# Patient Record
Sex: Female | Born: 1935
Health system: Southern US, Community
[De-identification: ages and names within clinical notes are randomized; demographics above are authoritative.]

## PROBLEM LIST (undated history)

## (undated) ENCOUNTER — Emergency Department (HOSPITAL_BASED_OUTPATIENT_CLINIC_OR_DEPARTMENT_OTHER): Admission: EM | Payer: Medicare Other | Source: Home / Self Care

## (undated) DIAGNOSIS — I272 Pulmonary hypertension, unspecified: Secondary | ICD-10-CM

## (undated) DIAGNOSIS — I10 Essential (primary) hypertension: Secondary | ICD-10-CM

## (undated) DIAGNOSIS — M8580 Other specified disorders of bone density and structure, unspecified site: Secondary | ICD-10-CM

## (undated) DIAGNOSIS — E669 Obesity, unspecified: Secondary | ICD-10-CM

## (undated) DIAGNOSIS — IMO0001 Reserved for inherently not codable concepts without codable children: Secondary | ICD-10-CM

## (undated) DIAGNOSIS — E039 Hypothyroidism, unspecified: Secondary | ICD-10-CM

## (undated) DIAGNOSIS — I2510 Atherosclerotic heart disease of native coronary artery without angina pectoris: Secondary | ICD-10-CM

## (undated) DIAGNOSIS — Z5189 Encounter for other specified aftercare: Secondary | ICD-10-CM

## (undated) DIAGNOSIS — I50810 Right heart failure, unspecified: Secondary | ICD-10-CM

## (undated) DIAGNOSIS — K7581 Nonalcoholic steatohepatitis (NASH): Secondary | ICD-10-CM

## (undated) DIAGNOSIS — K7460 Unspecified cirrhosis of liver: Secondary | ICD-10-CM

## (undated) DIAGNOSIS — E785 Hyperlipidemia, unspecified: Secondary | ICD-10-CM

## (undated) DIAGNOSIS — E049 Nontoxic goiter, unspecified: Secondary | ICD-10-CM

## (undated) DIAGNOSIS — N183 Chronic kidney disease, stage 3 unspecified: Secondary | ICD-10-CM

## (undated) DIAGNOSIS — D696 Thrombocytopenia, unspecified: Secondary | ICD-10-CM

## (undated) DIAGNOSIS — I5032 Chronic diastolic (congestive) heart failure: Secondary | ICD-10-CM

## (undated) HISTORY — DX: Other specified disorders of bone density and structure, unspecified site: M85.80

## (undated) HISTORY — DX: Thrombocytopenia, unspecified: D69.6

## (undated) HISTORY — DX: Essential (primary) hypertension: I10

## (undated) HISTORY — PX: KNEE SURGERY: SHX244

## (undated) HISTORY — PX: ABDOMINAL HYSTERECTOMY: SHX81

## (undated) HISTORY — DX: Nonalcoholic steatohepatitis (NASH): K75.81

## (undated) HISTORY — DX: Unspecified cirrhosis of liver: K74.60

## (undated) HISTORY — DX: Hypothyroidism, unspecified: E03.9

## (undated) HISTORY — DX: Obesity, unspecified: E66.9

## (undated) HISTORY — DX: Atherosclerotic heart disease of native coronary artery without angina pectoris: I25.10

## (undated) HISTORY — DX: Pulmonary hypertension, unspecified: I27.20

## (undated) HISTORY — DX: Right heart failure, unspecified: I50.810

## (undated) HISTORY — DX: Chronic diastolic (congestive) heart failure: I50.32

## (undated) HISTORY — DX: Nontoxic goiter, unspecified: E04.9

## (undated) HISTORY — DX: Chronic kidney disease, stage 3 (moderate): N18.3

## (undated) HISTORY — DX: Chronic kidney disease, stage 3 unspecified: N18.30

## (undated) HISTORY — DX: Hyperlipidemia, unspecified: E78.5

## (undated) HISTORY — PX: HEMORRHOID SURGERY: SHX153

## (undated) HISTORY — PX: APPENDECTOMY: SHX54

---

## 1998-12-27 ENCOUNTER — Emergency Department (HOSPITAL_COMMUNITY): Admission: EM | Admit: 1998-12-27 | Discharge: 1998-12-27 | Payer: Self-pay | Admitting: Emergency Medicine

## 1999-08-04 ENCOUNTER — Encounter: Payer: Self-pay | Admitting: Urology

## 1999-08-05 HISTORY — PX: NEPHRECTOMY: SHX65

## 1999-08-10 ENCOUNTER — Inpatient Hospital Stay (HOSPITAL_COMMUNITY): Admission: RE | Admit: 1999-08-10 | Discharge: 1999-08-17 | Payer: Self-pay | Admitting: Urology

## 1999-08-10 DIAGNOSIS — C649 Malignant neoplasm of unspecified kidney, except renal pelvis: Secondary | ICD-10-CM | POA: Insufficient documentation

## 1999-11-18 ENCOUNTER — Other Ambulatory Visit: Admission: RE | Admit: 1999-11-18 | Discharge: 1999-11-18 | Payer: Self-pay | Admitting: *Deleted

## 1999-12-17 ENCOUNTER — Emergency Department (HOSPITAL_COMMUNITY): Admission: EM | Admit: 1999-12-17 | Discharge: 1999-12-17 | Payer: Self-pay | Admitting: Emergency Medicine

## 1999-12-17 ENCOUNTER — Encounter: Payer: Self-pay | Admitting: Emergency Medicine

## 2000-02-28 ENCOUNTER — Other Ambulatory Visit: Admission: RE | Admit: 2000-02-28 | Discharge: 2000-02-28 | Payer: Self-pay | Admitting: Urology

## 2000-04-13 ENCOUNTER — Encounter: Admission: RE | Admit: 2000-04-13 | Discharge: 2000-04-13 | Payer: Self-pay | Admitting: *Deleted

## 2000-05-14 ENCOUNTER — Ambulatory Visit (HOSPITAL_COMMUNITY): Admission: RE | Admit: 2000-05-14 | Discharge: 2000-05-14 | Payer: Self-pay | Admitting: Urology

## 2000-05-14 ENCOUNTER — Encounter: Payer: Self-pay | Admitting: Urology

## 2000-05-17 ENCOUNTER — Ambulatory Visit (HOSPITAL_COMMUNITY): Admission: RE | Admit: 2000-05-17 | Discharge: 2000-05-17 | Payer: Self-pay | Admitting: Urology

## 2000-05-17 ENCOUNTER — Encounter: Payer: Self-pay | Admitting: Urology

## 2000-05-30 ENCOUNTER — Emergency Department (HOSPITAL_COMMUNITY): Admission: EM | Admit: 2000-05-30 | Discharge: 2000-05-30 | Payer: Self-pay | Admitting: Emergency Medicine

## 2000-08-28 ENCOUNTER — Encounter: Admission: RE | Admit: 2000-08-28 | Discharge: 2000-08-28 | Payer: Self-pay | Admitting: Urology

## 2000-08-28 ENCOUNTER — Encounter: Payer: Self-pay | Admitting: Urology

## 2000-12-17 ENCOUNTER — Ambulatory Visit (HOSPITAL_BASED_OUTPATIENT_CLINIC_OR_DEPARTMENT_OTHER): Admission: RE | Admit: 2000-12-17 | Discharge: 2000-12-17 | Payer: Self-pay | Admitting: Orthopedic Surgery

## 2001-02-07 ENCOUNTER — Ambulatory Visit (HOSPITAL_COMMUNITY): Admission: RE | Admit: 2001-02-07 | Discharge: 2001-02-07 | Payer: Self-pay | Admitting: Orthopedic Surgery

## 2001-02-19 ENCOUNTER — Encounter: Admission: RE | Admit: 2001-02-19 | Discharge: 2001-02-19 | Payer: Self-pay | Admitting: Urology

## 2001-02-19 ENCOUNTER — Encounter: Payer: Self-pay | Admitting: Urology

## 2001-05-27 ENCOUNTER — Other Ambulatory Visit: Admission: RE | Admit: 2001-05-27 | Discharge: 2001-05-27 | Payer: Self-pay | Admitting: Gastroenterology

## 2001-05-27 ENCOUNTER — Encounter (INDEPENDENT_AMBULATORY_CARE_PROVIDER_SITE_OTHER): Payer: Self-pay

## 2001-11-06 ENCOUNTER — Ambulatory Visit (HOSPITAL_COMMUNITY): Admission: RE | Admit: 2001-11-06 | Discharge: 2001-11-06 | Payer: Self-pay | Admitting: Gastroenterology

## 2002-02-25 ENCOUNTER — Other Ambulatory Visit: Admission: RE | Admit: 2002-02-25 | Discharge: 2002-02-25 | Payer: Self-pay | Admitting: Obstetrics and Gynecology

## 2002-03-26 ENCOUNTER — Encounter: Payer: Self-pay | Admitting: Family Medicine

## 2002-03-26 ENCOUNTER — Inpatient Hospital Stay (HOSPITAL_COMMUNITY): Admission: AD | Admit: 2002-03-26 | Discharge: 2002-03-28 | Payer: Self-pay | Admitting: Family Medicine

## 2002-08-28 ENCOUNTER — Encounter: Admission: RE | Admit: 2002-08-28 | Discharge: 2002-08-28 | Payer: Self-pay | Admitting: Family Medicine

## 2002-08-28 ENCOUNTER — Encounter: Payer: Self-pay | Admitting: Family Medicine

## 2003-03-03 ENCOUNTER — Encounter: Payer: Self-pay | Admitting: Gastroenterology

## 2003-03-03 ENCOUNTER — Encounter: Admission: RE | Admit: 2003-03-03 | Discharge: 2003-03-03 | Payer: Self-pay | Admitting: Gastroenterology

## 2003-03-24 ENCOUNTER — Encounter: Admission: RE | Admit: 2003-03-24 | Discharge: 2003-03-24 | Payer: Self-pay | Admitting: Urology

## 2003-03-24 ENCOUNTER — Encounter: Payer: Self-pay | Admitting: Urology

## 2003-06-15 ENCOUNTER — Emergency Department (HOSPITAL_COMMUNITY): Admission: EM | Admit: 2003-06-15 | Discharge: 2003-06-16 | Payer: Self-pay | Admitting: Emergency Medicine

## 2003-08-19 ENCOUNTER — Other Ambulatory Visit: Admission: RE | Admit: 2003-08-19 | Discharge: 2003-08-19 | Payer: Self-pay | Admitting: Obstetrics and Gynecology

## 2003-09-01 ENCOUNTER — Encounter (HOSPITAL_BASED_OUTPATIENT_CLINIC_OR_DEPARTMENT_OTHER): Admission: RE | Admit: 2003-09-01 | Discharge: 2003-09-04 | Payer: Self-pay | Admitting: Internal Medicine

## 2003-10-19 ENCOUNTER — Encounter: Admission: RE | Admit: 2003-10-19 | Discharge: 2003-10-19 | Payer: Self-pay | Admitting: Urology

## 2003-10-27 ENCOUNTER — Encounter: Admission: RE | Admit: 2003-10-27 | Discharge: 2003-10-27 | Payer: Self-pay | Admitting: Family Medicine

## 2003-12-04 ENCOUNTER — Encounter (HOSPITAL_BASED_OUTPATIENT_CLINIC_OR_DEPARTMENT_OTHER): Admission: RE | Admit: 2003-12-04 | Discharge: 2003-12-25 | Payer: Self-pay | Admitting: Internal Medicine

## 2004-02-07 ENCOUNTER — Inpatient Hospital Stay (HOSPITAL_COMMUNITY): Admission: EM | Admit: 2004-02-07 | Discharge: 2004-02-07 | Payer: Self-pay | Admitting: Emergency Medicine

## 2004-02-29 ENCOUNTER — Ambulatory Visit (HOSPITAL_COMMUNITY): Admission: RE | Admit: 2004-02-29 | Discharge: 2004-02-29 | Payer: Self-pay | Admitting: Gastroenterology

## 2004-03-23 ENCOUNTER — Encounter (HOSPITAL_BASED_OUTPATIENT_CLINIC_OR_DEPARTMENT_OTHER): Admission: RE | Admit: 2004-03-23 | Discharge: 2004-04-01 | Payer: Self-pay | Admitting: Internal Medicine

## 2004-06-08 ENCOUNTER — Emergency Department (HOSPITAL_COMMUNITY): Admission: AC | Admit: 2004-06-08 | Discharge: 2004-06-09 | Payer: Self-pay | Admitting: Emergency Medicine

## 2004-06-24 ENCOUNTER — Encounter (HOSPITAL_BASED_OUTPATIENT_CLINIC_OR_DEPARTMENT_OTHER): Admission: RE | Admit: 2004-06-24 | Discharge: 2004-07-15 | Payer: Self-pay | Admitting: Internal Medicine

## 2004-07-27 ENCOUNTER — Encounter (INDEPENDENT_AMBULATORY_CARE_PROVIDER_SITE_OTHER): Payer: Self-pay | Admitting: Specialist

## 2004-07-27 ENCOUNTER — Ambulatory Visit (HOSPITAL_COMMUNITY): Admission: RE | Admit: 2004-07-27 | Discharge: 2004-07-27 | Payer: Self-pay | Admitting: Gastroenterology

## 2004-10-05 ENCOUNTER — Encounter (HOSPITAL_BASED_OUTPATIENT_CLINIC_OR_DEPARTMENT_OTHER): Admission: RE | Admit: 2004-10-05 | Discharge: 2004-10-14 | Payer: Self-pay | Admitting: Internal Medicine

## 2005-01-03 ENCOUNTER — Inpatient Hospital Stay (HOSPITAL_COMMUNITY): Admission: EM | Admit: 2005-01-03 | Discharge: 2005-01-05 | Payer: Self-pay | Admitting: Emergency Medicine

## 2005-01-03 ENCOUNTER — Ambulatory Visit: Payer: Self-pay | Admitting: Cardiology

## 2005-01-10 ENCOUNTER — Encounter (HOSPITAL_BASED_OUTPATIENT_CLINIC_OR_DEPARTMENT_OTHER): Admission: RE | Admit: 2005-01-10 | Discharge: 2005-02-01 | Payer: Self-pay | Admitting: Internal Medicine

## 2005-01-12 ENCOUNTER — Ambulatory Visit: Payer: Self-pay

## 2005-01-25 ENCOUNTER — Ambulatory Visit: Payer: Self-pay | Admitting: Family Medicine

## 2005-03-29 ENCOUNTER — Ambulatory Visit: Payer: Self-pay | Admitting: Cardiology

## 2005-04-04 ENCOUNTER — Ambulatory Visit: Admission: RE | Admit: 2005-04-04 | Discharge: 2005-04-04 | Payer: Self-pay | Admitting: Cardiology

## 2005-04-14 ENCOUNTER — Ambulatory Visit: Payer: Self-pay

## 2005-04-19 ENCOUNTER — Ambulatory Visit: Payer: Self-pay | Admitting: Family Medicine

## 2005-05-24 ENCOUNTER — Encounter: Admission: RE | Admit: 2005-05-24 | Discharge: 2005-05-24 | Payer: Self-pay | Admitting: Gastroenterology

## 2005-06-07 ENCOUNTER — Ambulatory Visit: Payer: Self-pay | Admitting: Critical Care Medicine

## 2005-07-05 ENCOUNTER — Ambulatory Visit: Payer: Self-pay | Admitting: Family Medicine

## 2005-07-20 ENCOUNTER — Ambulatory Visit: Payer: Self-pay | Admitting: Family Medicine

## 2005-08-23 ENCOUNTER — Other Ambulatory Visit: Admission: RE | Admit: 2005-08-23 | Discharge: 2005-08-23 | Payer: Self-pay | Admitting: Obstetrics and Gynecology

## 2005-10-03 ENCOUNTER — Ambulatory Visit: Payer: Self-pay | Admitting: Family Medicine

## 2005-10-12 ENCOUNTER — Ambulatory Visit: Payer: Self-pay | Admitting: Family Medicine

## 2005-10-17 ENCOUNTER — Ambulatory Visit: Payer: Self-pay

## 2006-02-08 ENCOUNTER — Encounter: Admission: RE | Admit: 2006-02-08 | Discharge: 2006-02-08 | Payer: Self-pay | Admitting: Gastroenterology

## 2006-02-10 ENCOUNTER — Emergency Department (HOSPITAL_COMMUNITY): Admission: EM | Admit: 2006-02-10 | Discharge: 2006-02-10 | Payer: Self-pay | Admitting: Emergency Medicine

## 2006-02-13 ENCOUNTER — Encounter: Admission: RE | Admit: 2006-02-13 | Discharge: 2006-02-13 | Payer: Self-pay | Admitting: Gastroenterology

## 2006-02-22 ENCOUNTER — Encounter: Admission: RE | Admit: 2006-02-22 | Discharge: 2006-02-22 | Payer: Self-pay | Admitting: Gastroenterology

## 2006-02-23 ENCOUNTER — Ambulatory Visit: Payer: Self-pay | Admitting: Family Medicine

## 2006-02-27 ENCOUNTER — Ambulatory Visit: Payer: Self-pay | Admitting: Family Medicine

## 2006-03-01 ENCOUNTER — Encounter: Admission: RE | Admit: 2006-03-01 | Discharge: 2006-03-01 | Payer: Self-pay | Admitting: Specialist

## 2006-03-20 ENCOUNTER — Ambulatory Visit: Payer: Self-pay | Admitting: Family Medicine

## 2006-03-29 ENCOUNTER — Encounter: Admission: RE | Admit: 2006-03-29 | Discharge: 2006-03-29 | Payer: Self-pay | Admitting: Urology

## 2006-04-03 ENCOUNTER — Encounter: Payer: Self-pay | Admitting: Family Medicine

## 2006-06-18 ENCOUNTER — Encounter: Payer: Self-pay | Admitting: Family Medicine

## 2006-07-25 ENCOUNTER — Ambulatory Visit: Payer: Self-pay | Admitting: Family Medicine

## 2006-08-01 ENCOUNTER — Ambulatory Visit: Payer: Self-pay | Admitting: Family Medicine

## 2006-11-07 ENCOUNTER — Ambulatory Visit: Payer: Self-pay | Admitting: Family Medicine

## 2007-03-19 ENCOUNTER — Ambulatory Visit: Payer: Self-pay | Admitting: Family Medicine

## 2007-03-28 ENCOUNTER — Ambulatory Visit: Payer: Self-pay | Admitting: Family Medicine

## 2007-03-28 LAB — CONVERTED CEMR LAB
ALT: 53 units/L — ABNORMAL HIGH (ref 0–40)
Albumin: 3.7 g/dL (ref 3.5–5.2)
BUN: 19 mg/dL (ref 6–23)
Bilirubin, Direct: 0.1 mg/dL (ref 0.0–0.3)
Chloride: 105 meq/L (ref 96–112)
GFR calc non Af Amer: 76 mL/min
Hgb A1c MFr Bld: 8.5 % — ABNORMAL HIGH (ref 4.6–6.0)
Microalb Creat Ratio: 79.7 mg/g — ABNORMAL HIGH (ref 0.0–30.0)
Potassium: 3.8 meq/L (ref 3.5–5.1)
TSH: 0.59 microintl units/mL (ref 0.35–5.50)

## 2007-04-23 DIAGNOSIS — M949 Disorder of cartilage, unspecified: Secondary | ICD-10-CM

## 2007-04-23 DIAGNOSIS — E119 Type 2 diabetes mellitus without complications: Secondary | ICD-10-CM | POA: Insufficient documentation

## 2007-04-23 DIAGNOSIS — M899 Disorder of bone, unspecified: Secondary | ICD-10-CM | POA: Insufficient documentation

## 2007-04-23 DIAGNOSIS — N200 Calculus of kidney: Secondary | ICD-10-CM | POA: Insufficient documentation

## 2007-04-23 DIAGNOSIS — J45909 Unspecified asthma, uncomplicated: Secondary | ICD-10-CM | POA: Insufficient documentation

## 2007-04-23 DIAGNOSIS — E049 Nontoxic goiter, unspecified: Secondary | ICD-10-CM | POA: Insufficient documentation

## 2007-05-03 ENCOUNTER — Encounter: Payer: Self-pay | Admitting: Family Medicine

## 2007-05-07 ENCOUNTER — Encounter: Payer: Self-pay | Admitting: Family Medicine

## 2007-05-27 ENCOUNTER — Encounter: Payer: Self-pay | Admitting: Family Medicine

## 2007-07-22 ENCOUNTER — Telehealth (INDEPENDENT_AMBULATORY_CARE_PROVIDER_SITE_OTHER): Payer: Self-pay | Admitting: *Deleted

## 2007-07-22 ENCOUNTER — Ambulatory Visit: Payer: Self-pay | Admitting: Family Medicine

## 2007-07-22 DIAGNOSIS — N644 Mastodynia: Secondary | ICD-10-CM | POA: Insufficient documentation

## 2007-07-22 DIAGNOSIS — R109 Unspecified abdominal pain: Secondary | ICD-10-CM | POA: Insufficient documentation

## 2007-07-23 ENCOUNTER — Encounter: Payer: Self-pay | Admitting: Family Medicine

## 2007-07-29 ENCOUNTER — Telehealth (INDEPENDENT_AMBULATORY_CARE_PROVIDER_SITE_OTHER): Payer: Self-pay | Admitting: *Deleted

## 2007-07-31 ENCOUNTER — Encounter: Payer: Self-pay | Admitting: Family Medicine

## 2007-07-31 ENCOUNTER — Encounter (INDEPENDENT_AMBULATORY_CARE_PROVIDER_SITE_OTHER): Payer: Self-pay | Admitting: *Deleted

## 2007-07-31 LAB — CONVERTED CEMR LAB
AST: 77 units/L — ABNORMAL HIGH (ref 0–37)
Alkaline Phosphatase: 83 units/L (ref 39–117)
Amylase: 49 units/L (ref 27–131)
BUN: 19 mg/dL (ref 6–23)
Basophils Relative: 0.4 % (ref 0.0–1.0)
CO2: 29 meq/L (ref 19–32)
Calcium: 8.7 mg/dL (ref 8.4–10.5)
Eosinophils Absolute: 0.1 10*3/uL (ref 0.0–0.6)
GFR calc non Af Amer: 58 mL/min
Glucose, Bld: 211 mg/dL — ABNORMAL HIGH (ref 70–99)
HCT: 45.5 % (ref 36.0–46.0)
Lipase: 18 units/L (ref 11.0–59.0)
MCHC: 34 g/dL (ref 30.0–36.0)
Monocytes Absolute: 0.3 10*3/uL (ref 0.2–0.7)
Neutro Abs: 5.7 10*3/uL (ref 1.4–7.7)
Neutrophils Relative %: 77.8 % — ABNORMAL HIGH (ref 43.0–77.0)
Platelets: 157 10*3/uL (ref 150–400)
Sodium: 140 meq/L (ref 135–145)
Total Protein: 6.6 g/dL (ref 6.0–8.3)
WBC: 7.3 10*3/uL (ref 4.5–10.5)

## 2007-08-01 ENCOUNTER — Ambulatory Visit: Payer: Self-pay | Admitting: Family Medicine

## 2007-08-06 LAB — CONVERTED CEMR LAB
HCV Ab: NEGATIVE
Hep A IgM: NEGATIVE
Hep B C IgM: NEGATIVE
Hepatitis B Surface Ag: NEGATIVE

## 2007-08-08 ENCOUNTER — Telehealth (INDEPENDENT_AMBULATORY_CARE_PROVIDER_SITE_OTHER): Payer: Self-pay | Admitting: *Deleted

## 2007-08-08 ENCOUNTER — Encounter: Payer: Self-pay | Admitting: Family Medicine

## 2007-08-09 ENCOUNTER — Telehealth: Payer: Self-pay | Admitting: Family Medicine

## 2007-08-09 LAB — CONVERTED CEMR LAB
AST: 50 units/L — ABNORMAL HIGH (ref 0–37)
Albumin: 3.5 g/dL (ref 3.5–5.2)
Bilirubin, Direct: 0.1 mg/dL (ref 0.0–0.3)

## 2007-08-12 ENCOUNTER — Telehealth (INDEPENDENT_AMBULATORY_CARE_PROVIDER_SITE_OTHER): Payer: Self-pay | Admitting: *Deleted

## 2007-08-13 ENCOUNTER — Encounter: Payer: Self-pay | Admitting: Family Medicine

## 2007-08-17 ENCOUNTER — Encounter: Admission: RE | Admit: 2007-08-17 | Discharge: 2007-08-17 | Payer: Self-pay | Admitting: Neurosurgery

## 2007-08-17 ENCOUNTER — Encounter: Payer: Self-pay | Admitting: Family Medicine

## 2007-08-26 ENCOUNTER — Ambulatory Visit: Payer: Self-pay | Admitting: Family Medicine

## 2007-08-27 ENCOUNTER — Encounter: Payer: Self-pay | Admitting: Family Medicine

## 2007-09-02 ENCOUNTER — Telehealth (INDEPENDENT_AMBULATORY_CARE_PROVIDER_SITE_OTHER): Payer: Self-pay | Admitting: *Deleted

## 2007-09-02 DIAGNOSIS — R209 Unspecified disturbances of skin sensation: Secondary | ICD-10-CM | POA: Insufficient documentation

## 2007-09-03 ENCOUNTER — Encounter: Payer: Self-pay | Admitting: Family Medicine

## 2007-09-11 ENCOUNTER — Encounter: Payer: Self-pay | Admitting: Family Medicine

## 2007-09-18 ENCOUNTER — Encounter: Admission: RE | Admit: 2007-09-18 | Discharge: 2007-09-18 | Payer: Self-pay | Admitting: Gastroenterology

## 2007-09-18 ENCOUNTER — Encounter: Payer: Self-pay | Admitting: Family Medicine

## 2007-09-20 ENCOUNTER — Ambulatory Visit: Payer: Self-pay | Admitting: Family Medicine

## 2007-09-25 ENCOUNTER — Encounter: Payer: Self-pay | Admitting: Family Medicine

## 2007-09-25 LAB — CONVERTED CEMR LAB
ALT: 38 units/L — ABNORMAL HIGH (ref 0–35)
AST: 45 units/L — ABNORMAL HIGH (ref 0–37)
Albumin: 3.6 g/dL (ref 3.5–5.2)
Cholesterol: 115 mg/dL (ref 0–200)
GFR calc Af Amer: 106 mL/min
GFR calc non Af Amer: 88 mL/min
Glucose, Bld: 106 mg/dL — ABNORMAL HIGH (ref 70–99)
HDL: 34.2 mg/dL — ABNORMAL LOW (ref >39.0)
LDL Cholesterol: 56 mg/dL (ref 0–99)
Potassium: 3.8 meq/L (ref 3.5–5.1)
Sodium: 144 meq/L (ref 135–145)
Total CHOL/HDL Ratio: 3.4
Total Protein: 6.8 g/dL (ref 6.0–8.3)
VLDL: 25 mg/dL (ref 0–40)

## 2007-10-18 ENCOUNTER — Encounter: Payer: Self-pay | Admitting: Family Medicine

## 2007-10-23 ENCOUNTER — Encounter: Payer: Self-pay | Admitting: Family Medicine

## 2007-10-23 ENCOUNTER — Telehealth (INDEPENDENT_AMBULATORY_CARE_PROVIDER_SITE_OTHER): Payer: Self-pay | Admitting: *Deleted

## 2007-10-24 ENCOUNTER — Encounter: Payer: Self-pay | Admitting: Family Medicine

## 2007-11-04 ENCOUNTER — Telehealth (INDEPENDENT_AMBULATORY_CARE_PROVIDER_SITE_OTHER): Payer: Self-pay | Admitting: *Deleted

## 2007-11-04 ENCOUNTER — Encounter: Payer: Self-pay | Admitting: Family Medicine

## 2007-11-05 ENCOUNTER — Ambulatory Visit: Payer: Self-pay | Admitting: Family Medicine

## 2007-11-05 DIAGNOSIS — S61209A Unspecified open wound of unspecified finger without damage to nail, initial encounter: Secondary | ICD-10-CM | POA: Insufficient documentation

## 2007-11-06 ENCOUNTER — Encounter: Payer: Self-pay | Admitting: Family Medicine

## 2007-11-13 ENCOUNTER — Encounter: Payer: Self-pay | Admitting: Family Medicine

## 2008-01-30 ENCOUNTER — Telehealth (INDEPENDENT_AMBULATORY_CARE_PROVIDER_SITE_OTHER): Payer: Self-pay | Admitting: *Deleted

## 2008-02-19 ENCOUNTER — Telehealth (INDEPENDENT_AMBULATORY_CARE_PROVIDER_SITE_OTHER): Payer: Self-pay | Admitting: *Deleted

## 2008-03-26 ENCOUNTER — Ambulatory Visit: Payer: Self-pay | Admitting: Family Medicine

## 2008-03-26 ENCOUNTER — Encounter (INDEPENDENT_AMBULATORY_CARE_PROVIDER_SITE_OTHER): Payer: Self-pay | Admitting: *Deleted

## 2008-03-31 ENCOUNTER — Encounter: Payer: Self-pay | Admitting: Family Medicine

## 2008-04-07 ENCOUNTER — Encounter (INDEPENDENT_AMBULATORY_CARE_PROVIDER_SITE_OTHER): Payer: Self-pay | Admitting: *Deleted

## 2008-04-13 ENCOUNTER — Encounter: Payer: Self-pay | Admitting: Family Medicine

## 2008-04-23 ENCOUNTER — Encounter: Payer: Self-pay | Admitting: Family Medicine

## 2008-05-01 ENCOUNTER — Encounter: Payer: Self-pay | Admitting: Family Medicine

## 2008-05-01 ENCOUNTER — Encounter: Admission: RE | Admit: 2008-05-01 | Discharge: 2008-05-01 | Payer: Self-pay | Admitting: Gastroenterology

## 2008-05-13 ENCOUNTER — Telehealth (INDEPENDENT_AMBULATORY_CARE_PROVIDER_SITE_OTHER): Payer: Self-pay | Admitting: *Deleted

## 2008-05-18 ENCOUNTER — Encounter: Payer: Self-pay | Admitting: Family Medicine

## 2008-05-25 ENCOUNTER — Telehealth (INDEPENDENT_AMBULATORY_CARE_PROVIDER_SITE_OTHER): Payer: Self-pay | Admitting: *Deleted

## 2008-06-02 ENCOUNTER — Telehealth (INDEPENDENT_AMBULATORY_CARE_PROVIDER_SITE_OTHER): Payer: Self-pay | Admitting: *Deleted

## 2008-07-25 ENCOUNTER — Emergency Department (HOSPITAL_COMMUNITY): Admission: EM | Admit: 2008-07-25 | Discharge: 2008-07-25 | Payer: Self-pay | Admitting: Emergency Medicine

## 2008-07-28 ENCOUNTER — Encounter: Payer: Self-pay | Admitting: Family Medicine

## 2008-08-04 ENCOUNTER — Ambulatory Visit (HOSPITAL_COMMUNITY): Admission: RE | Admit: 2008-08-04 | Discharge: 2008-08-04 | Payer: Self-pay | Admitting: Urology

## 2008-08-31 ENCOUNTER — Telehealth (INDEPENDENT_AMBULATORY_CARE_PROVIDER_SITE_OTHER): Payer: Self-pay | Admitting: *Deleted

## 2008-09-02 ENCOUNTER — Encounter: Payer: Self-pay | Admitting: Family Medicine

## 2008-09-04 ENCOUNTER — Ambulatory Visit: Payer: Self-pay | Admitting: Family Medicine

## 2008-09-04 DIAGNOSIS — M542 Cervicalgia: Secondary | ICD-10-CM | POA: Insufficient documentation

## 2008-09-08 ENCOUNTER — Telehealth (INDEPENDENT_AMBULATORY_CARE_PROVIDER_SITE_OTHER): Payer: Self-pay | Admitting: *Deleted

## 2008-10-08 ENCOUNTER — Ambulatory Visit: Payer: Self-pay | Admitting: Family Medicine

## 2008-10-09 ENCOUNTER — Encounter (INDEPENDENT_AMBULATORY_CARE_PROVIDER_SITE_OTHER): Payer: Self-pay | Admitting: *Deleted

## 2008-10-09 ENCOUNTER — Telehealth (INDEPENDENT_AMBULATORY_CARE_PROVIDER_SITE_OTHER): Payer: Self-pay | Admitting: *Deleted

## 2008-11-13 ENCOUNTER — Encounter: Payer: Self-pay | Admitting: Family Medicine

## 2008-11-18 ENCOUNTER — Telehealth (INDEPENDENT_AMBULATORY_CARE_PROVIDER_SITE_OTHER): Payer: Self-pay | Admitting: *Deleted

## 2008-11-25 ENCOUNTER — Telehealth (INDEPENDENT_AMBULATORY_CARE_PROVIDER_SITE_OTHER): Payer: Self-pay | Admitting: *Deleted

## 2008-12-16 ENCOUNTER — Encounter: Payer: Self-pay | Admitting: Family Medicine

## 2008-12-23 ENCOUNTER — Encounter: Payer: Self-pay | Admitting: Family Medicine

## 2008-12-28 ENCOUNTER — Encounter: Payer: Self-pay | Admitting: Family Medicine

## 2009-01-07 ENCOUNTER — Encounter: Payer: Self-pay | Admitting: Family Medicine

## 2009-04-06 ENCOUNTER — Telehealth (INDEPENDENT_AMBULATORY_CARE_PROVIDER_SITE_OTHER): Payer: Self-pay | Admitting: *Deleted

## 2009-04-12 ENCOUNTER — Telehealth (INDEPENDENT_AMBULATORY_CARE_PROVIDER_SITE_OTHER): Payer: Self-pay | Admitting: *Deleted

## 2009-04-27 ENCOUNTER — Encounter: Payer: Self-pay | Admitting: Family Medicine

## 2009-06-25 ENCOUNTER — Encounter: Payer: Self-pay | Admitting: Cardiovascular Disease

## 2009-07-01 ENCOUNTER — Encounter: Payer: Self-pay | Admitting: Cardiovascular Disease

## 2009-07-01 ENCOUNTER — Ambulatory Visit: Payer: Self-pay

## 2009-07-08 ENCOUNTER — Telehealth (INDEPENDENT_AMBULATORY_CARE_PROVIDER_SITE_OTHER): Payer: Self-pay | Admitting: *Deleted

## 2009-07-23 ENCOUNTER — Encounter: Payer: Self-pay | Admitting: Family Medicine

## 2009-09-15 ENCOUNTER — Telehealth (INDEPENDENT_AMBULATORY_CARE_PROVIDER_SITE_OTHER): Payer: Self-pay | Admitting: *Deleted

## 2009-09-17 ENCOUNTER — Ambulatory Visit: Payer: Self-pay | Admitting: Family Medicine

## 2009-09-17 DIAGNOSIS — M545 Low back pain, unspecified: Secondary | ICD-10-CM | POA: Insufficient documentation

## 2009-09-27 ENCOUNTER — Ambulatory Visit: Payer: Self-pay | Admitting: Family Medicine

## 2009-09-28 ENCOUNTER — Telehealth (INDEPENDENT_AMBULATORY_CARE_PROVIDER_SITE_OTHER): Payer: Self-pay | Admitting: *Deleted

## 2009-10-06 ENCOUNTER — Encounter: Payer: Self-pay | Admitting: Family Medicine

## 2009-11-09 ENCOUNTER — Encounter: Payer: Self-pay | Admitting: Family Medicine

## 2009-12-01 ENCOUNTER — Telehealth: Payer: Self-pay | Admitting: Internal Medicine

## 2009-12-02 ENCOUNTER — Ambulatory Visit: Payer: Self-pay | Admitting: Family Medicine

## 2009-12-02 DIAGNOSIS — R109 Unspecified abdominal pain: Secondary | ICD-10-CM | POA: Insufficient documentation

## 2009-12-02 DIAGNOSIS — Z905 Acquired absence of kidney: Secondary | ICD-10-CM | POA: Insufficient documentation

## 2009-12-06 ENCOUNTER — Telehealth: Payer: Self-pay | Admitting: Family Medicine

## 2009-12-06 LAB — CONVERTED CEMR LAB
AST: 41 units/L — ABNORMAL HIGH (ref 0–37)
Albumin: 3.7 g/dL (ref 3.5–5.2)
Alkaline Phosphatase: 96 units/L (ref 39–117)
BUN: 13 mg/dL (ref 6–23)
CO2: 30 meq/L (ref 19–32)
Calcium: 9.2 mg/dL (ref 8.4–10.5)
Eosinophils Absolute: 0.1 10*3/uL (ref 0.0–0.7)
Eosinophils Relative: 1.3 % (ref 0.0–5.0)
HCT: 46 % (ref 36.0–46.0)
Hemoglobin: 15.3 g/dL — ABNORMAL HIGH (ref 12.0–15.0)
Lymphocytes Relative: 24.6 % (ref 12.0–46.0)
Neutro Abs: 4.6 10*3/uL (ref 1.4–7.7)
Platelets: 120 10*3/uL — ABNORMAL LOW (ref 150.0–400.0)
Potassium: 4 meq/L (ref 3.5–5.1)
RBC: 5.03 M/uL (ref 3.87–5.11)
RDW: 13 % (ref 11.5–14.6)
Sodium: 140 meq/L (ref 135–145)
Total Bilirubin: 0.8 mg/dL (ref 0.3–1.2)
WBC: 7.3 10*3/uL (ref 4.5–10.5)

## 2009-12-07 ENCOUNTER — Encounter: Admission: RE | Admit: 2009-12-07 | Discharge: 2009-12-07 | Payer: Self-pay | Admitting: Family Medicine

## 2009-12-08 ENCOUNTER — Telehealth (INDEPENDENT_AMBULATORY_CARE_PROVIDER_SITE_OTHER): Payer: Self-pay | Admitting: *Deleted

## 2009-12-09 ENCOUNTER — Encounter: Payer: Self-pay | Admitting: Family Medicine

## 2010-01-14 ENCOUNTER — Telehealth: Payer: Self-pay | Admitting: Family Medicine

## 2010-01-17 ENCOUNTER — Encounter: Payer: Self-pay | Admitting: Family Medicine

## 2010-01-18 ENCOUNTER — Ambulatory Visit: Payer: Self-pay | Admitting: Family Medicine

## 2010-01-20 ENCOUNTER — Encounter: Payer: Self-pay | Admitting: Family Medicine

## 2010-01-25 ENCOUNTER — Encounter: Payer: Self-pay | Admitting: Family Medicine

## 2010-01-27 ENCOUNTER — Ambulatory Visit: Payer: Self-pay | Admitting: Family Medicine

## 2010-01-27 DIAGNOSIS — M109 Gout, unspecified: Secondary | ICD-10-CM | POA: Insufficient documentation

## 2010-01-27 DIAGNOSIS — M25579 Pain in unspecified ankle and joints of unspecified foot: Secondary | ICD-10-CM | POA: Insufficient documentation

## 2010-01-31 ENCOUNTER — Telehealth: Payer: Self-pay | Admitting: Family Medicine

## 2010-02-03 ENCOUNTER — Encounter: Payer: Self-pay | Admitting: Family Medicine

## 2010-02-07 ENCOUNTER — Ambulatory Visit: Payer: Self-pay | Admitting: Family Medicine

## 2010-02-08 ENCOUNTER — Encounter: Payer: Self-pay | Admitting: Family Medicine

## 2010-03-15 ENCOUNTER — Encounter: Payer: Self-pay | Admitting: Family Medicine

## 2010-03-31 ENCOUNTER — Encounter: Payer: Self-pay | Admitting: Family Medicine

## 2010-04-06 ENCOUNTER — Telehealth (INDEPENDENT_AMBULATORY_CARE_PROVIDER_SITE_OTHER): Payer: Self-pay | Admitting: *Deleted

## 2010-04-08 ENCOUNTER — Encounter: Payer: Self-pay | Admitting: Family Medicine

## 2010-04-12 ENCOUNTER — Telehealth: Payer: Self-pay | Admitting: Family Medicine

## 2010-05-11 ENCOUNTER — Encounter: Payer: Self-pay | Admitting: Family Medicine

## 2010-05-11 ENCOUNTER — Encounter: Admission: RE | Admit: 2010-05-11 | Discharge: 2010-05-11 | Payer: Self-pay | Admitting: Gastroenterology

## 2010-06-02 ENCOUNTER — Encounter: Payer: Self-pay | Admitting: Family Medicine

## 2010-06-02 ENCOUNTER — Ambulatory Visit (HOSPITAL_COMMUNITY): Admission: RE | Admit: 2010-06-02 | Discharge: 2010-06-02 | Payer: Self-pay | Admitting: Interventional Radiology

## 2010-06-21 ENCOUNTER — Ambulatory Visit: Payer: Self-pay | Admitting: Family Medicine

## 2010-06-21 DIAGNOSIS — I10 Essential (primary) hypertension: Secondary | ICD-10-CM | POA: Insufficient documentation

## 2010-06-22 ENCOUNTER — Encounter: Admission: RE | Admit: 2010-06-22 | Discharge: 2010-06-22 | Payer: Self-pay | Admitting: Interventional Radiology

## 2010-07-04 ENCOUNTER — Ambulatory Visit: Payer: Self-pay | Admitting: Family Medicine

## 2010-07-04 DIAGNOSIS — R42 Dizziness and giddiness: Secondary | ICD-10-CM | POA: Insufficient documentation

## 2010-07-13 ENCOUNTER — Telehealth: Payer: Self-pay | Admitting: Family Medicine

## 2010-07-13 ENCOUNTER — Ambulatory Visit: Payer: Self-pay | Admitting: Family Medicine

## 2010-07-19 LAB — CONVERTED CEMR LAB
Albumin: 3.9 g/dL (ref 3.5–5.2)
Alkaline Phosphatase: 92 units/L (ref 39–117)
Basophils Absolute: 0 10*3/uL (ref 0.0–0.1)
Basophils Relative: 0.6 % (ref 0.0–3.0)
CO2: 30 meq/L (ref 19–32)
Calcium: 8.9 mg/dL (ref 8.4–10.5)
Cholesterol: 115 mg/dL (ref 0–200)
Eosinophils Relative: 2.1 % (ref 0.0–5.0)
GFR calc non Af Amer: 83.02 mL/min (ref >60)
HCT: 45.3 % (ref 36.0–46.0)
HDL: 35 mg/dL — ABNORMAL LOW (ref >39.00)
Hgb A1c MFr Bld: 9.5 % — ABNORMAL HIGH (ref 4.6–6.5)
Lymphocytes Relative: 27.6 % (ref 12.0–46.0)
Lymphs Abs: 1.7 10*3/uL (ref 0.7–4.0)
Monocytes Absolute: 0.5 10*3/uL (ref 0.1–1.0)
Monocytes Relative: 8 % (ref 3.0–12.0)
Neutro Abs: 3.9 10*3/uL (ref 1.4–7.7)
Potassium: 3.8 meq/L (ref 3.5–5.1)
Total CHOL/HDL Ratio: 3
Total Protein: 6.4 g/dL (ref 6.0–8.3)
WBC: 6.3 10*3/uL (ref 4.5–10.5)

## 2010-07-22 ENCOUNTER — Ambulatory Visit: Payer: Self-pay | Admitting: Family Medicine

## 2010-07-22 DIAGNOSIS — E538 Deficiency of other specified B group vitamins: Secondary | ICD-10-CM | POA: Insufficient documentation

## 2010-07-29 ENCOUNTER — Ambulatory Visit: Payer: Self-pay | Admitting: Family Medicine

## 2010-08-05 ENCOUNTER — Ambulatory Visit: Payer: Self-pay | Admitting: Family Medicine

## 2010-08-12 ENCOUNTER — Ambulatory Visit: Payer: Self-pay | Admitting: Family Medicine

## 2010-08-16 ENCOUNTER — Telehealth: Payer: Self-pay | Admitting: Family Medicine

## 2010-08-16 DIAGNOSIS — N63 Unspecified lump in unspecified breast: Secondary | ICD-10-CM | POA: Insufficient documentation

## 2010-08-23 ENCOUNTER — Encounter: Payer: Self-pay | Admitting: Family Medicine

## 2010-08-26 ENCOUNTER — Encounter: Payer: Self-pay | Admitting: Family Medicine

## 2010-08-30 ENCOUNTER — Encounter: Payer: Self-pay | Admitting: Family Medicine

## 2010-09-08 ENCOUNTER — Ambulatory Visit: Payer: Self-pay | Admitting: Family Medicine

## 2010-09-08 DIAGNOSIS — E785 Hyperlipidemia, unspecified: Secondary | ICD-10-CM | POA: Insufficient documentation

## 2010-09-14 LAB — CONVERTED CEMR LAB
Amylase: 75 units/L (ref 27–131)
Basophils Absolute: 0 10*3/uL (ref 0.0–0.1)
Chloride: 102 meq/L (ref 96–112)
GFR calc non Af Amer: 66.02 mL/min (ref >60)
Hemoglobin: 14.8 g/dL (ref 12.0–15.0)
Monocytes Absolute: 0.8 10*3/uL (ref 0.1–1.0)
Neutro Abs: 4.5 10*3/uL (ref 1.4–7.7)
Neutrophils Relative %: 60 % (ref 43.0–77.0)
RBC: 4.74 M/uL (ref 3.87–5.11)
RDW: 14.1 % (ref 11.5–14.6)
Sodium: 138 meq/L (ref 135–145)
Vitamin B-12: 304 pg/mL (ref 211–911)
WBC: 7.6 10*3/uL (ref 4.5–10.5)

## 2010-09-19 ENCOUNTER — Ambulatory Visit: Payer: Self-pay | Admitting: Family Medicine

## 2010-09-19 DIAGNOSIS — L259 Unspecified contact dermatitis, unspecified cause: Secondary | ICD-10-CM | POA: Insufficient documentation

## 2010-09-20 LAB — CONVERTED CEMR LAB
Eosinophils Relative: 2.1 % (ref 0.0–5.0)
HCT: 45.9 % (ref 36.0–46.0)
Lymphocytes Relative: 24.9 % (ref 12.0–46.0)
Lymphs Abs: 1.7 10*3/uL (ref 0.7–4.0)
Neutro Abs: 4.3 10*3/uL (ref 1.4–7.7)
Neutrophils Relative %: 62.1 % (ref 43.0–77.0)
RBC: 5.07 M/uL (ref 3.87–5.11)
RDW: 13.8 % (ref 11.5–14.6)
WBC: 6.9 10*3/uL (ref 4.5–10.5)

## 2010-09-28 ENCOUNTER — Encounter: Payer: Self-pay | Admitting: Family Medicine

## 2010-10-10 ENCOUNTER — Ambulatory Visit: Payer: Self-pay | Admitting: Family Medicine

## 2010-11-02 ENCOUNTER — Encounter: Payer: Self-pay | Admitting: Family Medicine

## 2010-11-07 ENCOUNTER — Encounter: Payer: Self-pay | Admitting: Family Medicine

## 2010-11-07 ENCOUNTER — Ambulatory Visit: Payer: Self-pay | Admitting: Family Medicine

## 2010-11-07 ENCOUNTER — Telehealth: Payer: Self-pay | Admitting: Family Medicine

## 2010-11-07 DIAGNOSIS — J069 Acute upper respiratory infection, unspecified: Secondary | ICD-10-CM | POA: Insufficient documentation

## 2010-11-15 ENCOUNTER — Encounter
Admission: RE | Admit: 2010-11-15 | Discharge: 2010-11-15 | Payer: Self-pay | Source: Home / Self Care | Attending: Neurosurgery | Admitting: Neurosurgery

## 2010-11-17 ENCOUNTER — Encounter: Payer: Self-pay | Admitting: Family Medicine

## 2010-11-18 ENCOUNTER — Encounter: Payer: Self-pay | Admitting: Family Medicine

## 2010-12-02 ENCOUNTER — Encounter
Admission: RE | Admit: 2010-12-02 | Discharge: 2010-12-02 | Payer: Self-pay | Source: Home / Self Care | Attending: Neurology | Admitting: Neurology

## 2010-12-07 ENCOUNTER — Encounter: Payer: Self-pay | Admitting: Family Medicine

## 2010-12-19 ENCOUNTER — Telehealth (INDEPENDENT_AMBULATORY_CARE_PROVIDER_SITE_OTHER): Payer: Self-pay | Admitting: *Deleted

## 2010-12-21 ENCOUNTER — Encounter: Admission: RE | Admit: 2010-12-21 | Payer: Self-pay | Source: Home / Self Care | Admitting: Neurosurgery

## 2010-12-25 ENCOUNTER — Encounter: Payer: Self-pay | Admitting: Family Medicine

## 2011-01-01 LAB — CONVERTED CEMR LAB
AST: 37 units/L (ref 0–37)
Albumin: 3.7 g/dL (ref 3.5–5.2)
Calcium: 8.8 mg/dL (ref 8.4–10.5)
Creatinine, Ser: 0.7 mg/dL (ref 0.4–1.2)
GFR calc non Af Amer: 87.25 mL/min (ref >60)
Potassium: 3.8 meq/L (ref 3.5–5.1)
Sodium: 136 meq/L (ref 135–145)
Total Bilirubin: 0.8 mg/dL (ref 0.3–1.2)
Total Protein: 7.2 g/dL (ref 6.0–8.3)
Uric Acid, Serum: 3.2 mg/dL (ref 2.4–7.0)

## 2011-01-03 NOTE — Letter (Signed)
[facility]   Imported By: Edmonia James 02/01/2010 10:30:09  _____________________________________________________________________  External Attachment:    Type:   Image     Comment:   External Document

## 2011-01-03 NOTE — Assessment & Plan Note (Signed)
Summary: b12/kn   Nurse Visit   Allergies: 1)  ! Cipro 2)  ! Ceftin  Medication Administration  Injection # 1:    Medication: Vit B12 1000 mcg    Diagnosis: B12 DEFICIENCY (ICD-266.2)    Route: IM    Site: L deltoid    Exp Date: 02/02/2012    Lot #: [id number]    Mfr: [employer] Regent    Patient tolerated injection without complications    Given by: Aron Baba CMA ([place]) (August 05, 2010 9:24 AM)  Orders Added: 1)  Vit B12 1000 mcg [J3420] 2)  Admin of Therapeutic Inj  intramuscular or subcutaneous [21975]

## 2011-01-03 NOTE — Assessment & Plan Note (Signed)
Summary: INCREASED BP AND DIZZINESS/KB   Vital Signs:  Patient profile:   76 year old female Weight:      207 pounds BMI:     35.66 Pulse rate:   77 / minute BP sitting:   154 / 80  (left arm)  Vitals Entered By: Malachi Bonds CMA (November 07, 2010 3:03 PM) CC: elevated bp and dizziness along w/ HA Comments last night bp was 200/112  today before appt bp was 196/98    Current Medications (verified): 1)  Metoprolol Tartrate 25 Mg Tabs (Metoprolol Tartrate) .Marland Kitchen.. 1 By Mouth Two Times A Day 2)  Norvasc 10 Mg  Tabs (Amlodipine Besylate) .Marland Kitchen.. 1 Once Daily 3)  Cozaar 100 Mg  Tabs (Losartan Potassium) .Marland Kitchen.. 1 By Mouth Once Daily 4)  Hydrochlorothiazide 25 Mg  Tabs (Hydrochlorothiazide) .Marland Kitchen.. 1 By Mouth Once Daily 5)  Lantus 100 Unit/ml  Soln (Insulin Glargine) .... 70 Units Every Evening 6)  Apidra 100 Unit/ml Inj Soln (Insulin Glulisine) .... Sliding Scale 7)  Synthroid 25 Mcg  Tabs (Levothyroxine Sodium) .Marland Kitchen.. 1 By Mouth Once Daily**labs Due Now** 8)  Protonix 40 Mg  Pack (Pantoprazole Sodium) .Marland Kitchen.. 1 By Mouth Once Daily 9)  Vytorin 10-20 Mg Tabs (Ezetimibe-Simvastatin) .... Take 1 Tab Once Daily 10)  Diabetic Shoes .... As Directed  Dx 250.00 11)  Ativan 0.5 Mg Tabs (Lorazepam) .Marland Kitchen.. 1 By Mouth Three Times A Day As Needed Anxiety 12)  Ultram 50 Mg Tabs (Tramadol Hcl) .Marland Kitchen.. 1-2 By Mouth Every 6 Hours As Needed 13)  Diabetic Shoes .Marland KitchenMarland Kitchen. 250.00 14)  Vicodin 5-500 Mg Tabs (Hydrocodone-Acetaminophen) .Marland Kitchen.. 1-2 By Mouth Every 6 Hours As Needed 15)  Glucosamine-Chondroitin- 1534m .... Once Daily 16)  Zostavax 19400 Unt/0.653mSolr (Zoster Vaccine Live) ...Marland Kitchen 1 Ml Im X1 17)  Aspir-Low 81 Mg Tbec (Aspirin) ...Marland Kitchen 1 By Mouth Once Daily 18)  Flonase 50 Mcg/act Susp (Fluticasone Propionate) .... 2 Sprays Each Nostril Once Daily 19)  Vitamin D3 50000 Unit Caps (Cholecalciferol) ...Marland Kitchen 1 By Mouth Weekly 20)  Carafate 1 Gm Tabs (Sucralfate) 21)  Lotrisone 1-0.05 % Crea (Clotrimazole-Betamethasone) .... Apply  Two Times A Day  Allergies: 1)  ! Cipro 2)  ! Ceftin   Impression & Recommendations:  Problem # 1:  DIZZINESS (ICD-780.4)  prob secondary to uri Demonstrated maneuvers to self-treat vertigo. Patient to call to be seen if no improvement in 10-14 days, sooner if worse.   Orders: EKG w/ Interpretation (93000)  Problem # 2:  HYPERTENSION (ICD-401.9)  Her updated medication list for this problem includes:    Metoprolol Tartrate 25 Mg Tabs (Metoprolol tartrate) ...Marland Kitchen. 1 by mouth two times a day    Norvasc 10 Mg Tabs (Amlodipine besylate) ...Marland Kitchen. 1 once daily    Cozaar 100 Mg Tabs (Losartan potassium) ...Marland Kitchen. 1 by mouth once daily    Hydrochlorothiazide 25 Mg Tabs (Hydrochlorothiazide) ...Marland Kitchen. 1 by mouth once daily  BP today: 154/80 Prior BP: 126/70 (09/19/2010)  Labs Reviewed: K+: 4.2 (09/08/2010) Creat: : 0.9 (09/08/2010)   Chol: 115 (07/13/2010)   HDL: 35.00 (07/13/2010)   LDL: 54 (07/13/2010)   TG: 128.0 (07/13/2010)  Orders: EKG w/ Interpretation (93000)  Problem # 3:  URI (ICD-465.9)  use nasonex and astepro take zyrtec, claritin or allegra dailyprn  Her updated medication list for this problem includes:    Aspir-low 81 Mg Tbec (Aspirin) ...Marland Kitchen. 1 by mouth once daily  Instructed on symptomatic treatment. Call if symptoms persist or worsen.   Orders: EKG w/  Interpretation (93000)  Complete Medication List: 1)  Metoprolol Tartrate 25 Mg Tabs (Metoprolol tartrate) .Marland Kitchen.. 1 by mouth two times a day 2)  Norvasc 10 Mg Tabs (Amlodipine besylate) .Marland Kitchen.. 1 once daily 3)  Cozaar 100 Mg Tabs (Losartan potassium) .Marland Kitchen.. 1 by mouth once daily 4)  Hydrochlorothiazide 25 Mg Tabs (Hydrochlorothiazide) .Marland Kitchen.. 1 by mouth once daily 5)  Lantus 100 Unit/ml Soln (Insulin glargine) .... 70 units every evening 6)  Apidra 100 Unit/ml Inj Soln (Insulin glulisine) .... Sliding scale 7)  Synthroid 25 Mcg Tabs (Levothyroxine sodium) .Marland Kitchen.. 1 by mouth once daily**labs due now** 8)  Protonix 40 Mg Pack  (Pantoprazole sodium) .Marland Kitchen.. 1 by mouth once daily 9)  Vytorin 10-20 Mg Tabs (Ezetimibe-simvastatin) .... Take 1 tab once daily 10)  Diabetic Shoes  .... As directed  dx 250.00 11)  Ativan 0.5 Mg Tabs (Lorazepam) .Marland Kitchen.. 1 by mouth three times a day as needed anxiety 12)  Ultram 50 Mg Tabs (Tramadol hcl) .Marland Kitchen.. 1-2 by mouth every 6 hours as needed 13)  Diabetic Shoes  .Marland KitchenMarland Kitchen. 250.00 14)  Vicodin 5-500 Mg Tabs (Hydrocodone-acetaminophen) .Marland Kitchen.. 1-2 by mouth every 6 hours as needed 15)  Glucosamine-chondroitin- 1562m  .... Once daily 16)  Zostavax 19400 Unt/0.640mSolr (Zoster vaccine live) ...Marland Kitchen 1 ml im x1 17)  Aspir-low 81 Mg Tbec (Aspirin) ...Marland Kitchen 1 by mouth once daily 18)  Flonase 50 Mcg/act Susp (Fluticasone propionate) .... 2 sprays each nostril once daily 19)  Vitamin D3 50000 Unit Caps (Cholecalciferol) ...Marland Kitchen 1 by mouth weekly 20)  Carafate 1 Gm Tabs (Sucralfate) 21)  Lotrisone 1-0.05 % Crea (Clotrimazole-betamethasone) .... Apply two times a day 22)  Nasonex 50 Mcg/act Susp (Mometasone furoate) 23)  Astepro 0.15 % Soln (Azelastine hcl) .... 2 spray each nostril once daily  Other Orders: Vit B12 1000 mcg (J3420) Admin of Therapeutic Inj  intramuscular or subcutaneous (9(39122 Patient Instructions: 1)  Please schedule a follow-up appointment in 2 weeks.    Medication Administration  Injection # 1:    Medication: Vit B12 1000 mcg    Diagnosis: B12 DEFICIENCY (ICD-266.2)    Route: IM    Site: L deltoid    Exp Date: 02/02/2012    Lot #: [id number]    Mfr: [employer] Regent    Patient tolerated injection without complications    Given by: KiAron BabaMA (ADeborra Medina(November 07, 2010 4:18 PM)  Orders Added: 1)  Vit B12 1000 mcg [J3420] 2)  Admin of Therapeutic Inj  intramuscular or subcutaneous [96372] 3)  Est. Patient Level IV [9[58346])  EKG w/ Interpretation [93000]

## 2011-01-03 NOTE — Progress Notes (Signed)
Summary: completion of paperwork  Phone Note From Other Clinic Call back at [phone]   Caller: [facility] Summary of Call: VM left wanting to confirm fax  for heating pad was sign and faxed back. Pls call to confirm paperwork  has been sign and faxed.pls advise...Marland KitchenMarland KitchenFelecia Deloach CMA  Apr 06, 2010 2:43 PM   Follow-up for Phone Call        They are going to refax paperwork, did not recieve. [place]  Apr 07, 2010 8:18 AM

## 2011-01-03 NOTE — Miscellaneous (Signed)
   Clinical Lists Changes  Medications: Rx of KEFLEX 500 MG CAPS (CEPHALEXIN) 1 by mouth qid for 10 days.;  #12 x 0;  Signed;  Entered by: Malachi Bonds;  Authorized by: Garnet Koyanagi DO;  Method used: Electronically to [facility]. # [id number]*, [address] [place], [place], [place]  [address], Ph: [phone] or [phone], Fax: [phone]    Prescriptions: KEFLEX 500 MG CAPS (CEPHALEXIN) 1 by mouth qid for 10 days.  #12 x 0   Entered by:   Malachi Bonds   Authorized by:   Garnet Koyanagi DO   Signed by:   Malachi Bonds on 02/03/2010   Method used:   Electronically to        [facility]. # [id number]* (retail)       [address]       [place], [place]  [address]       Ph: [phone] or [phone]       Fax: [phone]   RxID:   [id number]

## 2011-01-03 NOTE — Progress Notes (Signed)
Summary: REFERRAL REQUEST  Phone Note Call from Patient Call back at Home Phone [phone]   Caller: Patient Call For: Garnet Koyanagi DO Reason for Call: Referral Summary of Call: PATIENT CALLING, SHE MADE HER OWN APPT W/[facility] FOR 01-20-2010 @ 1:15PM FOR A DIAGNOSTIC MMG.  PATIENT STATES SHE IS STILL W/PAIN IN HER LEFT SIDE & LEFT BREAST, BREAST VERY SORE.  [facility] THIS IN [place].  [facility] REQUIRING A REFERRAL FROM DR. LOWNE.  WILL YOU REFER? Initial call taken by: [facility],  January 14, 2010 3:12 PM  Follow-up for Phone Call        ok  Follow-up by: Garnet Koyanagi DO,  January 14, 2010 5:19 PM

## 2011-01-03 NOTE — Letter (Signed)
Summary: [facility]  [facility]   Imported By: Edmonia James 04/08/2010 11:40:38  _____________________________________________________________________  External Attachment:    Type:   Image     Comment:   External Document

## 2011-01-03 NOTE — Assessment & Plan Note (Signed)
Summary: 1st b12 injection///lch   Nurse Visit   Allergies: 1)  ! Cipro 2)  ! Ceftin  Medication Administration  Injection # 1:    Medication: Vit B12 1000 mcg    Diagnosis: B12 DEFICIENCY (ICD-266.2)    Route: IM    Site: L deltoid    Exp Date: 02/02/2012    Lot #: [id number]    Mfr: [employer] Regent    Patient tolerated injection without complications    Given by: Rolla Flatten CMA (July 22, 2010 4:26 PM)  Orders Added: 1)  Vit B12 1000 mcg [J3420] 2)  Admin of Therapeutic Inj  intramuscular or subcutaneous [91638]

## 2011-01-03 NOTE — Progress Notes (Signed)
Summary: FYI FYI change in pt   Phone Note Call from Patient   Caller: Daughter(cheryl sparks) Summary of Call: pt daughter states that pt is experiencing some change in mood,irritability, anxiety and some possible memory loss. Advise pt daughter that it would be best if she and her mother could come in for OV to discuss change in her mother health. Pt daughter states that she will try but in the mean time would like to let dr Etter Sjogren know what is going on so she can address some of these issue with pt...........Marland KitchenFelecia Deloach CMA  Apr 12, 2010 9:20 AM    Follow-up for Phone Call        she needs appointment Follow-up by: Garnet Koyanagi DO,  Apr 12, 2010 9:53 AM  Additional Follow-up for Phone Call Additional follow up Details #1::        pt daughter already informed OV necessary......Marland KitchenFelecia Deloach CMA  Apr 12, 2010 10:02 AM

## 2011-01-03 NOTE — Assessment & Plan Note (Signed)
Summary: 4th b-12/cbs--couldnt make morning appt, needed late afternoo...   Nurse Visit   Allergies: 1)  ! Cipro 2)  ! Ceftin  Medication Administration  Injection # 1:    Medication: Vit B12 1000 mcg    Diagnosis: B12 DEFICIENCY (ICD-266.2)    Route: IM    Site: L deltoid    Exp Date: 02/02/2012    Lot #: [id number]    Mfr: [employer] Regent    Patient tolerated injection without complications    Given by: Aron Baba CMA Deborra Medina) (August 12, 2010 4:10 PM)  Orders Added: 1)  Vit B12 1000 mcg [J3420] 2)  Admin of Therapeutic Inj  intramuscular or subcutaneous [72897]

## 2011-01-03 NOTE — Assessment & Plan Note (Signed)
Summary: FLU SHOT//PH   Nurse Visit   Allergies: 1)  ! Cipro 2)  ! Ceftin  Orders Added: 1)  Flu Vaccine 74yr + [[41954]2)  Administration Flu vaccine - MCR [[O4814]Flu Vaccine Consent Questions     Do you have a history of severe allergic reactions to this vaccine? no    Any prior history of allergic reactions to egg and/or gelatin? no    Do you have a sensitivity to the preservative Thimersol? no    Do you have a past history of Guillan-Barre Syndrome? no    Do you currently have an acute febrile illness? no    Have you ever had a severe reaction to latex? no    Vaccine information given and explained to patient? yes    Are you currently pregnant? no    Lot Number:[id number]   Exp Date:06/02/2010   Site Given  Left Deltoid IM.lbmedflu

## 2011-01-03 NOTE — Assessment & Plan Note (Signed)
Summary: swollen ankle,can't walk//lch   Vital Signs:  Patient profile:   76 year old female Temp:     98.3 degrees F oral Pulse rate:   84 / minute Pulse rhythm:   regular BP sitting:   130 / 82  (left arm) Cuff size:   large  Vitals Entered By: Georgette Dover (January 27, 2010 10:46 AM) CC: Pt c/o left ankle pain- unsure of what happened. Cannot put any weight on ankle. Started bothering her last night.   History of Present Illness: Pt here with her husband c/o swelling in Left ankle since yesterday-- no known injury.  Pt states she has trouble walking.  She is in a wheelchair and at home she uses walker.    Current Medications (verified): 1)  Metoprolol Tartrate 25 Mg Tabs (Metoprolol Tartrate) .Marland Kitchen.. 1 By Mouth Two Times A Day 2)  Norvasc 10 Mg  Tabs (Amlodipine Besylate) .Marland Kitchen.. 1 Once Daily 3)  Cozaar 100 Mg  Tabs (Losartan Potassium) .Marland Kitchen.. 1 By Mouth Once Daily 4)  Hydrochlorothiazide 25 Mg  Tabs (Hydrochlorothiazide) .Marland Kitchen.. 1 By Mouth Once Daily 5)  Lantus 100 Unit/ml  Soln (Insulin Glargine) .... 60 Units Every Evening 6)  Apidra 100 Unit/ml Inj Soln (Insulin Glulisine) .... Sliding Scale 7)  Synthroid 25 Mcg  Tabs (Levothyroxine Sodium) .Marland Kitchen.. 1 By Mouth Once Daily**labs Due Now** 8)  Protonix 40 Mg  Pack (Pantoprazole Sodium) .Marland Kitchen.. 1 By Mouth Once Daily 9)  Vytorin 10-40 Mg  Tabs (Ezetimibe-Simvastatin) .Marland Kitchen.. 1 By Mouth Once Daily 10)  Singulair 10 Mg  Tabs (Montelukast Sodium) .... As Needed 11)  Dicyclomine Hcl 20 Mg  Tabs (Dicyclomine Hcl) .... As Needed 12)  Diabetic Shoes .... As Directed  Dx 250.00 13)  Ativan 0.5 Mg Tabs (Lorazepam) .Marland Kitchen.. 1 By Mouth Three Times A Day As Needed Anxiety 14)  Ultram 50 Mg Tabs (Tramadol Hcl) .Marland Kitchen.. 1-2 By Mouth Every 6 Hours As Needed 15)  Diabetic Shoes .Marland KitchenMarland Kitchen. 250.00 16)  Vicodin 5-500 Mg Tabs (Hydrocodone-Acetaminophen) .Marland Kitchen.. 1-2 By Mouth Every 6 Hours As Needed  Allergies: 1)  ! Cipro 2)  ! Ceftin  Past History:  Past medical, surgical,  family and social histories (including risk factors) reviewed for relevance to current acute and chronic problems.  Past Medical History: Reviewed history from 12/02/2009 and no changes required. Asthma Diabetes mellitus, type II Osteopenia Current Problems:  NEPHRECTOMY, HX OF (ICD-V45.73) CARCINOMA, RENAL CELL (ICD-189.0) LOW BACK PAIN, ACUTE (ICD-724.2) NECK PAIN, LEFT (ICD-723.1) WOUND, FINGER (ICD-883.0) PARESTHESIA (ICD-782.0) LUMP OR MASS IN BREAST, R (ICD-611.72) BREAST PAIN, RIGHT (ICD-611.71) ABDOMINAL PAIN (ICD-789.00) CALCULUS, KIDNEY (ICD-592.0) GOITER NOS (ICD-240.9) OSTEOPENIA (ICD-733.90) DIABETES MELLITUS, TYPE II (ICD-250.00) ASTHMA (ICD-493.90)  Family History: Reviewed history from 12/02/2009 and no changes required. s- bladder ca f- renal ca Family History of CAD Female 1st degree relative 43s Family History Diabetes 1st degree relative Family History High cholesterol Family History Hypertension  Social History: Reviewed history from 12/02/2009 and no changes required. Never Smoked Alcohol use-no Drug use-no  Review of Systems      See HPI  Physical Exam  General:  Well-developed,well-nourished,in no acute distress; alert,appropriate and cooperative throughout examination Msk:  L ankle---  Lateral swelling,  warm to touch tender Skin:  Intact without suspicious lesions or rashes Psych:  Oriented X3 and normally interactive.     Impression & Recommendations:  Problem # 1:  GOUT, UNSPECIFIED (ICD-274.9)  Orders: Venipuncture (56433) T-Ankle Comp Left Min 3 Views (73610TC) TLB-BMP (Basic Metabolic Panel-BMET) (29518-ACZYSAY)  TLB-Uric Acid, Blood (84550-URIC)  Elevate extremity; warm compresses, symptomatic relief and medication as directed. ---purine diet HO given to pt  Problem # 2:  ANKLE PAIN, LEFT (ICD-719.47)  Orders: T-Ankle Comp Left Min 3 Views (73610TC)  Complete Medication List: 1)  Metoprolol Tartrate 25 Mg Tabs  (Metoprolol tartrate) .Marland Kitchen.. 1 by mouth two times a day 2)  Norvasc 10 Mg Tabs (Amlodipine besylate) .Marland Kitchen.. 1 once daily 3)  Cozaar 100 Mg Tabs (Losartan potassium) .Marland Kitchen.. 1 by mouth once daily 4)  Hydrochlorothiazide 25 Mg Tabs (Hydrochlorothiazide) .Marland Kitchen.. 1 by mouth once daily 5)  Lantus 100 Unit/ml Soln (Insulin glargine) .... 60 units every evening 6)  Apidra 100 Unit/ml Inj Soln (Insulin glulisine) .... Sliding scale 7)  Synthroid 25 Mcg Tabs (Levothyroxine sodium) .Marland Kitchen.. 1 by mouth once daily**labs due now** 8)  Protonix 40 Mg Pack (Pantoprazole sodium) .Marland Kitchen.. 1 by mouth once daily 9)  Vytorin 10-40 Mg Tabs (Ezetimibe-simvastatin) .Marland Kitchen.. 1 by mouth once daily 10)  Singulair 10 Mg Tabs (Montelukast sodium) .... As needed 11)  Dicyclomine Hcl 20 Mg Tabs (Dicyclomine hcl) .... As needed 12)  Diabetic Shoes  .... As directed  dx 250.00 13)  Ativan 0.5 Mg Tabs (Lorazepam) .Marland Kitchen.. 1 by mouth three times a day as needed anxiety 14)  Ultram 50 Mg Tabs (Tramadol hcl) .Marland Kitchen.. 1-2 by mouth every 6 hours as needed 15)  Diabetic Shoes  .Marland KitchenMarland Kitchen. 250.00 16)  Vicodin 5-500 Mg Tabs (Hydrocodone-acetaminophen) .Marland Kitchen.. 1-2 by mouth every 6 hours as needed Prescriptions: VICODIN 5-500 MG TABS (HYDROCODONE-ACETAMINOPHEN) 1-2 by mouth every 6 hours as needed  #30 x 0   Entered and Authorized by:   Garnet Koyanagi DO   Signed by:   Garnet Koyanagi DO on 01/27/2010   Method used:   Print then Give to Patient   RxID:   [id number]

## 2011-01-03 NOTE — Procedures (Signed)
Summary: Upper GI Endoscopy/[facility]  [facility] Endoscopy/[facility]   Imported By: Edmonia James 02/21/2010 08:58:11  _____________________________________________________________________  External Attachment:    Type:   Image     Comment:   External Document

## 2011-01-03 NOTE — Progress Notes (Signed)
Summary: Referral for Mammogram --NEW INFO  Phone Note Call from Patient   Caller: Patient Summary of Call: Needs referral for [place] [phone]. If appt is made for her,has to be after 1pm and not on a Monday. Initial call taken by: Osborn Coho,  August 16, 2010 3:50 PM  Follow-up for Phone Call        pt can schedule her own screening mammogram Follow-up by: Garnet Koyanagi DO,  August 16, 2010 5:13 PM  Additional Follow-up for Phone Call Additional follow up Details #1::        Left message to call back  [place] Deborra Medina)  August 17, 2010 8:20 AM   New Problems: BREAST MASS, RIGHT (ICD-611.72)   Additional Follow-up for Phone Call Additional follow up Details #2::    PATIENT SAYS SHE HAD LAST MAMMOGRAM IN JAN 2011 AND WAS TOLD TO RETURN IN 4 MONTHS DUE TO SOME "SUSPICIOUS AREAS"---IS NOW JUST MAKING THIS APPOINTMENT---SAYS SHE NEEDS REFERRAL SINCE IT WILL NOT BE A YEAR SINCE HER LAST MAMMOGRAM FOR MEDICARE  New Problems: BREAST MASS, RIGHT (ICD-611.72)

## 2011-01-03 NOTE — Letter (Signed)
Summary: CMN for Moist Heating Pad/[facility]  CMN for Moist Heating Pad/[facility]   Imported By: Edmonia James 04/13/2010 13:08:38  _____________________________________________________________________  External Attachment:    Type:   Image     Comment:   External Document

## 2011-01-03 NOTE — Medication Information (Signed)
Summary: Approval for Patient Assistance Program/[employer]  Approval for Patient Assistance Program/[employer]   Imported By: Edmonia James 05/27/2010 12:58:02  _____________________________________________________________________  External Attachment:    Type:   Image     Comment:   External Document

## 2011-01-03 NOTE — Assessment & Plan Note (Signed)
Summary: for underarm rash//ph   Vital Signs:  Patient profile:   76 year old female Weight:      205.8 pounds BMI:     35.45 Temp:     98.0 degrees F oral Pulse rate:   60 / minute Pulse rhythm:   regular BP sitting:   126 / 70  (right arm) Cuff size:   large  Vitals Entered By: Aron Baba CMA Deborra Medina) (September 19, 2010 9:23 AM)  CC: c/o bilateral underarm rash x1week worst over the last x4days, Rash   History of Present Illness:  Rash      This is a 76 year old woman who presents with Rash.  The symptoms began 1 week ago.  Pt used new deoderant and broke out under both arms ---L > R .  The patient complains of macules and itching.  The rash is worse with heat and better with cold.  The patient denies the following symptoms: fever, headache, facial swelling, tongue swelling, burning, difficulty breathing, abdominal pain, nausea, vomiting, diarrhea, dizziness, sore throat, dysuria, eye symptoms, arthralgias, and vaginal discharge.  The patient reports a history of new topical exposure.  The patient denies history of recent tick bite, recent tick exposure, other insect bite, recent infection, recent antibiotic use, new medication, new clothing, recent travel, pet/animal contact, thyroid disease, chronic liver disease, autoimmune disease, chronic edema, and prior STD.    Current Medications (verified): 1)  Metoprolol Tartrate 25 Mg Tabs (Metoprolol Tartrate) .Marland Kitchen.. 1 By Mouth Two Times A Day 2)  Norvasc 10 Mg  Tabs (Amlodipine Besylate) .Marland Kitchen.. 1 Once Daily 3)  Cozaar 100 Mg  Tabs (Losartan Potassium) .Marland Kitchen.. 1 By Mouth Once Daily 4)  Hydrochlorothiazide 25 Mg  Tabs (Hydrochlorothiazide) .Marland Kitchen.. 1 By Mouth Once Daily 5)  Lantus 100 Unit/ml  Soln (Insulin Glargine) .... 70 Units Every Evening 6)  Apidra 100 Unit/ml Inj Soln (Insulin Glulisine) .... Sliding Scale 7)  Synthroid 25 Mcg  Tabs (Levothyroxine Sodium) .Marland Kitchen.. 1 By Mouth Once Daily**labs Due Now** 8)  Protonix 40 Mg  Pack (Pantoprazole  Sodium) .Marland Kitchen.. 1 By Mouth Once Daily 9)  Vytorin 10-20 Mg Tabs (Ezetimibe-Simvastatin) .... Take 1 Tab Once Daily 10)  Diabetic Shoes .... As Directed  Dx 250.00 11)  Ativan 0.5 Mg Tabs (Lorazepam) .Marland Kitchen.. 1 By Mouth Three Times A Day As Needed Anxiety 12)  Ultram 50 Mg Tabs (Tramadol Hcl) .Marland Kitchen.. 1-2 By Mouth Every 6 Hours As Needed 13)  Diabetic Shoes .Marland KitchenMarland Kitchen. 250.00 14)  Vicodin 5-500 Mg Tabs (Hydrocodone-Acetaminophen) .Marland Kitchen.. 1-2 By Mouth Every 6 Hours As Needed 15)  Glucosamine-Chondroitin- 1548m .... Once Daily 16)  Zostavax 19400 Unt/0.671mSolr (Zoster Vaccine Live) ...Marland Kitchen 1 Ml Im X1 17)  Aspir-Low 81 Mg Tbec (Aspirin) ...Marland Kitchen 1 By Mouth Once Daily 18)  Flonase 50 Mcg/act Susp (Fluticasone Propionate) .... 2 Sprays Each Nostril Once Daily 19)  Vitamin D3 50000 Unit Caps (Cholecalciferol) ...Marland Kitchen 1 By Mouth Weekly 20)  Carafate 1 Gm Tabs (Sucralfate) 21)  Lotrisone 1-0.05 % Crea (Clotrimazole-Betamethasone) .... Apply Two Times A Day  Allergies (verified): 1)  ! Cipro 2)  ! Ceftin  Past History:  Past medical, surgical, family and social histories (including risk factors) reviewed for relevance to current acute and chronic problems.  Past Medical History: Reviewed history from 06/21/2010 and no changes required. Asthma Diabetes mellitus, type II Osteopenia Current Problems:  NEPHRECTOMY, HX OF (ICD-V45.73) CARCINOMA, RENAL CELL (ICD-189.0) LOW BACK PAIN, ACUTE (ICD-724.2) NECK PAIN, LEFT (ICD-723.1) WOUND, FINGER (ICD-883.0)  PARESTHESIA (ICD-782.0) LUMP OR MASS IN BREAST, R (ICD-611.72) BREAST PAIN, RIGHT (ICD-611.71) ABDOMINAL PAIN (ICD-789.00) CALCULUS, KIDNEY (ICD-592.0) GOITER NOS (ICD-240.9) OSTEOPENIA (ICD-733.90) DIABETES MELLITUS, TYPE II (ICD-250.00) ASTHMA (ICD-493.90) Hypertension  Family History: Reviewed history from 12/02/2009 and no changes required. s- bladder ca f- renal ca Family History of CAD Female 1st degree relative 75s Family History Diabetes 1st degree  relative Family History High cholesterol Family History Hypertension  Social History: Reviewed history from 12/02/2009 and no changes required. Never Smoked Alcohol use-no Drug use-no  Review of Systems      See HPI  Physical Exam  General:  Well-developed,well-nourished,in no acute distress; alert,appropriate and cooperative throughout examination Skin:  + errythematous rash b/L axillas ---L > R no signs infection Psych:  Cognition and judgment appear intact. Alert and cooperative with normal attention span and concentration. No apparent delusions, illusions, hallucinations   Impression & Recommendations:  Problem # 1:  CONTACT DERMATITIS (ICD-692.9)  stop using all deoderant use lotrisone rto as needed   Discussed avoidance of triggers and symptomatic treatment.   Orders: Prescription Created Electronically [id number])  Complete Medication List: 1)  Metoprolol Tartrate 25 Mg Tabs (Metoprolol tartrate) .Marland Kitchen.. 1 by mouth two times a day 2)  Norvasc 10 Mg Tabs (Amlodipine besylate) .Marland Kitchen.. 1 once daily 3)  Cozaar 100 Mg Tabs (Losartan potassium) .Marland Kitchen.. 1 by mouth once daily 4)  Hydrochlorothiazide 25 Mg Tabs (Hydrochlorothiazide) .Marland Kitchen.. 1 by mouth once daily 5)  Lantus 100 Unit/ml Soln (Insulin glargine) .... 70 units every evening 6)  Apidra 100 Unit/ml Inj Soln (Insulin glulisine) .... Sliding scale 7)  Synthroid 25 Mcg Tabs (Levothyroxine sodium) .Marland Kitchen.. 1 by mouth once daily**labs due now** 8)  Protonix 40 Mg Pack (Pantoprazole sodium) .Marland Kitchen.. 1 by mouth once daily 9)  Vytorin 10-20 Mg Tabs (Ezetimibe-simvastatin) .... Take 1 tab once daily 10)  Diabetic Shoes  .... As directed  dx 250.00 11)  Ativan 0.5 Mg Tabs (Lorazepam) .Marland Kitchen.. 1 by mouth three times a day as needed anxiety 12)  Ultram 50 Mg Tabs (Tramadol hcl) .Marland Kitchen.. 1-2 by mouth every 6 hours as needed 13)  Diabetic Shoes  .Marland KitchenMarland Kitchen. 250.00 14)  Vicodin 5-500 Mg Tabs (Hydrocodone-acetaminophen) .Marland Kitchen.. 1-2 by mouth every 6 hours as  needed 15)  Glucosamine-chondroitin- 1566m  .... Once daily 16)  Zostavax 19400 Unt/0.670mSolr (Zoster vaccine live) ...Marland Kitchen 1 ml im x1 17)  Aspir-low 81 Mg Tbec (Aspirin) ...Marland Kitchen 1 by mouth once daily 18)  Flonase 50 Mcg/act Susp (Fluticasone propionate) .... 2 sprays each nostril once daily 19)  Vitamin D3 50000 Unit Caps (Cholecalciferol) ...Marland Kitchen 1 by mouth weekly 20)  Carafate 1 Gm Tabs (Sucralfate) 21)  Lotrisone 1-0.05 % Crea (Clotrimazole-betamethasone) .... Apply two times a day  Other Orders: Flu Vaccine 3y45yr MEDICARE PATIENTS (Q2(H0097dministration Flu vaccine - MCR (G0008) Specimen Handling (99000) Venipuncture (36(94997LB-CBC Platelet - w/Differential (85025-CBCD) Prescriptions: LOTRISONE 1-0.05 % CREA (CLOTRIMAZOLE-BETAMETHASONE) apply two times a day  #30g x 1   Entered and Authorized by:   YvoGarnet Koyanagi   Signed by:   YvoGarnet Koyanagi on 09/19/2010   Method used:   Electronically to        [id number]       [address]    [place]  [id number]    Ph: [phone]    Fax: [phone]  RxID:   [id number]    Orders Added: 1)  Est. Patient Level III [24462] 2)  Prescription Created Electronically [G8553] 3)  Flu Vaccine 19yr + MEDICARE PATIENTS [Q2039] 4)  Administration Flu vaccine - MCR [[M6381]5)  Specimen Handling [[77116]6)  Venipuncture [[57903]7)  TLB-CBC Platelet - w/Differential [85025-CBCD] Flu Vaccine Consent Questions     Do you have a history of severe allergic reactions to this vaccine? no    Any prior history of allergic reactions to egg and/or gelatin? no    Do you have a sensitivity to the preservative Thimersol? no    Do you have a past history of Guillan-Barre Syndrome? no    Do you currently have an acute febrile illness? no    Have you ever had a severe reaction to latex? no    Vaccine information given and explained to patient? yes    Are you currently  pregnant? no    Lot Number:[id number]   Exp Date:06/03/2011   Site Given  Right Deltoid IMine 320yr+ MEDICARE PATIENTS [Q2039] 4)  Administration Flu vaccine - MCR [G[Y3338] .lbmedflu1

## 2011-01-03 NOTE — Procedures (Signed)
Summary: Colonoscopy/[facility]  Colonoscopy/[facility]   Imported By: Edmonia James 02/21/2010 08:59:13  _____________________________________________________________________  External Attachment:    Type:   Image     Comment:   External Document

## 2011-01-03 NOTE — Progress Notes (Signed)
Summary: REFERRAL   Phone Note Call from Patient   Summary of Call: PT CAME FOR LABS AND WANTED TO LEAVE THIS NOTE: I NEED AN APPT WITH DR. Jannifer Franklin FOR THE DIZZINESS AND IMBALANCE BUT THEY SAID I NEED A REFERRAL FROM YOU. I LAST SAW HIM IN SHU.8372. WOULD YOU HAVE YOUR NURSE CALL ME ON THIS .   THANKS Initial call taken by: Velora Heckler,  July 13, 2010 10:51 AM  Follow-up for Phone Call        [place]. PT LAST SEEN 07-04-10 FOR THESE ISSUES.................Marland KitchenFelecia Deloach CMA  July 13, 2010 12:08 PM    Additional Follow-up for Phone Call Additional follow up Details #1::        referral put in Additional Follow-up by: Garnet Koyanagi DO,  July 13, 2010 12:10 PM    Additional Follow-up for Phone Call Additional follow up Details #2::    PT AWARE.............Marland KitchenFelecia Deloach CMA  July 13, 2010 12:53 PM

## 2011-01-03 NOTE — Letter (Signed)
Summary: Appt Scheduled/[facility]   Imported By: Edmonia James 08/31/2010 09:23:17  _____________________________________________________________________  External Attachment:    Type:   Image     Comment:   External Document

## 2011-01-03 NOTE — Assessment & Plan Note (Signed)
Summary: restart ativan/cbs   Vital Signs:  Patient profile:   76 year old female Height:      64 inches Weight:      211 pounds BMI:     36.35 Temp:     98.6 degrees F oral Pulse rate:   72 / minute BP sitting:   130 / 78  (left arm)  Vitals Entered By: Rolla Flatten CMA (June 21, 2010 3:21 PM) CC: discuss restart med, refills   History of Present Illness: Pt here for med refills .  No complaints.    Hypertension follow-up      This is a 76 year old woman who presents for Hypertension follow-up.  The patient denies lightheadedness, urinary frequency, headaches, edema, impotence, rash, and fatigue.  The patient denies the following associated symptoms: chest pain, chest pressure, exercise intolerance, dyspnea, palpitations, syncope, leg edema, and pedal edema.  Compliance with medications (by patient report) has been near 100%.  The patient reports that dietary compliance has been good.  The patient reports exercising occasionally.  Adjunctive measures currently used by the patient include salt restriction.    Preventive Screening-Counseling & Management  Alcohol-Tobacco     Alcohol drinks/day: 0     Smoking Status: never  Current Medications (verified): 1)  Metoprolol Tartrate 25 Mg Tabs (Metoprolol Tartrate) .Marland Kitchen.. 1 By Mouth Two Times A Day 2)  Norvasc 10 Mg  Tabs (Amlodipine Besylate) .Marland Kitchen.. 1 Once Daily 3)  Cozaar 100 Mg  Tabs (Losartan Potassium) .Marland Kitchen.. 1 By Mouth Once Daily 4)  Hydrochlorothiazide 25 Mg  Tabs (Hydrochlorothiazide) .Marland Kitchen.. 1 By Mouth Once Daily 5)  Lantus 100 Unit/ml  Soln (Insulin Glargine) .... 70 Units Every Evening 6)  Apidra 100 Unit/ml Inj Soln (Insulin Glulisine) .... Sliding Scale 7)  Synthroid 25 Mcg  Tabs (Levothyroxine Sodium) .Marland Kitchen.. 1 By Mouth Once Daily**labs Due Now** 8)  Protonix 40 Mg  Pack (Pantoprazole Sodium) .Marland Kitchen.. 1 By Mouth Once Daily 9)  Vytorin 10-40 Mg  Tabs (Ezetimibe-Simvastatin) .Marland Kitchen.. 1 By Mouth Once Daily 10)  Singulair 10 Mg  Tabs  (Montelukast Sodium) .... As Needed 11)  Dicyclomine Hcl 20 Mg  Tabs (Dicyclomine Hcl) .... As Needed 12)  Diabetic Shoes .... As Directed  Dx 250.00 13)  Ativan 0.5 Mg Tabs (Lorazepam) .Marland Kitchen.. 1 By Mouth Three Times A Day As Needed Anxiety 14)  Ultram 50 Mg Tabs (Tramadol Hcl) .Marland Kitchen.. 1-2 By Mouth Every 6 Hours As Needed 15)  Diabetic Shoes .Marland KitchenMarland Kitchen. 250.00 16)  Vicodin 5-500 Mg Tabs (Hydrocodone-Acetaminophen) .Marland Kitchen.. 1-2 By Mouth Every 6 Hours As Needed 17)  Glucosamine-Chondroitin- 1560m .... Once Daily 18)  Zostavax 19400 Unt/0.668mSolr (Zoster Vaccine Live) ...Marland Kitchen 1 Ml Im X1  Allergies (verified): 1)  ! Cipro 2)  ! Ceftin  Past History:  Past medical, surgical, family and social histories (including risk factors) reviewed for relevance to current acute and chronic problems.  Past Medical History: Asthma Diabetes mellitus, type II Osteopenia Current Problems:  NEPHRECTOMY, HX OF (ICD-V45.73) CARCINOMA, RENAL CELL (ICD-189.0) LOW BACK PAIN, ACUTE (ICD-724.2) NECK PAIN, LEFT (ICD-723.1) WOUND, FINGER (ICD-883.0) PARESTHESIA (ICD-782.0) LUMP OR MASS IN BREAST, R (ICD-611.72) BREAST PAIN, RIGHT (ICD-611.71) ABDOMINAL PAIN (ICD-789.00) CALCULUS, KIDNEY (ICD-592.0) GOITER NOS (ICD-240.9) OSTEOPENIA (ICD-733.90) DIABETES MELLITUS, TYPE II (ICD-250.00) ASTHMA (ICD-493.90) Hypertension  Family History: Reviewed history from 12/02/2009 and no changes required. s- bladder ca f- renal ca Family History of CAD Female 1st degree relative 8066samily History Diabetes 1st degree relative Family History High cholesterol Family History  Hypertension  Social History: Reviewed history from 12/02/2009 and no changes required. Never Smoked Alcohol use-no Drug use-no  Review of Systems      See HPI  Physical Exam  General:  Well-developed,well-nourished,in no acute distress; alert,appropriate and cooperative throughout examination Neck:  No deformities, masses, or tenderness noted. Lungs:   Normal respiratory effort, chest expands symmetrically. Lungs are clear to auscultation, no crackles or wheezes. Heart:  normal rate and Grade   2/6 systolic ejection murmur.   Extremities:  No clubbing, cyanosis, edema, or deformity noted with normal full range of motion of all joints.   Psych:  Oriented X3 and normally interactive.     Impression & Recommendations:  Problem # 1:  OSTEOPENIA (ICD-733.90)  Problem # 2:  HYPERTENSION (ICD-401.9)  Her updated medication list for this problem includes:    Metoprolol Tartrate 25 Mg Tabs (Metoprolol tartrate) .Marland Kitchen... 1 by mouth two times a day    Norvasc 10 Mg Tabs (Amlodipine besylate) .Marland Kitchen... 1 once daily    Cozaar 100 Mg Tabs (Losartan potassium) .Marland Kitchen... 1 by mouth once daily    Hydrochlorothiazide 25 Mg Tabs (Hydrochlorothiazide) .Marland Kitchen... 1 by mouth once daily  Problem # 3:  LOW BACK PAIN, ACUTE (ICD-724.2)  Her updated medication list for this problem includes:    Ultram 50 Mg Tabs (Tramadol hcl) .Marland Kitchen... 1-2 by mouth every 6 hours as needed    Vicodin 5-500 Mg Tabs (Hydrocodone-acetaminophen) .Marland Kitchen... 1-2 by mouth every 6 hours as needed  Discussed use of moist heat or ice, modified activities, medications, and stretching/strengthening exercises. Back care instructions given. To be seen in 2 weeks if no improvement; sooner if worsening of symptoms.   Problem # 4:  DIABETES MELLITUS, TYPE II (ICD-250.00) per endo. Her updated medication list for this problem includes:    Cozaar 100 Mg Tabs (Losartan potassium) .Marland Kitchen... 1 by mouth once daily    Lantus 100 Unit/ml Soln (Insulin glargine) .Marland KitchenMarland KitchenMarland KitchenMarland Kitchen 70 units every evening    Apidra 100 Unit/ml Inj Soln (Insulin glulisine) ..... Sliding scale  Problem # 5:  ASTHMA (ICD-493.90)  Her updated medication list for this problem includes:    Singulair 10 Mg Tabs (Montelukast sodium) .Marland Kitchen... As needed  Complete Medication List: 1)  Metoprolol Tartrate 25 Mg Tabs (Metoprolol tartrate) .Marland Kitchen.. 1 by mouth two times a  day 2)  Norvasc 10 Mg Tabs (Amlodipine besylate) .Marland Kitchen.. 1 once daily 3)  Cozaar 100 Mg Tabs (Losartan potassium) .Marland Kitchen.. 1 by mouth once daily 4)  Hydrochlorothiazide 25 Mg Tabs (Hydrochlorothiazide) .Marland Kitchen.. 1 by mouth once daily 5)  Lantus 100 Unit/ml Soln (Insulin glargine) .... 70 units every evening 6)  Apidra 100 Unit/ml Inj Soln (Insulin glulisine) .... Sliding scale 7)  Synthroid 25 Mcg Tabs (Levothyroxine sodium) .Marland Kitchen.. 1 by mouth once daily**labs due now** 8)  Protonix 40 Mg Pack (Pantoprazole sodium) .Marland Kitchen.. 1 by mouth once daily 9)  Vytorin 10-40 Mg Tabs (Ezetimibe-simvastatin) .Marland Kitchen.. 1 by mouth once daily 10)  Singulair 10 Mg Tabs (Montelukast sodium) .... As needed 11)  Dicyclomine Hcl 20 Mg Tabs (Dicyclomine hcl) .... As needed 12)  Diabetic Shoes  .... As directed  dx 250.00 13)  Ativan 0.5 Mg Tabs (Lorazepam) .Marland Kitchen.. 1 by mouth three times a day as needed anxiety 14)  Ultram 50 Mg Tabs (Tramadol hcl) .Marland Kitchen.. 1-2 by mouth every 6 hours as needed 15)  Diabetic Shoes  .Marland KitchenMarland Kitchen. 250.00 16)  Vicodin 5-500 Mg Tabs (Hydrocodone-acetaminophen) .Marland Kitchen.. 1-2 by mouth every 6 hours as needed 17)  Glucosamine-chondroitin- 1512m  .... Once daily 18)  Zostavax 19400 Unt/0.639mSolr (Zoster vaccine live) ...Marland Kitchen 1 ml im x1  Patient Instructions: 1)  272.4  733.1  vita D, lipid, hep, bmp, tsh, free t3,  free t4 Prescriptions: ULTRAM 50 MG TABS (TRAMADOL HCL) 1-2 by mouth every 6 hours as needed  #60 x 2   Entered and Authorized by:   YvGarnet KoyanagiO   Signed by:   YvGarnet KoyanagiO on 06/21/2010   Method used:   Electronically to        [id number]ail)       [address]     [place]  [address]     Ph: [phone]     Fax: [phone] RxID:   [id number] 100 MG  TABS (LOSARTAN POTASSIUM) 1 by mouth once daily  #90 x 3   Entered by:   FeRolla FlattenMA   Authorized by:   YvGarnet KoyanagiO   Signed by:   FeRolla FlattenMA on  06/21/2010   Method used:   Reprint   RxID: :   [id number] 1975916NT/0.65ML SOLR (ZOSTER VACCINE LIVE) 1 ml im x1  #1 x 0   Entered by:   FeRolla FlattenMA   Authorized by:   YvGarnet KoyanagiO   Signed by:   FeRolla FlattenMA on 06/21/2010   Method used:   Reprint   RxID: :   [id number] 1939030NT/0.65ML SOLR (ZOSTER VACCINE LIVE) 1 ml im x1  #1 x 0   Entered and Authorized by:   YvGarnet KoyanagiO   Signed by:   YvGarnet KoyanagiO on 06/21/2010   Method used:   Print then Give to Patient   RxID:   [id number] 1945625NT/0.65ML SOLR (ZOSTER VACCINE LIVE) 1 ml im x1  #1 x 0   Entered and Authorized by:   YvGarnet KoyanagiO   Signed by:   YvGarnet KoyanagiO on 06/21/2010   Method used:   Print then Give to Patient   RxID:   [id number] 5-500 MG TABS (HYDROCODONE-ACETAMINOPHEN) 1-2 by mouth every 6 hours as needed  #30 x 0   Entered and Authorized by:   YvGarnet KoyanagiO   Signed by:   YvGarnet KoyanagiO on 06/21/2010   Method used:   Print then Give to Patient   RxID:   [id number] 0.5 MG TABS (LORAZEPAM) 1 by mouth three times a day as needed anxiety  #60 x 2   Entered and Authorized by:   YvGarnet KoyanagiO   Signed by:   YvGarnet KoyanagiO on 06/21/2010   Method used:   Print then Give to Patient   RxID:   [id number] MG  TABS (AMLODIPINE BESYLATE) 1 once daily  #90 x 3   Entered and Authorized by:   YvGarnet KoyanagiO   Signed by:   YvGarnet KoyanagiO on 06/21/2010   Method used:   Print then Give to Patient   RxID:   [id number] 25 MCG  TABS (LEVOTHYROXINE SODIUM) 1 by mouth once daily**LABS DUE NOW**  #90 x 3   Entered and Authorized by:   YvGarnet KoyanagiO   Signed by:   YvGarnet KoyanagiO on 06/21/2010   Method used:   Print then Give to Patient   RxID:  [id number] METOPROLOL TARTRATE 25 MG TABS (METOPROLOL TARTRATE) 1 by mouth two times a day  #180 Tablet x 3   Entered and Authorized by:   Garnet Koyanagi  DO   Signed by:   Garnet Koyanagi DO on 06/21/2010   Method used:   Print then Give to Patient   RxID:   [id number] COZAAR 100 MG  TABS (LOSARTAN POTASSIUM) 1 by mouth once daily  #30 Tablet x 0   Entered and Authorized by:   Garnet Koyanagi DO   Signed by:   Garnet Koyanagi DO on 06/21/2010   Method used:   Electronically to        [facility] # [id number] (retail)       [address]       [place], [place]  [address]       Ph: [phone] or [phone]       Fax: [phone]   RxID:   [id number]

## 2011-01-03 NOTE — Assessment & Plan Note (Signed)
Summary: b-12/cbs   Nurse Visit   Allergies: 1)  ! Cipro 2)  ! Ceftin  Medication Administration  Injection # 1:    Medication: Vit B12 1000 mcg    Diagnosis:  B12 DEFICIENCY (ICD-266.2)    Route: IM    Site: L deltoid    Exp Date: 02/02/2012    Lot #: [id number]    Mfr: [employer]  Orders Added: 1)  Vit B12 1000 mcg [J3420] 2)  Admin of Therapeutic Inj  intramuscular or subcutaneous [23921]

## 2011-01-03 NOTE — Assessment & Plan Note (Signed)
Summary: BP IS HIGH/FEELS OFF BALANCE//KN   Vital Signs:  Patient profile:   76 year old female Height:      64 inches (162.56 cm) Weight:      210.25 pounds (95.57 kg) BMI:     36.22 Temp:     98.1 degrees F (36.72 degrees C) oral Pulse rate:   60 / minute BP sitting:   154 / 74  (left arm) Cuff size:   large  Vitals Entered By: Ernestene Mention CMA (July 04, 2010 1:44 PM) CC: C/O high BP and balance issues./kb Is Patient Diabetic? Yes Pain Assessment Patient in pain? no      Comments Patient notes that her BP yesterday was 187/108 and has been experiencing HA, dizziness and balance issues. Patient denies fever and blurry vision./kb   History of Present Illness: Pt here c/o elevated BP and dizziness ---her bp was 187/108.  No CP or inc SOB.  Pt admits to being out of cozaar for a week last week.  No other complaints.     Current Medications (verified): 1)  Metoprolol Tartrate 25 Mg Tabs (Metoprolol Tartrate) .Marland Kitchen.. 1 By Mouth Two Times A Day 2)  Norvasc 10 Mg  Tabs (Amlodipine Besylate) .Marland Kitchen.. 1 Once Daily 3)  Cozaar 100 Mg  Tabs (Losartan Potassium) .Marland Kitchen.. 1 By Mouth Once Daily 4)  Hydrochlorothiazide 25 Mg  Tabs (Hydrochlorothiazide) .Marland Kitchen.. 1 By Mouth Once Daily 5)  Lantus 100 Unit/ml  Soln (Insulin Glargine) .... 70 Units Every Evening 6)  Apidra 100 Unit/ml Inj Soln (Insulin Glulisine) .... Sliding Scale 7)  Synthroid 25 Mcg  Tabs (Levothyroxine Sodium) .Marland Kitchen.. 1 By Mouth Once Daily**labs Due Now** 8)  Protonix 40 Mg  Pack (Pantoprazole Sodium) .Marland Kitchen.. 1 By Mouth Once Daily 9)  Vytorin 10-40 Mg  Tabs (Ezetimibe-Simvastatin) .Marland Kitchen.. 1 By Mouth Once Daily 10)  Diabetic Shoes .... As Directed  Dx 250.00 11)  Ativan 0.5 Mg Tabs (Lorazepam) .Marland Kitchen.. 1 By Mouth Three Times A Day As Needed Anxiety 12)  Ultram 50 Mg Tabs (Tramadol Hcl) .Marland Kitchen.. 1-2 By Mouth Every 6 Hours As Needed 13)  Diabetic Shoes .Marland KitchenMarland Kitchen. 250.00 14)  Vicodin 5-500 Mg Tabs (Hydrocodone-Acetaminophen) .Marland Kitchen.. 1-2 By Mouth Every 6 Hours As  Needed 15)  Glucosamine-Chondroitin- 1555m .... Once Daily 16)  Zostavax 19400 Unt/0.660mSolr (Zoster Vaccine Live) ...Marland Kitchen 1 Ml Im X1 17)  Aspir-Low 81 Mg Tbec (Aspirin) ...Marland Kitchen 1 By Mouth Once Daily 18)  Flonase 50 Mcg/act Susp (Fluticasone Propionate) .... 2 Sprays Each Nostril Once Daily  Allergies (verified): 1)  ! Cipro 2)  ! Ceftin  Past History:  Past medical, surgical, family and social histories (including risk factors) reviewed for relevance to current acute and chronic problems.  Past Medical History: Reviewed history from 06/21/2010 and no changes required. Asthma Diabetes mellitus, type II Osteopenia Current Problems:  NEPHRECTOMY, HX OF (ICD-V45.73) CARCINOMA, RENAL CELL (ICD-189.0) LOW BACK PAIN, ACUTE (ICD-724.2) NECK PAIN, LEFT (ICD-723.1) WOUND, FINGER (ICD-883.0) PARESTHESIA (ICD-782.0) LUMP OR MASS IN BREAST, R (ICD-611.72) BREAST PAIN, RIGHT (ICD-611.71) ABDOMINAL PAIN (ICD-789.00) CALCULUS, KIDNEY (ICD-592.0) GOITER NOS (ICD-240.9) OSTEOPENIA (ICD-733.90) DIABETES MELLITUS, TYPE II (ICD-250.00) ASTHMA (ICD-493.90) Hypertension  Family History: Reviewed history from 12/02/2009 and no changes required. s- bladder ca f- renal ca Family History of CAD Female 1st degree relative 8063samily History Diabetes 1st degree relative Family History High cholesterol Family History Hypertension  Social History: Reviewed history from 12/02/2009 and no changes required. Never Smoked Alcohol use-no Drug use-no  Review of Systems  See HPI  Physical Exam  General:  Well-developed,well-nourished,in no acute distress; alert,appropriate and cooperative throughout examination Eyes:  pupils equal, pupils round, and pupils reactive to light.   Ears:  External ear exam shows no significant lesions or deformities.  Otoscopic examination reveals clear canals, tympanic membranes are intact bilaterally without bulging, retraction, inflammation or discharge. Hearing  is grossly normal bilaterally. Nose:  External nasal examination shows no deformity or inflammation. Nasal mucosa are pink and moist without lesions or exudates. Mouth:  Oral mucosa and oropharynx without lesions or exudates.  Teeth in good repair. Neck:  No deformities, masses, or tenderness noted. Lungs:  Normal respiratory effort, chest expands symmetrically. Lungs are clear to auscultation, no crackles or wheezes. Heart:  normal rate and no murmur.   Msk:  normal ROM, no joint tenderness, no joint swelling, no joint warmth, no redness over joints, no joint deformities, no joint instability, and no crepitation.   Extremities:  No clubbing, cyanosis, edema, or deformity noted with normal full range of motion of all joints.   Neurologic:  cranial nerves II-XII intact, strength normal in all extremities, and gait normal.   Psych:  Oriented X3, normally interactive, good eye contact, not anxious appearing, and not depressed appearing.     Impression & Recommendations:  Problem # 1:  HYPERTENSION (ICD-401.9)  Her updated medication list for this problem includes:    Metoprolol Tartrate 25 Mg Tabs (Metoprolol tartrate) .Marland Kitchen... 1 by mouth two times a day    Norvasc 10 Mg Tabs (Amlodipine besylate) .Marland Kitchen... 1 once daily    Cozaar 100 Mg Tabs (Losartan potassium) .Marland Kitchen... 1 by mouth once daily    Hydrochlorothiazide 25 Mg Tabs (Hydrochlorothiazide) .Marland Kitchen... 1 by mouth once daily  BP today: 154/74 Prior BP: 130/78 (06/21/2010)  Labs Reviewed: K+: 3.8 (01/27/2010) Creat: : 0.7 (01/27/2010)   Chol: 115 (09/20/2007)   HDL: 34.2 (09/20/2007)   LDL: 56 (09/20/2007)   TG: 123 (09/20/2007)  Orders: EKG w/ Interpretation (93000)  Problem # 2:  DIZZINESS (ICD-780.4)  Orders: EKG w/ Interpretation (93000) Prescription Created Electronically [phone])  Complete Medication List: 1)  Metoprolol Tartrate 25 Mg Tabs (Metoprolol tartrate) .Marland Kitchen.. 1 by mouth two times a day 2)  Norvasc 10 Mg Tabs (Amlodipine  besylate) .Marland Kitchen.. 1 once daily 3)  Cozaar 100 Mg Tabs (Losartan potassium) .Marland Kitchen.. 1 by mouth once daily 4)  Hydrochlorothiazide 25 Mg Tabs (Hydrochlorothiazide) .Marland Kitchen.. 1 by mouth once daily 5)  Lantus 100 Unit/ml Soln (Insulin glargine) .... 70 units every evening 6)  Apidra 100 Unit/ml Inj Soln (Insulin glulisine) .... Sliding scale 7)  Synthroid 25 Mcg Tabs (Levothyroxine sodium) .Marland Kitchen.. 1 by mouth once daily**labs due now** 8)  Protonix 40 Mg Pack (Pantoprazole sodium) .Marland Kitchen.. 1 by mouth once daily 9)  Vytorin 10-40 Mg Tabs (Ezetimibe-simvastatin) .Marland Kitchen.. 1 by mouth once daily 10)  Diabetic Shoes  .... As directed  dx 250.00 11)  Ativan 0.5 Mg Tabs (Lorazepam) .Marland Kitchen.. 1 by mouth three times a day as needed anxiety 12)  Ultram 50 Mg Tabs (Tramadol hcl) .Marland Kitchen.. 1-2 by mouth every 6 hours as needed 13)  Diabetic Shoes  .Marland KitchenMarland Kitchen. 250.00 14)  Vicodin 5-500 Mg Tabs (Hydrocodone-acetaminophen) .Marland Kitchen.. 1-2 by mouth every 6 hours as needed 15)  Glucosamine-chondroitin- 153m  .... Once daily 16)  Zostavax 19400 Unt/0.670mSolr (Zoster vaccine live) ...Marland Kitchen 1 ml im x1 17)  Aspir-low 81 Mg Tbec (Aspirin) ...Marland Kitchen 1 by mouth once daily 18)  Flonase 50 Mcg/act Susp (Fluticasone propionate) .... 2  sprays each nostril once daily  Patient Instructions: 1)  274.9    401.9,  244.9 790.6  hgba1c  TSH, cbcd, bmp, lipid,  uric acid , hep  2)  Please schedule a follow-up appointment in 2 weeks.  Prescriptions: FLONASE 50 MCG/ACT SUSP (FLUTICASONE PROPIONATE) 2 sprays each nostril once daily  #1 x 0   Entered and Authorized by:   Garnet Koyanagi DO   Signed by:   Garnet Koyanagi DO on 07/04/2010   Method used:   Electronically to        [id number] (retail)       [address]       [place], [place]  [address]       Ph: [phone] or [phone]       Fax: [phone]   RxID:   [id number]    EKG  Procedure date:  07/04/2010  Findings:      Normal sinus rhythm with rate of:  60 bpm

## 2011-01-03 NOTE — Letter (Signed)
Summary: [facility]  [facility]   Imported By: Edmonia James 08/09/2010 12:57:52  _____________________________________________________________________  External Attachment:    Type:   Image     Comment:   External Document

## 2011-01-03 NOTE — Letter (Signed)
Summary: [facility]  [facility]   Imported By: Edmonia James 10/11/2010 11:14:30  _____________________________________________________________________  External Attachment:    Type:   Image     Comment:   External Document

## 2011-01-03 NOTE — Assessment & Plan Note (Signed)
Summary: B-12//PH   Nurse Visit   Allergies: 1)  ! Cipro 2)  ! Ceftin  Medication Administration  Injection # 1:    Medication: Vit B12 1000 mcg    Diagnosis: B12 DEFICIENCY (ICD-266.2)    Route: IM    Site: L deltoid    Exp Date: 02/02/2012    Lot #: [id number]    Mfr: [employer]    Patient tolerated injection without complications    Given by: Rolla Flatten CMA (October 10, 2010 9:09 AM)  Orders Added: 1)  Vit B12 1000 mcg [J3420] 2)  Admin of Therapeutic Inj  intramuscular or subcutaneous [29574]

## 2011-01-03 NOTE — Progress Notes (Signed)
Summary: norvasc ready   Phone Note Outgoing Call   Summary of Call: Called and informed pt that meds are here for her to pick up.  Initial call taken by: Allyn Kenner CMA,  December 08, 2009 2:01 PM

## 2011-01-03 NOTE — Progress Notes (Signed)
Summary: ATB's   Phone Note Call from Patient   Summary of Call: Pt stated that the Levaquin was going to be $300.00, is there something else we can call in for her?  Initial call taken by: Allyn Kenner CMA,  December 06, 2009 9:29 AM  Follow-up for Phone Call        bactrim ds 1 by mouth two times a day for 10 days ---- her ct should be today---how is she feeling ----she may not even need to get abx at this point---can wait until ct done Follow-up by: Garnet Koyanagi DO,  December 06, 2009 9:30 AM  Additional Follow-up for Phone Call Additional follow up Details #1::        pt is going to have the CT done tomorrow, she would like to wait on the ABX, she states she is feeling okay not great.  Additional Follow-up by: Allyn Kenner CMA,  December 06, 2009 10:13 AM

## 2011-01-03 NOTE — Assessment & Plan Note (Signed)
Summary: look at ankle//lch   Vital Signs:  Patient profile:   76 year old female Weight:      211.50 pounds Temp:     98.1 degrees F oral Pulse rate:   88 / minute Pulse rhythm:   regular BP sitting:   122 / 82  (left arm) Cuff size:   large  Vitals Entered By: Allyn Kenner CMA (February 07, 2010 2:03 PM) CC: Pt here to follow up on her ankle, doing better but still sore.   Allergies: 1)  ! Cipro 2)  ! Ceftin  Physical Exam  General:  Well-developed,well-nourished,in no acute distress; alert,appropriate and cooperative throughout examination Msk:  + swelling lat L ankle no pain with palpapation no errythema Extremities:  No clubbing, cyanosis, edema, or deformity noted with normal full range of motion of all joints.   Neurologic:  No cranial nerve deficits noted. Station and gait are normal. Plantar reflexes are down-going bilaterally. DTRs are symmetrical throughout. Sensory, motor and coordinative functions appear intact. Skin:  Intact without suspicious lesions or rashes Cervical Nodes:  No lymphadenopathy noted Psych:  Cognition and judgment appear intact. Alert and cooperative with normal attention span and concentration. No apparent delusions, illusions, hallucinations   Impression & Recommendations:  Problem # 1:  ANKLE PAIN, LEFT (OCO-505.67)  Orders: Orthopedic Surgeon Referral (Ortho Surgeon)  Complete Medication List: 1)  Metoprolol Tartrate 25 Mg Tabs (Metoprolol tartrate) .Marland Kitchen.. 1 by mouth two times a day 2)  Norvasc 10 Mg Tabs (Amlodipine besylate) .Marland Kitchen.. 1 once daily 3)  Cozaar 100 Mg Tabs (Losartan potassium) .Marland Kitchen.. 1 by mouth once daily 4)  Hydrochlorothiazide 25 Mg Tabs (Hydrochlorothiazide) .Marland Kitchen.. 1 by mouth once daily 5)  Lantus 100 Unit/ml Soln (Insulin glargine) .... 60 units every evening 6)  Apidra 100 Unit/ml Inj Soln (Insulin glulisine) .... Sliding scale 7)  Synthroid 25 Mcg Tabs (Levothyroxine sodium) .Marland Kitchen.. 1 by mouth once daily**labs due now** 8)   Protonix 40 Mg Pack (Pantoprazole sodium) .Marland Kitchen.. 1 by mouth once daily 9)  Vytorin 10-40 Mg Tabs (Ezetimibe-simvastatin) .Marland Kitchen.. 1 by mouth once daily 10)  Singulair 10 Mg Tabs (Montelukast sodium) .... As needed 11)  Dicyclomine Hcl 20 Mg Tabs (Dicyclomine hcl) .... As needed 12)  Diabetic Shoes  .... As directed  dx 250.00 13)  Ativan 0.5 Mg Tabs (Lorazepam) .Marland Kitchen.. 1 by mouth three times a day as needed anxiety 14)  Ultram 50 Mg Tabs (Tramadol hcl) .Marland Kitchen.. 1-2 by mouth every 6 hours as needed 15)  Diabetic Shoes  .Marland KitchenMarland Kitchen. 250.00 16)  Vicodin 5-500 Mg Tabs (Hydrocodone-acetaminophen) .Marland Kitchen.. 1-2 by mouth every 6 hours as needed 17)  Keflex 500 Mg Caps (Cephalexin) .Marland Kitchen.. 1 by mouth qid for 10 days.

## 2011-01-03 NOTE — Miscellaneous (Signed)
Summary: Orders Update   Clinical Lists Changes  Orders: Added new Referral order of Radiology Referral (Radiology) - Signed

## 2011-01-03 NOTE — Progress Notes (Signed)
Summary: High BP  Phone Note Call from Patient   Summary of Call: Patient spouse called stating that the patient woke him during the early morning hours c/o dizziness and HA. He notes that at that time her BP was over 200. He notes that she is now laying down resting and her BP has returned to normal. He states that the patient is no longer having the symptoms above. I made the patient an appt for today, and made him aware that if symptoms return/worsen the patient must go for eval at the ER. He expressed understanding. Initial call taken by: Ernestene Mention CMA,  November 07, 2010 9:33 AM

## 2011-01-03 NOTE — Assessment & Plan Note (Signed)
Summary: RTO /CBS   Vital Signs:  Patient profile:   76 year old female Weight:      211.8 pounds Temp:     98.6 degrees F oral Pulse (ortho):   64 / minute Pulse rhythm:   regular BP sitting:   132 / 78  (left arm) Cuff size:   large  Vitals Entered By: Aron Baba CMA Deborra Medina) (September 08, 2010 3:16 PM) CC: 1 MO F/U ON B-12   History of Present Illness: Pt here f/u b12 and to get refills on meds.  Pt still c/o pain under L ribs.  Pain had gone away for a while but is now back.    Current Medications (verified): 1)  Metoprolol Tartrate 25 Mg Tabs (Metoprolol Tartrate) .Marland Kitchen.. 1 By Mouth Two Times A Day 2)  Norvasc 10 Mg  Tabs (Amlodipine Besylate) .Marland Kitchen.. 1 Once Daily 3)  Cozaar 100 Mg  Tabs (Losartan Potassium) .Marland Kitchen.. 1 By Mouth Once Daily 4)  Hydrochlorothiazide 25 Mg  Tabs (Hydrochlorothiazide) .Marland Kitchen.. 1 By Mouth Once Daily 5)  Lantus 100 Unit/ml  Soln (Insulin Glargine) .... 70 Units Every Evening 6)  Apidra 100 Unit/ml Inj Soln (Insulin Glulisine) .... Sliding Scale 7)  Synthroid 25 Mcg  Tabs (Levothyroxine Sodium) .Marland Kitchen.. 1 By Mouth Once Daily**labs Due Now** 8)  Protonix 40 Mg  Pack (Pantoprazole Sodium) .Marland Kitchen.. 1 By Mouth Once Daily 9)  Vytorin 10-20 Mg Tabs (Ezetimibe-Simvastatin) .... Take 1 Tab Once Daily 10)  Diabetic Shoes .... As Directed  Dx 250.00 11)  Ativan 0.5 Mg Tabs (Lorazepam) .Marland Kitchen.. 1 By Mouth Three Times A Day As Needed Anxiety 12)  Ultram 50 Mg Tabs (Tramadol Hcl) .Marland Kitchen.. 1-2 By Mouth Every 6 Hours As Needed 13)  Diabetic Shoes .Marland KitchenMarland Kitchen. 250.00 14)  Vicodin 5-500 Mg Tabs (Hydrocodone-Acetaminophen) .Marland Kitchen.. 1-2 By Mouth Every 6 Hours As Needed 15)  Glucosamine-Chondroitin- 1561m .... Once Daily 16)  Zostavax 19400 Unt/0.68mSolr (Zoster Vaccine Live) ...Marland Kitchen 1 Ml Im X1 17)  Aspir-Low 81 Mg Tbec (Aspirin) ...Marland Kitchen 1 By Mouth Once Daily 18)  Flonase 50 Mcg/act Susp (Fluticasone Propionate) .... 2 Sprays Each Nostril Once Daily 19)  Vitamin D3 50000 Unit Caps (Cholecalciferol) ...Marland Kitchen 1 By  Mouth Weekly 20)  Carafate 1 Gm Tabs (Sucralfate)  Allergies (verified): 1)  ! Cipro 2)  ! Ceftin  Past History:  Past medical, surgical, family and social histories (including risk factors) reviewed for relevance to current acute and chronic problems.  Past Medical History: Reviewed history from 06/21/2010 and no changes required. Asthma Diabetes mellitus, type II Osteopenia Current Problems:  NEPHRECTOMY, HX OF (ICD-V45.73) CARCINOMA, RENAL CELL (ICD-189.0) LOW BACK PAIN, ACUTE (ICD-724.2) NECK PAIN, LEFT (ICD-723.1) WOUND, FINGER (ICD-883.0) PARESTHESIA (ICD-782.0) LUMP OR MASS IN BREAST, R (ICD-611.72) BREAST PAIN, RIGHT (ICD-611.71) ABDOMINAL PAIN (ICD-789.00) CALCULUS, KIDNEY (ICD-592.0) GOITER NOS (ICD-240.9) OSTEOPENIA (ICD-733.90) DIABETES MELLITUS, TYPE II (ICD-250.00) ASTHMA (ICD-493.90) Hypertension  Family History: Reviewed history from 12/02/2009 and no changes required. s- bladder ca f- renal ca Family History of CAD Female 1st degree relative 8080samily History Diabetes 1st degree relative Family History High cholesterol Family History Hypertension  Social History: Reviewed history from 12/02/2009 and no changes required. Never Smoked Alcohol use-no Drug use-no  Review of Systems      See HPI  Physical Exam  General:  Well-developed,well-nourished,in no acute distress; alert,appropriate and cooperative throughout examination Abdomen:  Bowel sounds positive,abdomen soft and non-tender without masses, organomegaly or hernias noted. Psych:  Cognition and judgment appear intact. Alert  and cooperative with normal attention span and concentration. No apparent delusions, illusions, hallucinations   Impression & Recommendations:  Problem # 1:  B12 DEFICIENCY (ICD-266.2)  Orders: Venipuncture (01779) TLB-B12 + Folate Pnl (39030_[id number]-A07/MAU)  Problem # 2:  HYPERTENSION (ICD-401.9)  Her updated medication list for this problem includes:     Metoprolol Tartrate 25 Mg Tabs (Metoprolol tartrate) .Marland Kitchen... 1 by mouth two times a day    Norvasc 10 Mg Tabs (Amlodipine besylate) .Marland Kitchen... 1 once daily    Cozaar 100 Mg Tabs (Losartan potassium) .Marland Kitchen... 1 by mouth once daily    Hydrochlorothiazide 25 Mg Tabs (Hydrochlorothiazide) .Marland Kitchen... 1 by mouth once daily  Problem # 3:  ABDOMINAL PAIN (ICD-789.00)  f/u GI  Orders: Specimen Handling (99000)  Discussed symptom control with the patient.   Problem # 4:  DIABETES MELLITUS, TYPE II (ICD-250.00)  Her updated medication list for this problem includes:    Cozaar 100 Mg Tabs (Losartan potassium) .Marland Kitchen... 1 by mouth once daily    Lantus 100 Unit/ml Soln (Insulin glargine) .Marland KitchenMarland KitchenMarland KitchenMarland Kitchen 70 units every evening    Apidra 100 Unit/ml Inj Soln (Insulin glulisine) ..... Sliding scale    Aspir-low 81 Mg Tbec (Aspirin) .Marland Kitchen... 1 by mouth once daily  Labs Reviewed: Creat: 0.7 (07/13/2010)    Reviewed HgBA1c results: 9.5 (07/13/2010)  8.5 (09/20/2007)  Problem # 5:  HYPERLIPIDEMIA (ICD-272.4)  Her updated medication list for this problem includes:    Vytorin 10-20 Mg Tabs (Ezetimibe-simvastatin) .Marland Kitchen... Take 1 tab once daily  Labs Reviewed: SGOT: 36 (07/13/2010)   SGPT: 39 (07/13/2010)   HDL:35.00 (07/13/2010), 34.2 (09/20/2007)  LDL:54 (07/13/2010), 56 (09/20/2007)  Chol:115 (07/13/2010), 115 (09/20/2007)  Trig:128.0 (07/13/2010), 123 (09/20/2007)  Complete Medication List: 1)  Metoprolol Tartrate 25 Mg Tabs (Metoprolol tartrate) .Marland Kitchen.. 1 by mouth two times a day 2)  Norvasc 10 Mg Tabs (Amlodipine besylate) .Marland Kitchen.. 1 once daily 3)  Cozaar 100 Mg Tabs (Losartan potassium) .Marland Kitchen.. 1 by mouth once daily 4)  Hydrochlorothiazide 25 Mg Tabs (Hydrochlorothiazide) .Marland Kitchen.. 1 by mouth once daily 5)  Lantus 100 Unit/ml Soln (Insulin glargine) .... 70 units every evening 6)  Apidra 100 Unit/ml Inj Soln (Insulin glulisine) .... Sliding scale 7)  Synthroid 25 Mcg Tabs (Levothyroxine sodium) .Marland Kitchen.. 1 by mouth once daily**labs due now** 8)   Protonix 40 Mg Pack (Pantoprazole sodium) .Marland Kitchen.. 1 by mouth once daily 9)  Vytorin 10-20 Mg Tabs (Ezetimibe-simvastatin) .... Take 1 tab once daily 10)  Diabetic Shoes  .... As directed  dx 250.00 11)  Ativan 0.5 Mg Tabs (Lorazepam) .Marland Kitchen.. 1 by mouth three times a day as needed anxiety 12)  Ultram 50 Mg Tabs (Tramadol hcl) .Marland Kitchen.. 1-2 by mouth every 6 hours as needed 13)  Diabetic Shoes  .Marland KitchenMarland Kitchen. 250.00 14)  Vicodin 5-500 Mg Tabs (Hydrocodone-acetaminophen) .Marland Kitchen.. 1-2 by mouth every 6 hours as needed 15)  Glucosamine-chondroitin- 1522m  .... Once daily 16)  Zostavax 19400 Unt/0.625mSolr (Zoster vaccine live) ...Marland Kitchen 1 ml im x1 17)  Aspir-low 81 Mg Tbec (Aspirin) ...Marland Kitchen 1 by mouth once daily 18)  Flonase 50 Mcg/act Susp (Fluticasone propionate) .... 2 sprays each nostril once daily 19)  Vitamin D3 50000 Unit Caps (Cholecalciferol) ...Marland Kitchen 1 by mouth weekly 20)  Carafate 1 Gm Tabs (Sucralfate)  Other Orders: TLB-BMP (Basic Metabolic Panel-BMET) ([id number]-CBC Platelet - w/Differential (85025-CBCD) TLB-Lipase (83690-LIPASE) TLB-Amylase (82150-AMYL)  Patient Instructions: 1)  labs 3 months  250.00 272.4  401.9  [facility]  266.2 B12 Prescriptions: VITAMIN D3 50000 UNIT  CAPS (CHOLECALCIFEROL) 1 by mouth weekly  #4 x 0   Entered and Authorized by:   Garnet Koyanagi DO   Signed by:   Garnet Koyanagi DO on 09/08/2010   Method used:   Historical   RxID:   [id number] NORVASC 10 MG  TABS (AMLODIPINE BESYLATE) 1 once daily  #90 x 3   Entered and Authorized by:   Garnet Koyanagi DO   Signed by:   Garnet Koyanagi DO on 09/08/2010   Method used:   Print then Give to Patient   RxID:   [id number] VYTORIN 10-20 MG TABS (EZETIMIBE-SIMVASTATIN) TAKE 1 TAB once daily  #90 x 3   Entered and Authorized by:   Garnet Koyanagi DO   Signed by:   Garnet Koyanagi DO on 09/08/2010   Method used:   Print then Give to Patient   RxID:   [id number]

## 2011-01-03 NOTE — Letter (Signed)
Summary: [facility]   Imported By: Edmonia James 05/24/2010 10:30:06  _____________________________________________________________________  External Attachment:    Type:   Image     Comment:   External Document

## 2011-01-03 NOTE — Progress Notes (Signed)
Summary: Phone  Phone Note Call from Patient Call back at Home Phone [phone]   Caller: Patient Summary of Call: Patient is concerned about the infection in ankle  patient states she is a diabetic. Patient is concerned about it because she diabetic. Please advise Offered patient appt but refused. Initial call taken by: Silva Bandy,  January 31, 2010 11:26 AM  Follow-up for Phone Call        is reddness and swelling worse?   increase keflex to qid--- and make ov to recheck.   Follow-up by: Garnet Koyanagi DO,  January 31, 2010 4:04 PM  Additional Follow-up for Phone Call Additional follow up Details #1::        Pt states that it is not worse but it is not better, she will call tomorrow to make appt. [place]  January 31, 2010 4:37 PM     New/Updated Medications: KEFLEX 500 MG CAPS (CEPHALEXIN) 1 by mouth qid for 10 days.

## 2011-01-03 NOTE — Letter (Signed)
Summary: [facility]  [facility]   Imported By: Edmonia James 03/22/2010 09:55:23  _____________________________________________________________________  External Attachment:    Type:   Image     Comment:   External Document

## 2011-01-05 NOTE — Consult Note (Signed)
Summary: [facility]  [facility]   Imported By: Edmonia James 11/29/2010 10:05:49  _____________________________________________________________________  External Attachment:    Type:   Image     Comment:   External Document

## 2011-01-05 NOTE — Progress Notes (Signed)
Summary: Lorazepam--says she got "one-half" of a prescription 1/16  Phone Note Refill Request Call back at Home Phone [phone] Message from:  Patient on December 19, 2010 9:47 AM  Refills Requested: Medication #1:  ATIVAN 0.5 MG TABS 1 by mouth three times a day as needed anxiety patient called to question quantity---says she usually gets 60 pills per prescription, and this time she only got 30---so she has paid the same amount as a 60 pill prescription, but only got half as much------uses [facility] on [address], [place], [place]  Next Appointment Scheduled: none Initial call taken by: Berneta Sages,  December 19, 2010 9:49 AM  Follow-up for Phone Call        per pharmacist pt has already picked up the 30 we would have to send in another script when needed..... Aron Baba CMA Deborra Medina)  December 19, 2010 11:20 AM   Left message to call back on vm.... Aron Baba CMA Deborra Medina)  December 19, 2010 11:21 AM   Additional Follow-up for Phone Call Additional follow up Details #1::        spoke with patient and she stated that the pharmacist told her if we called in an addt'l 30 pills there would be no charge. I called pharmacy and he stated that was ok, I  advised 30 pills were called in error and he will add the addt'l 30 with no charge Additional Follow-up by: Aron Baba CMA Deborra Medina),  December 19, 2010 1:23 PM

## 2011-01-05 NOTE — Letter (Signed)
Summary: [facility]  [facility]   Imported By: Edmonia James 12/19/2010 10:02:41  _____________________________________________________________________  External Attachment:    Type:   Image     Comment:   External Document

## 2011-01-05 NOTE — Letter (Signed)
Summary: [facility]  [facility]   Imported By: Edmonia James 11/29/2010 08:32:36  _____________________________________________________________________  External Attachment:    Type:   Image     Comment:   External Document

## 2011-01-20 ENCOUNTER — Telehealth: Payer: Self-pay | Admitting: Family Medicine

## 2011-01-25 NOTE — Progress Notes (Signed)
Summary: Request for Vicodin  Phone Note Refill Request Message from:  Patient on January 20, 2011 2:39 PM  Refills Requested: Medication #1:  VICODIN 5-500 MG TABS 1-2 by mouth every 6 hours as needed   Last Refilled: 06/21/2010 Call from patient and she stated she lost her RX forVicodin when it was given to her a few months ago. Wants to know if we can fax in an RX to [facility] [address]... c/b [id number]... please advise  Initial call taken by: Aron Baba CMA Deborra Medina),  January 20, 2011 2:40 PM  Follow-up for Phone Call        double check with pharmacy that it was not filled.----If not filled---refillx1 Follow-up by: Garnet Koyanagi DO,  January 20, 2011 3:48 PM  Additional Follow-up for Phone Call Additional follow up Details #1::        Never filled at pharmacy..OK'd per Dr.Lowne Additional Follow-up by: Aron Baba CMA Deborra Medina),  January 20, 2011 4:50 PM    Prescriptions: VICODIN 5-500 MG TABS (HYDROCODONE-ACETAMINOPHEN) 1-2 by mouth every 6 hours as needed  #30 x 0   Entered by:   Aron Baba CMA ([place])   Authorized by:   Garnet Koyanagi DO   Signed by:   Aron Baba CMA ([place]) on 01/20/2011   Method used:   Printed then faxed to ...       [facility]  [place] # [id number]* (retail)       [address]       [place], [place]  [address]       Ph: [phone] or [phone]       Fax: [phone]   RxID:   [id number]

## 2011-01-31 ENCOUNTER — Encounter: Payer: Self-pay | Admitting: Family Medicine

## 2011-02-09 ENCOUNTER — Telehealth: Payer: Self-pay | Admitting: Family Medicine

## 2011-02-09 NOTE — Letter (Signed)
Summary: [facility]  [facility]   Imported By: Laural Benes 02/03/2011 13:51:49  _____________________________________________________________________  External Attachment:    Type:   Image     Comment:   External Document

## 2011-02-14 NOTE — Progress Notes (Signed)
Summary: refill  Phone Note Refill Request Message from:  Fax from Pharmacy on February 09, 2011 3:29 PM  Refills Requested: Medication #1:  ATIVAN 0.5 MG TABS 1 by mouth three times a day as needed anxiety   Last Refilled: [facility]/13/2011 [facility] [address] - [id number]  Initial call taken by: Arbie [place],  February 09, 2011 3:30 PM  Follow-up for Phone Call        Last seen 11/07/10 and filled 01/13/11 please advise Follow-up by: Aron Baba CMA Deborra Medina),  February 09, 2011 4:01 PM  Additional Follow-up for Phone Call Additional follow up Details #1::        refill x1 Additional Follow-up by: Garnet Koyanagi DO,  February 09, 2011 5:05 PM    Prescriptions: ATIVAN 0.5 MG TABS (LORAZEPAM) 1 by mouth three times a day as needed anxiety  #60 x 0   Entered by:   Aron Baba CMA ([place])   Authorized by:   Garnet Koyanagi DO   Signed by:   Aron Baba CMA ([place]) on 02/10/2011   Method used:   Printed then faxed to ...       [facility]  [place] # [id number]* (retail)       [address]       [place], [place]  [address]       Ph: [phone] or [phone]       Fax: [phone]   RxID:   [id number]

## 2011-02-17 ENCOUNTER — Ambulatory Visit (INDEPENDENT_AMBULATORY_CARE_PROVIDER_SITE_OTHER): Payer: Medicare Other | Admitting: Family Medicine

## 2011-02-17 ENCOUNTER — Ambulatory Visit: Payer: Medicare Other | Admitting: Family Medicine

## 2011-02-17 ENCOUNTER — Encounter: Payer: Self-pay | Admitting: Family Medicine

## 2011-02-17 DIAGNOSIS — R319 Hematuria, unspecified: Secondary | ICD-10-CM | POA: Insufficient documentation

## 2011-02-17 DIAGNOSIS — N760 Acute vaginitis: Secondary | ICD-10-CM | POA: Insufficient documentation

## 2011-02-17 DIAGNOSIS — N390 Urinary tract infection, site not specified: Secondary | ICD-10-CM | POA: Insufficient documentation

## 2011-02-17 DIAGNOSIS — E538 Deficiency of other specified B group vitamins: Secondary | ICD-10-CM

## 2011-02-17 LAB — CONVERTED CEMR LAB
Bilirubin Urine: NEGATIVE
Glucose, Urine, Semiquant: >=1000
Protein, U semiquant: NEGATIVE
Urobilinogen, UA: 0.2
pH: 7.5

## 2011-02-23 ENCOUNTER — Telehealth: Payer: Self-pay | Admitting: Family Medicine

## 2011-02-23 NOTE — Telephone Encounter (Signed)
Pt would need ov for RX. I would recommend  mucinex or mucinex DM--- but if that is not working she can try claritin or zyrtec etc to dry up drainage,  Or delsym----ov if no relief

## 2011-02-23 NOTE — Telephone Encounter (Signed)
Pt aware and will try OTC meds for now..... KP

## 2011-02-23 NOTE — Telephone Encounter (Signed)
Please advise Pt not seen recently for this OV needed.Felecia Solomiya Pascale CMA

## 2011-03-02 NOTE — Assessment & Plan Note (Signed)
Summary: YEAST INFECTION,/RH.....   Vital Signs:  Patient profile:   76 year old female Height:      64 inches Weight:      204 pounds Temp:     98.4 degrees F oral Pulse rate:   76 / minute BP sitting:   140 / 70  (left arm)  Vitals Entered By: Rolla Flatten CMA (February 17, 2011 2:43 PM) CC: ?yeast infection, burning itching   History of Present Illness: 76 yo Dyer here today for ? yeast infxn.  reports vaginal burning x2-3 days.  sxs were worse today.  used left over vaginal gel to tx her sxs.  has burning when she urinates over the raw skin.  denies frequency, urgency, hesitancy.  Current Medications (verified): 1)  Metoprolol Tartrate 25 Mg Tabs (Metoprolol Tartrate) .Marland Kitchen.. 1 By Mouth Two Times A Day 2)  Norvasc 10 Mg  Tabs (Amlodipine Besylate) .Marland Kitchen.. 1 Once Daily 3)  Cozaar 100 Mg  Tabs (Losartan Potassium) .Marland Kitchen.. 1 By Mouth Once Daily 4)  Hydrochlorothiazide 25 Mg  Tabs (Hydrochlorothiazide) .Marland Kitchen.. 1 By Mouth Once Daily 5)  Lantus 100 Unit/ml  Soln (Insulin Glargine) .... 70 Units Every Evening 6)  Apidra 100 Unit/ml Inj Soln (Insulin Glulisine) .... Sliding Scale 7)  Synthroid 25 Mcg  Tabs (Levothyroxine Sodium) .Marland Kitchen.. 1 By Mouth Once Daily**labs Due Now** 8)  Protonix 40 Mg  Pack (Pantoprazole Sodium) .Marland Kitchen.. 1 By Mouth Once Daily 9)  Vytorin 10-20 Mg Tabs (Ezetimibe-Simvastatin) .... Take 1 Tab Once Daily 10)  Diabetic Shoes .... As Directed  Dx 250.00 11)  Ativan 0.5 Mg Tabs (Lorazepam) .Marland Kitchen.. 1 By Mouth Three Times A Day As Needed Anxiety 12)  Ultram 50 Mg Tabs (Tramadol Hcl) .Marland Kitchen.. 1-2 By Mouth Every 6 Hours As Needed 13)  Diabetic Shoes .Marland KitchenMarland Kitchen. 250.00 14)  Vicodin 5-500 Mg Tabs (Hydrocodone-Acetaminophen) .Marland Kitchen.. 1-2 By Mouth Every 6 Hours As Needed 15)  Glucosamine-Chondroitin- 1562m .... Once Daily 16)  Zostavax 19400 Unt/0.642mSolr (Zoster Vaccine Live) ...Marland Kitchen 1 Ml Im X1 17)  Aspir-Low 81 Mg Tbec (Aspirin) ...Marland Kitchen 1 By Mouth Once Daily 18)  Flonase 50 Mcg/act Susp (Fluticasone  Propionate) .... 2 Sprays Each Nostril Once Daily 19)  Vitamin D3 50000 Unit Caps (Cholecalciferol) ...Marland Kitchen 1 By Mouth Weekly 20)  Carafate 1 Gm Tabs (Sucralfate) 21)  Lotrisone 1-0.05 % Crea (Clotrimazole-Betamethasone) .... Apply Two Times A Day 22)  Nasonex 50 Mcg/act Susp (Mometasone Furoate) 23)  Astepro 0.15 % Soln (Azelastine Hcl) .... 2 Spray Each Nostril Once Daily  Allergies (verified): 1)  ! Cipro 2)  ! Ceftin  Review of Systems      See HPI  Physical Exam  General:  Well-developed,well-nourished,in no acute distress; alert,appropriate and cooperative throughout examination Genitalia:  deferred due to use of excessive vaginal gel   Impression & Recommendations:  Problem # 1:  VAGINITIS (ICD-616.10) Assessment New start diflucan for presumed yeast given pt's reported sxs.  deferred PE due to gel use.  if no sxs improvement will need to return for f/u.  Pt expresses understanding and is in agreement w/ this plan.  Problem # 2:  HEMATURIA UNSPECIFIED (ICD-599.70) Assessment: New UA showed hematuria.  will send urine for cx and hold on tx until results available. Orders: T-Culture, Urine ([id number] Complete Medication List: 1)  Metoprolol Tartrate 25 Mg Tabs (Metoprolol tartrate) ...Marland Kitchen 1 by mouth two times a day 2)  Norvasc 10 Mg Tabs (Amlodipine besylate) ...Marland Kitchen 1 once daily 3)  Cozaar 100 Mg  Tabs (Losartan potassium) .Marland Kitchen.. 1 by mouth once daily 4)  Hydrochlorothiazide 25 Mg Tabs (Hydrochlorothiazide) .Marland Kitchen.. 1 by mouth once daily 5)  Lantus 100 Unit/ml Soln (Insulin glargine) .... 70 units every evening 6)  Apidra 100 Unit/ml Inj Soln (Insulin glulisine) .... Sliding scale 7)  Synthroid 25 Mcg Tabs (Levothyroxine sodium) .Marland Kitchen.. 1 by mouth once daily**labs due now** 8)  Protonix 40 Mg Pack (Pantoprazole sodium) .Marland Kitchen.. 1 by mouth once daily 9)  Vytorin 10-20 Mg Tabs (Ezetimibe-simvastatin) .... Take 1 tab once daily 10)  Diabetic Shoes  .... As directed  dx 250.00 11)  Ativan  0.5 Mg Tabs (Lorazepam) .Marland Kitchen.. 1 by mouth three times a day as needed anxiety 12)  Ultram 50 Mg Tabs (Tramadol hcl) .Marland Kitchen.. 1-2 by mouth every 6 hours as needed 13)  Diabetic Shoes  .Marland KitchenMarland Kitchen. 250.00 14)  Vicodin 5-500 Mg Tabs (Hydrocodone-acetaminophen) .Marland Kitchen.. 1-2 by mouth every 6 hours as needed 15)  Glucosamine-chondroitin- 153m  .... Once daily 16)  Zostavax 19400 Unt/0.628mSolr (Zoster vaccine live) ...Marland Kitchen 1 ml im x1 17)  Aspir-low 81 Mg Tbec (Aspirin) ...Marland Kitchen 1 by mouth once daily 18)  Flonase 50 Mcg/act Susp (Fluticasone propionate) .... 2 sprays each nostril once daily 19)  Vitamin D3 50000 Unit Caps (Cholecalciferol) ...Marland Kitchen 1 by mouth weekly 20)  Carafate 1 Gm Tabs (Sucralfate) 21)  Lotrisone 1-0.05 % Crea (Clotrimazole-betamethasone) .... Apply two times a day 22)  Nasonex 50 Mcg/act Susp (Mometasone furoate) 23)  Astepro 0.15 % Soln (Azelastine hcl) .... 2 spray each nostril once daily  Other Orders: UA Dipstick w/o Micro (manual) (81002) Admin of Therapeutic Inj  intramuscular or subcutaneous (9([id number] B12 1000 mcg (J([id number]  Medication Administration  Injection # 1:    Medication: Vit B12 1000 mcg    Diagnosis: B12 DEFICIENCY (ICD-266.2)    Route: IM    Site: L deltoid    Exp Date: 02/02/2012    Lot #: [id number]    Mfr: [employer] Regent    Patient tolerated injection without complications    Given by: FeRolla FlattenMA (February 17, 2011 4:11 PM)  Orders Added: 1)  UA Dipstick w/o Micro (manual) [81002] 2)  Admin of Therapeutic Inj  intramuscular or subcutaneous [96372] 3)  Vit B12 1000 mcg [J3420] 4)  T-Culture, Urine [[id number]])  Est. Patient Level III [9[12197]  Laboratory Results   Urine Tests    Routine Urinalysis   Color: yellow Appearance: Clear Glucose: >=1000   (Normal Range: Negative) Bilirubin: negative   (Normal Range: Negative) Ketone: negative   (Normal Range: Negative) Spec. Gravity: 1.010   (Normal Range: 1.003-1.035) Blood: large   (Normal Range:  Negative) pH: 7.5   (Normal Range: 5.0-8.0) Protein: negative   (Normal Range: Negative) Urobilinogen: 0.2   (Normal Range: 0-1) Nitrite: negative   (Normal Range: Negative) Leukocyte Esterace: negative   (Normal Range: Negative)

## 2011-03-13 ENCOUNTER — Other Ambulatory Visit: Payer: Self-pay | Admitting: Family Medicine

## 2011-03-14 ENCOUNTER — Telehealth: Payer: Self-pay | Admitting: Family Medicine

## 2011-03-14 NOTE — Telephone Encounter (Signed)
Pt aware Rx was faxed this morning, she advised me that she just spoke with the pharmacy and they have it ready    KP

## 2011-03-14 NOTE — Telephone Encounter (Signed)
Last filled 02/10/11 and seen 02/17/11 please advise     KP

## 2011-03-14 NOTE — Telephone Encounter (Signed)
Needs refill for Ativan--she is out of meds---please call prescription into  [facility], [address], [place],

## 2011-03-14 NOTE — Telephone Encounter (Signed)
Faxed to [phone]    KP

## 2011-03-24 ENCOUNTER — Other Ambulatory Visit: Payer: Self-pay | Admitting: Family Medicine

## 2011-03-24 NOTE — Telephone Encounter (Signed)
Faxed     KP

## 2011-03-24 NOTE — Telephone Encounter (Signed)
Last seen 02/17/11 and filled 02/09/11 please advise--- Margaret Dyer Patient         KP

## 2011-04-05 ENCOUNTER — Encounter: Payer: Self-pay | Admitting: Family Medicine

## 2011-04-13 ENCOUNTER — Other Ambulatory Visit: Payer: Self-pay | Admitting: Family Medicine

## 2011-04-13 NOTE — Telephone Encounter (Signed)
Last seen 02/17/11 and filled 03/14/11 please advise    Kp

## 2011-04-14 MED ORDER — LORAZEPAM 0.5 MG PO TABS: 0.5000 mg | ORAL_TABLET | Freq: Three times a day (TID) | ORAL | Status: DC | PRN

## 2011-04-14 NOTE — Telephone Encounter (Signed)
Faxed.   KP 

## 2011-04-21 NOTE — Op Note (Signed)
[place]. [facility]  Patient:    Margaret Dyer, Margaret Dyer                        MRN: [id number] Proc. Date: 05/14/00 Adm. Date:  [id number] Disc. Date: [id number] Attending:  Kristine Royal                           Operative Report  PREOPERATIVE DIAGNOSIS:  Solitary kidney right with possible right ureteropelvic junction obstruction, right renal calculi.  POSTOPERATIVE DIAGNOSIS:  Solitary kidney right with possible right ureteropelvic junction obstruction, right renal calculi.  OPERATION PERFORMED:  Cystoscopy, retrograde pyelogram right, insertion of right ureteral stent.  SURGEON:  Shanda Bumps., M.D.  ANESTHESIA:  General.  INDICATIONS FOR PROCEDURE:  This 76 year old white female underwent a left nephrectomy September 2000 for stage I renal cell carcinoma without recurrence.  She has metabolically active stones in her right kidney and had a previous pyelithotomy in 1964.  On x-ray it looked like a mild UPJ obstruction.  She enters now for cysto, stent insertion prior to anticipated lithotripsy, scheduled for May 17, 2000.  DESCRIPTION OF PROCEDURE:  The patient was placed on the operating table in dorsal lithotomy position, prepped and draped with Betadine in the usual sterile fashion. After satisfactory induction of general anesthesia she well-appearing prepped and draped.  The cystoscope was inserted and the bladder inspected. The bladder was smooth, nontrabeculated and no bladder mucosal lesions.  The right orifice was noted and an open ended 6 ureteral catheter was passed.  An inclusive retrograde demonstrated no distal obstruction.  Normal ureter up to the level of the UPJ, where there is somewhat of a high insertion but good drainage.  The renal pelvis is quite floppy.  There is no obstruction noted.  The guide wire was then passed up through the open-ended catheter to the level of the kidney without difficulty and then  over this a 6 French x 26 cm length double-J stent was inserted without difficulty.  The guide wire was then removed.  Bladder drained. Scope removed.  Patient taken to recovery room in good condition.  She will be later discharged as an outpatient. DD:  05/14/00 TD:  05/16/00 Job: [id number]

## 2011-04-21 NOTE — Op Note (Signed)
NAME:  Margaret Dyer, Margaret Dyer                           ACCOUNT NO.:  [id number]   MEDICAL RECORD NO.:  [id number]                   PATIENT TYPE:  AMB   LOCATION:  ENDO                                 FACILITY:  [facility]   PHYSICIAN:  Ronald Lobo, M.D.                DATE OF BIRTH:  1935-07-20   DATE OF PROCEDURE:  07/27/2004  DATE OF DISCHARGE:                                 OPERATIVE REPORT   PROCEDURE:  Colonoscopy with biopsy.   INDICATIONS FOR PROCEDURE:  76 year old female with heme positive stool.   FINDINGS:  Two diminutive polyps removed.   PROCEDURE:  The nature, purpose, and risks of the procedure were familiar to  the patient and she provided written consent.  Sedation was Fentanyl 90 mcg  and Versed 9 mg IV without arrhythmias or desaturations.  The Olympus adult  video colonoscope was advanced without too much difficulty, after turning  the patient into the supine position.  It was a little bit difficult  entering the base of the cecum but we were able to do so briefly and get an  adequate look after which pull back was performed.  There was a 2-3 cm  erythematous sessile polyp just above the cecum removed by 1-2 cold biopsies  and the slightly larger polyp in the very distal rectum just above the  dentate line removed by several cold biopsies.  No other polyps were seen  and there was no evidence of cancer, colitis, vascular malformations, or  significant diverticulosis.  No source of heme positivity was seen.  Retroflex viewing and reinspection of the rectum were, otherwise,  unremarkable.  The patient tolerated the procedure well and there were no  apparent complications.   IMPRESSION:  Small colon polyps removed as described above.   PLAN:  Await pathology results.                                               Ronald Lobo, M.D.    RB/MEDQ  D:  07/27/2004  T:  07/27/2004  Job:  [id number]   cc:   Wellington Hampshire, M.D. [facility]

## 2011-04-21 NOTE — Consult Note (Signed)
[place]. [facility]  Patient:    Margaret Dyer, Margaret Dyer                        MRN: [id number] Proc. Date: 05/30/00 Adm. Date:  [id number] Disc. Date: [id number] Attending:  Kristine Royal Dictator:   Lillette Boxer. Dahlstedt, M.D.                          Consultation Report  REASON FOR CONSULTATION:  Right flank pain, solitary kidney.  BRIEF HISTORY:  A 76 year old female who was seen in the office earlier today.  She underwent lithotripsy two to three weeks ago.  She has a solitary right kidney and had a right ureteral stone.  Her stent was extracted, as the KUB showed no evidence of further calculi.  Several minutes after her stent was extracted, while she was out of the office, she began having right flank pain and nausea.  She had no fever or chills.  She also could not void.  She presented to the emergency room for further evaluation and pain management.  PAST MEDICAL HISTORY:  Significant for left nephrectomy in September 2000 for renal cell carcinoma.  She has had prior kidney stones.  EVALUATION IN THE EMERGENCY ROOM:  VITAL SIGNS:  Blood pressure 169/81, pulse 86, respiratory rate 24, temperature 98.8.  ABDOMEN:  She had minimal right CVA tenderness.  No abdominal masses were noted.  By the time of her exam, she had had 30 mg of Toradol intravenously.  IMPRESSION: 1. Right flank pain status post stent extraction, probably ureteral spasm    versus ureteral obstruction from a small fragment.  The patient is pain    free now. 2. Solitary right kidney. 3. Left renal cell carcinoma status post nephrectomy September 2000 with    no evidence for reoccurrence.  PLAN: 1. Reassurance. 2. Vioxx and Vicodin on an as-needed basis. 3. Call back to the office today if she has persistent symptoms.  May to have    the stent replaced if she notices decreased urinary output and recurrent    flank pain. DD:  05/30/00 TD:  05/30/00 Job:  [id number]

## 2011-04-21 NOTE — H&P (Signed)
Margaret Dyer, Margaret Dyer                 ACCOUNT NO.:  [id number]   MEDICAL RECORD NO.:  [id number]          PATIENT TYPE:  INP   LOCATION:  1601                         FACILITY:  [facility]   PHYSICIAN:  Jacqulyn Ducking, M.D.  DATE OF BIRTH:  31-Mar-1935   DATE OF ADMISSION:  01/03/2005  DATE OF DISCHARGE:  LH                                HISTORY & PHYSICAL   REFERRING PHYSICIAN:  Kathalene Frames, M.D.   PRIMARY CARE PHYSICIAN:  Wellington Hampshire, M.D.  Primary cardiologist, Dr.  Olevia Perches.   HISTORY OF PRESENT ILLNESS:  A 76 year old woman with no known coronary  disease, but multiple cardiovascular risks factors presenting with recurrent  chest discomfort.  Ms. Ra has long-standing diabetes requiring insulin,  hypertension and hyperlipidemia.  She has a modest history of tobacco use in  the past, but none recently.  She was admitted to [facility] in  March 2005, with atypical chest pain.  Subsequently adenosine Cardiolite  study was negative prompting no significant intervention other than for  management of risk factors.  Since that time, she has had intermittent chest  heaviness.  She describes a moderately intense, dull, mid substernal  discomfort that is exacerbated by exertion.  There is associated dyspnea, in  fact, dyspnea may be the principle symptom.  All problems are relieved with  rest.  She has occasional left shoulder discomfort unrelated to her chest  pain.  Her husband is currently a patient on our service at [facility].  She described an increase in chest discomfort to her daughter  today prompting assessment in the emergency department.  She was initially  treated with aspirin and intravenous nitroglycerin.  Her chest discomfort is  currently minimal.   All the above risk factors have been under treatment by Dr. Cherlynn Kaiser with  reportedly good control.   PAST MEDICAL HISTORY:  1.  Intermittent asthma which has not been a problem recently.  2.  History of  abnormal liver function studies.  3.  Left nephrectomy 5 years ago due to neoplastic disease.  4.  Lithotripsy of the right kidney for nephrolithiasis.  5.  Hypothyroidism.  6.  Remote hysterectomy and appendectomy.   MEDICATIONS:  1.  Metoprolol 25 mg b.i.d.  2.  Cozaar 100 mg q.d.  3.  HCTZ 25 mg q.d.  4.  Amlodipine 10 mg q.d.  5.  Amaryl 4 mg b.i.d.  6.  Glucovance 500 mg b.i.d.  7.  Lantus 50 units q.d.  8.  Levothyroxine 0.25 mg q.d.  9.  Protonix 40 mg b.i.d.  10. Zetia 10 mg q.d.  11. Singulair.  12. Advair.  13. Dicyclomine p.r.n.   ALLERGIES:  No known drug allergies.   SOCIAL HISTORY:  Denies excessive use of alcohol.   FAMILY HISTORY:  Both parents had coronary disease manifesting in their 50s.   REVIEW OF SYSTEMS:  GASTROINTESTINAL:  Notable for GERD symptoms.  RESPIRATORY:  She has been followed by Dr. Melvyn Novas for a 5 mm, noncalcified,  right upper lobe lung nodule with CT last performed in November 2004.  All  other systems reviewed and are negative.   PHYSICAL EXAMINATION:  GENERAL:  Very pleasant, overweight woman in no acute  distress.  VITAL SIGNS:  Temperature 97, heart rate 75 and regular, respirations 18,  blood pressure 150/70.  O2 saturations 99% on room air.  HEENT:  Anicteric sclerae, normal lids and conjunctivae.  Pupils equal round  and reactive to light.  NECK:  No jugular venous distention.  Normal carotid upstrokes without  bruits.  ENDOCRINE:  No thyromegaly.  HEMATOPOIETIC:  No cervical, axillary or inguinal adenopathy.  SKIN:  Cherry angiomata and a few skin tags.  CARDIAC:  Normal S1, S2.  S4 present.  LUNGS:  Clear.  ABDOMEN:  Soft, nontender.  No organomegaly.  EXTREMITIES:  Normal distal pulses.  No edema.  NEUROMUSCULAR:  Symmetric strength and tone.  MUSCULOSKELETAL:  No joint deformities.   LABORATORY DATA AND X-RAY FINDINGS:  EKG with normal sinus rhythm, low  voltage in the chest leads, somewhat delayed R wave progression,  otherwise  unremarkable.   Other laboratory studies pending.   IMPRESSION:  Ms. Cassara presents with continuing chest discomfort that is  somewhat atypical, but certainly consistent with myocardial ischemia.  At  the present time, she is virtually free of symptoms.  With a negative stress  Cardiolite study less than 1 year and a normal electrocardiogram, her risk  for significant coronary disease is relatively low.   PLAN:  We will continue intravenous nitroglycerin since that has afforded  some relief, but defer anticoagulation for the time being.  Serial cardiac  markers and EKGs will be obtained.  The risks and benefits of coronary  angiography  were discussed with her.  This study is warranted for definite diagnosis and  to optimize therapy.  She agrees to proceed.  Treatment of other  cardiovascular risk factors will be optimized.  A D-dimer level, TSH,  hemoglobin A1C level and lipid profile will be obtained with adjustment of  medical therapy as appropriate.      RR/MEDQ  D:  01/04/2005  T:  01/04/2005  Job:  [id number]

## 2011-04-21 NOTE — H&P (Signed)
NAMEJOSEFA, SYRACUSE                           ACCOUNT NO.:  [id number]   MEDICAL RECORD NO.:  [id number]                   PATIENT TYPE:  INP   LOCATION:  5409                                 FACILITY:  [facility]   PHYSICIAN:  Kirk Ruths, M.D.                DATE OF BIRTH:  10-24-1935   DATE OF ADMISSION:  02/06/2004  DATE OF DISCHARGE:  02/07/2004                                HISTORY & PHYSICAL   PRIMARY CARDIOLOGIST:  Eustace Quail, M.D.   PRIMARY CARE PHYSICIAN:  Wellington Hampshire, M.D. [facility]   CHIEF COMPLAINT:  Chest pain.   HISTORY OF PRESENT ILLNESS:  A 76 year old female, obese, type 2 diabetes,  hypertension, hyperlipidemia (on Zetia refusing statins), status post left  nephrectomy, history of renal cell carcinoma (September, 2000), history of  seasonal asthma and chronic dyspnea on exertion, evaluated by Dr. Olevia Perches  with a nuclear perfusion study which was normal per patient in October  of 2004, no other cardiac history.  The patient reports activity limited by  chronic dyspnea on exertion, obesity, and bad left knee, status post  arthroscopy with possible need for future knee replacement (per patient).  The patient is unable to exercise on the treadmill.  She had an episode of  shortness of breath at 3:30 p.m. on February 06, 2004, with episodes of  breathlessness in the past.  However, this time the breathlessness was  accompanied by left-sided chest heaviness which radiated toward her left  shoulder, but not down her arm or into her neck.  The patient denied  associated symptoms of nausea, vomiting or diaphoresis.  She rated her chest  heaviness as 5-6/10.  She reports coming to the emergency room at 5 p.m.  She refused nitroglycerin because it gives me a headache.  The pain  gradually improved without intervention to a level of 1-2/10.  Serial CK-MB  and troponins were negative x3.  The patient's EKG revealed no evidence of  ischemia.  The patient had reported that her blood  pressure was elevated by  a value checked on her home machine.  However, her blood pressure obtained  in the emergency room revealed normal values with no blood pressure  intervention.  A chest x-ray revealed mild vascular congestion.   On review of the patient's past medical history, a 5 mm right upper lobe  nodule was noted on CT in November of 2004 with recommendations for followup  CT (incidental finding).   PAST MEDICAL HISTORY:  1. Seasonal asthma with a history of environmental allergies.  2. History of migraine headaches.  3. Obesity/ type 2 diabetes x4 years.  4. Creatinine 1.0 status post left nephrectomy for renal cell carcinoma     (September 2000).  5. Hypertension.  6. Hyperlipidemia, refuses statins because I do not want anything going     through my liver.  Currently on Zetia.  7. History of osteopenia.  8. History of gastroesophageal reflux disease symptoms.  9. History of hypothyroidism.  10.      History of nephrolithiasis, status post lithotripsy (June, 2001).  11.      Status post hysterectomy, appendectomy, hemorrhoidectomy, left-knee     arthroscopy.  12.      History of 5 mm right upper lobe nodule discovered incidentally on     CT scan (November 2004) with recommendations for future followup to     insure stability.   SOCIAL HISTORY:  The patient lives in [place] with her husband.  She is  married.  She has two children.  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   FAMILY HISTORY:  The patient's mother died at age 55 of myocardial  infarction.  No previous cardiac history prior to her death.   The patient's father died at age 50 of esophageal cancer.  He did have  myocardial infarction in his 36's  with bypass surgery, and apparently died  subsequent to that.  The patient has two sisters with hypertension.  He has  one brother with Barrett's esophagus.   REVIEW OF SYSTEMS:  Essentially negative other than that which is described  in HPI.   Positive for headache, chest pain, shortness of breath and dyspnea  on exertion.  The patient has had no auditory or visual acuity changes.  He  had no alterations to bowel or bladder habits.  He has had no recent weight  gain or weight loss.  He denies any acute rash.  Neuropsychological status  is stable.   ALLERGIES:  Adverse reaction to CEFTIN, causes stomach upset.   The patient refuses statins (no allergies or adverse reactions).   MEDICATIONS:  1. Cozaar 100 mg p.o. daily.  2. Hydrochlorothiazide 25 mg p.o. daily.  3. Synthroid 25 micrograms p.o. daily.  4. Amaryl 8 mg p.o. daily.  5. Cardura 4 mg p.o. daily.  6. Zetia 10 mg p.o. daily.  7. Coreg 25 mg p.o. b.i.d.  8. Protonix 40 mg p.o. b.i.d.  9. Lantus 35 units, subcu q.p.m.  10.      Singulair 10 mg p.o. daily, p.r.n.  11.      Advair inhaler, p.r.n.   PHYSICAL EXAMINATION:  VITAL SIGNS:  Temperature 97.1, heart rate 68,  respiratory rate 18, blood pressure 139/65, oxygen saturation 98% on two  liters by nasal cannula.  GENERAL:  The patient is a pleasant and cooperative, and answers questions  appropriately.  She is obese and in no apparent distress.  HEENT:  She is normocephalic, atraumatic.  Extraocular eye movements are  intact.  Oropharynx is pink, moist, without lesions.  NECK:  Supple, carotid pulses are 2+ bilaterally.  There is no jugular  venous distension.  There is no carotid bruits.  CARDIAC:  Exam reveals a regular S1 and a regular S2 without murmur.  LUNGS:  Clear to auscultation bilateral.  SKIN:  Examination reveals no acute rash.  ABDOMEN:  Exam is obese, positive bowel sounds, soft, nontender.  EXTREMITIES:  Examination reveals no lower-extremity edema.  Femoral pulses  are 2+ and symmetric bilaterally.  There are no femoral bruits noted.  Distal pulses are 2+ and symmetric bilaterally.  The patient is able to move  all four extremities without difficulty. NEUROLOGIC:  Exam is grossly intact.  The  patient is grossly intact  throughout.   STUDIES:  1. Chest x-ray:  Mild vascular congestion.  2. EKG:  Normal sinus rhythm with a rate  of 64, normal axis, normal PR     intervals, normal QRS intervals. QTC 438.  No Q waves.  No ischemic     changes noted.   LABORATORY DATA:  Hemoglobin 14, hematocrit 41, sodium 138, potassium 3.7,  chloride 102, bicarbonate 31, BUN 21, creatinine 1.0, glucose 174, anion gap  5.   CK-MB 1.3, 1.6, 1.3.  Troponin I less than 0.05 x3.  Myoglobin 75, 70, 80.   ASSESSMENT/PLAN:  1. Atypical chest pain with negative EKG and three negative cardiac markers     with a history of a negative stress test (per patient by Dr. Olevia Perches     October 04).  Cardiac risk factors of age, obesity, type 2 diabetes,     hypertension, hyperlipidemia.  We will continue to cycle cardiac markers     overnight, exclusion of myocardial infarction with serial EKG's and     telemetry.  2. If EKG, symptoms, labs continue to be negative overnight, a pharmacologic     stress test (for example, a dobutamine Cardiolite) would be reasonable     for further risk stratification.  3. Hyperlipidemia.  We will continue Zetia as previously described.  We will     check a lipid profile in the morning.  The patient refuses statins (as     above).  4. Type 2 diabetes/ obesity.  Continue Lantus as previously prescribed.     Hold Amaryl while the patient is in the hospital, N.P.O. after midnight     orders.  Use sliding scale insulin and diabetic diet to optimize glycemic     control.  We will check a hemoglobin A1c in the morning.  We will     encourage weight loss.  5. History of a 5 mm nodule on chest CT in the patient's right upper lobe.     The patient will follow up this finding with her PCP.  May consider     repeat CT scan to confirm stability of this incidental finding.  6. Hypertension.  We will continue the patient on her unusual regimen of     Coreg and Cardura.  The patient reports  that the Cardura was removed for     awhile; however, it had to be added back to maintain adequate blood     pressure control. Consideration of this continuation of Cardura with     replacement of other medications such as Norvasc could also be     considered.  However, may refer this to the patient's primary care     physician in light of what is likely to be a relatively short stay in the     hospital for her atypical chest pain and further risk stratification as     prescribed previously.  We will continue hydrochlorothiazide and Cozaar     as previously described to obtain a goal blood pressure of less than     135/85.  7. History of hypothyroidism.  We will continue Synthroid 25 micrograms p.o.     every day as previously described and check a TSH and free T4 with     morning labs to assure the patient is euthyroid on her current dose of     thyroid hormone supplementation. 8. Status post left nephrectomy.  We will continue Cozaar for renal     protective effect with her diabetes and avoid nephrotoxic agents whenever     possible.  9. History of seasonal asthma.  The patient reports that her asthma  is     not acting up right now and that she has not been using her Singulair     and Advair.  Therefore, we will not write for these agents during thi     hospitalization unless she requests.      March Rummage, MD                      Kirk Ruths, M.D.    DDH/MEDQ  D:  02/07/2004  T:  02/07/2004  Job:  [id number]

## 2011-04-21 NOTE — Op Note (Signed)
NAMENALLELY, YOST                           ACCOUNT NO.:  [id number]   MEDICAL RECORD NO.:  [id number]                   PATIENT TYPE:  AMB   LOCATION:  ENDO                                 FACILITY:  [facility]   PHYSICIAN:  Ronald Lobo, M.D.                DATE OF BIRTH:  [birth year]   DATE OF PROCEDURE:  02/29/2004  DATE OF DISCHARGE:                                 OPERATIVE REPORT   PROCEDURE:  Upper endoscopy.   INDICATIONS FOR PROCEDURE:  This is a 76 year old female with symptoms  suggestive of laryngopharyngeal reflux that have not responded to PPI  therapy but the patient has realistically not been able to remember to take  twice daily dosing most of the time, so she has only been getting one dose  most days.   FINDINGS:  Small hiatal hernia.  Focal distal esophagitis.  Unable to  visualize throat.   DESCRIPTION OF PROCEDURE:  The patient provided written consent for the  procedures.  Sedation was Fentanyl 50 mcg and Versed 5 mg IV without  arrhythmias or desaturation.   The Olympus small caliber adult video endoscope was passed under direct  vision.  The patient has a large tongue and was retching a moderate amount  during the procedure, so visualization of the hypopharynx and in particular  the larynx was not really possible.  For what it is worth, no overt  inflammatory changes or tumors were seen.  The scope was guided into the  region of the cricopharyngeus under direct vision and advanced into the  esophagus without difficulty.  The esophageal mucosa was essentially normal  except in the distal portion, where for a very short distance above a  slightly irregular Z-line, there was an area of exudate suggesting possible  focal esophagitis.  No erosions or ulcerations were seen.  There was no free  reflux.  I did not see any definite tongues of Barrett's mucosa, nor any  evidence of Neoplasia, infection, or varices.   The stomach contained no significant residual  and had normal mucosa without  evidence of gastritis, erosions, ulcers, polyps, or masses and the pyloris,  duodenal bulb, and second duodenum looked normal.  It did not appear that  there was enough abnormality in the distal esophagus to warrant biopsy.  Therefore, the scope was removed from the patient who tolerated the  procedure well and without apparent complications.  Her oxygen saturation  had transiently gone down to about 88% prior to insertion of the scope, but  there was no problem with significant desaturation or ___________ during the  course of this procedure.   It should be noted that retroflexion was performed during the examination  and showed a normal cardia without any significant hiatal hernia or other  abnormalities.   IMPRESSION:  Focal mild distal esophagitis, unable to evaluate hypopharynx  adequately.   PLAN:  1. Continue Protonix.  Try  to be more regular with twice daily dosing.  2. Consider ENT referral if symptoms persist.                                               Ronald Lobo, M.D.   RB/MEDQ  D:  02/29/2004  T:  02/29/2004  Job:  [id number]

## 2011-04-21 NOTE — Discharge Summary (Signed)
[place]. [facility]  Patient:    Margaret Dyer, Margaret Dyer Visit Number: [id number] MRN: [id number]          Service Type: MED Location: [id number] Attending Physician:  Wellington Hampshire. Dictated by:   Helayne Seminole, P.A. Admit Date:  03/26/2002 Discharge Date: 03/28/2002   CC:         Wellington Hampshire, M.D. [facility]   Discharge Summary  DISCHARGE DIAGNOSES:  1. Asthmatic exacerbation.  2. Hyperglycemia.  BRIEF ADMISSION HISTORY:  The patient is a 76 year old white female who was seen by Dr. Corky Downs, Jr. in the past couple of weeks for followup of nephrectomy.  CT scan at that time showed a questionable infiltrate versus atelectasis.  She was started on Levaquin on March 21, 2002.  Over the past two days prior to this admission she complained of shortness of breath, cough, congestion.  PAST MEDICAL HISTORY:  1. Hypertension.  2. Adult onset diabetes mellitus.  3. Asthma.  4. History of renal cell carcinoma status post nephrectomy.  5. Osteopenia.  6. Status post hysterectomy.  7. Status post appendectomy.  8. Status post hemorrhoidectomy.  HOSPITAL COURSE: #1 - Pulmonary.  The patient presented with asthmatic exacerbation.  She was started on Solu-Medrol, nebulizer treatments, and oxygen.  After 24 hours in the hospital her symptoms had improved.  The patient was tapered off of Solu-Medrol onto oral prednisone.  The patients oxygen was discontinued.  She was able to maintain saturations greater than 93% on room air even with exertion.  The patient was changed to oral antibiotics, and we will have her complete a full 10-day course.  The patient has requested for a nebulizer at home, and we will arrange for this.  #2 - Hyperglycemia with a history of adult onset diabetes mellitus.  The patients blood sugars were significantly elevated in the 300s and 400s, likely secondary to the Solu-Medrol she received.  As the patients Solu-Medrol was tapered to  oral prednisone, her blood sugars improved.  We did place her on a short course of insulin.  We will not  continue this at discharge.  LABORATORIES AT DISCHARGE:  CBC was normal.  BMET was normal except for a glucose of 180.  MEDICATIONS AT DISCHARGE:  1. Levaquin 500 mg on Saturday and _0 -day course.  2. Prednisone 10 mg two tablets b.i.d. for 2 days, then one tablet b.i.d.     for 2 days, then one tablet q.d. for 2 days, and then stop.  3. Cozaar 50 mg q.d.  4. Hydrochlorothiazide 12.5 mg q.d.  5. Nexium 40 mg q.d.  6. Synthroid 0.5 mg q.d.  7. Amaryl 5 mg two tablets q.d.  8. Avandia 4 mg q.d.  9. Estraderm patch 0.075 mg change twice a week. 10. Zyrtec as needed. 11. Cardura 6 mg q.d. 12. Zetia 10 mg q.d. 13. Xopenex 0.63 mg nebulizer q.4-6h. p.r.n.  FOLLOWUP:  Follow up with Dr. Cherlynn Kaiser in 1-2 weeks. Dictated by:   Helayne Seminole, P.A. Attending Physician:  Wellington Hampshire. DD:  03/28/02 TD:  03/29/02 Job: [id number]

## 2011-04-21 NOTE — Op Note (Signed)
[place]. [facility]  Patient:    Margaret Dyer, Margaret Dyer                        MRN: [id number] Proc. Date: 12/17/00 Adm. Date:  [id number] Attending:  Debroah Baller                           Operative Report  PREOPERATIVE DIAGNOSIS:  Left knee medial meniscus tear, possible chondromalacia.  POSTOPERATIVE DIAGNOSIS:  Left knee medial meniscus tear, chondromalacia grade 3 global of the medial femoral condyle, grade 3 focal medial tibial condyle, grade 3 global trochlea, grade 3 focal apex of patella.  OPERATION PERFORMED:  Left knee partial arthroscopic medial meniscectomy and debridement of chondromalacia.  SURGEON:  Debroah Baller, M.D.  ASSISTANT:  Nehemiah Massed, PA-C.  ANESTHESIA:  General endotracheal.  ESTIMATED BLOOD LOSS:  Minimal.  FLUID REPLACEMENT:  800 cc crystalloid.  DRAINS:  None.  TOURNIQUET TIME:  None.  INDICATIONS FOR PROCEDURE:  The patient is a 76 year old woman who was seen by one of my partners, Rhina Brackett, M.D. with a left knee effusion for a number of weeks.  This was back in December.  She underwent a cortisone injection, got only temporary relief for about week or so and then the effusion and pain along the medial jointline came back.  I saw her, felt that she had a probable medial meniscus tear that was refractory to conservative measures and she is taken for arthroscopic evaluation and treatment of her left knee.  Her past history is complicated by diabetes, high blood pressure. She only has one kidney and has had chronic urinary tract infections.  She is presently under treatment with Macrodantin and Pyridium for a UTI at this point and became asymptomatic over the weekend.  DESCRIPTION OF PROCEDURE:  The patient was identified by arm band and taken to the operating room at [facility] where the appropriate anesthetic monitors were attached and general endotracheal anesthesia induced with  the patient in the supine position.  She was given a gram of Ancef preoperatively because of the pyuria that she had last week and then a lateral post applied to the table and the left lower extremity prepped and draped in the usual sterile fashion from the ankle to the midthigh.  Using a #11 blade, standard inferomedial inferolateral peripatellar portals were then made allowing introduction of the arthroscope through the inferolateral portal and the outflow through the inferomedial portal.  We immediately identified grade 3 chondromalacia of the apex of the patella as well as globally of the trochlea. This was debrided back to stable margins using the 3.5 gator sucker shaver from [employer].  Moving to the medial compartment, degenerative tears of the medial meniscus were identified.  We could also see where the previous cortisone injection had a deposit on the medial meniscus and this was debrided back to stable margins as was global grade 3 chondromalacia of the medial femoral condyle and focal of the periphery of the medial tibial condyle.  The ACL and the PCL were intact. The lateral compartment had minimal chondromalacia and only a small anterior horn tear of the lateral meniscus, which was incidentally debrided.  The gutters were cleared, multiple cartilaginous loose bodies were taken through the outflow and all these were 1 or 2 mm in size.  The knee was washed out with normal saline solution and the arthroscopic instruments removed.  A dressing of Xeroform, 4 x 8 dressing sponges, Webril and an Ace wrap applied.  The patient was awakened and taken to the recovery room without difficulty. DD:  12/17/00 TD:  12/17/00 Job: 12/17/2000 [id number]

## 2011-04-21 NOTE — Discharge Summary (Signed)
Margaret Dyer, Margaret Dyer                 ACCOUNT NO.:  [id number]   MEDICAL RECORD NO.:  [id number]          PATIENT TYPE:  INP   LOCATION:  6962                         FACILITY:  [facility]   PHYSICIAN:  Jacqulyn Ducking, M.D.  DATE OF BIRTH:  07/16/1935   DATE OF ADMISSION:  01/03/2005  DATE OF DISCHARGE:  01/04/2005                           DISCHARGE SUMMARY - REFERRING   PROCEDURE:  Cardiac catheterization on January 04, 2005.   REASON FOR ADMISSION:  Margaret Dyer is a 76 year old female, patient of Dr.  Eustace Quail as is Margaret Dyer, Margaret Dyer (currently hospitalized here at [facility]) who, herself, has no known history of coronary artery  disease, but multiple cardiac risk factors and previous history of atypical  chest pain with negative pharmacologic stress testing, who was sent to the  emergency room, after visiting Margaret Dyer here in the hospital, for further  evaluation of recurrent chest pain.  Please refer to dictated admission note  for full details.   LABORATORY DATA:  Serial cardiac markers normal.  Lipid profile with total  cholesterol 183, triglycerides 230, HDL 36, LDL 101 (cholesterol/HDL ratio  5.5), TSH 0.53.  Sodium 137, potassium 3.9, on admission - potassium low  3.3, 3.5 at discharge.  BUN 22, creatinine 0.9 on admission.  Normal CBC on  admission.  Admission chest x-ray showed no acute disease.   HOSPITAL COURSE:  The patient was admitted for overnight observation and  further evaluation of recurrent chest pain, in the context of multiple  cardiac risk factors and previous negative stress Cardiolite, and maintained  on Margaret home medication regimen with the exception of hydrochlorothiazide.  Additionally, she was started on IV nitroglycerin in the emergency room and  intravenous heparin was deferred given initial cardiac markers which were  normal.  Subsequent serial cardiac markers were all within normal limits.  Nevertheless, given Margaret history of recurrent  chest discomfort and previous  negative workup absent of any previous cardiac catheterization,  recommendation was to proceed with diagnostic cardiac catheterization.   The following day, the patient underwent coronary angiography by Dr. Eustace Quail (see report for full details) revealing normal coronary arteries with  only minimal irregularity of the proximal left anterior descending; left  ventricular function was normal with no wall motion abnormalities and  ejection fraction 60%.  Additionally, distal aortogram revealed normal right  renal artery (status post left nephrectomy) and normal distal aorta and  common iliac arteries.  The right groin incision site was closed with  Starclose closure device.  There were no noted complications of the right  groin at the time of anticipated discharge later that evening; however,  owing to transportation difficulties, the patient was kept overnight and  will be discharged the following morning.   Of note, the patient did have mild hypokalemia on admission.  At discharge,  she reported having taken supplemental potassium in the past but has stopped  on Margaret own.  She does, however, enjoy eating bananas and was instructed to  eat these on a daily basis.  We will reassess Margaret hypokalemia at the time  of  scheduled visit with the metabolic profile.   DISCHARGE MEDICATIONS:  Lopressor 25 mg b.i.d., Cozaar 100 daily,  hydrochlorothiazide 25 daily, Norvasc 10 daily, Amaryl 4 b.i.d., Glucophage  500 b.i.d., Lantus 50 units daily, Synthroid 0.25 mg daily, Protonix 40 mg  b.i.d., Zetia 10 daily.   DISCHARGE INSTRUCTIONS:  No heavy lifting/driving x 2 days.  Call the office  if there is any swelling/bleeding of the groin.  Eat 1-2 bananas daily.  Resume Glucophage in 48 hours.  Follow up with Dr. [facility] on  February 9 at 3 p.m.  The patient will need a follow up metabolic profile  for monitoring of hypokalemia.   DISCHARGE DIAGNOSIS:   1.  Nonischemic chest pain.      1.  Normal serial cardiac markers.      2.  Normal coronary arteries except minimal irregularity of the proximal          left anterior descending  by cardiac catheterization January 04, 2005, with normal left ventricular function.  2.  Hypokalemia.  3.  Multiple cardiac risk factors      1.  Type 2 diabetes mellitus.      2.  Hypertension.      3.  Dyslipidemia.      4.  Remote tobacco.      5.  Family history.      6.  Age.  4.  Status post left nephrectomy.      1.  Secondary to renal carcinoma.  5.  Gastroesophageal reflux disease/hiatal hernia.  6.  Obesity.  7.  History of asthma.  8.  Hypothyroidism.      GS/MEDQ  D:  01/04/2005  T:  01/04/2005  Job:  [id number]   cc:   Wellington [place], M.D. [facility]   Christena Deem. Melvyn Novas, M.D. [facility]

## 2011-04-21 NOTE — Cardiovascular Report (Signed)
[name], [name]                 ACCOUNT NO.:  [id number]   MEDICAL RECORD NO.:  [id number]          PATIENT TYPE:  INP   LOCATION:  8473                         FACILITY:  [facility]   PHYSICIAN:  Eustace Quail, M.D. [facility] DATE OF BIRTH:  05/26/1935   DATE OF PROCEDURE:  01/04/2005  DATE OF DISCHARGE:                              CARDIAC CATHETERIZATION   CLINICAL HISTORY:  Margaret Dyer is 76 years old and is the wife of Margaret Dyer who is a patient of mine.  She has multiple risk factors, including  diabetes, hypertension, hyperlipidemia, and previous tobacco use and a  positive family history for coronary heart disease.  We evaluated her in  March for atypical chest pain with a Cardiolite which was negative.  She  recently developed recurrent chest pain and was admitted to the hospital,  had the diagnosis of unstable angina and was scheduled for catheterization  today.  Her markers were negative.   PROCEDURE:  Left heart catheterization was performed percutaneously via the  right femoral artery using an arterial sheath and 6-French preformed  coronary catheters.  A femoral arterial puncture was performed and Omnipaque  contrast was used.  Distal aortogram was performed to rule out abdominal  aortic aneurysm.  She has just one kidney.  The second kidney was removed  for cancer.  The right femoral artery was closed with [employer] at the end  of the procedure.  She was well enough __________ laboratory ___________.   RESULTS:  The aortic pressure was 136/72 with a mean of 100, left  ventricular pressure was 133/19.   Left main coronary artery:  The left main coronary artery was free of  significant disease.   Left anterior descending artery:  The left anterior descending artery gave  rise to two diagonal branches and a small and large septal perforator and  then terminated before the apex.  This also was irregular but there was no  significant obstruction.   Left circumflex artery:   The left circumflex artery gave rise to an early  marginal branch and a posterolateral branch.  These vessels were free of  significant disease.   Right coronary artery:  The right coronary artery is a moderate sized vessel  that gave rise to a conus branch, a right ventricular branch, a posterior  descending branch, and two posterolateral branches.  These vessels were free  of significant disease.   The left ventriculogram was performed in the RAO projection and showed good  wall motion with no areas of hypokinesis.  The estimated ejection fraction  was 60%.   Distal aortogram was performed that showed one renal artery on the right  side.  There was no significant aortic obstruction.   CONCLUSION:  Normal coronary angiography, normal left ventricular wall  motion.   RECOMMENDATION:  Reassurance.      BB/MEDQ  D:  01/04/2005  T:  01/04/2005  Job:  [id number]   cc:   Wellington Hampshire, M.D. [facility]   Cardiopulmonary Lab

## 2011-05-02 ENCOUNTER — Other Ambulatory Visit: Payer: Self-pay | Admitting: Family Medicine

## 2011-05-05 ENCOUNTER — Ambulatory Visit: Payer: Medicare Other | Admitting: Family Medicine

## 2011-05-19 ENCOUNTER — Other Ambulatory Visit: Payer: Self-pay | Admitting: Family Medicine

## 2011-05-23 ENCOUNTER — Encounter: Payer: Self-pay | Admitting: Family Medicine

## 2011-05-31 ENCOUNTER — Other Ambulatory Visit: Payer: Self-pay | Admitting: Family Medicine

## 2011-06-15 ENCOUNTER — Other Ambulatory Visit: Payer: Self-pay | Admitting: Family Medicine

## 2011-06-15 NOTE — Telephone Encounter (Signed)
Last seen 02/17/11 , last filled 04-14-11 #60

## 2011-06-16 NOTE — Telephone Encounter (Signed)
Rx faxed

## 2011-07-18 ENCOUNTER — Other Ambulatory Visit: Payer: Self-pay | Admitting: Family Medicine

## 2011-07-18 DIAGNOSIS — I10 Essential (primary) hypertension: Secondary | ICD-10-CM

## 2011-07-18 MED ORDER — AMLODIPINE BESYLATE 10 MG PO TABS: 10.0000 mg | ORAL_TABLET | Freq: Every day | ORAL | Status: DC

## 2011-07-18 NOTE — Telephone Encounter (Signed)
Refill x1 

## 2011-07-18 NOTE — Telephone Encounter (Signed)
Last seen 02/17/11, last filled 06-15-11 #60

## 2011-07-19 ENCOUNTER — Other Ambulatory Visit: Payer: Self-pay | Admitting: Family Medicine

## 2011-07-19 MED ORDER — LORAZEPAM 0.5 MG PO TABS: 0.5000 mg | ORAL_TABLET | Freq: Three times a day (TID) | ORAL | Status: DC | PRN

## 2011-07-19 NOTE — Telephone Encounter (Signed)
Already sent this AM

## 2011-07-19 NOTE — Telephone Encounter (Signed)
Rx faxed

## 2011-07-20 ENCOUNTER — Telehealth: Payer: Self-pay | Admitting: Family Medicine

## 2011-07-20 NOTE — Telephone Encounter (Signed)
Pt aware forms mailed on yesterday.

## 2011-07-21 ENCOUNTER — Other Ambulatory Visit: Payer: Self-pay | Admitting: Family Medicine

## 2011-07-21 NOTE — Telephone Encounter (Signed)
Last filled 05/02/11 and last office visit 3/12 for yeast infection

## 2011-07-24 ENCOUNTER — Other Ambulatory Visit: Payer: Self-pay | Admitting: Podiatry

## 2011-07-24 NOTE — Telephone Encounter (Signed)
Ok to refill

## 2011-07-25 MED ORDER — TRAMADOL HCL 50 MG PO TABS: 50.0000 mg | ORAL_TABLET | Freq: Four times a day (QID) | ORAL | Status: DC | PRN

## 2011-07-27 ENCOUNTER — Telehealth: Payer: Self-pay | Admitting: Family Medicine

## 2011-07-27 NOTE — Telephone Encounter (Signed)
Discussed with patient. I called [employer] and the application was rcv'd 0/30/14 and approved # [id number], patient she be receiving med in days. Patient voiced understanding     KP

## 2011-07-27 NOTE — Telephone Encounter (Signed)
Patient called [employer] for norvasc they havent received forms but patient has form on file for another med  from a different doc -she was rold we could call  [phone] acct [id number] & they will use that form to send her med

## 2011-08-04 ENCOUNTER — Other Ambulatory Visit: Payer: Self-pay | Admitting: Family Medicine

## 2011-08-04 MED ORDER — LOSARTAN POTASSIUM 100 MG PO TABS: 100.0000 mg | ORAL_TABLET | Freq: Every day | ORAL | Status: DC

## 2011-08-04 NOTE — Telephone Encounter (Signed)
Done

## 2011-08-17 ENCOUNTER — Other Ambulatory Visit: Payer: Self-pay | Admitting: Family Medicine

## 2011-08-18 NOTE — Telephone Encounter (Signed)
Faxed.   KP 

## 2011-08-18 NOTE — Telephone Encounter (Signed)
Last seen 02/20/11 and filled 07/19/11 please advise     KP

## 2011-08-25 ENCOUNTER — Other Ambulatory Visit: Payer: Self-pay | Admitting: Family Medicine

## 2011-08-25 NOTE — Telephone Encounter (Signed)
Patient said tramodol isnt helping pain - she wants to know if rx for vycodin could be called in [facility] - [address]   --- her  cell [phone]

## 2011-08-28 NOTE — Telephone Encounter (Signed)
mssg left to call the office    KP

## 2011-08-28 NOTE — Telephone Encounter (Signed)
Patient changed her mind and stated she wanted to just get a refill on the Tramadol instead. Last seen 02/17/11 and filled 07/25/11. Please advise    KP

## 2011-08-28 NOTE — Telephone Encounter (Signed)
Ok to call in tramadol 50 mg  1 po q6h prn --- #60

## 2011-08-29 MED ORDER — TRAMADOL HCL 50 MG PO TABS: 50.0000 mg | ORAL_TABLET | Freq: Four times a day (QID) | ORAL | Status: DC | PRN

## 2011-08-29 NOTE — Telephone Encounter (Signed)
Patient aware Rx faxed to the pharmacy    KP

## 2011-09-21 ENCOUNTER — Other Ambulatory Visit: Payer: Self-pay | Admitting: Family Medicine

## 2011-09-21 NOTE — Telephone Encounter (Signed)
Last filled 08-17-11 #60, Last seen 02/20/11

## 2011-09-22 NOTE — Telephone Encounter (Signed)
Rx faxed

## 2011-09-29 ENCOUNTER — Ambulatory Visit (HOSPITAL_BASED_OUTPATIENT_CLINIC_OR_DEPARTMENT_OTHER)
Admission: RE | Admit: 2011-09-29 | Discharge: 2011-09-29 | Disposition: A | Discharge status: Home / Self Care | Payer: Medicare Other | Source: Ambulatory Visit | Attending: Family Medicine | Admitting: Family Medicine

## 2011-09-29 ENCOUNTER — Ambulatory Visit (INDEPENDENT_AMBULATORY_CARE_PROVIDER_SITE_OTHER): Payer: Medicare Other | Admitting: Family Medicine

## 2011-09-29 ENCOUNTER — Encounter: Payer: Self-pay | Admitting: Family Medicine

## 2011-09-29 VITALS — BP 146/88 | HR 56 | Temp 98.4°F | Wt 217.8 lb

## 2011-09-29 DIAGNOSIS — Z23 Encounter for immunization: Secondary | ICD-10-CM

## 2011-09-29 DIAGNOSIS — I10 Essential (primary) hypertension: Secondary | ICD-10-CM

## 2011-09-29 DIAGNOSIS — S20219A Contusion of unspecified front wall of thorax, initial encounter: Secondary | ICD-10-CM

## 2011-09-29 DIAGNOSIS — E785 Hyperlipidemia, unspecified: Secondary | ICD-10-CM

## 2011-09-29 DIAGNOSIS — R5381 Other malaise: Secondary | ICD-10-CM

## 2011-09-29 DIAGNOSIS — E119 Type 2 diabetes mellitus without complications: Secondary | ICD-10-CM

## 2011-09-29 DIAGNOSIS — E039 Hypothyroidism, unspecified: Secondary | ICD-10-CM

## 2011-09-29 DIAGNOSIS — R5383 Other fatigue: Secondary | ICD-10-CM

## 2011-09-29 DIAGNOSIS — R0789 Other chest pain: Secondary | ICD-10-CM

## 2011-09-29 DIAGNOSIS — R52 Pain, unspecified: Secondary | ICD-10-CM

## 2011-09-29 DIAGNOSIS — R071 Chest pain on breathing: Secondary | ICD-10-CM | POA: Insufficient documentation

## 2011-09-29 DIAGNOSIS — E538 Deficiency of other specified B group vitamins: Secondary | ICD-10-CM

## 2011-09-29 DIAGNOSIS — W19XXXA Unspecified fall, initial encounter: Secondary | ICD-10-CM

## 2011-09-29 MED ORDER — METOPROLOL TARTRATE 50 MG PO TABS: 50.0000 mg | ORAL_TABLET | Freq: Two times a day (BID) | ORAL | Status: DC

## 2011-09-29 MED ORDER — TRAMADOL HCL 50 MG PO TABS: 50.0000 mg | ORAL_TABLET | Freq: Four times a day (QID) | ORAL | Status: DC | PRN

## 2011-09-29 NOTE — Progress Notes (Signed)
  Subjective:    Patient ID: Margaret Dyer, female    DOB: August 28, 1935, 76 y.o.   MRN: [id number]  HPI Pt here c/o bruise L side of chest after falling on piece of wood outside over weekend.     Review of Systems    as above Objective:   Physical Exam  Constitutional: She appears well-developed and well-nourished.  Cardiovascular: Normal rate and regular rhythm.   Pulmonary/Chest: Effort normal and breath sounds normal. No respiratory distress. She has no wheezes. She has no rales.  Musculoskeletal: She exhibits no edema.       No crepitus L side chest  Skin:       + ecchymosisi --lg area Left side chest           Assessment & Plan:  Contusion--L chest--- check xray                             Warm compresses alt with ice

## 2011-09-29 NOTE — Patient Instructions (Signed)
Chest Contusion You have been checked for injuries to your chest. Your caregiver has not found injuries serious enough to require hospitalization. It is common to have bruises and sore muscles after an injury. These tend to feel worse the first 24 hours. You may gradually develop more stiffness and soreness over the next several hours to several days. This usually feels worse the first morning following your injury. After a few days, you will usually begin to improve. The amount of improvement depends on the amount of damage. Following the accident, if the pain in any area continues to increase or you develop new areas of pain, you should see your primary caregiver or return to the Emergency Department for re-evaluation. HOME CARE INSTRUCTIONS   Put ice on sore areas every 2 hours for 20 minutes while awake for the next 2 days.   Drink extra fluids. Do not drink alcohol.   Activity as tolerated. Lifting may make pain worse.   Only take over-the-counter or prescription medicines for pain, discomfort, or fever as directed by your caregiver. Do not use aspirin. This may increase bruising or increase bleeding.  SEEK IMMEDIATE MEDICAL CARE IF:   There is a worsening of any of the problems that brought you in for care.   Shortness of breath, dizziness or fainting develop.   You have chest pain, difficulty breathing, or develop pain going down the left arm or up into jaw.   You feel sick to your stomach (nausea), vomiting or sweats.   You have increasing belly (abdominal) discomfort.   There is blood in your urine, stool, or if you vomit blood.   There is pain in either shoulder in an area where a shoulder strap would be.   You have feelings of lightheadedness, or if you should have a fainting episode.   You have numbness, tingling, weakness, or problems with the use of your arms or legs.   Severe headaches not relieved with medications develop.   You have a change in bowel or bladder  control.   There is increasing pain in any areas of the body.  If you feel your symptoms are worsening, and you are not able to see your primary caregiver, return to the Emergency Department immediately. MAKE SURE YOU:   Understand these instructions.   Will watch your condition.   Will get help right away if you are not doing well or get worse.  Document Released: 08/15/2001 Document Revised: 08/02/2011 Document Reviewed: 07/08/2008 [facility] Patient Information 2012 [place].

## 2011-10-24 ENCOUNTER — Encounter: Payer: Self-pay | Admitting: Family Medicine

## 2011-10-30 ENCOUNTER — Encounter: Payer: Self-pay | Admitting: Family Medicine

## 2011-10-30 ENCOUNTER — Ambulatory Visit (INDEPENDENT_AMBULATORY_CARE_PROVIDER_SITE_OTHER): Payer: Medicare Other | Admitting: Family Medicine

## 2011-10-30 DIAGNOSIS — J329 Chronic sinusitis, unspecified: Secondary | ICD-10-CM

## 2011-10-30 DIAGNOSIS — I10 Essential (primary) hypertension: Secondary | ICD-10-CM

## 2011-10-30 DIAGNOSIS — F419 Anxiety disorder, unspecified: Secondary | ICD-10-CM

## 2011-10-30 DIAGNOSIS — R52 Pain, unspecified: Secondary | ICD-10-CM

## 2011-10-30 DIAGNOSIS — Z1239 Encounter for other screening for malignant neoplasm of breast: Secondary | ICD-10-CM

## 2011-10-30 DIAGNOSIS — J40 Bronchitis, not specified as acute or chronic: Secondary | ICD-10-CM

## 2011-10-30 DIAGNOSIS — R5383 Other fatigue: Secondary | ICD-10-CM

## 2011-10-30 DIAGNOSIS — R5381 Other malaise: Secondary | ICD-10-CM

## 2011-10-30 DIAGNOSIS — F411 Generalized anxiety disorder: Secondary | ICD-10-CM

## 2011-10-30 DIAGNOSIS — Z0000 Encounter for general adult medical examination without abnormal findings: Secondary | ICD-10-CM

## 2011-10-30 DIAGNOSIS — Z780 Asymptomatic menopausal state: Secondary | ICD-10-CM

## 2011-10-30 MED ORDER — AMOXICILLIN-POT CLAVULANATE 875-125 MG PO TABS: 1.0000 | ORAL_TABLET | Freq: Two times a day (BID) | ORAL | Status: DC

## 2011-10-30 MED ORDER — LORAZEPAM 1 MG PO TABS: 1.0000 mg | ORAL_TABLET | Freq: Two times a day (BID) | ORAL | Status: DC

## 2011-10-30 MED ORDER — AMBULATORY NON FORMULARY MEDICATION: Status: DC

## 2011-10-30 MED ORDER — AMOXICILLIN-POT CLAVULANATE 875-125 MG PO TABS: 1.0000 | ORAL_TABLET | Freq: Two times a day (BID) | ORAL | Status: AC

## 2011-10-30 MED ORDER — TRAMADOL HCL 50 MG PO TABS: 50.0000 mg | ORAL_TABLET | Freq: Four times a day (QID) | ORAL | Status: DC | PRN

## 2011-10-30 MED ORDER — ZOSTER VACCINE LIVE 19400 UNT/0.65ML ~~LOC~~ SOLR
0.6500 mL | Freq: Once | SUBCUTANEOUS | Status: AC
Start: 2011-10-30 — End: 2011-10-31

## 2011-10-30 MED ORDER — ALBUTEROL SULFATE (2.5 MG/3ML) 0.083% IN NEBU: 2.5000 mg | INHALATION_SOLUTION | Freq: Four times a day (QID) | RESPIRATORY_TRACT | Status: DC | PRN

## 2011-10-30 MED ORDER — ALBUTEROL SULFATE (5 MG/ML) 0.5% IN NEBU
2.5000 mg | INHALATION_SOLUTION | Freq: Once | RESPIRATORY_TRACT | Status: AC
  Administered 2011-10-30: 2.5 mg via RESPIRATORY_TRACT

## 2011-10-30 NOTE — Patient Instructions (Signed)
Preventative Care for Adults, Female A healthy lifestyle and preventative care can promote health and wellness. Preventative health guidelines for women include the following key practices:  A routine yearly physical is a good way to check with your caregiver about your health and preventative screening. It is a chance to share any concerns and updates on your health, and to receive a thorough exam.   Visit your dentist for a routine exam and preventative care every 6 months. Brush your teeth twice a day and floss once a day. Good oral hygiene prevents tooth decay and gum disease.   The frequency of eye exams is based on your age, health, family medical history, use of contact lenses, and other factors. Follow your caregiver's recommendations for frequency of eye exams.   Eat a healthy diet. Foods like vegetables, fruits, whole grains, low-fat dairy products, and lean protein foods contain the nutrients you need without too many calories. Decrease your intake of foods high in solid fats, added sugars, and salt. Eat the right amount of calories for you.Get information about a proper diet from your caregiver, if necessary.   Regular physical exercise is one of the most important things you can do for your health. Most adults should get at least 150 minutes of moderate-intensity exercise (any activity that increases your heart rate and causes you to sweat) each week. In addition, most adults need muscle-strengthening exercises on 2 or more days a week.   Maintain a healthy weight. The body mass index (BMI) is a screening tool to identify possible weight problems. It provides an estimate of body fat based on height and weight. Your caregiver can help determine your BMI, and can help you achieve or maintain a healthy weight.For adults 20 years and older:   A BMI below 18.5 is considered underweight.   A BMI of 18.5 to 24.9 is normal.   A BMI of 25 to 29.9 is considered overweight.   A BMI of 30 and  above is considered obese.   Maintain normal blood lipids and cholesterol levels by exercising and minimizing your intake of saturated fat. Eat a balanced diet with plenty of fruit and vegetables. Blood tests for lipids and cholesterol should begin at age 90 and be repeated every 5 years. If your lipid or cholesterol levels are high, you are over 50, or you are a high risk for heart disease, you may need your cholesterol levels checked more frequently.Ongoing high lipid and cholesterol levels should be treated with medicines if diet and exercise are not effective.   If you smoke, find out from your caregiver how to quit. If you do not use tobacco, do not start.   If you are pregnant, do not drink alcohol. If you are breastfeeding, be very cautious about drinking alcohol. If you are not pregnant and choose to drink alcohol, do not exceed 1 drink per day. One drink is considered to be 12 ounces (355 mL) of beer, 5 ounces (148 mL) of wine, or 1.5 ounces (44 mL) of liquor.   Avoid use of street drugs. Do not share needles with anyone. Ask for help if you need support or instructions about stopping the use of drugs.   High blood pressure causes heart disease and increases the risk of stroke. Your blood pressure should be checked at least every 1 to 2 years. Ongoing high blood pressure should be treated with medicines if weight loss and exercise are not effective.   If you are 55 to 74  years old, ask your caregiver if you should take aspirin to prevent strokes.   Diabetes screening involves taking a blood sample to check your fasting blood sugar level. This should be done once every 3 years, after age 37, if you are within normal weight and without risk factors for diabetes. Testing should be considered at a younger age or be carried out more frequently if you are overweight and have at least 1 risk factor for diabetes.   Breast cancer screening is essential preventative care for women. You should  practice "breast self-awareness." This means understanding the normal appearance and feel of your breasts and may include breast self-examination. Any changes detected, no matter how small, should be reported to a caregiver. Women in their 32s and 30s should have a clinical breast exam (CBE) by a caregiver as part of a regular health exam every 1 to 3 years. After age 61, women should have a CBE every year. Starting at age 56, women should consider having a mammogram (breast X-ray) every year. Women who have a family history of breast cancer should talk to their caregiver about genetic screening. Women at a high risk of breast cancer should talk to their caregiver about having an MRI and a mammogram every year.   The Pap test is a screening test for cervical cancer. A Pap test can show cell changes on the cervix that might become cervical cancer if left untreated. A Pap test is a procedure in which cells are obtained and examined from the lower end of the uterus (cervix).   Women should have a Pap test starting at age 74.   Between ages 3 and 60, Pap tests should be repeated every 2 years.   Beginning at age 57, you should have a Pap test every 3 years as long as the past 3 Pap tests have been normal.   Some women have medical problems that increase the chance of getting cervical cancer. Talk to your caregiver about these problems. It is especially important to talk to your caregiver if a new problem develops soon after your last Pap test. In these cases, your caregiver may recommend more frequent screening and Pap tests.   The above recommendations are the same for women who have or have not gotten the vaccine for human papillomavirus (HPV).   If you had a hysterectomy for a problem that was not cancer or a condition that could lead to cancer, then you no longer need Pap tests. Even if you no longer need a Pap test, a regular exam is a good idea to make sure no other problems are starting.   If you  are between ages 69 and 63, and you have had normal Pap tests going back 10 years, you no longer need Pap tests. Even if you no longer need a Pap test, a regular exam is a good idea to make sure no other problems are starting.   If you have had past treatment for cervical cancer or a condition that could lead to cancer, you need Pap tests and screening for cancer for at least 20 years after your treatment.   If Pap tests have been discontinued, risk factors (such as a new sexual partner) need to be reassessed to determine if screening should be resumed.   The HPV test is an additional test that may be used for cervical cancer screening. The HPV test looks for the virus that can cause the cell changes on the cervix. The cells collected  during the Pap test can be tested for HPV. The HPV test could be used to screen women aged 38 years and older, and should be used in women of any age who have unclear Pap test results. After the age of 62, women should have HPV testing at the same frequency as a Pap test.   Colorectal cancer can be detected and often prevented. Most routine colorectal cancer screening begins at the age of 75 and continues through age 7. However, your caregiver may recommend screening at an earlier age if you have risk factors for colon cancer. On a yearly basis, your caregiver may provide home test kits to check for hidden blood in the stool. Use of a small camera at the end of a tube, to directly examine the colon (sigmoidoscopy or colonoscopy), can detect the earliest forms of colorectal cancer. Talk to your caregiver about this at age 32, when routine screening begins. Direct examination of the colon should be repeated every 5 to 10 years through age 16, unless early forms of pre-cancerous polyps or small growths are found.   Practice safe sex. Use condoms and avoid high-risk sexual practices to reduce the spread of sexually transmitted infections (STIs). STIs include gonorrhea,  chlamydia, syphilis, trichomonas, herpes, HPV, and human immunodeficiency virus (HIV). Herpes, HIV, and HPV are viral illnesses that have no cure. They can result in disability, cancer, and death. Sexually active women aged 33 and younger should be checked for Chlamydia. Older women with new or multiple partners should also be tested for Chlamydia. Testing for other STIs is recommended if you are sexually active and at increased risk.   Osteoporosis is a disease in which the bones lose minerals and strength with aging. This can result in serious bone fractures. The risk of osteoporosis can be identified using a bone density scan. Women ages 55 and over and women at risk for fractures or osteoporosis should discuss screening with their caregivers. Ask your caregiver whether you should take a calcium supplement or vitamin D to reduce the rate of osteoporosis.   Menopause can be associated with physical symptoms and risks. Hormone replacement therapy is available to decrease symptoms and risks. You should talk to your caregiver about whether hormone replacement therapy is right for you.   Use sunscreen with skin protection factor (SPF) of 30 or more. Apply sunscreen liberally and repeatedly throughout the day. You should seek shade when your shadow is shorter than you. Protect yourself by wearing long sleeves, pants, a wide-brimmed hat, and sunglasses year round, whenever you are outdoors.   Once a month, do a whole body skin exam, using a mirror to look at the skin on your back. Notify your caregiver of new moles, moles that have irregular borders, moles that are larger than a pencil eraser, or moles that have changed in shape or color.   Stay current with required immunizations.   Influenza. You need a dose every fall (or winter). The composition of the flu vaccine changes each year, so being vaccinated once is not enough.   Pneumococcal polysaccharide. You need 1 to 2 doses if you smoke cigarettes or  if you have certain chronic medical conditions. You need 1 dose at age 64 (or older) if you have never been vaccinated.   Tetanus, diphtheria, pertussis (Tdap, Td). Get 1 dose of Tdap vaccine if you are younger than age 44 years, are over 91 and have contact with an infant, are a Dietitian, are pregnant, or simply want  to be protected from whooping cough. After that, you need a Td booster dose every 10 years. Consult your caregiver if you have not had at least 3 tetanus and diphtheria-containing shots sometime in your life or have a deep or dirty wound.   HPV. You need this vaccine if you are a woman age 58 years or younger. The vaccine is given in 3 doses over 6 months.   Measles, mumps, rubella (MMR). You need at least 1 dose of MMR if you were born in 1957 or later. You may also need a 2nd dose.   Meningococcal. If you are age 18 to 41 years and a Market researcher living in a residence hall, or have one of several medical conditions, you need to get vaccinated against meningococcal disease. You may also need additional booster doses.   Zoster (shingles). If you are age 35 years or older, you should get this vaccine.   Varicella (chickenpox). If you have never had chickenpox or you were vaccinated but received only 1 dose, talk to your caregiver to find out if you need this vaccine.   Hepatitis A. You need this vaccine if you have a specific risk factor for hepatitis A virus infection or you simply wish to be protected from this disease. The vaccine is usually given as 2 doses, 6 to 18 months apart.   Hepatitis B. You need this vaccine if you have a specific risk factor for hepatitis B virus infection or you simply wish to be protected from this disease. The vaccine is given in 3 doses, usually over 6 months.  Preventative Services / Frequency Ages 18 to 72  Blood pressure check.** / Every 1 to 2 years.   Lipid and cholesterol check.**/ Every 5 years beginning at age 73.    Clinical breast exam.** / Every 3 years for women in their 50s and 19s.   Pap Test.** / Every 2 years from ages 18 through 30. Every 3 years starting at age 18 years through age 76 or 67 with a history of 3 consecutive normal Pap tests.   HPV Screening.** / Every 3 years from ages 80 through ages 57 to 59 with a history of 3 consecutive normal Pap tests.   Skin self-exam. / Monthly.   Influenza immunization.** / Every year.   Pneumococcal polysaccharide immunization.** / 1 to 2 doses if you smoke cigarettes or if you have certain chronic medical conditions.   Tetanus, diphtheria, pertussis (Tdap,Td) immunization. / A one-time dose of Tdap vaccine. After that, you need a Td booster dose every 10 years.   HPV immunization. / 3 doses over 6 months, if 48 and younger.   Measles, mumps, rubella (MMR) immunization. / You need at least 1 dose of MMR if you were born in 1957 or later. You may also need a 2nd dose.   Meningococcal immunization. / 1 dose if you are age 43 to 76 years and a Market researcher living in a residence hall, or have one of several medical conditions, you need to get vaccinated against meningococcal disease. You may also need additional booster doses.   Varicella immunization. **/ Consult your caregiver.   Hepatitis A immunization. ** / Consult your caregiver. 2 doses, 6 to 18 months apart.   Hepatitis B immunization.** / Consult your caregiver. 3 doses usually over 6 months.  Ages 57 to 59  Blood pressure check.** / Every 1 to 2 years.   Lipid and cholesterol check.**/ Every 5 years beginning  at age 4.   Clinical breast exam.** / Every year after age 30.   Mammogram.** / Every year beginning at age 79 and continuing for as long as you are in good health. Consult with your caregiver.   Pap Test.** / Every 3 years starting at age 13 years through age 45 or 10 with a history of 3 consecutive normal Pap tests.   HPV Screening.** / Every 3 years from  ages 33 through ages 21 to 4 with a history of 3 consecutive normal Pap tests.   Fecal occult blood test (FOBT) of stool. / Every year beginning at age 57 and continuing until age 35. You may not have to do this test if you get colonoscopy every 10 years.   Flexible sigmoidoscopy** or colonoscopy.** / Every 5 years for a flexible sigmoidoscopy or every 10 years for a colonoscopy beginning at age 74 and continuing until age 19.   Skin self-exam. / Monthly.   Influenza immunization.** / Every year.   Pneumococcal polysaccharide immunization.** / 1 to 2 doses if you smoke cigarettes or if you have certain chronic medical conditions.   Tetanus, diphtheria, pertussis (Tdap/Td) immunization.** / A one-time dose of Tdap vaccine. After that, you need a Td booster dose every 10 years.   Measles, mumps, rubella (MMR) immunization. / You need at least 1 dose of MMR if you were born in 1957 or later. You may also need a 2nd dose.   Varicella immunization. **/ Consult your caregiver.   Meningococcal immunization.** / Consult your caregiver.     Hepatitis A immunization. ** / Consult your caregiver. 2 doses, 6 to 18 months apart.   Hepatitis B immunization.** / Consult your caregiver. 3 doses, usually over 6 months.  Ages 22 and over  Blood pressure check.** / Every 1 to 2 years.   Lipid and cholesterol check.**/ Every 5 years beginning at age 71.   Clinical breast exam.** / Every year after age 46.   Mammogram.** / Every year beginning at age 68 and continuing for as long as you are in good health. Consult with your caregiver.   Pap Test,** / Every 3 years starting at age 62 years through age 85 or 66 with a 3 consecutive normal Pap tests. Testing can be stopped between 65 and 70 with 3 consecutive normal Pap tests and no abnormal Pap or HPV tests in the past 10 years.   HPV Screening.** / Every 3 years from ages 69 through ages 55 or 37 with a history of 3 consecutive normal Pap tests.  Testing can be stopped between 65 and 70 with 3 consecutive normal Pap tests and no abnormal Pap or HPV tests in the past 10 years.   Fecal occult blood test (FOBT) of stool. / Every year beginning at age 56 and continuing until age 71. You may not have to do this test if you get colonoscopy every 10 years.   Flexible sigmoidoscopy** or colonoscopy.** / Every 5 years for a flexible sigmoidoscopy or every 10 years for a colonoscopy beginning at age 31 and continuing until age 53.   Osteoporosis screening.** / A one-time screening for women ages 66 and over and women at risk for fractures or osteoporosis.   Skin self-exam. / Monthly.   Influenza immunization.** / Every year.   Pneumococcal polysaccharide immunization.** / 1 dose at age 29 (or older) if you have never been vaccinated.   Tetanus, diphtheria, pertussis (Tdap, Td) immunization. / A one-time dose of Tdap  vaccine if you are over 65 and have contact with an infant, are a Dietitian, or simply want to be protected from whooping cough. After that, you need a Td booster dose every 10 years.   Varicella immunization. **/ Consult your caregiver.   Meningococcal immunization.** / Consult your caregiver.   Hepatitis A immunization. ** / Consult your caregiver. 2 doses, 6 to 18 months apart.   Hepatitis B immunization.** / Check with your caregiver. 3 doses, usually over 6 months.  ** Family history and personal history of risk and conditions may change your caregiver's recommendations. Document Released: 01/16/2002 Document Revised: 08/02/2011 Document Reviewed: 04/17/2011 [facility] Patient Information 2012 [place].

## 2011-10-30 NOTE — Progress Notes (Signed)
Subjective:     Margaret Dyer is a 76 y.o. female and is here for a comprehensive physical exam. The patient reports problems - sinus congestion/ coughing for 3 weeks.  History   Social History  . Marital Status: Married    Spouse Name: N/A    Number of Children: N/A  . Years of Education: N/A   Occupational History  . Not on file.   Social History Main Topics  . Smoking status: Never Smoker   . Smokeless tobacco: Not on file  . Alcohol Use: No  . Drug Use: No  . Sexually Active:    Other Topics Concern  . Not on file   Social History Narrative  . No narrative on file   Health Maintenance  Topic Date Due  . Tetanus/tdap  05/18/1956  . Colonoscopy  05/19/1987  . Zostavax  05/18/1997  . Pneumococcal Polysaccharide Vaccine Age 36 And Over  05/18/2002  . Influenza Vaccine  09/03/2012    The following portions of the patient's history were reviewed and updated as appropriate: allergies, current medications, past family history, past medical history, past social history, past surgical history and problem list.  Review of Systems Review of Systems  Constitutional: Negative for activity change, appetite change and fatigue.  HENT: Negative for hearing loss, congestion, tinnitus and ear discharge.  dentist---due Eyes: Negative for visual disturbance (see optho q1y -- + cataracts).Marland Kitchen  Respiratory: positive for cough, chest tightness and shortness of breath.   Cardiovascular: Negative for chest pain, palpitations and leg swelling.  Gastrointestinal: Negative for abdominal pain, diarrhea, constipation and abdominal distention.  Genitourinary: Negative for urgency, frequency, decreased urine volume and difficulty urinating.  Musculoskeletal: Negative for back pain, arthralgias and gait problem.  Skin: Negative for color change, pallor and rash.  Neurological: Negative for dizziness, light-headedness, numbness and headaches.  Hematological: Negative for adenopathy. Does not  bruise/bleed easily.  Psychiatric/Behavioral: Negative for suicidal ideas, confusion, sleep disturbance, self-injury, dysphoric mood, decreased concentration and agitation.       Objective:    BP 132/80  Pulse 84  Temp(Src) 98.1 F (36.7 C) (Oral)  Ht 5' 3.5" (1.613 m)  Wt 215 lb (97.523 kg)  BMI 37.49 kg/m2  SpO2 99% General appearance: alert, cooperative, appears stated age and mild distress Head: Normocephalic, without obvious abnormality, atraumatic Eyes: conjunctivae/corneas clear. PERRL, EOM's intact. Fundi benign. Ears: normal TM's and external ear canals both ears Nose: green discharge, moderate congestion, sinus tenderness bilateral Throat: lips, mucosa, and tongue normal; teeth and gums normal Neck: mild anterior cervical adenopathy, supple, symmetrical, trachea midline and thyroid not enlarged, symmetric, no tenderness/mass/nodules Back: symmetric, no curvature. ROM normal. No CVA tenderness. Lungs: clear to auscultation bilaterally Breasts: normal appearance, no masses or tenderness Heart: regular rate and rhythm, S1, S2 normal, no murmur, click, rub or gallop Abdomen: soft, non-tender; bowel sounds normal; no masses,  no organomegaly Pelvic: not indicated; post-menopausal, no abnormal Pap smears in past Extremities: extremities normal, atraumatic, no cyanosis or edema Pulses: 2+ and symmetric Skin: Skin color, texture, turgor normal. No rashes or lesions Lymph nodes: Cervical, supraclavicular, and axillary nodes normal. Neurologic: Alert and oriented X 3, normal strength and tone. Normal symmetric reflexes. Normal coordination and gait psych--no depression, anxiety   Sensory exam of the foot is normal, tested with the monofilament. Good pulses, no lesions or ulcers, good peripheral pulses. Assessment:    Healthy female exam.  sinusitis  -- augmentin dmII-- check labs  Htn=--- stable,  Check labs Hyperlipidemia--- con't  meds, check labs   Plan:  Check fasting  labs ghm utd    See After Visit Summary for Counseling Recommendations

## 2011-11-01 ENCOUNTER — Other Ambulatory Visit (INDEPENDENT_AMBULATORY_CARE_PROVIDER_SITE_OTHER): Payer: Medicare Other

## 2011-11-01 ENCOUNTER — Telehealth: Payer: Self-pay | Admitting: Family Medicine

## 2011-11-01 DIAGNOSIS — I10 Essential (primary) hypertension: Secondary | ICD-10-CM

## 2011-11-01 DIAGNOSIS — E039 Hypothyroidism, unspecified: Secondary | ICD-10-CM

## 2011-11-01 DIAGNOSIS — R5383 Other fatigue: Secondary | ICD-10-CM

## 2011-11-01 DIAGNOSIS — R319 Hematuria, unspecified: Secondary | ICD-10-CM

## 2011-11-01 DIAGNOSIS — E119 Type 2 diabetes mellitus without complications: Secondary | ICD-10-CM

## 2011-11-01 DIAGNOSIS — E538 Deficiency of other specified B group vitamins: Secondary | ICD-10-CM

## 2011-11-01 DIAGNOSIS — R5381 Other malaise: Secondary | ICD-10-CM

## 2011-11-01 DIAGNOSIS — E785 Hyperlipidemia, unspecified: Secondary | ICD-10-CM

## 2011-11-01 LAB — BASIC METABOLIC PANEL
BUN: 21 mg/dL (ref 6–23)
Creatinine, Ser: 0.8 mg/dL (ref 0.4–1.2)
GFR: 71.34 mL/min (ref >60.00)

## 2011-11-01 LAB — CBC WITH DIFFERENTIAL/PLATELET
Eosinophils Relative: 1.8 % (ref 0.0–5.0)
HCT: 45.5 % (ref 36.0–46.0)
Lymphs Abs: 1.7 10*3/uL (ref 0.7–4.0)
MCHC: 33.9 g/dL (ref 30.0–36.0)
MCV: 89.6 fl (ref 78.0–100.0)
Monocytes Absolute: 0.6 10*3/uL (ref 0.1–1.0)
Platelets: 164 10*3/uL (ref 150.0–400.0)
RDW: 13.6 % (ref 11.5–14.6)

## 2011-11-01 LAB — POCT URINALYSIS DIPSTICK
Bilirubin, UA: NEGATIVE
Ketones, UA: NEGATIVE
Leukocytes, UA: NEGATIVE
pH, UA: 6

## 2011-11-01 LAB — MICROALBUMIN / CREATININE URINE RATIO: Microalb, Ur: 74.9 mg/dL — ABNORMAL HIGH (ref 0.0–1.9)

## 2011-11-01 LAB — LIPID PANEL: Total CHOL/HDL Ratio: 4

## 2011-11-01 LAB — HEMOGLOBIN A1C: Hgb A1c MFr Bld: 9.5 % — ABNORMAL HIGH (ref 4.6–6.5)

## 2011-11-01 LAB — HEPATIC FUNCTION PANEL
AST: 37 U/L (ref 0–37)
Alkaline Phosphatase: 102 U/L (ref 39–117)
Bilirubin, Direct: 0.1 mg/dL (ref 0.0–0.3)
Total Bilirubin: 0.9 mg/dL (ref 0.3–1.2)

## 2011-11-01 MED ORDER — FLUCONAZOLE 150 MG PO TABS: ORAL_TABLET | ORAL | Status: DC

## 2011-11-01 NOTE — Telephone Encounter (Signed)
Per Dr.Tabori patient can have Diflucan #150 1 po today then repeat in 3 days if symptoms persist. --Rx faxed and patient VM left to make patient aware     KP

## 2011-11-01 NOTE — Progress Notes (Signed)
12  

## 2011-11-03 LAB — URINE CULTURE: Colony Count: NO GROWTH

## 2011-11-09 ENCOUNTER — Telehealth: Payer: Self-pay | Admitting: Family Medicine

## 2011-11-09 NOTE — Telephone Encounter (Signed)
See labs 

## 2011-11-09 NOTE — Telephone Encounter (Signed)
msg left to call the office     KP 

## 2011-11-09 NOTE — Telephone Encounter (Signed)
error

## 2011-11-09 NOTE — Telephone Encounter (Signed)
Chol good Dm not controlled---we need to refer to endo Recheck chol in 6 months---272.4 Lipid, hep   Patient aware---copy of labs left at check in for endo apt tomorrow    KP

## 2011-11-09 NOTE — Telephone Encounter (Signed)
Patient called and requested a her lab results... Please advise    KP

## 2011-11-13 ENCOUNTER — Other Ambulatory Visit: Payer: Self-pay | Admitting: Family Medicine

## 2011-11-13 DIAGNOSIS — E785 Hyperlipidemia, unspecified: Secondary | ICD-10-CM

## 2011-11-13 MED ORDER — EZETIMIBE-SIMVASTATIN 10-20 MG PO TABS: 1.0000 | ORAL_TABLET | Freq: Every day | ORAL | Status: DC

## 2011-11-14 ENCOUNTER — Telehealth: Payer: Self-pay | Admitting: Family Medicine

## 2011-11-14 NOTE — Telephone Encounter (Signed)
Discussed with patient and advised her the Rx to the mail order has been mailed today---I made a copy for our records    KP

## 2011-11-14 NOTE — Telephone Encounter (Signed)
Patient left a form on Friday [id number]  To be filled out for  vytorin 10/20 & form was to be mailed to [facility] for her meds but rx was called in to pharmacy

## 2011-11-23 ENCOUNTER — Other Ambulatory Visit: Payer: Self-pay | Admitting: Family Medicine

## 2011-11-23 NOTE — Telephone Encounter (Signed)
[place] pt please advise

## 2011-12-25 ENCOUNTER — Other Ambulatory Visit: Payer: Self-pay | Admitting: Family Medicine

## 2011-12-25 ENCOUNTER — Telehealth: Payer: Self-pay | Admitting: Family Medicine

## 2011-12-25 NOTE — Telephone Encounter (Signed)
The pt called and stated she [facility] [address]  Thanks!

## 2011-12-25 NOTE — Telephone Encounter (Signed)
Rx sent      KP 

## 2011-12-25 NOTE — Telephone Encounter (Signed)
Last seen and filled 10/30/11 . Please advise    KP

## 2011-12-27 ENCOUNTER — Other Ambulatory Visit: Payer: Self-pay | Admitting: Family Medicine

## 2011-12-28 DIAGNOSIS — Z1231 Encounter for screening mammogram for malignant neoplasm of breast: Secondary | ICD-10-CM | POA: Diagnosis not present

## 2011-12-28 DIAGNOSIS — Z13820 Encounter for screening for osteoporosis: Secondary | ICD-10-CM | POA: Diagnosis not present

## 2012-01-12 ENCOUNTER — Encounter: Payer: Self-pay | Admitting: Family Medicine

## 2012-01-15 ENCOUNTER — Ambulatory Visit (INDEPENDENT_AMBULATORY_CARE_PROVIDER_SITE_OTHER): Payer: Medicare Other | Admitting: Internal Medicine

## 2012-01-15 ENCOUNTER — Encounter: Payer: Self-pay | Admitting: Family Medicine

## 2012-01-15 VITALS — BP 140/82 | HR 76 | Temp 97.8°F | Wt 213.0 lb

## 2012-01-15 DIAGNOSIS — J029 Acute pharyngitis, unspecified: Secondary | ICD-10-CM | POA: Diagnosis not present

## 2012-01-15 DIAGNOSIS — J069 Acute upper respiratory infection, unspecified: Secondary | ICD-10-CM | POA: Diagnosis not present

## 2012-01-15 LAB — POCT RAPID STREP A (OFFICE): Rapid Strep A Screen: NEGATIVE

## 2012-01-15 MED ORDER — TRAMADOL HCL 50 MG PO TABS: 50.0000 mg | ORAL_TABLET | Freq: Three times a day (TID) | ORAL | Status: DC | PRN

## 2012-01-15 NOTE — Progress Notes (Signed)
  Subjective:    Patient ID: Margaret Dyer, female    DOB: [birth year], 77 y.o.   MRN: [id number]  HPI Acute visit 3 days ago developed moderate to severe sore throat, the roof of her mouth was hurting as well. She also developed B ear congestion and pain. She is coughing mildly.   Past Medical History  Diagnosis Date  . Asthma   . Diabetes mellitus   . Osteopenia   . Renal cell carcinoma   . Goiter   . Hypertension   . Hyperlipidemia      Review of Systems Denies fever chills No nausea, vomiting, diarrhea. She has chronic postnasal dripping from allergies, maybe slightly worse lately. No sputum per se      Objective:   Physical Exam  Constitutional: She appears well-developed. No distress.  HENT:  Head: Normocephalic and atraumatic.       Face symmetric, nontender to palpation. Tympanic membranes bilaterally slightly bulging but not read. Nose slightly congested. Throat symmetric, uvula midline, no discharge, she does have two 1 mm red dots in the soft palate. No drooling. Mild  hoarseness.  Cardiovascular: Normal rate, regular rhythm and normal heart sounds.   No murmur heard. Pulmonary/Chest: Effort normal and breath sounds normal. No respiratory distress. She has no wheezes. She has no rales.  Skin: She is not diaphoretic.      Assessment & Plan:  URI Strep a neg Likely viral pharyngitis. See instructions. Request a RF for ultram, got 60 last month but reports she has been taking it regularly; a Rx for 60 provided

## 2012-01-15 NOTE — Patient Instructions (Signed)
Rest, fluids , tylenol For cough, take Mucinex DM twice a day as needed   Call if no better in few days Call anytime if the symptoms are severe, you have high fever, short of breath, persistent chest pain

## 2012-01-16 ENCOUNTER — Encounter: Payer: Self-pay | Admitting: Internal Medicine

## 2012-01-17 ENCOUNTER — Encounter: Payer: Self-pay | Admitting: Family Medicine

## 2012-01-17 ENCOUNTER — Other Ambulatory Visit: Payer: Self-pay | Admitting: Family Medicine

## 2012-01-17 ENCOUNTER — Ambulatory Visit (INDEPENDENT_AMBULATORY_CARE_PROVIDER_SITE_OTHER): Payer: Medicare Other | Admitting: Family Medicine

## 2012-01-17 VITALS — BP 132/78 | HR 77 | Temp 98.6°F | Wt 214.4 lb

## 2012-01-17 DIAGNOSIS — J029 Acute pharyngitis, unspecified: Secondary | ICD-10-CM

## 2012-01-17 LAB — POCT RAPID STREP A (OFFICE): Rapid Strep A Screen: NEGATIVE

## 2012-01-17 MED ORDER — AMOXICILLIN 875 MG PO TABS: 875.0000 mg | ORAL_TABLET | Freq: Two times a day (BID) | ORAL | Status: AC

## 2012-01-17 MED ORDER — FIRST-DUKES MOUTHWASH MT SUSP: OROMUCOSAL | Status: DC

## 2012-01-17 NOTE — Patient Instructions (Signed)
Pharyngitis, Viral and Bacterial Pharyngitis is soreness (inflammation) or infection of the pharynx. It is also called a sore throat. CAUSES  Most sore throats are caused by viruses and are part of a cold. However, some sore throats are caused by strep and other bacteria. Sore throats can also be caused by post nasal drip from draining sinuses, allergies and sometimes from sleeping with an open mouth. Infectious sore throats can be spread from person to person by coughing, sneezing and sharing cups or eating utensils. TREATMENT  Sore throats that are viral usually last 3-4 days. Viral illness will get better without medications (antibiotics). Strep throat and other bacterial infections will usually begin to get better about 24-48 hours after you begin to take antibiotics. HOME CARE INSTRUCTIONS   If the caregiver feels there is a bacterial infection or if there is a positive strep test, they will prescribe an antibiotic. The full course of antibiotics must be taken. If the full course of antibiotic is not taken, you or your child may become ill again. If you or your child has strep throat and do not finish all of the medication, serious heart or kidney diseases may develop.   Drink enough water and fluids to keep your urine clear or pale yellow.   Only take over-the-counter or prescription medicines for pain, discomfort or fever as directed by your caregiver.   Get lots of rest.   Gargle with salt water ( tsp. of salt in a glass of water) as often as every 1-2 hours as you need for comfort.   Hard candies may soothe the throat if individual is not at risk for choking. Throat sprays or lozenges may also be used.  SEEK MEDICAL CARE IF:   Large, tender lumps in the neck develop.   A rash develops.   Green, yellow-brown or bloody sputum is coughed up.   Your baby is older than 3 months with a rectal temperature of 100.5 F (38.1 C) or higher for more than 1 day.  SEEK IMMEDIATE MEDICAL CARE  IF:   A stiff neck develops.   You or your child are drooling or unable to swallow liquids.   You or your child are vomiting, unable to keep medications or liquids down.   You or your child has severe pain, unrelieved with recommended medications.   You or your child are having difficulty breathing (not due to stuffy nose).   You or your child are unable to fully open your mouth.   You or your child develop redness, swelling, or severe pain anywhere on the neck.   You have a fever.   Your baby is older than 3 months with a rectal temperature of 102 F (38.9 C) or higher.   Your baby is 39 months old or younger with a rectal temperature of 100.4 F (38 C) or higher.  MAKE SURE YOU:   Understand these instructions.   Will watch your condition.   Will get help right away if you are not doing well or get worse.  Document Released: 11/20/2005 Document Revised: 08/02/2011 Document Reviewed: 02/17/2008 [facility] Patient Information 2012 [place].

## 2012-01-17 NOTE — Progress Notes (Signed)
  Subjective:     Margaret Dyer is a 77 y.o. female who presents for evaluation of sore throat. Associated symptoms include post nasal drip and sore throat. Onset of symptoms was several days ago, and have been gradually worsening since that time. She is drinking plenty of fluids. She has not had a recent close exposure to someone with proven streptococcal pharyngitis.  The following portions of the patient's history were reviewed and updated as appropriate: allergies, current medications, past family history, past medical history, past social history, past surgical history and problem list.  Review of Systems Pertinent items are noted in HPI.    Objective:    BP 132/78  Pulse 77  Temp(Src) 98.6 F (37 C) (Oral)  Wt 214 lb 6.4 oz (97.251 kg)  SpO2 97% General appearance: alert, cooperative, appears stated age and mild distress Ears: normal TM's and external ear canals both ears Nose: Nares normal. Septum midline. Mucosa normal. No drainage or sinus tenderness. Throat: abnormal findings: mild oropharyngeal erythema and no exudates Neck: no adenopathy, no carotid bruit, no JVD, supple, symmetrical, trachea midline and thyroid not enlarged, symmetric, no tenderness/mass/nodules Lungs: clear to auscultation bilaterally Heart: S1, S2 normal  Laboratory Strep test done. Results:negative.    Assessment:    Acute pharyngitis, likely  pharyngitis.    Plan:    Patient placed on antibiotics. Use of OTC analgesics recommended as well as salt water gargles. Follow up as needed.   Culture done

## 2012-01-19 ENCOUNTER — Emergency Department (HOSPITAL_BASED_OUTPATIENT_CLINIC_OR_DEPARTMENT_OTHER)
Admission: EM | Admit: 2012-01-19 | Discharge: 2012-01-19 | Disposition: A | Discharge status: Home / Self Care | Payer: Medicare Other | Attending: Emergency Medicine | Admitting: Emergency Medicine

## 2012-01-19 ENCOUNTER — Encounter (HOSPITAL_BASED_OUTPATIENT_CLINIC_OR_DEPARTMENT_OTHER): Payer: Self-pay | Admitting: *Deleted

## 2012-01-19 ENCOUNTER — Emergency Department (INDEPENDENT_AMBULATORY_CARE_PROVIDER_SITE_OTHER): Payer: Medicare Other

## 2012-01-19 DIAGNOSIS — J45909 Unspecified asthma, uncomplicated: Secondary | ICD-10-CM | POA: Insufficient documentation

## 2012-01-19 DIAGNOSIS — E785 Hyperlipidemia, unspecified: Secondary | ICD-10-CM | POA: Diagnosis not present

## 2012-01-19 DIAGNOSIS — E119 Type 2 diabetes mellitus without complications: Secondary | ICD-10-CM | POA: Insufficient documentation

## 2012-01-19 DIAGNOSIS — M79609 Pain in unspecified limb: Secondary | ICD-10-CM

## 2012-01-19 DIAGNOSIS — I10 Essential (primary) hypertension: Secondary | ICD-10-CM | POA: Insufficient documentation

## 2012-01-19 DIAGNOSIS — Z79899 Other long term (current) drug therapy: Secondary | ICD-10-CM | POA: Insufficient documentation

## 2012-01-19 DIAGNOSIS — IMO0002 Reserved for concepts with insufficient information to code with codable children: Secondary | ICD-10-CM | POA: Diagnosis not present

## 2012-01-19 DIAGNOSIS — L03019 Cellulitis of unspecified finger: Secondary | ICD-10-CM | POA: Diagnosis not present

## 2012-01-19 MED ORDER — LIDOCAINE HCL 2 % IJ SOLN
15.0000 mL | Freq: Once | INTRAMUSCULAR | Status: AC
  Administered 2012-01-19: 300 mg
  Filled 2012-01-19: qty 1

## 2012-01-19 MED ORDER — SULFAMETHOXAZOLE-TRIMETHOPRIM 800-160 MG PO TABS: 1.0000 | ORAL_TABLET | Freq: Two times a day (BID) | ORAL | Status: AC

## 2012-01-19 MED ORDER — FLUCONAZOLE 200 MG PO TABS: 200.0000 mg | ORAL_TABLET | Freq: Every day | ORAL | Status: AC

## 2012-01-19 MED ORDER — HYDROCODONE-ACETAMINOPHEN 5-325 MG PO TABS: 2.0000 | ORAL_TABLET | ORAL | Status: AC | PRN

## 2012-01-19 NOTE — Discharge Instructions (Signed)
Paronychia Paronychia is an inflammatory reaction involving the folds of the skin surrounding the fingernail. This is commonly caused by an infection in the skin around a nail. The most common cause of paronychia is frequent wetting of the hands (as seen with bartenders, food servers, nurses or others who wet their hands). This makes the skin around the fingernail susceptible to infection by bacteria (germs) or fungus. Other predisposing factors are:  Aggressive manicuring.   Nail biting.   Thumb sucking.  The most common cause is a staphylococcal (a type of germ) infection, or a fungal (Candida) infection. When caused by a germ, it usually comes on suddenly with redness, swelling, pus and is often painful. It may get under the nail and form an abscess (collection of pus), or form an abscess around the nail. If the nail itself is infected with a fungus, the treatment is usually prolonged and may require oral medicine for up to one year. Your caregiver will determine the length of time treatment is required. The paronychia caused by bacteria (germs) may largely be avoided by not pulling on hangnails or picking at cuticles. When the infection occurs at the tips of the finger it is called felon. When the cause of paronychia is from the herpes simplex virus (HSV) it is called herpetic whitlow. TREATMENT  When an abscess is present treatment is often incision and drainage. This means that the abscess must be cut open so the pus can get out. When this is done, the following home care instructions should be followed. HOME CARE INSTRUCTIONS   It is important to keep the affected fingers very dry. Rubber or plastic gloves over cotton gloves should be used whenever the hand must be placed in water.   Keep wound clean, dry and dressed as suggested by your caregiver between warm soaks or warm compresses.   Soak in warm water for fifteen to twenty minutes three to four times per day for bacterial infections.  Fungal infections are very difficult to treat, so often require treatment for long periods of time.   For bacterial (germ) infections take antibiotics (medicine which kill germs) as directed and finish the prescription, even if the problem appears to be solved before the medicine is gone.   Only take over-the-counter or prescription medicines for pain, discomfort, or fever as directed by your caregiver.  SEEK IMMEDIATE MEDICAL CARE IF:  You have redness, swelling, or increasing pain in the wound.   You notice pus coming from the wound.   You have a fever.   You notice a bad smell coming from the wound or dressing.  Document Released: 05/16/2001 Document Revised: 08/02/2011 Document Reviewed: 01/15/2009 [facility] Patient Information 2012 [place].

## 2012-01-19 NOTE — ED Provider Notes (Signed)
History     CSN: [id number]  Arrival date & time 01/19/12  1818   First MD Initiated Contact with Patient 01/19/12 1935      Chief Complaint  Patient presents with  . Wound Infection    (Consider location/radiation/quality/duration/timing/severity/associated sxs/prior treatment) HPI Comments: History of diabetes presenting with pain to her left middle finger for the past 2 days. She was seen by her doctor 2 days ago and treated for strep throat with amoxicillin. She noticed pain and swelling to the radial nail bed of her last night. She tried to open and slightly up at night. She denies any fevers, nausea or vomiting. Denies any chest pain, shortness of breath, abdominal pain.  The history is provided by the patient.    Past Medical History  Diagnosis Date  . Asthma   . Diabetes mellitus   . Osteopenia   . Renal cell carcinoma   . Goiter   . Hypertension   . Hyperlipidemia     Past Surgical History  Procedure Date  . Nephrectomy     Family History  Problem Relation Age of Onset  . Cancer Sister     bladder  . Kidney cancer Father   . Cancer Father     renal  . Coronary artery disease    . Diabetes    . Hyperlipidemia    . Hypertension      History  Substance Use Topics  . Smoking status: Never Smoker   . Smokeless tobacco: Not on file  . Alcohol Use: No    OB History    Grav Para Term Preterm Abortions TAB SAB Ect Mult Living                  Review of Systems  Constitutional: Negative for fever, activity change and appetite change.  Respiratory: Negative for cough, chest tightness and shortness of breath.   Cardiovascular: Negative for chest pain.  Gastrointestinal: Negative for nausea, vomiting and abdominal pain.  Genitourinary: Negative for dysuria and hematuria.  Skin: Positive for wound.  Neurological: Negative for dizziness and headaches.    Allergies  Cefuroxime axetil and Ciprofloxacin  Home Medications   Current Outpatient Rx    Name Route Sig Dispense Refill  . ALBUTEROL SULFATE (2.5 MG/3ML) 0.083% IN NEBU Nebulization Take 3 mLs (2.5 mg total) by nebulization every 6 (six) hours as needed for wheezing. 75 mL 12  . AMBULATORY NON FORMULARY MEDICATION  Diabetic Shoes Wear shoes daily as needed 1 Package 0  . AMLODIPINE BESYLATE 10 MG PO TABS Oral Take 1 tablet (10 mg total) by mouth daily. 90 tablet 3  . AMOXICILLIN 875 MG PO TABS Oral Take 1 tablet (875 mg total) by mouth 2 (two) times daily. 20 tablet 0  . AZELASTINE HCL 0.15 % NA SOLN Nasal 2 sprays by Nasal route daily.      Marland Kitchen FIRST-DUKES MOUTHWASH MT SUSP  1 tsp po swish and spit qid 237 mL 0  . EZETIMIBE-SIMVASTATIN 10-20 MG PO TABS Oral Take 1 tablet by mouth daily. 90 tablet 3  . GLUCOSAMINE CHONDR 1500 COMPLX PO CAPS Oral Take 1 capsule by mouth daily.     Marland Kitchen HYDROCHLOROTHIAZIDE 25 MG PO TABS  take 1 tablet by mouth once daily 30 tablet 2  . INSULIN GLARGINE 100 UNIT/ML [place] SOLN Subcutaneous Inject 75 Units into the skin every evening.     . INSULIN GLULISINE 100 UNIT/ML [place] SOLN Subcutaneous Inject 35-40 Units into the skin daily.     Marland Kitchen  LEVOTHYROXINE SODIUM 25 MCG PO TABS Oral Take 25 mcg by mouth daily.      Marland Kitchen LORAZEPAM 1 MG PO TABS Oral Take 1 tablet (1 mg total) by mouth 2 (two) times daily. 60 tablet 0  . LOSARTAN POTASSIUM 100 MG PO TABS Oral Take 1 tablet (100 mg total) by mouth daily. 30 tablet 5  . METOPROLOL TARTRATE 25 MG PO TABS  take 1 tablet by mouth twice a day 180 tablet 5  . PANTOPRAZOLE SODIUM 40 MG PO TBEC Oral Take 40 mg by mouth daily.      . TRAMADOL HCL 50 MG PO TABS Oral Take 1 tablet (50 mg total) by mouth every 8 (eight) hours as needed for pain. 60 tablet 0    BP 165/75  Pulse 71  Temp(Src) 98.6 F (37 C) (Oral)  Resp 20  SpO2 96%  Physical Exam  Constitutional: She appears well-developed and well-nourished. No distress.  HENT:  Head: Normocephalic and atraumatic.  Mouth/Throat: Oropharynx is clear and moist. No  oropharyngeal exudate.  Eyes: Conjunctivae are normal. Pupils are equal, round, and reactive to light.  Neck: Normal range of motion.  Cardiovascular: Normal rate, regular rhythm and normal heart sounds.   Pulmonary/Chest: Effort normal and breath sounds normal. No respiratory distress.  Abdominal: Soft. There is no tenderness. There is no rebound and no guarding.  Musculoskeletal: Normal range of motion. She exhibits tenderness.       Swelling and erythema to radial nail bed of L third finger.  Purple discoloration under skin.  Neurological: She is alert. No cranial nerve deficit.  Skin: Skin is warm.    ED Course  INCISION AND DRAINAGE Date/Time: 01/19/2012 9:05 PM Performed by: Ezequiel Essex Authorized by: Ezequiel Essex Consent: Verbal consent obtained. Risks and benefits: risks, benefits and alternatives were discussed Consent given by: patient Patient understanding: patient states understanding of the procedure being performed Patient consent: the patient's understanding of the procedure matches consent given Patient identity confirmed: verbally with patient Time out: Immediately prior to procedure a "time out" was called to verify the correct patient, procedure, equipment, support staff and site/side marked as required. Type: abscess Body area: upper extremity Location details: left long finger Anesthesia: digital block Local anesthetic: lidocaine 2% without epinephrine Anesthetic total: 8 ml Scalpel size: 11 Incision type: single straight Complexity: simple Drainage: purulent and bloody Drainage amount: moderate Wound treatment: wound left open Patient tolerance: Patient tolerated the procedure well with no immediate complications.   (including critical care time)  Labs Reviewed - No data to display No results found.   No diagnosis found.    MDM  Swelling and pain to radial nail bed of left third finger. Likely paronychia. We'll x-ray to rule out foreign  body perform I&D.  No FB on Xray. Incision and drainage of paronychia. We'll add Bactrim to the patient's amoxicillin she is already taking for her strep throat.     Ezequiel Essex, MD 01/19/12 2132

## 2012-01-19 NOTE — ED Notes (Signed)
Infection to her left middle finger. Tried to open it with an insulin needle.

## 2012-01-19 NOTE — ED Notes (Signed)
CBG was 326 mg/dcltr

## 2012-01-24 DIAGNOSIS — R229 Localized swelling, mass and lump, unspecified: Secondary | ICD-10-CM | POA: Diagnosis not present

## 2012-01-24 DIAGNOSIS — R1012 Left upper quadrant pain: Secondary | ICD-10-CM | POA: Diagnosis not present

## 2012-01-24 DIAGNOSIS — R634 Abnormal weight loss: Secondary | ICD-10-CM | POA: Diagnosis not present

## 2012-01-26 ENCOUNTER — Other Ambulatory Visit: Payer: Self-pay | Admitting: Family Medicine

## 2012-01-26 NOTE — Telephone Encounter (Signed)
Last seen 01/17/12 and filled 10/30/11 # 23

## 2012-02-01 ENCOUNTER — Telehealth: Payer: Self-pay | Admitting: Family Medicine

## 2012-02-01 MED ORDER — FLUCONAZOLE 150 MG PO TABS: ORAL_TABLET | ORAL | Status: DC

## 2012-02-01 NOTE — Telephone Encounter (Signed)
Please advise   KP

## 2012-02-01 NOTE — Telephone Encounter (Signed)
Pt aware Rx sent.

## 2012-02-01 NOTE — Telephone Encounter (Signed)
Diflucan 150 mg  #2  1 po x1, may repeat in 3 days prn

## 2012-02-01 NOTE — Telephone Encounter (Signed)
Patient called & stated she is finishing up her antibiotic & would like to get a prescription for Diflucan, for possible yeast infection from antibiotics. Can you send to [facility] on [address] Patient ph# [phone]

## 2012-02-02 DIAGNOSIS — M545 Low back pain, unspecified: Secondary | ICD-10-CM | POA: Diagnosis not present

## 2012-02-21 ENCOUNTER — Other Ambulatory Visit: Payer: Self-pay | Admitting: Internal Medicine

## 2012-02-21 NOTE — Telephone Encounter (Signed)
Refill done.

## 2012-02-26 DIAGNOSIS — IMO0002 Reserved for concepts with insufficient information to code with codable children: Secondary | ICD-10-CM | POA: Diagnosis not present

## 2012-02-26 DIAGNOSIS — M546 Pain in thoracic spine: Secondary | ICD-10-CM | POA: Diagnosis not present

## 2012-02-29 ENCOUNTER — Other Ambulatory Visit: Payer: Self-pay | Admitting: Family Medicine

## 2012-03-04 ENCOUNTER — Emergency Department (HOSPITAL_COMMUNITY)
Admission: EM | Admit: 2012-03-04 | Discharge: 2012-03-05 | Disposition: A | Discharge status: Home / Self Care | Payer: Medicare Other | Attending: Emergency Medicine | Admitting: Emergency Medicine

## 2012-03-04 ENCOUNTER — Encounter (HOSPITAL_COMMUNITY): Payer: Self-pay

## 2012-03-04 DIAGNOSIS — S8000XA Contusion of unspecified knee, initial encounter: Secondary | ICD-10-CM | POA: Insufficient documentation

## 2012-03-04 DIAGNOSIS — S022XXA Fracture of nasal bones, initial encounter for closed fracture: Secondary | ICD-10-CM | POA: Insufficient documentation

## 2012-03-04 DIAGNOSIS — T07XXXA Unspecified multiple injuries, initial encounter: Secondary | ICD-10-CM

## 2012-03-04 DIAGNOSIS — E119 Type 2 diabetes mellitus without complications: Secondary | ICD-10-CM | POA: Insufficient documentation

## 2012-03-04 DIAGNOSIS — IMO0002 Reserved for concepts with insufficient information to code with codable children: Secondary | ICD-10-CM | POA: Insufficient documentation

## 2012-03-04 DIAGNOSIS — R071 Chest pain on breathing: Secondary | ICD-10-CM | POA: Insufficient documentation

## 2012-03-04 DIAGNOSIS — S0280XA Fracture of other specified skull and facial bones, unspecified side, initial encounter for closed fracture: Secondary | ICD-10-CM | POA: Diagnosis not present

## 2012-03-04 DIAGNOSIS — R079 Chest pain, unspecified: Secondary | ICD-10-CM | POA: Insufficient documentation

## 2012-03-04 DIAGNOSIS — S0510XA Contusion of eyeball and orbital tissues, unspecified eye, initial encounter: Secondary | ICD-10-CM | POA: Insufficient documentation

## 2012-03-04 DIAGNOSIS — M25562 Pain in left knee: Secondary | ICD-10-CM

## 2012-03-04 DIAGNOSIS — J45909 Unspecified asthma, uncomplicated: Secondary | ICD-10-CM | POA: Insufficient documentation

## 2012-03-04 DIAGNOSIS — M25569 Pain in unspecified knee: Secondary | ICD-10-CM | POA: Insufficient documentation

## 2012-03-04 DIAGNOSIS — W19XXXA Unspecified fall, initial encounter: Secondary | ICD-10-CM

## 2012-03-04 DIAGNOSIS — S0003XA Contusion of scalp, initial encounter: Secondary | ICD-10-CM | POA: Insufficient documentation

## 2012-03-04 DIAGNOSIS — M25869 Other specified joint disorders, unspecified knee: Secondary | ICD-10-CM | POA: Diagnosis not present

## 2012-03-04 DIAGNOSIS — S1093XA Contusion of unspecified part of neck, initial encounter: Secondary | ICD-10-CM | POA: Insufficient documentation

## 2012-03-04 DIAGNOSIS — F0781 Postconcussional syndrome: Secondary | ICD-10-CM | POA: Diagnosis not present

## 2012-03-04 DIAGNOSIS — R220 Localized swelling, mass and lump, head: Secondary | ICD-10-CM | POA: Insufficient documentation

## 2012-03-04 DIAGNOSIS — R51 Headache: Secondary | ICD-10-CM | POA: Insufficient documentation

## 2012-03-04 DIAGNOSIS — R0789 Other chest pain: Secondary | ICD-10-CM

## 2012-03-04 DIAGNOSIS — R040 Epistaxis: Secondary | ICD-10-CM | POA: Insufficient documentation

## 2012-03-04 DIAGNOSIS — S8990XA Unspecified injury of unspecified lower leg, initial encounter: Secondary | ICD-10-CM | POA: Diagnosis not present

## 2012-03-04 DIAGNOSIS — Z9181 History of falling: Secondary | ICD-10-CM | POA: Diagnosis not present

## 2012-03-04 DIAGNOSIS — Z794 Long term (current) use of insulin: Secondary | ICD-10-CM | POA: Insufficient documentation

## 2012-03-04 DIAGNOSIS — S02400A Malar fracture unspecified, initial encounter for closed fracture: Secondary | ICD-10-CM | POA: Insufficient documentation

## 2012-03-04 DIAGNOSIS — E785 Hyperlipidemia, unspecified: Secondary | ICD-10-CM | POA: Insufficient documentation

## 2012-03-04 DIAGNOSIS — Z79899 Other long term (current) drug therapy: Secondary | ICD-10-CM | POA: Insufficient documentation

## 2012-03-04 DIAGNOSIS — R221 Localized swelling, mass and lump, neck: Secondary | ICD-10-CM | POA: Insufficient documentation

## 2012-03-04 DIAGNOSIS — S02401A Maxillary fracture, unspecified, initial encounter for closed fracture: Secondary | ICD-10-CM | POA: Insufficient documentation

## 2012-03-04 DIAGNOSIS — R110 Nausea: Secondary | ICD-10-CM | POA: Diagnosis not present

## 2012-03-04 DIAGNOSIS — R6889 Other general symptoms and signs: Secondary | ICD-10-CM | POA: Diagnosis not present

## 2012-03-04 DIAGNOSIS — W108XXA Fall (on) (from) other stairs and steps, initial encounter: Secondary | ICD-10-CM | POA: Insufficient documentation

## 2012-03-04 DIAGNOSIS — I10 Essential (primary) hypertension: Secondary | ICD-10-CM | POA: Insufficient documentation

## 2012-03-04 NOTE — ED Notes (Signed)
Pt reports falling up the sidewalk approx 2130 this pm, pt reports she tripped on the lip of the sidewalk landing face first on concrete, pt denies LOC or N/V, pt presents w/swelling to the nose, bruising to (L) eye, and abrasions to chin, upper lip, and nose. Bleeding controlled upon arrival, denies taking blood thinners, pt states "my nose feels clogged, I feel like I need to blow my nose." pt instructed not to blow her nose at this time.

## 2012-03-04 NOTE — ED Notes (Signed)
No neuro deficits, facial droop, or arm drift

## 2012-03-04 NOTE — ED Notes (Signed)
Per EMS, pt fell going up a sidewalk, tripped and fell face first into the asphalt, obvious swelling  Noted, small abrasion, bleeding controlled at this time, 20g LFA

## 2012-03-04 NOTE — ED Notes (Signed)
Family at bedside.

## 2012-03-05 ENCOUNTER — Telehealth: Payer: Self-pay | Admitting: Family Medicine

## 2012-03-05 ENCOUNTER — Encounter (HOSPITAL_COMMUNITY): Payer: Self-pay | Admitting: *Deleted

## 2012-03-05 ENCOUNTER — Emergency Department (HOSPITAL_COMMUNITY): Payer: Medicare Other

## 2012-03-05 ENCOUNTER — Other Ambulatory Visit: Payer: Self-pay | Admitting: Neurosurgery

## 2012-03-05 ENCOUNTER — Emergency Department (HOSPITAL_COMMUNITY)
Admission: EM | Admit: 2012-03-05 | Discharge: 2012-03-05 | Disposition: A | Discharge status: Home / Self Care | Payer: Medicare Other | Attending: Emergency Medicine | Admitting: Emergency Medicine

## 2012-03-05 ENCOUNTER — Other Ambulatory Visit: Payer: Self-pay

## 2012-03-05 DIAGNOSIS — R071 Chest pain on breathing: Secondary | ICD-10-CM | POA: Diagnosis not present

## 2012-03-05 DIAGNOSIS — Z7982 Long term (current) use of aspirin: Secondary | ICD-10-CM | POA: Diagnosis not present

## 2012-03-05 DIAGNOSIS — S8000XA Contusion of unspecified knee, initial encounter: Secondary | ICD-10-CM | POA: Diagnosis not present

## 2012-03-05 DIAGNOSIS — S0990XA Unspecified injury of head, initial encounter: Secondary | ICD-10-CM | POA: Insufficient documentation

## 2012-03-05 DIAGNOSIS — F0781 Postconcussional syndrome: Secondary | ICD-10-CM

## 2012-03-05 DIAGNOSIS — E119 Type 2 diabetes mellitus without complications: Secondary | ICD-10-CM | POA: Insufficient documentation

## 2012-03-05 DIAGNOSIS — IMO0002 Reserved for concepts with insufficient information to code with codable children: Secondary | ICD-10-CM | POA: Diagnosis not present

## 2012-03-05 DIAGNOSIS — S0003XA Contusion of scalp, initial encounter: Secondary | ICD-10-CM | POA: Diagnosis not present

## 2012-03-05 DIAGNOSIS — S99919A Unspecified injury of unspecified ankle, initial encounter: Secondary | ICD-10-CM | POA: Diagnosis not present

## 2012-03-05 DIAGNOSIS — Z79899 Other long term (current) drug therapy: Secondary | ICD-10-CM | POA: Insufficient documentation

## 2012-03-05 DIAGNOSIS — W010XXA Fall on same level from slipping, tripping and stumbling without subsequent striking against object, initial encounter: Secondary | ICD-10-CM | POA: Insufficient documentation

## 2012-03-05 DIAGNOSIS — I10 Essential (primary) hypertension: Secondary | ICD-10-CM | POA: Insufficient documentation

## 2012-03-05 DIAGNOSIS — S022XXA Fracture of nasal bones, initial encounter for closed fracture: Secondary | ICD-10-CM | POA: Diagnosis not present

## 2012-03-05 DIAGNOSIS — S8990XA Unspecified injury of unspecified lower leg, initial encounter: Secondary | ICD-10-CM | POA: Diagnosis not present

## 2012-03-05 DIAGNOSIS — E785 Hyperlipidemia, unspecified: Secondary | ICD-10-CM | POA: Insufficient documentation

## 2012-03-05 DIAGNOSIS — S99929A Unspecified injury of unspecified foot, initial encounter: Secondary | ICD-10-CM | POA: Diagnosis not present

## 2012-03-05 DIAGNOSIS — R040 Epistaxis: Secondary | ICD-10-CM | POA: Diagnosis not present

## 2012-03-05 DIAGNOSIS — R110 Nausea: Secondary | ICD-10-CM | POA: Insufficient documentation

## 2012-03-05 DIAGNOSIS — S0510XA Contusion of eyeball and orbital tissues, unspecified eye, initial encounter: Secondary | ICD-10-CM | POA: Insufficient documentation

## 2012-03-05 DIAGNOSIS — J45909 Unspecified asthma, uncomplicated: Secondary | ICD-10-CM | POA: Diagnosis not present

## 2012-03-05 DIAGNOSIS — S02400A Malar fracture unspecified, initial encounter for closed fracture: Secondary | ICD-10-CM | POA: Diagnosis not present

## 2012-03-05 DIAGNOSIS — M25869 Other specified joint disorders, unspecified knee: Secondary | ICD-10-CM | POA: Diagnosis not present

## 2012-03-05 DIAGNOSIS — Z794 Long term (current) use of insulin: Secondary | ICD-10-CM | POA: Diagnosis not present

## 2012-03-05 DIAGNOSIS — R079 Chest pain, unspecified: Secondary | ICD-10-CM | POA: Diagnosis not present

## 2012-03-05 DIAGNOSIS — R220 Localized swelling, mass and lump, head: Secondary | ICD-10-CM | POA: Diagnosis not present

## 2012-03-05 DIAGNOSIS — R51 Headache: Secondary | ICD-10-CM | POA: Diagnosis not present

## 2012-03-05 DIAGNOSIS — Z9181 History of falling: Secondary | ICD-10-CM | POA: Diagnosis not present

## 2012-03-05 DIAGNOSIS — M25569 Pain in unspecified knee: Secondary | ICD-10-CM | POA: Diagnosis not present

## 2012-03-05 MED ORDER — OXYCODONE-ACETAMINOPHEN 5-325 MG PO TABS: 2.0000 | ORAL_TABLET | ORAL | Status: AC | PRN

## 2012-03-05 MED ORDER — HYDROCODONE-ACETAMINOPHEN 5-325 MG PO TABS
1.0000 | ORAL_TABLET | Freq: Once | ORAL | Status: AC
  Administered 2012-03-05: 1 via ORAL
  Filled 2012-03-05: qty 1

## 2012-03-05 MED ORDER — FENTANYL CITRATE 0.05 MG/ML IJ SOLN
100.0000 ug | Freq: Once | INTRAMUSCULAR | Status: AC
  Administered 2012-03-05: 100 ug via INTRAVENOUS
  Filled 2012-03-05: qty 2

## 2012-03-05 MED ORDER — ONDANSETRON 4 MG PO TBDP: 4.0000 mg | ORAL_TABLET | Freq: Three times a day (TID) | ORAL | Status: AC | PRN

## 2012-03-05 MED ORDER — ONDANSETRON HCL 4 MG/2ML IJ SOLN
4.0000 mg | Freq: Once | INTRAMUSCULAR | Status: AC
  Administered 2012-03-05: 4 mg via INTRAVENOUS
  Filled 2012-03-05: qty 2

## 2012-03-05 NOTE — Telephone Encounter (Signed)
Patient's daughter called again for an appointment this afternoon. After reading the patient's symptoms, I connected her with a [facility] emergent triage nurse. BC

## 2012-03-05 NOTE — ED Notes (Signed)
Pt was received to [facility] with c/o dizziness, headache and neck pain that got worse today. Pt was here yesterday after she fell. Bruising noted to periorbital area, swelling and abrasion to the nose, abrasion to the upper lip and chin. Pt is A/A/Ox4, skin is warm and dry, respiration is even and unlabored.

## 2012-03-05 NOTE — Telephone Encounter (Signed)
Spoke with Margaret Dyer and she stated her dad was taking the patient to the hospital and she will up there shortly to check on her.      KP

## 2012-03-05 NOTE — ED Notes (Signed)
Pt's sister reports pt is starting to "get loopy," she thinks the clock on the wall is a piece of tape

## 2012-03-05 NOTE — Discharge Instructions (Signed)
Your CT scans today did not show any brain injury.  You have fractures to your left nasal bone and nasal bridge and to your right maxillary sinus.  Please contact the ENT office for follow up.  Return to the ER for worsening condition or new concerning symptoms.  Keep abrasions clean and dry.  Abrasions Abrasions are skin scrapes. Their treatment depends on how large and deep the abrasion is. Abrasions do not extend through all layers of the skin. A cut or lesion through all skin layers is called a laceration. HOME CARE INSTRUCTIONS   If you were given a dressing, change it at least once a day or as instructed by your caregiver. If the bandage sticks, soak it off with a solution of water or hydrogen peroxide.   Twice a day, wash the area with soap and water to remove all the cream/ointment. You may do this in a sink, under a tub faucet, or in a shower. Rinse off the soap and pat dry with a clean towel. Look for signs of infection (see below).   Reapply cream/ointment according to your caregiver's instruction. This will help prevent infection and keep the bandage from sticking. Telfa or gauze over the wound and under the dressing or wrap will also help keep the bandage from sticking.   If the bandage becomes wet, dirty, or develops a foul smell, change it as soon as possible.   Only take over-the-counter or prescription medicines for pain, discomfort, or fever as directed by your caregiver.  SEEK IMMEDIATE MEDICAL CARE IF:   Increasing pain in the wound.   Signs of infection develop: redness, swelling, surrounding area is tender to touch, or pus coming from the wound.   You have a fever.   Any foul smell coming from the wound or dressing.  Most skin wounds heal within ten days. Facial wounds heal faster. However, an infection may occur despite proper treatment. You should have the wound checked for signs of infection within 24 to 48 hours or sooner if problems arise. If you were not given a  wound-check appointment, look closely at the wound yourself on the second day for early signs of infection listed above. MAKE SURE YOU:   Understand these instructions.   Will watch your condition.   Will get help right away if you are not doing well or get worse.  Document Released: 08/30/2005 Document Revised: 11/09/2011 Document Reviewed: 10/24/2011 [facility] Patient Information 2012 [place], [place].   Chest Wall Pain Chest wall pain is pain in or around the bones and muscles of your chest. It may take up to 6 weeks to get better. It may take longer if you must stay physically active in your work and activities.  CAUSES  Chest wall pain may happen on its own. However, it may be caused by:  A viral illness like the flu.   Injury.   Coughing.   Exercise.   Arthritis.   Fibromyalgia.   Shingles.  HOME CARE INSTRUCTIONS   Avoid overtiring physical activity. Try not to strain or perform activities that cause pain. This includes any activities using your chest or your abdominal and side muscles, especially if heavy weights are used.   Put ice on the sore area.   Put ice in a plastic bag.   Place a towel between your skin and the bag.   Leave the ice on for 15 to 20 minutes per hour while awake for the first 2 days.   Only take over-the-counter  or prescription medicines for pain, discomfort, or fever as directed by your caregiver.  SEEK IMMEDIATE MEDICAL CARE IF:   Your pain increases, or you are very uncomfortable.   You have a fever.   Your chest pain becomes worse.   You have new, unexplained symptoms.   You have nausea or vomiting.   You feel sweaty or lightheaded.   You have a cough with phlegm (sputum), or you cough up blood.  MAKE SURE YOU:   Understand these instructions.   Will watch your condition.   Will get help right away if you are not doing well or get worse.  Document Released: 11/20/2005 Document Revised: 11/09/2011 Document Reviewed:  07/17/2011 [facility] Patient Information 2012 [place], [place].  Facial Fracture A facial fracture is a break in one of the bones of your face. HOME CARE INSTRUCTIONS   Protect the injured part of your face until it is healed.   Do not participate in activities which give chance for re-injury until your doctor approves.   Gently wash and dry your face.   Wear head and facial protection while riding a bicycle, motorcycle, or snowmobile.  SEEK MEDICAL CARE IF:   An oral temperature above 102 F (38.9 C) develops.   You have severe headaches or notice changes in your vision.   You have new numbness or tingling in your face.   You develop nausea (feeling sick to your stomach), vomiting or a stiff neck.  SEEK IMMEDIATE MEDICAL CARE IF:   You develop difficulty seeing or experience double vision.   You become dizzy, lightheaded, or faint.   You develop trouble speaking, breathing, or swallowing.   You have a watery discharge from your nose or ear.  MAKE SURE YOU:   Understand these instructions.   Will watch your condition.   Will get help right away if you are not doing well or get worse.  Document Released: 11/20/2005 Document Revised: 11/09/2011 Document Reviewed: 07/09/2008 [facility] Patient Information 2012 [place], [place].  Nasal Fracture A nasal fracture is a break or crack in the bones of the nose. A minor break usually heals in a month. You often will receive black eyes from a nasal fracture. This is not a cause for concern. The black eyes will go away over 1 to 2 weeks.  DIAGNOSIS  Your caregiver may want to examine you if you are concerned about a fracture of the nose. X-rays of the nose may not show a nasal fracture even when one is present. Sometimes your caregiver must wait 1 to 5 days after the injury to re-check the nose for alignment and to take additional X-rays. Sometimes the caregiver must wait until the swelling has gone down. TREATMENT Minor fractures that  have caused no deformity often do not require treatment. More serious fractures where bones are displaced may require surgery. This will take place after the swelling is gone. Surgery will stabilize and align the fracture. HOME CARE INSTRUCTIONS   Put ice on the injured area.   Put ice in a plastic bag.   Place a towel between your skin and the bag.   Leave the ice on for 15 to 20 minutes, 3 to 4 times a day.   Take medications as directed by your caregiver.   Only take over-the-counter or prescription medicines for pain, discomfort, or fever as directed by your caregiver.   If your nose starts bleeding, squeeze the soft parts of the nose against the center wall while you are sitting  in an upright position for 10 minutes.   Contact sports should be avoided for at least 3 to 4 weeks or as directed by your caregiver.  SEEK MEDICAL CARE IF:  Your pain increases or becomes severe.   You continue to have nosebleeds.   The shape of your nose does not return to normal within 5 days.   You have pus draining from the nose.  SEEK IMMEDIATE MEDICAL CARE IF:   You have bleeding from your nose that does not stop after 20 minutes of pinching the nostrils closed and keeping ice on the nose.   You have clear fluid draining from your nose.   You notice a grape-like swelling on the dividing wall between the nostrils (septum). This is a collection of blood (hematoma) that must be drained to help prevent infection.   You have difficulty moving your eyes.   You have recurrent vomiting.  Document Released: 11/17/2000 Document Revised: 11/09/2011 Document Reviewed: 03/06/2011 [facility] Patient Information 2012 [place], [place].

## 2012-03-05 NOTE — ED Provider Notes (Signed)
History     CSN: [id number]  Arrival date & time 03/04/12  2223   First MD Initiated Contact with Patient 03/04/12 2259      Chief Complaint  Patient presents with  . Fall    (Consider location/radiation/quality/duration/timing/severity/associated sxs/prior treatment) HPI 77 year old female presents emergency department via EMS after tripping up a set of stairs and landing on her face hitting concrete. Patient may have had brief period of LOC, and she cannot remember what she went outside to do. Patient with bruising swelling and pain to her nose, she reports she had a nosebleed though it is now better. She has bruising around her eyes and a headache. Patient also complaining of pain to left knee from scraping her knee. Patient also with left-sided chest pain after striking her chest against the stair. Patient reports she has chronic ongoing left-sided breast pain and referred back pain for which she is currently undergoing evaluation and treatment. Past Medical History  Diagnosis Date  . Asthma   . Diabetes mellitus   . Osteopenia   . Renal cell carcinoma   . Goiter   . Hypertension   . Hyperlipidemia     Past Surgical History  Procedure Date  . Nephrectomy     Family History  Problem Relation Age of Onset  . Cancer Sister     bladder  . Kidney cancer Father   . Cancer Father     renal  . Coronary artery disease    . Diabetes    . Hyperlipidemia    . Hypertension      History  Substance Use Topics  . Smoking status: Never Smoker   . Smokeless tobacco: Not on file  . Alcohol Use: No    OB History    Grav Para Term Preterm Abortions TAB SAB Ect Mult Living                  Review of Systems  All other systems reviewed and are negative.   other than outlined in the history of present illness  Allergies  Cefuroxime axetil and Ciprofloxacin  Home Medications   Current Outpatient Rx  Name Route Sig Dispense Refill  . AMLODIPINE BESYLATE 10 MG PO TABS  Oral Take 10 mg by mouth at bedtime.    Marland Kitchen EZETIMIBE-SIMVASTATIN 10-20 MG PO TABS Oral Take 1 tablet by mouth daily. 90 tablet 3  . GLUCOSAMINE CHONDR 1500 COMPLX PO CAPS Oral Take 1 capsule by mouth daily as needed. For leg cramps    . HYDROCHLOROTHIAZIDE 25 MG PO TABS  take 1 tablet by mouth once daily 30 tablet 2  . HYDROCODONE-ACETAMINOPHEN 5-500 MG PO TABS Oral Take 1-2 tablets by mouth every 6 (six) hours as needed. For pain    . INSULIN GLARGINE 100 UNIT/ML [place] SOLN Subcutaneous Inject 85 Units into the skin every evening.     . INSULIN GLULISINE 100 UNIT/ML [place] SOLN Subcutaneous Inject 35-40 Units into the skin every morning.     Marland Kitchen LEVOTHYROXINE SODIUM 25 MCG PO TABS Oral Take 25 mcg by mouth daily.      Marland Kitchen LORAZEPAM 1 MG PO TABS Oral Take 1 mg by mouth 2 (two) times daily as needed. For anxiety    . LOSARTAN POTASSIUM 100 MG PO TABS  take 1 tablet by mouth once daily 30 tablet 2  . METOPROLOL TARTRATE 25 MG PO TABS  take 1 tablet by mouth twice a day 180 tablet 5  . PANTOPRAZOLE SODIUM 40 MG  PO TBEC Oral Take 40 mg by mouth daily.      . TRAMADOL HCL 50 MG PO TABS Oral Take 50 mg by mouth every 8 (eight) hours as needed. For pain      BP 157/68  Pulse 60  Temp(Src) 98.5 F (36.9 C) (Oral)  Resp 16  SpO2 98%  Physical Exam  Nursing note and vitals reviewed. Constitutional: She is oriented to person, place, and time. She appears well-developed and well-nourished.  HENT:  Right Ear: External ear normal.  Left Ear: External ear normal.  Mouth/Throat: Oropharynx is clear and moist.       Abrasions to 4 head, bridge of nose, chin. Deformity and swelling noted across the bridge of the nose. Patient with ecchymosis under both eyes. 2 small abrasions to the bridge of the nose each about 0.5 cm with bleeding controlled.  Eyes: Conjunctivae and EOM are normal. Pupils are equal, round, and reactive to light. Right eye exhibits no discharge. Left eye exhibits no discharge. No scleral icterus.    Neck: Normal range of motion. Neck supple. No JVD present. No tracheal deviation present. No thyromegaly present.  Cardiovascular: Normal rate, regular rhythm, normal heart sounds and intact distal pulses.  Exam reveals no gallop and no friction rub.   No murmur heard. Pulmonary/Chest: Effort normal and breath sounds normal. No stridor. No respiratory distress. She has no wheezes. She has no rales. She exhibits tenderness.       Patient with significant soft tissue tenderness to left breast and left-sided chest without crepitus or deep tissue tenderness  Abdominal: Soft. Bowel sounds are normal. She exhibits no distension and no mass. There is no tenderness. There is no rebound and no guarding.  Musculoskeletal: She exhibits tenderness. She exhibits no edema.       Ecchymosis and abrasions noted to left lateral knee without ligamentous laxity or injury noted  Lymphadenopathy:    She has no cervical adenopathy.  Neurological: She is alert and oriented to person, place, and time. She has normal reflexes. No cranial nerve deficit. She exhibits normal muscle tone. Coordination normal.  Skin: Skin is warm and dry. No rash noted. No erythema. No pallor.  Psychiatric: She has a normal mood and affect. Her behavior is normal. Judgment and thought content normal.    ED Course  Procedures (including critical care time)  Labs Reviewed - No data to display Dg Chest 2 View  03/05/2012  *RADIOLOGY REPORT*  Clinical Data: [place].  Chest pain.  CHEST - 2 VIEW  Comparison: 09/29/2011.  Findings: The heart is enlarged but stable.  There is mild tortuosity and calcification of the thoracic aorta.  The lungs are clear.  No pleural effusion.  The bony thorax is intact.  No definite vertebral body or rib fractures.  IMPRESSION: No acute cardiopulmonary findings and intact bony thorax.  Original Report Authenticated By: P. Kalman Jewels, M.D.   [place] Knee 2 Views Left  03/05/2012  *RADIOLOGY REPORT*  Clinical Data:  [place].  Injured knee.  LEFT KNEE - 1-2 VIEW  Comparison: None  Findings: There are tricompartmental degenerative changes most notable in the medial compartment with joint space narrowing, osteophytic spurring and chondrocalcinosis.  No acute fracture or osteochondral lesion.  No joint effusion.  IMPRESSION:  1.  Tricompartmental degenerative changes. 2.  No acute fracture or joint effusion.  Original Report Authenticated By: P. Kalman Jewels, M.D.   Ct Head Wo Contrast  03/05/2012  *RADIOLOGY REPORT*  Clinical Data:  Tripped and  fell.  Head and facial injuries.  CT HEAD WITHOUT CONTRAST CT MAXILLOFACIAL WITHOUT CONTRAST CT CERVICAL SPINE WITHOUT CONTRAST  Technique:  Multidetector CT imaging of the head, cervical spine, and maxillofacial structures were performed using the standard protocol without intravenous contrast. Multiplanar CT image reconstructions of the cervical spine and maxillofacial structures were also generated.  Comparison:  None  CT HEAD  Findings: There is age related cerebral atrophy.  The ventricles are normal.  No extra-axial fluid collections are identified. Benign appearing bilateral basal ganglia calcifications are noted. No CT findings for infarction or hemorrhage.  But no mass lesions. The brainstem and cerebellum are unremarkable.  No acute skull fracture.  Nasal bone fractures are noted and there is fluid in the right maxillary sinus.  The mastoid air cells are clear.  IMPRESSION:  1.  No acute intracranial findings. 2.  No acute skull fracture. 3.  Nasal bone fractures.  CT MAXILLOFACIAL  Findings:  There is a minimally depressed left nasal bone fracture and fracture of the nasal bridge.  This also a fracture at the base of the right nasal maxillary spine.  The bony nasal septum is intact.  There is a small nondisplaced fracture involving the lateral wall of the right maxillary sinus inferiorly.  Moderate fluid in the right maxillary sinus.  The orbital floor is intact.  The medial  orbital wall is intact.  No orbital bone fractures.  The mandibular condyles are normally located.  No mandible fracture.  IMPRESSION:  1.  Nasal bone fractures. 2.  Lateral wall right maxillary sinus fracture.  CT CERVICAL SPINE  Findings:   The sagittal reformatted images demonstrate degenerative cervical spondylosis with disc disease and facet disease.  No acute fracture or abnormal prevertebral soft tissue swelling.  A central disc protrusion is noted at C4-5.  The partially calcified disc protrusion also noted at C6-7.  No significant canal compromise or foraminal stenosis.  IMPRESSION:  1.  Degenerative cervical spondylosis with disc disease and facet disease.  Disc protrusion is noted at C4-5 and C6-7. 2.  No acute bony findings.  Original Report Authenticated By: P. Kalman Jewels, M.D.   Ct Cervical Spine Wo Contrast  03/05/2012  *RADIOLOGY REPORT*  Clinical Data:  Tripped and fell.  Head and facial injuries.  CT HEAD WITHOUT CONTRAST CT MAXILLOFACIAL WITHOUT CONTRAST CT CERVICAL SPINE WITHOUT CONTRAST  Technique:  Multidetector CT imaging of the head, cervical spine, and maxillofacial structures were performed using the standard protocol without intravenous contrast. Multiplanar CT image reconstructions of the cervical spine and maxillofacial structures were also generated.  Comparison:  None  CT HEAD  Findings: There is age related cerebral atrophy.  The ventricles are normal.  No extra-axial fluid collections are identified. Benign appearing bilateral basal ganglia calcifications are noted. No CT findings for infarction or hemorrhage.  But no mass lesions. The brainstem and cerebellum are unremarkable.  No acute skull fracture.  Nasal bone fractures are noted and there is fluid in the right maxillary sinus.  The mastoid air cells are clear.  IMPRESSION:  1.  No acute intracranial findings. 2.  No acute skull fracture. 3.  Nasal bone fractures.  CT MAXILLOFACIAL  Findings:  There is a minimally  depressed left nasal bone fracture and fracture of the nasal bridge.  This also a fracture at the base of the right nasal maxillary spine.  The bony nasal septum is intact.  There is a small nondisplaced fracture involving the lateral wall of the right maxillary  sinus inferiorly.  Moderate fluid in the right maxillary sinus.  The orbital floor is intact.  The medial orbital wall is intact.  No orbital bone fractures.  The mandibular condyles are normally located.  No mandible fracture.  IMPRESSION:  1.  Nasal bone fractures. 2.  Lateral wall right maxillary sinus fracture.  CT CERVICAL SPINE  Findings:   The sagittal reformatted images demonstrate degenerative cervical spondylosis with disc disease and facet disease.  No acute fracture or abnormal prevertebral soft tissue swelling.  A central disc protrusion is noted at C4-5.  The partially calcified disc protrusion also noted at C6-7.  No significant canal compromise or foraminal stenosis.  IMPRESSION:  1.  Degenerative cervical spondylosis with disc disease and facet disease.  Disc protrusion is noted at C4-5 and C6-7. 2.  No acute bony findings.  Original Report Authenticated By: P. Kalman Jewels, M.D.   Ct Maxillofacial Wo Cm  03/05/2012  *RADIOLOGY REPORT*  Clinical Data:  Tripped and fell.  Head and facial injuries.  CT HEAD WITHOUT CONTRAST CT MAXILLOFACIAL WITHOUT CONTRAST CT CERVICAL SPINE WITHOUT CONTRAST  Technique:  Multidetector CT imaging of the head, cervical spine, and maxillofacial structures were performed using the standard protocol without intravenous contrast. Multiplanar CT image reconstructions of the cervical spine and maxillofacial structures were also generated.  Comparison:  None  CT HEAD  Findings: There is age related cerebral atrophy.  The ventricles are normal.  No extra-axial fluid collections are identified. Benign appearing bilateral basal ganglia calcifications are noted. No CT findings for infarction or hemorrhage.  But no mass  lesions. The brainstem and cerebellum are unremarkable.  No acute skull fracture.  Nasal bone fractures are noted and there is fluid in the right maxillary sinus.  The mastoid air cells are clear.  IMPRESSION:  1.  No acute intracranial findings. 2.  No acute skull fracture. 3.  Nasal bone fractures.  CT MAXILLOFACIAL  Findings:  There is a minimally depressed left nasal bone fracture and fracture of the nasal bridge.  This also a fracture at the base of the right nasal maxillary spine.  The bony nasal septum is intact.  There is a small nondisplaced fracture involving the lateral wall of the right maxillary sinus inferiorly.  Moderate fluid in the right maxillary sinus.  The orbital floor is intact.  The medial orbital wall is intact.  No orbital bone fractures.  The mandibular condyles are normally located.  No mandible fracture.  IMPRESSION:  1.  Nasal bone fractures. 2.  Lateral wall right maxillary sinus fracture.  CT CERVICAL SPINE  Findings:   The sagittal reformatted images demonstrate degenerative cervical spondylosis with disc disease and facet disease.  No acute fracture or abnormal prevertebral soft tissue swelling.  A central disc protrusion is noted at C4-5.  The partially calcified disc protrusion also noted at C6-7.  No significant canal compromise or foraminal stenosis.  IMPRESSION:  1.  Degenerative cervical spondylosis with disc disease and facet disease.  Disc protrusion is noted at C4-5 and C6-7. 2.  No acute bony findings.  Original Report Authenticated By: P. Kalman Jewels, M.D.     1. Fall   2. Fracture of nasal bone   3. Fracture of maxillary sinus   4. Abrasions of multiple sites   5. Chest wall pain   6. Left knee pain      Date: 03/05/2012  Rate:60  Rhythm: normal sinus rhythm  QRS Axis: normal  Intervals: normal  ST/T Wave abnormalities:  nonspecific T wave changes  Conduction Disutrbances:none  Narrative Interpretation:   Old EKG Reviewed: unchanged   MDM   77 year old female status post fall. Workup revealed nasal bone fractures, and right maxillary sinus fracture. This was discussed with patient and family and followup with ENT was strongly suggested. Patient reports pain is better. Of note patient refused suture repair of her small lacerations to the bridge of her nose. Nursing staff has placed a Steri-Strip in place, and patient is happy with this result. Patient currently on Norco for her ongoing back and breast pain.  it was discussed with the patient and I will hold on prescribing further narcotics given her fall.        Kalman Drape, MD 03/05/12 [phone]

## 2012-03-05 NOTE — ED Provider Notes (Signed)
History     CSN: [id number]  Arrival date & time 03/05/12  1721   First MD Initiated Contact with Patient 03/05/12 1740      Chief Complaint  Patient presents with  . Fall  . Nausea  . Headache     HPI Patient presents emergency room after suffering a fall yesterday and striking her face.  She was seen in the emergency department had a CT of the head which was negative she had a maxillofacial CT which showed a lateral wall maxillary sinus fracture.  She had a CT of the C-spine which showed no fractures.  Her chief complaint today is headache with dizziness and nausea she had one episode of vomiting.  She did take only one tramadol this morning.  Otherwise she has no complaints of numbness weakness decrease in mental status. Past Medical History  Diagnosis Date  . Asthma   . Diabetes mellitus   . Osteopenia   . Renal cell carcinoma   . Goiter   . Hypertension   . Hyperlipidemia     Past Surgical History  Procedure Date  . Nephrectomy     Family History  Problem Relation Age of Onset  . Cancer Sister     bladder  . Kidney cancer Father   . Cancer Father     renal  . Coronary artery disease    . Diabetes    . Hyperlipidemia    . Hypertension      History  Substance Use Topics  . Smoking status: Never Smoker   . Smokeless tobacco: Not on file  . Alcohol Use: No    OB History    Grav Para Term Preterm Abortions TAB SAB Ect Mult Living                  Review of Systems  All other systems reviewed and are negative.    Allergies  Cefuroxime axetil and Ciprofloxacin  Home Medications   Current Outpatient Rx  Name Route Sig Dispense Refill  . AMLODIPINE BESYLATE 10 MG PO TABS Oral Take 10 mg by mouth at bedtime.    . ASPIRIN 325 MG PO TBEC Oral Take 325 mg by mouth every 4 (four) hours as needed. For pain    . EZETIMIBE-SIMVASTATIN 10-20 MG PO TABS Oral Take 1 tablet by mouth at bedtime.    Marland Kitchen HYDROCODONE-ACETAMINOPHEN 5-500 MG PO TABS Oral Take 1-2  tablets by mouth every 6 (six) hours as needed. For pain    . INSULIN GLARGINE 100 UNIT/ML [place] SOLN Subcutaneous Inject 85 Units into the skin every evening.     . INSULIN GLULISINE 100 UNIT/ML [place] SOLN Subcutaneous Inject 35-40 Units into the skin every morning.     Marland Kitchen LEVOTHYROXINE SODIUM 25 MCG PO TABS Oral Take 25 mcg by mouth daily.      Marland Kitchen LOSARTAN POTASSIUM 100 MG PO TABS  take 1 tablet by mouth once daily 30 tablet 2  . METOPROLOL TARTRATE 25 MG PO TABS  take 1 tablet by mouth twice a day 180 tablet 5  . PANTOPRAZOLE SODIUM 40 MG PO TBEC Oral Take 40 mg by mouth daily.      Marland Kitchen ONDANSETRON 4 MG PO TBDP Oral Take 1 tablet (4 mg total) by mouth every 8 (eight) hours as needed for nausea. 20 tablet 0  . OXYCODONE-ACETAMINOPHEN 5-325 MG PO TABS Oral Take 2 tablets by mouth every 4 (four) hours as needed for pain. 20 tablet 0  BP 144/72  Pulse 63  Temp(Src) 98.9 F (37.2 C) (Axillary)  Resp 16  SpO2 99%  Physical Exam  Nursing note and vitals reviewed. Constitutional: She is oriented to person, place, and time. She appears well-developed and well-nourished. No distress.  HENT:  Head: Normocephalic.         Patient has bilateral periorbital ecchymoses.  Eyes: Pupils are equal, round, and reactive to light.  Neck: Normal range of motion.  Cardiovascular: Normal rate and intact distal pulses.   Pulmonary/Chest: No respiratory distress.  Abdominal: Normal appearance. She exhibits no distension.  Musculoskeletal: Normal range of motion.  Neurological: She is alert and oriented to person, place, and time. She has normal strength. No cranial nerve deficit or sensory deficit. GCS eye subscore is 4. GCS verbal subscore is 5. GCS motor subscore is 6.  Skin: Skin is warm and dry. No rash noted.  Psychiatric: She has a normal mood and affect. Her behavior is normal.    ED Course  Procedures (including critical care time)  Scheduled Meds:    . fentaNYL  100 mcg Intravenous Once  .  ondansetron  4 mg Intravenous Once   After treatment in the ED the patient feels back to baseline and wants to go home. :    Labs Reviewed - No data to display Dg Chest 2 View  03/05/2012  *RADIOLOGY REPORT*  Clinical Data: [place].  Chest pain.  CHEST - 2 VIEW  Comparison: 09/29/2011.  Findings: The heart is enlarged but stable.  There is mild tortuosity and calcification of the thoracic aorta.  The lungs are clear.  No pleural effusion.  The bony thorax is intact.  No definite vertebral body or rib fractures.  IMPRESSION: No acute cardiopulmonary findings and intact bony thorax.  Original Report Authenticated By: P. Kalman Jewels, M.D.   [place] Knee 2 Views Left  03/05/2012  *RADIOLOGY REPORT*  Clinical Data: [place].  Injured knee.  LEFT KNEE - 1-2 VIEW  Comparison: None  Findings: There are tricompartmental degenerative changes most notable in the medial compartment with joint space narrowing, osteophytic spurring and chondrocalcinosis.  No acute fracture or osteochondral lesion.  No joint effusion.  IMPRESSION:  1.  Tricompartmental degenerative changes. 2.  No acute fracture or joint effusion.  Original Report Authenticated By: P. Kalman Jewels, M.D.   Ct Head Wo Contrast  03/05/2012  *RADIOLOGY REPORT*  Clinical Data:  Tripped and fell.  Head and facial injuries.  CT HEAD WITHOUT CONTRAST CT MAXILLOFACIAL WITHOUT CONTRAST CT CERVICAL SPINE WITHOUT CONTRAST  Technique:  Multidetector CT imaging of the head, cervical spine, and maxillofacial structures were performed using the standard protocol without intravenous contrast. Multiplanar CT image reconstructions of the cervical spine and maxillofacial structures were also generated.  Comparison:  None  CT HEAD  Findings: There is age related cerebral atrophy.  The ventricles are normal.  No extra-axial fluid collections are identified. Benign appearing bilateral basal ganglia calcifications are noted. No CT findings for infarction or hemorrhage.  But no mass  lesions. The brainstem and cerebellum are unremarkable.  No acute skull fracture.  Nasal bone fractures are noted and there is fluid in the right maxillary sinus.  The mastoid air cells are clear.  IMPRESSION:  1.  No acute intracranial findings. 2.  No acute skull fracture. 3.  Nasal bone fractures.  CT MAXILLOFACIAL  Findings:  There is a minimally depressed left nasal bone fracture and fracture of the nasal bridge.  This also a fracture at  the base of the right nasal maxillary spine.  The bony nasal septum is intact.  There is a small nondisplaced fracture involving the lateral wall of the right maxillary sinus inferiorly.  Moderate fluid in the right maxillary sinus.  The orbital floor is intact.  The medial orbital wall is intact.  No orbital bone fractures.  The mandibular condyles are normally located.  No mandible fracture.  IMPRESSION:  1.  Nasal bone fractures. 2.  Lateral wall right maxillary sinus fracture.  CT CERVICAL SPINE  Findings:   The sagittal reformatted images demonstrate degenerative cervical spondylosis with disc disease and facet disease.  No acute fracture or abnormal prevertebral soft tissue swelling.  A central disc protrusion is noted at C4-5.  The partially calcified disc protrusion also noted at C6-7.  No significant canal compromise or foraminal stenosis.  IMPRESSION:  1.  Degenerative cervical spondylosis with disc disease and facet disease.  Disc protrusion is noted at C4-5 and C6-7. 2.  No acute bony findings.  Original Report Authenticated By: P. Kalman Jewels, M.D.   Ct Cervical Spine Wo Contrast  03/05/2012  *RADIOLOGY REPORT*  Clinical Data:  Tripped and fell.  Head and facial injuries.  CT HEAD WITHOUT CONTRAST CT MAXILLOFACIAL WITHOUT CONTRAST CT CERVICAL SPINE WITHOUT CONTRAST  Technique:  Multidetector CT imaging of the head, cervical spine, and maxillofacial structures were performed using the standard protocol without intravenous contrast. Multiplanar CT image  reconstructions of the cervical spine and maxillofacial structures were also generated.  Comparison:  None  CT HEAD  Findings: There is age related cerebral atrophy.  The ventricles are normal.  No extra-axial fluid collections are identified. Benign appearing bilateral basal ganglia calcifications are noted. No CT findings for infarction or hemorrhage.  But no mass lesions. The brainstem and cerebellum are unremarkable.  No acute skull fracture.  Nasal bone fractures are noted and there is fluid in the right maxillary sinus.  The mastoid air cells are clear.  IMPRESSION:  1.  No acute intracranial findings. 2.  No acute skull fracture. 3.  Nasal bone fractures.  CT MAXILLOFACIAL  Findings:  There is a minimally depressed left nasal bone fracture and fracture of the nasal bridge.  This also a fracture at the base of the right nasal maxillary spine.  The bony nasal septum is intact.  There is a small nondisplaced fracture involving the lateral wall of the right maxillary sinus inferiorly.  Moderate fluid in the right maxillary sinus.  The orbital floor is intact.  The medial orbital wall is intact.  No orbital bone fractures.  The mandibular condyles are normally located.  No mandible fracture.  IMPRESSION:  1.  Nasal bone fractures. 2.  Lateral wall right maxillary sinus fracture.  CT CERVICAL SPINE  Findings:   The sagittal reformatted images demonstrate degenerative cervical spondylosis with disc disease and facet disease.  No acute fracture or abnormal prevertebral soft tissue swelling.  A central disc protrusion is noted at C4-5.  The partially calcified disc protrusion also noted at C6-7.  No significant canal compromise or foraminal stenosis.  IMPRESSION:  1.  Degenerative cervical spondylosis with disc disease and facet disease.  Disc protrusion is noted at C4-5 and C6-7. 2.  No acute bony findings.  Original Report Authenticated By: P. Kalman Jewels, M.D.   Ct Maxillofacial Wo Cm  03/05/2012  *RADIOLOGY  REPORT*  Clinical Data:  Tripped and fell.  Head and facial injuries.  CT HEAD WITHOUT CONTRAST CT MAXILLOFACIAL WITHOUT CONTRAST  CT CERVICAL SPINE WITHOUT CONTRAST  Technique:  Multidetector CT imaging of the head, cervical spine, and maxillofacial structures were performed using the standard protocol without intravenous contrast. Multiplanar CT image reconstructions of the cervical spine and maxillofacial structures were also generated.  Comparison:  None  CT HEAD  Findings: There is age related cerebral atrophy.  The ventricles are normal.  No extra-axial fluid collections are identified. Benign appearing bilateral basal ganglia calcifications are noted. No CT findings for infarction or hemorrhage.  But no mass lesions. The brainstem and cerebellum are unremarkable.  No acute skull fracture.  Nasal bone fractures are noted and there is fluid in the right maxillary sinus.  The mastoid air cells are clear.  IMPRESSION:  1.  No acute intracranial findings. 2.  No acute skull fracture. 3.  Nasal bone fractures.  CT MAXILLOFACIAL  Findings:  There is a minimally depressed left nasal bone fracture and fracture of the nasal bridge.  This also a fracture at the base of the right nasal maxillary spine.  The bony nasal septum is intact.  There is a small nondisplaced fracture involving the lateral wall of the right maxillary sinus inferiorly.  Moderate fluid in the right maxillary sinus.  The orbital floor is intact.  The medial orbital wall is intact.  No orbital bone fractures.  The mandibular condyles are normally located.  No mandible fracture.  IMPRESSION:  1.  Nasal bone fractures. 2.  Lateral wall right maxillary sinus fracture.  CT CERVICAL SPINE  Findings:   The sagittal reformatted images demonstrate degenerative cervical spondylosis with disc disease and facet disease.  No acute fracture or abnormal prevertebral soft tissue swelling.  A central disc protrusion is noted at C4-5.  The partially calcified disc  protrusion also noted at C6-7.  No significant canal compromise or foraminal stenosis.  IMPRESSION:  1.  Degenerative cervical spondylosis with disc disease and facet disease.  Disc protrusion is noted at C4-5 and C6-7. 2.  No acute bony findings.  Original Report Authenticated By: P. Kalman Jewels, M.D.     1. Postconcussive syndrome       MDM  Plan at this time is to admit to CDU for pain control.  If we get her pain under control we'll consult her to by mouth medication along with antinausea medication and she can rest at home.  I did discuss in detail postconcussive syndrome symptoms and treatment with her and her husband.        Dot Lanes, MD 03/05/12 2133

## 2012-03-05 NOTE — Telephone Encounter (Signed)
Caller: Margaret Dyer/Patient; PCP: Rosalita Chessman.; CB#: [phone]; ; ; Calling emergent line regarding Nausea/ Vomiting and Dizziness; tripped and fell onto the concrete 03/04/12.  Injured the center of the face, broke the nose, and has swelling noted to the eyes and L side of the face.   Seen in ED 03/04/12 pm after the fall, and xray of knee, chest, and head were negative.  CT negative, except for fractures of nose and sinus bones.  States onset of dizziness, increasing headache, and nausea and vomiting during the day 03/05/12.  Patient is alert and can speak well.  Per protocol, advised ED; patient agrees to go to [facility] ED for evaluation.  Info to office.

## 2012-03-05 NOTE — ED Notes (Signed)
Was seen and examined by Dr. Audie Pinto

## 2012-03-05 NOTE — Telephone Encounter (Signed)
Patient Daughter [name] called & stated patient had a bad fall & was taken to ED yesterday. Patient is not any better & is now experiencing new symptoms of Nausea, vomiting dizziness & neck pain. Patients daughter wanted an appointment as soon as possible with Dr. Etter Sjogren. I don't see any available 30-minute slots, would it be ok for her to see another provider in our office or do you want to review the schedule & let me know what will work best for Dr. Etter Sjogren? [name] ph# [phone]

## 2012-03-05 NOTE — ED Notes (Signed)
Pt was seen last night after falling face forward. Pt with noted swelling and bruising to nose and eyes. Pt reports severe headache and neck pain associated with dizziness, nausea, and vomiting.

## 2012-03-05 NOTE — ED Notes (Signed)
Pt c/o anterior chest pain d/t fall

## 2012-03-05 NOTE — ED Notes (Signed)
Patient transported to CT 

## 2012-03-05 NOTE — Discharge Instructions (Signed)
Concussion and Brain Injury A blow to the head can stop the brain from working normally (concussion). It is usually not life-threatening. However, the results of the injury can be serious. Problems caused by the injury might show up right away or days or weeks later. Getting better might take some time. HOME CARE  Rest your body. Ways to rest your body include:   Getting plenty of sleep at night.   Going to sleep early.   Taking naps during the day when you feel tired.   Limit activities that require a lot of thought. This includes:   Time spent with homework.   Time spent with work related to a job.   TV watching.   Computer use.   Return to normal activities (driving, work, school) only when your doctor says it is okay.   Avoid high impact activity and sports until your doctor says it is okay.   Take medicines only as told by your doctor.   Do not drink alcohol until your doctor says it is okay.   Do not make important decisions without help until you feel better.   Follow up with your doctor as told.  GET HELP RIGHT AWAY IF:  You, your family, or your friends notice that:  You have bad headaches, or they get worse.   You have weakness, loss of feeling (numbness), or you feel off balance.   You keep throwing up (vomiting).   You feel tired or pass out (faint).   One black center of your eye (pupil) is larger than the other.   You twitch or shake (seize).   Your speech is not clear (slurred).   You are confused, restless, easily angered (agitated), or annoyed (irritable).   You cannot recognize or respond to people or activities.   You have neck pain.   You have trouble being woken up.   Your behavior changes.  MAKE SURE YOU:   Understand these instructions.   Will watch your condition.   Will get help right away if you are not doing well or get worse.  Document Released: 11/08/2009 Document Revised: 11/09/2011 Document Reviewed:  11/08/2009 [facility] Patient Information 2012 [place].Post-Concussion Syndrome Post-concussion syndrome means you have problems after a head injury. The problems can last for weeks or months. The problems usually go away on their own over time. HOME CARE   Only take medicines as told by your doctor. Do not take aspirin.   Sleep with your head raised (elevated) to help with headaches.   Avoid activities that can cause another head injury. Do not play football, hockey, do martial arts, or ride horses until your doctor says it is okay.   Keep all doctor visits as told.  GET HELP RIGHT AWAY IF:  You feel confused or very sleepy.   You cannot wake the injured person.   You feel sick to your stomach (nauseous) or keep throwing up (vomiting).   You feel like you are moving when you are not (vertigo).   You notice the injured person's eyes moving back and forth very fast.   You start shaking (convulsions) or pass out (faint).   You have very bad headaches that do not get better with medicine.   You cannot use your arms or legs normally.   The black center of your eyes (pupils) change size.   You have clear or bloody fluid coming from your nose or ears.   Your problems get worse, not better.  MAKE SURE  YOU:  Understand these instructions.   Will watch your condition.   Will get help right away if you are not doing well or get worse.  Document Released: 12/28/2004 Document Revised: 11/09/2011 Document Reviewed: 06/08/2011 [facility] Patient Information 2012 [place].

## 2012-03-13 DIAGNOSIS — S022XXA Fracture of nasal bones, initial encounter for closed fracture: Secondary | ICD-10-CM | POA: Diagnosis not present

## 2012-03-14 ENCOUNTER — Other Ambulatory Visit: Payer: Self-pay | Admitting: Family Medicine

## 2012-03-14 DIAGNOSIS — H00019 Hordeolum externum unspecified eye, unspecified eyelid: Secondary | ICD-10-CM | POA: Diagnosis not present

## 2012-03-14 NOTE — Telephone Encounter (Signed)
Last seen 01/17/12 and filled 02/21/12. Please advise     KP

## 2012-03-14 NOTE — Telephone Encounter (Signed)
Patient called & stated she fell on concrete on her face 1-1/2wks ago and is taking more then prescribed of this medication. She states the fall broke her nose & she has been & continues to be in pain, that is why she has requested the refill earlier than necessary. You can call patient at [phone]

## 2012-03-14 NOTE — Telephone Encounter (Signed)
Ok to refill x1

## 2012-03-23 DIAGNOSIS — L02419 Cutaneous abscess of limb, unspecified: Secondary | ICD-10-CM | POA: Diagnosis not present

## 2012-03-23 DIAGNOSIS — G5770 Causalgia of unspecified lower limb: Secondary | ICD-10-CM | POA: Diagnosis not present

## 2012-03-23 DIAGNOSIS — L03119 Cellulitis of unspecified part of limb: Secondary | ICD-10-CM | POA: Diagnosis not present

## 2012-03-24 ENCOUNTER — Ambulatory Visit
Admission: RE | Admit: 2012-03-24 | Discharge: 2012-03-24 | Disposition: A | Discharge status: Home / Self Care | Payer: Medicare Other | Source: Ambulatory Visit | Attending: Neurosurgery | Admitting: Neurosurgery

## 2012-03-24 DIAGNOSIS — M5124 Other intervertebral disc displacement, thoracic region: Secondary | ICD-10-CM | POA: Diagnosis not present

## 2012-03-24 DIAGNOSIS — IMO0002 Reserved for concepts with insufficient information to code with codable children: Secondary | ICD-10-CM

## 2012-03-24 DIAGNOSIS — R079 Chest pain, unspecified: Secondary | ICD-10-CM | POA: Diagnosis not present

## 2012-03-24 DIAGNOSIS — N644 Mastodynia: Secondary | ICD-10-CM | POA: Diagnosis not present

## 2012-03-29 ENCOUNTER — Ambulatory Visit (INDEPENDENT_AMBULATORY_CARE_PROVIDER_SITE_OTHER): Payer: Medicare Other | Admitting: Internal Medicine

## 2012-03-29 VITALS — BP 140/70 | HR 69 | Temp 97.5°F | Wt 204.0 lb

## 2012-03-29 DIAGNOSIS — L0390 Cellulitis, unspecified: Secondary | ICD-10-CM | POA: Diagnosis not present

## 2012-03-29 DIAGNOSIS — R634 Abnormal weight loss: Secondary | ICD-10-CM | POA: Diagnosis not present

## 2012-03-29 DIAGNOSIS — L0291 Cutaneous abscess, unspecified: Secondary | ICD-10-CM

## 2012-03-29 DIAGNOSIS — M546 Pain in thoracic spine: Secondary | ICD-10-CM

## 2012-03-29 MED ORDER — DOXYCYCLINE HYCLATE 100 MG PO TABS: 100.0000 mg | ORAL_TABLET | Freq: Two times a day (BID) | ORAL | Status: AC

## 2012-03-29 NOTE — Progress Notes (Signed)
  Subjective:    Patient ID: Margaret Dyer, female    DOB: 30-Jul-1935, 77 y.o.   MRN: [id number]  HPI Acute visit  Her main complain is pain for the past 2 months. The pain starts at the left back, radiates anteriorly to the chest from and from there it  encompasses the anterior L chest and L breast. The pain is steady, ibuprofen does not help, decrease w/ ultram-Vicodin. No change w/ moving her torso She actually has  seen a neurosurgeon, they Rx a MRI a few days ago suspecting she has a problem in the thoracic spine. Also, she went to the emergency room 03/05/2012 because a fall. After the fall, the  pain of the thoracic spine got  worse and the breast L was tender.  Incidentally she was diagnosed with a staff infection at the urgent care a few days ago, status post 6 days of antibiotics and she sees only a mild improvement.  Extensive chart review 03/05/2012: Chest x-ray negative, CT of the cervical spine showed DJD. EKG no acute finding   MRI 03-24-12 1. No compression fracture or acute osseous abnormality.  2. Mild thoracic spondylosis with disc herniations at T6-T7, T7-  T8, T8-T9, and T9-T10. Mild central stenosis with mild cord  deformity associated with protrusions.   Past Medical History  Diagnosis Date  . Asthma   . Diabetes mellitus   . Osteopenia   . Renal cell carcinoma   . Goiter   . Hypertension   . Hyperlipidemia    Past Surgical History  Procedure Date  . Nephrectomy      Review of Systems  denies fever or chills No cough or shortness of breath No rash on the chest. No lower extremity edema. She does admit to weight loss.10 pounds in 2 months      Objective:   Physical Exam General -- alert, well-developed. No apparent distress.  Neck --no LADs, no supraclavicular LADs  Lungs -- normal respiratory effort, no intercostal retractions, no accessory muscle use, and normal breath sounds.   Breast-- very tender to touch B, no mass, skin w/o lesions, no  axilary LADs Heart-- normal rate, regular rhythm, no murmur, and no gallop.   Abdomen--soft, non-tender, no distention, no masses, no HSM, no guarding, and no rigidity.   Extremities-- no pretibial edema bilaterally , 5x2 area of erythema L pretibial area  Neurologic-- alert & oriented X3 and strength normal in all extremities. DTRs and stenght symmetric  Psych-- Cognition and judgment appear intact. Alert and cooperative with normal attention span and concentration.  not anxious appearing and not depressed appearing.        Assessment & Plan:   thoracic and chest pain. Clinically, the pain is likely musculoskeletal, I recommend her to continue taking Ultram or Vicodin as needed and to discuss with neurosurgery, the MRI did show some disc problems from T6-T10 recognizing that the pain is rather in a T3, T4 distribution. On the other hand, the patient has severe pain, she has a history of renal cancer, she is loosing weight. She thinks related to lack of appetite due to pain. Plan:  CBC, TSH, recommend to see PCP in 3 weeks. CT chest?  Cellulitis Improving, will Rx 4 additional days of doxys.  Diabetes, poorly controlled, encourage her to talk with her endocrinologist.

## 2012-03-29 NOTE — Patient Instructions (Signed)
Please schedule a visit to see your primary doctor in 3 weeks.

## 2012-03-30 LAB — CBC WITH DIFFERENTIAL/PLATELET
Basophils Absolute: 0.1 10*3/uL (ref 0.0–0.1)
Eosinophils Absolute: 0.1 10*3/uL (ref 0.0–0.7)
Eosinophils Relative: 2 % (ref 0–5)
HCT: 49 % — ABNORMAL HIGH (ref 36.0–46.0)
Lymphocytes Relative: 29 % (ref 12–46)
Lymphs Abs: 2.5 10*3/uL (ref 0.7–4.0)
MCH: 30.4 pg (ref 26.0–34.0)
MCV: 90.2 fL (ref 78.0–100.0)
Monocytes Absolute: 0.7 10*3/uL (ref 0.1–1.0)
Platelets: 156 10*3/uL (ref 150–400)
RDW: 13.9 % (ref 11.5–15.5)

## 2012-04-01 ENCOUNTER — Other Ambulatory Visit: Payer: Medicare Other

## 2012-04-01 ENCOUNTER — Encounter: Payer: Self-pay | Admitting: Internal Medicine

## 2012-04-01 DIAGNOSIS — M5124 Other intervertebral disc displacement, thoracic region: Secondary | ICD-10-CM | POA: Diagnosis not present

## 2012-04-02 ENCOUNTER — Encounter: Payer: Self-pay | Admitting: *Deleted

## 2012-04-17 ENCOUNTER — Other Ambulatory Visit: Payer: Self-pay | Admitting: Family Medicine

## 2012-04-17 NOTE — Telephone Encounter (Signed)
Last seen 01/17/12 and filled 03/14/12 # 60. Please advise    KP

## 2012-04-26 ENCOUNTER — Encounter: Payer: Self-pay | Admitting: Family Medicine

## 2012-04-26 ENCOUNTER — Ambulatory Visit (INDEPENDENT_AMBULATORY_CARE_PROVIDER_SITE_OTHER): Payer: Medicare Other | Admitting: Family Medicine

## 2012-04-26 VITALS — BP 120/68 | HR 70 | Temp 98.3°F | Wt 203.0 lb

## 2012-04-26 DIAGNOSIS — N644 Mastodynia: Secondary | ICD-10-CM | POA: Diagnosis not present

## 2012-04-26 MED ORDER — LORAZEPAM 0.5 MG PO TABS: 0.5000 mg | ORAL_TABLET | Freq: Two times a day (BID) | ORAL | Status: AC | PRN

## 2012-04-26 MED ORDER — TRAMADOL HCL 50 MG PO TABS
50.0000 mg | ORAL_TABLET | Freq: Four times a day (QID) | ORAL | Status: DC | PRN
Start: 2012-04-26 — End: 2012-07-03

## 2012-04-26 NOTE — Progress Notes (Signed)
Addended by: Rosalita Chessman on: 04/26/2012 12:27 PM   Modules accepted: Orders

## 2012-04-26 NOTE — Assessment & Plan Note (Signed)
Agree with NS but will get diagnostic mammo to be sure

## 2012-04-26 NOTE — Progress Notes (Deleted)
  Subjective:    Patient ID: Margaret Dyer, female    DOB: [birth year], 77 y.o.   MRN: [id number]  HPI Pt here c/o L breast pain.  Dr Vertell Limber feels it is from thoracic spine and would like her to have nerve block.  She has been having trouble getting in touch with pain management. Pt is concerned about breast secondary to new fam hx breast ca.     Review of Systems As above    Objective:   Physical Exam  Constitutional: She appears well-developed and well-nourished.  Cardiovascular: Normal rate and regular rhythm.   Pulmonary/Chest: Effort normal and breath sounds normal.  Genitourinary: There is breast tenderness. No breast swelling, discharge or bleeding. Pelvic exam was performed with patient supine.          Assessment & Plan:

## 2012-04-26 NOTE — Progress Notes (Signed)
  Subjective:    Patient ID: Margaret Dyer, female    DOB: February 21, 1935, 77 y.o.   MRN: [id number]  HPI  Pt here c/o L breast pain.  Dr Vertell Limber feels it is coming from her T spine and wants her to have a nerve block done.  She is concerned about her breast secondary to new fam hx of breast ca. Screening mammo in jan normal. Review of Systems As above    Objective:   Physical Exam  Constitutional: She is oriented to person, place, and time. She appears well-developed and well-nourished.  Genitourinary: There is breast tenderness. No breast swelling, discharge or bleeding.  Neurological: She is alert and oriented to person, place, and time.  Psychiatric: She has a normal mood and affect. Her behavior is normal. Judgment and thought content normal.          Assessment & Plan:

## 2012-05-02 DIAGNOSIS — N644 Mastodynia: Secondary | ICD-10-CM | POA: Diagnosis not present

## 2012-05-02 DIAGNOSIS — N63 Unspecified lump in unspecified breast: Secondary | ICD-10-CM | POA: Diagnosis not present

## 2012-05-12 ENCOUNTER — Emergency Department (HOSPITAL_BASED_OUTPATIENT_CLINIC_OR_DEPARTMENT_OTHER)
Admission: EM | Admit: 2012-05-12 | Discharge: 2012-05-12 | Disposition: A | Discharge status: Home / Self Care | Payer: No Typology Code available for payment source | Attending: Emergency Medicine | Admitting: Emergency Medicine

## 2012-05-12 ENCOUNTER — Encounter (HOSPITAL_BASED_OUTPATIENT_CLINIC_OR_DEPARTMENT_OTHER): Payer: Self-pay | Admitting: *Deleted

## 2012-05-12 ENCOUNTER — Emergency Department (HOSPITAL_BASED_OUTPATIENT_CLINIC_OR_DEPARTMENT_OTHER): Payer: No Typology Code available for payment source

## 2012-05-12 DIAGNOSIS — R51 Headache: Secondary | ICD-10-CM | POA: Insufficient documentation

## 2012-05-12 DIAGNOSIS — E119 Type 2 diabetes mellitus without complications: Secondary | ICD-10-CM | POA: Insufficient documentation

## 2012-05-12 DIAGNOSIS — Z794 Long term (current) use of insulin: Secondary | ICD-10-CM | POA: Insufficient documentation

## 2012-05-12 DIAGNOSIS — I10 Essential (primary) hypertension: Secondary | ICD-10-CM | POA: Insufficient documentation

## 2012-05-12 DIAGNOSIS — M546 Pain in thoracic spine: Secondary | ICD-10-CM | POA: Diagnosis not present

## 2012-05-12 DIAGNOSIS — T1490XA Injury, unspecified, initial encounter: Secondary | ICD-10-CM | POA: Insufficient documentation

## 2012-05-12 DIAGNOSIS — R42 Dizziness and giddiness: Secondary | ICD-10-CM | POA: Insufficient documentation

## 2012-05-12 DIAGNOSIS — M549 Dorsalgia, unspecified: Secondary | ICD-10-CM | POA: Insufficient documentation

## 2012-05-12 HISTORY — DX: Reserved for inherently not codable concepts without codable children: IMO0001

## 2012-05-12 HISTORY — DX: Encounter for other specified aftercare: Z51.89
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
## 2012-05-16 DIAGNOSIS — M546 Pain in thoracic spine: Secondary | ICD-10-CM | POA: Diagnosis not present

## 2012-05-16 DIAGNOSIS — IMO0002 Reserved for concepts with insufficient information to code with codable children: Secondary | ICD-10-CM | POA: Diagnosis not present

## 2012-05-30 ENCOUNTER — Encounter: Payer: Self-pay | Admitting: Family Medicine

## 2012-05-31 ENCOUNTER — Ambulatory Visit (INDEPENDENT_AMBULATORY_CARE_PROVIDER_SITE_OTHER): Payer: Medicare Other | Admitting: Family Medicine

## 2012-05-31 ENCOUNTER — Encounter: Payer: Self-pay | Admitting: Family Medicine

## 2012-05-31 VITALS — BP 134/82 | HR 73 | Temp 98.1°F | Wt 200.8 lb

## 2012-05-31 DIAGNOSIS — R21 Rash and other nonspecific skin eruption: Secondary | ICD-10-CM | POA: Diagnosis not present

## 2012-05-31 DIAGNOSIS — I10 Essential (primary) hypertension: Secondary | ICD-10-CM

## 2012-05-31 DIAGNOSIS — R079 Chest pain, unspecified: Secondary | ICD-10-CM | POA: Diagnosis not present

## 2012-05-31 DIAGNOSIS — K219 Gastro-esophageal reflux disease without esophagitis: Secondary | ICD-10-CM | POA: Diagnosis not present

## 2012-05-31 DIAGNOSIS — E785 Hyperlipidemia, unspecified: Secondary | ICD-10-CM | POA: Diagnosis not present

## 2012-05-31 LAB — CBC WITH DIFFERENTIAL/PLATELET
Basophils Absolute: 0 10*3/uL (ref 0.0–0.1)
Eosinophils Absolute: 0.1 10*3/uL (ref 0.0–0.7)
HCT: 47.3 % — ABNORMAL HIGH (ref 36.0–46.0)
Lymphs Abs: 1.1 10*3/uL (ref 0.7–4.0)
MCHC: 33.6 g/dL (ref 30.0–36.0)
Monocytes Relative: 9.8 % (ref 3.0–12.0)
Neutro Abs: 5.7 10*3/uL (ref 1.4–7.7)
Platelets: 120 10*3/uL — ABNORMAL LOW (ref 150.0–400.0)
RDW: 13.4 % (ref 11.5–14.6)

## 2012-05-31 LAB — HEPATIC FUNCTION PANEL
Albumin: 3.6 g/dL (ref 3.5–5.2)
Alkaline Phosphatase: 83 U/L (ref 39–117)
Total Protein: 6.8 g/dL (ref 6.0–8.3)

## 2012-05-31 LAB — LIPID PANEL
Cholesterol: 118 mg/dL (ref 0–200)
HDL: 41.2 mg/dL (ref >39.00)
Triglycerides: 139 mg/dL (ref 0.0–149.0)
VLDL: 27.8 mg/dL (ref 0.0–40.0)

## 2012-05-31 LAB — BASIC METABOLIC PANEL
BUN: 15 mg/dL (ref 6–23)
CO2: 29 mEq/L (ref 19–32)
Calcium: 9 mg/dL (ref 8.4–10.5)
GFR: 65.71 mL/min (ref >60.00)
Glucose, Bld: 277 mg/dL — ABNORMAL HIGH (ref 70–99)
Potassium: 3.6 mEq/L (ref 3.5–5.1)

## 2012-05-31 LAB — H. PYLORI ANTIBODY, IGG: H Pylori IgG: NEGATIVE

## 2012-05-31 MED ORDER — CLOTRIMAZOLE-BETAMETHASONE 1-0.05 % EX CREA: TOPICAL_CREAM | Freq: Two times a day (BID) | CUTANEOUS | Status: DC

## 2012-05-31 MED ORDER — GI COCKTAIL ~~LOC~~
30.0000 mL | Freq: Once | ORAL | Status: AC
  Administered 2012-05-31: 30 mL via ORAL

## 2012-05-31 NOTE — Patient Instructions (Signed)
Diet for GERD or PUD Nutrition therapy can help ease the discomfort of gastroesophageal reflux disease (GERD) and peptic ulcer disease (PUD).  HOME CARE INSTRUCTIONS   Eat your meals slowly, in a relaxed setting.   Eat 5 to 6 small meals per day.   If a food causes distress, stop eating it for a period of time.  FOODS TO AVOID  Coffee, regular or decaffeinated.   Cola beverages, regular or low calorie.   Tea, regular or decaffeinated.   Pepper.   Cocoa.   High fat foods, including meats.   Butter, margarine, hydrogenated oil (trans fats).   Peppermint or spearmint (if you have GERD).   Fruits and vegetables if not tolerated.   Alcohol.   Nicotine (smoking or chewing). This is one of the most potent stimulants to acid production in the gastrointestinal tract.   Any food that seems to aggravate your condition.  If you have questions regarding your diet, ask your caregiver or a registered dietitian. TIPS  Lying flat may make symptoms worse. Keep the head of your bed raised 6 to 9 inches (15 to 23 cm) by using a foam wedge or blocks under the legs of the bed.   Do not lay down until 3 hours after eating a meal.   Daily physical activity may help reduce symptoms.  MAKE SURE YOU:   Understand these instructions.   Will watch your condition.   Will get help right away if you are not doing well or get worse.  Document Released: 11/20/2005 Document Revised: 11/09/2011 Document Reviewed: 10/06/2011 [facility] Patient Information 2012 [place].

## 2012-05-31 NOTE — Progress Notes (Signed)
  Subjective:     Margaret Dyer is a 77 y.o. female who presents for evaluation of abdominal pain. Onset was several months ago. Symptoms have been gradually worsening. The pain is described as burning and cramping, and is 6/10 in intensity. Pain is located in the LUQ and epigastric region without radiation.  Aggravating factors: eating.  Alleviating factors: antacids, H2 blockers and proton pump inhibitors. Associated symptoms: anorexia. The patient denies arthralagias, belching, chills, constipation, diarrhea, dysuria, fever, flatus, frequency, headache, hematochezia, hematuria, melena, myalgias, nausea, sweats and vomiting.  The patient's history has been marked as reviewed and updated as appropriate.  Review of Systems Pertinent items are noted in HPI.     Objective:    BP 134/82  Pulse 73  Temp 98.1 F (36.7 C) (Oral)  Wt 200 lb 12.8 oz (91.082 kg)  SpO2 97% General appearance: alert, cooperative, appears stated age and no distress Head: Normocephalic, without obvious abnormality, atraumatic Ears: normal TM's and external ear canals both ears Nose: Nares normal. Septum midline. Mucosa normal. No drainage or sinus tenderness. Throat: lips, mucosa, and tongue normal; teeth and gums normal Neck: no adenopathy, no carotid bruit, no JVD, supple, symmetrical, trachea midline and thyroid not enlarged, symmetric, no tenderness/mass/nodules Lungs: clear to auscultation bilaterally Heart: S1, S2 normal Abdomen: abnormal findings:  moderate tenderness in the epigastrium and in the LUQ    Assessment:    Abdominal pain, likely secondary to gerd/ hiatal hernia .    Plan:    The diagnosis was discussed with the patient and evaluation and treatment plans outlined. See orders for lab and imaging studies. Further follow-up plans will be based on outcome of lab/imaging studies; see orders. refer to GI

## 2012-06-03 DIAGNOSIS — R1012 Left upper quadrant pain: Secondary | ICD-10-CM | POA: Diagnosis not present

## 2012-06-11 ENCOUNTER — Other Ambulatory Visit: Payer: Self-pay | Admitting: Family Medicine

## 2012-06-12 DIAGNOSIS — R1012 Left upper quadrant pain: Secondary | ICD-10-CM | POA: Diagnosis not present

## 2012-06-14 ENCOUNTER — Telehealth: Payer: Self-pay | Admitting: Family Medicine

## 2012-06-14 DIAGNOSIS — E1149 Type 2 diabetes mellitus with other diabetic neurological complication: Secondary | ICD-10-CM | POA: Diagnosis not present

## 2012-06-14 DIAGNOSIS — E785 Hyperlipidemia, unspecified: Secondary | ICD-10-CM | POA: Diagnosis not present

## 2012-06-14 DIAGNOSIS — E1165 Type 2 diabetes mellitus with hyperglycemia: Secondary | ICD-10-CM | POA: Diagnosis not present

## 2012-06-14 DIAGNOSIS — I10 Essential (primary) hypertension: Secondary | ICD-10-CM | POA: Diagnosis not present

## 2012-06-14 DIAGNOSIS — E118 Type 2 diabetes mellitus with unspecified complications: Secondary | ICD-10-CM | POA: Diagnosis not present

## 2012-06-14 DIAGNOSIS — E039 Hypothyroidism, unspecified: Secondary | ICD-10-CM | POA: Diagnosis not present

## 2012-06-14 NOTE — Telephone Encounter (Signed)
Please advise     KP

## 2012-06-17 ENCOUNTER — Other Ambulatory Visit: Payer: Self-pay | Admitting: Family Medicine

## 2012-06-17 DIAGNOSIS — R0781 Pleurodynia: Secondary | ICD-10-CM

## 2012-06-17 NOTE — Telephone Encounter (Signed)
Bone scan ---  L rib pain

## 2012-06-18 ENCOUNTER — Other Ambulatory Visit: Payer: Self-pay | Admitting: Family Medicine

## 2012-06-18 DIAGNOSIS — R0781 Pleurodynia: Secondary | ICD-10-CM

## 2012-06-20 ENCOUNTER — Inpatient Hospital Stay (HOSPITAL_COMMUNITY): Admission: RE | Admit: 2012-06-20 | Payer: Medicare Other | Source: Ambulatory Visit

## 2012-06-20 ENCOUNTER — Encounter (HOSPITAL_COMMUNITY): Payer: Medicare Other

## 2012-06-24 ENCOUNTER — Encounter (HOSPITAL_COMMUNITY): Payer: Medicare Other

## 2012-06-24 ENCOUNTER — Inpatient Hospital Stay (HOSPITAL_COMMUNITY): Admission: RE | Admit: 2012-06-24 | Payer: Medicare Other | Source: Ambulatory Visit

## 2012-06-24 ENCOUNTER — Ambulatory Visit (HOSPITAL_COMMUNITY): Admission: RE | Admit: 2012-06-24 | Payer: Medicare Other | Source: Ambulatory Visit

## 2012-06-24 ENCOUNTER — Encounter (HOSPITAL_COMMUNITY)
Admission: RE | Admit: 2012-06-24 | Discharge: 2012-06-24 | Disposition: A | Discharge status: Home / Self Care | Payer: Medicare Other | Source: Ambulatory Visit | Attending: Family Medicine | Admitting: Family Medicine

## 2012-06-24 ENCOUNTER — Other Ambulatory Visit: Payer: Self-pay | Admitting: Family Medicine

## 2012-06-24 DIAGNOSIS — R079 Chest pain, unspecified: Secondary | ICD-10-CM | POA: Insufficient documentation

## 2012-06-24 DIAGNOSIS — M545 Low back pain, unspecified: Secondary | ICD-10-CM | POA: Insufficient documentation

## 2012-06-24 DIAGNOSIS — C649 Malignant neoplasm of unspecified kidney, except renal pelvis: Secondary | ICD-10-CM | POA: Diagnosis not present

## 2012-06-24 DIAGNOSIS — R0781 Pleurodynia: Secondary | ICD-10-CM

## 2012-06-24 DIAGNOSIS — Z9181 History of falling: Secondary | ICD-10-CM | POA: Insufficient documentation

## 2012-06-24 MED ORDER — TECHNETIUM TC 99M MEDRONATE IV KIT
25.0000 | PACK | Freq: Once | INTRAVENOUS | Status: AC | PRN
  Administered 2012-06-24: 25 via INTRAVENOUS

## 2012-06-28 ENCOUNTER — Encounter: Payer: Self-pay | Admitting: Family Medicine

## 2012-07-03 ENCOUNTER — Encounter (HOSPITAL_COMMUNITY): Payer: Self-pay | Admitting: Emergency Medicine

## 2012-07-03 ENCOUNTER — Emergency Department (HOSPITAL_COMMUNITY)
Admission: EM | Admit: 2012-07-03 | Discharge: 2012-07-04 | Disposition: A | Discharge status: Home / Self Care | Payer: Medicare Other | Attending: Emergency Medicine | Admitting: Emergency Medicine

## 2012-07-03 DIAGNOSIS — E119 Type 2 diabetes mellitus without complications: Secondary | ICD-10-CM | POA: Insufficient documentation

## 2012-07-03 DIAGNOSIS — Z85528 Personal history of other malignant neoplasm of kidney: Secondary | ICD-10-CM | POA: Insufficient documentation

## 2012-07-03 DIAGNOSIS — M949 Disorder of cartilage, unspecified: Secondary | ICD-10-CM | POA: Insufficient documentation

## 2012-07-03 DIAGNOSIS — M899 Disorder of bone, unspecified: Secondary | ICD-10-CM | POA: Insufficient documentation

## 2012-07-03 DIAGNOSIS — I10 Essential (primary) hypertension: Secondary | ICD-10-CM

## 2012-07-03 DIAGNOSIS — Z794 Long term (current) use of insulin: Secondary | ICD-10-CM | POA: Insufficient documentation

## 2012-07-03 DIAGNOSIS — R079 Chest pain, unspecified: Secondary | ICD-10-CM

## 2012-07-03 DIAGNOSIS — E785 Hyperlipidemia, unspecified: Secondary | ICD-10-CM | POA: Diagnosis not present

## 2012-07-03 DIAGNOSIS — Z79899 Other long term (current) drug therapy: Secondary | ICD-10-CM | POA: Insufficient documentation

## 2012-07-03 DIAGNOSIS — I517 Cardiomegaly: Secondary | ICD-10-CM | POA: Diagnosis not present

## 2012-07-03 DIAGNOSIS — R110 Nausea: Secondary | ICD-10-CM | POA: Diagnosis not present

## 2012-07-03 MED ORDER — ONDANSETRON HCL 4 MG/2ML IJ SOLN
INTRAMUSCULAR | Status: AC
  Administered 2012-07-03: 4 mg
  Filled 2012-07-03: qty 2

## 2012-07-03 NOTE — ED Notes (Signed)
Pt. Arrived by ems Pt. c/o of being htn. bp at home was 215/113 and was very diaphoretic. ems 140/80. 20G IV left hand. 41m Zofran administered by EMS. Left sided breast pain has been present for several months that radiates to her back. States tonight her pain is different. Denies SOB. Pt. Took 324 Asprin prior to EMS arrival.

## 2012-07-04 ENCOUNTER — Emergency Department (HOSPITAL_COMMUNITY): Payer: Medicare Other

## 2012-07-04 ENCOUNTER — Telehealth: Payer: Self-pay | Admitting: Family Medicine

## 2012-07-04 DIAGNOSIS — I517 Cardiomegaly: Secondary | ICD-10-CM | POA: Diagnosis not present

## 2012-07-04 DIAGNOSIS — R079 Chest pain, unspecified: Secondary | ICD-10-CM | POA: Diagnosis not present

## 2012-07-04 LAB — CBC WITH DIFFERENTIAL/PLATELET
Basophils Relative: 1 % (ref 0–1)
Eosinophils Absolute: 0.1 10*3/uL (ref 0.0–0.7)
HCT: 42.7 % (ref 36.0–46.0)
Hemoglobin: 14.7 g/dL (ref 12.0–15.0)
Lymphs Abs: 1.7 10*3/uL (ref 0.7–4.0)
MCH: 30.1 pg (ref 26.0–34.0)
MCHC: 34.4 g/dL (ref 30.0–36.0)
MCV: 87.3 fL (ref 78.0–100.0)
Monocytes Absolute: 0.6 10*3/uL (ref 0.1–1.0)
Monocytes Relative: 9 % (ref 3–12)
Neutrophils Relative %: 62 % (ref 43–77)
RBC: 4.89 MIL/uL (ref 3.87–5.11)

## 2012-07-04 LAB — BASIC METABOLIC PANEL
BUN: 17 mg/dL (ref 6–23)
Creatinine, Ser: 0.76 mg/dL (ref 0.50–1.10)
GFR calc non Af Amer: 80 mL/min — ABNORMAL LOW (ref >90)
Glucose, Bld: 226 mg/dL — ABNORMAL HIGH (ref 70–99)
Potassium: 4 mEq/L (ref 3.5–5.1)

## 2012-07-04 MED ORDER — TRAMADOL HCL 50 MG PO TABS: 100.0000 mg | ORAL_TABLET | Freq: Two times a day (BID) | ORAL | Status: DC

## 2012-07-04 NOTE — ED Provider Notes (Signed)
History     CSN: [id number]  Arrival date & time 07/03/12  2333   First MD Initiated Contact with Patient 07/03/12 2334      Chief Complaint  Patient presents with  . Hypertension    (Consider location/radiation/quality/duration/timing/severity/associated sxs/prior treatment) HPI Comments: Pt has hx of htn for which she is treated and had a brief episode of sweating, mild nausea and severe hypertension prior to arrival with a blood pressure as high as 215/113. The symptoms resolved fairly quickly and paramedics found the patient to have a blood pressure of 140/80 on arrival. Currently the patient complains only of left-sided chest pain which she states has been present for 6 months persistently, never leaves and is worse with palpation along her rib cage. She has been evaluated with imaging including endoscopy and bone scans without any definitive findings. She denies any shortness of breath, leg swelling, cough, fevers,, abdominal pain, back pain, dysuria or diarrhea. Denies missing Any medications  The history is provided by the patient and a relative.    Past Medical History  Diagnosis Date  . Asthma   . Diabetes mellitus   . Osteopenia   . Renal cell carcinoma   . Goiter   . Hypertension   . Hyperlipidemia   . Blood transfusion     Past Surgical History  Procedure Date  . Nephrectomy   . Appendectomy   . Abdominal hysterectomy   . Knee surgery     Family History  Problem Relation Age of Onset  . Cancer Sister     bladder  . Kidney cancer Father   . Cancer Father     renal  . Coronary artery disease    . Diabetes    . Hyperlipidemia    . Hypertension      History  Substance Use Topics  . Smoking status: Never Smoker   . Smokeless tobacco: Never Used  . Alcohol Use: No    OB History    Grav Para Term Preterm Abortions TAB SAB Ect Mult Living                  Review of Systems  All other systems reviewed and are negative.    Allergies    Cefuroxime axetil and Ciprofloxacin  Home Medications   Current Outpatient Rx  Name Route Sig Dispense Refill  . AMLODIPINE BESYLATE 10 MG PO TABS Oral Take 10 mg by mouth at bedtime.    . ASPIRIN 325 MG PO TBEC Oral Take 325 mg by mouth daily as needed. For pain    . EZETIMIBE-SIMVASTATIN 10-20 MG PO TABS Oral Take 1 tablet by mouth at bedtime.    . INSULIN GLARGINE 100 UNIT/ML [place] SOLN Subcutaneous Inject 80 Units into the skin every evening.     . INSULIN GLULISINE 100 UNIT/ML [place] SOLN Subcutaneous Inject 30 Units into the skin every morning.     Marland Kitchen LEVOTHYROXINE SODIUM 25 MCG PO TABS Oral Take 25 mcg by mouth daily.      Marland Kitchen LOSARTAN POTASSIUM 100 MG PO TABS  take 1 tablet by mouth once daily 30 tablet 2  . METOPROLOL TARTRATE 50 MG PO TABS Oral Take 100 mg by mouth daily.    Marland Kitchen PANTOPRAZOLE SODIUM 40 MG PO TBEC Oral Take 40 mg by mouth daily.      . SUCRALFATE 1 G PO TABS Oral Take 1 g by mouth 4 (four) times daily as needed. Prevents stomach upset    . TRAMADOL HCL  50 MG PO TABS Oral Take 100 mg by mouth every 6 (six) hours as needed. pain      BP 142/66  Pulse 68  Temp 97.8 F (36.6 C) (Oral)  Resp 16  SpO2 98%  Physical Exam  Nursing note and vitals reviewed. Constitutional: She appears well-developed and well-nourished. No distress.  HENT:  Head: Normocephalic and atraumatic.  Mouth/Throat: Oropharynx is clear and moist. No oropharyngeal exudate.  Eyes: Conjunctivae and EOM are normal. Pupils are equal, round, and reactive to light. Right eye exhibits no discharge. Left eye exhibits no discharge. No scleral icterus.  Neck: Normal range of motion. Neck supple. No JVD present. No thyromegaly present.  Cardiovascular: Normal rate, regular rhythm, normal heart sounds and intact distal pulses.  Exam reveals no gallop and no friction rub.   No murmur heard. Pulmonary/Chest: Effort normal and breath sounds normal. No respiratory distress. She has no wheezes. She has no rales. She  exhibits tenderness (TTP over the left anterior and lateral rib cage which the patient states reproduces her pain exactly).  Abdominal: Soft. Bowel sounds are normal. She exhibits no distension and no mass. There is no tenderness.  Musculoskeletal: Normal range of motion. She exhibits no edema and no tenderness.  Lymphadenopathy:    She has no cervical adenopathy.  Neurological: She is alert. Coordination normal.  Skin: Skin is warm and dry. No rash noted. No erythema.  Psychiatric: She has a normal mood and affect. Her behavior is normal.    ED Course  Procedures (including critical care time)  Labs Reviewed  CBC WITH DIFFERENTIAL - Abnormal; Notable for the following:    Platelets 107 (*)  PLATELET COUNT CONFIRMED BY SMEAR   All other components within normal limits  BASIC METABOLIC PANEL - Abnormal; Notable for the following:    Glucose, Bld 226 (*)     GFR calc non Af Amer 80 (*)     All other components within normal limits  TROPONIN I   Dg Chest 2 View  07/04/2012  *RADIOLOGY REPORT*  Clinical Data: Left side chest pain  CHEST - 2 VIEW  Comparison: 05/12/2012 thoracic spine radiograph, 03/05/2012 chest radiograph  Findings: Cardiomegaly.  Aortic arch atherosclerosis. No focal consolidation, pleural effusion, or pneumothorax.  No acute osseous finding.  Surgical clips posterior left upper quadrant.  IMPRESSION:  Cardiomegaly without focal consolidation.  Original Report Authenticated By: Suanne Marker, M.D.     1. Hypertension   2. Chest pain       MDM  VS normal at this time, no tachycardia, EKG with no ischemic findings it is completely unchanged from prior. Proceed with troponin, metabolic panel and CBC.  ED ECG REPORT  I personally interpreted this EKG   Date: 07/04/2012   Rate: 72  Rhythm: normal sinus rhythm  QRS Axis: left  Intervals: normal  ST/T Wave abnormalities: nonspecific T wave changes  Conduction Disutrbances:none  Narrative Interpretation:   Old  EKG Reviewed: unchanged compared with 01/04/2005   Labs negative, Pt has been asx since arrival and has normal BP at 130/60 at this time.  D/w pt re: f/u and she agrees to close f/u - will call in AM.  No meds indicated at this time - CP is chronic and reproducible with non ischemic ECG and neg trop.     Johnna Acosta, MD 07/04/12 [id number]

## 2012-07-04 NOTE — ED Notes (Signed)
Pt to xray

## 2012-07-04 NOTE — Telephone Encounter (Signed)
Last seen 05/21/12 and filed 04/26/12 # 60 with 1 refill. Please advise    KP

## 2012-07-04 NOTE — Telephone Encounter (Signed)
Pt states the pharmacy told her to contact us regarding her rx for tramadrol. Pt states she is now taking 2 pills in the morning and 2 at night. She would like enough for a 30 day supply sent to [facility].

## 2012-07-04 NOTE — Telephone Encounter (Signed)
#  [place] to call in x1

## 2012-07-11 ENCOUNTER — Other Ambulatory Visit: Payer: Self-pay | Admitting: Family Medicine

## 2012-07-11 NOTE — Telephone Encounter (Signed)
Last seen 05/31/12 and filled 04/26/12 # 30 with 1 refill. Please advise     KP

## 2012-07-17 ENCOUNTER — Telehealth: Payer: Self-pay

## 2012-07-17 NOTE — Telephone Encounter (Signed)
Dr.Bechini's nurse called and stated the patient is in a patient assistance program for her medications and they ordered protonix for the patient but they received the Norvasc 10 mg instead and they wanted to confirm the patient was taking this medicaiton. I made her aware the Norvasc 10 mg is on the patient's medication list and she should be taking it. She voiced understanding and said they were not going to release the medication until they checked with [place].     KP

## 2012-07-18 ENCOUNTER — Other Ambulatory Visit: Payer: Self-pay | Admitting: Gastroenterology

## 2012-07-18 DIAGNOSIS — R109 Unspecified abdominal pain: Secondary | ICD-10-CM

## 2012-07-23 ENCOUNTER — Ambulatory Visit
Admission: RE | Admit: 2012-07-23 | Discharge: 2012-07-23 | Disposition: A | Discharge status: Home / Self Care | Payer: Medicare Other | Source: Ambulatory Visit | Attending: Gastroenterology | Admitting: Gastroenterology

## 2012-07-23 DIAGNOSIS — R161 Splenomegaly, not elsewhere classified: Secondary | ICD-10-CM | POA: Diagnosis not present

## 2012-07-23 DIAGNOSIS — R109 Unspecified abdominal pain: Secondary | ICD-10-CM

## 2012-07-23 DIAGNOSIS — K7460 Unspecified cirrhosis of liver: Secondary | ICD-10-CM | POA: Diagnosis not present

## 2012-07-23 DIAGNOSIS — E278 Other specified disorders of adrenal gland: Secondary | ICD-10-CM | POA: Diagnosis not present

## 2012-07-24 DIAGNOSIS — M546 Pain in thoracic spine: Secondary | ICD-10-CM | POA: Diagnosis not present

## 2012-08-09 DIAGNOSIS — L988 Other specified disorders of the skin and subcutaneous tissue: Secondary | ICD-10-CM | POA: Diagnosis not present

## 2012-08-09 DIAGNOSIS — E119 Type 2 diabetes mellitus without complications: Secondary | ICD-10-CM | POA: Diagnosis not present

## 2012-08-09 DIAGNOSIS — L03039 Cellulitis of unspecified toe: Secondary | ICD-10-CM | POA: Diagnosis not present

## 2012-08-16 ENCOUNTER — Other Ambulatory Visit: Payer: Self-pay | Admitting: Family Medicine

## 2012-08-16 NOTE — Telephone Encounter (Signed)
pt called she is completely out would like filled today if possible advise protocol is 24-48hrs  Pt stated her back is hurting really bad

## 2012-08-16 NOTE — Telephone Encounter (Signed)
Last seen 05/31/12 and filled 07/04/12. Please advise     KP

## 2012-09-06 ENCOUNTER — Other Ambulatory Visit: Payer: Self-pay | Admitting: Family Medicine

## 2012-09-06 NOTE — Telephone Encounter (Signed)
last seen 05/31/12 and filled  08/16/12  #60. Please advise     KP

## 2012-09-11 DIAGNOSIS — M545 Low back pain, unspecified: Secondary | ICD-10-CM | POA: Diagnosis not present

## 2012-09-11 DIAGNOSIS — M546 Pain in thoracic spine: Secondary | ICD-10-CM | POA: Diagnosis not present

## 2012-09-12 ENCOUNTER — Other Ambulatory Visit: Payer: Self-pay | Admitting: Family Medicine

## 2012-09-13 DIAGNOSIS — R1032 Left lower quadrant pain: Secondary | ICD-10-CM | POA: Diagnosis not present

## 2012-09-13 DIAGNOSIS — R141 Gas pain: Secondary | ICD-10-CM | POA: Diagnosis not present

## 2012-09-13 DIAGNOSIS — R6881 Early satiety: Secondary | ICD-10-CM | POA: Diagnosis not present

## 2012-09-13 DIAGNOSIS — R143 Flatulence: Secondary | ICD-10-CM | POA: Diagnosis not present

## 2012-09-13 DIAGNOSIS — R1031 Right lower quadrant pain: Secondary | ICD-10-CM | POA: Diagnosis not present

## 2012-09-13 NOTE — Telephone Encounter (Signed)
Rx sent.    MW

## 2012-09-16 DIAGNOSIS — K7689 Other specified diseases of liver: Secondary | ICD-10-CM | POA: Diagnosis not present

## 2012-09-16 DIAGNOSIS — D696 Thrombocytopenia, unspecified: Secondary | ICD-10-CM | POA: Diagnosis not present

## 2012-09-16 DIAGNOSIS — K219 Gastro-esophageal reflux disease without esophagitis: Secondary | ICD-10-CM | POA: Diagnosis not present

## 2012-09-16 DIAGNOSIS — R933 Abnormal findings on diagnostic imaging of other parts of digestive tract: Secondary | ICD-10-CM | POA: Diagnosis not present

## 2012-09-16 DIAGNOSIS — Z79899 Other long term (current) drug therapy: Secondary | ICD-10-CM | POA: Diagnosis not present

## 2012-09-30 DIAGNOSIS — Z79899 Other long term (current) drug therapy: Secondary | ICD-10-CM | POA: Diagnosis not present

## 2012-09-30 DIAGNOSIS — K219 Gastro-esophageal reflux disease without esophagitis: Secondary | ICD-10-CM | POA: Diagnosis not present

## 2012-10-16 ENCOUNTER — Other Ambulatory Visit: Payer: Self-pay | Admitting: Family Medicine

## 2012-10-16 DIAGNOSIS — E119 Type 2 diabetes mellitus without complications: Secondary | ICD-10-CM | POA: Diagnosis not present

## 2012-10-16 DIAGNOSIS — H31009 Unspecified chorioretinal scars, unspecified eye: Secondary | ICD-10-CM | POA: Diagnosis not present

## 2012-10-16 NOTE — Telephone Encounter (Signed)
OV 05/31/12 Tramadol 09/06/12 #120, ATIVAN 08/16/12 #60 X 1    Plz advise      MW

## 2012-11-04 ENCOUNTER — Encounter: Payer: Self-pay | Admitting: Family

## 2012-11-04 ENCOUNTER — Ambulatory Visit (HOSPITAL_BASED_OUTPATIENT_CLINIC_OR_DEPARTMENT_OTHER)
Admission: RE | Admit: 2012-11-04 | Discharge: 2012-11-04 | Disposition: A | Discharge status: Home / Self Care | Payer: Medicare Other | Source: Ambulatory Visit | Attending: Family | Admitting: Family

## 2012-11-04 ENCOUNTER — Other Ambulatory Visit: Payer: Self-pay | Admitting: Family Medicine

## 2012-11-04 ENCOUNTER — Telehealth: Payer: Self-pay | Admitting: Family

## 2012-11-04 ENCOUNTER — Ambulatory Visit (INDEPENDENT_AMBULATORY_CARE_PROVIDER_SITE_OTHER): Payer: Medicare Other | Admitting: Family

## 2012-11-04 VITALS — BP 138/80 | HR 87 | Temp 98.7°F | Resp 16 | Wt 201.0 lb

## 2012-11-04 DIAGNOSIS — R059 Cough, unspecified: Secondary | ICD-10-CM

## 2012-11-04 DIAGNOSIS — I517 Cardiomegaly: Secondary | ICD-10-CM | POA: Diagnosis not present

## 2012-11-04 DIAGNOSIS — R05 Cough: Secondary | ICD-10-CM

## 2012-11-04 DIAGNOSIS — J40 Bronchitis, not specified as acute or chronic: Secondary | ICD-10-CM | POA: Diagnosis not present

## 2012-11-04 MED ORDER — AZITHROMYCIN 250 MG PO TABS: ORAL_TABLET | ORAL | Status: DC

## 2012-11-04 MED ORDER — ONDANSETRON HCL 4 MG PO TABS: 4.0000 mg | ORAL_TABLET | Freq: Three times a day (TID) | ORAL | Status: DC | PRN

## 2012-11-04 MED ORDER — GUAIFENESIN-CODEINE 100-10 MG/5ML PO SYRP: 5.0000 mL | ORAL_SOLUTION | Freq: Three times a day (TID) | ORAL | Status: DC | PRN

## 2012-11-04 NOTE — Telephone Encounter (Signed)
Reviewed CXR results with pt. Instructed her to start zpak. She verbalizes understanding.

## 2012-11-04 NOTE — Telephone Encounter (Signed)
Pt left message stating pharmacy did not have any refills of lorazepam on file for her. Per pharmacist at [facility], they did not receive authorization from 10/16/12 for #60 x 1 refill. Rx given verbally and pt notified.

## 2012-11-04 NOTE — Progress Notes (Signed)
Subjective:    Patient ID: Margaret Dyer, female    DOB: 1935-11-21, 77 y.o.   MRN: [id number]  HPI  Ms. Dibartolo is a 77 yr old female who presents today with chief complaint of cough. Symptoms started 4 days ago and are associated with SOB on exertion.  She reports associated wheeze and nasal drainage. Last night she had nausea without vomitting.   She reports that her husband/grandson/daughter have similar symptoms.  She denies associated fever.  She has tried aspirin only without improvement in her symptoms.   Review of Systems     Past Medical History  Diagnosis Date  . Asthma   . Diabetes mellitus   . Osteopenia   . Renal cell carcinoma   . Goiter   . Hypertension   . Hyperlipidemia   . Blood transfusion     History   Social History  . Marital Status: Married    Spouse Name: N/A    Number of Children: N/A  . Years of Education: N/A   Occupational History  . Not on file.   Social History Main Topics  . Smoking status: Never Smoker   . Smokeless tobacco: Never Used  . Alcohol Use: No  . Drug Use: No  . Sexually Active:    Other Topics Concern  . Not on file   Social History Narrative  . No narrative on file    Past Surgical History  Procedure Date  . Nephrectomy   . Appendectomy   . Abdominal hysterectomy   . Knee surgery     Family History  Problem Relation Age of Onset  . Cancer Sister     bladder  . Kidney cancer Father   . Cancer Father     renal  . Coronary artery disease    . Diabetes    . Hyperlipidemia    . Hypertension      Allergies  Allergen Reactions  . Cefuroxime Axetil     Upset stomach   . Ciprofloxacin     Upset stomach    Current Outpatient Prescriptions on File Prior to Visit  Medication Sig Dispense Refill  . amLODipine (NORVASC) 10 MG tablet Take 10 mg by mouth at bedtime.      Marland Kitchen aspirin 325 MG EC tablet Take 325 mg by mouth daily as needed. For pain      . ezetimibe-simvastatin (VYTORIN) 10-20 MG per tablet Take  1 tablet by mouth at bedtime.      . insulin glargine (LANTUS) 100 UNIT/ML injection Inject 80 Units into the skin every evening.       . insulin glulisine (APIDRA OPTICLIK) 100 UNIT/ML injection Inject 30 Units into the skin every morning.       Marland Kitchen levothyroxine (SYNTHROID, LEVOTHROID) 25 MCG tablet Take 25 mcg by mouth daily.        Marland Kitchen LORazepam (ATIVAN) 0.5 MG tablet take 1 tablet by mouth twice a day if needed  60 tablet  1  . losartan (COZAAR) 100 MG tablet take 1 tablet by mouth once daily  30 tablet  2  . metoprolol (LOPRESSOR) 50 MG tablet Take 100 mg by mouth daily.      . pantoprazole (PROTONIX) 40 MG tablet Take 40 mg by mouth daily.        . sucralfate (CARAFATE) 1 G tablet Take 1 g by mouth 4 (four) times daily as needed. Prevents stomach upset      . traMADol (ULTRAM) 50 MG tablet take 2  tablets by mouth twice a day if needed  90 tablet  0    BP 138/80  Pulse 87  Temp 98.7 F (37.1 C) (Oral)  Resp 16  Wt 201 lb 0.6 oz (91.191 kg)  SpO2 96%    Objective:   Physical Exam  Constitutional: She is oriented to person, place, and time. She appears well-developed and well-nourished. No distress.  HENT:  Head: Normocephalic and atraumatic.  Right Ear: Tympanic membrane and ear canal normal.  Left Ear: Tympanic membrane and ear canal normal.  Mouth/Throat: No oropharyngeal exudate, posterior oropharyngeal edema or posterior oropharyngeal erythema.  Eyes: No scleral icterus.  Cardiovascular: Normal rate and regular rhythm.   No murmur heard. Pulmonary/Chest: Effort normal and breath sounds normal. No respiratory distress. She has no wheezes. She has no rales. She exhibits no tenderness.  Musculoskeletal: She exhibits no edema.  Neurological: She is alert and oriented to person, place, and time.  Skin: Skin is warm and dry.  Psychiatric: She has a normal mood and affect. Her behavior is normal. Thought content normal.          Assessment & Plan:

## 2012-11-04 NOTE — Patient Instructions (Addendum)
Please complete your chest x-ray on the first floor. Call if symptoms worsen, or if no improvement in 2-3 days. Follow up with Dr. Etter Sjogren in 1 week.

## 2012-11-04 NOTE — Telephone Encounter (Signed)
Rx 10/16/12 #60  with 1 refill-- KP

## 2012-11-06 DIAGNOSIS — J40 Bronchitis, not specified as acute or chronic: Secondary | ICD-10-CM | POA: Insufficient documentation

## 2012-11-06 NOTE — Assessment & Plan Note (Signed)
CXR is performed today and is negative for pneumonia.  It does not bronchitic changes. Will rx with zithromax and add cheratussin for cough.

## 2012-11-18 DIAGNOSIS — M546 Pain in thoracic spine: Secondary | ICD-10-CM | POA: Diagnosis not present

## 2012-11-29 ENCOUNTER — Telehealth: Payer: Self-pay | Admitting: Family Medicine

## 2012-11-29 NOTE — Telephone Encounter (Signed)
Called [facility] was told that Dr. Etter Sjogren is not the original prescribing physician and the medication could not be refilled based on that. Attempted to contact pt, lmovm to return call to inform her.

## 2012-11-29 NOTE — Telephone Encounter (Signed)
[facility]- needs someone to call into the program the med Norvasc [phone] and  ID [id number] Deadline is Monday, pls call pt as soon as possible.

## 2012-12-02 NOTE — Telephone Encounter (Signed)
pt called and stated she will be out tomorrow would need samples advised per [facility] we are unable to refill meds as we are not original prescirber--made app for 1.2.13 at 2:45

## 2012-12-03 ENCOUNTER — Encounter: Payer: Self-pay | Admitting: Lab

## 2012-12-03 ENCOUNTER — Telehealth: Payer: Self-pay | Admitting: *Deleted

## 2012-12-03 NOTE — Telephone Encounter (Signed)
Pt left VM that she is getting the stomach virus again and would like to get a Rx called in for Zofran. Called Pt back Pt states that she is not currently vomiting but is feeling very nausea. Pt indicated that she borrow some Zofran from her daughter and it helped with the nausea. Pt advise to stay hydrated and can try OTC med's for nausea but if symptoms increase or worsen she will need to be seen at [facility] or ED.  Pt inform if any of the following symptoms occur immediate evaluation needed due to office being closed: she is unable to keep fluid down, severe abdominal pain, diarrhea, vomiting or fever.Pt notes that she has appt scheduled for Thursday and knows the routine on what she needs to do. Pt advise per our protocol will let her speak with [facility] for further recommendation on treatment. Call transfer to [facility].

## 2012-12-03 NOTE — Telephone Encounter (Signed)
Caller: Margaret Dyer/Patient; Phone: [phone]; Reason for Call: Call was transferred to Triage line. Pt stated she was transferred to speak with a nurse but she does not need a call back from the nurse.  Pt will come in for her appt on thursday or call back if needed.  Pt wanted to send message to office to make sure they are aware she is coming for her appt on thursday. Pt was offered several times to speak with triage nurse and pt denied each time. Pt was instructed to call back if sx's worsened.

## 2012-12-05 ENCOUNTER — Ambulatory Visit (INDEPENDENT_AMBULATORY_CARE_PROVIDER_SITE_OTHER): Payer: Medicare Other | Admitting: Family Medicine

## 2012-12-05 ENCOUNTER — Encounter: Payer: Self-pay | Admitting: Family Medicine

## 2012-12-05 VITALS — BP 136/70 | HR 74 | Temp 98.0°F | Wt 204.8 lb

## 2012-12-05 DIAGNOSIS — E119 Type 2 diabetes mellitus without complications: Secondary | ICD-10-CM

## 2012-12-05 DIAGNOSIS — Z23 Encounter for immunization: Secondary | ICD-10-CM | POA: Diagnosis not present

## 2012-12-05 DIAGNOSIS — F32A Depression, unspecified: Secondary | ICD-10-CM | POA: Insufficient documentation

## 2012-12-05 DIAGNOSIS — F411 Generalized anxiety disorder: Secondary | ICD-10-CM

## 2012-12-05 DIAGNOSIS — F329 Major depressive disorder, single episode, unspecified: Secondary | ICD-10-CM | POA: Diagnosis not present

## 2012-12-05 DIAGNOSIS — I10 Essential (primary) hypertension: Secondary | ICD-10-CM | POA: Diagnosis not present

## 2012-12-05 DIAGNOSIS — R110 Nausea: Secondary | ICD-10-CM

## 2012-12-05 DIAGNOSIS — R809 Proteinuria, unspecified: Secondary | ICD-10-CM

## 2012-12-05 DIAGNOSIS — F419 Anxiety disorder, unspecified: Secondary | ICD-10-CM

## 2012-12-05 DIAGNOSIS — F341 Dysthymic disorder: Secondary | ICD-10-CM

## 2012-12-05 MED ORDER — SERTRALINE HCL 50 MG PO TABS: 50.0000 mg | ORAL_TABLET | Freq: Every day | ORAL | Status: DC

## 2012-12-05 MED ORDER — AMLODIPINE BESYLATE 10 MG PO TABS: 10.0000 mg | ORAL_TABLET | Freq: Every day | ORAL | Status: DC

## 2012-12-05 MED ORDER — INSULIN GLULISINE 100 UNIT/ML ~~LOC~~ SOLN: SUBCUTANEOUS | Status: DC

## 2012-12-05 MED ORDER — ONDANSETRON HCL 4 MG PO TABS: 4.0000 mg | ORAL_TABLET | Freq: Three times a day (TID) | ORAL | Status: DC | PRN

## 2012-12-05 MED ORDER — ONDANSETRON 8 MG PO TBDP: 8.0000 mg | ORAL_TABLET | Freq: Three times a day (TID) | ORAL | Status: DC | PRN

## 2012-12-05 MED ORDER — LORAZEPAM 0.5 MG PO TABS: 0.5000 mg | ORAL_TABLET | Freq: Two times a day (BID) | ORAL | Status: DC | PRN

## 2012-12-05 NOTE — Patient Instructions (Addendum)
Depression, Adult Depression refers to feeling sad, low, down in the dumps, blue, gloomy, or empty. In general, there are two kinds of depression: 1. Depression that we all experience from time to time because of upsetting life experiences, including the loss of a job or the ending of a relationship (normal sadness or normal grief). This kind of depression is considered normal, is short lived, and resolves within a few days to 2 weeks. (Depression experienced after the loss of a loved one is called bereavement. Bereavement often lasts longer than 2 weeks but normally gets better with time.) 2. Clinical depression, which lasts longer than normal sadness or normal grief or interferes with your ability to function at home, at work, and in school. It also interferes with your personal relationships. It affects almost every aspect of your life. Clinical depression is an illness. Symptoms of depression also can be caused by conditions other than normal sadness and grief or clinical depression. Examples of these conditions are listed as follows:  Physical illness Some physical illnesses, including underactive thyroid gland (hypothyroidism), severe anemia, specific types of cancer, diabetes, uncontrolled seizures, heart and lung problems, strokes, and chronic pain are commonly associated with symptoms of depression.  Side effects of some prescription medicine In some people, certain types of prescription medicine can cause symptoms of depression.  Substance abuse Abuse of alcohol and illicit drugs can cause symptoms of depression. SYMPTOMS Symptoms of normal sadness and normal grief include the following:  Feeling sad or crying for short periods of time.  Not caring about anything (apathy).  Difficulty sleeping or sleeping too much.  No longer able to enjoy the things you used to enjoy.  Desire to be by oneself all the time (social isolation).  Lack of energy or motivation.  Difficulty  concentrating or remembering.  Change in appetite or weight.  Restlessness or agitation. Symptoms of clinical depression include the same symptoms of normal sadness or normal grief and also the following symptoms:  Feeling sad or crying all the time.  Feelings of guilt or worthlessness.  Feelings of hopelessness or helplessness.  Thoughts of suicide or the desire to harm yourself (suicidal ideation).  Loss of touch with reality (psychotic symptoms). Seeing or hearing things that are not real (hallucinations) or having false beliefs about your life or the people around you (delusions and paranoia). DIAGNOSIS  The diagnosis of clinical depression usually is based on the severity and duration of the symptoms. Your caregiver also will ask you questions about your medical history and substance use to find out if physical illness, use of prescription medicine, or substance abuse is causing your depression. Your caregiver also may order blood tests. TREATMENT  Typically, normal sadness and normal grief do not require treatment. However, sometimes antidepressant medicine is prescribed for bereavement to ease the depressive symptoms until they resolve. The treatment for clinical depression depends on the severity of your symptoms but typically includes antidepressant medicine, counseling with a mental health professional, or a combination of both. Your caregiver will help to determine what treatment is best for you. Depression caused by physical illness usually goes away with appropriate medical treatment of the illness. If prescription medicine is causing depression, talk with your caregiver about stopping the medicine, decreasing the dose, or substituting another medicine. Depression caused by abuse of alcohol or illicit drugs abuse goes away with abstinence from these substances. Some adults need professional help in order to stop drinking or using drugs. SEEK IMMEDIATE CARE IF:  You have  thoughts  about hurting yourself or others.  You lose touch with reality (have psychotic symptoms).  You are taking medicine for depression and have a serious side effect. FOR MORE INFORMATION National Alliance on Mental Illness: www.nami.[employer] of Mental Health: https://carter.com/ Document Released: 11/17/2000 Document Revised: 05/21/2012 Document Reviewed: 02/19/2012 [facility] Patient Information 2013 [place].   Take zoloft 50 mg  1/2 tab daily for 8 days then increase to 1 tab daily  Increase apidra to 40 u in am and 10 u before dinner

## 2012-12-05 NOTE — Progress Notes (Signed)
  Subjective:    Patient ID: Margaret Dyer, female    DOB: 08/08/1935, 78 y.o.   MRN: [id number]  HPI Pt here or f/u and refills.  Pt states she is also having trouble with depression and her children asked her to talk to me about it.  She gets irritated easily.     Review of Systems As above    Objective:   Physical Exam  BP 136/70  Pulse 74  Temp 98 F (36.7 C) (Oral)  Wt 204 lb 12.8 oz (92.897 kg)  SpO2 97% General appearance: alert, cooperative, appears stated age and no distress Neurologic: Mental status: Alert, oriented, thought content appropriate Psych-- pt is not suicidal,  Under a lot of stress----Grandson living in the house with untreated bipolar..       Assessment & Plan:

## 2012-12-05 NOTE — Assessment & Plan Note (Signed)
zoloft 50 mg 1/2 tab po for 8 days then inc to 1 po qd Xanax rto 4-6 weeks or sooner prn

## 2012-12-05 NOTE — Assessment & Plan Note (Signed)
Refill meds stable

## 2012-12-18 ENCOUNTER — Other Ambulatory Visit: Payer: Self-pay | Admitting: Family Medicine

## 2013-01-06 ENCOUNTER — Telehealth: Payer: Self-pay | Admitting: Family Medicine

## 2013-01-06 NOTE — Telephone Encounter (Signed)
Patient states she needs new rx for hydrocodone-acetaminophen 10-325 mg for her back pain. She uses [facility] on [address].

## 2013-01-06 NOTE — Telephone Encounter (Signed)
agree

## 2013-01-06 NOTE — Telephone Encounter (Signed)
Discussed with patient and Ortho normally fills the medication and she wanted to get Dr.Lowne to fill it because Ortho will not fill it without her coming in. I made the patient aware we have not filled it either and she would need to be seen here before she could get the medication filled. She voiced understanding and agreed. She said she would wait a few days and she would call back for an apt.       KP//CMA

## 2013-01-10 ENCOUNTER — Encounter: Payer: Self-pay | Admitting: Family Medicine

## 2013-01-10 ENCOUNTER — Ambulatory Visit (INDEPENDENT_AMBULATORY_CARE_PROVIDER_SITE_OTHER): Payer: Medicare Other | Admitting: Family Medicine

## 2013-01-10 VITALS — BP 136/62 | HR 83 | Temp 98.0°F | Wt 204.0 lb

## 2013-01-10 DIAGNOSIS — E538 Deficiency of other specified B group vitamins: Secondary | ICD-10-CM | POA: Diagnosis not present

## 2013-01-10 DIAGNOSIS — R5381 Other malaise: Secondary | ICD-10-CM

## 2013-01-10 DIAGNOSIS — M1990 Unspecified osteoarthritis, unspecified site: Secondary | ICD-10-CM | POA: Diagnosis not present

## 2013-01-10 DIAGNOSIS — F411 Generalized anxiety disorder: Secondary | ICD-10-CM

## 2013-01-10 DIAGNOSIS — R5383 Other fatigue: Secondary | ICD-10-CM | POA: Diagnosis not present

## 2013-01-10 DIAGNOSIS — F419 Anxiety disorder, unspecified: Secondary | ICD-10-CM

## 2013-01-10 DIAGNOSIS — IMO0001 Reserved for inherently not codable concepts without codable children: Secondary | ICD-10-CM | POA: Diagnosis not present

## 2013-01-10 DIAGNOSIS — M791 Myalgia, unspecified site: Secondary | ICD-10-CM

## 2013-01-10 DIAGNOSIS — J40 Bronchitis, not specified as acute or chronic: Secondary | ICD-10-CM | POA: Diagnosis not present

## 2013-01-10 DIAGNOSIS — I10 Essential (primary) hypertension: Secondary | ICD-10-CM

## 2013-01-10 MED ORDER — DOXYCYCLINE HYCLATE 100 MG PO CAPS: 100.0000 mg | ORAL_CAPSULE | Freq: Two times a day (BID) | ORAL | Status: DC

## 2013-01-10 MED ORDER — HYDROCODONE-ACETAMINOPHEN 10-325 MG PO TABS: 1.0000 | ORAL_TABLET | Freq: Three times a day (TID) | ORAL | Status: DC | PRN

## 2013-01-10 MED ORDER — GUAIFENESIN-CODEINE 100-10 MG/5ML PO SYRP
ORAL_SOLUTION | ORAL | Status: DC
Start: 2013-01-10 — End: 2013-05-26

## 2013-01-10 MED ORDER — LORAZEPAM 0.5 MG PO TABS: 0.5000 mg | ORAL_TABLET | Freq: Two times a day (BID) | ORAL | Status: DC | PRN

## 2013-01-10 MED ORDER — METOPROLOL TARTRATE 50 MG PO TABS: 100.0000 mg | ORAL_TABLET | Freq: Every day | ORAL | Status: DC

## 2013-01-10 NOTE — Patient Instructions (Addendum)
Osteoarthritis Osteoarthritis is the most common form of arthritis. It is redness, soreness, and swelling (inflammation) affecting the cartilage. Cartilage acts as a cushion, covering the ends of bones where they meet to form a joint. CAUSES  Over time, the cartilage begins to wear away. This causes bone to rub on bone. This produces pain and stiffness in the affected joints. Factors that contribute to this problem are:  Excessive body weight.  Age.  Overuse of joints. SYMPTOMS   People with osteoarthritis usually experience joint pain, swelling, or stiffness.  Over time, the joint may lose its normal shape.  Small deposits of bone (osteophytes) may grow on the edges of the joint.  Bits of bone or cartilage can break off and float inside the joint space. This may cause more pain and damage.  Osteoarthritis can lead to depression, anxiety, feelings of helplessness, and limitations on daily activities. The most commonly affected joints are in the:  Ends of the fingers.  Thumbs.  Neck.  Lower back.  Knees.  Hips. DIAGNOSIS  Diagnosis is mostly based on your symptoms and exam. Tests may be helpful, including:  X-rays of the affected joint.  A computerized magnetic scan (MRI).  Blood tests to rule out other types of arthritis.  Joint fluid tests. This involves using a needle to draw fluid from the joint and examining the fluid under a microscope. TREATMENT  Goals of treatment are to control pain, improve joint function, maintain a normal body weight, and maintain a healthy lifestyle. Treatment approaches may include:  A prescribed exercise program with rest and joint relief.  Weight control with nutritional education.  Pain relief techniques such as:  Properly applied heat and cold.  Electric pulses delivered to nerve endings under the skin (transcutaneous electrical nerve stimulation, TENS).  Massage.  Certain supplements. Ask your caregiver before using any  supplements, especially in combination with prescribed drugs.  Medicines to control pain, such as:  Acetaminophen.  Nonsteroidal anti-inflammatory drugs (NSAIDs), such as naproxen.  Narcotic or central-acting agents, such as tramadol. This drug carries a risk of addiction and is generally prescribed for short-term use.  Corticosteroids. These can be given orally or as injection. This is a short-term treatment, not recommended for routine use.  Surgery to reposition the bones and relieve pain (osteotomy) or to remove loose pieces of bone and cartilage. Joint replacement may be needed in advanced states of osteoarthritis. HOME CARE INSTRUCTIONS  Your caregiver can recommend specific types of exercise. These may include:  Strengthening exercises. These are done to strengthen the muscles that support joints affected by arthritis. They can be performed with weights or with exercise bands to add resistance.  Aerobic activities. These are exercises, such as brisk walking or low-impact aerobics, that get your heart pumping. They can help keep your lungs and circulatory system in shape.  Range-of-motion activities. These keep your joints limber.  Balance and agility exercises. These help you maintain daily living skills. Learning about your condition and being actively involved in your care will help improve the course of your osteoarthritis. SEEK MEDICAL CARE IF:   You feel hot or your skin turns red.  You develop a rash in addition to your joint pain.  You have an oral temperature above 102 F (38.9 C). [place] of Arthritis and Musculoskeletal and Skin Diseases: www.niams.[employer].es [employer] on Aging: http://kim-miller.com/ American College of Rheumatology: www.rheumatology.org Document Released: 11/20/2005 Document Revised: 02/12/2012 Document Reviewed: 03/03/2010 [facility] Patient Information 2013 [place].

## 2013-01-11 NOTE — Progress Notes (Signed)
  Subjective:    Margaret Dyer is a 78 y.o. female who presents for follow up of low back problems and b/l knee pain.  Current symptoms include: pain in b/l knees and low back with radiation down legs (burning and sharp in character; 6/10 in severity) and weakness in legs. Symptoms have not changed from the previous visit. Exacerbating factors identified by the patient are bending backwards, bending forwards, bending sideways, sitting, standing and walking.  The following portions of the patient's history were reviewed and updated as appropriate: allergies, current medications, past family history, past medical history, past social history, past surgical history and problem list.    Objective:    BP 136/62  Pulse 83  Temp(Src) 98 F (36.7 C) (Oral)  Wt 204 lb (92.534 kg)  BMI 36.15 kg/m2  SpO2 97% General appearance: alert, cooperative, appears stated age and no distress Extremities: extremities normal, atraumatic, no cyanosis or edema --+ crepitus in knees Neurologic:pain with walking, sitting.  + weakness in legs,   dtr =and b/l     Assessment:    Nonspecific acute low back pain oa knees    Plan:    Neurosurgeon distributed. Ice to affected area as needed for local pain relief. Heat to affected area as needed for local pain relief. see med orders  Pt has seen ortho/ pain management.  Pt does not want surgury. rto prn

## 2013-01-14 LAB — VITAMIN D 1,25 DIHYDROXY
Vitamin D2 1, 25 (OH)2: 38 pg/mL
Vitamin D3 1, 25 (OH)2: 33 pg/mL

## 2013-01-15 ENCOUNTER — Other Ambulatory Visit: Payer: Self-pay | Admitting: Family Medicine

## 2013-03-05 DIAGNOSIS — Z79899 Other long term (current) drug therapy: Secondary | ICD-10-CM | POA: Diagnosis not present

## 2013-03-05 DIAGNOSIS — R1011 Right upper quadrant pain: Secondary | ICD-10-CM | POA: Diagnosis not present

## 2013-03-05 DIAGNOSIS — R1012 Left upper quadrant pain: Secondary | ICD-10-CM | POA: Diagnosis not present

## 2013-03-05 DIAGNOSIS — K7689 Other specified diseases of liver: Secondary | ICD-10-CM | POA: Diagnosis not present

## 2013-03-17 ENCOUNTER — Ambulatory Visit
Admission: RE | Admit: 2013-03-17 | Discharge: 2013-03-17 | Disposition: A | Discharge status: Home / Self Care | Payer: Medicare Other | Source: Ambulatory Visit | Attending: Gastroenterology | Admitting: Gastroenterology

## 2013-03-17 ENCOUNTER — Other Ambulatory Visit: Payer: Self-pay | Admitting: Gastroenterology

## 2013-03-17 DIAGNOSIS — K7460 Unspecified cirrhosis of liver: Secondary | ICD-10-CM | POA: Diagnosis not present

## 2013-03-17 DIAGNOSIS — R1011 Right upper quadrant pain: Secondary | ICD-10-CM

## 2013-03-25 ENCOUNTER — Other Ambulatory Visit: Payer: Self-pay | Admitting: Family Medicine

## 2013-03-26 DIAGNOSIS — R1011 Right upper quadrant pain: Secondary | ICD-10-CM | POA: Diagnosis not present

## 2013-03-26 DIAGNOSIS — R634 Abnormal weight loss: Secondary | ICD-10-CM | POA: Diagnosis not present

## 2013-03-26 NOTE — Telephone Encounter (Signed)
Last seen and filled 01/10/13 #30. Please advise      KP

## 2013-04-17 ENCOUNTER — Telehealth: Payer: Self-pay | Admitting: Family Medicine

## 2013-04-17 NOTE — Telephone Encounter (Signed)
Last OV 01-10-13, last filled 03-25-13 #30

## 2013-04-17 NOTE — Telephone Encounter (Signed)
This is new pain--- she would need to be evaluated ---last ov was knee pain

## 2013-04-17 NOTE — Telephone Encounter (Signed)
Pt called and wanted to see if dr Etter Sjogren could call her in a refill for HYDROcodone-acetaminophen (NORCO) 10-325 MG per tablet [[id number]]. Pt states that she is hurting under her breast and having back pain.

## 2013-04-18 ENCOUNTER — Encounter: Payer: Self-pay | Admitting: Lab

## 2013-04-18 NOTE — Telephone Encounter (Signed)
Discuss with patient, OV scheduled.

## 2013-04-21 ENCOUNTER — Encounter: Payer: Self-pay | Admitting: Family Medicine

## 2013-04-21 ENCOUNTER — Ambulatory Visit (INDEPENDENT_AMBULATORY_CARE_PROVIDER_SITE_OTHER): Payer: Medicare Other | Admitting: Family Medicine

## 2013-04-21 VITALS — BP 160/82 | HR 74 | Temp 98.5°F | Wt 198.0 lb

## 2013-04-21 DIAGNOSIS — E1159 Type 2 diabetes mellitus with other circulatory complications: Secondary | ICD-10-CM

## 2013-04-21 DIAGNOSIS — N644 Mastodynia: Secondary | ICD-10-CM | POA: Insufficient documentation

## 2013-04-21 MED ORDER — NONFORMULARY OR COMPOUNDED ITEM: Status: DC

## 2013-04-21 MED ORDER — HYDROCODONE-ACETAMINOPHEN 10-325 MG PO TABS: ORAL_TABLET | ORAL | Status: DC

## 2013-04-21 MED ORDER — SULFAMETHOXAZOLE-TMP DS 800-160 MG PO TABS: 1.0000 | ORAL_TABLET | Freq: Two times a day (BID) | ORAL | Status: DC

## 2013-04-21 MED ORDER — GI COCKTAIL ~~LOC~~
30.0000 mL | Freq: Once | ORAL | Status: AC
  Administered 2013-04-21: 30 mL via ORAL

## 2013-04-21 NOTE — Progress Notes (Signed)
  Subjective:    Patient ID: Margaret Dyer, female    DOB: 1935/05/23, 78 y.o.   MRN: [id number]  HPI  Pt here c/o R breast pain --- may be worsened with food.  Pt really not sure.  Just grabbing R breast.  No other symptoms.  Review of Systems As above    Objective:   Physical Exam  BP 160/82  Pulse 74  Temp(Src) 98.5 F (36.9 C) (Oral)  Wt 198 lb (89.812 kg)  BMI 35.08 kg/m2  SpO2 94% General appearance: alert, cooperative, appears stated age and no distress Throat: lips, mucosa, and tongue normal; teeth and gums normal Neck: no adenopathy, no carotid bruit, no JVD, supple, symmetrical, trachea midline and thyroid not enlarged, symmetric, no tenderness/mass/nodules Lungs: clear to auscultation bilaterally Breasts: normal appearance, no masses or tenderness, positive findings: + pain in r breast with deep pressure just under nipple. no masses Heart: S1, S2 normal Extremities: extremities normal, atraumatic, no cyanosis or edema      Assessment & Plan:

## 2013-04-21 NOTE — Patient Instructions (Addendum)
Breast Tenderness Breast tenderness is a common complaint made by women of all ages. It is also called mastalgia or mastodynia, which means breast pain. The condition can range from mild discomfort to severe pain. It has a variety of causes. Your caregiver will find out the likely cause of your breast tenderness by examining your breasts, asking you about symptoms and perhaps ordering some tests. Breast tenderness usually does not mean you have breast cancer. CAUSES  Breast tenderness has many possible causes. They include:  Premenstrual changes. A week to 10 days before your period, your breasts might ache or feel tender.  Other hormonal causes. These include:  When sexual and physical traits mature (puberty).  Pregnancy.  The time right before and the year after menopause (perimenopause).  The day when it has been 12 months since your last period (menopause).  Large breasts.  Infection (also called mastitis).  Birth control pills.  Breastfeeding. Tenderness can occur if the breasts are overfull with milk or if a milk duct is blocked.  Injury.  Fibrocystic breast changes. This is not cancer (benign). It causes painful breasts that feel lumpy.  Fluid-filled sacs (cysts). Often cysts can be drained in your healthcare provider's office.  Fibroadenoma. This is a tumor that is not cancerous.  Medication side effects. Blood pressure drugs and diuretics (which increase urine flow) sometimes cause breast tenderness.  Previous breast surgery, such as a breast reduction.  Breast cancer. Cancer is rarely the reason breasts are tender. In most women, tenderness is caused by something else. DIAGNOSIS  Several methods can be used to find out why your breasts are tender. They include:  Visual inspection of the breasts.  Examination by hand.  Tests, such as:  Mammogram.  Ultrasound.  Biopsy.  Lab test of any fluid coming from the nipple.  Blood tests.  MRI. TREATMENT    Treatment is directed to the cause of the breast tenderness from doing nothing for minor discomfort, wearing a good support bra but also may include:  Taking over-the-counter medicines for pain or discomfort as directed by your caregiver.  Prescription medicine for breast tenderness related to:  Premenstrual.  Fibrocystic.  Puberty.  Pregnancy.  Menopause.  Previous breast surgery.  Large breasts.  Antibiotics for infection.  Birth control pills for fibrocystic and premenstrual changes.  More frequent feedings or pumping of the breasts and warm compresses for breast engorgement when nursing.  Cold and warm compresses and a good support bra for most breast injuries.  Breast cysts are sometimes drained with a needle (aspiration) or removed with minor surgery.  Fibroadenomas are usually removed with minor surgery.  Changing or stopping the medicine when it is responsible for causing the breast tenderness.  When breast cancer is present with or without causing pain, it is usually treated with major surgery (with or without radiation) and chemotherapy. HOME CARE INSTRUCTIONS  Breast tenderness often can be handled at home. You can try:  Getting fitted for a new bra that provides more support, especially during exercise.  Wearing a more supportive or sports bra while sleeping when your breasts are very tender.  If you have a breast injury, using an ice pack for 15 to 20 minutes. Wrap the pack in a towel. Do not put the ice pack directly on your breast.  If your breasts are too full of milk as a result of breastfeeding, try:  Expressing milk either by hand or with a breast pump.  Applying a warm compress for relief.    over-the-counter pain relievers, if this is OK with your caregiver.  Taking medicine that your caregiver prescribes. These might include antibiotics or birth control pills. Over the long term, your breast tenderness might be eased if you:  Cut  down on caffeine.  Reduce the amount of fat in your diet. Also, learn how to do breast examinations at home. This will help you tell when you have an unusual growth or lump that could cause tenderness. And keep a log of the days and times when your breasts are most tender. This will help you and your caregiver find the right solution. SEEK MEDICAL CARE IF:   Any part of your breast is hard, red and hot to the touch. This could be a sign of infection.  Fluid is coming out of your nipples (and you are not breastfeeding). Especially watch for blood or pus.  You have a fever as well as breast tenderness.  You have a new or painful lump in your breast that remains after your period ends.  You have tried to take care of the pain at home, but it has not gone away.  Your breast pain is getting worse. Or, the pain is making it hard to do the things you usually do during your day. Document Released: 11/02/2008 Document Revised: 02/12/2012 Document Reviewed: 11/02/2008 [facility] Patient Information 2013 [place].

## 2013-04-21 NOTE — Assessment & Plan Note (Signed)
Diagnostic mammogram ? Infection--abx started

## 2013-05-08 DIAGNOSIS — N644 Mastodynia: Secondary | ICD-10-CM | POA: Diagnosis not present

## 2013-05-08 DIAGNOSIS — Z853 Personal history of malignant neoplasm of breast: Secondary | ICD-10-CM | POA: Diagnosis not present

## 2013-05-12 ENCOUNTER — Other Ambulatory Visit: Payer: Self-pay | Admitting: Family Medicine

## 2013-05-12 DIAGNOSIS — R7989 Other specified abnormal findings of blood chemistry: Secondary | ICD-10-CM

## 2013-05-13 ENCOUNTER — Telehealth: Payer: Self-pay

## 2013-05-13 DIAGNOSIS — R0789 Other chest pain: Secondary | ICD-10-CM

## 2013-05-13 NOTE — Telephone Encounter (Signed)
Message copied by Ewing Schlein on Tue May 13, 2013 11:18 AM ------      Message from: Rosalita Chessman      Created: Mon May 12, 2013  1:03 PM       Pt needs repeat cbcd---- labs received from [facility] abnormal ------

## 2013-05-13 NOTE — Telephone Encounter (Signed)
Also received report from the Mammogram and it recommends that we would need to complete an X-ray of the ribs. I discussed with the patient and she is still in a lot of pain, she is willing to get the Rib x-ray done, she also agreed to come by and have a repeat CBC-D. Copy of mammogram mailed to the patient     KP

## 2013-05-14 ENCOUNTER — Other Ambulatory Visit (INDEPENDENT_AMBULATORY_CARE_PROVIDER_SITE_OTHER): Payer: Medicare Other

## 2013-05-14 DIAGNOSIS — R7989 Other specified abnormal findings of blood chemistry: Secondary | ICD-10-CM

## 2013-05-14 LAB — CBC WITH DIFFERENTIAL/PLATELET
Basophils Relative: 0.4 % (ref 0.0–3.0)
Eosinophils Relative: 1.2 % (ref 0.0–5.0)
HCT: 46.8 % — ABNORMAL HIGH (ref 36.0–46.0)
Lymphs Abs: 1.5 10*3/uL (ref 0.7–4.0)
MCV: 88.7 fl (ref 78.0–100.0)
Monocytes Absolute: 0.6 10*3/uL (ref 0.1–1.0)
Monocytes Relative: 8.2 % (ref 3.0–12.0)
Neutrophils Relative %: 67.5 % (ref 43.0–77.0)
Platelets: 98 10*3/uL — ABNORMAL LOW (ref 150.0–400.0)
RBC: 5.28 Mil/uL — ABNORMAL HIGH (ref 3.87–5.11)
WBC: 6.7 10*3/uL (ref 4.5–10.5)

## 2013-05-16 ENCOUNTER — Telehealth: Payer: Self-pay | Admitting: Family Medicine

## 2013-05-16 DIAGNOSIS — R7989 Other specified abnormal findings of blood chemistry: Secondary | ICD-10-CM

## 2013-05-16 NOTE — Telephone Encounter (Signed)
Patient is calling for results on her labs that she had done.

## 2013-05-16 NOTE — Telephone Encounter (Signed)
msg left to call the office with husband      KP

## 2013-05-19 NOTE — Telephone Encounter (Signed)
Patient calling back about lab results.

## 2013-05-19 NOTE — Telephone Encounter (Signed)
hgb high with eagle but is better now.  Platelets dropped lower--- repeat it this week if doesn't come up we will refer to hematology

## 2013-05-19 NOTE — Telephone Encounter (Signed)
Discuss with patient, labs ordered Pt will call back to schedule appt.

## 2013-05-19 NOTE — Telephone Encounter (Signed)
msg left to call the office      KP

## 2013-05-19 NOTE — Telephone Encounter (Signed)
Patient came in for a repeat CBC because her previous CBC from [facility] was abnormal. She wanted to know what the difference is in the new CBC. Both results are on the ledge. Please advise    KP

## 2013-05-20 ENCOUNTER — Encounter: Payer: Self-pay | Admitting: Family Medicine

## 2013-05-22 ENCOUNTER — Other Ambulatory Visit: Payer: Self-pay | Admitting: Family Medicine

## 2013-05-23 ENCOUNTER — Ambulatory Visit
Admission: RE | Admit: 2013-05-23 | Discharge: 2013-05-23 | Disposition: A | Discharge status: Home / Self Care | Payer: Medicare Other | Source: Ambulatory Visit | Attending: Family Medicine | Admitting: Family Medicine

## 2013-05-23 DIAGNOSIS — R0789 Other chest pain: Secondary | ICD-10-CM

## 2013-05-23 DIAGNOSIS — R071 Chest pain on breathing: Secondary | ICD-10-CM | POA: Diagnosis not present

## 2013-05-23 NOTE — Telephone Encounter (Signed)
Last seen and filled 04/21/13 #30. Please advise     KP

## 2013-05-26 ENCOUNTER — Ambulatory Visit (INDEPENDENT_AMBULATORY_CARE_PROVIDER_SITE_OTHER): Payer: Medicare Other | Admitting: Family Medicine

## 2013-05-26 ENCOUNTER — Other Ambulatory Visit: Payer: Self-pay

## 2013-05-26 ENCOUNTER — Encounter: Payer: Self-pay | Admitting: Family Medicine

## 2013-05-26 ENCOUNTER — Other Ambulatory Visit: Payer: Medicare Other

## 2013-05-26 VITALS — BP 144/90 | HR 82 | Temp 97.6°F | Wt 192.8 lb

## 2013-05-26 DIAGNOSIS — D239 Other benign neoplasm of skin, unspecified: Secondary | ICD-10-CM | POA: Diagnosis not present

## 2013-05-26 DIAGNOSIS — M549 Dorsalgia, unspecified: Secondary | ICD-10-CM | POA: Insufficient documentation

## 2013-05-26 DIAGNOSIS — R7989 Other specified abnormal findings of blood chemistry: Secondary | ICD-10-CM | POA: Diagnosis not present

## 2013-05-26 DIAGNOSIS — D229 Melanocytic nevi, unspecified: Secondary | ICD-10-CM

## 2013-05-26 DIAGNOSIS — D696 Thrombocytopenia, unspecified: Secondary | ICD-10-CM | POA: Insufficient documentation

## 2013-05-26 MED ORDER — TRAMADOL HCL 50 MG PO TABS: ORAL_TABLET | ORAL | Status: DC

## 2013-05-26 NOTE — Assessment & Plan Note (Signed)
Per NS Referral coordinator spoke with Dr Donald Pore scheduler--- they actually did talk to her and renewed pain med.    Consider UDS or ask NS to do  Pt will f/u NS

## 2013-05-26 NOTE — Patient Instructions (Addendum)
I'm having Renee call Dr Vertell Limber about your appointment Please dont hesitate to call them as well. We will check your platelets today --if still low I will refer you to a hematologist.  Elbert Ewings in there!!!

## 2013-05-26 NOTE — Assessment & Plan Note (Signed)
Recheck labs Consider heme referral prn

## 2013-05-26 NOTE — Progress Notes (Signed)
  Subjective:    Patient ID: Margaret Dyer, female    DOB: 1935-11-01, 78 y.o.   MRN: [id number]  HPI Pt here f/u back / side pain.  Pt state NS feels it is coming from her back.  She states she has called NS for appointment and she doesn't call back.   Pt here also to have platelets repeated.      Review of Systems    as above  Objective:   Physical Exam BP 144/90  Pulse 82  Temp(Src) 97.6 F (36.4 C) (Oral)  Wt 192 lb 12.8 oz (87.454 kg)  BMI 34.16 kg/m2  SpO2 97% General appearance: alert, cooperative, appears stated age and no distress Back: + pain with flexion / ext Abdomen: soft, non-tender; bowel sounds normal; no masses,  no organomegaly Neurologic: Motor: normal  Reflexes: 2+ and symmetric Coordination: normal Gait: Antalgic2       Assessment & Plan:

## 2013-05-27 DIAGNOSIS — R7989 Other specified abnormal findings of blood chemistry: Secondary | ICD-10-CM

## 2013-05-27 LAB — CBC WITH DIFFERENTIAL/PLATELET
Basophils Relative: 0.8 % (ref 0.0–3.0)
Eosinophils Absolute: 0.1 10*3/uL (ref 0.0–0.7)
Eosinophils Relative: 1.4 % (ref 0.0–5.0)
HCT: 48.1 % — ABNORMAL HIGH (ref 36.0–46.0)
Lymphs Abs: 1.7 10*3/uL (ref 0.7–4.0)
MCHC: 33.3 g/dL (ref 30.0–36.0)
MCV: 90.5 fl (ref 78.0–100.0)
Monocytes Absolute: 0.8 10*3/uL (ref 0.1–1.0)
Neutrophils Relative %: 67.6 % (ref 43.0–77.0)
RBC: 5.32 Mil/uL — ABNORMAL HIGH (ref 3.87–5.11)
WBC: 8 10*3/uL (ref 4.5–10.5)

## 2013-05-29 DIAGNOSIS — Z79899 Other long term (current) drug therapy: Secondary | ICD-10-CM | POA: Diagnosis not present

## 2013-05-30 ENCOUNTER — Telehealth: Payer: Self-pay | Admitting: Hematology & Oncology

## 2013-05-30 ENCOUNTER — Other Ambulatory Visit: Payer: Self-pay | Admitting: Family Medicine

## 2013-05-30 NOTE — Telephone Encounter (Signed)
Left message on cell and with husband for pt to call and schedule appointment

## 2013-06-02 ENCOUNTER — Telehealth: Payer: Self-pay | Admitting: Hematology & Oncology

## 2013-06-02 NOTE — Telephone Encounter (Signed)
Called patient to give New Pt apt date/time, did not get an answer, so i left message on voice mail.

## 2013-06-02 NOTE — Telephone Encounter (Signed)
Last seen 05/26/13 and filled 01/10/13 #60 with 1 refill. Please advise     KP

## 2013-06-12 ENCOUNTER — Telehealth: Payer: Self-pay | Admitting: Hematology & Oncology

## 2013-06-12 NOTE — Telephone Encounter (Signed)
Pt moved 8-5 to 8-8 wants afternoon appointments

## 2013-06-26 DIAGNOSIS — E039 Hypothyroidism, unspecified: Secondary | ICD-10-CM | POA: Diagnosis not present

## 2013-06-26 DIAGNOSIS — G609 Hereditary and idiopathic neuropathy, unspecified: Secondary | ICD-10-CM | POA: Diagnosis not present

## 2013-06-26 DIAGNOSIS — E785 Hyperlipidemia, unspecified: Secondary | ICD-10-CM | POA: Diagnosis not present

## 2013-06-26 DIAGNOSIS — E1149 Type 2 diabetes mellitus with other diabetic neurological complication: Secondary | ICD-10-CM | POA: Diagnosis not present

## 2013-06-26 DIAGNOSIS — E1165 Type 2 diabetes mellitus with hyperglycemia: Secondary | ICD-10-CM | POA: Diagnosis not present

## 2013-07-08 ENCOUNTER — Ambulatory Visit: Payer: Medicare Other

## 2013-07-08 ENCOUNTER — Other Ambulatory Visit: Payer: Medicare Other | Admitting: Lab

## 2013-07-08 ENCOUNTER — Ambulatory Visit: Payer: Medicare Other | Admitting: Hematology & Oncology

## 2013-07-11 ENCOUNTER — Encounter: Payer: Self-pay | Admitting: Family Medicine

## 2013-07-11 ENCOUNTER — Ambulatory Visit (HOSPITAL_BASED_OUTPATIENT_CLINIC_OR_DEPARTMENT_OTHER): Payer: Medicare Other | Admitting: Hematology & Oncology

## 2013-07-11 ENCOUNTER — Ambulatory Visit (HOSPITAL_BASED_OUTPATIENT_CLINIC_OR_DEPARTMENT_OTHER): Payer: Medicare Other

## 2013-07-11 ENCOUNTER — Ambulatory Visit: Payer: Medicare Other | Admitting: Lab

## 2013-07-11 VITALS — BP 176/64 | HR 64 | Temp 98.6°F | Resp 16 | Ht 63.0 in | Wt 191.0 lb

## 2013-07-11 DIAGNOSIS — D696 Thrombocytopenia, unspecified: Secondary | ICD-10-CM | POA: Diagnosis not present

## 2013-07-11 DIAGNOSIS — E109 Type 1 diabetes mellitus without complications: Secondary | ICD-10-CM | POA: Diagnosis not present

## 2013-07-11 DIAGNOSIS — D731 Hypersplenism: Secondary | ICD-10-CM

## 2013-07-11 DIAGNOSIS — K7460 Unspecified cirrhosis of liver: Secondary | ICD-10-CM | POA: Diagnosis not present

## 2013-07-11 DIAGNOSIS — Z8553 Personal history of malignant neoplasm of renal pelvis: Secondary | ICD-10-CM

## 2013-07-11 DIAGNOSIS — M899 Disorder of bone, unspecified: Secondary | ICD-10-CM

## 2013-07-11 LAB — CBC WITH DIFFERENTIAL (CANCER CENTER ONLY)
BASO%: 0.4 % (ref 0.0–2.0)
EOS%: 1.1 % (ref 0.0–7.0)
LYMPH%: 23.2 % (ref 14.0–48.0)
MCH: 30.6 pg (ref 26.0–34.0)
MCHC: 34.9 g/dL (ref 32.0–36.0)
MCV: 88 fL (ref 81–101)
MONO%: 10.8 % (ref 0.0–13.0)
NEUT#: 5.5 10*3/uL (ref 1.5–6.5)
Platelets: 108 10*3/uL — ABNORMAL LOW (ref 145–400)
RDW: 13.3 % (ref 11.1–15.7)

## 2013-07-11 LAB — TECHNOLOGIST REVIEW CHCC SATELLITE

## 2013-07-11 LAB — CHCC SATELLITE - SMEAR

## 2013-07-11 NOTE — Progress Notes (Signed)
This office note has been dictated.

## 2013-07-12 NOTE — Progress Notes (Signed)
CC:   Margaret Chessman, DO  DIAGNOSIS:  Mild thrombocytopenia.  HISTORY OF PRESENT ILLNESS:  Margaret Dyer is a very nice 78 year old white female.  She is followed by Dr. Etter Sjogren.  She does have numerous medical problems.  She has insulin-dependent diabetes.  She has a history of a left nephrectomy for renal cell carcinoma.  This was back in 2000.  She also has had kidney stones.  She has hypertension.  She has gout.  She also apparently has cirrhosis. She does not drink.  She sees Dr. Cristina Gong.  Back in April of this year, an ultrasound of the abdomen was done.  This did show a cirrhotic liver. There was some splenomegaly.  Left kidney was absent.  An ultrasound done back in 2007 did show a fatty liver.  I think she was having some routine lab work done which showed some thrombocytopenia.  Back in June of 2013, her platelet count was 120,000.  White cell count was 7.7.  In June of 2014, CBC was done which showed a white cell count of 6.7; hemoglobin of 16; hematocrit of 47; platelet count 98,000.  Dr. Dion Saucier felt that a hematologic evaluation was indicated and kindly referred Margaret Dyer to the [facility] [employer].  Again, Margaret Dyer has not had any problems with bleeding.  She may have an occasional bruise but nothing that is widespread.  She has had no change in her medications.  She is not on any kind of blood thinner.  She is not a vegetarian.  I am not sure where this diagnosis of B12 deficiency came from.  I do not think she is on any type of B12 replacement.  PAST MEDICAL HISTORY:  Remarkable for 1. The left nephrectomy for renal cell carcinoma. 2. Hypertension. 3. Hyperlipidemia. 4. Hypothyroidism. 5. Asthma. 6. Insulin-dependent diabetes. 7. Osteopenia.  ALLERGIES:  Cephalosporins and floxins.  MEDICATIONS: 1. Aspirin 325 mg p.o. q. day. 2. Vytorin (10/20) 1 p.o. q. day. 3. Lantus insulin 55 units subcu q. day. 4. Synthroid 0.025 mg p.o. q. day. 5.  Ativan as needed. 6. Cozaar 100 mg p.o. q. day. 7. Lopressor 25 mg p.o. b.i.d. 8. Zofran ODT 8 mg p.o. q.12 hours p.r.n. 9. Protonix 40 mg p.o. q. day. 10.Carafate 1 g p.o. q.6 hours p.r.n. 11.Ultram 50 to 100 mg p.o. b.i.d. p.r.n. 12.Norvasc 10 mg p.o. q. day.  SOCIAL HISTORY:  Negative for tobacco use.  She, I think, may have smoked a while back.  She has no obvious occupational exposures.  There is no alcohol use.  FAMILY HISTORY:  Remarkable for multiple family members with cancer. Her father had esophageal cancer.  There is a history of, I think, a lung cancer.  There was also breast cancer.  REVIEW OF SYSTEMS:  As stated in the history of present illness.  No additional findings are noted.  PHYSICAL EXAM:  General:  This is a well-developed, well-nourished white female in no obvious distress.  Vital Signs:  Show a temperature of 98.6, pulse 64, respiratory rate 18, blood pressure 176/64.  Weight is 191.  Head and Neck:  Show a normocephalic, atraumatic skull.  There are no ocular or oral lesions.  There are no palpable cervical or supraclavicular lymph nodes.  Lungs:  Clear bilaterally.  Cardiac: Regular rate and rhythm with a normal S1 and S2.  There are no murmurs, rubs, or bruits.  Abdomen:  Soft.  She has good bowel sounds.  There is no fluid wave.  There is no guarding or rebound tenderness.  There is no palpable hepatosplenomegaly.  Extremities:  Show no clubbing, cyanosis, or edema.  She has osteoarthritic changes in her joints.  Skin:  Shows no rashes, ecchymoses, or petechiae.  Neurological:  Shows no focal neurological deficits.  LABORATORY STUDIES:  Show a white cell count of 8.5, hemoglobin 16, hematocrit 45.9.  Platelet count is 108.  MCV is 88.  Her peripheral smear shows a normochromic, normocytic population of red blood cells.  There are no nucleated red blood cells.  I see no rouleaux formation.  There are no schistocytes.  There are no spherocytes.   White cells appear of normal morphology maturation.  There are no hypersegmented polys.  I see no immature myeloid or lymphoid forms. There are no atypical lymphocytes.  There are no blasts.  Platelets are mildly decreased in number.  She has several large platelets.  Platelets appear well granulated.  IMPRESSION:  Margaret Dyer is a very charming 78 year old white female with thrombocytopenia.  I have to believe that this is going to be related to this cirrhosis and functional hypersplenism.  I do not see anything on her blood smear that would look as if she has a bone marrow problem.  As such, I do not see any indication for a bone marrow biopsy.  I am still not sure where this B12 deficiency diagnosis came from.  She is not on any kind of B12.  I do not see anything that would suggest B12 deficiency on her blood smear.  She is on a few medications.  I suppose some of the medications could be causing some thrombocytopenia.  I do not see any reason that we need to do a bone marrow on her.  I think the yield would be very, very low.  I think that we can probably get Margaret Dyer back in about 3 months' time. I really believe that her platelet count will fluctuate.  It is certainly possible that if this is related to this cirrhosis that her platelet count may gradually drift lower.  Again, she is asymptomatic.  I would not stop the aspirin.  Her platelet count is not low enough to make any changes in her medications.  I spent a good hour with Margaret Dyer and her friend.  It was nice to talk with Margaret Dyer.  She has a grandson who has severe ADHD.  This is worrying her quite a bit.  We will pray for him.    ______________________________ Volanda Napoleon, M.D. PRE/MEDQ  D:  07/11/2013  T:  07/12/2013  Job:  [id number]

## 2013-07-14 ENCOUNTER — Telehealth: Payer: Self-pay | Admitting: Hematology & Oncology

## 2013-07-14 NOTE — Telephone Encounter (Signed)
Mailed 11-7 schedule

## 2013-07-17 ENCOUNTER — Encounter (HOSPITAL_COMMUNITY): Payer: Self-pay | Admitting: *Deleted

## 2013-07-17 DIAGNOSIS — E785 Hyperlipidemia, unspecified: Secondary | ICD-10-CM | POA: Insufficient documentation

## 2013-07-17 DIAGNOSIS — E119 Type 2 diabetes mellitus without complications: Secondary | ICD-10-CM | POA: Insufficient documentation

## 2013-07-17 DIAGNOSIS — J45909 Unspecified asthma, uncomplicated: Secondary | ICD-10-CM | POA: Insufficient documentation

## 2013-07-17 DIAGNOSIS — Z8739 Personal history of other diseases of the musculoskeletal system and connective tissue: Secondary | ICD-10-CM | POA: Diagnosis not present

## 2013-07-17 DIAGNOSIS — R42 Dizziness and giddiness: Secondary | ICD-10-CM | POA: Diagnosis not present

## 2013-07-17 DIAGNOSIS — R6889 Other general symptoms and signs: Secondary | ICD-10-CM | POA: Diagnosis not present

## 2013-07-17 DIAGNOSIS — Z79899 Other long term (current) drug therapy: Secondary | ICD-10-CM | POA: Insufficient documentation

## 2013-07-17 DIAGNOSIS — Z794 Long term (current) use of insulin: Secondary | ICD-10-CM | POA: Diagnosis not present

## 2013-07-17 DIAGNOSIS — R110 Nausea: Secondary | ICD-10-CM | POA: Diagnosis not present

## 2013-07-17 DIAGNOSIS — Z87448 Personal history of other diseases of urinary system: Secondary | ICD-10-CM | POA: Insufficient documentation

## 2013-07-17 DIAGNOSIS — I10 Essential (primary) hypertension: Secondary | ICD-10-CM | POA: Diagnosis not present

## 2013-07-17 DIAGNOSIS — E049 Nontoxic goiter, unspecified: Secondary | ICD-10-CM | POA: Insufficient documentation

## 2013-07-17 DIAGNOSIS — R51 Headache: Secondary | ICD-10-CM | POA: Insufficient documentation

## 2013-07-17 NOTE — ED Notes (Signed)
The pt has had high bp and a headache since 1700.  She has been taking her bp meds everyother day..  nausea

## 2013-07-18 ENCOUNTER — Emergency Department (HOSPITAL_COMMUNITY)
Admission: EM | Admit: 2013-07-18 | Discharge: 2013-07-18 | Disposition: A | Discharge status: Home / Self Care | Payer: Medicare Other | Attending: Emergency Medicine | Admitting: Emergency Medicine

## 2013-07-18 ENCOUNTER — Emergency Department (HOSPITAL_COMMUNITY): Payer: Medicare Other

## 2013-07-18 DIAGNOSIS — R51 Headache: Secondary | ICD-10-CM | POA: Diagnosis not present

## 2013-07-18 DIAGNOSIS — R519 Headache, unspecified: Secondary | ICD-10-CM

## 2013-07-18 DIAGNOSIS — I10 Essential (primary) hypertension: Secondary | ICD-10-CM

## 2013-07-18 LAB — BASIC METABOLIC PANEL
BUN: 17 mg/dL (ref 6–23)
CO2: 25 mEq/L (ref 19–32)
Chloride: 102 mEq/L (ref 96–112)
Creatinine, Ser: 0.69 mg/dL (ref 0.50–1.10)
Glucose, Bld: 172 mg/dL — ABNORMAL HIGH (ref 70–99)

## 2013-07-18 LAB — CBC
Hemoglobin: 15.4 g/dL — ABNORMAL HIGH (ref 12.0–15.0)
MCH: 30.6 pg (ref 26.0–34.0)
MCHC: 35.3 g/dL (ref 30.0–36.0)
Platelets: 101 10*3/uL — ABNORMAL LOW (ref 150–400)
RDW: 13.1 % (ref 11.5–15.5)

## 2013-07-18 LAB — POCT I-STAT TROPONIN I: Troponin i, poc: 0.01 ng/mL (ref 0.00–0.08)

## 2013-07-18 MED ORDER — HYDROCODONE-ACETAMINOPHEN 5-325 MG PO TABS
1.0000 | ORAL_TABLET | Freq: Once | ORAL | Status: AC
  Administered 2013-07-18: 1 via ORAL
  Filled 2013-07-18: qty 1

## 2013-07-18 MED ORDER — HYDRALAZINE HCL 20 MG/ML IJ SOLN
10.0000 mg | Freq: Once | INTRAMUSCULAR | Status: AC
  Administered 2013-07-18: 10 mg via INTRAMUSCULAR

## 2013-07-18 MED ORDER — HYDRALAZINE HCL 20 MG/ML IJ SOLN
10.0000 mg | Freq: Once | INTRAMUSCULAR | Status: DC
  Filled 2013-07-18: qty 1

## 2013-07-18 MED ORDER — IBUPROFEN 800 MG PO TABS
800.0000 mg | ORAL_TABLET | Freq: Once | ORAL | Status: AC
  Administered 2013-07-18: 800 mg via ORAL
  Filled 2013-07-18: qty 1

## 2013-07-18 NOTE — ED Provider Notes (Signed)
CSN: [id number]     Arrival date & time 07/17/13  2323 History     First MD Initiated Contact with Patient 07/18/13 [phone]     Chief Complaint  Patient presents with  . Hypertension   (Consider location/radiation/quality/duration/timing/severity/associated sxs/prior Treatment) HPI Comments: Patient arrives with high blood pressure and headache since 5 PM. She endorses a gradual onset headache he denies thunderclap onset. She's been intermittently compliant with her blood pressure medications. She was out of her Cozaar for 1 week. She's been compliant with her Lopressor. She denies any chest pain, shortness of breath, nausea or vomiting. No visual changes. Denies any focal weakness, numbness or tingling. Denies any speech problems. She has some balance issues at baseline and states that these are unchanged. Her headache is somewhat improved since arrival. Her blood pressure was 528 systolic.  The history is provided by the patient and the EMS personnel.    Past Medical History  Diagnosis Date  . Asthma   . Diabetes mellitus   . Osteopenia   . Renal cell carcinoma   . Goiter   . Hypertension   . Hyperlipidemia   . Blood transfusion    Past Surgical History  Procedure Laterality Date  . Nephrectomy    . Appendectomy    . Abdominal hysterectomy    . Knee surgery     Family History  Problem Relation Age of Onset  . Cancer Sister     bladder  . Kidney cancer Father   . Cancer Father     renal  . Coronary artery disease    . Diabetes    . Hyperlipidemia    . Hypertension     History  Substance Use Topics  . Smoking status: Never Smoker   . Smokeless tobacco: Never Used  . Alcohol Use: No   OB History   Grav Para Term Preterm Abortions TAB SAB Ect Mult Living                 Review of Systems  Constitutional: Negative for fever, activity change and appetite change.  HENT: Negative for congestion and rhinorrhea.   Respiratory: Negative for cough, chest tightness and  shortness of breath.   Cardiovascular: Negative for chest pain.  Gastrointestinal: Negative for nausea, vomiting and abdominal pain.  Genitourinary: Negative for dysuria, hematuria, vaginal bleeding and vaginal discharge.  Musculoskeletal: Negative for back pain.  Skin: Negative for rash.  Neurological: Positive for dizziness and headaches. Negative for weakness and light-headedness.  A complete 10 system review of systems was obtained and all systems are negative except as noted in the HPI and PMH.    Allergies  Cefuroxime axetil and Ciprofloxacin  Home Medications   Current Outpatient Rx  Name  Route  Sig  Dispense  Refill  . ezetimibe-simvastatin (VYTORIN) 10-20 MG per tablet   Oral   Take 1 tablet by mouth at bedtime.         . insulin glargine (LANTUS) 100 UNIT/ML injection   Subcutaneous   Inject 55 Units into the skin every evening.          . insulin glulisine (APIDRA) 100 UNIT/ML injection   Subcutaneous   Inject 10-40 Units into the skin 2 (two) times daily. 40  Units sq in am and 10 sq before dinner         . levothyroxine (SYNTHROID, LEVOTHROID) 25 MCG tablet   Oral   Take 25 mcg by mouth daily.          Marland Kitchen  LORazepam (ATIVAN) 0.5 MG tablet   Oral   Take 0.5 mg by mouth 2 (two) times daily as needed for anxiety.          Marland Kitchen losartan (COZAAR) 100 MG tablet      take 1 tablet by mouth once daily   30 tablet   5   . metoprolol tartrate (LOPRESSOR) 25 MG tablet   Oral   Take 25 mg by mouth 2 (two) times daily.          . ondansetron (ZOFRAN-ODT) 8 MG disintegrating tablet   Oral   Take 8 mg by mouth every 12 (twelve) hours as needed for nausea.         . pantoprazole (PROTONIX) 40 MG tablet   Oral   Take 40 mg by mouth daily.          . traMADol (ULTRAM) 50 MG tablet      take 2 tablets by mouth twice a day if needed   90 tablet   0    BP 176/74  Pulse 62  Temp(Src) 97.5 F (36.4 C) (Oral)  Resp 14  SpO2 95% Physical Exam   Constitutional: She is oriented to person, place, and time. She appears well-developed and well-nourished. No distress.  HENT:  Head: Normocephalic and atraumatic.  Mouth/Throat: Oropharynx is clear and moist. No oropharyngeal exudate.  Eyes: Conjunctivae and EOM are normal. Pupils are equal, round, and reactive to light.  Neck: Normal range of motion. Neck supple.  Cardiovascular: Normal rate, regular rhythm and normal heart sounds.   No murmur heard. Pulmonary/Chest: Effort normal and breath sounds normal. No respiratory distress.  Abdominal: Soft. There is no tenderness. There is no rebound and no guarding.  Musculoskeletal: Normal range of motion. She exhibits no edema and no tenderness.  Neurological: She is alert and oriented to person, place, and time. No cranial nerve deficit. She exhibits normal muscle tone. Coordination normal.  CN 2-12 intact, no ataxia on finger to nose, no nystagmus, 5/5 strength throughout, no pronator drift, Romberg negative, normal gait.   Skin: Skin is warm.    ED Course   Procedures (including critical care time)  Labs Reviewed  CBC - Abnormal; Notable for the following:    Hemoglobin 15.4 (*)    Platelets 101 (*)    All other components within normal limits  BASIC METABOLIC PANEL - Abnormal; Notable for the following:    Glucose, Bld 172 (*)    GFR calc non Af Amer 82 (*)    All other components within normal limits  POCT I-STAT TROPONIN I   Ct Head Wo Contrast  07/18/2013   *RADIOLOGY REPORT*  Clinical Data: Hypertension.  Posterior headache.  CT HEAD WITHOUT CONTRAST  Technique:  Contiguous axial images were obtained from the base of the skull through the vertex without contrast.  Comparison: Head CT 05/12/2012.  Findings: Physiologic calcifications in the basal ganglia are again noted.  Mild cerebral and cerebellar atrophy.  Patchy and confluent areas of decreased attenuation throughout the deep and periventricular white matter of the cerebral  hemispheres bilaterally, compatible with chronic microvascular ischemic disease. No acute intracranial abnormalities.  Specifically, no evidence of acute intracranial hemorrhage, no definite findings of acute/subacute cerebral ischemia, no mass, mass effect, hydrocephalus or abnormal intra or extra-axial fluid collections. Visualized paranasal sinuses and mastoids are well pneumatized, with exception of a small polypoid density in the posterior right maxillary sinus.  No acute displaced skull fractures are identified.  IMPRESSION: 1.  No acute intracranial abnormalities. 2.  Mild cerebral and cerebellar atrophy with extensive chronic microvascular ischemic changes in the cerebral white matter, as above. 3.  Small mucosal retention cyst or polyp in the posterior aspect of the right maxillary sinus incidentally noted.   Original Report Authenticated By: Vinnie Langton, M.D.   1. Hypertension   2. Headache     MDM  Hypertension with gradual onset headache. No thunderclap onset. Nonfocal neuro exam. Blood pressure improved to 983 systolic.  Nonfocal neurological exam. CT head is negative for hemorrhage or other acute abnormality. Blood pressure has spontaneously improved from 382N to 053 systolic. Headache improved as well. No evidence of TIA or CVA.  Patient admits to poor compliance with her blood pressure medications. She now has her prescriptions refilled. No dizziness, visual change, focal weakness, numbness or tingling.   Date: 07/18/2013  Rate: 64  Rhythm: normal sinus rhythm  QRS Axis: normal  Intervals: normal  ST/T Wave abnormalities: nonspecific ST/T changes  Conduction Disutrbances:none  Narrative Interpretation: inferior lateral T wave inversions  Old EKG Reviewed: unchanged    Ezequiel Essex, MD 07/18/13 [phone]

## 2013-07-18 NOTE — ED Notes (Signed)
NURSE FIRST ROUNDS: NURSE EXPLAINED DELAY / WAIT TIME AND PROCESS TO PT. DENEIS PAIN AT THIS TIME / RESPIRATIONS UNLABORED.

## 2013-07-20 ENCOUNTER — Encounter (HOSPITAL_BASED_OUTPATIENT_CLINIC_OR_DEPARTMENT_OTHER): Payer: Self-pay

## 2013-07-20 ENCOUNTER — Emergency Department (HOSPITAL_BASED_OUTPATIENT_CLINIC_OR_DEPARTMENT_OTHER)
Admission: EM | Admit: 2013-07-20 | Discharge: 2013-07-20 | Disposition: A | Discharge status: Home / Self Care | Payer: Medicare Other | Attending: Emergency Medicine | Admitting: Emergency Medicine

## 2013-07-20 DIAGNOSIS — I119 Hypertensive heart disease without heart failure: Secondary | ICD-10-CM | POA: Diagnosis not present

## 2013-07-20 DIAGNOSIS — Z794 Long term (current) use of insulin: Secondary | ICD-10-CM | POA: Insufficient documentation

## 2013-07-20 DIAGNOSIS — I10 Essential (primary) hypertension: Secondary | ICD-10-CM | POA: Insufficient documentation

## 2013-07-20 DIAGNOSIS — Z862 Personal history of diseases of the blood and blood-forming organs and certain disorders involving the immune mechanism: Secondary | ICD-10-CM | POA: Insufficient documentation

## 2013-07-20 DIAGNOSIS — Z79899 Other long term (current) drug therapy: Secondary | ICD-10-CM | POA: Diagnosis not present

## 2013-07-20 DIAGNOSIS — J45909 Unspecified asthma, uncomplicated: Secondary | ICD-10-CM | POA: Diagnosis not present

## 2013-07-20 DIAGNOSIS — R51 Headache: Secondary | ICD-10-CM | POA: Diagnosis not present

## 2013-07-20 DIAGNOSIS — Z8739 Personal history of other diseases of the musculoskeletal system and connective tissue: Secondary | ICD-10-CM | POA: Diagnosis not present

## 2013-07-20 DIAGNOSIS — Z8550 Personal history of malignant neoplasm of unspecified urinary tract organ: Secondary | ICD-10-CM | POA: Diagnosis not present

## 2013-07-20 DIAGNOSIS — Z8639 Personal history of other endocrine, nutritional and metabolic disease: Secondary | ICD-10-CM | POA: Insufficient documentation

## 2013-07-20 DIAGNOSIS — E119 Type 2 diabetes mellitus without complications: Secondary | ICD-10-CM | POA: Diagnosis not present

## 2013-07-20 LAB — BASIC METABOLIC PANEL
CO2: 27 mEq/L (ref 19–32)
Calcium: 10.3 mg/dL (ref 8.4–10.5)
Glucose, Bld: 248 mg/dL — ABNORMAL HIGH (ref 70–99)
Sodium: 141 mEq/L (ref 135–145)

## 2013-07-20 LAB — CBC
Hemoglobin: 16 g/dL — ABNORMAL HIGH (ref 12.0–15.0)
MCH: 30.5 pg (ref 26.0–34.0)
RBC: 5.25 MIL/uL — ABNORMAL HIGH (ref 3.87–5.11)

## 2013-07-20 MED ORDER — OXYCODONE-ACETAMINOPHEN 5-325 MG PO TABS
1.0000 | ORAL_TABLET | Freq: Once | ORAL | Status: AC
  Administered 2013-07-20: 1 via ORAL
  Filled 2013-07-20 (×2): qty 1

## 2013-07-20 NOTE — ED Provider Notes (Signed)
CSN: [id number]     Arrival date & time 07/20/13  1342 History     First MD Initiated Contact with Patient 07/20/13 1416     Chief Complaint  Patient presents with  . Hypertension   (Consider location/radiation/quality/duration/timing/severity/associated sxs/prior Treatment) HPI Pt presents with c/o elevated blood pressure.  She was seen 2 days ago in the ED at [facility] for hypertension and headache.  Pt states she has no weakness, no changes in vision or speech, no chest pain, no sob.  She takes losartan and metoprolol- however she states that she was about to run out of losartan and was trying to space out the doses to make the medication last longer. There are no other associated systemic symptoms, there are no other alleviating or modifying factors.   Past Medical History  Diagnosis Date  . Asthma   . Diabetes mellitus   . Osteopenia   . Renal cell carcinoma   . Goiter   . Hypertension   . Hyperlipidemia   . Blood transfusion    Past Surgical History  Procedure Laterality Date  . Nephrectomy    . Appendectomy    . Abdominal hysterectomy    . Knee surgery     Family History  Problem Relation Age of Onset  . Cancer Sister     bladder  . Kidney cancer Father   . Cancer Father     renal  . Coronary artery disease    . Diabetes    . Hyperlipidemia    . Hypertension     History  Substance Use Topics  . Smoking status: Never Smoker   . Smokeless tobacco: Never Used  . Alcohol Use: No   OB History   Grav Para Term Preterm Abortions TAB SAB Ect Mult Living                 Review of Systems ROS reviewed and all otherwise negative except for mentioned in HPI  Allergies  Cefuroxime axetil and Ciprofloxacin  Home Medications   Current Outpatient Rx  Name  Route  Sig  Dispense  Refill  . ezetimibe-simvastatin (VYTORIN) 10-20 MG per tablet   Oral   Take 1 tablet by mouth at bedtime.         . insulin glargine (LANTUS) 100 UNIT/ML injection    Subcutaneous   Inject 55 Units into the skin every evening.          . insulin glulisine (APIDRA) 100 UNIT/ML injection   Subcutaneous   Inject 10-40 Units into the skin 2 (two) times daily. 40  Units sq in am and 10 sq before dinner         . levothyroxine (SYNTHROID, LEVOTHROID) 25 MCG tablet   Oral   Take 25 mcg by mouth daily.          Marland Kitchen LORazepam (ATIVAN) 0.5 MG tablet   Oral   Take 0.5 mg by mouth 2 (two) times daily as needed for anxiety.          Marland Kitchen losartan (COZAAR) 100 MG tablet      take 1 tablet by mouth once daily   30 tablet   5   . metoprolol tartrate (LOPRESSOR) 25 MG tablet   Oral   Take 25 mg by mouth 2 (two) times daily.          . ondansetron (ZOFRAN-ODT) 8 MG disintegrating tablet   Oral   Take 8 mg by mouth every 12 (twelve) hours as  needed for nausea.         . pantoprazole (PROTONIX) 40 MG tablet   Oral   Take 40 mg by mouth daily.          . traMADol (ULTRAM) 50 MG tablet      take 2 tablets by mouth twice a day if needed   90 tablet   0    BP 163/83  Pulse 68  Temp(Src) 98.2 F (36.8 C) (Oral)  Resp 16  Ht 5' 3.5" (1.613 m)  Wt 191 lb (86.637 kg)  BMI 33.3 kg/m2  SpO2 99% Vitals reviewed Physical Exam Physical Examination: General appearance - alert, well appearing, and in no distress Mental status - alert, oriented to person, place, and time Eyes - pupils equal and reactive, extraocular eye movements intact Mouth - mucous membranes moist, pharynx normal without lesions Chest - clear to auscultation, no wheezes, rales or rhonchi, symmetric air entry Heart - normal rate, regular rhythm, normal S1, S2, no murmurs, rubs, clicks or gallops Abdomen - soft, nontender, nondistended, no masses or organomegaly Neurological - alert, oriented x 3, cranial nerves grossly intact, strength 5/5 in extremities x 4, sensation intact Extremities - peripheral pulses normal, no pedal edema, no clubbing or cyanosis Skin - normal coloration  and turgor, no rashes ED Course   Procedures (including critical care time)   Date: 07/21/2013  Rate: 69  Rhythm: normal sinus rhythm  QRS Axis: normal  Intervals: normal  ST/T Wave abnormalities: diffuse t wave flattening, poor r wave progression  Conduction Disutrbances:none  Narrative Interpretation:   Old EKG Reviewed: unchanged compared to prior   Labs Reviewed  CBC - Abnormal; Notable for the following:    RBC 5.25 (*)    Hemoglobin 16.0 (*)    Platelets 116 (*)    All other components within normal limits  BASIC METABOLIC PANEL - Abnormal; Notable for the following:    Glucose, Bld 248 (*)    GFR calc non Af Amer 70 (*)    GFR calc Af Amer 81 (*)    All other components within normal limits   No results found. 1. Hypertension     MDM  Pt with hypertension- no signs or symptoms c/w end organ damage.  I have had a long discussion with her about ED workup for HTN and importance of f/u with her PMD to continue to have bp checked and medications adjusted as need be.  Also discussed return to the ED for stroke symptoms (which we discussed in detail), shortness of breath, chest pain, etc.  Discharged with strict return precautions.  Pt agreeable with plan.  Threasa Beards, MD 07/21/13 [phone]

## 2013-07-20 NOTE — ED Notes (Signed)
Patient from home, ws seem at [facility] for high blood pressure and got discharged home. Here today for HTN and headache again. BP was 153/73 in room. Been taking her cozaar every 3 days (because she was running low on cozaar). Diabetic as well CBG 249 with EMS, self injected with 35 unit of Apidra. Pt denied dizziness, vision problem, denied sob, denied chest pain.

## 2013-07-20 NOTE — ED Notes (Signed)
Patient had BP readings of the followings: 07/19/13-234/95, 07/20/13-200/104

## 2013-07-21 ENCOUNTER — Ambulatory Visit (INDEPENDENT_AMBULATORY_CARE_PROVIDER_SITE_OTHER): Payer: Medicare Other | Admitting: Family Medicine

## 2013-07-21 ENCOUNTER — Encounter: Payer: Self-pay | Admitting: Family Medicine

## 2013-07-21 ENCOUNTER — Telehealth: Payer: Self-pay | Admitting: Family Medicine

## 2013-07-21 VITALS — BP 192/76 | HR 72 | Temp 98.4°F | Wt 193.0 lb

## 2013-07-21 DIAGNOSIS — I10 Essential (primary) hypertension: Secondary | ICD-10-CM

## 2013-07-21 DIAGNOSIS — M549 Dorsalgia, unspecified: Secondary | ICD-10-CM

## 2013-07-21 MED ORDER — HYDROCODONE-ACETAMINOPHEN 5-325 MG PO TABS: 1.0000 | ORAL_TABLET | Freq: Four times a day (QID) | ORAL | Status: DC | PRN

## 2013-07-21 MED ORDER — AMLODIPINE BESYLATE 10 MG PO TABS: 10.0000 mg | ORAL_TABLET | Freq: Every day | ORAL | Status: DC

## 2013-07-21 NOTE — Telephone Encounter (Signed)
Attempted to reach patient regarding elevated BP, headache.  On callback, number busy; unable to leave message.  krs/can

## 2013-07-21 NOTE — Telephone Encounter (Signed)
Patient has a pending apt today at 3:45 with Dr.Lowne.       KP

## 2013-07-21 NOTE — Patient Instructions (Signed)

## 2013-07-22 ENCOUNTER — Encounter: Payer: Self-pay | Admitting: Family Medicine

## 2013-07-22 DIAGNOSIS — E669 Obesity, unspecified: Secondary | ICD-10-CM | POA: Insufficient documentation

## 2013-07-22 NOTE — Progress Notes (Signed)
  Subjective:    Patient here for follow-up of elevated blood pressure.  She is not exercising and is adherent to a low-salt diet.  Blood pressure is not well controlled at home. Cardiac symptoms: none. Patient denies: chest pain, chest pressure/discomfort, claudication, dyspnea, exertional chest pressure/discomfort, fatigue, irregular heart beat, lower extremity edema, near-syncope, orthopnea, palpitations, paroxysmal nocturnal dyspnea, syncope and tachypnea. Cardiovascular risk factors: none. Use of agents associated with hypertension: none. History of target organ damage: none.Pt was seen in ER 2 x last week.  She ran out of meds and now c/o headaches.   The following portions of the patient's history were reviewed and updated as appropriate: allergies, current medications, past family history, past medical history, past social history, past surgical history and problem list.  Review of Systems Pertinent items are noted in HPI.     Objective:    BP 192/76  Pulse 72  Temp(Src) 98.4 F (36.9 C) (Oral)  Wt 193 lb (87.544 kg)  BMI 33.65 kg/m2  SpO2 95% General appearance: alert, cooperative, appears stated age and no distress Lungs: clear to auscultation bilaterally Heart: S1, S2 normal Extremities: extremities normal, atraumatic, no cyanosis or edema    Assessment:    Hypertension, stage 1 . Evidence of target organ damage: none.    Plan:    Medication: discontinue losartan and resume norvasc. Dietary sodium restriction. Regular aerobic exercise. Check blood pressures 2-3 times weekly and record. Follow up: 3 weeks and as needed.

## 2013-07-25 DIAGNOSIS — L909 Atrophic disorder of skin, unspecified: Secondary | ICD-10-CM | POA: Diagnosis not present

## 2013-07-25 DIAGNOSIS — D2370 Other benign neoplasm of skin of unspecified lower limb, including hip: Secondary | ICD-10-CM | POA: Diagnosis not present

## 2013-07-25 DIAGNOSIS — D2330 Other benign neoplasm of skin of unspecified part of face: Secondary | ICD-10-CM | POA: Diagnosis not present

## 2013-07-25 DIAGNOSIS — D1801 Hemangioma of skin and subcutaneous tissue: Secondary | ICD-10-CM | POA: Diagnosis not present

## 2013-07-28 DIAGNOSIS — M546 Pain in thoracic spine: Secondary | ICD-10-CM | POA: Diagnosis not present

## 2013-07-28 DIAGNOSIS — M545 Low back pain, unspecified: Secondary | ICD-10-CM | POA: Diagnosis not present

## 2013-07-28 DIAGNOSIS — IMO0002 Reserved for concepts with insufficient information to code with codable children: Secondary | ICD-10-CM | POA: Diagnosis not present

## 2013-08-17 ENCOUNTER — Other Ambulatory Visit: Payer: Self-pay | Admitting: Family Medicine

## 2013-08-18 ENCOUNTER — Telehealth: Payer: Self-pay | Admitting: Family Medicine

## 2013-08-18 MED ORDER — HYDROCODONE-ACETAMINOPHEN 10-325 MG PO TABS: ORAL_TABLET | ORAL | Status: DC

## 2013-08-18 NOTE — Telephone Encounter (Signed)
Ok to inc 10/325 mg  Refill x1

## 2013-08-18 NOTE — Telephone Encounter (Signed)
Patient is requesting an rx for Hydrocodone 10-360m. States that she has been given this dosage before and asks that we do not give her the 578mbecause she will then have to take two. Patient uses [facility] on [address]. Please advise.

## 2013-08-18 NOTE — Telephone Encounter (Signed)
Rx sent.      KP

## 2013-08-18 NOTE — Telephone Encounter (Signed)
S-325 was sent on 07/21/13. Please advise if you want to increase the medication.      KP

## 2013-08-19 ENCOUNTER — Telehealth: Payer: Self-pay | Admitting: Family Medicine

## 2013-08-19 NOTE — Telephone Encounter (Signed)
Patient states that [facility] pharmacy did not receive updated rx for Hydrocodone. Called to confirm and they do not have it. Please re-send.

## 2013-08-19 NOTE — Telephone Encounter (Signed)
Rx sent this am. Will resend.       KP

## 2013-08-28 ENCOUNTER — Other Ambulatory Visit: Payer: Self-pay | Admitting: Family Medicine

## 2013-08-28 NOTE — Telephone Encounter (Signed)
Patient is having back pain and says she is out of her Tramadol. Would like sent before the close of the day if possible. Please call patient when sent.

## 2013-08-28 NOTE — Telephone Encounter (Signed)
Last seen 07/21/13 and filled 05/26/13 #90. Please advise     KP

## 2013-08-28 NOTE — Telephone Encounter (Signed)
Rx phoned in and left on the pharmacy refill line.      KP

## 2013-09-23 ENCOUNTER — Other Ambulatory Visit: Payer: Self-pay | Admitting: Family Medicine

## 2013-09-23 NOTE — Telephone Encounter (Signed)
Patient called and requested a refill for HYDROcodone-acetaminophen (NORCO) 10-325 MG per tablet   thanks

## 2013-09-23 NOTE — Telephone Encounter (Signed)
Hydrocodone filled 08/18/13. Please advise    KP

## 2013-09-23 NOTE — Telephone Encounter (Signed)
Last seen 07/21/13 and filled 05/30/13 #60 with 1 refill. UDS 05/26/13. Please advise       KP

## 2013-09-25 ENCOUNTER — Telehealth: Payer: Self-pay | Admitting: Family Medicine

## 2013-09-25 MED ORDER — HYDROCODONE-ACETAMINOPHEN 10-325 MG PO TABS: ORAL_TABLET | ORAL | Status: DC

## 2013-09-25 NOTE — Telephone Encounter (Signed)
Refill x1

## 2013-09-25 NOTE — Telephone Encounter (Signed)
Rx faxed      KP

## 2013-09-25 NOTE — Telephone Encounter (Signed)
Patient is requesting a new prescription of hydrocodone

## 2013-09-25 NOTE — Telephone Encounter (Signed)
Last seen 07/21/13 and filled 08/18/13 #30. UDS 05/26/13 Low risk. Pease advise      KP

## 2013-09-25 NOTE — Telephone Encounter (Signed)
Patient aware Rx will be ready for pick up tomorrow.      KP 

## 2013-10-10 ENCOUNTER — Other Ambulatory Visit: Payer: Medicare Other | Admitting: Lab

## 2013-10-10 ENCOUNTER — Ambulatory Visit: Payer: Medicare Other | Admitting: Hematology & Oncology

## 2013-10-10 ENCOUNTER — Telehealth: Payer: Self-pay | Admitting: Hematology & Oncology

## 2013-10-10 NOTE — Telephone Encounter (Signed)
Pt called to cancel due to a family emergency

## 2013-10-17 ENCOUNTER — Other Ambulatory Visit: Payer: Medicare Other | Admitting: Lab

## 2013-10-17 ENCOUNTER — Ambulatory Visit: Payer: Medicare Other | Admitting: Hematology & Oncology

## 2013-10-21 ENCOUNTER — Other Ambulatory Visit: Payer: Self-pay | Admitting: Family Medicine

## 2013-10-21 DIAGNOSIS — K7689 Other specified diseases of liver: Secondary | ICD-10-CM | POA: Diagnosis not present

## 2013-10-21 DIAGNOSIS — D696 Thrombocytopenia, unspecified: Secondary | ICD-10-CM | POA: Diagnosis not present

## 2013-10-21 DIAGNOSIS — K7460 Unspecified cirrhosis of liver: Secondary | ICD-10-CM | POA: Diagnosis not present

## 2013-10-21 NOTE — Telephone Encounter (Signed)
Patient is requesting Tramadol.  Last seen-07/21/2013  Last filled-08/28/2013  UDS-05/26/2013 low risk, contract signed  Please advise. SW

## 2013-10-23 ENCOUNTER — Other Ambulatory Visit: Payer: Self-pay | Admitting: Family Medicine

## 2013-10-31 ENCOUNTER — Telehealth: Payer: Self-pay | Admitting: *Deleted

## 2013-10-31 MED ORDER — HYDROCODONE-ACETAMINOPHEN 10-325 MG PO TABS: ORAL_TABLET | ORAL | Status: DC

## 2013-10-31 NOTE — Telephone Encounter (Signed)
Patient aware Rx ready for pick up.      KP

## 2013-10-31 NOTE — Telephone Encounter (Signed)
Patient would like to know if she could get a prescription for hydrocodone. She states that when she really have bad back pain the tramadol doesn't work.  Last seen-07/21/2013  Last filled-09/25/2013  UDS-05/26/2013 low risk, contract signed  Please advise. SW

## 2013-10-31 NOTE — Telephone Encounter (Signed)
Refill x1 but if pain is worse she should be seen next week

## 2013-11-10 ENCOUNTER — Telehealth: Payer: Self-pay | Admitting: Family Medicine

## 2013-11-10 MED ORDER — HYDROCODONE-ACETAMINOPHEN 10-325 MG PO TABS: ORAL_TABLET | ORAL | Status: DC

## 2013-11-10 NOTE — Telephone Encounter (Signed)
Patient called and requested a refill for HYDROcodone-acetaminophen (NORCO) 10-325 MG per tablet  

## 2013-11-10 NOTE — Telephone Encounter (Signed)
Last seen 07/21/13 and filled 10/31/13 #30. Please advise      KP

## 2013-11-10 NOTE — Telephone Encounter (Signed)
Refill x1 

## 2013-11-10 NOTE — Telephone Encounter (Signed)
Printed and left at check in. Patient is aware       KP

## 2013-11-10 NOTE — Telephone Encounter (Signed)
Rx sent      KP

## 2013-11-12 ENCOUNTER — Telehealth: Payer: Self-pay | Admitting: Family Medicine

## 2013-11-12 NOTE — Telephone Encounter (Signed)
Patient is requesting a refill for HYDROcodone-acetaminophen (NORCO) 10-325 MG per tablet.

## 2013-11-12 NOTE — Telephone Encounter (Signed)
I spoke with patient and she is aware Rx ready for pick up 2 days ago.     KP

## 2013-11-14 ENCOUNTER — Telehealth: Payer: Self-pay | Admitting: *Deleted

## 2013-11-14 NOTE — Telephone Encounter (Signed)
Haley from Dr. [facility] office called and wanted to advise provider that the patient has been dismissed from their practice due to receiving controlled substance prescriptions form both their office and here at this office.

## 2013-11-15 NOTE — Telephone Encounter (Signed)
noted

## 2013-11-24 ENCOUNTER — Telehealth: Payer: Self-pay | Admitting: *Deleted

## 2013-11-24 NOTE — Telephone Encounter (Signed)
Too early for refill and was getting it from another provider.

## 2013-11-24 NOTE — Telephone Encounter (Signed)
Margaret Dyer from dr [facility] called and stated that they did not refill herHYDROcodone-acetaminophen ([place]) 10-325 MG per tablet at there office this month. Also Margaret Dyer stated that they terminated her.

## 2013-11-24 NOTE — Telephone Encounter (Signed)
Patients states that she never received a printed prescription for her hydrocodone. Patient states that she only contacted their office because Dr. Etter Sjogren only gave her a 10-day supply of hydrocodone. Patient states that is in a lot of back pain and really need this medication.

## 2013-11-24 NOTE — Telephone Encounter (Signed)
Last seen-07/21/2013  Last filled-11/10/2013  UDS-05/26/2013 low risk, contract signed   Please advise. SW

## 2013-11-25 NOTE — Telephone Encounter (Signed)
Spoke with patient and explained that it was too early to fill [place]. Appointment scheduled for 12/05/12 at 3:30

## 2013-12-05 ENCOUNTER — Ambulatory Visit (INDEPENDENT_AMBULATORY_CARE_PROVIDER_SITE_OTHER): Payer: Medicare Other | Admitting: Family Medicine

## 2013-12-05 ENCOUNTER — Encounter: Payer: Self-pay | Admitting: Family Medicine

## 2013-12-05 VITALS — BP 124/68 | HR 78 | Temp 99.9°F | Wt 201.8 lb

## 2013-12-05 DIAGNOSIS — I739 Peripheral vascular disease, unspecified: Secondary | ICD-10-CM

## 2013-12-05 DIAGNOSIS — F411 Generalized anxiety disorder: Secondary | ICD-10-CM

## 2013-12-05 DIAGNOSIS — G894 Chronic pain syndrome: Secondary | ICD-10-CM

## 2013-12-05 DIAGNOSIS — J069 Acute upper respiratory infection, unspecified: Secondary | ICD-10-CM | POA: Diagnosis not present

## 2013-12-05 MED ORDER — HYDROCODONE-ACETAMINOPHEN 10-325 MG PO TABS: ORAL_TABLET | ORAL | Status: DC

## 2013-12-05 MED ORDER — LORAZEPAM 0.5 MG PO TABS: ORAL_TABLET | ORAL | Status: DC

## 2013-12-05 MED ORDER — BENZONATATE 100 MG PO CAPS: 100.0000 mg | ORAL_CAPSULE | Freq: Two times a day (BID) | ORAL | Status: DC | PRN

## 2013-12-05 MED ORDER — FLUTICASONE PROPIONATE 50 MCG/ACT NA SUSP: 2.0000 | Freq: Every day | NASAL | Status: DC

## 2013-12-05 MED ORDER — LORATADINE 10 MG PO TABS: 10.0000 mg | ORAL_TABLET | Freq: Every day | ORAL | Status: DC

## 2013-12-05 NOTE — Progress Notes (Signed)
  Subjective:     Margaret Dyer is a 79 y.o. female who presents for evaluation of sinus pain. Symptoms include: congestion, cough, nasal congestion and post nasal drip. Onset of symptoms was 5 days ago. Symptoms have been gradually worsening since that time. Past history is significant for no history of pneumonia or bronchitis. Patient is a non-smoker.  Pt also c/o pain in legs with walking a short distance and then it improves with rest.  She is requesting a doppler.    The following portions of the patient's history were reviewed and updated as appropriate: allergies, current medications, past family history, past medical history, past social history, past surgical history and problem list.  Review of Systems Pertinent items are noted in HPI.   Objective:    BP 124/68  Pulse 78  Temp(Src) 99.9 F (37.7 C) (Oral)  Wt 201 lb 12.8 oz (91.536 kg)  SpO2 97% General appearance: alert, cooperative, appears stated age and no distress Ears: normal TM's and external ear canals both ears Nose: clear discharge, mild congestion, no sinus tenderness Throat: abnormal findings: mild oropharyngeal erythema and pnd Neck: moderate anterior cervical adenopathy, supple, symmetrical, trachea midline and thyroid not enlarged, symmetric, no tenderness/mass/nodules Lungs: clear to auscultation bilaterally Heart: S1, S2 normal Extremities: extremities normal, atraumatic, no cyanosis or edema and no calf pain    Assessment:    URI.    Plan:    Nasal saline sprays. Nasal steroids per medication orders. Antihistamines per medication orders.

## 2013-12-05 NOTE — Progress Notes (Signed)
Pre visit review using our clinic review tool, if applicable. No additional management support is needed unless otherwise documented below in the visit note.

## 2013-12-05 NOTE — Assessment & Plan Note (Signed)
Check art doppler

## 2013-12-05 NOTE — Patient Instructions (Signed)
Upper Respiratory Infection, Adult An upper respiratory infection (URI) is also known as the common cold. It is often caused by a type of germ (virus). Colds are easily spread (contagious). You can pass it to others by kissing, coughing, sneezing, or drinking out of the same glass. Usually, you get better in 1 or 2 weeks.  HOME CARE   Only take medicine as told by your doctor.  Use a warm mist humidifier or breathe in steam from a hot shower.  Drink enough water and fluids to keep your pee (urine) clear or pale yellow.  Get plenty of rest.  Return to work when your temperature is back to normal or as told by your doctor. You may use a face mask and wash your hands to stop your cold from spreading. GET HELP RIGHT AWAY IF:   After the first few days, you feel you are getting worse.  You have questions about your medicine.  You have chills, shortness of breath, or brown or red spit (mucus).  You have yellow or brown snot (nasal discharge) or pain in the face, especially when you bend forward.  You have a fever, puffy (swollen) neck, pain when you swallow, or white spots in the back of your throat.  You have a bad headache, ear pain, sinus pain, or chest pain.  You have a high-pitched whistling sound when you breathe in and out (wheezing).  You have a lasting cough or cough up blood.  You have sore muscles or a stiff neck. MAKE SURE YOU:   Understand these instructions.  Will watch your condition.  Will get help right away if you are not doing well or get worse. Document Released: 05/08/2008 Document Revised: 02/12/2012 Document Reviewed: 03/27/2011 [facility] Patient Information 2014 [place], [place].

## 2013-12-10 DIAGNOSIS — Z79899 Other long term (current) drug therapy: Secondary | ICD-10-CM | POA: Diagnosis not present

## 2013-12-12 ENCOUNTER — Encounter (HOSPITAL_COMMUNITY): Payer: Medicare Other

## 2013-12-26 ENCOUNTER — Encounter (HOSPITAL_COMMUNITY): Payer: Medicare Other

## 2014-01-01 ENCOUNTER — Telehealth: Payer: Self-pay | Admitting: *Deleted

## 2014-01-01 NOTE — Telephone Encounter (Signed)
Patient called and requested a refill for HYDROcodone-acetaminophen (NORCO) 10-325 MG per tablet

## 2014-01-02 NOTE — Telephone Encounter (Signed)
Patient is requesting a refill on Norco.  Last OV 12/05/13 Last filled 12/05/13 #60 Agreement on file UDS 05/16/13 low risk, due for repeat UDS Okay to refill?

## 2014-01-02 NOTE — Telephone Encounter (Signed)
Refill x1 

## 2014-01-05 ENCOUNTER — Ambulatory Visit (INDEPENDENT_AMBULATORY_CARE_PROVIDER_SITE_OTHER): Payer: Medicare Other | Admitting: *Deleted

## 2014-01-05 ENCOUNTER — Other Ambulatory Visit: Payer: Self-pay | Admitting: *Deleted

## 2014-01-05 DIAGNOSIS — Z23 Encounter for immunization: Secondary | ICD-10-CM | POA: Diagnosis not present

## 2014-01-05 DIAGNOSIS — G894 Chronic pain syndrome: Secondary | ICD-10-CM

## 2014-01-05 MED ORDER — HYDROCODONE-ACETAMINOPHEN 10-325 MG PO TABS: ORAL_TABLET | ORAL | Status: DC

## 2014-01-05 NOTE — Telephone Encounter (Signed)
Patient notified script was ready for pick up up front.

## 2014-01-05 NOTE — Telephone Encounter (Signed)
Rx for Norco printed and placed on ledge for signature.

## 2014-01-21 ENCOUNTER — Ambulatory Visit (HOSPITAL_COMMUNITY): Payer: Medicare Other | Attending: Internal Medicine

## 2014-01-21 DIAGNOSIS — E1159 Type 2 diabetes mellitus with other circulatory complications: Secondary | ICD-10-CM

## 2014-01-21 DIAGNOSIS — I70219 Atherosclerosis of native arteries of extremities with intermittent claudication, unspecified extremity: Secondary | ICD-10-CM | POA: Insufficient documentation

## 2014-01-21 DIAGNOSIS — Z87891 Personal history of nicotine dependence: Secondary | ICD-10-CM | POA: Diagnosis not present

## 2014-01-21 DIAGNOSIS — I739 Peripheral vascular disease, unspecified: Secondary | ICD-10-CM | POA: Diagnosis not present

## 2014-01-21 DIAGNOSIS — E119 Type 2 diabetes mellitus without complications: Secondary | ICD-10-CM | POA: Diagnosis not present

## 2014-01-21 DIAGNOSIS — E785 Hyperlipidemia, unspecified: Secondary | ICD-10-CM | POA: Diagnosis not present

## 2014-01-21 DIAGNOSIS — I10 Essential (primary) hypertension: Secondary | ICD-10-CM | POA: Insufficient documentation

## 2014-01-31 ENCOUNTER — Other Ambulatory Visit: Payer: Self-pay | Admitting: Family Medicine

## 2014-02-02 ENCOUNTER — Telehealth: Payer: Self-pay | Admitting: Family Medicine

## 2014-02-02 DIAGNOSIS — G894 Chronic pain syndrome: Secondary | ICD-10-CM

## 2014-02-02 MED ORDER — HYDROCODONE-ACETAMINOPHEN 10-325 MG PO TABS: ORAL_TABLET | ORAL | Status: DC

## 2014-02-02 NOTE — Telephone Encounter (Signed)
Last seen 12/05/13 and filled 01/05/14 #60. Please advise      KP

## 2014-02-02 NOTE — Telephone Encounter (Signed)
Patient called and requested a refill for HYDROcodone-acetaminophen (NORCO) 10-325 MG per tablet also she states that she had x-rays done about a week ago and have not heard anything from [place]. Please advise

## 2014-02-02 NOTE — Telephone Encounter (Signed)
Refill x1 

## 2014-02-06 ENCOUNTER — Encounter: Payer: Self-pay | Admitting: Family Medicine

## 2014-02-10 ENCOUNTER — Other Ambulatory Visit: Payer: Self-pay | Admitting: Family Medicine

## 2014-02-13 ENCOUNTER — Other Ambulatory Visit: Payer: Self-pay | Admitting: Family Medicine

## 2014-02-13 NOTE — Telephone Encounter (Signed)
Last seen and filled 12/05/13 #60 with 1 refill. Please advise      KP

## 2014-02-19 ENCOUNTER — Other Ambulatory Visit: Payer: Self-pay | Admitting: Family Medicine

## 2014-02-20 ENCOUNTER — Telehealth: Payer: Self-pay | Admitting: Family Medicine

## 2014-02-20 MED ORDER — TRAMADOL HCL 50 MG PO TABS: ORAL_TABLET | ORAL | Status: DC

## 2014-02-20 NOTE — Telephone Encounter (Signed)
Rx sent     KP

## 2014-02-20 NOTE — Telephone Encounter (Signed)
Refill x1 

## 2014-02-20 NOTE — Telephone Encounter (Signed)
Patient called and stated that she wanted to know why her traMADol (ULTRAM) 50 MG tablet was refused. Please advise

## 2014-02-20 NOTE — Telephone Encounter (Signed)
Spoke with patient and she stated she wanted to get a refill on the Tramadol, I made her aware that she should not be taking both, she stated she only take the Tramadol sometimes, she said sometimes she does not like how the hydrocodone makes her feel and she takes the Tramadol. The patient said she was not taking both together. She would like to get a refill on the Tramadol. Last filled 10/11/13. Hydrocodone filled 02/02/14. Please advise     KP

## 2014-03-01 ENCOUNTER — Other Ambulatory Visit: Payer: Self-pay | Admitting: Family Medicine

## 2014-03-02 ENCOUNTER — Encounter (HOSPITAL_BASED_OUTPATIENT_CLINIC_OR_DEPARTMENT_OTHER): Payer: Self-pay | Admitting: Emergency Medicine

## 2014-03-02 ENCOUNTER — Emergency Department (HOSPITAL_BASED_OUTPATIENT_CLINIC_OR_DEPARTMENT_OTHER)
Admission: EM | Admit: 2014-03-02 | Discharge: 2014-03-02 | Disposition: A | Discharge status: Home / Self Care | Payer: Medicare Other | Attending: Emergency Medicine | Admitting: Emergency Medicine

## 2014-03-02 ENCOUNTER — Emergency Department (HOSPITAL_BASED_OUTPATIENT_CLINIC_OR_DEPARTMENT_OTHER): Payer: Medicare Other

## 2014-03-02 DIAGNOSIS — E119 Type 2 diabetes mellitus without complications: Secondary | ICD-10-CM | POA: Diagnosis not present

## 2014-03-02 DIAGNOSIS — J45909 Unspecified asthma, uncomplicated: Secondary | ICD-10-CM | POA: Diagnosis not present

## 2014-03-02 DIAGNOSIS — E049 Nontoxic goiter, unspecified: Secondary | ICD-10-CM | POA: Insufficient documentation

## 2014-03-02 DIAGNOSIS — L0291 Cutaneous abscess, unspecified: Secondary | ICD-10-CM

## 2014-03-02 DIAGNOSIS — I10 Essential (primary) hypertension: Secondary | ICD-10-CM | POA: Insufficient documentation

## 2014-03-02 DIAGNOSIS — Z79899 Other long term (current) drug therapy: Secondary | ICD-10-CM | POA: Diagnosis not present

## 2014-03-02 DIAGNOSIS — Z794 Long term (current) use of insulin: Secondary | ICD-10-CM | POA: Insufficient documentation

## 2014-03-02 DIAGNOSIS — W06XXXA Fall from bed, initial encounter: Secondary | ICD-10-CM | POA: Insufficient documentation

## 2014-03-02 DIAGNOSIS — Z85528 Personal history of other malignant neoplasm of kidney: Secondary | ICD-10-CM | POA: Insufficient documentation

## 2014-03-02 DIAGNOSIS — Y939 Activity, unspecified: Secondary | ICD-10-CM | POA: Insufficient documentation

## 2014-03-02 DIAGNOSIS — Z87891 Personal history of nicotine dependence: Secondary | ICD-10-CM | POA: Diagnosis not present

## 2014-03-02 DIAGNOSIS — Z8739 Personal history of other diseases of the musculoskeletal system and connective tissue: Secondary | ICD-10-CM | POA: Diagnosis not present

## 2014-03-02 DIAGNOSIS — Y929 Unspecified place or not applicable: Secondary | ICD-10-CM | POA: Insufficient documentation

## 2014-03-02 DIAGNOSIS — E785 Hyperlipidemia, unspecified: Secondary | ICD-10-CM | POA: Diagnosis not present

## 2014-03-02 DIAGNOSIS — L0390 Cellulitis, unspecified: Secondary | ICD-10-CM

## 2014-03-02 DIAGNOSIS — IMO0002 Reserved for concepts with insufficient information to code with codable children: Secondary | ICD-10-CM | POA: Diagnosis not present

## 2014-03-02 DIAGNOSIS — S298XXA Other specified injuries of thorax, initial encounter: Secondary | ICD-10-CM | POA: Diagnosis not present

## 2014-03-02 DIAGNOSIS — M549 Dorsalgia, unspecified: Secondary | ICD-10-CM | POA: Insufficient documentation

## 2014-03-02 DIAGNOSIS — R739 Hyperglycemia, unspecified: Secondary | ICD-10-CM

## 2014-03-02 LAB — CBC WITH DIFFERENTIAL/PLATELET
BASOS PCT: 1 % (ref 0–1)
Basophils Absolute: 0.1 10*3/uL (ref 0.0–0.1)
EOS ABS: 0.2 10*3/uL (ref 0.0–0.7)
Eosinophils Relative: 2 % (ref 0–5)
HCT: 44.4 % (ref 36.0–46.0)
HEMOGLOBIN: 15.7 g/dL — AB (ref 12.0–15.0)
Lymphocytes Relative: 22 % (ref 12–46)
Lymphs Abs: 1.7 10*3/uL (ref 0.7–4.0)
MCH: 30.6 pg (ref 26.0–34.0)
MCHC: 35.4 g/dL (ref 30.0–36.0)
MCV: 86.5 fL (ref 78.0–100.0)
Monocytes Absolute: 0.8 10*3/uL (ref 0.1–1.0)
Monocytes Relative: 10 % (ref 3–12)
Neutro Abs: 5.1 10*3/uL (ref 1.7–7.7)
Neutrophils Relative %: 65 % (ref 43–77)
Platelets: 110 10*3/uL — ABNORMAL LOW (ref 150–400)
RBC: 5.13 MIL/uL — ABNORMAL HIGH (ref 3.87–5.11)
RDW: 13.7 % (ref 11.5–15.5)
WBC: 7.9 10*3/uL (ref 4.0–10.5)

## 2014-03-02 LAB — COMPREHENSIVE METABOLIC PANEL
ALK PHOS: 100 U/L (ref 39–117)
ALT: 31 U/L (ref 0–35)
AST: 28 U/L (ref 0–37)
Albumin: 3.6 g/dL (ref 3.5–5.2)
BILIRUBIN TOTAL: 0.6 mg/dL (ref 0.3–1.2)
BUN: 23 mg/dL (ref 6–23)
CO2: 24 meq/L (ref 19–32)
Calcium: 9.5 mg/dL (ref 8.4–10.5)
Chloride: 99 mEq/L (ref 96–112)
Creatinine, Ser: 0.7 mg/dL (ref 0.50–1.10)
GFR, EST NON AFRICAN AMERICAN: 82 mL/min — AB (ref >90)
Glucose, Bld: 398 mg/dL — ABNORMAL HIGH (ref 70–99)
POTASSIUM: 4.7 meq/L (ref 3.7–5.3)
Sodium: 137 mEq/L (ref 137–147)
TOTAL PROTEIN: 7.3 g/dL (ref 6.0–8.3)

## 2014-03-02 LAB — CBG MONITORING, ED: Glucose-Capillary: 332 mg/dL — ABNORMAL HIGH (ref 70–99)

## 2014-03-02 LAB — TROPONIN I

## 2014-03-02 MED ORDER — INSULIN ASPART 100 UNIT/ML ~~LOC~~ SOLN
8.0000 [IU] | Freq: Once | SUBCUTANEOUS | Status: AC
  Administered 2014-03-02: 8 [IU] via SUBCUTANEOUS
  Filled 2014-03-02: qty 1

## 2014-03-02 MED ORDER — DOXYCYCLINE HYCLATE 100 MG PO CAPS: 100.0000 mg | ORAL_CAPSULE | Freq: Two times a day (BID) | ORAL | Status: DC

## 2014-03-02 MED ORDER — HYDROCODONE-ACETAMINOPHEN 5-325 MG PO TABS: 2.0000 | ORAL_TABLET | ORAL | Status: DC | PRN

## 2014-03-02 NOTE — ED Provider Notes (Signed)
CSN: [id number]     Arrival date & time 03/02/14  1617 History  This chart was scribed for Ezequiel Essex, MD by Rolanda Lundborg, ED Scribe. This patient was seen in room MH12/MH12 and the patient's care was started at 4:31 PM.    Chief Complaint  Patient presents with  . Arm Pain   The history is provided by the patient. No language interpreter was used.   HPI Comments: Margaret Dyer is a 79 y.o. female with a h/o HTN, DM who presents to the Emergency Department complaining of constant, gradually-worsening left arm pain onset 7 days ago with an abscess to the same area that showed up today. She denies fevers, nausea, vomiting, CP, SOB. She denies pain when she walks around. She denies injuries or falls. She is on insulin. She states her sugars have not been good because she has not been compliant with her insulin. She denies h/o heart problems.   She also states she fell out of bed 3 days ago and has been having mid lower back pain since. She denies new urinary problems.   Past Medical History  Diagnosis Date  . Asthma   . Diabetes mellitus   . Osteopenia   . Renal cell carcinoma   . Goiter   . Hypertension   . Hyperlipidemia   . Blood transfusion    Past Surgical History  Procedure Laterality Date  . Nephrectomy    . Appendectomy    . Abdominal hysterectomy    . Knee surgery    . Hemorrhoid surgery     Family History  Problem Relation Age of Onset  . Cancer Sister     bladder  . Kidney cancer Father   . Cancer Father     renal  . Coronary artery disease    . Diabetes    . Hyperlipidemia    . Hypertension     History  Substance Use Topics  . Smoking status: Former Research scientist (life sciences)  . Smokeless tobacco: Never Used  . Alcohol Use: No   OB History   Grav Para Term Preterm Abortions TAB SAB Ect Mult Living                 Review of Systems  Constitutional: Negative for fever.  Respiratory: Negative for shortness of breath.   Cardiovascular: Negative for chest pain.   Gastrointestinal: Negative for nausea and vomiting.  Musculoskeletal: Positive for back pain.   A complete 10 system review of systems was obtained and all systems are negative except as noted in the HPI and PMH.     Allergies  Cefuroxime axetil and Ciprofloxacin  Home Medications   Current Outpatient Rx  Name  Route  Sig  Dispense  Refill  . amLODipine (NORVASC) 10 MG tablet      take 1 tablet by mouth once daily   30 tablet   5   . benzonatate (TESSALON) 100 MG capsule   Oral   Take 1 capsule (100 mg total) by mouth 2 (two) times daily as needed for cough.   20 capsule   0   . doxycycline (VIBRAMYCIN) 100 MG capsule   Oral   Take 1 capsule (100 mg total) by mouth 2 (two) times daily.   20 capsule   0   . ezetimibe-simvastatin (VYTORIN) 10-20 MG per tablet   Oral   Take 1 tablet by mouth at bedtime.         . fluticasone (FLONASE) 50 MCG/ACT nasal spray  Each Nare   Place 2 sprays into both nostrils daily.   16 g   6   . HYDROcodone-acetaminophen (NORCO) 10-325 MG per tablet      take 1 tablet by mouth every 8 hours if needed for pain   60 tablet   0   . HYDROcodone-acetaminophen (NORCO/VICODIN) 5-325 MG per tablet   Oral   Take 2 tablets by mouth every 4 (four) hours as needed.   10 tablet   0   . insulin glargine (LANTUS) 100 UNIT/ML injection   Subcutaneous   Inject 55 Units into the skin every evening.          . insulin glulisine (APIDRA) 100 UNIT/ML injection   Subcutaneous   Inject 10-40 Units into the skin 2 (two) times daily. 40  Units sq in am and 10 sq before dinner         . levothyroxine (SYNTHROID, LEVOTHROID) 25 MCG tablet   Oral   Take 25 mcg by mouth daily.          Marland Kitchen loratadine (CLARITIN) 10 MG tablet   Oral   Take 1 tablet (10 mg total) by mouth daily.   30 tablet   11   . LORazepam (ATIVAN) 0.5 MG tablet      take 1 tablet by mouth twice a day if needed for anxiety   60 tablet   1   . losartan (COZAAR) 100 MG  tablet      take 1 tablet by mouth once daily   30 tablet   5   . metoprolol tartrate (LOPRESSOR) 25 MG tablet      take 1 tablet by mouth twice a day   180 tablet   1   . ondansetron (ZOFRAN-ODT) 8 MG disintegrating tablet   Oral   Take 8 mg by mouth every 12 (twelve) hours as needed for nausea.         . pantoprazole (PROTONIX) 40 MG tablet   Oral   Take 40 mg by mouth daily.          . traMADol (ULTRAM) 50 MG tablet      take 2 tablets by mouth twice a day if needed   90 tablet   0    BP 158/69  Pulse 88  Temp(Src) 98.1 F (36.7 C) (Oral)  Resp 20  Ht _0  (1.6 m)  Wt 200 lb (90.719 kg)  BMI 35.44 kg/m2  SpO2 96% Physical Exam  Nursing note and vitals reviewed. Constitutional: She is oriented to person, place, and time. She appears well-developed and well-nourished. No distress.  HENT:  Head: Normocephalic and atraumatic.  Mouth/Throat: Oropharynx is clear and moist. No oropharyngeal exudate.  Eyes: Conjunctivae and EOM are normal. Pupils are equal, round, and reactive to light.  Neck: Normal range of motion. Neck supple. No tracheal deviation present.  Cardiovascular: Normal rate.   Pulmonary/Chest: Effort normal. No respiratory distress.  Abdominal: Soft. There is no tenderness. There is no rebound and no guarding.  Musculoskeletal: Normal range of motion. She exhibits no edema and no tenderness.  pustule to the proximal left lower arm with surrounding erythema. +2 radial pulse. Cardinal hand movements intact. 5/5 strength in bilateral lower extremities. Ankle plantar and dorsiflexion intact. Great toe extension intact bilaterally. +2 DP and PT pulses. +2 patellar reflexes bilaterally. Normal gait. Right paraspinal lower thoracic upper lumbar tenderness.  Neurological: She is alert and oriented to person, place, and time. No cranial nerve deficit. She  exhibits normal muscle tone. Coordination normal.  Skin: Skin is warm and dry.  Psychiatric: She has a  normal mood and affect. Her behavior is normal.    ED Course  INCISION AND DRAINAGE Date/Time: 03/02/2014 5:38 PM Performed by: Ezequiel Essex Authorized by: Ezequiel Essex Consent: Verbal consent obtained. Risks and benefits: risks, benefits and alternatives were discussed Consent given by: patient Patient understanding: patient states understanding of the procedure being performed Patient consent: the patient's understanding of the procedure matches consent given Procedure consent: procedure consent matches procedure scheduled Relevant documents: relevant documents present and verified Test results: test results available and properly labeled Required items: required blood products, implants, devices, and special equipment available Patient identity confirmed: verbally with patient and provided demographic data Time out: Immediately prior to procedure a "time out" was called to verify the correct patient, procedure, equipment, support staff and site/side marked as required. Type: abscess Body area: upper extremity Location details: left elbow Anesthesia: local infiltration Local anesthetic: lidocaine 1% without epinephrine Anesthetic total: 4 ml Patient sedated: no Scalpel size: 11 Incision type: single straight Complexity: complex Drainage: bloody Drainage amount: moderate Wound treatment: wound left open Patient tolerance: Patient tolerated the procedure well with no immediate complications.   (including critical care time) Medications  insulin aspart (novoLOG) injection 8 Units (8 Units Subcutaneous Given 03/02/14 1754)    DIAGNOSTIC STUDIES: Oxygen Saturation is 96% on RA, normal by my interpretation.    COORDINATION OF CARE: 4:34 PM- Discussed treatment plan with pt. Pt agrees to plan.    Labs Review Labs Reviewed  CBC WITH DIFFERENTIAL - Abnormal; Notable for the following:    RBC 5.13 (*)    Hemoglobin 15.7 (*)    Platelets 110 (*)    All other  components within normal limits  COMPREHENSIVE METABOLIC PANEL - Abnormal; Notable for the following:    Glucose, Bld 398 (*)    GFR calc non Af Amer 82 (*)    All other components within normal limits  CBG MONITORING, ED - Abnormal; Notable for the following:    Glucose-Capillary 332 (*)    All other components within normal limits  TROPONIN I   Imaging Review Dg Chest 2 View  03/02/2014   CLINICAL DATA:  Fall with back pain.  EXAM: CHEST - 2 VIEW  COMPARISON:  DG RIBS UNILATERAL W/CHEST*R* dated 05/23/2013; DG CHEST 2 VIEW dated 11/04/2012  FINDINGS: The heart size and mediastinal contours are within normal limits. There is no evidence of pulmonary edema, consolidation, pneumothorax, nodule or pleural fluid. The bony thorax shows osteopenia with no evidence of visible fracture of the thoracic spine.  IMPRESSION: No active disease.   Electronically Signed   By: Aletta Edouard M.D.   On: 03/02/2014 17:10     EKG Interpretation   Date/Time:  Monday March 02 2014 16:49:48 EDT Ventricular Rate:  78 PR Interval:  140 QRS Duration: 84 QT Interval:  368 QTC Calculation: 419 R Axis:   -10 Text Interpretation:  Normal sinus rhythm Cannot rule out Anterior infarct  , age undetermined Abnormal ECG No significant change was found Confirmed  by Wyvonnia Dusky  MD, [place] [phone]) on 03/02/2014 4:52:50 PM      MDM   Final diagnoses:  Cellulitis and abscess  Hyperglycemia   Patient reports one week of pain in her left arm and the proximal forearm without injury. Today she noticed a area of erythema and pustule. No fever or vomiting. Complains of worsening of her chronic back pain.  No focal weakness, numbness or tingling. No bowel or bladder incontinence.  EKG is unchanged. Patient has hyperglycemia without DKA. Incision and drainage performed as above. Her pain has been constant for one week it seems to be from cellulitis and abscess. Do not suspect cardiac etiology of chest pain.  Hyperglycemia  without evidence of DKA. Anion gap is 14. Thrombocytopenia is stable.   Patient will be discharged on antibiotics for early cellulitis. Instructed on soaking the site of her wound. Follow with PCP this week.   I personally performed the services described in this documentation, which was scribed in my presence. The recorded information has been reviewed and is accurate.   Ezequiel Essex, MD 03/02/14 1949

## 2014-03-02 NOTE — ED Notes (Signed)
Patient transported to X-ray via Biomedical scientist per tech.

## 2014-03-02 NOTE — ED Notes (Signed)
Pt reports L arm pain and several days ago noted she had some ? Abscesses where pain originated.  Also reports mechanical fall onto left side.

## 2014-03-02 NOTE — ED Notes (Signed)
Provider at bedside for I&D procedure.

## 2014-03-02 NOTE — Discharge Instructions (Signed)
Abscess Take your medications as prescribed. Followup for recheck of her wound with your doctor in 2 days. Return to the ED if you develop new or worsening symptoms. An abscess is an infected area that contains a collection of pus and debris.It can occur in almost any part of the body. An abscess is also known as a furuncle or boil. CAUSES  An abscess occurs when tissue gets infected. This can occur from blockage of oil or sweat glands, infection of hair follicles, or a minor injury to the skin. As the body tries to fight the infection, pus collects in the area and creates pressure under the skin. This pressure causes pain. People with weakened immune systems have difficulty fighting infections and get certain abscesses more often.  SYMPTOMS Usually an abscess develops on the skin and becomes a painful mass that is red, warm, and tender. If the abscess forms under the skin, you may feel a moveable soft area under the skin. Some abscesses break open (rupture) on their own, but most will continue to get worse without care. The infection can spread deeper into the body and eventually into the bloodstream, causing you to feel ill.  DIAGNOSIS  Your caregiver will take your medical history and perform a physical exam. A sample of fluid may also be taken from the abscess to determine what is causing your infection. TREATMENT  Your caregiver may prescribe antibiotic medicines to fight the infection. However, taking antibiotics alone usually does not cure an abscess. Your caregiver may need to make a small cut (incision) in the abscess to drain the pus. In some cases, gauze is packed into the abscess to reduce pain and to continue draining the area. HOME CARE INSTRUCTIONS   Only take over-the-counter or prescription medicines for pain, discomfort, or fever as directed by your caregiver.  If you were prescribed antibiotics, take them as directed. Finish them even if you start to feel better.  If gauze is  used, follow your caregiver's directions for changing the gauze.  To avoid spreading the infection:  Keep your draining abscess covered with a bandage.  Wash your hands well.  Do not share personal care items, towels, or whirlpools with others.  Avoid skin contact with others.  Keep your skin and clothes clean around the abscess.  Keep all follow-up appointments as directed by your caregiver. SEEK MEDICAL CARE IF:   You have increased pain, swelling, redness, fluid drainage, or bleeding.  You have muscle aches, chills, or a general ill feeling.  You have a fever. MAKE SURE YOU:   Understand these instructions.  Will watch your condition.  Will get help right away if you are not doing well or get worse. Document Released: 08/30/2005 Document Revised: 05/21/2012 Document Reviewed: 02/02/2012 [facility] Patient Information 2014 [place].

## 2014-03-05 ENCOUNTER — Telehealth: Payer: Self-pay | Admitting: Family Medicine

## 2014-03-05 DIAGNOSIS — G894 Chronic pain syndrome: Secondary | ICD-10-CM

## 2014-03-05 MED ORDER — HYDROCODONE-ACETAMINOPHEN 10-325 MG PO TABS: ORAL_TABLET | ORAL | Status: DC

## 2014-03-05 NOTE — Telephone Encounter (Signed)
Patient is requesting Hydrocodone Last seen 12/05/13 and filled  filled 02/02/14.  UDS 05/26/13  Please advise KP

## 2014-03-05 NOTE — Telephone Encounter (Signed)
Patient called and asked to speak to [facility]. I stated that she is seeing patient's right now. Patient did not want to give me any details but to have Kim call her back. Please advise.

## 2014-03-05 NOTE — Telephone Encounter (Signed)
Patient aware Rx ready for pick up.     KP

## 2014-03-05 NOTE — Telephone Encounter (Signed)
[place] for [employer]

## 2014-03-16 ENCOUNTER — Telehealth: Payer: Self-pay | Admitting: Hematology & Oncology

## 2014-03-16 NOTE — Telephone Encounter (Signed)
Pt called wanted appointment. I sent to RN for triage. RN transferred her back to schedule in may. Pt made 6-5, I offered her numerous dates she didn't think it was urgent since she had blood drawn 2 weeks ago per RN. RN aware

## 2014-03-31 DIAGNOSIS — E785 Hyperlipidemia, unspecified: Secondary | ICD-10-CM | POA: Diagnosis not present

## 2014-03-31 DIAGNOSIS — E039 Hypothyroidism, unspecified: Secondary | ICD-10-CM | POA: Diagnosis not present

## 2014-03-31 DIAGNOSIS — E1149 Type 2 diabetes mellitus with other diabetic neurological complication: Secondary | ICD-10-CM | POA: Diagnosis not present

## 2014-03-31 DIAGNOSIS — G609 Hereditary and idiopathic neuropathy, unspecified: Secondary | ICD-10-CM | POA: Diagnosis not present

## 2014-03-31 DIAGNOSIS — Z794 Long term (current) use of insulin: Secondary | ICD-10-CM | POA: Diagnosis not present

## 2014-04-01 ENCOUNTER — Telehealth: Payer: Self-pay | Admitting: Family Medicine

## 2014-04-01 DIAGNOSIS — G894 Chronic pain syndrome: Secondary | ICD-10-CM

## 2014-04-01 MED ORDER — HYDROCODONE-ACETAMINOPHEN 10-325 MG PO TABS: ORAL_TABLET | ORAL | Status: DC

## 2014-04-01 NOTE — Addendum Note (Signed)
Addended by: Ewing Schlein on: 04/01/2014 02:04 PM   Modules accepted: Orders

## 2014-04-01 NOTE — Telephone Encounter (Signed)
Refill x1

## 2014-04-01 NOTE — Telephone Encounter (Signed)
Last seen 12/05/13 and filled filled 03/05/14 #60 UDS 05/26/13   Please advise KP

## 2014-04-01 NOTE — Telephone Encounter (Addendum)
Patient is aware Rx will be ready for pick up tomorrow and has been advised again to call 24 hours in advanced prior to running out of medications. She voiced understanding and agreed to pick up med's in the morning.     KP

## 2014-04-01 NOTE — Telephone Encounter (Signed)
Caller name: Aashvi Relation to pt: Call back [place]:  Reason for call: pt is wanting a refill on HYDROcodone-acetaminophen (NORCO) 10-325 MG per tablet.

## 2014-04-01 NOTE — Telephone Encounter (Signed)
Patient called again to see if this could be done today before Dr Etter Sjogren leaves. Please advise.

## 2014-04-01 NOTE — Telephone Encounter (Signed)
Patient called again.  Informed pt that we will contact her when it is ready for pick up.

## 2014-04-15 ENCOUNTER — Other Ambulatory Visit: Payer: Self-pay | Admitting: Family Medicine

## 2014-04-15 ENCOUNTER — Encounter: Payer: Self-pay | Admitting: Physician Assistant

## 2014-04-15 ENCOUNTER — Ambulatory Visit (INDEPENDENT_AMBULATORY_CARE_PROVIDER_SITE_OTHER): Payer: Medicare Other | Admitting: Physician Assistant

## 2014-04-15 VITALS — BP 136/82 | HR 88 | Temp 98.8°F | Resp 16 | Ht 63.0 in | Wt 204.0 lb

## 2014-04-15 DIAGNOSIS — K047 Periapical abscess without sinus: Secondary | ICD-10-CM

## 2014-04-15 MED ORDER — ONDANSETRON 8 MG PO TBDP: 8.0000 mg | ORAL_TABLET | Freq: Two times a day (BID) | ORAL | Status: DC | PRN

## 2014-04-15 MED ORDER — AMOXICILLIN 500 MG PO CAPS: 500.0000 mg | ORAL_CAPSULE | Freq: Three times a day (TID) | ORAL | Status: DC

## 2014-04-15 NOTE — Assessment & Plan Note (Signed)
Concern for dental abscess. Rx amoxicillin 500 mg 3 times a day x10 days. Patient with prescription for Norco. Take as directed for pain. Avoid eating unaffected side. Urgent referral to oral surgeon placed. We'll attempt to find her a Psychologist, sport and exercise who will work with her financially. Patient instructed that if symptoms acutely worsen or she develops fever or chills, she is to proceed to the ER.

## 2014-04-15 NOTE — Progress Notes (Signed)
Patient presents to clinic today c/o several week of right sided tooth pain with gingival swelling. Patient has history of dental abscess. Patient denies fever, chills, gingival bleeding. She endorses some radiation of pain into her ear.  Denies trauma to teeth or gingiva. Patient states she does not have dental insurance. Is requesting help in finding a dentist or oral surgeon.  Past Medical History  Diagnosis Date  . Asthma   . Diabetes mellitus   . Osteopenia   . Renal cell carcinoma   . Goiter   . Hypertension   . Hyperlipidemia   . Blood transfusion     Current Outpatient Prescriptions on File Prior to Visit  Medication Sig Dispense Refill  . amLODipine (NORVASC) 10 MG tablet take 1 tablet by mouth once daily  30 tablet  5  . ezetimibe-simvastatin (VYTORIN) 10-20 MG per tablet Take 1 tablet by mouth at bedtime.      Marland Kitchen HYDROcodone-acetaminophen (NORCO) 10-325 MG per tablet take 1 tablet by mouth every 8 hours if needed for pain  60 tablet  0  . insulin glargine (LANTUS) 100 UNIT/ML injection Inject 55 Units into the skin every evening.       . insulin glulisine (APIDRA) 100 UNIT/ML injection Inject 10-40 Units into the skin 2 (two) times daily. 40  Units sq in am and 10 sq before dinner      . levothyroxine (SYNTHROID, LEVOTHROID) 25 MCG tablet Take 25 mcg by mouth daily.       Marland Kitchen LORazepam (ATIVAN) 0.5 MG tablet take 1 tablet by mouth twice a day if needed for anxiety  60 tablet  1  . losartan (COZAAR) 100 MG tablet take 1 tablet by mouth once daily  30 tablet  5  . metoprolol tartrate (LOPRESSOR) 25 MG tablet take 1 tablet by mouth twice a day  180 tablet  1  . pantoprazole (PROTONIX) 40 MG tablet Take 40 mg by mouth daily.       . traMADol (ULTRAM) 50 MG tablet take 2 tablets by mouth twice a day if needed  90 tablet  0   No current facility-administered medications on file prior to visit.    Allergies  Allergen Reactions  . Cefuroxime Axetil     Upset stomach   .  Ciprofloxacin     Upset stomach    Family History  Problem Relation Age of Onset  . Cancer Sister     bladder  . Kidney cancer Father   . Cancer Father     renal  . Coronary artery disease    . Diabetes    . Hyperlipidemia    . Hypertension      History   Social History  . Marital Status: Married    Spouse Name: N/A    Number of Children: N/A  . Years of Education: N/A   Social History Main Topics  . Smoking status: Former Research scientist (life sciences)  . Smokeless tobacco: Never Used  . Alcohol Use: No  . Drug Use: No  . Sexual Activity: None   Other Topics Concern  . None   Social History Narrative  . None    Review of Systems - See HPI.  All other ROS are negative.  BP 136/82  Pulse 88  Temp(Src) 98.8 F (37.1 C) (Oral)  Resp 16  Ht _0  (1.6 m)  Wt 204 lb (92.534 kg)  BMI 36.15 kg/m2  SpO2 97%  Physical Exam  Vitals reviewed. Constitutional: She is oriented to person,  place, and time and well-developed, well-nourished, and in no distress.  HENT:  Head: Normocephalic and atraumatic.  Right Ear: Tympanic membrane, external ear and ear canal normal.  Left Ear: Tympanic membrane, external ear and ear canal normal.  Nose: Nose normal. Right sinus exhibits no maxillary sinus tenderness and no frontal sinus tenderness. Left sinus exhibits no maxillary sinus tenderness and no frontal sinus tenderness.  Mouth/Throat: Uvula is midline, oropharynx is clear and moist and mucous membranes are normal. Abnormal dentition. Dental caries present. No oropharyngeal exudate, posterior oropharyngeal edema, posterior oropharyngeal erythema or tonsillar abscesses.    Eyes: Conjunctivae are normal. Pupils are equal, round, and reactive to light.  Neck: Neck supple.  Cardiovascular: Normal rate, regular rhythm, normal heart sounds and intact distal pulses.   Pulmonary/Chest: Effort normal and breath sounds normal. No respiratory distress. She has no wheezes. She has no rales. She exhibits no  tenderness.  Lymphadenopathy:    She has no cervical adenopathy.  Neurological: She is alert and oriented to person, place, and time.  Skin: Skin is warm and dry. No rash noted.  Psychiatric: Affect normal.    Recent Results (from the past 2160 hour(s))  CBC WITH DIFFERENTIAL     Status: Abnormal   Collection Time    03/02/14  4:50 PM      Result Value Ref Range   WBC 7.9  4.0 - 10.5 K/uL   RBC 5.13 (*) 3.87 - 5.11 MIL/uL   Hemoglobin 15.7 (*) 12.0 - 15.0 g/dL   HCT 44.4  36.0 - 46.0 %   MCV 86.5  78.0 - 100.0 fL   MCH 30.6  26.0 - 34.0 pg   MCHC 35.4  30.0 - 36.0 g/dL   RDW 13.7  11.5 - 15.5 %   Platelets 110 (*) 150 - 400 K/uL   Comment: PLATELET COUNT CONFIRMED BY SMEAR     PLATELETS APPEAR ADEQUATE   Neutrophils Relative % 65  43 - 77 %   Lymphocytes Relative 22  12 - 46 %   Monocytes Relative 10  3 - 12 %   Eosinophils Relative 2  0 - 5 %   Basophils Relative 1  0 - 1 %   Neutro Abs 5.1  1.7 - 7.7 K/uL   Lymphs Abs 1.7  0.7 - 4.0 K/uL   Monocytes Absolute 0.8  0.1 - 1.0 K/uL   Eosinophils Absolute 0.2  0.0 - 0.7 K/uL   Basophils Absolute 0.1  0.0 - 0.1 K/uL   Smear Review PLATELET COUNT CONFIRMED BY SMEAR     Comment: PLATELETS APPEAR ADEQUATE  COMPREHENSIVE METABOLIC PANEL     Status: Abnormal   Collection Time    03/02/14  4:50 PM      Result Value Ref Range   Sodium 137  137 - 147 mEq/L   Potassium 4.7  3.7 - 5.3 mEq/L   Chloride 99  96 - 112 mEq/L   CO2 24  19 - 32 mEq/L   Glucose, Bld 398 (*) 70 - 99 mg/dL   BUN 23  6 - 23 mg/dL   Creatinine, Ser 0.70  0.50 - 1.10 mg/dL   Calcium 9.5  8.4 - 10.5 mg/dL   Total Protein 7.3  6.0 - 8.3 g/dL   Albumin 3.6  3.5 - 5.2 g/dL   AST 28  0 - 37 U/L   ALT 31  0 - 35 U/L   Alkaline Phosphatase 100  39 - 117 U/L   Total  Bilirubin 0.6  0.3 - 1.2 mg/dL   GFR calc non Af Amer 82 (*) >90 mL/min   GFR calc Af Amer >90  >90 mL/min   Comment: (NOTE)     The eGFR has been calculated using the CKD EPI equation.     This  calculation has not been validated in all clinical situations.     eGFR's persistently <90 mL/min signify possible Chronic Kidney     Disease.  TROPONIN I     Status: None   Collection Time    03/02/14  4:50 PM      Result Value Ref Range   Troponin I <0.30  <0.30 ng/mL   Comment:            Due to the release kinetics of cTnI,     a negative result within the first hours     of the onset of symptoms does not rule out     myocardial infarction with certainty.     If myocardial infarction is still suspected,     repeat the test at appropriate intervals.  CBG MONITORING, ED     Status: Abnormal   Collection Time    03/02/14  6:21 PM      Result Value Ref Range   Glucose-Capillary 332 (*) 70 - 99 mg/dL   Comment 1 Notify RN     Comment 2 Documented in Chart      Assessment/Plan: Dental abscess Concern for dental abscess. Rx amoxicillin 500 mg 3 times a day x10 days. Patient with prescription for Norco. Take as directed for pain. Avoid eating unaffected side. Urgent referral to oral surgeon placed. We'll attempt to find her a Psychologist, sport and exercise who will work with her financially. Patient instructed that if symptoms acutely worsen or she develops fever or chills, she is to proceed to the ER.

## 2014-04-15 NOTE — Progress Notes (Signed)
Pre visit review using our clinic review tool, if applicable. No additional management support is needed unless otherwise documented below in the visit note/SLS  

## 2014-04-15 NOTE — Patient Instructions (Signed)
Please take antibiotic as directed with food.  Increase fluid intake.  Maintain good oral hygiene.  Take Tramadol for pain.  You will be contacted by Oral Surgeon for treatment.  I have made our patient care coordinator aware that you have no insurance, so we will find a surgeon who will take on your case and work with you payment-wise.  If you develop severe pain, fever or chills, please proceed to the ER, as IV antibiotics may be needed.

## 2014-04-16 NOTE — Telephone Encounter (Signed)
Last 12/05/13 and filled 02/13/14 #60 with 1 refill.   Please advise     KP

## 2014-04-29 ENCOUNTER — Telehealth: Payer: Self-pay | Admitting: Family Medicine

## 2014-04-29 DIAGNOSIS — G894 Chronic pain syndrome: Secondary | ICD-10-CM

## 2014-04-29 MED ORDER — HYDROCODONE-ACETAMINOPHEN 10-325 MG PO TABS: ORAL_TABLET | ORAL | Status: DC

## 2014-04-29 NOTE — Telephone Encounter (Signed)
Last seen 12/05/13 and filled 04/01/14 #60 UDS 05/26/13     Please advise KP

## 2014-04-29 NOTE — Telephone Encounter (Signed)
Refill x1 

## 2014-04-29 NOTE — Telephone Encounter (Signed)
Caller name: Margaret Dyer Relation to pt: patient Call back number: [phone] Pharmacy:  Reason for call: to request a refill for hydrocodone. Patient states that she knows its to early but she had to take extra pills due to her tooth abscess. Please advise.

## 2014-04-29 NOTE — Telephone Encounter (Signed)
Patient has been made aware Rx ready for pick up tomorrow.     KP

## 2014-05-08 ENCOUNTER — Ambulatory Visit (HOSPITAL_BASED_OUTPATIENT_CLINIC_OR_DEPARTMENT_OTHER): Payer: Medicare Other | Admitting: Hematology & Oncology

## 2014-05-08 ENCOUNTER — Other Ambulatory Visit (HOSPITAL_BASED_OUTPATIENT_CLINIC_OR_DEPARTMENT_OTHER): Payer: Medicare Other | Admitting: Lab

## 2014-05-08 ENCOUNTER — Telehealth: Payer: Self-pay | Admitting: Hematology & Oncology

## 2014-05-08 ENCOUNTER — Encounter: Payer: Self-pay | Admitting: Hematology & Oncology

## 2014-05-08 VITALS — BP 133/60 | HR 73 | Temp 97.9°F | Resp 14 | Ht 63.0 in | Wt 204.0 lb

## 2014-05-08 DIAGNOSIS — R5383 Other fatigue: Secondary | ICD-10-CM

## 2014-05-08 DIAGNOSIS — K7460 Unspecified cirrhosis of liver: Secondary | ICD-10-CM

## 2014-05-08 DIAGNOSIS — E559 Vitamin D deficiency, unspecified: Secondary | ICD-10-CM | POA: Diagnosis not present

## 2014-05-08 DIAGNOSIS — K7689 Other specified diseases of liver: Secondary | ICD-10-CM | POA: Diagnosis not present

## 2014-05-08 DIAGNOSIS — D696 Thrombocytopenia, unspecified: Secondary | ICD-10-CM | POA: Diagnosis not present

## 2014-05-08 DIAGNOSIS — E538 Deficiency of other specified B group vitamins: Secondary | ICD-10-CM | POA: Diagnosis not present

## 2014-05-08 DIAGNOSIS — R161 Splenomegaly, not elsewhere classified: Secondary | ICD-10-CM

## 2014-05-08 DIAGNOSIS — D500 Iron deficiency anemia secondary to blood loss (chronic): Secondary | ICD-10-CM

## 2014-05-08 LAB — CBC WITH DIFFERENTIAL (CANCER CENTER ONLY)
BASO#: 0 10*3/uL (ref 0.0–0.2)
BASO%: 0.7 % (ref 0.0–2.0)
EOS%: 1.4 % (ref 0.0–7.0)
Eosinophils Absolute: 0.1 10*3/uL (ref 0.0–0.5)
HEMATOCRIT: 43.9 % (ref 34.8–46.6)
HGB: 15.3 g/dL (ref 11.6–15.9)
LYMPH#: 1.4 10*3/uL (ref 0.9–3.3)
LYMPH%: 24.4 % (ref 14.0–48.0)
MCH: 30.7 pg (ref 26.0–34.0)
MCHC: 34.9 g/dL (ref 32.0–36.0)
MCV: 88 fL (ref 81–101)
MONO#: 0.6 10*3/uL (ref 0.1–0.9)
MONO%: 9.6 % (ref 0.0–13.0)
NEUT#: 3.7 10*3/uL (ref 1.5–6.5)
NEUT%: 63.9 % (ref 39.6–80.0)
Platelets: 103 10*3/uL — ABNORMAL LOW (ref 145–400)
RBC: 4.98 10*6/uL (ref 3.70–5.32)
RDW: 13.1 % (ref 11.1–15.7)
WBC: 5.8 10*3/uL (ref 3.9–10.0)

## 2014-05-08 LAB — CHCC SATELLITE - SMEAR

## 2014-05-08 NOTE — Telephone Encounter (Signed)
Mailed august schedule

## 2014-05-09 LAB — VITAMIN D 25 HYDROXY (VIT D DEFICIENCY, FRACTURES): VIT D 25 HYDROXY: 21 ng/mL — AB (ref 30–89)

## 2014-05-09 LAB — VITAMIN B12: Vitamin B-12: 445 pg/mL (ref 211–911)

## 2014-05-10 NOTE — Progress Notes (Signed)
Hematology and Oncology Follow Up Visit  Margaret Dyer [id number] 03-Mar-1935 79 y.o. 05/10/2014   Principle Diagnosis:   Thrombocytopenia  Cirrhosis -NASH with splenomegaly  Current Therapy:    Observation     Interim History:  Margaret Dyer is back for followup. We first saw her back in August of last year. She has some pancytopenia. She has been asymptomatic with this.  She does have cirrhosis. She diabetes. I suspect that she has NASH. She has splenomegaly to the cirrhosis.  Our workup on her so far really has been unremarkable for anything other than the cirrhosis and splenomegaly.  We did check her B12 levels. They were okay today at 445. She does have  a low vitamin D level.  She did have arterial Dopplers of her legs back in February and these were all normal.  There's been no change in her medications. She does have chronic pain. Medications: Current outpatient prescriptions:amLODipine (NORVASC) 10 MG tablet, take 1 tablet by mouth once daily, Disp: 30 tablet, Rfl: 5;  amoxicillin (AMOXIL) 500 MG capsule, Take 500 mg by mouth 3 (three) times daily., Disp: , Rfl: ;  ezetimibe-simvastatin (VYTORIN) 10-20 MG per tablet, Take 1 tablet by mouth at bedtime., Disp: , Rfl:  HYDROcodone-acetaminophen (NORCO) 10-325 MG per tablet, take 1 tablet by mouth every 8 hours if needed for pain, Disp: 60 tablet, Rfl: 0;  insulin glargine (LANTUS) 100 UNIT/ML injection, Inject 55 Units into the skin every evening. , Disp: , Rfl: ;  insulin glulisine (APIDRA) 100 UNIT/ML injection, Inject 10-40 Units into the skin 2 (two) times daily. 35 Units sq in am and 20-30 sq before dinner, Disp: , Rfl:  levothyroxine (SYNTHROID, LEVOTHROID) 25 MCG tablet, Take 25 mcg by mouth daily. , Disp: , Rfl: ;  LORazepam (ATIVAN) 0.5 MG tablet, take 1 tablet by mouth twice a day if needed for anxiety, Disp: 60 tablet, Rfl: 1;  losartan (COZAAR) 100 MG tablet, take 1 tablet by mouth once daily, Disp: 30 tablet, Rfl: 5;   metoprolol tartrate (LOPRESSOR) 25 MG tablet, take 1 tablet by mouth twice a day, Disp: 180 tablet, Rfl: 1 ondansetron (ZOFRAN-ODT) 8 MG disintegrating tablet, Take 1 tablet (8 mg total) by mouth every 12 (twelve) hours as needed for nausea., Disp: 20 tablet, Rfl: 0;  pantoprazole (PROTONIX) 40 MG tablet, Take 40 mg by mouth daily. , Disp: , Rfl: ;  traMADol (ULTRAM) 50 MG tablet, take 2 tablets by mouth twice a day if needed, Disp: 90 tablet, Rfl: 0  Allergies:  Allergies  Allergen Reactions  . Cefuroxime Axetil     Upset stomach   . Ciprofloxacin     Upset stomach    Past Medical History, Surgical history, Social history, and Family History were reviewed and updated.  Review of Systems: As above  Physical Exam:  height is _0  (1.6 m) and weight is 204 lb (92.534 kg). Her oral temperature is 97.9 F (36.6 C). Her blood pressure is 133/60 and her pulse is 73. Her respiration is 14.   Somewhat obese white female. Her head and neck exam shows no ocular or oral lesion. She has no scleral icterus. There is no adenopathy in the neck. Lungs are clear. Cardiac exam regular in rhythm with no murmurs rubs or bruits. Abdomen soft. She has good bowel sounds. There is no fluid wave. I cannot palpate a liver edge. Her spleen to might be palpable with deep inspiration. Extremities shows some trace edema. She is in  chronic diabetic changes. She is decent strength. Skin exam no petechia, ecchymosis or rashes. Neurological exam is non-focal.  Lab Results  Component Value Date   WBC 5.8 05/08/2014   HGB 15.3 05/08/2014   HCT 43.9 05/08/2014   MCV 88 05/08/2014   PLT 103 Platelet count consistent in citrate* 05/08/2014     Chemistry      Component Value Date/Time   NA 137 03/02/2014 1650   K 4.7 03/02/2014 1650   CL 99 03/02/2014 1650   CO2 24 03/02/2014 1650   BUN 23 03/02/2014 1650   CREATININE 0.70 03/02/2014 1650      Component Value Date/Time   CALCIUM 9.5 03/02/2014 1650   ALKPHOS 100 03/02/2014 1650    AST 28 03/02/2014 1650   ALT 31 03/02/2014 1650   BILITOT 0.6 03/02/2014 1650         Impression and Plan: Margaret Dyer is 79 year old white female with thrombocytopenia. Her platelet count is pretty much holding  steady right now.   Her blood smear looks okay. I don't see any immature red cells. There are no nucleated red blood cells. I see no immature myeloid cells. There are no hypersegmented polys. Platelets are mildly decreased in number but well granulated. She has several large platelets.  I do not see any indication for a bone marrow test. Again I suspect that all this is from her having cirrhosis with splenomegaly. The spleen is act as a "sponge" and drop in her platelets. Her platelets are very functional. She should never have any bleeding issues one is she has varices that might bleed.  For now, we'll just plan to get her back to see [place] in another few months.  I still do not see any indication for her to have additional studies. Volanda Napoleon, MD 6/7/20151:28 PM

## 2014-05-11 ENCOUNTER — Other Ambulatory Visit: Payer: Self-pay | Admitting: Family

## 2014-05-11 LAB — IRON AND TIBC CHCC
%SAT: 40 % (ref 21–57)
Iron: 133 ug/dL (ref 41–142)
TIBC: 329 ug/dL (ref 236–444)
UIBC: 196 ug/dL (ref 120–384)

## 2014-05-11 LAB — FERRITIN CHCC: FERRITIN: 72 ng/mL (ref 9–269)

## 2014-05-11 LAB — TSH CHCC: TSH: 0.559 m(IU)/L (ref 0.308–3.960)

## 2014-05-12 ENCOUNTER — Telehealth: Payer: Self-pay | Admitting: *Deleted

## 2014-05-12 NOTE — Telephone Encounter (Addendum)
Message copied by Lenn Sink on Tue May 12, 2014  2:16 PM ------      Message from: Burney Gauze R      Created: Mon May 11, 2014  9:03 PM       Call - all labs look ok!!  Laurey Arrow ------Left voicemail informing pt that labs are okay

## 2014-05-26 ENCOUNTER — Telehealth: Payer: Self-pay | Admitting: Family Medicine

## 2014-05-26 DIAGNOSIS — G894 Chronic pain syndrome: Secondary | ICD-10-CM

## 2014-05-26 MED ORDER — HYDROCODONE-ACETAMINOPHEN 10-325 MG PO TABS: ORAL_TABLET | ORAL | Status: DC

## 2014-05-26 NOTE — Telephone Encounter (Signed)
Refill x1

## 2014-05-26 NOTE — Telephone Encounter (Signed)
Caller name: Lyndsay Talamante Relation to pt: patient Call back [place]:  Reason for call: to request a refill for hydrocodone

## 2014-05-26 NOTE — Telephone Encounter (Signed)
Patient aware Rx ready for pick up after 2 pm today.    KP

## 2014-05-26 NOTE — Telephone Encounter (Signed)
Last seen 12/05/13 and filled 04/29/14 #60. UDS 05/26/13    Please advise     KP

## 2014-05-30 ENCOUNTER — Other Ambulatory Visit: Payer: Self-pay | Admitting: Family Medicine

## 2014-06-11 ENCOUNTER — Other Ambulatory Visit: Payer: Self-pay | Admitting: Family Medicine

## 2014-06-11 NOTE — Telephone Encounter (Signed)
Patient is requesting a refill on Ativan Last office visit 12/05/13 Last filled 12/05/13 #60 with 1 RF UDS 12/2013 low risk  Lowne ot Okay to refill?

## 2014-06-22 ENCOUNTER — Telehealth: Payer: Self-pay | Admitting: Family Medicine

## 2014-06-22 DIAGNOSIS — G894 Chronic pain syndrome: Secondary | ICD-10-CM

## 2014-06-22 NOTE — Telephone Encounter (Signed)
Caller name: Kassity Woodson Relation to pt: patient Call back number: [phone] Pharmacy:  Reason for call: to request a refill for Hydrocodone

## 2014-06-23 MED ORDER — HYDROCODONE-ACETAMINOPHEN 10-325 MG PO TABS: ORAL_TABLET | ORAL | Status: DC

## 2014-06-23 NOTE — Telephone Encounter (Signed)
Requesting Hydrocodone 10-341m-Take 1 tablet by mouth every 8 hours if needed for pain. Last refill:05/26/14;#60,0 Last OV:12/05/13 UDS:12/10/13-Low Please advise.//AB/CMA

## 2014-06-23 NOTE — Telephone Encounter (Signed)
Pt notified.

## 2014-06-23 NOTE — Telephone Encounter (Signed)
done

## 2014-07-07 ENCOUNTER — Telehealth: Payer: Self-pay | Admitting: Hematology & Oncology

## 2014-07-07 NOTE — Telephone Encounter (Signed)
Patient called and cx 07/10/14 apt and stated she will call back to resch

## 2014-07-09 ENCOUNTER — Telehealth: Payer: Self-pay | Admitting: Family Medicine

## 2014-07-09 NOTE — Telephone Encounter (Signed)
Detailed message left on VM advising the patient she will need an appointment.     KP

## 2014-07-09 NOTE — Telephone Encounter (Signed)
Caller name: [name]  Call back [place]:    Reason for call:  Leave message on machine, not with husband, he has alzheimer's.   -- pt wants a script for generic of Rx fluconazole (DIFLUCAN) 200 MG tablet .  Pt states she is having a UTI.

## 2014-07-10 ENCOUNTER — Other Ambulatory Visit: Payer: Medicare Other | Admitting: Lab

## 2014-07-10 ENCOUNTER — Ambulatory Visit: Payer: Medicare Other | Admitting: Hematology & Oncology

## 2014-07-21 ENCOUNTER — Telehealth: Payer: Self-pay | Admitting: Family Medicine

## 2014-07-21 DIAGNOSIS — G894 Chronic pain syndrome: Secondary | ICD-10-CM

## 2014-07-21 NOTE — Telephone Encounter (Signed)
Caller name: Eleora  Relation to pt: self  Call back number: [phone] Pharmacy: [facility] [phone]  Reason for call: pt requesting refill HYDROcodone-acetaminophen (NORCO) 10-325 MG per tablet

## 2014-07-21 NOTE — Telephone Encounter (Signed)
Last seen 12/05/13 and filled 06/23/14 #60 UDS 05/26/13 Low risk   Please advise     KP

## 2014-07-21 NOTE — Telephone Encounter (Signed)
Refill x1 

## 2014-07-22 ENCOUNTER — Telehealth: Payer: Self-pay

## 2014-07-22 MED ORDER — HYDROCODONE-ACETAMINOPHEN 10-325 MG PO TABS: ORAL_TABLET | ORAL | Status: DC

## 2014-07-22 NOTE — Telephone Encounter (Signed)
Renewal form brought into clinic today.  Form completed and placed in Dr. Nonda Lou red folder for review and signature.

## 2014-07-22 NOTE — Telephone Encounter (Signed)
Rx printed and left at check in, patient made aware.     KP

## 2014-07-27 NOTE — Telephone Encounter (Signed)
Received signed Disability Parking Placard from Dr. Etter Sjogren.   Called the pt and informed pt that form signed and ready.  Asked the pt if she would like to pick up the form or would she like for me to mail it to her.  Pt asked if form would be mailed to her.  Form mailed.//AB/CMA

## 2014-07-28 DIAGNOSIS — E039 Hypothyroidism, unspecified: Secondary | ICD-10-CM | POA: Diagnosis not present

## 2014-07-28 DIAGNOSIS — IMO0002 Reserved for concepts with insufficient information to code with codable children: Secondary | ICD-10-CM | POA: Diagnosis not present

## 2014-07-28 DIAGNOSIS — Z794 Long term (current) use of insulin: Secondary | ICD-10-CM | POA: Diagnosis not present

## 2014-07-28 DIAGNOSIS — G609 Hereditary and idiopathic neuropathy, unspecified: Secondary | ICD-10-CM | POA: Diagnosis not present

## 2014-07-28 DIAGNOSIS — E785 Hyperlipidemia, unspecified: Secondary | ICD-10-CM | POA: Diagnosis not present

## 2014-07-28 DIAGNOSIS — E1165 Type 2 diabetes mellitus with hyperglycemia: Secondary | ICD-10-CM | POA: Diagnosis not present

## 2014-07-28 DIAGNOSIS — E1149 Type 2 diabetes mellitus with other diabetic neurological complication: Secondary | ICD-10-CM | POA: Diagnosis not present

## 2014-08-04 ENCOUNTER — Telehealth: Payer: Self-pay | Admitting: Family Medicine

## 2014-08-04 MED ORDER — AMLODIPINE BESYLATE 10 MG PO TABS: ORAL_TABLET | ORAL | Status: DC

## 2014-08-04 NOTE — Telephone Encounter (Signed)
Caller name: Sullivan  Call back number:  [phone] Pharmacy:  [facility]  Reason for call:  Pt is needing the Rx amLODipine (NORVASC) 10 MG tablet .  Pt states pharmacy called 2 times.

## 2014-08-04 NOTE — Telephone Encounter (Signed)
Rx faxed    KP

## 2014-08-17 ENCOUNTER — Telehealth: Payer: Self-pay | Admitting: Family Medicine

## 2014-08-17 DIAGNOSIS — G894 Chronic pain syndrome: Secondary | ICD-10-CM

## 2014-08-17 MED ORDER — HYDROCODONE-ACETAMINOPHEN 10-325 MG PO TABS: ORAL_TABLET | ORAL | Status: DC

## 2014-08-17 NOTE — Telephone Encounter (Signed)
Ok to refill x1

## 2014-08-17 NOTE — Telephone Encounter (Signed)
Caller name: Donnamaria Relation to pt: self Call back number: [phone] Pharmacy:  Reason for call:   Patient is requesting a new hydrocodone rx

## 2014-08-17 NOTE — Telephone Encounter (Signed)
Patient aware Rx ready for pick up.     KP

## 2014-08-17 NOTE — Telephone Encounter (Signed)
Last seen 12/05/13 and filled 07/22/14 #60.   UDS 05/26/13  Please advise      KP

## 2014-08-26 ENCOUNTER — Other Ambulatory Visit: Payer: Self-pay | Admitting: Family Medicine

## 2014-08-26 ENCOUNTER — Telehealth: Payer: Self-pay

## 2014-08-26 NOTE — Telephone Encounter (Signed)
Medication:  LORazepam (ATIVAN) 0.5 MG tablet- take 1 tablet by mouth twice a day if needed for anxiety  Last OV:  12/05/13 with Dr. Etter Sjogren Last refill:  06/11/14 Amt: 60 tablets, 1 refill Contract on file UDS 12/10/13---Low risk   Please advise.

## 2014-08-26 NOTE — Telephone Encounter (Signed)
Margaret Dyer Self [phone]  Vaughan Basta called and she would like to use [facility] for her HYDROcodone-acetaminophen ([place]) 10-325 MG per tablet because they are $8 cheaper, she also wanted to know if [facility] could use [facility]  She also said she needs her LORazepam (ATIVAN) 0.5 MG tablet

## 2014-08-27 MED ORDER — LORAZEPAM 0.5 MG PO TABS: ORAL_TABLET | ORAL | Status: DC

## 2014-08-27 NOTE — Telephone Encounter (Signed)
Refill x1 

## 2014-08-27 NOTE — Telephone Encounter (Signed)
Last seen 04/15/14 and Lorazepam filled 06/11/14 #60 with 1 refill. Please advise      KP

## 2014-08-27 NOTE — Telephone Encounter (Signed)
Caller name: Rosiland  Relation to pt: self Call back number:[phone] Pharmacy:  Reason for call: pt would like to know the status of her Lorazepam

## 2014-08-27 NOTE — Telephone Encounter (Signed)
X faxed and VM left making the patient aware.      KP

## 2014-08-30 ENCOUNTER — Other Ambulatory Visit: Payer: Self-pay | Admitting: Family Medicine

## 2014-09-11 ENCOUNTER — Telehealth: Payer: Self-pay | Admitting: Family Medicine

## 2014-09-11 DIAGNOSIS — G894 Chronic pain syndrome: Secondary | ICD-10-CM

## 2014-09-11 MED ORDER — HYDROCODONE-ACETAMINOPHEN 10-325 MG PO TABS: ORAL_TABLET | ORAL | Status: DC

## 2014-09-11 NOTE — Telephone Encounter (Signed)
Caller name: Shasta  Relation to pt: self  Call back number: [phone] Pharmacy: [facility] [phone]   Reason for call: pt requesting a refill HYDROcodone-acetaminophen (NORCO) 10-325 MG per tablet

## 2014-09-11 NOTE — Telephone Encounter (Signed)
Last seen 12/05/13 and filled 08/17/14 #60 UDS 05/26/13 low risk.   Please advise     KP

## 2014-09-11 NOTE — Telephone Encounter (Signed)
Patient aware Rx will be ready at 3 pm.     KP

## 2014-09-11 NOTE — Telephone Encounter (Signed)
OK to refill Hydrocodone same sig, same #.

## 2014-09-28 ENCOUNTER — Other Ambulatory Visit: Payer: Self-pay | Admitting: Family Medicine

## 2014-09-28 NOTE — Telephone Encounter (Signed)
Msg left on VM advising OV due now

## 2014-10-05 ENCOUNTER — Other Ambulatory Visit: Payer: Self-pay

## 2014-10-05 MED ORDER — AMLODIPINE BESYLATE 10 MG PO TABS: ORAL_TABLET | ORAL | Status: DC

## 2014-10-06 ENCOUNTER — Telehealth: Payer: Self-pay

## 2014-10-06 ENCOUNTER — Telehealth: Payer: Self-pay | Admitting: Family Medicine

## 2014-10-06 DIAGNOSIS — G894 Chronic pain syndrome: Secondary | ICD-10-CM

## 2014-10-06 MED ORDER — HYDROCODONE-ACETAMINOPHEN 10-325 MG PO TABS: ORAL_TABLET | ORAL | Status: DC

## 2014-10-06 NOTE — Telephone Encounter (Signed)
Pt way overdue for ov and labs---- refill x1 and make ov

## 2014-10-06 NOTE — Telephone Encounter (Signed)
noted 

## 2014-10-06 NOTE — Telephone Encounter (Signed)
There has got to be a place we can get her in before that

## 2014-10-06 NOTE — Telephone Encounter (Signed)
Last seen 12/05/13 and filled 09/11/14 #60  UDS 05/26/13. Please advise    KP

## 2014-10-06 NOTE — Telephone Encounter (Signed)
Patient presents to the office to pick up Rx and request from the front desk to have her BP checked due to her having a headache. Check BP for patient who states that systolic has been in the 276+ range.  Today was 164/77. Patient states that she has a headache. Advised to keep a check on BP and if continues to be elevated post pain medication to schedule to be seen. Due to patient availability she could not take and appt with PCP before Nov 17. She request with scheduler that she does not want to be scheduled with anyone else. Gave precautions for elevated BP.

## 2014-10-06 NOTE — Telephone Encounter (Signed)
Caller name: Claudine Relation to pt: self Call back number: [phone] Pharmacy:  Reason for call:   Patient is requesting a new hydrocodone rx. She states that she would like to pick this up today as her back is hurting her.

## 2014-10-06 NOTE — Telephone Encounter (Signed)
She has an apt scheduled already for 11/13.     KP

## 2014-10-06 NOTE — Telephone Encounter (Signed)
All your appointments are blocked and according to [facility] note she did was not available to come in any sooner.         KP

## 2014-10-20 ENCOUNTER — Ambulatory Visit: Payer: Medicare Other | Admitting: Family Medicine

## 2014-10-20 ENCOUNTER — Other Ambulatory Visit: Payer: Self-pay | Admitting: Family Medicine

## 2014-10-20 NOTE — Telephone Encounter (Signed)
last seen 12/05/13 and filled 08/27/14 #60 UDS 05/26/13 Low risk  Please advise.     KP

## 2014-10-26 ENCOUNTER — Telehealth: Payer: Self-pay | Admitting: Family Medicine

## 2014-10-26 NOTE — Telephone Encounter (Signed)
Margaret Dyer advised that her father's dementia is getting worst and mother really needs something. She is scheduled for tomorrow, she said her mother stress level is unnerved, Her oldest grandson is with them and he has depression and she is trying to cope with that, not to mention she takes care of the daughters other children when needed as well. Daughter states her mother has been in bed the last few days with a HA and her BP has been elevated, she just wants to be sure we are aware for tomorrow's visit.      KP

## 2014-10-26 NOTE — Telephone Encounter (Signed)
noted

## 2014-10-26 NOTE — Telephone Encounter (Signed)
Caller name: cheryl Relation to pt: daughter Call back number: [phone] Pharmacy:  Reason for call:   [name] states that patient has horrible anxiety. [name] states that she is worried about patient health. [name] wants to know if Dr. Etter Sjogren could give patient something to keep her nerves calm on a daily basis. [name] states that patient parents used to take buspar. Patient has appointment tomorrow with Dr. Etter Sjogren.

## 2014-10-27 ENCOUNTER — Encounter: Payer: Self-pay | Admitting: Family Medicine

## 2014-10-27 ENCOUNTER — Other Ambulatory Visit: Payer: Self-pay | Admitting: Family Medicine

## 2014-10-27 ENCOUNTER — Ambulatory Visit (INDEPENDENT_AMBULATORY_CARE_PROVIDER_SITE_OTHER): Payer: Medicare Other | Admitting: Family Medicine

## 2014-10-27 VITALS — BP 150/74 | HR 81 | Temp 98.2°F | Wt 211.8 lb

## 2014-10-27 DIAGNOSIS — G894 Chronic pain syndrome: Secondary | ICD-10-CM | POA: Diagnosis not present

## 2014-10-27 DIAGNOSIS — F411 Generalized anxiety disorder: Secondary | ICD-10-CM

## 2014-10-27 DIAGNOSIS — Z23 Encounter for immunization: Secondary | ICD-10-CM

## 2014-10-27 MED ORDER — TRAMADOL HCL 50 MG PO TABS: ORAL_TABLET | ORAL | Status: DC

## 2014-10-27 MED ORDER — HYDROCODONE-ACETAMINOPHEN 10-325 MG PO TABS: ORAL_TABLET | ORAL | Status: DC

## 2014-10-27 MED ORDER — SERTRALINE HCL 50 MG PO TABS: 50.0000 mg | ORAL_TABLET | Freq: Every day | ORAL | Status: DC

## 2014-10-27 MED ORDER — AMLODIPINE BESYLATE 10 MG PO TABS: ORAL_TABLET | ORAL | Status: DC

## 2014-10-27 NOTE — Progress Notes (Signed)
Subjective:    Patient ID: Margaret Dyer, female    DOB: 03/20/1935, 79 y.o.   MRN: [id number]  HPI Pt is here to f/u depression, anxiety and htn.  Dm per endo.   Pt is under a lot of stress secondary to taking care of her grandson  With bipolar and husband with dementia.  Her bp as a result has been high.  She has had ha but when bp is down she feels better. --- when stress is less.  Past Medical History  Diagnosis Date  . Asthma   . Diabetes mellitus   . Osteopenia   . Renal cell carcinoma   . Goiter   . Hypertension   . Hyperlipidemia   . Blood transfusion    History   Social History  . Marital Status: Married    Spouse Name: N/A    Number of Children: N/A  . Years of Education: N/A   Occupational History  . Not on file.   Social History Main Topics  . Smoking status: Former Research scientist (life sciences)  . Smokeless tobacco: Never Used     Comment: never used tobacco  . Alcohol Use: No  . Drug Use: No  . Sexual Activity: Not on file   Other Topics Concern  . Not on file   Social History Narrative   Family History  Problem Relation Age of Onset  . Cancer Sister     bladder  . Kidney cancer Father   . Cancer Father     renal  . Coronary artery disease    . Diabetes    . Hyperlipidemia    . Hypertension     Current Outpatient Prescriptions  Medication Sig Dispense Refill  . amLODipine (NORVASC) 10 MG tablet take 1 tablet by mouth once daily 30 tablet 5  . ezetimibe-simvastatin (VYTORIN) 10-20 MG per tablet Take 1 tablet by mouth at bedtime.    Marland Kitchen HYDROcodone-acetaminophen (NORCO) 10-325 MG per tablet take 1 tablet by mouth every 8 hours if needed for pain 60 tablet 0  . insulin glargine (LANTUS) 100 UNIT/ML injection Inject 55 Units into the skin every evening.     . insulin glulisine (APIDRA) 100 UNIT/ML injection Inject 10-40 Units into the skin 2 (two) times daily. 35 Units sq in am and 20-30 sq before dinner    . levothyroxine (SYNTHROID, LEVOTHROID) 25 MCG tablet Take 25  mcg by mouth daily.     Marland Kitchen LORazepam (ATIVAN) 0.5 MG tablet take 1 tablet by mouth twice a day if needed for anxiety 60 tablet 1  . losartan (COZAAR) 100 MG tablet take 1 tablet by mouth once daily 30 tablet 0  . metoprolol tartrate (LOPRESSOR) 25 MG tablet take 1 tablet by mouth twice a day 180 tablet 1  . ondansetron (ZOFRAN) 4 MG tablet take 1 tablet by mouth every 8 hours if needed for nausea 15 tablet 1  . ondansetron (ZOFRAN-ODT) 8 MG disintegrating tablet Take 1 tablet (8 mg total) by mouth every 12 (twelve) hours as needed for nausea. 20 tablet 0  . pantoprazole (PROTONIX) 40 MG tablet Take 40 mg by mouth daily.     . traMADol (ULTRAM) 50 MG tablet take 2 tablets by mouth twice a day if needed 90 tablet 0  . sertraline (ZOLOFT) 50 MG tablet Take 1 tablet (50 mg total) by mouth daily. 30 tablet 3   No current facility-administered medications for this visit.   .  Review of Systems As above  Objective:   Physical Exam  BP 150/74 mmHg  Pulse 81  Temp(Src) 98.2 F (36.8 C) (Oral)  Wt 211 lb 12.8 oz (96.072 kg)  SpO2 96% General appearance: alert, cooperative, appears stated age and no distress Neck: no adenopathy, supple, symmetrical, trachea midline and thyroid not enlarged, symmetric, no tenderness/mass/nodules Lungs: clear to auscultation bilaterally Heart: S1, S2 normal Extremities: extremities normal, atraumatic, no cyanosis or edema      Assessment & Plan:  1. Need for prophylactic vaccination and inoculation against influenza   - Flu Vaccine QUAD 36+ mos PF IM (Fluarix Quad PF)  2. Chronic pain syndrome Refill meds - HYDROcodone-acetaminophen (NORCO) 10-325 MG per tablet; take 1 tablet by mouth every 8 hours if needed for pain  Dispense: 60 tablet; Refill: 0 - traMADol (ULTRAM) 50 MG tablet; take 2 tablets by mouth twice a day if needed  Dispense: 90 tablet; Refill: 0  3. Generalized anxiety disorder Start zoloft - sertraline (ZOLOFT) 50 MG tablet; Take 1  tablet (50 mg total) by mouth daily.  Dispense: 30 tablet; Refill: 3 rto 4-6 weeks

## 2014-10-27 NOTE — Patient Instructions (Signed)
1.  Start zoloft 50 mg 1/2 tab every day for 8 days then increase to 1 a day

## 2014-10-27 NOTE — Progress Notes (Signed)
Pre visit review using our clinic review tool, if applicable. No additional management support is needed unless otherwise documented below in the visit note.

## 2014-11-09 ENCOUNTER — Telehealth: Payer: Self-pay | Admitting: Family Medicine

## 2014-11-09 MED ORDER — LOSARTAN POTASSIUM 100 MG PO TABS
100.0000 mg | ORAL_TABLET | Freq: Every day | ORAL | Status: DC
Start: 2014-11-09 — End: 2015-05-10

## 2014-11-09 NOTE — Telephone Encounter (Signed)
Caller name: Margaret Dyer Relation to pt: self Call back number: [phone] Pharmacy: [facility] on [address]  Reason for call:   Patient is out of losartan, please send in refill

## 2014-11-09 NOTE — Telephone Encounter (Signed)
Rx faxed     KP

## 2014-11-12 ENCOUNTER — Telehealth: Payer: Self-pay | Admitting: *Deleted

## 2014-11-12 NOTE — Telephone Encounter (Signed)
Received application for patient assistance for Vytorin. Form filled out and forwarded to Dr. Etter Sjogren. JG//CMA

## 2014-11-12 NOTE — Telephone Encounter (Signed)
Completed form mailed to [employer] Patient Assistance Program at [place] [id number]. Copy mailed to patient at her home address. Copy sent for scanning. JG//CMA

## 2014-11-23 ENCOUNTER — Telehealth: Payer: Self-pay | Admitting: Family Medicine

## 2014-11-23 DIAGNOSIS — G894 Chronic pain syndrome: Secondary | ICD-10-CM

## 2014-11-23 MED ORDER — HYDROCODONE-ACETAMINOPHEN 10-325 MG PO TABS: ORAL_TABLET | ORAL | Status: DC

## 2014-11-23 NOTE — Telephone Encounter (Signed)
Last seen and filled 10/27/14 #60. Please advise      KP

## 2014-11-23 NOTE — Telephone Encounter (Signed)
Patient aware Rx ready for pick up.     KP

## 2014-11-23 NOTE — Telephone Encounter (Signed)
Refill x1

## 2014-11-23 NOTE — Telephone Encounter (Signed)
Caller name:Mitnick Deeanna Relation to HH:AMNS Call back [place]:  Reason for call: pt need rx HYDROcodone-acetaminophen ([place]) 10-325 MG per , pt states if at all possible she would like to pick the rx up today, states she calling early because of the holidays.

## 2014-11-24 ENCOUNTER — Ambulatory Visit: Payer: Medicare Other | Admitting: Family Medicine

## 2014-12-01 ENCOUNTER — Telehealth: Payer: Self-pay | Admitting: Family Medicine

## 2014-12-01 DIAGNOSIS — G894 Chronic pain syndrome: Secondary | ICD-10-CM

## 2014-12-01 MED ORDER — TRAMADOL HCL 50 MG PO TABS: ORAL_TABLET | ORAL | Status: DC

## 2014-12-01 NOTE — Telephone Encounter (Signed)
Caller name:Keleher Lil Relation to CL:EXNT Call back [place]:  Reason for call: pt is needing rx for traMADol (ULTRAM) 50 MG tablet please call when available and ready for pick

## 2014-12-01 NOTE — Telephone Encounter (Signed)
Last seen and filled 10/27/14 #90 UDS 05/27/13 Low risk   Please advise     KP

## 2014-12-01 NOTE — Telephone Encounter (Signed)
Rx faxed.    KP

## 2014-12-01 NOTE — Telephone Encounter (Signed)
Refill x1

## 2014-12-08 ENCOUNTER — Ambulatory Visit: Payer: Medicare Other | Admitting: Family Medicine

## 2014-12-21 ENCOUNTER — Telehealth: Payer: Self-pay | Admitting: Family Medicine

## 2014-12-21 DIAGNOSIS — G894 Chronic pain syndrome: Secondary | ICD-10-CM

## 2014-12-21 MED ORDER — HYDROCODONE-ACETAMINOPHEN 10-325 MG PO TABS: ORAL_TABLET | ORAL | Status: DC

## 2014-12-21 NOTE — Telephone Encounter (Signed)
Last seen 10/27/14 and filled 11/23/14 #60 UDS 05/26/13 low risk   Please advise     KP

## 2014-12-21 NOTE — Telephone Encounter (Signed)
Refill x1

## 2014-12-21 NOTE — Telephone Encounter (Signed)
Caller name: Tsion Relation to pt: self Call back number: [phone] Pharmacy:  Reason for call:   Requesting a new hydrocodone rx

## 2014-12-21 NOTE — Addendum Note (Signed)
Addended by: Ewing Schlein on: 12/21/2014 02:05 PM   Modules accepted: Orders

## 2014-12-21 NOTE — Telephone Encounter (Signed)
Patient aware Rx will be ready for pick up tomorrow.      KP

## 2014-12-24 ENCOUNTER — Other Ambulatory Visit: Payer: Self-pay | Admitting: Family Medicine

## 2014-12-24 NOTE — Telephone Encounter (Signed)
Metoprolol refill sent. Pt is past due for follow up with Dr Etter Sjogren.  Please call pt to arrange appt soon.

## 2014-12-28 ENCOUNTER — Telehealth: Payer: Self-pay

## 2014-12-28 MED ORDER — LORAZEPAM 0.5 MG PO TABS: ORAL_TABLET | ORAL | Status: DC

## 2014-12-28 NOTE — Telephone Encounter (Signed)
Refill x1   With 1 refill

## 2014-12-28 NOTE — Telephone Encounter (Signed)
Informed patient of medication refill and she scheduled appointment for mid February

## 2014-12-28 NOTE — Telephone Encounter (Signed)
Last seen 10/27/14 and filled 10/20/14 #60 with 1 refill UDS low risk--Negative for everything   Please advise     KP

## 2014-12-28 NOTE — Telephone Encounter (Signed)
Faxed     KP

## 2015-01-11 ENCOUNTER — Telehealth: Payer: Self-pay | Admitting: Family Medicine

## 2015-01-11 DIAGNOSIS — G894 Chronic pain syndrome: Secondary | ICD-10-CM

## 2015-01-11 MED ORDER — TRAMADOL HCL 50 MG PO TABS: ORAL_TABLET | ORAL | Status: DC

## 2015-01-11 NOTE — Telephone Encounter (Signed)
Yes--- refill x1 and do uds

## 2015-01-11 NOTE — Telephone Encounter (Signed)
Caller name: Margaret Dyer Relation to pt: self Call back number: [phone] or [phone] [facility]acy[facility] on [address]  Reason for call:   Requesting tramadol refill

## 2015-01-11 NOTE — Telephone Encounter (Signed)
The last UDS was last year, would you like to do another?

## 2015-01-11 NOTE — Telephone Encounter (Signed)
patient aware and will pick up tomorrow.     KP

## 2015-01-11 NOTE — Telephone Encounter (Signed)
uds should have been positive---- ask pt about this--- if she is picking up rx monthly on time it should not have been neg.  We may not be able to write it anymore.

## 2015-01-11 NOTE — Telephone Encounter (Signed)
Last seen 10/27/14 and filled 12/01/14 #90 UDS 12/10/13 low risk but Neg for Hydrocodone    Please advise     KP

## 2015-01-12 DIAGNOSIS — Z79899 Other long term (current) drug therapy: Secondary | ICD-10-CM | POA: Diagnosis not present

## 2015-01-12 DIAGNOSIS — Z79891 Long term (current) use of opiate analgesic: Secondary | ICD-10-CM | POA: Diagnosis not present

## 2015-01-19 ENCOUNTER — Encounter: Payer: Self-pay | Admitting: Family Medicine

## 2015-01-19 ENCOUNTER — Other Ambulatory Visit (HOSPITAL_COMMUNITY)
Admission: RE | Admit: 2015-01-19 | Discharge: 2015-01-19 | Disposition: A | Discharge status: Home / Self Care | Payer: Medicare Other | Source: Ambulatory Visit | Attending: Family Medicine | Admitting: Family Medicine

## 2015-01-19 ENCOUNTER — Ambulatory Visit (INDEPENDENT_AMBULATORY_CARE_PROVIDER_SITE_OTHER): Payer: Medicare Other | Admitting: Family Medicine

## 2015-01-19 VITALS — BP 142/66 | HR 76 | Temp 98.0°F | Wt 198.8 lb

## 2015-01-19 DIAGNOSIS — R110 Nausea: Secondary | ICD-10-CM

## 2015-01-19 DIAGNOSIS — F411 Generalized anxiety disorder: Secondary | ICD-10-CM

## 2015-01-19 DIAGNOSIS — N760 Acute vaginitis: Secondary | ICD-10-CM

## 2015-01-19 DIAGNOSIS — I10 Essential (primary) hypertension: Secondary | ICD-10-CM | POA: Diagnosis not present

## 2015-01-19 DIAGNOSIS — E785 Hyperlipidemia, unspecified: Secondary | ICD-10-CM | POA: Diagnosis not present

## 2015-01-19 DIAGNOSIS — G894 Chronic pain syndrome: Secondary | ICD-10-CM | POA: Diagnosis not present

## 2015-01-19 MED ORDER — FLUCONAZOLE 150 MG PO TABS: 150.0000 mg | ORAL_TABLET | Freq: Once | ORAL | Status: DC

## 2015-01-19 MED ORDER — SERTRALINE HCL 50 MG PO TABS: 50.0000 mg | ORAL_TABLET | Freq: Every day | ORAL | Status: DC

## 2015-01-19 MED ORDER — HYDROCODONE-ACETAMINOPHEN 10-325 MG PO TABS: ORAL_TABLET | ORAL | Status: DC

## 2015-01-19 MED ORDER — AMLODIPINE BESYLATE 10 MG PO TABS: ORAL_TABLET | ORAL | Status: DC

## 2015-01-19 MED ORDER — ONDANSETRON HCL 4 MG PO TABS
4.0000 mg | ORAL_TABLET | Freq: Three times a day (TID) | ORAL | Status: DC | PRN
Start: 2015-01-19 — End: 2015-08-03

## 2015-01-19 NOTE — Patient Instructions (Signed)

## 2015-01-19 NOTE — Progress Notes (Signed)
Pre visit review using our clinic review tool, if applicable. No additional management support is needed unless otherwise documented below in the visit note.

## 2015-01-19 NOTE — Assessment & Plan Note (Signed)
Check labs con't vytorin

## 2015-01-19 NOTE — Progress Notes (Signed)
Subjective:    Patient ID: Margaret Dyer, female    DOB: 1935/07/11, 80 y.o.   MRN: [id number]  HPI  Patient here for f/u htn.  She is also c/o vaginal yeast but is refusing an exam.  Past Medical History  Diagnosis Date  . Asthma   . Diabetes mellitus   . Osteopenia   . Renal cell carcinoma   . Goiter   . Hypertension   . Hyperlipidemia   . Blood transfusion    Current Outpatient Prescriptions  Medication Sig Dispense Refill  . amLODipine (NORVASC) 10 MG tablet take 1 tablet by mouth once daily 30 tablet 5  . ezetimibe-simvastatin (VYTORIN) 10-20 MG per tablet Take 1 tablet by mouth at bedtime.    Marland Kitchen HYDROcodone-acetaminophen (NORCO) 10-325 MG per tablet take 1 tablet by mouth every 8 hours if needed for pain 60 tablet 0  . insulin glargine (LANTUS) 100 UNIT/ML injection Inject 55 Units into the skin every evening.     . insulin glulisine (APIDRA) 100 UNIT/ML injection Inject 10-40 Units into the skin 2 (two) times daily. 35 Units sq in am and 20-30 sq before dinner    . levothyroxine (SYNTHROID, LEVOTHROID) 25 MCG tablet Take 25 mcg by mouth daily.     Marland Kitchen LORazepam (ATIVAN) 0.5 MG tablet take 1 tablet by mouth twice a day if needed for anxiety 60 tablet 1  . losartan (COZAAR) 100 MG tablet Take 1 tablet (100 mg total) by mouth daily. 30 tablet 5  . metoprolol tartrate (LOPRESSOR) 25 MG tablet take 1 tablet by mouth twice a day 180 tablet 0  . ondansetron (ZOFRAN) 4 MG tablet Take 1 tablet (4 mg total) by mouth every 8 (eight) hours as needed for nausea or vomiting. 15 tablet 1  . ondansetron (ZOFRAN-ODT) 8 MG disintegrating tablet Take 1 tablet (8 mg total) by mouth every 12 (twelve) hours as needed for nausea. 20 tablet 0  . pantoprazole (PROTONIX) 40 MG tablet Take 40 mg by mouth daily.     . sertraline (ZOLOFT) 50 MG tablet Take 1 tablet (50 mg total) by mouth daily. 30 tablet 3  . traMADol (ULTRAM) 50 MG tablet take 2 tablets by mouth twice a day if needed 90 tablet 0  .  fluconazole (DIFLUCAN) 150 MG tablet Take 1 tablet (150 mg total) by mouth once. May repeat in 3 days prn 2 tablet 2   No current facility-administered medications for this visit.    Review of Systems  Constitutional: Negative for activity change, appetite change, fatigue and unexpected weight change.  Respiratory: Negative for cough and shortness of breath.   Cardiovascular: Negative for chest pain, palpitations and leg swelling.  Genitourinary: Positive for dysuria.       Vaginal itching No d/c  Psychiatric/Behavioral: Negative for behavioral problems and dysphoric mood. The patient is not nervous/anxious.        Objective:    Physical Exam  Constitutional: She is oriented to person, place, and time. She appears well-developed and well-nourished. No distress.  HENT:  Right Ear: External ear normal.  Left Ear: External ear normal.  Nose: Nose normal.  Mouth/Throat: Oropharynx is clear and moist.  Eyes: EOM are normal. Pupils are equal, round, and reactive to light.  Neck: Normal range of motion. Neck supple.  Cardiovascular: Normal rate, regular rhythm and normal heart sounds.   No murmur heard. Pulmonary/Chest: Effort normal and breath sounds normal. No respiratory distress. She has no wheezes. She has no rales.  She exhibits no tenderness.  Neurological: She is alert and oriented to person, place, and time.  Psychiatric: She has a normal mood and affect. Her behavior is normal. Judgment and thought content normal.    BP 142/66 mmHg  Pulse 76  Temp(Src) 98 F (36.7 C) (Oral)  Wt 198 lb 12.8 oz (90.175 kg)  SpO2 96%  LMP  (LMP Unknown) Wt Readings from Last 3 Encounters:  01/19/15 198 lb 12.8 oz (90.175 kg)  10/27/14 211 lb 12.8 oz (96.072 kg)  05/08/14 204 lb (92.534 kg)     Lab Results  Component Value Date   WBC 5.8 05/08/2014   HGB 15.3 05/08/2014   HCT 43.9 05/08/2014   PLT 103 Platelet count consistent in citrate* 05/08/2014   GLUCOSE 398* 03/02/2014    CHOL 118 05/31/2012   TRIG 139.0 05/31/2012   HDL 41.20 05/31/2012   LDLCALC 49 05/31/2012   ALT 31 03/02/2014   AST 28 03/02/2014   NA 137 03/02/2014   K 4.7 03/02/2014   CL 99 03/02/2014   CREATININE 0.70 03/02/2014   BUN 23 03/02/2014   CO2 24 03/02/2014   TSH 0.559 05/08/2014   HGBA1C 9.5* 11/01/2011   MICROALBUR 74.9* 11/01/2011    Dg Chest 2 View  03/02/2014   CLINICAL DATA:  Fall with back pain.  EXAM: CHEST - 2 VIEW  COMPARISON:  DG RIBS UNILATERAL W/CHEST*R* dated 05/23/2013; DG CHEST 2 VIEW dated 11/04/2012  FINDINGS: The heart size and mediastinal contours are within normal limits. There is no evidence of pulmonary edema, consolidation, pneumothorax, nodule or pleural fluid. The bony thorax shows osteopenia with no evidence of visible fracture of the thoracic spine.  IMPRESSION: No active disease.   Electronically Signed   By: Aletta Edouard M.D.   On: 03/02/2014 17:10       Assessment & Plan:   Problem List Items Addressed This Visit    Hyperlipidemia    Check labs con't vytorin      Relevant Medications   amLODIpine (NORVASC) tablet   Other Relevant Orders   Lipid panel   Basic metabolic panel   Essential hypertension    con't norvasc, metoprolol And losartan Stable       Relevant Medications   amLODIpine (NORVASC) tablet   Other Relevant Orders   Hepatic function panel    Other Visit Diagnoses    Chronic pain syndrome    -  Primary    Relevant Medications    HYDROcodone-acetaminophen (NORCO) 10-325 MG per tablet    Generalized anxiety disorder        Relevant Medications    sertraline (ZOLOFT) tablet    Nausea without vomiting        Relevant Medications    ondansetron (ZOFRAN) tablet    Vaginitis and vulvovaginitis        Relevant Orders    POCT urinalysis dipstick    Urine cytology ancillary only        Garnet Koyanagi, DO

## 2015-01-19 NOTE — Assessment & Plan Note (Signed)
con't norvasc, metoprolol And losartan Stable

## 2015-01-20 LAB — POCT URINALYSIS DIPSTICK
BILIRUBIN UA: NEGATIVE
KETONES UA: NEGATIVE
Leukocytes, UA: NEGATIVE
Nitrite, UA: NEGATIVE
RBC UA: NEGATIVE
SPEC GRAV UA: 1.02
Urobilinogen, UA: 0.2
pH, UA: 6

## 2015-01-20 LAB — HEPATIC FUNCTION PANEL
ALT: 35 U/L (ref 0–35)
AST: 26 U/L (ref 0–37)
Albumin: 3.8 g/dL (ref 3.5–5.2)
Alkaline Phosphatase: 88 U/L (ref 39–117)
BILIRUBIN DIRECT: 0.1 mg/dL (ref 0.0–0.3)
BILIRUBIN TOTAL: 0.5 mg/dL (ref 0.2–1.2)
TOTAL PROTEIN: 6.8 g/dL (ref 6.0–8.3)

## 2015-01-20 LAB — BASIC METABOLIC PANEL
BUN: 19 mg/dL (ref 6–23)
CALCIUM: 9.6 mg/dL (ref 8.4–10.5)
CO2: 28 meq/L (ref 19–32)
Chloride: 98 mEq/L (ref 96–112)
Creatinine, Ser: 0.99 mg/dL (ref 0.40–1.20)
GFR: 57.71 mL/min — ABNORMAL LOW (ref >60.00)
GLUCOSE: 444 mg/dL — AB (ref 70–99)
Potassium: 4.8 mEq/L (ref 3.5–5.1)
SODIUM: 134 meq/L — AB (ref 135–145)

## 2015-01-20 LAB — LIPID PANEL
Cholesterol: 155 mg/dL (ref 0–200)
HDL: 41.9 mg/dL (ref >39.00)
NONHDL: 113.1
TRIGLYCERIDES: 202 mg/dL — AB (ref 0.0–149.0)
Total CHOL/HDL Ratio: 4
VLDL: 40.4 mg/dL — ABNORMAL HIGH (ref 0.0–40.0)

## 2015-01-20 LAB — LDL CHOLESTEROL, DIRECT: Direct LDL: 89 mg/dL

## 2015-01-21 LAB — URINE CYTOLOGY ANCILLARY ONLY
BACTERIAL VAGINITIS: NEGATIVE
CANDIDA VAGINITIS: POSITIVE — AB

## 2015-01-27 ENCOUNTER — Encounter: Payer: Self-pay | Admitting: Family Medicine

## 2015-01-29 ENCOUNTER — Telehealth: Payer: Self-pay | Admitting: Family Medicine

## 2015-01-29 DIAGNOSIS — D649 Anemia, unspecified: Secondary | ICD-10-CM

## 2015-01-29 NOTE — Telephone Encounter (Signed)
Caller name: Eller Relation to pt: self  Call back number: [phone] Pharmacy:  Reason for call:   Patient wants to know if she was tested for anemia at last visit. She states that she is always tired and is eating a lot of ice.

## 2015-01-29 NOTE — Telephone Encounter (Signed)
CBC-D was not done, please advise      KP

## 2015-01-29 NOTE — Telephone Encounter (Signed)
She was not--- cbcd, ibc , ferritiin---  anemia

## 2015-02-01 NOTE — Telephone Encounter (Signed)
MSG left to call the office      KP

## 2015-02-02 NOTE — Telephone Encounter (Signed)
Patent has been made aware and she said she would cal back to schedule the lab apt. The orders are in.     [place]

## 2015-02-04 ENCOUNTER — Telehealth: Payer: Self-pay | Admitting: Family Medicine

## 2015-02-04 NOTE — Telephone Encounter (Signed)
Caller name: Heidie Relation to pt: self Call back number: [phone] or [phone] Pharmacy:  [facility] on [address]  Reason for call:   Patient would like another tramadol refill. She states that she is still having shoulder pain.

## 2015-02-04 NOTE — Telephone Encounter (Signed)
Last seen 01/19/25 and filled 01/11/15 #90 UDS 01/12/15 Neg for everything and we fill every 30 days   Please advise     KP

## 2015-02-04 NOTE — Telephone Encounter (Signed)
We will be unable to fill ultram ----uds neg and she has been getting it every month

## 2015-02-04 NOTE — Telephone Encounter (Signed)
Msg left to call the office     KP

## 2015-02-05 NOTE — Telephone Encounter (Signed)
Patient is aware and she verbalized understanding, she said she does not understand why UDS is Neg but she will call back next week.       KP

## 2015-02-05 NOTE — Telephone Encounter (Signed)
Patient returned phone call. Best # [phone]

## 2015-02-16 ENCOUNTER — Telehealth: Payer: Self-pay | Admitting: Family Medicine

## 2015-02-16 NOTE — Telephone Encounter (Signed)
If she is willing to wait that is all we have, otherwise we can offer her an apt with one of the other female providers.      KP

## 2015-02-16 NOTE — Telephone Encounter (Addendum)
Caller name: Letizia, Hook  Relation to pt: self  Call back [place]   Reason for call:  Pt is expercicing frequent urination, vaginal itching and burning pt wanted to only see MD and MD next available appt. is not until Friday 02/19/15 (pt scheduled appointment for 02/19/15) please advise

## 2015-02-17 NOTE — Telephone Encounter (Signed)
Just an FYI- pt was adamant about seeing Dr. Etter Sjogren only.

## 2015-02-19 ENCOUNTER — Encounter: Payer: Self-pay | Admitting: Family Medicine

## 2015-02-19 ENCOUNTER — Ambulatory Visit (INDEPENDENT_AMBULATORY_CARE_PROVIDER_SITE_OTHER): Payer: Medicare Other | Admitting: Family Medicine

## 2015-02-19 VITALS — BP 158/66 | HR 83 | Temp 98.1°F | Wt 199.0 lb

## 2015-02-19 DIAGNOSIS — G894 Chronic pain syndrome: Secondary | ICD-10-CM

## 2015-02-19 DIAGNOSIS — I10 Essential (primary) hypertension: Secondary | ICD-10-CM

## 2015-02-19 DIAGNOSIS — R350 Frequency of micturition: Secondary | ICD-10-CM | POA: Diagnosis not present

## 2015-02-19 DIAGNOSIS — E1165 Type 2 diabetes mellitus with hyperglycemia: Secondary | ICD-10-CM

## 2015-02-19 DIAGNOSIS — N760 Acute vaginitis: Secondary | ICD-10-CM

## 2015-02-19 DIAGNOSIS — R81 Glycosuria: Secondary | ICD-10-CM | POA: Diagnosis not present

## 2015-02-19 DIAGNOSIS — E119 Type 2 diabetes mellitus without complications: Secondary | ICD-10-CM | POA: Diagnosis not present

## 2015-02-19 DIAGNOSIS — IMO0002 Reserved for concepts with insufficient information to code with codable children: Secondary | ICD-10-CM

## 2015-02-19 LAB — GLUCOSE, POCT (MANUAL RESULT ENTRY): POC Glucose: 290 mg/dl — AB (ref 70–99)

## 2015-02-19 LAB — CBC WITH DIFFERENTIAL/PLATELET
BASOS ABS: 0.1 10*3/uL (ref 0.0–0.1)
Basophils Relative: 1 % (ref 0–1)
Eosinophils Absolute: 0.2 10*3/uL (ref 0.0–0.7)
Eosinophils Relative: 3 % (ref 0–5)
HCT: 46.6 % — ABNORMAL HIGH (ref 36.0–46.0)
Hemoglobin: 15.9 g/dL — ABNORMAL HIGH (ref 12.0–15.0)
LYMPHS ABS: 1.6 10*3/uL (ref 0.7–4.0)
LYMPHS PCT: 26 % (ref 12–46)
MCH: 30.6 pg (ref 26.0–34.0)
MCHC: 34.1 g/dL (ref 30.0–36.0)
MCV: 89.6 fL (ref 78.0–100.0)
MPV: 12.8 fL — AB (ref 8.6–12.4)
Monocytes Absolute: 0.6 10*3/uL (ref 0.1–1.0)
Monocytes Relative: 9 % (ref 3–12)
Neutro Abs: 3.8 10*3/uL (ref 1.7–7.7)
Neutrophils Relative %: 61 % (ref 43–77)
PLATELETS: 132 10*3/uL — AB (ref 150–400)
RBC: 5.2 MIL/uL — AB (ref 3.87–5.11)
RDW: 14 % (ref 11.5–15.5)
WBC: 6.3 10*3/uL (ref 4.0–10.5)

## 2015-02-19 LAB — POCT URINALYSIS DIPSTICK
Bilirubin, UA: NEGATIVE
Ketones, UA: NEGATIVE
Leukocytes, UA: NEGATIVE
NITRITE UA: NEGATIVE
PH UA: 6
RBC UA: NEGATIVE
SPEC GRAV UA: 1.025
UROBILINOGEN UA: 2

## 2015-02-19 MED ORDER — INSULIN GLULISINE 100 UNIT/ML IJ SOLN: INTRAMUSCULAR | Status: DC

## 2015-02-19 MED ORDER — LORAZEPAM 0.5 MG PO TABS: ORAL_TABLET | ORAL | Status: DC

## 2015-02-19 MED ORDER — METOPROLOL TARTRATE 25 MG PO TABS: 25.0000 mg | ORAL_TABLET | Freq: Two times a day (BID) | ORAL | Status: DC

## 2015-02-19 MED ORDER — TRAMADOL HCL 50 MG PO TABS: ORAL_TABLET | ORAL | Status: DC

## 2015-02-19 MED ORDER — HYDROCODONE-ACETAMINOPHEN 10-325 MG PO TABS: ORAL_TABLET | ORAL | Status: DC

## 2015-02-19 NOTE — Progress Notes (Signed)
Patient ID: Margaret Dyer, female    DOB: 31-May-1935  Age: 80 y.o. MRN: [id number]    Subjective:  Subjective HPI  ETHELYN CERNIGLIA presents for c/o persistant vulvar yeast infection.  No d/c.  She is also c/o dysuria She needs meds refilled and f/u dm, cholesterol and htn.   Review of Systems  Constitutional: Negative for activity change, appetite change, fatigue and unexpected weight change.  Respiratory: Negative for cough and shortness of breath.   Cardiovascular: Negative for chest pain and palpitations.  Genitourinary: Positive for dysuria and frequency. Negative for urgency, flank pain, difficulty urinating and dyspareunia.  Psychiatric/Behavioral: Negative for behavioral problems and dysphoric mood. The patient is not nervous/anxious.     History Past Medical History  Diagnosis Date  . Asthma   . Diabetes mellitus   . Osteopenia   . Renal cell carcinoma   . Goiter   . Hypertension   . Hyperlipidemia   . Blood transfusion     She has past surgical history that includes Nephrectomy (09.2000); Appendectomy; Abdominal hysterectomy; Knee surgery; and Hemorrhoid surgery.   Her family history includes Cancer in her father and sister; Coronary artery disease in an other family member; Diabetes in an other family member; Hyperlipidemia in an other family member; Hypertension in an other family member; Kidney cancer in her father.She reports that she has quit smoking. She has never used smokeless tobacco. She reports that she does not drink alcohol or use illicit drugs.  Current Outpatient Prescriptions on File Prior to Visit  Medication Sig Dispense Refill  . amLODipine (NORVASC) 10 MG tablet take 1 tablet by mouth once daily 30 tablet 5  . ezetimibe-simvastatin (VYTORIN) 10-20 MG per tablet Take 1 tablet by mouth at bedtime.    . fluconazole (DIFLUCAN) 150 MG tablet Take 1 tablet (150 mg total) by mouth once. May repeat in 3 days prn 2 tablet 2  . insulin glargine (LANTUS) 100  UNIT/ML injection Inject 55 Units into the skin every evening.     Marland Kitchen levothyroxine (SYNTHROID, LEVOTHROID) 25 MCG tablet Take 25 mcg by mouth daily.     Marland Kitchen losartan (COZAAR) 100 MG tablet Take 1 tablet (100 mg total) by mouth daily. 30 tablet 5  . ondansetron (ZOFRAN) 4 MG tablet Take 1 tablet (4 mg total) by mouth every 8 (eight) hours as needed for nausea or vomiting. 15 tablet 1  . ondansetron (ZOFRAN-ODT) 8 MG disintegrating tablet Take 1 tablet (8 mg total) by mouth every 12 (twelve) hours as needed for nausea. 20 tablet 0  . pantoprazole (PROTONIX) 40 MG tablet Take 40 mg by mouth daily.     . sertraline (ZOLOFT) 50 MG tablet Take 1 tablet (50 mg total) by mouth daily. 30 tablet 3   No current facility-administered medications on file prior to visit.     Objective:  Objective Physical Exam  Constitutional: She is oriented to person, place, and time. She appears well-developed and well-nourished. No distress.  HENT:  Right Ear: External ear normal.  Left Ear: External ear normal.  Nose: Nose normal.  Mouth/Throat: Oropharynx is clear and moist.  Eyes: EOM are normal. Pupils are equal, round, and reactive to light.  Neck: Normal range of motion. Neck supple.  Cardiovascular: Normal rate, regular rhythm and normal heart sounds.   No murmur heard. Pulmonary/Chest: Effort normal and breath sounds normal. No respiratory distress. She has no wheezes. She has no rales. She exhibits no tenderness.  Genitourinary: There is rash and tenderness  on the right labia. There is rash and tenderness on the left labia. There is erythema in the vagina. No vaginal discharge found.  Neurological: She is alert and oriented to person, place, and time.  Psychiatric: She has a normal mood and affect. Her behavior is normal. Judgment and thought content normal.  Sensory exam of the foot is normal, tested with the monofilament. Good pulses, no lesions or ulcers, good peripheral pulses.   BP 158/66 mmHg  Pulse  83  Temp(Src) 98.1 F (36.7 C) (Oral)  Wt 199 lb (90.266 kg)  SpO2 98%  LMP  (LMP Unknown) Wt Readings from Last 3 Encounters:  02/19/15 199 lb (90.266 kg)  01/19/15 198 lb 12.8 oz (90.175 kg)  10/27/14 211 lb 12.8 oz (96.072 kg)     Lab Results  Component Value Date   WBC 6.3 02/19/2015   HGB 15.9* 02/19/2015   HCT 46.6* 02/19/2015   PLT 132* 02/19/2015   GLUCOSE 444* 01/19/2015   CHOL 155 01/19/2015   TRIG 202.0* 01/19/2015   HDL 41.90 01/19/2015   LDLDIRECT 89.0 01/19/2015   LDLCALC 49 05/31/2012   ALT 35 01/19/2015   AST 26 01/19/2015   NA 134* 01/19/2015   K 4.8 01/19/2015   CL 98 01/19/2015   CREATININE 0.99 01/19/2015   BUN 19 01/19/2015   CO2 28 01/19/2015   TSH 0.559 05/08/2014   HGBA1C 9.5* 11/01/2011   MICROALBUR 74.9* 11/01/2011    No results found.   Assessment & Plan:  Plan I have discontinued Ms. Fredericks's insulin glulisine. I have also changed her metoprolol tartrate. Additionally, I am having her start on insulin glulisine. Lastly, I am having her maintain her insulin glargine, pantoprazole, levothyroxine, ezetimibe-simvastatin, ondansetron, losartan, ondansetron, amLODipine, fluconazole, sertraline, HYDROcodone-acetaminophen, LORazepam, and traMADol.  Meds ordered this encounter  Medications  . HYDROcodone-acetaminophen (NORCO) 10-325 MG per tablet    Sig: take 1 tablet by mouth every 8 hours if needed for pain    Dispense:  60 tablet    Refill:  0  . metoprolol tartrate (LOPRESSOR) 25 MG tablet    Sig: Take 1 tablet (25 mg total) by mouth 2 (two) times daily.    Dispense:  180 tablet    Refill:  0  . insulin glulisine (APIDRA) 100 UNIT/ML injection    Sig: 40 u sq in am and 80 u sq q pm    Dispense:  10 mL    Refill:  11  . LORazepam (ATIVAN) 0.5 MG tablet    Sig: take 1 tablet by mouth twice a day if needed for anxiety    Dispense:  60 tablet    Refill:  1  . traMADol (ULTRAM) 50 MG tablet    Sig: take 2 tablets by mouth twice a day if  needed    Dispense:  90 tablet    Refill:  0    Problem List Items Addressed This Visit    Essential hypertension   Relevant Medications   metoprolol tartrate (LOPRESSOR) tablet   Other Relevant Orders   CBC w/Diff (Completed)   IBC panel (Completed)   Ferritin (Completed)    Other Visit Diagnoses    Vaginitis and vulvovaginitis    -  Primary    Relevant Orders    CBC w/Diff (Completed)    IBC panel (Completed)    Ferritin (Completed)    Urine frequency        Relevant Orders    POCT Urinalysis Dipstick (Completed)  CBC w/Diff (Completed)    IBC panel (Completed)    Ferritin (Completed)    Glucosuria        Relevant Orders    POCT Glucose (CBG) (Completed)    CBC w/Diff (Completed)    IBC panel (Completed)    Ferritin (Completed)    Chronic pain syndrome        Relevant Medications    HYDROcodone-acetaminophen (NORCO) 10-325 MG per tablet    LORazepam (ATIVAN) tablet    traMADol (ULTRAM) 50 MG tablet    Other Relevant Orders    CBC w/Diff (Completed)    IBC panel (Completed)    Ferritin (Completed)    Diabetes mellitus type 2, uncontrolled        Relevant Medications    insulin glulisine (APIDRA) 100 UNIT/ML injection    Other Relevant Orders    CBC w/Diff (Completed)    IBC panel (Completed)    Ferritin (Completed)       Follow-up: Return in about 3 months (around 05/22/2015), or if symptoms worsen or fail to improve, for f/u diabetes.  Garnet Koyanagi, DO

## 2015-02-19 NOTE — Progress Notes (Signed)
Pre visit review using our clinic review tool, if applicable. No additional management support is needed unless otherwise documented below in the visit note.

## 2015-02-19 NOTE — Patient Instructions (Addendum)
F/u with Endo---- make sure you are taking the correct dose of insulin or the yeast infections will not resolve      Diabetes and Standards of Medical Care Diabetes is complicated. You may find that your diabetes team includes a dietitian, nurse, diabetes educator, eye doctor, and more. To help everyone know what is going on and to help you get the care you deserve, the following schedule of care was developed to help keep you on track. Below are the tests, exams, vaccines, medicines, education, and plans you will need. HbA1c test This test shows how well you have controlled your glucose over the past 2-3 months. It is used to see if your diabetes management plan needs to be adjusted.   It is performed at least 2 times a year if you are meeting treatment goals.  It is performed 4 times a year if therapy has changed or if you are not meeting treatment goals. Blood pressure test  This test is performed at every routine medical visit. The goal is less than 140/90 mm Hg for most people, but 130/80 mm Hg in some cases. Ask your health care provider about your goal. Dental exam  Follow up with the dentist regularly. Eye exam  If you are diagnosed with type 1 diabetes as a child, get an exam upon reaching the age of 20 years or older and have had diabetes for 3-5 years. Yearly eye exams are recommended after that initial eye exam.  If you are diagnosed with type 1 diabetes as an adult, get an exam within 5 years of diagnosis and then yearly.  If you are diagnosed with type 2 diabetes, get an exam as soon as possible after the diagnosis and then yearly. Foot care exam  Visual foot exams are performed at every routine medical visit. The exams check for cuts, injuries, or other problems with the feet.  A comprehensive foot exam should be done yearly. This includes visual inspection as well as assessing foot pulses and testing for loss of sensation.  Check your feet nightly for cuts, injuries,  or other problems with your feet. Tell your health care provider if anything is not healing. Kidney function test (urine microalbumin)  This test is performed once a year.  Type 1 diabetes: The first test is performed 5 years after diagnosis.  Type 2 diabetes: The first test is performed at the time of diagnosis.  A serum creatinine and estimated glomerular filtration rate (eGFR) test is done once a year to assess the level of chronic kidney disease (CKD), if present. Lipid profile (cholesterol, HDL, LDL, triglycerides)  Performed every 5 years for most people.  The goal for LDL is less than 100 mg/dL. If you are at high risk, the goal is less than 70 mg/dL.  The goal for HDL is 40 mg/dL-50 mg/dL for men and 50 mg/dL-60 mg/dL for women. An HDL cholesterol of 60 mg/dL or higher gives some protection against heart disease.  The goal for triglycerides is less than 150 mg/dL. Influenza vaccine, pneumococcal vaccine, and hepatitis B vaccine  The influenza vaccine is recommended yearly.  It is recommended that people with diabetes who are over 24 years old get the pneumonia vaccine. In some cases, two separate shots may be given. Ask your health care provider if your pneumonia vaccination is up to date.  The hepatitis B vaccine is also recommended for adults with diabetes. Diabetes self-management education  Education is recommended at diagnosis and ongoing as needed. Treatment  plan  Your treatment plan is reviewed at every medical visit. Document Released: 09/17/2009 Document Revised: 04/06/2014 Document Reviewed: 04/22/2013 [facility] Patient Information 2015 [place], [place]. This information is not intended to replace advice given to you by your health care provider. Make sure you discuss any questions you have with your health care provider.

## 2015-02-20 LAB — FERRITIN: Ferritin: 166 ng/mL (ref 10–291)

## 2015-02-24 LAB — IRON: Iron: 105 ug/dL (ref 42–145)

## 2015-02-24 LAB — IBC PANEL
%SAT: 31 % (ref 20–55)
TIBC: 342 ug/dL (ref 250–470)
UIBC: 237 ug/dL (ref 125–400)

## 2015-03-03 ENCOUNTER — Telehealth: Payer: Self-pay | Admitting: Hematology & Oncology

## 2015-03-03 NOTE — Telephone Encounter (Signed)
Pt left message wanting appointment, she is aware of 4-1 she may call back to cx said she wasn't sure she could make that.

## 2015-03-05 ENCOUNTER — Other Ambulatory Visit (HOSPITAL_BASED_OUTPATIENT_CLINIC_OR_DEPARTMENT_OTHER): Payer: Medicare Other

## 2015-03-05 ENCOUNTER — Ambulatory Visit (HOSPITAL_BASED_OUTPATIENT_CLINIC_OR_DEPARTMENT_OTHER): Payer: Medicare Other | Admitting: Hematology & Oncology

## 2015-03-05 ENCOUNTER — Encounter: Payer: Self-pay | Admitting: Hematology & Oncology

## 2015-03-05 VITALS — BP 137/61 | HR 62 | Temp 97.9°F | Resp 14 | Ht 63.0 in | Wt 196.0 lb

## 2015-03-05 DIAGNOSIS — K7581 Nonalcoholic steatohepatitis (NASH): Secondary | ICD-10-CM

## 2015-03-05 DIAGNOSIS — D696 Thrombocytopenia, unspecified: Secondary | ICD-10-CM

## 2015-03-05 DIAGNOSIS — R161 Splenomegaly, not elsewhere classified: Secondary | ICD-10-CM | POA: Diagnosis not present

## 2015-03-05 DIAGNOSIS — E119 Type 2 diabetes mellitus without complications: Secondary | ICD-10-CM | POA: Diagnosis not present

## 2015-03-05 DIAGNOSIS — K7460 Unspecified cirrhosis of liver: Secondary | ICD-10-CM

## 2015-03-05 LAB — CBC WITH DIFFERENTIAL (CANCER CENTER ONLY)
BASO#: 0 10*3/uL (ref 0.0–0.2)
BASO%: 0.5 % (ref 0.0–2.0)
EOS ABS: 0.1 10*3/uL (ref 0.0–0.5)
EOS%: 1.9 % (ref 0.0–7.0)
HCT: 45.8 % (ref 34.8–46.6)
HEMOGLOBIN: 16 g/dL — AB (ref 11.6–15.9)
LYMPH#: 1.3 10*3/uL (ref 0.9–3.3)
LYMPH%: 20.1 % (ref 14.0–48.0)
MCH: 30.8 pg (ref 26.0–34.0)
MCHC: 34.9 g/dL (ref 32.0–36.0)
MCV: 88 fL (ref 81–101)
MONO#: 0.5 10*3/uL (ref 0.1–0.9)
MONO%: 8.1 % (ref 0.0–13.0)
NEUT#: 4.5 10*3/uL (ref 1.5–6.5)
NEUT%: 69.4 % (ref 39.6–80.0)
Platelets: 125 10*3/uL — ABNORMAL LOW (ref 145–400)
RBC: 5.2 10*6/uL (ref 3.70–5.32)
RDW: 12.9 % (ref 11.1–15.7)
WBC: 6.4 10*3/uL (ref 3.9–10.0)

## 2015-03-05 LAB — CHCC SATELLITE - SMEAR

## 2015-03-05 NOTE — Progress Notes (Signed)
Hematology and Oncology Follow Up Visit  Margaret Dyer [id number] 1935/08/26 80 y.o. 03/05/2015   Principle Diagnosis:   Thrombocytopenia  Cirrhosis -NASH with splenomegaly  History of renal cell carcinoma of the left kidney-resected in 2000  Current Therapy:    Observation     Interim History:  Margaret Dyer is back for followup. We last saw her back in June of last year. Her platelet count was slightly low. She has had no problems with bleeding. She has had some bruising.  She has poor controlled diabetes. This, in my mind, we'll be her biggest problem. She has some cirrhosis. She has NASH. Because of this, she has splenomegaly. Her last CT scan was done about 3 years ago. I'm sure that she has progressive cirrhosis and probably progressive splenomegaly.  There's been no change in her medications. She does have chronic pain.  Her weight is coming down. She's trying to lose some weight. Medications:  Current outpatient prescriptions:  .  amLODipine (NORVASC) 10 MG tablet, take 1 tablet by mouth once daily, Disp: 30 tablet, Rfl: 5 .  ezetimibe-simvastatin (VYTORIN) 10-20 MG per tablet, Take 1 tablet by mouth at bedtime., Disp: , Rfl:  .  HYDROcodone-acetaminophen (NORCO) 10-325 MG per tablet, take 1 tablet by mouth every 8 hours if needed for pain, Disp: 60 tablet, Rfl: 0 .  insulin glargine (LANTUS) 100 UNIT/ML injection, Inject 55 Units into the skin every evening. , Disp: , Rfl:  .  insulin glulisine (APIDRA) 100 UNIT/ML injection, 40 u sq in am and 80 u sq q pm, Disp: 10 mL, Rfl: 11 .  levothyroxine (SYNTHROID, LEVOTHROID) 25 MCG tablet, Take 25 mcg by mouth daily. , Disp: , Rfl:  .  LORazepam (ATIVAN) 0.5 MG tablet, take 1 tablet by mouth twice a day if needed for anxiety, Disp: 60 tablet, Rfl: 1 .  losartan (COZAAR) 100 MG tablet, Take 1 tablet (100 mg total) by mouth daily., Disp: 30 tablet, Rfl: 5 .  metoprolol tartrate (LOPRESSOR) 25 MG tablet, Take 1 tablet (25 mg total) by  mouth 2 (two) times daily., Disp: 180 tablet, Rfl: 0 .  ondansetron (ZOFRAN) 4 MG tablet, Take 1 tablet (4 mg total) by mouth every 8 (eight) hours as needed for nausea or vomiting., Disp: 15 tablet, Rfl: 1 .  ondansetron (ZOFRAN-ODT) 8 MG disintegrating tablet, Take 1 tablet (8 mg total) by mouth every 12 (twelve) hours as needed for nausea., Disp: 20 tablet, Rfl: 0 .  pantoprazole (PROTONIX) 40 MG tablet, Take 40 mg by mouth daily. , Disp: , Rfl:  .  traMADol (ULTRAM) 50 MG tablet, take 2 tablets by mouth twice a day if needed, Disp: 90 tablet, Rfl: 0 .  sertraline (ZOLOFT) 50 MG tablet, Take 1 tablet (50 mg total) by mouth daily. (Patient not taking: Reported on 03/05/2015), Disp: 30 tablet, Rfl: 3  Allergies:  Allergies  Allergen Reactions  . Cefuroxime Axetil     Upset stomach   . Ciprofloxacin     Upset stomach    Past Medical History, Surgical history, Social history, and Family History were reviewed and updated.  Review of Systems: As above  Physical Exam:  height is _0  (1.6 m) and weight is 196 lb (88.905 kg). Her oral temperature is 97.9 F (36.6 C). Her blood pressure is 137/61 and her pulse is 62. Her respiration is 14.   Somewhat obese white female. Her head and neck exam shows no ocular or oral lesion. She has  no scleral icterus. There is no adenopathy in the neck. Lungs are clear. Cardiac exam regular rate and rhythm with no murmurs rubs or bruits. Abdomen is soft. She is moderately obese. She has good bowel sounds. There is no fluid wave. I cannot palpate a liver edge. Her spleen to might be palpable with deep inspiration. Extremities shows some trace edema. Has chronic diabetic skin changes in her lower legs.. She is decent strength. Skin exam no petechia, ecchymosis or rashes. She has a diabetic dermatitis in the lower legs. Neurological exam is non-focal.  Lab Results  Component Value Date   WBC 6.4 03/05/2015   HGB 16.0* 03/05/2015   HCT 45.8 03/05/2015   MCV 88  03/05/2015   PLT 125 Platelet count consistent in citrate* 03/05/2015     Chemistry      Component Value Date/Time   NA 134* 01/19/2015 1659   K 4.8 01/19/2015 1659   CL 98 01/19/2015 1659   CO2 28 01/19/2015 1659   BUN 19 01/19/2015 1659   CREATININE 0.99 01/19/2015 1659      Component Value Date/Time   CALCIUM 9.6 01/19/2015 1659   ALKPHOS 88 01/19/2015 1659   AST 26 01/19/2015 1659   ALT 35 01/19/2015 1659   BILITOT 0.5 01/19/2015 1659         Impression and Plan: Ms. Peavler is 80 year old white female with thrombocytopenia. Her platelet count is pretty much holding  steady right now.   Her blood smear looks okay. I don't see any immature red cells. There are no nucleated red blood cells. I see no immature myeloid cells. There are no hypersegmented polys. Platelets are mildly decreased in number but well granulated. She has several large platelets.  I do not see any indication for a bone marrow test. Again I suspect that all this is from her having cirrhosis with splenomegaly. The spleen is act as a "sponge" and causing a drop in her platelets. Her platelets are very functional. She should never have any bleeding issues unless she has varices that might bleed.  I feel bad that she is under so much stress at home. Her husband has Alzheimer's. Her grandson has bipolar. She has poor controlled diabetes.  For now, we'll just plan to get her back to see [place] in 3-4 months I still do not see any indication for her to have additional studies. Volanda Napoleon, MD 4/1/201612:38 PM

## 2015-03-22 ENCOUNTER — Telehealth: Payer: Self-pay | Admitting: Family Medicine

## 2015-03-22 DIAGNOSIS — G894 Chronic pain syndrome: Secondary | ICD-10-CM

## 2015-03-22 NOTE — Telephone Encounter (Signed)
She prob needs ortho for her shoulder If she is ok with that-- we an see how fast we can get her in and get pain meds if it will not be soon

## 2015-03-22 NOTE — Telephone Encounter (Signed)
Caller name: [name] Relation to pt: self Call back number: [phone] or [phone] Pharmacy:  Reason for call:   Requesting hydrocodone refill. Patient states that she is having increasing pain in shoulder and back.(bursitis).

## 2015-03-22 NOTE — Telephone Encounter (Signed)
Last seen 01/19/25 and filled 02/19/15 #60 UDS 01/12/15 Neg for everything and we fill every 30 days   Please advise KP

## 2015-03-22 NOTE — Telephone Encounter (Signed)
Husband said she was not home. I will call tomorrow.      KP

## 2015-03-23 DIAGNOSIS — Z79899 Other long term (current) drug therapy: Secondary | ICD-10-CM | POA: Diagnosis not present

## 2015-03-23 DIAGNOSIS — Z79891 Long term (current) use of opiate analgesic: Secondary | ICD-10-CM | POA: Diagnosis not present

## 2015-03-23 MED ORDER — HYDROCODONE-ACETAMINOPHEN 10-325 MG PO TABS: ORAL_TABLET | ORAL | Status: DC

## 2015-03-23 NOTE — Telephone Encounter (Signed)
VM left advising Rx ready for pick up.     KP

## 2015-03-23 NOTE — Telephone Encounter (Signed)
[facility] # [phone]

## 2015-03-23 NOTE — Telephone Encounter (Signed)
Spoke with patient and she said she is in really bad pain, she has an apt with Ortho next week but would like to get her pain med's refilled because she is completely out.    Please advise      KP

## 2015-03-23 NOTE — Telephone Encounter (Signed)
Refill x1 

## 2015-03-30 DIAGNOSIS — M7542 Impingement syndrome of left shoulder: Secondary | ICD-10-CM | POA: Diagnosis not present

## 2015-03-30 DIAGNOSIS — M25512 Pain in left shoulder: Secondary | ICD-10-CM | POA: Diagnosis not present

## 2015-03-30 DIAGNOSIS — M7541 Impingement syndrome of right shoulder: Secondary | ICD-10-CM | POA: Diagnosis not present

## 2015-03-30 DIAGNOSIS — M25511 Pain in right shoulder: Secondary | ICD-10-CM | POA: Diagnosis not present

## 2015-04-09 ENCOUNTER — Telehealth: Payer: Self-pay | Admitting: Family Medicine

## 2015-04-09 NOTE — Telephone Encounter (Signed)
Caller name: Prescious Relation to pt: self Call back number: [phone] [facility] [address]  Reason for call:   Requesting tramadol refill

## 2015-04-09 NOTE — Telephone Encounter (Signed)
Spoke with patient and I made her aware that both of her recent UDS's were negative and Dr.Lowne will no longer prescribe any pain med's she said she seen Ortho and they will not give her the cortisone shot because of her glucose, i made her aware she could call them to get her pain med's because Dr.Lowne has decided not to write them anymore. She verbalized understanding.     KP

## 2015-05-06 ENCOUNTER — Telehealth: Payer: Self-pay

## 2015-05-06 ENCOUNTER — Ambulatory Visit: Payer: PRIVATE HEALTH INSURANCE | Admitting: Family

## 2015-05-06 DIAGNOSIS — G894 Chronic pain syndrome: Secondary | ICD-10-CM

## 2015-05-06 MED ORDER — LORAZEPAM 0.5 MG PO TABS: ORAL_TABLET | ORAL | Status: DC

## 2015-05-06 NOTE — Telephone Encounter (Signed)
error

## 2015-05-06 NOTE — Telephone Encounter (Signed)
Refill x1 

## 2015-05-06 NOTE — Telephone Encounter (Signed)
Last seen and filled 02/19/15 #60 with 1  UDS 01/08/15 Neg for all meds.   Please advise     KP

## 2015-05-06 NOTE — Telephone Encounter (Signed)
Rx faxed.      KP

## 2015-05-10 ENCOUNTER — Other Ambulatory Visit: Payer: Self-pay

## 2015-05-10 MED ORDER — LOSARTAN POTASSIUM 100 MG PO TABS: 100.0000 mg | ORAL_TABLET | Freq: Every day | ORAL | Status: DC

## 2015-05-12 ENCOUNTER — Ambulatory Visit: Payer: Medicare Other | Admitting: Family Medicine

## 2015-05-12 DIAGNOSIS — R921 Mammographic calcification found on diagnostic imaging of breast: Secondary | ICD-10-CM | POA: Diagnosis not present

## 2015-05-12 DIAGNOSIS — Z803 Family history of malignant neoplasm of breast: Secondary | ICD-10-CM | POA: Diagnosis not present

## 2015-05-12 LAB — HM MAMMOGRAPHY

## 2015-05-21 ENCOUNTER — Encounter: Payer: Self-pay | Admitting: General Practice

## 2015-06-02 ENCOUNTER — Ambulatory Visit: Payer: PRIVATE HEALTH INSURANCE | Admitting: Hematology & Oncology

## 2015-06-02 ENCOUNTER — Other Ambulatory Visit: Payer: Medicare Other

## 2015-06-25 ENCOUNTER — Telehealth: Payer: Self-pay

## 2015-06-25 DIAGNOSIS — G894 Chronic pain syndrome: Secondary | ICD-10-CM

## 2015-06-25 MED ORDER — LORAZEPAM 0.5 MG PO TABS: ORAL_TABLET | ORAL | Status: DC

## 2015-06-25 NOTE — Telephone Encounter (Signed)
Refill x1

## 2015-06-25 NOTE — Telephone Encounter (Signed)
Last seen 02/19/15 and filled 05/06/15 #60 UDS 03/23/15 low risk   Please advise      KP

## 2015-06-25 NOTE — Telephone Encounter (Signed)
Rx faxed.    KP 

## 2015-06-29 DIAGNOSIS — F4323 Adjustment disorder with mixed anxiety and depressed mood: Secondary | ICD-10-CM | POA: Diagnosis not present

## 2015-07-22 DIAGNOSIS — F4323 Adjustment disorder with mixed anxiety and depressed mood: Secondary | ICD-10-CM | POA: Diagnosis not present

## 2015-08-02 ENCOUNTER — Telehealth: Payer: Self-pay | Admitting: Family Medicine

## 2015-08-02 DIAGNOSIS — G894 Chronic pain syndrome: Secondary | ICD-10-CM

## 2015-08-02 MED ORDER — LORAZEPAM 0.5 MG PO TABS: ORAL_TABLET | ORAL | Status: DC

## 2015-08-02 NOTE — Telephone Encounter (Signed)
Last seen 02/19/15 and filled 06/25/15 #60   Please advise     KP

## 2015-08-02 NOTE — Telephone Encounter (Signed)
Rx will be faxed as time permits today.     KP

## 2015-08-02 NOTE — Telephone Encounter (Signed)
Caller name:Lewanda Relationship to patient:self Can be reached: Pharmacy: [facility] [address]   Reason for call:ativan refill

## 2015-08-02 NOTE — Telephone Encounter (Signed)
Refill x1

## 2015-08-02 NOTE — Telephone Encounter (Signed)
Pt called in again to check status. She said pharmacy requested last week and she is out of meds. Please call in today.

## 2015-08-03 ENCOUNTER — Encounter: Payer: Self-pay | Admitting: Family Medicine

## 2015-08-03 ENCOUNTER — Ambulatory Visit (INDEPENDENT_AMBULATORY_CARE_PROVIDER_SITE_OTHER): Payer: Medicare Other | Admitting: Family Medicine

## 2015-08-03 VITALS — BP 140/78 | HR 71 | Temp 98.7°F | Wt 197.0 lb

## 2015-08-03 DIAGNOSIS — N644 Mastodynia: Secondary | ICD-10-CM

## 2015-08-03 DIAGNOSIS — G894 Chronic pain syndrome: Secondary | ICD-10-CM | POA: Diagnosis not present

## 2015-08-03 DIAGNOSIS — R110 Nausea: Secondary | ICD-10-CM

## 2015-08-03 MED ORDER — ACYCLOVIR 400 MG PO TABS: 800.0000 mg | ORAL_TABLET | Freq: Every day | ORAL | Status: DC

## 2015-08-03 MED ORDER — TRAMADOL HCL 50 MG PO TABS: ORAL_TABLET | ORAL | Status: DC

## 2015-08-03 MED ORDER — ONDANSETRON HCL 4 MG PO TABS: 4.0000 mg | ORAL_TABLET | Freq: Three times a day (TID) | ORAL | Status: DC | PRN

## 2015-08-03 MED ORDER — GABAPENTIN 100 MG PO CAPS: 100.0000 mg | ORAL_CAPSULE | Freq: Every day | ORAL | Status: DC

## 2015-08-03 NOTE — Patient Instructions (Signed)

## 2015-08-03 NOTE — Progress Notes (Signed)
Pre visit review using our clinic review tool, if applicable. No additional management support is needed unless otherwise documented below in the visit note.

## 2015-08-03 NOTE — Progress Notes (Signed)
Patient ID: Margaret Dyer, female   DOB: 29-Nov-1935, 80 y.o.   MRN: [id number]   Subjective:    Patient ID: Margaret Dyer, female    DOB: 1935/10/06, 80 y.o.   MRN: [id number]  Chief Complaint  Patient presents with  . Breast Pain    x's 1 mo feels like a burning sensation    HPI  Patient is in today for breast pain on L x 1 month.  Diagnostic mammogram neg.  No radiation of pain.  No palpitations. No sob.    Past Medical History  Diagnosis Date  . Asthma   . Diabetes mellitus   . Osteopenia   . Renal cell carcinoma   . Goiter   . Hypertension   . Hyperlipidemia   . Blood transfusion     Past Surgical History  Procedure Laterality Date  . Nephrectomy  09.2000  . Appendectomy    . Abdominal hysterectomy    . Knee surgery    . Hemorrhoid surgery      Family History  Problem Relation Age of Onset  . Cancer Sister     bladder  . Kidney cancer Father   . Cancer Father     renal  . Coronary artery disease    . Diabetes    . Hyperlipidemia    . Hypertension      Social History   Social History  . Marital Status: Married    Spouse Name: N/A  . Number of Children: N/A  . Years of Education: N/A   Occupational History  . Not on file.   Social History Main Topics  . Smoking status: Former Research scientist (life sciences)  . Smokeless tobacco: Never Used     Comment: never used tobacco  . Alcohol Use: No  . Drug Use: No  . Sexual Activity: Not on file   Other Topics Concern  . Not on file   Social History Narrative    Outpatient Prescriptions Prior to Visit  Medication Sig Dispense Refill  . amLODipine (NORVASC) 10 MG tablet take 1 tablet by mouth once daily 30 tablet 5  . ezetimibe-simvastatin (VYTORIN) 10-20 MG per tablet Take 1 tablet by mouth at bedtime.    . insulin glargine (LANTUS) 100 UNIT/ML injection Inject 55 Units into the skin every evening.     . insulin glulisine (APIDRA) 100 UNIT/ML injection 40 u sq in am and 80 u sq q pm 10 mL 11  . levothyroxine (SYNTHROID,  LEVOTHROID) 25 MCG tablet Take 25 mcg by mouth daily.     Marland Kitchen LORazepam (ATIVAN) 0.5 MG tablet take 1 tablet by mouth twice a day if needed for anxiety 60 tablet 0  . losartan (COZAAR) 100 MG tablet Take 1 tablet (100 mg total) by mouth daily. 30 tablet 5  . metoprolol tartrate (LOPRESSOR) 25 MG tablet Take 1 tablet (25 mg total) by mouth 2 (two) times daily. 180 tablet 0  . pantoprazole (PROTONIX) 40 MG tablet Take 40 mg by mouth daily.     . ondansetron (ZOFRAN) 4 MG tablet Take 1 tablet (4 mg total) by mouth every 8 (eight) hours as needed for nausea or vomiting. 15 tablet 1  . traMADol (ULTRAM) 50 MG tablet take 2 tablets by mouth twice a day if needed (Patient taking differently: Take 100 mg by mouth 2 (two) times daily. take 2 tablets by mouth twice a day if needed) 90 tablet 0  . HYDROcodone-acetaminophen (NORCO) 10-325 MG per tablet take 1 tablet by mouth  every 8 hours if needed for pain 60 tablet 0  . ondansetron (ZOFRAN-ODT) 8 MG disintegrating tablet Take 1 tablet (8 mg total) by mouth every 12 (twelve) hours as needed for nausea. 20 tablet 0  . sertraline (ZOLOFT) 50 MG tablet Take 1 tablet (50 mg total) by mouth daily. (Patient not taking: Reported on 03/05/2015) 30 tablet 3   No facility-administered medications prior to visit.    Allergies  Allergen Reactions  . Cefuroxime Axetil     Upset stomach   . Ciprofloxacin     Upset stomach    Review of Systems  Constitutional: Negative for fever and malaise/fatigue.  HENT: Negative for congestion.   Eyes: Negative for discharge.  Respiratory: Negative for shortness of breath.   Cardiovascular: Negative for chest pain, palpitations and leg swelling.  Gastrointestinal: Negative for nausea and abdominal pain.  Genitourinary: Negative for dysuria.       L breast pain x 1 month  Musculoskeletal: Negative for falls.  Skin: Negative for rash.  Neurological: Negative for loss of consciousness and headaches.  Endo/Heme/Allergies:  Negative for environmental allergies.  Psychiatric/Behavioral: Negative for depression. The patient is not nervous/anxious.        Objective:    Physical Exam  Constitutional: She is oriented to person, place, and time. She appears well-developed and well-nourished. No distress.  HENT:  Head: Normocephalic and atraumatic.  Right Ear: Tympanic membrane normal.  Left Ear: Tympanic membrane normal.  Nose: Mucosal edema and rhinorrhea present. Right sinus exhibits maxillary sinus tenderness and frontal sinus tenderness. Left sinus exhibits maxillary sinus tenderness and frontal sinus tenderness.  Mouth/Throat: Uvula is midline and mucous membranes are normal. Posterior oropharyngeal erythema present. No oropharyngeal exudate.  Eyes: Conjunctivae and EOM are normal. Pupils are equal, round, and reactive to light.  Neck: Normal range of motion. Neck supple. No JVD present. Carotid bruit is not present. No thyromegaly present.  Cardiovascular: Normal rate, regular rhythm and normal heart sounds.   No murmur heard. Pulmonary/Chest: Effort normal and breath sounds normal. No respiratory distress. She has no wheezes. She has no rales. She exhibits no tenderness. Right breast exhibits no inverted nipple, no mass, no nipple discharge, no skin change and no tenderness. Left breast exhibits tenderness. Left breast exhibits no inverted nipple, no mass, no nipple discharge and no skin change. Breasts are symmetrical.    Musculoskeletal: She exhibits no edema.  Lymphadenopathy:    She has no cervical adenopathy.  Neurological: She is alert and oriented to person, place, and time.  Psychiatric: She has a normal mood and affect.    BP 140/78 mmHg  Pulse 71  Temp(Src) 98.7 F (37.1 C) (Oral)  Wt 197 lb (89.359 kg)  SpO2 95%  LMP  (LMP Unknown) Wt Readings from Last 3 Encounters:  08/03/15 197 lb (89.359 kg)  03/05/15 196 lb (88.905 kg)  02/19/15 199 lb (90.266 kg)     Lab Results  Component  Value Date   WBC 6.4 03/05/2015   HGB 16.0* 03/05/2015   HCT 45.8 03/05/2015   PLT 125 Platelet count consistent in citrate* 03/05/2015   GLUCOSE 444* 01/19/2015   CHOL 155 01/19/2015   TRIG 202.0* 01/19/2015   HDL 41.90 01/19/2015   LDLDIRECT 89.0 01/19/2015   LDLCALC 49 05/31/2012   ALT 35 01/19/2015   AST 26 01/19/2015   NA 134* 01/19/2015   K 4.8 01/19/2015   CL 98 01/19/2015   CREATININE 0.99 01/19/2015   BUN 19 01/19/2015   CO2  28 01/19/2015   TSH 0.559 05/08/2014   HGBA1C 9.5* 11/01/2011   MICROALBUR 74.9* 11/01/2011    Lab Results  Component Value Date   TSH 0.559 05/08/2014   Lab Results  Component Value Date   WBC 6.4 03/05/2015   HGB 16.0* 03/05/2015   HCT 45.8 03/05/2015   MCV 88 03/05/2015   PLT 125 Platelet count consistent in citrate* 03/05/2015   Lab Results  Component Value Date   NA 134* 01/19/2015   K 4.8 01/19/2015   CO2 28 01/19/2015   GLUCOSE 444* 01/19/2015   BUN 19 01/19/2015   CREATININE 0.99 01/19/2015   BILITOT 0.5 01/19/2015   ALKPHOS 88 01/19/2015   AST 26 01/19/2015   ALT 35 01/19/2015   PROT 6.8 01/19/2015   ALBUMIN 3.8 01/19/2015   CALCIUM 9.6 01/19/2015   GFR 57.71* 01/19/2015   Lab Results  Component Value Date   CHOL 155 01/19/2015   Lab Results  Component Value Date   HDL 41.90 01/19/2015   Lab Results  Component Value Date   LDLCALC 49 05/31/2012   Lab Results  Component Value Date   TRIG 202.0* 01/19/2015   Lab Results  Component Value Date   CHOLHDL 4 01/19/2015   Lab Results  Component Value Date   HGBA1C 9.5* 11/01/2011       Assessment & Plan:   Problem List Items Addressed This Visit    None    Visit Diagnoses    Chronic pain syndrome    -  Primary    Relevant Medications    traMADol (ULTRAM) 50 MG tablet    Nausea without vomiting        Relevant Medications    ondansetron (ZOFRAN) 4 MG tablet    Breast pain        Relevant Orders    EKG 12-Lead (Completed)       I have  discontinued Ms. Hoard's sertraline and HYDROcodone-acetaminophen. I am also having her start on gabapentin and acyclovir. Additionally, I am having her maintain her insulin glargine, pantoprazole, levothyroxine, ezetimibe-simvastatin, amLODipine, metoprolol tartrate, insulin glulisine, losartan, LORazepam, traMADol, and ondansetron.  Meds ordered this encounter  Medications  . traMADol (ULTRAM) 50 MG tablet    Sig: take 2 tablets by mouth twice a day if needed    Dispense:  90 tablet    Refill:  0  . ondansetron (ZOFRAN) 4 MG tablet    Sig: Take 1 tablet (4 mg total) by mouth every 8 (eight) hours as needed for nausea or vomiting.    Dispense:  15 tablet    Refill:  1  . gabapentin (NEURONTIN) 100 MG capsule    Sig: Take 1 capsule (100 mg total) by mouth at bedtime.    Dispense:  30 capsule    Refill:  0  . acyclovir (ZOVIRAX) 400 MG tablet    Sig: Take 2 tablets (800 mg total) by mouth 5 (five) times daily.    Dispense:  50 tablet    Refill:  0     Garnet Koyanagi, DO

## 2015-08-13 ENCOUNTER — Other Ambulatory Visit: Payer: Self-pay | Admitting: Family

## 2015-08-13 NOTE — Telephone Encounter (Signed)
Rx sent to the pharmacy by e-script.//AB/CMA 

## 2015-08-26 DIAGNOSIS — F4323 Adjustment disorder with mixed anxiety and depressed mood: Secondary | ICD-10-CM | POA: Diagnosis not present

## 2015-09-01 ENCOUNTER — Other Ambulatory Visit: Payer: Self-pay | Admitting: Family Medicine

## 2015-09-01 NOTE — Telephone Encounter (Signed)
Last OV 08/03/15 Last Refill 08/03/15 Please advise refill.

## 2015-09-02 ENCOUNTER — Telehealth: Payer: Self-pay

## 2015-09-02 MED ORDER — TRAMADOL HCL 50 MG PO TABS: ORAL_TABLET | ORAL | Status: DC

## 2015-09-02 NOTE — Telephone Encounter (Signed)
Spoke with pt and she is not happy about the Ultram not being filled at this time and she is still having some arm and breast pain but she is not willing to come and see Dr. Etter Sjogren due to financial reasons. FYI//HM

## 2015-09-02 NOTE — Telephone Encounter (Signed)
noted 

## 2015-09-02 NOTE — Addendum Note (Signed)
Addended by: Tasia Catchings on: 09/02/2015 08:19 AM   Modules accepted: Orders

## 2015-09-06 ENCOUNTER — Other Ambulatory Visit: Payer: Self-pay | Admitting: Family Medicine

## 2015-09-06 ENCOUNTER — Telehealth: Payer: Self-pay | Admitting: Family Medicine

## 2015-09-06 NOTE — Telephone Encounter (Signed)
Relation to UC:JARW Call back number:[phone]   Reason for call:  Patient requesting a refill traMADol (ULTRAM) 50 MG table and LORazepam (ATIVAN) 0.5 MG tablet

## 2015-09-06 NOTE — Telephone Encounter (Signed)
Last seen 08/03/15 and filled 08/02/15 #60 Per 04/02/15 UDS unable to write anymore controlled.  Please advise     KP

## 2015-09-06 NOTE — Telephone Encounter (Signed)
Refill x1 for ultram-------  Then in 2 weeks we will call her in and she will need to bring her bottle of pills and we will do a drug screen and pill count.

## 2015-09-06 NOTE — Telephone Encounter (Signed)
Spoke with patient and she stated she feels like you deserted her. She said she needs a refill on her pain med's and she will not go back to pain management and it did not help. She said she does not know why her UDS's were all Negative, she said she has tried OTC med's and they do not help. She said she just needs enough to get her through. Please advise      KP

## 2015-09-07 ENCOUNTER — Other Ambulatory Visit: Payer: Self-pay | Admitting: Family Medicine

## 2015-09-07 MED ORDER — TRAMADOL HCL 50 MG PO TABS: ORAL_TABLET | ORAL | Status: DC

## 2015-09-07 NOTE — Telephone Encounter (Signed)
Pt called to check status of RX. Scheduled appt for 09/23/15 2:30pm. I advised her she has to keep appt.

## 2015-09-07 NOTE — Telephone Encounter (Signed)
Rx will be faxed today once signed by the MD.     KP

## 2015-09-07 NOTE — Telephone Encounter (Signed)
She will have to come in next 2-3 weeks---that is the deal

## 2015-09-07 NOTE — Telephone Encounter (Signed)
Reason for call:  Patient requesting Rx please send to  [id number] [place] [phone](Phone) [phone](Fax)

## 2015-09-07 NOTE — Telephone Encounter (Signed)
Patient stated that she could not schedule an apt right now. She will call back later today schedule. She requested the Rx to go to [facility].    KP

## 2015-09-21 ENCOUNTER — Other Ambulatory Visit: Payer: Self-pay | Admitting: Family Medicine

## 2015-09-23 ENCOUNTER — Encounter: Payer: Self-pay | Admitting: Family Medicine

## 2015-09-23 ENCOUNTER — Ambulatory Visit (INDEPENDENT_AMBULATORY_CARE_PROVIDER_SITE_OTHER): Payer: Medicare Other | Admitting: Family Medicine

## 2015-09-23 VITALS — BP 126/64 | HR 64 | Temp 98.3°F | Ht 63.0 in | Wt 196.0 lb

## 2015-09-23 DIAGNOSIS — Z79899 Other long term (current) drug therapy: Secondary | ICD-10-CM | POA: Diagnosis not present

## 2015-09-23 DIAGNOSIS — Z79891 Long term (current) use of opiate analgesic: Secondary | ICD-10-CM | POA: Diagnosis not present

## 2015-09-23 DIAGNOSIS — M549 Dorsalgia, unspecified: Secondary | ICD-10-CM

## 2015-09-23 DIAGNOSIS — Z23 Encounter for immunization: Secondary | ICD-10-CM

## 2015-09-23 DIAGNOSIS — E039 Hypothyroidism, unspecified: Secondary | ICD-10-CM

## 2015-09-23 MED ORDER — LEVOTHYROXINE SODIUM 25 MCG PO TABS: 25.0000 ug | ORAL_TABLET | Freq: Every day | ORAL | Status: DC

## 2015-09-23 NOTE — Progress Notes (Signed)
Pre visit review using our clinic review tool, if applicable. No additional management support is needed unless otherwise documented below in the visit note.

## 2015-09-23 NOTE — Patient Instructions (Signed)
When you get your supplement --- go ahead and get the xray done downstairs.

## 2015-09-23 NOTE — Progress Notes (Signed)
Patient ID: Margaret Dyer, female    DOB: 03-Jul-1935  Age: 80 y.o. MRN: [id number]    Subjective:  Subjective HPI Margaret Dyer presents for f/u L breast pain.  It still hurts.  Mammogram was neg.    Review of Systems  Constitutional: Negative for diaphoresis, appetite change, fatigue and unexpected weight change.  Eyes: Negative for pain, redness and visual disturbance.  Respiratory: Negative for cough, chest tightness, shortness of breath and wheezing.   Cardiovascular: Negative for chest pain, palpitations and leg swelling.  Endocrine: Negative for cold intolerance, heat intolerance, polydipsia, polyphagia and polyuria.  Genitourinary: Negative for dysuria, frequency and difficulty urinating.  Neurological: Negative for dizziness, light-headedness, numbness and headaches.  All other systems reviewed and are negative.   History Past Medical History  Diagnosis Date  . Asthma   . Diabetes mellitus   . Osteopenia   . Renal cell carcinoma   . Goiter   . Hypertension   . Hyperlipidemia   . Blood transfusion     She has past surgical history that includes Nephrectomy (09.2000); Appendectomy; Abdominal hysterectomy; Knee surgery; and Hemorrhoid surgery.   Her family history includes Cancer in her father and sister; Coronary artery disease in an other family member; Diabetes in an other family member; Hyperlipidemia in an other family member; Hypertension in an other family member; Kidney cancer in her father.She reports that she has quit smoking. She has never used smokeless tobacco. She reports that she does not drink alcohol or use illicit drugs.  Current Outpatient Prescriptions on File Prior to Visit  Medication Sig Dispense Refill  . amLODipine (NORVASC) 10 MG tablet take 1 tablet by mouth once daily 30 tablet 5  . ezetimibe-simvastatin (VYTORIN) 10-20 MG per tablet Take 1 tablet by mouth at bedtime.    . gabapentin (NEURONTIN) 100 MG capsule take 1 capsule by mouth at bedtime  30 capsule 2  . insulin glargine (LANTUS) 100 UNIT/ML injection Inject 55 Units into the skin every evening.     . insulin glulisine (APIDRA) 100 UNIT/ML injection 40 u sq in am and 80 u sq q pm 10 mL 11  . LORazepam (ATIVAN) 0.5 MG tablet take 1 tablet by mouth twice a day if needed for anxiety 60 tablet 0  . losartan (COZAAR) 100 MG tablet Take 1 tablet (100 mg total) by mouth daily. 30 tablet 5  . metoprolol tartrate (LOPRESSOR) 25 MG tablet Take 1 tablet (25 mg total) by mouth 2 (two) times daily. 180 tablet 0  . ondansetron (ZOFRAN) 4 MG tablet Take 1 tablet (4 mg total) by mouth every 8 (eight) hours as needed for nausea or vomiting. 15 tablet 1  . traMADol (ULTRAM) 50 MG tablet TAKE 2 TABLETS BY MOUTH TWICE A DAY AS NEEDED 90 tablet 0   No current facility-administered medications on file prior to visit.     Objective:  Objective Physical Exam  Constitutional: She is oriented to person, place, and time. She appears well-developed and well-nourished.  HENT:  Head: Normocephalic and atraumatic.  Eyes: Conjunctivae and EOM are normal.  Neck: Normal range of motion. Neck supple. No JVD present. Carotid bruit is not present. No thyromegaly present.  Cardiovascular: Normal rate, regular rhythm and normal heart sounds.   No murmur heard. Pulmonary/Chest: Effort normal and breath sounds normal. No respiratory distress. She has no wheezes. She has no rales. She exhibits no tenderness. Right breast exhibits no inverted nipple, no mass, no nipple discharge, no skin change and  no tenderness. Left breast exhibits tenderness. Left breast exhibits no inverted nipple, no mass, no nipple discharge and no skin change. Breasts are symmetrical.  Musculoskeletal: She exhibits no edema.  Neurological: She is alert and oriented to person, place, and time.  Psychiatric: She has a normal mood and affect.  Vitals reviewed.  BP 126/64 mmHg  Pulse 64  Temp(Src) 98.3 F (36.8 C) (Oral)  Ht _0  (1.6 m)   Wt 196 lb (88.905 kg)  BMI 34.73 kg/m2  SpO2 97%  LMP  (LMP Unknown) Wt Readings from Last 3 Encounters:  09/23/15 196 lb (88.905 kg)  08/03/15 197 lb (89.359 kg)  03/05/15 196 lb (88.905 kg)     Lab Results  Component Value Date   WBC 6.4 03/05/2015   HGB 16.0* 03/05/2015   HCT 45.8 03/05/2015   PLT 125 Platelet count consistent in citrate* 03/05/2015   GLUCOSE 444* 01/19/2015   CHOL 155 01/19/2015   TRIG 202.0* 01/19/2015   HDL 41.90 01/19/2015   LDLDIRECT 89.0 01/19/2015   LDLCALC 49 05/31/2012   ALT 35 01/19/2015   AST 26 01/19/2015   NA 134* 01/19/2015   K 4.8 01/19/2015   CL 98 01/19/2015   CREATININE 0.99 01/19/2015   BUN 19 01/19/2015   CO2 28 01/19/2015   TSH 0.559 05/08/2014   HGBA1C 9.5* 11/01/2011   MICROALBUR 74.9* 11/01/2011    No results found.   Assessment & Plan:  Plan I have discontinued Margaret Dyer's pantoprazole and acyclovir. I have also changed her levothyroxine. Additionally, I am having her maintain her insulin glargine, ezetimibe-simvastatin, amLODipine, metoprolol tartrate, insulin glulisine, losartan, ondansetron, LORazepam, traMADol, and gabapentin.  Meds ordered this encounter  Medications  . levothyroxine (SYNTHROID, LEVOTHROID) 25 MCG tablet    Sig: Take 1 tablet (25 mcg total) by mouth daily.    Dispense:  30 tablet    Refill:  0    Problem List Items Addressed This Visit    None    Visit Diagnoses    Mid back pain    -  Primary    Relevant Orders    DG Thoracic Spine 2 View    Hypothyroidism, unspecified hypothyroidism type        Relevant Medications    levothyroxine (SYNTHROID, LEVOTHROID) 25 MCG tablet    Need for prophylactic vaccination and inoculation against influenza        Relevant Orders    Flu vaccine HIGH DOSE PF (Fluzone High dose) (Completed)       Follow-up: Return in about 6 months (around 03/23/2016), or if symptoms worsen or fail to improve.  Garnet Koyanagi, DO

## 2015-09-28 ENCOUNTER — Telehealth: Payer: Self-pay | Admitting: Family Medicine

## 2015-09-28 NOTE — Telephone Encounter (Signed)
Pt would like call once you have results from UDS from last week.

## 2015-09-29 NOTE — Telephone Encounter (Signed)
Would you guys check on these UDS results please.    KP

## 2015-09-29 NOTE — Telephone Encounter (Signed)
I sent Margaret Dyer a message requesting UDS results, waiting for response...KMP

## 2015-09-29 NOTE — Telephone Encounter (Signed)
Pt will be called when results come back.

## 2015-09-30 ENCOUNTER — Other Ambulatory Visit: Payer: Self-pay | Admitting: Family Medicine

## 2015-09-30 MED ORDER — TRAMADOL HCL 50 MG PO TABS: ORAL_TABLET | ORAL | Status: DC

## 2015-09-30 NOTE — Telephone Encounter (Signed)
Pt calling again to check on UDS. Advised we will call when they are in.

## 2015-09-30 NOTE — Telephone Encounter (Signed)
Pt states that she has 8ud left of Tramadol. She is having a hard time sleeping due to pain. Most days she is taking 4. Sometimes less. She is asking when we can call in more medication. Ph# [phone] cell, best # for today because she is laying in the bed.

## 2015-09-30 NOTE — Telephone Encounter (Signed)
Message left advising Rx is being faxed to the pharmacy.     KP

## 2015-09-30 NOTE — Telephone Encounter (Signed)
UDS was normal (+for Tramadol) Dr.Lowne will recheck at her convenience.  Message left to call the office     KP

## 2015-09-30 NOTE — Telephone Encounter (Signed)
To MD to advise.      KP

## 2015-09-30 NOTE — Telephone Encounter (Signed)
Ok to give rx #120

## 2015-10-05 ENCOUNTER — Ambulatory Visit (INDEPENDENT_AMBULATORY_CARE_PROVIDER_SITE_OTHER): Payer: Medicare Other | Admitting: Family Medicine

## 2015-10-05 ENCOUNTER — Ambulatory Visit (HOSPITAL_BASED_OUTPATIENT_CLINIC_OR_DEPARTMENT_OTHER)
Admission: RE | Admit: 2015-10-05 | Discharge: 2015-10-05 | Disposition: A | Discharge status: Home / Self Care | Payer: Medicare Other | Source: Ambulatory Visit | Attending: Family Medicine | Admitting: Family Medicine

## 2015-10-05 ENCOUNTER — Encounter: Payer: Self-pay | Admitting: Family Medicine

## 2015-10-05 VITALS — BP 135/76 | HR 67 | Temp 98.1°F | Wt 193.8 lb

## 2015-10-05 DIAGNOSIS — F418 Other specified anxiety disorders: Secondary | ICD-10-CM | POA: Diagnosis not present

## 2015-10-05 DIAGNOSIS — M8580 Other specified disorders of bone density and structure, unspecified site: Secondary | ICD-10-CM | POA: Insufficient documentation

## 2015-10-05 DIAGNOSIS — S20219A Contusion of unspecified front wall of thorax, initial encounter: Secondary | ICD-10-CM | POA: Diagnosis not present

## 2015-10-05 DIAGNOSIS — R0789 Other chest pain: Secondary | ICD-10-CM

## 2015-10-05 DIAGNOSIS — S20213A Contusion of bilateral front wall of thorax, initial encounter: Secondary | ICD-10-CM

## 2015-10-05 DIAGNOSIS — M549 Dorsalgia, unspecified: Secondary | ICD-10-CM

## 2015-10-05 DIAGNOSIS — M546 Pain in thoracic spine: Secondary | ICD-10-CM

## 2015-10-05 DIAGNOSIS — S298XXA Other specified injuries of thorax, initial encounter: Secondary | ICD-10-CM

## 2015-10-05 DIAGNOSIS — M47894 Other spondylosis, thoracic region: Secondary | ICD-10-CM | POA: Diagnosis not present

## 2015-10-05 DIAGNOSIS — S299XXA Unspecified injury of thorax, initial encounter: Secondary | ICD-10-CM | POA: Diagnosis not present

## 2015-10-05 MED ORDER — LORAZEPAM 0.5 MG PO TABS: ORAL_TABLET | ORAL | Status: DC

## 2015-10-05 NOTE — Progress Notes (Signed)
Pre visit review using our clinic review tool, if applicable. No additional management support is needed unless otherwise documented below in the visit note.

## 2015-10-05 NOTE — Assessment & Plan Note (Signed)
Pt has not had xray yet She will get it today

## 2015-10-05 NOTE — Patient Instructions (Signed)
Fall Prevention in the Home  Falls can cause injuries and can affect people from all age groups. There are many simple things that you can do to make your home safe and to help prevent falls. WHAT CAN I DO ON THE OUTSIDE OF MY HOME?  Regularly repair the edges of walkways and driveways and fix any cracks.  Remove high doorway thresholds.  Trim any shrubbery on the main path into your home.  Use bright outdoor lighting.  Clear walkways of debris and clutter, including tools and rocks.  Regularly check that handrails are securely fastened and in good repair. Both sides of any steps should have handrails.  Install guardrails along the edges of any raised decks or porches.  Have leaves, snow, and ice cleared regularly.  Use sand or salt on walkways during winter months.  In the garage, clean up any spills right away, including grease or oil spills. WHAT CAN I DO IN THE BATHROOM?  Use night lights.  Install grab bars by the toilet and in the tub and shower. Do not use towel bars as grab bars.  Use non-skid mats or decals on the floor of the tub or shower.  If you need to sit down while you are in the shower, use a plastic, non-slip stool.Marland Kitchen  Keep the floor dry. Immediately clean up any water that spills on the floor.  Remove soap buildup in the tub or shower on a regular basis.  Attach bath mats securely with double-sided non-slip rug tape.  Remove throw rugs and other tripping hazards from the floor. WHAT CAN I DO IN THE BEDROOM?  Use night lights.  Make sure that a bedside light is easy to reach.  Do not use oversized bedding that drapes onto the floor.  Have a firm chair that has side arms to use for getting dressed.  Remove throw rugs and other tripping hazards from the floor. WHAT CAN I DO IN THE KITCHEN?   Clean up any spills right away.  Avoid walking on wet floors.  Place frequently used items in easy-to-reach places.  If you need to reach for something  above you, use a sturdy step stool that has a grab bar.  Keep electrical cables out of the way.  Do not use floor polish or wax that makes floors slippery. If you have to use wax, make sure that it is non-skid floor wax.  Remove throw rugs and other tripping hazards from the floor. WHAT CAN I DO IN THE STAIRWAYS?  Do not leave any items on the stairs.  Make sure that there are handrails on both sides of the stairs. Fix handrails that are broken or loose. Make sure that handrails are as long as the stairways.  Check any carpeting to make sure that it is firmly attached to the stairs. Fix any carpet that is loose or worn.  Avoid having throw rugs at the top or bottom of stairways, or secure the rugs with carpet tape to prevent them from moving.  Make sure that you have a light switch at the top of the stairs and the bottom of the stairs. If you do not have them, have them installed. WHAT ARE SOME OTHER FALL PREVENTION TIPS?  Wear closed-toe shoes that fit well and support your feet. Wear shoes that have rubber soles or low heels.  When you use a stepladder, make sure that it is completely opened and that the sides are firmly locked. Have someone hold the ladder while you  are using it. Do not climb a closed stepladder.  Add color or contrast paint or tape to grab bars and handrails in your home. Place contrasting color strips on the first and last steps.  Use mobility aids as needed, such as canes, walkers, scooters, and crutches.  Turn on lights if it is dark. Replace any light bulbs that burn out.  Set up furniture so that there are clear paths. Keep the furniture in the same spot.  Fix any uneven floor surfaces.  Choose a carpet design that does not hide the edge of steps of a stairway.  Be aware of any and all pets.  Review your medicines with your healthcare provider. Some medicines can cause dizziness or changes in blood pressure, which increase your risk of falling. Talk  with your health care provider about other ways that you can decrease your risk of falls. This may include working with a physical therapist or trainer to improve your strength, balance, and endurance.   This information is not intended to replace advice given to you by your health care provider. Make sure you discuss any questions you have with your health care provider.   Document Released: 11/10/2002 Document Revised: 04/06/2015 Document Reviewed: 12/25/2014 Elsevier Interactive Patient Education [employer].

## 2015-10-05 NOTE — Progress Notes (Signed)
Patient ID: Margaret Dyer, female    DOB: 1935-08-23  Age: 80 y.o. MRN: [id number]    Subjective:  Subjective HPI Margaret Dyer presents for f/u fall last Thursday.  She tripped on area rug and fell and hurt ribs on both sides and and hit her head.  No loc or MS changes.    Review of Systems  Constitutional: Negative for diaphoresis, appetite change, fatigue and unexpected weight change.  Eyes: Negative for pain, redness and visual disturbance.  Respiratory: Negative for cough, chest tightness, shortness of breath and wheezing.   Cardiovascular: Negative for chest pain, palpitations and leg swelling.  Endocrine: Negative for cold intolerance, heat intolerance, polydipsia, polyphagia and polyuria.  Genitourinary: Negative for dysuria, frequency and difficulty urinating.  Musculoskeletal: Positive for myalgias and back pain.  Neurological: Negative for dizziness, light-headedness, numbness and headaches.    History Past Medical History  Diagnosis Date  . Asthma   . Diabetes mellitus   . Osteopenia   . Renal cell carcinoma   . Goiter   . Hypertension   . Hyperlipidemia   . Blood transfusion     She has past surgical history that includes Nephrectomy (09.2000); Appendectomy; Abdominal hysterectomy; Knee surgery; and Hemorrhoid surgery.   Her family history includes Cancer in her father and sister; Coronary artery disease in an other family member; Diabetes in an other family member; Hyperlipidemia in an other family member; Hypertension in an other family member; Kidney cancer in her father.She reports that she has quit smoking. She has never used smokeless tobacco. She reports that she does not drink alcohol or use illicit drugs.  Current Outpatient Prescriptions on File Prior to Visit  Medication Sig Dispense Refill  . amLODipine (NORVASC) 10 MG tablet take 1 tablet by mouth once daily 30 tablet 5  . ezetimibe-simvastatin (VYTORIN) 10-20 MG per tablet Take 1 tablet by mouth at  bedtime.    . gabapentin (NEURONTIN) 100 MG capsule take 1 capsule by mouth at bedtime 30 capsule 2  . insulin glargine (LANTUS) 100 UNIT/ML injection Inject 55 Units into the skin every evening.     . insulin glulisine (APIDRA) 100 UNIT/ML injection 40 u sq in am and 80 u sq q pm 10 mL 11  . levothyroxine (SYNTHROID, LEVOTHROID) 25 MCG tablet Take 1 tablet (25 mcg total) by mouth daily. 30 tablet 0  . losartan (COZAAR) 100 MG tablet Take 1 tablet (100 mg total) by mouth daily. 30 tablet 5  . metoprolol tartrate (LOPRESSOR) 25 MG tablet Take 1 tablet (25 mg total) by mouth 2 (two) times daily. 180 tablet 0  . ondansetron (ZOFRAN) 4 MG tablet Take 1 tablet (4 mg total) by mouth every 8 (eight) hours as needed for nausea or vomiting. 15 tablet 1  . traMADol (ULTRAM) 50 MG tablet TAKE 2 TABLETS BY MOUTH TWICE A DAY AS NEEDED 120 tablet 0   No current facility-administered medications on file prior to visit.     Objective:  Objective Physical Exam  Constitutional: She is oriented to person, place, and time. She appears well-developed and well-nourished.  HENT:  Head: Normocephalic and atraumatic.  Eyes: Conjunctivae and EOM are normal.  Neck: Normal range of motion. Neck supple. No JVD present. Carotid bruit is not present. No thyromegaly present.  Cardiovascular: Normal rate, regular rhythm and normal heart sounds.   No murmur heard. Pulmonary/Chest: Effort normal and breath sounds normal. No respiratory distress. She has no wheezes. She has no rales. She exhibits no  tenderness.  Musculoskeletal: She exhibits no edema.       Right shoulder: She exhibits decreased range of motion and tenderness.       Right upper arm: She exhibits tenderness and swelling.       Arms: Neurological: She is alert and oriented to person, place, and time.  Psychiatric: She has a normal mood and affect.   BP 135/76 mmHg  Pulse 67  Temp(Src) 98.1 F (36.7 C) (Oral)  Wt 193 lb 12.8 oz (87.907 kg)  SpO2 95%   LMP  (LMP Unknown) Wt Readings from Last 3 Encounters:  10/05/15 193 lb 12.8 oz (87.907 kg)  09/23/15 196 lb (88.905 kg)  08/03/15 197 lb (89.359 kg)     Lab Results  Component Value Date   WBC 6.4 03/05/2015   HGB 16.0* 03/05/2015   HCT 45.8 03/05/2015   PLT 125 Platelet count consistent in citrate* 03/05/2015   GLUCOSE 444* 01/19/2015   CHOL 155 01/19/2015   TRIG 202.0* 01/19/2015   HDL 41.90 01/19/2015   LDLDIRECT 89.0 01/19/2015   LDLCALC 49 05/31/2012   ALT 35 01/19/2015   AST 26 01/19/2015   NA 134* 01/19/2015   K 4.8 01/19/2015   CL 98 01/19/2015   CREATININE 0.99 01/19/2015   BUN 19 01/19/2015   CO2 28 01/19/2015   TSH 0.559 05/08/2014   HGBA1C 9.5* 11/01/2011   MICROALBUR 74.9* 11/01/2011    No results found.   Assessment & Plan:  Plan I am having Margaret Dyer maintain her insulin glargine, ezetimibe-simvastatin, amLODipine, metoprolol tartrate, insulin glulisine, losartan, ondansetron, gabapentin, levothyroxine, traMADol, and LORazepam.  Meds ordered this encounter  Medications  . LORazepam (ATIVAN) 0.5 MG tablet    Sig: take 1 tablet by mouth twice a day if needed for anxiety    Dispense:  60 tablet    Refill:  0    Problem List Items Addressed This Visit    Bilateral contusion of ribs    Pt is requesting an xray to make sure ribs are not fractured Brace chest with pillow when you need to cough Pt has ultram for pain      Back pain    Pt has not had xray yet She will get it today       Other Visit Diagnoses    Other chest pain    -  Primary    Relevant Orders    DG Ribs Bilateral W/Chest    Other specified anxiety disorders        Relevant Medications    LORazepam (ATIVAN) 0.5 MG tablet       Follow-up: Return if symptoms worsen or fail to improve.  Garnet Koyanagi, DO

## 2015-10-05 NOTE — Assessment & Plan Note (Addendum)
Pt is requesting an xray to make sure ribs are not fractured Brace chest with pillow when you need to cough Pt has ultram for pain Offered home health for evaluation secondary to fall ---- pt refused-- she does not like strangers in her house

## 2015-10-06 ENCOUNTER — Telehealth: Payer: Self-pay

## 2015-10-06 NOTE — Telephone Encounter (Signed)
Patient called back regarding messages left on her answering machine. Results of XRs given. Would like to know if there is anything she can do about the possible rib fracture.

## 2015-10-06 NOTE — Progress Notes (Signed)
lmom 11/2

## 2015-10-07 ENCOUNTER — Telehealth: Payer: Self-pay

## 2015-10-07 NOTE — Telephone Encounter (Signed)
Patient advised.

## 2015-10-07 NOTE — Telephone Encounter (Signed)
Called patient with response from Dr. Etter Sjogren. No response. Answering machine did not come on,

## 2015-10-07 NOTE — Telephone Encounter (Signed)
No -- it just takes time to heal

## 2015-10-11 ENCOUNTER — Telehealth: Payer: Self-pay | Admitting: Family Medicine

## 2015-10-11 NOTE — Telephone Encounter (Signed)
Copy mailed.     KP

## 2015-10-11 NOTE — Telephone Encounter (Signed)
Pt called in requesting her x ray results be sent to her home. She says that they were on her ribs and her back   CB: [phone]

## 2015-11-01 ENCOUNTER — Telehealth: Payer: Self-pay | Admitting: Family Medicine

## 2015-11-01 ENCOUNTER — Other Ambulatory Visit: Payer: Self-pay | Admitting: Family Medicine

## 2015-11-01 MED ORDER — TRAMADOL HCL 50 MG PO TABS: ORAL_TABLET | ORAL | Status: DC

## 2015-11-01 NOTE — Telephone Encounter (Signed)
See rx. 

## 2015-11-01 NOTE — Telephone Encounter (Signed)
Patient calling back to check on the status of request below

## 2015-11-01 NOTE — Telephone Encounter (Signed)
Tramadol request: Pt is asking for Rx with #90 tabs instead of 120.  Spoke with pt. States she has been having to take 2 tabs twice daily due to rib injury / pain.  States she is unable to afford #120 and is requesting #90.  Please advise.  Last Rx:  09/29/15, #120 Next UDS: 12/2015.

## 2015-11-01 NOTE — Telephone Encounter (Signed)
Last OV 10/05/15 Tramadol last filled 09/20/15 #120 with 0  CSC, Moderate risk, UTD on UDS

## 2015-11-01 NOTE — Telephone Encounter (Signed)
Caller name: Self   Can be reached: [phone]  Pharmacy:  [id number] [place] [place] [phone](Phone) [phone](Fax)         Reason for call: Requesting refill on traMADol (ULTRAM) 50 MG tablet [[[id number]]Wants to get only 90 pills instead of 120.

## 2015-11-02 DIAGNOSIS — F4323 Adjustment disorder with mixed anxiety and depressed mood: Secondary | ICD-10-CM | POA: Diagnosis not present

## 2015-11-02 NOTE — Telephone Encounter (Signed)
Rx faxed to pharmacy.

## 2015-11-05 ENCOUNTER — Other Ambulatory Visit: Payer: Self-pay | Admitting: Family Medicine

## 2015-11-05 NOTE — Telephone Encounter (Signed)
Requesting Lorazepam 0.46m-Take 1 tablet by mouth twice a day if needed for anxiety. Last refill:10/05/15;#60,0 Last OV:10/05/15 UDS:09/23/15-Moderate risk-Next screen:12/24/15 Please advise.//AB/CMA

## 2015-11-08 NOTE — Telephone Encounter (Signed)
Faxed hardcopy for Lorazepam to [facility] on [address] [place]

## 2015-11-14 ENCOUNTER — Other Ambulatory Visit: Payer: Self-pay | Admitting: Family Medicine

## 2015-11-16 ENCOUNTER — Other Ambulatory Visit: Payer: Self-pay | Admitting: Family Medicine

## 2015-11-25 DIAGNOSIS — F419 Anxiety disorder, unspecified: Secondary | ICD-10-CM | POA: Diagnosis not present

## 2015-11-25 DIAGNOSIS — F329 Major depressive disorder, single episode, unspecified: Secondary | ICD-10-CM | POA: Diagnosis not present

## 2015-11-30 ENCOUNTER — Telehealth: Payer: Self-pay | Admitting: Family Medicine

## 2015-11-30 MED ORDER — TRAMADOL HCL 50 MG PO TABS: ORAL_TABLET | ORAL | Status: DC

## 2015-11-30 NOTE — Telephone Encounter (Signed)
Last seen 10/05/15 and filled 11/01/15 #90 written by Lenna Sciara. She normally gets #120 UDS 03/23/15 Neg for all med's   Please advise    KP

## 2015-11-30 NOTE — Telephone Encounter (Signed)
Faxed to [address].    KP

## 2015-11-30 NOTE — Telephone Encounter (Signed)
Ok to refill 120

## 2015-11-30 NOTE — Telephone Encounter (Signed)
Relation to GH:WEXH Call back [place]: [id number] [place] [address] Reason for call:  Patient requesting a refill of traMADol (ULTRAM) 50 MG tablet 120 tablets

## 2015-12-02 DIAGNOSIS — E118 Type 2 diabetes mellitus with unspecified complications: Secondary | ICD-10-CM | POA: Diagnosis not present

## 2015-12-02 DIAGNOSIS — E1149 Type 2 diabetes mellitus with other diabetic neurological complication: Secondary | ICD-10-CM | POA: Diagnosis not present

## 2015-12-02 DIAGNOSIS — E1165 Type 2 diabetes mellitus with hyperglycemia: Secondary | ICD-10-CM | POA: Diagnosis not present

## 2015-12-02 DIAGNOSIS — E039 Hypothyroidism, unspecified: Secondary | ICD-10-CM | POA: Diagnosis not present

## 2015-12-02 DIAGNOSIS — E559 Vitamin D deficiency, unspecified: Secondary | ICD-10-CM | POA: Diagnosis not present

## 2015-12-02 DIAGNOSIS — G629 Polyneuropathy, unspecified: Secondary | ICD-10-CM | POA: Diagnosis not present

## 2015-12-02 DIAGNOSIS — Z794 Long term (current) use of insulin: Secondary | ICD-10-CM | POA: Diagnosis not present

## 2015-12-02 DIAGNOSIS — E785 Hyperlipidemia, unspecified: Secondary | ICD-10-CM | POA: Diagnosis not present

## 2015-12-02 LAB — HEMOGLOBIN A1C: HEMOGLOBIN A1C: 11.3

## 2015-12-03 LAB — BASIC METABOLIC PANEL
BUN: 25 mg/dL — AB (ref 4–21)
CREATININE: 0.8 mg/dL (ref 0.5–1.1)
Glucose: 136 mg/dL
POTASSIUM: 4.6 mmol/L (ref 3.4–5.3)
SODIUM: 141 mmol/L (ref 137–147)

## 2015-12-03 LAB — HEPATIC FUNCTION PANEL
ALT: 47 U/L — AB (ref 7–35)
AST: 41 U/L — AB (ref 13–35)
Bilirubin, Total: 0.5 mg/dL

## 2015-12-03 LAB — TSH: TSH: 0.95 u[IU]/mL (ref 0.41–5.90)

## 2015-12-03 LAB — MICROALBUMIN, URINE
Microalb, Ur: 400
Microalb, Ur: >400

## 2015-12-03 LAB — LIPID PANEL: LDL CALC: 170 mg/dL

## 2015-12-07 ENCOUNTER — Other Ambulatory Visit: Payer: Self-pay | Admitting: Family Medicine

## 2015-12-07 NOTE — Telephone Encounter (Signed)
Last seen 10/05/15 and filled 11/05/15 #60  Please advise     KP

## 2015-12-15 ENCOUNTER — Encounter: Payer: Self-pay | Admitting: Family Medicine

## 2015-12-15 ENCOUNTER — Other Ambulatory Visit: Payer: Self-pay | Admitting: Family Medicine

## 2015-12-30 ENCOUNTER — Telehealth: Payer: Self-pay | Admitting: Family Medicine

## 2015-12-30 ENCOUNTER — Other Ambulatory Visit: Payer: Self-pay | Admitting: Family Medicine

## 2015-12-30 MED ORDER — TRAMADOL HCL 50 MG PO TABS: ORAL_TABLET | ORAL | Status: DC

## 2015-12-30 NOTE — Telephone Encounter (Signed)
Last seen 10/05/15 and filled 11/30/15 #120 UDS 03/23/15 Neg for all med's   Please advise KP

## 2015-12-30 NOTE — Telephone Encounter (Signed)
Patient aware Rx will be faxed to tomorrow.    KP

## 2015-12-30 NOTE — Telephone Encounter (Signed)
Ok to refill 

## 2015-12-30 NOTE — Telephone Encounter (Signed)
Pharmacy: [id number] [place] [place] Reason for call: Pt needs refill on tramadol. States she only has a couple left.

## 2015-12-30 NOTE — Telephone Encounter (Signed)
Pt called to check on RX stating now that she does not have any for tonight. I advised her at 5:00pm that it would likely be tomorrow. Pt requesting tonight if at all possible.

## 2016-01-03 ENCOUNTER — Other Ambulatory Visit: Payer: Self-pay | Admitting: Family Medicine

## 2016-01-03 NOTE — Telephone Encounter (Signed)
Last seen 10/05/15 and filled 12/07/15 #60   Please advise     KP

## 2016-01-31 ENCOUNTER — Other Ambulatory Visit: Payer: Self-pay | Admitting: Family Medicine

## 2016-01-31 ENCOUNTER — Telehealth: Payer: Self-pay | Admitting: Family Medicine

## 2016-01-31 NOTE — Telephone Encounter (Signed)
Duplicate request.    KP

## 2016-01-31 NOTE — Telephone Encounter (Signed)
Faxed.   KP

## 2016-01-31 NOTE — Telephone Encounter (Signed)
Relationship to patient: Self   Can be reached:[phone]  Ph[facility]acy: [facility]  Reason for call: Refill request for [facility]   Pt says that she is still having side pain. LVM if no answer.

## 2016-01-31 NOTE — Telephone Encounter (Signed)
Last seen 10/05/15 and filled 12/30/15 #120 UDS 09/23/15 moderate risk  Please advise    KP

## 2016-02-07 ENCOUNTER — Other Ambulatory Visit: Payer: Self-pay | Admitting: Family Medicine

## 2016-02-08 ENCOUNTER — Telehealth: Payer: Self-pay | Admitting: Family Medicine

## 2016-02-08 NOTE — Telephone Encounter (Signed)
Last seen 10/05/15 and filled 01/03/16 #60   Please advise    KP

## 2016-02-08 NOTE — Telephone Encounter (Signed)
error:[id number] ° °

## 2016-02-29 ENCOUNTER — Other Ambulatory Visit: Payer: Self-pay | Admitting: Family

## 2016-02-29 ENCOUNTER — Encounter: Payer: Self-pay | Admitting: Family Medicine

## 2016-02-29 ENCOUNTER — Ambulatory Visit (INDEPENDENT_AMBULATORY_CARE_PROVIDER_SITE_OTHER): Payer: Medicare Other | Admitting: Family Medicine

## 2016-02-29 VITALS — BP 125/77 | HR 79 | Temp 98.5°F | Wt 196.4 lb

## 2016-02-29 DIAGNOSIS — G5603 Carpal tunnel syndrome, bilateral upper limbs: Secondary | ICD-10-CM

## 2016-02-29 DIAGNOSIS — R110 Nausea: Secondary | ICD-10-CM

## 2016-02-29 DIAGNOSIS — G894 Chronic pain syndrome: Secondary | ICD-10-CM

## 2016-02-29 DIAGNOSIS — F411 Generalized anxiety disorder: Secondary | ICD-10-CM

## 2016-02-29 MED ORDER — GABAPENTIN 100 MG PO CAPS: 100.0000 mg | ORAL_CAPSULE | Freq: Two times a day (BID) | ORAL | Status: DC

## 2016-02-29 MED ORDER — TRAMADOL HCL 50 MG PO TABS: ORAL_TABLET | ORAL | Status: DC

## 2016-02-29 MED ORDER — LORAZEPAM 0.5 MG PO TABS: ORAL_TABLET | ORAL | Status: DC

## 2016-02-29 MED ORDER — ONDANSETRON HCL 4 MG PO TABS: 4.0000 mg | ORAL_TABLET | Freq: Three times a day (TID) | ORAL | Status: DC | PRN

## 2016-02-29 NOTE — Progress Notes (Signed)
Patient ID: Margaret Dyer, female    DOB: 1935/04/04  Age: 81 y.o. MRN: [id number]    Subjective:  Subjective HPI Margaret Dyer presents for pain, numbness tingling R hand/ wrist , esp 1-4 fingers.   She is dropping things  Review of Systems  Constitutional: Negative for diaphoresis, appetite change, fatigue and unexpected weight change.  Eyes: Negative for pain, redness and visual disturbance.  Respiratory: Negative for cough, chest tightness, shortness of breath and wheezing.   Cardiovascular: Negative for chest pain, palpitations and leg swelling.  Endocrine: Negative for cold intolerance, heat intolerance, polydipsia, polyphagia and polyuria.  Genitourinary: Negative for dysuria, frequency and difficulty urinating.  Neurological: Positive for weakness and numbness. Negative for dizziness, light-headedness and headaches.    History Past Medical History  Diagnosis Date  . Asthma   . Diabetes mellitus   . Osteopenia   . Renal cell carcinoma   . Goiter   . Hypertension   . Hyperlipidemia   . Blood transfusion     She has past surgical history that includes Nephrectomy (09.2000); Appendectomy; Abdominal hysterectomy; Knee surgery; and Hemorrhoid surgery.   Her family history includes Cancer in her father and sister; Kidney cancer in her father.She reports that she has quit smoking. She has never used smokeless tobacco. She reports that she does not drink alcohol or use illicit drugs.  Current Outpatient Prescriptions on File Prior to Visit  Medication Sig Dispense Refill  . amLODipine (NORVASC) 10 MG tablet take 1 tablet by mouth once daily 30 tablet 5  . ezetimibe-simvastatin (VYTORIN) 10-20 MG per tablet Take 1 tablet by mouth at bedtime.    . insulin glargine (LANTUS) 100 UNIT/ML injection Inject 55 Units into the skin every evening.     . insulin glulisine (APIDRA) 100 UNIT/ML injection 40 u sq in am and 80 u sq q pm 10 mL 11  . levothyroxine (SYNTHROID, LEVOTHROID) 25 MCG  tablet Take 1 tablet (25 mcg total) by mouth daily. (Patient taking differently: Take 12.5 mcg by mouth daily. ) 30 tablet 0  . losartan (COZAAR) 100 MG tablet take 1 tablet by mouth once daily 30 tablet 5   No current facility-administered medications on file prior to visit.     Objective:  Objective Physical Exam  Constitutional: She is oriented to person, place, and time. She appears well-developed and well-nourished.  HENT:  Head: Normocephalic and atraumatic.  Eyes: Conjunctivae and EOM are normal.  Neck: Normal range of motion. Neck supple. No JVD present. Carotid bruit is not present. No thyromegaly present.  Cardiovascular: Normal rate, regular rhythm and normal heart sounds.   No murmur heard. Pulmonary/Chest: Effort normal and breath sounds normal. No respiratory distress. She has no wheezes. She has no rales. She exhibits no tenderness.  Musculoskeletal: She exhibits no edema.  Neurological: She is alert and oriented to person, place, and time.  Numbness / tingling R 1-4 fingers  Psychiatric: She has a normal mood and affect.  Nursing note and vitals reviewed.  BP 125/77 mmHg  Pulse 79  Temp(Src) 98.5 F (36.9 C) (Oral)  Wt 196 lb 6.4 oz (89.086 kg)  SpO2 95%  LMP  (LMP Unknown) Wt Readings from Last 3 Encounters:  02/29/16 196 lb 6.4 oz (89.086 kg)  10/05/15 193 lb 12.8 oz (87.907 kg)  09/23/15 196 lb (88.905 kg)     Lab Results  Component Value Date   WBC 6.4 03/05/2015   HGB 16.0* 03/05/2015   HCT 45.8 03/05/2015  PLT 125 Platelet count consistent in citrate* 03/05/2015   GLUCOSE 444* 01/19/2015   CHOL 155 01/19/2015   TRIG 202.0* 01/19/2015   HDL 41.90 01/19/2015   LDLDIRECT 89.0 01/19/2015   LDLCALC 170 12/03/2015   ALT 47* 12/03/2015   AST 41* 12/03/2015   NA 141 12/03/2015   K 4.6 12/03/2015   CL 98 01/19/2015   CREATININE 0.8 12/03/2015   BUN 25* 12/03/2015   CO2 28 01/19/2015   TSH 0.95 12/03/2015   HGBA1C 11.3 12/02/2015   MICROALBUR  400 12/03/2015   MICROALBUR >400 12/03/2015    Dg Ribs Bilateral W/chest  10/05/2015  CLINICAL DATA:  Fall.  Injury. EXAM: BILATERAL RIBS AND CHEST - 4+ VIEW COMPARISON:  03/02/2014. FINDINGS: Mediastinum and hilar structures normal. Cardiomegaly. No pulmonary venous congestion. No focal infiltrate. No pleural effusion or pneumothorax. Diffuse osteopenia. No pneumothorax. Surgical clips upper abdomen. Nondisplaced left lower posterior lateral rib fractures cannot be excluded. IMPRESSION: Nondisplaced left lower posterior lateral rib fractures cannot be excluded. No pneumothorax. Electronically Signed   By: Margaret Dyer  Register   On: 10/05/2015 17:10   Dg Thoracic Spine 2 View  10/05/2015  CLINICAL DATA:  Fall.  Injury. EXAM: THORACIC SPINE 2 VIEWS COMPARISON:  10/05/2015.  Bone scan 06/24/2012. FINDINGS: Paraspinal soft tissues are normal. Diffuse degenerative change thoracic spine. No acute bony abnormalities identified. Surgical clips noted in the upper abdomen. IMPRESSION: Diffuse osteopenia and degenerative change thoracic spine. No acute bony abnormality identified. Electronically Signed   By: Margaret Dyer  Register   On: 10/05/2015 17:05     Assessment & Plan:  Plan I have changed Margaret Dyer's gabapentin. I am also having her maintain her insulin glargine, ezetimibe-simvastatin, insulin glulisine, levothyroxine, amLODipine, losartan, metoprolol tartrate, Vitamin D (Ergocalciferol), traMADol, LORazepam, and ondansetron.  Meds ordered this encounter  Medications  . Vitamin D, Ergocalciferol, (DRISDOL) 50000 units CAPS capsule    Sig: Take 1 capsule by mouth once a week.    Refill:  0  . traMADol (ULTRAM) 50 MG tablet    Sig: TAKE 2 TABLETS BY MOUTH TWICE A DAY AS NEEDED    Dispense:  120 tablet    Refill:  0    Not to exceed 5 additional fills before 06/28/2016.  Marland Kitchen LORazepam (ATIVAN) 0.5 MG tablet    Sig: take 1 tablet by mouth twice a day if needed    Dispense:  60 tablet    Refill:  0    Do  not fill until 03/09/2016  . ondansetron (ZOFRAN) 4 MG tablet    Sig: Take 1 tablet (4 mg total) by mouth every 8 (eight) hours as needed for nausea or vomiting.    Dispense:  15 tablet    Refill:  1  . gabapentin (NEURONTIN) 100 MG capsule    Sig: Take 1 capsule (100 mg total) by mouth 2 (two) times daily.    Dispense:  60 capsule    Refill:  5    Problem List Items Addressed This Visit    None    Visit Diagnoses    Nausea without vomiting    -  Primary    Relevant Medications    ondansetron (ZOFRAN) 4 MG tablet    Chronic pain syndrome        Relevant Medications    traMADol (ULTRAM) 50 MG tablet    gabapentin (NEURONTIN) 100 MG capsule    Generalized anxiety disorder        Relevant Medications    LORazepam (ATIVAN)  0.5 MG tablet       Follow-up: Return in about 6 months (around 08/31/2016), or if symptoms worsen or fail to improve.  Ann Held, DO

## 2016-02-29 NOTE — Patient Instructions (Signed)

## 2016-02-29 NOTE — Progress Notes (Signed)
Pre visit review using our clinic review tool, if applicable. No additional management support is needed unless otherwise documented below in the visit note.

## 2016-03-04 DIAGNOSIS — G5600 Carpal tunnel syndrome, unspecified upper limb: Secondary | ICD-10-CM | POA: Insufficient documentation

## 2016-03-04 NOTE — Assessment & Plan Note (Signed)
Pt did not want a referral  She said she had not been good with wearing her splint regularly She will wear her splints regularly and let us know if she would like something else done

## 2016-03-06 ENCOUNTER — Other Ambulatory Visit: Payer: Self-pay | Admitting: Family Medicine

## 2016-03-06 NOTE — Telephone Encounter (Signed)
Last filled 02/29/16.    KP

## 2016-03-23 ENCOUNTER — Ambulatory Visit: Payer: Medicare Other | Admitting: Family Medicine

## 2016-03-23 ENCOUNTER — Telehealth: Payer: Self-pay | Admitting: Family Medicine

## 2016-03-24 NOTE — Telephone Encounter (Signed)
Pt was no show 03/23/16 4:15pm for follow up, 1st no show, charge or no charge?

## 2016-03-24 NOTE — Telephone Encounter (Signed)
Please call pt

## 2016-03-27 ENCOUNTER — Encounter: Payer: Self-pay | Admitting: Family Medicine

## 2016-03-27 NOTE — Telephone Encounter (Signed)
Ok to not charge

## 2016-03-27 NOTE — Telephone Encounter (Signed)
charge

## 2016-03-27 NOTE — Telephone Encounter (Signed)
Marked to charge and mailing no show letter

## 2016-03-27 NOTE — Telephone Encounter (Signed)
Patient states she never received a voicemail therefore she was unaware of appointment and would like no show fee waived.

## 2016-03-27 NOTE — Telephone Encounter (Signed)
LM for pt to call in to reschedule appt, charge or no charge?

## 2016-03-28 NOTE — Telephone Encounter (Signed)
Discarded letter

## 2016-04-09 ENCOUNTER — Emergency Department (HOSPITAL_BASED_OUTPATIENT_CLINIC_OR_DEPARTMENT_OTHER): Payer: Medicare Other

## 2016-04-09 ENCOUNTER — Emergency Department (HOSPITAL_BASED_OUTPATIENT_CLINIC_OR_DEPARTMENT_OTHER)
Admission: EM | Admit: 2016-04-09 | Discharge: 2016-04-09 | Disposition: A | Discharge status: Home / Self Care | Payer: Medicare Other | Attending: Emergency Medicine | Admitting: Emergency Medicine

## 2016-04-09 ENCOUNTER — Encounter (HOSPITAL_BASED_OUTPATIENT_CLINIC_OR_DEPARTMENT_OTHER): Payer: Self-pay | Admitting: *Deleted

## 2016-04-09 DIAGNOSIS — Z79899 Other long term (current) drug therapy: Secondary | ICD-10-CM | POA: Diagnosis not present

## 2016-04-09 DIAGNOSIS — Z87891 Personal history of nicotine dependence: Secondary | ICD-10-CM | POA: Diagnosis not present

## 2016-04-09 DIAGNOSIS — E119 Type 2 diabetes mellitus without complications: Secondary | ICD-10-CM | POA: Insufficient documentation

## 2016-04-09 DIAGNOSIS — I10 Essential (primary) hypertension: Secondary | ICD-10-CM | POA: Diagnosis not present

## 2016-04-09 DIAGNOSIS — L03115 Cellulitis of right lower limb: Secondary | ICD-10-CM

## 2016-04-09 DIAGNOSIS — J45909 Unspecified asthma, uncomplicated: Secondary | ICD-10-CM | POA: Diagnosis not present

## 2016-04-09 DIAGNOSIS — Z794 Long term (current) use of insulin: Secondary | ICD-10-CM | POA: Insufficient documentation

## 2016-04-09 DIAGNOSIS — E785 Hyperlipidemia, unspecified: Secondary | ICD-10-CM | POA: Insufficient documentation

## 2016-04-09 DIAGNOSIS — M79604 Pain in right leg: Secondary | ICD-10-CM | POA: Diagnosis present

## 2016-04-09 DIAGNOSIS — M79661 Pain in right lower leg: Secondary | ICD-10-CM | POA: Diagnosis not present

## 2016-04-09 MED ORDER — DOXYCYCLINE HYCLATE 100 MG PO CAPS: 100.0000 mg | ORAL_CAPSULE | Freq: Two times a day (BID) | ORAL | Status: DC

## 2016-04-09 NOTE — ED Provider Notes (Signed)
CSN: [id number]     Arrival date & time 04/09/16  1450 History   First MD Initiated Contact with Patient 04/09/16 1555     Chief Complaint  Patient presents with  . Leg Pain   HPI  Margaret Dyer is a 81 year old female with PMHx including DM presenting with leg wound. Pt believes she was bitten by an insect approximately 1.5 weeks ago. Since then, there has been a patch of redness over her posterior calf that is tender to palpation. She states that this area has slowly be enlarging. She reports a small wound at the center of the erythema that developed a black scab. Denies purulent drainage from the wound. She denies swelling or warmth of the calf. She denies difficulty ambulating. Denies systemic symptoms including fevers, chills, AMS, nausea or vomiting. She has not tried any OTC medications for this. Denies numbness, tingling, weakness or loss of sensation in the right foot. Denies history of diabetic neuropathy. She has no other complaints today.   Past Medical History  Diagnosis Date  . Asthma   . Diabetes mellitus   . Osteopenia   . Renal cell carcinoma   . Goiter   . Hypertension   . Hyperlipidemia   . Blood transfusion    Past Surgical History  Procedure Laterality Date  . Nephrectomy  09.2000  . Appendectomy    . Abdominal hysterectomy    . Knee surgery    . Hemorrhoid surgery     Family History  Problem Relation Age of Onset  . Cancer Sister     bladder  . Kidney cancer Father   . Cancer Father     renal  . Coronary artery disease    . Diabetes    . Hyperlipidemia    . Hypertension     Social History  Substance Use Topics  . Smoking status: Former Research scientist (life sciences)  . Smokeless tobacco: Never Used     Comment: never used tobacco  . Alcohol Use: No   OB History    No data available     Review of Systems  All other systems reviewed and are negative.     Allergies  Cefuroxime axetil and Ciprofloxacin  Home Medications   Prior to Admission medications    Medication Sig Start Date End Date Taking? Authorizing Provider  amLODipine (NORVASC) 10 MG tablet take 1 tablet by mouth once daily 11/15/15   Rosalita Chessman Chase, DO  doxycycline (VIBRAMYCIN) 100 MG capsule Take 1 capsule (100 mg total) by mouth 2 (two) times daily. 04/09/16   Achillies Buehl, PA-C  ezetimibe-simvastatin (VYTORIN) 10-20 MG per tablet Take 1 tablet by mouth at bedtime.    Historical Provider, MD  gabapentin (NEURONTIN) 100 MG capsule Take 1 capsule (100 mg total) by mouth 2 (two) times daily. 02/29/16   Rosalita Chessman Chase, DO  insulin glargine (LANTUS) 100 UNIT/ML injection Inject 55 Units into the skin every evening.     Historical Provider, MD  insulin glulisine (APIDRA) 100 UNIT/ML injection 40 u sq in am and 80 u sq q pm 02/19/15   Yvonne R Lowne Chase, DO  levothyroxine (SYNTHROID, LEVOTHROID) 25 MCG tablet Take 1 tablet (25 mcg total) by mouth daily. Patient taking differently: Take 12.5 mcg by mouth daily.  09/23/15   Rosalita Chessman Chase, DO  LORazepam (ATIVAN) 0.5 MG tablet take 1 tablet by mouth twice a day if needed 02/29/16   Rosalita Chessman Chase, DO  losartan (COZAAR) 100  MG tablet take 1 tablet by mouth once daily 11/16/15   Rosalita Chessman Chase, DO  metoprolol tartrate (LOPRESSOR) 25 MG tablet take 1 tablet by mouth twice a day 02/29/16   Rosalita Chessman Chase, DO  ondansetron (ZOFRAN) 4 MG tablet Take 1 tablet (4 mg total) by mouth every 8 (eight) hours as needed for nausea or vomiting. 02/29/16   Rosalita Chessman Chase, DO  traMADol (ULTRAM) 50 MG tablet TAKE 2 TABLETS BY MOUTH TWICE A DAY AS NEEDED 02/29/16   Rosalita Chessman Chase, DO  Vitamin D, Ergocalciferol, (DRISDOL) 50000 units CAPS capsule Take 1 capsule by mouth once a week. 01/22/16   Historical Provider, MD   BP 152/78 mmHg  Pulse 76  Temp(Src) 98.1 F (36.7 C) (Oral)  Resp 18  Ht _0  (1.6 m)  Wt 89.359 kg  BMI 34.91 kg/m2  SpO2 98%  LMP  (LMP Unknown) Physical Exam  Constitutional: She appears  well-developed and well-nourished. No distress.  Nontoxic appearing  HENT:  Head: Normocephalic and atraumatic.  Right Ear: External ear normal.  Left Ear: External ear normal.  Eyes: Conjunctivae are normal. Right eye exhibits no discharge. Left eye exhibits no discharge. No scleral icterus.  Neck: Normal range of motion.  Cardiovascular: Normal rate and intact distal pulses.   Pedal pulses palpable. Cap refill < 3 seconds  Pulmonary/Chest: Effort normal.  Musculoskeletal: Normal range of motion.  Erythematous patch of approximately 4 cm noted to posterior right calf. No streaking. Small central eschar noted without purulence, fluctuance or induration. No warmth of the area. No unilateral calf swelling. Area is mildly TTP. No open wounds. FROM of knee, ankle and toes intact. Pt ambulatory  Neurological: She is alert. Coordination normal.  5/5 strength of BLE. Sensation to light touch intact throughout  Skin: Skin is warm and dry.  Psychiatric: She has a normal mood and affect. Her behavior is normal.  Nursing note and vitals reviewed.   ED Course  Procedures (including critical care time) Labs Review Labs Reviewed - No data to display  Imaging Review Dg Tibia/fibula Right  04/09/2016  CLINICAL DATA:  Painful black sore at posterior midshaft of tibia and fibular for 1.5 weeks pain EXAM: RIGHT TIBIA AND FIBULA - 2 VIEW COMPARISON:  None FINDINGS: Diffuse osseous demineralization. Joint space narrowing spur formation at medial compartment RIGHT knee. Chondrocalcinosis RIGHT knee and RIGHT ankle question CPPD. No acute fracture, dislocation, or bone destruction. Mild tendinous calcification at distal Achilles tendon. No definite focal soft tissue abnormalities. IMPRESSION: Osseous demineralization with degenerative changes RIGHT knee and question CPPD. No acute abnormalities. Electronically Signed   By: Lavonia Dana M.D.   On: 04/09/2016 17:14   I have personally reviewed and evaluated these  images and lab results as part of my medical decision-making.   EKG Interpretation None      MDM   Final diagnoses:  Cellulitis of right lower extremity   Pt presenting with skin rash consistent with cellulitis. Localized area of erythema without streaking, papules, vesicles or desquamation. Small central eschar. Pt is without gross abscess for which I&D would be possible.  Area marked and pt encouraged to return if redness begins to streak, extends beyond the markings, fever or nausea/vomiting develop.  Pt is alert, oriented, NAD, afebrile, non tachycardic, nonseptic and nontoxic appearing. Pt to be d/c on oral antibiotics with strict f/u instructions with PCP in 2-3 days for wound recheck.       Josephina Gip, PA-C  04/09/16 Cherry Log, MD 04/09/16 [phone]

## 2016-04-09 NOTE — Discharge Instructions (Signed)
Cellulitis Cellulitis is an infection of the skin and the tissue beneath it. The infected area is usually red and tender. Cellulitis occurs most often in the arms and lower legs.  CAUSES  Cellulitis is caused by bacteria that enter the skin through cracks or cuts in the skin. The most common types of bacteria that cause cellulitis are staphylococci and streptococci. SIGNS AND SYMPTOMS   Redness and warmth.  Swelling.  Tenderness or pain.  Fever. DIAGNOSIS  Your health care provider can usually determine what is wrong based on a physical exam. Blood tests may also be done. TREATMENT  Treatment usually involves taking an antibiotic medicine. HOME CARE INSTRUCTIONS   Take your antibiotic medicine as directed by your health care provider. Finish the antibiotic even if you start to feel better.  Keep the infected arm or leg elevated to reduce swelling.  Apply a warm cloth to the affected area up to 4 times per day to relieve pain.  Take medicines only as directed by your health care provider.  Keep all follow-up visits as directed by your health care provider. SEEK MEDICAL CARE IF:   You notice red streaks coming from the infected area.  Your red area gets larger or turns dark in color.  Your bone or joint underneath the infected area becomes painful after the skin has healed.  Your infection returns in the same area or another area.  You notice a swollen bump in the infected area.  You develop new symptoms.  You have a fever. SEEK IMMEDIATE MEDICAL CARE IF:   You feel very sleepy.  You develop vomiting or diarrhea.  You have a general ill feeling (malaise) with muscle aches and pains.   This information is not intended to replace advice given to you by your health care provider. Make sure you discuss any questions you have with your health care provider.   Document Released: 08/30/2005 Document Revised: 08/11/2015 Document Reviewed: 02/05/2012 Elsevier Interactive  Patient Education [employer].

## 2016-04-09 NOTE — ED Notes (Addendum)
Pt c/o wound to right calf x 2 weeks. Pt reports out of tramadol.

## 2016-04-09 NOTE — ED Notes (Signed)
Pt reports reddened area to posterior right calf about 1.5 weeks ago.  Since then, area has become more painful and now has a hardened black scab in center of reddened area sensitive to touch, approx 2in diameter.

## 2016-04-11 ENCOUNTER — Telehealth: Payer: Self-pay | Admitting: Family Medicine

## 2016-04-11 NOTE — Telephone Encounter (Signed)
Last OV: 02/29/16 Last filled: 02/29/16, #60, 0 RF  Sig: take 1 tablet by mouth twice a day if needed UDS: 10/01/15, negative for ativan, moderate risk

## 2016-04-12 NOTE — Telephone Encounter (Signed)
Pt called in stating she is out of ativan. She is hoping to be sent in today. Advised doc of day may sign but may defer to Dr. Carollee Herter tomorrow when she is in office.

## 2016-04-12 NOTE — Telephone Encounter (Signed)
Rx phoned to pharmacy as PCP is out of the office today.

## 2016-04-20 ENCOUNTER — Ambulatory Visit (INDEPENDENT_AMBULATORY_CARE_PROVIDER_SITE_OTHER): Payer: Medicare Other | Admitting: Family Medicine

## 2016-04-20 ENCOUNTER — Telehealth: Payer: Self-pay | Admitting: Family Medicine

## 2016-04-20 ENCOUNTER — Encounter: Payer: Self-pay | Admitting: Family Medicine

## 2016-04-20 VITALS — BP 152/82 | HR 81 | Temp 98.2°F | Ht 63.0 in | Wt 199.6 lb

## 2016-04-20 DIAGNOSIS — R110 Nausea: Secondary | ICD-10-CM

## 2016-04-20 DIAGNOSIS — G5601 Carpal tunnel syndrome, right upper limb: Secondary | ICD-10-CM

## 2016-04-20 DIAGNOSIS — Z85528 Personal history of other malignant neoplasm of kidney: Secondary | ICD-10-CM

## 2016-04-20 DIAGNOSIS — G894 Chronic pain syndrome: Secondary | ICD-10-CM

## 2016-04-20 DIAGNOSIS — L03115 Cellulitis of right lower limb: Secondary | ICD-10-CM | POA: Diagnosis not present

## 2016-04-20 DIAGNOSIS — R32 Unspecified urinary incontinence: Secondary | ICD-10-CM

## 2016-04-20 MED ORDER — DOXYCYCLINE HYCLATE 100 MG PO CAPS: 100.0000 mg | ORAL_CAPSULE | Freq: Two times a day (BID) | ORAL | Status: DC

## 2016-04-20 MED ORDER — GABAPENTIN 100 MG PO CAPS: 100.0000 mg | ORAL_CAPSULE | Freq: Two times a day (BID) | ORAL | Status: DC

## 2016-04-20 MED ORDER — MIRABEGRON ER 25 MG PO TB24: 25.0000 mg | ORAL_TABLET | Freq: Every day | ORAL | Status: DC

## 2016-04-20 MED ORDER — ONDANSETRON HCL 4 MG PO TABS: 4.0000 mg | ORAL_TABLET | Freq: Three times a day (TID) | ORAL | Status: DC | PRN

## 2016-04-20 MED ORDER — TRAMADOL HCL 50 MG PO TABS: ORAL_TABLET | ORAL | Status: DC

## 2016-04-20 NOTE — Telephone Encounter (Signed)
im not as comfortable rx the others.     We are referring her to urology and will let them help decide.

## 2016-04-20 NOTE — Progress Notes (Signed)
Pre visit review using our clinic review tool, if applicable. No additional management support is needed unless otherwise documented below in the visit note. 

## 2016-04-20 NOTE — Telephone Encounter (Signed)
Caller name: Colletta Maryland with  [facility] Can be reached: [phone]   Reason for call: Myrbetriq is over $300. Pt does not have insurance and only uses discount cards.

## 2016-04-20 NOTE — Patient Instructions (Signed)
Carpal Tunnel Syndrome Carpal tunnel syndrome is a condition that causes pain in your hand and arm. The carpal tunnel is a narrow area located on the palm side of your wrist. Repeated wrist motion or certain diseases may cause swelling within the tunnel. This swelling pinches the main nerve in the wrist (median nerve). CAUSES  This condition may be caused by:   Repeated wrist motions.  Wrist injuries.  Arthritis.  A cyst or tumor in the carpal tunnel.  Fluid buildup during pregnancy. Sometimes the cause of this condition is not known.  RISK FACTORS This condition is more likely to develop in:   People who have jobs that cause them to repeatedly move their wrists in the same motion, such as butchers and cashiers.  Women.  People with certain conditions, such as:  Diabetes.  Obesity.  An underactive thyroid (hypothyroidism).  Kidney failure. SYMPTOMS  Symptoms of this condition include:   A tingling feeling in your fingers, especially in your thumb, index, and middle fingers.  Tingling or numbness in your hand.  An aching feeling in your entire arm, especially when your wrist and elbow are bent for long periods of time.  Wrist pain that goes up your arm to your shoulder.  Pain that goes down into your palm or fingers.  A weak feeling in your hands. You may have trouble grabbing and holding items. Your symptoms may feel worse during the night.  DIAGNOSIS  This condition is diagnosed with a medical history and physical exam. You may also have tests, including:   An electromyogram (EMG). This test measures electrical signals sent by your nerves into the muscles.  X-rays. TREATMENT  Treatment for this condition includes:  Lifestyle changes. It is important to stop doing or modify the activity that caused your condition.  Physical or occupational therapy.  Medicines for pain and inflammation. This may include medicine that is injected into your wrist.  A wrist  splint.  Surgery. HOME CARE INSTRUCTIONS  If You Have a Splint:  Wear it as told by your health care provider. Remove it only as told by your health care provider.  Loosen the splint if your fingers become numb and tingle, or if they turn cold and blue.  Keep the splint clean and dry. General Instructions  Take over-the-counter and prescription medicines only as told by your health care provider.  Rest your wrist from any activity that may be causing your pain. If your condition is work related, talk to your employer about changes that can be made, such as getting a wrist pad to use while typing.  If directed, apply ice to the painful area:  Put ice in a plastic bag.  Place a towel between your skin and the bag.  Leave the ice on for 20 minutes, 2-3 times per day.  Keep all follow-up visits as told by your health care provider. This is important.  Do any exercises as told by your health care provider, physical therapist, or occupational therapist. [place] IF:   You have new symptoms.  Your pain is not controlled with medicines.  Your symptoms get worse.   This information is not intended to replace advice given to you by your health care provider. Make sure you discuss any questions you have with your health care provider.   Document Released: 11/17/2000 Document Revised: 08/11/2015 Document Reviewed: 04/07/2015 Elsevier Interactive Patient Education [employer].

## 2016-04-21 NOTE — Telephone Encounter (Signed)
Called pt to notify her of below. She states she has seen Dr. Serita Butcher w/ [facility] in the past, but he retired. Her sister is seeing someone else w/ [facility], and she would like to see that provider if possible. She will call back today or Monday w/ name of preferred provider.

## 2016-04-22 NOTE — Progress Notes (Signed)
Patient ID: Margaret Dyer, female    DOB: March 08, 1935  Age: 81 y.o. MRN: [id number]    Subjective:  Subjective HPI Margaret Dyer presents for worsening carpal tunnel syndrome. And pain meds refill.  She also had a hx of renal cell carcinoma and was supposed to f/u with nephrology but has not-- she is requesting a referral for f/u She is also being treated for cellulitis r leg---- she is on doxycyciline and it is improving but i is not completely resolved.    Review of Systems  Constitutional: Negative for diaphoresis, appetite change, fatigue and unexpected weight change.  Eyes: Negative for pain, redness and visual disturbance.  Respiratory: Negative for cough, chest tightness, shortness of breath and wheezing.   Cardiovascular: Negative for chest pain, palpitations and leg swelling.  Endocrine: Negative for cold intolerance, heat intolerance, polydipsia, polyphagia and polyuria.  Genitourinary: Negative for dysuria, frequency and difficulty urinating.  Neurological: Negative for dizziness, light-headedness, numbness and headaches.    History Past Medical History  Diagnosis Date  . Asthma   . Diabetes mellitus   . Osteopenia   . Renal cell carcinoma   . Goiter   . Hypertension   . Hyperlipidemia   . Blood transfusion     She has past surgical history that includes Nephrectomy (09.2000); Appendectomy; Abdominal hysterectomy; Knee surgery; and Hemorrhoid surgery.   Her family history includes Cancer in her father and sister; Kidney cancer in her father.She reports that she has quit smoking. She has never used smokeless tobacco. She reports that she does not drink alcohol or use illicit drugs.  Current Outpatient Prescriptions on File Prior to Visit  Medication Sig Dispense Refill  . amLODipine (NORVASC) 10 MG tablet take 1 tablet by mouth once daily 30 tablet 5  . ezetimibe-simvastatin (VYTORIN) 10-20 MG per tablet Take 1 tablet by mouth at bedtime.    . insulin glargine (LANTUS)  100 UNIT/ML injection Inject 55 Units into the skin every evening.     . insulin glulisine (APIDRA) 100 UNIT/ML injection 40 u sq in am and 80 u sq q pm 10 mL 11  . levothyroxine (SYNTHROID, LEVOTHROID) 25 MCG tablet Take 1 tablet (25 mcg total) by mouth daily. (Patient taking differently: Take 12.5 mcg by mouth daily. ) 30 tablet 0  . LORazepam (ATIVAN) 0.5 MG tablet take 1 tablet by mouth twice a day if needed 60 tablet 0  . losartan (COZAAR) 100 MG tablet take 1 tablet by mouth once daily 30 tablet 5  . metoprolol tartrate (LOPRESSOR) 25 MG tablet take 1 tablet by mouth twice a day 180 tablet 1   No current facility-administered medications on file prior to visit.     Objective:  Objective Physical Exam  Constitutional: She is oriented to person, place, and time. She appears well-developed and well-nourished.  HENT:  Head: Normocephalic and atraumatic.  Eyes: Conjunctivae and EOM are normal.  Neck: Normal range of motion. Neck supple. No JVD present. Carotid bruit is not present. No thyromegaly present.  Cardiovascular: Normal rate, regular rhythm and normal heart sounds.   No murmur heard. Pulmonary/Chest: Effort normal and breath sounds normal. No respiratory distress. She has no wheezes. She has no rales. She exhibits no tenderness.  Musculoskeletal: She exhibits no edema.  Neurological: She is alert and oriented to person, place, and time.  Skin: There is erythema.     Psychiatric: She has a normal mood and affect. Her behavior is normal. Judgment and thought content normal.  Nursing note and vitals reviewed.  BP 152/82 mmHg  Pulse 81  Temp(Src) 98.2 F (36.8 C) (Oral)  Ht _0  (1.6 m)  Wt 199 lb 9.6 oz (90.538 kg)  BMI 35.37 kg/m2  SpO2 9%  LMP  (LMP Unknown) Wt Readings from Last 3 Encounters:  04/20/16 199 lb 9.6 oz (90.538 kg)  04/09/16 197 lb (89.359 kg)  02/29/16 196 lb 6.4 oz (89.086 kg)     Lab Results  Component Value Date   WBC 6.4 03/05/2015   HGB  16.0* 03/05/2015   HCT 45.8 03/05/2015   PLT 125 Platelet count consistent in citrate* 03/05/2015   GLUCOSE 444* 01/19/2015   CHOL 155 01/19/2015   TRIG 202.0* 01/19/2015   HDL 41.90 01/19/2015   LDLDIRECT 89.0 01/19/2015   LDLCALC 170 12/03/2015   ALT 47* 12/03/2015   AST 41* 12/03/2015   NA 141 12/03/2015   K 4.6 12/03/2015   CL 98 01/19/2015   CREATININE 0.8 12/03/2015   BUN 25* 12/03/2015   CO2 28 01/19/2015   TSH 0.95 12/03/2015   HGBA1C 11.3 12/02/2015   MICROALBUR 400 12/03/2015   MICROALBUR >400 12/03/2015    Dg Tibia/fibula Right  04/09/2016  CLINICAL DATA:  Painful black sore at posterior midshaft of tibia and fibular for 1.5 weeks pain EXAM: RIGHT TIBIA AND FIBULA - 2 VIEW COMPARISON:  None FINDINGS: Diffuse osseous demineralization. Joint space narrowing spur formation at medial compartment RIGHT knee. Chondrocalcinosis RIGHT knee and RIGHT ankle question CPPD. No acute fracture, dislocation, or bone destruction. Mild tendinous calcification at distal Achilles tendon. No definite focal soft tissue abnormalities. IMPRESSION: Osseous demineralization with degenerative changes RIGHT knee and question CPPD. No acute abnormalities. Electronically Signed   By: Lavonia Dana M.D.   On: 04/09/2016 17:14     Assessment & Plan:  Plan I have discontinued [name]'s Vitamin D (Ergocalciferol). I am also having her start on mirabegron ER. Additionally, I am having her maintain her insulin glargine, ezetimibe-simvastatin, insulin glulisine, levothyroxine, amLODipine, losartan, metoprolol tartrate, LORazepam, gabapentin, doxycycline, traMADol, and ondansetron.  Meds ordered this encounter  Medications  . gabapentin (NEURONTIN) 100 MG capsule    Sig: Take 1 capsule (100 mg total) by mouth 2 (two) times daily.    Dispense:  60 capsule    Refill:  5  . doxycycline (VIBRAMYCIN) 100 MG capsule    Sig: Take 1 capsule (100 mg total) by mouth 2 (two) times daily.    Dispense:  14 capsule     Refill:  0  . traMADol (ULTRAM) 50 MG tablet    Sig: TAKE 2 TABLETS BY MOUTH TWICE A DAY AS NEEDED    Dispense:  120 tablet    Refill:  0    Not to exceed 5 additional fills before 06/28/2016.  . ondansetron (ZOFRAN) 4 MG tablet    Sig: Take 1 tablet (4 mg total) by mouth every 8 (eight) hours as needed for nausea or vomiting.    Dispense:  15 tablet    Refill:  1  . mirabegron ER (MYRBETRIQ) 25 MG TB24 tablet    Sig: Take 1 tablet (25 mg total) by mouth daily.    Dispense:  30 tablet    Refill:  5    Problem List Items Addressed This Visit      Unprioritized   CTS (carpal tunnel syndrome)   Relevant Medications   gabapentin (NEURONTIN) 100 MG capsule   Other Relevant Orders   Ambulatory referral to Hand  Surgery    Other Visit Diagnoses    Cellulitis of leg, right    -  Primary    Relevant Medications    doxycycline (VIBRAMYCIN) 100 MG capsule    Chronic pain syndrome        Relevant Medications    gabapentin (NEURONTIN) 100 MG capsule    traMADol (ULTRAM) 50 MG tablet    Nausea without vomiting        Relevant Medications    ondansetron (ZOFRAN) 4 MG tablet    Urinary incontinence, unspecified incontinence type        Relevant Medications    mirabegron ER (MYRBETRIQ) 25 MG TB24 tablet    History of renal cell carcinoma        Relevant Orders    Ambulatory referral to Urology       Follow-up: Return if symptoms worsen or fail to improve.  Ann Held, DO

## 2016-05-09 ENCOUNTER — Other Ambulatory Visit: Payer: Self-pay | Admitting: Family Medicine

## 2016-05-09 NOTE — Telephone Encounter (Signed)
Last seen 04/20/16 and filled 04/12/16 #60   Please advise    KP

## 2016-05-23 ENCOUNTER — Other Ambulatory Visit: Payer: Self-pay | Admitting: Family Medicine

## 2016-05-23 NOTE — Telephone Encounter (Signed)
Pt called back in to follow up on refill request. Informed pt of status. She says that she is having hand pain and need Rx as soon as possible.

## 2016-05-23 NOTE — Telephone Encounter (Signed)
Last seen 04/20/16 and filled 04/20/16 #120 UDS 09/23/15 low risk   Please advise    KP

## 2016-05-23 NOTE — Telephone Encounter (Signed)
Patient checking on the status of traMADol (ULTRAM) 50 MG tablet request

## 2016-05-31 ENCOUNTER — Other Ambulatory Visit: Payer: Self-pay | Admitting: Family Medicine

## 2016-05-31 NOTE — Telephone Encounter (Signed)
Medication filled to pharmacy as requested.   

## 2016-06-05 ENCOUNTER — Other Ambulatory Visit: Payer: Self-pay | Admitting: Family Medicine

## 2016-06-26 ENCOUNTER — Other Ambulatory Visit: Payer: Self-pay | Admitting: Family Medicine

## 2016-06-26 NOTE — Telephone Encounter (Signed)
Last seen 04/20/16 and filled 05/09/16 #60   Please advise   KP

## 2016-06-29 ENCOUNTER — Other Ambulatory Visit: Payer: Self-pay | Admitting: Family Medicine

## 2016-06-29 NOTE — Telephone Encounter (Signed)
Requesting: Tramadol Contract  Signed on 01/12/2015 UDS   Moderate---due Last OV  04/20/2016 Last Refill   #120  With 0 refills on 05/23/2016  Please Advise

## 2016-07-11 ENCOUNTER — Other Ambulatory Visit: Payer: Self-pay | Admitting: Family Medicine

## 2016-07-11 DIAGNOSIS — E039 Hypothyroidism, unspecified: Secondary | ICD-10-CM

## 2016-07-18 ENCOUNTER — Other Ambulatory Visit: Payer: Self-pay | Admitting: Family Medicine

## 2016-07-19 DIAGNOSIS — G5603 Carpal tunnel syndrome, bilateral upper limbs: Secondary | ICD-10-CM | POA: Diagnosis not present

## 2016-07-19 DIAGNOSIS — M79641 Pain in right hand: Secondary | ICD-10-CM | POA: Diagnosis not present

## 2016-07-19 DIAGNOSIS — R52 Pain, unspecified: Secondary | ICD-10-CM | POA: Diagnosis not present

## 2016-07-25 ENCOUNTER — Other Ambulatory Visit: Payer: Self-pay | Admitting: Family Medicine

## 2016-07-28 ENCOUNTER — Other Ambulatory Visit: Payer: Self-pay | Admitting: Family Medicine

## 2016-07-28 NOTE — Telephone Encounter (Signed)
Patient has to pick up Rx and provide UDS.

## 2016-07-30 ENCOUNTER — Other Ambulatory Visit: Payer: Self-pay | Admitting: Family Medicine

## 2016-07-31 ENCOUNTER — Encounter: Payer: Self-pay | Admitting: Family Medicine

## 2016-07-31 DIAGNOSIS — Z79891 Long term (current) use of opiate analgesic: Secondary | ICD-10-CM | POA: Diagnosis not present

## 2016-07-31 DIAGNOSIS — Z79899 Other long term (current) drug therapy: Secondary | ICD-10-CM | POA: Diagnosis not present

## 2016-08-03 ENCOUNTER — Telehealth: Payer: Self-pay | Admitting: Family Medicine

## 2016-08-03 NOTE — Telephone Encounter (Signed)
Left a message for call back.

## 2016-08-03 NOTE — Telephone Encounter (Signed)
Relation to SU:ORVI Call back number:[phone] Pharmacy:  Reason for call:  Patient inquiring about UDS results please advise

## 2016-08-04 DIAGNOSIS — N3946 Mixed incontinence: Secondary | ICD-10-CM | POA: Diagnosis not present

## 2016-08-04 DIAGNOSIS — R3915 Urgency of urination: Secondary | ICD-10-CM | POA: Diagnosis not present

## 2016-08-04 DIAGNOSIS — R351 Nocturia: Secondary | ICD-10-CM | POA: Diagnosis not present

## 2016-08-04 DIAGNOSIS — Z87442 Personal history of urinary calculi: Secondary | ICD-10-CM | POA: Diagnosis not present

## 2016-08-04 NOTE — Telephone Encounter (Signed)
when they come back, we will call her.    KP

## 2016-08-04 NOTE — Telephone Encounter (Signed)
Pt called in to follow up on her lab results. Informed pt of the message below per CMA. Pt says that her medication supply will be running out on Sunday.

## 2016-08-08 ENCOUNTER — Telehealth: Payer: Self-pay | Admitting: Family Medicine

## 2016-08-08 ENCOUNTER — Other Ambulatory Visit: Payer: Self-pay | Admitting: Family Medicine

## 2016-08-08 MED ORDER — TRAMADOL HCL 50 MG PO TABS: ORAL_TABLET | ORAL | 0 refills | Status: DC

## 2016-08-08 NOTE — Telephone Encounter (Signed)
Requesting:  Tramadol Contract   01/12/2015 UDS   Moderate--overdue Last OV   04/20/2016 Last Refill   #30 with 0 refills on 07/28/2016  Please Advise

## 2016-08-08 NOTE — Telephone Encounter (Signed)
Completed.

## 2016-08-08 NOTE — Telephone Encounter (Signed)
Faxed hardcopy for Tr[facility] to [facility] [place]

## 2016-08-08 NOTE — Telephone Encounter (Signed)
Patient only received #30 on the 07/28/16 Rx and she normally gets #120. UDS pending.  Please advise     KP

## 2016-08-09 NOTE — Telephone Encounter (Signed)
I saw uds last night--- med printed and faxed

## 2016-08-09 NOTE — Telephone Encounter (Signed)
Pt called in for UDS results. Spoke with CMA , advised pt that she is awaiting PCP response / to result lab. She will call pt as soon as primary advise on medication.    Pt expressed understanding.

## 2016-08-15 DIAGNOSIS — R1031 Right lower quadrant pain: Secondary | ICD-10-CM | POA: Diagnosis not present

## 2016-08-28 ENCOUNTER — Telehealth: Payer: Self-pay | Admitting: Family Medicine

## 2016-08-28 ENCOUNTER — Other Ambulatory Visit: Payer: Self-pay | Admitting: Family Medicine

## 2016-08-28 NOTE — Telephone Encounter (Signed)
Duplicate request.    KP

## 2016-08-28 NOTE — Telephone Encounter (Signed)
Patient is requesting a refill of LORazepam (ATIVAN) 0.5 MG tablet   Patient Phone: [phone] Patient Relation: Self Pharmacy:  [place], [place]

## 2016-08-28 NOTE — Telephone Encounter (Signed)
Last seen 04/20/16 and filled 07/25/16 #60   Please advise   KP

## 2016-09-06 ENCOUNTER — Other Ambulatory Visit: Payer: Self-pay | Admitting: Family Medicine

## 2016-09-07 ENCOUNTER — Other Ambulatory Visit: Payer: Self-pay | Admitting: Family Medicine

## 2016-09-07 NOTE — Telephone Encounter (Deleted)
Last seen 04/20/16 Last filled #120

## 2016-09-07 NOTE — Telephone Encounter (Signed)
Rx faxed.    KP 

## 2016-09-08 ENCOUNTER — Other Ambulatory Visit: Payer: Self-pay | Admitting: Family Medicine

## 2016-09-25 ENCOUNTER — Other Ambulatory Visit: Payer: Self-pay | Admitting: Family Medicine

## 2016-09-25 NOTE — Telephone Encounter (Signed)
Last seen 04/20/16 and filled 08/28/16 #60   Please advise    KP

## 2016-10-16 ENCOUNTER — Other Ambulatory Visit: Payer: Self-pay | Admitting: Family Medicine

## 2016-10-17 ENCOUNTER — Other Ambulatory Visit: Payer: Self-pay | Admitting: Family Medicine

## 2016-10-17 NOTE — Telephone Encounter (Signed)
Dr Carollee Herter-- please advise when pt should follow up with you?  Last Tramadol Rx: 09/20/16, #120 Last OV: 04/20/16 Next OV:  None scheduled / recommended UDS: low risk, 07/31/16  Rx printed and forwarded to PCP for signature.

## 2016-10-17 NOTE — Telephone Encounter (Signed)
Please advise. TL/CMA

## 2016-10-19 ENCOUNTER — Telehealth: Payer: Self-pay | Admitting: Family Medicine

## 2016-10-19 NOTE — Telephone Encounter (Signed)
Called Margaret Dyer to schedule awv appt with health coach. Left msg for pt to call office to schedule appt.

## 2016-10-21 ENCOUNTER — Telehealth: Payer: Self-pay | Admitting: Family Medicine

## 2016-10-23 NOTE — Telephone Encounter (Signed)
Rx request faxed to pharmacy/[facility]

## 2016-10-23 NOTE — Telephone Encounter (Signed)
Pt now has appointment on 10/24/16 at 4pm.

## 2016-10-23 NOTE — Telephone Encounter (Signed)
As per patient spoke with pharmacy shortly ago,pharmacy never received Rx, please advise

## 2016-10-23 NOTE — Telephone Encounter (Signed)
eScribe request from [facility] for refill on Lorazepam Last filled - 09/25/16, #60x0 Last UDS - 07/31/16 [not detected - low risk] Last AEX -Please Advise on refills/[facility] 11/20

## 2016-10-23 NOTE — Telephone Encounter (Signed)
Spoke with pharmacy and was told that patient has called Multiple times today and she has been informed that she Cannot pick-up her medication today, because Insurance is denying, d/t Too Soon to fill; therefore, if she calls back; please let her know that the pharmacy does have the Rx [since 10:15am today], but Insurance will not allow Rx to be filled today and that this has been confirmed by her pharmacy/[facility] 11/20

## 2016-10-24 ENCOUNTER — Encounter: Payer: Self-pay | Admitting: Family Medicine

## 2016-10-24 ENCOUNTER — Ambulatory Visit (INDEPENDENT_AMBULATORY_CARE_PROVIDER_SITE_OTHER): Payer: Medicare Other | Admitting: Family Medicine

## 2016-10-24 VITALS — BP 142/80 | HR 78 | Temp 98.7°F | Resp 16 | Ht 63.0 in | Wt 204.4 lb

## 2016-10-24 DIAGNOSIS — L03116 Cellulitis of left lower limb: Secondary | ICD-10-CM | POA: Diagnosis not present

## 2016-10-24 DIAGNOSIS — Z23 Encounter for immunization: Secondary | ICD-10-CM | POA: Diagnosis not present

## 2016-10-24 MED ORDER — DOXYCYCLINE HYCLATE 100 MG PO TABS: 100.0000 mg | ORAL_TABLET | Freq: Two times a day (BID) | ORAL | 0 refills | Status: DC

## 2016-10-24 MED ORDER — FLUCONAZOLE 150 MG PO TABS: ORAL_TABLET | ORAL | 0 refills | Status: DC

## 2016-10-24 NOTE — Progress Notes (Signed)
Patient ID: Margaret Dyer, female    DOB: 1935-11-12  Age: 81 y.o. MRN: [id number]    Subjective:  Subjective  HPI Margaret Dyer presents for c/o wound Low L leg x 1 week  Review of Systems  Constitutional: Negative for activity change, appetite change, diaphoresis, fatigue and unexpected weight change.  Eyes: Negative for pain, redness and visual disturbance.  Respiratory: Negative for cough, chest tightness, shortness of breath and wheezing.   Cardiovascular: Negative for chest pain, palpitations and leg swelling.  Endocrine: Negative for cold intolerance, heat intolerance, polydipsia, polyphagia and polyuria.  Genitourinary: Negative for difficulty urinating, dysuria and frequency.  Skin: Positive for wound.  Neurological: Negative for dizziness, light-headedness, numbness and headaches.  Psychiatric/Behavioral: Negative for behavioral problems and dysphoric mood. The patient is not nervous/anxious.     History Past Medical History:  Diagnosis Date  . Asthma   . Blood transfusion   . Diabetes mellitus   . Goiter   . Hyperlipidemia   . Hypertension   . Osteopenia   . Renal cell carcinoma     She has a past surgical history that includes Nephrectomy (09.2000); Appendectomy; Abdominal hysterectomy; Knee surgery; and Hemorrhoid surgery.   Her family history includes Cancer in her father and sister; Kidney cancer in her father.She reports that she has quit smoking. She has never used smokeless tobacco. She reports that she does not drink alcohol or use drugs.  Current Outpatient Prescriptions on File Prior to Visit  Medication Sig Dispense Refill  . amLODipine (NORVASC) 10 MG tablet take 1 tablet by mouth once daily 30 tablet 5  . ezetimibe-simvastatin (VYTORIN) 10-20 MG per tablet Take 1 tablet by mouth at bedtime.    . gabapentin (NEURONTIN) 100 MG capsule Take 1 capsule (100 mg total) by mouth 2 (two) times daily. 60 capsule 5  . insulin glargine (LANTUS) 100 UNIT/ML  injection Inject 55 Units into the skin every evening.     . insulin glulisine (APIDRA) 100 UNIT/ML injection 40 u sq in am and 80 u sq q pm 10 mL 11  . levothyroxine (SYNTHROID, LEVOTHROID) 25 MCG tablet take 1 tablet by mouth once daily 30 tablet 5  . LORazepam (ATIVAN) 0.5 MG tablet take 1 tablet by mouth twice a day if needed 60 tablet 0  . losartan (COZAAR) 100 MG tablet take 1 tablet by mouth once daily 30 tablet 5  . metoprolol tartrate (LOPRESSOR) 25 MG tablet take 1 tablet by mouth twice a day 180 tablet 0  . ondansetron (ZOFRAN) 4 MG tablet Take 1 tablet (4 mg total) by mouth every 8 (eight) hours as needed for nausea or vomiting. 15 tablet 1  . traMADol (ULTRAM) 50 MG tablet TAKE 2 TABLETS BY MOUTH TWICE A DAY AS NEEDED 120 tablet 0   No current facility-administered medications on file prior to visit.      Objective:  Objective  Physical Exam  Constitutional: She is oriented to person, place, and time. She appears well-developed and well-nourished.  HENT:  Head: Normocephalic and atraumatic.  Eyes: Conjunctivae and EOM are normal.  Neck: Normal range of motion. Neck supple. No JVD present. Carotid bruit is not present. No thyromegaly present.  Cardiovascular: Normal rate, regular rhythm and normal heart sounds.   No murmur heard. Pulmonary/Chest: Effort normal and breath sounds normal. No respiratory distress. She has no wheezes. She has no rales. She exhibits no tenderness.  Musculoskeletal: She exhibits no edema.  Neurological: She is alert and oriented to  person, place, and time.  Skin:     Psychiatric: She has a normal mood and affect.  Nursing note and vitals reviewed.  BP (!) 142/80 (BP Location: Right Arm, Patient Position: Sitting, Cuff Size: Large)   Pulse 78   Temp 98.7 F (37.1 C) (Oral)   Resp 16   Ht _0  (1.6 m)   Wt 204 lb 6.4 oz (92.7 kg)   LMP  (LMP Unknown)   SpO2 95%   BMI 36.21 kg/m  Wt Readings from Last 3 Encounters:  10/24/16 204 lb 6.4  oz (92.7 kg)  04/20/16 199 lb 9.6 oz (90.5 kg)  04/09/16 197 lb (89.4 kg)  doxy y   Lab Results  Component Value Date   WBC 6.4 03/05/2015   HGB 16.0 (H) 03/05/2015   HCT 45.8 03/05/2015   PLT 125 Platelet count consistent in citrate (L) 03/05/2015   GLUCOSE 444 (H) 01/19/2015   CHOL 155 01/19/2015   TRIG 202.0 (H) 01/19/2015   HDL 41.90 01/19/2015   LDLDIRECT 89.0 01/19/2015   LDLCALC 170 12/03/2015   ALT 47 (A) 12/03/2015   AST 41 (A) 12/03/2015   NA 141 12/03/2015   K 4.6 12/03/2015   CL 98 01/19/2015   CREATININE 0.8 12/03/2015   BUN 25 (A) 12/03/2015   CO2 28 01/19/2015   TSH 0.95 12/03/2015   HGBA1C 11.3 12/02/2015   MICROALBUR 400 12/03/2015   MICROALBUR >400 12/03/2015    Dg Tibia/fibula Right  Result Date: 04/09/2016 CLINICAL DATA:  Painful black sore at posterior midshaft of tibia and fibular for 1.5 weeks pain EXAM: RIGHT TIBIA [id number] ND FIBULA - 2 VIEW COMPARISON:  None  FINDINGS: Diffuse osseous demineralization. Joint space narrowing spur formation at medial compartment RIGHT knee. Chondrocalcinosis RIGHT knee and RIGHT ankle question CPPD. No acute fracture, dislocation, or bone destruction. Mild tendinous  calcification at distal Achilles tendon. No definite focal soft tissue abnormalities. IMPRESSION: Osseous demineralization with degenerative changes RIGHT knee and question CPPD. No acute abnormalities. Electronically Signed   By: Lavonia Dana M.D.   On: 04/09/2016 17:14     Assessment & Plan:  Plan  I have discontinued Margaret Dyer's mirabegron ER. I am also having her start on fluconazole. Additionally, I am having her maintain her insulin glargine, ezetimibe-simvastatin, insulin glulisine, gabapentin, ondansetron, amLODipine, losartan, levothyroxine, metoprolol tartrate, traMADol, LORazepam, and doxycycline.  Meds ordered this encounter  Medications  . DISCONTD: doxycycline (VIBRA-TABS) 100 MG tablet    Sig: Take 1 tablet (100 mg total) by mouth  2 (two) times daily.    Dispense:  20 tablet    Refill:  0  . fluconazole (DIFLUCAN) 150 MG tablet    Sig: 1 po x1, may repeat in 3 days prn    Dispense:  2 tablet    Refill:  0  . doxycycline (VIBRA-TABS) 100 MG tablet    Sig: Take 1 tablet (100 mg total) by mouth 2 (two) times daily.    Dispense:  20 tablet    Refill:  0    Problem List Items Addressed This Visit    None    Visit Diagnoses    Cellulitis of leg, left    -  Primary   Relevant Medications   doxycycline (VIBRA-TABS) 100 MG tablet   Need for Tdap vaccination       Relevant Orders   Tdap vaccine greater than or equal to 7yo IM (Completed)   Need for prophylactic vaccination and inoculation against influenza  Relevant Orders   Flu vaccine HIGH DOSE PF (Fluzone High dose) (Completed)    abx ointment and bandaid in place rto 2 week to recheck-- may need wound clinic Follow-up: No Follow-up on file.  Ann Held, DO     0

## 2016-10-24 NOTE — Progress Notes (Signed)
Pre visit review using our clinic review tool, if applicable. No additional management support is needed unless otherwise documented below in the visit note. 

## 2016-10-24 NOTE — Patient Instructions (Signed)
Cellulitis, Adult Cellulitis is a skin infection. The infected area is usually red and tender. This condition occurs most often in the arms and lower legs. The infection can travel to the muscles, blood, and underlying tissue and become serious. It is very important to get treated for this condition. What are the causes? Cellulitis is caused by bacteria. The bacteria enter through a break in the skin, such as a cut, burn, insect bite, open sore, or crack. What increases the risk? This condition is more likely to occur in people who:  Have a weak defense system (immune system).  Have open wounds on the skin such as cuts, burns, bites, and scrapes. Bacteria can enter the body through these open wounds.  Are older.  Have diabetes.  Have a type of long-lasting (chronic) liver disease (cirrhosis) or kidney disease.  Use IV drugs. What are the signs or symptoms? Symptoms of this condition include:  Redness, streaking, or spotting on the skin.  Swollen area of the skin.  Tenderness or pain when an area of the skin is touched.  Warm skin.  Fever.  Chills.  Blisters. How is this diagnosed? This condition is diagnosed based on a medical history and physical exam. You may also have tests, including:  Blood tests.  Lab tests.  Imaging tests. How is this treated? Treatment for this condition may include:  Medicines, such as antibiotic medicines or antihistamines.  Supportive care, such as rest and application of cold or warm cloths (cold or warm compresses) to the skin.  Hospital care, if the condition is severe. The infection usually gets better within 1-2 days of treatment. Follow these instructions at home:  Take over-the-counter and prescription medicines only as told by your health care provider.  If you were prescribed an antibiotic medicine, take it as told by your health care provider. Do not stop taking the antibiotic even if you start to feel better.  Drink  enough fluid to keep your urine clear or pale yellow.  Do not touch or rub the infected area.  Raise (elevate) the infected area above the level of your heart while you are sitting or lying down.  Apply warm or cold compresses to the affected area as told by your health care provider.  Keep all follow-up visits as told by your health care provider. This is important. These visits let your health care provider make sure a more serious infection is not developing. Contact a health care provider if:  You have a fever.  Your symptoms do not improve within 1-2 days of starting treatment.  Your bone or joint underneath the infected area becomes painful after the skin has healed.  Your infection returns in the same area or another area.  You notice a swollen bump in the infected area.  You develop new symptoms.  You have a general ill feeling (malaise) with muscle aches and pains. Get help right away if:  Your symptoms get worse.  You feel very sleepy.  You develop vomiting or diarrhea that persists.  You notice red streaks coming from the infected area.  Your red area gets larger or turns dark in color. This information is not intended to replace advice given to you by your health care provider. Make sure you discuss any questions you have with your health care provider. Document Released: 08/30/2005 Document Revised: 03/30/2016 Document Reviewed: 09/29/2015 Elsevier Interactive Patient Education  2017 [employer].

## 2016-11-21 ENCOUNTER — Other Ambulatory Visit: Payer: Self-pay | Admitting: Family Medicine

## 2016-11-22 ENCOUNTER — Encounter: Payer: Self-pay | Admitting: Family Medicine

## 2016-11-22 NOTE — Telephone Encounter (Signed)
Patient requesting refill for Lorazepam she has one remaining. She says she is going through a lot of stress with husbands  Medical needs. Told I would forward to covering provider. Call back number [phone] per patient this is her second request

## 2016-11-22 NOTE — Telephone Encounter (Signed)
Rx faxed to [facility] pharmacy/[facility] 12/20

## 2016-11-22 NOTE — Telephone Encounter (Signed)
LMOVM to inquire as to which of patient's [3] local pharmacies that she would like Lorazepam Rx to be faxed to/[facility] 12/20

## 2016-11-22 NOTE — Telephone Encounter (Signed)
Refilled her ativan. Appears she is not early. Filling rx since her pcp is not available/covering for pcp.

## 2016-11-24 ENCOUNTER — Other Ambulatory Visit: Payer: Self-pay | Admitting: Family Medicine

## 2016-11-24 ENCOUNTER — Telehealth: Payer: Self-pay | Admitting: Family Medicine

## 2016-11-24 NOTE — Telephone Encounter (Signed)
Patient called back regarding getting this medication. I informed the patient that the CMA would need to check with Dr. Carollee Herter before filling rx. She is aware Dr. Carollee Herter will return on Tuesday 11/28/16

## 2016-11-24 NOTE — Telephone Encounter (Signed)
Patient is calling to request a refill of traMADol (ULTRAM) 50 MG tablet  Patient states that her back is hurting and she would like to try and get it today. Please advise   Pharmacy[id number] [place]

## 2016-11-24 NOTE — Telephone Encounter (Signed)
Last ov 10/24/16. Last fill 10/17/16 #120 0. Please advise. LB

## 2016-11-24 NOTE — Telephone Encounter (Signed)
See rx.

## 2016-11-24 NOTE — Telephone Encounter (Signed)
Patient checking on the status of message below, please advise

## 2016-11-24 NOTE — Telephone Encounter (Signed)
See FYI note. Rx is sent to be shredded. LB

## 2016-11-28 NOTE — Telephone Encounter (Signed)
Pt called in to follow up on request due to provider being back in the office. Advised that Im not showing refill at this time. Pt says that she will follow up later today.

## 2016-11-28 NOTE — Telephone Encounter (Signed)
Last ov 10/24/16. Last fill 11/07/16 #120 0. Please advice. LB

## 2016-11-28 NOTE — Telephone Encounter (Signed)
Patient called to follow up on this medication request. She states that she is having a lot of pain in her back. Please advise.

## 2016-11-28 NOTE — Telephone Encounter (Signed)
Patient checking on the status of medication below, please advise

## 2016-11-29 NOTE — Telephone Encounter (Signed)
[facility] called stating that [facility] that [facility] was written on November 14. Last refilled on 12/5 for #30. Plse adv.

## 2016-11-29 NOTE — Telephone Encounter (Signed)
[id number], please follow up with patient.

## 2016-11-30 DIAGNOSIS — Z803 Family history of malignant neoplasm of breast: Secondary | ICD-10-CM | POA: Diagnosis not present

## 2016-11-30 DIAGNOSIS — M810 Age-related osteoporosis without current pathological fracture: Secondary | ICD-10-CM | POA: Diagnosis not present

## 2016-11-30 DIAGNOSIS — Z1231 Encounter for screening mammogram for malignant neoplasm of breast: Secondary | ICD-10-CM | POA: Diagnosis not present

## 2016-11-30 LAB — HM MAMMOGRAPHY

## 2016-11-30 LAB — HM DEXA SCAN

## 2016-11-30 NOTE — Telephone Encounter (Signed)
Called pt to inform her that her Rx was sent to pharmacy. Called pt mobile phone left message on pt vm, and also called pt's home phone, grandson said pt is out taking her husband to an appointment. Rx sent to pharmacy. Fax confirmation received. Will try and call pt again at a later time. LB

## 2016-12-05 ENCOUNTER — Encounter: Payer: Self-pay | Admitting: Family Medicine

## 2016-12-07 ENCOUNTER — Telehealth: Payer: Self-pay | Admitting: Family Medicine

## 2016-12-07 NOTE — Telephone Encounter (Signed)
lvm advising patient to schedule medicare wellness appointment.

## 2016-12-24 ENCOUNTER — Other Ambulatory Visit: Payer: Self-pay | Admitting: Medical

## 2016-12-24 ENCOUNTER — Other Ambulatory Visit: Payer: Self-pay | Admitting: Family Medicine

## 2016-12-24 DIAGNOSIS — G894 Chronic pain syndrome: Secondary | ICD-10-CM

## 2016-12-26 NOTE — Telephone Encounter (Signed)
Last seen 10/24/16  Last filled 11/22/16 By Orvan Falconer  Please advise PC

## 2016-12-29 ENCOUNTER — Other Ambulatory Visit: Payer: Self-pay | Admitting: Family Medicine

## 2017-01-01 ENCOUNTER — Telehealth: Payer: Self-pay | Admitting: Family Medicine

## 2017-01-01 NOTE — Telephone Encounter (Signed)
Patient called back say pharmacy did not get rx  Ativan phone to the pharmacy [phone]

## 2017-01-01 NOTE — Telephone Encounter (Signed)
Patient says she had scan completed at [facility] mammogram   Please see patients refill request

## 2017-01-01 NOTE — Telephone Encounter (Signed)
Patient called again about lorazepam.  It looks like it was approved and not sent or called in. Advised patient that rx would be called in.  Spoke with pharmacy and gave verbal rx.

## 2017-01-01 NOTE — Telephone Encounter (Signed)
Patient called to follow up on this medication refill. She wants to pick up the prescription today because. Please advise

## 2017-01-01 NOTE — Telephone Encounter (Signed)
Patient is requesting a copy of her bone density scan sent to her home address. Please advise

## 2017-01-01 NOTE — Telephone Encounter (Signed)
Patient called stating that the pharmacy did not get this medication refill. Please advise.  LORazepam (ATIVAN) 0.5 MG tablet   Pharmacy: [facility], [place]

## 2017-01-01 NOTE — Telephone Encounter (Signed)
If patient calls back need to know when and where she got the test done. Could not find it in chart.  Patient was left detailed message to call the office back with this information.  PC

## 2017-01-02 NOTE — Telephone Encounter (Signed)
Called the patient back to clarify which result she is requesting.

## 2017-01-03 NOTE — Telephone Encounter (Signed)
Called the patient and informed family member to have her call us back once gets back home

## 2017-01-10 ENCOUNTER — Encounter: Payer: Self-pay | Admitting: *Deleted

## 2017-01-10 MED ORDER — ALENDRONATE SODIUM 70 MG PO TABS: 70.0000 mg | ORAL_TABLET | ORAL | 11 refills | Status: DC

## 2017-01-10 NOTE — Telephone Encounter (Signed)
Left message with gentleman for patient to call back for bone density results.  Per Dr. Etter Sjogren scan showed osteoporosis should take OTC Ca 1237m + Vit D 2000 units daily, weight bearing exercises, also needs to start fosamax 715mweekly and recheck in 2 years.  Rx sent to pharmacy for fosamax.

## 2017-01-10 NOTE — Addendum Note (Signed)
Addended by: Kem Boroughs D on: 01/10/2017 11:43 AM   Modules accepted: Orders

## 2017-01-13 ENCOUNTER — Other Ambulatory Visit: Payer: Self-pay | Admitting: Family Medicine

## 2017-01-16 DIAGNOSIS — M25472 Effusion, left ankle: Secondary | ICD-10-CM | POA: Diagnosis not present

## 2017-01-16 DIAGNOSIS — M25572 Pain in left ankle and joints of left foot: Secondary | ICD-10-CM | POA: Diagnosis not present

## 2017-01-17 ENCOUNTER — Other Ambulatory Visit (HOSPITAL_COMMUNITY): Payer: Self-pay | Admitting: Urgent Care

## 2017-01-17 DIAGNOSIS — M19072 Primary osteoarthritis, left ankle and foot: Secondary | ICD-10-CM | POA: Diagnosis not present

## 2017-01-17 DIAGNOSIS — M25472 Effusion, left ankle: Secondary | ICD-10-CM | POA: Diagnosis not present

## 2017-01-17 DIAGNOSIS — M25572 Pain in left ankle and joints of left foot: Secondary | ICD-10-CM

## 2017-01-17 DIAGNOSIS — Z794 Long term (current) use of insulin: Secondary | ICD-10-CM | POA: Diagnosis not present

## 2017-01-17 DIAGNOSIS — E1165 Type 2 diabetes mellitus with hyperglycemia: Secondary | ICD-10-CM | POA: Diagnosis not present

## 2017-01-18 ENCOUNTER — Other Ambulatory Visit: Payer: Self-pay | Admitting: Family

## 2017-01-18 NOTE — Telephone Encounter (Signed)
Patient states she is out of this medication. She states that she hurt her ankle and was seen at [facility]. She states she is in a lot of pain and would like to get this medication refilled today. Please advise

## 2017-01-19 NOTE — Telephone Encounter (Signed)
Patient is calling to follow up on this medication refill. Please advise

## 2017-01-19 NOTE — Telephone Encounter (Signed)
[facility] [place]

## 2017-01-19 NOTE — Telephone Encounter (Signed)
Patient aware

## 2017-01-19 NOTE — Telephone Encounter (Signed)
Last refill #120 no refills on 11/24/2016 Last office visit on 10/24/2016 no upcoming appt. Scheduled. Contract signed on 01/12/2015 UDS--Mdo[place]-due

## 2017-01-24 ENCOUNTER — Ambulatory Visit (HOSPITAL_COMMUNITY)
Admission: RE | Admit: 2017-01-24 | Discharge: 2017-01-24 | Disposition: A | Discharge status: Home / Self Care | Payer: Medicare Other | Source: Ambulatory Visit | Attending: Urgent Care | Admitting: Urgent Care

## 2017-01-24 DIAGNOSIS — E1165 Type 2 diabetes mellitus with hyperglycemia: Secondary | ICD-10-CM | POA: Insufficient documentation

## 2017-01-24 DIAGNOSIS — S9032XA Contusion of left foot, initial encounter: Secondary | ICD-10-CM | POA: Insufficient documentation

## 2017-01-24 DIAGNOSIS — M25572 Pain in left ankle and joints of left foot: Secondary | ICD-10-CM | POA: Diagnosis not present

## 2017-01-24 DIAGNOSIS — Z794 Long term (current) use of insulin: Secondary | ICD-10-CM | POA: Insufficient documentation

## 2017-01-24 DIAGNOSIS — M25472 Effusion, left ankle: Secondary | ICD-10-CM | POA: Diagnosis not present

## 2017-01-24 DIAGNOSIS — M7989 Other specified soft tissue disorders: Secondary | ICD-10-CM | POA: Diagnosis not present

## 2017-01-31 ENCOUNTER — Other Ambulatory Visit: Payer: Self-pay | Admitting: Family Medicine

## 2017-01-31 NOTE — Telephone Encounter (Signed)
lvm advising patient to schedule AWV

## 2017-01-31 NOTE — Telephone Encounter (Addendum)
Result and below phone note mailed to pt. Notified pt of below results as well and she requests that we not send fosamax Rx at this time. She will call us back when she is ready to proceed. Results / recommendations mailed to pt.

## 2017-01-31 NOTE — Telephone Encounter (Signed)
Patient would like to get her bone density scan results sent to her home address. Please advise

## 2017-02-01 NOTE — Telephone Encounter (Signed)
Faxed hardcopy for Lor[facility]epam [facility]

## 2017-02-01 NOTE — Telephone Encounter (Signed)
Requesting:   Lorazepam Contract    01/12/2015 UDS   Low risk--due 01/31/2017 Last OV   10/24/2016 Last Refill    #60 no refills 12/27/2016  Please Advise

## 2017-02-14 ENCOUNTER — Other Ambulatory Visit: Payer: Self-pay | Admitting: Family Medicine

## 2017-02-21 ENCOUNTER — Ambulatory Visit: Payer: Medicare Other | Admitting: *Deleted

## 2017-02-21 ENCOUNTER — Telehealth: Payer: Self-pay | Admitting: Family Medicine

## 2017-02-21 NOTE — Telephone Encounter (Signed)
Relation to JZ:PHXT Call back [phone] Pharmacy: [facility], [place] [address] [phone] (Phone) [phone] (Fax)     Reason for call:  Patient experiencing coughing and head cold x2 patient states due to the weather she doesn't feel comfortable driving,please advise

## 2017-02-21 NOTE — Telephone Encounter (Signed)
Notified pt that she will need office visit for further evaluation. Pt declines to schedule appt at this time stating she will call back tomorrow if she feels well enough to drive. Pt states she doesn't have anyone else to transport her and would not schedule future appt at this time. Advised pt not to wait too long to be seen for her cough and she voices understanding.

## 2017-02-21 NOTE — Telephone Encounter (Signed)
Left message for pt to return my call.

## 2017-02-22 ENCOUNTER — Ambulatory Visit (INDEPENDENT_AMBULATORY_CARE_PROVIDER_SITE_OTHER): Payer: Medicare Other | Admitting: Family Medicine

## 2017-02-22 ENCOUNTER — Telehealth: Payer: Self-pay | Admitting: Family Medicine

## 2017-02-22 ENCOUNTER — Encounter: Payer: Self-pay | Admitting: Family Medicine

## 2017-02-22 ENCOUNTER — Ambulatory Visit (HOSPITAL_BASED_OUTPATIENT_CLINIC_OR_DEPARTMENT_OTHER)
Admission: RE | Admit: 2017-02-22 | Discharge: 2017-02-22 | Disposition: A | Discharge status: Home / Self Care | Payer: Medicare Other | Source: Ambulatory Visit | Attending: Family Medicine | Admitting: Family Medicine

## 2017-02-22 VITALS — BP 118/52 | HR 91 | Temp 98.7°F | Resp 16 | Ht 63.0 in | Wt 204.8 lb

## 2017-02-22 DIAGNOSIS — J069 Acute upper respiratory infection, unspecified: Secondary | ICD-10-CM | POA: Diagnosis not present

## 2017-02-22 DIAGNOSIS — J111 Influenza due to unidentified influenza virus with other respiratory manifestations: Secondary | ICD-10-CM

## 2017-02-22 DIAGNOSIS — R059 Cough, unspecified: Secondary | ICD-10-CM

## 2017-02-22 DIAGNOSIS — J45909 Unspecified asthma, uncomplicated: Secondary | ICD-10-CM

## 2017-02-22 DIAGNOSIS — S80212A Abrasion, left knee, initial encounter: Secondary | ICD-10-CM | POA: Diagnosis not present

## 2017-02-22 DIAGNOSIS — R05 Cough: Secondary | ICD-10-CM | POA: Diagnosis not present

## 2017-02-22 DIAGNOSIS — I517 Cardiomegaly: Secondary | ICD-10-CM | POA: Insufficient documentation

## 2017-02-22 MED ORDER — DOXYCYCLINE HYCLATE 100 MG PO TABS: 100.0000 mg | ORAL_TABLET | Freq: Two times a day (BID) | ORAL | 0 refills | Status: DC

## 2017-02-22 MED ORDER — LORAZEPAM 0.5 MG PO TABS: ORAL_TABLET | ORAL | 0 refills | Status: DC

## 2017-02-22 MED ORDER — PROMETHAZINE-DM 6.25-15 MG/5ML PO SYRP: 5.0000 mL | ORAL_SOLUTION | Freq: Four times a day (QID) | ORAL | 0 refills | Status: DC | PRN

## 2017-02-22 NOTE — Progress Notes (Signed)
Patient ID: Margaret Dyer, female   DOB: 1935-04-19, 82 y.o.   MRN: [id number]     Subjective:  I acted as a Education administrator for Dr. Carollee Herter.  Guerry Bruin, [place]   Patient ID: Margaret Dyer, female    DOB: 1935-07-07, 82 y.o.   MRN: [id number]  Chief Complaint  Patient presents with  . Cough  . Knee Pain    left knee pain, fell 2 days ago.    Cough  This is a new problem. Episode onset: 3-4 days. The cough is non-productive. Associated symptoms include ear pain, nasal congestion, postnasal drip, rhinorrhea, a sore throat and shortness of breath. Treatments tried: ibuprofen. The treatment provided mild relief.  Knee Pain   The incident occurred 2 days ago. Incident location: on the sidewalk. The injury mechanism was a fall. The pain is present in the left knee. She has tried NSAIDs for the symptoms. The treatment provided no relief.    Patient is in today for cough and knee pain.    Patient Care Team: Ann Held, DO as PCP - General Autumn Johnney Ou, PA-C (Internal Medicine)   Past Medical History:  Diagnosis Date  . Asthma   . Blood transfusion   . Diabetes mellitus   . Goiter   . Hyperlipidemia   . Hypertension   . Osteopenia   . Renal cell carcinoma     Past Surgical History:  Procedure Laterality Date  . ABDOMINAL HYSTERECTOMY    . APPENDECTOMY    . HEMORRHOID SURGERY    . KNEE SURGERY    . NEPHRECTOMY  09.2000    Family History  Problem Relation Age of Onset  . Cancer Sister     bladder  . Kidney cancer Father   . Cancer Father     renal  . Coronary artery disease    . Diabetes    . Hyperlipidemia    . Hypertension      Social History   Social History  . Marital status: Married    Spouse name: N/A  . Number of children: N/A  . Years of education: N/A   Occupational History  . Not on file.   Social History Main Topics  . Smoking status: Former Research scientist (life sciences)  . Smokeless tobacco: Never Used     Comment: never used tobacco  . Alcohol use No  .  Drug use: No  . Sexual activity: Not on file   Other Topics Concern  . Not on file   Social History Narrative  . No narrative on file    Outpatient Medications Prior to Visit  Medication Sig Dispense Refill  . amLODipine (NORVASC) 10 MG tablet take 1 tablet by mouth once daily 30 tablet 4  . ezetimibe-simvastatin (VYTORIN) 10-20 MG per tablet Take 1 tablet by mouth at bedtime.    . gabapentin (NEURONTIN) 100 MG capsule take 1 capsule by mouth twice a day 60 capsule 5  . insulin glargine (LANTUS) 100 UNIT/ML injection Inject 55 Units into the skin every evening.     . insulin glulisine (APIDRA) 100 UNIT/ML injection 40 u sq in am and 80 u sq q pm 10 mL 11  . levothyroxine (SYNTHROID, LEVOTHROID) 25 MCG tablet take 1 tablet by mouth once daily 30 tablet 5  . losartan (COZAAR) 100 MG tablet take 1 tablet by mouth once daily 30 tablet 1  . metoprolol tartrate (LOPRESSOR) 25 MG tablet take 1 tablet by mouth twice a day 180 tablet 0  .  traMADol (ULTRAM) 50 MG tablet TAKE 2 TABLETS BY MOUTH TWICE A DAY AS NEEDED 120 tablet 0  . LORazepam (ATIVAN) 0.5 MG tablet take 1 tablet by mouth twice a day if needed 60 tablet 0  . alendronate (FOSAMAX) 70 MG tablet Take 1 tablet (70 mg total) by mouth every 7 (seven) days. Take with a full glass of water on an empty stomach. (Patient not taking: Reported on 02/22/2017) 4 tablet 11  . doxycycline (VIBRA-TABS) 100 MG tablet Take 1 tablet (100 mg total) by mouth 2 (two) times daily. 20 tablet 0  . fluconazole (DIFLUCAN) 150 MG tablet 1 po x1, may repeat in 3 days prn 2 tablet 0  . ondansetron (ZOFRAN) 4 MG tablet Take 1 tablet (4 mg total) by mouth every 8 (eight) hours as needed for nausea or vomiting. 15 tablet 1   No facility-administered medications prior to visit.     Allergies  Allergen Reactions  . Cefuroxime Axetil     Upset stomach   . Ciprofloxacin     Upset stomach    Review of Systems  HENT: Positive for ear pain, postnasal drip,  rhinorrhea and sore throat.   Respiratory: Positive for cough and shortness of breath.        Objective:    Physical Exam  Constitutional: She is oriented to person, place, and time. She appears well-developed and well-nourished.  HENT:  Head: Normocephalic and atraumatic.  Eyes: Conjunctivae and EOM are normal.  Neck: Normal range of motion. Neck supple. No JVD present. Carotid bruit is not present. No thyromegaly present.  Cardiovascular: Normal rate, regular rhythm and normal heart sounds.   No murmur heard. Pulmonary/Chest: Effort normal and breath sounds normal. No respiratory distress. She has no wheezes. She has no rales. She exhibits no tenderness.  Musculoskeletal: She exhibits no edema.  Neurological: She is alert and oriented to person, place, and time.  Skin: There is erythema.     Psychiatric: She has a normal mood and affect.  Nursing note and vitals reviewed.   BP (!) 118/52   Pulse 91   Temp 98.7 F (37.1 C) (Oral)   Resp 16   Ht _0  (1.6 m)   Wt 204 lb 12.8 oz (92.9 kg)   LMP  (LMP Unknown)   SpO2 95%   BMI 36.28 kg/m  Wt Readings from Last 3 Encounters:  02/22/17 204 lb 12.8 oz (92.9 kg)  10/24/16 204 lb 6.4 oz (92.7 kg)  04/20/16 199 lb 9.6 oz (90.5 kg)   BP Readings from Last 3 Encounters:  02/22/17 (!) 118/52  10/24/16 (!) 142/80  04/20/16 (!) 152/82     Immunization History  Administered Date(s) Administered  . Influenza Split 09/29/2011  . Influenza Whole 09/04/1999, 09/20/2007, 09/04/2008, 01/18/2010, 09/19/2010  . Influenza, High Dose Seasonal PF 01/05/2014, 09/23/2015, 10/24/2016  . Influenza, Seasonal, Injecte, Preservative Fre 12/05/2012  . Influenza,inj,Quad PF,36+ Mos 10/27/2014  . Tdap 10/24/2016    Health Maintenance  Topic Date Due  . FOOT EXAM  05/19/1947  . OPHTHALMOLOGY EXAM  05/19/1947  . HEMOGLOBIN A1C  06/01/2016  . PNA vac Low Risk Adult (1 of 2 - PCV13) 02/28/2017 (Originally 05/18/2002)  . MAMMOGRAM  11/30/2017    . DEXA SCAN  11/30/2018  . TETANUS/TDAP  10/24/2026  . INFLUENZA VACCINE  Completed    Lab Results  Component Value Date   WBC 6.4 03/05/2015   HGB 16.0 (H) 03/05/2015   HCT 45.8 03/05/2015   PLT 125  Platelet count consistent in citrate (L) 03/05/2015   GLUCOSE 444 (H) 01/19/2015   CHOL 155 01/19/2015   TRIG 202.0 (H) 01/19/2015   HDL 41.90 01/19/2015   LDLDIRECT 89.0 01/19/2015   LDLCALC 170 12/03/2015   ALT 47 (A) 12/03/2015   AST 41 (A) 12/03/2015   NA 141 12/03/2015   K 4.6 12/03/2015   CL 98 01/19/2015   CREATININE 0.8 12/03/2015   BUN 25 (A) 12/03/2015   CO2 28 01/19/2015   TSH 0.95 12/03/2015   HGBA1C 11.3 12/02/2015   MICROALBUR 400 12/03/2015   MICROALBUR >400 12/03/2015    Lab Results  Component Value Date   TSH 0.95 12/03/2015   Lab Results  Component Value Date   WBC 6.4 03/05/2015   HGB 16.0 (H) 03/05/2015   HCT 45.8 03/05/2015   MCV 88 03/05/2015   PLT 125 Platelet count consistent in citrate (L) 03/05/2015   Lab Results  Component Value Date   NA 141 12/03/2015   K 4.6 12/03/2015   CO2 28 01/19/2015   GLUCOSE 444 (H) 01/19/2015   BUN 25 (A) 12/03/2015   CREATININE 0.8 12/03/2015   BILITOT 0.5 01/19/2015   ALKPHOS 88 01/19/2015   AST 41 (A) 12/03/2015   ALT 47 (A) 12/03/2015   PROT 6.8 01/19/2015   ALBUMIN 3.8 01/19/2015   CALCIUM 9.6 01/19/2015   GFR 57.71 (L) 01/19/2015   Lab Results  Component Value Date   CHOL 155 01/19/2015   Lab Results  Component Value Date   HDL 41.90 01/19/2015   Lab Results  Component Value Date   LDLCALC 170 12/03/2015   Lab Results  Component Value Date   TRIG 202.0 (H) 01/19/2015   Lab Results  Component Value Date   CHOLHDL 4 01/19/2015   Lab Results  Component Value Date   HGBA1C 11.3 12/02/2015         Assessment & Plan:   Problem List Items Addressed This Visit      Unprioritized   Abrasion, left knee, initial encounter    abx ointment and bandage       URI, acute     Nasal spray and otc antihistamine        Other Visit Diagnoses    Cough    -  Primary   Relevant Orders   DG Chest 2 View   Influenza          I have discontinued Ms. Conaty's ondansetron, fluconazole, and doxycycline. I am also having her start on doxycycline and promethazine-dextromethorphan. Additionally, I am having her maintain her insulin glargine, ezetimibe-simvastatin, insulin glulisine, levothyroxine, gabapentin, alendronate, metoprolol tartrate, losartan, traMADol, amLODipine, oxybutynin, and LORazepam.  Meds ordered this encounter  Medications  . oxybutynin (DITROPAN) 5 MG tablet    Sig: Take 5 mg by mouth 2 (two) times daily.    Refill:  0  . doxycycline (VIBRA-TABS) 100 MG tablet    Sig: Take 1 tablet (100 mg total) by mouth 2 (two) times daily.    Dispense:  20 tablet    Refill:  0  . promethazine-dextromethorphan (PROMETHAZINE-DM) 6.25-15 MG/5ML syrup    Sig: Take 5 mLs by mouth 4 (four) times daily as needed for cough.    Dispense:  118 mL    Refill:  0  . LORazepam (ATIVAN) 0.5 MG tablet    Sig: take 1 tablet by mouth twice a day if needed    Dispense:  60 tablet    Refill:  0  CMA served as Education administrator during this visit. History, Physical and Plan performed by medical provider. Documentation and orders reviewed and attested to.  Ann Held, DO

## 2017-02-22 NOTE — Assessment & Plan Note (Signed)
abx ointment and bandage

## 2017-02-22 NOTE — Progress Notes (Signed)
Pre visit review using our clinic review tool, if applicable. No additional management support is needed unless otherwise documented below in the visit note.

## 2017-02-22 NOTE — Patient Instructions (Signed)

## 2017-02-22 NOTE — Telephone Encounter (Signed)
Caller name: Relationship to patient: Self Can be reached: [phone] Pharmacy:  Reason for call: Request a Rx for nebs because of her recurring SOB. States she forgot to mention this to provider.

## 2017-02-22 NOTE — Assessment & Plan Note (Signed)
Nasal spray and otc antihistamine

## 2017-02-23 ENCOUNTER — Other Ambulatory Visit: Payer: Self-pay | Admitting: Family Medicine

## 2017-02-23 DIAGNOSIS — I509 Heart failure, unspecified: Secondary | ICD-10-CM

## 2017-02-23 MED ORDER — FUROSEMIDE 20 MG PO TABS: 20.0000 mg | ORAL_TABLET | Freq: Every day | ORAL | 2 refills | Status: DC

## 2017-02-23 MED ORDER — ALBUTEROL SULFATE (2.5 MG/3ML) 0.083% IN NEBU: 2.5000 mg | INHALATION_SOLUTION | Freq: Four times a day (QID) | RESPIRATORY_TRACT | 5 refills | Status: DC | PRN

## 2017-02-23 NOTE — Telephone Encounter (Signed)
Ok to get neb with albuterol  Qid prn -- dx asthma

## 2017-02-23 NOTE — Addendum Note (Signed)
Addended by: Kelle Darting A on: 02/23/2017 03:24 PM   Modules accepted: Orders

## 2017-02-23 NOTE — Telephone Encounter (Signed)
Albuterol Rx sent to [facility] and nebulizer Rx sent to [facility] at [phone]. Notified pt.

## 2017-02-27 ENCOUNTER — Telehealth: Payer: Self-pay | Admitting: Family Medicine

## 2017-02-27 NOTE — Telephone Encounter (Signed)
Caller name: Tiffany Relationship to patient: Ace Gins Can be reached: [phone] Pharmacy:  Reason for call: Request chart notes for the patient to have nebulizer

## 2017-02-27 NOTE — Telephone Encounter (Signed)
Melissa - [facility] called in to request Chart notes, Demographics, and insurance info for the pt.    Fax: [phone]  CB: [phone]

## 2017-02-27 NOTE — Telephone Encounter (Signed)
Faxed all information as requested

## 2017-02-28 NOTE — Telephone Encounter (Signed)
Copied addendumed note/faxed to [facility]/called tiffany at [facility] to inform notes have been faxed in.

## 2017-02-28 NOTE — Telephone Encounter (Signed)
Called [facility] and they have office notes dated 02/22/17--but no mention of the need for a nebulizer. They are first going to call the patient to see if she is just willing to pay for a nebulizer, if not they will call us back and see if PCP is willing to amend to office note on 02/22/17 to document the need for one.

## 2017-02-28 NOTE — Telephone Encounter (Signed)
[facility] did speak to the patient and she does not have the money to pay for a nebulizer. Would you be able to amend the note from her last Office visit on 02/22/17 to state her need for a nebulizer in order for her insurance to pay for it.  If not and she needs to come back in the office to be evaluated the patient is aware of that possibility and willing to do so. Just let me know.  Fax number for [facility] once completed/or amended notes to Attn::Tiffany at [phone]

## 2017-02-28 NOTE — Assessment & Plan Note (Signed)
rx for neb given Pt has easier time with nebulizer than inhalers and has needed albuterol more often

## 2017-02-28 NOTE — Telephone Encounter (Signed)
Note addended

## 2017-03-01 ENCOUNTER — Telehealth (HOSPITAL_COMMUNITY): Payer: Self-pay | Admitting: Family Medicine

## 2017-03-01 ENCOUNTER — Ambulatory Visit: Payer: Medicare Other | Admitting: Family Medicine

## 2017-03-03 ENCOUNTER — Telehealth: Payer: Self-pay | Admitting: Family Medicine

## 2017-03-05 ENCOUNTER — Telehealth: Payer: Self-pay | Admitting: Family Medicine

## 2017-03-05 ENCOUNTER — Ambulatory Visit (INDEPENDENT_AMBULATORY_CARE_PROVIDER_SITE_OTHER): Payer: Medicare Other | Admitting: Family Medicine

## 2017-03-05 ENCOUNTER — Ambulatory Visit (HOSPITAL_BASED_OUTPATIENT_CLINIC_OR_DEPARTMENT_OTHER): Admission: RE | Admit: 2017-03-05 | Payer: Medicare Other | Source: Ambulatory Visit

## 2017-03-05 VITALS — BP 144/55 | HR 79 | Temp 98.1°F | Ht 63.0 in | Wt 206.4 lb

## 2017-03-05 DIAGNOSIS — M79605 Pain in left leg: Secondary | ICD-10-CM

## 2017-03-05 DIAGNOSIS — M79604 Pain in right leg: Secondary | ICD-10-CM | POA: Diagnosis not present

## 2017-03-05 DIAGNOSIS — L03119 Cellulitis of unspecified part of limb: Secondary | ICD-10-CM | POA: Diagnosis not present

## 2017-03-05 MED ORDER — AMOXICILLIN-POT CLAVULANATE 875-125 MG PO TABS: 1.0000 | ORAL_TABLET | Freq: Two times a day (BID) | ORAL | 0 refills | Status: DC

## 2017-03-05 MED ORDER — TRAMADOL HCL 50 MG PO TABS: ORAL_TABLET | ORAL | 0 refills | Status: DC

## 2017-03-05 NOTE — Telephone Encounter (Signed)
Faxed hardcopy for Tramadol to [facility] [address] [place]

## 2017-03-05 NOTE — Telephone Encounter (Signed)
Called [facility] and canceled refill Called the patient on both cell/home number to clarify if we need to [facility] [place] or like message stated hold hardcopy for her to take to [place]?? Hardcopy on my desk for now until I get further clarification from this patient.

## 2017-03-05 NOTE — Progress Notes (Signed)
Pre visit review using our clinic tool,if applicable. No additional management support is needed unless otherwise documented below in the visit note.

## 2017-03-05 NOTE — Telephone Encounter (Signed)
Requesting:   Tramadol Contract    Signed on 01/12/2015 UDS   Moderate 12/24/15 Last OV    02/22/17-----no future refills Last Refill     #120 no refills on 01/19/2017 Please Advise

## 2017-03-05 NOTE — Telephone Encounter (Signed)
Patient Name: Margaret Dyer  DOB: [birth year]    Initial Comment caller states she has pain and swelling in her legs, has a red place on her right leg that looks infected   Nurse Assessment  Nurse: Leilani Merl, RN, Heather Date/Time (Eastern Time): 03/05/2017 9:55:12 AM  Confirm and document reason for call. If symptomatic, describe symptoms. ---caller states she has pain and swelling in her legs, has a red place on her right leg that looks infected. caller states that she fell 2 weeks ago and hurt both of them  Does the patient have any new or worsening symptoms? ---Yes  Will a triage be completed? ---Yes  Related visit to physician within the last 2 weeks? ---No  Does the PT have any chronic conditions? (i.e. diabetes, asthma, etc.) ---Yes  List chronic conditions. ---See MR  Is this a behavioral health or substance abuse call? ---No     Guidelines    Guideline Title Affirmed Question Affirmed Notes  Leg Injury [1] Large swelling or bruise AND [2] size > palm of person's hand    Final Disposition User   See Physician within 24 Hours Standifer, RN, Water quality scientist    Referrals  REFERRED TO PCP OFFICE   Disagree/Comply: Comply

## 2017-03-05 NOTE — Progress Notes (Addendum)
[place] at [facility] [address] [place], [place] [address] [phone]  Date:  03/05/2017   Name:  Margaret Dyer   DOB:  Jul 09, 1935   MRN:  [id number]  PCP:  Ann Held, DO    Chief Complaint: Leg Injury (Pain from fall in left and Right legs) and Medication Refill (Tramadol Written Rx)   History of Present Illness:  Margaret Dyer is a 82 y.o. very pleasant female patient who presents with the following:  History of HTN, hyperlipidemia, nephrectomy, DM Here today to discuss a concern about her bilateral legs  She was seen here on 3/22- at that time she had fallen 2 days prior and contused her left leg.  We started her on 10 days of doxycycline.  She is just now finishing this up Then a couple of days after her last visit she scraped her right shin on "a metal box" as well  Notes that her DM is not well controlled- managed by Dr. Ronnald Ramp at [facility]  No fever noted, no other systemic symptoms However she is concerned about increasing tenderness in both legs, as well as some redness and swelling.  She is concerned about a DVT  Tetanus is UTD No other falls  Confirmed that her "allergy" to abx is actually only nausea and not a rash or other allergic reaction  Lab Results  Component Value Date   HGBA1C 11.3 12/02/2015     Patient Active Problem List   Diagnosis Date Noted  . URI, acute 02/22/2017  . Abrasion, left knee, initial encounter 02/22/2017  . CTS (carpal tunnel syndrome) 03/04/2016  . Bilateral contusion of ribs 10/05/2015  . Dental abscess 04/15/2014  . Claudication, intermittent ([place]) 12/05/2013  . Obesity (BMI 30-39.9) 07/22/2013  . Back pain 05/26/2013  . Platelets decreased ([place]) 05/26/2013  . Breast pain, right 04/21/2013  . Anxiety and depression 12/05/2012  . Bronchitis 11/06/2012  . HEMATURIA UNSPECIFIED 02/17/2011  . Hyperlipidemia 09/08/2010  . B12 DEFICIENCY 07/22/2010  .  DIZZINESS 07/04/2010  . Essential hypertension 06/21/2010  . GOUT, UNSPECIFIED 01/27/2010  . NEPHRECTOMY, HX OF 12/02/2009  . NECK PAIN, LEFT 09/04/2008  . PARESTHESIA 09/02/2007  . GOITER NOS 04/23/2007  . DIABETES MELLITUS, TYPE II 04/23/2007  . Asthma 04/23/2007  . CALCULUS, KIDNEY 04/23/2007  . OSTEOPENIA 04/23/2007  . CARCINOMA, RENAL CELL 08/10/1999    Past Medical History:  Diagnosis Date  . Asthma   . Blood transfusion   . Diabetes mellitus   . Goiter   . Hyperlipidemia   . Hypertension   . Osteopenia   . Renal cell carcinoma     Past Surgical History:  Procedure Laterality Date  . ABDOMINAL HYSTERECTOMY    . APPENDECTOMY    . HEMORRHOID SURGERY    . KNEE SURGERY    . NEPHRECTOMY  09.2000    Social History  Substance Use Topics  . Smoking status: Former Research scientist (life sciences)  . Smokeless tobacco: Never Used     Comment: never used tobacco  . Alcohol use No    Family History  Problem Relation Age of Onset  . Cancer Sister     bladder  . Kidney cancer Father   . Cancer Father     renal  . Coronary artery disease    . Diabetes    . Hyperlipidemia    . Hypertension      Allergies  Allergen Reactions  . Cefuroxime Axetil  Upset stomach   . Ciprofloxacin     Upset stomach    Medication list has been reviewed and updated.  Current Outpatient Prescriptions on File Prior to Visit  Medication Sig Dispense Refill  . albuterol (PROVENTIL) (2.5 MG/3ML) 0.083% nebulizer solution Take 3 mLs (2.5 mg total) by nebulization every 6 (six) hours as needed for wheezing or shortness of breath. 75 mL 5  . alendronate (FOSAMAX) 70 MG tablet Take 1 tablet (70 mg total) by mouth every 7 (seven) days. Take with a full glass of water on an empty stomach. (Patient not taking: Reported on 02/22/2017) 4 tablet 11  . amLODipine (NORVASC) 10 MG tablet take 1 tablet by mouth once daily 30 tablet 4  . doxycycline (VIBRA-TABS) 100 MG tablet Take 1 tablet (100 mg total) by mouth 2 (two)  times daily. 20 tablet 0  . ezetimibe-simvastatin (VYTORIN) 10-20 MG per tablet Take 1 tablet by mouth at bedtime.    . furosemide (LASIX) 20 MG tablet Take 1 tablet (20 mg total) by mouth daily. 30 tablet 2  . gabapentin (NEURONTIN) 100 MG capsule take 1 capsule by mouth twice a day 60 capsule 5  . insulin glargine (LANTUS) 100 UNIT/ML injection Inject 55 Units into the skin every evening.     . insulin glulisine (APIDRA) 100 UNIT/ML injection 40 u sq in am and 80 u sq q pm 10 mL 11  . levothyroxine (SYNTHROID, LEVOTHROID) 25 MCG tablet take 1 tablet by mouth once daily 30 tablet 5  . LORazepam (ATIVAN) 0.5 MG tablet take 1 tablet by mouth twice a day if needed 60 tablet 0  . losartan (COZAAR) 100 MG tablet take 1 tablet by mouth once daily 30 tablet 1  . metoprolol tartrate (LOPRESSOR) 25 MG tablet take 1 tablet by mouth twice a day 180 tablet 0  . oxybutynin (DITROPAN) 5 MG tablet Take 5 mg by mouth 2 (two) times daily.  0  . promethazine-dextromethorphan (PROMETHAZINE-DM) 6.25-15 MG/5ML syrup Take 5 mLs by mouth 4 (four) times daily as needed for cough. 118 mL 0  . traMADol (ULTRAM) 50 MG tablet TAKE 2 TABLETS BY MOUTH TWICE A DAY AS NEEDED 120 tablet 0   No current facility-administered medications on file prior to visit.     Review of Systems:  As per HPI- otherwise negative.  BP Readings from Last 3 Encounters:  03/05/17 (!) 144/55  02/22/17 (!) 118/52  10/24/16 (!) 142/80      Physical Examination: Vitals:   03/05/17 1439  BP: (!) 144/55  Pulse: 79  Temp: 98.1 F (36.7 C)   Vitals:   03/05/17 1439  Weight: 206 lb 6.4 oz (93.6 kg)  Height: _0  (1.6 m)   Body mass index is 36.56 kg/m. Ideal Body Weight: Weight in (lb) to have BMI = 25: 140.8  GEN: WDWN, NAD, Non-toxic, A & O x 3, elderly but appears well HEENT: Atraumatic, Normocephalic. Neck supple. No masses, No LAD. Ears and Nose: No external deformity. CV: RRR, No M/G/R. No JVD. No thrill. No extra heart  sounds. PULM: CTA B, no wheezes, crackles, rhonchi. No retractions. No resp. distress. No accessory muscle use. ABD: S, NT, ND, +BS. No rebound. No HSM. EXTR: No c/c Both legs display warmth and mild edema of the amteropr shins with associated abrasions from fall/ injury.  She may have an element of vasculitis.  Feet are normal and well perfused.  No calf swelling or cords NEURO Normal gait.  PSYCH: Normally  interactive. Conversant. Not depressed or anxious appearing.  Calm demeanor.    Assessment and Plan: Cellulitis of lower leg - Plan: CBC, Comprehensive metabolic panel, Sedimentation rate, amoxicillin-clavulanate (AUGMENTIN) 875-125 MG tablet, DISCONTINUED: amoxicillin-clavulanate (AUGMENTIN) 875-125 MG tablet  Leg pain, bilateral - Plan: traMADol (ULTRAM) 50 MG tablet, US Venous Img Lower Bilateral  Here today with concern of persistent cellulitis vs vasculitis of her bilateral legs.  No urgent findings but she will need close follow-up Scheduled her to see me on 4/5 for a recheck visit Start on augmentin Labs and [place] pending- have asked for [place] asap She is to let me know if any worsening in the meantime   Signed Lamar Blinks, MD  Received her [place] report 4/6- negative for DVT.  Called and let her know

## 2017-03-05 NOTE — Telephone Encounter (Signed)
This request was sent to the wrong pharmacy. The request came from [id number] [place] Pt called stating she wants to get the written prescription when she comes for appt. [facility] [employer]

## 2017-03-05 NOTE — Telephone Encounter (Signed)
  03/01/2017 09:51 AM Phone (Outgoing) Margaret Dyer, Margaret Dyer (Self) [phone] (W)   Completed - Called pt and spoke with her..She voiced that she was on the way to an appt and would CB later this afternoon.     By Verdene Rio

## 2017-03-05 NOTE — Telephone Encounter (Signed)
Called the patient and she was seen by Dr. Lorelei Pont today and she got a hardcopy that she will take by [facility] to see if less expensive. I did shred the hardcopy [facility]ed to [facility]/then canceled.

## 2017-03-05 NOTE — Patient Instructions (Addendum)
We will set up ultrasound examinations of your legs to make sure you do not have a blood clot. I have asked them to get this arranged by tomorrow.  We are going to get some labs for you today We are also going to try a course of augmentin for your legs  Please see me on Thursday to check on how you are doing- let me know sooner if you are worse

## 2017-03-06 ENCOUNTER — Ambulatory Visit (HOSPITAL_BASED_OUTPATIENT_CLINIC_OR_DEPARTMENT_OTHER): Payer: Medicare Other

## 2017-03-06 ENCOUNTER — Telehealth: Payer: Self-pay | Admitting: Family Medicine

## 2017-03-06 NOTE — Telephone Encounter (Signed)
-----  Message from Katha Hamming sent at 03/06/2017 12:38 PM EDT ----- Regarding: venous ultrasound Margaret Dyer cancelled her ultrasound for yesterday and then cancelled her  2:30 pm appointment today stating that her husband was in hospital now.  Thanks, Hoyle Sauer

## 2017-03-07 ENCOUNTER — Other Ambulatory Visit (HOSPITAL_BASED_OUTPATIENT_CLINIC_OR_DEPARTMENT_OTHER): Payer: Medicare Other

## 2017-03-08 ENCOUNTER — Ambulatory Visit: Payer: Medicare Other | Admitting: Family Medicine

## 2017-03-08 ENCOUNTER — Ambulatory Visit (HOSPITAL_BASED_OUTPATIENT_CLINIC_OR_DEPARTMENT_OTHER)
Admission: RE | Admit: 2017-03-08 | Discharge: 2017-03-08 | Disposition: A | Discharge status: Home / Self Care | Payer: Medicare Other | Source: Ambulatory Visit | Attending: Family Medicine | Admitting: Family Medicine

## 2017-03-08 DIAGNOSIS — M79604 Pain in right leg: Secondary | ICD-10-CM

## 2017-03-08 DIAGNOSIS — R2243 Localized swelling, mass and lump, lower limb, bilateral: Secondary | ICD-10-CM | POA: Diagnosis not present

## 2017-03-08 DIAGNOSIS — M79605 Pain in left leg: Secondary | ICD-10-CM | POA: Diagnosis not present

## 2017-03-08 DIAGNOSIS — S8992XA Unspecified injury of left lower leg, initial encounter: Secondary | ICD-10-CM | POA: Diagnosis not present

## 2017-03-19 ENCOUNTER — Telehealth: Payer: Self-pay | Admitting: Family Medicine

## 2017-03-21 ENCOUNTER — Other Ambulatory Visit (HOSPITAL_COMMUNITY): Payer: Medicare Other

## 2017-03-29 ENCOUNTER — Telehealth: Payer: Self-pay | Admitting: Family Medicine

## 2017-03-29 ENCOUNTER — Other Ambulatory Visit: Payer: Self-pay | Admitting: Family Medicine

## 2017-03-29 NOTE — Telephone Encounter (Signed)
Faxed hardcopy to [place] Patient notified.

## 2017-03-29 NOTE — Telephone Encounter (Signed)
Pt wants prescription to be sent [facility], pt would like to know if she can get her meds for today since she is not feeling good at all if possible.

## 2017-03-29 NOTE — Telephone Encounter (Signed)
Have sent to PCP already

## 2017-03-29 NOTE — Telephone Encounter (Signed)
Pt called in to follow up on refill. Pt says that her spouse died a few weeks ago and she need medication desperately. She would like to know if Rx could be sent to pharmacy as soon as possible.     [facility]acy: [facility] [place], [place]

## 2017-03-29 NOTE — Telephone Encounter (Signed)
Requesting:   Lorazepam Contract    01/12/2015 UDS    Low risk next is due on 01/31/2017 Last OV     02/22/2017 Last Refill    #60 with 0 refills on 02/22/2017  Please Advise

## 2017-03-29 NOTE — Telephone Encounter (Signed)
No med on here

## 2017-04-02 NOTE — Telephone Encounter (Signed)
Faxed in on 03/29/17 Is documented

## 2017-04-16 ENCOUNTER — Other Ambulatory Visit: Payer: Self-pay | Admitting: Family Medicine

## 2017-04-16 DIAGNOSIS — M79605 Pain in left leg: Principal | ICD-10-CM

## 2017-04-16 DIAGNOSIS — M79604 Pain in right leg: Secondary | ICD-10-CM

## 2017-04-16 NOTE — Telephone Encounter (Signed)
Patient called requesting Tramadol 55m (take 2 tabs po BID prn) #120, 0RF  Last RF: 03/05/2017 Last OV: 02/22/2017 Next OV: 05/25/2017 UDS: 01/12/15 controlled substance contract signed, uds sample given, Low risk, next screen 07/13/15.  03/23/15 uds sample given, high risk per doctor LEtter Sjogrenwill not further prescribe controlled substance drugs anymore. 09/23/15 uds sample given,moderate risk,, next screen 12/24/15.  Forwarded to Provider for review, approval or denial.

## 2017-04-16 NOTE — Telephone Encounter (Signed)
Hard copy faxed to [facility].

## 2017-04-22 ENCOUNTER — Other Ambulatory Visit: Payer: Self-pay | Admitting: Family Medicine

## 2017-05-01 ENCOUNTER — Other Ambulatory Visit: Payer: Self-pay | Admitting: Family Medicine

## 2017-05-01 MED ORDER — LORAZEPAM 0.5 MG PO TABS: ORAL_TABLET | ORAL | 0 refills | Status: DC

## 2017-05-01 NOTE — Telephone Encounter (Signed)
Patient called stating she is having a bad day due to the recent death of her husband and she would like to pick up medication today if possible.

## 2017-05-01 NOTE — Telephone Encounter (Signed)
Received refill request for Ativan 0.53m tab (take 1 tab po BID prn) #60, 0RF  Last RF: 03/29/2017 Last OV: 02/22/2017 Projected upcoming appointment is around 05/25/2017 UDS: 01/12/15 controlled substance contract signed, uds sample given, Low risk, next screen 07/13/15.  03/23/15 uds sample given, high risk per doctor LEtter Sjogrenwill not further prescribe controlled substance drugs anymore. 09/23/15 uds sample given,moderate risk,, next screen 12/24/15.   Forwarded to the Provider for review, approval or denial.

## 2017-05-01 NOTE — Addendum Note (Signed)
Addended by: Sharen Counter D on: 05/01/2017 06:58 PM   Modules accepted: Orders

## 2017-05-02 NOTE — Telephone Encounter (Signed)
rx faxed

## 2017-05-03 ENCOUNTER — Encounter: Payer: Self-pay | Admitting: Family Medicine

## 2017-05-07 ENCOUNTER — Emergency Department (HOSPITAL_BASED_OUTPATIENT_CLINIC_OR_DEPARTMENT_OTHER)
Admission: EM | Admit: 2017-05-07 | Discharge: 2017-05-08 | Disposition: A | Discharge status: Home / Self Care | Payer: Medicare Other | Attending: Emergency Medicine | Admitting: Emergency Medicine

## 2017-05-07 ENCOUNTER — Encounter (HOSPITAL_BASED_OUTPATIENT_CLINIC_OR_DEPARTMENT_OTHER): Payer: Self-pay | Admitting: Respiratory Therapy

## 2017-05-07 DIAGNOSIS — Z79899 Other long term (current) drug therapy: Secondary | ICD-10-CM | POA: Insufficient documentation

## 2017-05-07 DIAGNOSIS — Z87891 Personal history of nicotine dependence: Secondary | ICD-10-CM | POA: Diagnosis not present

## 2017-05-07 DIAGNOSIS — Z794 Long term (current) use of insulin: Secondary | ICD-10-CM | POA: Insufficient documentation

## 2017-05-07 DIAGNOSIS — J45909 Unspecified asthma, uncomplicated: Secondary | ICD-10-CM | POA: Diagnosis not present

## 2017-05-07 DIAGNOSIS — S0083XA Contusion of other part of head, initial encounter: Secondary | ICD-10-CM | POA: Diagnosis not present

## 2017-05-07 DIAGNOSIS — Y929 Unspecified place or not applicable: Secondary | ICD-10-CM | POA: Diagnosis not present

## 2017-05-07 DIAGNOSIS — Y939 Activity, unspecified: Secondary | ICD-10-CM | POA: Insufficient documentation

## 2017-05-07 DIAGNOSIS — Y999 Unspecified external cause status: Secondary | ICD-10-CM | POA: Diagnosis not present

## 2017-05-07 DIAGNOSIS — E119 Type 2 diabetes mellitus without complications: Secondary | ICD-10-CM | POA: Diagnosis not present

## 2017-05-07 DIAGNOSIS — S0990XA Unspecified injury of head, initial encounter: Secondary | ICD-10-CM | POA: Diagnosis present

## 2017-05-07 DIAGNOSIS — I10 Essential (primary) hypertension: Secondary | ICD-10-CM | POA: Diagnosis not present
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
## 2017-05-08 ENCOUNTER — Ambulatory Visit
Admission: RE | Admit: 2017-05-08 | Discharge: 2017-05-08 | Disposition: A | Discharge status: Home / Self Care | Payer: Medicare Other | Source: Ambulatory Visit | Attending: Emergency Medicine | Admitting: Emergency Medicine

## 2017-05-08 ENCOUNTER — Other Ambulatory Visit: Payer: Self-pay | Admitting: Emergency Medicine

## 2017-05-08 DIAGNOSIS — S0990XA Unspecified injury of head, initial encounter: Secondary | ICD-10-CM

## 2017-05-08 DIAGNOSIS — M7989 Other specified soft tissue disorders: Secondary | ICD-10-CM | POA: Diagnosis not present

## 2017-05-08 NOTE — ED Notes (Signed)
ED Provider at bedside. 

## 2017-05-10 ENCOUNTER — Telehealth: Payer: Self-pay | Admitting: Family Medicine

## 2017-05-10 NOTE — Telephone Encounter (Signed)
° °  Relation to pt: self Call back number: [phone] (H) and [phone]   Reason for call:  Patient inquiring about imaging results, please advise

## 2017-05-10 NOTE — Telephone Encounter (Signed)
Patient was seen in the ED on 05/07/17 and CT was conducted, patient states she has not received any calls regarding results and was inquiring who she may call to receive results,please advise

## 2017-05-10 NOTE — Telephone Encounter (Signed)
Pt called in again, per Dr. Etter Sjogren the ordering provider will contact her with results (from the ER).

## 2017-05-10 NOTE — Telephone Encounter (Signed)
?  We did not order any imaging

## 2017-05-11 NOTE — Telephone Encounter (Signed)
Patient informed PCP did not order test and she needs to contact ED to have ordering MD to provider her with results.

## 2017-05-11 NOTE — ED Notes (Signed)
Pt. Called for CT scan , Spoke with Dr. Jeanell Sparrow Results given to pt.
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
## 2017-05-12 ENCOUNTER — Emergency Department (HOSPITAL_COMMUNITY): Payer: Medicare Other

## 2017-05-12 ENCOUNTER — Encounter (HOSPITAL_COMMUNITY): Payer: Self-pay | Admitting: Emergency Medicine

## 2017-05-12 DIAGNOSIS — I5033 Acute on chronic diastolic (congestive) heart failure: Secondary | ICD-10-CM | POA: Diagnosis not present

## 2017-05-12 DIAGNOSIS — Y95 Nosocomial condition: Secondary | ICD-10-CM | POA: Diagnosis present

## 2017-05-12 DIAGNOSIS — J180 Bronchopneumonia, unspecified organism: Secondary | ICD-10-CM | POA: Diagnosis present

## 2017-05-12 DIAGNOSIS — E039 Hypothyroidism, unspecified: Secondary | ICD-10-CM | POA: Diagnosis present

## 2017-05-12 DIAGNOSIS — Z794 Long term (current) use of insulin: Secondary | ICD-10-CM

## 2017-05-12 DIAGNOSIS — F419 Anxiety disorder, unspecified: Secondary | ICD-10-CM | POA: Diagnosis present

## 2017-05-12 DIAGNOSIS — J9621 Acute and chronic respiratory failure with hypoxia: Secondary | ICD-10-CM | POA: Diagnosis not present

## 2017-05-12 DIAGNOSIS — R079 Chest pain, unspecified: Secondary | ICD-10-CM | POA: Diagnosis not present

## 2017-05-12 DIAGNOSIS — K7581 Nonalcoholic steatohepatitis (NASH): Secondary | ICD-10-CM | POA: Diagnosis present

## 2017-05-12 DIAGNOSIS — E785 Hyperlipidemia, unspecified: Secondary | ICD-10-CM | POA: Diagnosis present

## 2017-05-12 DIAGNOSIS — Z905 Acquired absence of kidney: Secondary | ICD-10-CM

## 2017-05-12 DIAGNOSIS — E1165 Type 2 diabetes mellitus with hyperglycemia: Secondary | ICD-10-CM | POA: Diagnosis present

## 2017-05-12 DIAGNOSIS — I130 Hypertensive heart and chronic kidney disease with heart failure and stage 1 through stage 4 chronic kidney disease, or unspecified chronic kidney disease: Secondary | ICD-10-CM | POA: Diagnosis present

## 2017-05-12 DIAGNOSIS — I2729 Other secondary pulmonary hypertension: Secondary | ICD-10-CM | POA: Diagnosis present

## 2017-05-12 DIAGNOSIS — N3281 Overactive bladder: Secondary | ICD-10-CM | POA: Diagnosis present

## 2017-05-12 DIAGNOSIS — N182 Chronic kidney disease, stage 2 (mild): Secondary | ICD-10-CM | POA: Diagnosis present

## 2017-05-12 DIAGNOSIS — Z87891 Personal history of nicotine dependence: Secondary | ICD-10-CM

## 2017-05-12 DIAGNOSIS — N179 Acute kidney failure, unspecified: Secondary | ICD-10-CM | POA: Diagnosis present

## 2017-05-12 DIAGNOSIS — Z79899 Other long term (current) drug therapy: Secondary | ICD-10-CM

## 2017-05-12 DIAGNOSIS — R0602 Shortness of breath: Secondary | ICD-10-CM | POA: Diagnosis not present

## 2017-05-12 DIAGNOSIS — E1122 Type 2 diabetes mellitus with diabetic chronic kidney disease: Secondary | ICD-10-CM | POA: Diagnosis present

## 2017-05-12 DIAGNOSIS — K7460 Unspecified cirrhosis of liver: Secondary | ICD-10-CM | POA: Diagnosis present

## 2017-05-12 DIAGNOSIS — R0609 Other forms of dyspnea: Secondary | ICD-10-CM | POA: Diagnosis not present

## 2017-05-12 DIAGNOSIS — I5082 Biventricular heart failure: Secondary | ICD-10-CM | POA: Diagnosis present

## 2017-05-12 LAB — BASIC METABOLIC PANEL
ANION GAP: 8 (ref 5–15)
BUN: 22 mg/dL — ABNORMAL HIGH (ref 6–20)
CO2: 22 mmol/L (ref 22–32)
Calcium: 8.8 mg/dL — ABNORMAL LOW (ref 8.9–10.3)
Chloride: 106 mmol/L (ref 101–111)
Creatinine, Ser: 1.36 mg/dL — ABNORMAL HIGH (ref 0.44–1.00)
GFR calc Af Amer: 42 mL/min — ABNORMAL LOW (ref >60)
GFR, EST NON AFRICAN AMERICAN: 36 mL/min — AB (ref >60)
GLUCOSE: 170 mg/dL — AB (ref 65–99)
POTASSIUM: 4 mmol/L (ref 3.5–5.1)
Sodium: 136 mmol/L (ref 135–145)

## 2017-05-12 LAB — I-STAT TROPONIN, ED: Troponin i, poc: 0 ng/mL (ref 0.00–0.08)

## 2017-05-12 LAB — CBC
HEMATOCRIT: 45 % (ref 36.0–46.0)
HEMOGLOBIN: 14.6 g/dL (ref 12.0–15.0)
MCH: 29.6 pg (ref 26.0–34.0)
MCHC: 32.4 g/dL (ref 30.0–36.0)
MCV: 91.3 fL (ref 78.0–100.0)
Platelets: 120 10*3/uL — ABNORMAL LOW (ref 150–400)
RBC: 4.93 MIL/uL (ref 3.87–5.11)
RDW: 13.9 % (ref 11.5–15.5)
WBC: 6.7 10*3/uL (ref 4.0–10.5)

## 2017-05-12 NOTE — ED Notes (Signed)
Call received from daughter.  Yelling that her mother should go back right now.  Tried to explain that we had a long wait and blood work and appropriate tests have been ordered in triage and we would be monitoring the patient until we could get her back to a room.  I reviewed lab work previously.  No critical results noted at this time.  Patient sitting quietly in the waiting area with no distress noted or complaints voiced at this time.  Charge nurse Lilia Pro notified of call.

## 2017-05-12 NOTE — ED Triage Notes (Signed)
Pt having 7/10 right shoulder pain going to her right shoulder, very SOB with minimum activity. Denies nausea, vomiting.

## 2017-05-13 ENCOUNTER — Inpatient Hospital Stay (HOSPITAL_COMMUNITY)
Admission: EM | Admit: 2017-05-13 | Discharge: 2017-05-24 | DRG: 286 | Disposition: A | Discharge status: Home / Self Care | Payer: Medicare Other | Attending: Internal Medicine | Admitting: Internal Medicine

## 2017-05-13 ENCOUNTER — Emergency Department (HOSPITAL_COMMUNITY): Payer: Medicare Other

## 2017-05-13 ENCOUNTER — Encounter (HOSPITAL_COMMUNITY): Payer: Self-pay

## 2017-05-13 ENCOUNTER — Observation Stay (HOSPITAL_BASED_OUTPATIENT_CLINIC_OR_DEPARTMENT_OTHER): Payer: Medicare Other

## 2017-05-13 ENCOUNTER — Other Ambulatory Visit: Payer: Self-pay

## 2017-05-13 DIAGNOSIS — R0600 Dyspnea, unspecified: Secondary | ICD-10-CM | POA: Diagnosis not present

## 2017-05-13 DIAGNOSIS — I5032 Chronic diastolic (congestive) heart failure: Secondary | ICD-10-CM | POA: Diagnosis present

## 2017-05-13 DIAGNOSIS — J9610 Chronic respiratory failure, unspecified whether with hypoxia or hypercapnia: Secondary | ICD-10-CM | POA: Diagnosis present

## 2017-05-13 DIAGNOSIS — R0609 Other forms of dyspnea: Secondary | ICD-10-CM | POA: Diagnosis not present

## 2017-05-13 DIAGNOSIS — D696 Thrombocytopenia, unspecified: Secondary | ICD-10-CM | POA: Diagnosis present

## 2017-05-13 DIAGNOSIS — J189 Pneumonia, unspecified organism: Secondary | ICD-10-CM | POA: Diagnosis present

## 2017-05-13 DIAGNOSIS — F419 Anxiety disorder, unspecified: Secondary | ICD-10-CM | POA: Diagnosis present

## 2017-05-13 DIAGNOSIS — J45909 Unspecified asthma, uncomplicated: Secondary | ICD-10-CM | POA: Diagnosis present

## 2017-05-13 DIAGNOSIS — R0602 Shortness of breath: Secondary | ICD-10-CM | POA: Diagnosis not present

## 2017-05-13 DIAGNOSIS — R0902 Hypoxemia: Secondary | ICD-10-CM

## 2017-05-13 DIAGNOSIS — E039 Hypothyroidism, unspecified: Secondary | ICD-10-CM | POA: Diagnosis present

## 2017-05-13 DIAGNOSIS — E785 Hyperlipidemia, unspecified: Secondary | ICD-10-CM | POA: Diagnosis present

## 2017-05-13 DIAGNOSIS — Z794 Long term (current) use of insulin: Secondary | ICD-10-CM

## 2017-05-13 DIAGNOSIS — Z905 Acquired absence of kidney: Secondary | ICD-10-CM

## 2017-05-13 DIAGNOSIS — I50810 Right heart failure, unspecified: Secondary | ICD-10-CM | POA: Diagnosis present

## 2017-05-13 DIAGNOSIS — M1990 Unspecified osteoarthritis, unspecified site: Secondary | ICD-10-CM | POA: Diagnosis present

## 2017-05-13 DIAGNOSIS — N3281 Overactive bladder: Secondary | ICD-10-CM | POA: Diagnosis present

## 2017-05-13 DIAGNOSIS — N179 Acute kidney failure, unspecified: Secondary | ICD-10-CM | POA: Diagnosis present

## 2017-05-13 DIAGNOSIS — C649 Malignant neoplasm of unspecified kidney, except renal pelvis: Secondary | ICD-10-CM | POA: Diagnosis present

## 2017-05-13 DIAGNOSIS — J9611 Chronic respiratory failure with hypoxia: Secondary | ICD-10-CM | POA: Diagnosis present

## 2017-05-13 DIAGNOSIS — K7581 Nonalcoholic steatohepatitis (NASH): Secondary | ICD-10-CM

## 2017-05-13 DIAGNOSIS — R0989 Other specified symptoms and signs involving the circulatory and respiratory systems: Secondary | ICD-10-CM

## 2017-05-13 DIAGNOSIS — J9621 Acute and chronic respiratory failure with hypoxia: Secondary | ICD-10-CM

## 2017-05-13 DIAGNOSIS — I5031 Acute diastolic (congestive) heart failure: Secondary | ICD-10-CM | POA: Diagnosis present

## 2017-05-13 DIAGNOSIS — K7460 Unspecified cirrhosis of liver: Secondary | ICD-10-CM | POA: Diagnosis present

## 2017-05-13 DIAGNOSIS — D731 Hypersplenism: Secondary | ICD-10-CM | POA: Diagnosis present

## 2017-05-13 DIAGNOSIS — I10 Essential (primary) hypertension: Secondary | ICD-10-CM | POA: Diagnosis present

## 2017-05-13 DIAGNOSIS — E119 Type 2 diabetes mellitus without complications: Secondary | ICD-10-CM

## 2017-05-13 LAB — TROPONIN I
Troponin I: <0.03 ng/mL (ref <0.03)
Troponin I: <0.03 ng/mL (ref <0.03)
Troponin I: <0.03 ng/mL (ref <0.03)
Troponin I: <0.03 ng/mL (ref <0.03)

## 2017-05-13 LAB — ECHOCARDIOGRAM COMPLETE
AVLVOTPG: 7 mmHg
CHL CUP MV DEC (S): 229
E decel time: 229 msec
E/e' ratio: 16.16
FS: 44 % (ref 28–44)
Height: 60.5 in
IV/PV OW: 1.03
LA ID, A-P, ES: 40 mm
LA diam end sys: 40 mm
LA vol index: 28.2 mL/m2
LADIAMINDEX: 1.98 cm/m2
LAVOL: 56.8 mL
LAVOLA4C: 76.5 mL
LDCA: 3.14 cm2
LV E/e' medial: 16.16
LV E/e'average: 16.16
LV SIMPSON'S DISK: 66
LV TDI E'MEDIAL: 4.9
LV e' LATERAL: 6.31 cm/s
LV sys vol index: 8 mL/m2
LVDIAVOL: 48 mL (ref 46–106)
LVDIAVOLIN: 24 mL/m2
LVOT SV: 91 mL
LVOT VTI: 29 cm
LVOTD: 20 mm
LVOTPV: 132 cm/s
LVSYSVOL: 16 mL
MV Peak grad: 4 mmHg
MV pk E vel: 102 m/s
MVPKAVEL: 87.3 m/s
PW: 10.7 mm — AB (ref 0.6–1.1)
RV LATERAL S' VELOCITY: 18.8 cm/s
RV TAPSE: 22.6 mm
Stroke v: 32 ml
TDI e' lateral: 6.31

## 2017-05-13 LAB — BRAIN NATRIURETIC PEPTIDE: B NATRIURETIC PEPTIDE 5: 219.3 pg/mL — AB (ref 0.0–100.0)

## 2017-05-13 LAB — GLUCOSE, CAPILLARY
GLUCOSE-CAPILLARY: 306 mg/dL — AB (ref 65–99)
GLUCOSE-CAPILLARY: 305 mg/dL — AB (ref 65–99)
Glucose-Capillary: 388 mg/dL — ABNORMAL HIGH (ref 65–99)
Glucose-Capillary: 363 mg/dL — ABNORMAL HIGH (ref 65–99)

## 2017-05-13 LAB — PHOSPHORUS: Phosphorus: 3.5 mg/dL (ref 2.5–4.6)

## 2017-05-13 LAB — HEPATIC FUNCTION PANEL
ALBUMIN: 3.1 g/dL — AB (ref 3.5–5.0)
ALK PHOS: 72 U/L (ref 38–126)
ALT: 37 U/L (ref 14–54)
AST: 37 U/L (ref 15–41)
Bilirubin, Direct: 0.2 mg/dL (ref 0.1–0.5)
Indirect Bilirubin: 0.6 mg/dL (ref 0.3–0.9)
TOTAL PROTEIN: 5.9 g/dL — AB (ref 6.5–8.1)
Total Bilirubin: 0.8 mg/dL (ref 0.3–1.2)

## 2017-05-13 LAB — D-DIMER, QUANTITATIVE: D-Dimer, Quant: 0.81 ug/mL-FEU — ABNORMAL HIGH (ref 0.00–0.50)

## 2017-05-13 LAB — MAGNESIUM: MAGNESIUM: 1.8 mg/dL (ref 1.7–2.4)

## 2017-05-13 LAB — TSH: TSH: 1.328 u[IU]/mL (ref 0.350–4.500)

## 2017-05-13 MED ORDER — LEVOTHYROXINE SODIUM 25 MCG PO TABS
25.0000 ug | ORAL_TABLET | Freq: Every day | ORAL | Status: DC
  Administered 2017-05-14 – 2017-05-24 (×11): 25 ug via ORAL
  Filled 2017-05-13 (×12): qty 1

## 2017-05-13 MED ORDER — INSULIN ASPART 100 UNIT/ML ~~LOC~~ SOLN
40.0000 [IU] | Freq: Every day | SUBCUTANEOUS | Status: DC
  Administered 2017-05-13 – 2017-05-15 (×3): 40 [IU] via SUBCUTANEOUS

## 2017-05-13 MED ORDER — ASPIRIN 81 MG PO CHEW
324.0000 mg | CHEWABLE_TABLET | Freq: Once | ORAL | Status: AC
  Administered 2017-05-13: 324 mg via ORAL
  Filled 2017-05-13: qty 4

## 2017-05-13 MED ORDER — ACETAMINOPHEN 650 MG RE SUPP: 650.0000 mg | Freq: Four times a day (QID) | RECTAL | Status: DC | PRN

## 2017-05-13 MED ORDER — METHYLPREDNISOLONE SODIUM SUCC 125 MG IJ SOLR
125.0000 mg | Freq: Once | INTRAMUSCULAR | Status: AC
  Administered 2017-05-13: 125 mg via INTRAVENOUS
  Filled 2017-05-13: qty 2

## 2017-05-13 MED ORDER — ALBUTEROL SULFATE (2.5 MG/3ML) 0.083% IN NEBU
2.5000 mg | INHALATION_SOLUTION | Freq: Four times a day (QID) | RESPIRATORY_TRACT | Status: DC | PRN
  Administered 2017-05-14 – 2017-05-17 (×6): 2.5 mg via RESPIRATORY_TRACT
  Filled 2017-05-13 (×7): qty 3

## 2017-05-13 MED ORDER — SODIUM CHLORIDE 0.9 % IV BOLUS (SEPSIS)
500.0000 mL | Freq: Once | INTRAVENOUS | Status: AC
  Administered 2017-05-13: 500 mL via INTRAVENOUS

## 2017-05-13 MED ORDER — METHYLPREDNISOLONE SODIUM SUCC 125 MG IJ SOLR
60.0000 mg | Freq: Once | INTRAMUSCULAR | Status: AC
  Administered 2017-05-13: 60 mg via INTRAVENOUS
  Filled 2017-05-13: qty 2

## 2017-05-13 MED ORDER — SODIUM CHLORIDE 0.9 % IV SOLN
INTRAVENOUS | Status: DC
  Administered 2017-05-13 – 2017-05-14 (×2): via INTRAVENOUS

## 2017-05-13 MED ORDER — LORAZEPAM 0.5 MG PO TABS
0.5000 mg | ORAL_TABLET | Freq: Four times a day (QID) | ORAL | Status: DC | PRN
  Administered 2017-05-21: 0.5 mg via ORAL
  Filled 2017-05-13: qty 1

## 2017-05-13 MED ORDER — SODIUM CHLORIDE 0.9% FLUSH
3.0000 mL | Freq: Two times a day (BID) | INTRAVENOUS | Status: DC
  Administered 2017-05-13 – 2017-05-23 (×18): 3 mL via INTRAVENOUS

## 2017-05-13 MED ORDER — OXYBUTYNIN CHLORIDE 5 MG PO TABS
5.0000 mg | ORAL_TABLET | ORAL | Status: DC
  Administered 2017-05-14 – 2017-05-24 (×11): 5 mg via ORAL
  Filled 2017-05-13 (×12): qty 1

## 2017-05-13 MED ORDER — ONDANSETRON HCL 4 MG PO TABS
4.0000 mg | ORAL_TABLET | Freq: Four times a day (QID) | ORAL | Status: DC | PRN
  Administered 2017-05-17: 4 mg via ORAL
  Filled 2017-05-13: qty 1

## 2017-05-13 MED ORDER — AMLODIPINE BESYLATE 10 MG PO TABS
10.0000 mg | ORAL_TABLET | Freq: Every day | ORAL | Status: DC
  Administered 2017-05-13 – 2017-05-24 (×12): 10 mg via ORAL
  Filled 2017-05-13 (×12): qty 1

## 2017-05-13 MED ORDER — IPRATROPIUM-ALBUTEROL 0.5-2.5 (3) MG/3ML IN SOLN
3.0000 mL | Freq: Four times a day (QID) | RESPIRATORY_TRACT | Status: DC
  Administered 2017-05-13: 3 mL via RESPIRATORY_TRACT
  Filled 2017-05-13: qty 3

## 2017-05-13 MED ORDER — INSULIN GLARGINE 100 UNIT/ML ~~LOC~~ SOLN
60.0000 [IU] | Freq: Every evening | SUBCUTANEOUS | Status: DC
  Administered 2017-05-13: 60 [IU] via SUBCUTANEOUS
  Filled 2017-05-13 (×2): qty 0.6

## 2017-05-13 MED ORDER — TRAMADOL HCL 50 MG PO TABS
50.0000 mg | ORAL_TABLET | Freq: Four times a day (QID) | ORAL | Status: DC | PRN
  Administered 2017-05-15 – 2017-05-23 (×8): 50 mg via ORAL
  Filled 2017-05-13 (×8): qty 1

## 2017-05-13 MED ORDER — INSULIN ASPART 100 UNIT/ML ~~LOC~~ SOLN
0.0000 [IU] | Freq: Three times a day (TID) | SUBCUTANEOUS | Status: DC
  Administered 2017-05-13 (×2): 7 [IU] via SUBCUTANEOUS
  Administered 2017-05-14: 9 [IU] via SUBCUTANEOUS
  Administered 2017-05-14: 3 [IU] via SUBCUTANEOUS
  Administered 2017-05-14: 5 [IU] via SUBCUTANEOUS
  Administered 2017-05-15: 1 [IU] via SUBCUTANEOUS
  Administered 2017-05-15: 5 [IU] via SUBCUTANEOUS
  Administered 2017-05-16: 1 [IU] via SUBCUTANEOUS
  Administered 2017-05-16 – 2017-05-17 (×2): 5 [IU] via SUBCUTANEOUS
  Administered 2017-05-17: 1 [IU] via SUBCUTANEOUS
  Administered 2017-05-18: 2 [IU] via SUBCUTANEOUS
  Administered 2017-05-18: 3 [IU] via SUBCUTANEOUS
  Administered 2017-05-19: 2 [IU] via SUBCUTANEOUS
  Administered 2017-05-19: 1 [IU] via SUBCUTANEOUS
  Administered 2017-05-20: 5 [IU] via SUBCUTANEOUS
  Administered 2017-05-20: 1 [IU] via SUBCUTANEOUS
  Administered 2017-05-21: 2 [IU] via SUBCUTANEOUS
  Administered 2017-05-21: 1 [IU] via SUBCUTANEOUS
  Administered 2017-05-22 (×2): 2 [IU] via SUBCUTANEOUS
  Administered 2017-05-23 (×2): 1 [IU] via SUBCUTANEOUS
  Administered 2017-05-24: 3 [IU] via SUBCUTANEOUS

## 2017-05-13 MED ORDER — DOXYCYCLINE HYCLATE 100 MG PO TABS
100.0000 mg | ORAL_TABLET | Freq: Two times a day (BID) | ORAL | Status: DC
  Administered 2017-05-13 – 2017-05-19 (×14): 100 mg via ORAL
  Filled 2017-05-13 (×14): qty 1

## 2017-05-13 MED ORDER — IOPAMIDOL (ISOVUE-370) INJECTION 76%
INTRAVENOUS | Status: AC
  Administered 2017-05-13: 75 mL
  Filled 2017-05-13: qty 100

## 2017-05-13 MED ORDER — INSULIN ASPART 100 UNIT/ML ~~LOC~~ SOLN
0.0000 [IU] | Freq: Every day | SUBCUTANEOUS | Status: DC
  Administered 2017-05-13: 5 [IU] via SUBCUTANEOUS
  Administered 2017-05-15 – 2017-05-17 (×3): 3 [IU] via SUBCUTANEOUS
  Administered 2017-05-19 – 2017-05-22 (×2): 2 [IU] via SUBCUTANEOUS
  Administered 2017-05-23: 4 [IU] via SUBCUTANEOUS

## 2017-05-13 MED ORDER — GABAPENTIN 100 MG PO CAPS
100.0000 mg | ORAL_CAPSULE | Freq: Two times a day (BID) | ORAL | Status: DC
  Administered 2017-05-13 – 2017-05-24 (×22): 100 mg via ORAL
  Filled 2017-05-13 (×23): qty 1

## 2017-05-13 MED ORDER — ENOXAPARIN SODIUM 40 MG/0.4ML ~~LOC~~ SOLN
40.0000 mg | SUBCUTANEOUS | Status: DC
  Administered 2017-05-13 – 2017-05-24 (×9): 40 mg via SUBCUTANEOUS
  Filled 2017-05-13 (×11): qty 0.4

## 2017-05-13 MED ORDER — PREDNISOLONE 5 MG PO TABS
40.0000 mg | ORAL_TABLET | Freq: Every day | ORAL | Status: DC
  Filled 2017-05-13: qty 8

## 2017-05-13 MED ORDER — ONDANSETRON HCL 4 MG/2ML IJ SOLN: 4.0000 mg | Freq: Four times a day (QID) | INTRAMUSCULAR | Status: DC | PRN

## 2017-05-13 MED ORDER — ACETAMINOPHEN 325 MG PO TABS
650.0000 mg | ORAL_TABLET | Freq: Four times a day (QID) | ORAL | Status: DC | PRN
  Administered 2017-05-15: 650 mg via ORAL
  Filled 2017-05-13: qty 2

## 2017-05-13 MED ORDER — METOPROLOL TARTRATE 25 MG PO TABS
25.0000 mg | ORAL_TABLET | Freq: Two times a day (BID) | ORAL | Status: DC
  Administered 2017-05-13 – 2017-05-24 (×23): 25 mg via ORAL
  Filled 2017-05-13 (×23): qty 1
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
## 2017-05-13 NOTE — Progress Notes (Signed)
Patient walked 274f without  Need for oxygen.  Maintained O2 SAT above 93%.  Complained of shortness of breath while walking but was able to talk in whole sentences while walking.  SMarcille Blanco RN

## 2017-05-13 NOTE — ED Notes (Signed)
Patient transported to CT 

## 2017-05-13 NOTE — ED Notes (Signed)
Lissa Merlin NP at bedside

## 2017-05-13 NOTE — ED Notes (Signed)
Returned from CT

## 2017-05-13 NOTE — Progress Notes (Signed)
*  PRELIMINARY RESULTS* Echocardiogram 2D Echocardiogram has been performed.  Margaret Dyer 05/13/2017, 5:42 PM

## 2017-05-13 NOTE — ED Notes (Signed)
Pt ambulates to br with assist. Pt becomes somewhat short of breath with return but quickly resolves and O2 p6% on room air.

## 2017-05-13 NOTE — Care Management Obs Status (Signed)
[place] NOTIFICATION   Patient Details  Name: Margaret Dyer MRN: [id number] Date of Birth: 04/09/1935   Medicare Observation Status Notification Given:  Yes Patient did not agree with status, did not want to sign. Verbally reviewed with patient and daughter in room. Questions answered.     Carles Collet, RN 05/13/2017, 3:27 PM

## 2017-05-13 NOTE — Progress Notes (Signed)
Discussed care with patient with Dr. Regenia Skeeter Ms. Kriegel is a 82 year old female with pmh HTN, DM type II, anxiety; who presents with complaints of SOB with exertion with B/L leg edema.  VS:T 98.47F, P 55-81, R 16-24, BP 78/76, and O2 sat wnl Labs: Platelets 120, BUN 22, creatinine 1.36, glucose 70 Troponin 0, d-dimer 0.81, BNP 219.CXR shows mild interstitial edema. CT angiogram of the chest negative for PE but shows signs suggestive of elevated right heart pressures and cirrhotic hepatic morphology.   Patient was supposed to get an echocardiogram by PCP, but this was not able to be done. Admit for observation for the echocardiogram question underlying ACS.
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
## 2017-05-14 DIAGNOSIS — N182 Chronic kidney disease, stage 2 (mild): Secondary | ICD-10-CM | POA: Diagnosis present

## 2017-05-14 DIAGNOSIS — I159 Secondary hypertension, unspecified: Secondary | ICD-10-CM | POA: Diagnosis not present

## 2017-05-14 DIAGNOSIS — E1165 Type 2 diabetes mellitus with hyperglycemia: Secondary | ICD-10-CM | POA: Diagnosis present

## 2017-05-14 DIAGNOSIS — I5082 Biventricular heart failure: Secondary | ICD-10-CM | POA: Diagnosis present

## 2017-05-14 DIAGNOSIS — J9601 Acute respiratory failure with hypoxia: Secondary | ICD-10-CM | POA: Diagnosis not present

## 2017-05-14 DIAGNOSIS — Y95 Nosocomial condition: Secondary | ICD-10-CM | POA: Diagnosis present

## 2017-05-14 DIAGNOSIS — Z87891 Personal history of nicotine dependence: Secondary | ICD-10-CM | POA: Diagnosis not present

## 2017-05-14 DIAGNOSIS — N3281 Overactive bladder: Secondary | ICD-10-CM | POA: Diagnosis present

## 2017-05-14 DIAGNOSIS — E785 Hyperlipidemia, unspecified: Secondary | ICD-10-CM | POA: Diagnosis not present

## 2017-05-14 DIAGNOSIS — E876 Hypokalemia: Secondary | ICD-10-CM | POA: Diagnosis not present

## 2017-05-14 DIAGNOSIS — I50811 Acute right heart failure: Secondary | ICD-10-CM | POA: Diagnosis not present

## 2017-05-14 DIAGNOSIS — I5031 Acute diastolic (congestive) heart failure: Secondary | ICD-10-CM | POA: Diagnosis not present

## 2017-05-14 DIAGNOSIS — J189 Pneumonia, unspecified organism: Secondary | ICD-10-CM | POA: Diagnosis not present

## 2017-05-14 DIAGNOSIS — J45909 Unspecified asthma, uncomplicated: Secondary | ICD-10-CM | POA: Diagnosis not present

## 2017-05-14 DIAGNOSIS — R0609 Other forms of dyspnea: Secondary | ICD-10-CM | POA: Diagnosis not present

## 2017-05-14 DIAGNOSIS — I50810 Right heart failure, unspecified: Secondary | ICD-10-CM | POA: Diagnosis not present

## 2017-05-14 DIAGNOSIS — E784 Other hyperlipidemia: Secondary | ICD-10-CM | POA: Diagnosis not present

## 2017-05-14 DIAGNOSIS — F419 Anxiety disorder, unspecified: Secondary | ICD-10-CM | POA: Diagnosis not present

## 2017-05-14 DIAGNOSIS — I130 Hypertensive heart and chronic kidney disease with heart failure and stage 1 through stage 4 chronic kidney disease, or unspecified chronic kidney disease: Secondary | ICD-10-CM | POA: Diagnosis present

## 2017-05-14 DIAGNOSIS — I5033 Acute on chronic diastolic (congestive) heart failure: Secondary | ICD-10-CM | POA: Diagnosis not present

## 2017-05-14 DIAGNOSIS — J9621 Acute and chronic respiratory failure with hypoxia: Secondary | ICD-10-CM | POA: Diagnosis not present

## 2017-05-14 DIAGNOSIS — N179 Acute kidney failure, unspecified: Secondary | ICD-10-CM | POA: Diagnosis not present

## 2017-05-14 DIAGNOSIS — I2729 Other secondary pulmonary hypertension: Secondary | ICD-10-CM | POA: Diagnosis present

## 2017-05-14 DIAGNOSIS — E119 Type 2 diabetes mellitus without complications: Secondary | ICD-10-CM | POA: Diagnosis not present

## 2017-05-14 DIAGNOSIS — Z905 Acquired absence of kidney: Secondary | ICD-10-CM | POA: Diagnosis not present

## 2017-05-14 DIAGNOSIS — E1122 Type 2 diabetes mellitus with diabetic chronic kidney disease: Secondary | ICD-10-CM | POA: Diagnosis present

## 2017-05-14 DIAGNOSIS — K7581 Nonalcoholic steatohepatitis (NASH): Secondary | ICD-10-CM | POA: Diagnosis not present

## 2017-05-14 DIAGNOSIS — Z794 Long term (current) use of insulin: Secondary | ICD-10-CM | POA: Diagnosis not present

## 2017-05-14 DIAGNOSIS — R0600 Dyspnea, unspecified: Secondary | ICD-10-CM | POA: Diagnosis not present

## 2017-05-14 DIAGNOSIS — Z79899 Other long term (current) drug therapy: Secondary | ICD-10-CM | POA: Diagnosis not present

## 2017-05-14 DIAGNOSIS — E039 Hypothyroidism, unspecified: Secondary | ICD-10-CM | POA: Diagnosis present

## 2017-05-14 DIAGNOSIS — J180 Bronchopneumonia, unspecified organism: Secondary | ICD-10-CM | POA: Diagnosis present

## 2017-05-14 DIAGNOSIS — K7460 Unspecified cirrhosis of liver: Secondary | ICD-10-CM | POA: Diagnosis not present

## 2017-05-14 LAB — CBC
HEMATOCRIT: 42.3 % (ref 36.0–46.0)
HEMOGLOBIN: 14.3 g/dL (ref 12.0–15.0)
MCH: 30.1 pg (ref 26.0–34.0)
MCHC: 33.8 g/dL (ref 30.0–36.0)
MCV: 89.1 fL (ref 78.0–100.0)
Platelets: 113 10*3/uL — ABNORMAL LOW (ref 150–400)
RBC: 4.75 MIL/uL (ref 3.87–5.11)
RDW: 13.3 % (ref 11.5–15.5)
WBC: 7.2 10*3/uL (ref 4.0–10.5)

## 2017-05-14 LAB — COMPREHENSIVE METABOLIC PANEL
ALT: 54 U/L (ref 14–54)
AST: 54 U/L — ABNORMAL HIGH (ref 15–41)
Albumin: 3 g/dL — ABNORMAL LOW (ref 3.5–5.0)
Alkaline Phosphatase: 81 U/L (ref 38–126)
Anion gap: 8 (ref 5–15)
BUN: 26 mg/dL — ABNORMAL HIGH (ref 6–20)
CHLORIDE: 104 mmol/L (ref 101–111)
CO2: 24 mmol/L (ref 22–32)
Calcium: 8.6 mg/dL — ABNORMAL LOW (ref 8.9–10.3)
Creatinine, Ser: 1.05 mg/dL — ABNORMAL HIGH (ref 0.44–1.00)
GFR calc Af Amer: 57 mL/min — ABNORMAL LOW (ref >60)
GFR calc non Af Amer: 49 mL/min — ABNORMAL LOW (ref >60)
Glucose, Bld: 347 mg/dL — ABNORMAL HIGH (ref 65–99)
POTASSIUM: 4.9 mmol/L (ref 3.5–5.1)
SODIUM: 136 mmol/L (ref 135–145)
Total Bilirubin: 1.1 mg/dL (ref 0.3–1.2)
Total Protein: 6.1 g/dL — ABNORMAL LOW (ref 6.5–8.1)

## 2017-05-14 LAB — LIPID PANEL
CHOL/HDL RATIO: 5.2 ratio
Cholesterol: 218 mg/dL — ABNORMAL HIGH (ref 0–200)
HDL: 42 mg/dL (ref >40)
LDL CALC: 143 mg/dL — AB (ref 0–99)
Triglycerides: 167 mg/dL — ABNORMAL HIGH (ref <150)
VLDL: 33 mg/dL (ref 0–40)

## 2017-05-14 LAB — GLUCOSE, CAPILLARY
GLUCOSE-CAPILLARY: 284 mg/dL — AB (ref 65–99)
GLUCOSE-CAPILLARY: 361 mg/dL — AB (ref 65–99)
Glucose-Capillary: 223 mg/dL — ABNORMAL HIGH (ref 65–99)
Glucose-Capillary: 403 mg/dL — ABNORMAL HIGH (ref 65–99)

## 2017-05-14 LAB — HEMOGLOBIN A1C
HEMOGLOBIN A1C: 8.8 % — AB (ref 4.8–5.6)
MEAN PLASMA GLUCOSE: 206 mg/dL

## 2017-05-14 MED ORDER — PANTOPRAZOLE SODIUM 40 MG PO TBEC
40.0000 mg | DELAYED_RELEASE_TABLET | Freq: Every day | ORAL | Status: DC
  Administered 2017-05-14 – 2017-05-24 (×11): 40 mg via ORAL
  Filled 2017-05-14 (×11): qty 1

## 2017-05-14 MED ORDER — INSULIN GLARGINE 100 UNIT/ML ~~LOC~~ SOLN
65.0000 [IU] | Freq: Every evening | SUBCUTANEOUS | Status: DC
  Administered 2017-05-14 – 2017-05-15 (×2): 65 [IU] via SUBCUTANEOUS
  Filled 2017-05-14 (×4): qty 0.65

## 2017-05-14 MED ORDER — PREDNISONE 50 MG PO TABS
60.0000 mg | ORAL_TABLET | Freq: Every day | ORAL | Status: DC
  Administered 2017-05-14 – 2017-05-15 (×2): 60 mg via ORAL
  Filled 2017-05-14 (×2): qty 1

## 2017-05-14 MED ORDER — INSULIN ASPART 100 UNIT/ML ~~LOC~~ SOLN
8.0000 [IU] | Freq: Once | SUBCUTANEOUS | Status: AC
  Administered 2017-05-15: 8 [IU] via SUBCUTANEOUS

## 2017-05-14 MED ORDER — LORATADINE 10 MG PO TABS
10.0000 mg | ORAL_TABLET | Freq: Every day | ORAL | Status: DC
  Administered 2017-05-14 – 2017-05-24 (×11): 10 mg via ORAL
  Filled 2017-05-14 (×11): qty 1

## 2017-05-14 MED ORDER — FUROSEMIDE 10 MG/ML IJ SOLN
20.0000 mg | Freq: Every day | INTRAMUSCULAR | Status: DC
  Administered 2017-05-14: 20 mg via INTRAVENOUS
  Filled 2017-05-14: qty 2

## 2017-05-14 NOTE — Progress Notes (Signed)
Unable to obtain accurate output. Pt. States she removes collection hat from toilet before voiding. RN educated pt. About need to measure output. Pt. Verbalized understanding.

## 2017-05-14 NOTE — Progress Notes (Signed)
Inpatient Diabetes Program Recommendations  AACE/ADA: New Consensus Statement on Inpatient Glycemic Control (2015)  Target Ranges:  Prepandial:   less than 140 mg/dL      Peak postprandial:   less than 180 mg/dL (1-2 hours)      Critically ill patients:  140 - 180 mg/dL   Lab Results  Component Value Date   GLUCAP 284 (H) 05/14/2017   HGBA1C 11.3 12/02/2015    Review of Glycemic ControlResults for Margaret Dyer, Margaret Dyer (MRN [id number]) as of 05/14/2017 09:20  Ref. Range 05/13/2017 08:55 05/13/2017 11:31 05/13/2017 16:16 05/13/2017 21:48 05/14/2017 07:42  Glucose-Capillary Latest Ref Range: 65 - 99 mg/dL 388 (H) 306 (H) 305 (H) 363 (H) 284 (H)   Diabetes history: Type 2 Diabetes Outpatient Diabetes medications: Lantus 60 units q PM, Apidra 40 units q AM  Current orders for Inpatient glycemic control:  Lantus 60 units q PM, Novolog 40 units q AM, Novolog sensitive tid with meals and HS Inpatient Diabetes Program Recommendations:    Consider increasing Lantus to 65 units daily.  Also may consider d/c of Novolog with breakfast and add Novolog meal coverage 15 units tid with each meal.   Thanks, Adah Perl, RN, BC-ADM Inpatient Diabetes Coordinator Pager [phone] (8a-5p)
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
## 2017-05-14 NOTE — Progress Notes (Signed)
PROGRESS NOTE    Margaret Dyer  IFO:[id number] DOB: [birth year] DOA: 05/13/2017 PCP: Ann Held, DO   Outpatient Specialists:     Brief Narrative:  Margaret Dyer is a 82 y.o. female with a Past Medical History significant for nephrectomy, diabetes, hypothyroidism, liver cirrhosis who presents with dyspnea on exertion. Due to pt's hx of being a smoker will due trial of steroids and a duoneb. Cardiac w/u in progress. Trop x2 neg.   Assessment & Plan:   Principal Problem:   DOE (dyspnea on exertion) Active Problems:   Hypertension   Asthma   Diabetes mellitus type 2, insulin dependent ([place])   Acute kidney injury ([place])   History of nephrectomy   HLD (hyperlipidemia)   Liver cirrhosis secondary to NASH (HCC)   Thrombocytopenia (HCC)   Renal cell carcinoma   Hypothyroidism   Hypersplenism   Osteoarthritis   Overactive bladder   DOE (dyspnea on exertion)/chronic lower extremity edema -Patient presents with 2 weeks duration significant dyspnea on exertion in setting of chronic lower extremity edema -+ cough: added Claritin and PPI -Differential includes RHF vs bronchitis vs interstitial lung disease (not formally diagnosed) vs deconditioning -Echocardiogram shows diastolic dsfxn and CE negative -Telemetry -RA ambulatory pulse oximetry negative -D dimer elevated-No PE on CTA -doxy started in ER along with prednisone -outpatient pulm follow up- PFTs, ? CT scan of chest -nebs -h/o childhood asthma  Acute on chronic Diastolic CHF -low dose lasix -weight increased by 16lbs     Acute kidney injury/ History of nephrectomy 2/2 Renal cell carcinoma (2000) -Baseline renal function (2016): 25 and 0.8 -d/c IVF -start lasix: grade 2 diastolic dsfnx    Hypertension -Holding ARB as above -Continue Lopressor and Norvasc    Diabetes mellitus type 2, insulin dependent  -Therapeutic substitution NovoLog for Apidra -Continue Lantus -SSI -HgbA1c: 8.8-- improved from 1  year ago but still not controlled    HLD (hyperlipidemia) -Not on statin -Obtain lipid panel    Liver cirrhosis secondary to NASH  -LFTs are within normal limits -CT suggestive of mildly elevated right heart pressures and this could be 2/2 cirrhotic changes    Thrombocytopenia/Hypersplenism -Chronic issue -Platelets are stable and greater than 100,000    Hypothyroidism -Continue Synthroid - TSH ok    Osteoarthritis -Continue preadmission Ultram/LFTs are stable    Overactive bladder -Continue oxybutynin   DVT prophylaxis:  Lovenox   Code Status: Full Code   Family Communication:   Disposition Plan:     Consultants:        Subjective: SOB with exertion   Objective: Vitals:   05/13/17 1203 05/13/17 1458 05/13/17 2100 05/14/17 0954  BP: (!) 156/77  (!) 166/86 (!) 150/58  Pulse: 69  83 67  Resp: 20  18   Temp: 97.5 F (36.4 C)  98.5 F (36.9 C)   TempSrc: Oral  Oral   SpO2: 92% 95% 97%   Weight:    98.4 kg (217 lb)  Height:        Intake/Output Summary (Last 24 hours) at 05/14/17 1334 Last data filed at 05/14/17 0906  Gross per 24 hour  Intake             1895 ml  Output              200 ml  Net             1695 ml   Filed Weights   05/12/17 2103 05/14/17 0954  Weight: 90.7  kg (200 lb) 98.4 kg (217 lb)    Examination:  General exam: Appears calm and comfortable  Respiratory system: clear but few crackles at baseline Cardiovascular system: S1 & S2 heard, RRR. No JVD, murmurs, rubs, gallops or clicks. + pedal edema. Gastrointestinal system: Abdomen is nondistended, soft and nontender. No organomegaly or masses felt. Normal bowel sounds heard. Central nervous system: Alert and oriented. No focal neurological deficits. Extremities: Symmetric 5 x 5 power. Skin: No rashes, lesions or ulcers Psychiatry: Judgement and insight appear normal. Mood & affect appropriate.     Data Reviewed: I have personally reviewed following labs and  imaging studies  CBC:  Recent Labs Lab 05/12/17 2052 05/14/17 0334  WBC 6.7 7.2  HGB 14.6 14.3  HCT 45.0 42.3  MCV 91.3 89.1  PLT 120* 284*   Basic Metabolic Panel:  Recent Labs Lab 05/12/17 2052 05/13/17 0927 05/14/17 0334  NA 136  --  136  K 4.0  --  4.9  CL 106  --  104  CO2 22  --  24  GLUCOSE 170*  --  347*  BUN 22*  --  26*  CREATININE 1.36*  --  1.05*  CALCIUM 8.8*  --  8.6*  MG  --  1.8  --   PHOS  --  3.5  --    GFR: Estimated Creatinine Clearance: 46.2 mL/min (A) (by C-G formula based on SCr of 1.05 mg/dL (H)). Liver Function Tests:  Recent Labs Lab 05/13/17 0648 05/14/17 0334  AST 37 54*  ALT 37 54  ALKPHOS 72 81  BILITOT 0.8 1.1  PROT 5.9* 6.1*  ALBUMIN 3.1* 3.0*   No results for input(s): LIPASE, AMYLASE in the last 168 hours. No results for input(s): AMMONIA in the last 168 hours. Coagulation Profile: No results for input(s): INR, PROTIME in the last 168 hours. Cardiac Enzymes:  Recent Labs Lab 05/13/17 0325 05/13/17 0648 05/13/17 0927 05/13/17 1350  TROPONINI <0.03 <0.03 <0.03 <0.03   BNP (last 3 results) No results for input(s): PROBNP in the last 8760 hours. HbA1C:  Recent Labs  05/13/17 0927  HGBA1C 8.8*   CBG:  Recent Labs Lab 05/13/17 1131 05/13/17 1616 05/13/17 2148 05/14/17 0742 05/14/17 1131  GLUCAP 306* 305* 363* 284* 223*   Lipid Profile:  Recent Labs  05/14/17 0334  CHOL 218*  HDL 42  LDLCALC 143*  TRIG 167*  CHOLHDL 5.2   Thyroid Function Tests:  Recent Labs  05/13/17 0648  TSH 1.328   Anemia Panel: No results for input(s): VITAMINB12, FOLATE, FERRITIN, TIBC, IRON, RETICCTPCT in the last 72 hours. Urine analysis:    Component Value Date/Time   COLORURINE yellow 02/17/2011 1432   APPEARANCEUR Clear 02/17/2011 1432   LABSPEC 1.010 02/17/2011 1432   PHURINE 7.5 02/17/2011 1432   HGBUR large 02/17/2011 1432   BILIRUBINUR Neg 02/19/2015 1512   PROTEINUR Small 02/19/2015 1512    UROBILINOGEN 2.0 02/19/2015 1512   UROBILINOGEN 0.2 02/17/2011 1432   NITRITE Neg 02/19/2015 1512   NITRITE negative 02/17/2011 1432   LEUKOCYTESUR Negative 02/19/2015 1512     )No results found for this or any previous visit (from the past 240 hour(s)).    Anti-infectives    Start     Dose/Rate Route Frequency Ordered Stop   05/13/17 1430  doxycycline (VIBRA-TABS) tablet 100 mg     100 mg Oral Every 12 hours 05/13/17 1426         Radiology Studies: Dg Chest 2 View  Result  Date: 05/12/2017 CLINICAL DATA:  Chest pain and dyspnea on exertion. EXAM: CHEST  2 VIEW COMPARISON:  CXR exams dating back through 10/05/2015 FINDINGS: Heart is top-normal in size. There is aortic atherosclerosis at the arch without aneurysm. Subsegmental atelectasis and/or scarring is seen in the lingula. No pneumonic consolidation is noted. There is no pneumothorax nor effusion. Chronic diffuse interstitial prominence is noted with mild interstitial edema. No acute nor suspicious osseous abnormalities. IMPRESSION: 1. Interstitial prominence noted of the lungs bilaterally which may reflect chronic interstitial lung disease. 2. Mild interstitial edema is noted. 3. Aortic atherosclerosis. 4. Lingular atelectasis and/or scarring. Electronically Signed   By: Ashley Royalty M.D.   On: 05/12/2017 21:39   Ct Angio Chest Pe W/cm &/or Wo Cm  Result Date: 05/13/2017 CLINICAL DATA:  Chest pain, dyspnea, elevated D-dimer. EXAM: CT ANGIOGRAPHY CHEST WITH CONTRAST TECHNIQUE: Multidetector CT imaging of the chest was performed using the standard protocol during bolus administration of intravenous contrast. Multiplanar CT image reconstructions and MIPs were obtained to evaluate the vascular anatomy. CONTRAST:  75 cc Isovue 370 IV COMPARISON:  Radiographs yesterday. FINDINGS: Cardiovascular: There are no filling defects within the pulmonary arteries to suggest pulmonary embolus. Atherosclerosis of the thoracic aorta without aneurysm or  evidence of dissection. Coronary artery calcifications. Mild multi chamber cardiomegaly. Minimal contrast refluxing into the hepatic veins and IVC. Mediastinum/Nodes: No mediastinal or hilar adenopathy. No pericardial fluid. The esophagus is decompressed. Visualized thyroid gland is normal. Lungs/Pleura: Breathing motion artifact partially obscures evaluation. Linear and dependent atelectasis in both lower lobes and lingula. No confluent consolidation. No evidence of pulmonary edema. Trace bilateral pleural effusions, left slightly greater than right. Minimal fluid in the interlobar fissure on the left. No septal thickening. Upper Abdomen: Nodular hepatic contours. No upper abdominal ascites. No acute abnormality. Musculoskeletal: There are no acute or suspicious osseous abnormalities. Degenerative change in the spine. Review of the MIP images confirms the above findings. IMPRESSION: 1. No pulmonary embolus. 2. Thoracic aortic atherosclerosis and coronary artery calcifications. Minimal contrast refluxing into the hepatic veins and IVC suggest elevated right heart pressures. Trace pleural effusions. 3. Cirrhotic hepatic morphology incidentally noted in the upper abdomen. Electronically Signed   By: Jeb Levering M.D.   On: 05/13/2017 05:47        Scheduled Meds: . amLODipine  10 mg Oral Daily  . doxycycline  100 mg Oral Q12H  . enoxaparin (LOVENOX) injection  40 mg Subcutaneous Q24H  . furosemide  20 mg Intravenous Daily  . gabapentin  100 mg Oral BID  . insulin aspart  0-5 Units Subcutaneous QHS  . insulin aspart  0-9 Units Subcutaneous TID WC  . insulin aspart  40 Units Subcutaneous Q breakfast  . insulin glargine  65 Units Subcutaneous QPM  . levothyroxine  25 mcg Oral QAC breakfast  . loratadine  10 mg Oral Daily  . metoprolol tartrate  25 mg Oral BID  . oxybutynin  5 mg Oral BH-q7a  . pantoprazole  40 mg Oral Daily  . predniSONE  60 mg Oral Q breakfast  . sodium chloride flush  3 mL  Intravenous Q12H   Continuous Infusions:   LOS: 0 days    Time spent: 25 min    [place], DO [facility] Pager [phone]  If 7PM-7AM, please contact night-coverage [email or web address] Password [email or web address] 05/14/2017, 1:34 PM

## 2017-05-14 NOTE — Progress Notes (Signed)
CM talked to patient about Medicare Observational Status; all questions answered and now she is OK with status determination at this time; CM will continue to follow status/ DCP; B Pennie Rushing [phone]

## 2017-05-15 LAB — BASIC METABOLIC PANEL
ANION GAP: 7 (ref 5–15)
Anion gap: 8 (ref 5–15)
BUN: 33 mg/dL — ABNORMAL HIGH (ref 6–20)
BUN: 33 mg/dL — ABNORMAL HIGH (ref 6–20)
CALCIUM: 8.8 mg/dL — AB (ref 8.9–10.3)
CHLORIDE: 106 mmol/L (ref 101–111)
CO2: 22 mmol/L (ref 22–32)
CO2: 23 mmol/L (ref 22–32)
CREATININE: 1.03 mg/dL — AB (ref 0.44–1.00)
CREATININE: 1.27 mg/dL — AB (ref 0.44–1.00)
Calcium: 8.8 mg/dL — ABNORMAL LOW (ref 8.9–10.3)
Chloride: 103 mmol/L (ref 101–111)
GFR calc non Af Amer: 50 mL/min — ABNORMAL LOW (ref >60)
GFR, EST AFRICAN AMERICAN: 45 mL/min — AB (ref >60)
GFR, EST AFRICAN AMERICAN: 58 mL/min — AB (ref >60)
GFR, EST NON AFRICAN AMERICAN: 39 mL/min — AB (ref >60)
Glucose, Bld: 202 mg/dL — ABNORMAL HIGH (ref 65–99)
Glucose, Bld: 346 mg/dL — ABNORMAL HIGH (ref 65–99)
Potassium: 4 mmol/L (ref 3.5–5.1)
Potassium: 4.5 mmol/L (ref 3.5–5.1)
SODIUM: 133 mmol/L — AB (ref 135–145)
SODIUM: 136 mmol/L (ref 135–145)

## 2017-05-15 LAB — GLUCOSE, CAPILLARY
GLUCOSE-CAPILLARY: 247 mg/dL — AB (ref 65–99)
GLUCOSE-CAPILLARY: 273 mg/dL — AB (ref 65–99)
GLUCOSE-CAPILLARY: 269 mg/dL — AB (ref 65–99)
Glucose-Capillary: 137 mg/dL — ABNORMAL HIGH (ref 65–99)
Glucose-Capillary: 97 mg/dL (ref 65–99)

## 2017-05-15 LAB — CBC
HCT: 43.8 % (ref 36.0–46.0)
HEMOGLOBIN: 14.1 g/dL (ref 12.0–15.0)
MCH: 29.4 pg (ref 26.0–34.0)
MCHC: 32.2 g/dL (ref 30.0–36.0)
MCV: 91.3 fL (ref 78.0–100.0)
Platelets: 112 10*3/uL — ABNORMAL LOW (ref 150–400)
RBC: 4.8 MIL/uL (ref 3.87–5.11)
RDW: 14 % (ref 11.5–15.5)
WBC: 10.4 10*3/uL (ref 4.0–10.5)

## 2017-05-15 MED ORDER — PREDNISONE 20 MG PO TABS
40.0000 mg | ORAL_TABLET | Freq: Every day | ORAL | Status: DC
  Administered 2017-05-16: 40 mg via ORAL
  Filled 2017-05-15: qty 2

## 2017-05-15 MED ORDER — FUROSEMIDE 10 MG/ML IJ SOLN
40.0000 mg | Freq: Every day | INTRAMUSCULAR | Status: DC
  Administered 2017-05-15: 40 mg via INTRAVENOUS
  Filled 2017-05-15: qty 4

## 2017-05-15 MED ORDER — INSULIN ASPART 100 UNIT/ML ~~LOC~~ SOLN
15.0000 [IU] | Freq: Three times a day (TID) | SUBCUTANEOUS | Status: DC
  Administered 2017-05-15 – 2017-05-16 (×2): 15 [IU] via SUBCUTANEOUS

## 2017-05-15 NOTE — Progress Notes (Signed)
PROGRESS NOTE    Margaret Dyer  EXH:[id number] DOB: 1935-07-08 DOA: 05/13/2017 PCP: Ann Held, DO   Outpatient Specialists:     Brief Narrative:  Margaret Dyer is a 82 y.o. female with a Past Medical History significant for nephrectomy, diabetes, hypothyroidism, liver cirrhosis who presents with dyspnea on exertion. Due to pt's hx of being a smoker will due trial of steroids and a duoneb. Cardiac w/u in progress. Trop x2 neg.   Assessment & Plan:   Principal Problem:   DOE (dyspnea on exertion) Active Problems:   Hypertension   Asthma   Diabetes mellitus type 2, insulin dependent ([place])   Acute kidney injury ([place])   History of nephrectomy   HLD (hyperlipidemia)   Liver cirrhosis secondary to NASH (HCC)   Thrombocytopenia (H[facility])   Renal cell carcinoma   Hypothyroidism   Hypersplenism   Osteoarthritis   Overactive bladder   Dyspnea   DOE (dyspnea on exertion)/chronic lower extremity edema -Patient presents with 2 weeks duration significant dyspnea on exertion in setting of chronic lower extremity edema -+ cough: added Claritin and PPI and cough has improved this AM -Differential includes RHF vs bronchitis vs interstitial lung disease (not formally diagnosed) vs deconditioning -Echocardiogram shows diastolic dsfxn and CE negative -RA ambulatory pulse oximetry negative -D dimer elevated-No PE on CTA -doxy started in ER along with prednisone -outpatient pulm follow up- PFTs, ? High resolution CT scan of chest -nebs -h/o childhood asthma  Acute on chronic Diastolic CHF -IV lasix -weight increased by 16lbs will continue to monitor    Acute kidney injury/ History of nephrectomy 2/2 Renal cell carcinoma (2000) -Baseline renal function (2016): 25 and 0.8 -d/c IVF -start lasix: grade 2 diastolic dsfnx -daily BMP    Hypertension -Holding ARB as above -Continue Lopressor and Norvasc    Diabetes mellitus type 2, insulin dependent  -add novolog with  meals -increase Lantus -SSI -HgbA1c: 8.8-- improved from 1 year ago but still not controlled    HLD (hyperlipidemia) -Not on statin -Obtain lipid panel    Liver cirrhosis secondary to NASH  -LFTs are within normal limits -CT suggestive of mildly elevated right heart pressures and this could be 2/2 cirrhotic changes    Thrombocytopenia/Hypersplenism -Chronic issue -Platelets are stable and greater than 100,000    Hypothyroidism -Continue Synthroid - TSH ok    Osteoarthritis -Continue preadmission Ultram/LFTs are stable    Overactive bladder -Continue oxybutynin   DVT prophylaxis:  Lovenox   Code Status: Full Code   Family Communication:   Disposition Plan:  Home in AM   Consultants:        Subjective: Less cough Breathing has improved but not back to baseline -PCP has ordered nebulizer for patient  Objective: Vitals:   05/14/17 1735 05/14/17 2125 05/15/17 0532 05/15/17 1035  BP:  (!) 151/78 (!) 149/71 125/77  Pulse:  85 61 78  Resp:  (!) 24 20   Temp:  98.1 F (36.7 C) 97.8 F (36.6 C)   TempSrc:  Oral Oral   SpO2: 93% 97% 95%   Weight:   98.7 kg (217 lb 11.2 oz)   Height:        Intake/Output Summary (Last 24 hours) at 05/15/17 1201 Last data filed at 05/15/17 0345  Gross per 24 hour  Intake              480 ml  Output             1450 ml  Net             -970 ml   Filed Weights   05/12/17 2103 05/14/17 0954 05/15/17 0532  Weight: 90.7 kg (200 lb) 98.4 kg (217 lb) 98.7 kg (217 lb 11.2 oz)    Examination:  General exam: A+Ox3, NAD Respiratory system: no wheezing, few crackles at bases Cardiovascular system: rrr-- minimal LE edema Gastrointestinal system: +Bs, soft Central nervous system: Alert and oriented. No focal neurological deficits. Extremities: Symmetric 5 x 5 power. Skin: bruising around right eye    Data Reviewed: I have personally reviewed following labs and imaging studies  CBC:  Recent Labs Lab  05/12/17 2052 05/14/17 0334 05/15/17 0527  WBC 6.7 7.2 10.4  HGB 14.6 14.3 14.1  HCT 45.0 42.3 43.8  MCV 91.3 89.1 91.3  PLT 120* 113* 600*   Basic Metabolic Panel:  Recent Labs Lab 05/12/17 2052 05/13/17 0927 05/14/17 0334 05/14/17 2349 05/15/17 0527  NA 136  --  136 133* 136  K 4.0  --  4.9 4.5 4.0  CL 106  --  104 103 106  CO2 22  --  _0 GLUCOSE 170*  --  347* 346* 202*  BUN 22*  --  26* 33* 33*  CREATININE 1.36*  --  1.05* 1.27* 1.03*  CALCIUM 8.8*  --  8.6* 8.8* 8.8*  MG  --  1.8  --   --   --   PHOS  --  3.5  --   --   --    GFR: Estimated Creatinine Clearance: 47.2 mL/min (A) (by C-G formula based on SCr of 1.03 mg/dL (H)). Liver Function Tests:  Recent Labs Lab 05/13/17 0648 05/14/17 0334  AST 37 54*  ALT 37 54  ALKPHOS 72 81  BILITOT 0.8 1.1  PROT 5.9* 6.1*  ALBUMIN 3.1* 3.0*   No results for input(s): LIPASE, AMYLASE in the last 168 hours. No results for input(s): AMMONIA in the last 168 hours. Coagulation Profile: No results for input(s): INR, PROTIME in the last 168 hours. Cardiac Enzymes:  Recent Labs Lab 05/13/17 0325 05/13/17 0648 05/13/17 0927 05/13/17 1350  TROPONINI <0.03 <0.03 <0.03 <0.03   BNP (last 3 results) No results for input(s): PROBNP in the last 8760 hours. HbA1C:  Recent Labs  05/13/17 0927  HGBA1C 8.8*   CBG:  Recent Labs Lab 05/14/17 1131 05/14/17 1645 05/14/17 2203 05/15/17 0258 05/15/17 0741  GLUCAP 223* 361* 403* 247* 137*   Lipid Profile:  Recent Labs  05/14/17 0334  CHOL 218*  HDL 42  LDLCALC 143*  TRIG 167*  CHOLHDL 5.2   Thyroid Function Tests:  Recent Labs  05/13/17 0648  TSH 1.328   Anemia Panel: No results for input(s): VITAMINB12, FOLATE, FERRITIN, TIBC, IRON, RETICCTPCT in the last 72 hours. Urine analysis:    Component Value Date/Time   COLORURINE yellow 02/17/2011 1432   APPEARANCEUR Clear 02/17/2011 1432   LABSPEC 1.010 02/17/2011 1432   PHURINE 7.5 02/17/2011  1432   HGBUR large 02/17/2011 1432   BILIRUBINUR Neg 02/19/2015 1512   PROTEINUR Small 02/19/2015 1512   UROBILINOGEN 2.0 02/19/2015 1512   UROBILINOGEN 0.2 02/17/2011 1432   NITRITE Neg 02/19/2015 1512   NITRITE negative 02/17/2011 1432   LEUKOCYTESUR Negative 02/19/2015 1512     )No results found for this or any previous visit (from the past 240 hour(s)).    Anti-infectives    Start     Dose/Rate Route Frequency Ordered Stop  05/13/17 1430  doxycycline (VIBRA-TABS) tablet 100 mg     100 mg Oral Every 12 hours 05/13/17 1426         Radiology Studies: No results found.      Scheduled Meds: . amLODipine  10 mg Oral Daily  . doxycycline  100 mg Oral Q12H  . enoxaparin (LOVENOX) injection  40 mg Subcutaneous Q24H  . furosemide  40 mg Intravenous Daily  . gabapentin  100 mg Oral BID  . insulin aspart  0-5 Units Subcutaneous QHS  . insulin aspart  0-9 Units Subcutaneous TID WC  . insulin aspart  15 Units Subcutaneous TID WC  . insulin glargine  65 Units Subcutaneous QPM  . levothyroxine  25 mcg Oral QAC breakfast  . loratadine  10 mg Oral Daily  . metoprolol tartrate  25 mg Oral BID  . oxybutynin  5 mg Oral BH-q7a  . pantoprazole  40 mg Oral Daily  . predniSONE  60 mg Oral Q breakfast  . sodium chloride flush  3 mL Intravenous Q12H   Continuous Infusions:   LOS: 1 day    Time spent: 25 min    [place], DO [facility] Pager [phone]  If 7PM-7AM, please contact night-coverage [email or web address] Password [facility] 05/15/2017, 12:01 PM
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
## 2017-05-15 NOTE — Consult Note (Signed)
   [facility] CM Inpatient Consult   05/15/2017  ELLEAH HEMSLEY [birth year] [id number]  Patient was assess for [facility] Management needs as a beneficiary of the Medicare Next Gen ACO.  Patient was discussed in Progression meeting for declining post hospital services and resources.  Met with the patient at the bedside to offer [facility] for follow up and services available to her in the network.  Patient states that she doesn't mind having follow up telephones calls because she would want to have someone to come to her daughter's house.  Patient endorses Dr. Roma Schanz as her primary care provider.  [facility] does provide their patient's with post hospital transition of care calls.  She states that her daughters are insisting she stay with them when she leave the hospital for a time.  Patient accepted the brochure, 24 hour nurse advise line magnet with contact information.  For questions, please contact:  Natividad Brood, RN BSN [facility] Liaison  [phone] business mobile phone Toll free office [phone]

## 2017-05-16 DIAGNOSIS — I5031 Acute diastolic (congestive) heart failure: Secondary | ICD-10-CM | POA: Diagnosis present

## 2017-05-16 DIAGNOSIS — Z905 Acquired absence of kidney: Secondary | ICD-10-CM

## 2017-05-16 DIAGNOSIS — K7460 Unspecified cirrhosis of liver: Secondary | ICD-10-CM

## 2017-05-16 DIAGNOSIS — E785 Hyperlipidemia, unspecified: Secondary | ICD-10-CM

## 2017-05-16 DIAGNOSIS — I5032 Chronic diastolic (congestive) heart failure: Secondary | ICD-10-CM | POA: Diagnosis present

## 2017-05-16 DIAGNOSIS — N179 Acute kidney failure, unspecified: Secondary | ICD-10-CM

## 2017-05-16 DIAGNOSIS — K7581 Nonalcoholic steatohepatitis (NASH): Secondary | ICD-10-CM

## 2017-05-16 LAB — CBC
HEMATOCRIT: 42.9 % (ref 36.0–46.0)
HEMOGLOBIN: 14.2 g/dL (ref 12.0–15.0)
MCH: 30.1 pg (ref 26.0–34.0)
MCHC: 33.1 g/dL (ref 30.0–36.0)
MCV: 90.9 fL (ref 78.0–100.0)
Platelets: 123 10*3/uL — ABNORMAL LOW (ref 150–400)
RBC: 4.72 MIL/uL (ref 3.87–5.11)
RDW: 14 % (ref 11.5–15.5)
WBC: 12.2 10*3/uL — AB (ref 4.0–10.5)

## 2017-05-16 LAB — BASIC METABOLIC PANEL
Anion gap: 9 (ref 5–15)
BUN: 37 mg/dL — AB (ref 6–20)
CHLORIDE: 104 mmol/L (ref 101–111)
CO2: 25 mmol/L (ref 22–32)
Calcium: 8.4 mg/dL — ABNORMAL LOW (ref 8.9–10.3)
Creatinine, Ser: 1.15 mg/dL — ABNORMAL HIGH (ref 0.44–1.00)
GFR calc Af Amer: 51 mL/min — ABNORMAL LOW (ref >60)
GFR, EST NON AFRICAN AMERICAN: 44 mL/min — AB (ref >60)
GLUCOSE: 104 mg/dL — AB (ref 65–99)
POTASSIUM: 3.8 mmol/L (ref 3.5–5.1)
Sodium: 138 mmol/L (ref 135–145)

## 2017-05-16 LAB — GLUCOSE, CAPILLARY
GLUCOSE-CAPILLARY: 74 mg/dL (ref 65–99)
GLUCOSE-CAPILLARY: 265 mg/dL — AB (ref 65–99)
GLUCOSE-CAPILLARY: 252 mg/dL — AB (ref 65–99)
Glucose-Capillary: 59 mg/dL — ABNORMAL LOW (ref 65–99)
Glucose-Capillary: 149 mg/dL — ABNORMAL HIGH (ref 65–99)

## 2017-05-16 MED ORDER — ATORVASTATIN CALCIUM 20 MG PO TABS
20.0000 mg | ORAL_TABLET | Freq: Every day | ORAL | Status: DC
  Administered 2017-05-16 – 2017-05-23 (×8): 20 mg via ORAL
  Filled 2017-05-16 (×8): qty 1

## 2017-05-16 MED ORDER — PREDNISONE 20 MG PO TABS
40.0000 mg | ORAL_TABLET | Freq: Every day | ORAL | Status: DC
  Administered 2017-05-17: 40 mg via ORAL
  Filled 2017-05-16: qty 2

## 2017-05-16 MED ORDER — INSULIN GLARGINE 100 UNIT/ML ~~LOC~~ SOLN
60.0000 [IU] | Freq: Every evening | SUBCUTANEOUS | Status: DC
  Administered 2017-05-16 – 2017-05-19 (×4): 60 [IU] via SUBCUTANEOUS
  Filled 2017-05-16 (×5): qty 0.6

## 2017-05-16 MED ORDER — FUROSEMIDE 10 MG/ML IJ SOLN
20.0000 mg | Freq: Two times a day (BID) | INTRAMUSCULAR | Status: DC
  Administered 2017-05-16 – 2017-05-17 (×2): 20 mg via INTRAVENOUS
  Filled 2017-05-16 (×2): qty 2

## 2017-05-16 MED ORDER — HYDRALAZINE HCL 25 MG PO TABS
25.0000 mg | ORAL_TABLET | Freq: Three times a day (TID) | ORAL | Status: DC
  Administered 2017-05-16 – 2017-05-20 (×14): 25 mg via ORAL
  Filled 2017-05-16 (×14): qty 1

## 2017-05-16 MED ORDER — INSULIN ASPART 100 UNIT/ML ~~LOC~~ SOLN
15.0000 [IU] | Freq: Three times a day (TID) | SUBCUTANEOUS | Status: DC
  Administered 2017-05-17 – 2017-05-24 (×14): 15 [IU] via SUBCUTANEOUS

## 2017-05-16 MED ORDER — FUROSEMIDE 10 MG/ML IJ SOLN: 20.0000 mg | Freq: Every day | INTRAMUSCULAR | Status: DC

## 2017-05-16 NOTE — Progress Notes (Signed)
PROGRESS NOTE    Margaret Dyer  DKS:[id number] DOB: [birth year] DOA: 05/13/2017 PCP: Ann Held, DO   Outpatient Specialists:     Brief Narrative:  Margaret Dyer is a 82 y.o. female with a Past Medical History significant for nephrectomy, diabetes, hypothyroidism, liver cirrhosis who presents with dyspnea on exertion. Due to pt's hx of being a smoker will due trial of steroids and a duoneb. Appears to be very anxious and responding only partially to steroids/diuresis.  Patient with this subjective SOB but no hypoxia.  Will get cardiology to see here and then pulmonary appointment if no answers.    Assessment & Plan:   Principal Problem:   DOE (dyspnea on exertion) Active Problems:   Hypertension   Asthma   Diabetes mellitus type 2, insulin dependent ([place])   Acute kidney injury ([place])   History of nephrectomy   HLD (hyperlipidemia)   Liver cirrhosis secondary to NASH ([facility])   Thrombocytopenia ([facility])   Renal cell carcinoma   Hypothyroidism   Hypersplenism   Osteoarthritis   Overactive bladder   Dyspnea   DOE (dyspnea on exertion)/chronic lower extremity edema -Patient presents with 2 weeks duration significant dyspnea on exertion in setting of chronic lower extremity edema -+ cough: added Claritin and PPI and cough has resolved -Echocardiogram shows diastolic dsfxn and CE negative -RA ambulatory pulse oximetry negative -D dimer elevated-No PE on CTA -x ray suggestive of interstitial lung disease -doxy started in ER along with prednisone with no improvement in the subjective SOB -outpatient pulm follow up- PFTs, ? High resolution CT scan of chest -will get cardiology eval here as patient very anxious -nebs -h/o childhood asthma  Acute on chronic Diastolic CHF -IV lasix -weight increased by 16lbs from last office visit -I/os    Acute kidney injury/ History of nephrectomy 2/2 Renal cell carcinoma (2000) -Baseline renal function (2016): 25 and 0.8 -started  lasix: grade 2 diastolic dsfnx -daily BMP    Hypertension -Holding ARB as above -Continue Lopressor and Norvasc    Diabetes mellitus type 2, insulin dependent  -add novolog with meals -increase Lantus -SSI -HgbA1c: 8.8-- improved from 1 year ago but still not controlled    HLD (hyperlipidemia) -add statin for LDL > 143    Liver cirrhosis secondary to NASH  -LFTs are within normal limits -CT suggestive of mildly elevated right heart pressures and this could be 2/2 cirrhotic changes    Thrombocytopenia/Hypersplenism -Chronic issue -CBC daily    Hypothyroidism -Continue Synthroid - TSH ok    Osteoarthritis -Continue preadmission Ultram/LFTs are stable    Overactive bladder -Continue oxybutynin   DVT prophylaxis:  Lovenox   Code Status: Full Code   Family Communication: Patient declined  Disposition Plan:  Home once work up complete   Consultants:   cards     Subjective: Cough resolved but still with SOB at rest and worse with exertion Able to carry on conversations with out interruption of speech Said she was wheezing this AM  Objective: Vitals:   05/15/17 1227 05/15/17 2027 05/15/17 2052 05/16/17 0453  BP: (!) 130/53 (!) 130/53  (!) 135/52  Pulse: 70 69  79  Resp: _0 Temp: 98.1 F (36.7 C) 97.8 F (36.6 C)  97.9 F (36.6 C)  TempSrc: Oral Oral  Oral  SpO2: 91% 95% 94% 92%  Weight:    98.2 kg (216 lb 6.4 oz)  Height:        Intake/Output Summary (Last 24 hours)  at 05/16/17 1010 Last data filed at 05/16/17 1499  Gross per 24 hour  Intake              480 ml  Output             1500 ml  Net            -1020 ml   Filed Weights   05/14/17 0954 05/15/17 0532 05/16/17 0453  Weight: 98.4 kg (217 lb) 98.7 kg (217 lb 11.2 oz) 98.2 kg (216 lb 6.4 oz)    Examination:  General exam: no increased work of breathing Respiratory system: no wheezing, few crackles at bases Cardiovascular system: rrr--  LE edema Gastrointestinal  system: +BS, NT,, obese Central nervous system: A+Ox3, NAD Extremities: moves all 4 ext Skin: bruising around right eye lessneded    Data Reviewed: I have personally reviewed following labs and imaging studies  CBC:  Recent Labs Lab 05/12/17 2052 05/14/17 0334 05/15/17 0527 05/16/17 0233  WBC 6.7 7.2 10.4 12.2*  HGB 14.6 14.3 14.1 14.2  HCT 45.0 42.3 43.8 42.9  MCV 91.3 89.1 91.3 90.9  PLT 120* 113* 112* 692*   Basic Metabolic Panel:  Recent Labs Lab 05/12/17 2052 05/13/17 0927 05/14/17 0334 05/14/17 2349 05/15/17 0527 05/16/17 0233  NA 136  --  136 133* 136 138  K 4.0  --  4.9 4.5 4.0 3.8  CL 106  --  104 103 106 104  CO2 22  --  _0 GLUCOSE 170*  --  347* 346* 202* 104*  BUN 22*  --  26* 33* 33* 37*  CREATININE 1.36*  --  1.05* 1.27* 1.03* 1.15*  CALCIUM 8.8*  --  8.6* 8.8* 8.8* 8.4*  MG  --  1.8  --   --   --   --   PHOS  --  3.5  --   --   --   --    GFR: Estimated Creatinine Clearance: 42.1 mL/min (A) (by C-G formula based on SCr of 1.15 mg/dL (H)). Liver Function Tests:  Recent Labs Lab 05/13/17 0648 05/14/17 0334  AST 37 54*  ALT 37 54  ALKPHOS 72 81  BILITOT 0.8 1.1  PROT 5.9* 6.1*  ALBUMIN 3.1* 3.0*   No results for input(s): LIPASE, AMYLASE in the last 168 hours. No results for input(s): AMMONIA in the last 168 hours. Coagulation Profile: No results for input(s): INR, PROTIME in the last 168 hours. Cardiac Enzymes:  Recent Labs Lab 05/13/17 0325 05/13/17 0648 05/13/17 0927 05/13/17 1350  TROPONINI <0.03 <0.03 <0.03 <0.03   BNP (last 3 results) No results for input(s): PROBNP in the last 8760 hours. HbA1C: No results for input(s): HGBA1C in the last 72 hours. CBG:  Recent Labs Lab 05/15/17 1226 05/15/17 1601 05/15/17 2126 05/16/17 0756 05/16/17 0820  GLUCAP 97 273* 269* 59* 74   Lipid Profile:  Recent Labs  05/14/17 0334  CHOL 218*  HDL 42  LDLCALC 143*  TRIG 167*  CHOLHDL 5.2   Thyroid Function  Tests: No results for input(s): TSH, T4TOTAL, FREET4, T3FREE, THYROIDAB in the last 72 hours. Anemia Panel: No results for input(s): VITAMINB12, FOLATE, FERRITIN, TIBC, IRON, RETICCTPCT in the last 72 hours. Urine analysis:    Component Value Date/Time   COLORURINE yellow 02/17/2011 1432   APPEARANCEUR Clear 02/17/2011 1432   LABSPEC 1.010 02/17/2011 1432   PHURINE 7.5 02/17/2011 1432   HGBUR large 02/17/2011 1432   BILIRUBINUR Neg 02/19/2015 1512  PROTEINUR Small 02/19/2015 1512   UROBILINOGEN 2.0 02/19/2015 1512   UROBILINOGEN 0.2 02/17/2011 1432   NITRITE Neg 02/19/2015 1512   NITRITE negative 02/17/2011 1432   LEUKOCYTESUR Negative 02/19/2015 1512     )No results found for this or any previous visit (from the past 240 hour(s)).    Anti-infectives    Start     Dose/Rate Route Frequency Ordered Stop   05/13/17 1430  doxycycline (VIBRA-TABS) tablet 100 mg     100 mg Oral Every 12 hours 05/13/17 1426         Radiology Studies: No results found.      Scheduled Meds: . amLODipine  10 mg Oral Daily  . doxycycline  100 mg Oral Q12H  . enoxaparin (LOVENOX) injection  40 mg Subcutaneous Q24H  . [START ON 05/17/2017] furosemide  20 mg Intravenous Daily  . gabapentin  100 mg Oral BID  . insulin aspart  0-5 Units Subcutaneous QHS  . insulin aspart  0-9 Units Subcutaneous TID WC  . insulin aspart  15 Units Subcutaneous TID WC  . insulin glargine  65 Units Subcutaneous QPM  . levothyroxine  25 mcg Oral QAC breakfast  . loratadine  10 mg Oral Daily  . metoprolol tartrate  25 mg Oral BID  . oxybutynin  5 mg Oral BH-q7a  . pantoprazole  40 mg Oral Daily  . predniSONE  40 mg Oral Q breakfast  . sodium chloride flush  3 mL Intravenous Q12H   Continuous Infusions:   LOS: 2 days    Time spent: 25 min    [place], DO [facility] Pager [phone]  If 7PM-7AM, please contact night-coverage [email or web address] Password [email or web address] 05/16/2017, 10:10 AM

## 2017-05-16 NOTE — Progress Notes (Addendum)
Pt continues to get short of breath after ambulating to the bathroom. O2 sat = 93% on room air upon return to recliner. PRN Albuterol given. Will continue to monitor.  Pt with a 2nd occurrence of dyspnea on exertion with O2 sat of 94% this AM after bathroom visit.

## 2017-05-16 NOTE — Progress Notes (Signed)
Pt daughter called and expressed concerns for pt shortness of breath. Went to pt bedside and pt sleeping. Informed MD, MD stated she will place consult. MD stated she wanted daily ambulation saturations  Amillion Macchia
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
## 2017-05-16 NOTE — Progress Notes (Signed)
Pt refusing bed alarm. Pt aware of risks including fall and injury, pt still refused

## 2017-05-16 NOTE — Progress Notes (Signed)
Pt states she does not want her long acting insulin at this time. States she takes it at 9PM at home. Moved accordingly in the [facility] and called pharmacy to verify

## 2017-05-16 NOTE — Progress Notes (Signed)
Pt ambulation saturation completed with mobility tech. Pt tolerated well and back in chair sitting up right. Pt educated on use of incentive spirometer

## 2017-05-16 NOTE — Progress Notes (Signed)
Pt CBG low, orange juice given, CBG rechecked and within normal limits. Will continue to monitor

## 2017-05-16 NOTE — Progress Notes (Signed)
Event organiser on hydralazine, new medication. Went over at bedside with pt, pt understands

## 2017-05-16 NOTE — Progress Notes (Signed)
Pt states she uses a walker at home but "it is in very bad shape". Informed case manager for walker at discharge

## 2017-05-17 ENCOUNTER — Telehealth: Payer: Self-pay | Admitting: Internal Medicine

## 2017-05-17 ENCOUNTER — Encounter (HOSPITAL_COMMUNITY): Payer: Self-pay | Admitting: Pulmonary Disease

## 2017-05-17 DIAGNOSIS — R0609 Other forms of dyspnea: Secondary | ICD-10-CM

## 2017-05-17 DIAGNOSIS — I50810 Right heart failure, unspecified: Secondary | ICD-10-CM

## 2017-05-17 DIAGNOSIS — J9601 Acute respiratory failure with hypoxia: Secondary | ICD-10-CM

## 2017-05-17 DIAGNOSIS — J45909 Unspecified asthma, uncomplicated: Secondary | ICD-10-CM

## 2017-05-17 DIAGNOSIS — I5031 Acute diastolic (congestive) heart failure: Secondary | ICD-10-CM

## 2017-05-17 LAB — BASIC METABOLIC PANEL
Anion gap: 8 (ref 5–15)
BUN: 37 mg/dL — AB (ref 6–20)
CHLORIDE: 102 mmol/L (ref 101–111)
CO2: 26 mmol/L (ref 22–32)
Calcium: 8.5 mg/dL — ABNORMAL LOW (ref 8.9–10.3)
Creatinine, Ser: 1.08 mg/dL — ABNORMAL HIGH (ref 0.44–1.00)
GFR calc Af Amer: 55 mL/min — ABNORMAL LOW (ref >60)
GFR calc non Af Amer: 48 mL/min — ABNORMAL LOW (ref >60)
GLUCOSE: 229 mg/dL — AB (ref 65–99)
POTASSIUM: 4.2 mmol/L (ref 3.5–5.1)
Sodium: 136 mmol/L (ref 135–145)

## 2017-05-17 LAB — GLUCOSE, CAPILLARY
GLUCOSE-CAPILLARY: 149 mg/dL — AB (ref 65–99)
GLUCOSE-CAPILLARY: 126 mg/dL — AB (ref 65–99)
GLUCOSE-CAPILLARY: 281 mg/dL — AB (ref 65–99)
GLUCOSE-CAPILLARY: 257 mg/dL — AB (ref 65–99)
Glucose-Capillary: 44 mg/dL — CL (ref 65–99)
Glucose-Capillary: 157 mg/dL — ABNORMAL HIGH (ref 65–99)
Glucose-Capillary: 222 mg/dL — ABNORMAL HIGH (ref 65–99)

## 2017-05-17 LAB — CBC
HEMATOCRIT: 40.8 % (ref 36.0–46.0)
HEMOGLOBIN: 13.9 g/dL (ref 12.0–15.0)
MCH: 31 pg (ref 26.0–34.0)
MCHC: 34.1 g/dL (ref 30.0–36.0)
MCV: 90.9 fL (ref 78.0–100.0)
Platelets: 104 10*3/uL — ABNORMAL LOW (ref 150–400)
RBC: 4.49 MIL/uL (ref 3.87–5.11)
RDW: 14.2 % (ref 11.5–15.5)
WBC: 10.8 10*3/uL — ABNORMAL HIGH (ref 4.0–10.5)

## 2017-05-17 LAB — URINALYSIS, ROUTINE W REFLEX MICROSCOPIC
BILIRUBIN URINE: NEGATIVE
GLUCOSE, UA: NEGATIVE mg/dL
Hgb urine dipstick: NEGATIVE
KETONES UR: NEGATIVE mg/dL
LEUKOCYTES UA: NEGATIVE
NITRITE: NEGATIVE
Protein, ur: 100 mg/dL — AB
Specific Gravity, Urine: 1.01 (ref 1.005–1.030)
pH: 5 (ref 5.0–8.0)

## 2017-05-17 MED ORDER — LIP MEDEX EX OINT
TOPICAL_OINTMENT | CUTANEOUS | Status: DC | PRN
  Filled 2017-05-17: qty 7

## 2017-05-17 MED ORDER — FUROSEMIDE 10 MG/ML IJ SOLN
40.0000 mg | Freq: Two times a day (BID) | INTRAMUSCULAR | Status: DC
  Administered 2017-05-17 – 2017-05-18 (×2): 40 mg via INTRAVENOUS
  Filled 2017-05-17 (×2): qty 4

## 2017-05-17 MED ORDER — BLISTEX MEDICATED EX OINT
TOPICAL_OINTMENT | CUTANEOUS | Status: DC | PRN
  Administered 2017-05-17: 15:00:00 via TOPICAL
  Filled 2017-05-17: qty 6.3

## 2017-05-17 MED ORDER — FUROSEMIDE 10 MG/ML IJ SOLN
20.0000 mg | INTRAMUSCULAR | Status: AC
  Administered 2017-05-17: 20 mg via INTRAVENOUS
  Filled 2017-05-17: qty 2

## 2017-05-17 MED ORDER — BIOTENE DRY MOUTH MT LIQD
15.0000 mL | OROMUCOSAL | Status: DC | PRN
  Administered 2017-05-17: 15 mL via OROMUCOSAL
  Filled 2017-05-17: qty 15

## 2017-05-17 NOTE — Progress Notes (Signed)
Pt c/o nausea and SOB with exertion.  RN gave pt PRN zofran for nausea.  Pt received breathing treatment at 0517, not time for another one.  Will notify MD.

## 2017-05-17 NOTE — Progress Notes (Signed)
Progress Note  Patient Name: Margaret Dyer Date of Encounter: 05/17/2017  Primary Cardiologist: Dr. Debara Pickett   Subjective   Dyspnea generally unchanged. Also feeling nauseated this AM. Continues to urinate quite a bit. Reports prior dry weight 204lb.  Inpatient Medications    Scheduled Meds: . amLODipine  10 mg Oral Daily  . atorvastatin  20 mg Oral q1800  . doxycycline  100 mg Oral Q12H  . enoxaparin (LOVENOX) injection  40 mg Subcutaneous Q24H  . furosemide  20 mg Intravenous BID  . gabapentin  100 mg Oral BID  . hydrALAZINE  25 mg Oral Q8H  . insulin aspart  0-5 Units Subcutaneous QHS  . insulin aspart  0-9 Units Subcutaneous TID WC  . insulin aspart  15 Units Subcutaneous TID WC  . insulin glargine  60 Units Subcutaneous QPM  . levothyroxine  25 mcg Oral QAC breakfast  . loratadine  10 mg Oral Daily  . metoprolol tartrate  25 mg Oral BID  . oxybutynin  5 mg Oral BH-q7a  . pantoprazole  40 mg Oral Daily  . predniSONE  40 mg Oral Q breakfast  . sodium chloride flush  3 mL Intravenous Q12H   Continuous Infusions:  PRN Meds: acetaminophen **OR** acetaminophen, albuterol, LORazepam, ondansetron **OR** ondansetron (ZOFRAN) IV, traMADol   Vital Signs    Vitals:   05/16/17 1230 05/16/17 2055 05/16/17 2100 05/17/17 0519  BP: (!) 127/53 (!) 144/57  (!) 165/67  Pulse: 71  83 74  Resp: 18 (!) _0 Temp: 98 F (36.7 C) 97.8 F (36.6 C) 97.9 F (36.6 C) 98.5 F (36.9 C)  TempSrc: Oral Oral Oral Oral  SpO2: 90% 93%  94%  Weight:    216 lb 11.2 oz (98.3 kg)  Height:        Intake/Output Summary (Last 24 hours) at 05/17/17 1033 Last data filed at 05/17/17 1012  Gross per 24 hour  Intake             1400 ml  Output             2900 ml  Net            -1500 ml   Filed Weights   05/15/17 0532 05/16/17 0453 05/17/17 0519  Weight: 217 lb 11.2 oz (98.7 kg) 216 lb 6.4 oz (98.2 kg) 216 lb 11.2 oz (98.3 kg)    Telemetry    NSR occ PACs - Personally  Reviewed  Physical Exam    GEN: No acute distress.  HEENT: Normocephalic, atraumatic, sclera non-icteric. Neck: Mildly elevated JVD Cardiac: RRR no murmurs, rubs, or gallops.  Radials/DP/PT 1+ and equal bilaterally.  Respiratory: Diffuse bilateral lower lobe crackes. No wheezes or rhonchi. Breathing is unlabored. GI: Soft, nontender, non-distended, BS +x 4. MS: no deformity. Extremities: No clubbing or cyanosis. 1+ BLE edema. Distal pedal pulses are 2+ and equal bilaterally. Neuro:  AAOx3. Follows commands. Psych:  Responds to questions appropriately with a normal affect.  Labs    Chemistry Recent Labs Lab 05/13/17 [phone] 05/14/17 0334  05/15/17 0527 05/16/17 0233 05/17/17 0159  NA  --  136  < > 136 138 136  K  --  4.9  < > 4.0 3.8 4.2  CL  --  104  < > 106 104 102  CO2  --  24  < > _1 GLUCOSE  --  347*  < > 202* 104* 229*  BUN  --  26*  < >  33* 37* 37*  CREATININE  --  1.05*  < > 1.03* 1.15* 1.08*  CALCIUM  --  8.6*  < > 8.8* 8.4* 8.5*  PROT 5.9* 6.1*  --   --   --   --   ALBUMIN 3.1* 3.0*  --   --   --   --   AST 37 54*  --   --   --   --   ALT 37 54  --   --   --   --   ALKPHOS 72 81  --   --   --   --   BILITOT 0.8 1.1  --   --   --   --   GFRNONAA  --  49*  < > 50* 44* 48*  GFRAA  --  57*  < > 58* 51* 55*  ANIONGAP  --  8  < > _0 < > = values in this interval not displayed.   Hematology Recent Labs Lab 05/15/17 0527 05/16/17 0233 05/17/17 0159  WBC 10.4 12.2* 10.8*  RBC 4.80 4.72 4.49  HGB 14.1 14.2 13.9  HCT 43.8 42.9 40.8  MCV 91.3 90.9 90.9  MCH 29.4 30.1 31.0  MCHC 32.2 33.1 34.1  RDW 14.0 14.0 14.2  PLT 112* 123* 104*    Cardiac Enzymes Recent Labs Lab 05/13/17 0325 05/13/17 0648 05/13/17 0927 05/13/17 1350  TROPONINI <0.03 <0.03 <0.03 <0.03    Recent Labs Lab 05/12/17 2139  TROPIPOC 0.00     BNP Recent Labs Lab 05/12/17 2052  BNP 219.3*     DDimer  Recent Labs Lab 05/13/17 0325  DDIMER 0.81*     Radiology     No results found.  Cardiac Studies   2D Echo 05/13/17 Study Conclusions - Left ventricle: The cavity size was normal. Wall thickness was   normal. Systolic function was normal. The estimated ejection   fraction was in the range of 55% to 60%. Features are consistent   with a pseudonormal left ventricular filling pattern, with   concomitant abnormal relaxation and increased filling pressure   (grade 2 diastolic dysfunction). - Left atrium: The atrium was mildly dilated.  Patient Profile     6F with h/o tobacco abuse,  hypertension, hyperlipidemia, hypothyroidism, liver cirrhosis secondary to NASH, thrombocytopenia, hypothyroidism, prior nephrectomy for renal cell carcinoma, IDDM, obesity, chronic lower extremity edema presented to [facility] with progressive SOB, chest pressure, and weight gain. CTA neg for PE but did show thoracic aortic atherosclerosis and coronary artery calcifications, minimal contrast refluxing into hepatic veins and IVC suggesting elevated right heart pressures, cirrhotic morphology of liver. 2D echo 05/13/17: EF 55-60%, grade 2 DD, mild LAE, RV normal except apex may be hypokinetic.   Assessment & Plan    1. Acute diastolic CHF/RHF compounded by known cirrhosis of liver. Remains volume overloaded. Low dose diuretic chosen given solitary kidney - I wonder if we need to give her a day of increased dosing to help facilitate diuresis further. Will review with Dr. Debara Pickett.   2. Uncontrolled HTN - will review regimen with Dr. Debara Pickett. BP remains elevated this morning; lowest was 127/53 yesterday. Ultimately would like to see her back on ARB due to her CKD and DM, but would not pursue while she is actively being diuresed given her solitary kidney. Could also consider spironolactone down the road given her liver disease but for same reasons as above will hold off. Will also check UA to evaluate degree of  proteinuria given persistent HTN despite several agents.  3. Acute kidney injury  superimposed on probable CKD II-III (prior Cr 0.9-1.0) - appears improved from admission.   4. Coronary artery calcification on CT - troponins negative this admission. EKG nonacute. Dr. Debara Pickett did not feel this episode was ischemically driven. Could consider OP nuc when improved. Continue risk factor reduction.  5. Hyperlipidemia - started on statin this admission. With known liver disease would recommend close monitoring of LFTs.  Signed, Charlie Pitter, PA-C  05/17/2017, 10:33 AM

## 2017-05-17 NOTE — Telephone Encounter (Signed)
appt was already scheduled
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
## 2017-05-17 NOTE — Progress Notes (Signed)
Pt educated about safety and importance of bed alarm during the night however pt refuses to be on bed alarm. Will continue to round on patient.   Glenetta Kiger, RN    

## 2017-05-17 NOTE — Progress Notes (Signed)
PROGRESS NOTE    Margaret Dyer  THY:[id number] DOB: 07/31/1935 DOA: 05/13/2017 PCP: Ann Held, DO   Outpatient Specialists:     Brief Narrative:  Margaret Dyer is a 82 y.o. female with a Past Medical History significant for nephrectomy, diabetes, hypothyroidism, liver cirrhosis who presents with dyspnea on exertion. Trial of PO steroids, IV lasix, and breathing treatment.   Appears to be very anxious and responding only partially to steroids/diuresis.  Patient still SOB and now hypoxic despite IV diuresis.  Appreciate both cardiology and pulmonology consultations.    Assessment & Plan:   Principal Problem:   DOE (dyspnea on exertion) Active Problems:   Hypertension   Asthma   Diabetes mellitus type 2, insulin dependent (HCC)   Acute kidney injury ([place])   History of nephrectomy   HLD (hyperlipidemia)   Liver cirrhosis secondary to NASH (HCC)   Thrombocytopenia (HCC)   Renal cell carcinoma   Hypothyroidism   Hypersplenism   Osteoarthritis   Overactive bladder   Dyspnea   Acute diastolic CHF (congestive heart failure) (HCC)   Right heart failure (HCC)   DOE (dyspnea on exertion)/chronic lower extremity edema- unclear etiology volume overload +/- interstitial lung disease -Patient presents with 2 weeks duration significant dyspnea on exertion in setting of chronic lower extremity edema -+ cough: added Claritin and PPI and cough has resolved -Echocardiogram shows diastolic dsfxn and CE negative -RA ambulatory pulse oximetry negative on day of admission -D dimer elevated-No PE on CTA -x ray suggestive of interstitial lung disease -doxy started in ER along with prednisone with no improvement in the subjective SOB -nebs -h/o childhood asthma -cardiology consult-- increased IV diuretic and may need RHC -pulmonology consult  Acute on chronic Diastolic CHF -IV lasix -weight increased by 16lbs from last office visit- reports dry weight around 200lbs -I/os:  negative 1.5L    Acute kidney injury/ History of nephrectomy 2/2 Renal cell carcinoma (2000) -Baseline renal function (2016): 25 and 0.8 -started lasix: grade 2 diastolic dsfnx on echo -daily BMP    Hypertension -Holding ARB as above -Continue Lopressor and Norvasc    Diabetes mellitus type 2, insulin dependent - uncontrolled -add novolog with meals - Lantus -SSI -HgbA1c: 8.8-- improved from 1 year ago but still not controlled    HLD (hyperlipidemia) -added statin for LDL > 143    Liver cirrhosis secondary to NASH  -LFTs are within normal limits -CT suggestive of mildly elevated right heart pressures and this could be 2/2 cirrhotic changes    Thrombocytopenia/Hypersplenism -Chronic issue -CBC daily    Hypothyroidism -Continue Synthroid - TSH ok    Osteoarthritis -Continue preadmission Ultram/LFTs are stable    Overactive bladder -Continue oxybutynin   DVT prophylaxis:  Lovenox   Code Status: Full Code   Family Communication: Daughter at bedside  Disposition Plan:  Home once work up complete   Consultants:   Cards  pulm     Subjective: Unable to lay flat last PM-- still feeling short of breath at rest and with exertion  Objective: Vitals:   05/16/17 2055 05/16/17 2100 05/17/17 0519 05/17/17 1227  BP: (!) 144/57  (!) 165/67   Pulse:  83 74 92  Resp: (!) _0 Temp: 97.8 F (36.6 C) 97.9 F (36.6 C) 98.5 F (36.9 C) 98.6 F (37 C)  TempSrc: Oral Oral Oral Oral  SpO2: 93%  94% (!) 88%  Weight:   98.3 kg (216 lb 11.2 oz)   Height:  Intake/Output Summary (Last 24 hours) at 05/17/17 1357 Last data filed at 05/17/17 1012  Gross per 24 hour  Intake             1080 ml  Output             2700 ml  Net            -1620 ml   Filed Weights   05/15/17 0532 05/16/17 0453 05/17/17 0519  Weight: 98.7 kg (217 lb 11.2 oz) 98.2 kg (216 lb 6.4 oz) 98.3 kg (216 lb 11.2 oz)    Examination:  General exam: in bed, NAD- able  to speak in complete sentences Respiratory system: no wheezing, crackles Cardiovascular system: rrr--  + LE edema Gastrointestinal system: +BS, NT Central nervous system: alert, oriented x 3 Extremities: bruising on left arm Skin: continued improvement of bruising    Data Reviewed: I have personally reviewed following labs and imaging studies  CBC:  Recent Labs Lab 05/12/17 2052 05/14/17 0334 05/15/17 0527 05/16/17 0233 05/17/17 0159  WBC 6.7 7.2 10.4 12.2* 10.8*  HGB 14.6 14.3 14.1 14.2 13.9  HCT 45.0 42.3 43.8 42.9 40.8  MCV 91.3 89.1 91.3 90.9 90.9  PLT 120* 113* 112* 123* 076*   Basic Metabolic Panel:  Recent Labs Lab 05/13/17 0927 05/14/17 0334 05/14/17 2349 05/15/17 0527 05/16/17 0233 05/17/17 0159  NA  --  136 133* 136 138 136  K  --  4.9 4.5 4.0 3.8 4.2  CL  --  104 103 106 104 102  CO2  --  _0 GLUCOSE  --  347* 346* 202* 104* 229*  BUN  --  26* 33* 33* 37* 37*  CREATININE  --  1.05* 1.27* 1.03* 1.15* 1.08*  CALCIUM  --  8.6* 8.8* 8.8* 8.4* 8.5*  MG 1.8  --   --   --   --   --   PHOS 3.5  --   --   --   --   --    GFR: Estimated Creatinine Clearance: 44.9 mL/min (A) (by C-G formula based on SCr of 1.08 mg/dL (H)). Liver Function Tests:  Recent Labs Lab 05/13/17 0648 05/14/17 0334  AST 37 54*  ALT 37 54  ALKPHOS 72 81  BILITOT 0.8 1.1  PROT 5.9* 6.1*  ALBUMIN 3.1* 3.0*   No results for input(s): LIPASE, AMYLASE in the last 168 hours. No results for input(s): AMMONIA in the last 168 hours. Coagulation Profile: No results for input(s): INR, PROTIME in the last 168 hours. Cardiac Enzymes:  Recent Labs Lab 05/13/17 0325 05/13/17 0648 05/13/17 0927 05/13/17 1350  TROPONINI <0.03 <0.03 <0.03 <0.03   BNP (last 3 results) No results for input(s): PROBNP in the last 8760 hours. HbA1C: No results for input(s): HGBA1C in the last 72 hours. CBG:  Recent Labs Lab 05/16/17 2206 05/17/17 0743 05/17/17 1125 05/17/17 1202  05/17/17 1216  GLUCAP 252* 149* 44* 126* 157*   Lipid Profile: No results for input(s): CHOL, HDL, LDLCALC, TRIG, CHOLHDL, LDLDIRECT in the last 72 hours. Thyroid Function Tests: No results for input(s): TSH, T4TOTAL, FREET4, T3FREE, THYROIDAB in the last 72 hours. Anemia Panel: No results for input(s): VITAMINB12, FOLATE, FERRITIN, TIBC, IRON, RETICCTPCT in the last 72 hours. Urine analysis:    Component Value Date/Time   COLORURINE YELLOW 05/17/2017 [place] 05/17/2017 1212   LABSPEC 1.010 05/17/2017 1212   PHURINE 5.0 05/17/2017 1212   GLUCOSEU NEGATIVE 05/17/2017  [place] 05/17/2017 1212   HGBUR large 02/17/2011 1432   BILIRUBINUR NEGATIVE 05/17/2017 1212   BILIRUBINUR Neg 02/19/2015 1512   [place] 05/17/2017 1212   PROTEINUR 100 (A) 05/17/2017 1212   UROBILINOGEN 2.0 02/19/2015 1512   UROBILINOGEN 0.2 02/17/2011 1432   NITRITE NEGATIVE 05/17/2017 1212   LEUKOCYTESUR NEGATIVE 05/17/2017 1212     )No results found for this or any previous visit (from the past 240 hour(s)).    Anti-infectives    Start     Dose/Rate Route Frequency Ordered Stop   05/13/17 1430  doxycycline (VIBRA-TABS) tablet 100 mg     100 mg Oral Every 12 hours 05/13/17 1426         Radiology Studies: No results found.      Scheduled Meds: . amLODipine  10 mg Oral Daily  . atorvastatin  20 mg Oral q1800  . doxycycline  100 mg Oral Q12H  . enoxaparin (LOVENOX) injection  40 mg Subcutaneous Q24H  . furosemide  20 mg Intravenous STAT  . furosemide  40 mg Intravenous BID  . gabapentin  100 mg Oral BID  . hydrALAZINE  25 mg Oral Q8H  . insulin aspart  0-5 Units Subcutaneous QHS  . insulin aspart  0-9 Units Subcutaneous TID WC  . insulin aspart  15 Units Subcutaneous TID WC  . insulin glargine  60 Units Subcutaneous QPM  . levothyroxine  25 mcg Oral QAC breakfast  . loratadine  10 mg Oral Daily  . metoprolol tartrate  25 mg Oral BID  . oxybutynin   5 mg Oral BH-q7a  . pantoprazole  40 mg Oral Daily  . predniSONE  40 mg Oral Q breakfast  . sodium chloride flush  3 mL Intravenous Q12H   Continuous Infusions:   LOS: 3 days    Time spent: 35 min    [place], DO [facility] Pager [phone]  If 7PM-7AM, please contact night-coverage [email or web address] Password [email or web address] 05/17/2017, 1:57 PM

## 2017-05-17 NOTE — Progress Notes (Signed)
Pt still SOB despite 2 liters of o2 placed.  MD notified, pulmonology to come see pt.  Will continue to monitor.

## 2017-05-18 ENCOUNTER — Encounter (HOSPITAL_COMMUNITY)
Admission: EM | Disposition: A | Discharge status: Home / Self Care | Payer: Self-pay | Source: Home / Self Care | Attending: Internal Medicine

## 2017-05-18 ENCOUNTER — Telehealth: Payer: Self-pay | Admitting: Family Medicine

## 2017-05-18 DIAGNOSIS — I5033 Acute on chronic diastolic (congestive) heart failure: Principal | ICD-10-CM

## 2017-05-18 LAB — BASIC METABOLIC PANEL
Anion gap: 8 (ref 5–15)
BUN: 45 mg/dL — AB (ref 6–20)
CHLORIDE: 97 mmol/L — AB (ref 101–111)
CO2: 28 mmol/L (ref 22–32)
CREATININE: 1.22 mg/dL — AB (ref 0.44–1.00)
Calcium: 8 mg/dL — ABNORMAL LOW (ref 8.9–10.3)
GFR calc Af Amer: 47 mL/min — ABNORMAL LOW (ref >60)
GFR, EST NON AFRICAN AMERICAN: 41 mL/min — AB (ref >60)
GLUCOSE: 336 mg/dL — AB (ref 65–99)
POTASSIUM: 4.3 mmol/L (ref 3.5–5.1)
Sodium: 133 mmol/L — ABNORMAL LOW (ref 135–145)

## 2017-05-18 LAB — GLUCOSE, CAPILLARY
GLUCOSE-CAPILLARY: 229 mg/dL — AB (ref 65–99)
GLUCOSE-CAPILLARY: 167 mg/dL — AB (ref 65–99)
GLUCOSE-CAPILLARY: 196 mg/dL — AB (ref 65–99)
GLUCOSE-CAPILLARY: 242 mg/dL — AB (ref 65–99)
Glucose-Capillary: 222 mg/dL — ABNORMAL HIGH (ref 65–99)
Glucose-Capillary: 272 mg/dL — ABNORMAL HIGH (ref 65–99)

## 2017-05-18 LAB — PROTIME-INR
INR: 1.17
Prothrombin Time: 15 seconds (ref 11.4–15.2)

## 2017-05-18 SURGERY — RIGHT HEART CATH AND CORONARY ANGIOGRAPHY
Anesthesia: LOCAL

## 2017-05-18 MED ORDER — SODIUM CHLORIDE 0.9% FLUSH
3.0000 mL | Freq: Two times a day (BID) | INTRAVENOUS | Status: DC
  Administered 2017-05-18: 3 mL via INTRAVENOUS

## 2017-05-18 MED ORDER — SODIUM CHLORIDE 0.9 % IV SOLN: 250.0000 mL | INTRAVENOUS | Status: DC | PRN

## 2017-05-18 MED ORDER — SODIUM CHLORIDE 0.9% FLUSH: 3.0000 mL | INTRAVENOUS | Status: DC | PRN

## 2017-05-18 MED ORDER — SODIUM CHLORIDE 0.9 % IV SOLN
INTRAVENOUS | Status: DC
Start: 2017-05-18 — End: 2017-05-18
  Administered 2017-05-18: 14:00:00 via INTRAVENOUS

## 2017-05-18 MED ORDER — SODIUM CHLORIDE 0.9% FLUSH
3.0000 mL | Freq: Two times a day (BID) | INTRAVENOUS | Status: DC
  Administered 2017-05-18 – 2017-05-23 (×4): 3 mL via INTRAVENOUS

## 2017-05-18 MED ORDER — FUROSEMIDE 10 MG/ML IJ SOLN
80.0000 mg | Freq: Two times a day (BID) | INTRAMUSCULAR | Status: DC
  Administered 2017-05-18 – 2017-05-23 (×10): 80 mg via INTRAVENOUS
  Filled 2017-05-18 (×10): qty 8

## 2017-05-18 MED ORDER — LIDOCAINE HCL (PF) 1 % IJ SOLN
INTRAMUSCULAR | Status: AC
  Filled 2017-05-18: qty 30

## 2017-05-18 MED ORDER — PREDNISONE 20 MG PO TABS
20.0000 mg | ORAL_TABLET | Freq: Every day | ORAL | Status: DC
  Administered 2017-05-18: 20 mg via ORAL
  Filled 2017-05-18: qty 1

## 2017-05-18 MED ORDER — HEPARIN (PORCINE) IN NACL 2-0.9 UNIT/ML-% IJ SOLN
INTRAMUSCULAR | Status: AC
  Filled 2017-05-18: qty 500

## 2017-05-18 MED ORDER — HEPARIN (PORCINE) IN NACL 2-0.9 UNIT/ML-% IJ SOLN
INTRAMUSCULAR | Status: AC | PRN
  Administered 2017-05-18: 500 mL

## 2017-05-18 MED ORDER — LIDOCAINE HCL (PF) 1 % IJ SOLN
INTRAMUSCULAR | Status: DC | PRN
  Administered 2017-05-18: 2 mL

## 2017-05-18 SURGICAL SUPPLY — 8 items
CATH BALLN WEDGE 5F 110CM (CATHETERS) ×2 IMPLANT
PACK CARDIAC CATHETERIZATION (CUSTOM PROCEDURE TRAY) ×2 IMPLANT
PROTECTION STATION PRESSURIZED (MISCELLANEOUS) ×2
SHEATH GLIDE SLENDER 4/5FR (SHEATH) ×2 IMPLANT
STATION PROTECTION PRESSURIZED (MISCELLANEOUS) ×1 IMPLANT
TRANSDUCER W/STOPCOCK (MISCELLANEOUS) ×2 IMPLANT
TUBING ART PRESS 72  MALE/FEM (TUBING) ×1
TUBING ART PRESS 72 MALE/FEM (TUBING) ×1 IMPLANT

## 2017-05-18 NOTE — Progress Notes (Signed)
Called cath lab asked if pt was on schedule at this time, cath lab staff denied   Perkins Molina Leory Plowman
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
## 2017-05-18 NOTE — Progress Notes (Signed)
Patient refused bed alarm. Will continue to monitor patient. 
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
## 2017-05-18 NOTE — Progress Notes (Signed)
Inpatient Diabetes Program Recommendations  AACE/ADA: New Consensus Statement on Inpatient Glycemic Control (2015)  Target Ranges:  Prepandial:   less than 140 mg/dL      Peak postprandial:   less than 180 mg/dL (1-2 hours)      Critically ill patients:  140 - 180 mg/dL   Lab Results  Component Value Date   GLUCAP 229 (H) 05/18/2017   HGBA1C 8.8 (H) 05/13/2017    Review of Glycemic Control:  Results for Margaret Dyer, Margaret Dyer (MRN [id number]) as of 05/18/2017 11:31  Ref. Range 05/17/2017 07:43 05/17/2017 11:25 05/17/2017 12:02 05/17/2017 12:16 05/17/2017 15:00 05/17/2017 16:23 05/17/2017 21:36 05/18/2017 01:23 05/18/2017 07:35  Glucose-Capillary Latest Ref Range: 65 - 99 mg/dL 149 (H) 44 (LL) 126 (H) 157 (H) 222 (H) 281 (H) 257 (H) 272 (H) 229 (H)   Current orders for Inpatient glycemic control: Lantus 60 units daily, Novolog sensitive tid with meals and HS, Novolog 15 units tid with meals Prednisone 20 mg daily (decreased today 05/18/17) Inpatient Diabetes Program Recommendations:   Please consider reducing Novolog to 12 units tid with meals (hold if patient eats less than 50%).  Thanks, Adah Perl, RN, BC-ADM Inpatient Diabetes Coordinator Pager [phone] (8a-5p)

## 2017-05-18 NOTE — Progress Notes (Signed)
Cardiology called and stated pt will go for cardiac cath this afternoon and to inform pt, pt aware

## 2017-05-18 NOTE — Progress Notes (Signed)
Paged cardiology regarding pt NPO status for clarification with medication administration, awaiting call back

## 2017-05-18 NOTE — H&P (View-Only) (Signed)
Progress Note  Patient Name: Margaret Dyer Date of Encounter: 05/18/2017  Primary Cardiologist: Dr. Debara Pickett   Subjective   Feels better at rest while using oxygen, able to lie flat and still without dyspnea. However, she states that she feels SOB with even minimal activity. Today is her 82th birthday!  Inpatient Medications    Scheduled Meds: . amLODipine  10 mg Oral Daily  . atorvastatin  20 mg Oral q1800  . doxycycline  100 mg Oral Q12H  . enoxaparin (LOVENOX) injection  40 mg Subcutaneous Q24H  . furosemide  40 mg Intravenous BID  . gabapentin  100 mg Oral BID  . hydrALAZINE  25 mg Oral Q8H  . insulin aspart  0-5 Units Subcutaneous QHS  . insulin aspart  0-9 Units Subcutaneous TID WC  . insulin aspart  15 Units Subcutaneous TID WC  . insulin glargine  60 Units Subcutaneous QPM  . levothyroxine  25 mcg Oral QAC breakfast  . loratadine  10 mg Oral Daily  . metoprolol tartrate  25 mg Oral BID  . oxybutynin  5 mg Oral BH-q7a  . pantoprazole  40 mg Oral Daily  . predniSONE  20 mg Oral Q breakfast  . sodium chloride flush  3 mL Intravenous Q12H   Continuous Infusions:  PRN Meds: acetaminophen **OR** acetaminophen, albuterol, antiseptic oral rinse, lip balm, LORazepam, ondansetron **OR** ondansetron (ZOFRAN) IV, traMADol   Vital Signs    Vitals:   05/17/17 2207 05/18/17 0504 05/18/17 0633 05/18/17 0733  BP: (!) 140/50 (!) 128/50 (!) 134/52 (!) 132/53  Pulse: 77 66 (!) 56 62  Resp:  18  18  Temp:  97.7 F (36.5 C)    TempSrc:  Oral    SpO2: 94% 95%  98%  Weight:  214 lb 12.8 oz (97.4 kg)    Height:        Intake/Output Summary (Last 24 hours) at 05/18/17 1139 Last data filed at 05/18/17 0953  Gross per 24 hour  Intake              840 ml  Output             1550 ml  Net             -710 ml   Filed Weights   05/16/17 0453 05/17/17 0519 05/18/17 0504  Weight: 216 lb 6.4 oz (98.2 kg) 216 lb 11.2 oz (98.3 kg) 214 lb 12.8 oz (97.4 kg)    Telemetry    NSR occ  PACs - Personally Reviewed  Physical Exam       GEN: No acute distress.  HEENT: Normocephalic, atraumatic, sclera non-icteric. Neck:Mildly elevated JVD Cardiac:RRR no murmurs, rubs, or gallops.  Radials/DP/PT 1+ and equal bilaterally.  Respiratory:Decreased BS at bases with coarse BS, but improved from yesterday. No wheezes or rhonchi. Breathing is unlabored. UX:LKGM, nontender, non-distended, BS +x 4. MS:no deformity. Extremities: No clubbing or cyanosis. 1+ BLE edema but slightly improved from yesterday. Distal pedal pulses are 2+ and equal bilaterally. Neuro:AAOx3. Follows commands. Psych:  Responds to questions appropriately with a normal affect.  Labs    Chemistry Recent Labs Lab 05/13/17 [phone] 05/14/17 0334  05/16/17 0233 05/17/17 0159 05/18/17 0323  NA  --  136  < > 138 136 133*  K  --  4.9  < > 3.8 4.2 4.3  CL  --  104  < > 104 102 97*  CO2  --  24  < > 25 26 28  GLUCOSE  --  347*  < > 104* 229* 336*  BUN  --  26*  < > 37* 37* 45*  CREATININE  --  1.05*  < > 1.15* 1.08* 1.22*  CALCIUM  --  8.6*  < > 8.4* 8.5* 8.0*  PROT 5.9* 6.1*  --   --   --   --   ALBUMIN 3.1* 3.0*  --   --   --   --   AST 37 54*  --   --   --   --   ALT 37 54  --   --   --   --   ALKPHOS 72 81  --   --   --   --   BILITOT 0.8 1.1  --   --   --   --   GFRNONAA  --  49*  < > 44* 48* 41*  GFRAA  --  57*  < > 51* 55* 47*  ANIONGAP  --  8  < > _0 < > = values in this interval not displayed.   Hematology Recent Labs Lab 05/15/17 0527 05/16/17 0233 05/17/17 0159  WBC 10.4 12.2* 10.8*  RBC 4.80 4.72 4.49  HGB 14.1 14.2 13.9  HCT 43.8 42.9 40.8  MCV 91.3 90.9 90.9  MCH 29.4 30.1 31.0  MCHC 32.2 33.1 34.1  RDW 14.0 14.0 14.2  PLT 112* 123* 104*    Cardiac Enzymes Recent Labs Lab 05/13/17 0325 05/13/17 0648 05/13/17 0927 05/13/17 1350  TROPONINI <0.03 <0.03 <0.03 <0.03    Recent Labs Lab 05/12/17 2139  TROPIPOC 0.00     BNP Recent Labs Lab 05/12/17 2052    BNP 219.3*     DDimer  Recent Labs Lab 05/13/17 0325  DDIMER 0.81*     Radiology    No results found.  Cardiac Studies   2D Echo 05/13/17 Study Conclusions - Left ventricle: The cavity size was normal. Wall thickness was normal. Systolic function was normal. The estimated ejection fraction was in the range of 55% to 60%. Features are consistent with a pseudonormal left ventricular filling pattern, with concomitant abnormal relaxation and increased filling pressure (grade 2 diastolic dysfunction). - Left atrium: The atrium was mildly dilated.  Patient Profile     82 y.o. female with h/o tobacco abuse, hypertension, hyperlipidemia, hypothyroidism, liver cirrhosis secondary to NASH, thrombocytopenia, hypothyroidism, prior nephrectomy for renal cell carcinoma, IDDM, obesity, chronic lower extremity edema presented to [facility] with progressive SOB, chest pressure, and weight gain. CTA neg for PE but did show thoracic aortic atherosclerosis and coronary artery calcifications, minimal contrast refluxing into hepatic veins and IVC suggesting elevated right heart pressures, cirrhotic morphology of liver. 2D echo 05/13/17: EF 55-60%, grade 2 DD, mild LAE, RV normal except apex may be hypokinetic per Dr. Lysbeth Penner read.   Assessment & Plan    1. Acute diastolic CHF/RHF compounded by known cirrhosis of liver  - also with hypoxia intermittently - Remains volume overloaded. Dry weight around 204lb. BUN/Cr bumped with increase in Lasix, but she did diurese. Per d/w MD, plan RHC today to further evaluate. Risks and benefits of cardiac catheterization have been discussed with the patient. These include bleeding, infection, kidney damage, stroke, heart attack, death. The patient understands these risks and is willing to proceed. She would like to know timinig of case - spoke with lab and she is on add-on board so we do not have an exact time, but cath lab told me  they would call the floor to inform  the nurse when they are 2 cases ahead.  2. Uncontrolled HTN - BP improved.  3. 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 Plan RHC today.  4. Coronary artery calcification on CT - troponins negative this admission. EKG nonacute. Dr. Debara Pickett did not feel this episode was ischemically driven. Could consider OP nuc when improved, or inpatient if dyspnea does not totally improve. Continue risk factor reduction.  5. Hyperlipidemia - started on statin this admission. With known liver disease would recommend close monitoring of LFTs.  Signed, Charlie Pitter, PA-C  05/18/2017, 11:39 AM

## 2017-05-18 NOTE — Interval H&P Note (Signed)
History and Physical Interval Note:  05/18/2017 3:52 PM  Margaret Dyer  has presented today for surgery, with the diagnosis of acute diastolic heart failure.  The various methods of treatment have been discussed with the patient and family. After consideration of risks, benefits and other options for treatment, the patient has consented to  Procedure(s): Right Heart Cath and Coronary Angiography (N/A) as a surgical intervention .  The patient's history has been reviewed, patient examined, no change in status, stable for surgery.  I have reviewed the patient's chart and labs.  Questions were answered to the patient's satisfaction.     Glenetta Hew

## 2017-05-18 NOTE — Progress Notes (Signed)
Noted message for letter for the pt not to testify in [place]; [place] called patient's daughter [name] to determine the date that the patient is to testify in court- July 06/09/2017; CM informed daughter that the hospital cannot write her a letter at this time; if she need a letter close to that date, she will have to contact her PCP and they will determine if she is able to testify in court. Lots of emotional support given; SW updatedAneta Mins [phone]

## 2017-05-18 NOTE — Progress Notes (Signed)
Created pt password with pt and family at bedside Pt signed consent and prepped by nurse tech

## 2017-05-18 NOTE — Progress Notes (Signed)
Progress Note  Patient Name: Margaret Dyer Date of Encounter: 05/18/2017  Primary Cardiologist: Dr. Debara Pickett   Subjective   Feels better at rest while using oxygen, able to lie flat and still without dyspnea. However, she states that she feels SOB with even minimal activity. Today is her 82th birthday!  Inpatient Medications    Scheduled Meds: . amLODipine  10 mg Oral Daily  . atorvastatin  20 mg Oral q1800  . doxycycline  100 mg Oral Q12H  . enoxaparin (LOVENOX) injection  40 mg Subcutaneous Q24H  . furosemide  40 mg Intravenous BID  . gabapentin  100 mg Oral BID  . hydrALAZINE  25 mg Oral Q8H  . insulin aspart  0-5 Units Subcutaneous QHS  . insulin aspart  0-9 Units Subcutaneous TID WC  . insulin aspart  15 Units Subcutaneous TID WC  . insulin glargine  60 Units Subcutaneous QPM  . levothyroxine  25 mcg Oral QAC breakfast  . loratadine  10 mg Oral Daily  . metoprolol tartrate  25 mg Oral BID  . oxybutynin  5 mg Oral BH-q7a  . pantoprazole  40 mg Oral Daily  . predniSONE  20 mg Oral Q breakfast  . sodium chloride flush  3 mL Intravenous Q12H   Continuous Infusions:  PRN Meds: acetaminophen **OR** acetaminophen, albuterol, antiseptic oral rinse, lip balm, LORazepam, ondansetron **OR** ondansetron (ZOFRAN) IV, traMADol   Vital Signs    Vitals:   05/17/17 2207 05/18/17 0504 05/18/17 0633 05/18/17 0733  BP: (!) 140/50 (!) 128/50 (!) 134/52 (!) 132/53  Pulse: 77 66 (!) 56 62  Resp:  18  18  Temp:  97.7 F (36.5 C)    TempSrc:  Oral    SpO2: 94% 95%  98%  Weight:  214 lb 12.8 oz (97.4 kg)    Height:        Intake/Output Summary (Last 24 hours) at 05/18/17 1139 Last data filed at 05/18/17 0953  Gross per 24 hour  Intake              840 ml  Output             1550 ml  Net             -710 ml   Filed Weights   05/16/17 0453 05/17/17 0519 05/18/17 0504  Weight: 216 lb 6.4 oz (98.2 kg) 216 lb 11.2 oz (98.3 kg) 214 lb 12.8 oz (97.4 kg)    Telemetry    NSR occ  PACs - Personally Reviewed  Physical Exam       GEN: No acute distress.  HEENT: Normocephalic, atraumatic, sclera non-icteric. Neck:Mildly elevated JVD Cardiac:RRR no murmurs, rubs, or gallops.  Radials/DP/PT 1+ and equal bilaterally.  Respiratory:Decreased BS at bases with coarse BS, but improved from yesterday. No wheezes or rhonchi. Breathing is unlabored. UX:LKGM, nontender, non-distended, BS +x 4. MS:no deformity. Extremities: No clubbing or cyanosis. 1+ BLE edema but slightly improved from yesterday. Distal pedal pulses are 2+ and equal bilaterally. Neuro:AAOx3. Follows commands. Psych:  Responds to questions appropriately with a normal affect.  Labs    Chemistry Recent Labs Lab 05/13/17 [phone] 05/14/17 0334  05/16/17 0233 05/17/17 0159 05/18/17 0323  NA  --  136  < > 138 136 133*  K  --  4.9  < > 3.8 4.2 4.3  CL  --  104  < > 104 102 97*  CO2  --  24  < > 25 26 28  GLUCOSE  --  347*  < > 104* 229* 336*  BUN  --  26*  < > 37* 37* 45*  CREATININE  --  1.05*  < > 1.15* 1.08* 1.22*  CALCIUM  --  8.6*  < > 8.4* 8.5* 8.0*  PROT 5.9* 6.1*  --   --   --   --   ALBUMIN 3.1* 3.0*  --   --   --   --   AST 37 54*  --   --   --   --   ALT 37 54  --   --   --   --   ALKPHOS 72 81  --   --   --   --   BILITOT 0.8 1.1  --   --   --   --   GFRNONAA  --  49*  < > 44* 48* 41*  GFRAA  --  57*  < > 51* 55* 47*  ANIONGAP  --  8  < > _0 < > = values in this interval not displayed.   Hematology Recent Labs Lab 05/15/17 0527 05/16/17 0233 05/17/17 0159  WBC 10.4 12.2* 10.8*  RBC 4.80 4.72 4.49  HGB 14.1 14.2 13.9  HCT 43.8 42.9 40.8  MCV 91.3 90.9 90.9  MCH 29.4 30.1 31.0  MCHC 32.2 33.1 34.1  RDW 14.0 14.0 14.2  PLT 112* 123* 104*    Cardiac Enzymes Recent Labs Lab 05/13/17 0325 05/13/17 0648 05/13/17 0927 05/13/17 1350  TROPONINI <0.03 <0.03 <0.03 <0.03    Recent Labs Lab 05/12/17 2139  TROPIPOC 0.00     BNP Recent Labs Lab 05/12/17 2052    BNP 219.3*     DDimer  Recent Labs Lab 05/13/17 0325  DDIMER 0.81*     Radiology    No results found.  Cardiac Studies   2D Echo 05/13/17 Study Conclusions - Left ventricle: The cavity size was normal. Wall thickness was normal. Systolic function was normal. The estimated ejection fraction was in the range of 55% to 60%. Features are consistent with a pseudonormal left ventricular filling pattern, with concomitant abnormal relaxation and increased filling pressure (grade 2 diastolic dysfunction). - Left atrium: The atrium was mildly dilated.  Patient Profile     82 y.o. female with h/o tobacco abuse, hypertension, hyperlipidemia, hypothyroidism, liver cirrhosis secondary to NASH, thrombocytopenia, hypothyroidism, prior nephrectomy for renal cell carcinoma, IDDM, obesity, chronic lower extremity edema presented to [facility] with progressive SOB, chest pressure, and weight gain. CTA neg for PE but did show thoracic aortic atherosclerosis and coronary artery calcifications, minimal contrast refluxing into hepatic veins and IVC suggesting elevated right heart pressures, cirrhotic morphology of liver. 2D echo 05/13/17: EF 55-60%, grade 2 DD, mild LAE, RV normal except apex may be hypokinetic per Dr. Lysbeth Penner read.   Assessment & Plan    1. Acute diastolic CHF/RHF compounded by known cirrhosis of liver  - also with hypoxia intermittently - Remains volume overloaded. Dry weight around 204lb. BUN/Cr bumped with increase in Lasix, but she did diurese. Per d/w MD, plan RHC today to further evaluate. Risks and benefits of cardiac catheterization have been discussed with the patient. These include bleeding, infection, kidney damage, stroke, heart attack, death. The patient understands these risks and is willing to proceed. She would like to know timinig of case - spoke with lab and she is on add-on board so we do not have an exact time, but cath lab told me  they would call the floor to inform  the nurse when they are 2 cases ahead.  2. Uncontrolled HTN - BP improved.  3. 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 Plan RHC today.  4. Coronary artery calcification on CT - troponins negative this admission. EKG nonacute. Dr. Debara Pickett did not feel this episode was ischemically driven. Could consider OP nuc when improved, or inpatient if dyspnea does not totally improve. Continue risk factor reduction.  5. Hyperlipidemia - started on statin this admission. With known liver disease would recommend close monitoring of LFTs.  Signed, Charlie Pitter, PA-C  05/18/2017, 11:39 AM
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
## 2017-05-18 NOTE — Progress Notes (Signed)
Paged MD regarding NPO order does not clarify medication administration MD placed new order
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
## 2017-05-19 DIAGNOSIS — E119 Type 2 diabetes mellitus without complications: Secondary | ICD-10-CM

## 2017-05-19 DIAGNOSIS — Z794 Long term (current) use of insulin: Secondary | ICD-10-CM

## 2017-05-19 LAB — GLUCOSE, CAPILLARY
GLUCOSE-CAPILLARY: 174 mg/dL — AB (ref 65–99)
GLUCOSE-CAPILLARY: 156 mg/dL — AB (ref 65–99)
Glucose-Capillary: 54 mg/dL — ABNORMAL LOW (ref 65–99)
Glucose-Capillary: 73 mg/dL (ref 65–99)
Glucose-Capillary: 200 mg/dL — ABNORMAL HIGH (ref 65–99)
Glucose-Capillary: 145 mg/dL — ABNORMAL HIGH (ref 65–99)

## 2017-05-19 LAB — COMPREHENSIVE METABOLIC PANEL
ALBUMIN: 2.6 g/dL — AB (ref 3.5–5.0)
ALT: 36 U/L (ref 14–54)
AST: 29 U/L (ref 15–41)
Alkaline Phosphatase: 66 U/L (ref 38–126)
Anion gap: 10 (ref 5–15)
BUN: 49 mg/dL — AB (ref 6–20)
CHLORIDE: 99 mmol/L — AB (ref 101–111)
CO2: 30 mmol/L (ref 22–32)
CREATININE: 1.12 mg/dL — AB (ref 0.44–1.00)
Calcium: 8.2 mg/dL — ABNORMAL LOW (ref 8.9–10.3)
GFR calc Af Amer: 52 mL/min — ABNORMAL LOW (ref >60)
GFR calc non Af Amer: 45 mL/min — ABNORMAL LOW (ref >60)
Glucose, Bld: 109 mg/dL — ABNORMAL HIGH (ref 65–99)
POTASSIUM: 3.9 mmol/L (ref 3.5–5.1)
SODIUM: 139 mmol/L (ref 135–145)
Total Bilirubin: 0.8 mg/dL (ref 0.3–1.2)
Total Protein: 5.9 g/dL — ABNORMAL LOW (ref 6.5–8.1)

## 2017-05-19 LAB — CBC
HCT: 43.4 % (ref 36.0–46.0)
Hemoglobin: 14.3 g/dL (ref 12.0–15.0)
MCH: 30.2 pg (ref 26.0–34.0)
MCHC: 32.9 g/dL (ref 30.0–36.0)
MCV: 91.6 fL (ref 78.0–100.0)
PLATELETS: 146 10*3/uL — AB (ref 150–400)
RBC: 4.74 MIL/uL (ref 3.87–5.11)
RDW: 13.9 % (ref 11.5–15.5)
WBC: 12.2 10*3/uL — AB (ref 4.0–10.5)

## 2017-05-19 MED ORDER — PREDNISONE 10 MG PO TABS
10.0000 mg | ORAL_TABLET | Freq: Every day | ORAL | Status: DC
  Administered 2017-05-19 – 2017-05-20 (×2): 10 mg via ORAL
  Filled 2017-05-19 (×2): qty 1

## 2017-05-19 MED ORDER — PRO-STAT SUGAR FREE PO LIQD
30.0000 mL | Freq: Two times a day (BID) | ORAL | Status: DC
  Administered 2017-05-19 – 2017-05-23 (×4): 30 mL via ORAL
  Filled 2017-05-19 (×10): qty 30

## 2017-05-19 NOTE — Progress Notes (Signed)
Paged cardiology regarding pt increased BUN and 0800 lasix dose Awaiting call back
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
## 2017-05-19 NOTE — Progress Notes (Signed)
Cardiology aware of morning lab values stated okay to give lasix

## 2017-05-19 NOTE — Progress Notes (Signed)
DAILY PROGRESS NOTE   Patient Name: ALLEIGH Dyer Date of Encounter: 05/19/2017  Hospital Problem List   Principal Problem:   DOE (dyspnea on exertion) Active Problems:   Hypertension   Asthma   Diabetes mellitus type 2, insulin dependent ([place])   Acute kidney injury ([place])   History of nephrectomy   HLD (hyperlipidemia)   Liver cirrhosis secondary to NASH (HCC)   Thrombocytopenia (HCC)   Renal cell carcinoma   Hypothyroidism   Hypersplenism   Osteoarthritis   Overactive bladder   Dyspnea   Acute diastolic CHF (congestive heart failure) ([place])   Right heart failure ([place])    Chief Complaint   Breathing better  Subjective   RHC yesterday showed elevated PCWP of 32 mmHg and large V waves (but no significant MR), elevated TPG - mean PAP of 44 mmHg, fick CO 6.2 L/min, CI of 3.2 L/min. Lasix increased yesterday. Diuresed 1.5L negative overnight - now down almost 4L. Creatinine improved from 1.22 to 1.12.   Objective   Vitals:   05/18/17 1715 05/18/17 1958 05/19/17 0418 05/19/17 0900  BP: (!) 134/53 (!) 150/48 (!) 124/47 (!) 151/63  Pulse: 72 81 72 73  Resp: _0 Temp:  98.1 F (36.7 C) 97.9 F (36.6 C)   TempSrc:  Oral Oral   SpO2: 94% 94% 96% 96%  Weight:   211 lb 6.4 oz (95.9 kg)   Height:        Intake/Output Summary (Last 24 hours) at 05/19/17 1153 Last data filed at 05/19/17 1006  Gross per 24 hour  Intake           731.83 ml  Output             1500 ml  Net          -768.17 ml   Filed Weights   05/17/17 0519 05/18/17 0504 05/19/17 0418  Weight: 216 lb 11.2 oz (98.3 kg) 214 lb 12.8 oz (97.4 kg) 211 lb 6.4 oz (95.9 kg)    Physical Exam   General appearance: alert and no distress Neck: JVD - 2 cm above sternal notch and no carotid bruit Lungs: diminished breath sounds bilaterally Heart: regular rate and rhythm Extremities: extremities normal, atraumatic, no cyanosis or edema Neurologic: Grossly normal  Inpatient Medications    Scheduled  Meds: . amLODipine  10 mg Oral Daily  . atorvastatin  20 mg Oral q1800  . doxycycline  100 mg Oral Q12H  . enoxaparin (LOVENOX) injection  40 mg Subcutaneous Q24H  . feeding supplement (PRO-STAT SUGAR FREE 64)  30 mL Oral BID  . furosemide  80 mg Intravenous BID  . gabapentin  100 mg Oral BID  . hydrALAZINE  25 mg Oral Q8H  . insulin aspart  0-5 Units Subcutaneous QHS  . insulin aspart  0-9 Units Subcutaneous TID WC  . insulin aspart  15 Units Subcutaneous TID WC  . insulin glargine  60 Units Subcutaneous QPM  . levothyroxine  25 mcg Oral QAC breakfast  . loratadine  10 mg Oral Daily  . metoprolol tartrate  25 mg Oral BID  . oxybutynin  5 mg Oral BH-q7a  . pantoprazole  40 mg Oral Daily  . predniSONE  10 mg Oral Q breakfast  . sodium chloride flush  3 mL Intravenous Q12H  . sodium chloride flush  3 mL Intravenous Q12H    Continuous Infusions: . sodium chloride      PRN Meds: sodium chloride, acetaminophen **  OR** acetaminophen, albuterol, antiseptic oral rinse, lip balm, LORazepam, ondansetron **OR** ondansetron (ZOFRAN) IV, sodium chloride flush, traMADol   Labs   Results for orders placed or performed during the hospital encounter of 05/13/17 (from the past 48 hour(s))  Glucose, capillary     Status: Abnormal   Collection Time: 05/17/17 12:02 PM  Result Value Ref Range   Glucose-Capillary 126 (H) 65 - 99 mg/dL  Urinalysis, Routine w reflex microscopic     Status: Abnormal   Collection Time: 05/17/17 12:12 PM  Result Value Ref Range   Color, Urine YELLOW YELLOW   APPearance CLEAR CLEAR   Specific Gravity, Urine 1.010 1.005 - 1.030   pH 5.0 5.0 - 8.0   Glucose, UA NEGATIVE NEGATIVE mg/dL   Hgb urine dipstick NEGATIVE NEGATIVE   Bilirubin Urine NEGATIVE NEGATIVE   Ketones, ur NEGATIVE NEGATIVE mg/dL   Protein, ur 100 (A) NEGATIVE mg/dL   Nitrite NEGATIVE NEGATIVE   Leukocytes, UA NEGATIVE NEGATIVE   RBC / HPF 0-5 0 - 5 RBC/hpf   WBC, UA 0-5 0 - 5 WBC/hpf   Bacteria,  UA RARE (A) NONE SEEN   Squamous Epithelial / LPF 0-5 (A) NONE SEEN  Glucose, capillary     Status: Abnormal   Collection Time: 05/17/17 12:16 PM  Result Value Ref Range   Glucose-Capillary 157 (H) 65 - 99 mg/dL   Comment 1 Notify RN   Glucose, capillary     Status: Abnormal   Collection Time: 05/17/17  3:00 PM  Result Value Ref Range   Glucose-Capillary 222 (H) 65 - 99 mg/dL  Glucose, capillary     Status: Abnormal   Collection Time: 05/17/17  4:23 PM  Result Value Ref Range   Glucose-Capillary 281 (H) 65 - 99 mg/dL   Comment 1 Notify RN   Glucose, capillary     Status: Abnormal   Collection Time: 05/17/17  9:36 PM  Result Value Ref Range   Glucose-Capillary 257 (H) 65 - 99 mg/dL   Comment 1 Notify RN    Comment 2 Document in Chart   Glucose, capillary     Status: Abnormal   Collection Time: 05/18/17  1:23 AM  Result Value Ref Range   Glucose-Capillary 272 (H) 65 - 99 mg/dL  Basic metabolic panel     Status: Abnormal   Collection Time: 05/18/17  3:23 AM  Result Value Ref Range   Sodium 133 (L) 135 - 145 mmol/L   Potassium 4.3 3.5 - 5.1 mmol/L   Chloride 97 (L) 101 - 111 mmol/L   CO2 28 22 - 32 mmol/L   Glucose, Bld 336 (H) 65 - 99 mg/dL   BUN 45 (H) 6 - 20 mg/dL   Creatinine, Ser 1.22 (H) 0.44 - 1.00 mg/dL   Calcium 8.0 (L) 8.9 - 10.3 mg/dL   GFR calc non Af Amer 41 (L) >60 mL/min   GFR calc Af Amer 47 (L) >60 mL/min    Comment: (NOTE) The eGFR has been calculated using the CKD EPI equation. This calculation has not been validated in all clinical situations. eGFR's persistently <60 mL/min signify possible Chronic Kidney Disease.    Anion gap 8 5 - 15  Glucose, capillary     Status: Abnormal   Collection Time: 05/18/17  7:35 AM  Result Value Ref Range   Glucose-Capillary 229 (H) 65 - 99 mg/dL   Comment 1 Notify RN    Comment 2 Document in Chart   Glucose, capillary  Status: Abnormal   Collection Time: 05/18/17 11:40 AM  Result Value Ref Range    Glucose-Capillary 167 (H) 65 - 99 mg/dL   Comment 1 Notify RN    Comment 2 Document in Chart   Protime-INR     Status: None   Collection Time: 05/18/17  2:49 PM  Result Value Ref Range   Prothrombin Time 15.0 11.4 - 15.2 seconds   INR 1.17   Glucose, capillary     Status: Abnormal   Collection Time: 05/18/17  4:42 PM  Result Value Ref Range   Glucose-Capillary 196 (H) 65 - 99 mg/dL   Comment 1 Notify RN    Comment 2 Document in Chart   Glucose, capillary     Status: Abnormal   Collection Time: 05/18/17  8:45 PM  Result Value Ref Range   Glucose-Capillary 242 (H) 65 - 99 mg/dL  Glucose, capillary     Status: Abnormal   Collection Time: 05/18/17 11:58 PM  Result Value Ref Range   Glucose-Capillary 222 (H) 65 - 99 mg/dL  CBC     Status: Abnormal   Collection Time: 05/19/17  3:33 AM  Result Value Ref Range   WBC 12.2 (H) 4.0 - 10.5 K/uL   RBC 4.74 3.87 - 5.11 MIL/uL   Hemoglobin 14.3 12.0 - 15.0 g/dL   HCT 43.4 36.0 - 46.0 %   MCV 91.6 78.0 - 100.0 fL   MCH 30.2 26.0 - 34.0 pg   MCHC 32.9 30.0 - 36.0 g/dL   RDW 13.9 11.5 - 15.5 %   Platelets 146 (L) 150 - 400 K/uL  Comprehensive metabolic panel     Status: Abnormal   Collection Time: 05/19/17  3:33 AM  Result Value Ref Range   Sodium 139 135 - 145 mmol/L   Potassium 3.9 3.5 - 5.1 mmol/L   Chloride 99 (L) 101 - 111 mmol/L   CO2 30 22 - 32 mmol/L   Glucose, Bld 109 (H) 65 - 99 mg/dL   BUN 49 (H) 6 - 20 mg/dL   Creatinine, Ser 1.12 (H) 0.44 - 1.00 mg/dL   Calcium 8.2 (L) 8.9 - 10.3 mg/dL   Total Protein 5.9 (L) 6.5 - 8.1 g/dL   Albumin 2.6 (L) 3.5 - 5.0 g/dL   AST 29 15 - 41 U/L   ALT 36 14 - 54 U/L   Alkaline Phosphatase 66 38 - 126 U/L   Total Bilirubin 0.8 0.3 - 1.2 mg/dL   GFR calc non Af Amer 45 (L) >60 mL/min   GFR calc Af Amer 52 (L) >60 mL/min    Comment: (NOTE) The eGFR has been calculated using the CKD EPI equation. This calculation has not been validated in all clinical situations. eGFR's persistently <60  mL/min signify possible Chronic Kidney Disease.    Anion gap 10 5 - 15  Glucose, capillary     Status: Abnormal   Collection Time: 05/19/17  7:48 AM  Result Value Ref Range   Glucose-Capillary 54 (L) 65 - 99 mg/dL   Comment 1 Notify RN   Glucose, capillary     Status: None   Collection Time: 05/19/17  8:14 AM  Result Value Ref Range   Glucose-Capillary 73 65 - 99 mg/dL   Comment 1 Notify RN   Glucose, capillary     Status: Abnormal   Collection Time: 05/19/17 11:45 AM  Result Value Ref Range   Glucose-Capillary 200 (H) 65 - 99 mg/dL   Comment 1 Notify RN  ECG   N/A  Telemetry   Sinus- personally reviewed  Radiology    No results found.  Cardiac Studies   N/A  Assessment   1. Principal Problem: 2.   DOE (dyspnea on exertion) 3. Active Problems: 4.   Hypertension 5.   Asthma 6.   Diabetes mellitus type 2, insulin dependent ([place]) 7.   Acute kidney injury ([place]) 8.   History of nephrectomy 9.   HLD (hyperlipidemia) 10.   Liver cirrhosis secondary to NASH ([place]) 11.   Thrombocytopenia ([place]) 12.   Renal cell carcinoma 13.   Hypothyroidism 14.   Hypersplenism 15.   Osteoarthritis 16.   Overactive bladder 17.   Dyspnea 18.   Acute diastolic CHF (congestive heart failure) ([place]) 19.   Right heart failure ([place]) 20.   Plan   1. Improving with diuresis - continue today. Creatinine is improving as well. Problems with hypoglycemia, will defer to Dr. Maudie Mercury.   Time Spent Directly with Patient:  I have spent a total of 15 minutes with the patient reviewing hospital notes, telemetry, EKGs, labs and examining the patient as well as establishing an assessment and plan that was discussed personally with the patient. > 50% of time was spent in direct patient care.  Length of Stay:  LOS: 5 days   Pixie Casino, MD, [place]  Attending Cardiologist  Direct Dial: [phone]  Fax: [phone]  Website:  [email or web address]  Amika Tassin 05/19/2017, 11:53 AM

## 2017-05-19 NOTE — Progress Notes (Signed)
Pt CBG low, orange juice and snack given will recheck in 15 minutes

## 2017-05-20 ENCOUNTER — Inpatient Hospital Stay (HOSPITAL_COMMUNITY): Payer: Medicare Other

## 2017-05-20 LAB — CBC
HEMATOCRIT: 42.1 % (ref 36.0–46.0)
HEMOGLOBIN: 13.8 g/dL (ref 12.0–15.0)
MCH: 30.1 pg (ref 26.0–34.0)
MCHC: 32.8 g/dL (ref 30.0–36.0)
MCV: 91.7 fL (ref 78.0–100.0)
Platelets: 136 10*3/uL — ABNORMAL LOW (ref 150–400)
RBC: 4.59 MIL/uL (ref 3.87–5.11)
RDW: 13.8 % (ref 11.5–15.5)
WBC: 9.6 10*3/uL (ref 4.0–10.5)

## 2017-05-20 LAB — COMPREHENSIVE METABOLIC PANEL
ALK PHOS: 64 U/L (ref 38–126)
ALT: 33 U/L (ref 14–54)
ANION GAP: 10 (ref 5–15)
AST: 30 U/L (ref 15–41)
Albumin: 2.4 g/dL — ABNORMAL LOW (ref 3.5–5.0)
BILIRUBIN TOTAL: 0.8 mg/dL (ref 0.3–1.2)
BUN: 46 mg/dL — ABNORMAL HIGH (ref 6–20)
CALCIUM: 8.2 mg/dL — AB (ref 8.9–10.3)
CO2: 32 mmol/L (ref 22–32)
Chloride: 97 mmol/L — ABNORMAL LOW (ref 101–111)
Creatinine, Ser: 1.2 mg/dL — ABNORMAL HIGH (ref 0.44–1.00)
GFR, EST AFRICAN AMERICAN: 48 mL/min — AB (ref >60)
GFR, EST NON AFRICAN AMERICAN: 42 mL/min — AB (ref >60)
Glucose, Bld: 110 mg/dL — ABNORMAL HIGH (ref 65–99)
Potassium: 3.7 mmol/L (ref 3.5–5.1)
SODIUM: 139 mmol/L (ref 135–145)
Total Protein: 5.6 g/dL — ABNORMAL LOW (ref 6.5–8.1)

## 2017-05-20 LAB — GLUCOSE, CAPILLARY
GLUCOSE-CAPILLARY: 61 mg/dL — AB (ref 65–99)
GLUCOSE-CAPILLARY: 133 mg/dL — AB (ref 65–99)
GLUCOSE-CAPILLARY: 143 mg/dL — AB (ref 65–99)
GLUCOSE-CAPILLARY: 165 mg/dL — AB (ref 65–99)
Glucose-Capillary: 77 mg/dL (ref 65–99)
Glucose-Capillary: 281 mg/dL — ABNORMAL HIGH (ref 65–99)

## 2017-05-20 MED ORDER — INSULIN GLARGINE 100 UNIT/ML ~~LOC~~ SOLN
55.0000 [IU] | Freq: Every evening | SUBCUTANEOUS | Status: DC
  Administered 2017-05-20: 55 [IU] via SUBCUTANEOUS
  Filled 2017-05-20: qty 0.55
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
## 2017-05-20 NOTE — Progress Notes (Signed)
Patient continues to refuse bed alarm. Will continue to monitor patient.

## 2017-05-20 NOTE — Progress Notes (Signed)
DAILY PROGRESS NOTE   Patient Name: Margaret Dyer Date of Encounter: 05/20/2017  Hospital Problem List   Principal Problem:   DOE (dyspnea on exertion) Active Problems:   Hypertension   Asthma   Diabetes mellitus type 2, insulin dependent ([place])   Acute kidney injury ([place])   History of nephrectomy   HLD (hyperlipidemia)   Liver cirrhosis secondary to NASH (HCC)   Thrombocytopenia (HCC)   Renal cell carcinoma   Hypothyroidism   Hypersplenism   Osteoarthritis   Overactive bladder   Dyspnea   Acute diastolic CHF (congestive heart failure) ([place])   Right heart failure [facility])    Chief Complaint   Still feels short of breath  Subjective   Margaret Dyer continues to be dyspneic, despite being an additional 3L negative yesterday - now 7L negative. Lung exam demonstrates persistent crackles - more suggestive of pulmonary fibrosis, although pulmonary did not suspect this was the case.  Objective   Vitals:   05/19/17 1220 05/19/17 2036 05/20/17 0426 05/20/17 1245  BP: (!) 140/56 (!) 137/56 (!) 150/55 139/61  Pulse: 73 76 81 71  Resp: _0 Temp: 98.3 F (36.8 C) 98.3 F (36.8 C) 97.2 F (36.2 C) 98.1 F (36.7 C)  TempSrc: Oral Oral Oral Oral  SpO2: 95% 94% 95% 95%  Weight:   210 lb 1.6 oz (95.3 kg)   Height:        Intake/Output Summary (Last 24 hours) at 05/20/17 1429 Last data filed at 05/20/17 1424  Gross per 24 hour  Intake             1200 ml  Output             4100 ml  Net            -2900 ml   Filed Weights   05/18/17 0504 05/19/17 0418 05/20/17 0426  Weight: 214 lb 12.8 oz (97.4 kg) 211 lb 6.4 oz (95.9 kg) 210 lb 1.6 oz (95.3 kg)    Physical Exam   General appearance: alert, mild distress and on oxygen Lungs: diminished breath sounds bilaterally and rales bilaterally Heart: regular rate and rhythm Extremities: extremities normal, atraumatic, no cyanosis or edema Neurologic: Grossly normal  Inpatient Medications    Scheduled Meds: .  amLODipine  10 mg Oral Daily  . atorvastatin  20 mg Oral q1800  . enoxaparin (LOVENOX) injection  40 mg Subcutaneous Q24H  . feeding supplement (PRO-STAT SUGAR FREE 64)  30 mL Oral BID  . furosemide  80 mg Intravenous BID  . gabapentin  100 mg Oral BID  . hydrALAZINE  25 mg Oral Q8H  . insulin aspart  0-5 Units Subcutaneous QHS  . insulin aspart  0-9 Units Subcutaneous TID WC  . insulin aspart  15 Units Subcutaneous TID WC  . insulin glargine  55 Units Subcutaneous QPM  . levothyroxine  25 mcg Oral QAC breakfast  . loratadine  10 mg Oral Daily  . metoprolol tartrate  25 mg Oral BID  . oxybutynin  5 mg Oral BH-q7a  . pantoprazole  40 mg Oral Daily  . predniSONE  10 mg Oral Q breakfast  . sodium chloride flush  3 mL Intravenous Q12H  . sodium chloride flush  3 mL Intravenous Q12H    Continuous Infusions: . sodium chloride      PRN Meds: sodium chloride, acetaminophen **OR** acetaminophen, albuterol, antiseptic oral rinse, lip balm, LORazepam, ondansetron **OR** ondansetron (ZOFRAN) IV, sodium chloride flush,  traMADol   Labs   Results for orders placed or performed during the hospital encounter of 05/13/17 (from the past 48 hour(s))  Protime-INR     Status: None   Collection Time: 05/18/17  2:49 PM  Result Value Ref Range   Prothrombin Time 15.0 11.4 - 15.2 seconds   INR 1.17   Glucose, capillary     Status: Abnormal   Collection Time: 05/18/17  4:42 PM  Result Value Ref Range   Glucose-Capillary 196 (H) 65 - 99 mg/dL   Comment 1 Notify RN    Comment 2 Document in Chart   Glucose, capillary     Status: Abnormal   Collection Time: 05/18/17  8:45 PM  Result Value Ref Range   Glucose-Capillary 242 (H) 65 - 99 mg/dL  Glucose, capillary     Status: Abnormal   Collection Time: 05/18/17 11:58 PM  Result Value Ref Range   Glucose-Capillary 222 (H) 65 - 99 mg/dL  CBC     Status: Abnormal   Collection Time: 05/19/17  3:33 AM  Result Value Ref Range   WBC 12.2 (H) 4.0 - 10.5  K/uL   RBC 4.74 3.87 - 5.11 MIL/uL   Hemoglobin 14.3 12.0 - 15.0 g/dL   HCT 43.4 36.0 - 46.0 %   MCV 91.6 78.0 - 100.0 fL   MCH 30.2 26.0 - 34.0 pg   MCHC 32.9 30.0 - 36.0 g/dL   RDW 13.9 11.5 - 15.5 %   Platelets 146 (L) 150 - 400 K/uL  Comprehensive metabolic panel     Status: Abnormal   Collection Time: 05/19/17  3:33 AM  Result Value Ref Range   Sodium 139 135 - 145 mmol/L   Potassium 3.9 3.5 - 5.1 mmol/L   Chloride 99 (L) 101 - 111 mmol/L   CO2 30 22 - 32 mmol/L   Glucose, Bld 109 (H) 65 - 99 mg/dL   BUN 49 (H) 6 - 20 mg/dL   Creatinine, Ser 1.12 (H) 0.44 - 1.00 mg/dL   Calcium 8.2 (L) 8.9 - 10.3 mg/dL   Total Protein 5.9 (L) 6.5 - 8.1 g/dL   Albumin 2.6 (L) 3.5 - 5.0 g/dL   AST 29 15 - 41 U/L   ALT 36 14 - 54 U/L   Alkaline Phosphatase 66 38 - 126 U/L   Total Bilirubin 0.8 0.3 - 1.2 mg/dL   GFR calc non Af Amer 45 (L) >60 mL/min   GFR calc Af Amer 52 (L) >60 mL/min    Comment: (NOTE) The eGFR has been calculated using the CKD EPI equation. This calculation has not been validated in all clinical situations. eGFR's persistently <60 mL/min signify possible Chronic Kidney Disease.    Anion gap 10 5 - 15  Glucose, capillary     Status: Abnormal   Collection Time: 05/19/17  7:48 AM  Result Value Ref Range   Glucose-Capillary 54 (L) 65 - 99 mg/dL   Comment 1 Notify RN   Glucose, capillary     Status: None   Collection Time: 05/19/17  8:14 AM  Result Value Ref Range   Glucose-Capillary 73 65 - 99 mg/dL   Comment 1 Notify RN   Glucose, capillary     Status: Abnormal   Collection Time: 05/19/17 11:45 AM  Result Value Ref Range   Glucose-Capillary 200 (H) 65 - 99 mg/dL   Comment 1 Notify RN   Glucose, capillary     Status: Abnormal   Collection Time: 05/19/17  4:23  PM  Result Value Ref Range   Glucose-Capillary 145 (H) 65 - 99 mg/dL   Comment 1 Notify RN   Glucose, capillary     Status: Abnormal   Collection Time: 05/19/17  9:01 PM  Result Value Ref Range    Glucose-Capillary 174 (H) 65 - 99 mg/dL  Glucose, capillary     Status: Abnormal   Collection Time: 05/19/17 11:03 PM  Result Value Ref Range   Glucose-Capillary 156 (H) 65 - 99 mg/dL  Glucose, capillary     Status: Abnormal   Collection Time: 05/20/17  4:16 AM  Result Value Ref Range   Glucose-Capillary 61 (L) 65 - 99 mg/dL  CBC     Status: Abnormal   Collection Time: 05/20/17  4:55 AM  Result Value Ref Range   WBC 9.6 4.0 - 10.5 K/uL   RBC 4.59 3.87 - 5.11 MIL/uL   Hemoglobin 13.8 12.0 - 15.0 g/dL   HCT 42.1 36.0 - 46.0 %   MCV 91.7 78.0 - 100.0 fL   MCH 30.1 26.0 - 34.0 pg   MCHC 32.8 30.0 - 36.0 g/dL   RDW 13.8 11.5 - 15.5 %   Platelets 136 (L) 150 - 400 K/uL  Comprehensive metabolic panel     Status: Abnormal   Collection Time: 05/20/17  4:55 AM  Result Value Ref Range   Sodium 139 135 - 145 mmol/L   Potassium 3.7 3.5 - 5.1 mmol/L   Chloride 97 (L) 101 - 111 mmol/L   CO2 32 22 - 32 mmol/L   Glucose, Bld 110 (H) 65 - 99 mg/dL   BUN 46 (H) 6 - 20 mg/dL   Creatinine, Ser 1.20 (H) 0.44 - 1.00 mg/dL   Calcium 8.2 (L) 8.9 - 10.3 mg/dL   Total Protein 5.6 (L) 6.5 - 8.1 g/dL   Albumin 2.4 (L) 3.5 - 5.0 g/dL   AST 30 15 - 41 U/L   ALT 33 14 - 54 U/L   Alkaline Phosphatase 64 38 - 126 U/L   Total Bilirubin 0.8 0.3 - 1.2 mg/dL   GFR calc non Af Amer 42 (L) >60 mL/min   GFR calc Af Amer 48 (L) >60 mL/min    Comment: (NOTE) The eGFR has been calculated using the CKD EPI equation. This calculation has not been validated in all clinical situations. eGFR's persistently <60 mL/min signify possible Chronic Kidney Disease.    Anion gap 10 5 - 15  Glucose, capillary     Status: Abnormal   Collection Time: 05/20/17  6:11 AM  Result Value Ref Range   Glucose-Capillary 133 (H) 65 - 99 mg/dL  Glucose, capillary     Status: Abnormal   Collection Time: 05/20/17  7:43 AM  Result Value Ref Range   Glucose-Capillary 143 (H) 65 - 99 mg/dL   Comment 1 Notify RN   Glucose, capillary      Status: None   Collection Time: 05/20/17 11:30 AM  Result Value Ref Range   Glucose-Capillary 77 65 - 99 mg/dL   Comment 1 Notify RN     ECG   N/A  Telemetry   Sinus rhythm - Personally Reviewed  Radiology    No results found.  Cardiac Studies   N/A  Assessment   1. Principal Problem: 2.   DOE (dyspnea on exertion) 3. Active Problems: 4.   Hypertension 5.   Asthma 6.   Diabetes mellitus type 2, insulin dependent ([place]) 7.   Acute kidney injury ([place]) 8.  History of nephrectomy 9.   HLD (hyperlipidemia) 10.   Liver cirrhosis secondary to NASH ([place]) 11.   Thrombocytopenia ([place]) 12.   Renal cell carcinoma 13.   Hypothyroidism 14.   Hypersplenism 15.   Osteoarthritis 16.   Overactive bladder 17.   Dyspnea 18.   Acute diastolic CHF (congestive heart failure) ([place]) 19.   Right heart failure ([place]) 20.   Plan   1. Persistent dyspnea despite diuresis - pulmonary evaluated and did not feel that there were findings of interstitial fibrosis, however, she has persistent crackles despite adequate diuresis in the upper lobes. She had CT angiogram which was negative for PE - CXR mentioned interstitial prominence, but CT did not suggest that (it was 2 mm slices). Will obtain HRCT (1 mm slices) to assess for underlying cause of persistent dyspnea.  Time Spent Directly with Patient:  I have spent a total of 25 minutes with the patient reviewing hospital notes, telemetry, EKGs, labs and examining the patient as well as establishing an assessment and plan that was discussed personally with the patient. > 50% of time was spent in direct patient care.  Length of Stay:  LOS: 6 days   Pixie Casino, MD, [place]  Attending Cardiologist  Direct Dial: [phone]  Fax: [phone]  Website:  [email or web address] 05/20/2017, 2:29 PM

## 2017-05-21 LAB — CBC
HEMATOCRIT: 41.6 % (ref 36.0–46.0)
HEMOGLOBIN: 13.5 g/dL (ref 12.0–15.0)
MCH: 29.6 pg (ref 26.0–34.0)
MCHC: 32.5 g/dL (ref 30.0–36.0)
MCV: 91.2 fL (ref 78.0–100.0)
Platelets: 129 10*3/uL — ABNORMAL LOW (ref 150–400)
RBC: 4.56 MIL/uL (ref 3.87–5.11)
RDW: 13.6 % (ref 11.5–15.5)
WBC: 9.2 10*3/uL (ref 4.0–10.5)

## 2017-05-21 LAB — GLUCOSE, CAPILLARY
GLUCOSE-CAPILLARY: 79 mg/dL (ref 65–99)
Glucose-Capillary: 49 mg/dL — ABNORMAL LOW (ref 65–99)
Glucose-Capillary: 94 mg/dL (ref 65–99)
Glucose-Capillary: 128 mg/dL — ABNORMAL HIGH (ref 65–99)
Glucose-Capillary: 152 mg/dL — ABNORMAL HIGH (ref 65–99)
Glucose-Capillary: 101 mg/dL — ABNORMAL HIGH (ref 65–99)
Glucose-Capillary: 189 mg/dL — ABNORMAL HIGH (ref 65–99)

## 2017-05-21 LAB — POCT I-STAT 3, VENOUS BLOOD GAS (G3P V)
ACID-BASE EXCESS: 6 mmol/L — AB (ref 0.0–2.0)
Acid-Base Excess: 7 mmol/L — ABNORMAL HIGH (ref 0.0–2.0)
BICARBONATE: 31 mmol/L — AB (ref 20.0–28.0)
BICARBONATE: 31.8 mmol/L — AB (ref 20.0–28.0)
O2 Saturation: 69 %
O2 Saturation: 69 %
PCO2 VEN: 46.8 mmHg (ref 44.0–60.0)
PH VEN: 7.44 — AB (ref 7.250–7.430)
TCO2: 32 mmol/L (ref 0–100)
TCO2: 33 mmol/L (ref 0–100)
pCO2, Ven: 45.7 mmHg (ref 44.0–60.0)
pH, Ven: 7.44 — ABNORMAL HIGH (ref 7.250–7.430)
pO2, Ven: 35 mmHg (ref 32.0–45.0)
pO2, Ven: 35 mmHg (ref 32.0–45.0)

## 2017-05-21 LAB — COMPREHENSIVE METABOLIC PANEL
ALK PHOS: 63 U/L (ref 38–126)
ALT: 34 U/L (ref 14–54)
AST: 25 U/L (ref 15–41)
Albumin: 2.6 g/dL — ABNORMAL LOW (ref 3.5–5.0)
Anion gap: 8 (ref 5–15)
BILIRUBIN TOTAL: 0.7 mg/dL (ref 0.3–1.2)
BUN: 44 mg/dL — ABNORMAL HIGH (ref 6–20)
CALCIUM: 8.4 mg/dL — AB (ref 8.9–10.3)
CO2: 35 mmol/L — ABNORMAL HIGH (ref 22–32)
Chloride: 96 mmol/L — ABNORMAL LOW (ref 101–111)
Creatinine, Ser: 1.07 mg/dL — ABNORMAL HIGH (ref 0.44–1.00)
GFR calc Af Amer: 55 mL/min — ABNORMAL LOW (ref >60)
GFR, EST NON AFRICAN AMERICAN: 48 mL/min — AB (ref >60)
GLUCOSE: 58 mg/dL — AB (ref 65–99)
POTASSIUM: 3.5 mmol/L (ref 3.5–5.1)
Sodium: 139 mmol/L (ref 135–145)
TOTAL PROTEIN: 5.4 g/dL — AB (ref 6.5–8.1)

## 2017-05-21 MED ORDER — CALCIUM CARBONATE ANTACID 500 MG PO CHEW
2.0000 | CHEWABLE_TABLET | Freq: Three times a day (TID) | ORAL | Status: DC | PRN
  Administered 2017-05-21: 400 mg via ORAL
  Filled 2017-05-21: qty 2

## 2017-05-21 MED ORDER — PIPERACILLIN-TAZOBACTAM 3.375 G IVPB 30 MIN
3.3750 g | INTRAVENOUS | Status: AC
  Administered 2017-05-21: 3.375 g via INTRAVENOUS
  Filled 2017-05-21: qty 50

## 2017-05-21 MED ORDER — HYDRALAZINE HCL 50 MG PO TABS
50.0000 mg | ORAL_TABLET | Freq: Three times a day (TID) | ORAL | Status: DC
  Administered 2017-05-21 – 2017-05-24 (×11): 50 mg via ORAL
  Filled 2017-05-21 (×12): qty 1

## 2017-05-21 MED ORDER — VANCOMYCIN HCL 10 G IV SOLR
1250.0000 mg | Freq: Every day | INTRAVENOUS | Status: DC
  Administered 2017-05-22 – 2017-05-23 (×2): 1250 mg via INTRAVENOUS
  Filled 2017-05-21 (×3): qty 1250

## 2017-05-21 MED ORDER — POTASSIUM CHLORIDE CRYS ER 20 MEQ PO TBCR
40.0000 meq | EXTENDED_RELEASE_TABLET | Freq: Once | ORAL | Status: AC
  Administered 2017-05-21: 40 meq via ORAL
  Filled 2017-05-21: qty 2

## 2017-05-21 MED ORDER — INSULIN GLARGINE 100 UNIT/ML ~~LOC~~ SOLN
50.0000 [IU] | Freq: Every day | SUBCUTANEOUS | Status: DC
  Administered 2017-05-21 – 2017-05-23 (×3): 50 [IU] via SUBCUTANEOUS
  Filled 2017-05-21 (×3): qty 0.5

## 2017-05-21 MED ORDER — PIPERACILLIN-TAZOBACTAM 3.375 G IVPB
3.3750 g | Freq: Three times a day (TID) | INTRAVENOUS | Status: DC
  Administered 2017-05-21 – 2017-05-23 (×7): 3.375 g via INTRAVENOUS
  Filled 2017-05-21 (×9): qty 50

## 2017-05-21 NOTE — Progress Notes (Signed)
CM following for DCP; patient wants home oxygen; CM explained in detail that she will have to qualify for home oxygen to get insurance coverage or private pay for oxygen; patient will be checked again the day of discharge to see if she will qualify for home 02; Mindi Slicker [facility] [phone]

## 2017-05-21 NOTE — Progress Notes (Addendum)
Progress Note  Patient Name: LILLITH MCNEFF Date of Encounter: 05/21/2017  Primary Cardiologist: Reola Calkins ([place] long ago)  Subjective   Pt lying almost flat without dyspnea but has DOE with getting OOB to [facility]. No chest pain.  Inpatient Medications    Scheduled Meds: . amLODipine  10 mg Oral Daily  . atorvastatin  20 mg Oral q1800  . enoxaparin (LOVENOX) injection  40 mg Subcutaneous Q24H  . feeding supplement (PRO-STAT SUGAR FREE 64)  30 mL Oral BID  . furosemide  80 mg Intravenous BID  . gabapentin  100 mg Oral BID  . hydrALAZINE  50 mg Oral Q8H  . insulin aspart  0-5 Units Subcutaneous QHS  . insulin aspart  0-9 Units Subcutaneous TID WC  . insulin aspart  15 Units Subcutaneous TID WC  . insulin glargine  50 Units Subcutaneous QHS  . levothyroxine  25 mcg Oral QAC breakfast  . loratadine  10 mg Oral Daily  . metoprolol tartrate  25 mg Oral BID  . oxybutynin  5 mg Oral BH-q7a  . pantoprazole  40 mg Oral Daily  . sodium chloride flush  3 mL Intravenous Q12H  . sodium chloride flush  3 mL Intravenous Q12H   Continuous Infusions: . sodium chloride    . piperacillin-tazobactam (ZOSYN)  IV    . vancomycin     PRN Meds: sodium chloride, acetaminophen **OR** acetaminophen, albuterol, antiseptic oral rinse, lip balm, LORazepam, ondansetron **OR** ondansetron (ZOFRAN) IV, sodium chloride flush, traMADol   Vital Signs    Vitals:   05/20/17 2030 05/20/17 2103 05/21/17 0602 05/21/17 0606  BP: (!) 144/57 (!) 149/115 134/60 134/60  Pulse: 82 87  82  Resp: 20   20  Temp: 98.3 F (36.8 C)   97.5 F (36.4 C)  TempSrc: Oral   Oral  SpO2: 96% 94%  97%  Weight:    204 lb 9.6 oz (92.8 kg)  Height:        Intake/Output Summary (Last 24 hours) at 05/21/17 1138 Last data filed at 05/21/17 0940  Gross per 24 hour  Intake             1680 ml  Output             3100 ml  Net            -1420 ml   Filed Weights   05/19/17 0418 05/20/17 0426 05/21/17 0606  Weight: 211 lb 6.4 oz  (95.9 kg) 210 lb 1.6 oz (95.3 kg) 204 lb 9.6 oz (92.8 kg)    Telemetry    Sinus rhythm with rates in the 70s and brief, few seconds, ectopic atrial tachycardia - Personally Reviewed  ECG    No new tracings  Physical Exam   GEN: Obese female . No acute distress.   Neck: No JVD Cardiac: RRR, no murmurs, rubs, or gallops.  Respiratory:  lungs with fine inspiratory crackles scattered, and very faint end inspiratory wheezes GI: Soft, obese, non-distended, mild tenderness to palpation in the right upper quadrant MS:  1-2+ pitting lower extremity edema; No deformity. Neuro:  Nonfocal  Psych: Normal affect   Labs    Chemistry Recent Labs Lab 05/19/17 0333 05/20/17 0455 05/21/17 0527  NA 139 139 139  K 3.9 3.7 3.5  CL 99* 97* 96*  CO2 30 32 35*  GLUCOSE 109* 110* 58*  BUN 49* 46* 44*  CREATININE 1.12* 1.20* 1.07*  CALCIUM 8.2* 8.2* 8.4*  PROT 5.9* 5.6* 5.4*  ALBUMIN 2.6* 2.4* 2.6*  AST _0 ALT 36 33 34  ALKPHOS 66 64 63  BILITOT 0.8 0.8 0.7  GFRNONAA 45* 42* 48*  GFRAA 52* 48* 55*  ANIONGAP _1 Hematology Recent Labs Lab 05/19/17 0333 05/20/17 0455 05/21/17 0527  WBC 12.2* 9.6 9.2  RBC 4.74 4.59 4.56  HGB 14.3 13.8 13.5  HCT 43.4 42.1 41.6  MCV 91.6 91.7 91.2  MCH 30.2 30.1 29.6  MCHC 32.9 32.8 32.5  RDW 13.9 13.8 13.6  PLT 146* 136* 129*    Cardiac EnzymesNo results for input(s): TROPONINI in the last 168 hours. No results for input(s): TROPIPOC in the last 168 hours.   BNPNo results for input(s): BNP, PROBNP in the last 168 hours.   DDimer No results for input(s): DDIMER in the last 168 hours.   Radiology    Ct Chest High Resolution  Result Date: 05/20/2017 CLINICAL DATA:  Inpatient.  Dyspnea.  Hypoxia.  Lung crackles. EXAM: CT CHEST WITHOUT CONTRAST TECHNIQUE: Multidetector CT imaging of the chest was performed following the standard protocol without intravenous contrast. High resolution imaging of the lungs, as well as inspiratory  and expiratory imaging, was performed. COMPARISON:  05/13/2017 chest CT angiogram. 05/12/2017 chest radiograph. FINDINGS: Cardiovascular: Mild cardiomegaly. No significant pericardial fluid/thickening. Left main, left anterior descending, left circumflex and right coronary atherosclerosis. Atherosclerotic nonaneurysmal thoracic aorta. Dilated main pulmonary artery (3.5 cm diameter). Mediastinum/Nodes: Subcentimeter hypodense right thyroid lobe nodule. Unremarkable esophagus. No pathologically enlarged axillary, mediastinal or gross hilar lymph nodes, noting limited sensitivity for the detection of hilar adenopathy on this noncontrast study. Lungs/Pleura: No pneumothorax. Small dependent bilateral pleural effusions, increased mildly bilaterally since 05/13/2017. Segmental dependent bilateral lower lobe atelectasis. New patchy prominent regions of peribronchovascular ground-glass opacity and consolidation in both lungs, predominantly in the upper lobes, most prominent in the right upper lobe. No discrete significant solid pulmonary nodules in the aerated portions of the lungs. No significant regions of subpleural reticulation, traction bronchiectasis, architectural distortion or frank honeycombing. No evidence of significant air trapping on the limited expiration sequence. Upper abdomen: Diffusely irregular liver surface compatible cirrhosis. Right adrenal 1.5 cm adenoma with density -1 HU. Partially visualized renal cortical scarring in the posterior upper right kidney. Left adrenal 1.4 cm adenoma with density -4 HU. Partially visualized postsurgical changes from left nephrectomy. Musculoskeletal: No aggressive appearing focal osseous lesions. Moderate thoracic spondylosis. IMPRESSION: 1. No evidence of interstitial lung disease. 2. Patchy peribronchovascular ground-glass attenuation and consolidation in the bilateral upper lobes, new since 05/13/2017 chest CT angiogram study, most compatible with bronchopneumonia,  although a component of pulmonary edema may be present given the cardiomegaly. 3. Small dependent bilateral pleural effusions, mildly increased bilaterally. Associated mild bibasilar atelectasis. 4. Hepatic cirrhosis . 5. Bilateral adrenal adenomas. 6. Left main and 3 vessel coronary atherosclerosis . 7. Dilated main pulmonary artery, suggesting pulmonary arterial hypertension. Aortic Atherosclerosis (ICD10-I70.0). Electronically Signed   By: Ilona Sorrel M.D.   On: 05/20/2017 19:04    Cardiac Studies   Right Heart Cath and Coronary Angiography 05/18/17  Conclusion     Hemodynamic findings consistent with moderate-severe pulmonary hypertension.   Severe diastolic heart failure with PCWP of 32 mmHg. Noted large V wave on wedge waveform. Appears to be mostly pulmonary venous congestion, however there is a transpulmonary gradient of 12 mmHg suggesting there is a component of pulmonary disease as well. The presence of a very large aching V wave in PCWP waveforms would  suggest mitral regurgitation.   ______________________________________________________________________________  Echocardiogram 05/13/17 ------------------------------------------------------------------- Study Conclusions  - Left ventricle: The cavity size was normal. Wall thickness was   normal. Systolic function was normal. The estimated ejection   fraction was in the range of 55% to 60%. Features are consistent   with a pseudonormal left ventricular filling pattern, with   concomitant abnormal relaxation and increased filling pressure   (grade 2 diastolic dysfunction). - Left atrium: The atrium was mildly dilated.    Patient Profile     82 y.o. female two-week duration as significant Dyspnea on exertion in the setting of chronic lower extremity edema  Assessment & Plan    Acute on chronic diastolic CHF -Echocardiogram on 05/13/17 showed EF 55-60% and pseudo-normal left ventricular filling pattern with concomitant  abnormal relaxation and increased filling pressure, grade 2 diastolic dysfunction -Chest x-ray on 05/12/17 showed interstitial prominence noted of the lungs bilaterally which may reflect chronic interstitial lung disease, mild interstitial edema, aortic atherosclerosis -Right heart cath on 05/18/17 showed moderate to severe pulmonary hypertension, severe diastolic heart failure with elevated PCWP of 32 mmHg and large V waves (but no significant MR). Transpulmonary gradient of 12 mmHg suggesting a component of pulmonary disease. CT high-resolution on 05/19/17 was negative for interstitial lung disease. Per internal medicine note Dr. Valli Glance . The patient has airspace disease -Patient was not taking Lasix at home due to frequent urination. Will need to take diuretic at home. Discussed with patient and she understands. -Continuing Lasix 80 mg IV twice a day -Patient is hemodynamically stable. Weight is down from a max of 217 pounds to 204 pounds today (down 6 pounds from yesterday).  -Fluid status is down 8.8 L since admission with -1.8 L over the last 24 hours -No orthopnea, but continued dyspnea on minimal exertion. Is using oxygen but had O2 sat of 91% on walking test. Still has 1-2+ pitting lower extremity edema and fine scattered crackles  Acute kidney injury -History of nephrectomy secondary to renal cell carcinoma in 2000 -Serum creatinine 1.07 today (1.20 yesterday)  Hypertension - Moderately well controlled, ARB on hold, continuing Lopressor and Norvasc. Hydralazine increased to 50 mg 3 times a day per internal medicine  Diabetes type 2 -Management per internal medicine  Hyperlipidemia -History of cirrhosis but has normal LFTs now -Lipid panel on 05/14/2017: Total cholesterol 218, LDL 143. Statin initiated  Signed, Daune Perch, NP  05/21/2017, 11:38 AM    The patient was seen, examined and discussed with Daune Perch, NP-C and I agree with the above.   82 year old female admitted with  acute on chronic diastolic CHF sec to diuretics non-compliance (bothered by frequent urination), R sided cath on 6/15 showed high PCWP and moderate to severe pulmonary hypertension, good response to iv diuretics with negative 8.8 liters in 5 days and 1.8 L in the last 24 hours. She is now 204 lbs, her baseline is 206 lbs, however there is still evidence of fluid overload and O2 desaturation while walking. I will continue iv Lasix at least till tomorrow and re- evaluate. Crea is improving, replace potassium.   Ena Dawley, MD 05/21/2017

## 2017-05-21 NOTE — Progress Notes (Signed)
Patient ambulated in hall with mobility tech. Oxygen saturation only dropped to 91% on room air with ambulation.
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
## 2017-05-21 NOTE — Telephone Encounter (Signed)
Letter mailed to daughter

## 2017-05-21 NOTE — Progress Notes (Signed)
Pharmacy Antibiotic Note  Margaret Dyer is a 82 y.o. female admitted on 05/13/2017 with HCAP.  Pharmacy has been consulted for Vancomycin and Zosyn dosing.  Plan: Zosyn 3.375gm IV now over 30 min then 3.375gm IV q8h - subsequent doses over 4 hours Vancomycin 1251m IV q24h Will f/u micro data, renal function, and pt's clinical condition Vanc trough prn   Height: 5' 0.5" (153.7 cm) Weight: 210 lb 1.6 oz (95.3 kg) IBW/kg (Calculated) : 46.65  Temp (24hrs), Avg:98.2 F (36.8 C), Min:98.1 F (36.7 C), Max:98.3 F (36.8 C)   Recent Labs Lab 05/15/17 0527 05/16/17 0233 05/17/17 0159 05/18/17 0323 05/19/17 0333 05/20/17 0455  WBC 10.4 12.2* 10.8*  --  12.2* 9.6  CREATININE 1.03* 1.15* 1.08* 1.22* 1.12* 1.20*    Estimated Creatinine Clearance: 39 mL/min (A) (by C-G formula based on SCr of 1.2 mg/dL (H)).    Allergies  Allergen Reactions  . Cefuroxime Axetil     Upset stomach   . Ciprofloxacin     Upset stomach    Antimicrobials this admission: 6/10 Doxy >> 6/17 6/18 Vanc >>  6/18 Zosyn >>  Dose adjustments this admission: n/a  Microbiology results: Pending  Thank you for allowing pharmacy to be a part of this patient's care.  CSherlon Handing PharmD, BCPS Clinical pharmacist, pager [phone]/18/2018 5:55 AM

## 2017-05-21 NOTE — Progress Notes (Signed)
Pt is alert and oriented with insomnia. And back pain, gave PRN.

## 2017-05-22 DIAGNOSIS — J189 Pneumonia, unspecified organism: Secondary | ICD-10-CM | POA: Diagnosis present

## 2017-05-22 LAB — COMPREHENSIVE METABOLIC PANEL
ALT: 37 U/L (ref 14–54)
ANION GAP: 11 (ref 5–15)
AST: 32 U/L (ref 15–41)
Albumin: 2.8 g/dL — ABNORMAL LOW (ref 3.5–5.0)
Alkaline Phosphatase: 69 U/L (ref 38–126)
BILIRUBIN TOTAL: 0.8 mg/dL (ref 0.3–1.2)
BUN: 43 mg/dL — ABNORMAL HIGH (ref 6–20)
CHLORIDE: 93 mmol/L — AB (ref 101–111)
CO2: 37 mmol/L — ABNORMAL HIGH (ref 22–32)
Calcium: 8.7 mg/dL — ABNORMAL LOW (ref 8.9–10.3)
Creatinine, Ser: 1.24 mg/dL — ABNORMAL HIGH (ref 0.44–1.00)
GFR calc non Af Amer: 40 mL/min — ABNORMAL LOW (ref >60)
GFR, EST AFRICAN AMERICAN: 46 mL/min — AB (ref >60)
Glucose, Bld: 92 mg/dL (ref 65–99)
POTASSIUM: 4.1 mmol/L (ref 3.5–5.1)
Sodium: 141 mmol/L (ref 135–145)
TOTAL PROTEIN: 6.1 g/dL — AB (ref 6.5–8.1)

## 2017-05-22 LAB — CBC
HCT: 44.7 % (ref 36.0–46.0)
Hemoglobin: 14.1 g/dL (ref 12.0–15.0)
MCH: 29.1 pg (ref 26.0–34.0)
MCHC: 31.5 g/dL (ref 30.0–36.0)
MCV: 92.4 fL (ref 78.0–100.0)
Platelets: 136 10*3/uL — ABNORMAL LOW (ref 150–400)
RBC: 4.84 MIL/uL (ref 3.87–5.11)
RDW: 13.8 % (ref 11.5–15.5)
WBC: 8.8 10*3/uL (ref 4.0–10.5)

## 2017-05-22 LAB — GLUCOSE, CAPILLARY
GLUCOSE-CAPILLARY: 101 mg/dL — AB (ref 65–99)
GLUCOSE-CAPILLARY: 93 mg/dL (ref 65–99)
GLUCOSE-CAPILLARY: 205 mg/dL — AB (ref 65–99)
Glucose-Capillary: 198 mg/dL — ABNORMAL HIGH (ref 65–99)
Glucose-Capillary: 189 mg/dL — ABNORMAL HIGH (ref 65–99)

## 2017-05-22 NOTE — Progress Notes (Signed)
CBG = 101 at 02:58. Snack given upon pt's request. Will continue to monitor.

## 2017-05-22 NOTE — Progress Notes (Addendum)
Offered prayer; emotional support.  Will follow, as needed.
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
## 2017-05-22 NOTE — Progress Notes (Signed)
Progress Note  Patient Name: Margaret Dyer Date of Encounter: 05/22/2017  Primary Cardiologist: New ([facility] long ago)  Subjective   The patient feels SOB and out of energy today.   Inpatient Medications    Scheduled Meds: . amLODipine  10 mg Oral Daily  . atorvastatin  20 mg Oral q1800  . enoxaparin (LOVENOX) injection  40 mg Subcutaneous Q24H  . feeding supplement (PRO-STAT SUGAR FREE 64)  30 mL Oral BID  . furosemide  80 mg Intravenous BID  . gabapentin  100 mg Oral BID  . hydrALAZINE  50 mg Oral Q8H  . insulin aspart  0-5 Units Subcutaneous QHS  . insulin aspart  0-9 Units Subcutaneous TID WC  . insulin aspart  15 Units Subcutaneous TID WC  . insulin glargine  50 Units Subcutaneous QHS  . levothyroxine  25 mcg Oral QAC breakfast  . loratadine  10 mg Oral Daily  . metoprolol tartrate  25 mg Oral BID  . oxybutynin  5 mg Oral BH-q7a  . pantoprazole  40 mg Oral Daily  . sodium chloride flush  3 mL Intravenous Q12H  . sodium chloride flush  3 mL Intravenous Q12H   Continuous Infusions: . sodium chloride    . piperacillin-tazobactam (ZOSYN)  IV 3.375 g (05/22/17 0757)  . vancomycin Stopped (05/22/17 2355)   PRN Meds: sodium chloride, acetaminophen **OR** acetaminophen, albuterol, antiseptic oral rinse, calcium carbonate, lip balm, LORazepam, ondansetron **OR** ondansetron (ZOFRAN) IV, sodium chloride flush, traMADol   Vital Signs    Vitals:   05/21/17 1212 05/21/17 2138 05/22/17 0612 05/22/17 1148  BP: (!) 127/51 (!) 135/48 (!) 128/59 (!) 128/50  Pulse: 73 75 74 80  Resp: _0 Temp: 97.9 F (36.6 C) 98.3 F (36.8 C) 97.9 F (36.6 C) 98.3 F (36.8 C)  TempSrc: Oral Oral Oral Oral  SpO2: 98% 97% 95% 98%  Weight:   206 lb 4.8 oz (93.6 kg)   Height:        Intake/Output Summary (Last 24 hours) at 05/22/17 1157 Last data filed at 05/22/17 1149  Gross per 24 hour  Intake              240 ml  Output             4075 ml  Net            -3835 ml    Filed Weights   05/20/17 0426 05/21/17 0606 05/22/17 0612  Weight: 210 lb 1.6 oz (95.3 kg) 204 lb 9.6 oz (92.8 kg) 206 lb 4.8 oz (93.6 kg)    Telemetry    Sinus rhythm with rates in the 70s and brief, few seconds, ectopic atrial tachycardia - Personally Reviewed  ECG    No new tracings  Physical Exam   GEN: Obese female . No acute distress.   Neck: No JVD Cardiac: RRR, no murmurs, rubs, or gallops.  Respiratory:  crackles B/L GI: Soft, obese, non-distended, mild tenderness to palpation in the right upper quadrant MS:  1-2+ pitting lower extremity edema; No deformity. Neuro:  Nonfocal  Psych: Normal affect   Labs    Chemistry  Recent Labs Lab 05/20/17 0455 05/21/17 0527 05/22/17 0303  NA 139 139 141  K 3.7 3.5 4.1  CL 97* 96* 93*  CO2 32 35* 37*  GLUCOSE 110* 58* 92  BUN 46* 44* 43*  CREATININE 1.20* 1.07* 1.24*  CALCIUM 8.2* 8.4* 8.7*  PROT 5.6* 5.4* 6.1*  ALBUMIN  2.4* 2.6* 2.8*  AST 30 25 32  ALT 33 34 37  ALKPHOS 64 63 69  BILITOT 0.8 0.7 0.8  GFRNONAA 42* 48* 40*  GFRAA 48* 55* 46*  ANIONGAP _0 Hematology  Recent Labs Lab 05/20/17 0455 05/21/17 0527 05/22/17 0303  WBC 9.6 9.2 8.8  RBC 4.59 4.56 4.84  HGB 13.8 13.5 14.1  HCT 42.1 41.6 44.7  MCV 91.7 91.2 92.4  MCH 30.1 29.6 29.1  MCHC 32.8 32.5 31.5  RDW 13.8 13.6 13.8  PLT 136* 129* 136*    Cardiac EnzymesNo results for input(s): TROPONINI in the last 168 hours. No results for input(s): TROPIPOC in the last 168 hours.   BNPNo results for input(s): BNP, PROBNP in the last 168 hours.   DDimer No results for input(s): DDIMER in the last 168 hours.   Radiology    Ct Chest High Resolution  Result Date: 05/20/2017 CLINICAL DATA:  Inpatient.  Dyspnea.  Hypoxia.  Lung crackles. EXAM: CT CHEST WITHOUT CONTRAST TECHNIQUE: Multidetector CT imaging of the chest was performed following the standard protocol without intravenous contrast. High resolution imaging of the lungs, as well as  inspiratory and expiratory imaging, was performed. COMPARISON:  05/13/2017 chest CT angiogram. 05/12/2017 chest radiograph. FINDINGS: Cardiovascular: Mild cardiomegaly. No significant pericardial fluid/thickening. Left main, left anterior descending, left circumflex and right coronary atherosclerosis. Atherosclerotic nonaneurysmal thoracic aorta. Dilated main pulmonary artery (3.5 cm diameter). Mediastinum/Nodes: Subcentimeter hypodense right thyroid lobe nodule. Unremarkable esophagus. No pathologically enlarged axillary, mediastinal or gross hilar lymph nodes, noting limited sensitivity for the detection of hilar adenopathy on this noncontrast study. Lungs/Pleura: No pneumothorax. Small dependent bilateral pleural effusions, increased mildly bilaterally since 05/13/2017. Segmental dependent bilateral lower lobe atelectasis. New patchy prominent regions of peribronchovascular ground-glass opacity and consolidation in both lungs, predominantly in the upper lobes, most prominent in the right upper lobe. No discrete significant solid pulmonary nodules in the aerated portions of the lungs. No significant regions of subpleural reticulation, traction bronchiectasis, architectural distortion or frank honeycombing. No evidence of significant air trapping on the limited expiration sequence. Upper abdomen: Diffusely irregular liver surface compatible cirrhosis. Right adrenal 1.5 cm adenoma with density -1 HU. Partially visualized renal cortical scarring in the posterior upper right kidney. Left adrenal 1.4 cm adenoma with density -4 HU. Partially visualized postsurgical changes from left nephrectomy. Musculoskeletal: No aggressive appearing focal osseous lesions. Moderate thoracic spondylosis. IMPRESSION: 1. No evidence of interstitial lung disease. 2. Patchy peribronchovascular ground-glass attenuation and consolidation in the bilateral upper lobes, new since 05/13/2017 chest CT angiogram study, most compatible with  bronchopneumonia, although a component of pulmonary edema may be present given the cardiomegaly. 3. Small dependent bilateral pleural effusions, mildly increased bilaterally. Associated mild bibasilar atelectasis. 4. Hepatic cirrhosis . 5. Bilateral adrenal adenomas. 6. Left main and 3 vessel coronary atherosclerosis . 7. Dilated main pulmonary artery, suggesting pulmonary arterial hypertension. Aortic Atherosclerosis (ICD10-I70.0). Electronically Signed   By: Ilona Sorrel M.D.   On: 05/20/2017 19:04    Cardiac Studies   Right Heart Cath and Coronary Angiography 05/18/17  Conclusion     Hemodynamic findings consistent with moderate-severe pulmonary hypertension.   Severe diastolic heart failure with PCWP of 32 mmHg. Noted large V wave on wedge waveform. Appears to be mostly pulmonary venous congestion, however there is a transpulmonary gradient of 12 mmHg suggesting there is a component of pulmonary disease as well. The presence of a very large aching V wave in PCWP waveforms would  suggest mitral regurgitation.   ______________________________________________________________________________  Echocardiogram 05/13/17 ------------------------------------------------------------------- Study Conclusions  - Left ventricle: The cavity size was normal. Wall thickness was   normal. Systolic function was normal. The estimated ejection   fraction was in the range of 55% to 60%. Features are consistent   with a pseudonormal left ventricular filling pattern, with   concomitant abnormal relaxation and increased filling pressure   (grade 2 diastolic dysfunction). - Left atrium: The atrium was mildly dilated.    Patient Profile     82 y.o. female two-week duration as significant Dyspnea on exertion in the setting of chronic lower extremity edema  Assessment & Plan    Acute on chronic diastolic CHF -Echocardiogram on 05/13/17 showed EF 55-60% and pseudo-normal left ventricular filling pattern  with concomitant abnormal relaxation and increased filling pressure, grade 2 diastolic dysfunction -Chest x-ray on 05/12/17 showed interstitial prominence noted of the lungs bilaterally which may reflect chronic interstitial lung disease, mild interstitial edema, aortic atherosclerosis -Right heart cath on 05/18/17 showed moderate to severe pulmonary hypertension, severe diastolic heart failure with elevated PCWP of 32 mmHg and large V waves (but no significant MR). Transpulmonary gradient of 12 mmHg suggesting a component of pulmonary disease. CT high-resolution on 05/19/17 was negative for interstitial lung disease. Per internal medicine note Dr. Valli Glance . The patient has airspace disease -Patient was not taking Lasix at home due to frequent urination. Will need to take diuretic at home. Discussed with patient and she understands. -Continuing Lasix 80 mg IV twice a day -Patient is hemodynamically stable. Weight is down from a max of 217 pounds to 204 pounds today (down 6 pounds from yesterday).  -Fluid status is down 8.8 L since admission with -3.8 L over the last 24 hours, weight unchanged -No orthopnea, but continued dyspnea on minimal exertion. Is using oxygen but had O2 sat of 91% on walking test. Still has 1-2+ pitting lower extremity edema and fine scattered crackles  Acute kidney injury -History of nephrectomy secondary to renal cell carcinoma in 2000 -Serum creatinine 1.07 today (1.20 yesterday)  Hypertension - Moderately well controlled, ARB on hold, continuing Lopressor and Norvasc. Hydralazine increased to 50 mg 3 times a day per internal medicine  Diabetes type 2 -Management per internal medicine  Hyperlipidemia -History of cirrhosis but has normal LFTs now -Lipid panel on 05/14/2017: Total cholesterol 218, LDL 143. Statin initiated  82 year old female admitted with acute on chronic diastolic CHF sec to diuretics non-compliance (bothered by frequent urination), R sided cath on 6/15  showed high PCWP and moderate to severe pulmonary hypertension, good response to iv diuretics with negative 8.8 liters in 5 days and 1.8 L in the last 24 hours. She is now 206 lbs, her baseline is 206 lbs, however there is still evidence of fluid overload and O2 desaturation while walking. She was also diagnosed with bronchopneumonia on chest CT and started on Vanco/Zosyn. I will continue iv Lasix at least till tomorrow and re- evaluate. Crea is improving, potassium now normal.   Ena Dawley, MD 05/22/2017

## 2017-05-23 DIAGNOSIS — F419 Anxiety disorder, unspecified: Secondary | ICD-10-CM

## 2017-05-23 LAB — CBC
HCT: 40.8 % (ref 36.0–46.0)
Hemoglobin: 13.2 g/dL (ref 12.0–15.0)
MCH: 29.7 pg (ref 26.0–34.0)
MCHC: 32.4 g/dL (ref 30.0–36.0)
MCV: 91.7 fL (ref 78.0–100.0)
PLATELETS: 125 10*3/uL — AB (ref 150–400)
RBC: 4.45 MIL/uL (ref 3.87–5.11)
RDW: 13.7 % (ref 11.5–15.5)
WBC: 10.4 10*3/uL (ref 4.0–10.5)

## 2017-05-23 LAB — COMPREHENSIVE METABOLIC PANEL
ALBUMIN: 2.7 g/dL — AB (ref 3.5–5.0)
ALT: 34 U/L (ref 14–54)
ANION GAP: 12 (ref 5–15)
AST: 27 U/L (ref 15–41)
Alkaline Phosphatase: 65 U/L (ref 38–126)
BUN: 39 mg/dL — ABNORMAL HIGH (ref 6–20)
CO2: 31 mmol/L (ref 22–32)
Calcium: 8.5 mg/dL — ABNORMAL LOW (ref 8.9–10.3)
Chloride: 93 mmol/L — ABNORMAL LOW (ref 101–111)
Creatinine, Ser: 1.43 mg/dL — ABNORMAL HIGH (ref 0.44–1.00)
GFR calc Af Amer: 39 mL/min — ABNORMAL LOW (ref >60)
GFR calc non Af Amer: 34 mL/min — ABNORMAL LOW (ref >60)
GLUCOSE: 176 mg/dL — AB (ref 65–99)
POTASSIUM: 3.5 mmol/L (ref 3.5–5.1)
SODIUM: 136 mmol/L (ref 135–145)
TOTAL PROTEIN: 5.7 g/dL — AB (ref 6.5–8.1)
Total Bilirubin: 0.8 mg/dL (ref 0.3–1.2)

## 2017-05-23 LAB — GLUCOSE, CAPILLARY
GLUCOSE-CAPILLARY: 151 mg/dL — AB (ref 65–99)
GLUCOSE-CAPILLARY: 143 mg/dL — AB (ref 65–99)
Glucose-Capillary: 155 mg/dL — ABNORMAL HIGH (ref 65–99)
Glucose-Capillary: 106 mg/dL — ABNORMAL HIGH (ref 65–99)
Glucose-Capillary: 301 mg/dL — ABNORMAL HIGH (ref 65–99)

## 2017-05-23 MED ORDER — ALPRAZOLAM 0.25 MG PO TABS
0.2500 mg | ORAL_TABLET | Freq: Three times a day (TID) | ORAL | Status: DC
  Administered 2017-05-23 – 2017-05-24 (×2): 0.25 mg via ORAL
  Filled 2017-05-23 (×2): qty 1

## 2017-05-23 MED ORDER — LEVOFLOXACIN 750 MG PO TABS
750.0000 mg | ORAL_TABLET | ORAL | Status: DC
  Administered 2017-05-23: 750 mg via ORAL
  Filled 2017-05-23: qty 1

## 2017-05-23 MED ORDER — ALPRAZOLAM 0.25 MG PO TABS
0.2500 mg | ORAL_TABLET | Freq: Two times a day (BID) | ORAL | Status: DC
  Administered 2017-05-23: 0.25 mg via ORAL
  Filled 2017-05-23: qty 1

## 2017-05-23 NOTE — Progress Notes (Signed)
SATURATION QUALIFICATIONS: (This note is used to comply with regulatory documentation for home oxygen)  Patient Saturations on Room Air at Rest = 92%  Patient Saturations on Room Air while Ambulating = 89%  Please briefly explain why patient needs home oxygen: Pt didn't require 02 SpO2 increased on RA 94%

## 2017-05-23 NOTE — Progress Notes (Signed)
Pharmacy Antibiotic Note  Margaret Dyer is a 82 y.o. female admitted on 05/13/2017 with SOB and concern for HCAP vs HF exacerbation.  Pt previously on Vancomycin and Zosyn >>  to change therapy to Levaquin.   Plan: Levaquin 746m PO q48h  Height: 5' 0.5" (153.7 cm) Weight: 207 lb 4.8 oz (94 kg) IBW/kg (Calculated) : 46.65  Temp (24hrs), Avg:98.5 F (36.9 C), Min:98 F (36.7 C), Max:98.7 F (37.1 C)   Recent Labs Lab 05/19/17 0333 05/20/17 0455 05/21/17 0527 05/22/17 0303 05/23/17 0234  WBC 12.2* 9.6 9.2 8.8 10.4  CREATININE 1.12* 1.20* 1.07* 1.24* 1.43*    Estimated Creatinine Clearance: 32.5 mL/min (A) (by C-G formula based on SCr of 1.43 mg/dL (H)).    Allergies  Allergen Reactions  . Cefuroxime Axetil     Upset stomach   . Ciprofloxacin     Upset stomach    Thank you for allowing pharmacy to be a part of this patient's care.  [employer] Pharm.D., BCPS Clinical Pharmacist Pager: [phone]/20/2018 3:44 PM

## 2017-05-23 NOTE — Care Management Important Message (Signed)
Important Message  Patient Details  Name: Margaret Dyer MRN: [id number] Date of Birth: 14-Aug-1935   Medicare Important Message Given:  Yes    [name] 05/23/2017, 11:26 AM
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
## 2017-05-23 NOTE — Progress Notes (Signed)
Pt and daughters tonight continue to express uneasiness with the pt potentially being discharged without home oxygen. Will continue to monitor.

## 2017-05-23 NOTE — Progress Notes (Signed)
Progress Note  Patient Name: Margaret Dyer Date of Encounter: 05/23/2017  Primary Cardiologist: Reola Calkins ([facility] long ago)  Subjective   The patient feels better today, SpO2 still down to 89% with walking.   Inpatient Medications    Scheduled Meds: . ALPRAZolam  0.25 mg Oral BID  . amLODipine  10 mg Oral Daily  . atorvastatin  20 mg Oral q1800  . enoxaparin (LOVENOX) injection  40 mg Subcutaneous Q24H  . feeding supplement (PRO-STAT SUGAR FREE 64)  30 mL Oral BID  . furosemide  80 mg Intravenous BID  . gabapentin  100 mg Oral BID  . hydrALAZINE  50 mg Oral Q8H  . insulin aspart  0-5 Units Subcutaneous QHS  . insulin aspart  0-9 Units Subcutaneous TID WC  . insulin aspart  15 Units Subcutaneous TID WC  . insulin glargine  50 Units Subcutaneous QHS  . levothyroxine  25 mcg Oral QAC breakfast  . loratadine  10 mg Oral Daily  . metoprolol tartrate  25 mg Oral BID  . oxybutynin  5 mg Oral BH-q7a  . pantoprazole  40 mg Oral Daily  . sodium chloride flush  3 mL Intravenous Q12H  . sodium chloride flush  3 mL Intravenous Q12H   Continuous Infusions: . sodium chloride    . piperacillin-tazobactam (ZOSYN)  IV Stopped (05/23/17 1049)  . vancomycin 1,250 mg (05/23/17 0512)   PRN Meds: sodium chloride, acetaminophen **OR** acetaminophen, albuterol, antiseptic oral rinse, calcium carbonate, lip balm, LORazepam, ondansetron **OR** ondansetron (ZOFRAN) IV, sodium chloride flush, traMADol   Vital Signs    Vitals:   05/23/17 0055 05/23/17 0455 05/23/17 0810 05/23/17 1126  BP:  (!) 138/52 (!) 120/50 (!) 134/52  Pulse:  69 69 78  Resp:  _0 Temp:  98 F (36.7 C) 98.7 F (37.1 C) 98.7 F (37.1 C)  TempSrc:  Oral Oral Oral  SpO2: 96% 96% 96% 91%  Weight:  207 lb 4.8 oz (94 kg)    Height:        Intake/Output Summary (Last 24 hours) at 05/23/17 1331 Last data filed at 05/23/17 1125  Gross per 24 hour  Intake             1170 ml  Output             1800 ml  Net              -630 ml   Filed Weights   05/21/17 0606 05/22/17 0612 05/23/17 0455  Weight: 204 lb 9.6 oz (92.8 kg) 206 lb 4.8 oz (93.6 kg) 207 lb 4.8 oz (94 kg)    Telemetry    Sinus rhythm with rates in the 70s and brief, few seconds, ectopic atrial tachycardia - Personally Reviewed  ECG    No new tracings  Physical Exam   GEN: Obese female . No acute distress.   Neck: No JVD Cardiac: RRR, no murmurs, rubs, or gallops.  Respiratory:  crackles B/L GI: Soft, obese, non-distended, mild tenderness to palpation in the right upper quadrant MS:  1-2+ pitting lower extremity edema; No deformity. Neuro:  Nonfocal  Psych: Normal affect   Labs    Chemistry  Recent Labs Lab 05/21/17 0527 05/22/17 0303 05/23/17 0234  NA 139 141 136  K 3.5 4.1 3.5  CL 96* 93* 93*  CO2 35* 37* 31  GLUCOSE 58* 92 176*  BUN 44* 43* 39*  CREATININE 1.07* 1.24* 1.43*  CALCIUM 8.4* 8.7* 8.5*  PROT 5.4* 6.1* 5.7*  ALBUMIN 2.6* 2.8* 2.7*  AST 25 32 27  ALT 34 37 34  ALKPHOS 63 69 65  BILITOT 0.7 0.8 0.8  GFRNONAA 48* 40* 34*  GFRAA 55* 46* 39*  ANIONGAP _0 Hematology  Recent Labs Lab 05/21/17 0527 05/22/17 0303 05/23/17 0234  WBC 9.2 8.8 10.4  RBC 4.56 4.84 4.45  HGB 13.5 14.1 13.2  HCT 41.6 44.7 40.8  MCV 91.2 92.4 91.7  MCH 29.6 29.1 29.7  MCHC 32.5 31.5 32.4  RDW 13.6 13.8 13.7  PLT 129* 136* 125*    Cardiac EnzymesNo results for input(s): TROPONINI in the last 168 hours. No results for input(s): TROPIPOC in the last 168 hours.   BNPNo results for input(s): BNP, PROBNP in the last 168 hours.   DDimer No results for input(s): DDIMER in the last 168 hours.   Radiology    No results found.  Cardiac Studies   Right Heart Cath and Coronary Angiography 05/18/17  Conclusion     Hemodynamic findings consistent with moderate-severe pulmonary hypertension.   Severe diastolic heart failure with PCWP of 32 mmHg. Noted large V wave on wedge waveform. Appears to be mostly  pulmonary venous congestion, however there is a transpulmonary gradient of 12 mmHg suggesting there is a component of pulmonary disease as well. The presence of a very large aching V wave in PCWP waveforms would suggest mitral regurgitation.   ______________________________________________________________________________  Echocardiogram 05/13/17 ------------------------------------------------------------------- Study Conclusions  - Left ventricle: The cavity size was normal. Wall thickness was   normal. Systolic function was normal. The estimated ejection   fraction was in the range of 55% to 60%. Features are consistent   with a pseudonormal left ventricular filling pattern, with   concomitant abnormal relaxation and increased filling pressure   (grade 2 diastolic dysfunction). - Left atrium: The atrium was mildly dilated.    Patient Profile     82 y.o. female two-week duration as significant Dyspnea on exertion in the setting of chronic lower extremity edema  Assessment & Plan    Acute on chronic diastolic CHF -Echocardiogram on 05/13/17 showed EF 55-60% and pseudo-normal left ventricular filling pattern with concomitant abnormal relaxation and increased filling pressure, grade 2 diastolic dysfunction -Chest x-ray on 05/12/17 showed interstitial prominence noted of the lungs bilaterally which may reflect chronic interstitial lung disease, mild interstitial edema, aortic atherosclerosis -Right heart cath on 05/18/17 showed moderate to severe pulmonary hypertension, severe diastolic heart failure with elevated PCWP of 32 mmHg and large V waves (but no significant MR). Transpulmonary gradient of 12 mmHg suggesting a component of pulmonary disease. CT high-resolution on 05/19/17 was negative for interstitial lung disease.  -Patient was not taking Lasix at home due to frequent urination. Will need to take diuretic at home. Discussed with patient and she understands. -Crea inceasing today  1.0-1.2-.1.4, I will hold lasix tonight and start lasix 40 mg po BID on discharge, possibly tomorrow, Weight at 206, baseline 204 lbs - ATB started to work, she is feeling better  Acute kidney injury -History of nephrectomy secondary to renal cell carcinoma in 2000 -as above  Hypertension - controlled  Diabetes type 2 -Management per internal medicine  Hyperlipidemia -History of cirrhosis but has normal LFTs now -Lipid panel on 05/14/2017: Total cholesterol 218, LDL 143. Statin initiated   Ena Dawley, MD 05/23/2017

## 2017-05-23 NOTE — Evaluation (Signed)
Physical Therapy Evaluation Patient Details Name: GWENDOLIN BRIEL MRN: [id number] DOB: 03-Jul-1935 Today's Date: 05/23/2017   History of Present Illness  Patient is a 82 y/o female who presents with DOE in the setting of chronic LE edema. Found to have Acute on chronic diastolic CHF. Chest CT-Small dependent bilateral pleural effusions. s/p cardiac cath. PMH includes renal cell carcinoma, HTN, HLD, DM.   Clinical Impression  Patient presents with dyspnea on exertion, drop in SP02 with mobility, decreased endurance and impaired mobility s/p above. Pt Mod I PTA and driving. Tolerated gait training with min guard assist for safety. Sp02 dropped to 84% on RA during mobility. Education re: pursed lip breathing, energy conservation techniques, activity progression. Pt plans to d/c home with daughter. Plan for stair training tomorrow as tolerated. Encouraged ambulation with RN later in day with 02. Will follow acutely to maximize independence and mobility prior to return home.     Follow Up Recommendations No PT follow up;Supervision for mobility/OOB;Supervision/Assistance - 24 hour    Equipment Recommendations  None recommended by PT    Recommendations for Other Services       Precautions / Restrictions Precautions Precautions: Fall Precaution Comments: watch 02 Restrictions Weight Bearing Restrictions: No      Mobility  Bed Mobility Overal bed mobility: Needs Assistance Bed Mobility: Supine to Sit     Supine to sit: Supervision;HOB elevated     General bed mobility comments: No assist needed. Use of rail for support. No dizziness.  Transfers Overall transfer level: Needs assistance Equipment used: Rolling walker (2 wheeled) Transfers: Sit to/from Stand Sit to Stand: Supervision         General transfer comment: Supervision for safety.   Ambulation/Gait Ambulation/Gait assistance: Min guard Ambulation Distance (Feet): 150 Feet Assistive device: Rolling walker (2  wheeled) Gait Pattern/deviations: Decreased stride length;Step-through pattern;Trunk flexed Gait velocity: decreased Gait velocity interpretation: <1.8 ft/sec, indicative of risk for recurrent falls General Gait Details: Slow, mildly unsteady gait. 2/4 DOE. Sp02 dropped to 84% on RA. Will need 02 for mobility. 2 standing rest breaks with cues for pursed lip breathing.  Stairs            Wheelchair Mobility    Modified Rankin (Stroke Patients Only)       Balance Overall balance assessment: Needs assistance Sitting-balance support: Feet supported;No upper extremity supported Sitting balance-Leahy Scale: Fair     Standing balance support: During functional activity;Bilateral upper extremity supported Standing balance-Leahy Scale: Poor Standing balance comment: Reliant  on BUEs for support in standing.                             Pertinent Vitals/Pain Pain Assessment: No/denies pain    Home Living Family/patient expects to be discharged to:: Private residence Living Arrangements: Children Available Help at Discharge: Family;Available 24 hours/day Type of Home: House Home Access: Stairs to enter Entrance Stairs-Rails: None Entrance Stairs-Number of Steps: 2-3 Home Layout: One level Home Equipment: Walker - 2 wheels;Bedside commode Additional Comments: Above information daughter;s house where pt is being d/ced too.    Prior Function Level of Independence: Independent with assistive device(s)         Comments: Uses RW PRN at home. Drives. Cooks. Does not do much cleaning.      Hand Dominance        Extremity/Trunk Assessment   Upper Extremity Assessment Upper Extremity Assessment: Defer to OT evaluation    Lower Extremity Assessment Lower  Extremity Assessment: Generalized weakness       Communication   Communication: No difficulties  Cognition Arousal/Alertness: Awake/alert Behavior During Therapy: WFL for tasks assessed/performed Overall  Cognitive Status: Within Functional Limits for tasks assessed                                        General Comments      Exercises     Assessment/Plan    PT Assessment Patient needs continued PT services  PT Problem List Decreased mobility;Cardiopulmonary status limiting activity;Decreased balance;Decreased activity tolerance       PT Treatment Interventions Therapeutic activities;Gait training;Therapeutic exercise;Patient/family education;Balance training;Functional mobility training;Stair training    PT Goals (Current goals can be found in the Care Plan section)  Acute Rehab PT Goals Patient Stated Goal: to feel better PT Goal Formulation: With patient Time For Goal Achievement: 06/06/17 Potential to Achieve Goals: Good    Frequency Min 3X/week   Barriers to discharge Inaccessible home environment stairs to enter home    Co-evaluation               AM-PAC PT "6 Clicks" Daily Activity  Outcome Measure Difficulty turning over in bed (including adjusting bedclothes, sheets and blankets)?: None Difficulty moving from lying on back to sitting on the side of the bed? : None Difficulty sitting down on and standing up from a chair with arms (e.g., wheelchair, bedside commode, etc,.)?: None Help needed moving to and from a bed to chair (including a wheelchair)?: None Help needed walking in hospital room?: A Little Help needed climbing 3-5 steps with a railing? : A Lot 6 Click Score: 21    End of Session Equipment Utilized During Treatment: Gait belt Activity Tolerance: Treatment limited secondary to medical complications (Comment) (drop in Sp02 with mobility ) Patient left: in bed;with call bell/phone within reach Nurse Communication: Mobility status PT Visit Diagnosis: Muscle weakness (generalized) (M62.81);Other (comment) (dyspnea)    Time: 4599-7741 PT Time Calculation (min) (ACUTE ONLY): 19 min   Charges:   PT Evaluation $PT Eval Moderate  Complexity: 1 Procedure     PT G Codes:        Wray Kearns, PT, DPT [phone]    Marguarite Arbour A Case Vassell 05/23/2017, 2:30 PM
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
## 2017-05-24 ENCOUNTER — Other Ambulatory Visit: Payer: Self-pay | Admitting: Cardiology

## 2017-05-24 DIAGNOSIS — E876 Hypokalemia: Secondary | ICD-10-CM

## 2017-05-24 DIAGNOSIS — J9621 Acute and chronic respiratory failure with hypoxia: Secondary | ICD-10-CM

## 2017-05-24 DIAGNOSIS — F419 Anxiety disorder, unspecified: Secondary | ICD-10-CM | POA: Diagnosis present

## 2017-05-24 DIAGNOSIS — I50811 Acute right heart failure: Secondary | ICD-10-CM

## 2017-05-24 DIAGNOSIS — I5031 Acute diastolic (congestive) heart failure: Secondary | ICD-10-CM

## 2017-05-24 DIAGNOSIS — R0600 Dyspnea, unspecified: Secondary | ICD-10-CM

## 2017-05-24 DIAGNOSIS — J9610 Chronic respiratory failure, unspecified whether with hypoxia or hypercapnia: Secondary | ICD-10-CM | POA: Diagnosis present

## 2017-05-24 DIAGNOSIS — J9611 Chronic respiratory failure with hypoxia: Secondary | ICD-10-CM | POA: Diagnosis present

## 2017-05-24 DIAGNOSIS — E784 Other hyperlipidemia: Secondary | ICD-10-CM

## 2017-05-24 LAB — GLUCOSE, CAPILLARY
GLUCOSE-CAPILLARY: 73 mg/dL (ref 65–99)
Glucose-Capillary: 206 mg/dL — ABNORMAL HIGH (ref 65–99)

## 2017-05-24 LAB — BASIC METABOLIC PANEL
Anion gap: 9 (ref 5–15)
BUN: 33 mg/dL — AB (ref 6–20)
CALCIUM: 8.6 mg/dL — AB (ref 8.9–10.3)
CO2: 34 mmol/L — ABNORMAL HIGH (ref 22–32)
CREATININE: 1.27 mg/dL — AB (ref 0.44–1.00)
Chloride: 92 mmol/L — ABNORMAL LOW (ref 101–111)
GFR calc Af Amer: 45 mL/min — ABNORMAL LOW (ref >60)
GFR, EST NON AFRICAN AMERICAN: 39 mL/min — AB (ref >60)
GLUCOSE: 74 mg/dL (ref 65–99)
Potassium: 3.2 mmol/L — ABNORMAL LOW (ref 3.5–5.1)
SODIUM: 135 mmol/L (ref 135–145)

## 2017-05-24 MED ORDER — POTASSIUM CHLORIDE CRYS ER 20 MEQ PO TBCR
40.0000 meq | EXTENDED_RELEASE_TABLET | Freq: Once | ORAL | Status: AC
  Administered 2017-05-24: 40 meq via ORAL
  Filled 2017-05-24: qty 2

## 2017-05-24 MED ORDER — FUROSEMIDE 40 MG PO TABS
40.0000 mg | ORAL_TABLET | Freq: Two times a day (BID) | ORAL | Status: DC
  Administered 2017-05-24: 40 mg via ORAL
  Filled 2017-05-24: qty 1

## 2017-05-24 MED ORDER — FUROSEMIDE 80 MG PO TABS: 80.0000 mg | ORAL_TABLET | Freq: Two times a day (BID) | ORAL | Status: DC

## 2017-05-24 MED ORDER — ALPRAZOLAM 0.5 MG PO TABS: 0.5000 mg | ORAL_TABLET | Freq: Three times a day (TID) | ORAL | 0 refills | Status: DC | PRN

## 2017-05-24 MED ORDER — LEVOFLOXACIN 750 MG PO TABS: 750.0000 mg | ORAL_TABLET | ORAL | 0 refills | Status: DC

## 2017-05-24 MED ORDER — POTASSIUM CHLORIDE ER 10 MEQ PO TBCR: 40.0000 meq | EXTENDED_RELEASE_TABLET | Freq: Every day | ORAL | Status: DC

## 2017-05-24 MED ORDER — ALPRAZOLAM 0.5 MG PO TABS: 0.2500 mg | ORAL_TABLET | Freq: Three times a day (TID) | ORAL | 0 refills | Status: DC | PRN

## 2017-05-24 MED ORDER — PRO-STAT SUGAR FREE PO LIQD: 30.0000 mL | Freq: Two times a day (BID) | ORAL | 0 refills | Status: DC

## 2017-05-24 MED ORDER — POTASSIUM CHLORIDE ER 10 MEQ PO TBCR
20.0000 meq | EXTENDED_RELEASE_TABLET | Freq: Every day | ORAL | Status: DC
  Filled 2017-05-24: qty 2

## 2017-05-24 MED ORDER — POTASSIUM CHLORIDE ER 20 MEQ PO TBCR: 20.0000 meq | EXTENDED_RELEASE_TABLET | Freq: Every day | ORAL | 0 refills | Status: DC

## 2017-05-24 MED ORDER — HYDRALAZINE HCL 50 MG PO TABS: 50.0000 mg | ORAL_TABLET | Freq: Three times a day (TID) | ORAL | 0 refills | Status: DC

## 2017-05-24 MED ORDER — FUROSEMIDE 40 MG PO TABS: 40.0000 mg | ORAL_TABLET | Freq: Two times a day (BID) | ORAL | 0 refills | Status: DC

## 2017-05-24 MED ORDER — ATORVASTATIN CALCIUM 20 MG PO TABS: 20.0000 mg | ORAL_TABLET | Freq: Every day | ORAL | 0 refills | Status: DC

## 2017-05-24 MED ORDER — FUROSEMIDE 40 MG PO TABS: 40.0000 mg | ORAL_TABLET | Freq: Every day | ORAL | 0 refills | Status: DC

## 2017-05-24 NOTE — Discharge Instructions (Signed)
Heart Failure Heart failure is a condition in which the heart has trouble pumping blood because it has become weak or stiff. This means that the heart does not pump blood efficiently for the body to work well. For some people with heart failure, fluid may back up into the lungs and there may be swelling (edema) in the lower legs. Heart failure is usually a long-term (chronic) condition. It is important for you to take good care of yourself and follow the treatment plan from your health care provider. What are the causes? This condition is caused by some health problems, including:  High blood pressure (hypertension). Hypertension causes the heart muscle to work harder than normal. High blood pressure eventually causes the heart to become stiff and weak.  Coronary artery disease (CAD). CAD is the buildup of cholesterol and fat (plaques) in the arteries of the heart.  Heart attack (myocardial infarction). Injured tissue, which is caused by the heart attack, does not contract as well and the heart's ability to pump blood is weakened.  Abnormal heart valves. When the heart valves do not open and close properly, the heart muscle must pump harder to keep the blood flowing.  Heart muscle disease (cardiomyopathy or myocarditis). Heart muscle disease is damage to the heart muscle from a variety of causes, such as drug or alcohol abuse, infections, or unknown causes. These can increase the risk of heart failure.  Lung disease. When the lungs do not work properly, the heart must work harder.  What increases the risk? Risk of heart failure increases as a person ages. This condition is also more likely to develop in people who:  Are overweight.  Are female.  Smoke or chew tobacco.  Abuse alcohol or illegal drugs.  Have taken medicines that can damage the heart, such as chemotherapy drugs.  Have diabetes. ? High blood sugar (glucose) is associated with high fat (lipid) levels in the blood. ? Diabetes  can also damage tiny blood vessels that carry nutrients to the heart muscle.  Have abnormal heart rhythms.  Have thyroid problems.  Have low blood counts (anemia).  What are the signs or symptoms? Symptoms of this condition include:  Shortness of breath with activity, such as when climbing stairs.  Persistent cough.  Swelling of the feet, ankles, legs, or abdomen.  Unexplained weight gain.  Difficulty breathing when lying flat (orthopnea).  Waking from sleep because of the need to sit up and get more air.  Rapid heartbeat.  Fatigue and loss of energy.  Feeling light-headed, dizzy, or close to fainting.  Loss of appetite.  Nausea.  Increased urination during the night (nocturia).  Confusion.  How is this diagnosed? This condition is diagnosed based on:  Medical history, symptoms, and a physical exam.  Diagnostic tests, which may include: ? Echocardiogram. ? Electrocardiogram (ECG). ? Chest X-ray. ? Blood tests. ? Exercise stress test. ? Radionuclide scans. ? Cardiac catheterization and angiogram.  How is this treated? Treatment for this condition is aimed at managing the symptoms of heart failure. Medicines, behavioral changes, or other treatments may be necessary to treat heart failure. Medicines These may include:  Angiotensin-converting enzyme (ACE) inhibitors. This type of medicine blocks the effects of a blood protein called angiotensin-converting enzyme. ACE inhibitors relax (dilate) the blood vessels and help to lower blood pressure.  Angiotensin receptor blockers (ARBs). This type of medicine blocks the actions of a blood protein called angiotensin. ARBs dilate the blood vessels and help to lower blood pressure.  Water  pills (diuretics). Diuretics cause the kidneys to remove salt and water from the blood. The extra fluid is removed through urination, leaving a lower volume of blood that the heart has to pump.  Beta blockers. These improve heart  muscle strength and they prevent the heart from beating too quickly.  Digoxin. This increases the force of the heartbeat.  Healthy behavior changes These may include:  Reaching and maintaining a healthy weight.  Stopping smoking or chewing tobacco.  Eating heart-healthy foods.  Limiting or avoiding alcohol.  Stopping use of street drugs (illegal drugs).  Physical activity.  Other treatments These may include:  Surgery to open blocked coronary arteries or repair damaged heart valves.  Placement of a biventricular pacemaker to improve heart muscle function (cardiac resynchronization therapy). This device paces both the right ventricle and left ventricle.  Placement of a device to treat serious abnormal heart rhythms (implantable cardioverter defibrillator, or ICD).  Placement of a device to improve the pumping ability of the heart (left ventricular assist device, or LVAD).  Heart transplant. This can cure heart failure, and it is considered for certain patients who do not improve with other therapies.  Follow these instructions at home: Medicines  Take over-the-counter and prescription medicines only as told by your health care provider. Medicines are important in reducing the workload of your heart, slowing the progression of heart failure, and improving your symptoms. ? Do not stop taking your medicine unless your health care provider told you to do that. ? Do not skip any dose of medicine. ? Refill your prescriptions before you run out of medicine. You need your medicines every day. Eating and drinking   Eat heart-healthy foods. Talk with a dietitian to make an eating plan that is right for you. ? Choose foods that contain no trans fat and are low in saturated fat and cholesterol. Healthy choices include fresh or frozen fruits and vegetables, fish, lean meats, legumes, fat-free or low-fat dairy products, and whole-grain or high-fiber foods. ? Limit salt (sodium) if  directed by your health care provider. Sodium restriction may reduce symptoms of heart failure. Ask a dietitian to recommend heart-healthy seasonings. ? Use healthy cooking methods instead of frying. Healthy methods include roasting, grilling, broiling, baking, poaching, steaming, and stir-frying.  Limit your fluid intake if directed by your health care provider. Fluid restriction may reduce symptoms of heart failure. Lifestyle  Stop smoking or using chewing tobacco. Nicotine and tobacco can damage your heart and your blood vessels. Do not use nicotine gum or patches before talking to your health care provider.  Limit alcohol intake to no more than 1 drink per day for non-pregnant women and 2 drinks per day for men. One drink equals 12 oz of beer, 5 oz of wine, or 1 oz of hard liquor. ? Drinking more than that is harmful to your heart. Tell your health care provider if you drink alcohol several times a week. ? Talk with your health care provider about whether any level of alcohol use is safe for you. ? If your heart has already been damaged by alcohol or you have severe heart failure, drinking alcohol should be stopped completely.  Stop use of illegal drugs.  Lose weight if directed by your health care provider. Weight loss may reduce symptoms of heart failure.  Do moderate physical activity if directed by your health care provider. People who are elderly and people with severe heart failure should consult with a health care provider for physical activity recommendations.  Monitor important information  Weigh yourself every day. Keeping track of your weight daily helps you to notice excess fluid sooner. ? Weigh yourself every morning after you urinate and before you eat breakfast. ? Wear the same amount of clothing each time you weigh yourself. ? Record your daily weight. Provide your health care provider with your weight record.  Monitor and record your blood pressure as told by your health  care provider.  Check your pulse as told by your health care provider. Dealing with extreme temperatures  If the weather is extremely hot: ? Avoid vigorous physical activity. ? Use air conditioning or fans or seek a cooler location. ? Avoid caffeine and alcohol. ? Wear loose-fitting, lightweight, and light-colored clothing.  If the weather is extremely cold: ? Avoid vigorous physical activity. ? Layer your clothes. ? Wear mittens or gloves, a hat, and a scarf when you go outside. ? Avoid alcohol. General instructions  Manage other health conditions such as hypertension, diabetes, thyroid disease, or abnormal heart rhythms as told by your health care provider.  Learn to manage stress. If you need help to do this, ask your health care provider.  Plan rest periods when fatigued.  Get ongoing education and support as needed.  Participate in or seek rehabilitation as needed to maintain or improve independence and quality of life.  Stay up to date with immunizations. Keeping current on pneumococcal and influenza immunizations is especially important to prevent respiratory infections.  Keep all follow-up visits as told by your health care provider. This is important. Contact a health care provider if:  You have a rapid weight gain.  You have increasing shortness of breath that is unusual for you.  You are unable to participate in your usual physical activities.  You tire easily.  You cough more than normal, especially with physical activity.  You have any swelling or more swelling in areas such as your hands, feet, ankles, or abdomen.  You are unable to sleep because it is hard to breathe.  You feel like your heart is beating quickly (palpitations).  You become dizzy or light-headed when you stand up. Get help right away if:  You have difficulty breathing.  You notice or your family notices a change in your awareness, such as having trouble staying awake or having  difficulty with concentration.  You have pain or discomfort in your chest.  You have an episode of fainting (syncope). This information is not intended to replace advice given to you by your health care provider. Make sure you discuss any questions you have with your health care provider. Document Released: 11/20/2005 Document Revised: 07/25/2016 Document Reviewed: 06/14/2016 [employer] Interactive Patient Education  2017 [employer].
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
## 2017-05-24 NOTE — Progress Notes (Signed)
Physical Therapy Treatment Patient Details Name: Margaret Dyer MRN: [id number] DOB: 1935-02-28 Today's Date: 05/24/2017    History of Present Illness Patient is a 82 y/o female who presents with DOE in the setting of chronic LE edema. Found to have Acute on chronic diastolic CHF. Chest CT-Small dependent bilateral pleural effusions. s/p cardiac cath. PMH includes renal cell carcinoma, HTN, HLD, DM.     PT Comments    Patient is making progress toward mobility goals. SpO2 down to 88% on RA with ambulation and up to 94% with rest break. Pt educated on pursed lip breathing and energy conservation techniques and encouraged to continue use of IS.    Follow Up Recommendations  No PT follow up;Supervision for mobility/OOB;Supervision/Assistance - 24 hour     Equipment Recommendations  None recommended by PT    Recommendations for Other Services       Precautions / Restrictions Precautions Precautions: Fall Precaution Comments: watch 02    Mobility  Bed Mobility Overal bed mobility: Modified Independent Bed Mobility: Supine to Sit           General bed mobility comments: HOB elevated slightly; increased time  Transfers Overall transfer level: Needs assistance Equipment used: Rolling walker (2 wheeled) Transfers: Sit to/from Stand Sit to Stand: Supervision         General transfer comment: Supervision for safety.   Ambulation/Gait Ambulation/Gait assistance: Supervision Ambulation Distance (Feet):  (249f total with seated and standing rest breaks) Assistive device: Rolling walker (2 wheeled) Gait Pattern/deviations: Decreased stride length;Step-through pattern;Trunk flexed Gait velocity: decreased   General Gait Details: pt with c/o SOB with ambulationl; SpO2 down to 88% on RA with ambulation; cues for pursed lip breathing   Stairs Stairs: Yes   Stair Management: One rail Left;Step to pattern;Forwards Number of Stairs: 3 General stair comments: cues for step to  pattern; standing rest break required post descending stairs due to SOB  Wheelchair Mobility    Modified Rankin (Stroke Patients Only)       Balance                                            Cognition Arousal/Alertness: Awake/alert Behavior During Therapy: WFL for tasks assessed/performed Overall Cognitive Status: Within Functional Limits for tasks assessed                                        Exercises      General Comments General comments (skin integrity, edema, etc.): pt educated on energy conservation techniques and given handout and encouraged to continue use of IS       Pertinent Vitals/Pain Pain Assessment: No/denies pain    Home Living                      Prior Function            PT Goals (current goals can now be found in the care plan section) Acute Rehab PT Goals Patient Stated Goal: to feel better Progress towards PT goals: Progressing toward goals    Frequency    Min 3X/week      PT Plan Current plan remains appropriate    Co-evaluation              AM-PAC PT "6 Clicks"  Daily Activity  Outcome Measure  Difficulty turning over in bed (including adjusting bedclothes, sheets and blankets)?: None Difficulty moving from lying on back to sitting on the side of the bed? : None Difficulty sitting down on and standing up from a chair with arms (e.g., wheelchair, bedside commode, etc,.)?: None Help needed moving to and from a bed to chair (including a wheelchair)?: None Help needed walking in hospital room?: A Little Help needed climbing 3-5 steps with a railing? : A Little 6 Click Score: 22    End of Session Equipment Utilized During Treatment: Gait belt Activity Tolerance: Patient tolerated treatment well Patient left: in chair;with call bell/phone within reach Nurse Communication: Mobility status PT Visit Diagnosis: Muscle weakness (generalized) (M62.81);Other (comment)     Time:  4008-6761 PT Time Calculation (min) (ACUTE ONLY): 32 min  Charges:  $Gait Training: 8-22 mins $Therapeutic Activity: 8-22 mins                    G Codes:       Earney Navy, PTA Pager: [phone]     Darliss Cheney 05/24/2017, 10:28 AM

## 2017-05-24 NOTE — Progress Notes (Signed)
CM talked to patient and her daughter [name]; patient discharging home today; DME ordered and is in the room; Aneta Mins [phone]

## 2017-05-24 NOTE — Progress Notes (Signed)
Progress Note  Patient Name: Margaret Dyer Date of Encounter: 05/24/2017  Primary Cardiologist: Reola Calkins ([place] long ago)  Subjective   The patient feels better today. SpO2 still in low 90'.  Inpatient Medications    Scheduled Meds: . ALPRAZolam  0.25 mg Oral TID  . amLODipine  10 mg Oral Daily  . atorvastatin  20 mg Oral q1800  . enoxaparin (LOVENOX) injection  40 mg Subcutaneous Q24H  . feeding supplement (PRO-STAT SUGAR FREE 64)  30 mL Oral BID  . gabapentin  100 mg Oral BID  . hydrALAZINE  50 mg Oral Q8H  . insulin aspart  0-5 Units Subcutaneous QHS  . insulin aspart  0-9 Units Subcutaneous TID WC  . insulin aspart  15 Units Subcutaneous TID WC  . insulin glargine  50 Units Subcutaneous QHS  . levofloxacin  750 mg Oral Q48H  . levothyroxine  25 mcg Oral QAC breakfast  . loratadine  10 mg Oral Daily  . metoprolol tartrate  25 mg Oral BID  . oxybutynin  5 mg Oral BH-q7a  . pantoprazole  40 mg Oral Daily  . sodium chloride flush  3 mL Intravenous Q12H  . sodium chloride flush  3 mL Intravenous Q12H   Continuous Infusions: . sodium chloride     PRN Meds: sodium chloride, acetaminophen **OR** acetaminophen, albuterol, antiseptic oral rinse, calcium carbonate, lip balm, ondansetron **OR** ondansetron (ZOFRAN) IV, sodium chloride flush, traMADol   Vital Signs    Vitals:   05/23/17 1126 05/23/17 2200 05/23/17 2203 05/24/17 0612  BP: (!) 134/52 (!) 134/53 (!) 134/53 140/61  Pulse: 78  85 78  Resp: _0 Temp: 98.7 F (37.1 C)  98.4 F (36.9 C) 98.6 F (37 C)  TempSrc: Oral  Oral Oral  SpO2: 91%  92% 93%  Weight:    209 lb 11.2 oz (95.1 kg)  Height:        Intake/Output Summary (Last 24 hours) at 05/24/17 0902 Last data filed at 05/24/17 0612  Gross per 24 hour  Intake              960 ml  Output             1250 ml  Net             -290 ml   Filed Weights   05/22/17 0612 05/23/17 0455 05/24/17 0612  Weight: 206 lb 4.8 oz (93.6 kg) 207 lb 4.8 oz (94  kg) 209 lb 11.2 oz (95.1 kg)    Telemetry    Sinus rhythm with rates in the 70s and brief, few seconds, ectopic atrial tachycardia - Personally Reviewed  ECG    No new tracings  Physical Exam   GEN: Obese female . No acute distress.   Neck: No JVD Cardiac: RRR, no murmurs, rubs, or gallops.  Respiratory:  crackles B/L GI: Soft, obese, non-distended, mild tenderness to palpation in the right upper quadrant MS:  1-2+ pitting lower extremity edema; No deformity. Neuro:  Nonfocal  Psych: Normal affect   Labs    Chemistry  Recent Labs Lab 05/21/17 0527 05/22/17 0303 05/23/17 0234 05/24/17 0745  NA 139 141 136 135  K 3.5 4.1 3.5 3.2*  CL 96* 93* 93* 92*  CO2 35* 37* 31 34*  GLUCOSE 58* 92 176* 74  BUN 44* 43* 39* 33*  CREATININE 1.07* 1.24* 1.43* 1.27*  CALCIUM 8.4* 8.7* 8.5* 8.6*  PROT 5.4* 6.1* 5.7*  --  ALBUMIN 2.6* 2.8* 2.7*  --   AST 25 32 27  --   ALT 34 37 34  --   ALKPHOS 63 69 65  --   BILITOT 0.7 0.8 0.8  --   GFRNONAA 48* 40* 34* 39*  GFRAA 55* 46* 39* 45*  ANIONGAP _0 Hematology  Recent Labs Lab 05/21/17 0527 05/22/17 0303 05/23/17 0234  WBC 9.2 8.8 10.4  RBC 4.56 4.84 4.45  HGB 13.5 14.1 13.2  HCT 41.6 44.7 40.8  MCV 91.2 92.4 91.7  MCH 29.6 29.1 29.7  MCHC 32.5 31.5 32.4  RDW 13.6 13.8 13.7  PLT 129* 136* 125*    Cardiac EnzymesNo results for input(s): TROPONINI in the last 168 hours. No results for input(s): TROPIPOC in the last 168 hours.   BNPNo results for input(s): BNP, PROBNP in the last 168 hours.   DDimer No results for input(s): DDIMER in the last 168 hours.   Radiology    No results found.  Cardiac Studies   Right Heart Cath and Coronary Angiography 05/18/17  Conclusion     Hemodynamic findings consistent with moderate-severe pulmonary hypertension.   Severe diastolic heart failure with PCWP of 32 mmHg. Noted large V wave on wedge waveform. Appears to be mostly pulmonary venous congestion, however  there is a transpulmonary gradient of 12 mmHg suggesting there is a component of pulmonary disease as well. The presence of a very large aching V wave in PCWP waveforms would suggest mitral regurgitation.   ______________________________________________________________________________  Echocardiogram 05/13/17 ------------------------------------------------------------------- Study Conclusions  - Left ventricle: The cavity size was normal. Wall thickness was   normal. Systolic function was normal. The estimated ejection   fraction was in the range of 55% to 60%. Features are consistent   with a pseudonormal left ventricular filling pattern, with   concomitant abnormal relaxation and increased filling pressure   (grade 2 diastolic dysfunction). - Left atrium: The atrium was mildly dilated.    Patient Profile     82 y.o. female two-week duration as significant Dyspnea on exertion in the setting of chronic lower extremity edema  Assessment & Plan    Acute on chronic diastolic CHF -Echocardiogram on 05/13/17 showed EF 55-60% and pseudo-normal left ventricular filling pattern with concomitant abnormal relaxation and increased filling pressure, grade 2 diastolic dysfunction -Chest x-ray on 05/12/17 showed interstitial prominence noted of the lungs bilaterally which may reflect chronic interstitial lung disease, mild interstitial edema, aortic atherosclerosis -Right heart cath on 05/18/17 showed moderate to severe pulmonary hypertension, severe diastolic heart failure with elevated PCWP of 32 mmHg and large V waves (but no significant MR). Transpulmonary gradient of 12 mmHg suggesting a component of pulmonary disease. CT high-resolution on 05/19/17 was negative for interstitial lung disease.  -Patient was not taking Lasix at home due to frequent urination. Will need to take diuretic at home. Discussed with patient and she understands. -Crea inceasing today 1.0-1.2-.1.4, improved today to 1.27,  -  we will discharge with Lasix 40 mg po BID and KCl 20 mEq daily, Weight at 209, baseline 204 lbs - we will arrange for an outpatient follow up in 1 week and adjust Lasix as needed - ATB started to work, she is feeling better  Acute kidney injury -History of nephrectomy secondary to renal cell carcinoma in 2000 -as above  Hypertension - controlled  Diabetes type 2 -Management per internal medicine  Hyperlipidemia -History of cirrhosis but has normal LFTs now -Lipid panel on  05/14/2017: Total cholesterol 218, LDL 143. Statin initiated  Ena Dawley, MD 05/24/2017

## 2017-05-24 NOTE — Progress Notes (Signed)
Discharge instructions reviewed with patient and daughter, reviewed all medications including when the next doses were due.  Prescriptions given to patient.  Also reviewed living with heart failure booklet information regarding low sodium diet and printed daughter off information on a diabetic diet and carb counting.  Patient transported to front of hospital via wheelchair to be taken home by daughter.

## 2017-05-25 ENCOUNTER — Telehealth: Payer: Self-pay | Admitting: Behavioral Health

## 2017-05-25 NOTE — Telephone Encounter (Signed)
Attempted to reach patient for TCM/Hospital Follow-up call. Left message for patient to return call when available.

## 2017-05-28 ENCOUNTER — Inpatient Hospital Stay: Payer: Medicare Other | Admitting: Family Medicine

## 2017-05-28 ENCOUNTER — Encounter: Payer: Self-pay | Admitting: Family Medicine

## 2017-05-28 ENCOUNTER — Ambulatory Visit (INDEPENDENT_AMBULATORY_CARE_PROVIDER_SITE_OTHER): Payer: Medicare Other | Admitting: Family Medicine

## 2017-05-28 VITALS — BP 120/60 | HR 65 | Temp 97.6°F | Resp 16 | Ht 63.0 in | Wt 206.8 lb

## 2017-05-28 DIAGNOSIS — B356 Tinea cruris: Secondary | ICD-10-CM | POA: Diagnosis not present

## 2017-05-28 DIAGNOSIS — I10 Essential (primary) hypertension: Secondary | ICD-10-CM | POA: Diagnosis not present

## 2017-05-28 DIAGNOSIS — E785 Hyperlipidemia, unspecified: Secondary | ICD-10-CM | POA: Diagnosis not present

## 2017-05-28 DIAGNOSIS — E113293 Type 2 diabetes mellitus with mild nonproliferative diabetic retinopathy without macular edema, bilateral: Secondary | ICD-10-CM | POA: Diagnosis not present

## 2017-05-28 DIAGNOSIS — I159 Secondary hypertension, unspecified: Secondary | ICD-10-CM | POA: Diagnosis not present

## 2017-05-28 DIAGNOSIS — E039 Hypothyroidism, unspecified: Secondary | ICD-10-CM | POA: Diagnosis not present

## 2017-05-28 DIAGNOSIS — E119 Type 2 diabetes mellitus without complications: Secondary | ICD-10-CM

## 2017-05-28 DIAGNOSIS — Z23 Encounter for immunization: Secondary | ICD-10-CM

## 2017-05-28 DIAGNOSIS — Z794 Long term (current) use of insulin: Secondary | ICD-10-CM | POA: Diagnosis not present

## 2017-05-28 DIAGNOSIS — J189 Pneumonia, unspecified organism: Secondary | ICD-10-CM

## 2017-05-28 DIAGNOSIS — E1169 Type 2 diabetes mellitus with other specified complication: Secondary | ICD-10-CM

## 2017-05-28 DIAGNOSIS — B370 Candidal stomatitis: Secondary | ICD-10-CM

## 2017-05-28 DIAGNOSIS — E559 Vitamin D deficiency, unspecified: Secondary | ICD-10-CM | POA: Diagnosis not present

## 2017-05-28 MED ORDER — NYSTATIN 100000 UNIT/GM EX POWD: Freq: Four times a day (QID) | CUTANEOUS | 0 refills | Status: DC

## 2017-05-28 MED ORDER — NYSTATIN 100000 UNIT/ML MT SUSP: 5.0000 mL | Freq: Four times a day (QID) | OROMUCOSAL | 0 refills | Status: DC

## 2017-05-28 NOTE — Patient Instructions (Signed)
General Assault Assault includes any behavior or physical attack-whether it is on purpose or not-that results in injury to another person, damage to property, or both. This also includes assault that has not yet happened, but is planned to happen. Threats of assault may be physical, verbal, or written. They may be said or sent by:  Mail.  E-mail.  Text.  Social media.  Fax.  The threats may be direct, implied, or understood. What are the different forms of assault? Forms of assault include:  Physically assaulting a person. This includes physical threats to inflict physical harm as well as: ? Slapping. ? Hitting. ? Poking. ? Kicking. ? Punching. ? Pushing.  Sexually assaulting a person. Sexual assault is any sexual activity that a person is forced, threatened, or coerced to participate in. It may or may not involve physical contact with the person who is assaulting you. You are sexually assaulted if you are forced to have sexual contact of any kind.  Damaging or destroying a person's assistive equipment, such as glasses, canes, or walkers.  Throwing or hitting objects.  Using or displaying a weapon to harm or threaten someone.  Using or displaying an object that appears to be a weapon in a threatening manner.  Using greater physical size or strength to intimidate someone.  Making intimidating or threatening gestures.  Bullying.  Hazing.  Using language that is intimidating, threatening, hostile, or abusive.  Stalking.  Restraining someone with force.  What should I do if I experience assault?   Report assaults, threats, and stalking to the police. Call your local emergency services (911 in the [place] if you are in immediate danger or you need medical help.  You can work with a Chief Executive Officer or an advocate to get legal protection against someone who has assaulted you or threatened you with assault. Protection includes restraining orders and private addresses. Crimes  against you, such as assault, can also be prosecuted through the courts. Laws will vary depending on where you live. This information is not intended to replace advice given to you by your health care provider. Make sure you discuss any questions you have with your health care provider. Document Released: 11/20/2005 Document Revised: 04/27/2016 Document Reviewed: 08/07/2014 Elsevier Interactive Patient Education  2018 [employer].

## 2017-05-28 NOTE — Telephone Encounter (Signed)
Left message x 2 for TCM/Hospital Follow-up call. Patient has an appointment scheduled today, 05/28/17 at 2:30 PM with PCP.
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
## 2017-05-29 DIAGNOSIS — B370 Candidal stomatitis: Secondary | ICD-10-CM | POA: Insufficient documentation

## 2017-05-29 DIAGNOSIS — E559 Vitamin D deficiency, unspecified: Secondary | ICD-10-CM | POA: Insufficient documentation

## 2017-05-29 LAB — COMPREHENSIVE METABOLIC PANEL
ALT: 38 U/L — ABNORMAL HIGH (ref 0–35)
AST: 36 U/L (ref 0–37)
Albumin: 3.6 g/dL (ref 3.5–5.2)
Alkaline Phosphatase: 92 U/L (ref 39–117)
BILIRUBIN TOTAL: 0.5 mg/dL (ref 0.2–1.2)
BUN: 40 mg/dL — ABNORMAL HIGH (ref 6–23)
CHLORIDE: 99 meq/L (ref 96–112)
CO2: 29 mEq/L (ref 19–32)
CREATININE: 1.41 mg/dL — AB (ref 0.40–1.20)
Calcium: 9.3 mg/dL (ref 8.4–10.5)
GFR: 38.14 mL/min — ABNORMAL LOW (ref >60.00)
Glucose, Bld: 199 mg/dL — ABNORMAL HIGH (ref 70–99)
Potassium: 4.1 mEq/L (ref 3.5–5.1)
Sodium: 138 mEq/L (ref 135–145)
Total Protein: 6.5 g/dL (ref 6.0–8.3)

## 2017-05-29 LAB — LIPID PANEL
Cholesterol: 160 mg/dL (ref 0–200)
HDL: 37.3 mg/dL — ABNORMAL LOW (ref >39.00)
LDL CALC: 94 mg/dL (ref 0–99)
NonHDL: 122.58
Total CHOL/HDL Ratio: 4
Triglycerides: 145 mg/dL (ref 0.0–149.0)
VLDL: 29 mg/dL (ref 0.0–40.0)

## 2017-05-29 LAB — MICROALBUMIN / CREATININE URINE RATIO
CREATININE, U: 79.3 mg/dL
Microalb Creat Ratio: 66.2 mg/g — ABNORMAL HIGH (ref 0.0–30.0)
Microalb, Ur: 52.5 mg/dL — ABNORMAL HIGH (ref 0.0–1.9)

## 2017-05-29 NOTE — Assessment & Plan Note (Signed)
Tolerating statin, encouraged heart healthy diet, avoid trans fats, minimize simple carbs and saturated fats. Increase exercise as tolerated 

## 2017-05-29 NOTE — Assessment & Plan Note (Signed)
resolved 

## 2017-05-29 NOTE — Assessment & Plan Note (Signed)
minimize simple carbs. Increase exercise as tolerated. Continue current meds

## 2017-05-29 NOTE — Assessment & Plan Note (Signed)
Check labs con't meds

## 2017-05-29 NOTE — Assessment & Plan Note (Signed)
Well controlled, no changes to meds. Encouraged heart healthy diet such as the DASH diet and exercise as tolerated.

## 2017-05-30 LAB — VITAMIN D 1,25 DIHYDROXY
VITAMIN D3 1, 25 (OH): 23 pg/mL
Vitamin D 1, 25 (OH)2 Total: 23 pg/mL (ref 18–72)

## 2017-05-31 ENCOUNTER — Other Ambulatory Visit: Payer: Self-pay | Admitting: Family Medicine

## 2017-06-01 ENCOUNTER — Telehealth: Payer: Self-pay | Admitting: Family Medicine

## 2017-06-01 NOTE — Telephone Encounter (Signed)
Caller name:Rayola Moncus Relationship to patient: Can be reached:[phone] Pharmacy:  Reason for call:Requesting lab work results, also wants a copy mailed to her, Address: [address] [place] [phone]. [facility] was never called in, [facility] on [address]

## 2017-06-04 ENCOUNTER — Encounter: Payer: Self-pay | Admitting: *Deleted

## 2017-06-04 ENCOUNTER — Other Ambulatory Visit: Payer: Self-pay | Admitting: *Deleted

## 2017-06-04 DIAGNOSIS — R809 Proteinuria, unspecified: Secondary | ICD-10-CM

## 2017-06-04 NOTE — Telephone Encounter (Signed)
Left message on machine to call back   See labs.

## 2017-06-04 NOTE — Telephone Encounter (Signed)
Pt is returning your call regarding lab results please call pt back at [phone]

## 2017-06-04 NOTE — Telephone Encounter (Signed)
Patient notified

## 2017-06-05 ENCOUNTER — Encounter: Payer: Self-pay | Admitting: Physician Assistant

## 2017-06-05 ENCOUNTER — Ambulatory Visit (INDEPENDENT_AMBULATORY_CARE_PROVIDER_SITE_OTHER): Payer: Medicare Other | Admitting: Physician Assistant

## 2017-06-05 ENCOUNTER — Encounter (INDEPENDENT_AMBULATORY_CARE_PROVIDER_SITE_OTHER): Payer: Self-pay

## 2017-06-05 VITALS — BP 118/62 | HR 75 | Ht 63.5 in | Wt 203.8 lb

## 2017-06-05 DIAGNOSIS — I10 Essential (primary) hypertension: Secondary | ICD-10-CM

## 2017-06-05 DIAGNOSIS — I2510 Atherosclerotic heart disease of native coronary artery without angina pectoris: Secondary | ICD-10-CM

## 2017-06-05 DIAGNOSIS — I5032 Chronic diastolic (congestive) heart failure: Secondary | ICD-10-CM | POA: Diagnosis not present

## 2017-06-05 DIAGNOSIS — R0602 Shortness of breath: Secondary | ICD-10-CM | POA: Diagnosis not present

## 2017-06-05 DIAGNOSIS — N183 Chronic kidney disease, stage 3 unspecified: Secondary | ICD-10-CM

## 2017-06-05 DIAGNOSIS — I272 Pulmonary hypertension, unspecified: Secondary | ICD-10-CM

## 2017-06-05 DIAGNOSIS — E785 Hyperlipidemia, unspecified: Secondary | ICD-10-CM

## 2017-06-05 MED ORDER — ASPIRIN EC 81 MG PO TBEC: 81.0000 mg | DELAYED_RELEASE_TABLET | Freq: Every day | ORAL | 3 refills | Status: DC

## 2017-06-05 NOTE — Patient Instructions (Addendum)
Medication Instructions:  Your physician recommends that you continue on your current medications as directed. Please refer to the Current Medication list given to you today.  Check the dose of the Vytorin and if it is greater than 10/20 mg, please discuss with your Primary Care Physician  Labwork: None ordered  Testing/Procedures: Your physician has requested that you have a lexiscan myoview. For further information please visit [email or web address]. Please follow instruction sheet, as given.   Follow-Up: Your physician recommends that you schedule a follow-up appointment in: [place] DR. HILTY   Any Other Special Instructions Will Be Listed Below (If Applicable).   Regadenoson injection What is this medicine? REGADENOSON is used to test the heart for coronary artery disease. It is used in patients who can not exercise for their stress test. This medicine may be used for other purposes; ask your health care provider or pharmacist if you have questions. COMMON BRAND NAME(S): Lexiscan What should I tell my health care provider before I take this medicine? They need to know if you have any of these conditions: -heart problems -lung or breathing disease, like asthma or COPD -an unusual or allergic reaction to regadenoson, other medicines, foods, dyes, or preservatives -pregnant or trying to get pregnant -breast-feeding How should I use this medicine? This medicine is for injection into a vein. It is given by a health care professional in a hospital or clinic setting. Talk to your pediatrician regarding the use of this medicine in children. Special care may be needed. Overdosage: If you think you have taken too much of this medicine contact a poison control center or emergency room at once. NOTE: This medicine is only for you. Do not share this medicine with others. What if I miss a dose? This does not apply. What may interact with this  medicine? -caffeine -dipyridamole -guarana -theophylline This list may not describe all possible interactions. Give your health care provider a list of all the medicines, herbs, non-prescription drugs, or dietary supplements you use. Also tell them if you smoke, drink alcohol, or use illegal drugs. Some items may interact with your medicine. What should I watch for while using this medicine? Your condition will be monitored carefully while you are receiving this medicine. Do not take medicines, foods, or drinks with caffeine (like coffee, tea, or colas) for at least 12 hours before your test. If you do not know if something contains caffeine, ask your health care professional. What side effects may I notice from receiving this medicine? Side effects that you should report to your doctor or health care professional as soon as possible: -allergic reactions like skin rash, itching or hives, swelling of the face, lips, or tongue -breathing problems -chest pain, tightness or palpitations -severe headache Side effects that usually do not require medical attention (report to your doctor or health care professional if they continue or are bothersome): -flushing -headache -irritation or pain at site where injected -nausea, vomiting This list may not describe all possible side effects. Call your doctor for medical advice about side effects. You may report side effects to FDA at 1-800-FDA-1088. Where should I keep my medicine? This drug is given in a hospital or clinic and will not be stored at home. NOTE: This sheet is a summary. It may not cover all possible information. If you have questions about this medicine, talk to your doctor, pharmacist, or health care provider.  2018 Elsevier/Gold Standard (2008-07-20 15:08:13)   If you need a refill on your cardiac medications  before your next appointment, please call your pharmacy.       For patients with congestive heart failure, we give them  these special instructions:  1. Follow a low-salt diet - you are allowed no more than 2,029m of sodium per day. Watch your fluid intake. In general, you should not be taking in more than 2 liters of fluid per day (no more than 8 glasses per day). This includes sources of water in foods like soup, coffee, tea, milk, etc. 2. Weigh yourself on the same scale at same time of day and keep a log. 3. Call your doctor: (Anytime you feel any of the following symptoms)  - 3lb weight gain overnight or 5lb within a few days - Shortness of breath, with or without a dry hacking cough  - Swelling in the hands, feet or stomach  - If you have to sleep on extra pillows at night in order to breathe   IT IS IMPORTANT TO LET YOUR DOCTOR KNOW EARLY ON IF YOU ARE HAVING SYMPTOMS SO WE CAN HELP YOU!
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
## 2017-06-07 ENCOUNTER — Telehealth: Payer: Self-pay | Admitting: Physician Assistant

## 2017-06-07 NOTE — Telephone Encounter (Signed)
New message   Pt daughter calling and states her PCP suggested Zoloft for her depression and anxiety. Pt daughter wants to know if this would be OK heart wise? Requests a call back.

## 2017-06-08 ENCOUNTER — Inpatient Hospital Stay: Payer: Medicare Other | Admitting: Internal Medicine

## 2017-06-08 ENCOUNTER — Telehealth: Payer: Self-pay | Admitting: Family Medicine

## 2017-06-08 DIAGNOSIS — I10 Essential (primary) hypertension: Secondary | ICD-10-CM | POA: Diagnosis not present

## 2017-06-08 DIAGNOSIS — E039 Hypothyroidism, unspecified: Secondary | ICD-10-CM | POA: Diagnosis not present

## 2017-06-08 DIAGNOSIS — E785 Hyperlipidemia, unspecified: Secondary | ICD-10-CM | POA: Diagnosis not present

## 2017-06-08 DIAGNOSIS — Z794 Long term (current) use of insulin: Secondary | ICD-10-CM | POA: Diagnosis not present

## 2017-06-08 DIAGNOSIS — E1165 Type 2 diabetes mellitus with hyperglycemia: Secondary | ICD-10-CM | POA: Diagnosis not present

## 2017-06-08 NOTE — Telephone Encounter (Signed)
I don't see a problem with that. Shirlean Berman PA-C

## 2017-06-08 NOTE — Telephone Encounter (Signed)
Returned pts call and advised her that it was ok for her to take Zoloft along with her cardiac meds.  Pt verbalized understanding.

## 2017-06-08 NOTE — Telephone Encounter (Signed)
Pt dropped off document to be filled out (Document in an envelope-[employer] patient Assistance Program Enrollment) Pt would like document to be mailed to the address on document - envelope included. Document put at front office tray.

## 2017-06-11 ENCOUNTER — Telehealth: Payer: Self-pay | Admitting: Physician Assistant

## 2017-06-11 ENCOUNTER — Telehealth: Payer: Self-pay | Admitting: Family Medicine

## 2017-06-11 ENCOUNTER — Other Ambulatory Visit: Payer: Self-pay | Admitting: Family Medicine

## 2017-06-11 MED ORDER — FUROSEMIDE 40 MG PO TABS: 40.0000 mg | ORAL_TABLET | Freq: Two times a day (BID) | ORAL | 3 refills | Status: DC

## 2017-06-11 NOTE — Telephone Encounter (Signed)
Returned daughter, [name], call, DPR on file. She was concerned about pt having heart cath with pts kidney function. She was advised that we would recheck her kidney function before we proceed with the heart cath, but pt needed to have stress test first. She was thankful for the call and verbalized understanding.

## 2017-06-11 NOTE — Telephone Encounter (Signed)
New Message   pt daughter verbalized that she is returning call for rn

## 2017-06-11 NOTE — Telephone Encounter (Signed)
Completed PCP information and forwarded to provider/SLS 07/09

## 2017-06-11 NOTE — Telephone Encounter (Signed)
Pharmacy requesting refill for sertraline 50 mg 1 by mouth daily #30 [facility] [address] [place]. Medication not on current list.

## 2017-06-11 NOTE — Telephone Encounter (Signed)
Was unable to speak to the daughter. The patient answered and she does not want this to be filled. Will discuss with PCP at her next OV

## 2017-06-11 NOTE — Telephone Encounter (Signed)
Please ask pt daughter about this since it is not on our list anymore

## 2017-06-11 NOTE — Telephone Encounter (Signed)
New message    Patient daughter calling - general question regarding [facility] before Nuclear stress appt on  7/19.

## 2017-06-12 ENCOUNTER — Other Ambulatory Visit: Payer: Self-pay | Admitting: Family Medicine

## 2017-06-12 NOTE — Telephone Encounter (Signed)
Requesting:   tramadol Contract    05/28/2017 UDS   Low risk was due on 01/31/17 Last OV    05/28/17 Last Refill    #120 on 04/16/2017  Please Advise

## 2017-06-12 NOTE — Telephone Encounter (Signed)
Faxed hardcopy to [facility] [facility] in [place]

## 2017-06-19 ENCOUNTER — Telehealth (HOSPITAL_COMMUNITY): Payer: Self-pay | Admitting: *Deleted

## 2017-06-19 NOTE — Telephone Encounter (Signed)
Left message on voicemail per DPR in reference to upcoming appointment scheduled on 06/21/17 with detailed instructions given per Myocardial Perfusion Study Information Sheet for the test. LM to arrive 15 minutes early, and that it is imperative to arrive on time for appointment to keep from having the test rescheduled. If you need to cancel or reschedule your appointment, please call the office within 24 hours of your appointment. Failure to do so may result in a cancellation of your appointment, and a $50 no show fee. Phone number given for call back for any questions. Kirstie Peri

## 2017-06-21 ENCOUNTER — Ambulatory Visit (HOSPITAL_COMMUNITY): Payer: Medicare Other | Attending: Cardiology

## 2017-06-21 DIAGNOSIS — R079 Chest pain, unspecified: Secondary | ICD-10-CM | POA: Diagnosis not present

## 2017-06-21 DIAGNOSIS — E119 Type 2 diabetes mellitus without complications: Secondary | ICD-10-CM | POA: Diagnosis not present

## 2017-06-21 DIAGNOSIS — R0609 Other forms of dyspnea: Secondary | ICD-10-CM | POA: Insufficient documentation

## 2017-06-21 DIAGNOSIS — I2510 Atherosclerotic heart disease of native coronary artery without angina pectoris: Secondary | ICD-10-CM | POA: Diagnosis not present

## 2017-06-21 DIAGNOSIS — I10 Essential (primary) hypertension: Secondary | ICD-10-CM | POA: Diagnosis not present

## 2017-06-21 LAB — MYOCARDIAL PERFUSION IMAGING
CHL CUP NUCLEAR SDS: 2
LHR: 0.33
LV dias vol: 101 mL (ref 46–106)
LVSYSVOL: 30 mL
Peak HR: 77 {beats}/min
Rest HR: 66 {beats}/min
SRS: 0
SSS: 2
TID: 1.11

## 2017-06-21 MED ORDER — TECHNETIUM TC 99M TETROFOSMIN IV KIT
10.1000 | PACK | Freq: Once | INTRAVENOUS | Status: AC | PRN
  Administered 2017-06-21: 10.1 via INTRAVENOUS
  Filled 2017-06-21: qty 11

## 2017-06-21 MED ORDER — TECHNETIUM TC 99M TETROFOSMIN IV KIT
32.8000 | PACK | Freq: Once | INTRAVENOUS | Status: AC | PRN
  Administered 2017-06-21: 32.8 via INTRAVENOUS
  Filled 2017-06-21: qty 33

## 2017-06-21 MED ORDER — REGADENOSON 0.4 MG/5ML IV SOLN
0.4000 mg | Freq: Once | INTRAVENOUS | Status: AC
  Administered 2017-06-21: 0.4 mg via INTRAVENOUS

## 2017-06-26 NOTE — Telephone Encounter (Signed)
Pt called in to follow up on document. She said that she is out of her cholesterol medication. Pt would like to have form mailed to address on file as soon as possible

## 2017-06-28 ENCOUNTER — Other Ambulatory Visit: Payer: Self-pay | Admitting: Family Medicine

## 2017-06-28 NOTE — Telephone Encounter (Signed)
database

## 2017-06-28 NOTE — Telephone Encounter (Signed)
Requesting:   lorazepam Contract    01/12/2015 UDS    Moderate last done on 12/24/2015 Last OV    05/28/2017 Last Refill   #60 on 05/01/2017  Please Advise

## 2017-06-29 ENCOUNTER — Other Ambulatory Visit: Payer: Self-pay | Admitting: Family Medicine

## 2017-06-29 MED ORDER — POTASSIUM CHLORIDE ER 20 MEQ PO TBCR: 20.0000 meq | EXTENDED_RELEASE_TABLET | Freq: Every day | ORAL | 0 refills | Status: DC

## 2017-06-29 NOTE — Telephone Encounter (Signed)
Patient states she's completely out and would like LORazepam (ATIVAN) 0.5 MG tablet, please send to    [place], [place] [phone] (Phone) [phone] (Fax)

## 2017-06-29 NOTE — Telephone Encounter (Signed)
Relation to SR:PRXY Call back [place]: [place], [place] [phone] (Phone) [phone] (Fax)     Reason for call:  Patient requesting potassium chloride 20 MEQ TBCR

## 2017-06-29 NOTE — Telephone Encounter (Signed)
Potassium refilled

## 2017-06-29 NOTE — Telephone Encounter (Signed)
Enrollment from was mailed to [employer] on 06/12/17; Attempt to reach [employer] via phone, they are closed and will open at 8:00am, will call back to inquire as to where this order is/SLS

## 2017-06-29 NOTE — Telephone Encounter (Signed)
Date base printed 05/28/17 and is in media

## 2017-06-29 NOTE — Telephone Encounter (Signed)
Phoned in refill for lorazepam 0.5 mg #60 no refills Spoke to pharmacist and gave refill Patient notified refill done

## 2017-06-29 NOTE — Telephone Encounter (Signed)
Pt called in to follow up on refill request. She said that she would like to pick up today if possible.

## 2017-07-03 NOTE — Telephone Encounter (Signed)
Pt called back to follow up on form process. Updated pt with the note below. She said that she will call back in to follow back up in a few days.    Ivin Booty, can you try calling [employer] again per your previous message for an update for pt?   CB pt : [phone] if needed.

## 2017-07-04 ENCOUNTER — Other Ambulatory Visit: Payer: Self-pay | Admitting: Internal Medicine

## 2017-07-04 MED ORDER — HYDRALAZINE HCL 50 MG PO TABS: 50.0000 mg | ORAL_TABLET | Freq: Three times a day (TID) | ORAL | 2 refills | Status: DC

## 2017-07-04 NOTE — Telephone Encounter (Signed)
*  STAT* If patient is at the pharmacy, call can be transferred to refill team.   1. Which medications need to be refilled? (please list name of each medication and dose if known) Hydralazine 50 mg   2. Which pharmacy/location (including street and city if local pharmacy) is medication to be sent to? [facility] [address] [phone]  3. Do they need a 30 day or 90 day supply? 30 day

## 2017-07-04 NOTE — Addendum Note (Signed)
Addended by: Fidel Levy on: 07/04/2017 11:25 AM   Modules accepted: Orders

## 2017-07-04 NOTE — Telephone Encounter (Signed)
Rx(s) sent to pharmacy electronically. Patient scheduled to see MD in October

## 2017-07-04 NOTE — Telephone Encounter (Signed)
Called [employer] to inquire on Vytorin Patient Assistance medication that application was mailed on 06/12/17, but patient has yet to receive medication. Spoke with Jana Half first; who informed me that application for Vytorin 10-20mg was Approved on 06/21/17, the Rx number is [id number], and the Order was done on 06/25/17, but could not tell me as to why Order has not been mailed to patient. Jana Half then transferred me to the Pharmacy [phone]] and I spoke with [facility], who informed me after review of information, that patient should receive medication by end of week [still no reason as to why Rx had not been mailed after order on 06/25/17 to patient]. Called and spoke with patient to inform her of all information regarding medication that had been given to me by [employer]. Informed to give a call back if she does not receive in Friday mail and we will get back on the phone with their pharmacy. Patient's address is different on patient assistance, as she is currently at daughter's address in [place] now [see demographics]. Patient also asked about a new medication that was px for her while in the hospital [Hydralazine 50 mg, she reports that she is taking BID]. She request new Rx to her [facility] pharmacy; explained that her PCP is out of the office on vacation through the week of 07/09/17 and with this never before being px by her primary care provider, who recommended that she f/u with Cardiology, with whom she has also seen since hospital, that it would be best for her to call their office [gave her contact number] and make the request first, but if for some reason they do not wish to renew Rx, then to call me back and I will speak with a covering provider. Patient understood & agreed/SLS 08/01

## 2017-07-09 ENCOUNTER — Inpatient Hospital Stay: Payer: Medicare Other | Admitting: Internal Medicine

## 2017-07-13 ENCOUNTER — Telehealth: Payer: Self-pay | Admitting: Medical

## 2017-07-13 ENCOUNTER — Other Ambulatory Visit: Payer: Self-pay | Admitting: Family Medicine

## 2017-07-13 DIAGNOSIS — E039 Hypothyroidism, unspecified: Secondary | ICD-10-CM

## 2017-07-13 MED ORDER — TRAMADOL HCL 50 MG PO TABS: ORAL_TABLET | ORAL | 0 refills | Status: DC

## 2017-07-13 NOTE — Telephone Encounter (Signed)
Can you fax or call the printed rx of tramadol for pt.

## 2017-07-13 NOTE — Telephone Encounter (Signed)
Requesting:   tramadol Contract    05/28/2017 UDS   Low risk was due on 01/31/2017 Last OV    05/28/2017 Last Refill   #120 no refills on 06/12/2017  Please Advise

## 2017-07-16 NOTE — Telephone Encounter (Signed)
Rx faxed to the pharmacy.  Confirmation received.//AB/CMA

## 2017-07-19 DIAGNOSIS — N390 Urinary tract infection, site not specified: Secondary | ICD-10-CM | POA: Diagnosis not present

## 2017-07-25 ENCOUNTER — Telehealth: Payer: Self-pay | Admitting: Internal Medicine

## 2017-07-25 DIAGNOSIS — Z79899 Other long term (current) drug therapy: Secondary | ICD-10-CM

## 2017-07-25 DIAGNOSIS — I10 Essential (primary) hypertension: Secondary | ICD-10-CM

## 2017-07-25 NOTE — Telephone Encounter (Signed)
Called patient at [phone] since she was not at number given  Patient currently takes her Furosemide 40 mg around 10 am and 10 pm. Advised patient to try taking the Furosemide around 10 am when she gets up and early afternoon to see if it will help with her getting up so often during night.  She was concerned about potassium being low since she is urinating so often and whether she needs this dose of Furosemide. She does not weigh daily only about once a week, no change. Watches her salt intake but does have increased swelling if she consumes. Takes K+ 20 meq daily, no labs since June 2018. Patient scheduled for follow up 09/12/17, states she feels about the same as when she saw Mickle Asper PA in June. Will forward to Dr Debara Pickett for review

## 2017-07-25 NOTE — Telephone Encounter (Signed)
Pt c/o medication issue:  1. Name of Medication: Lasix  2. How are you currently taking this medication (dosage and times per day)? 40 mg twice a day  3. Are you having a reaction (difficulty breathing--STAT)? no  4. What is your medication issue? Patient takes Lasix in the morning and in the evening and she states that the medicine keeps her awake a night and would like to change up how she takes medication.

## 2017-07-26 NOTE — Telephone Encounter (Signed)
Agree with those recommendations. Ok to order BMET to check potassium since she is concerned.  Dr. Lemmie Evens

## 2017-07-26 NOTE — Telephone Encounter (Signed)
Advised patient of recommendations.  

## 2017-07-31 ENCOUNTER — Other Ambulatory Visit: Payer: Self-pay | Admitting: Family Medicine

## 2017-07-31 NOTE — Telephone Encounter (Signed)
Pt is requesting refill on lorazepam 0.73m.   Last OV: 05/28/2017 Last Fill: 06/29/2017 #60 and 0RF UDS: 09/23/2015 Moderate risk  [place] database 05/28/2017- in media  Please advise.

## 2017-07-31 NOTE — Telephone Encounter (Signed)
Refill x1 

## 2017-07-31 NOTE — Telephone Encounter (Signed)
Rx printed, awaiting DO signature.

## 2017-07-31 NOTE — Telephone Encounter (Signed)
Rx faxed to [facility].

## 2017-08-20 ENCOUNTER — Other Ambulatory Visit: Payer: Self-pay | Admitting: Family Medicine

## 2017-08-20 ENCOUNTER — Telehealth: Payer: Self-pay

## 2017-08-20 NOTE — Telephone Encounter (Signed)
Refill x1--- will need f/u app

## 2017-08-20 NOTE — Telephone Encounter (Signed)
Sent req to PCP/thx dmf

## 2017-08-20 NOTE — Telephone Encounter (Signed)
Requesting: Tramadol Contract: 4.19.16 UDS: 8.28.17 low-risk Last OV: 6.25.18 Next OV: not sched/advised to F/U 3 months at last visit Last Refill: 8.10.18   Please advise

## 2017-08-21 MED ORDER — TRAMADOL HCL 50 MG PO TABS: ORAL_TABLET | ORAL | 0 refills | Status: DC

## 2017-08-21 NOTE — Telephone Encounter (Signed)
Printed Rx for 30d with note stating "needs appt before next refill." Will fax Tramadol when signed/thx dmf

## 2017-09-12 ENCOUNTER — Encounter: Payer: Self-pay | Admitting: Internal Medicine

## 2017-09-12 ENCOUNTER — Ambulatory Visit (INDEPENDENT_AMBULATORY_CARE_PROVIDER_SITE_OTHER): Payer: Medicare Other | Admitting: Internal Medicine

## 2017-09-12 VITALS — BP 126/52 | HR 67 | Ht 63.0 in | Wt 200.0 lb

## 2017-09-12 DIAGNOSIS — N183 Chronic kidney disease, stage 3 unspecified: Secondary | ICD-10-CM | POA: Insufficient documentation

## 2017-09-12 DIAGNOSIS — I5032 Chronic diastolic (congestive) heart failure: Secondary | ICD-10-CM | POA: Diagnosis not present

## 2017-09-12 DIAGNOSIS — I10 Essential (primary) hypertension: Secondary | ICD-10-CM

## 2017-09-12 DIAGNOSIS — I2510 Atherosclerotic heart disease of native coronary artery without angina pectoris: Secondary | ICD-10-CM | POA: Diagnosis not present

## 2017-09-12 DIAGNOSIS — I272 Pulmonary hypertension, unspecified: Secondary | ICD-10-CM | POA: Diagnosis not present

## 2017-09-12 MED ORDER — POTASSIUM CHLORIDE ER 20 MEQ PO TBCR: 1.0000 | EXTENDED_RELEASE_TABLET | Freq: Every day | ORAL | 11 refills | Status: DC

## 2017-09-12 NOTE — Patient Instructions (Addendum)
Your physician wants you to follow-up in [place] with Dr. Debara Pickett. You will receive a reminder letter in the mail two months in advance. If you don't receive a letter, please call our office to schedule the follow-up appointment.

## 2017-09-12 NOTE — Progress Notes (Signed)
OFFICE NOTE  Chief Complaint:  Follow-up heart failure  Primary Care Physician: Carollee Herter, Alferd Apa, DO  HPI:  Margaret Dyer is a 82 y.o. female with a past medial history significant for tobacco abuse, hypertension, dyslipidemia, cirrhosis, diabetes type 2 and lower extremity edema. She presented the hospital in June 2018 with several weeks of dyspnea on exertion and 10-15 pound weight gain. She had bilateral pleural effusions and an elevated BNP of 219. Echo showed an EF of 60-65% with grade 2 diastolic dysfunction. She underwent right heart catheterization by Dr. Ellyn Hack on 05/18/2017 which showed severe diastolic heart failure and an elevated pulmonary capillary wedge pressure 32 mmHg. There is also felt to be component of pulmonary arterial hypertension. Ultimately she had a [employer] which showed no reversible ischemia. This was performed due to history of coronary calcifications. She also has chronic kidney disease and we preferred to avoid any contrast dye exposure such as with left heart catheterization. She was seen in follow-up by Sharrell Ku, PA-C in July who ordered her stress test and felt that she was doing well from cardiac standpoint. Her weight was 203 pounds. Today it's 200 pounds. She continues to take her diuretic and feels like she is doing well without any worsening shortness of breath.  PMHx:  Past Medical History:  Diagnosis Date  . Asthma   . Blood transfusion   . Chronic diastolic CHF (congestive heart failure) ([place])   . CKD (chronic kidney disease), stage III ([place])   . Coronary artery calcification seen on CT scan   . Diabetes mellitus   . Goiter   . Hyperlipidemia   . Hypertension   . Hypothyroidism   . Liver cirrhosis ([place])   . NASH (nonalcoholic steatohepatitis)   . Obesity   . Osteopenia   . Pulmonary hypertension ([place])   . Renal cell carcinoma    a. prior nephrectomy.  . Right heart failure ([place])   . Thrombocytopenia ([place])     Past  Surgical History:  Procedure Laterality Date  . ABDOMINAL HYSTERECTOMY    . APPENDECTOMY    . HEMORRHOID SURGERY    . KNEE SURGERY    . NEPHRECTOMY  09.2000    FAMHx:  Family History  Problem Relation Age of Onset  . Cancer Sister        bladder  . Kidney cancer Father   . Cancer Father        renal  . COPD Father   . Congestive Heart Failure Father   . Coronary artery disease Unknown   . Diabetes Unknown   . Hyperlipidemia Unknown   . Hypertension Unknown   . Rheumatologic disease Neg Hx     SOCHx:   reports that she has quit smoking. She has never used smokeless tobacco. She reports that she does not drink alcohol or use drugs.  ALLERGIES:  Allergies  Allergen Reactions  . Cefuroxime Axetil     Upset stomach   . Ciprofloxacin     Upset stomach    ROS: Pertinent items noted in HPI and remainder of comprehensive ROS otherwise negative.  HOME MEDS: Current Outpatient Prescriptions on File Prior to Visit  Medication Sig Dispense Refill  . amLODipine (NORVASC) 10 MG tablet Take 1 tablet (10 mg total) by mouth daily. 30 tablet 1  . aspirin EC 81 MG tablet Take 1 tablet (81 mg total) by mouth daily. 90 tablet 3  . Ezetimibe-Simvastatin (VYTORIN PO) Take 1 tablet by mouth daily.    Marland Kitchen  furosemide (LASIX) 40 MG tablet Take 1 tablet (40 mg total) by mouth 2 (two) times daily. 60 tablet 3  . gabapentin (NEURONTIN) 100 MG capsule take 1 capsule by mouth twice a day (Patient taking differently: take 1 capsule by mouth twice a day as needed for pain) 60 capsule 5  . hydrALAZINE (APRESOLINE) 50 MG tablet Take 1 tablet (50 mg total) by mouth every 8 (eight) hours. 90 tablet 2  . insulin glargine (LANTUS) 100 UNIT/ML injection Inject 60 Units into the skin every evening.     . insulin glulisine (APIDRA) 100 UNIT/ML injection 40 u sq in am and 80 u sq q pm (Patient taking differently: Inject 40 Units into the skin every morning. ) 10 mL 11  . levothyroxine (SYNTHROID, LEVOTHROID)  25 MCG tablet take 1 tablet by mouth daily 30 tablet 5  . LORazepam (ATIVAN) 0.5 MG tablet Take 1 tablet (0.5 mg total) by mouth 2 (two) times daily as needed. 60 tablet 0  . metoprolol tartrate (LOPRESSOR) 25 MG tablet Take 1 tablet (25 mg total) by mouth 2 (two) times daily. 180 tablet 1  . oxybutynin (DITROPAN) 5 MG tablet Take 5 mg by mouth every morning.   0  . Potassium Chloride ER 20 MEQ TBCR Take 1 tablet by mouth daily. 30 tablet 1  . traMADol (ULTRAM) 50 MG tablet TAKE 2 TABLETS BY MOUTH TWICE DAILY AS NEEDED 120 tablet 0   No current facility-administered medications on file prior to visit.     LABS/IMAGING: No results found for this or any previous visit (from the past 48 hour(s)). No results found.  LIPID PANEL:    Component Value Date/Time   CHOL 160 05/28/2017 1630   TRIG 145.0 05/28/2017 1630   HDL 37.30 (L) 05/28/2017 1630   CHOLHDL 4 05/28/2017 1630   VLDL 29.0 05/28/2017 1630   LDLCALC 94 05/28/2017 1630   LDLDIRECT 89.0 01/19/2015 1659     WEIGHTS: Wt Readings from Last 3 Encounters:  09/12/17 200 lb (90.7 kg)  06/21/17 203 lb (92.1 kg)  06/05/17 203 lb 12.8 oz (92.4 kg)    VITALS: BP (!) 126/52   Pulse 67   Ht 5' 3" (1.6 m)   Wt 200 lb (90.7 kg)   LMP  (LMP Unknown)   BMI 35.43 kg/m   EXAM: General appearance: alert, no distress and moderately obese Neck: no carotid bruit, no JVD and thyroid not enlarged, symmetric, no tenderness/mass/nodules Lungs: diminished breath sounds bilaterally Heart: regular rate and rhythm Abdomen: soft, non-tender; bowel sounds normal; no masses,  no organomegaly Extremities: extremities normal, atraumatic, no cyanosis or edema Pulses: 2+ and symmetric Skin: Skin color, texture, turgor normal. No rashes or lesions Neurologic: Grossly normal Psych: Pleasant  EKG: Normal sinus rhythm at 67, anterolateral infarct pattern-personally reviewed - personally reviewed  ASSESSMENT: 1. Recent acute diastolic congestive  heart failure, LVEF 60-65% (05/2017) 2. Nonischemic Myoview with EF 71% (06/2017) 3. Hypertension 4. Cirrhosis 5. Insulin-dependent diabetes mellitus 6. Dyslipidemia  PLAN: 1.   [name] appears to be keeping the fluid off of related to her diastolic heart failure. Weight now is 3 pounds less than it was in July. She underwent a Myoview which was negative for ischemia and right heart cath had shown high filling pressures. LV was normal on echo. She appears to be on appropriate medications with good blood pressure control. Will continue her current medications and plan to see her back in 6 months or sooner as necessary.  Pixie Casino, MD, [place]  Attending Cardiologist  Direct Dial: [phone]  Fax: [phone]  Website:  [email or web address] Kyriaki Moder 09/12/2017, 1:58 PM

## 2017-09-22 DIAGNOSIS — Z789 Other specified health status: Secondary | ICD-10-CM | POA: Diagnosis not present

## 2017-09-22 DIAGNOSIS — L03113 Cellulitis of right upper limb: Secondary | ICD-10-CM | POA: Diagnosis not present

## 2017-09-26 ENCOUNTER — Other Ambulatory Visit: Payer: Self-pay | Admitting: Family Medicine

## 2017-10-01 ENCOUNTER — Other Ambulatory Visit: Payer: Self-pay | Admitting: Family Medicine

## 2017-10-03 NOTE — Telephone Encounter (Signed)
Relation to LM:RAJH Call back number:[phone]   Reason for call:  Patient checking on the status of LORazepam (ATIVAN) 0.5 MG tablet refill, please advise

## 2017-10-04 ENCOUNTER — Other Ambulatory Visit: Payer: Self-pay

## 2017-10-04 ENCOUNTER — Telehealth: Payer: Self-pay | Admitting: Family Medicine

## 2017-10-04 MED ORDER — LORAZEPAM 0.5 MG PO TABS: 0.5000 mg | ORAL_TABLET | Freq: Two times a day (BID) | ORAL | 0 refills | Status: DC | PRN

## 2017-10-04 NOTE — Telephone Encounter (Signed)
Pt would like LORAZEPAM called in to [facility] on [address].  Pt states would like it today. Pt states unable to come into the office today for appt.Please call pt to confirm [phone].

## 2017-10-04 NOTE — Telephone Encounter (Signed)
Need more info I know we don't have a clinical lead but all staff need to know how we handle refills

## 2017-10-04 NOTE — Telephone Encounter (Signed)
Patient requesting Ativan send to [facility] on [address] not a written script.

## 2017-10-05 NOTE — Telephone Encounter (Signed)
rx called in.  Call number in profile and they stated that she did not live there.  If pt calls her rx has been called in.

## 2017-10-08 NOTE — Telephone Encounter (Signed)
rx called in

## 2017-10-12 ENCOUNTER — Other Ambulatory Visit: Payer: Self-pay | Admitting: Family Medicine

## 2017-10-15 NOTE — Telephone Encounter (Signed)
Pt called in to check the status of her Tramadol refill request. Pt says that she need medication because she is having back pain.    Please assist further.

## 2017-10-15 NOTE — Telephone Encounter (Signed)
Pt called back says she requested TRMADOL last Friday 10/12/17 and has not heard back yet. Pt uses [facility] on [address]/ [address].

## 2017-10-16 ENCOUNTER — Telehealth: Payer: Self-pay | Admitting: Family Medicine

## 2017-10-16 ENCOUNTER — Encounter: Payer: Self-pay | Admitting: Family Medicine

## 2017-10-16 NOTE — Telephone Encounter (Signed)
Patient is having back pain & is calling to follow up on this refill request

## 2017-10-16 NOTE — Telephone Encounter (Signed)
Patient notified that rx was faxed to [facility] [address]

## 2017-10-16 NOTE — Telephone Encounter (Signed)
Patient is called in to requested for a Tramadol refilled. Patient wants RX to be sent to [facility] on [address]. Please call patient [phone].

## 2017-10-16 NOTE — Telephone Encounter (Signed)
Requesting: traMADol (ULTRAM) 50 MG tablet  Last OV: 05/28/17 Last Refill: 08/21/17  Please Advise

## 2017-10-17 NOTE — Telephone Encounter (Signed)
Patient notified and rx faxed to [facility] 10/16/17

## 2017-10-23 ENCOUNTER — Ambulatory Visit: Payer: Medicare Other | Admitting: Family Medicine

## 2017-10-24 ENCOUNTER — Telehealth: Payer: Self-pay | Admitting: Internal Medicine

## 2017-10-24 NOTE — Telephone Encounter (Signed)
Returned call to pt she states that she does not know who or why they called. She states that her machine cut them off. Will discuss with [facility] nurse and have her return call

## 2017-10-24 NOTE — Telephone Encounter (Signed)
New Message  Pt call requesting to speak with RN. Pt states she is returning a call. Please call back to discuss if needed

## 2017-10-24 NOTE — Telephone Encounter (Signed)
Called patient's cell phone - no answer, no VM Called home # on file and spoke with daughter [name]. Informed her that I was trying to reach pt to inform her that I could not locate anyone from our office who had tried to call her. She will notify patient.

## 2017-11-01 ENCOUNTER — Ambulatory Visit: Payer: Medicare Other | Admitting: Family Medicine

## 2017-11-05 DIAGNOSIS — M530 Cervicocranial syndrome: Secondary | ICD-10-CM | POA: Diagnosis not present

## 2017-11-05 DIAGNOSIS — M9901 Segmental and somatic dysfunction of cervical region: Secondary | ICD-10-CM | POA: Diagnosis not present

## 2017-11-05 DIAGNOSIS — M9907 Segmental and somatic dysfunction of upper extremity: Secondary | ICD-10-CM | POA: Diagnosis not present

## 2017-11-05 DIAGNOSIS — G5601 Carpal tunnel syndrome, right upper limb: Secondary | ICD-10-CM | POA: Diagnosis not present

## 2017-11-06 ENCOUNTER — Other Ambulatory Visit: Payer: Self-pay | Admitting: Family Medicine

## 2017-11-06 DIAGNOSIS — G5601 Carpal tunnel syndrome, right upper limb: Secondary | ICD-10-CM | POA: Diagnosis not present

## 2017-11-06 DIAGNOSIS — M9907 Segmental and somatic dysfunction of upper extremity: Secondary | ICD-10-CM | POA: Diagnosis not present

## 2017-11-06 DIAGNOSIS — M9901 Segmental and somatic dysfunction of cervical region: Secondary | ICD-10-CM | POA: Diagnosis not present

## 2017-11-06 DIAGNOSIS — M530 Cervicocranial syndrome: Secondary | ICD-10-CM | POA: Diagnosis not present

## 2017-11-06 DIAGNOSIS — G894 Chronic pain syndrome: Secondary | ICD-10-CM

## 2017-11-07 DIAGNOSIS — M9907 Segmental and somatic dysfunction of upper extremity: Secondary | ICD-10-CM | POA: Diagnosis not present

## 2017-11-07 DIAGNOSIS — M530 Cervicocranial syndrome: Secondary | ICD-10-CM | POA: Diagnosis not present

## 2017-11-07 DIAGNOSIS — M9901 Segmental and somatic dysfunction of cervical region: Secondary | ICD-10-CM | POA: Diagnosis not present

## 2017-11-07 DIAGNOSIS — G5601 Carpal tunnel syndrome, right upper limb: Secondary | ICD-10-CM | POA: Diagnosis not present

## 2017-11-09 ENCOUNTER — Other Ambulatory Visit: Payer: Self-pay | Admitting: Family Medicine

## 2017-11-09 ENCOUNTER — Encounter: Payer: Self-pay | Admitting: Family Medicine

## 2017-11-09 ENCOUNTER — Ambulatory Visit (INDEPENDENT_AMBULATORY_CARE_PROVIDER_SITE_OTHER): Payer: Medicare Other | Admitting: Family Medicine

## 2017-11-09 VITALS — BP 130/58 | HR 76 | Temp 98.0°F | Ht 63.6 in | Wt 194.0 lb

## 2017-11-09 DIAGNOSIS — I2510 Atherosclerotic heart disease of native coronary artery without angina pectoris: Secondary | ICD-10-CM

## 2017-11-09 DIAGNOSIS — M1990 Unspecified osteoarthritis, unspecified site: Secondary | ICD-10-CM

## 2017-11-09 DIAGNOSIS — Z794 Long term (current) use of insulin: Secondary | ICD-10-CM | POA: Diagnosis not present

## 2017-11-09 DIAGNOSIS — R32 Unspecified urinary incontinence: Secondary | ICD-10-CM

## 2017-11-09 DIAGNOSIS — N183 Chronic kidney disease, stage 3 (moderate): Secondary | ICD-10-CM | POA: Diagnosis not present

## 2017-11-09 DIAGNOSIS — E039 Hypothyroidism, unspecified: Secondary | ICD-10-CM | POA: Diagnosis not present

## 2017-11-09 DIAGNOSIS — F959 Tic disorder, unspecified: Secondary | ICD-10-CM

## 2017-11-09 DIAGNOSIS — E1122 Type 2 diabetes mellitus with diabetic chronic kidney disease: Secondary | ICD-10-CM | POA: Diagnosis not present

## 2017-11-09 DIAGNOSIS — E785 Hyperlipidemia, unspecified: Secondary | ICD-10-CM | POA: Diagnosis not present

## 2017-11-09 DIAGNOSIS — Z23 Encounter for immunization: Secondary | ICD-10-CM | POA: Diagnosis not present

## 2017-11-09 DIAGNOSIS — E1165 Type 2 diabetes mellitus with hyperglycemia: Secondary | ICD-10-CM | POA: Diagnosis not present

## 2017-11-09 DIAGNOSIS — F419 Anxiety disorder, unspecified: Secondary | ICD-10-CM | POA: Diagnosis not present

## 2017-11-09 MED ORDER — TRAMADOL HCL 50 MG PO TABS: ORAL_TABLET | ORAL | 1 refills | Status: DC

## 2017-11-09 MED ORDER — LORAZEPAM 0.5 MG PO TABS: 0.5000 mg | ORAL_TABLET | Freq: Two times a day (BID) | ORAL | 1 refills | Status: DC | PRN

## 2017-11-09 MED ORDER — OXYBUTYNIN CHLORIDE 5 MG PO TABS: 5.0000 mg | ORAL_TABLET | ORAL | 3 refills | Status: DC

## 2017-11-09 MED ORDER — LORAZEPAM 0.5 MG PO TABS: 0.5000 mg | ORAL_TABLET | Freq: Two times a day (BID) | ORAL | 0 refills | Status: DC | PRN

## 2017-11-09 NOTE — Assessment & Plan Note (Addendum)
Try ativan for tics --1/2 tab 2-3 x a day Refer to neuro

## 2017-11-09 NOTE — Patient Instructions (Signed)
Tic Disorders A tic disorder is a condition in which a person makes sudden and repeated movements or sounds (tics). There are three types of tic disorders:  Transient or provisional tic disorder (common). This type usually goes away within a year or two.  Chronic or persistent tic disorder. This type may last all through childhood and continue into the adult years.  Tourette syndrome (rare). This type lasts through all of life. It often occurs with other disorders.  Tic disorders starts before age 70, usually between age of 47 and 56. These disorders cannot be cured, but there are many treatments that can help manage tics. Most tic disorders get better over time. What are the causes? The cause of this condition is not known. What are the signs or symptoms? The main symptom of this condition is experiencing tics. There are four type of tics:  Simple motor tics. These are movements in one area of the body.  Complex motor tics. These are movements in large areas or in several areas of the body.  Simple vocal tics. These are single sounds.  Complex vocal tics. These are sounds that include several words or phrases.  Tics range in severity and may be more severe when you are stressed or tired. Tics can change over time. Symptoms of simple motor tics  Blinking, squinting, or eyebrow raising.  Nose wrinkling.  Mouth twitching, grimacing, or making tongue movements.  Head nodding or twisting.  Shoulder shrugging.  Arm jerking.  Foot shaking. Symptoms of complex motor tics  Grooming behavior, such as combing one's hair.  Smelling objects.  Jumping.  Imitating others' behavior.  Making rude or obscene gestures. Symptoms of simple vocal tics  Coughing.  Humming.  Throat clearing.  Grunting.  Yawning.  Sniffing.  Barking.  Snorting. Symptoms of complex vocal tics  Imitating what others say.  Saying words and sentences that may: ? Seem out of context. ? Be  rude. How is this diagnosed? This condition is diagnosed based on:  Your symptoms.  Your medical history.  A physical exam.  An exam of your nervous system (neurological exam).  Tests. These may be done to rule out other conditions that cause symptoms like tics. Tests may include: ? Blood tests. ? Brain imaging tests.  Your health care provider will ask you about:  The type of tics you have.  When the tics started and how often they happen.  How the tics affect your daily activities.  Other medical issues you may have.  Whether you take over-the-counter or prescription medicines.  Whether you use any drugs.  You may be referred to a brain and nerve specialist (neurologist) or a mental health specialist for further evaluation. How is this treated? Treatment for this condition depends on how severe your tics are. If they are mild, you may not need treatment. If they are more severe, you may benefit from treatment. Some treatments include:  Cognitive behavioral therapy. This kind of therapy involves talking to a mental health professional. The therapist can help you to: ? Become more aware of your tics. ? Learn ways to control your tics. ? Know how to disguise your tics.  Family therapy. This kind of therapy provides education and emotional support for your family members.  Medicine that helps to control tics.  Medicine that is injected into the body to relax muscles (botulinum toxin). This may be a treatment option if your tics are severe.  Electrical stimulation of the brain (deep brain stimulation). This  may be a treatment option if your tics are severe.  Follow these instructions at home:  Take over-the-counter and prescription medicines only as told by your health care provider.  Check with your health care provider before using any new prescription or over-the-counter medicines.  Keep all follow-up visits as told by your health care provider. This is  important. Contact a health care provider if:  You are not able to take your medicines as prescribed.  Your symptoms get worse.  Your symptoms are interfering with your ability to function normally at home, work, or school.  You have new or unusual symptoms like pain or weakness.  Your symptoms make you feel depressed or anxious. Summary  A tic disorder is a condition in which a person makes sudden and repeated movements or sounds.  Tic disorders start before age 13, usually between the age of 38 and 63.  Many tic disorders are mild and do not need treatment.  These disorders cannot be cured, but there are many treatments that can help manage tics. This information is not intended to replace advice given to you by your health care provider. Make sure you discuss any questions you have with your health care provider. Document Released: 07/23/2013 Document Revised: 12/08/2016 Document Reviewed: 12/08/2016 Elsevier Interactive Patient Education  2017 [employer].

## 2017-11-09 NOTE — Assessment & Plan Note (Signed)
Refill ultram
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
## 2017-11-19 DIAGNOSIS — M9907 Segmental and somatic dysfunction of upper extremity: Secondary | ICD-10-CM | POA: Diagnosis not present

## 2017-11-19 DIAGNOSIS — M530 Cervicocranial syndrome: Secondary | ICD-10-CM | POA: Diagnosis not present

## 2017-11-19 DIAGNOSIS — M9901 Segmental and somatic dysfunction of cervical region: Secondary | ICD-10-CM | POA: Diagnosis not present

## 2017-11-19 DIAGNOSIS — G5601 Carpal tunnel syndrome, right upper limb: Secondary | ICD-10-CM | POA: Diagnosis not present

## 2017-11-21 DIAGNOSIS — M530 Cervicocranial syndrome: Secondary | ICD-10-CM | POA: Diagnosis not present

## 2017-11-21 DIAGNOSIS — M9901 Segmental and somatic dysfunction of cervical region: Secondary | ICD-10-CM | POA: Diagnosis not present

## 2017-11-21 DIAGNOSIS — M9907 Segmental and somatic dysfunction of upper extremity: Secondary | ICD-10-CM | POA: Diagnosis not present

## 2017-11-21 DIAGNOSIS — G5601 Carpal tunnel syndrome, right upper limb: Secondary | ICD-10-CM | POA: Diagnosis not present

## 2017-11-30 ENCOUNTER — Other Ambulatory Visit: Payer: Self-pay

## 2017-11-30 ENCOUNTER — Encounter (HOSPITAL_COMMUNITY): Payer: Self-pay | Admitting: Emergency Medicine

## 2017-11-30 ENCOUNTER — Encounter: Payer: Self-pay | Admitting: Medical

## 2017-11-30 ENCOUNTER — Ambulatory Visit (INDEPENDENT_AMBULATORY_CARE_PROVIDER_SITE_OTHER): Payer: Medicare Other | Admitting: Medical

## 2017-11-30 ENCOUNTER — Telehealth: Payer: Self-pay | Admitting: Family Medicine

## 2017-11-30 ENCOUNTER — Ambulatory Visit: Payer: Self-pay | Admitting: Hematology

## 2017-11-30 ENCOUNTER — Inpatient Hospital Stay (HOSPITAL_COMMUNITY)
Admission: EM | Admit: 2017-11-30 | Discharge: 2017-12-05 | DRG: 291 | Disposition: A | Discharge status: Home Health | Payer: Medicare Other | Attending: Internal Medicine | Admitting: Internal Medicine

## 2017-11-30 VITALS — BP 131/51 | HR 61 | Temp 97.9°F | Resp 16 | Ht 63.5 in | Wt 195.0 lb

## 2017-11-30 DIAGNOSIS — F329 Major depressive disorder, single episode, unspecified: Secondary | ICD-10-CM | POA: Diagnosis present

## 2017-11-30 DIAGNOSIS — I5032 Chronic diastolic (congestive) heart failure: Secondary | ICD-10-CM | POA: Diagnosis present

## 2017-11-30 DIAGNOSIS — R0609 Other forms of dyspnea: Secondary | ICD-10-CM | POA: Diagnosis not present

## 2017-11-30 DIAGNOSIS — I130 Hypertensive heart and chronic kidney disease with heart failure and stage 1 through stage 4 chronic kidney disease, or unspecified chronic kidney disease: Principal | ICD-10-CM | POA: Diagnosis present

## 2017-11-30 DIAGNOSIS — I10 Essential (primary) hypertension: Secondary | ICD-10-CM

## 2017-11-30 DIAGNOSIS — R0600 Dyspnea, unspecified: Secondary | ICD-10-CM

## 2017-11-30 DIAGNOSIS — Z794 Long term (current) use of insulin: Secondary | ICD-10-CM

## 2017-11-30 DIAGNOSIS — Z85528 Personal history of other malignant neoplasm of kidney: Secondary | ICD-10-CM

## 2017-11-30 DIAGNOSIS — J45909 Unspecified asthma, uncomplicated: Secondary | ICD-10-CM | POA: Diagnosis present

## 2017-11-30 DIAGNOSIS — F32A Depression, unspecified: Secondary | ICD-10-CM | POA: Diagnosis present

## 2017-11-30 DIAGNOSIS — J189 Pneumonia, unspecified organism: Secondary | ICD-10-CM | POA: Diagnosis not present

## 2017-11-30 DIAGNOSIS — E785 Hyperlipidemia, unspecified: Secondary | ICD-10-CM | POA: Diagnosis not present

## 2017-11-30 DIAGNOSIS — E669 Obesity, unspecified: Secondary | ICD-10-CM | POA: Diagnosis present

## 2017-11-30 DIAGNOSIS — M8580 Other specified disorders of bone density and structure, unspecified site: Secondary | ICD-10-CM | POA: Diagnosis present

## 2017-11-30 DIAGNOSIS — Z905 Acquired absence of kidney: Secondary | ICD-10-CM

## 2017-11-30 DIAGNOSIS — R0602 Shortness of breath: Secondary | ICD-10-CM | POA: Diagnosis not present

## 2017-11-30 DIAGNOSIS — R0902 Hypoxemia: Secondary | ICD-10-CM

## 2017-11-30 DIAGNOSIS — E039 Hypothyroidism, unspecified: Secondary | ICD-10-CM | POA: Diagnosis not present

## 2017-11-30 DIAGNOSIS — E119 Type 2 diabetes mellitus without complications: Secondary | ICD-10-CM

## 2017-11-30 DIAGNOSIS — I2510 Atherosclerotic heart disease of native coronary artery without angina pectoris: Secondary | ICD-10-CM | POA: Diagnosis present

## 2017-11-30 DIAGNOSIS — Z888 Allergy status to other drugs, medicaments and biological substances status: Secondary | ICD-10-CM

## 2017-11-30 DIAGNOSIS — Z881 Allergy status to other antibiotic agents status: Secondary | ICD-10-CM

## 2017-11-30 DIAGNOSIS — E1169 Type 2 diabetes mellitus with other specified complication: Secondary | ICD-10-CM | POA: Diagnosis not present

## 2017-11-30 DIAGNOSIS — IMO0002 Reserved for concepts with insufficient information to code with codable children: Secondary | ICD-10-CM

## 2017-11-30 DIAGNOSIS — I5031 Acute diastolic (congestive) heart failure: Secondary | ICD-10-CM

## 2017-11-30 DIAGNOSIS — E1165 Type 2 diabetes mellitus with hyperglycemia: Secondary | ICD-10-CM

## 2017-11-30 DIAGNOSIS — R911 Solitary pulmonary nodule: Secondary | ICD-10-CM | POA: Diagnosis present

## 2017-11-30 DIAGNOSIS — Z79899 Other long term (current) drug therapy: Secondary | ICD-10-CM

## 2017-11-30 DIAGNOSIS — Z8249 Family history of ischemic heart disease and other diseases of the circulatory system: Secondary | ICD-10-CM

## 2017-11-30 DIAGNOSIS — I5033 Acute on chronic diastolic (congestive) heart failure: Secondary | ICD-10-CM | POA: Diagnosis present

## 2017-11-30 DIAGNOSIS — F419 Anxiety disorder, unspecified: Secondary | ICD-10-CM | POA: Diagnosis present

## 2017-11-30 DIAGNOSIS — I509 Heart failure, unspecified: Secondary | ICD-10-CM | POA: Diagnosis not present

## 2017-11-30 DIAGNOSIS — R062 Wheezing: Secondary | ICD-10-CM | POA: Diagnosis not present

## 2017-11-30 DIAGNOSIS — I700 Atherosclerosis of aorta: Secondary | ICD-10-CM | POA: Diagnosis present

## 2017-11-30 DIAGNOSIS — Z87891 Personal history of nicotine dependence: Secondary | ICD-10-CM

## 2017-11-30 DIAGNOSIS — E1122 Type 2 diabetes mellitus with diabetic chronic kidney disease: Secondary | ICD-10-CM | POA: Diagnosis present

## 2017-11-30 DIAGNOSIS — N183 Chronic kidney disease, stage 3 (moderate): Secondary | ICD-10-CM | POA: Diagnosis not present

## 2017-11-30 LAB — COMPREHENSIVE METABOLIC PANEL
AG RATIO: 1.6 (calc) (ref 1.0–2.5)
ALT: 24 U/L (ref 6–29)
AST: 24 U/L (ref 10–35)
Albumin: 4.1 g/dL (ref 3.6–5.1)
Alkaline phosphatase (APISO): 84 U/L (ref 33–130)
BUN / CREAT RATIO: 26 (calc) — AB (ref 6–22)
BUN: 28 mg/dL — ABNORMAL HIGH (ref 7–25)
CO2: 30 mmol/L (ref 20–32)
CREATININE: 1.06 mg/dL — AB (ref 0.60–0.88)
Calcium: 9.4 mg/dL (ref 8.6–10.4)
Chloride: 102 mmol/L (ref 98–110)
GLUCOSE: 135 mg/dL — AB (ref 65–99)
Globulin: 2.5 g/dL (calc) (ref 1.9–3.7)
Potassium: 3.8 mmol/L (ref 3.5–5.3)
SODIUM: 142 mmol/L (ref 135–146)
TOTAL PROTEIN: 6.6 g/dL (ref 6.1–8.1)
Total Bilirubin: 0.8 mg/dL (ref 0.2–1.2)

## 2017-11-30 LAB — CBC WITH DIFFERENTIAL/PLATELET
BASOS PCT: 0.7 %
Basophils Absolute: 60 cells/uL (ref 0–200)
EOS ABS: 120 {cells}/uL (ref 15–500)
EOS PCT: 1.4 %
HCT: 41.4 % (ref 35.0–45.0)
HEMOGLOBIN: 14.1 g/dL (ref 11.7–15.5)
Lymphs Abs: 1333 cells/uL (ref 850–3900)
MCH: 29.7 pg (ref 27.0–33.0)
MCHC: 34.1 g/dL (ref 32.0–36.0)
MCV: 87.3 fL (ref 80.0–100.0)
MONOS PCT: 11.8 %
MPV: 12.8 fL — AB (ref 7.5–12.5)
NEUTROS ABS: 6072 {cells}/uL (ref 1500–7800)
Neutrophils Relative %: 70.6 %
PLATELETS: 153 10*3/uL (ref 140–400)
RBC: 4.74 10*6/uL (ref 3.80–5.10)
RDW: 13.4 % (ref 11.0–15.0)
TOTAL LYMPHOCYTE: 15.5 %
WBC mixed population: 1015 cells/uL — ABNORMAL HIGH (ref 200–950)
WBC: 8.6 10*3/uL (ref 3.8–10.8)

## 2017-11-30 LAB — D-DIMER, QUANTITATIVE (NOT AT ARMC): D DIMER QUANT: 0.73 ug{FEU}/mL — AB (ref <0.50)

## 2017-11-30 LAB — BRAIN NATRIURETIC PEPTIDE: Brain Natriuretic Peptide: 153 pg/mL — ABNORMAL HIGH (ref <100)

## 2017-11-30 LAB — TROPONIN I

## 2017-11-30 MED ORDER — ALBUTEROL SULFATE (2.5 MG/3ML) 0.083% IN NEBU: 2.5000 mg | INHALATION_SOLUTION | Freq: Four times a day (QID) | RESPIRATORY_TRACT | 1 refills | Status: DC | PRN

## 2017-11-30 MED ORDER — IPRATROPIUM BROMIDE HFA 17 MCG/ACT IN AERS: 2.0000 | INHALATION_SPRAY | Freq: Four times a day (QID) | RESPIRATORY_TRACT | 12 refills | Status: DC | PRN

## 2017-11-30 MED ORDER — ALBUTEROL SULFATE HFA 108 (90 BASE) MCG/ACT IN AERS: 2.0000 | INHALATION_SPRAY | Freq: Four times a day (QID) | RESPIRATORY_TRACT | 2 refills | Status: DC | PRN

## 2017-11-30 MED ORDER — BECLOMETHASONE DIPROP HFA 40 MCG/ACT IN AERB: 2.0000 | INHALATION_SPRAY | Freq: Two times a day (BID) | RESPIRATORY_TRACT | 0 refills | Status: DC

## 2017-11-30 NOTE — Telephone Encounter (Signed)
Pt has appt later today w/ Percell Miller.

## 2017-11-30 NOTE — ED Notes (Signed)
ED Provider at bedside. 
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
## 2017-11-30 NOTE — Telephone Encounter (Signed)
Called from [facility] regarding stat labs  Elevated BNP, nl Troponin  DDimer which was pending returned elevated Lab Results  Component Value Date   DDIMER 0.73 (H) 11/30/2017    Called pt and informed she and her daughter that she needs to be seen in the ER due to concern of blood clot   Daughter will take her to [facility] ED now  She is still sob but told me not as much as last night   inst to call back if questions or concerns

## 2017-11-30 NOTE — Telephone Encounter (Signed)
Patient states she had a prescription for a nebulizer from when she was released from the hospital back in June.  Patient states she never picked it up and was told yesterday she needs a new prescription.  Patient states she has been short of breath off and on since being discharged.  Patient denies difficulty breathing, chest pain, sweating.  Patient states she gets short of breath walking around , but when she sits down it is better. Patient states this has been an issue since June.  Answer Assessment - Initial Assessment Questions 1. RESPIRATORY STATUS: "Describe your breathing?" (e.g., wheezing, shortness of breath, unable to speak, severe coughing)      Short of breath 2. ONSET: "When did this breathing problem begin?"    Started back in june 3. PATTERN "Does the difficult breathing come and go, or has it been constant since it started?"      Comes and goes depending on her activity  5. RECURRENT SYMPTOM: "Have you had difficulty breathing before?" If so, ask: "When was the last time?" and "What happened that time?"      *No Answer* 6. CARDIAC HISTORY: "Do you have any history of heart disease?" (e.g., heart attack, angina, bypass surgery, angioplasty)   HTN 9. Asthma as a child  45. OTHER SYMPTOMS: "Do you have any other symptoms? (e.g., dizziness, runny nose, cough, chest pain, fever)     *No Answer*  Protocols used: BREATHING DIFFICULTY-A-AH

## 2017-11-30 NOTE — Telephone Encounter (Signed)
Follow up call made to patient no answer,left message for return call on answering machine.

## 2017-11-30 NOTE — Telephone Encounter (Signed)
Sounds like she needs to be seen in office or ED if ongoing issue.

## 2017-11-30 NOTE — ED Triage Notes (Signed)
Pt to ED by [facility] for evaluation of SOB after going to PCP today. Office called her back tonight and informed of elevated d dimer and suggested eval at ER for possible PE. Pt endorses SOB, mild dull pain in chest, and more severe pain in central back under shoulder blades. Pt in NAD at this time

## 2017-11-30 NOTE — Patient Instructions (Addendum)
With your recent shortness of breath on exertion and your complicated medical history, I had advised you various times better to be seen in the emergency department since stat labs come back much quicker than our outpatient stat labs.  Also they can do repeat troponin studies and other tests such as CT thorax in the event your d-dimer was positive.  But you declined ED evaluation so we need to watch you closely.  If your symptoms worsen despite negative test results and outpatient treatment then would still recommend ED evaluation over the weekend.  With your CHF history continue your Lasix.  We are getting a chest x-ray, BNP and CMP.  To evaluate differential diagnosis of your shortness of breath , I am ordering a d-dimer.  If the d-dimer were to be positive then would recommend you be seen at the emergency department.  I think you would need a CT of chest in that event.   Also ordering a troponin test.  One set of troponin is not adequate to rule out heart condition.  The advantage of the emergency department is that he could repeat this test.  We are giving you to mask to use and tubing to use  with your daughter's neb machine.  I sent an albuterol neb solution.  Also sent in  prescription for Atrovent inhaler, albuterol inhaler and Qvar.  This will be helpful in the event of asthma flare.   Follow-up in 5 days or as needed

## 2017-12-01 ENCOUNTER — Emergency Department (HOSPITAL_COMMUNITY): Payer: Medicare Other

## 2017-12-01 ENCOUNTER — Other Ambulatory Visit: Payer: Self-pay

## 2017-12-01 ENCOUNTER — Encounter (HOSPITAL_COMMUNITY): Payer: Self-pay

## 2017-12-01 DIAGNOSIS — I10 Essential (primary) hypertension: Secondary | ICD-10-CM | POA: Diagnosis not present

## 2017-12-01 DIAGNOSIS — Z794 Long term (current) use of insulin: Secondary | ICD-10-CM | POA: Diagnosis not present

## 2017-12-01 DIAGNOSIS — R0602 Shortness of breath: Secondary | ICD-10-CM | POA: Diagnosis not present

## 2017-12-01 DIAGNOSIS — R0600 Dyspnea, unspecified: Secondary | ICD-10-CM | POA: Diagnosis not present

## 2017-12-01 DIAGNOSIS — F329 Major depressive disorder, single episode, unspecified: Secondary | ICD-10-CM | POA: Diagnosis not present

## 2017-12-01 DIAGNOSIS — J189 Pneumonia, unspecified organism: Secondary | ICD-10-CM | POA: Diagnosis present

## 2017-12-01 DIAGNOSIS — F419 Anxiety disorder, unspecified: Secondary | ICD-10-CM | POA: Diagnosis not present

## 2017-12-01 DIAGNOSIS — E119 Type 2 diabetes mellitus without complications: Secondary | ICD-10-CM | POA: Diagnosis not present

## 2017-12-01 LAB — CBG MONITORING, ED
GLUCOSE-CAPILLARY: 195 mg/dL — AB (ref 65–99)
GLUCOSE-CAPILLARY: 182 mg/dL — AB (ref 65–99)
Glucose-Capillary: 177 mg/dL — ABNORMAL HIGH (ref 65–99)
Glucose-Capillary: 200 mg/dL — ABNORMAL HIGH (ref 65–99)

## 2017-12-01 LAB — TROPONIN I

## 2017-12-01 LAB — GLUCOSE, CAPILLARY: Glucose-Capillary: 154 mg/dL — ABNORMAL HIGH (ref 65–99)

## 2017-12-01 LAB — I-STAT TROPONIN, ED: TROPONIN I, POC: 0 ng/mL (ref 0.00–0.08)

## 2017-12-01 LAB — PROCALCITONIN

## 2017-12-01 MED ORDER — ASPIRIN EC 81 MG PO TBEC
81.0000 mg | DELAYED_RELEASE_TABLET | Freq: Every day | ORAL | Status: DC
  Administered 2017-12-01 – 2017-12-05 (×5): 81 mg via ORAL
  Filled 2017-12-01 (×5): qty 1

## 2017-12-01 MED ORDER — FUROSEMIDE 40 MG PO TABS
40.0000 mg | ORAL_TABLET | Freq: Two times a day (BID) | ORAL | Status: DC
  Administered 2017-12-01 – 2017-12-02 (×3): 40 mg via ORAL
  Filled 2017-12-01 (×2): qty 1
  Filled 2017-12-01: qty 2

## 2017-12-01 MED ORDER — METOPROLOL TARTRATE 25 MG PO TABS
25.0000 mg | ORAL_TABLET | Freq: Two times a day (BID) | ORAL | Status: DC
  Administered 2017-12-01 – 2017-12-05 (×9): 25 mg via ORAL
  Filled 2017-12-01 (×9): qty 1

## 2017-12-01 MED ORDER — INSULIN GLARGINE 100 UNIT/ML ~~LOC~~ SOLN
45.0000 [IU] | Freq: Every day | SUBCUTANEOUS | Status: DC
  Administered 2017-12-04: 45 [IU] via SUBCUTANEOUS
  Filled 2017-12-01 (×3): qty 0.45

## 2017-12-01 MED ORDER — ENSURE ENLIVE PO LIQD
237.0000 mL | Freq: Two times a day (BID) | ORAL | Status: DC
  Administered 2017-12-02 – 2017-12-05 (×7): 237 mL via ORAL

## 2017-12-01 MED ORDER — POTASSIUM CHLORIDE CRYS ER 20 MEQ PO TBCR
20.0000 meq | EXTENDED_RELEASE_TABLET | Freq: Every day | ORAL | Status: DC
  Administered 2017-12-01 – 2017-12-02 (×2): 20 meq via ORAL
  Filled 2017-12-01 (×2): qty 1

## 2017-12-01 MED ORDER — OXYBUTYNIN CHLORIDE 5 MG PO TABS
5.0000 mg | ORAL_TABLET | Freq: Every day | ORAL | Status: DC
  Administered 2017-12-01 – 2017-12-05 (×5): 5 mg via ORAL
  Filled 2017-12-01 (×5): qty 1

## 2017-12-01 MED ORDER — DEXTROSE 5 % IV SOLN
500.0000 mg | INTRAVENOUS | Status: DC
  Administered 2017-12-02 – 2017-12-03 (×2): 500 mg via INTRAVENOUS
  Filled 2017-12-01 (×2): qty 500

## 2017-12-01 MED ORDER — ENOXAPARIN SODIUM 40 MG/0.4ML ~~LOC~~ SOLN
40.0000 mg | SUBCUTANEOUS | Status: DC
  Administered 2017-12-01 – 2017-12-04 (×4): 40 mg via SUBCUTANEOUS
  Filled 2017-12-01 (×4): qty 0.4

## 2017-12-01 MED ORDER — AMLODIPINE BESYLATE 10 MG PO TABS
10.0000 mg | ORAL_TABLET | Freq: Every morning | ORAL | Status: DC
  Administered 2017-12-01 – 2017-12-02 (×2): 10 mg via ORAL
  Filled 2017-12-01: qty 2
  Filled 2017-12-01: qty 1

## 2017-12-01 MED ORDER — INSULIN ASPART 100 UNIT/ML ~~LOC~~ SOLN
15.0000 [IU] | Freq: Three times a day (TID) | SUBCUTANEOUS | Status: DC
  Administered 2017-12-01 – 2017-12-05 (×11): 15 [IU] via SUBCUTANEOUS
  Filled 2017-12-01 (×2): qty 1

## 2017-12-01 MED ORDER — TRAMADOL HCL 50 MG PO TABS
50.0000 mg | ORAL_TABLET | Freq: Once | ORAL | Status: AC
  Administered 2017-12-01: 50 mg via ORAL
  Filled 2017-12-01: qty 1

## 2017-12-01 MED ORDER — INSULIN GLARGINE 100 UNIT/ML ~~LOC~~ SOLN
50.0000 [IU] | Freq: Every day | SUBCUTANEOUS | Status: DC
  Administered 2017-12-01: 50 [IU] via SUBCUTANEOUS
  Filled 2017-12-01 (×2): qty 0.5

## 2017-12-01 MED ORDER — IPRATROPIUM-ALBUTEROL 0.5-2.5 (3) MG/3ML IN SOLN
3.0000 mL | Freq: Once | RESPIRATORY_TRACT | Status: AC
  Administered 2017-12-01: 3 mL via RESPIRATORY_TRACT
  Filled 2017-12-01: qty 3

## 2017-12-01 MED ORDER — LEVOTHYROXINE SODIUM 25 MCG PO TABS
25.0000 ug | ORAL_TABLET | Freq: Every day | ORAL | Status: DC
  Administered 2017-12-02 – 2017-12-05 (×4): 25 ug via ORAL
  Filled 2017-12-01 (×4): qty 1

## 2017-12-01 MED ORDER — AZITHROMYCIN 250 MG PO TABS
500.0000 mg | ORAL_TABLET | Freq: Once | ORAL | Status: AC
  Administered 2017-12-01: 500 mg via ORAL
  Filled 2017-12-01: qty 2

## 2017-12-01 MED ORDER — DEXTROSE 5 % IV SOLN
1.0000 g | Freq: Once | INTRAVENOUS | Status: AC
  Administered 2017-12-01: 1 g via INTRAVENOUS
  Filled 2017-12-01: qty 10

## 2017-12-01 MED ORDER — LORAZEPAM 0.5 MG PO TABS
0.5000 mg | ORAL_TABLET | Freq: Two times a day (BID) | ORAL | Status: DC | PRN
  Filled 2017-12-01: qty 1

## 2017-12-01 MED ORDER — HYDRALAZINE HCL 50 MG PO TABS
50.0000 mg | ORAL_TABLET | Freq: Three times a day (TID) | ORAL | Status: DC
  Administered 2017-12-01: 50 mg via ORAL
  Filled 2017-12-01 (×2): qty 1
  Filled 2017-12-01 (×2): qty 2

## 2017-12-01 MED ORDER — IOPAMIDOL (ISOVUE-370) INJECTION 76%
INTRAVENOUS | Status: AC
  Administered 2017-12-01: 100 mL
  Filled 2017-12-01: qty 100

## 2017-12-01 MED ORDER — DEXTROSE 5 % IV SOLN
1.0000 g | INTRAVENOUS | Status: DC
  Administered 2017-12-02 – 2017-12-03 (×2): 1 g via INTRAVENOUS
  Filled 2017-12-01 (×2): qty 10

## 2017-12-01 MED ORDER — TRAMADOL HCL 50 MG PO TABS
50.0000 mg | ORAL_TABLET | Freq: Four times a day (QID) | ORAL | Status: DC | PRN
  Administered 2017-12-02 – 2017-12-05 (×3): 50 mg via ORAL
  Filled 2017-12-01 (×3): qty 1

## 2017-12-01 MED ORDER — GABAPENTIN 100 MG PO CAPS
100.0000 mg | ORAL_CAPSULE | Freq: Two times a day (BID) | ORAL | Status: DC
  Administered 2017-12-01 – 2017-12-05 (×9): 100 mg via ORAL
  Filled 2017-12-01 (×9): qty 1

## 2017-12-01 MED ORDER — ALBUTEROL SULFATE (2.5 MG/3ML) 0.083% IN NEBU
2.5000 mg | INHALATION_SOLUTION | Freq: Four times a day (QID) | RESPIRATORY_TRACT | Status: DC | PRN
  Administered 2017-12-02: 2.5 mg via RESPIRATORY_TRACT
  Filled 2017-12-01: qty 3

## 2017-12-01 NOTE — ED Notes (Signed)
Heart Healthy/Carb Modified diet lunch tray ordered @ 1022.

## 2017-12-01 NOTE — Progress Notes (Signed)
Pt admitted to the unit at 1800. Pt mental status is alert and oriented. Pt oriented to room, staff, and call bell. Skin is intact.  Full assessment charted in [facility]. Call bell within reach. Visitor guidelines reviewed w/ pt and/or family.

## 2017-12-01 NOTE — ED Provider Notes (Signed)
[facility] Provider Note   CSN: [id number] Arrival date & time: 11/30/17  2336     History   Chief Complaint Chief Complaint  Patient presents with  . Shortness of Breath    HPI Margaret Dyer is a 82 y.o. female.  The history is provided by the patient.  Shortness of Breath  This is a new problem. The current episode started yesterday. The problem has been gradually worsening. Pertinent negatives include no fever, no hemoptysis, no syncope and no abdominal pain. She has tried nothing for the symptoms. Associated medical issues include heart failure. Associated medical issues do not include PE.  Patient reports onset of shortness of breath over the past 12-24 hours SHe reports it is worsening, worsening with exertion. She reports that she has never felt fully back to baseline after her admission in June for heart failure Reports this episode started in the past 12-24 hours She reports very minimal chest tightness, none at this time, no chest pain on exertion, no pleuritic chest pain. Seen by PCP on 12/28, had labs done and was called to go to ER for evaluation Past Medical History:  Diagnosis Date  . Asthma   . Blood transfusion   . Chronic diastolic CHF (congestive heart failure) ([place])   . CKD (chronic kidney disease), stage III ([place])   . Coronary artery calcification seen on CT scan   . Diabetes mellitus   . Goiter   . Hyperlipidemia   . Hypertension   . Hypothyroidism   . Liver cirrhosis ([place])   . NASH (nonalcoholic steatohepatitis)   . Obesity   . Osteopenia   . Pulmonary hypertension ([place])   . Renal cell carcinoma    a. prior nephrectomy.  . Right heart failure ([place])   . Thrombocytopenia [facility])     Patient Active Problem List   Diagnosis Date Noted  . Urinary incontinence 11/09/2017  . Tic 11/09/2017  . CKD (chronic kidney disease), stage III ([place]) 09/12/2017  . Pulmonary hypertension ([place]) 09/12/2017  . Chronic  diastolic CHF (congestive heart failure) ([place]) 06/05/2017  . Oral candida 05/29/2017  . Vitamin D deficiency 05/29/2017  . Acute on chronic respiratory failure with hypoxia ([place]) 05/24/2017  . Anxiety 05/24/2017  . HCAP (healthcare-associated pneumonia) 05/22/2017  . Right heart failure ([place]) 05/17/2017  . Acute diastolic CHF (congestive heart failure) ([place])   . Exertional dyspnea 05/13/2017  . Hypertension 05/13/2017  . Asthma 05/13/2017  . DOE (dyspnea on exertion) 05/13/2017  . Diabetes mellitus type 2, insulin dependent ([place]) 05/13/2017  . Acute kidney injury ([place]) 05/13/2017  . History of nephrectomy 05/13/2017  . Hyperlipidemia LDL goal <70 05/13/2017  . Liver cirrhosis secondary to NASH ([place]) 05/13/2017  . Thrombocytopenia ([place]) 05/13/2017  . Renal cell carcinoma 05/13/2017  . Hypothyroidism 05/13/2017  . Hypersplenism 05/13/2017  . Osteoarthritis 05/13/2017  . Overactive bladder 05/13/2017  . URI, acute 02/22/2017  . Abrasion, left knee, initial encounter 02/22/2017  . CTS (carpal tunnel syndrome) 03/04/2016  . Bilateral contusion of ribs 10/05/2015  . Dental abscess 04/15/2014  . Claudication, intermittent ([place]) 12/05/2013  . Obesity (BMI 30-39.9) 07/22/2013  . Back pain 05/26/2013  . Platelets decreased ([place]) 05/26/2013  . Breast pain, right 04/21/2013  . Anxiety and depression 12/05/2012  . Bronchitis 11/06/2012  . HEMATURIA UNSPECIFIED 02/17/2011  . Hyperlipidemia 09/08/2010  . B12 DEFICIENCY 07/22/2010  . DIZZINESS 07/04/2010  . Essential hypertension 06/21/2010  . GOUT, UNSPECIFIED 01/27/2010  . NEPHRECTOMY, HX  OF 12/02/2009  . NECK PAIN, LEFT 09/04/2008  . PARESTHESIA 09/02/2007  . GOITER NOS 04/23/2007  . Diabetes mellitus ([place]) 04/23/2007  . Asthma 04/23/2007  . CALCULUS, KIDNEY 04/23/2007  . OSTEOPENIA 04/23/2007  . CARCINOMA, RENAL CELL 08/10/1999    Past Surgical History:  Procedure Laterality Date  . ABDOMINAL HYSTERECTOMY    . APPENDECTOMY     . HEMORRHOID SURGERY    . KNEE SURGERY    . NEPHRECTOMY  09.2000    OB History    No data available       Home Medications    Prior to Admission medications   Medication Sig Start Date End Date Taking? Authorizing Provider  albuterol (PROVENTIL HFA;VENTOLIN HFA) 108 (90 Base) MCG/ACT inhaler Inhale 2 puffs into the lungs every 6 (six) hours as needed for wheezing or shortness of breath. 11/30/17   Saguier, Percell Miller, PA-C  albuterol (PROVENTIL) (2.5 MG/3ML) 0.083% nebulizer solution Take 3 mLs (2.5 mg total) by nebulization every 6 (six) hours as needed for wheezing or shortness of breath. 11/30/17   Saguier, Percell Miller, PA-C  amLODipine (NORVASC) 10 MG tablet take 1 tablet by mouth every morning 09/27/17   Mosie Lukes, MD  aspirin EC 81 MG tablet Take 1 tablet (81 mg total) by mouth daily. 06/05/17   Dunn, Nedra Hai, PA-C  beclomethasone (QVAR REDIHALER) 40 MCG/ACT inhaler Inhale 2 puffs into the lungs 2 (two) times daily. 11/30/17   Saguier, Percell Miller, PA-C  Ezetimibe-Simvastatin (VYTORIN PO) Take 1 tablet by mouth daily.    [provider]  furosemide (LASIX) 40 MG tablet Take 1 tablet (40 mg total) by mouth 2 (two) times daily. 06/11/17   Ann Held, DO  gabapentin (NEURONTIN) 100 MG capsule take 1 capsule by mouth twice a day 11/07/17   Carollee Herter, Alferd Apa, DO  hydrALAZINE (APRESOLINE) 50 MG tablet Take 1 tablet (50 mg total) by mouth every 8 (eight) hours. 07/04/17   Hilty, Nadean Corwin, MD  insulin glargine (LANTUS) 100 UNIT/ML injection Inject 60 Units into the skin every evening.     [provider]  insulin glulisine (APIDRA) 100 UNIT/ML injection 40 u sq in am and 80 u sq q pm Patient taking differently: Inject 40 Units into the skin every morning.  02/19/15   Roma Schanz R, DO  ipratropium (ATROVENT HFA) 17 MCG/ACT inhaler Inhale 2 puffs into the lungs every 6 (six) hours as needed for wheezing. 11/30/17   Saguier, Percell Miller, PA-C  levothyroxine (SYNTHROID,  LEVOTHROID) 25 MCG tablet take 1 tablet by mouth daily 07/13/17   Carollee Herter, Kendrick Fries R, DO  LORazepam (ATIVAN) 0.5 MG tablet Take 1 tablet (0.5 mg total) by mouth 2 (two) times daily as needed. 11/09/17   Ann Held, DO  metoprolol tartrate (LOPRESSOR) 25 MG tablet Take 1 tablet (25 mg total) by mouth 2 (two) times daily. 05/31/17   Ann Held, DO  oxybutynin (DITROPAN) 5 MG tablet Take 1 tablet (5 mg total) by mouth every morning. 11/09/17   Roma Schanz R, DO  Potassium Chloride ER 20 MEQ TBCR TAKE 1 TABLET BY MOUTH ONCE DAILY 10/12/17   Carollee Herter, Alferd Apa, DO  traMADol (ULTRAM) 50 MG tablet 1 po q6h prn 11/09/17   Ann Held, DO    Family History Family History  Problem Relation Age of Onset  . Cancer Sister        bladder  . Kidney cancer Father   .  Cancer Father        renal  . COPD Father   . Congestive Heart Failure Father   . Coronary artery disease Unknown   . Diabetes Unknown   . Hyperlipidemia Unknown   . Hypertension Unknown   . Rheumatologic disease Neg Hx     Social History Social History   Tobacco Use  . Smoking status: Former Research scientist (life sciences)  . Smokeless tobacco: Never Used  . Tobacco comment: never used tobacco  Substance Use Topics  . Alcohol use: No    Alcohol/week: 0.0 oz  . Drug use: No     Allergies   Cefuroxime axetil and Ciprofloxacin   Review of Systems Review of Systems  Constitutional: Negative for fever and unexpected weight change.  Respiratory: Positive for shortness of breath. Negative for hemoptysis.   Cardiovascular: Negative for syncope.  Gastrointestinal: Negative for abdominal pain.  Musculoskeletal: Positive for back pain.  All other systems reviewed and are negative.    Physical Exam Updated Vital Signs BP (!) 149/57 (BP Location: Right Arm)   Pulse 71   Temp (!) 97.3 F (36.3 C) (Oral)   Resp (!) 21   Ht 1.613 m (5' 3.5")   Wt 88.5 kg (195 lb)   LMP  (LMP Unknown)   SpO2 95%   BMI  34.00 kg/m   Physical Exam  CONSTITUTIONAL: Well developed/well nourished, no distress HEAD: Normocephalic/atraumatic EYES: EOMI/PERRL ENMT: Mucous membranes moist NECK: supple no meningeal signs SPINE/BACK:entire spine nontender CV: S1/S2 noted, no murmurs/rubs/gallops noted LUNGS: Crackles noted bilateral bases, no apparent distress ABDOMEN: soft, nontender, no rebound or guarding, bowel sounds noted throughout abdomen GU:no cva tenderness NEURO: Pt is awake/alert/appropriate, moves all extremitiesx4.  No facial droop.   EXTREMITIES: pulses normal/equal, full ROM, mild symmetric edema in the lower extremities SKIN: warm, color normal PSYCH: no abnormalities of mood noted, alert and oriented to situation  ED Treatments / Results  Labs (all labs ordered are listed, but only abnormal results are displayed) Labs Reviewed  CBG MONITORING, ED - Abnormal; Notable for the following components:      Result Value   Glucose-Capillary 177 (*)    All other components within normal limits  I-STAT TROPONIN, ED    EKG  EKG Interpretation  Date/Time:  Friday November 30 2017 23:46:38 EST Ventricular Rate:  73 PR Interval:    QRS Duration: 106 QT Interval:  417 QTC Calculation: 460 R Axis:   -28 Text Interpretation:  Sinus rhythm Borderline left axis deviation Borderline T abnormalities, inferior leads Baseline wander in lead(s) II III aVR aVL aVF V2 V3 V5 No significant change since last tracing Confirmed by Ripley Fraise [phone]) on 12/01/2017 12:11:30 AM       Radiology Ct Angio Chest Pe W And/or Wo Contrast  Result Date: 12/01/2017 CLINICAL DATA:  82 year old female with shortness of breath. Concern for PE. EXAM: CT ANGIOGRAPHY CHEST WITH CONTRAST TECHNIQUE: Multidetector CT imaging of the chest was performed using the standard protocol during bolus administration of intravenous contrast. Multiplanar CT image reconstructions and MIPs were obtained to evaluate the vascular  anatomy. CONTRAST:  115m ISOVUE-370 IOPAMIDOL (ISOVUE-370) INJECTION 76% COMPARISON:  Chest radiograph dated 12/01/2017 and chest CT dated 05/20/2017 FINDINGS: Cardiovascular: There is mild cardiomegaly. Multi vessel coronary vascular calcification. No pericardial effusion. Moderate atherosclerotic calcification of the thoracic aorta. Evaluation of the aorta is limited due to suboptimal enhancement and timing of the contrast. Evaluation of the pulmonary artery is is limited due to suboptimal opacification of the  peripheral branches. There is no CT evidence of acute pulmonary artery embolus. Low attenuating area along the right lower lobe pulmonary artery (series 7, image 143) as well as adjacent to the right upper lobe pulmonary artery (series 7, image 63) appear chronic and extravascular. Mediastinum/Nodes: Mildly prominent bilateral hilar and mediastinal lymph nodes. No mediastinal fluid collection or hematoma. Lungs/Pleura: Small bilateral pleural effusions. Bibasilar atelectasis/ scarring similar or slightly improved compared to the prior CT. Pneumonia is not entirely excluded. Bilateral hazy ground-glass density may represent atelectasis and less likely edema. Clinical correlation is recommended. There is a 5 mm right middle lobe pulmonary nodule (series 6, image 59). No pneumothorax. The central airways are patent. Upper Abdomen: Cirrhosis.  Bilateral adrenal nodularity. Musculoskeletal: Degenerative changes of the spine. Review of the MIP images confirms the above findings. IMPRESSION: 1. No CT evidence of acute pulmonary artery embolus. 2. Small bilateral pleural effusions and bibasilar atelectasis/scarring. Pneumonia is not excluded. Mild diffuse hazy airspace density may represent atelectasis and less likely edema. Clinical correlation is recommended. 3. A 5 mm right middle lobe pulmonary nodule. No follow-up needed if patient is low-risk. Non-contrast chest CT can be considered in 12 months if patient is  high-risk. This recommendation follows the consensus statement: Guidelines for Management of Incidental Pulmonary Nodules Detected on CT Images: From the Fleischner Society 2017; Radiology 2017; 284:228-243. 4. Cardiomegaly with coronary vascular calcification. 5.  Aortic Atherosclerosis (ICD10-I70.0). Electronically Signed   By: Anner Crete M.D.   On: 12/01/2017 04:04   Dg Chest Port 1 View  Result Date: 12/01/2017 CLINICAL DATA:  82 year old female with shortness of breath. EXAM: PORTABLE CHEST 1 VIEW COMPARISON:  Chest CT dated 05/20/2017 FINDINGS: Linear atelectasis/ scarring in the left mid lung field. Left lung base densities likely atelectatic changes. Pneumonia is less likely but not excluded. The right lung is clear. There is no pleural effusion or pneumothorax. Mildly enlarged cardiac silhouette. There is atherosclerotic calcification of the aortic arch. No acute osseous pathology. IMPRESSION: Left lung base atelectasis/ scarring. Pneumonia is less likely. Clinical correlation is recommended. Electronically Signed   By: Anner Crete M.D.   On: 12/01/2017 00:59    Procedures Procedures   Medications Ordered in ED Medications  ipratropium-albuterol (DUONEB) 0.5-2.5 (3) MG/3ML nebulizer solution 3 mL (not administered)  cefTRIAXone (ROCEPHIN) 1 g in dextrose 5 % 50 mL IVPB (not administered)  azithromycin (ZITHROMAX) tablet 500 mg (not administered)  traMADol (ULTRAM) tablet 50 mg (not administered)  iopamidol (ISOVUE-370) 76 % injection (100 mLs  Contrast Given 12/01/17 0334)     Initial Impression / Assessment and Plan / ED Course  I have reviewed the triage vital signs and the nursing notes.  Pertinent labs & imaging results that were available during my care of the patient were reviewed by me and considered in my medical decision making (see chart for details).     12:15 AM Patient sent to the ED for abnormal labs Full labs done yesterday, with elevated d-dimer and  elevated BNP CBC and BMP at baseline, and I reviewed those labs We will get an EKG, and chest x-ray, and will likely need CT angio chest 6:56 AM CT chest shows findings concerning for pneumonia, no PE On ambulation, patient felt dyspneic and her oxygen began to drop Admit her to the hospital for treatment of her pneumonia Final Clinical Impressions(s) / ED Diagnoses   Final diagnoses:  Community acquired pneumonia, unspecified laterality    ED Discharge Orders    None  Ripley Fraise, MD 12/01/17 [phone]

## 2017-12-01 NOTE — ED Notes (Signed)
Carb Modified diet breakfast tray ordered @ 0642 per EDP.

## 2017-12-01 NOTE — ED Notes (Signed)
Pt resting comfortably in bed; stated that she was SOB and ha difficulty breathing; patient currently on 2L of O2 via [place]. Pt states that she is doing much better after the "breathing treatment they gave me."

## 2017-12-01 NOTE — ED Notes (Signed)
Per Yvone Neu EMT Ambulated Pt in hallway  Pt stated she was a little dizzy Pt sats were 95% to start and dropped to 93% Pt walked witthout any problem.

## 2017-12-01 NOTE — ED Notes (Signed)
Attempted to call report

## 2017-12-01 NOTE — ED Notes (Signed)
Patient transported to CT

## 2017-12-01 NOTE — ED Notes (Signed)
CBG done after pt has eaten. Pt refuses insulin as she does not always take lunch dose and feels this will drop her too low. Statye she will get CBG prior to  Next meal and will takie evening dose.

## 2017-12-01 NOTE — Progress Notes (Signed)
RN notified Margaret Corpus, NP about patient's BP being 130/42 manually, inquired if Hydralazine and Metoprolol can be held.  RN awaiting response. P.J. Linus Mako, RN

## 2017-12-01 NOTE — H&P (Addendum)
History and Physical    Margaret Dyer FAO:[id number] DOB: 1935/06/18 DOA: 11/30/2017  Referring MD/NP/PA:  PCP: Ann Held, DO Outpatient Specialists:  Patient coming from: home  Chief Complaint: SOB  HPI: Margaret Dyer is a 82 y.o. female with medical history significant of asthma, DM, hypothyroidism.  She was seen by her PCP's office on 12/28 for evaluation of SOB.  She refused to be sent to the ER at that time.  She had labs drawn that included a d dimer which was abnormal.  She was called by her PCP's office and sent to the ER.  She has been living with her daughter for the past few months and has lost weight due to dietary changes.  She has been short of breath for since her discharge from the hospital in June.  Had work up then for SOB including evaluation by cardiology and pulmonary with RHC and echo.  Ultimately found to need diuresis.  Over the last 24 hours, her symptoms have worsened.  No fever, no chills.  She does reports some chest tightness and dizziness when she walks.    IN the ER, CTA was done that showed no PE but Small bilateral pleural effusions and bibasilar atelectasis/scarring. Pneumonia is not excluded. Mild diffuse hazy airspace density may represent atelectasis and less likely edema.  She was given IV abx and referred for hospital admission.  Nursing staff ambulated patient in the hallway and sats dropped from 95% to 93% with some dizziness.    Review of Systems: all systems reviewed, negative unless stated above in HPI   Past Medical History:  Diagnosis Date  . Asthma   . Blood transfusion   . Chronic diastolic CHF (congestive heart failure) ([place])   . CKD (chronic kidney disease), stage III ([place])   . Coronary artery calcification seen on CT scan   . Diabetes mellitus   . Goiter   . Hyperlipidemia   . Hypertension   . Hypothyroidism   . Liver cirrhosis ([place])   . NASH (nonalcoholic steatohepatitis)   . Obesity   . Osteopenia   . Pulmonary  hypertension ([place])   . Renal cell carcinoma    a. prior nephrectomy.  . Right heart failure ([place])   . Thrombocytopenia ([place])     Past Surgical History:  Procedure Laterality Date  . ABDOMINAL HYSTERECTOMY    . APPENDECTOMY    . HEMORRHOID SURGERY    . KNEE SURGERY    . NEPHRECTOMY  09.2000     reports that she has quit smoking. she has never used smokeless tobacco. She reports that she does not drink alcohol or use drugs.  Allergies  Allergen Reactions  . Cefuroxime Axetil     Upset stomach   . Ciprofloxacin     Upset stomach    Family History  Problem Relation Age of Onset  . Cancer Sister        bladder  . Kidney cancer Father   . Cancer Father        renal  . COPD Father   . Congestive Heart Failure Father   . Coronary artery disease Unknown   . Diabetes Unknown   . Hyperlipidemia Unknown   . Hypertension Unknown   . Rheumatologic disease Neg Hx      Prior to Admission medications   Medication Sig Start Date End Date Taking? Authorizing Provider  amLODipine (NORVASC) 10 MG tablet take 1 tablet by mouth every morning 09/27/17  Yes Penni Homans  A, MD  aspirin EC 81 MG tablet Take 1 tablet (81 mg total) by mouth daily. 06/05/17  Yes Dunn, Dayna N, PA-C  furosemide (LASIX) 40 MG tablet Take 1 tablet (40 mg total) by mouth 2 (two) times daily. 06/11/17  Yes Ann Held, DO  gabapentin (NEURONTIN) 100 MG capsule take 1 capsule by mouth twice a day Patient taking differently: take 100 mg by mouth twice a day as needed 11/07/17  Yes Roma Schanz R, DO  hydrALAZINE (APRESOLINE) 50 MG tablet Take 1 tablet (50 mg total) by mouth every 8 (eight) hours. 07/04/17  Yes Hilty, Nadean Corwin, MD  insulin glargine (LANTUS) 100 UNIT/ML injection Inject 50 Units into the skin every evening.    Yes [provider]  insulin glulisine (APIDRA) 100 UNIT/ML injection 40 u sq in am and 80 u sq q pm Patient taking differently: Inject 15 Units into the skin 3 (three)  times daily with meals.  02/19/15  Yes Ann Held, DO  levothyroxine (SYNTHROID, LEVOTHROID) 25 MCG tablet take 1 tablet by mouth daily 07/13/17  Yes Lowne Lyndal Pulley R, DO  LORazepam (ATIVAN) 0.5 MG tablet Take 1 tablet (0.5 mg total) by mouth 2 (two) times daily as needed. 11/09/17  Yes Roma Schanz R, DO  metoprolol tartrate (LOPRESSOR) 25 MG tablet Take 1 tablet (25 mg total) by mouth 2 (two) times daily. 05/31/17  Yes Roma Schanz R, DO  oxybutynin (DITROPAN) 5 MG tablet Take 1 tablet (5 mg total) by mouth every morning. 11/09/17  Yes Roma Schanz R, DO  Potassium Chloride ER 20 MEQ TBCR TAKE 1 TABLET BY MOUTH ONCE DAILY 10/12/17  Yes Roma Schanz R, DO  traMADol (ULTRAM) 50 MG tablet 1 po q6h prn Patient taking differently: Take 50 mg by mouth every 6 (six) hours as needed for moderate pain.  11/09/17  Yes Roma Schanz R, DO  albuterol (PROVENTIL HFA;VENTOLIN HFA) 108 (90 Base) MCG/ACT inhaler Inhale 2 puffs into the lungs every 6 (six) hours as needed for wheezing or shortness of breath. 11/30/17   Saguier, Percell Miller, PA-C  albuterol (PROVENTIL) (2.5 MG/3ML) 0.083% nebulizer solution Take 3 mLs (2.5 mg total) by nebulization every 6 (six) hours as needed for wheezing or shortness of breath. 11/30/17   Saguier, Percell Miller, PA-C  beclomethasone (QVAR REDIHALER) 40 MCG/ACT inhaler Inhale 2 puffs into the lungs 2 (two) times daily. 11/30/17   Saguier, Percell Miller, PA-C  ipratropium (ATROVENT HFA) 17 MCG/ACT inhaler Inhale 2 puffs into the lungs every 6 (six) hours as needed for wheezing. 11/30/17   Mackie Pai, PA-C    Physical Exam: Vitals:   12/01/17 0430 12/01/17 0500 12/01/17 0530 12/01/17 0730  BP: 134/61 (!) 151/61 (!) 145/64 (!) 164/76  Pulse: 74 64 68 62  Resp: _0 Temp:      TempSrc:      SpO2: 96% 97% 96% 99%  Weight:      Height:          Constitutional: NAD, calm, comfortable Vitals:   12/01/17 0430 12/01/17 0500 12/01/17 0530  12/01/17 0730  BP: 134/61 (!) 151/61 (!) 145/64 (!) 164/76  Pulse: 74 64 68 62  Resp: _1 Temp:      TempSrc:      SpO2: 96% 97% 96% 99%  Weight:      Height:       Eyes: PERRL, lids and conjunctivae normal ENMT: Mucous  membranes are moist. Posterior pharynx clear of any exudate or lesions.Normal dentition.  Neck: normal, supple, no masses, no thyromegaly Respiratory: crackles at bases, no wheezing Cardiovascular: Regular rate and rhythm, no murmurs / rubs / gallops. No extremity edema Abdomen: no tenderness, no masses palpated. No hepatosplenomegaly. Bowel sounds positive. obese Musculoskeletal: no clubbing / cyanosis. No joint deformity upper and lower extremities. Good ROM, no contractures. Normal muscle tone.  Skin: few areas of bruising Neurologic: CN 2-12 grossly intact. Sensation intact, DTR normal. Strength 5/5 in all 4.  Psychiatric: anxious appearing    Labs on Admission: I have personally reviewed following labs and imaging studies  CBC: Recent Labs  Lab 11/30/17 1559  WBC 8.6  NEUTROABS 6,072  HGB 14.1  HCT 41.4  MCV 87.3  PLT 213   Basic Metabolic Panel: Recent Labs  Lab 11/30/17 1559  NA 142  K 3.8  CL 102  CO2 30  GLUCOSE 135*  BUN 28*  CREATININE 1.06*  CALCIUM 9.4   GFR: Estimated Creatinine Clearance: 45.2 mL/min (A) (by C-G formula based on SCr of 1.06 mg/dL (H)). Liver Function Tests: Recent Labs  Lab 11/30/17 1559  AST 24  ALT 24  BILITOT 0.8  PROT 6.6   No results for input(s): LIPASE, AMYLASE in the last 168 hours. No results for input(s): AMMONIA in the last 168 hours. Coagulation Profile: No results for input(s): INR, PROTIME in the last 168 hours. Cardiac Enzymes: Recent Labs  Lab 11/30/17 1559  TROPONINI <0.01   BNP (last 3 results) No results for input(s): PROBNP in the last 8760 hours. HbA1C: No results for input(s): HGBA1C in the last 72 hours. CBG: Recent Labs  Lab 12/01/17 0352  GLUCAP 177*    Lipid Profile: No results for input(s): CHOL, HDL, LDLCALC, TRIG, CHOLHDL, LDLDIRECT in the last 72 hours. Thyroid Function Tests: No results for input(s): TSH, T4TOTAL, FREET4, T3FREE, THYROIDAB in the last 72 hours. Anemia Panel: No results for input(s): VITAMINB12, FOLATE, FERRITIN, TIBC, IRON, RETICCTPCT in the last 72 hours. Urine analysis:    Component Value Date/Time   COLORURINE YELLOW 05/17/2017 1212   APPEARANCEUR CLEAR 05/17/2017 1212   LABSPEC 1.010 05/17/2017 1212   PHURINE 5.0 05/17/2017 1212   GLUCOSEU NEGATIVE 05/17/2017 1212   HGBUR NEGATIVE 05/17/2017 1212   HGBUR large 02/17/2011 1432   BILIRUBINUR NEGATIVE 05/17/2017 1212   BILIRUBINUR Neg 02/19/2015 1512   KETONESUR NEGATIVE 05/17/2017 1212   PROTEINUR 100 (A) 05/17/2017 1212   UROBILINOGEN 2.0 02/19/2015 1512   UROBILINOGEN 0.2 02/17/2011 1432   NITRITE NEGATIVE 05/17/2017 1212   LEUKOCYTESUR NEGATIVE 05/17/2017 1212   Sepsis Labs: Invalid input(s): PROCALCITONIN, LACTICIDVEN No results found for this or any previous visit (from the past 240 hour(s)).   Radiological Exams on Admission: Ct Angio Chest Pe W And/or Wo Contrast  Result Date: 12/01/2017 CLINICAL DATA:  82 year old female with shortness of breath. Concern for PE. EXAM: CT ANGIOGRAPHY CHEST WITH CONTRAST TECHNIQUE: Multidetector CT imaging of the chest was performed using the standard protocol during bolus administration of intravenous contrast. Multiplanar CT image reconstructions and MIPs were obtained to evaluate the vascular anatomy. CONTRAST:  160m ISOVUE-370 IOPAMIDOL (ISOVUE-370) INJECTION 76% COMPARISON:  Chest radiograph dated 12/01/2017 and chest CT dated 05/20/2017 FINDINGS: Cardiovascular: There is mild cardiomegaly. Multi vessel coronary vascular calcification. No pericardial effusion. Moderate atherosclerotic calcification of the thoracic aorta. Evaluation of the aorta is limited due to suboptimal enhancement and timing of the  contrast. Evaluation of the pulmonary artery is  is limited due to suboptimal opacification of the peripheral branches. There is no CT evidence of acute pulmonary artery embolus. Low attenuating area along the right lower lobe pulmonary artery (series 7, image 143) as well as adjacent to the right upper lobe pulmonary artery (series 7, image 63) appear chronic and extravascular. Mediastinum/Nodes: Mildly prominent bilateral hilar and mediastinal lymph nodes. No mediastinal fluid collection or hematoma. Lungs/Pleura: Small bilateral pleural effusions. Bibasilar atelectasis/ scarring similar or slightly improved compared to the prior CT. Pneumonia is not entirely excluded. Bilateral hazy ground-glass density may represent atelectasis and less likely edema. Clinical correlation is recommended. There is a 5 mm right middle lobe pulmonary nodule (series 6, image 59). No pneumothorax. The central airways are patent. Upper Abdomen: Cirrhosis.  Bilateral adrenal nodularity. Musculoskeletal: Degenerative changes of the spine. Review of the MIP images confirms the above findings. IMPRESSION: 1. No CT evidence of acute pulmonary artery embolus. 2. Small bilateral pleural effusions and bibasilar atelectasis/scarring. Pneumonia is not excluded. Mild diffuse hazy airspace density may represent atelectasis and less likely edema. Clinical correlation is recommended. 3. A 5 mm right middle lobe pulmonary nodule. No follow-up needed if patient is low-risk. Non-contrast chest CT can be considered in 12 months if patient is high-risk. This recommendation follows the consensus statement: Guidelines for Management of Incidental Pulmonary Nodules Detected on CT Images: From the Fleischner Society 2017; Radiology 2017; 284:228-243. 4. Cardiomegaly with coronary vascular calcification. 5.  Aortic Atherosclerosis (ICD10-I70.0). Electronically Signed   By: Anner Crete M.D.   On: 12/01/2017 04:04   Dg Chest Port 1 View  Result Date:  12/01/2017 CLINICAL DATA:  82 year old female with shortness of breath. EXAM: PORTABLE CHEST 1 VIEW COMPARISON:  Chest CT dated 05/20/2017 FINDINGS: Linear atelectasis/ scarring in the left mid lung field. Left lung base densities likely atelectatic changes. Pneumonia is less likely but not excluded. The right lung is clear. There is no pleural effusion or pneumothorax. Mildly enlarged cardiac silhouette. There is atherosclerotic calcification of the aortic arch. No acute osseous pathology. IMPRESSION: Left lung base atelectasis/ scarring. Pneumonia is less likely. Clinical correlation is recommended. Electronically Signed   By: Anner Crete M.D.   On: 12/01/2017 00:59    EKG: Independently reviewed. NSR  Assessment/Plan Active Problems:   Essential hypertension   Anxiety and depression   Hypertension   Diabetes mellitus type 2, insulin dependent (HCC)   Hypothyroidism   Chronic diastolic CHF (congestive heart failure) ([facility])   Pneumonia   Dyspnea due to PNA (doubt) vs acute on chronic diastolic CHF +/- deconditioning/anxiety -BNP elevated but not as high as prior admission -last echo (6/18) during admission for SOB that showed grade 2 diastolic dsfxn-- also had RHC during that admission: elevated PCWP of 32 mmHg and large V waves (but no significant MR), elevated TPG  -has lost weight in last several months due to dietary changes but does not weigh self daily -procalcitonin pending -given abx in ER before blood cultures drawn -hold IVF-- may need IV lasix-- resume home lasix for now -not active at home much-- incentive spirometry -cycle CE-- had low risk stress test in July  HTN -resume home meds  DM -SSI -lantus  CKD stage III Estimated Creatinine Clearance: 45.2 mL/min (A) (by C-G formula based on SCr of 1.06 mg/dL (H)).   Hypothyroid -synthroid continue  incidental RML nodule -outpatient follow up -remote smoking history  Anxiety -husband died in 03-14-17 -resume home meds   DVT prophylaxis: lovenox Code Status:  full Family Communication:  Disposition Plan:  Consults called:  Admission status: obs   Geradine Girt DO [facility] Pager [phone]  If 7PM-7AM, please contact night-coverage [email or web address] Password [email or web address]  12/01/2017, 8:02 AM

## 2017-12-02 DIAGNOSIS — Z888 Allergy status to other drugs, medicaments and biological substances status: Secondary | ICD-10-CM | POA: Diagnosis not present

## 2017-12-02 DIAGNOSIS — E119 Type 2 diabetes mellitus without complications: Secondary | ICD-10-CM | POA: Diagnosis not present

## 2017-12-02 DIAGNOSIS — R911 Solitary pulmonary nodule: Secondary | ICD-10-CM | POA: Diagnosis present

## 2017-12-02 DIAGNOSIS — J189 Pneumonia, unspecified organism: Secondary | ICD-10-CM | POA: Diagnosis not present

## 2017-12-02 DIAGNOSIS — Z79899 Other long term (current) drug therapy: Secondary | ICD-10-CM | POA: Diagnosis not present

## 2017-12-02 DIAGNOSIS — Z905 Acquired absence of kidney: Secondary | ICD-10-CM | POA: Diagnosis not present

## 2017-12-02 DIAGNOSIS — I2510 Atherosclerotic heart disease of native coronary artery without angina pectoris: Secondary | ICD-10-CM | POA: Diagnosis present

## 2017-12-02 DIAGNOSIS — I5032 Chronic diastolic (congestive) heart failure: Secondary | ICD-10-CM | POA: Diagnosis not present

## 2017-12-02 DIAGNOSIS — J90 Pleural effusion, not elsewhere classified: Secondary | ICD-10-CM | POA: Diagnosis not present

## 2017-12-02 DIAGNOSIS — N183 Chronic kidney disease, stage 3 (moderate): Secondary | ICD-10-CM | POA: Diagnosis present

## 2017-12-02 DIAGNOSIS — I5033 Acute on chronic diastolic (congestive) heart failure: Secondary | ICD-10-CM | POA: Diagnosis present

## 2017-12-02 DIAGNOSIS — I10 Essential (primary) hypertension: Secondary | ICD-10-CM

## 2017-12-02 DIAGNOSIS — Z794 Long term (current) use of insulin: Secondary | ICD-10-CM | POA: Diagnosis not present

## 2017-12-02 DIAGNOSIS — I130 Hypertensive heart and chronic kidney disease with heart failure and stage 1 through stage 4 chronic kidney disease, or unspecified chronic kidney disease: Secondary | ICD-10-CM | POA: Diagnosis present

## 2017-12-02 DIAGNOSIS — Z87891 Personal history of nicotine dependence: Secondary | ICD-10-CM | POA: Diagnosis not present

## 2017-12-02 DIAGNOSIS — F419 Anxiety disorder, unspecified: Secondary | ICD-10-CM | POA: Diagnosis not present

## 2017-12-02 DIAGNOSIS — Z85528 Personal history of other malignant neoplasm of kidney: Secondary | ICD-10-CM | POA: Diagnosis not present

## 2017-12-02 DIAGNOSIS — Z881 Allergy status to other antibiotic agents status: Secondary | ICD-10-CM | POA: Diagnosis not present

## 2017-12-02 DIAGNOSIS — Z8249 Family history of ischemic heart disease and other diseases of the circulatory system: Secondary | ICD-10-CM | POA: Diagnosis not present

## 2017-12-02 DIAGNOSIS — E039 Hypothyroidism, unspecified: Secondary | ICD-10-CM | POA: Diagnosis present

## 2017-12-02 DIAGNOSIS — E1122 Type 2 diabetes mellitus with diabetic chronic kidney disease: Secondary | ICD-10-CM | POA: Diagnosis present

## 2017-12-02 DIAGNOSIS — I700 Atherosclerosis of aorta: Secondary | ICD-10-CM | POA: Diagnosis present

## 2017-12-02 DIAGNOSIS — F329 Major depressive disorder, single episode, unspecified: Secondary | ICD-10-CM

## 2017-12-02 DIAGNOSIS — J45909 Unspecified asthma, uncomplicated: Secondary | ICD-10-CM | POA: Diagnosis present

## 2017-12-02 DIAGNOSIS — E785 Hyperlipidemia, unspecified: Secondary | ICD-10-CM | POA: Diagnosis present

## 2017-12-02 DIAGNOSIS — M8580 Other specified disorders of bone density and structure, unspecified site: Secondary | ICD-10-CM | POA: Diagnosis present

## 2017-12-02 DIAGNOSIS — E669 Obesity, unspecified: Secondary | ICD-10-CM | POA: Diagnosis present

## 2017-12-02 LAB — HIV ANTIBODY (ROUTINE TESTING W REFLEX): HIV SCREEN 4TH GENERATION: NONREACTIVE

## 2017-12-02 LAB — CBC
HEMATOCRIT: 39.7 % (ref 36.0–46.0)
HEMOGLOBIN: 12.7 g/dL (ref 12.0–15.0)
MCH: 29.5 pg (ref 26.0–34.0)
MCHC: 32 g/dL (ref 30.0–36.0)
MCV: 92.1 fL (ref 78.0–100.0)
Platelets: 117 10*3/uL — ABNORMAL LOW (ref 150–400)
RBC: 4.31 MIL/uL (ref 3.87–5.11)
RDW: 14.3 % (ref 11.5–15.5)
WBC: 6.8 10*3/uL (ref 4.0–10.5)

## 2017-12-02 LAB — BASIC METABOLIC PANEL
ANION GAP: 9 (ref 5–15)
BUN: 29 mg/dL — ABNORMAL HIGH (ref 6–20)
CALCIUM: 8.7 mg/dL — AB (ref 8.9–10.3)
CO2: 28 mmol/L (ref 22–32)
Chloride: 100 mmol/L — ABNORMAL LOW (ref 101–111)
Creatinine, Ser: 1.14 mg/dL — ABNORMAL HIGH (ref 0.44–1.00)
GFR, EST AFRICAN AMERICAN: 51 mL/min — AB (ref >60)
GFR, EST NON AFRICAN AMERICAN: 44 mL/min — AB (ref >60)
GLUCOSE: 155 mg/dL — AB (ref 65–99)
POTASSIUM: 3.8 mmol/L (ref 3.5–5.1)
SODIUM: 137 mmol/L (ref 135–145)

## 2017-12-02 LAB — GLUCOSE, CAPILLARY
GLUCOSE-CAPILLARY: 147 mg/dL — AB (ref 65–99)
GLUCOSE-CAPILLARY: 109 mg/dL — AB (ref 65–99)
GLUCOSE-CAPILLARY: 138 mg/dL — AB (ref 65–99)
GLUCOSE-CAPILLARY: 110 mg/dL — AB (ref 65–99)
Glucose-Capillary: 174 mg/dL — ABNORMAL HIGH (ref 65–99)

## 2017-12-02 LAB — PROCALCITONIN

## 2017-12-02 MED ORDER — HYDRALAZINE HCL 10 MG PO TABS
10.0000 mg | ORAL_TABLET | Freq: Three times a day (TID) | ORAL | Status: DC
  Administered 2017-12-02 – 2017-12-03 (×2): 10 mg via ORAL
  Filled 2017-12-02 (×2): qty 1

## 2017-12-02 MED ORDER — FUROSEMIDE 10 MG/ML IJ SOLN
40.0000 mg | Freq: Two times a day (BID) | INTRAMUSCULAR | Status: DC
  Administered 2017-12-02 – 2017-12-03 (×3): 40 mg via INTRAVENOUS
  Filled 2017-12-02 (×4): qty 4

## 2017-12-02 MED ORDER — FUROSEMIDE 10 MG/ML IJ SOLN: 40.0000 mg | Freq: Two times a day (BID) | INTRAMUSCULAR | Status: DC

## 2017-12-02 MED ORDER — POTASSIUM CHLORIDE CRYS ER 20 MEQ PO TBCR
40.0000 meq | EXTENDED_RELEASE_TABLET | Freq: Two times a day (BID) | ORAL | Status: DC
  Administered 2017-12-02 – 2017-12-05 (×6): 40 meq via ORAL
  Filled 2017-12-02 (×6): qty 2

## 2017-12-02 NOTE — Progress Notes (Signed)
PROGRESS NOTE    Margaret Dyer  SNK:[id number] DOB: 05/07/1935 DOA: 11/30/2017 PCP: Ann Held, DO  Brief Narrative: Margaret Dyer is a 82 y.o. female with medical history significant of asthma, diastolic CHF, DM, hypothyroidism.  She was seen by her PCP's office on 12/28 for evaluation of SOB.   She has been short of breath for since her discharge from the hospital in June.  Had work up then for SOB including evaluation by cardiology and pulmonary with RHC and echo.  Ultimately found to have diastolic chd needing diuresis but admits poor compliance with Lasix due to polyuria. CTA chest noted no PE and small pleural effusions and bibasilar atelectasis versus scarring, pneumonia not excluded, she was started on IV antibiotics on admission   Assessment & Plan:   1. Acute on chronic diastolic CHF -She is clinically volume overloaded with bilateral pleural effusions -Last echo 6/18 with normal EF and grade 2 diastolic dysfunction -doubt pneumonia in the absence of productive cough fever or leukocytosis, will check pro-calcitonin level if low stop antibiotics and monitor -Start IV Lasix with potassium today -Monitor I/O, daily weights -Compliance with medications and low-salt diet emphasized -Repeat chest x-ray tomorrow  2. Possible pneumonia -Clinically doubt this, see above  3. Diabetes mellitus type 2 -Continue Lantus, sliding scale insulin  4. CK D stage III -Stable, monitor with diuresis  5. Essential hypertension -Cut down hydralazine and hold amlodipine to allow more room for diuresis  6. Right middle lobe lung nodule incidentally noted on CT -Needs follow-up, remote smoking history  7. Hypothyroidism, continue Synthroid  8.  Anxiety and depression  -Continue home medications   DVT prophylaxis: Lovenox  Code Status:  full code  Family Communication: sister at bedside  Disposition Plan: Home in 48-72 hours   Consultants:   None    Procedures:    Antimicrobials:  Antibiotics Given (last 72 hours)    Date/Time Action Medication Dose Rate   12/01/17 0722 Given   azithromycin (ZITHROMAX) tablet 500 mg 500 mg    12/01/17 0723 New Bag/Given   cefTRIAXone (ROCEPHIN) 1 g in dextrose 5 % 50 mL IVPB 1 g 100 mL/hr   12/02/17 0845 New Bag/Given   cefTRIAXone (ROCEPHIN) 1 g in dextrose 5 % 50 mL IVPB 1 g 100 mL/hr   12/02/17 0937 New Bag/Given   azithromycin (ZITHROMAX) 500 mg in dextrose 5 % 250 mL IVPB 500 mg 250 mL/hr      Subjective: -Still feels short of breath with minimal activity on exertion, mild nonproductive dry cough reported   Objective: Vitals:   12/02/17 0500 12/02/17 0518 12/02/17 0630 12/02/17 0842  BP:  (!) 143/58 (!) 110/48 (!) 157/57  Pulse:  (!) 56  65  Resp:  14    Temp:  (!) 97.5 F (36.4 C)    TempSrc:  Oral    SpO2:  97%    Weight: 89.1 kg (196 lb 6.9 oz)     Height:        Intake/Output Summary (Last 24 hours) at 12/02/2017 1144 Last data filed at 12/02/2017 0900 Gross per 24 hour  Intake 660 ml  Output 350 ml  Net 310 ml   Filed Weights   11/30/17 2351 12/02/17 0500  Weight: 88.5 kg (195 lb) 89.1 kg (196 lb 6.9 oz)    Examination:  General exam: Appears calm and comfortable  Respiratory system: Fine crackles in middle lungs noted, and diminished at both bases CVS: S1 & S2  heard, RRR. No JVD, murmurs, rubs, gallops  Gastrointestinal system: Abdomen is nondistended, soft and nontender.Normal bowel sounds heard. Central nervous system: Alert and oriented. No focal neurological deficits. Extremities1+ edema Skin: No rashes, lesions or ulcers Psychiatry: Judgement and insight appear normal. Mood & affect appropriate.     Data Reviewed:   CBC: Recent Labs  Lab 11/30/17 1559 12/02/17 0327  WBC 8.6 6.8  NEUTROABS 6,072  --   HGB 14.1 12.7  HCT 41.4 39.7  MCV 87.3 92.1  PLT 153 621*   Basic Metabolic Panel: Recent Labs  Lab 11/30/17 1559 12/02/17 0327  NA 142 137  K 3.8 3.8   CL 102 100*  CO2 30 28  GLUCOSE 135* 155*  BUN 28* 29*  CREATININE 1.06* 1.14*  CALCIUM 9.4 8.7*   GFR: Estimated Creatinine Clearance: 42.1 mL/min (A) (by C-G formula based on SCr of 1.14 mg/dL (H)). Liver Function Tests: Recent Labs  Lab 11/30/17 1559  AST 24  ALT 24  BILITOT 0.8  PROT 6.6   No results for input(s): LIPASE, AMYLASE in the last 168 hours. No results for input(s): AMMONIA in the last 168 hours. Coagulation Profile: No results for input(s): INR, PROTIME in the last 168 hours. Cardiac Enzymes: Recent Labs  Lab 11/30/17 1559 12/01/17 0845 12/01/17 1850  TROPONINI <0.01 <0.03 <0.03   BNP (last 3 results) No results for input(s): PROBNP in the last 8760 hours. HbA1C: No results for input(s): HGBA1C in the last 72 hours. CBG: Recent Labs  Lab 12/01/17 1426 12/01/17 1610 12/01/17 1821 12/01/17 2307 12/02/17 0838  GLUCAP 200* 182* 154* 147* 109*   Lipid Profile: No results for input(s): CHOL, HDL, LDLCALC, TRIG, CHOLHDL, LDLDIRECT in the last 72 hours. Thyroid Function Tests: No results for input(s): TSH, T4TOTAL, FREET4, T3FREE, THYROIDAB in the last 72 hours. Anemia Panel: No results for input(s): VITAMINB12, FOLATE, FERRITIN, TIBC, IRON, RETICCTPCT in the last 72 hours. Urine analysis:    Component Value Date/Time   COLORURINE YELLOW 05/17/2017 1212   APPEARANCEUR CLEAR 05/17/2017 1212   LABSPEC 1.010 05/17/2017 1212   PHURINE 5.0 05/17/2017 1212   GLUCOSEU NEGATIVE 05/17/2017 1212   HGBUR NEGATIVE 05/17/2017 1212   HGBUR large 02/17/2011 1432   BILIRUBINUR NEGATIVE 05/17/2017 1212   BILIRUBINUR Neg 02/19/2015 1512   KETONESUR NEGATIVE 05/17/2017 1212   PROTEINUR 100 (A) 05/17/2017 1212   UROBILINOGEN 2.0 02/19/2015 1512   UROBILINOGEN 0.2 02/17/2011 1432   NITRITE NEGATIVE 05/17/2017 1212   LEUKOCYTESUR NEGATIVE 05/17/2017 1212   Sepsis Labs: _0 (procalcitonin:4,lacticidven:4)  )No results found for this or any previous  visit (from the past 240 hour(s)).       Radiology Studies: Ct Angio Chest Pe W And/or Wo Contrast  Result Date: 12/01/2017 CLINICAL DATA:  82 year old female with shortness of breath. Concern for PE. EXAM: CT ANGIOGRAPHY CHEST WITH CONTRAST TECHNIQUE: Multidetector CT imaging of the chest was performed using the standard protocol during bolus administration of intravenous contrast. Multiplanar CT image reconstructions and MIPs were obtained to evaluate the vascular anatomy. CONTRAST:  181m ISOVUE-370 IOPAMIDOL (ISOVUE-370) INJECTION 76% COMPARISON:  Chest radiograph dated 12/01/2017 and chest CT dated 05/20/2017 FINDINGS: Cardiovascular: There is mild cardiomegaly. Multi vessel coronary vascular calcification. No pericardial effusion. Moderate atherosclerotic calcification of the thoracic aorta. Evaluation of the aorta is limited due to suboptimal enhancement and timing of the contrast. Evaluation of the pulmonary artery is is limited due to suboptimal opacification of the peripheral branches. There is no CT evidence of acute pulmonary  artery embolus. Low attenuating area along the right lower lobe pulmonary artery (series 7, image 143) as well as adjacent to the right upper lobe pulmonary artery (series 7, image 63) appear chronic and extravascular. Mediastinum/Nodes: Mildly prominent bilateral hilar and mediastinal lymph nodes. No mediastinal fluid collection or hematoma. Lungs/Pleura: Small bilateral pleural effusions. Bibasilar atelectasis/ scarring similar or slightly improved compared to the prior CT. Pneumonia is not entirely excluded. Bilateral hazy ground-glass density may represent atelectasis and less likely edema. Clinical correlation is recommended. There is a 5 mm right middle lobe pulmonary nodule (series 6, image 59). No pneumothorax. The central airways are patent. Upper Abdomen: Cirrhosis.  Bilateral adrenal nodularity. Musculoskeletal: Degenerative changes of the spine. Review of the  MIP images confirms the above findings. IMPRESSION: 1. No CT evidence of acute pulmonary artery embolus. 2. Small bilateral pleural effusions and bibasilar atelectasis/scarring. Pneumonia is not excluded. Mild diffuse hazy airspace density may represent atelectasis and less likely edema. Clinical correlation is recommended. 3. A 5 mm right middle lobe pulmonary nodule. No follow-up needed if patient is low-risk. Non-contrast chest CT can be considered in 12 months if patient is high-risk. This recommendation follows the consensus statement: Guidelines for Management of Incidental Pulmonary Nodules Detected on CT Images: From the Fleischner Society 2017; Radiology 2017; 284:228-243. 4. Cardiomegaly with coronary vascular calcification. 5.  Aortic Atherosclerosis (ICD10-I70.0). Electronically Signed   By: Anner Crete M.D.   On: 12/01/2017 04:04   Dg Chest Port 1 View  Result Date: 12/01/2017 CLINICAL DATA:  82 year old female with shortness of breath. EXAM: PORTABLE CHEST 1 VIEW COMPARISON:  Chest CT dated 05/20/2017 FINDINGS: Linear atelectasis/ scarring in the left mid lung field. Left lung base densities likely atelectatic changes. Pneumonia is less likely but not excluded. The right lung is clear. There is no pleural effusion or pneumothorax. Mildly enlarged cardiac silhouette. There is atherosclerotic calcification of the aortic arch. No acute osseous pathology. IMPRESSION: Left lung base atelectasis/ scarring. Pneumonia is less likely. Clinical correlation is recommended. Electronically Signed   By: Anner Crete M.D.   On: 12/01/2017 00:59        Scheduled Meds: . aspirin EC  81 mg Oral Daily  . enoxaparin (LOVENOX) injection  40 mg Subcutaneous Q24H  . feeding supplement (ENSURE ENLIVE)  237 mL Oral BID BM  . furosemide  40 mg Intravenous Q12H  . gabapentin  100 mg Oral BID  . hydrALAZINE  10 mg Oral Q8H  . insulin aspart  15 Units Subcutaneous TID WC  . insulin glargine  45 Units  Subcutaneous QHS  . levothyroxine  25 mcg Oral QAC breakfast  . metoprolol tartrate  25 mg Oral BID  . oxybutynin  5 mg Oral Daily  . potassium chloride SA  40 mEq Oral BID   Continuous Infusions: . azithromycin Stopped (12/02/17 1037)  . cefTRIAXone (ROCEPHIN)  IV Stopped (12/02/17 0940)     LOS: 0 days    Time spent: 71mn    PDomenic Polite MD [facility] Page via [email or web address], password [email or web address] After 7PM please contact night-coverage  12/02/2017, 11:44 AM

## 2017-12-03 ENCOUNTER — Inpatient Hospital Stay (HOSPITAL_COMMUNITY): Payer: Medicare Other

## 2017-12-03 ENCOUNTER — Telehealth: Payer: Self-pay | Admitting: *Deleted

## 2017-12-03 LAB — GLUCOSE, CAPILLARY
GLUCOSE-CAPILLARY: 125 mg/dL — AB (ref 65–99)
GLUCOSE-CAPILLARY: 110 mg/dL — AB (ref 65–99)
Glucose-Capillary: 142 mg/dL — ABNORMAL HIGH (ref 65–99)
Glucose-Capillary: 169 mg/dL — ABNORMAL HIGH (ref 65–99)

## 2017-12-03 LAB — BASIC METABOLIC PANEL
Anion gap: 7 (ref 5–15)
BUN: 29 mg/dL — ABNORMAL HIGH (ref 6–20)
CALCIUM: 8.7 mg/dL — AB (ref 8.9–10.3)
CO2: 29 mmol/L (ref 22–32)
CREATININE: 1.14 mg/dL — AB (ref 0.44–1.00)
Chloride: 99 mmol/L — ABNORMAL LOW (ref 101–111)
GFR calc Af Amer: 51 mL/min — ABNORMAL LOW (ref >60)
GFR calc non Af Amer: 44 mL/min — ABNORMAL LOW (ref >60)
GLUCOSE: 172 mg/dL — AB (ref 65–99)
Potassium: 4.1 mmol/L (ref 3.5–5.1)
Sodium: 135 mmol/L (ref 135–145)

## 2017-12-03 LAB — CBC
HEMATOCRIT: 39.8 % (ref 36.0–46.0)
Hemoglobin: 12.8 g/dL (ref 12.0–15.0)
MCH: 29.4 pg (ref 26.0–34.0)
MCHC: 32.2 g/dL (ref 30.0–36.0)
MCV: 91.3 fL (ref 78.0–100.0)
Platelets: 116 10*3/uL — ABNORMAL LOW (ref 150–400)
RBC: 4.36 MIL/uL (ref 3.87–5.11)
RDW: 14.1 % (ref 11.5–15.5)
WBC: 8.2 10*3/uL (ref 4.0–10.5)

## 2017-12-03 MED ORDER — WHITE PETROLATUM EX OINT
TOPICAL_OINTMENT | CUTANEOUS | Status: AC
Start: 2017-12-03 — End: 2017-12-03
  Administered 2017-12-03: 19:00:00
  Filled 2017-12-03: qty 28.35

## 2017-12-03 NOTE — Progress Notes (Signed)
PROGRESS NOTE    Margaret Dyer  MQT:[id number] DOB: 07-18-1935 DOA: 11/30/2017 PCP: Ann Held, DO  Brief Narrative: Margaret Dyer a 82 y.o.femalewith medical history significant ofasthma, diastolic CHF, DM, hypothyroidism. She was seen by her PCP's office on 12/28 for evaluation of SOB. She has been short of breath for since her discharge from the hospital in June. Had work up then for SOB including evaluation by cardiology and pulmonary with RHC and echo. Ultimately found to have diastolic chd needing diuresis but admits poor compliance with Lasix due to polyuria. CTA chest noted no PE and small pleural effusions and bibasilar atelectasis versus scarring, pneumonia not excluded, she was started on IV antibiotics on admission   Assessment & Plan:   1. Acute on chronic diastolic CHF -She is clinically volume overloaded with bilateral pleural effusions -Last echo 6/18 with normal EF and grade 2 diastolic dysfunction -doubt pneumonia in the absence of productive cough fever or leukocytosis,  Low pro calcitonin and repeat x-ray without evidence of pneumonia  -Stop antibiotics -Continue IV Lasix and potassium today unfortunately I/O has not been recorded for over 24 hours, monitor daily weights -Compliance with medications and low-salt diet emphasized  2. Diabetes mellitus type 2 -Continue Lantus, sliding scale insulin -Stable  4. CK D stage III -Stable, monitor with diuresis  5. Essential hypertension Stop hydralazine and hold amlodipine to allow more room for diuresis  6. Right middle lobe lung nodule incidentally noted on CT -Needs follow-up, remote smoking history  7. Hypothyroidism, continue Synthroid  8.  Anxiety and depression  -Continue home medications   DVT prophylaxis: Lovenox  Code Status:  full code  Family Communication: sister at bedside  Disposition Plan: Home in 48-72 hours    Procedures:   Antimicrobials:     Subjective: -Breathing improving, urinating a lot  Objective: Vitals:   12/02/17 2115 12/02/17 2119 12/03/17 0500 12/03/17 0639  BP:  (!) 144/59  137/70  Pulse:  74  62  Resp:  18    Temp:  98.4 F (36.9 C)  97.8 F (36.6 C)  TempSrc:  Oral  Oral  SpO2: 94% 94%    Weight:   90.1 kg (198 lb 10.2 oz)   Height:        Intake/Output Summary (Last 24 hours) at 12/03/2017 1232 Last data filed at 12/03/2017 1128 Gross per 24 hour  Intake 300 ml  Output 1100 ml  Net -800 ml   Filed Weights   11/30/17 2351 12/02/17 0500 12/03/17 0500  Weight: 88.5 kg (195 lb) 89.1 kg (196 lb 6.9 oz) 90.1 kg (198 lb 10.2 oz)    Examination:  General exam: Appears calm and comfortable, no distress, AAO 3 Respiratory system: . crackles in both lower lungs  Cardiovascular system: S1 & S2 heard, RRR. No JVD, murmurs, rubs, gallops Gastrointestinal system: Abdomen is nondistended, soft and nontender.Normal bowel sounds heard. Central nervous system: Alert and oriented. No focal neurological deficits. Extremities: 1plus edema Skin: No rashes, lesions or ulcers Psychiatry:  andJudgement and insight appear normal. Mood & affect appropriate.     Data Reviewed:   CBC: Recent Labs  Lab 11/30/17 1559 12/02/17 0327 12/03/17 0213  WBC 8.6 6.8 8.2  NEUTROABS 6,072  --   --   HGB 14.1 12.7 12.8  HCT 41.4 39.7 39.8  MCV 87.3 92.1 91.3  PLT 153 117* 003*   Basic Metabolic Panel: Recent Labs  Lab 11/30/17 1559 12/02/17 0327 12/03/17 0213  NA 142  137 135  K 3.8 3.8 4.1  CL 102 100* 99*  CO2 _0 GLUCOSE 135* 155* 172*  BUN 28* 29* 29*  CREATININE 1.06* 1.14* 1.14*  CALCIUM 9.4 8.7* 8.7*   GFR: Estimated Creatinine Clearance: 42.4 mL/min (A) (by C-G formula based on SCr of 1.14 mg/dL (H)). Liver Function Tests: Recent Labs  Lab 11/30/17 1559  AST 24  ALT 24  BILITOT 0.8  PROT 6.6   No results for input(s): LIPASE, AMYLASE in the last 168 hours. No results for input(s):  AMMONIA in the last 168 hours. Coagulation Profile: No results for input(s): INR, PROTIME in the last 168 hours. Cardiac Enzymes: Recent Labs  Lab 11/30/17 1559 12/01/17 0845 12/01/17 1850  TROPONINI <0.01 <0.03 <0.03   BNP (last 3 results) No results for input(s): PROBNP in the last 8760 hours. HbA1C: No results for input(s): HGBA1C in the last 72 hours. CBG: Recent Labs  Lab 12/02/17 1214 12/02/17 1700 12/02/17 2116 12/03/17 0837 12/03/17 1204  GLUCAP 174* 138* 110* 142* 169*   Lipid Profile: No results for input(s): CHOL, HDL, LDLCALC, TRIG, CHOLHDL, LDLDIRECT in the last 72 hours. Thyroid Function Tests: No results for input(s): TSH, T4TOTAL, FREET4, T3FREE, THYROIDAB in the last 72 hours. Anemia Panel: No results for input(s): VITAMINB12, FOLATE, FERRITIN, TIBC, IRON, RETICCTPCT in the last 72 hours. Urine analysis:    Component Value Date/Time   COLORURINE YELLOW 05/17/2017 1212   APPEARANCEUR CLEAR 05/17/2017 1212   LABSPEC 1.010 05/17/2017 1212   PHURINE 5.0 05/17/2017 1212   GLUCOSEU NEGATIVE 05/17/2017 1212   HGBUR NEGATIVE 05/17/2017 1212   HGBUR large 02/17/2011 1432   BILIRUBINUR NEGATIVE 05/17/2017 1212   BILIRUBINUR Neg 02/19/2015 1512   KETONESUR NEGATIVE 05/17/2017 1212   PROTEINUR 100 (A) 05/17/2017 1212   UROBILINOGEN 2.0 02/19/2015 1512   UROBILINOGEN 0.2 02/17/2011 1432   NITRITE NEGATIVE 05/17/2017 1212   LEUKOCYTESUR NEGATIVE 05/17/2017 1212   Sepsis Labs: _1 (procalcitonin:4,lacticidven:4)  ) Recent Results (from the past 240 hour(s))  Culture, blood (routine x 2) Call MD if unable to obtain prior to antibiotics being given     Status: None (Preliminary result)   Collection Time: 12/01/17  7:00 PM  Result Value Ref Range Status   Specimen Description BLOOD RIGHT ANTECUBITAL  Final   Special Requests   Final    BOTTLES DRAWN AEROBIC AND ANAEROBIC Blood Culture adequate volume   Culture NO GROWTH 2 DAYS  Final   Report Status  PENDING  Incomplete  Culture, blood (routine x 2) Call MD if unable to obtain prior to antibiotics being given     Status: None (Preliminary result)   Collection Time: 12/01/17  7:05 PM  Result Value Ref Range Status   Specimen Description BLOOD LEFT ANTECUBITAL  Final   Special Requests   Final    BOTTLES DRAWN AEROBIC AND ANAEROBIC Blood Culture adequate volume   Culture NO GROWTH 2 DAYS  Final   Report Status PENDING  Incomplete         Radiology Studies: Dg Chest 2 View  Result Date: 12/03/2017 CLINICAL DATA:  Hypoxia EXAM: CHEST  2 VIEW COMPARISON:  12/01/2017 FINDINGS: Cardiac shadow is enlarged. Aortic calcifications are noted. Some platelike atelectasis is noted in the left mid lung. Small bilateral pleural effusions are again seen. No bony abnormality is noted. IMPRESSION: Small effusions stable from the prior CT. Mild left midlung atelectasis. Electronically Signed   By: Inez Catalina M.D.   On: 12/03/2017 07:59  Scheduled Meds: . aspirin EC  81 mg Oral Daily  . enoxaparin (LOVENOX) injection  40 mg Subcutaneous Q24H  . feeding supplement (ENSURE ENLIVE)  237 mL Oral BID BM  . furosemide  40 mg Intravenous Q12H  . gabapentin  100 mg Oral BID  . insulin aspart  15 Units Subcutaneous TID WC  . insulin glargine  45 Units Subcutaneous QHS  . levothyroxine  25 mcg Oral QAC breakfast  . metoprolol tartrate  25 mg Oral BID  . oxybutynin  5 mg Oral Daily  . potassium chloride SA  40 mEq Oral BID   Continuous Infusions:   LOS: 1 day    Time spent: 27mn    PDomenic Polite MD [facility] Page via [email or web address], password [email or web address] After 7PM please contact night-coverage  12/03/2017, 12:32 PM

## 2017-12-03 NOTE — Progress Notes (Signed)
Patient was given AD forms and would like time to read over and make decisions.  She knows to have chaplain paged if she has questions and/or is ready to have notary and witnesses. Shared concern about 2 grandsons who are drug addicts and stress that causes the family-one changed to be wiccan-she is very concerned about that. Prayed for peace and healing.Conard Novak, Chaplain   12/03/17 1000  Clinical Encounter Type  Visited With Patient  Visit Type Initial;Spiritual support;Other (Comment) (Gave  AD forms to look over and make decisions page chaplain)  Referral From Patient  Consult/Referral To Chaplain  Spiritual Encounters  Spiritual Needs Literature;Prayer;Emotional  Stress Factors  Patient Stress Factors Family relationships (grandsons are addicts-1 turned wiccan)

## 2017-12-03 NOTE — Progress Notes (Signed)
CM consult for neb machine, HHRN for CHF follow up.  MD will need to enter orders for neb machine under "DME neb machine", and HHRN with face to face documentation.  Nurse case manager will arrange services.    Reinaldo Raddle, RN, BSN  Trauma/Neuro ICU Case Manager [phone]

## 2017-12-03 NOTE — Progress Notes (Signed)
Nutrition Brief Note  Patient identified on the Malnutrition Screening Tool (MST) Report  Wt Readings from Last 15 Encounters:  12/03/17 198 lb 10.2 oz (90.1 kg)  11/30/17 195 lb (88.5 kg)  11/09/17 194 lb (88 kg)  09/12/17 200 lb (90.7 kg)  06/21/17 203 lb (92.1 kg)  06/05/17 203 lb 12.8 oz (92.4 kg)  05/28/17 206 lb 12.8 oz (93.8 kg)  05/07/17 200 lb (90.7 kg)  03/05/17 206 lb 6.4 oz (93.6 kg)  02/22/17 204 lb 12.8 oz (92.9 kg)  10/24/16 204 lb 6.4 oz (92.7 kg)  04/20/16 199 lb 9.6 oz (90.5 kg)  04/09/16 197 lb (89.4 kg)  02/29/16 196 lb 6.4 oz (89.1 kg)  10/05/15 193 lb 12.8 oz (87.9 kg)    Body mass index is 34.63 kg/m. Patient meets criteria for class I obesity based on current BMI. Pt reports recent weight loss related to diuretics and eating healthier.   Current diet order is heart healthy/carbohydrate modified, patient is consuming approximately 100% of meals at this time. Labs and medications reviewed. Pt currently has Ensure ordered and would like to continue with them.   No further nutrition interventions warranted at this time. If nutrition issues arise, please consult RD.   Corrin Parker, MS, RD, LDN Pager # [phone] After hours/ weekend pager # [phone]

## 2017-12-03 NOTE — Telephone Encounter (Signed)
Received results from [place]; forwarded to provider/SLS 2/31

## 2017-12-04 LAB — BASIC METABOLIC PANEL
Anion gap: 8 (ref 5–15)
BUN: 29 mg/dL — ABNORMAL HIGH (ref 6–20)
CALCIUM: 8.8 mg/dL — AB (ref 8.9–10.3)
CO2: 31 mmol/L (ref 22–32)
CREATININE: 0.95 mg/dL (ref 0.44–1.00)
Chloride: 97 mmol/L — ABNORMAL LOW (ref 101–111)
GFR, EST NON AFRICAN AMERICAN: 55 mL/min — AB (ref >60)
Glucose, Bld: 161 mg/dL — ABNORMAL HIGH (ref 65–99)
Potassium: 4.2 mmol/L (ref 3.5–5.1)
SODIUM: 136 mmol/L (ref 135–145)

## 2017-12-04 LAB — GLUCOSE, CAPILLARY
GLUCOSE-CAPILLARY: 159 mg/dL — AB (ref 65–99)
GLUCOSE-CAPILLARY: 100 mg/dL — AB (ref 65–99)
Glucose-Capillary: 157 mg/dL — ABNORMAL HIGH (ref 65–99)
Glucose-Capillary: 241 mg/dL — ABNORMAL HIGH (ref 65–99)

## 2017-12-04 MED ORDER — FUROSEMIDE 40 MG PO TABS
40.0000 mg | ORAL_TABLET | Freq: Two times a day (BID) | ORAL | Status: DC
  Administered 2017-12-04 – 2017-12-05 (×3): 40 mg via ORAL
  Filled 2017-12-04 (×3): qty 1

## 2017-12-04 NOTE — Progress Notes (Signed)
PROGRESS NOTE    ELIYA Dyer  WUJ:[id number] DOB: 07/01/1935 DOA: 11/30/2017 PCP: Ann Held, DO  Brief Narrative: Margaret Dyer a 83 y.o.femalewith medical history significant ofasthma, diastolic CHF, DM, hypothyroidism. She was seen by her PCP's office on 12/28 for evaluation of SOB. She has been short of breath for since her discharge from the hospital in June. Had work up then for SOB including evaluation by cardiology and pulmonary with RHC and echo. Ultimately found to have diastolic chd needing diuresis but admits poor compliance with Lasix due to polyuria. CTA chest noted no PE and small pleural effusions and bibasilar atelectasis versus scarring, pneumonia not excluded, she was started on IV antibiotics on admission   Assessment & Plan:   1. Acute on chronic diastolic CHF -clinically volume overloaded with bilateral pleural effusions -Last echo 6/18 with normal EF and grade 2 diastolic dysfunction -doubt pneumonia in the absence of productive cough fever or leukocytosis,  Low pro calcitonin and repeat x-ray without evidence of pneumonia  -Stopped antibiotics -Improved with diuresis, unfortunately urine output was not recorded except for the last 24 hours, during which time she is -2.4 L  -Appears close to euvolemic and has lost her IV today  -Changed to oral Lasix 40 mg by mouth twice a day -Compliance with medications and low-salt diet emphasized  -ambulate, physical therapy evaluation and wean off oxygen  2. Diabetes mellitus type 2 -Continue Lantus, sliding scale insulin -Stable  4. CK D stage III -Stable, monitor with diuresis  5. Essential hypertension Stopped hydralazine and hold amlodipine to allow more room for diuresis  6. Right middle lobe lung nodule incidentally noted on CT -Needs follow-up, remote smoking history  7. Hypothyroidism, continue Synthroid  8.  Anxiety and depression  -Continue home medications   DVT  prophylaxis: Lovenox  Code Status:  full code  Family Communication no familyat bedside ,  discussed with sister yesterday  Disposition Plan: Home  tomorrow if stable   Procedures:   Antimicrobials:    Subjective: -Fills much better, breathing continues to improve   Objective: Vitals:   12/03/17 1412 12/03/17 2114 12/03/17 2117 12/04/17 0559  BP: (!) 131/58 (!) 159/60 (!) 159/60 (!) 140/55  Pulse: 64 65 65 (!) 59  Resp:    14  Temp: 98.1 F (36.7 C)  97.8 F (36.6 C) 97.7 F (36.5 C)  TempSrc: Oral  Oral Oral  SpO2: 98%  98% 98%  Weight:    88.8 kg (195 lb 12.3 oz)  Height:        Intake/Output Summary (Last 24 hours) at 12/04/2017 1053 Last data filed at 12/04/2017 1018 Gross per 24 hour  Intake 240 ml  Output 2600 ml  Net -2360 ml   Filed Weights   12/02/17 0500 12/03/17 0500 12/04/17 0559  Weight: 89.1 kg (196 lb 6.9 oz) 90.1 kg (198 lb 10.2 oz) 88.8 kg (195 lb 12.3 oz)    Examination:  General exam: Appears calm and comfortable, no distress, AAO 3 Respiratory system: . crackles in both lower lungs  Cardiovascular system: S1 & S2 heard, RRR. No JVD, murmurs, rubs, gallops Gastrointestinal system: Abdomen is nondistended, soft and nontender.Normal bowel sounds heard. Central nervous system: Alert and oriented. No focal neurological deficits. Extremities: 1plus edema Skin: No rashes, lesions or ulcers Psychiatry:  andJudgement and insight appear normal. Mood & affect appropriate.     Data Reviewed:   CBC: Recent Labs  Lab 11/30/17 1559 12/02/17 0327 12/03/17 9562  WBC 8.6 6.8 8.2  NEUTROABS 6,072  --   --   HGB 14.1 12.7 12.8  HCT 41.4 39.7 39.8  MCV 87.3 92.1 91.3  PLT 153 117* 161*   Basic Metabolic Panel: Recent Labs  Lab 11/30/17 1559 12/02/17 0327 12/03/17 0213  NA 142 137 135  K 3.8 3.8 4.1  CL 102 100* 99*  CO2 _0 GLUCOSE 135* 155* 172*  BUN 28* 29* 29*  CREATININE 1.06* 1.14* 1.14*  CALCIUM 9.4 8.7* 8.7*    GFR: Estimated Creatinine Clearance: 42.1 mL/min (A) (by C-G formula based on SCr of 1.14 mg/dL (H)). Liver Function Tests: Recent Labs  Lab 11/30/17 1559  AST 24  ALT 24  BILITOT 0.8  PROT 6.6   No results for input(s): LIPASE, AMYLASE in the last 168 hours. No results for input(s): AMMONIA in the last 168 hours. Coagulation Profile: No results for input(s): INR, PROTIME in the last 168 hours. Cardiac Enzymes: Recent Labs  Lab 11/30/17 1559 12/01/17 0845 12/01/17 1850  TROPONINI <0.01 <0.03 <0.03   BNP (last 3 results) No results for input(s): PROBNP in the last 8760 hours. HbA1C: No results for input(s): HGBA1C in the last 72 hours. CBG: Recent Labs  Lab 12/03/17 0837 12/03/17 1204 12/03/17 1649 12/03/17 2119 12/04/17 0749  GLUCAP 142* 169* 125* 110* 157*   Lipid Profile: No results for input(s): CHOL, HDL, LDLCALC, TRIG, CHOLHDL, LDLDIRECT in the last 72 hours. Thyroid Function Tests: No results for input(s): TSH, T4TOTAL, FREET4, T3FREE, THYROIDAB in the last 72 hours. Anemia Panel: No results for input(s): VITAMINB12, FOLATE, FERRITIN, TIBC, IRON, RETICCTPCT in the last 72 hours. Urine analysis:    Component Value Date/Time   COLORURINE YELLOW 05/17/2017 1212   APPEARANCEUR CLEAR 05/17/2017 1212   LABSPEC 1.010 05/17/2017 1212   PHURINE 5.0 05/17/2017 1212   GLUCOSEU NEGATIVE 05/17/2017 1212   HGBUR NEGATIVE 05/17/2017 1212   HGBUR large 02/17/2011 1432   BILIRUBINUR NEGATIVE 05/17/2017 1212   BILIRUBINUR Neg 02/19/2015 1512   KETONESUR NEGATIVE 05/17/2017 1212   PROTEINUR 100 (A) 05/17/2017 1212   UROBILINOGEN 2.0 02/19/2015 1512   UROBILINOGEN 0.2 02/17/2011 1432   NITRITE NEGATIVE 05/17/2017 1212   LEUKOCYTESUR NEGATIVE 05/17/2017 1212   Sepsis Labs: _1 (procalcitonin:4,lacticidven:4)  ) Recent Results (from the past 240 hour(s))  Culture, blood (routine x 2) Call MD if unable to obtain prior to antibiotics being given     Status:  None (Preliminary result)   Collection Time: 12/01/17  7:00 PM  Result Value Ref Range Status   Specimen Description BLOOD RIGHT ANTECUBITAL  Final   Special Requests   Final    BOTTLES DRAWN AEROBIC AND ANAEROBIC Blood Culture adequate volume   Culture NO GROWTH 2 DAYS  Final   Report Status PENDING  Incomplete  Culture, blood (routine x 2) Call MD if unable to obtain prior to antibiotics being given     Status: None (Preliminary result)   Collection Time: 12/01/17  7:05 PM  Result Value Ref Range Status   Specimen Description BLOOD LEFT ANTECUBITAL  Final   Special Requests   Final    BOTTLES DRAWN AEROBIC AND ANAEROBIC Blood Culture adequate volume   Culture NO GROWTH 2 DAYS  Final   Report Status PENDING  Incomplete         Radiology Studies: Dg Chest 2 View  Result Date: 12/03/2017 CLINICAL DATA:  Hypoxia EXAM: CHEST  2 VIEW COMPARISON:  12/01/2017 FINDINGS: Cardiac shadow is enlarged. Aortic  calcifications are noted. Some platelike atelectasis is noted in the left mid lung. Small bilateral pleural effusions are again seen. No bony abnormality is noted. IMPRESSION: Small effusions stable from the prior CT. Mild left midlung atelectasis. Electronically Signed   By: Inez Catalina M.D.   On: 12/03/2017 07:59        Scheduled Meds: . aspirin EC  81 mg Oral Daily  . enoxaparin (LOVENOX) injection  40 mg Subcutaneous Q24H  . feeding supplement (ENSURE ENLIVE)  237 mL Oral BID BM  . furosemide  40 mg Oral BID  . gabapentin  100 mg Oral BID  . insulin aspart  15 Units Subcutaneous TID WC  . insulin glargine  45 Units Subcutaneous QHS  . levothyroxine  25 mcg Oral QAC breakfast  . metoprolol tartrate  25 mg Oral BID  . oxybutynin  5 mg Oral Daily  . potassium chloride SA  40 mEq Oral BID   Continuous Infusions:   LOS: 2 days    Time spent: 66mn    PDomenic Polite MD [facility] Page via [email or web address], password [email or web address] After 7PM please contact  night-coverage  12/04/2017, 10:53 AM

## 2017-12-05 ENCOUNTER — Telehealth: Payer: Self-pay | Admitting: Internal Medicine

## 2017-12-05 ENCOUNTER — Other Ambulatory Visit: Payer: Self-pay | Admitting: Internal Medicine

## 2017-12-05 LAB — GLUCOSE, CAPILLARY
GLUCOSE-CAPILLARY: 119 mg/dL — AB (ref 65–99)
Glucose-Capillary: 139 mg/dL — ABNORMAL HIGH (ref 65–99)

## 2017-12-05 LAB — BASIC METABOLIC PANEL
Anion gap: 8 (ref 5–15)
BUN: 28 mg/dL — AB (ref 6–20)
CALCIUM: 9.2 mg/dL (ref 8.9–10.3)
CHLORIDE: 98 mmol/L — AB (ref 101–111)
CO2: 32 mmol/L (ref 22–32)
Creatinine, Ser: 0.95 mg/dL (ref 0.44–1.00)
GFR calc Af Amer: >60 mL/min (ref >60)
GFR calc non Af Amer: 55 mL/min — ABNORMAL LOW (ref >60)
GLUCOSE: 154 mg/dL — AB (ref 65–99)
Potassium: 4.2 mmol/L (ref 3.5–5.1)
Sodium: 138 mmol/L (ref 135–145)

## 2017-12-05 MED ORDER — AMLODIPINE BESYLATE 10 MG PO TABS
10.0000 mg | ORAL_TABLET | Freq: Every day | ORAL | Status: DC
  Administered 2017-12-05: 10 mg via ORAL
  Filled 2017-12-05: qty 1

## 2017-12-05 MED ORDER — INSULIN GLULISINE 100 UNIT/ML IJ SOLN: 15.0000 [IU] | Freq: Three times a day (TID) | INTRAMUSCULAR | Status: DC

## 2017-12-05 MED ORDER — FUROSEMIDE 40 MG PO TABS: 40.0000 mg | ORAL_TABLET | Freq: Two times a day (BID) | ORAL | 0 refills | Status: DC

## 2017-12-05 MED ORDER — ALBUTEROL SULFATE (2.5 MG/3ML) 0.083% IN NEBU: 2.5000 mg | INHALATION_SOLUTION | Freq: Four times a day (QID) | RESPIRATORY_TRACT | 1 refills | Status: DC | PRN

## 2017-12-05 MED ORDER — POTASSIUM CHLORIDE ER 20 MEQ PO TBCR: 1.0000 | EXTENDED_RELEASE_TABLET | Freq: Two times a day (BID) | ORAL | 0 refills | Status: DC

## 2017-12-05 MED ORDER — AEROCHAMBER PLUS FLO-VU LARGE MISC
1.0000 | Freq: Once | Status: AC
  Administered 2017-12-05: 1
  Filled 2017-12-05 (×5): qty 1

## 2017-12-05 MED ORDER — HYDRALAZINE HCL 50 MG PO TABS: 25.0000 mg | ORAL_TABLET | Freq: Three times a day (TID) | ORAL | 0 refills | Status: DC

## 2017-12-05 MED ORDER — HYDRALAZINE HCL 25 MG PO TABS
25.0000 mg | ORAL_TABLET | Freq: Three times a day (TID) | ORAL | Status: DC
  Administered 2017-12-05: 25 mg via ORAL
  Filled 2017-12-05: qty 1

## 2017-12-05 NOTE — Telephone Encounter (Signed)
Margaret Dyer Is wanting to let you know that she went into the hospital on Friday night and was release on today . The doctor at the hospital  cut down her Hydralazine to 25 mg because of the fluid retention  and she does feel comfortable with the change until she speaks with Dr. Debara Pickett. Please call at her cell # [phone] and if no answer please call  Daughter ph# that is where she is [phone]

## 2017-12-05 NOTE — Consult Note (Signed)
[facility] CM Primary Care Navigator  12/05/2017  Margaret Dyer [birth year] [id number]   Went to see patient at the bedside to identify possible discharge needs. Patient reports feeling "very short of breath"thathad led to this admission.  [place] with [employer] [facility] [employer] as theprimary care provider.   Patient shared [place] and [place] on [place] to obtain medications without difficulty for now. She mentioned that she has been getting medication assistance for medications that she could hardly afford like insulin. Patient is aware to get referral from primary care provider to [facility] care management if further medication assistance will be needed once discharged.  Patientreports managinghermedications at home with daughter's assistance using "pill box" system filled weekly.  Patient verbalized that her daughter Margaret Dyer) has been providing transportation to her doctors' appointments.    Patient's daughter is the primary caregiverat home. Patient reports she has been staying at her daughter's house since June.  Anticipated discharge plan is homeaccording topatient. She reports going back to her daughter's house to stay upon discharge.  Patient voiced understanding to call primary care provider's office when she returns home, for a post discharge follow-up appointment within1-2 weeksor sooner if needs arise. Patient letter (with PCP's contact number) was provided as a reminder.   Patient was treated for acute on chronic diastolic HF on this admission.  She states having little awareness of ways to manage heart failure.She reports havingaweighing scale at her daughter's house but not monitoring her weight. She also mentioned needing to be more compliant with diet restrictions and adhere to her medications. Patient is unsure of HF signs and symptoms needing medical  assistance. Explained to patient about [facility] care management services available for her at home and she was quite interested of it, although she declined to have [facility] CM home visits for now, since home health RN is arranged to follow-up with her (HF) upon discharge. She states not wanting to be overwhelmed as she recovers.  Patientonly verbally agreed and opted for [facility] Heart Failure calls to follow-up with recovery at home.   Referral made for [facility] HF calls to monitor recovery after discharge.   Patient was encouraged and she expressed understanding of need to seek referral to [facility] care management from primary care provider if she is needing further assistance managing in the near future.  THNcare managementinformation provided for future needs that may arise.  Primary care provider's office is listed as providing transition of care (TOC).    For additional questions please contact:  Margaret Dyer, BSN, RN-BC [facility] PRIMARY CARE Navigator Cell: [phone]

## 2017-12-05 NOTE — Telephone Encounter (Signed)
Spoke with pt, aware that per the discharge summary from the hospital the hydralazine was decreased due to fluid being taken off and the worry of hypotension. Patient voiced understanding and will wait for a call for follow up appt. She will track her bp and let us know if it is running high.

## 2017-12-05 NOTE — Discharge Summary (Signed)
Physician Discharge Summary  Margaret Dyer XTG:[id number] DOB: 26-Aug-1935 DOA: 11/30/2017  PCP: Ann Held, DO  Admit date: 11/30/2017 Discharge date: 12/05/2017  Time spent: 45 minutes  Recommendations for Outpatient Follow-up:  1. PCP in 1 week 2. Right middle lobe nodule incidentally noted on CT chest needs follow-up CT chest in 12 months, she has remote smoking history   Discharge Diagnoses:    Acute on chronic diastolic CHF   Obesity   Chronic kidney disease stage III  Right middle lobe lung nodule   Essential hypertension   Anxiety and depression   Hypertension   Diabetes mellitus type 2, insulin dependent (HCC)   Hypothyroidism   Chronic diastolic CHF (congestive heart failure) ([place])   Pneumonia   Discharge Condition: Stable  Diet recommendation: Diabetic, this low sodium heart healthy  Filed Weights   12/03/17 0500 12/04/17 0559 12/05/17 0500  Weight: 90.1 kg (198 lb 10.2 oz) 88.8 kg (195 lb 12.3 oz) 86.9 kg (191 lb 9.3 oz)    History of present illness:  Margaret Dyer a 83 y.o.femalewith medical history significant ofasthma,diastolic CHF,DM, hypothyroidism. She was seen by her PCP's office on 12/28 for evaluation of SOB. She has been short of breath for since her discharge from the hospital in June. Had work up then for SOB including evaluation by cardiology and pulmonary with RHC and echo. Ultimately found tohave diastolic chd needingdiuresisbut admits poor compliance with Lasix due to polyuria. CTA chest noted no PE and small pleural effusions and bibasilar atelectasis  Hospital Course:  1.Acute on chronic diastolic CHF -clinically volume overloaded with bilateral pleural effusions -Last echo 6/18 with normal EF and grade 2 diastolic dysfunction -doubt pneumonia in the absence of productive cough fever or leukocytosis,  Low pro calcitonin and repeat x-ray without evidence of pneumonia  -Stopped antibiotics -Improved with diuresis,  unfortunately urine output was not recorded except for the last 72 hours, during which time she is -6 L  -Appears close to euvolemic, Changed to oral Lasix 40 mg by mouth twice a day -Compliance with medications and low-salt diet emphasized  - physical therapy evaluation completed, weaned off O2 -Home health RN set up post discharge  2.Diabetes mellitus type 2 -Continue Lantus, sliding scale insulin -Stable  4.CK D stage III -Stable, monitor with diuresis  5.Essential hypertension -resumed hydralazine and amlodipine  6.Right middle lobe lung nodule incidentally noted on CT -Needs follow-up, remote smoking history  7.Hypothyroidism, continue Synthroid  8.Anxiety and depression -Continue home medications     Discharge Exam: Vitals:   12/05/17 0600 12/05/17 0905  BP: (!) 134/53 (!) 168/66  Pulse: 63 70  Resp: 15   Temp: 98.1 F (36.7 C)   SpO2: 94%     General: AAOx3 Cardiovascular: S1S2/RRR Respiratory: CTAB  Discharge Instructions   Discharge Instructions    Diet - low sodium heart healthy   Complete by:  As directed    Diet Carb Modified   Complete by:  As directed    Increase activity slowly   Complete by:  As directed      Allergies as of 12/05/2017      Reactions   Cefuroxime Axetil    Upset stomach   Ciprofloxacin    Upset stomach      Medication List    TAKE these medications   albuterol 108 (90 Base) MCG/ACT inhaler Commonly known as:  PROVENTIL HFA;VENTOLIN HFA Inhale 2 puffs into the lungs every 6 (six) hours as needed for wheezing  or shortness of breath.   albuterol (2.5 MG/3ML) 0.083% nebulizer solution Commonly known as:  PROVENTIL Take 3 mLs (2.5 mg total) by nebulization every 6 (six) hours as needed for wheezing or shortness of breath.   amLODipine 10 MG tablet Commonly known as:  NORVASC take 1 tablet by mouth every morning   aspirin EC 81 MG tablet Take 1 tablet (81 mg total) by mouth daily.   beclomethasone  40 MCG/ACT inhaler Commonly known as:  QVAR REDIHALER Inhale 2 puffs into the lungs 2 (two) times daily.   furosemide 40 MG tablet Commonly known as:  LASIX Take 1 tablet (40 mg total) by mouth 2 (two) times daily.   gabapentin 100 MG capsule Commonly known as:  NEURONTIN take 1 capsule by mouth twice a day What changed:    how much to take  how to take this  when to take this   hydrALAZINE 50 MG tablet Commonly known as:  APRESOLINE Take 0.5 tablets (25 mg total) by mouth every 8 (eight) hours. What changed:  how much to take   insulin glargine 100 UNIT/ML injection Commonly known as:  LANTUS Inject 50 Units into the skin every evening.   insulin glulisine 100 UNIT/ML injection Commonly known as:  APIDRA Inject 0.15 mLs (15 Units total) into the skin 3 (three) times daily with meals.   ipratropium 17 MCG/ACT inhaler Commonly known as:  ATROVENT HFA Inhale 2 puffs into the lungs every 6 (six) hours as needed for wheezing.   levothyroxine 25 MCG tablet Commonly known as:  SYNTHROID, LEVOTHROID take 1 tablet by mouth daily   LORazepam 0.5 MG tablet Commonly known as:  ATIVAN Take 1 tablet (0.5 mg total) by mouth 2 (two) times daily as needed.   metoprolol tartrate 25 MG tablet Commonly known as:  LOPRESSOR Take 1 tablet (25 mg total) by mouth 2 (two) times daily.   oxybutynin 5 MG tablet Commonly known as:  DITROPAN Take 1 tablet (5 mg total) by mouth every morning.   Potassium Chloride ER 20 MEQ Tbcr Take 1 tablet by mouth 2 (two) times daily. What changed:  when to take this   traMADol 50 MG tablet Commonly known as:  ULTRAM 1 po q6h prn What changed:    how much to take  how to take this  when to take this  reasons to take this  additional instructions            Durable Medical Equipment  (From admission, onward)        Start     Ordered   12/05/17 1149  For home use only DME Nebulizer/meds  Once    Question:  Patient needs a  nebulizer to treat with the following condition  Answer:  Pneumonia   12/05/17 1149     Allergies  Allergen Reactions  . Cefuroxime Axetil     Upset stomach   . Ciprofloxacin     Upset stomach   Follow-up Information    Ann Held, DO. Schedule an appointment as soon as possible for a visit in 1 week(s).   Specialty:  Family Medicine Contact information: [address] [place] [address] [phone]            The results of significant diagnostics from this hospitalization (including imaging, microbiology, ancillary and laboratory) are listed below for reference.    Significant Diagnostic Studies: Dg Chest 2 View  Result Date: 12/03/2017 CLINICAL DATA:  Hypoxia  EXAM: CHEST  2 VIEW COMPARISON:  12/01/2017 FINDINGS: Cardiac shadow is enlarged. Aortic calcifications are noted. Some platelike atelectasis is noted in the left mid lung. Small bilateral pleural effusions are again seen. No bony abnormality is noted. IMPRESSION: Small effusions stable from the prior CT. Mild left midlung atelectasis. Electronically Signed   By: Inez Catalina M.D.   On: 12/03/2017 07:59   Ct Angio Chest Pe W And/or Wo Contrast  Result Date: 12/01/2017 CLINICAL DATA:  83 year old female with shortness of breath. Concern for PE. EXAM: CT ANGIOGRAPHY CHEST WITH CONTRAST TECHNIQUE: Multidetector CT imaging of the chest was performed using the standard protocol during bolus administration of intravenous contrast. Multiplanar CT image reconstructions and MIPs were obtained to evaluate the vascular anatomy. CONTRAST:  184m ISOVUE-370 IOPAMIDOL (ISOVUE-370) INJECTION 76% COMPARISON:  Chest radiograph dated 12/01/2017 and chest CT dated 05/20/2017 FINDINGS: Cardiovascular: There is mild cardiomegaly. Multi vessel coronary vascular calcification. No pericardial effusion. Moderate atherosclerotic calcification of the thoracic aorta. Evaluation of the aorta is limited due to suboptimal  enhancement and timing of the contrast. Evaluation of the pulmonary artery is is limited due to suboptimal opacification of the peripheral branches. There is no CT evidence of acute pulmonary artery embolus. Low attenuating area along the right lower lobe pulmonary artery (series 7, image 143) as well as adjacent to the right upper lobe pulmonary artery (series 7, image 63) appear chronic and extravascular. Mediastinum/Nodes: Mildly prominent bilateral hilar and mediastinal lymph nodes. No mediastinal fluid collection or hematoma. Lungs/Pleura: Small bilateral pleural effusions. Bibasilar atelectasis/ scarring similar or slightly improved compared to the prior CT. Pneumonia is not entirely excluded. Bilateral hazy ground-glass density may represent atelectasis and less likely edema. Clinical correlation is recommended. There is a 5 mm right middle lobe pulmonary nodule (series 6, image 59). No pneumothorax. The central airways are patent. Upper Abdomen: Cirrhosis.  Bilateral adrenal nodularity. Musculoskeletal: Degenerative changes of the spine. Review of the MIP images confirms the above findings. IMPRESSION: 1. No CT evidence of acute pulmonary artery embolus. 2. Small bilateral pleural effusions and bibasilar atelectasis/scarring. Pneumonia is not excluded. Mild diffuse hazy airspace density may represent atelectasis and less likely edema. Clinical correlation is recommended. 3. A 5 mm right middle lobe pulmonary nodule. No follow-up needed if patient is low-risk. Non-contrast chest CT can be considered in 12 months if patient is high-risk. This recommendation follows the consensus statement: Guidelines for Management of Incidental Pulmonary Nodules Detected on CT Images: From the Fleischner Society 2017; Radiology 2017; 284:228-243. 4. Cardiomegaly with coronary vascular calcification. 5.  Aortic Atherosclerosis (ICD10-I70.0). Electronically Signed   By: AAnner CreteM.D.   On: 12/01/2017 04:04   Dg Chest  Port 1 View  Result Date: 12/01/2017 CLINICAL DATA:  83year old female with shortness of breath. EXAM: PORTABLE CHEST 1 VIEW COMPARISON:  Chest CT dated 05/20/2017 FINDINGS: Linear atelectasis/ scarring in the left mid lung field. Left lung base densities likely atelectatic changes. Pneumonia is less likely but not excluded. The right lung is clear. There is no pleural effusion or pneumothorax. Mildly enlarged cardiac silhouette. There is atherosclerotic calcification of the aortic arch. No acute osseous pathology. IMPRESSION: Left lung base atelectasis/ scarring. Pneumonia is less likely. Clinical correlation is recommended. Electronically Signed   By: AAnner CreteM.D.   On: 12/01/2017 00:59    Microbiology: Recent Results (from the past 240 hour(s))  Culture, blood (routine x 2) Call MD if unable to obtain prior to antibiotics being given  Status: None (Preliminary result)   Collection Time: 12/01/17  7:00 PM  Result Value Ref Range Status   Specimen Description BLOOD RIGHT ANTECUBITAL  Final   Special Requests   Final    BOTTLES DRAWN AEROBIC AND ANAEROBIC Blood Culture adequate volume   Culture NO GROWTH 3 DAYS  Final   Report Status PENDING  Incomplete  Culture, blood (routine x 2) Call MD if unable to obtain prior to antibiotics being given     Status: None (Preliminary result)   Collection Time: 12/01/17  7:05 PM  Result Value Ref Range Status   Specimen Description BLOOD LEFT ANTECUBITAL  Final   Special Requests   Final    BOTTLES DRAWN AEROBIC AND ANAEROBIC Blood Culture adequate volume   Culture NO GROWTH 3 DAYS  Final   Report Status PENDING  Incomplete     Labs: Basic Metabolic Panel: Recent Labs  Lab 11/30/17 1559 12/02/17 0327 12/03/17 0213 12/04/17 1005 12/05/17 0431  NA 142 137 135 136 138  K 3.8 3.8 4.1 4.2 4.2  CL 102 100* 99* 97* 98*  CO2 _0 32  GLUCOSE 135* 155* 172* 161* 154*  BUN 28* 29* 29* 29* 28*  CREATININE 1.06* 1.14* 1.14* 0.95  0.95  CALCIUM 9.4 8.7* 8.7* 8.8* 9.2   Liver Function Tests: Recent Labs  Lab 11/30/17 1559  AST 24  ALT 24  BILITOT 0.8  PROT 6.6   No results for input(s): LIPASE, AMYLASE in the last 168 hours. No results for input(s): AMMONIA in the last 168 hours. CBC: Recent Labs  Lab 11/30/17 1559 12/02/17 0327 12/03/17 0213  WBC 8.6 6.8 8.2  NEUTROABS 6,072  --   --   HGB 14.1 12.7 12.8  HCT 41.4 39.7 39.8  MCV 87.3 92.1 91.3  PLT 153 117* 116*   Cardiac Enzymes: Recent Labs  Lab 11/30/17 1559 12/01/17 0845 12/01/17 1850  TROPONINI <0.01 <0.03 <0.03   BNP: BNP (last 3 results) Recent Labs    05/12/17 2052 11/30/17 1559  BNP 219.3* 153*    ProBNP (last 3 results) No results for input(s): PROBNP in the last 8760 hours.  CBG: Recent Labs  Lab 12/04/17 1150 12/04/17 1649 12/04/17 2213 12/05/17 0758 12/05/17 1254  GLUCAP 159* 100* 241* 139* 119*       Signed:  Domenic Polite MD.  [facility] 12/05/2017, 2:47 PM

## 2017-12-05 NOTE — Progress Notes (Signed)
[name] to be D/C'd to home with home health per MD order.  Discussed with the patient and all questions fully answered.  VSS, Skin clean, dry and intact without evidence of skin break down, no evidence of skin tears noted. IV catheter discontinued intact. Site without signs and symptoms of complications. Dressing and pressure applied.  An After Visit Summary was printed and given to the patient. Patient received prescriptions, spacer for inhaler, and nebulizer.  D/c education completed with patient/family including follow up instructions, medication list, d/c activities limitations if indicated, with other d/c instructions as indicated by MD - patient able to verbalize understanding, all questions fully answered.   Patient instructed to return to ED, call 911, or call MD for any changes in condition.   Patient escorted via [place], and D/C home via private auto.  Morley Kos Price 12/05/2017 1:44 PM

## 2017-12-05 NOTE — Care Management Note (Signed)
Case Management Note  Patient Details  Name: Margaret Dyer MRN: [id number] Date of Birth: 1935-11-20  Subjective/Objective:   Pt admitted with Pneumonia. She is from home.               Action/Plan: Pt discharging to her daughters home: [facility] in [place] with [facility] services. CM provided her choice and she selected [facility]. Butch Penny with [facility] notified and accepted the referral. Pt has orders for nebulizer machine. Butch Penny with [facility] notified and will deliver to the room.  Pt states she is able to afford her medications except for the inhalers. She states she is able to afford the nebulizer soln better. She states she uses the discount cards d/t not having medication coverage through her insurance. CM provided her the Medicare Assistance number to see about signing up for medication coverage. CM also provided her another medication assistance card.  Daughter to provide transportation home.   Expected Discharge Date:  12/05/17               Expected Discharge Plan:  [place]  In-House Referral:     Discharge planning Services  CM Consult  Post Acute Care Choice:  Durable Medical Equipment, Home Health Choice offered to:  Patient  DME Arranged:  Nebulizer machine DME Agency:  [facility] Arranged:  RN [facility] Agency:  [facility]  Status of Service:  Completed, signed off  If discussed at [place] of Stay Meetings, dates discussed:    Additional Comments:  Pollie Friar, RN 12/05/2017, 2:18 PM

## 2017-12-05 NOTE — Telephone Encounter (Signed)
Left message for pt to call, no answer on cell number.

## 2017-12-06 ENCOUNTER — Other Ambulatory Visit: Payer: Self-pay | Admitting: *Deleted

## 2017-12-06 ENCOUNTER — Telehealth: Payer: Self-pay

## 2017-12-06 LAB — CULTURE, BLOOD (ROUTINE X 2)
CULTURE: NO GROWTH
CULTURE: NO GROWTH
SPECIAL REQUESTS: ADEQUATE
Special Requests: ADEQUATE

## 2017-12-06 MED ORDER — AMLODIPINE BESYLATE 10 MG PO TABS: 10.0000 mg | ORAL_TABLET | Freq: Every morning | ORAL | 4 refills | Status: DC

## 2017-12-06 NOTE — Telephone Encounter (Signed)
12/06/17  TCM Hospital Follow Up  Transition Care Management Follow-up Telephone Call  ADMISSION DATE: 11/30/17  DISCHARGE DATE: 12/05/17   How have you been since you were released from the hospital? Feeling better    Do you understand why you were in the hospital?  Yes   Do you understand the discharge instrcutions? Yes    Items Reviewed:  Medications reviewed: Yes, not using inhalers. Cannot afford.    Allergies reviewed: Yes   Dietary changes reviewed: Low sodium Heart healthy Carbohydrate modified.   Referrals reviewed: Hospital Follow Up Appointment Scheduled   Functional Questionnaire:   Activities of Daily Living (ADLs): Patient states she can perform all independently  Any patient concerns? Patient states she is not using inhalers because she cannot afford. States she is using nebulizer  Confirmed importance and date/time of follow-up visits scheduled:Yes  Confirmed with patient if condition begins to worsen call PCP or go to the ER. Yes    Patient was given the office number and encouragred to call back with questions or concerns. Yes

## 2017-12-10 ENCOUNTER — Other Ambulatory Visit: Payer: Self-pay

## 2017-12-10 NOTE — Patient Outreach (Signed)
[facility]) Care Management  12/10/2017  Margaret Dyer [birth year] [id number]     EMMI-HF RED ON EMMI ALERT Day # 2 Date: 12/07/17 Red Alert Reason: " Weighed themselves today? No"     Outreach attempt # 1 to patient. A female answered and reported patient was "gone for the day." Advised that RN CM would call back at another time.         Plan: RN CM will make outreach attempt to patient within one business day.    Enzo Montgomery, RN,BSN,CCM [place] Management Telephonic Care Management Coordinator Direct Phone: [phone] Toll Free: [phone] Fax: [phone]

## 2017-12-11 ENCOUNTER — Other Ambulatory Visit: Payer: Self-pay

## 2017-12-11 ENCOUNTER — Telehealth: Payer: Self-pay | Admitting: *Deleted

## 2017-12-11 NOTE — Patient Outreach (Signed)
[facility]) Care Management  12/11/2017  ELVIA AYDIN 1935-02-27 [id number]   EMMI-HF RED ON EMMI ALERT Day # 2 Date: 12/07/17 Red Alert Reason: " Weighed themselves today? No"    Outreach attempt #2 to patient. No answer at present and unable to leave voicemail message.    Plan: RN CM will send unsuccessful outreach letter to patient and close case if no response within 10 business days.   Enzo Montgomery, RN,BSN,CCM [place] [facility] Telephonic Care Management Coordinator Direct Phone: [phone] Toll Free: [phone] Fax: [phone]

## 2017-12-11 NOTE — Telephone Encounter (Signed)
Pt refusal noted

## 2017-12-11 NOTE — Telephone Encounter (Signed)
Copied from [place] [phone]. Topic: General - Other >> Dec 11, 2017 10:10 AM Synthia Innocent wrote: Reason for CRM: Patient refused home health eval.

## 2017-12-12 ENCOUNTER — Telehealth: Payer: Self-pay | Admitting: Internal Medicine

## 2017-12-12 MED ORDER — HYDRALAZINE HCL 50 MG PO TABS: 25.0000 mg | ORAL_TABLET | Freq: Three times a day (TID) | ORAL | 0 refills | Status: DC

## 2017-12-12 NOTE — Telephone Encounter (Signed)
New message      *STAT* If patient is at the pharmacy, call can be transferred to refill team.   1. Which medications need to be refilled? (please list name of each medication and dose if known) hydrALAZINE (APRESOLINE) 50 MG tablet  2. Which pharmacy/location (including street and city if local pharmacy) is medication to be sent to?  [facility] [place] [address] [phone] (Phone)     3. Do they need a 30 day or 90 day supply?

## 2017-12-13 ENCOUNTER — Other Ambulatory Visit: Payer: Self-pay

## 2017-12-13 ENCOUNTER — Telehealth: Payer: Self-pay | Admitting: *Deleted

## 2017-12-13 MED ORDER — HYDRALAZINE HCL 50 MG PO TABS: 25.0000 mg | ORAL_TABLET | Freq: Three times a day (TID) | ORAL | 2 refills | Status: DC

## 2017-12-13 NOTE — Telephone Encounter (Signed)
Rx(s) sent to pharmacy electronically.

## 2017-12-13 NOTE — Telephone Encounter (Signed)
Received Physician Orders from [facility]; forwarded to provider/SLS 01/10

## 2017-12-14 ENCOUNTER — Inpatient Hospital Stay: Payer: Medicare Other | Admitting: Family Medicine

## 2017-12-16 DIAGNOSIS — Z7982 Long term (current) use of aspirin: Secondary | ICD-10-CM | POA: Diagnosis not present

## 2017-12-16 DIAGNOSIS — E785 Hyperlipidemia, unspecified: Secondary | ICD-10-CM | POA: Diagnosis not present

## 2017-12-16 DIAGNOSIS — E1122 Type 2 diabetes mellitus with diabetic chronic kidney disease: Secondary | ICD-10-CM | POA: Diagnosis not present

## 2017-12-16 DIAGNOSIS — E669 Obesity, unspecified: Secondary | ICD-10-CM | POA: Diagnosis not present

## 2017-12-16 DIAGNOSIS — Z794 Long term (current) use of insulin: Secondary | ICD-10-CM | POA: Diagnosis not present

## 2017-12-16 DIAGNOSIS — R918 Other nonspecific abnormal finding of lung field: Secondary | ICD-10-CM | POA: Diagnosis not present

## 2017-12-16 DIAGNOSIS — N183 Chronic kidney disease, stage 3 (moderate): Secondary | ICD-10-CM | POA: Diagnosis not present

## 2017-12-16 DIAGNOSIS — K7581 Nonalcoholic steatohepatitis (NASH): Secondary | ICD-10-CM | POA: Diagnosis not present

## 2017-12-16 DIAGNOSIS — Z85528 Personal history of other malignant neoplasm of kidney: Secondary | ICD-10-CM | POA: Diagnosis not present

## 2017-12-16 DIAGNOSIS — K7460 Unspecified cirrhosis of liver: Secondary | ICD-10-CM | POA: Diagnosis not present

## 2017-12-16 DIAGNOSIS — I5033 Acute on chronic diastolic (congestive) heart failure: Secondary | ICD-10-CM | POA: Diagnosis not present

## 2017-12-16 DIAGNOSIS — F329 Major depressive disorder, single episode, unspecified: Secondary | ICD-10-CM | POA: Diagnosis not present

## 2017-12-16 DIAGNOSIS — I2510 Atherosclerotic heart disease of native coronary artery without angina pectoris: Secondary | ICD-10-CM | POA: Diagnosis not present

## 2017-12-16 DIAGNOSIS — F419 Anxiety disorder, unspecified: Secondary | ICD-10-CM | POA: Diagnosis not present

## 2017-12-16 DIAGNOSIS — E039 Hypothyroidism, unspecified: Secondary | ICD-10-CM | POA: Diagnosis not present

## 2017-12-16 DIAGNOSIS — J45909 Unspecified asthma, uncomplicated: Secondary | ICD-10-CM | POA: Diagnosis not present

## 2017-12-16 DIAGNOSIS — I272 Pulmonary hypertension, unspecified: Secondary | ICD-10-CM | POA: Diagnosis not present

## 2017-12-16 DIAGNOSIS — I130 Hypertensive heart and chronic kidney disease with heart failure and stage 1 through stage 4 chronic kidney disease, or unspecified chronic kidney disease: Secondary | ICD-10-CM | POA: Diagnosis not present

## 2017-12-16 DIAGNOSIS — Z87891 Personal history of nicotine dependence: Secondary | ICD-10-CM | POA: Diagnosis not present

## 2017-12-16 DIAGNOSIS — Z905 Acquired absence of kidney: Secondary | ICD-10-CM | POA: Diagnosis not present

## 2017-12-17 ENCOUNTER — Ambulatory Visit (INDEPENDENT_AMBULATORY_CARE_PROVIDER_SITE_OTHER): Payer: Medicare Other | Admitting: Family Medicine

## 2017-12-17 ENCOUNTER — Encounter: Payer: Self-pay | Admitting: Family Medicine

## 2017-12-17 VITALS — BP 120/60 | HR 57 | Temp 98.4°F | Resp 16 | Ht 64.0 in | Wt 191.0 lb

## 2017-12-17 DIAGNOSIS — J45909 Unspecified asthma, uncomplicated: Secondary | ICD-10-CM | POA: Diagnosis not present

## 2017-12-17 DIAGNOSIS — I5031 Acute diastolic (congestive) heart failure: Secondary | ICD-10-CM | POA: Diagnosis not present

## 2017-12-17 DIAGNOSIS — J189 Pneumonia, unspecified organism: Secondary | ICD-10-CM | POA: Diagnosis not present

## 2017-12-17 DIAGNOSIS — R42 Dizziness and giddiness: Secondary | ICD-10-CM | POA: Diagnosis not present

## 2017-12-17 DIAGNOSIS — R300 Dysuria: Secondary | ICD-10-CM | POA: Insufficient documentation

## 2017-12-17 LAB — CBC WITH DIFFERENTIAL/PLATELET
BASOS ABS: 0.1 10*3/uL (ref 0.0–0.1)
Basophils Relative: 1 % (ref 0.0–3.0)
EOS ABS: 0.2 10*3/uL (ref 0.0–0.7)
Eosinophils Relative: 1.9 % (ref 0.0–5.0)
HEMATOCRIT: 44.8 % (ref 36.0–46.0)
Hemoglobin: 14.8 g/dL (ref 12.0–15.0)
LYMPHS ABS: 1.3 10*3/uL (ref 0.7–4.0)
LYMPHS PCT: 16.9 % (ref 12.0–46.0)
MCHC: 33.1 g/dL (ref 30.0–36.0)
MCV: 91 fl (ref 78.0–100.0)
Monocytes Absolute: 0.7 10*3/uL (ref 0.1–1.0)
Monocytes Relative: 8.8 % (ref 3.0–12.0)
NEUTROS ABS: 5.7 10*3/uL (ref 1.4–7.7)
NEUTROS PCT: 71.4 % (ref 43.0–77.0)
PLATELETS: 131 10*3/uL — AB (ref 150.0–400.0)
RBC: 4.92 Mil/uL (ref 3.87–5.11)
RDW: 14.5 % (ref 11.5–15.5)
WBC: 7.9 10*3/uL (ref 4.0–10.5)

## 2017-12-17 LAB — POC URINALSYSI DIPSTICK (AUTOMATED)
BILIRUBIN UA: NEGATIVE
GLUCOSE UA: NEGATIVE
Ketones, UA: NEGATIVE
Leukocytes, UA: NEGATIVE
Nitrite, UA: NEGATIVE
RBC UA: NEGATIVE
SPEC GRAV UA: 1.025 (ref 1.010–1.025)
Urobilinogen, UA: 0.2 E.U./dL
pH, UA: 6 (ref 5.0–8.0)

## 2017-12-17 LAB — COMPREHENSIVE METABOLIC PANEL
ALT: 24 U/L (ref 0–35)
AST: 24 U/L (ref 0–37)
Albumin: 4.2 g/dL (ref 3.5–5.2)
Alkaline Phosphatase: 77 U/L (ref 39–117)
BILIRUBIN TOTAL: 0.7 mg/dL (ref 0.2–1.2)
BUN: 37 mg/dL — ABNORMAL HIGH (ref 6–23)
CO2: 32 meq/L (ref 19–32)
CREATININE: 1.2 mg/dL (ref 0.40–1.20)
Calcium: 9.7 mg/dL (ref 8.4–10.5)
Chloride: 99 mEq/L (ref 96–112)
GFR: 45.87 mL/min — ABNORMAL LOW (ref >60.00)
Glucose, Bld: 145 mg/dL — ABNORMAL HIGH (ref 70–99)
Potassium: 4.3 mEq/L (ref 3.5–5.1)
Sodium: 140 mEq/L (ref 135–145)
Total Protein: 7.4 g/dL (ref 6.0–8.3)

## 2017-12-17 MED ORDER — FLUTICASONE PROPIONATE HFA 110 MCG/ACT IN AERO
2.0000 | INHALATION_SPRAY | Freq: Two times a day (BID) | RESPIRATORY_TRACT | 12 refills | Status: DC
Start: 2017-12-17 — End: 2018-05-03

## 2017-12-17 NOTE — Assessment & Plan Note (Signed)
Check urine today

## 2017-12-17 NOTE — Progress Notes (Signed)
Patient ID: Margaret Dyer, female    DOB: 12/31/1934  Age: 83 y.o. MRN: [id number]   Pt  Subjective:  Subjective  HPI KYRSTAL MONTERROSA presents for f/u er for CAP and CHF.  Pt had stopped taking her lasix bid.  She is now taking it correctly CT chest done in hosp and needs to be repeated in 1 year.  Pt states sob is better.  She is c/o dysuria and is requesting her urine be checked.  Pt also was not able to afford her qvar so she did not get it and she is c/o some dizziness occasionally.    Review of Systems  Constitutional: Negative for activity change, appetite change, fatigue and unexpected weight change.  HENT: Negative.   Eyes: Negative for visual disturbance.  Respiratory: Negative for cough and shortness of breath.   Cardiovascular: Negative for chest pain and palpitations.  Genitourinary: Positive for dysuria and frequency. Negative for decreased urine volume, urgency, vaginal bleeding, vaginal discharge and vaginal pain.  Neurological: Positive for dizziness. Negative for tremors, syncope, speech difficulty, weakness, numbness and headaches.  Psychiatric/Behavioral: Negative for behavioral problems and dysphoric mood. The patient is not nervous/anxious.     History Past Medical History:  Diagnosis Date  . Asthma   . Blood transfusion   . Chronic diastolic CHF (congestive heart failure) ([place])   . CKD (chronic kidney disease), stage III ([place])   . Coronary artery calcification seen on CT scan   . Diabetes mellitus   . Goiter   . Hyperlipidemia   . Hypertension   . Hypothyroidism   . Liver cirrhosis ([place])   . NASH (nonalcoholic steatohepatitis)   . Obesity   . Osteopenia   . Pulmonary hypertension ([place])   . Renal cell carcinoma    a. prior nephrectomy.  . Right heart failure ([place])   . Thrombocytopenia ([place])     She has a past surgical history that includes Nephrectomy (09.2000); Appendectomy; Abdominal hysterectomy; Knee surgery; and Hemorrhoid surgery.   Her family  history includes COPD in her father; Cancer in her father and sister; Congestive Heart Failure in her father; Coronary artery disease in her unknown relative; Diabetes in her unknown relative; Hyperlipidemia in her unknown relative; Hypertension in her unknown relative; Kidney cancer in her father.She reports that she has quit smoking. she has never used smokeless tobacco. She reports that she does not drink alcohol or use drugs.  Current Outpatient Medications on File Prior to Visit  Medication Sig Dispense Refill  . albuterol (PROVENTIL HFA;VENTOLIN HFA) 108 (90 Base) MCG/ACT inhaler Inhale 2 puffs into the lungs every 6 (six) hours as needed for wheezing or shortness of breath. 1 Inhaler 2  . albuterol (PROVENTIL) (2.5 MG/3ML) 0.083% nebulizer solution Take 3 mLs (2.5 mg total) by nebulization every 6 (six) hours as needed for wheezing or shortness of breath. 150 mL 1  . amLODipine (NORVASC) 10 MG tablet Take 1 tablet (10 mg total) by mouth every morning. 30 tablet 4  . aspirin EC 81 MG tablet Take 1 tablet (81 mg total) by mouth daily. 90 tablet 3  . furosemide (LASIX) 40 MG tablet Take 1 tablet (40 mg total) by mouth 2 (two) times daily. 60 tablet 0  . gabapentin (NEURONTIN) 100 MG capsule take 1 capsule by mouth twice a day (Patient taking differently: take 100 mg by mouth twice a day as needed) 60 capsule 5  . hydrALAZINE (APRESOLINE) 50 MG tablet Take 0.5 tablets (25 mg total) by  mouth every 8 (eight) hours. 45 tablet 2  . insulin glargine (LANTUS) 100 UNIT/ML injection Inject 50 Units into the skin every evening.     . insulin glulisine (APIDRA) 100 UNIT/ML injection Inject 0.15 mLs (15 Units total) into the skin 3 (three) times daily with meals.    Marland Kitchen levothyroxine (SYNTHROID, LEVOTHROID) 25 MCG tablet take 1 tablet by mouth daily 30 tablet 5  . LORazepam (ATIVAN) 0.5 MG tablet Take 1 tablet (0.5 mg total) by mouth 2 (two) times daily as needed. 60 tablet 1  . metoprolol tartrate (LOPRESSOR)  25 MG tablet Take 1 tablet (25 mg total) by mouth 2 (two) times daily. 180 tablet 1  . oxybutynin (DITROPAN) 5 MG tablet Take 1 tablet (5 mg total) by mouth every morning. 90 tablet 3  . Potassium Chloride ER 20 MEQ TBCR Take 1 tablet by mouth 2 (two) times daily. 30 tablet 0  . traMADol (ULTRAM) 50 MG tablet 1 po q6h prn (Patient taking differently: Take 50 mg by mouth every 6 (six) hours as needed for moderate pain. ) 120 tablet 1  . ipratropium (ATROVENT HFA) 17 MCG/ACT inhaler Inhale 2 puffs into the lungs every 6 (six) hours as needed for wheezing. (Patient not taking: Reported on 12/17/2017) 1 Inhaler 12   No current facility-administered medications on file prior to visit.      Objective:  Objective  Physical Exam  Constitutional: She is oriented to person, place, and time. She appears well-developed and well-nourished.  HENT:  Head: Normocephalic and atraumatic.  Eyes: Conjunctivae and EOM are normal.  Neck: Normal range of motion. Neck supple. No JVD present. Carotid bruit is not present. No thyromegaly present.  Cardiovascular: Normal rate, regular rhythm and normal heart sounds.  No murmur heard. Pulmonary/Chest: Effort normal and breath sounds normal. No respiratory distress. She has no wheezes. She has no rales. She exhibits no tenderness.  Abdominal: Soft. She exhibits no distension and no mass. There is no tenderness. There is no rebound and no guarding.  Musculoskeletal: She exhibits no edema.  Neurological: She is alert and oriented to person, place, and time.  Psychiatric: She has a normal mood and affect.  Nursing note and vitals reviewed.  BP 120/60 (BP Location: Right Arm, Cuff Size: Normal)   Pulse (!) 57   Temp 98.4 F (36.9 C) (Oral)   Resp 16   Ht _0  (1.626 m)   Wt 191 lb (86.6 kg)   LMP  (LMP Unknown)   SpO2 (!) 57%   BMI 32.79 kg/m  Wt Readings from Last 3 Encounters:  12/17/17 191 lb (86.6 kg)  12/05/17 191 lb 9.3 oz (86.9 kg)  11/30/17 195 lb  (88.5 kg)     Lab Results  Component Value Date   WBC 8.2 12/03/2017   HGB 12.8 12/03/2017   HCT 39.8 12/03/2017   PLT 116 (L) 12/03/2017   GLUCOSE 154 (H) 12/05/2017   CHOL 160 05/28/2017   TRIG 145.0 05/28/2017   HDL 37.30 (L) 05/28/2017   LDLDIRECT 89.0 01/19/2015   LDLCALC 94 05/28/2017   ALT 24 11/30/2017   AST 24 11/30/2017   NA 138 12/05/2017   K 4.2 12/05/2017   CL 98 (L) 12/05/2017   CREATININE 0.95 12/05/2017   BUN 28 (H) 12/05/2017   CO2 32 12/05/2017   TSH 1.328 05/13/2017   INR 1.17 05/18/2017   HGBA1C 8.8 (H) 05/13/2017   MICROALBUR 52.5 (H) 05/28/2017    Ct Angio Chest Pe W  And/or Wo Contrast  Result Date: 12/01/2017 CLINICAL DATA:  83 year old female with shortness of breath. Concern for PE. EXAM: CT ANGIOGRAPHY CHEST WITH CONTRAST TECHNIQUE: Multidetector CT imaging of the chest was performed using the standard protocol during bolus administration of intravenous contrast. Multiplanar CT image reconstructions and MIPs were obtained to evaluate the vascular anatomy. CONTRAST:  172m ISOVUE-370 IOPAMIDOL (ISOVUE-370) INJECTION 76% COMPARISON:  Chest radiograph dated 12/01/2017 and chest CT dated 05/20/2017 FINDINGS: Cardiovascular: There is mild cardiomegaly. Multi vessel coronary vascular calcification. No pericardial effusion. Moderate atherosclerotic calcification of the thoracic aorta. Evaluation of the aorta is limited due to suboptimal enhancement and timing of the contrast. Evaluation of the pulmonary artery is is limited due to suboptimal opacification of the peripheral branches. There is no CT evidence of acute pulmonary artery embolus. Low attenuating area along the right lower lobe pulmonary artery (series 7, image 143) as well as adjacent to the right upper lobe pulmonary artery (series 7, image 63) appear chronic and extravascular. Mediastinum/Nodes: Mildly prominent bilateral hilar and mediastinal lymph nodes. No mediastinal fluid collection or hematoma.  Lungs/Pleura: Small bilateral pleural effusions. Bibasilar atelectasis/ scarring similar or slightly improved compared to the prior CT. Pneumonia is not entirely excluded. Bilateral hazy ground-glass density may represent atelectasis and less likely edema. Clinical correlation is recommended. There is a 5 mm right middle lobe pulmonary nodule (series 6, image 59). No pneumothorax. The central airways are patent. Upper Abdomen: Cirrhosis.  Bilateral adrenal nodularity. Musculoskeletal: Degenerative changes of the spine. Review of the MIP images confirms the above findings. IMPRESSION: 1. No CT evidence of acute pulmonary artery embolus. 2. Small bilateral pleural effusions and bibasilar atelectasis/scarring. Pneumonia is not excluded. Mild diffuse hazy airspace density may represent atelectasis and less likely edema. Clinical correlation is recommended. 3. A 5 mm right middle lobe pulmonary nodule. No follow-up needed if patient is low-risk. Non-contrast chest CT can be considered in 12 months if patient is high-risk. This recommendation follows the consensus statement: Guidelines for Management of Incidental Pulmonary Nodules Detected on CT Images: From the Fleischner Society 2017; Radiology 2017; 284:228-243. 4. Cardiomegaly with coronary vascular calcification. 5.  Aortic Atherosclerosis (ICD10-I70.0). Electronically Signed   By: AAnner CreteM.D.   On: 12/01/2017 04:04   Dg Chest Port 1 View  Result Date: 12/01/2017 CLINICAL DATA:  83year old female with shortness of breath. EXAM: PORTABLE CHEST 1 VIEW COMPARISON:  Chest CT dated 05/20/2017 FINDINGS: Linear atelectasis/ scarring in the left mid lung field. Left lung base densities likely atelectatic changes. Pneumonia is less likely but not excluded. The right lung is clear. There is no pleural effusion or pneumothorax. Mildly enlarged cardiac silhouette. There is atherosclerotic calcification of the aortic arch. No acute osseous pathology. IMPRESSION:  Left lung base atelectasis/ scarring. Pneumonia is less likely. Clinical correlation is recommended. Electronically Signed   By: AAnner CreteM.D.   On: 12/01/2017 00:59   EKG-- No change from previous   Assessment & Plan:  Plan  I have discontinued Brodie C. Bumpass's beclomethasone. I am also having her start on fluticasone. Additionally, I am having her maintain her insulin glargine, metoprolol tartrate, aspirin EC, levothyroxine, gabapentin, oxybutynin, traMADol, LORazepam, ipratropium, albuterol, albuterol, furosemide, insulin glulisine, Potassium Chloride ER, amLODipine, and hydrALAZINE.  Meds ordered this encounter  Medications  . fluticasone (FLOVENT HFA) 110 MCG/ACT inhaler    Sig: Inhale 2 puffs into the lungs 2 (two) times daily.    Dispense:  1 Inhaler    Refill:  12  Problem List Items Addressed This Visit      Unprioritized   Acute diastolic CHF (congestive heart failure) (HCC)    Weight stable per pt Sob resolved Pt is tired Check labs today due to taking lasix bid       Asthma    Pt unable to afford the qvar---  Change to flovent and call if still too expensive--- may be able to do pulmicort for neb Consider pulmonary       Relevant Medications   fluticasone (FLOVENT HFA) 110 MCG/ACT inhaler   Community acquired pneumonia    Resolved Recheck CT chest in 1 year      Relevant Medications   fluticasone (FLOVENT HFA) 110 MCG/ACT inhaler   Other Relevant Orders   CBC with Differential/Platelet   Comprehensive metabolic panel   POCT Urinalysis Dipstick (Automated)   Dizziness - Primary   Relevant Orders   EKG 12-Lead (Completed)   Dysuria    Check urine today      Relevant Orders   CBC with Differential/Platelet      Follow-up: Return in about 3 months (around 03/17/2018), or if symptoms worsen or fail to improve.  Ann Held, DO

## 2017-12-17 NOTE — Assessment & Plan Note (Signed)
Weight stable per pt Sob resolved Pt is tired Check labs today due to taking lasix bid

## 2017-12-17 NOTE — Assessment & Plan Note (Signed)
Resolved Recheck CT chest in 1 year

## 2017-12-17 NOTE — Patient Instructions (Signed)
Community-Acquired Pneumonia, Adult Pneumonia is an infection of the lungs. One type of pneumonia can happen while a person is in a hospital. A different type can happen when a person is not in a hospital (community-acquired pneumonia). It is easy for this kind to spread from person to person. It can spread to you if you breathe near an infected person who coughs or sneezes. Some symptoms include:  A dry cough.  A wet (productive) cough.  Fever.  Sweating.  Chest pain.  Follow these instructions at home:  Take over-the-counter and prescription medicines only as told by your doctor. ? Only take cough medicine if you are losing sleep. ? If you were prescribed an antibiotic medicine, take it as told by your doctor. Do not stop taking the antibiotic even if you start to feel better.  Sleep with your head and neck raised (elevated). You can do this by putting a few pillows under your head, or you can sleep in a recliner.  Do not use tobacco products. These include cigarettes, chewing tobacco, and e-cigarettes. If you need help quitting, ask your doctor.  Drink enough water to keep your pee (urine) clear or pale yellow. A shot (vaccine) can help prevent pneumonia. Shots are often suggested for:  People older than 81 years of age.  People older than 81 years of age: ? Who are having cancer treatment. ? Who have long-term (chronic) lung disease. ? Who have problems with their body's defense system (immune system).  You may also prevent pneumonia if you take these actions:  Get the flu (influenza) shot every year.  Go to the dentist as often as told.  Wash your hands often. If soap and water are not available, use hand sanitizer.  Contact a doctor if:  You have a fever.  You lose sleep because your cough medicine does not help. Get help right away if:  You are short of breath and it gets worse.  You have more chest pain.  Your sickness gets worse. This is very serious  if: ? You are an older adult. ? Your body's defense system is weak.  You cough up blood. This information is not intended to replace advice given to you by your health care provider. Make sure you discuss any questions you have with your health care provider. Document Released: 05/08/2008 Document Revised: 04/27/2016 Document Reviewed: 03/17/2015 Elsevier Interactive Patient Education  [employer].

## 2017-12-17 NOTE — Assessment & Plan Note (Signed)
Pt unable to afford the qvar---  Change to flovent and call if still too expensive--- may be able to do pulmicort for neb Consider pulmonary

## 2017-12-18 ENCOUNTER — Telehealth: Payer: Self-pay | Admitting: *Deleted

## 2017-12-18 ENCOUNTER — Other Ambulatory Visit: Payer: Self-pay

## 2017-12-18 NOTE — Patient Outreach (Signed)
[facility]) Care Management  12/18/2017  AUTYMN OMLOR [birth year] [id number]     EMMI-HF RED ON EMMI ALERT Day # 12 Date: 12/17/17 Red Alert Reason: "New/worsening problems? Yes"   Outreach attempt # 1 to patient. No answer at present. RN CM left HIPAA compliant voicemail message along with contact info.        Plan: RN CM has already sent unsuccessful outreach letter and case closure pending if no response from patient.    Enzo Montgomery, RN,BSN,CCM [place] Management Telephonic Care Management Coordinator Direct Phone: [phone] Toll Free: [phone] Fax: [phone]

## 2017-12-18 NOTE — Telephone Encounter (Signed)
Received Physician Orders from [facility]; forwarded to provider/SLS 01/15

## 2017-12-19 ENCOUNTER — Other Ambulatory Visit: Payer: Self-pay | Admitting: Family Medicine

## 2017-12-19 DIAGNOSIS — N183 Chronic kidney disease, stage 3 (moderate): Secondary | ICD-10-CM | POA: Diagnosis not present

## 2017-12-19 DIAGNOSIS — I2510 Atherosclerotic heart disease of native coronary artery without angina pectoris: Secondary | ICD-10-CM | POA: Diagnosis not present

## 2017-12-19 DIAGNOSIS — K7460 Unspecified cirrhosis of liver: Secondary | ICD-10-CM | POA: Diagnosis not present

## 2017-12-19 DIAGNOSIS — E1122 Type 2 diabetes mellitus with diabetic chronic kidney disease: Secondary | ICD-10-CM | POA: Diagnosis not present

## 2017-12-19 DIAGNOSIS — I5033 Acute on chronic diastolic (congestive) heart failure: Secondary | ICD-10-CM | POA: Diagnosis not present

## 2017-12-19 DIAGNOSIS — I130 Hypertensive heart and chronic kidney disease with heart failure and stage 1 through stage 4 chronic kidney disease, or unspecified chronic kidney disease: Secondary | ICD-10-CM | POA: Diagnosis not present

## 2017-12-20 ENCOUNTER — Other Ambulatory Visit: Payer: Self-pay

## 2017-12-20 NOTE — Patient Outreach (Signed)
[facility]) Care Management  12/20/2017  SWATHI DAUPHIN 1935-07-11 [id number]  EMMI-HF RED ON EMMI ALERT Day # 14 Date: 12/20/17 Red Alert Reason: Weighed themselves today; NO Call attempt  Telephone call to patient regarding EMMI heart failure red alert. Unable to reach patient. HIPAA compliant voice message left with call back phone number.   PLAN; Outreach letter sent by [facility] on 12/11/17.  Case closure pending if no response.   Quinn Plowman RN,BSN,CCM [facility] Telephonic  [phone]

## 2017-12-21 ENCOUNTER — Telehealth: Payer: Self-pay | Admitting: *Deleted

## 2017-12-21 NOTE — Telephone Encounter (Signed)
Received Physician Orders from [facility]; forwarded to provider/SLS 01/18

## 2017-12-25 ENCOUNTER — Other Ambulatory Visit: Payer: Self-pay

## 2017-12-25 NOTE — Patient Outreach (Signed)
[facility]) Care Management  12/25/2017  BRETTA FEES 1935/01/22 [id number]   EMMI-HF RED ON EMMI ALERT Day # 12 Date: 12/17/17 Red Alert Reason: "New/worsening problems? Yes"   Multiple attempts to establish contact with patient without success. No response from letter mailed to patient. Case is being closed at this time.     Plan: RN CM will notify [facility] administrative assistant of case status.   Enzo Montgomery, RN,BSN,CCM [place] Management Telephonic Care Management Coordinator Direct Phone: [phone] Toll Free: [phone] Fax: [phone]

## 2017-12-27 ENCOUNTER — Telehealth: Payer: Self-pay | Admitting: *Deleted

## 2017-12-27 NOTE — Telephone Encounter (Signed)
Received Home Health Certification and Plan of Care; forwarded to provider/SLS 01/24

## 2017-12-28 DIAGNOSIS — I5033 Acute on chronic diastolic (congestive) heart failure: Secondary | ICD-10-CM | POA: Diagnosis not present

## 2017-12-28 DIAGNOSIS — K7460 Unspecified cirrhosis of liver: Secondary | ICD-10-CM | POA: Diagnosis not present

## 2017-12-28 DIAGNOSIS — E1122 Type 2 diabetes mellitus with diabetic chronic kidney disease: Secondary | ICD-10-CM | POA: Diagnosis not present

## 2017-12-28 DIAGNOSIS — I2510 Atherosclerotic heart disease of native coronary artery without angina pectoris: Secondary | ICD-10-CM | POA: Diagnosis not present

## 2017-12-28 DIAGNOSIS — N183 Chronic kidney disease, stage 3 (moderate): Secondary | ICD-10-CM | POA: Diagnosis not present

## 2017-12-28 DIAGNOSIS — I130 Hypertensive heart and chronic kidney disease with heart failure and stage 1 through stage 4 chronic kidney disease, or unspecified chronic kidney disease: Secondary | ICD-10-CM | POA: Diagnosis not present

## 2018-01-04 ENCOUNTER — Telehealth: Payer: Self-pay | Admitting: *Deleted

## 2018-01-04 DIAGNOSIS — E1122 Type 2 diabetes mellitus with diabetic chronic kidney disease: Secondary | ICD-10-CM | POA: Diagnosis not present

## 2018-01-04 DIAGNOSIS — K7460 Unspecified cirrhosis of liver: Secondary | ICD-10-CM | POA: Diagnosis not present

## 2018-01-04 DIAGNOSIS — I5033 Acute on chronic diastolic (congestive) heart failure: Secondary | ICD-10-CM | POA: Diagnosis not present

## 2018-01-04 DIAGNOSIS — I2510 Atherosclerotic heart disease of native coronary artery without angina pectoris: Secondary | ICD-10-CM | POA: Diagnosis not present

## 2018-01-04 DIAGNOSIS — N183 Chronic kidney disease, stage 3 (moderate): Secondary | ICD-10-CM | POA: Diagnosis not present

## 2018-01-04 DIAGNOSIS — I130 Hypertensive heart and chronic kidney disease with heart failure and stage 1 through stage 4 chronic kidney disease, or unspecified chronic kidney disease: Secondary | ICD-10-CM | POA: Diagnosis not present

## 2018-01-04 NOTE — Telephone Encounter (Signed)
Received Home Health Certification and Plan of Care; forwarded to provider/SLS 02/01

## 2018-01-09 NOTE — Telephone Encounter (Signed)
Plan of Care signed and faxed to [facility] at [phone]. Form sent for scanning.

## 2018-01-16 ENCOUNTER — Ambulatory Visit: Payer: Self-pay | Admitting: Neurology

## 2018-01-24 ENCOUNTER — Telehealth: Payer: Self-pay | Admitting: Family Medicine

## 2018-01-24 ENCOUNTER — Other Ambulatory Visit: Payer: Self-pay | Admitting: Family Medicine

## 2018-01-24 DIAGNOSIS — F419 Anxiety disorder, unspecified: Secondary | ICD-10-CM

## 2018-01-24 NOTE — Telephone Encounter (Unsigned)
Copied from [place] [id number]. Topic: Quick Communication - Rx Refill/Question >> Jan 24, 2018 12:38 PM Carolyn Stare wrote: Medication   LORazepam (ATIVAN) 0.5 MG tablet     dealing with the death of her son    Preferred Pharmacy  [facility] [address]     Agent: Please be advised that RX refills may take up to 3 business days. We ask that you follow-up with your pharmacy.

## 2018-01-24 NOTE — Telephone Encounter (Signed)
lorazepam refill Last OV: 12/17/17 Last Refill:12/17/17 Pharmacy: [place], [place]

## 2018-01-25 ENCOUNTER — Other Ambulatory Visit: Payer: Self-pay | Admitting: Family Medicine

## 2018-01-25 DIAGNOSIS — F419 Anxiety disorder, unspecified: Secondary | ICD-10-CM

## 2018-01-25 MED ORDER — LORAZEPAM 0.5 MG PO TABS: 0.5000 mg | ORAL_TABLET | Freq: Two times a day (BID) | ORAL | 1 refills | Status: DC | PRN

## 2018-01-25 NOTE — Telephone Encounter (Signed)
Caller name: Relation to pt: Call back number: [phone]/ [phone] Pharmacy: [place], [place] [phone] (Phone) [phone] (Fax)     Reason for call:  Patient states her grandson committed suicide on Sunday and would like PCP to fill LORazepam (ATIVAN) 0.5 MG tablet  today, offered my condolences and informed her PCP is aware of refill request, please advise

## 2018-01-25 NOTE — Telephone Encounter (Signed)
done

## 2018-01-25 NOTE — Telephone Encounter (Signed)
Left message on machine that rx was sent in.

## 2018-01-25 NOTE — Telephone Encounter (Signed)
Patient requesting refill for lorazepam  Database ran and is on your desk for review.   Last filled per database: 12/17/17 Last written: 11/09/17 Last ov: 12/17/17 Next ov: none Contract: 05/28/18 UDS: none   You are also prescribing tramadol as well

## 2018-02-05 ENCOUNTER — Other Ambulatory Visit: Payer: Self-pay | Admitting: Internal Medicine

## 2018-02-07 ENCOUNTER — Other Ambulatory Visit: Payer: Self-pay | Admitting: Internal Medicine

## 2018-02-07 MED ORDER — POTASSIUM CHLORIDE ER 20 MEQ PO TBCR: 1.0000 | EXTENDED_RELEASE_TABLET | Freq: Two times a day (BID) | ORAL | 1 refills | Status: DC

## 2018-02-07 MED ORDER — POTASSIUM CHLORIDE ER 20 MEQ PO TBCR: 1.0000 | EXTENDED_RELEASE_TABLET | Freq: Two times a day (BID) | ORAL | 0 refills | Status: DC

## 2018-02-07 NOTE — Addendum Note (Signed)
Addended by: Venetia Maxon on: 02/07/2018 03:38 PM   Modules accepted: Orders

## 2018-02-07 NOTE — Telephone Encounter (Signed)
*  STAT* If patient is at the pharmacy, call can be transferred to refill team.   1. Which medications need to be refilled? (please list name of each medication and dose if known) Potassium ( needs a new prescription sent )   2. Which pharmacy/location (including street and city if local pharmacy) is medication to be sent to?[facility]   3. Do they need a 30 day or 90 day supply?[place]

## 2018-02-12 ENCOUNTER — Telehealth: Payer: Self-pay

## 2018-02-12 NOTE — Telephone Encounter (Signed)
We need to see pt to order diagnostic mammogram

## 2018-02-12 NOTE — Telephone Encounter (Signed)
Please advise.

## 2018-02-12 NOTE — Telephone Encounter (Signed)
Copied from [place]. Topic: Referral - Request >> Feb 12, 2018 12:59 PM Margaret Dyer wrote: Reason for CRM: Need order for Diagnostic Mammogram, having pain in right breast. Please send to [facility], has appt on Monday

## 2018-02-12 NOTE — Telephone Encounter (Signed)
Patient made appt for Friday 215pm

## 2018-02-15 ENCOUNTER — Encounter: Payer: Self-pay | Admitting: Family Medicine

## 2018-02-15 ENCOUNTER — Ambulatory Visit (INDEPENDENT_AMBULATORY_CARE_PROVIDER_SITE_OTHER): Payer: Medicare Other | Admitting: Family Medicine

## 2018-02-15 VITALS — BP 134/60 | HR 76 | Temp 98.3°F | Resp 16 | Ht 63.5 in | Wt 193.4 lb

## 2018-02-15 DIAGNOSIS — N644 Mastodynia: Secondary | ICD-10-CM | POA: Diagnosis not present

## 2018-02-15 DIAGNOSIS — M1990 Unspecified osteoarthritis, unspecified site: Secondary | ICD-10-CM

## 2018-02-15 DIAGNOSIS — Z79899 Other long term (current) drug therapy: Secondary | ICD-10-CM

## 2018-02-15 MED ORDER — TRAMADOL HCL 50 MG PO TABS: 50.0000 mg | ORAL_TABLET | Freq: Four times a day (QID) | ORAL | 1 refills | Status: DC | PRN
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
## 2018-02-15 NOTE — Patient Instructions (Signed)
Breast Tenderness Breast tenderness is a common problem for women of all ages. Breast tenderness may cause mild discomfort to severe pain. The pain usually comes and goes in association with your menstrual cycle, but it can be constant. Breast tenderness has many possible causes, including hormone changes and some medicines. Your health care provider may order tests, such as a mammogram or an ultrasound, to check for any unusual findings. Having breast tenderness usually does not mean that you have breast cancer. Follow these instructions at home: Sometimes, reassurance that you do not have breast cancer is all that is needed. In general, follow these home care instructions: Managing pain and discomfort  If directed, apply ice to the area: ? Put ice in a plastic bag. ? Place a towel between your skin and the bag. ? Leave the ice on for 20 minutes, 2-3 times a day.  Make sure you are wearing a supportive bra, especially during exercise. You may also want to wear a supportive bra while sleeping if your breasts are very tender. Medicines  Take over-the-counter and prescription medicines only as told by your health care provider. If the cause of your pain is infection, you may be prescribed an antibiotic medicine.  If you were prescribed an antibiotic, take it as told by your health care provider. Do not stop taking the antibiotic even if you start to feel better. General instructions  Your health care provider may recommend that you reduce the amount of fat in your diet. You can do this by: ? Limiting fried foods. ? Cooking foods using methods, such as baking, boiling, grilling, and broiling.  Decrease the amount of caffeine in your diet. You can do this by drinking more water and choosing caffeine-free options.  Keep a log of the days and times when your breasts are most tender.  Ask your health care provider how to do breast exams at home. This will help you notice if you have an unusual  growth or lump. Contact a health care provider if:  Any part of your breast is hard, red, and hot to the touch. This may be a sign of infection.  You are not breastfeeding and you have fluid, especially blood or pus, coming out of your nipples.  You have a fever.  You have a new or painful lump in your breast that remains after your menstrual period ends.  Your pain does not improve or it gets worse.  Your pain is interfering with your daily activities. This information is not intended to replace advice given to you by your health care provider. Make sure you discuss any questions you have with your health care provider. Document Released: 11/02/2008 Document Revised: 08/18/2016 Document Reviewed: 08/18/2016 Elsevier Interactive Patient Education  [employer].

## 2018-02-18 ENCOUNTER — Encounter: Payer: Self-pay | Admitting: Family Medicine

## 2018-02-18 ENCOUNTER — Telehealth: Payer: Self-pay | Admitting: *Deleted

## 2018-02-18 DIAGNOSIS — N644 Mastodynia: Secondary | ICD-10-CM | POA: Diagnosis not present

## 2018-02-18 DIAGNOSIS — Z853 Personal history of malignant neoplasm of breast: Secondary | ICD-10-CM | POA: Diagnosis not present

## 2018-02-18 DIAGNOSIS — N6311 Unspecified lump in the right breast, upper outer quadrant: Secondary | ICD-10-CM | POA: Diagnosis not present

## 2018-02-18 DIAGNOSIS — Z803 Family history of malignant neoplasm of breast: Secondary | ICD-10-CM | POA: Diagnosis not present

## 2018-02-18 LAB — PAIN MGMT, PROFILE 8 W/CONF, U
6 Acetylmorphine: NEGATIVE ng/mL (ref <10)
Alcohol Metabolites: NEGATIVE ng/mL (ref <500)
Amphetamines: NEGATIVE ng/mL (ref <500)
BENZODIAZEPINES: NEGATIVE ng/mL (ref <100)
Buprenorphine, Urine: NEGATIVE ng/mL (ref <5)
COCAINE METABOLITE: NEGATIVE ng/mL (ref <150)
CREATININE: 83.1 mg/dL
MARIJUANA METABOLITE: NEGATIVE ng/mL (ref <20)
MDMA: NEGATIVE ng/mL (ref <500)
Opiates: NEGATIVE ng/mL (ref <100)
Oxidant: NEGATIVE ug/mL (ref <200)
Oxycodone: NEGATIVE ng/mL (ref <100)
PH: 6.33 (ref 4.5–9.0)

## 2018-02-18 LAB — PAIN MGMT, TRAMADOL W/MEDMATCH, U
Desmethyltramadol: 445 ng/mL — ABNORMAL HIGH (ref <100)
Tramadol: 855 ng/mL — ABNORMAL HIGH (ref <100)

## 2018-02-18 LAB — HM MAMMOGRAPHY

## 2018-02-18 NOTE — Telephone Encounter (Signed)
Received Physician Orders from [facility]; forwarded to provider/SLS 03/18

## 2018-02-19 NOTE — Telephone Encounter (Signed)
Received signed order from provider; faxed to [facility] 03/19

## 2018-02-20 ENCOUNTER — Telehealth: Payer: Self-pay | Admitting: *Deleted

## 2018-02-20 NOTE — Telephone Encounter (Signed)
Received Mammogram results from [facility], they will have performed Ultrasound and we will receive separate report; forwarded to provider/SLS 03/20

## 2018-02-26 ENCOUNTER — Ambulatory Visit: Payer: Self-pay | Admitting: *Deleted

## 2018-02-26 NOTE — Telephone Encounter (Signed)
Valtrex 1000 mg tid x 7 days

## 2018-02-26 NOTE — Telephone Encounter (Signed)
Pt reports right breast pain x 3 weeks, worsening. Seen in office Friday for symptoms. States pain "goes from back, around right breast, underneath breast and sometimes nipple;goes right through me." States unable to sleep at night, pain awakens her as well... 7/10 presently. Is taking tramadol once a day with minimal relief "very little." States no rash present, no fever. Pt states she thinks the pain is from "shingles without the rash".  She is requesting "something be called in for shingles just to see if it helps." States she cannot get to office appt for 2-3 days. Please advise, [phone]. Reason for Disposition . [1] Breast pain AND [2] cause is not known  Answer Assessment - Initial Assessment Questions 1. SYMPTOM: "What's the main symptom you're concerned about?"  (e.g., lump, pain, rash, nipple discharge)     Pain right breast, 7/10, constant 2. LOCATION: "Where is the _______ located?"     From back,runs around right side of breast, underneath breast and sometimes to nipple 3. ONSET: "When did ________  start?"     3 weeks ago, worsening; Seen in office Friday. 4. PRIOR HISTORY: "Do you have any history of prior problems with your breasts?" (e.g., lumps, cancer, fibrocystic breast disease)     Had similar symptoms 1 year ago, cause unknown. 5. CAUSE: "What do you think is causing this symptom?"     Shingles without the rash 6. OTHER SYMPTOMS: "Do you have any other symptoms?" (e.g., fever, breast pain, redness or rash, nipple discharge) No  Protocols used: BREAST [facility]

## 2018-02-28 ENCOUNTER — Telehealth: Payer: Self-pay | Admitting: Family Medicine

## 2018-02-28 MED ORDER — VALACYCLOVIR HCL 1 G PO TABS: 1000.0000 mg | ORAL_TABLET | Freq: Two times a day (BID) | ORAL | 0 refills | Status: DC

## 2018-02-28 NOTE — Telephone Encounter (Signed)
Rx filled today

## 2018-02-28 NOTE — Telephone Encounter (Signed)
[facility] called because patient wanted rx for shingles.  Rx sent in.  [facility] notified patient that rx was sent in.

## 2018-02-28 NOTE — Addendum Note (Signed)
Addended by: Kem Boroughs D on: 02/28/2018 11:00 AM   Modules accepted: Orders

## 2018-02-28 NOTE — Telephone Encounter (Signed)
Copied from [place]. Topic: Quick Communication - Rx Refill/Question >> Feb 28, 2018  3:44 PM Oliver Pila B wrote: Medication: valACYclovir (VALTREX) 1000 MG tablet [[id number]]  Has the patient contacted their pharmacy? Yes.   (Agent: If no, request that the patient contact the pharmacy for the refill.) Preferred Pharmacy (with phone number or street name): [facility] Agent: Please be advised that RX refills may take up to 3 business days. We ask that you follow-up with your pharmacy.

## 2018-03-01 NOTE — Telephone Encounter (Signed)
Patient said that pharmacy has told them they do not have it. Please re-send

## 2018-03-01 NOTE — Telephone Encounter (Signed)
Call to pharmacy- they do not have it- called Rx in as written in chart. Call to patient- left message- Rx is being filled for her pick up.

## 2018-03-01 NOTE — Telephone Encounter (Signed)
[id number] [address], [place] [phone] (Phone) [phone] (Fax)

## 2018-03-06 ENCOUNTER — Other Ambulatory Visit: Payer: Self-pay | Admitting: Internal Medicine

## 2018-03-06 NOTE — Telephone Encounter (Signed)
REFILL 

## 2018-03-12 ENCOUNTER — Telehealth: Payer: Self-pay | Admitting: *Deleted

## 2018-03-12 NOTE — Telephone Encounter (Signed)
Copied from [place] [id number]. Topic: Referral - Request >> Mar 12, 2018 10:57 AM Scherrie Gerlach wrote: Reason for CRM: pt states she is still having pain in her right breast.  Pt states she has seen the dr for this and wants to know if she feels a MRI might be of help. Pt states no one able to know what this pain is, and now it has been about a month. Pt states she doesn't know what else to do either. Pt's daughter number if you cannot reach her on the cell number first.  Daughter:  [phone]

## 2018-03-13 ENCOUNTER — Ambulatory Visit: Payer: Self-pay

## 2018-03-13 ENCOUNTER — Other Ambulatory Visit: Payer: Self-pay | Admitting: Family Medicine

## 2018-03-13 DIAGNOSIS — N644 Mastodynia: Secondary | ICD-10-CM

## 2018-03-13 NOTE — Telephone Encounter (Signed)
Pt called to check on results of mammogram that she had 2 weeks agoat [facility] in [place]. She is concerned about the pain that she has had for a month and is wanting an MRI to find out the cause.  It involves the right breast and under the right breast to the side and she said that it hurts if she touches it.  Pt stated that she was frustrated that no one can help her. She is taking Tramadol once per day. Rates the pain a 6-7 . Pt concerned that she will get addicted to the pain med or constipated. Advised pt to take as instructed and to increase fiber in her diet and to take Miralax every am  Until she has a BM. Pt is requesting a call back. She is very concerned and frustrated. Call back number is [phone] then if no answer to call her daughter [name] at [phone]. Routing message to office.  Reason for Disposition . [1] Breast pain AND [2] cause is not known    Pt was seen in office for issue 02/15/18.  Answer Assessment - Initial Assessment Questions 1. SYMPTOM: "What's the main symptom you're concerned about?"  (e.g., lump, pain, rash, nipple discharge)    Right breast pain that hurts under her breast that pain goes to her side. She states that it hurts when she touches it 2. LOCATION: "Where is the _______ located?"     Right breast 3. ONSET: "When did ________  start?"     1 month ago 4. PRIOR HISTORY: "Do you have any history of prior problems with your breasts?" (e.g., lumps, cancer, fibrocystic breast disease)     no 5. CAUSE: "What do you think is causing this symptom?"     Pt does not know  6. OTHER SYMPTOMS: "Do you have any other symptoms?" (e.g., fever, breast pain, redness or rash, nipple discharge)     no 7. PREGNANCY-BREASTFEEDING: "Is there any chance you are pregnant?" "When was your last menstrual period?" "Are you breastfeeding?"     n/a  Protocols used: BREAST [facility]

## 2018-03-13 NOTE — Telephone Encounter (Signed)
Pt is calling about her issue with her breats, please call back today

## 2018-03-13 NOTE — Telephone Encounter (Signed)
Pt called to check status of the request, call pt to advise when possible

## 2018-03-13 NOTE — Telephone Encounter (Signed)
I ordered diagnostic mammogram 0-- she has not gone

## 2018-03-13 NOTE — Telephone Encounter (Signed)
Patient is calling back about her breast pain stating that she did indeed have her mammogram about 2 weeks ago. Please call her back at earliest convenience.

## 2018-03-14 ENCOUNTER — Encounter: Payer: Self-pay | Admitting: *Deleted

## 2018-03-14 ENCOUNTER — Other Ambulatory Visit: Payer: Self-pay | Admitting: *Deleted

## 2018-03-14 DIAGNOSIS — N6310 Unspecified lump in the right breast, unspecified quadrant: Secondary | ICD-10-CM

## 2018-03-14 NOTE — Telephone Encounter (Signed)
Patient notified of results.  Since she is still having pain Dr. Etter Sjogren is sending her to general surgery.  Referral placed and copies of reports sent to patient.

## 2018-03-25 ENCOUNTER — Other Ambulatory Visit: Payer: Self-pay | Admitting: Internal Medicine

## 2018-03-25 NOTE — Telephone Encounter (Signed)
Rx request sent to pharmacy.

## 2018-03-27 ENCOUNTER — Telehealth: Payer: Self-pay | Admitting: *Deleted

## 2018-03-27 NOTE — Telephone Encounter (Signed)
Copied from [place] [id number]. Topic: Referral - Request >> Mar 12, 2018 10:57 AM Scherrie Gerlach wrote: Reason for CRM: pt states she is still having pain in her right breast.  Pt states she has seen the dr for this and wants to know if she feels a MRI might be of help. Pt states no one able to know what this pain is, and now it has been about a month. Pt states she doesn't know what else to do either. Pt's daughter number if you cannot reach her on the cell number first.  Daughter:  [phone]  >> Mar 26, 2018  3:27 PM Ahmed Prima L wrote: Patient would like a call back in regards to this. She said that nobody has called her back. [phone]

## 2018-03-27 NOTE — Telephone Encounter (Signed)
Sent staff message to [place] to check message.

## 2018-03-27 NOTE — Telephone Encounter (Signed)
Patient stated that she has not heard anything from [facility] about an appt.

## 2018-03-29 ENCOUNTER — Ambulatory Visit: Payer: Medicare Other | Admitting: Internal Medicine

## 2018-04-03 NOTE — Telephone Encounter (Signed)
Not sure if you got staff message can you check to see if this [facility] scheduled patient?

## 2018-04-11 NOTE — Telephone Encounter (Signed)
Pt scheduled

## 2018-04-17 ENCOUNTER — Ambulatory Visit: Payer: Medicare Other | Admitting: Neurology

## 2018-04-19 DIAGNOSIS — M1711 Unilateral primary osteoarthritis, right knee: Secondary | ICD-10-CM | POA: Diagnosis not present

## 2018-04-19 DIAGNOSIS — M25561 Pain in right knee: Secondary | ICD-10-CM | POA: Diagnosis not present

## 2018-04-22 DIAGNOSIS — N644 Mastodynia: Secondary | ICD-10-CM | POA: Diagnosis not present

## 2018-04-25 ENCOUNTER — Other Ambulatory Visit: Payer: Self-pay | Admitting: Internal Medicine

## 2018-05-03 ENCOUNTER — Ambulatory Visit (INDEPENDENT_AMBULATORY_CARE_PROVIDER_SITE_OTHER): Payer: Medicare Other | Admitting: Internal Medicine

## 2018-05-03 ENCOUNTER — Encounter: Payer: Self-pay | Admitting: Internal Medicine

## 2018-05-03 VITALS — BP 122/70 | HR 61 | Ht 63.5 in | Wt 192.0 lb

## 2018-05-03 DIAGNOSIS — N183 Chronic kidney disease, stage 3 unspecified: Secondary | ICD-10-CM

## 2018-05-03 DIAGNOSIS — R42 Dizziness and giddiness: Secondary | ICD-10-CM | POA: Diagnosis not present

## 2018-05-03 DIAGNOSIS — Z79899 Other long term (current) drug therapy: Secondary | ICD-10-CM | POA: Diagnosis not present

## 2018-05-03 DIAGNOSIS — I5032 Chronic diastolic (congestive) heart failure: Secondary | ICD-10-CM | POA: Diagnosis not present

## 2018-05-03 DIAGNOSIS — I272 Pulmonary hypertension, unspecified: Secondary | ICD-10-CM | POA: Diagnosis not present

## 2018-05-03 NOTE — Patient Instructions (Signed)
Your physician recommends that you return for lab work TODAY   Your physician wants you to follow-up in: ONE YEAR with Dr. Debara Pickett. You will receive a reminder letter in the mail two months in advance. If you don't receive a letter, please call our office to schedule the follow-up appointment.

## 2018-05-03 NOTE — Progress Notes (Signed)
OFFICE NOTE  Chief Complaint:  Follow-up heart failure  Primary Care Physician: Carollee Herter, Alferd Apa, DO  HPI:  Margaret Dyer is a 83 y.o. female with a past medial history significant for tobacco abuse, hypertension, dyslipidemia, cirrhosis, diabetes type 2 and lower extremity edema. She presented the hospital in June 2018 with several weeks of dyspnea on exertion and 10-15 pound weight gain. She had bilateral pleural effusions and an elevated BNP of 219. Echo showed an EF of 60-65% with grade 2 diastolic dysfunction. She underwent right heart catheterization by Dr. Ellyn Hack on 05/18/2017 which showed severe diastolic heart failure and an elevated pulmonary capillary wedge pressure 32 mmHg. There is also felt to be component of pulmonary arterial hypertension. Ultimately she had a [employer] which showed no reversible ischemia. This was performed due to history of coronary calcifications. She also has chronic kidney disease and we preferred to avoid any contrast dye exposure such as with left heart catheterization. She was seen in follow-up by Sharrell Ku, PA-C in July who ordered her stress test and felt that she was doing well from cardiac standpoint. Her weight was 203 pounds. Today it's 200 pounds. She continues to take her diuretic and feels like she is doing well without any worsening shortness of breath.  05/03/2018  Margaret Dyer returns today for follow-up.  Overall she is doing well.  Her weight is significantly lower than it was during the hospital.  Now down about 8 to 10 pounds.  She has been getting some occasional dizziness.  This could be worse with change in position or bending over.  She has a history of neuropathy which can contribute to this and does have a new patient visit with neurology at the end of June.  She is also on twice daily Lasix and I wonder if she could be somewhat over diuresed.  She has no issues with edema.  She is also had some concerns about knots in her legs  which could be varicose veins.  There are signs of bilateral lower extremity venous insufficiency.  PMHx:  Past Medical History:  Diagnosis Date  . Asthma   . Blood transfusion   . Chronic diastolic CHF (congestive heart failure) ([place])   . CKD (chronic kidney disease), stage III ([place])   . Coronary artery calcification seen on CT scan   . Diabetes mellitus   . Goiter   . Hyperlipidemia   . Hypertension   . Hypothyroidism   . Liver cirrhosis ([place])   . NASH (nonalcoholic steatohepatitis)   . Obesity   . Osteopenia   . Pulmonary hypertension ([place])   . Renal cell carcinoma    a. prior nephrectomy.  . Right heart failure ([place])   . Thrombocytopenia ([place])     Past Surgical History:  Procedure Laterality Date  . ABDOMINAL HYSTERECTOMY    . APPENDECTOMY    . HEMORRHOID SURGERY    . KNEE SURGERY    . NEPHRECTOMY  09.2000    FAMHx:  Family History  Problem Relation Age of Onset  . Cancer Sister        bladder  . Kidney cancer Father   . Cancer Father        renal  . COPD Father   . Congestive Heart Failure Father   . Coronary artery disease Unknown   . Diabetes Unknown   . Hyperlipidemia Unknown   . Hypertension Unknown   . Rheumatologic disease Neg Hx     SOCHx:  reports that she has quit smoking. She has never used smokeless tobacco. She reports that she does not drink alcohol or use drugs.  ALLERGIES:  Allergies  Allergen Reactions  . Cefuroxime Axetil     Upset stomach   . Ciprofloxacin     Upset stomach    ROS: Pertinent items noted in HPI and remainder of comprehensive ROS otherwise negative.  HOME MEDS: Current Outpatient Medications on File Prior to Visit  Medication Sig Dispense Refill  . albuterol (PROVENTIL) (2.5 MG/3ML) 0.083% nebulizer solution Take 3 mLs (2.5 mg total) by nebulization every 6 (six) hours as needed for wheezing or shortness of breath. 150 mL 1  . amLODipine (NORVASC) 10 MG tablet TAKE 1 TABLET BY MOUTH EVERY MORNING 30 tablet  3  . aspirin EC 81 MG tablet Take 1 tablet (81 mg total) by mouth daily. 90 tablet 3  . furosemide (LASIX) 40 MG tablet take 1 tablet by mouth twice a day 60 tablet 3  . gabapentin (NEURONTIN) 100 MG capsule take 1 capsule by mouth twice a day (Patient taking differently: take 100 mg by mouth twice a day as needed) 60 capsule 5  . hydrALAZINE (APRESOLINE) 50 MG tablet Take 50 mg by mouth 2 (two) times daily. Taking one tablet in the morning and one half tablet in the evening    . insulin glargine (LANTUS) 100 UNIT/ML injection Inject 50 Units into the skin every evening.     . insulin glulisine (APIDRA) 100 UNIT/ML injection Inject 0.15 mLs (15 Units total) into the skin 3 (three) times daily with meals.    Marland Kitchen levothyroxine (SYNTHROID, LEVOTHROID) 25 MCG tablet take 1 tablet by mouth daily 30 tablet 5  . LORazepam (ATIVAN) 0.5 MG tablet Take 1 tablet (0.5 mg total) by mouth 2 (two) times daily as needed. 60 tablet 1  . metoprolol tartrate (LOPRESSOR) 25 MG tablet TAKE 1 TABLET BY MOUTH TWICE DAILY 180 tablet 1  . oxybutynin (DITROPAN) 5 MG tablet Take 1 tablet (5 mg total) by mouth every morning. 90 tablet 3  . potassium chloride SA (K-DUR,KLOR-CON) 20 MEQ tablet Take 20 mEq by mouth every evening.    . traMADol (ULTRAM) 50 MG tablet Take 1 tablet (50 mg total) by mouth every 6 (six) hours as needed for moderate pain. 60 tablet 1   No current facility-administered medications on file prior to visit.     LABS/IMAGING: No results found for this or any previous visit (from the past 48 hour(s)). No results found.  LIPID PANEL:    Component Value Date/Time   CHOL 160 05/28/2017 1630   TRIG 145.0 05/28/2017 1630   HDL 37.30 (L) 05/28/2017 1630   CHOLHDL 4 05/28/2017 1630   VLDL 29.0 05/28/2017 1630   LDLCALC 94 05/28/2017 1630   LDLDIRECT 89.0 01/19/2015 1659     WEIGHTS: Wt Readings from Last 3 Encounters:  05/03/18 192 lb (87.1 kg)  02/15/18 193 lb 6.4 oz (87.7 kg)  12/17/17 191 lb  (86.6 kg)    VITALS: BP 122/70   Pulse 61   Ht 5' 3.5" (1.613 m)   Wt 192 lb (87.1 kg)   LMP  (LMP Unknown)   BMI 33.48 kg/m   EXAM: General appearance: alert, no distress and moderately obese Neck: no carotid bruit, no JVD and thyroid not enlarged, symmetric, no tenderness/mass/nodules Lungs: diminished breath sounds bilaterally Heart: regular rate and rhythm Abdomen: soft, non-tender; bowel sounds normal; no masses,  no organomegaly Extremities: extremities normal, atraumatic,  no cyanosis or edema Pulses: 2+ and symmetric Skin: Skin color, texture, turgor normal. No rashes or lesions Neurologic: Grossly normal Psych: Pleasant  EKG: Sinus rhythm at 61-personally reviewed  ASSESSMENT: 1. Dizziness 2. Recent acute diastolic congestive heart failure, LVEF 60-65% (05/2017) 3. Nonischemic Myoview with EF 71% (06/2017) 4. Hypertension 5. Cirrhosis 6. Insulin-dependent diabetes mellitus 7. Dyslipidemia  PLAN: 1.   Margaret Dyer is doing well and her weight is actually 8 to 10 pounds lower than it was previously.  She had recent acute diastolic congestive heart failure and is been on twice daily Lasix.  She is had some dizziness and I would like to make sure this is not related to dehydration.  We will check a metabolic profile and BNP and adjust her Lasix accordingly.  She also has a follow-up with neurology later this month.  Follow-up with me annually or sooner as necessary.Pixie Casino, MD, [place]  Attending Cardiologist  Direct Dial: [phone]  Fax: [phone]  Website:  [email or web address] 05/03/2018, 2:04 PM

## 2018-05-04 LAB — BASIC METABOLIC PANEL
BUN/Creatinine Ratio: 28 (ref 12–28)
BUN: 34 mg/dL — AB (ref 8–27)
CALCIUM: 9.2 mg/dL (ref 8.7–10.3)
CO2: 26 mmol/L (ref 20–29)
CREATININE: 1.21 mg/dL — AB (ref 0.57–1.00)
Chloride: 97 mmol/L (ref 96–106)
GFR calc non Af Amer: 42 mL/min/{1.73_m2} — ABNORMAL LOW (ref >59)
GFR, EST AFRICAN AMERICAN: 49 mL/min/{1.73_m2} — AB (ref >59)
Glucose: 186 mg/dL — ABNORMAL HIGH (ref 65–99)
POTASSIUM: 4.3 mmol/L (ref 3.5–5.2)
Sodium: 139 mmol/L (ref 134–144)

## 2018-05-04 LAB — PRO B NATRIURETIC PEPTIDE: NT-Pro BNP: 399 pg/mL (ref 0–738)

## 2018-05-07 ENCOUNTER — Encounter: Payer: Self-pay | Admitting: Internal Medicine

## 2018-05-24 DIAGNOSIS — E1122 Type 2 diabetes mellitus with diabetic chronic kidney disease: Secondary | ICD-10-CM | POA: Diagnosis not present

## 2018-05-24 DIAGNOSIS — E785 Hyperlipidemia, unspecified: Secondary | ICD-10-CM | POA: Diagnosis not present

## 2018-05-24 DIAGNOSIS — Z794 Long term (current) use of insulin: Secondary | ICD-10-CM | POA: Diagnosis not present

## 2018-05-24 DIAGNOSIS — N183 Chronic kidney disease, stage 3 (moderate): Secondary | ICD-10-CM | POA: Diagnosis not present

## 2018-05-24 DIAGNOSIS — E039 Hypothyroidism, unspecified: Secondary | ICD-10-CM | POA: Diagnosis not present

## 2018-05-30 ENCOUNTER — Ambulatory Visit (INDEPENDENT_AMBULATORY_CARE_PROVIDER_SITE_OTHER): Payer: Medicare Other | Admitting: Neurology

## 2018-05-30 ENCOUNTER — Encounter: Payer: Self-pay | Admitting: Neurology

## 2018-05-30 VITALS — BP 128/65 | HR 72 | Ht 63.5 in | Wt 194.5 lb

## 2018-05-30 DIAGNOSIS — E538 Deficiency of other specified B group vitamins: Secondary | ICD-10-CM

## 2018-05-30 DIAGNOSIS — R269 Unspecified abnormalities of gait and mobility: Secondary | ICD-10-CM

## 2018-05-30 DIAGNOSIS — R413 Other amnesia: Secondary | ICD-10-CM

## 2018-05-30 DIAGNOSIS — R42 Dizziness and giddiness: Secondary | ICD-10-CM | POA: Diagnosis not present

## 2018-05-30 NOTE — Patient Instructions (Signed)
We will get MRI of the brain and get a carotid doppler study.

## 2018-05-30 NOTE — Progress Notes (Signed)
Reason for visit: Memory disturbance, dizziness  Referring physician: Dr. Louann Liv is a 83 y.o. female  History of present illness:  Ms. All is an 83 year old right-handed white female with a history of congestive heart failure.  The patient has diabetes that has been poorly controlled until just recently.  She has been under a lot of stress recently with the death of her husband and her grandchild.  The patient reports some mild problems with memory over the last 6 months or so, she reports some difficulty with memory names for people, she still operates a motor vehicle without difficulty, she does not have problems with directions.  She is able to manage her own medications and appointments, she is able to keep up with her own finances.  She has not given up any activities of daily living because of memory.  She also has had some troubles with dizziness over the last 2 to 3 months.  The patient indicates that she has no dizziness with sitting or lying down, she first gets up out of bed in the morning, the dizziness is the worst, it is a lightheaded sensation without vertigo.  Later in the day, the dizziness improves.  She feels better with the dizziness if she is holding onto something such as a walker or touching a table or chair.  The patient does have some numbness in the right hand and in both feet associated with a diabetes.  The patient reports no significant neck pain.  She has not had any fainting episodes.  She does report some troubles with low back pain.  The patient does report some shortness of breath if she walks too long, with the shortness of breath she also has increased dizziness.  She is sent to this office for an evaluation.  Past Medical History:  Diagnosis Date  . Asthma   . Blood transfusion   . Chronic diastolic CHF (congestive heart failure) ([place])   . CKD (chronic kidney disease), stage III ([place])   . Coronary artery calcification seen on CT scan   .  Diabetes mellitus   . Goiter   . Hyperlipidemia   . Hypertension   . Hypothyroidism   . Liver cirrhosis ([place])   . NASH (nonalcoholic steatohepatitis)   . Obesity   . Osteopenia   . Pulmonary hypertension ([place])   . Renal cell carcinoma    a. prior nephrectomy.  . Right heart failure ([place])   . Thrombocytopenia ([place])     Past Surgical History:  Procedure Laterality Date  . ABDOMINAL HYSTERECTOMY    . APPENDECTOMY    . HEMORRHOID SURGERY    . KNEE SURGERY    . NEPHRECTOMY  09.2000    Family History  Problem Relation Age of Onset  . Cancer Sister        bladder  . Kidney cancer Father   . Cancer Father        renal  . COPD Father   . Congestive Heart Failure Father   . Coronary artery disease Unknown   . Diabetes Unknown   . Hyperlipidemia Unknown   . Hypertension Unknown   . Rheumatologic disease Neg Hx     Social history:  reports that she has quit smoking. She has never used smokeless tobacco. She reports that she does not drink alcohol or use drugs.  Medications:  Prior to Admission medications   Medication Sig Start Date End Date Taking? Authorizing Provider  amLODipine (NORVASC) 10  MG tablet TAKE 1 TABLET BY MOUTH EVERY MORNING 03/06/18  Yes Hilty, Nadean Corwin, MD  aspirin EC 81 MG tablet Take 1 tablet (81 mg total) by mouth daily. 06/05/17  Yes Dunn, Nedra Hai, PA-C  furosemide (LASIX) 40 MG tablet take 1 tablet by mouth twice a day 02/05/18  Yes Hilty, Nadean Corwin, MD  gabapentin (NEURONTIN) 100 MG capsule take 1 capsule by mouth twice a day Patient taking differently: take 100 mg by mouth twice a day as needed 11/07/17  Yes Roma Schanz R, DO  hydrALAZINE (APRESOLINE) 50 MG tablet Take 50 mg by mouth 2 (two) times daily. Taking one tablet in the morning and one half tablet in the evening   Yes [provider]  insulin glargine (LANTUS) 100 UNIT/ML injection Inject 50 Units into the skin every evening.    Yes [provider]  insulin glulisine  (APIDRA) 100 UNIT/ML injection Inject 0.15 mLs (15 Units total) into the skin 3 (three) times daily with meals. 12/05/17  Yes Domenic Polite, MD  levothyroxine (SYNTHROID, LEVOTHROID) 25 MCG tablet take 1 tablet by mouth daily 07/13/17  Yes Lowne Lyndal Pulley R, DO  LORazepam (ATIVAN) 0.5 MG tablet Take 1 tablet (0.5 mg total) by mouth 2 (two) times daily as needed. 01/25/18  Yes Roma Schanz R, DO  metoprolol tartrate (LOPRESSOR) 25 MG tablet TAKE 1 TABLET BY MOUTH TWICE DAILY 12/20/17  Yes Roma Schanz R, DO  oxybutynin (DITROPAN) 5 MG tablet Take 1 tablet (5 mg total) by mouth every morning. 11/09/17  Yes Roma Schanz R, DO  potassium chloride SA (K-DUR,KLOR-CON) 20 MEQ tablet Take 20 mEq by mouth every evening.   Yes [provider]  traMADol (ULTRAM) 50 MG tablet Take 1 tablet (50 mg total) by mouth every 6 (six) hours as needed for moderate pain. 02/15/18  Yes Roma Schanz R, DO  albuterol (PROVENTIL) (2.5 MG/3ML) 0.083% nebulizer solution Take 3 mLs (2.5 mg total) by nebulization every 6 (six) hours as needed for wheezing or shortness of breath. Patient not taking: Reported on 05/30/2018 12/05/17   Domenic Polite, MD      Allergies  Allergen Reactions  . Cefuroxime Axetil     Upset stomach   . Ciprofloxacin     Upset stomach    ROS:  Out of a complete 14 system review of symptoms, the patient complains only of the following symptoms, and all other reviewed systems are negative.  Fatigue Swelling in the ankles Shortness of breath Incontinence of the bladder, urination problems Easy bruising, easy bleeding Increased thirst Joint pain, aching muscles Memory loss, confusion, weakness, dizziness Depression, anxiety, none of sleep, decreased energy Insomnia  Blood pressure 128/65, pulse 72, height 5' 3.5" (1.613 m), weight 194 lb 8 oz (88.2 kg).   Blood pressure, right arm, standing is 146/70.  Physical Exam  General: The patient is alert and  cooperative at the time of the examination.  The patient is moderately obese.  Eyes: Pupils are equal, round, and reactive to light. Discs are flat bilaterally.  Neck: The neck is supple, no carotid bruits are noted.  Respiratory: The respiratory examination is clear.  Cardiovascular: The cardiovascular examination reveals a regular rate and rhythm, no obvious murmurs or rubs are noted.  Skin: Extremities are with 1-2+ edema below the knees is seen bilaterally.  Neurologic Exam  Mental status: The patient is alert and oriented x 3 at the time of the examination. The patient has apparent  normal recent and remote memory, with an apparently normal attention span and concentration ability.  Mini-Mental status examination done today shows a total score of 30/30.  Cranial nerves: Facial symmetry is present. There is good sensation of the face to pinprick and soft touch bilaterally. The strength of the facial muscles and the muscles to head turning and shoulder shrug are normal bilaterally. Speech is well enunciated, no aphasia or dysarthria is noted. Extraocular movements are full. Visual fields are full. The tongue is midline, and the patient has symmetric elevation of the soft palate. No obvious hearing deficits are noted.  Motor: The motor testing reveals 5 over 5 strength of all 4 extremities. Good symmetric motor tone is noted throughout.  Sensory: Sensory testing is intact to pinprick, soft touch, vibration sensation, and position sense on all 4 extremities, with exception of a stocking pattern pinprick sensory deficit across the ankles bilaterally. No evidence of extinction is noted.  Coordination: Cerebellar testing reveals good finger-nose-finger and heel-to-shin bilaterally.  Gait and station: Gait is slightly wide-based, the patient is able to ambulate independently but usually uses a walker.  Tandem gait is unsteady.  Romberg is negative. No drift is seen.  Reflexes: Deep tendon  reflexes are symmetric, but are depressed bilaterally. Toes are downgoing bilaterally.   Assessment/Plan:  1.  Dizziness, postural  2.  Reports of mild memory disturbance  The patient has dizziness only with standing, worse in the morning.  The patient will take her blood pressure before getting up out of bed in the morning, and then again when she is symptomatic.  She will keep a journal for at least 2 weeks.  The patient will be set up for MRI evaluation of the brain, she will have blood work done today.  I will get a carotid Doppler study.  She will follow-up in 4 months.  The patient denies any new medications that were added around the time of onset of dizziness.   Jill Alexanders MD 05/30/2018 2:26 PM  [facility] [address] [place], [place] [address]  Phone [phone] Fax [phone]

## 2018-05-31 LAB — VITAMIN B12: Vitamin B-12: 359 pg/mL (ref 232–1245)

## 2018-05-31 LAB — SEDIMENTATION RATE: SED RATE: 20 mm/h (ref 0–40)

## 2018-05-31 LAB — RPR: RPR Ser Ql: NONREACTIVE

## 2018-06-03 ENCOUNTER — Telehealth: Payer: Self-pay | Admitting: Neurology

## 2018-06-03 NOTE — Telephone Encounter (Signed)
medicare order sent to GI. No auth they will reach out to the pt to schedule.

## 2018-06-07 ENCOUNTER — Other Ambulatory Visit: Payer: Self-pay | Admitting: Family Medicine

## 2018-06-07 ENCOUNTER — Telehealth: Payer: Self-pay

## 2018-06-07 DIAGNOSIS — D696 Thrombocytopenia, unspecified: Secondary | ICD-10-CM

## 2018-06-07 DIAGNOSIS — M1990 Unspecified osteoarthritis, unspecified site: Secondary | ICD-10-CM

## 2018-06-07 NOTE — Telephone Encounter (Signed)
Author phoned pt. Re: request for referral to Dr. Marin Olp for hx low platelets, and increased bruising on arms bilaterally. Pt. has not had a recent CBC drawn. Dr. Etter Sjogren out of the office today, but author encouraged pt. to make OV with Dr. Etter Sjogren in the meantime. Pt. requested 7/16 appointment. Correspondence sent to Dr. Etter Sjogren for any additional recommendations.  Copied from [place] [id number]. Topic: Referral - Request >> Jun 07, 2018 12:11 PM Rutherford Nail, NT wrote: Reason for CRM: Patient is requesting a referral to the Dr Marin Olp at the [facility]. Last visit with them was in 2016. Please advise. CB#: [phone]

## 2018-06-10 ENCOUNTER — Other Ambulatory Visit (INDEPENDENT_AMBULATORY_CARE_PROVIDER_SITE_OTHER): Payer: Medicare Other

## 2018-06-10 DIAGNOSIS — D696 Thrombocytopenia, unspecified: Secondary | ICD-10-CM | POA: Diagnosis not present

## 2018-06-10 LAB — CBC WITH DIFFERENTIAL/PLATELET
Basophils Absolute: 0.1 10*3/uL (ref 0.0–0.1)
Basophils Relative: 0.9 % (ref 0.0–3.0)
EOS PCT: 2.6 % (ref 0.0–5.0)
Eosinophils Absolute: 0.2 10*3/uL (ref 0.0–0.7)
HEMATOCRIT: 43.3 % (ref 36.0–46.0)
Hemoglobin: 14.8 g/dL (ref 12.0–15.0)
LYMPHS PCT: 17.6 % (ref 12.0–46.0)
Lymphs Abs: 1.3 10*3/uL (ref 0.7–4.0)
MCHC: 34.2 g/dL (ref 30.0–36.0)
MCV: 89.7 fl (ref 78.0–100.0)
MONO ABS: 0.5 10*3/uL (ref 0.1–1.0)
Monocytes Relative: 7.4 % (ref 3.0–12.0)
Neutro Abs: 5.3 10*3/uL (ref 1.4–7.7)
Neutrophils Relative %: 71.5 % (ref 43.0–77.0)
Platelets: 133 10*3/uL — ABNORMAL LOW (ref 150.0–400.0)
RBC: 4.82 Mil/uL (ref 3.87–5.11)
RDW: 13.8 % (ref 11.5–15.5)
WBC: 7.4 10*3/uL (ref 4.0–10.5)

## 2018-06-10 NOTE — Telephone Encounter (Signed)
We should repeat cbcd first --- if it has gone down we can put referral in for hematology with Dr Marin Olp

## 2018-06-10 NOTE — Telephone Encounter (Signed)
Author phoned pt. To relay Dr. Nonda Lou message about repeating cbcd before placing referral to Dr. Marin Olp. Pt. Stated she could possibly come today, but needed to arrange transportation. Pt. Stated she would call back. Order placed in the meantime.

## 2018-06-10 NOTE — Telephone Encounter (Signed)
Pt called back in, she is on her way to the office to complete labs.

## 2018-06-11 NOTE — Telephone Encounter (Signed)
Requesting:Tramadol Contract:05/28/18 UDS:02/15/18 Last Visit:02/15/18 Next Visit:06/18/18 Last Refill:02/15/18 1 refill  Database ran and is on desk for review  Please Advise

## 2018-06-12 ENCOUNTER — Other Ambulatory Visit: Payer: Self-pay | Admitting: Family Medicine

## 2018-06-12 DIAGNOSIS — D691 Qualitative platelet defects: Secondary | ICD-10-CM

## 2018-06-18 ENCOUNTER — Ambulatory Visit: Payer: Medicare Other | Admitting: Family Medicine

## 2018-06-19 ENCOUNTER — Other Ambulatory Visit: Payer: Medicare Other

## 2018-06-22 ENCOUNTER — Other Ambulatory Visit: Payer: Medicare Other

## 2018-06-23 ENCOUNTER — Other Ambulatory Visit: Payer: Medicare Other

## 2018-06-25 ENCOUNTER — Ambulatory Visit: Payer: Medicare Other | Admitting: Family Medicine

## 2018-06-26 ENCOUNTER — Ambulatory Visit (HOSPITAL_COMMUNITY): Payer: Medicare Other

## 2018-06-26 ENCOUNTER — Encounter (HOSPITAL_COMMUNITY): Payer: Medicare Other

## 2018-06-27 DIAGNOSIS — M1711 Unilateral primary osteoarthritis, right knee: Secondary | ICD-10-CM | POA: Diagnosis not present

## 2018-07-01 ENCOUNTER — Encounter: Payer: Self-pay | Admitting: Family Medicine

## 2018-07-01 ENCOUNTER — Ambulatory Visit (HOSPITAL_BASED_OUTPATIENT_CLINIC_OR_DEPARTMENT_OTHER)
Admission: RE | Admit: 2018-07-01 | Discharge: 2018-07-01 | Disposition: A | Discharge status: Home / Self Care | Payer: Medicare Other | Source: Ambulatory Visit | Attending: Family Medicine | Admitting: Family Medicine

## 2018-07-01 ENCOUNTER — Ambulatory Visit (INDEPENDENT_AMBULATORY_CARE_PROVIDER_SITE_OTHER): Payer: Medicare Other | Admitting: Family Medicine

## 2018-07-01 VITALS — BP 129/53 | HR 74 | Temp 98.3°F | Ht 64.0 in | Wt 194.0 lb

## 2018-07-01 DIAGNOSIS — M79651 Pain in right thigh: Secondary | ICD-10-CM | POA: Diagnosis not present

## 2018-07-01 DIAGNOSIS — S8991XA Unspecified injury of right lower leg, initial encounter: Secondary | ICD-10-CM | POA: Diagnosis not present

## 2018-07-01 DIAGNOSIS — M7989 Other specified soft tissue disorders: Secondary | ICD-10-CM | POA: Diagnosis not present

## 2018-07-01 DIAGNOSIS — S8992XA Unspecified injury of left lower leg, initial encounter: Secondary | ICD-10-CM | POA: Diagnosis not present

## 2018-07-01 MED ORDER — DOXYCYCLINE HYCLATE 100 MG PO TABS: 100.0000 mg | ORAL_TABLET | Freq: Two times a day (BID) | ORAL | 0 refills | Status: DC

## 2018-07-01 NOTE — Patient Instructions (Signed)
Phlebitis Phlebitis is soreness and swelling (inflammation) of a vein. This can occur in your arms, legs, or torso (trunk), as well as deeper inside your body. Phlebitis is usually not serious when it occurs close to the surface of the body. However, it can cause serious problems when it occurs in a vein deeper inside the body. What are the causes? Phlebitis can be triggered by various things, including:  Reduced blood flow through your veins. This can happen with: ? Bed rest over a long period. ? Long-distance travel. ? Injury. ? Surgery. ? Being overweight (obese) or pregnant.  Having an IV tube put in the vein and getting certain medicines through the vein.  Cancer and cancer treatment.  Use of illegal drugs taken through the vein.  Inflammatory diseases.  Inherited (genetic) diseases that increase the risk of blood clots.  Hormone therapy, such as birth control pills.  What are the signs or symptoms?  Red, tender, swollen, and painful area on your skin. Usually, the area will be long and narrow.  Firmness along the center of the affected area. This can indicate that a blood clot has formed.  Low-grade fever. How is this diagnosed? A health care provider can usually diagnose phlebitis by examining the affected area and asking about your symptoms. To check for infection or blood clots, your health care provider may order blood tests or an ultrasound exam of the area. Blood tests and your family history may also indicate if you have an underlying genetic disease that causes blood clots. Occasionally, a piece of tissue is taken from the body (biopsy sample) if an unusual cause of phlebitis is suspected. How is this treated? Treatment will vary depending on the severity of the condition and the area of the body affected. Treatment may include:  Use of a warm compress or heating pad.  Use of compression stockings or bandages.  Anti-inflammatory medicines.  Removal of any IV  tube that may be causing the problem.  Medicines that kill germs (antibiotics) if an infection is present.  Blood-thinning medicines if a blood clot is suspected or present.  In rare cases, surgery may be needed to remove damaged sections of vein.  Follow these instructions at home:  Only take over-the-counter or prescription medicines as directed by your health care provider. Take all medicines exactly as prescribed.  Raise (elevate) the affected area above the level of your heart as directed by your health care provider.  Apply a warm compress or heating pad to the affected area as directed by your health care provider. Do not sleep with the heating pad.  Use compression stockings or bandages as directed. These will speed healing and prevent the condition from coming back.  If you are on blood thinners: ? Get follow-up blood tests as directed by your health care provider. ? Check with your health care provider before using any new medicines. ? Carry a medical alert card or wear your medical alert jewelry to show that you are on blood thinners.  For phlebitis in the legs: ? Avoid prolonged standing or bed rest. ? Keep your legs moving. Raise your legs when sitting or lying.  Do not smoke.  Women, particularly those over the age of 25, should consider the risks and benefits of taking the contraceptive pill. This kind of hormone treatment can increase your risk for blood clots.  Follow up with your health care provider as directed. Contact a health care provider if:  You have unusual bruising or any  bleeding problems.  Your swelling or pain in the affected area is not improving.  You are on anti-inflammatory medicine, and you develop belly (abdominal) pain. Get help right away if:  You have a sudden onset of chest pain or difficulty breathing.  You have a fever or persistent symptoms for more than 2-3 days.  You have a fever and your symptoms suddenly get worse. This  information is not intended to replace advice given to you by your health care provider. Make sure you discuss any questions you have with your health care provider. Document Released: 11/14/2001 Document Revised: 04/27/2016 Document Reviewed: 07/28/2013 Elsevier Interactive Patient Education  2017 [employer].

## 2018-07-01 NOTE — Progress Notes (Signed)
Check labs.  Adjust meds prn Patient ID: Margaret Dyer, female    DOB: 05/15/1935  Age: 83 y.o. MRN: [id number]    Subjective:  Subjective  HPI Margaret Dyer presents for f/u injection in R knee --- pain has gotten worse -- Dr Tonita Cong thought she might have a clot or phlebitis-- and told her to come here.    Review of Systems  Constitutional: Negative for appetite change, diaphoresis, fatigue and unexpected weight change.  Eyes: Negative for pain, redness and visual disturbance.  Respiratory: Negative for cough, chest tightness, shortness of breath and wheezing.   Cardiovascular: Negative for chest pain, palpitations and leg swelling.  Endocrine: Negative for cold intolerance, heat intolerance, polydipsia, polyphagia and polyuria.  Genitourinary: Negative for difficulty urinating, dysuria and frequency.  Musculoskeletal: Positive for gait problem and joint swelling.  Neurological: Negative for dizziness, light-headedness, numbness and headaches.    History Past Medical History:  Diagnosis Date  . Asthma   . Blood transfusion   . Chronic diastolic CHF (congestive heart failure) ([place])   . CKD (chronic kidney disease), stage III ([place])   . Coronary artery calcification seen on CT scan   . Diabetes mellitus   . Goiter   . Hyperlipidemia   . Hypertension   . Hypothyroidism   . Liver cirrhosis ([place])   . NASH (nonalcoholic steatohepatitis)   . Obesity   . Osteopenia   . Pulmonary hypertension ([place])   . Renal cell carcinoma    a. prior nephrectomy.  . Right heart failure ([place])   . Thrombocytopenia ([place])     She has a past surgical history that includes Nephrectomy (09.2000); Appendectomy; Abdominal hysterectomy; Knee surgery; and Hemorrhoid surgery.   Her family history includes COPD in her father; Cancer in her father and sister; Congestive Heart Failure in her father; Coronary artery disease in her unknown relative; Diabetes in her unknown relative; Hyperlipidemia in her unknown  relative; Hypertension in her unknown relative; Kidney cancer in her father.She reports that she has quit smoking. She has never used smokeless tobacco. She reports that she does not drink alcohol or use drugs.  Current Outpatient Medications on File Prior to Visit  Medication Sig Dispense Refill  . amLODipine (NORVASC) 10 MG tablet TAKE 1 TABLET BY MOUTH EVERY MORNING 30 tablet 3  . aspirin EC 81 MG tablet Take 1 tablet (81 mg total) by mouth daily. 90 tablet 3  . furosemide (LASIX) 40 MG tablet take 1 tablet by mouth twice a day 60 tablet 3  . gabapentin (NEURONTIN) 100 MG capsule take 1 capsule by mouth twice a day (Patient taking differently: take 100 mg by mouth twice a day as needed) 60 capsule 5  . hydrALAZINE (APRESOLINE) 50 MG tablet Take 50 mg by mouth 2 (two) times daily. Taking one tablet in the morning and one half tablet in the evening    . insulin glargine (LANTUS) 100 UNIT/ML injection Inject 50 Units into the skin every evening.     . insulin glulisine (APIDRA) 100 UNIT/ML injection Inject 0.15 mLs (15 Units total) into the skin 3 (three) times daily with meals.    Marland Kitchen levothyroxine (SYNTHROID, LEVOTHROID) 25 MCG tablet take 1 tablet by mouth daily 30 tablet 5  . LORazepam (ATIVAN) 0.5 MG tablet Take 1 tablet (0.5 mg total) by mouth 2 (two) times daily as needed. 60 tablet 1  . metoprolol tartrate (LOPRESSOR) 25 MG tablet TAKE 1 TABLET BY MOUTH TWICE DAILY 180 tablet 1  .  oxybutynin (DITROPAN) 5 MG tablet Take 1 tablet (5 mg total) by mouth every morning. 90 tablet 3  . potassium chloride SA (K-DUR,KLOR-CON) 20 MEQ tablet Take 20 mEq by mouth every evening.    . traMADol (ULTRAM) 50 MG tablet TAKE 1 TABLET BY MOUTH EVERY 6 HOURS IF NEEDED FOR MODERATE PAIN 60 tablet 0   No current facility-administered medications on file prior to visit.      Objective:  Objective  Physical Exam  Musculoskeletal: She exhibits edema and tenderness.       Right upper leg: She exhibits tenderness  and edema.       Legs: Nursing note and vitals reviewed.  BP (!) 129/53 (BP Location: Left Arm, Patient Position: Sitting, Cuff Size: Large)   Pulse 74   Temp 98.3 F (36.8 C) (Oral)   Ht 5' 4" (1.626 m)   Wt 194 lb (88 kg)   LMP  (LMP Unknown)   SpO2 96%   BMI 33.30 kg/m  Wt Readings from Last 3 Encounters:  07/01/18 194 lb (88 kg)  05/30/18 194 lb 8 oz (88.2 kg)  05/03/18 192 lb (87.1 kg)     Lab Results  Component Value Date   WBC 7.4 06/10/2018   HGB 14.8 06/10/2018   HCT 43.3 06/10/2018   PLT 133.0 (L) 06/10/2018   GLUCOSE 186 (H) 05/03/2018   CHOL 160 05/28/2017   TRIG 145.0 05/28/2017   HDL 37.30 (L) 05/28/2017   LDLDIRECT 89.0 01/19/2015   LDLCALC 94 05/28/2017   ALT 24 12/17/2017   AST 24 12/17/2017   NA 139 05/03/2018   K 4.3 05/03/2018   CL 97 05/03/2018   CREATININE 1.21 (H) 05/03/2018   BUN 34 (H) 05/03/2018   CO2 26 05/03/2018   TSH 1.328 05/13/2017   INR 1.17 05/18/2017   HGBA1C 8.8 (H) 05/13/2017   MICROALBUR 52.5 (H) 05/28/2017    Ct Angio Chest Pe W And/or Wo Contrast  Result Date: 12/01/2017 CLINICAL DATA:  83 year old female with shortness of breath. Concern for PE. EXAM: CT ANGIOGRAPHY CHEST WITH CONTRAST TECHNIQUE: Multidetector CT imaging of the chest was performed using the standard protocol during bolus administration of intravenous contrast. Multiplanar CT image reconstructions and MIPs were obtained to evaluate the vascular anatomy. CONTRAST:  159m ISOVUE-370 IOPAMIDOL (ISOVUE-370) INJECTION 76% COMPARISON:  Chest radiograph dated 12/01/2017 and chest CT dated 05/20/2017 FINDINGS: Cardiovascular: There is mild cardiomegaly. Multi vessel coronary vascular calcification. No pericardial effusion. Moderate atherosclerotic calcification of the thoracic aorta. Evaluation of the aorta is limited due to suboptimal enhancement and timing of the contrast. Evaluation of the pulmonary artery is is limited due to suboptimal opacification of the  peripheral branches. There is no CT evidence of acute pulmonary artery embolus. Low attenuating area along the right lower lobe pulmonary artery (series 7, image 143) as well as adjacent to the right upper lobe pulmonary artery (series 7, image 63) appear chronic and extravascular. Mediastinum/Nodes: Mildly prominent bilateral hilar and mediastinal lymph nodes. No mediastinal fluid collection or hematoma. Lungs/Pleura: Small bilateral pleural effusions. Bibasilar atelectasis/ scarring similar or slightly improved compared to the prior CT. Pneumonia is not entirely excluded. Bilateral hazy ground-glass density may represent atelectasis and less likely edema. Clinical correlation is recommended. There is a 5 mm right middle lobe pulmonary nodule (series 6, image 59). No pneumothorax. The central airways are patent. Upper Abdomen: Cirrhosis.  Bilateral adrenal nodularity. Musculoskeletal: Degenerative changes of the spine. Review of the MIP images confirms the above findings. IMPRESSION:  1. No CT evidence of acute pulmonary artery embolus. 2. Small bilateral pleural effusions and bibasilar atelectasis/scarring. Pneumonia is not excluded. Mild diffuse hazy airspace density may represent atelectasis and less likely edema. Clinical correlation is recommended. 3. A 5 mm right middle lobe pulmonary nodule. No follow-up needed if patient is low-risk. Non-contrast chest CT can be considered in 12 months if patient is high-risk. This recommendation follows the consensus statement: Guidelines for Management of Incidental Pulmonary Nodules Detected on CT Images: From the Fleischner Society 2017; Radiology 2017; 284:228-243. 4. Cardiomegaly with coronary vascular calcification. 5.  Aortic Atherosclerosis (ICD10-I70.0). Electronically Signed   By: Anner Crete M.D.   On: 12/01/2017 04:04   Dg Chest Port 1 View  Result Date: 12/01/2017 CLINICAL DATA:  83 year old female with shortness of breath. EXAM: PORTABLE CHEST 1 VIEW  COMPARISON:  Chest CT dated 05/20/2017 FINDINGS: Linear atelectasis/ scarring in the left mid lung field. Left lung base densities likely atelectatic changes. Pneumonia is less likely but not excluded. The right lung is clear. There is no pleural effusion or pneumothorax. Mildly enlarged cardiac silhouette. There is atherosclerotic calcification of the aortic arch. No acute osseous pathology. IMPRESSION: Left lung base atelectasis/ scarring. Pneumonia is less likely. Clinical correlation is recommended. Electronically Signed   By: Anner Crete M.D.   On: 12/01/2017 00:59     Assessment & Plan:  Plan  I have discontinued Antha C. Lascola's albuterol. I am also having her start on doxycycline. Additionally, I am having her maintain her insulin glargine, aspirin EC, levothyroxine, gabapentin, oxybutynin, insulin glulisine, metoprolol tartrate, LORazepam, furosemide, amLODipine, hydrALAZINE, potassium chloride SA, and traMADol.  Meds ordered this encounter  Medications  . doxycycline (VIBRA-TABS) 100 MG tablet    Sig: Take 1 tablet (100 mg total) by mouth 2 (two) times daily.    Dispense:  20 tablet    Refill:  0    Problem List Items Addressed This Visit    None    Visit Diagnoses    Acute thigh pain, right    -  Primary   Relevant Medications   doxycycline (VIBRA-TABS) 100 MG tablet   Other Relevant Orders   US Venous Img Lower Unilateral Right (Completed)     Elevate legs Warm compresses to area  Consider vascular consult  Follow-up: Return if symptoms worsen or fail to improve.  Ann Held, DO

## 2018-07-03 ENCOUNTER — Ambulatory Visit (HOSPITAL_COMMUNITY)
Admission: RE | Admit: 2018-07-03 | Discharge: 2018-07-03 | Disposition: A | Discharge status: Home / Self Care | Payer: Medicare Other | Source: Ambulatory Visit | Attending: Neurology | Admitting: Neurology

## 2018-07-03 DIAGNOSIS — R42 Dizziness and giddiness: Secondary | ICD-10-CM | POA: Insufficient documentation

## 2018-07-03 DIAGNOSIS — I6523 Occlusion and stenosis of bilateral carotid arteries: Secondary | ICD-10-CM | POA: Insufficient documentation

## 2018-07-03 NOTE — Progress Notes (Signed)
Preliminary notes--Bilateral carotid duplex exam completed. 1-39% stenosis bilateral ICAs.  Bilateral vertebral arteries patent with antegrade flow. Lorina Rabon (RDMS RVT) 07/03/18 3:22 PM

## 2018-07-04 ENCOUNTER — Other Ambulatory Visit: Payer: Self-pay | Admitting: Internal Medicine

## 2018-07-04 ENCOUNTER — Ambulatory Visit: Payer: Medicare Other | Admitting: Family Medicine

## 2018-07-04 DIAGNOSIS — M1711 Unilateral primary osteoarthritis, right knee: Secondary | ICD-10-CM | POA: Diagnosis not present

## 2018-07-04 DIAGNOSIS — M25561 Pain in right knee: Secondary | ICD-10-CM | POA: Diagnosis not present

## 2018-07-04 NOTE — Telephone Encounter (Signed)
Rx sent to pharmacy

## 2018-07-05 ENCOUNTER — Other Ambulatory Visit: Payer: Self-pay

## 2018-07-05 ENCOUNTER — Encounter: Payer: Self-pay | Admitting: Hematology & Oncology

## 2018-07-05 ENCOUNTER — Inpatient Hospital Stay: Payer: Medicare Other | Attending: Hematology & Oncology | Admitting: Hematology & Oncology

## 2018-07-05 ENCOUNTER — Inpatient Hospital Stay: Payer: Medicare Other

## 2018-07-05 VITALS — BP 124/50 | HR 62 | Temp 99.1°F | Resp 19 | Wt 194.0 lb

## 2018-07-05 DIAGNOSIS — E1122 Type 2 diabetes mellitus with diabetic chronic kidney disease: Secondary | ICD-10-CM | POA: Diagnosis not present

## 2018-07-05 DIAGNOSIS — Z794 Long term (current) use of insulin: Secondary | ICD-10-CM | POA: Insufficient documentation

## 2018-07-05 DIAGNOSIS — I130 Hypertensive heart and chronic kidney disease with heart failure and stage 1 through stage 4 chronic kidney disease, or unspecified chronic kidney disease: Secondary | ICD-10-CM | POA: Insufficient documentation

## 2018-07-05 DIAGNOSIS — I5032 Chronic diastolic (congestive) heart failure: Secondary | ICD-10-CM | POA: Insufficient documentation

## 2018-07-05 DIAGNOSIS — I509 Heart failure, unspecified: Secondary | ICD-10-CM | POA: Insufficient documentation

## 2018-07-05 DIAGNOSIS — Z8052 Family history of malignant neoplasm of bladder: Secondary | ICD-10-CM | POA: Insufficient documentation

## 2018-07-05 DIAGNOSIS — N183 Chronic kidney disease, stage 3 (moderate): Secondary | ICD-10-CM | POA: Insufficient documentation

## 2018-07-05 DIAGNOSIS — Z800 Family history of malignant neoplasm of digestive organs: Secondary | ICD-10-CM | POA: Diagnosis not present

## 2018-07-05 DIAGNOSIS — E785 Hyperlipidemia, unspecified: Secondary | ICD-10-CM | POA: Diagnosis not present

## 2018-07-05 DIAGNOSIS — E039 Hypothyroidism, unspecified: Secondary | ICD-10-CM | POA: Insufficient documentation

## 2018-07-05 DIAGNOSIS — E119 Type 2 diabetes mellitus without complications: Secondary | ICD-10-CM | POA: Insufficient documentation

## 2018-07-05 DIAGNOSIS — Z7982 Long term (current) use of aspirin: Secondary | ICD-10-CM | POA: Insufficient documentation

## 2018-07-05 DIAGNOSIS — D696 Thrombocytopenia, unspecified: Secondary | ICD-10-CM | POA: Diagnosis not present

## 2018-07-05 DIAGNOSIS — Z87891 Personal history of nicotine dependence: Secondary | ICD-10-CM | POA: Insufficient documentation

## 2018-07-05 DIAGNOSIS — Z8553 Personal history of malignant neoplasm of renal pelvis: Secondary | ICD-10-CM | POA: Diagnosis not present

## 2018-07-05 DIAGNOSIS — K7460 Unspecified cirrhosis of liver: Secondary | ICD-10-CM | POA: Insufficient documentation

## 2018-07-05 DIAGNOSIS — E669 Obesity, unspecified: Secondary | ICD-10-CM | POA: Diagnosis not present

## 2018-07-05 DIAGNOSIS — I272 Pulmonary hypertension, unspecified: Secondary | ICD-10-CM | POA: Diagnosis not present

## 2018-07-05 DIAGNOSIS — M8580 Other specified disorders of bone density and structure, unspecified site: Secondary | ICD-10-CM | POA: Insufficient documentation

## 2018-07-05 LAB — CBC WITH DIFFERENTIAL (CANCER CENTER ONLY)
BASOS ABS: 0.1 10*3/uL (ref 0.0–0.1)
BASOS PCT: 1 %
EOS ABS: 0.2 10*3/uL (ref 0.0–0.5)
EOS PCT: 2 %
HCT: 42.4 % (ref 34.8–46.6)
HEMOGLOBIN: 14.3 g/dL (ref 11.6–15.9)
Lymphocytes Relative: 15 %
Lymphs Abs: 1.2 10*3/uL (ref 0.9–3.3)
MCH: 30.4 pg (ref 26.0–34.0)
MCHC: 33.7 g/dL (ref 32.0–36.0)
MCV: 90 fL (ref 81.0–101.0)
Monocytes Absolute: 0.8 10*3/uL (ref 0.1–0.9)
Monocytes Relative: 10 %
NEUTROS PCT: 72 %
Neutro Abs: 5.8 10*3/uL (ref 1.5–6.5)
PLATELETS: 113 10*3/uL — AB (ref 145–400)
RBC: 4.71 MIL/uL (ref 3.70–5.32)
RDW: 13.5 % (ref 11.1–15.7)
WBC: 8 10*3/uL (ref 3.9–10.0)

## 2018-07-05 LAB — PLATELET BY CITRATE

## 2018-07-05 LAB — SAVE SMEAR
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
## 2018-07-09 ENCOUNTER — Telehealth: Payer: Self-pay

## 2018-07-09 DIAGNOSIS — M1711 Unilateral primary osteoarthritis, right knee: Secondary | ICD-10-CM | POA: Diagnosis not present

## 2018-07-09 NOTE — Telephone Encounter (Signed)
Pt came into the office today stating she was just at [facility] and BP was 110/40. Pt states her BP has never been that low, she is feeling dizzy and wanted [place] to recheck BP. Pt advised of walk in policy and offered to contact EMS or next available appointment. Pt declined.

## 2018-07-10 ENCOUNTER — Telehealth: Payer: Self-pay | Admitting: Neurology

## 2018-07-10 NOTE — Telephone Encounter (Signed)
I called the patient. The carotid doppler is normal. No cause of the dizziness is found.   Carotid doppler 07/09/18:  Final Interpretation: Right Carotid: Velocities in the right ICA are consistent with a 1-39% stenosis.  Left Carotid: Velocities in the left ICA are consistent with a 1-39% stenosis.  Vertebrals: Bilateral vertebral arteries demonstrate antegrade flow.

## 2018-07-11 ENCOUNTER — Other Ambulatory Visit: Payer: Self-pay | Admitting: Family Medicine

## 2018-07-16 ENCOUNTER — Other Ambulatory Visit: Payer: Self-pay | Admitting: Family Medicine

## 2018-07-16 DIAGNOSIS — M1990 Unspecified osteoarthritis, unspecified site: Secondary | ICD-10-CM

## 2018-07-16 NOTE — Telephone Encounter (Signed)
  Last written: 06/11/18 Last ov: 07/01/18 Next ov: none Contract: due UDS: 08/18/18

## 2018-07-19 ENCOUNTER — Ambulatory Visit
Admission: RE | Admit: 2018-07-19 | Discharge: 2018-07-19 | Disposition: A | Discharge status: Home / Self Care | Payer: Medicare Other | Source: Ambulatory Visit | Attending: Neurology | Admitting: Neurology

## 2018-07-19 DIAGNOSIS — R269 Unspecified abnormalities of gait and mobility: Secondary | ICD-10-CM

## 2018-07-19 DIAGNOSIS — R413 Other amnesia: Secondary | ICD-10-CM

## 2018-07-22 ENCOUNTER — Telehealth: Payer: Self-pay | Admitting: Neurology

## 2018-07-22 NOTE — Telephone Encounter (Signed)
Hilda Blades- can you take care of this request? Thank you!

## 2018-07-22 NOTE — Telephone Encounter (Signed)
  I called the patient.  MRI of the brain shows minimal white matter changes, there is some involvement in the brainstem, not clear that this is the sole cause of her dizziness however.  The patient does not report dizziness onset with any medication addition or dosing adjustment.  The patient has been checking blood pressures with standing while symptomatic, she is not getting large drops in blood pressure.  The cause of the dizziness is not clear.  Carotid Doppler was unremarkable.  We will need to follow the memory issues over time.  She has diabetes, the patient may have a mild diabetic neuropathy, again not clear this would be severe enough to cause sensation of dizziness.  We may need to look into a nerve conduction study at some point in the future.   MRI brain 07/21/18:  IMPRESSION: This MRI of the brain without contrast shows the following: 1.   Moderate generalized cortical atrophy. 2.   Chronic microvascular ischemic changes in the pons, left thalamus and hemispheres. 3.   There are no acute findings and no definite change compared to the 05/2017 CT scan.

## 2018-07-23 DIAGNOSIS — M1711 Unilateral primary osteoarthritis, right knee: Secondary | ICD-10-CM | POA: Diagnosis not present

## 2018-07-23 DIAGNOSIS — M79671 Pain in right foot: Secondary | ICD-10-CM | POA: Diagnosis not present

## 2018-07-24 ENCOUNTER — Other Ambulatory Visit: Payer: Self-pay | Admitting: Internal Medicine

## 2018-08-01 ENCOUNTER — Ambulatory Visit (INDEPENDENT_AMBULATORY_CARE_PROVIDER_SITE_OTHER): Payer: Medicare Other | Admitting: Family Medicine

## 2018-08-01 ENCOUNTER — Encounter: Payer: Self-pay | Admitting: Family Medicine

## 2018-08-01 VITALS — BP 124/41 | HR 64 | Temp 98.7°F | Resp 16

## 2018-08-01 DIAGNOSIS — K122 Cellulitis and abscess of mouth: Secondary | ICD-10-CM | POA: Diagnosis not present

## 2018-08-01 DIAGNOSIS — B029 Zoster without complications: Secondary | ICD-10-CM | POA: Diagnosis not present

## 2018-08-01 DIAGNOSIS — M1990 Unspecified osteoarthritis, unspecified site: Secondary | ICD-10-CM

## 2018-08-01 DIAGNOSIS — Z79899 Other long term (current) drug therapy: Secondary | ICD-10-CM | POA: Diagnosis not present

## 2018-08-01 MED ORDER — CEFTRIAXONE SODIUM 1 G IJ SOLR
1.0000 g | Freq: Once | INTRAMUSCULAR | Status: AC
  Administered 2018-08-01: 1 g via INTRAMUSCULAR

## 2018-08-01 MED ORDER — DOXYCYCLINE HYCLATE 100 MG PO TABS: 100.0000 mg | ORAL_TABLET | Freq: Two times a day (BID) | ORAL | 0 refills | Status: DC

## 2018-08-01 MED ORDER — VALACYCLOVIR HCL 1 G PO TABS: 1000.0000 mg | ORAL_TABLET | Freq: Two times a day (BID) | ORAL | 0 refills | Status: DC

## 2018-08-01 NOTE — Patient Instructions (Signed)
Cold Sore A cold sore, also called a fever blister, is a skin infection that causes small, fluid-filled sores to form inside of the mouth or on the lips, gums, nose, chin, or cheeks. Cold sores can spread to other parts of the body, such as the eyes or fingers. In some people with other medical conditions, cold sores can spread to multiple other body sites, including the genitals. Cold sores can be spread or passed from person to person (contagious) until the sores crust over completely. What are the causes? Cold sores are caused by the herpes simplex virus (HSV-1). HSV-1 is closely related to the virus that causes genital herpes (HSV-2), but these viruses are not the same. Once a person is infected with HSV-1, the virus remains permanently in the body. HSV-1 is spread from person to person through close contact, such as through kissing, touching the affected area, or sharing personal items such as lip balm, razors, or eating utensils. What increases the risk? A cold sore outbreak is more likely to develop in people who:  Are tired, stressed, or sick.  Are menstruating.  Are pregnant.  Take certain medicines.  Are exposed to cold weather or too much sun.  What are the signs or symptoms? Symptoms of a cold sore outbreak often go through different stages. Here is how a cold sore develops:  Tingling, itching, or burning is felt 1-2 days before the outbreak.  Fluid-filled blisters appear on the lips, inside the mouth, on the nose, or on the cheeks.  The blisters start to ooze clear fluid.  The blisters dry up and a yellow crust appears in its place.  The crust falls off.  Other symptoms include:  Fever.  Sore throat.  Headache.  Muscle aches.  Swollen neck glands.  You also may not have any symptoms. How is this diagnosed? This condition is often diagnosed based on your medical history and a physical exam. Your health care provider may swab your sore and then examine it in  the lab. Rarely, blood tests may be done to check for HSV-1. How is this treated? There is no cure for cold sores or HSV-1. There also is no vaccine for HSV-1. Most cold sores go away on their own without treatment within two weeks. Medicines cannot make the infection go away, but medicines can:  Help relieve some of the pain associated with the sores.  Work to stop the virus from multiplying.  Shorten healing time.  Medicines may be in the form of creams, gels, pills, or a shot. Follow these instructions at home: Medicines  Take or apply over-the-counter and prescription medicines only as told by your health care provider.  Use a cotton-tip swab to apply creams or gels to your sores. Sore Care  Do not touch the sores or pick the scabs.  Wash your hands often. Do not touch your eyes without washing your hands first.  Keep the sores clean and dry.  If directed, apply ice to the sores: ? Put ice in a plastic bag. ? Place a towel between your skin and the bag. ? Leave the ice on for 20 minutes, 2-3 times per day. Lifestyle  Do not kiss, have oral sex, or share personal items until your sores heal.  Eat a soft, bland diet. Avoid eating hot, cold, or salty foods. These can hurt your mouth.  Use a straw if it hurts to drink out of a glass.  Avoid the sun and limit your stress if these things  trigger outbreaks. If sun causes cold sores, apply sunscreen on your lips before being out in the sun. Contact a health care provider if:  You have symptoms for more than two weeks.  You have pus coming from the sores.  You have redness that is spreading.  You have pain or irritation in your eye.  You get sores on your genitals.  Your sores do not heal within two weeks.  You have frequent cold sore outbreaks. Get help right away if:  You have a fever and your symptoms suddenly get worse.  You have a headache and confusion. This information is not intended to replace advice  given to you by your health care provider. Make sure you discuss any questions you have with your health care provider. Document Released: 11/17/2000 Document Revised: 07/14/2016 Document Reviewed: 09/10/2015 Elsevier Interactive Patient Education  [employer].

## 2018-08-01 NOTE — Addendum Note (Signed)
Addended by: Kem Boroughs D on: 08/01/2018 05:45 PM   Modules accepted: Orders

## 2018-08-01 NOTE — Progress Notes (Signed)
Patient ID: Margaret Dyer, female    DOB: [birth year]  Age: 83 y.o. MRN: [id number]    Subjective:  Subjective  HPI Margaret Dyer presents for swollen painful lip x few days   No insect bite , no fever,    Review of Systems  Constitutional: Negative for appetite change, diaphoresis, fatigue and unexpected weight change.  Eyes: Negative for pain, redness and visual disturbance.  Respiratory: Negative for cough, chest tightness, shortness of breath and wheezing.   Cardiovascular: Negative for chest pain, palpitations and leg swelling.  Endocrine: Negative for cold intolerance, heat intolerance, polydipsia, polyphagia and polyuria.  Genitourinary: Negative for difficulty urinating, dysuria and frequency.  Skin: Positive for color change and wound.  Neurological: Negative for dizziness, light-headedness, numbness and headaches.    History Past Medical History:  Diagnosis Date  . Asthma   . Blood transfusion   . Chronic diastolic CHF (congestive heart failure) ([place])   . CKD (chronic kidney disease), stage III ([place])   . Coronary artery calcification seen on CT scan   . Diabetes mellitus   . Goiter   . Hyperlipidemia   . Hypertension   . Hypothyroidism   . Liver cirrhosis ([place])   . NASH (nonalcoholic steatohepatitis)   . Obesity   . Osteopenia   . Pulmonary hypertension ([place])   . Renal cell carcinoma    a. prior nephrectomy.  . Right heart failure ([place])   . Thrombocytopenia ([place])     She has a past surgical history that includes Nephrectomy (09.2000); Appendectomy; Abdominal hysterectomy; Knee surgery; and Hemorrhoid surgery.   Her family history includes COPD in her father; Cancer in her father and sister; Congestive Heart Failure in her father; Coronary artery disease in her unknown relative; Diabetes in her unknown relative; Hyperlipidemia in her unknown relative; Hypertension in her unknown relative; Kidney cancer in her father.She reports that she has quit smoking. She has  never used smokeless tobacco. She reports that she does not drink alcohol or use drugs.  Current Outpatient Medications on File Prior to Visit  Medication Sig Dispense Refill  . amLODipine (NORVASC) 10 MG tablet TAKE 1 TABLET BY MOUTH EVERY MORNING 30 tablet 2  . aspirin EC 81 MG tablet Take 1 tablet (81 mg total) by mouth daily. 90 tablet 3  . furosemide (LASIX) 40 MG tablet take 1 tablet by mouth twice a day 60 tablet 3  . gabapentin (NEURONTIN) 100 MG capsule take 1 capsule by mouth twice a day (Patient taking differently: take 100 mg by mouth twice a day as needed) 60 capsule 5  . hydrALAZINE (APRESOLINE) 50 MG tablet Take 50 mg by mouth 2 (two) times daily. Taking one tablet in the morning and one half tablet in the evening    . insulin glargine (LANTUS) 100 UNIT/ML injection Inject 50 Units into the skin every evening.     . insulin glulisine (APIDRA) 100 UNIT/ML injection Inject 0.15 mLs (15 Units total) into the skin 3 (three) times daily with meals.    Marland Kitchen levothyroxine (SYNTHROID, LEVOTHROID) 25 MCG tablet take 1 tablet by mouth daily 30 tablet 5  . LORazepam (ATIVAN) 0.5 MG tablet Take 1 tablet (0.5 mg total) by mouth 2 (two) times daily as needed. 60 tablet 1  . metoprolol tartrate (LOPRESSOR) 25 MG tablet TAKE 1 TABLET BY MOUTH TWICE DAILY 180 tablet 1  . oxybutynin (DITROPAN) 5 MG tablet Take 1 tablet (5 mg total) by mouth every morning. 90 tablet 3  .  potassium chloride SA (K-DUR,KLOR-CON) 20 MEQ tablet Take 20 mEq by mouth every evening.    . traMADol (ULTRAM) 50 MG tablet TAKE 1 TABLET BY MOUTH EVERY 6 HOURS AS NEEDED FOR MODERATE PAIN 60 tablet 0   No current facility-administered medications on file prior to visit.      Objective:  Objective  Physical Exam  Constitutional: She is oriented to person, place, and time. She appears well-developed and well-nourished.  HENT:  Head: Normocephalic and atraumatic.    Eyes: Conjunctivae and EOM are normal.  Neck: Normal range of  motion. Neck supple. No JVD present. Carotid bruit is not present. No thyromegaly present.  Cardiovascular: Normal rate, regular rhythm and normal heart sounds.  No murmur heard. Pulmonary/Chest: Effort normal and breath sounds normal. No respiratory distress. She has no wheezes. She has no rales. She exhibits no tenderness.  Musculoskeletal: She exhibits no edema.  Neurological: She is alert and oriented to person, place, and time.  Psychiatric: She has a normal mood and affect.  Nursing note and vitals reviewed.     BP (!) 124/41 (BP Location: Right Arm, Cuff Size: Large)   Pulse 64   Temp 98.7 F (37.1 C) (Oral)   Resp 16   LMP  (LMP Unknown)   SpO2 94%  Wt Readings from Last 3 Encounters:  07/05/18 194 lb (88 kg)  07/01/18 194 lb (88 kg)  05/30/18 194 lb 8 oz (88.2 kg)     Lab Results  Component Value Date   WBC 8.0 07/05/2018   HGB 14.3 07/05/2018   HCT 42.4 07/05/2018   PLT 113 (L) 07/05/2018   GLUCOSE 186 (H) 05/03/2018   CHOL 160 05/28/2017   TRIG 145.0 05/28/2017   HDL 37.30 (L) 05/28/2017   LDLDIRECT 89.0 01/19/2015   LDLCALC 94 05/28/2017   ALT 24 12/17/2017   AST 24 12/17/2017   NA 139 05/03/2018   K 4.3 05/03/2018   CL 97 05/03/2018   CREATININE 1.21 (H) 05/03/2018   BUN 34 (H) 05/03/2018   CO2 26 05/03/2018   TSH 1.328 05/13/2017   INR 1.17 05/18/2017   HGBA1C 8.8 (H) 05/13/2017   MICROALBUR 52.5 (H) 05/28/2017    Mr Brain Wo Contrast  Result Date: 07/21/2018  [facility] [address] [place], [place] [address] [phone] NEUROIMAGING REPORT STUDY DATE: 07/19/2018 PATIENT NAME: Margaret Dyer DOB: 1935/05/04 MRN: [id number] EXAM: MRI Brain without contrast ORDERING CLINICIAN: Kathrynn Ducking, MD CLINICAL HISTORY: 83 year old woman with gait disturbance and memory loss COMPARISON FILMS: CT scan 05/08/2017 TECHNIQUE: MRI of the brain without contrast was obtained utilizing 5 mm axial slices with T1, T2, T2 flair, SWI and  diffusion weighted views.  T1 sagittal and T2 coronal views were obtained. CONTRAST: none IMAGING SITE: [facility], [place], [place] FINDINGS: On sagittal images, the spinal cord is imaged caudally to C4 and is normal in caliber.   The contents of the posterior fossa are of normal size and position.   The pituitary gland and optic chiasm appear normal.    There is moderate generalized cortical atrophy, superficially more than centrally..  There are no abnormal extra-axial collections of fluid.  There are T2/FLAIR hypertense foci in the pons and a small lacunar infarction in the left thalamus.  In the hemispheres, there are some scattered T2/FLAIR hyperintense foci.  None of the ischemic changes appear to be acute.  Diffusion weighted images are normal.  Susceptibility weighted images are normal.  The  orbits appear normal.   The VIIth/VIIIth nerve complex appears normal.  The mastoid air cells appear normal.  There is a mucous retention cyst in the right maxillary sinus and 1 of the right ethmoid air cells.  The other paranasal sinuses appear normal.  Flow voids are identified within the major intracerebral arteries.   The vertebrobasilar arteries in the carotid arteries are mildly dolichoectatic.Marland Kitchen  Compared to the CT scan dated 05/08/2017, there are no definite changes.   This MRI of the brain without contrast shows the following: 1.   Moderate generalized cortical atrophy. 2.   Chronic microvascular ischemic changes in the pons, left thalamus and hemispheres. 3.   There are no acute findings and no definite change compared to the 05/2017 CT scan. INTERPRETING PHYSICIAN: Richard A. Felecia Shelling, MD, PhD, FAAN Certified in  Neuroimaging by [employer] [place]:  Plan  I have discontinued Margaret Dyer. Margaret Dyer's doxycycline and Potassium Chloride ER. I am also having her start on valACYclovir and doxycycline. Additionally, I am having her maintain her insulin glargine,  aspirin EC, levothyroxine, gabapentin, oxybutynin, insulin glulisine, LORazepam, furosemide, hydrALAZINE, potassium chloride SA, amLODipine, metoprolol tartrate, and traMADol.  Meds ordered this encounter  Medications  . valACYclovir (VALTREX) 1000 MG tablet    Sig: Take 1 tablet (1,000 mg total) by mouth 2 (two) times daily.    Dispense:  20 tablet    Refill:  0  . doxycycline (VIBRA-TABS) 100 MG tablet    Sig: Take 1 tablet (100 mg total) by mouth 2 (two) times daily.    Dispense:  20 tablet    Refill:  0    Problem List Items Addressed This Visit      Unprioritized   Osteoarthritis - Primary   Relevant Orders   Pain Mgmt, Profile 8 w/Conf, U   Pain Mgmt, Tramadol w/medMATCH, U    Other Visit Diagnoses    High risk medication use       Relevant Orders   Pain Mgmt, Profile 8 w/Conf, U   Pain Mgmt, Tramadol w/medMATCH, U   Herpes zoster without complication       Relevant Medications   valACYclovir (VALTREX) 1000 MG tablet   doxycycline (VIBRA-TABS) 100 MG tablet   Cellulitis of buccal space of mouth       Relevant Medications   valACYclovir (VALTREX) 1000 MG tablet   doxycycline (VIBRA-TABS) 100 MG tablet    if redness/ pain no better in am call office first thing-- if worsens over night -- go to ER  Follow-up: Return if symptoms worsen or fail to improve.  Ann Held, DO

## 2018-08-02 ENCOUNTER — Telehealth: Payer: Self-pay | Admitting: *Deleted

## 2018-08-02 NOTE — Telephone Encounter (Signed)
Copied from [place] [id number]. Topic: General - Other >> Aug 02, 2018  9:52 AM Judyann Munson wrote: Reason for CRM: Patient is calling to update that her mouth infection is not feeling any better it is still swollen. She is needing a call back in regards.

## 2018-08-02 NOTE — Telephone Encounter (Signed)
It may take a few day but if it is no better or worse --- go to ER

## 2018-08-02 NOTE — Telephone Encounter (Signed)
Notified pt and she voices understanding. 

## 2018-08-02 NOTE — Telephone Encounter (Signed)
Pt calling back. Pt would like to know what she should do. Please advise.

## 2018-08-02 NOTE — Telephone Encounter (Signed)
Spoke with pt. She states her symptoms are no worse but they are no better. Reports that she has had 2 doses of each medication given to her yesterday and would like to know what else she should do?

## 2018-08-04 LAB — PAIN MGMT, PROFILE 8 W/CONF, U
6 Acetylmorphine: NEGATIVE ng/mL (ref <10)
AMPHETAMINES: NEGATIVE ng/mL (ref <500)
Alcohol Metabolites: NEGATIVE ng/mL (ref <500)
BENZODIAZEPINES: NEGATIVE ng/mL (ref <100)
Buprenorphine, Urine: NEGATIVE ng/mL (ref <5)
COCAINE METABOLITE: NEGATIVE ng/mL (ref <150)
Creatinine: 114.6 mg/dL
MDMA: NEGATIVE ng/mL (ref <500)
Marijuana Metabolite: NEGATIVE ng/mL (ref <20)
OXIDANT: NEGATIVE ug/mL (ref <200)
OXYCODONE: NEGATIVE ng/mL (ref <100)
Opiates: NEGATIVE ng/mL (ref <100)
pH: 6.62 (ref 4.5–9.0)

## 2018-08-04 LAB — PAIN MGMT, TRAMADOL W/MEDMATCH, U
DESMETHYLTRAMADOL: 6819 ng/mL — AB (ref <100)
TRAMADOL: 9661 ng/mL — AB (ref <100)

## 2018-08-14 ENCOUNTER — Other Ambulatory Visit: Payer: Self-pay | Admitting: Family Medicine

## 2018-08-14 DIAGNOSIS — B029 Zoster without complications: Secondary | ICD-10-CM

## 2018-08-14 DIAGNOSIS — K122 Cellulitis and abscess of mouth: Secondary | ICD-10-CM

## 2018-08-21 ENCOUNTER — Other Ambulatory Visit: Payer: Self-pay | Admitting: Family Medicine

## 2018-08-21 DIAGNOSIS — M1990 Unspecified osteoarthritis, unspecified site: Secondary | ICD-10-CM

## 2018-08-22 NOTE — Telephone Encounter (Signed)
Requesting:tramadol Contract:yes UDS:low risk next screen 02/01/19 Last OV:08/01/18 Next OV:02/06/19 Last Refill:07/16/18  #[id number] Database:   Please advise

## 2018-09-01 IMAGING — CT CT ANGIO CHEST
2 of 8 series · 18 of 36 positions shown · IV contrast (APPLIED)
Comparison: Radiographs yesterday.

CLINICAL DATA: Chest pain, dyspnea, elevated D-dimer.

EXAM:
CT ANGIOGRAPHY CHEST WITH CONTRAST
TECHNIQUE: Multidetector CT imaging of the chest was performed using the
standard protocol during bolus administration of intravenous
contrast. Multiplanar CT image reconstructions and MIPs were
obtained to evaluate the vascular anatomy.
CONTRAST:  75 cc Isovue 370 IV

[Series 6: thins · axial · 0.79mm/px · z∈[+1072,+1282]mm · 17 of 236 slices shown]
[im 13/236  lung]
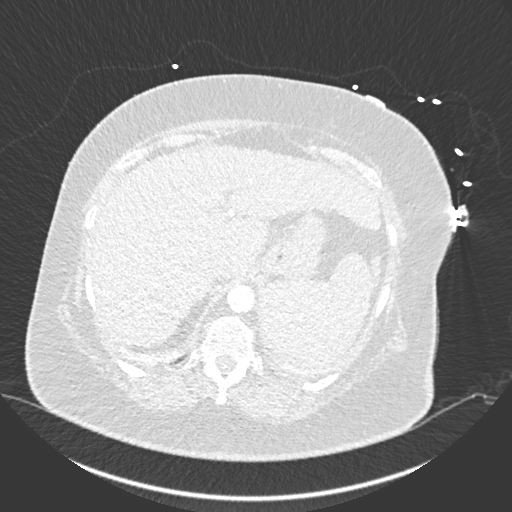
[im 25/236  mediastinal]
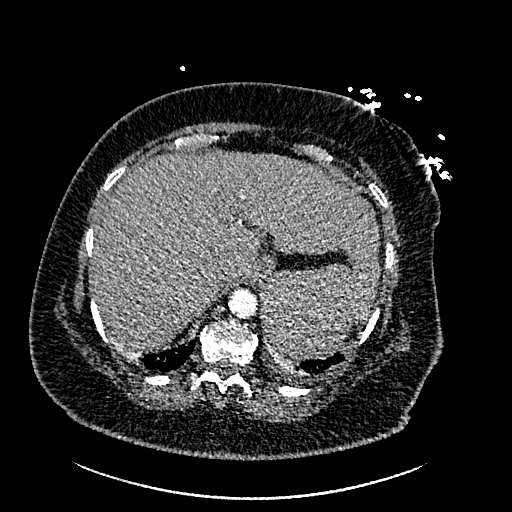
[im 38/236  lung]
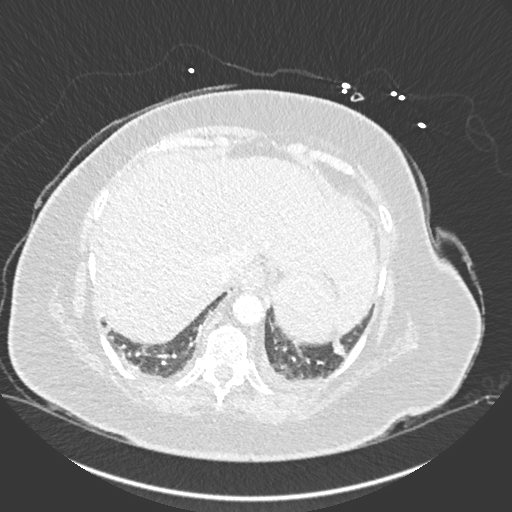
[im 50/236  mediastinal]
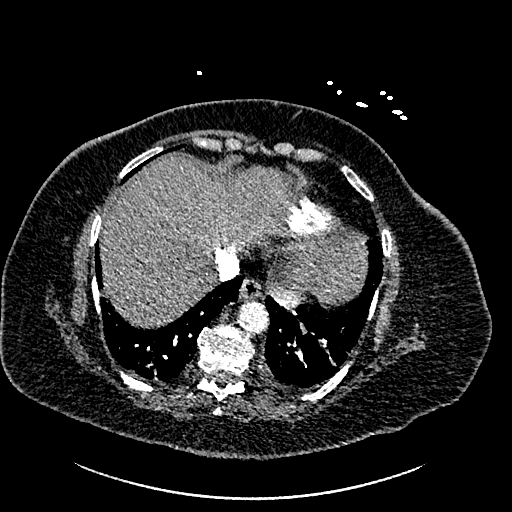
[im 62/236  lung]
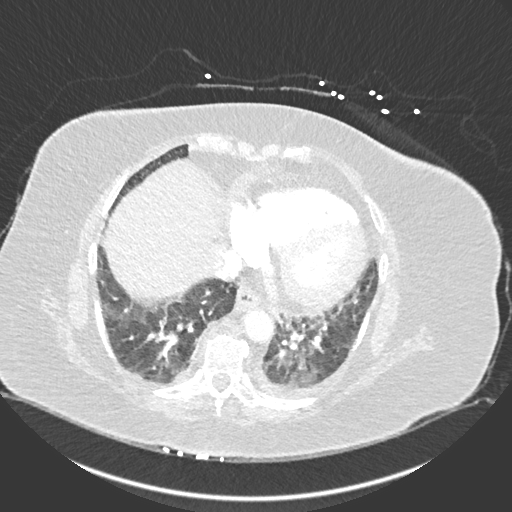
[im 75/236  mediastinal]
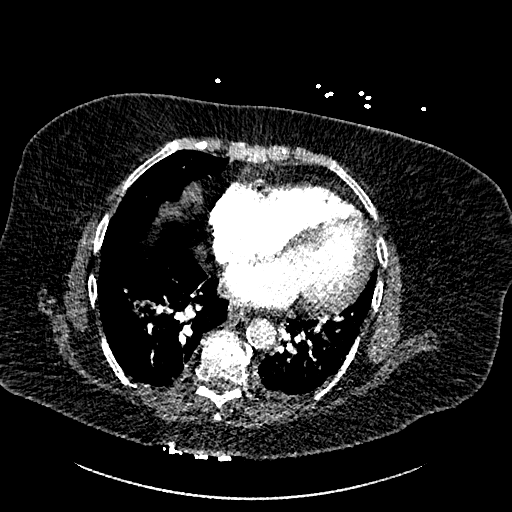
[im 87/236  lung]
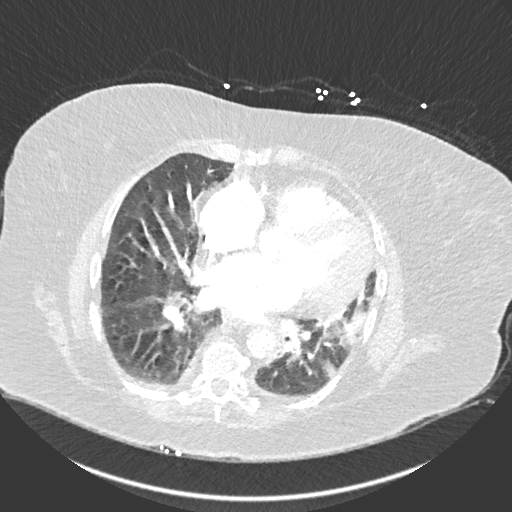
[im 99/236  mediastinal]
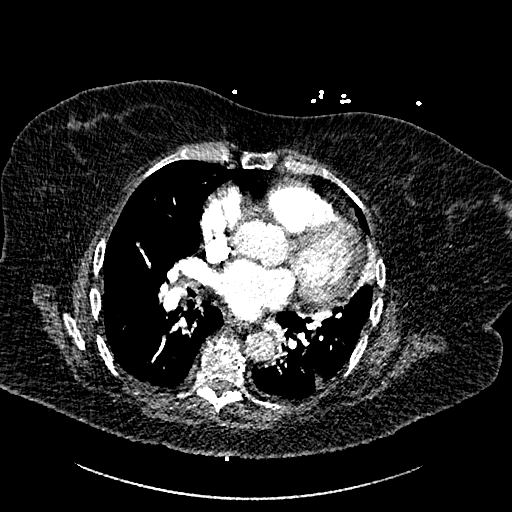
[im 124/236  lung]
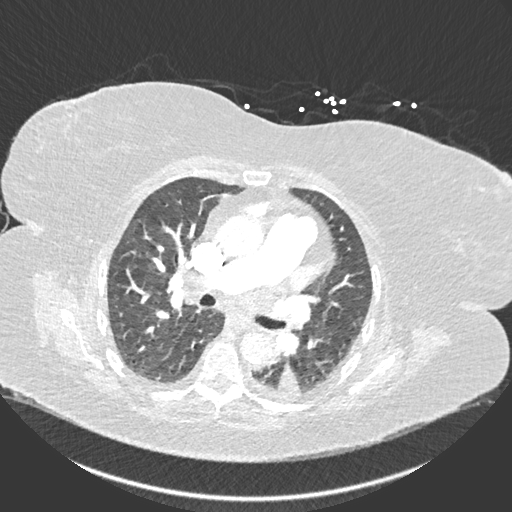
[im 137/236  mediastinal]
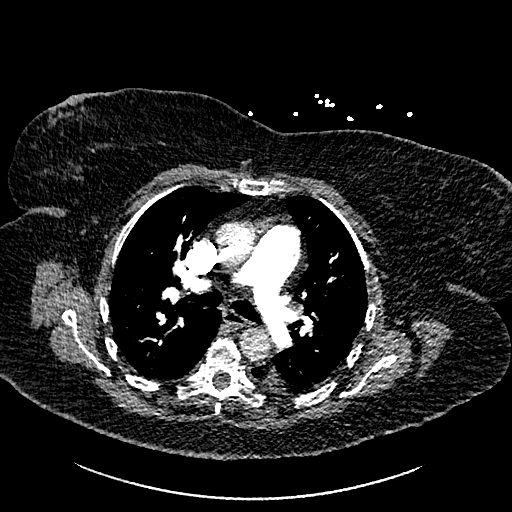
[im 149/236  lung]
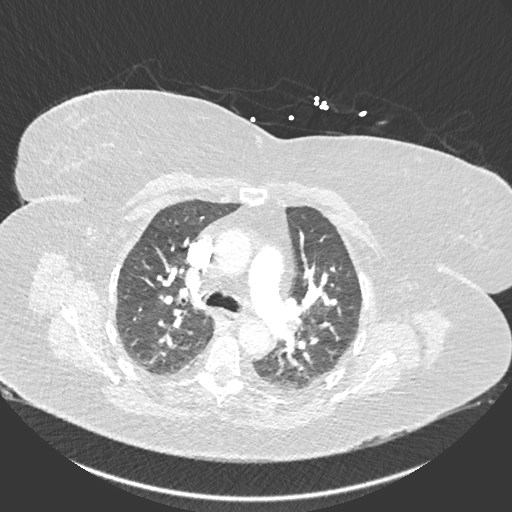
[im 161/236  mediastinal]
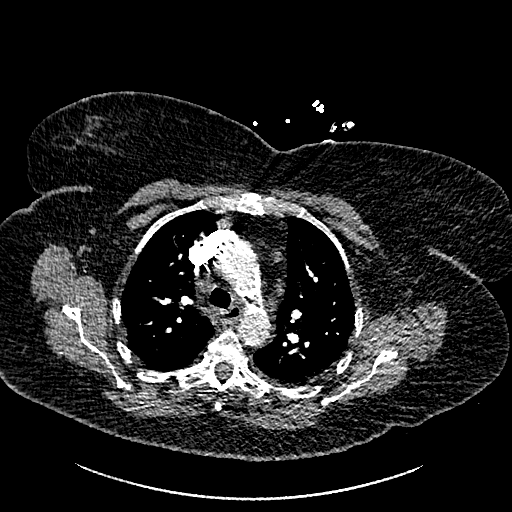
[im 174/236  lung]
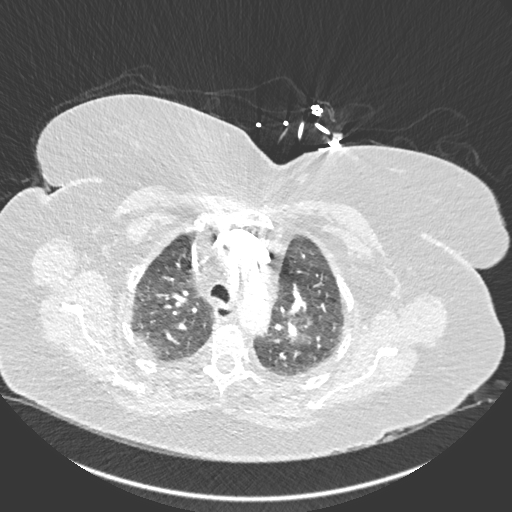
[im 186/236  mediastinal]
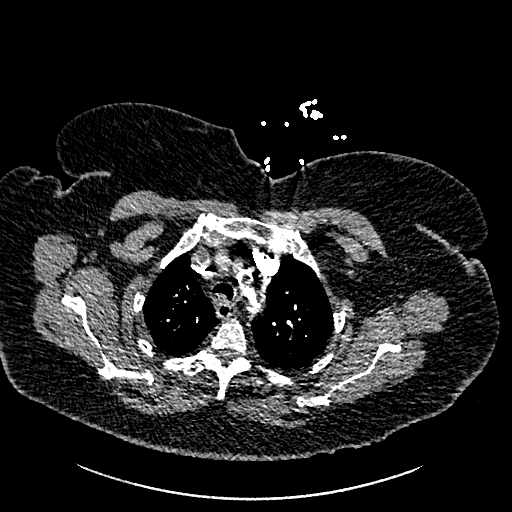
[im 198/236  lung]
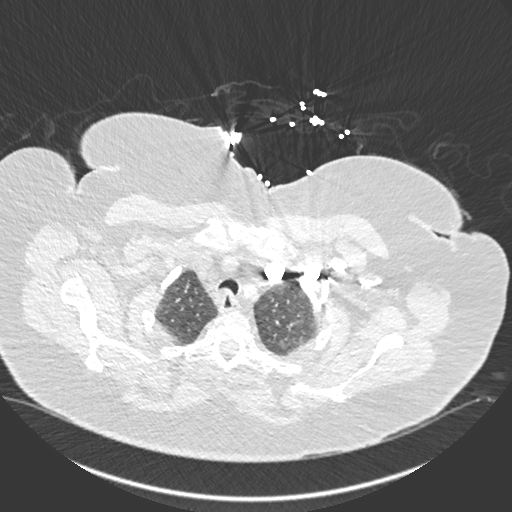
[im 211/236  mediastinal]
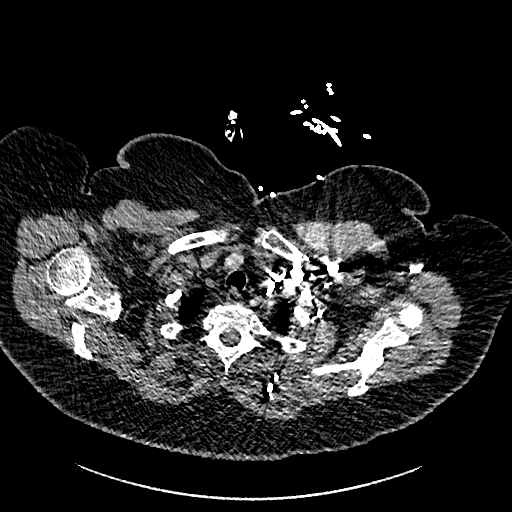
[im 223/236  lung]
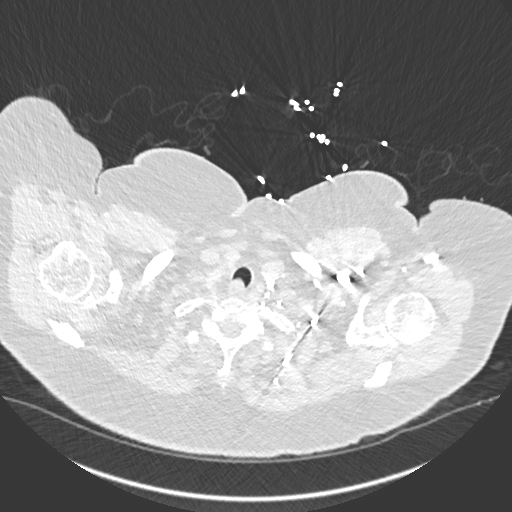

[Series 8: coronal mpr · coronal · 0.48mm/px · 1 of 129 slices shown]
[im 65/129  mediastinal]
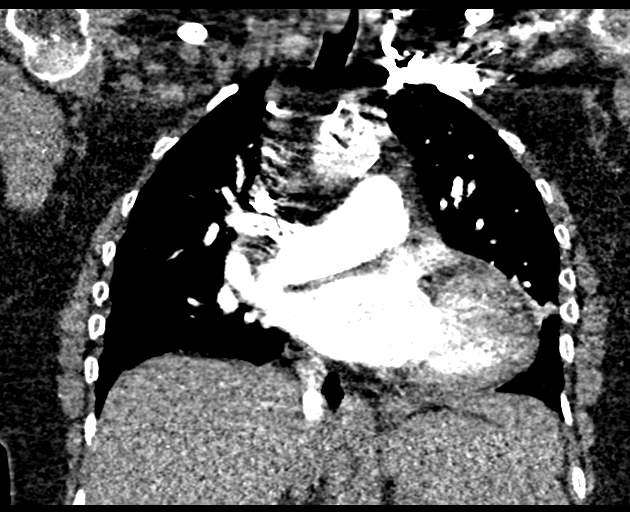

[18 of 36 positions shown; findings below may reference images not displayed]

FINDINGS: Cardiovascular: There are no filling defects within the pulmonary
arteries to suggest pulmonary embolus. Atherosclerosis of the
thoracic aorta without aneurysm or evidence of dissection. Coronary
artery calcifications. Mild multi chamber cardiomegaly. Minimal
contrast refluxing into the hepatic veins and IVC.

Mediastinum/Nodes: No mediastinal or hilar adenopathy. No
pericardial fluid. The esophagus is decompressed. Visualized thyroid
gland is normal.

Lungs/Pleura: Breathing motion artifact partially obscures
evaluation. Linear and dependent atelectasis in both lower lobes and
lingula. No confluent consolidation. No evidence of pulmonary edema.
Trace bilateral pleural effusions, left slightly greater than right.
Minimal fluid in the interlobar fissure on the left. No septal
thickening.

Upper Abdomen: Nodular hepatic contours. No upper abdominal ascites.
No acute abnormality.

Musculoskeletal: There are no acute or suspicious osseous
abnormalities. Degenerative change in the spine.

Review of the MIP images confirms the above findings.
IMPRESSION: 1. No pulmonary embolus.
2. Thoracic aortic atherosclerosis and coronary artery
calcifications. Minimal contrast refluxing into the hepatic veins
and IVC suggest elevated right heart pressures. Trace pleural
effusions.
3. Cirrhotic hepatic morphology incidentally noted in the upper
abdomen.

## 2018-09-13 ENCOUNTER — Other Ambulatory Visit: Payer: Self-pay | Admitting: Internal Medicine

## 2018-09-29 ENCOUNTER — Other Ambulatory Visit: Payer: Self-pay | Admitting: Internal Medicine

## 2018-10-03 ENCOUNTER — Ambulatory Visit: Payer: Medicare Other | Admitting: Neurology

## 2018-10-09 ENCOUNTER — Inpatient Hospital Stay: Payer: Medicare Other | Attending: Hematology & Oncology

## 2018-10-09 ENCOUNTER — Inpatient Hospital Stay: Payer: Medicare Other | Admitting: Hematology & Oncology

## 2018-10-09 DIAGNOSIS — Z23 Encounter for immunization: Secondary | ICD-10-CM | POA: Diagnosis not present

## 2018-10-21 ENCOUNTER — Other Ambulatory Visit: Payer: Self-pay | Admitting: Family Medicine

## 2018-10-21 DIAGNOSIS — M1990 Unspecified osteoarthritis, unspecified site: Secondary | ICD-10-CM

## 2018-10-22 NOTE — Telephone Encounter (Signed)
Database ran and is on your desk for review.  Last filled per database:  08/22/18 Last written: 08/22/18 Last ov: 08/01/18 Next ov: 02/06/19 Contract: 08/02/19 UDS: 02/01/19

## 2018-11-24 ENCOUNTER — Other Ambulatory Visit: Payer: Self-pay | Admitting: Family Medicine

## 2018-11-24 DIAGNOSIS — R32 Unspecified urinary incontinence: Secondary | ICD-10-CM

## 2018-11-29 DIAGNOSIS — E1122 Type 2 diabetes mellitus with diabetic chronic kidney disease: Secondary | ICD-10-CM | POA: Diagnosis not present

## 2018-11-29 DIAGNOSIS — N183 Chronic kidney disease, stage 3 (moderate): Secondary | ICD-10-CM | POA: Diagnosis not present

## 2018-11-29 DIAGNOSIS — Z794 Long term (current) use of insulin: Secondary | ICD-10-CM | POA: Diagnosis not present

## 2018-11-29 DIAGNOSIS — E785 Hyperlipidemia, unspecified: Secondary | ICD-10-CM | POA: Diagnosis not present

## 2018-11-29 DIAGNOSIS — E039 Hypothyroidism, unspecified: Secondary | ICD-10-CM | POA: Diagnosis not present

## 2018-12-18 ENCOUNTER — Other Ambulatory Visit: Payer: Self-pay | Admitting: Internal Medicine

## 2018-12-24 ENCOUNTER — Emergency Department (HOSPITAL_COMMUNITY): Payer: Medicare Other

## 2018-12-24 ENCOUNTER — Inpatient Hospital Stay (HOSPITAL_COMMUNITY)
Admission: EM | Admit: 2018-12-24 | Discharge: 2018-12-30 | DRG: 481 | Disposition: A | Discharge status: Skilled Nursing Facility | Payer: Medicare Other | Attending: Internal Medicine | Admitting: Internal Medicine

## 2018-12-24 ENCOUNTER — Encounter (HOSPITAL_COMMUNITY): Payer: Self-pay

## 2018-12-24 ENCOUNTER — Other Ambulatory Visit: Payer: Self-pay

## 2018-12-24 DIAGNOSIS — N183 Chronic kidney disease, stage 3 unspecified: Secondary | ICD-10-CM | POA: Diagnosis present

## 2018-12-24 DIAGNOSIS — Z85528 Personal history of other malignant neoplasm of kidney: Secondary | ICD-10-CM

## 2018-12-24 DIAGNOSIS — M25552 Pain in left hip: Secondary | ICD-10-CM | POA: Diagnosis not present

## 2018-12-24 DIAGNOSIS — E669 Obesity, unspecified: Secondary | ICD-10-CM | POA: Diagnosis present

## 2018-12-24 DIAGNOSIS — C649 Malignant neoplasm of unspecified kidney, except renal pelvis: Secondary | ICD-10-CM | POA: Diagnosis not present

## 2018-12-24 DIAGNOSIS — J9621 Acute and chronic respiratory failure with hypoxia: Secondary | ICD-10-CM | POA: Diagnosis not present

## 2018-12-24 DIAGNOSIS — R062 Wheezing: Secondary | ICD-10-CM | POA: Diagnosis not present

## 2018-12-24 DIAGNOSIS — R0902 Hypoxemia: Secondary | ICD-10-CM | POA: Diagnosis not present

## 2018-12-24 DIAGNOSIS — I159 Secondary hypertension, unspecified: Secondary | ICD-10-CM | POA: Diagnosis not present

## 2018-12-24 DIAGNOSIS — G9009 Other idiopathic peripheral autonomic neuropathy: Secondary | ICD-10-CM | POA: Diagnosis not present

## 2018-12-24 DIAGNOSIS — Z881 Allergy status to other antibiotic agents status: Secondary | ICD-10-CM

## 2018-12-24 DIAGNOSIS — K7460 Unspecified cirrhosis of liver: Secondary | ICD-10-CM | POA: Diagnosis present

## 2018-12-24 DIAGNOSIS — J189 Pneumonia, unspecified organism: Secondary | ICD-10-CM

## 2018-12-24 DIAGNOSIS — E1122 Type 2 diabetes mellitus with diabetic chronic kidney disease: Secondary | ICD-10-CM | POA: Diagnosis present

## 2018-12-24 DIAGNOSIS — R410 Disorientation, unspecified: Secondary | ICD-10-CM | POA: Diagnosis not present

## 2018-12-24 DIAGNOSIS — W010XXA Fall on same level from slipping, tripping and stumbling without subsequent striking against object, initial encounter: Secondary | ICD-10-CM | POA: Diagnosis present

## 2018-12-24 DIAGNOSIS — Z6833 Body mass index (BMI) 33.0-33.9, adult: Secondary | ICD-10-CM | POA: Diagnosis not present

## 2018-12-24 DIAGNOSIS — E119 Type 2 diabetes mellitus without complications: Secondary | ICD-10-CM | POA: Diagnosis not present

## 2018-12-24 DIAGNOSIS — I10 Essential (primary) hypertension: Secondary | ICD-10-CM | POA: Diagnosis not present

## 2018-12-24 DIAGNOSIS — S0990XA Unspecified injury of head, initial encounter: Secondary | ICD-10-CM | POA: Diagnosis not present

## 2018-12-24 DIAGNOSIS — Z79890 Hormone replacement therapy (postmenopausal): Secondary | ICD-10-CM

## 2018-12-24 DIAGNOSIS — M8580 Other specified disorders of bone density and structure, unspecified site: Secondary | ICD-10-CM | POA: Diagnosis present

## 2018-12-24 DIAGNOSIS — R52 Pain, unspecified: Secondary | ICD-10-CM | POA: Diagnosis not present

## 2018-12-24 DIAGNOSIS — S7292XA Unspecified fracture of left femur, initial encounter for closed fracture: Secondary | ICD-10-CM | POA: Diagnosis not present

## 2018-12-24 DIAGNOSIS — Z833 Family history of diabetes mellitus: Secondary | ICD-10-CM

## 2018-12-24 DIAGNOSIS — I959 Hypotension, unspecified: Secondary | ICD-10-CM | POA: Diagnosis not present

## 2018-12-24 DIAGNOSIS — I5032 Chronic diastolic (congestive) heart failure: Secondary | ICD-10-CM | POA: Diagnosis present

## 2018-12-24 DIAGNOSIS — Z7982 Long term (current) use of aspirin: Secondary | ICD-10-CM

## 2018-12-24 DIAGNOSIS — Z794 Long term (current) use of insulin: Secondary | ICD-10-CM

## 2018-12-24 DIAGNOSIS — Z87891 Personal history of nicotine dependence: Secondary | ICD-10-CM

## 2018-12-24 DIAGNOSIS — I5082 Biventricular heart failure: Secondary | ICD-10-CM | POA: Diagnosis present

## 2018-12-24 DIAGNOSIS — E039 Hypothyroidism, unspecified: Secondary | ICD-10-CM | POA: Diagnosis present

## 2018-12-24 DIAGNOSIS — S299XXA Unspecified injury of thorax, initial encounter: Secondary | ICD-10-CM | POA: Diagnosis not present

## 2018-12-24 DIAGNOSIS — W19XXXD Unspecified fall, subsequent encounter: Secondary | ICD-10-CM | POA: Diagnosis not present

## 2018-12-24 DIAGNOSIS — R32 Unspecified urinary incontinence: Secondary | ICD-10-CM | POA: Diagnosis not present

## 2018-12-24 DIAGNOSIS — R0602 Shortness of breath: Secondary | ICD-10-CM | POA: Diagnosis not present

## 2018-12-24 DIAGNOSIS — I2729 Other secondary pulmonary hypertension: Secondary | ICD-10-CM | POA: Diagnosis present

## 2018-12-24 DIAGNOSIS — R251 Tremor, unspecified: Secondary | ICD-10-CM | POA: Diagnosis not present

## 2018-12-24 DIAGNOSIS — M792 Neuralgia and neuritis, unspecified: Secondary | ICD-10-CM | POA: Diagnosis not present

## 2018-12-24 DIAGNOSIS — S72142A Displaced intertrochanteric fracture of left femur, initial encounter for closed fracture: Principal | ICD-10-CM

## 2018-12-24 DIAGNOSIS — I272 Pulmonary hypertension, unspecified: Secondary | ICD-10-CM | POA: Diagnosis not present

## 2018-12-24 DIAGNOSIS — S7222XA Displaced subtrochanteric fracture of left femur, initial encounter for closed fracture: Secondary | ICD-10-CM | POA: Diagnosis present

## 2018-12-24 DIAGNOSIS — I2510 Atherosclerotic heart disease of native coronary artery without angina pectoris: Secondary | ICD-10-CM | POA: Diagnosis present

## 2018-12-24 DIAGNOSIS — R202 Paresthesia of skin: Secondary | ICD-10-CM | POA: Diagnosis not present

## 2018-12-24 DIAGNOSIS — Y92480 Sidewalk as the place of occurrence of the external cause: Secondary | ICD-10-CM

## 2018-12-24 DIAGNOSIS — Z4789 Encounter for other orthopedic aftercare: Secondary | ICD-10-CM | POA: Diagnosis not present

## 2018-12-24 DIAGNOSIS — S72102A Unspecified trochanteric fracture of left femur, initial encounter for closed fracture: Secondary | ICD-10-CM | POA: Diagnosis present

## 2018-12-24 DIAGNOSIS — Z79899 Other long term (current) drug therapy: Secondary | ICD-10-CM

## 2018-12-24 DIAGNOSIS — M25562 Pain in left knee: Secondary | ICD-10-CM | POA: Diagnosis not present

## 2018-12-24 DIAGNOSIS — D696 Thrombocytopenia, unspecified: Secondary | ICD-10-CM | POA: Diagnosis present

## 2018-12-24 DIAGNOSIS — Z90710 Acquired absence of both cervix and uterus: Secondary | ICD-10-CM

## 2018-12-24 DIAGNOSIS — S72002A Fracture of unspecified part of neck of left femur, initial encounter for closed fracture: Secondary | ICD-10-CM | POA: Diagnosis present

## 2018-12-24 DIAGNOSIS — E785 Hyperlipidemia, unspecified: Secondary | ICD-10-CM | POA: Diagnosis present

## 2018-12-24 DIAGNOSIS — Z825 Family history of asthma and other chronic lower respiratory diseases: Secondary | ICD-10-CM

## 2018-12-24 DIAGNOSIS — Z8051 Family history of malignant neoplasm of kidney: Secondary | ICD-10-CM

## 2018-12-24 DIAGNOSIS — Z905 Acquired absence of kidney: Secondary | ICD-10-CM

## 2018-12-24 DIAGNOSIS — I130 Hypertensive heart and chronic kidney disease with heart failure and stage 1 through stage 4 chronic kidney disease, or unspecified chronic kidney disease: Secondary | ICD-10-CM | POA: Diagnosis present

## 2018-12-24 DIAGNOSIS — S8992XA Unspecified injury of left lower leg, initial encounter: Secondary | ICD-10-CM | POA: Diagnosis not present

## 2018-12-24 DIAGNOSIS — E559 Vitamin D deficiency, unspecified: Secondary | ICD-10-CM | POA: Diagnosis not present

## 2018-12-24 DIAGNOSIS — E869 Volume depletion, unspecified: Secondary | ICD-10-CM | POA: Diagnosis not present

## 2018-12-24 DIAGNOSIS — J45909 Unspecified asthma, uncomplicated: Secondary | ICD-10-CM | POA: Diagnosis present

## 2018-12-24 DIAGNOSIS — K7581 Nonalcoholic steatohepatitis (NASH): Secondary | ICD-10-CM | POA: Diagnosis present

## 2018-12-24 DIAGNOSIS — Z8701 Personal history of pneumonia (recurrent): Secondary | ICD-10-CM | POA: Diagnosis not present

## 2018-12-24 DIAGNOSIS — G9340 Encephalopathy, unspecified: Secondary | ICD-10-CM | POA: Diagnosis not present

## 2018-12-24 DIAGNOSIS — S72002D Fracture of unspecified part of neck of left femur, subsequent encounter for closed fracture with routine healing: Secondary | ICD-10-CM | POA: Diagnosis not present

## 2018-12-24 DIAGNOSIS — J984 Other disorders of lung: Secondary | ICD-10-CM | POA: Diagnosis not present

## 2018-12-24 DIAGNOSIS — K5900 Constipation, unspecified: Secondary | ICD-10-CM | POA: Diagnosis not present

## 2018-12-24 DIAGNOSIS — W19XXXA Unspecified fall, initial encounter: Secondary | ICD-10-CM | POA: Diagnosis not present

## 2018-12-24 DIAGNOSIS — Z419 Encounter for procedure for purposes other than remedying health state, unspecified: Secondary | ICD-10-CM

## 2018-12-24 DIAGNOSIS — S72042A Displaced fracture of base of neck of left femur, initial encounter for closed fracture: Secondary | ICD-10-CM | POA: Diagnosis not present

## 2018-12-24 DIAGNOSIS — Z8249 Family history of ischemic heart disease and other diseases of the circulatory system: Secondary | ICD-10-CM

## 2018-12-24 LAB — CBC WITH DIFFERENTIAL/PLATELET
Abs Immature Granulocytes: 0.13 10*3/uL — ABNORMAL HIGH (ref 0.00–0.07)
BASOS ABS: 0 10*3/uL (ref 0.0–0.1)
BASOS PCT: 0 %
EOS ABS: 0 10*3/uL (ref 0.0–0.5)
EOS PCT: 0 %
HEMATOCRIT: 44.6 % (ref 36.0–46.0)
Hemoglobin: 14.7 g/dL (ref 12.0–15.0)
IMMATURE GRANULOCYTES: 1 %
Lymphocytes Relative: 6 %
Lymphs Abs: 0.7 10*3/uL (ref 0.7–4.0)
MCH: 29.3 pg (ref 26.0–34.0)
MCHC: 33 g/dL (ref 30.0–36.0)
MCV: 88.8 fL (ref 80.0–100.0)
Monocytes Absolute: 0.9 10*3/uL (ref 0.1–1.0)
Monocytes Relative: 7 %
NEUTROS PCT: 86 %
NRBC: 0 % (ref 0.0–0.2)
Neutro Abs: 11 10*3/uL — ABNORMAL HIGH (ref 1.7–7.7)
PLATELETS: 122 10*3/uL — AB (ref 150–400)
RBC: 5.02 MIL/uL (ref 3.87–5.11)
RDW: 13.2 % (ref 11.5–15.5)
WBC: 12.8 10*3/uL — AB (ref 4.0–10.5)

## 2018-12-24 LAB — TYPE AND SCREEN
ABO/RH(D): A POS
ANTIBODY SCREEN: NEGATIVE

## 2018-12-24 LAB — BASIC METABOLIC PANEL
Anion gap: 11 (ref 5–15)
BUN: 30 mg/dL — ABNORMAL HIGH (ref 8–23)
CALCIUM: 9.3 mg/dL (ref 8.9–10.3)
CO2: 24 mmol/L (ref 22–32)
CREATININE: 1.15 mg/dL — AB (ref 0.44–1.00)
Chloride: 104 mmol/L (ref 98–111)
GFR, EST AFRICAN AMERICAN: 52 mL/min — AB (ref >60)
GFR, EST NON AFRICAN AMERICAN: 45 mL/min — AB (ref >60)
Glucose, Bld: 199 mg/dL — ABNORMAL HIGH (ref 70–99)
Potassium: 4.1 mmol/L (ref 3.5–5.1)
SODIUM: 139 mmol/L (ref 135–145)

## 2018-12-24 LAB — ABO/RH: ABO/RH(D): A POS

## 2018-12-24 LAB — CBG MONITORING, ED
Glucose-Capillary: 194 mg/dL — ABNORMAL HIGH (ref 70–99)
Glucose-Capillary: 201 mg/dL — ABNORMAL HIGH (ref 70–99)

## 2018-12-24 MED ORDER — INSULIN ASPART 100 UNIT/ML ~~LOC~~ SOLN
0.0000 [IU] | SUBCUTANEOUS | Status: DC
  Administered 2018-12-24: 3 [IU] via SUBCUTANEOUS

## 2018-12-24 MED ORDER — HYDRALAZINE HCL 25 MG PO TABS
25.0000 mg | ORAL_TABLET | Freq: Three times a day (TID) | ORAL | Status: DC
  Administered 2018-12-24 – 2018-12-30 (×15): 25 mg via ORAL
  Filled 2018-12-24 (×17): qty 1

## 2018-12-24 MED ORDER — HYDROCODONE-ACETAMINOPHEN 5-325 MG PO TABS: 1.0000 | ORAL_TABLET | Freq: Four times a day (QID) | ORAL | Status: DC | PRN

## 2018-12-24 MED ORDER — HYDROMORPHONE HCL 1 MG/ML IJ SOLN
0.5000 mg | Freq: Once | INTRAMUSCULAR | Status: AC
Start: 2018-12-24 — End: 2018-12-24
  Administered 2018-12-24: 0.5 mg via INTRAVENOUS
  Filled 2018-12-24: qty 1

## 2018-12-24 MED ORDER — AMLODIPINE BESYLATE 10 MG PO TABS
10.0000 mg | ORAL_TABLET | Freq: Every morning | ORAL | Status: DC
  Administered 2018-12-26 – 2018-12-30 (×5): 10 mg via ORAL
  Filled 2018-12-24 (×5): qty 1
  Filled 2018-12-24: qty 2

## 2018-12-24 MED ORDER — MORPHINE SULFATE (PF) 2 MG/ML IV SOLN: 0.5000 mg | INTRAVENOUS | Status: DC | PRN

## 2018-12-24 MED ORDER — HYDROMORPHONE HCL 1 MG/ML IJ SOLN
0.5000 mg | INTRAMUSCULAR | Status: DC | PRN
  Administered 2018-12-24: 0.5 mg via INTRAVENOUS
  Administered 2018-12-25 (×2): 1 mg via INTRAVENOUS
  Filled 2018-12-24 (×3): qty 1

## 2018-12-24 MED ORDER — LEVOTHYROXINE SODIUM 25 MCG PO TABS
25.0000 ug | ORAL_TABLET | Freq: Every day | ORAL | Status: DC
  Administered 2018-12-25 – 2018-12-30 (×6): 25 ug via ORAL
  Filled 2018-12-24 (×6): qty 1

## 2018-12-24 MED ORDER — INSULIN GLARGINE 100 UNIT/ML ~~LOC~~ SOLN
10.0000 [IU] | Freq: Every day | SUBCUTANEOUS | Status: DC
  Administered 2018-12-24 – 2018-12-30 (×7): 10 [IU] via SUBCUTANEOUS
  Filled 2018-12-24 (×7): qty 0.1

## 2018-12-24 MED ORDER — FENTANYL CITRATE (PF) 100 MCG/2ML IJ SOLN
100.0000 ug | Freq: Once | INTRAMUSCULAR | Status: AC
  Administered 2018-12-24: 100 ug via INTRAVENOUS
  Filled 2018-12-24: qty 2

## 2018-12-24 MED ORDER — OXYBUTYNIN CHLORIDE 5 MG PO TABS
5.0000 mg | ORAL_TABLET | Freq: Every morning | ORAL | Status: DC
  Administered 2018-12-26 – 2018-12-30 (×5): 5 mg via ORAL
  Filled 2018-12-24 (×6): qty 1

## 2018-12-24 MED ORDER — ONDANSETRON HCL 4 MG/2ML IJ SOLN
4.0000 mg | Freq: Once | INTRAMUSCULAR | Status: AC
  Administered 2018-12-24: 4 mg via INTRAVENOUS
  Filled 2018-12-24: qty 2

## 2018-12-24 MED ORDER — METOPROLOL TARTRATE 25 MG PO TABS
25.0000 mg | ORAL_TABLET | Freq: Two times a day (BID) | ORAL | Status: DC
  Administered 2018-12-24 – 2018-12-30 (×12): 25 mg via ORAL
  Filled 2018-12-24 (×13): qty 1

## 2018-12-24 NOTE — Progress Notes (Signed)
I have discussed the patient with the emergency department providers.  Margaret Dyer does have a operative injury to her left hip.  We will plan for operative management of this tomorrow afternoon.  Please ensure that she is nothing by mouth tonight at midnight.  Also hold any chemical DVT prophylaxis.  Consult note to come tomorrow.

## 2018-12-24 NOTE — ED Triage Notes (Signed)
Per [facility], pt from home w/ a c/o left sided hip pain and possible hip fracture/dislocation that she sustained from a fall. Pt tripped onto a concrete sidewalk on her left hip. Shortening and lateral rotation noted to her L leg. Pulses and motor function intact though pt complains of some numbness to her left foot. No LOC or dizziness. No head, neck, or back pain. No blood thinners. No hip binder or stabilization.   136/84 HR 60 96 % RA 8/10 on pain after meds  20g IV L AC 100 mcg Fentanyl

## 2018-12-24 NOTE — ED Provider Notes (Signed)
[facility] EMERGENCY DEPARTMENT Provider Note   CSN: [id number] Arrival date & time: 12/24/18  1859     History   Chief Complaint Chief Complaint  Patient presents with  . Hip Pain    HPI Margaret Dyer is a 84 y.o. female with past medical history of CHF, CKD, hypertension, diabetes, status post nephrectomy, presenting to the emergency department with complaint of cute onset of left hip pain after mechanical fall that occurred prior to arrival.  Patient states she was walking and thinks she tripped over her other foot, falling onto her left hip.  Had immediate pain in the left hip, that is worse with any movement or palpation.  She did not hit her head or pass out.  She does have a scrape to her right elbow.  Not on anticoagulation.  No other injuries reported.  Denies any new neck or back pain.  Previous injuries to left hip.  Orthopedic specialist is Dr. Maxie Better, who sees her for her knees.  The history is provided by the patient.    Past Medical History:  Diagnosis Date  . Asthma   . Blood transfusion   . Chronic diastolic CHF (congestive heart failure) ([place])   . CKD (chronic kidney disease), stage III ([place])   . Coronary artery calcification seen on CT scan   . Diabetes mellitus   . Goiter   . Hyperlipidemia   . Hypertension   . Hypothyroidism   . Liver cirrhosis ([place])   . NASH (nonalcoholic steatohepatitis)   . Obesity   . Osteopenia   . Pulmonary hypertension ([place])   . Renal cell carcinoma    a. prior nephrectomy.  . Right heart failure ([place])   . Thrombocytopenia [facility])     Patient Active Problem List   Diagnosis Date Noted  . Closed left hip fracture ([place]) 12/24/2018  . Dysuria 12/17/2017  . Dizziness 12/17/2017  . Community acquired pneumonia 12/17/2017  . Pneumonia 12/01/2017  . Urinary incontinence 11/09/2017  . Tic 11/09/2017  . CKD (chronic kidney disease), stage III ([place]) 09/12/2017  . Pulmonary hypertension ([place]) 09/12/2017  .  Chronic diastolic CHF (congestive heart failure) ([place]) 06/05/2017  . Oral candida 05/29/2017  . Vitamin D deficiency 05/29/2017  . Acute on chronic respiratory failure with hypoxia ([place]) 05/24/2017  . Anxiety 05/24/2017  . HCAP (healthcare-associated pneumonia) 05/22/2017  . Right heart failure ([place]) 05/17/2017  . Acute diastolic CHF (congestive heart failure) ([place])   . Exertional dyspnea 05/13/2017  . Hypertension 05/13/2017  . Asthma 05/13/2017  . DOE (dyspnea on exertion) 05/13/2017  . Diabetes mellitus type 2, insulin dependent ([place]) 05/13/2017  . Acute kidney injury ([place]) 05/13/2017  . History of nephrectomy 05/13/2017  . Hyperlipidemia LDL goal <70 05/13/2017  . Liver cirrhosis secondary to NASH ([place]) 05/13/2017  . Thrombocytopenia ([place]) 05/13/2017  . Renal cell carcinoma 05/13/2017  . Hypothyroidism 05/13/2017  . Hypersplenism 05/13/2017  . Osteoarthritis 05/13/2017  . Overactive bladder 05/13/2017  . URI, acute 02/22/2017  . Abrasion, left knee, initial encounter 02/22/2017  . CTS (carpal tunnel syndrome) 03/04/2016  . Bilateral contusion of ribs 10/05/2015  . Dental abscess 04/15/2014  . Claudication, intermittent ([place]) 12/05/2013  . Obesity (BMI 30-39.9) 07/22/2013  . Back pain 05/26/2013  . Platelets decreased ([place]) 05/26/2013  . Breast pain, right 04/21/2013  . Anxiety and depression 12/05/2012  . Bronchitis 11/06/2012  . HEMATURIA UNSPECIFIED 02/17/2011  . Hyperlipidemia 09/08/2010  . B12 DEFICIENCY 07/22/2010  . DIZZINESS 07/04/2010  .  Essential hypertension 06/21/2010  . GOUT, UNSPECIFIED 01/27/2010  . NEPHRECTOMY, HX OF 12/02/2009  . NECK PAIN, LEFT 09/04/2008  . PARESTHESIA 09/02/2007  . GOITER NOS 04/23/2007  . Diabetes mellitus ([place]) 04/23/2007  . Asthma 04/23/2007  . CALCULUS, KIDNEY 04/23/2007  . OSTEOPENIA 04/23/2007  . CARCINOMA, RENAL CELL 08/10/1999    Past Surgical History:  Procedure Laterality Date  . ABDOMINAL HYSTERECTOMY    .  APPENDECTOMY    . HEMORRHOID SURGERY    . KNEE SURGERY    . NEPHRECTOMY  09.2000     OB History   No obstetric history on file.      Home Medications    Prior to Admission medications   Medication Sig Start Date End Date Taking? Authorizing Provider  amLODipine (NORVASC) 10 MG tablet TAKE 1 TABLET BY MOUTH EVERY MORNING 09/30/18   Hilty, Nadean Corwin, MD  aspirin EC 81 MG tablet Take 1 tablet (81 mg total) by mouth daily. 06/05/17   Dunn, Nedra Hai, PA-C  doxycycline (VIBRA-TABS) 100 MG tablet Take 1 tablet (100 mg total) by mouth 2 (two) times daily. 08/01/18   Roma Schanz R, DO  furosemide (LASIX) 40 MG tablet TAKE 1 TABLET BY MOUTH TWICE A DAY 09/13/18   Hilty, Nadean Corwin, MD  gabapentin (NEURONTIN) 100 MG capsule take 1 capsule by mouth twice a day Patient taking differently: take 100 mg by mouth twice a day as needed 11/07/17   Carollee Herter, Kendrick Fries R, DO  hydrALAZINE (APRESOLINE) 50 MG tablet TAKE 1/2 TABLET BY MOUTH EVERY 8 HOURS 12/18/18   Hilty, Nadean Corwin, MD  insulin glargine (LANTUS) 100 UNIT/ML injection Inject 50 Units into the skin every evening.     [provider]  insulin glulisine (APIDRA) 100 UNIT/ML injection Inject 0.15 mLs (15 Units total) into the skin 3 (three) times daily with meals. 12/05/17   Domenic Polite, MD  levothyroxine (SYNTHROID, LEVOTHROID) 25 MCG tablet take 1 tablet by mouth daily 07/13/17   Carollee Herter, Kendrick Fries R, DO  LORazepam (ATIVAN) 0.5 MG tablet Take 1 tablet (0.5 mg total) by mouth 2 (two) times daily as needed. 01/25/18   Roma Schanz R, DO  metoprolol tartrate (LOPRESSOR) 25 MG tablet TAKE 1 TABLET BY MOUTH TWICE DAILY 07/12/18   Carollee Herter, Kendrick Fries R, DO  oxybutynin (DITROPAN) 5 MG tablet TAKE 1 TABLET BY MOUTH EVERY MORNING 11/25/18   Carollee Herter, Alferd Apa, DO  potassium chloride SA (K-DUR,KLOR-CON) 20 MEQ tablet Take 20 mEq by mouth every evening.    [provider]  traMADol (ULTRAM) 50 MG tablet TAKE 1 TABLET BY MOUTH  EVERY 6 HOURS AS NEEDED FOR MODERATE PAIN 10/23/18   Carollee Herter, Alferd Apa, DO  valACYclovir (VALTREX) 1000 MG tablet TAKE 1 TABLET(1000 MG) BY MOUTH TWICE DAILY 08/16/18   Ann Held, DO    Family History Family History  Problem Relation Age of Onset  . Cancer Sister        bladder  . Kidney cancer Father   . Cancer Father        renal  . COPD Father   . Congestive Heart Failure Father   . Coronary artery disease Other   . Diabetes Other   . Hyperlipidemia Other   . Hypertension Other   . Rheumatologic disease Neg Hx     Social History Social History   Tobacco Use  . Smoking status: Former Research scientist (life sciences)  . Smokeless tobacco: Never Used  . Tobacco comment:  05/30/18 none in 40 years  Substance Use Topics  . Alcohol use: No    Alcohol/week: 0.0 standard drinks  . Drug use: No     Allergies   Cefuroxime axetil and Ciprofloxacin   Review of Systems Review of Systems  Musculoskeletal: Positive for arthralgias.  Neurological: Negative for syncope and headaches.  Hematological: Does not bruise/bleed easily.  All other systems reviewed and are negative.    Physical Exam Updated Vital Signs BP (!) 158/61   Pulse 79   Temp 98 F (36.7 C) (Oral)   Resp 16   Ht 5' 3.5" (1.613 m)   Wt 86.2 kg   LMP  (LMP Unknown)   SpO2 94%   BMI 33.13 kg/m   Physical Exam Vitals signs and nursing note reviewed.  Constitutional:      General: She is not in acute distress.    Appearance: She is well-developed.  HENT:     Head: Normocephalic and atraumatic.  Eyes:     Conjunctiva/sclera: Conjunctivae normal.  Neck:     Musculoskeletal: Normal range of motion.  Cardiovascular:     Rate and Rhythm: Normal rate and regular rhythm.  Pulmonary:     Effort: Pulmonary effort is normal.     Breath sounds: Normal breath sounds.  Abdominal:     General: Bowel sounds are normal.     Palpations: Abdomen is soft.     Tenderness: There is no abdominal tenderness.    Musculoskeletal:     Comments: Left leg appears shortened and externally rotated.  Pelvis is stable, however with tenderness to palpation of the left hip.  Normal internal and external rotation of the right hip.  Knees and ankles appear atraumatic. Feet are warm with normal capillary refill.  Normal PT pulses   There is a small superficial abrasion to the right elbow.  No swelling or deformity.  Normal range of motion of the right elbow and wrist.  Left upper extremity is atraumatic.      Skin:    General: Skin is warm.  Neurological:     Mental Status: She is alert and oriented to person, place, and time.     Comments: Normal sensation to bilateral lower extremities.  Psychiatric:        Behavior: Behavior normal.      ED Treatments / Results  Labs (all labs ordered are listed, but only abnormal results are displayed) Labs Reviewed  CBC WITH DIFFERENTIAL/PLATELET - Abnormal; Notable for the following components:      Result Value   WBC 12.8 (*)    Platelets 122 (*)    Neutro Abs 11.0 (*)    Abs Immature Granulocytes 0.13 (*)    All other components within normal limits  CBG MONITORING, ED - Abnormal; Notable for the following components:   Glucose-Capillary 194 (*)    All other components within normal limits  BASIC METABOLIC PANEL  TYPE AND SCREEN    EKG None  Radiology Dg Chest 1 View  Result Date: 12/24/2018 CLINICAL DATA:  Status post fall. EXAM: CHEST  1 VIEW COMPARISON:  12/03/2017 FINDINGS: Enlarged cardiac silhouette. Calcific atherosclerotic disease of the aorta. There is no evidence of focal airspace consolidation, pleural effusion or pneumothorax. Stable left lung scarring. Osseous structures are without acute abnormality. Soft tissues are grossly normal. IMPRESSION: Enlarged cardiac silhouette. Calcific atherosclerotic disease of the aorta. Electronically Signed   By: Fidela Salisbury M.D.   On: 12/24/2018 20:31   Dg Hip Unilat With Pelvis  2-3 Views  Left  Result Date: 12/24/2018 CLINICAL DATA:  Fall.  Hip pain EXAM: DG HIP (WITH OR WITHOUT PELVIS) 2-3V LEFT COMPARISON:  None. FINDINGS: There is an acute and comminuted fracture deformity involving the intertrochanteric portion of the proximal left femur. Medial angulation of the distal fracture fragments identified. IMPRESSION: 1. Acute and comminuted fracture deformity involves the intertrochanteric portions of the proximal left femur. Electronically Signed   By: Kerby Moors M.D.   On: 12/24/2018 20:31    Procedures Procedures (including critical care time)  Medications Ordered in ED Medications  insulin aspart (novoLOG) injection 0-9 Units (has no administration in time range)  HYDROmorphone (DILAUDID) injection 0.5-1 mg (has no administration in time range)  insulin glargine (LANTUS) injection 10 Units (has no administration in time range)  fentaNYL (SUBLIMAZE) injection 100 mcg (100 mcg Intravenous Given 12/24/18 1956)  ondansetron (ZOFRAN) injection 4 mg (4 mg Intravenous Given 12/24/18 2108)  HYDROmorphone (DILAUDID) injection 0.5 mg (0.5 mg Intravenous Given 12/24/18 2108)     Initial Impression / Assessment and Plan / ED Course  I have reviewed the triage vital signs and the nursing notes.  Pertinent labs & imaging results that were available during my care of the patient were reviewed by me and considered in my medical decision making (see chart for details).  Clinical Course as of Dec 24 2122  Tue Dec 24, 2018  2058 Per Dr. Stann Mainland with ortho, NPO at midnight, probably surgery tomorrow.   [JR]  2114 Dr. Alcario Drought with [facility] hospital[facility] accepting admission.   [JR]    Clinical Course User Index [JR] Robinson, [place] N, PA-C    Patient with acute comminuted intertrochanteric fracture of the left hip after mechanical fall prior to arrival.  No head trauma.  Not on anticoagulation.  No other injuries other than small superficial abrasion to the right elbow.  Neurovascularly  intact.  Pain treated in the ED.  Patient discussed with Dr. Stann Mainland with orthopedics, recommends n.p.o. at midnight with likely surgery in the morning.  Dr. Alcario Drought with [facility] hospitalist accepting admission.  Patient discussed with and seen by Dr. Roderic Palau.  Discussed results, findings, treatment and follow up. Patient advised of return precautions. Patient verbalized understanding and agreed with plan.  Final Clinical Impressions(s) / ED Diagnoses   Final diagnoses:  Closed intertrochanteric fracture of left hip, initial encounter [facility])    ED Discharge Orders    None       Robinson, [place] N, PA-C 12/24/18 2124    Milton Ferguson, MD 12/24/18 2234

## 2018-12-24 NOTE — H&P (Signed)
History and Physical    Margaret Dyer QMG:[id number] DOB: December 18, 1934 DOA: 12/24/2018  PCP: Ann Held, DO  Patient coming from: Home  I have personally briefly reviewed patient's old medical records in [place]  Chief Complaint: Fall, hip pain  HPI: Margaret Dyer is a 84 y.o. female with medical history significant of Chronic diastolic CHF, CKD stage 3, DM2, HTN, NASH.  Patient presents to ED after a mechanical fall on concrete side walk.  Severe L hip pain, LLE deformity, inability to ambulate post fall.  Movement makes it worse, nothing makes it better.  No head, neck, back pain.  No blood thinners.  No LOC nor dizziness preceding fall.   ED Course: L hip Fx on X ray.   Review of Systems: As per HPI otherwise 10 point review of systems negative.   Past Medical History:  Diagnosis Date  . Asthma   . Blood transfusion   . Chronic diastolic CHF (congestive heart failure) ([place])   . CKD (chronic kidney disease), stage III ([place])   . Coronary artery calcification seen on CT scan   . Diabetes mellitus   . Goiter   . Hyperlipidemia   . Hypertension   . Hypothyroidism   . Liver cirrhosis ([place])   . NASH (nonalcoholic steatohepatitis)   . Obesity   . Osteopenia   . Pulmonary hypertension ([place])   . Renal cell carcinoma    a. prior nephrectomy.  . Right heart failure ([place])   . Thrombocytopenia ([place])     Past Surgical History:  Procedure Laterality Date  . ABDOMINAL HYSTERECTOMY    . APPENDECTOMY    . HEMORRHOID SURGERY    . KNEE SURGERY    . NEPHRECTOMY  09.2000     reports that she has quit smoking. She has never used smokeless tobacco. She reports that she does not drink alcohol or use drugs.  Allergies  Allergen Reactions  . Cefuroxime Axetil     Upset stomach   . Ciprofloxacin     Upset stomach    Family History  Problem Relation Age of Onset  . Cancer Sister        bladder  . Kidney cancer Father   . Cancer Father        renal  .  COPD Father   . Congestive Heart Failure Father   . Coronary artery disease Other   . Diabetes Other   . Hyperlipidemia Other   . Hypertension Other   . Rheumatologic disease Neg Hx      Prior to Admission medications   Medication Sig Start Date End Date Taking? Authorizing Provider  amLODipine (NORVASC) 10 MG tablet TAKE 1 TABLET BY MOUTH EVERY MORNING 09/30/18   Hilty, Nadean Corwin, MD  aspirin EC 81 MG tablet Take 1 tablet (81 mg total) by mouth daily. 06/05/17   Dunn, Nedra Hai, PA-C  furosemide (LASIX) 40 MG tablet TAKE 1 TABLET BY MOUTH TWICE A DAY 09/13/18   Hilty, Nadean Corwin, MD  hydrALAZINE (APRESOLINE) 50 MG tablet TAKE 1/2 TABLET BY MOUTH EVERY 8 HOURS 12/18/18   Hilty, Nadean Corwin, MD  insulin glargine (LANTUS) 100 UNIT/ML injection Inject 50 Units into the skin every evening.     [provider]  insulin glulisine (APIDRA) 100 UNIT/ML injection Inject 0.15 mLs (15 Units total) into the skin 3 (three) times daily with meals. 12/05/17   Domenic Polite, MD  levothyroxine (SYNTHROID, LEVOTHROID) 25 MCG tablet take 1 tablet by  mouth daily 07/13/17   Carollee Herter, Kendrick Fries R, DO  metoprolol tartrate (LOPRESSOR) 25 MG tablet TAKE 1 TABLET BY MOUTH TWICE DAILY 07/12/18   Carollee Herter, Kendrick Fries R, DO  oxybutynin (DITROPAN) 5 MG tablet TAKE 1 TABLET BY MOUTH EVERY MORNING 11/25/18   Carollee Herter, Alferd Apa, DO  potassium chloride SA (K-DUR,KLOR-CON) 20 MEQ tablet Take 20 mEq by mouth every evening.    [provider]  traMADol (ULTRAM) 50 MG tablet TAKE 1 TABLET BY MOUTH EVERY 6 HOURS AS NEEDED FOR MODERATE PAIN 10/23/18   Ann Held, DO    Physical Exam: Vitals:   12/24/18 1930 12/24/18 2000 12/24/18 2056 12/24/18 2105  BP: 133/80 (!) 143/80  (!) 158/61  Pulse:    79  Resp:    16  Temp:   98 F (36.7 C)   TempSrc:   Oral   SpO2:    94%  Weight:      Height:        Constitutional: NAD, calm, comfortable Eyes: PERRL, lids and conjunctivae normal ENMT: Mucous membranes  are moist. Posterior pharynx clear of any exudate or lesions.Normal dentition.  Neck: normal, supple, no masses, no thyromegaly Respiratory: clear to auscultation bilaterally, no wheezing, no crackles. Normal respiratory effort. No accessory muscle use.  Cardiovascular: Regular rate and rhythm, no murmurs / rubs / gallops. No extremity edema. 2+ pedal pulses. No carotid bruits.  Abdomen: no tenderness, no masses palpated. No hepatosplenomegaly. Bowel sounds positive.  Musculoskeletal: LLE shortened and rotated Skin: no rashes, lesions, ulcers. No induration Neurologic: CN 2-12 grossly intact. Sensation intact, DTR normal. Strength 5/5 in all 4.  Psychiatric: Normal judgment and insight. Alert and oriented x 3. Normal mood.    Labs on Admission: I have personally reviewed following labs and imaging studies  CBC: Recent Labs  Lab 12/24/18 2057  WBC 12.8*  NEUTROABS 11.0*  HGB 14.7  HCT 44.6  MCV 88.8  PLT 867*   Basic Metabolic Panel: Recent Labs  Lab 12/24/18 2057  NA 139  K 4.1  CL 104  CO2 24  GLUCOSE 199*  BUN 30*  CREATININE 1.15*  CALCIUM 9.3   GFR: Estimated Creatinine Clearance: 40.3 mL/min (A) (by C-G formula based on SCr of 1.15 mg/dL (H)). Liver Function Tests: No results for input(s): AST, ALT, ALKPHOS, BILITOT, PROT, ALBUMIN in the last 168 hours. No results for input(s): LIPASE, AMYLASE in the last 168 hours. No results for input(s): AMMONIA in the last 168 hours. Coagulation Profile: No results for input(s): INR, PROTIME in the last 168 hours. Cardiac Enzymes: No results for input(s): CKTOTAL, CKMB, CKMBINDEX, TROPONINI in the last 168 hours. BNP (last 3 results) Recent Labs    05/03/18 1416  PROBNP 399   HbA1C: No results for input(s): HGBA1C in the last 72 hours. CBG: Recent Labs  Lab 12/24/18 2029 12/24/18 2136  GLUCAP 194* 201*   Lipid Profile: No results for input(s): CHOL, HDL, LDLCALC, TRIG, CHOLHDL, LDLDIRECT in the last 72  hours. Thyroid Function Tests: No results for input(s): TSH, T4TOTAL, FREET4, T3FREE, THYROIDAB in the last 72 hours. Anemia Panel: No results for input(s): VITAMINB12, FOLATE, FERRITIN, TIBC, IRON, RETICCTPCT in the last 72 hours. Urine analysis:    Component Value Date/Time   COLORURINE YELLOW 05/17/2017 1212   APPEARANCEUR CLEAR 05/17/2017 1212   LABSPEC 1.010 05/17/2017 1212   PHURINE 5.0 05/17/2017 1212   GLUCOSEU NEGATIVE 05/17/2017 1212   HGBUR NEGATIVE 05/17/2017 1212   HGBUR large 02/17/2011  [place] neg 12/17/2017 [place] 05/17/2017 1212   PROTEINUR trace 12/17/2017 1310   PROTEINUR 100 (A) 05/17/2017 1212   UROBILINOGEN 0.2 12/17/2017 1310   UROBILINOGEN 0.2 02/17/2011 1432   NITRITE neg 12/17/2017 1310   NITRITE NEGATIVE 05/17/2017 1212   LEUKOCYTESUR Negative 12/17/2017 1310    Radiological Exams on Admission: Dg Chest 1 View  Result Date: 12/24/2018 CLINICAL DATA:  Status post fall. EXAM: CHEST  1 VIEW COMPARISON:  12/03/2017 FINDINGS: Enlarged cardiac silhouette. Calcific atherosclerotic disease of the aorta. There is no evidence of focal airspace consolidation, pleural effusion or pneumothorax. Stable left lung scarring. Osseous structures are without acute abnormality. Soft tissues are grossly normal. IMPRESSION: Enlarged cardiac silhouette. Calcific atherosclerotic disease of the aorta. Electronically Signed   By: Fidela Salisbury M.D.   On: 12/24/2018 20:31   Dg Hip Unilat With Pelvis 2-3 Views Left  Result Date: 12/24/2018 CLINICAL DATA:  Fall.  Hip pain EXAM: DG HIP (WITH OR WITHOUT PELVIS) 2-3V LEFT COMPARISON:  None. FINDINGS: There is an acute and comminuted fracture deformity involving the intertrochanteric portion of the proximal left femur. Medial angulation of the distal fracture fragments identified. IMPRESSION: 1. Acute and comminuted fracture deformity involves the intertrochanteric portions of the proximal left femur.  Electronically Signed   By: Kerby Moors M.D.   On: 12/24/2018 20:31    EKG: Independently reviewed.  Assessment/Plan Principal Problem:   Closed left hip fracture (HCC) Active Problems:   Hypertension   Diabetes mellitus type 2, insulin dependent (HCC)   Chronic diastolic CHF (congestive heart failure) (HCC)   CKD (chronic kidney disease), stage III (HCC)    1. L hip Fx - 1. Hip fx pathway 2. NPO after MN 3. Dilaudid IV PRN for pain 4. Looks like Dr. Stann Mainland to take pt to OR tomorrow 2. HTN - 1. Continue home BP meds 3. DM2 - 1. Lantus 10 QHS 2. Sensitive SSI Q4H 4. CKD stage 3 - 1. Creat 1.2, appears to be baseline based on labs from Dec 5. Chronic diastolic CHF - 1. Holding lasix / potassium in setting of keeping pt NPO  DVT prophylaxis: SCDs Code Status: Full Family Communication: Daughter at bedside Disposition Plan: SNF vs CIR after admit Consults called: Dr. Stann Mainland Admission status: Admit to inpatient  Severity of Illness: The appropriate patient status for this patient is INPATIENT. Inpatient status is judged to be reasonable and necessary in order to provide the required intensity of service to ensure the patient's safety. The patient's presenting symptoms, physical exam findings, and initial radiographic and laboratory data in the context of their chronic comorbidities is felt to place them at high risk for further clinical deterioration. Furthermore, it is not anticipated that the patient will be medically stable for discharge from the hospital within 2 midnights of admission. The following factors support the patient status of inpatient.   " The patient's presenting symptoms include Hip pain. " The worrisome physical exam findings include LLE deformity. " The initial radiographic and laboratory data are worrisome because of Hip fx. " The chronic co-morbidities include CHF, DM2, HTN.   * I certify that at the point of admission it is my clinical judgment that  the patient will require inpatient hospital care spanning beyond 2 midnights from the point of admission due to high intensity of service, high risk for further deterioration and high frequency of surveillance required.Etta Quill DO [facility] Pager [phone] Only  works nights!  If 7AM-7PM, please contact the primary day team physician taking care of patient  [email or web address] Password [facility]  12/24/2018, 9:46 PM

## 2018-12-25 ENCOUNTER — Inpatient Hospital Stay (HOSPITAL_COMMUNITY): Payer: Medicare Other | Admitting: Certified Registered Nurse Anesthetist

## 2018-12-25 ENCOUNTER — Encounter (HOSPITAL_COMMUNITY)
Admission: EM | Disposition: A | Discharge status: Skilled Nursing Facility | Payer: Self-pay | Source: Home / Self Care | Attending: Internal Medicine

## 2018-12-25 ENCOUNTER — Encounter (HOSPITAL_COMMUNITY): Payer: Self-pay | Admitting: Anesthesiology

## 2018-12-25 ENCOUNTER — Inpatient Hospital Stay (HOSPITAL_COMMUNITY): Payer: Medicare Other

## 2018-12-25 HISTORY — PX: INTRAMEDULLARY (IM) NAIL INTERTROCHANTERIC: SHX5875

## 2018-12-25 LAB — CBG MONITORING, ED
Glucose-Capillary: 171 mg/dL — ABNORMAL HIGH (ref 70–99)
Glucose-Capillary: 173 mg/dL — ABNORMAL HIGH (ref 70–99)

## 2018-12-25 LAB — GLUCOSE, CAPILLARY
GLUCOSE-CAPILLARY: 309 mg/dL — AB (ref 70–99)
Glucose-Capillary: 200 mg/dL — ABNORMAL HIGH (ref 70–99)
Glucose-Capillary: 214 mg/dL — ABNORMAL HIGH (ref 70–99)

## 2018-12-25 SURGERY — FIXATION, FRACTURE, INTERTROCHANTERIC, WITH INTRAMEDULLARY ROD
Anesthesia: Spinal | Laterality: Left

## 2018-12-25 MED ORDER — ONDANSETRON HCL 4 MG/2ML IJ SOLN
INTRAMUSCULAR | Status: DC | PRN
  Administered 2018-12-25: 4 mg via INTRAVENOUS

## 2018-12-25 MED ORDER — BUPIVACAINE IN DEXTROSE 0.75-8.25 % IT SOLN
INTRATHECAL | Status: DC | PRN
  Administered 2018-12-25: 1.6 mL via INTRATHECAL

## 2018-12-25 MED ORDER — POVIDONE-IODINE 10 % EX SWAB: 2.0000 "application " | Freq: Once | CUTANEOUS | Status: DC

## 2018-12-25 MED ORDER — PROPOFOL 10 MG/ML IV BOLUS
INTRAVENOUS | Status: DC | PRN
  Administered 2018-12-25: 20 ug via INTRAVENOUS

## 2018-12-25 MED ORDER — CEFAZOLIN SODIUM-DEXTROSE 2-4 GM/100ML-% IV SOLN
2.0000 g | INTRAVENOUS | Status: AC
  Administered 2018-12-25: 2 g via INTRAVENOUS
  Filled 2018-12-25: qty 100

## 2018-12-25 MED ORDER — PROPOFOL 1000 MG/100ML IV EMUL
INTRAVENOUS | Status: AC
  Filled 2018-12-25: qty 100

## 2018-12-25 MED ORDER — DOCUSATE SODIUM 100 MG PO CAPS
100.0000 mg | ORAL_CAPSULE | Freq: Two times a day (BID) | ORAL | Status: DC
  Administered 2018-12-25 – 2018-12-30 (×12): 100 mg via ORAL
  Filled 2018-12-25 (×12): qty 1

## 2018-12-25 MED ORDER — MORPHINE SULFATE (PF) 2 MG/ML IV SOLN: 0.5000 mg | INTRAVENOUS | Status: DC | PRN

## 2018-12-25 MED ORDER — FENTANYL CITRATE (PF) 250 MCG/5ML IJ SOLN
INTRAMUSCULAR | Status: DC | PRN
  Administered 2018-12-25 (×3): 50 ug via INTRAVENOUS

## 2018-12-25 MED ORDER — HYDROCODONE-ACETAMINOPHEN 5-325 MG PO TABS
1.0000 | ORAL_TABLET | ORAL | Status: DC | PRN
  Administered 2018-12-25 – 2018-12-26 (×2): 1 via ORAL
  Filled 2018-12-25 (×2): qty 1

## 2018-12-25 MED ORDER — PHENYLEPHRINE 40 MCG/ML (10ML) SYRINGE FOR IV PUSH (FOR BLOOD PRESSURE SUPPORT)
PREFILLED_SYRINGE | INTRAVENOUS | Status: DC | PRN
  Administered 2018-12-25 (×2): 80 ug via INTRAVENOUS

## 2018-12-25 MED ORDER — FENTANYL CITRATE (PF) 250 MCG/5ML IJ SOLN
INTRAMUSCULAR | Status: AC
  Filled 2018-12-25: qty 5

## 2018-12-25 MED ORDER — ACETAMINOPHEN 500 MG PO TABS
500.0000 mg | ORAL_TABLET | Freq: Four times a day (QID) | ORAL | Status: AC
  Administered 2018-12-25 – 2018-12-26 (×4): 500 mg via ORAL
  Filled 2018-12-25 (×4): qty 1

## 2018-12-25 MED ORDER — 0.9 % SODIUM CHLORIDE (POUR BTL) OPTIME
TOPICAL | Status: DC | PRN
  Administered 2018-12-25: 1000 mL

## 2018-12-25 MED ORDER — TRAMADOL HCL 50 MG PO TABS
50.0000 mg | ORAL_TABLET | Freq: Four times a day (QID) | ORAL | Status: DC | PRN
  Administered 2018-12-25: 50 mg via ORAL
  Filled 2018-12-25: qty 1

## 2018-12-25 MED ORDER — MORPHINE SULFATE (PF) 4 MG/ML IV SOLN
4.0000 mg | Freq: Once | INTRAVENOUS | Status: AC
  Administered 2018-12-25: 4 mg via INTRAVENOUS
  Filled 2018-12-25: qty 1

## 2018-12-25 MED ORDER — EPHEDRINE SULFATE-NACL 50-0.9 MG/10ML-% IV SOSY
PREFILLED_SYRINGE | INTRAVENOUS | Status: DC | PRN
  Administered 2018-12-25: 10 mg via INTRAVENOUS

## 2018-12-25 MED ORDER — INSULIN ASPART 100 UNIT/ML ~~LOC~~ SOLN
0.0000 [IU] | Freq: Three times a day (TID) | SUBCUTANEOUS | Status: DC
  Administered 2018-12-25: 3 [IU] via SUBCUTANEOUS
  Administered 2018-12-26: 5 [IU] via SUBCUTANEOUS
  Administered 2018-12-26: 2 [IU] via SUBCUTANEOUS
  Administered 2018-12-26 – 2018-12-27 (×4): 3 [IU] via SUBCUTANEOUS
  Administered 2018-12-28: 5 [IU] via SUBCUTANEOUS
  Administered 2018-12-28 (×2): 3 [IU] via SUBCUTANEOUS
  Administered 2018-12-29 (×2): 5 [IU] via SUBCUTANEOUS
  Administered 2018-12-29 – 2018-12-30 (×4): 3 [IU] via SUBCUTANEOUS

## 2018-12-25 MED ORDER — ACETAMINOPHEN 325 MG PO TABS
325.0000 mg | ORAL_TABLET | Freq: Four times a day (QID) | ORAL | Status: DC | PRN
  Administered 2018-12-28 – 2018-12-30 (×6): 650 mg via ORAL
  Filled 2018-12-25 (×5): qty 2

## 2018-12-25 MED ORDER — FENTANYL CITRATE (PF) 100 MCG/2ML IJ SOLN: 25.0000 ug | INTRAMUSCULAR | Status: DC | PRN

## 2018-12-25 MED ORDER — PROPOFOL 10 MG/ML IV BOLUS
INTRAVENOUS | Status: AC
  Filled 2018-12-25: qty 20

## 2018-12-25 MED ORDER — LACTATED RINGERS IV SOLN
INTRAVENOUS | Status: DC
  Administered 2018-12-25: 11:00:00 via INTRAVENOUS

## 2018-12-25 MED ORDER — HYDROCODONE-ACETAMINOPHEN 7.5-325 MG PO TABS
1.0000 | ORAL_TABLET | ORAL | Status: DC | PRN
  Administered 2018-12-27: 1 via ORAL
  Filled 2018-12-25: qty 1

## 2018-12-25 MED ORDER — PROPOFOL 500 MG/50ML IV EMUL
INTRAVENOUS | Status: DC | PRN
  Administered 2018-12-25: 25 ug/kg/min via INTRAVENOUS

## 2018-12-25 MED ORDER — MEPERIDINE HCL 50 MG/ML IJ SOLN: 6.2500 mg | INTRAMUSCULAR | Status: DC | PRN

## 2018-12-25 MED ORDER — ONDANSETRON HCL 4 MG/2ML IJ SOLN
4.0000 mg | Freq: Four times a day (QID) | INTRAMUSCULAR | Status: DC | PRN
  Administered 2018-12-25: 4 mg via INTRAVENOUS
  Filled 2018-12-25: qty 2

## 2018-12-25 MED ORDER — GABAPENTIN 300 MG PO CAPS
300.0000 mg | ORAL_CAPSULE | Freq: Three times a day (TID) | ORAL | Status: DC
  Administered 2018-12-25 – 2018-12-28 (×9): 300 mg via ORAL
  Filled 2018-12-25 (×10): qty 1

## 2018-12-25 MED ORDER — ASPIRIN 325 MG PO TABS
325.0000 mg | ORAL_TABLET | Freq: Every day | ORAL | Status: DC
  Administered 2018-12-26 – 2018-12-30 (×5): 325 mg via ORAL
  Filled 2018-12-25 (×5): qty 1

## 2018-12-25 MED ORDER — ONDANSETRON HCL 4 MG/2ML IJ SOLN: 4.0000 mg | Freq: Once | INTRAMUSCULAR | Status: DC | PRN

## 2018-12-25 MED ORDER — METOCLOPRAMIDE HCL 5 MG PO TABS: 5.0000 mg | ORAL_TABLET | Freq: Three times a day (TID) | ORAL | Status: DC | PRN

## 2018-12-25 MED ORDER — METOCLOPRAMIDE HCL 5 MG/ML IJ SOLN: 5.0000 mg | Freq: Three times a day (TID) | INTRAMUSCULAR | Status: DC | PRN

## 2018-12-25 MED ORDER — ONDANSETRON HCL 4 MG PO TABS: 4.0000 mg | ORAL_TABLET | Freq: Four times a day (QID) | ORAL | Status: DC | PRN

## 2018-12-25 MED ORDER — CEFAZOLIN SODIUM-DEXTROSE 1-4 GM/50ML-% IV SOLN
1.0000 g | Freq: Four times a day (QID) | INTRAVENOUS | Status: AC
  Administered 2018-12-25 – 2018-12-26 (×3): 1 g via INTRAVENOUS
  Filled 2018-12-25 (×3): qty 50

## 2018-12-25 MED ORDER — CHLORHEXIDINE GLUCONATE 4 % EX LIQD
60.0000 mL | Freq: Once | CUTANEOUS | Status: AC
  Administered 2018-12-25: 4 via TOPICAL
  Filled 2018-12-25: qty 60

## 2018-12-25 SURGICAL SUPPLY — 37 items
ALCOHOL 70% 16 OZ (MISCELLANEOUS) ×2 IMPLANT
BIT DRILL FLUTED FEMUR 4.2/3 (BIT) ×2 IMPLANT
BNDG COHESIVE 6X5 TAN STRL LF (GAUZE/BANDAGES/DRESSINGS) ×4 IMPLANT
CANISTER SUCTION WELLS/JOHNSON (MISCELLANEOUS) ×2 IMPLANT
COVER PERINEAL POST (MISCELLANEOUS) ×2 IMPLANT
COVER SURGICAL LIGHT HANDLE (MISCELLANEOUS) ×2 IMPLANT
COVER WAND RF STERILE (DRAPES) ×2 IMPLANT
DRAPE HALF SHEET 40X57 (DRAPES) IMPLANT
DRAPE INCISE IOBAN 66X45 STRL (DRAPES) ×2 IMPLANT
DRAPE STERI IOBAN 125X83 (DRAPES) ×2 IMPLANT
DRSG ADAPTIC 3X8 NADH LF (GAUZE/BANDAGES/DRESSINGS) ×2 IMPLANT
DURAPREP 26ML APPLICATOR (WOUND CARE) ×2 IMPLANT
ELECT CAUTERY BLADE 6.4 (BLADE) ×2 IMPLANT
ELECT REM PT RETURN 9FT ADLT (ELECTROSURGICAL) ×2
ELECTRODE REM PT RTRN 9FT ADLT (ELECTROSURGICAL) ×1 IMPLANT
GAUZE SPONGE 4X4 12PLY STRL LF (GAUZE/BANDAGES/DRESSINGS) ×2 IMPLANT
GLOVE BIO SURGEON STRL SZ7.5 (GLOVE) ×2 IMPLANT
GLOVE BIOGEL PI IND STRL 8 (GLOVE) ×1 IMPLANT
GLOVE BIOGEL PI INDICATOR 8 (GLOVE) ×1
GOWN STRL REUS W/ TWL LRG LVL3 (GOWN DISPOSABLE) ×1 IMPLANT
GOWN STRL REUS W/ TWL XL LVL3 (GOWN DISPOSABLE) ×1 IMPLANT
GOWN STRL REUS W/TWL LRG LVL3 (GOWN DISPOSABLE) ×1
GOWN STRL REUS W/TWL XL LVL3 (GOWN DISPOSABLE) ×1
GUIDEWIRE 3.2X400 (WIRE) ×4 IMPLANT
KIT BASIN OR (CUSTOM PROCEDURE TRAY) ×2 IMPLANT
KIT TURNOVER KIT B (KITS) ×2 IMPLANT
NAIL TROCH FIX 10X235 LT 130 (Nail) ×2 IMPLANT
NS IRRIG 1000ML POUR BTL (IV SOLUTION) ×2 IMPLANT
PACK GENERAL/GYN (CUSTOM PROCEDURE TRAY) ×2 IMPLANT
PAD ARMBOARD 7.5X6 YLW CONV (MISCELLANEOUS) ×4 IMPLANT
SCREW LOCK STAR 5X32 (Screw) ×2 IMPLANT
SCREW TFNA 90 (Screw) ×2 IMPLANT
STAPLER VISISTAT 35W (STAPLE) ×2 IMPLANT
SUT MON AB 2-0 CT1 36 (SUTURE) ×2 IMPLANT
TOWEL OR 17X24 6PK STRL BLUE (TOWEL DISPOSABLE) ×2 IMPLANT
TOWEL OR 17X26 10 PK STRL BLUE (TOWEL DISPOSABLE) ×2 IMPLANT
WATER STERILE IRR 1000ML POUR (IV SOLUTION) ×2 IMPLANT

## 2018-12-25 NOTE — ED Notes (Signed)
Consent for OR signed, Hibiclens bath done.

## 2018-12-25 NOTE — ED Notes (Signed)
Patient is NPO at this time.

## 2018-12-25 NOTE — Progress Notes (Signed)
PROGRESS NOTE    Margaret Dyer  ION:[id number] DOB: 09-Aug-1935 DOA: 12/24/2018 PCP: Ann Held, DO     Brief Narrative:  Margaret Dyer is a 84 year old female with past medical history significant for chronic diastolic heart failure, chronic kidney disease stage III, type 2 diabetes, hypertension, NASH.  She presented to the emergency room after mechanical fall on a concrete sidewalk outside her home.  Found to have left hip fracture.  New events last 24 hours / Subjective: Continues to have pain in the left leg.  Denies any chest pain or shortness of breath.  Assessment & Plan:   Principal Problem:   Closed left hip fracture (HCC) Active Problems:   Hypertension   Diabetes mellitus type 2, insulin dependent (HCC)   Chronic diastolic CHF (congestive heart failure) (HCC)   CKD (chronic kidney disease), stage III (HCC)   Left hip fracture Orthopedic surgery consulted, planning for OR later today  Essential hypertension Continue Norvasc, metoprolol  Type 2 diabetes Continue Lantus, sliding scale insulin  Chronic kidney disease stage III Baseline creatinine around 1.2 Stable  Hypothyroidism Continue Synthroid  Chronic diastolic heart failure Appears stable   DVT prophylaxis: Per orthopedic surgery Code Status: Full code Family Communication: No family at bedside Disposition Plan: Pending postsurgical progress, anticipate skilled nursing facility placement   Consultants:   Orthopedic surgery  Procedures:   None  Antimicrobials:  Anti-infectives (From admission, onward)   Start     Dose/Rate Route Frequency Ordered Stop   12/25/18 1200  ceFAZolin (ANCEF) IVPB 2g/100 mL premix     2 g 200 mL/hr over 30 Minutes Intravenous To ShortStay Surgical 12/25/18 0736 12/26/18 1200        Objective: Vitals:   12/25/18 0015 12/25/18 0030 12/25/18 0045 12/25/18 0622  BP: (!) 139/55 (!) 130/55 (!) 128/58 (!) 152/61  Pulse: 61 72 63 62  Resp: 17 (!) _0 Temp:    98.8 F (37.1 C)  TempSrc:    Oral  SpO2: 93% 97% 96% 96%  Weight:      Height:       No intake or output data in the 24 hours ending 12/25/18 1114 Filed Weights   12/24/18 1905  Weight: 86.2 kg    Examination:  General exam: Appears calm and comfortable  Respiratory system: Clear to auscultation. Respiratory effort normal. Cardiovascular system: S1 & S2 heard, RRR. No JVD, murmurs, rubs, gallops or clicks. No pedal edema. Gastrointestinal system: Abdomen is nondistended, soft and nontender. No organomegaly or masses felt. Normal bowel sounds heard. Central nervous system: Alert and oriented. No focal neurological deficits. Extremities: Symmetric 5 x 5 power. Skin: No rashes, lesions or ulcers Psychiatry: Judgement and insight appear normal. Mood & affect appropriate.   Data Reviewed: I have personally reviewed following labs and imaging studies  CBC: Recent Labs  Lab 12/24/18 2057  WBC 12.8*  NEUTROABS 11.0*  HGB 14.7  HCT 44.6  MCV 88.8  PLT 244*   Basic Metabolic Panel: Recent Labs  Lab 12/24/18 2057  NA 139  K 4.1  CL 104  CO2 24  GLUCOSE 199*  BUN 30*  CREATININE 1.15*  CALCIUM 9.3   GFR: Estimated Creatinine Clearance: 40.3 mL/min (A) (by C-G formula based on SCr of 1.15 mg/dL (H)). Liver Function Tests: No results for input(s): AST, ALT, ALKPHOS, BILITOT, PROT, ALBUMIN in the last 168 hours. No results for input(s): LIPASE, AMYLASE in the last 168 hours. No results for input(s):  AMMONIA in the last 168 hours. Coagulation Profile: No results for input(s): INR, PROTIME in the last 168 hours. Cardiac Enzymes: No results for input(s): CKTOTAL, CKMB, CKMBINDEX, TROPONINI in the last 168 hours. BNP (last 3 results) Recent Labs    05/03/18 1416  PROBNP 399   HbA1C: No results for input(s): HGBA1C in the last 72 hours. CBG: Recent Labs  Lab 12/24/18 2029 12/24/18 2136 12/25/18 0008 12/25/18 1107  GLUCAP 194* 201* 173* 171*    Lipid Profile: No results for input(s): CHOL, HDL, LDLCALC, TRIG, CHOLHDL, LDLDIRECT in the last 72 hours. Thyroid Function Tests: No results for input(s): TSH, T4TOTAL, FREET4, T3FREE, THYROIDAB in the last 72 hours. Anemia Panel: No results for input(s): VITAMINB12, FOLATE, FERRITIN, TIBC, IRON, RETICCTPCT in the last 72 hours. Sepsis Labs: No results for input(s): PROCALCITON, LATICACIDVEN in the last 168 hours.  No results found for this or any previous visit (from the past 240 hour(s)).     Radiology Studies: Dg Chest 1 View  Result Date: 12/24/2018 CLINICAL DATA:  Status post fall. EXAM: CHEST  1 VIEW COMPARISON:  12/03/2017 FINDINGS: Enlarged cardiac silhouette. Calcific atherosclerotic disease of the aorta. There is no evidence of focal airspace consolidation, pleural effusion or pneumothorax. Stable left lung scarring. Osseous structures are without acute abnormality. Soft tissues are grossly normal. IMPRESSION: Enlarged cardiac silhouette. Calcific atherosclerotic disease of the aorta. Electronically Signed   By: Fidela Salisbury M.D.   On: 12/24/2018 20:31   Dg Knee 1-2 Views Left  Result Date: 12/25/2018 CLINICAL DATA:  84 year old female with a history of fall and pain EXAM: LEFT KNEE - 1-2 VIEW COMPARISON:  None. FINDINGS: No acute displaced fracture. Mild joint space narrowing of the mediolateral compartment. Evidence of chondrocalcinosis. Degenerative changes of the patellofemoral joint. No joint effusion. Osteopenia. Vascular calcifications. IMPRESSION: No acute bony abnormality. Tricompartmental osteoarthritis. Chondrocalcinosis Atherosclerosis Electronically Signed   By: Corrie Mckusick D.O.   On: 12/25/2018 10:39   Dg Hip Unilat With Pelvis 2-3 Views Left  Result Date: 12/24/2018 CLINICAL DATA:  Fall.  Hip pain EXAM: DG HIP (WITH OR WITHOUT PELVIS) 2-3V LEFT COMPARISON:  None. FINDINGS: There is an acute and comminuted fracture deformity involving the  intertrochanteric portion of the proximal left femur. Medial angulation of the distal fracture fragments identified. IMPRESSION: 1. Acute and comminuted fracture deformity involves the intertrochanteric portions of the proximal left femur. Electronically Signed   By: Kerby Moors M.D.   On: 12/24/2018 20:31      Scheduled Meds: . amLODipine  10 mg Oral q morning - 10a  . hydrALAZINE  25 mg Oral Q8H  . insulin aspart  0-9 Units Subcutaneous Q4H  . insulin glargine  10 Units Subcutaneous QHS  . levothyroxine  25 mcg Oral Daily  . metoprolol tartrate  25 mg Oral BID  . oxybutynin  5 mg Oral q morning - 10a  . povidone-iodine  2 application Topical Once   Continuous Infusions: .  ceFAZolin (ANCEF) IV       LOS: 1 day    Time spent: 35 minutes   Dessa Phi, DO [facility] [email or web address] 12/25/2018, 11:14 AM

## 2018-12-25 NOTE — Anesthesia Preprocedure Evaluation (Addendum)
Anesthesia Evaluation  Patient identified by MRN, date of birth, ID band Patient awake    Reviewed: Allergy & Precautions, NPO status , Patient's Chart, lab work & pertinent test results, reviewed documented beta blocker date and time   Airway Mallampati: II  TM Distance: >3 FB Neck ROM: Full    Dental  (+) Upper Dentures, Lower Dentures   Pulmonary asthma , pneumonia, resolved, former smoker,    Pulmonary exam normal breath sounds clear to auscultation       Cardiovascular hypertension, Pt. on medications and Pt. on home beta blockers + CAD, +CHF and + DOE  Normal cardiovascular exam Rhythm:Regular Rate:Normal  Moderate to severe Pulmonary HTN   Neuro/Psych PSYCHIATRIC DISORDERS Anxiety Depression  Neuromuscular disease    GI/Hepatic negative GI ROS, (+) Cirrhosis       , Hepatitis -Hx/o NASH and hypersplenism   Endo/Other  diabetes, Well Controlled, Type 2, Insulin DependentHypothyroidism Obesity  Renal/GU Renal InsufficiencyRenal diseaseHx/o renal cell Ca S/P nephrectomy Hx/o renal calculi  negative genitourinary   Musculoskeletal  (+) Arthritis , Osteoarthritis,  Fx left hip   Abdominal (+) + obese,   Peds  Hematology Hx/o thrombocytopenia   Anesthesia Other Findings   Reproductive/Obstetrics                             Anesthesia Physical Anesthesia Plan  ASA: III and emergent  Anesthesia Plan: Spinal   Post-op Pain Management:    Induction:   PONV Risk Score and Plan: 3 and Ondansetron, Dexamethasone, Treatment may vary due to age or medical condition and Propofol infusion  Airway Management Planned: Natural Airway and Simple Face Mask  Additional Equipment:   Intra-op Plan:   Post-operative Plan:   Informed Consent: I have reviewed the patients History and Physical, chart, labs and discussed the procedure including the risks, benefits and alternatives for the proposed  anesthesia with the patient or authorized representative who has indicated his/her understanding and acceptance.     Dental advisory given  Plan Discussed with: CRNA and Surgeon  Anesthesia Plan Comments:         Anesthesia Quick Evaluation

## 2018-12-25 NOTE — Consult Note (Addendum)
Reason for Consult:Left hip fx Referring Physician: Elda Dyer is an 84 y.o. female.  HPI: Margaret Dyer was coming out of her house when she tripped on the concrete porch and fell onto her left side. She had immediate hip pain and could not get up. She called EMS and her workup in the ED showed a left intertroch hip fx and orthopedic surgery was consulted. She c/o localized pain to the hip only.  Past Medical History:  Diagnosis Date  . Asthma   . Blood transfusion   . Chronic diastolic CHF (congestive heart failure) ([place])   . CKD (chronic kidney disease), stage III ([place])   . Coronary artery calcification seen on CT scan   . Diabetes mellitus   . Goiter   . Hyperlipidemia   . Hypertension   . Hypothyroidism   . Liver cirrhosis ([place])   . NASH (nonalcoholic steatohepatitis)   . Obesity   . Osteopenia   . Pulmonary hypertension ([place])   . Renal cell carcinoma    a. prior nephrectomy.  . Right heart failure ([place])   . Thrombocytopenia ([place])     Past Surgical History:  Procedure Laterality Date  . ABDOMINAL HYSTERECTOMY    . APPENDECTOMY    . HEMORRHOID SURGERY    . KNEE SURGERY    . NEPHRECTOMY  09.2000    Family History  Problem Relation Age of Onset  . Cancer Sister        bladder  . Kidney cancer Father   . Cancer Father        renal  . COPD Father   . Congestive Heart Failure Father   . Coronary artery disease Other   . Diabetes Other   . Hyperlipidemia Other   . Hypertension Other   . Rheumatologic disease Neg Hx     Social History:  reports that she has quit smoking. She has never used smokeless tobacco. She reports that she does not drink alcohol or use drugs.  Allergies:  Allergies  Allergen Reactions  . Other Other (See Comments)    Patient has hemorrhaged at least 4 times in her lifetime  . Cefuroxime Axetil Nausea Only    Upset stomach   . Ciprofloxacin Nausea Only    Upset stomach    Medications: I have reviewed the patient's current  medications.  Results for orders placed or performed during the hospital encounter of 12/24/18 (from the past 48 hour(s))  CBG monitoring, ED     Status: Abnormal   Collection Time: 12/24/18  8:29 PM  Result Value Ref Range   Glucose-Capillary 194 (H) 70 - 99 mg/dL   Comment 1 Notify RN   Basic metabolic panel     Status: Abnormal   Collection Time: 12/24/18  8:57 PM  Result Value Ref Range   Sodium 139 135 - 145 mmol/L   Potassium 4.1 3.5 - 5.1 mmol/L   Chloride 104 98 - 111 mmol/L   CO2 24 22 - 32 mmol/L   Glucose, Bld 199 (H) 70 - 99 mg/dL   BUN 30 (H) 8 - 23 mg/dL   Creatinine, Ser 1.15 (H) 0.44 - 1.00 mg/dL   Calcium 9.3 8.9 - 10.3 mg/dL   GFR calc non Af Amer 45 (L) >60 mL/min   GFR calc Af Amer 52 (L) >60 mL/min   Anion gap 11 5 - 15    Comment: Performed at [facility], [address], [place], [place] [address]  CBC  with Differential     Status: Abnormal   Collection Time: 12/24/18  8:57 PM  Result Value Ref Range   WBC 12.8 (H) 4.0 - 10.5 K/uL   RBC 5.02 3.87 - 5.11 MIL/uL   Hemoglobin 14.7 12.0 - 15.0 g/dL   HCT 44.6 36.0 - 46.0 %   MCV 88.8 80.0 - 100.0 fL   MCH 29.3 26.0 - 34.0 pg   MCHC 33.0 30.0 - 36.0 g/dL   RDW 13.2 11.5 - 15.5 %   Platelets 122 (L) 150 - 400 K/uL   nRBC 0.0 0.0 - 0.2 %   Neutrophils Relative % 86 %   Neutro Abs 11.0 (H) 1.7 - 7.7 K/uL   Lymphocytes Relative 6 %   Lymphs Abs 0.7 0.7 - 4.0 K/uL   Monocytes Relative 7 %   Monocytes Absolute 0.9 0.1 - 1.0 K/uL   Eosinophils Relative 0 %   Eosinophils Absolute 0.0 0.0 - 0.5 K/uL   Basophils Relative 0 %   Basophils Absolute 0.0 0.0 - 0.1 K/uL   Immature Granulocytes 1 %   Abs Immature Granulocytes 0.13 (H) 0.00 - 0.07 K/uL    Comment: Performed at [facility], [address] [place], [place] [address]  Type and screen [place]     Status: None   Collection Time: 12/24/18  9:00 PM  Result Value Ref Range   ABO/RH(D) A POS    Antibody Screen NEG     Sample Expiration      12/27/2018 Performed at [facility], [place] [address] [place], [place] [address]   ABO/Rh     Status: None   Collection Time: 12/24/18  9:00 PM  Result Value Ref Range   ABO/RH(D)      A POS Performed at [facility] [address] [place], [place] [address]   CBG monitoring, ED     Status: Abnormal   Collection Time: 12/24/18  9:36 PM  Result Value Ref Range   Glucose-Capillary 201 (H) 70 - 99 mg/dL  CBG monitoring, ED     Status: Abnormal   Collection Time: 12/25/18 12:08 AM  Result Value Ref Range   Glucose-Capillary 173 (H) 70 - 99 mg/dL    Dg Chest 1 View  Result Date: 12/24/2018 CLINICAL DATA:  Status post fall. EXAM: CHEST  1 VIEW COMPARISON:  12/03/2017 FINDINGS: Enlarged cardiac silhouette. Calcific atherosclerotic disease of the aorta. There is no evidence of focal airspace consolidation, pleural effusion or pneumothorax. Stable left lung scarring. Osseous structures are without acute abnormality. Soft tissues are grossly normal. IMPRESSION: Enlarged cardiac silhouette. Calcific atherosclerotic disease of the aorta. Electronically Signed   By: Fidela Salisbury M.D.   On: 12/24/2018 20:31   Dg Hip Unilat With Pelvis 2-3 Views Left  Result Date: 12/24/2018 CLINICAL DATA:  Fall.  Hip pain EXAM: DG HIP (WITH OR WITHOUT PELVIS) 2-3V LEFT COMPARISON:  None. FINDINGS: There is an acute and comminuted fracture deformity involving the intertrochanteric portion of the proximal left femur. Medial angulation of the distal fracture fragments identified. IMPRESSION: 1. Acute and comminuted fracture deformity involves the intertrochanteric portions of the proximal left femur. Electronically Signed   By: Kerby Moors M.D.   On: 12/24/2018 20:31    Review of Systems  Constitutional: Negative for weight loss.  HENT: Negative for ear discharge, ear pain, hearing loss and tinnitus.   Eyes: Negative for blurred vision, double vision, photophobia and  pain.  Respiratory:  Negative for cough, sputum production and shortness of breath.   Cardiovascular: Negative for chest pain.  Gastrointestinal: Negative for abdominal pain, nausea and vomiting.  Genitourinary: Negative for dysuria, flank pain, frequency and urgency.  Musculoskeletal: Positive for joint pain (Left hip). Negative for back pain, falls, myalgias and neck pain.  Neurological: Negative for dizziness, tingling, sensory change, focal weakness, loss of consciousness and headaches.  Endo/Heme/Allergies: Does not bruise/bleed easily.  Psychiatric/Behavioral: Negative for depression, memory loss and substance abuse. The patient is not nervous/anxious.    Blood pressure (!) 152/61, pulse 62, temperature 98.8 F (37.1 C), temperature source Oral, resp. rate 20, height 5' 3.5" (1.613 m), weight 86.2 kg, SpO2 96 %. Physical Exam  Constitutional: She appears well-developed and well-nourished. No distress.  HENT:  Head: Normocephalic and atraumatic.  Eyes: Conjunctivae are normal. Right eye exhibits no discharge. Left eye exhibits no discharge. No scleral icterus.  Neck: Normal range of motion.  Cardiovascular: Normal rate and regular rhythm.  Respiratory: Effort normal. No respiratory distress.  Musculoskeletal:     Comments: LLE No traumatic wounds, ecchymosis, or rash  TTP hip, TTP knee but less so, leg shortened and externally rotated  No knee or ankle effusion  Knee stable to varus/ valgus and anterior/posterior stress  Sens DPN, SPN, TN intact  Motor EHL, ext, flex, evers 5/5  DP 2+, PT 1+, No significant edema  Neurological: She is alert.  Skin: Skin is warm and dry. She is not diaphoretic.  Psychiatric: She has a normal mood and affect. Her behavior is normal.    Assessment/Plan: Left hip fx -- Plan for IMN later today by Dr. Stann Mainland. NPO until then. Will check knee films but doubt additional fx. Multiple medical problems including CHF, CKD, DM, HTN, and NASH -- per primary  service    Lisette Abu, PA-C Orthopedic Surgery [phone] 12/25/2018, 9:57 AM     I have examined the patient and I agree with the above findings and recommendation as outlined by Silvestre Gunner, PA-C.  Will plan for IMN of left hip today.  She will return to the medicine service post op for routine care and will be WBAT to the LLE.  [place].

## 2018-12-25 NOTE — ED Notes (Signed)
Patient transported to xray for knee xray

## 2018-12-25 NOTE — Brief Op Note (Signed)
12/25/2018  1:12 PM  PATIENT:  [name]  84 y.o. female  PRE-OPERATIVE DIAGNOSIS:  left hip fracture  POST-OPERATIVE DIAGNOSIS:  left hip fracture  PROCEDURE:  Procedure(s): INTRAMEDULLARY (IM) NAIL INTERTROCHANTRIC (Left)  SURGEON:  Surgeon(s) and Role:    * Nicholes Stairs, MD - Primary  PHYSICIAN ASSISTANT:   ASSISTANTS: none   ANESTHESIA:   spinal  EBL:  100 cc  BLOOD ADMINISTERED:none  DRAINS: none   LOCAL MEDICATIONS USED:  NONE  SPECIMEN:  No Specimen  DISPOSITION OF SPECIMEN:  N/A  COUNTS:  YES  TOURNIQUET:  * No tourniquets in log *  DICTATION: .Note written in EPIC  PLAN OF CARE: Admit to inpatient   PATIENT DISPOSITION:  PACU - hemodynamically stable.   Delay start of Pharmacological VTE agent (>24hrs) due to surgical blood loss or risk of bleeding: not applicable

## 2018-12-25 NOTE — Transfer of Care (Signed)
Immediate Anesthesia Transfer of Care Note  Patient: Margaret Dyer  Procedure(s) Performed: INTRAMEDULLARY (IM) NAIL INTERTROCHANTRIC (Left )  Patient Location: PACU  Anesthesia Type:MAC and Spinal  Level of Consciousness: awake and alert   Airway & Oxygen Therapy: Patient Spontanous Breathing and Patient connected to face mask oxygen  Post-op Assessment: Report given to RN and Post -op Vital signs reviewed and stable  Post vital signs: Reviewed and stable  Last Vitals:  Vitals Value Taken Time  BP 121/48 12/25/2018  1:12 PM  Temp    Pulse 75 12/25/2018  1:14 PM  Resp 15 12/25/2018  1:14 PM  SpO2 97 % 12/25/2018  1:14 PM  Vitals shown include unvalidated device data.  Last Pain:  Vitals:   12/25/18 0622  TempSrc: Oral  PainSc:          Complications: No apparent anesthesia complications

## 2018-12-25 NOTE — Op Note (Signed)
Date of Surgery: 12/25/2018  INDICATIONS: [name] is a 84 y.o.-year-old female who sustained a left hip fracture. The risks and benefits of the procedure discussed with the patient prior to the procedure and all questions were answered; consent was obtained.  PREOPERATIVE DIAGNOSIS: left hip fracture   POSTOPERATIVE DIAGNOSIS: Same   PROCEDURE: Treatment of intertrochanteric, pertrochanteric, subtrochanteric fracture with intramedullary implant. CPT [phone]   SURGEON: Dannielle Karvonen. Stann Mainland, M.D.   ANESTHESIA: general   IV FLUIDS AND URINE: See anesthesia record   ESTIMATED BLOOD LOSS: 100 cc  IMPLANTS:   Synthes TFN A 10 x 235 mm troch nail 90 mm lag screw 32 mm distal interlock  DRAINS: None.   COMPLICATIONS: None.   DESCRIPTION OF PROCEDURE: The patient was brought to the operating room and placed supine on the operating table. The patient's leg had been signed prior to the procedure. The patient had the anesthesia placed by the anesthesiologist. The prep verification and incision time-outs were performed to confirm that this was the correct patient, site, side and location. The patient had an SCD on the opposite lower extremity. The patient did receive antibiotics prior to the incision and was re-dosed during the procedure as needed at indicated intervals. The patient was positioned on the fracture table with the table in traction and internal rotation to reduce the hip. The well leg was placed in a scissor position and all bony prominences were well-padded. The patient had the lower extremity prepped and draped in the standard surgical fashion. The incision was made 4 finger breadths superior to the greater trochanter. A guide pin was inserted into the tip of the greater trochanter under fluoroscopic guidance. An opening reamer was used to gain access to the femoral canal. The nail length was measured and inserted down the femoral canal to its proper depth. The appropriate version of  insertion for the lag screw was found under fluoroscopy. A pin was inserted up the femoral neck through the jig. The length of the lag screw was then measured. The lag screw was inserted as near to center-center in the head as possible. The leg was taken out of traction, then the compression screw was used to compress across the fracture. Compression was visualized on serial xrays.   We next turned our attention to the distal interlocking screw.  This was placed through the drill guide of the nail inserter.  A small incision was made overlying the lateral thigh at the screw site, and a tonsil was used to disect down to bone.  A drill pass was made through the jig and across the nail through both cortices.  This was measured, and the appropriate screw was placed under hand power and found to have good bite.    The wound was copiously irrigated with saline and the subcutaneous layer closed with 2.0 vicryl and the skin was reapproximated with staples. The wounds were cleaned and dried a final time and a sterile dressing was placed. The hip was taken through a range of motion at the end of the case under fluoroscopic imaging to visualize the approach-withdraw phenomenon and confirm implant length in the head. The patient was then awakened from anesthesia and taken to the recovery room in stable condition. All counts were correct at the end of the case.   POSTOPERATIVE PLAN: The patient will be weight bearing as tolerated and will return in 2 weeks for staple removal and the patient will receive DVT prophylaxis based on other medications, activity  level, and risk ratio of bleeding to thrombosis.     Geralynn Rile, MD [facility] [phone] 1:13 PM

## 2018-12-25 NOTE — Anesthesia Procedure Notes (Signed)
Spinal  Patient location during procedure: OR Start time: 12/25/2018 12:13 PM End time: 12/25/2018 12:17 PM Staffing Anesthesiologist: Josephine Igo, MD Performed: anesthesiologist  Preanesthetic Checklist Completed: patient identified, site marked, surgical consent, pre-op evaluation, timeout performed, IV checked, risks and benefits discussed and monitors and equipment checked Spinal Block Patient position: left lateral decubitus Prep: site prepped and draped and DuraPrep Patient monitoring: heart rate, cardiac monitor, continuous pulse ox and blood pressure Approach: midline Location: L3-4 Injection technique: single-shot Needle Needle type: Quincke  Needle gauge: 22 G Needle length: 9 cm Needle insertion depth: 7 cm Assessment Sensory level: T6 Additional Notes Patient sedated and then turned to left lateral decubitus position. SAB performed as above. Patient tolerated procedure well. Adequate sensory level.

## 2018-12-25 NOTE — Discharge Instructions (Signed)
Orthopedic discharge instructions:  -Okay for full weightbearing as tolerated to the left leg. -Maintain postoperative bandage until follow-up appointment.  You may shower with this in place.  Do not submerge underwater. -For the prevention of blood clots take a 325 mg aspirin once daily for 6 weeks.

## 2018-12-26 LAB — BASIC METABOLIC PANEL
Anion gap: 10 (ref 5–15)
BUN: 31 mg/dL — ABNORMAL HIGH (ref 8–23)
CO2: 24 mmol/L (ref 22–32)
Calcium: 8.6 mg/dL — ABNORMAL LOW (ref 8.9–10.3)
Chloride: 101 mmol/L (ref 98–111)
Creatinine, Ser: 1.23 mg/dL — ABNORMAL HIGH (ref 0.44–1.00)
GFR calc Af Amer: 48 mL/min — ABNORMAL LOW (ref >60)
GFR calc non Af Amer: 41 mL/min — ABNORMAL LOW (ref >60)
Glucose, Bld: 182 mg/dL — ABNORMAL HIGH (ref 70–99)
Potassium: 4.1 mmol/L (ref 3.5–5.1)
Sodium: 135 mmol/L (ref 135–145)

## 2018-12-26 LAB — CBC
HCT: 38.2 % (ref 36.0–46.0)
Hemoglobin: 12.3 g/dL (ref 12.0–15.0)
MCH: 29 pg (ref 26.0–34.0)
MCHC: 32.2 g/dL (ref 30.0–36.0)
MCV: 90.1 fL (ref 80.0–100.0)
Platelets: 85 10*3/uL — ABNORMAL LOW (ref 150–400)
RBC: 4.24 MIL/uL (ref 3.87–5.11)
RDW: 13.2 % (ref 11.5–15.5)
WBC: 9.8 10*3/uL (ref 4.0–10.5)
nRBC: 0 % (ref 0.0–0.2)

## 2018-12-26 LAB — GLUCOSE, CAPILLARY
Glucose-Capillary: 198 mg/dL — ABNORMAL HIGH (ref 70–99)
Glucose-Capillary: 254 mg/dL — ABNORMAL HIGH (ref 70–99)
Glucose-Capillary: 203 mg/dL — ABNORMAL HIGH (ref 70–99)
Glucose-Capillary: 221 mg/dL — ABNORMAL HIGH (ref 70–99)

## 2018-12-26 MED ORDER — SODIUM CHLORIDE 0.9 % IV BOLUS
500.0000 mL | Freq: Once | INTRAVENOUS | Status: AC
  Administered 2018-12-26: 500 mL via INTRAVENOUS

## 2018-12-26 MED ORDER — NALOXONE HCL 0.4 MG/ML IJ SOLN: 0.4000 mg | INTRAMUSCULAR | Status: DC | PRN

## 2018-12-26 MED ORDER — NALOXONE HCL 0.4 MG/ML IJ SOLN
INTRAMUSCULAR | Status: AC
  Administered 2018-12-26: 0.2 mg
  Filled 2018-12-26: qty 1

## 2018-12-26 MED ORDER — LORAZEPAM 0.5 MG PO TABS: 0.5000 mg | ORAL_TABLET | Freq: Once | ORAL | Status: DC | PRN

## 2018-12-26 NOTE — Progress Notes (Signed)
Subjective:  Upon entering the room for afternoon rounds the nursing team was at the bedside following an episode of acute confusion from the patient as well as hypoxia and desaturation into the mid 50s on the pulse oximeter.  She was placed on 10 L of nasal cannula and laid flat and given a IV fluid bolus.  She did have some clearing of the sensorium but then continued to be very lethargic.  We did give her a dose of Narcan.  This seemed to mitigate the above findings.  She had only had 1 oral 5 mg Norco tablet at 230pm earlier today.   Following the above episode she was much more clear.  She denied any pain in the hip.  She states that she had been up to the bedside with therapy earlier today.  Objective:   VITALS:   Vitals:   12/26/18 0752 12/26/18 1143 12/26/18 1618 12/26/18 1625  BP: (!) 125/51 (!) 137/52 (!) 140/57   Pulse: 62 72 87   Resp: _0 Temp:  98.7 F (37.1 C) 98.7 F (37.1 C)   TempSrc:  Oral Oral   SpO2: 99% 95% 96% 93%  Weight:      Height:        Neurologically intact Neurovascular intact Sensation intact distally Intact pulses distally Dorsiflexion/Plantar flexion intact Incision: dressing C/D/I   Lab Results  Component Value Date   WBC 9.8 12/26/2018   HGB 12.3 12/26/2018   HCT 38.2 12/26/2018   MCV 90.1 12/26/2018   PLT 85 (L) 12/26/2018   BMET    Component Value Date/Time   NA 135 12/26/2018 0347   NA 139 05/03/2018 1416   K 4.1 12/26/2018 0347   CL 101 12/26/2018 0347   CO2 24 12/26/2018 0347   GLUCOSE 182 (H) 12/26/2018 0347   BUN 31 (H) 12/26/2018 0347   BUN 34 (H) 05/03/2018 1416   CREATININE 1.23 (H) 12/26/2018 0347   CREATININE 1.06 (H) 11/30/2017 1559   CALCIUM 8.6 (L) 12/26/2018 0347   GFRNONAA 41 (L) 12/26/2018 0347   GFRAA 48 (L) 12/26/2018 0347     Assessment/Plan: 1 Day Post-Op   Principal Problem:   Closed left hip fracture (HCC) Active Problems:   Hypertension   Diabetes mellitus type 2, insulin  dependent (HCC)   Chronic diastolic CHF (congestive heart failure) (HCC)   CKD (chronic kidney disease), stage III (HCC)   Advance diet Up with therapy -Okay for full weightbearing as tolerated to the left lower extremity.  She should maintain postoperative bandage until her follow-up appointment unless it becomes saturated and then it may be changed.  She can shower with this in place but should not submerge underwater.  -SCDs as well as aspirin for DVT prophylaxis at 325 mg once daily x6 weeks. -Follow-up with Dr. Stann Mainland in 2 to 3 weeks for postoperative follow-up.   Nicholes Stairs 12/26/2018, 4:45 PM   Geralynn Rile, MD [phone]

## 2018-12-26 NOTE — Progress Notes (Signed)
Occupational Therapy Evaluation Patient Details Name: Margaret Dyer MRN: [id number] DOB: Apr 13, 1935 Today's Date: 12/26/2018    History of Present Illness Patient is a 84 y/o female who presents sustained a left hip fracture, with intertrochanteric, pertrochanteric, subtrochanteric fracture with intramedullary implant. Found to have Acute on chronic diastolic CHF. Chest CT-Small dependent bilateral pleural effusions. s/p cardiac cath. PMH includes renal cell carcinoma, HTN, HLD, DM.   Clinical Impression   PTA Pt PLOF Modified independent with use of assistive devices. Pt lives in a one level home with one daughter that is not physically able to assist if returned back to home setting, as well as another daughter that works during the day.  Pt currently requires Max A in LB dressing/ bathing and functional transfers due to decreased UB strength, pain, and limited function. Pt will benefit from continued therapy to address increased function in ADLs to progress to next venue. Pt recommended to SNF with supervision for safety and increased skilled therapy to improve to PLOF.     Follow Up Recommendations  SNF;Supervision/Assistance - 24 hour    Equipment Recommendations  3 in 1 bedside commode    Recommendations for Other Services PT consult     Precautions / Restrictions Restrictions Weight Bearing Restrictions: Yes LLE Weight Bearing: Weight bearing as tolerated      Mobility Bed Mobility Overal bed mobility: Needs Assistance Bed Mobility: Supine to Sit     Supine to sit: Max assist;+2 for physical assistance;+2 for safety/equipment     General bed mobility comments: Pt requires Max A to transition from supine to sit at EOB due to decreased UB strength and pain of LLE. Pt required use of bed pad to assist with scooting to upright positioning bue to inability to power up.  Transfers Overall transfer level: Needs assistance Equipment used: 2 person hand held assist;Rolling  walker (2 wheeled) Transfers: Sit to/from Stand Sit to Stand: Max assist;+2 physical assistance;+2 safety/equipment         General transfer comment: Max A due to decreased strength and inability to power with BUE.     Balance Overall balance assessment: Needs assistance Sitting-balance support: Bilateral upper extremity supported;Feet supported Sitting balance-Leahy Scale: Poor Sitting balance - Comments: Required Max A of support from therapist to sit up right at EOB.  Postural control: Posterior lean Standing balance support: Bilateral upper extremity supported Standing balance-Leahy Scale: Poor                             ADL either performed or assessed with clinical judgement   ADL Overall ADL's : Needs assistance/impaired Eating/Feeding: Set up   Grooming: Set up;Wash/dry face;Oral care   Upper Body Bathing: Set up;Sitting   Lower Body Bathing: Maximal assistance;Cueing for safety;Cueing for sequencing;Bed level;With adaptive equipment   Upper Body Dressing : Set up;Sitting   Lower Body Dressing: Maximal assistance;With adaptive equipment;Cueing for safety;Cueing for sequencing;Bed level   Toilet Transfer: Maximal assistance;+2 for physical assistance;+2 for safety/equipment;Cueing for safety;Cueing for sequencing           Functional mobility during ADLs: Maximal assistance;+2 for physical assistance;+2 for safety/equipment;Cueing for safety;Cueing for sequencing General ADL Comments: Pt requires Max A for most functional tasks with LB and functional transfers Max A x2 for safety due to decreased strength of UB and core.      Vision         Perception     Praxis  Pertinent Vitals/Pain Pain Assessment: 0-10 Pain Score: 5  Pain Location: L hip Pain Descriptors / Indicators: Discomfort;Grimacing;Operative site guarding Pain Intervention(s): Limited activity within patient's tolerance;Monitored during session;Repositioned     Hand  Dominance Right   Extremity/Trunk Assessment Upper Extremity Assessment Upper Extremity Assessment: Generalized weakness   Lower Extremity Assessment Lower Extremity Assessment: Defer to PT evaluation       Communication Communication Communication: No difficulties   Cognition Arousal/Alertness: Awake/alert Behavior During Therapy: WFL for tasks assessed/performed Overall Cognitive Status: Within Functional Limits for tasks assessed                                     General Comments       Exercises     Shoulder Instructions      Home Living Family/patient expects to be discharged to:: Private residence Living Arrangements: Children   Type of Home: House Home Access: Stairs to enter Technical brewer of Steps: 2 Entrance Stairs-Rails: Can reach both Home Layout: One level     Bathroom Shower/Tub: Teacher, early years/pre: Handicapped height     Home Equipment: Environmental consultant - 2 wheels;Bedside commode   Additional Comments: Pt reports having a daughter that lives with her but she is not physically capable to assist/or help post surgery. Pt also mentions having another daughter that works during the day and is able to come and check on her after her work hours.       Prior Functioning/Environment Level of Independence: Independent with assistive device(s)        Comments: Pt states using AE such as walker and BSC but does not mention another other modifications or equipment needed in home setting.        OT Problem List: Decreased strength;Decreased range of motion;Decreased activity tolerance;Impaired balance (sitting and/or standing);Decreased safety awareness;Decreased knowledge of use of DME or AE;Decreased knowledge of precautions;Pain      OT Treatment/Interventions: Self-care/ADL training;Therapeutic exercise;DME and/or AE instruction;Balance training;Patient/family education    OT Goals(Current goals can be found in the care  plan section) Acute Rehab OT Goals Patient Stated Goal: "Would be happy to feel no pain." OT Goal Formulation: With patient Time For Goal Achievement: 01/09/19 Potential to Achieve Goals: Good  OT Frequency: Min 2X/week   Barriers to D/C: Decreased caregiver support  Family (2 daughters) are not as accessible as necessary to be safe in home setting.       Co-evaluation              AM-PAC OT "6 Clicks" Daily Activity     Outcome Measure Help from another person eating meals?: A Little Help from another person taking care of personal grooming?: A Little Help from another person toileting, which includes using toliet, bedpan, or urinal?: A Lot Help from another person bathing (including washing, rinsing, drying)?: A Lot Help from another person to put on and taking off regular upper body clothing?: A Lot Help from another person to put on and taking off regular lower body clothing?: A Lot 6 Click Score: 14   End of Session Equipment Utilized During Treatment: Gait belt Nurse Communication: Mobility status;Precautions  Activity Tolerance: Patient limited by pain Patient left: in bed;with call bell/phone within reach;with bed alarm set  OT Visit Diagnosis: Unsteadiness on feet (R26.81);History of falling (Z91.81);Other abnormalities of gait and mobility (R26.89)  Time: 0762-2633 OT Time Calculation (min): 28 min Charges:  OT General Charges $OT Visit: 1 Visit OT Evaluation $OT Eval Moderate Complexity: 1 Mod OT Treatments $Self Care/Home Management : 8-22 mins  Minus Breeding, MSOT, OTR/L  Supplemental Rehabilitation Services  [phone]  Marius Ditch 12/26/2018, 1:09 PM

## 2018-12-26 NOTE — Progress Notes (Signed)
Orthopedic Tech Progress Note Patient Details:  Margaret Dyer Feb 03, 1935 [id number]  Patient ID: Margaret Dyer, female   DOB: 01-17-1935, 84 y.o.   MRN: [id number] [facility] Karolee Stamps 12/26/2018, 2:12 PM

## 2018-12-26 NOTE — Evaluation (Signed)
Physical Therapy Evaluation Patient Details Name: Margaret Dyer MRN: [id number] DOB: 10/15/1935 Today's Date: 12/26/2018   History of Present Illness  Patient is a 84 y/o female who presents sustained a left hip fracture, with intertrochanteric, pertrochanteric, subtrochanteric fracture with intramedullary implant. Found to have Acute on chronic diastolic CHF. Chest CT-Small dependent bilateral pleural effusions. s/p cardiac cath. PMH includes renal cell carcinoma, HTN, HLD, DM.  Clinical Impression  Pt admitted with/for L hip fx, s/p IM nailing.  Pt needing significant assist to mobilize at this point.  Pt has stood, but not transferred or ambulated at this time..  Pt currently limited functionally due to the problems listed. ( See problems list.)   Pt will benefit from PT to maximize function and safety in order to get ready for next venue listed below.     Follow Up Recommendations SNF;Supervision/Assistance - 24 hour    Equipment Recommendations  Other (comment)(TBA)    Recommendations for Other Services       Precautions / Restrictions Restrictions Weight Bearing Restrictions: Yes LLE Weight Bearing: Weight bearing as tolerated      Mobility  Bed Mobility Overal bed mobility: Needs Assistance Bed Mobility: Supine to Sit     Supine to sit: Max assist;+2 for physical assistance;+2 for safety/equipment     General bed mobility comments: Pt requires Max A to transition from supine to sit at EOB due to decreased UB strength and pain of LLE. Pt required use of bed pad to assist with scooting to upright positioning bue to inability to power up.  Transfers Overall transfer level: Needs assistance Equipment used: 2 person hand held assist;Rolling walker (2 wheeled) Transfers: Sit to/from Stand Sit to Stand: Max assist;+2 physical assistance;+2 safety/equipment         General transfer comment: Max A due to decreased strength and inability to power with BUE.    Ambulation/Gait             General Gait Details: pt unable  Stairs            Wheelchair Mobility    Modified Rankin (Stroke Patients Only)       Balance Overall balance assessment: Needs assistance Sitting-balance support: Bilateral upper extremity supported;Feet supported Sitting balance-Leahy Scale: Poor Sitting balance - Comments: Required Max A of support from therapist to sit up right at EOB.  Postural control: Posterior lean Standing balance support: Bilateral upper extremity supported Standing balance-Leahy Scale: Poor                               Pertinent Vitals/Pain Pain Assessment: Faces Faces Pain Scale: Hurts even more Pain Location: L hip Pain Descriptors / Indicators: Discomfort;Grimacing;Operative site guarding Pain Intervention(s): Limited activity within patient's tolerance    Home Living Family/patient expects to be discharged to:: Private residence Living Arrangements: Children   Type of Home: House Home Access: Stairs to enter Entrance Stairs-Rails: Can reach both Entrance Stairs-Number of Steps: 2 Home Layout: One level Home Equipment: Environmental consultant - 2 wheels;Bedside commode Additional Comments: Pt reports having a daughter that lives with her but she is not physically capable to assist/or help post surgery. Pt also mentions having another daughter that works during the day and is able to come and check on her after her work hours.     Prior Function Level of Independence: Independent with assistive device(s)         Comments: Pt states using AE such  as walker and BSC but does not mention another other modifications or equipment needed in home setting.     Hand Dominance   Dominant Hand: Right    Extremity/Trunk Assessment   Upper Extremity Assessment Upper Extremity Assessment: Defer to OT evaluation    Lower Extremity Assessment Lower Extremity Assessment: LLE deficits/detail LLE Deficits / Details: pt  unable to move without AAROM, flexion ro 50* AA LLE: Unable to fully assess due to pain LLE Coordination: decreased fine motor;decreased gross motor       Communication   Communication: No difficulties  Cognition Arousal/Alertness: Awake/alert Behavior During Therapy: WFL for tasks assessed/performed Overall Cognitive Status: Within Functional Limits for tasks assessed                                        General Comments      Exercises Other Exercises Other Exercises: L LE aarom hip/knee flexion /extention   Assessment/Plan    PT Assessment Patient needs continued PT services  PT Problem List Decreased strength;Decreased activity tolerance;Decreased range of motion;Decreased mobility;Decreased knowledge of use of DME;Pain       PT Treatment Interventions DME instruction;Gait training;Functional mobility training;Therapeutic activities;Therapeutic exercise;Patient/family education    PT Goals (Current goals can be found in the Care Plan section)  Acute Rehab PT Goals Patient Stated Goal: "Would be happy to feel no pain." PT Goal Formulation: With patient Time For Goal Achievement: 01/09/19 Potential to Achieve Goals: Good    Frequency Min 3X/week   Barriers to discharge        Co-evaluation               AM-PAC PT "6 Clicks" Mobility  Outcome Measure Help needed turning from your back to your side while in a flat bed without using bedrails?: A Lot Help needed moving from lying on your back to sitting on the side of a flat bed without using bedrails?: A Lot Help needed moving to and from a bed to a chair (including a wheelchair)?: A Lot Help needed standing up from a chair using your arms (e.g., wheelchair or bedside chair)?: A Lot Help needed to walk in hospital room?: Total Help needed climbing 3-5 steps with a railing? : Total 6 Click Score: 10    End of Session   Activity Tolerance: Patient tolerated treatment well;Patient limited by  pain Patient left: in bed;with call bell/phone within reach;Other (comment)(pt back to bed due to going to MRI) Nurse Communication: Mobility status PT Visit Diagnosis: Other abnormalities of gait and mobility (R26.89);Pain Pain - Right/Left: Left Pain - part of body: Leg    Time: 1610-9604 PT Time Calculation (min) (ACUTE ONLY): 36 min   Charges:   PT Evaluation $PT Eval Moderate Complexity: 1 Mod PT Treatments $Self Care/Home Management: 8-22        12/26/2018  Donnella Sham, PT Acute Rehabilitation Services [phone]  (pager) [phone]  (office)  Margaret Dyer 12/26/2018, 6:46 PM

## 2018-12-26 NOTE — Anesthesia Postprocedure Evaluation (Signed)
Anesthesia Post Note  Patient: Margaret Dyer  Procedure(s) Performed: INTRAMEDULLARY (IM) NAIL INTERTROCHANTRIC (Left )     Patient location during evaluation: PACU Anesthesia Type: Spinal Level of consciousness: oriented and awake and alert Pain management: pain level controlled Vital Signs Assessment: post-procedure vital signs reviewed and stable Respiratory status: spontaneous breathing, respiratory function stable, patient connected to nasal cannula oxygen and nonlabored ventilation Cardiovascular status: blood pressure returned to baseline and stable Postop Assessment: no headache, no backache, no apparent nausea or vomiting, spinal receding and patient able to bend at knees Anesthetic complications: no                  Kaleyah Labreck A.

## 2018-12-26 NOTE — Progress Notes (Signed)
PROGRESS NOTE    LATESA FRATTO  XIH:[id number] DOB: 1935/02/23 DOA: 12/24/2018 PCP: Ann Held, DO      Brief Narrative:  Mrs. Folker is a 84 y.o. F with dCHF, HTN, NASH, remote RCC, CKD III, and DM who presented to the emergency room after mechanical fall on a concrete sidewalk outside her home.  Found to have left hip fracture on x-ray.       Assessment & Plan:   Left hip fracture Status post intramedullary intertrochanteric nail 1/22 No intraoperative complications.  Surgeon Dr. Stann Mainland. - Aspirin twice daily for DVT prophylaxis -PT evaluation   Tremor Patient and daughter noted tremor on the left hand to nursing today.  This was not present on my exam.  At the time of the tremor, the patient was alert and oriented, the tremor has been lasting since surgery per the patient, since prior to surgery per the daughter.  Left leg pain Patient initially described this to me as "leg weakness".  On exam, left lower extremity strength appeared intact, but effort was limited by pain.  Patient able to stand with PT and appeared to have symmetric strength in LEs.  Hypertension Chronic diastolic CHF Appears euvolemic -Continue amlodipine, metoprolol, hydralazine  Diabetes Glucose is well controlled -Continue Lantus -Continue sliding scale correction insulin -Continue gabapentin  Chronic kidney disease stage III Baseline creatinine 1.2, Stable relative to baseline  Hypothyroidism -Continue levothyroxine  Other medications -Continue oxybutynin  Thrombocytopenia Normal at admission, dropped to 88K today. -Trend Platelets      MDM and disposition: The below labs and imaging reports were reviewed and summarized above.  Medication management as above.  The patient was admitted with hip fracture.  She went to the OR for uncomplicated intramedullary hip nailing yesterday, no postoperative complications, no postoperative kidney injury or anemia.  We will progress  with therapy over the next 24 to 48 hours, [place] discharge tomorrow or Saturday..          DVT prophylaxis: aspirin BID and SCDs Code Status: FULL Family Communication: None present    Consultants:   Orthopedics  Procedures:   IM Nailing of right hip on 1/22  Antimicrobials:   None    Subjective: Complaining of tremor, left leg weakness.  No chest pain, no numbness, no slurred speech, no confusion, no fever, no vomiting.  Objective: Vitals:   12/26/18 0752 12/26/18 1143 12/26/18 1618 12/26/18 1625  BP: (!) 125/51 (!) 137/52 (!) 140/57   Pulse: 62 72 87   Resp: _0 Temp:  98.7 F (37.1 C) 98.7 F (37.1 C)   TempSrc:  Oral Oral   SpO2: 99% 95% 96% 93%  Weight:      Height:        Intake/Output Summary (Last 24 hours) at 12/26/2018 1921 Last data filed at 12/26/2018 1641 Gross per 24 hour  Intake 321 ml  Output 100 ml  Net 221 ml   Filed Weights   12/24/18 1905 12/25/18 1122  Weight: 86.2 kg 86 kg    Examination: General appearance:  adult female, alert and in no acute distress.   HEENT: Anicteric, conjunctiva pink, lids and lashes normal. No nasal deformity, discharge, epistaxis.  Lips moist.   Skin: Warm and dry.  no jaundice.  No suspicious rashes or lesions. Cardiac: RRR, nl S1-S2, no murmurs appreciated.  Capillary refill is brisk.  JVP normal.  No LE edema.  Radial pulses 2+ and symmetric. Respiratory: Normal respiratory rate and  rhythm.  CTAB without rales or wheezes. Abdomen: Abdomen soft.  No TTP. No ascites, distension, hepatosplenomegaly.   MSK: No deformities or effusions, left hip dressings clean dry and intact. Neuro: Awake and alert.  EOMI, upper extremities 5/5 and symmetric.  Right hip, knee, ankle strength 5/5, left ankle strength 5/5, but knee and hip flexors show inconsistent effort, limited by pain.  Gait antalgic. Speech fluent.   No tremor.   Psych: Sensorium intact and responding to questions, attention normal. Affect  normal.  Judgment and insight appear mildly impaired.    Data Reviewed: I have personally reviewed following labs and imaging studies:  CBC: Recent Labs  Lab 12/24/18 2057 12/26/18 0522  WBC 12.8* 9.8  NEUTROABS 11.0*  --   HGB 14.7 12.3  HCT 44.6 38.2  MCV 88.8 90.1  PLT 122* 85*   Basic Metabolic Panel: Recent Labs  Lab 12/24/18 2057 12/26/18 0347  NA 139 135  K 4.1 4.1  CL 104 101  CO2 24 24  GLUCOSE 199* 182*  BUN 30* 31*  CREATININE 1.15* 1.23*  CALCIUM 9.3 8.6*   GFR: Estimated Creatinine Clearance: 37.7 mL/min (A) (by C-G formula based on SCr of 1.23 mg/dL (H)). Liver Function Tests: No results for input(s): AST, ALT, ALKPHOS, BILITOT, PROT, ALBUMIN in the last 168 hours. No results for input(s): LIPASE, AMYLASE in the last 168 hours. No results for input(s): AMMONIA in the last 168 hours. Coagulation Profile: No results for input(s): INR, PROTIME in the last 168 hours. Cardiac Enzymes: No results for input(s): CKTOTAL, CKMB, CKMBINDEX, TROPONINI in the last 168 hours. BNP (last 3 results) Recent Labs    05/03/18 1416  PROBNP 399   HbA1C: No results for input(s): HGBA1C in the last 72 hours. CBG: Recent Labs  Lab 12/25/18 1622 12/25/18 2114 12/26/18 0617 12/26/18 1133 12/26/18 1615  GLUCAP 214* 309* 198* 254* 203*   Lipid Profile: No results for input(s): CHOL, HDL, LDLCALC, TRIG, CHOLHDL, LDLDIRECT in the last 72 hours. Thyroid Function Tests: No results for input(s): TSH, T4TOTAL, FREET4, T3FREE, THYROIDAB in the last 72 hours. Anemia Panel: No results for input(s): VITAMINB12, FOLATE, FERRITIN, TIBC, IRON, RETICCTPCT in the last 72 hours. Urine analysis:    Component Value Date/Time   COLORURINE YELLOW 05/17/2017 1212   APPEARANCEUR CLEAR 05/17/2017 1212   LABSPEC 1.010 05/17/2017 1212   PHURINE 5.0 05/17/2017 1212   GLUCOSEU NEGATIVE 05/17/2017 1212   HGBUR NEGATIVE 05/17/2017 1212   HGBUR large 02/17/2011 1432   BILIRUBINUR neg  12/17/2017 1310   KETONESUR NEGATIVE 05/17/2017 1212   PROTEINUR trace 12/17/2017 1310   PROTEINUR 100 (A) 05/17/2017 1212   UROBILINOGEN 0.2 12/17/2017 1310   UROBILINOGEN 0.2 02/17/2011 1432   NITRITE neg 12/17/2017 1310   NITRITE NEGATIVE 05/17/2017 1212   LEUKOCYTESUR Negative 12/17/2017 1310   Sepsis Labs: _0 (procalcitonin:4,lacticacidven:4)  )No results found for this or any previous visit (from the past 240 hour(s)).       Radiology Studies: Dg Chest 1 View  Result Date: 12/24/2018 CLINICAL DATA:  Status post fall. EXAM: CHEST  1 VIEW COMPARISON:  12/03/2017 FINDINGS: Enlarged cardiac silhouette. Calcific atherosclerotic disease of the aorta. There is no evidence of focal airspace consolidation, pleural effusion or pneumothorax. Stable left lung scarring. Osseous structures are without acute abnormality. Soft tissues are grossly normal. IMPRESSION: Enlarged cardiac silhouette. Calcific atherosclerotic disease of the aorta. Electronically Signed   By: Fidela Salisbury M.D.   On: 12/24/2018 20:31   Dg Knee 1-2 Views  Left  Result Date: 12/25/2018 CLINICAL DATA:  84 year old female with a history of fall and pain EXAM: LEFT KNEE - 1-2 VIEW COMPARISON:  None. FINDINGS: No acute displaced fracture. Mild joint space narrowing of the mediolateral compartment. Evidence of chondrocalcinosis. Degenerative changes of the patellofemoral joint. No joint effusion. Osteopenia. Vascular calcifications. IMPRESSION: No acute bony abnormality. Tricompartmental osteoarthritis. Chondrocalcinosis Atherosclerosis Electronically Signed   By: Corrie Mckusick D.O.   On: 12/25/2018 10:39   Dg C-arm 1-60 Min  Result Date: 12/25/2018 CLINICAL DATA:  Hip fracture repair FLUOROSCOPY TIME:  50 seconds. Images: 4 EXAM: DG C-ARM 61-120 MIN COMPARISON:  December 24, 2018 FINDINGS: Patient is status post left hip fracture repair. A gamma nail and intramedullary femoral rod are identified. A distal  interlocking screw is noted. Hardware is in good position. IMPRESSION: Left hip fracture repair as above. Electronically Signed   By: Dorise Bullion III M.D   On: 12/25/2018 13:28   Dg Hip Unilat With Pelvis 2-3 Views Left  Result Date: 12/24/2018 CLINICAL DATA:  Fall.  Hip pain EXAM: DG HIP (WITH OR WITHOUT PELVIS) 2-3V LEFT COMPARISON:  None. FINDINGS: There is an acute and comminuted fracture deformity involving the intertrochanteric portion of the proximal left femur. Medial angulation of the distal fracture fragments identified. IMPRESSION: 1. Acute and comminuted fracture deformity involves the intertrochanteric portions of the proximal left femur. Electronically Signed   By: Kerby Moors M.D.   On: 12/24/2018 20:31   Dg Femur Min 2 Views Left  Result Date: 12/25/2018 CLINICAL DATA:  Hip fracture repair FLUOROSCOPY TIME:  50 seconds. Images: 4 EXAM: DG C-ARM 61-120 MIN COMPARISON:  December 24, 2018 FINDINGS: Patient is status post left hip fracture repair. A gamma nail and intramedullary femoral rod are identified. A distal interlocking screw is noted. Hardware is in good position. IMPRESSION: Left hip fracture repair as above. Electronically Signed   By: Dorise Bullion III M.D   On: 12/25/2018 13:28        Scheduled Meds: . amLODipine  10 mg Oral q morning - 10a  . aspirin  325 mg Oral Daily  . docusate sodium  100 mg Oral BID  . gabapentin  300 mg Oral TID  . hydrALAZINE  25 mg Oral Q8H  . insulin aspart  0-9 Units Subcutaneous TID WC  . insulin glargine  10 Units Subcutaneous QHS  . levothyroxine  25 mcg Oral Daily  . metoprolol tartrate  25 mg Oral BID  . oxybutynin  5 mg Oral q morning - 10a   Continuous Infusions: . lactated ringers       LOS: 2 days    Time spent: 25 minutes    Edwin Dada, MD [facility] 12/26/2018, 7:21 PM     Please page through [place]:  [email or web address] Password [email or web address] If 7PM-7AM, please contact night-coverage

## 2018-12-26 NOTE — Progress Notes (Deleted)
Orthopedic Tech Progress Note Patient Details:  Margaret Dyer [birth year] [id number]      Post Interventions Patient Tolerated: Well Instructions Provided: Care of device, Adjustment of device   Karolee Stamps 12/26/2018, 2:12 PM

## 2018-12-26 NOTE — Progress Notes (Addendum)
6568 Assessed pt, c/o new hand tremors and visible mostly to the left hand. Strong and equal strengths to both arms and hand grips.  Speech is clear. No facial droop. V/S checked, WNL. 0805 Dr Loleta Books notified.  1400 Pt stood up at the bedside with PT, urinated in large amount. Pt c/o left hip pain after PT, prn meds given. 1618 Pt was lethargic, O2 sats 55% at room air. O2 restarted, IVF bolus given. Dr Stann Mainland rounding and seen pt. Narcan was given. Pt is back to baseline. A&O x4. Eating dinner now.

## 2018-12-26 NOTE — Progress Notes (Signed)
Inpatient Diabetes Program Recommendations  AACE/ADA: New Consensus Statement on Inpatient Glycemic Control   Target Ranges:  Prepandial:   less than 140 mg/dL      Peak postprandial:   less than 180 mg/dL (1-2 hours)      Critically ill patients:  140 - 180 mg/dL   Results for Margaret Dyer, Margaret Dyer (MRN [id number]) as of 12/26/2018 12:43  Ref. Range 12/25/2018 00:08 12/25/2018 11:07 12/25/2018 13:23 12/25/2018 16:22 12/25/2018 21:14 12/26/2018 06:17 12/26/2018 11:33  Glucose-Capillary Latest Ref Range: 70 - 99 mg/dL 173 (H) 171 (H) 200 (H) 214 (H) 309 (H) 198 (H) 254 (H)   Review of Glycemic Control  Diabetes history: DM2 Outpatient Diabetes medications: Lantus 25-30 units QHS, Apidra 10-15 units BID Current orders for Inpatient glycemic control: Lantus 10 units QHS, Novolog 0-9 units TID with meals  Inpatient Diabetes Program Recommendations:  Insulin - Basal: Please consider increasing Lantus to 13 units QHS. Correction (SSI): Please consider ordering Novolog 0-5 units QHS for bedtime correction. Insulin - Meal Coverage: Please consider ordering Novolog 4 units TID with meals for meal coverage if patient eats at least 50% of meals. HbgA1C: Please consider ordering an A1C to evaluate glycemic control over the past 2-3 months.  Thanks, Barnie Alderman, RN, MSN, CDE Diabetes Coordinator Inpatient Diabetes Program [phone] (Team Pager from 8am to 5pm)

## 2018-12-26 NOTE — Plan of Care (Signed)
  Problem: Education: Goal: Knowledge of General Education information will improve Description Including pain rating scale, medication(s)/side effects and non-pharmacologic comfort measures Outcome: Progressing Note:  POC reviewed with pt. and pain management.

## 2018-12-26 NOTE — Progress Notes (Signed)
Nutrition Brief Note  Received consult from the hip fracture protocol. Patient has had no recent weight changes. She was eating well at home and has been eating well today. She was NPO for surgery yesterday. Nutrition focused physical exam completed.  No muscle or subcutaneous fat depletion noticed.  Body mass index is 33.06 kg/m. Patient meets criteria for obesity based on current BMI.   Current diet order is CHO-modified, patient is consuming approximately 100% of meals at this time. Labs and medications reviewed.   No nutrition interventions warranted at this time. If nutrition issues arise, please consult RD.   Molli Barrows, RD, LDN, [place] Pager [phone] After Hours Pager [phone]

## 2018-12-27 ENCOUNTER — Encounter (HOSPITAL_COMMUNITY): Payer: Self-pay | Admitting: Orthopedic Surgery

## 2018-12-27 DIAGNOSIS — S72002A Fracture of unspecified part of neck of left femur, initial encounter for closed fracture: Secondary | ICD-10-CM

## 2018-12-27 LAB — CBC
HCT: 33.7 % — ABNORMAL LOW (ref 36.0–46.0)
HEMOGLOBIN: 11.2 g/dL — AB (ref 12.0–15.0)
MCH: 29.7 pg (ref 26.0–34.0)
MCHC: 33.2 g/dL (ref 30.0–36.0)
MCV: 89.4 fL (ref 80.0–100.0)
Platelets: 79 10*3/uL — ABNORMAL LOW (ref 150–400)
RBC: 3.77 MIL/uL — ABNORMAL LOW (ref 3.87–5.11)
RDW: 13.1 % (ref 11.5–15.5)
WBC: 9.6 10*3/uL (ref 4.0–10.5)
nRBC: 0 % (ref 0.0–0.2)

## 2018-12-27 LAB — BASIC METABOLIC PANEL
Anion gap: 7 (ref 5–15)
BUN: 36 mg/dL — ABNORMAL HIGH (ref 8–23)
CO2: 26 mmol/L (ref 22–32)
Calcium: 8.3 mg/dL — ABNORMAL LOW (ref 8.9–10.3)
Chloride: 102 mmol/L (ref 98–111)
Creatinine, Ser: 1.34 mg/dL — ABNORMAL HIGH (ref 0.44–1.00)
GFR calc Af Amer: 43 mL/min — ABNORMAL LOW (ref >60)
GFR calc non Af Amer: 37 mL/min — ABNORMAL LOW (ref >60)
Glucose, Bld: 198 mg/dL — ABNORMAL HIGH (ref 70–99)
Potassium: 4.1 mmol/L (ref 3.5–5.1)
Sodium: 135 mmol/L (ref 135–145)

## 2018-12-27 LAB — GLUCOSE, CAPILLARY
Glucose-Capillary: 208 mg/dL — ABNORMAL HIGH (ref 70–99)
Glucose-Capillary: 231 mg/dL — ABNORMAL HIGH (ref 70–99)
Glucose-Capillary: 219 mg/dL — ABNORMAL HIGH (ref 70–99)
Glucose-Capillary: 307 mg/dL — ABNORMAL HIGH (ref 70–99)

## 2018-12-27 NOTE — Progress Notes (Signed)
Inpatient Diabetes Program Recommendations  AACE/ADA: New Consensus Statement on Inpatient Glycemic Control (2015)  Target Ranges:  Prepandial:   less than 140 mg/dL      Peak postprandial:   less than 180 mg/dL (1-2 hours)      Critically ill patients:  140 - 180 mg/dL   Results for Margaret Dyer, Margaret Dyer (MRN [id number]) as of 12/27/2018 12:00  Ref. Range 12/26/2018 06:17 12/26/2018 11:33 12/26/2018 16:15 12/26/2018 21:16  Glucose-Capillary Latest Ref Range: 70 - 99 mg/dL 198 (H)  2 units NOVOLOG  254 (H)  5 units NOVOLOG  203 (H)  3 units NOVOLOG  221 (H)    10 units LANTUS   Results for Margaret Dyer, Margaret Dyer (MRN [id number]) as of 12/27/2018 12:00  Ref. Range 12/27/2018 06:30  Glucose-Capillary Latest Ref Range: 70 - 99 mg/dL 208 (H)  3 units NOVOLOG     Home DM Meds: Lantus 25-30 units QHS       Apidra 10-15 units BID  Current Orders: Lantus 10 units QHS      Novolog Sensitive Correction Scale/ SSI (0-9 units) TID AC      MD- Please consider the following in-hospital insulin adjustments:  1. Increase Lantus to 13 units QHS (30% increase)  2. Start Novolog Meal Coverage: Novolog 5 units TID with meals (1/3 total home dose)  (Please add the following Hold Parameters: Hold if pt eats <50% of meal, Hold if pt NPO)    --Will follow patient during hospitalization--  Wyn Quaker RN, MSN, CDE Diabetes Coordinator Inpatient Glycemic Control Team Team Pager: [phone] (8a-5p)

## 2018-12-27 NOTE — Progress Notes (Signed)
Patient 02 saturation decreasing to 50s. Patient removing nasal cannula with no memory of doing so. Returns back to 90s after reapplied. Patient 02 saturation 88% with 2.5 L during morning rounding. Increased to 3L and sat patient up. Levels increased to mid 90s. No signs of distress or discomfort during desaturation. Patient resting at that time.

## 2018-12-27 NOTE — Progress Notes (Signed)
Physical Therapy Treatment Patient Details Name: Margaret Dyer MRN: [id number] DOB: 04-20-1935 Today's Date: 12/27/2018    History of Present Illness Patient is a 84 y/o female who presents sustained a left hip fracture, with intertrochanteric, pertrochanteric, subtrochanteric fracture with intramedullary implant. Found to have Acute on chronic diastolic CHF. Chest CT-Small dependent bilateral pleural effusions. s/p cardiac cath. PMH includes renal cell carcinoma, HTN, HLD, DM.    PT Comments    Pt supine in bed on arrival sleeping.  She required increased time to wake and drowsy during first half of session.  Pt performed bed mobility to sit edge of bed with +2 max assistance.  Pt untilized +2 assistance and sara stedy to achieve standing from elevated bed.  Pt required max assistance +2 for intial stand and from standard seat height ( BSC).  Pt able to stand with mod +2 from elevated surfaces.  Pt performed minimal LE strengthening due to level of arousal.  Post session patient more awake and eating her lunch in the recliner chair with needs in reach and chair alarm set.  Nursing aware to transfer patient back with maxi move lift equipment.    Follow Up Recommendations  SNF;Supervision/Assistance - 24 hour     Equipment Recommendations  Other (comment)(TBA)    Recommendations for Other Services       Precautions / Restrictions Precautions Precautions: Fall Restrictions Weight Bearing Restrictions: Yes LLE Weight Bearing: Weight bearing as tolerated    Mobility  Bed Mobility Overal bed mobility: Needs Assistance Bed Mobility: Supine to Sit     Supine to sit: Max assist;+2 for physical assistance     General bed mobility comments: Pt required assistance to advance B LEs to edge of bed and to elevate trunk into sitting.  Pt drowsy and limited due to pain.    Transfers Overall transfer level: Needs assistance Equipment used: Ambulation equipment used(utilized sara stedy to  achieve standing.  ) Transfers: Sit to/from Stand Sit to Stand: Mod assist;Max assist;+2 physical assistance         General transfer comment: Pt initially required max +2 from elevated bed.  Once in stedy to stand from higher seated plates of stedy she required mod +2.  From [facility] she required max +2.  IN summary required max +2 from standard seat height and mod +2 from elevate seat height.    Ambulation/Gait Ambulation/Gait assistance: (NT focused on transfers and she continues to lack ability to transfer into standing without external assistance.  )               Stairs             Wheelchair Mobility    Modified Rankin (Stroke Patients Only)       Balance Overall balance assessment: Needs assistance   Sitting balance-Leahy Scale: Poor Sitting balance - Comments: required mod to min assistance for seated balance at edge of bed with B LE support balance improves to fair.       Standing balance-Leahy Scale: Poor Standing balance comment: Heavily reliant on UE support and external support.                              Cognition Arousal/Alertness: Awake/alert Behavior During Therapy: WFL for tasks assessed/performed Overall Cognitive Status: Impaired/Different from baseline Area of Impairment: Orientation;Following commands;Safety/judgement;Problem solving                 Orientation Level: Time  Following Commands: Follows one step commands with increased time Safety/Judgement: Decreased awareness of safety;Decreased awareness of deficits   Problem Solving: Slow processing;Decreased initiation;Difficulty sequencing;Requires verbal cues;Requires tactile cues        Exercises General Exercises - Lower Extremity Ankle Circles/Pumps: AROM;Both;10 reps;Supine Quad Sets: AROM;Left;5 reps;Supine Heel Slides: Left;10 reps;AAROM;Supine    General Comments        Pertinent Vitals/Pain Pain Assessment: Faces Faces Pain Scale: Hurts even  more Pain Location: L hip Pain Descriptors / Indicators: Discomfort;Grimacing;Operative site guarding Pain Intervention(s): Monitored during session;Repositioned    Home Living                      Prior Function            PT Goals (current goals can now be found in the care plan section) Acute Rehab PT Goals Patient Stated Goal: "Would be happy to feel no pain." Potential to Achieve Goals: Fair Progress towards PT goals: Progressing toward goals    Frequency    Min 3X/week      PT Plan Current plan remains appropriate    Co-evaluation              AM-PAC PT "6 Clicks" Mobility   Outcome Measure  Help needed turning from your back to your side while in a flat bed without using bedrails?: A Lot Help needed moving from lying on your back to sitting on the side of a flat bed without using bedrails?: A Lot Help needed moving to and from a bed to a chair (including a wheelchair)?: Total Help needed standing up from a chair using your arms (e.g., wheelchair or bedside chair)?: A Lot Help needed to walk in hospital room?: Total Help needed climbing 3-5 steps with a railing? : Total 6 Click Score: 9    End of Session Equipment Utilized During Treatment: Gait belt Activity Tolerance: Patient tolerated treatment well;Patient limited by pain Patient left: with call bell/phone within reach;Other (comment);in chair;with chair alarm set(maximove sling placed under patient for back to bed transfer.  ) Nurse Communication: Mobility status PT Visit Diagnosis: Other abnormalities of gait and mobility (R26.89);Pain Pain - Right/Left: Left Pain - part of body: Leg     Time: 9223-0097 PT Time Calculation (min) (ACUTE ONLY): 23 min  Charges:  $Therapeutic Exercise: 8-22 mins $Therapeutic Activity: 8-22 mins                     Governor Rooks, PTA Acute Rehabilitation Services Pager [phone] Office [phone]     Yadiel Aubry Eli Hose 12/27/2018, 12:04 PM

## 2018-12-27 NOTE — Care Management Important Message (Signed)
Important Message  Patient Details  Name: Margaret Dyer MRN: [id number] Date of Birth: 15-Nov-1935   Medicare Important Message Given:  Yes    Chani Ghanem P [place] 12/27/2018, 11:25 AM

## 2018-12-27 NOTE — Clinical Social Work Note (Signed)
Clinical Social Work Assessment  Patient Details  Name: Margaret Dyer MRN: [id number] Date of Birth: 1935/09/21  Date of referral:  12/27/18               Reason for consult:  Discharge Planning                Permission sought to share information with:  Case Manager, Facility Sport and exercise psychologist, Family Supports Permission granted to share information::  Yes, Verbal Permission Granted  Name::     Margaret Dyer  Agency::  SNFs  Relationship::  daughter  Contact Information:  [phone]  Housing/Transportation Living arrangements for the past 2 months:  [place] of Information:  Patient Patient Interpreter Needed:  None Criminal Activity/Legal Involvement Pertinent to Current Situation/Hospitalization:  No - Comment as needed Significant Relationships:  Adult Children Lives with:  Adult Children Do you feel safe going back to the place where you live?  No Need for family participation in patient care:  Yes (Comment)  Care giving concerns:  CSW received referral for possible SNF placement at time of discharge. Spoke with patient regarding possibility of SNF placement . Patient's family   is currently unable to care for her at their home given patient's current needs and fall risk.  Patient and  Daughter Margaret Dyer expressed understanding of PT recommendation and are agreeable to SNF placement at time of discharge. CSW to continue to follow and assist with discharge planning needs.     Social Worker assessment / plan:  Spoke with patient and daughterconcerning possibility of rehab at SNF before returning home.     Employment status:  Retired Forensic scientist:  Medicare PT Recommendations:  [place] / Referral to community resources:  [place]  Patient/Family's Response to care:  Patient and daughter Margaret Dyer  recognize need for rehab before returning home and are agreeable to a SNF in [place]. They report  preference for [facility] . CSW explained insurance authorization process. Patient's family reported that they want patient to get stronger to be able to come back home.    Patient/Family's Understanding of and Emotional Response to Diagnosis, Current Treatment, and Prognosis:  Patient/family is realistic regarding therapy needs and expressed being hopeful for SNF placement. Patient expressed understanding of CSW role and discharge process as well as medical condition. No questions/concerns about plan or treatment.    Emotional Assessment Appearance:  Appears stated age Attitude/Demeanor/Rapport:  Gracious Affect (typically observed):  Accepting Orientation:  Oriented to Self, Oriented to Place, Oriented to  Time, Oriented to Situation Alcohol / Substance use:  Not Applicable Psych involvement (Current and /or in the community):  No (Comment)  Discharge Needs  Concerns to be addressed:  Discharge Planning Concerns Readmission within the last 30 days:  No Current discharge risk:  Dependent with Mobility Barriers to Discharge:  Continued Medical Work up   [employer], LCSW 12/27/2018, 11:42 AM

## 2018-12-27 NOTE — Progress Notes (Signed)
PROGRESS NOTE    Margaret Dyer  JEH:[id number] DOB: 07-03-1935 DOA: 12/24/2018 PCP: Ann Held, DO   Brief Narrative:  Margaret Dyer is a 84 y.o. F with dCHF, HTN, NASH, remote RCC, CKD III, and DM who presented to the emergency room after mechanical fall on a concrete sidewalk outside her home.  Found to have left hip fracture on x-ray.  12/27/2018: Patient seen.  No new complaints.  Patient is awaiting disposition.  Assessment & Plan: Left hip fracture Status post intramedullary intertrochanteric nail 1/22 No intraoperative complications.  Surgeon Dr. Stann Mainland. - Aspirin twice daily for DVT prophylaxis -PT evaluation 12/27/2018: Patient is stable for discharge.  Kindly pursue disposition.  Tremor Patient and daughter noted tremor on the left hand to nursing today.  This was not present on my exam.  At the time of the tremor, the patient was alert and oriented, the tremor has been lasting since surgery per the patient, since prior to surgery per the daughter. 12/27/2018: Continue to monitor.  No tremors noted today.  Left leg pain Patient initially described this to me as "leg weakness".  On exam, left lower extremity strength appeared intact, but effort was limited by pain.  Patient able to stand with PT and appeared to have symmetric strength in LEs.  Hypertension Chronic diastolic CHF Appears euvolemic -Continue amlodipine, metoprolol, hydralazine  Diabetes Glucose is well controlled -Continue Lantus -Continue sliding scale correction insulin -Continue gabapentin 12/27/2018: Continue to optimize.  Chronic kidney disease stage III Baseline creatinine 1.2 Continue to monitor.  Hypothyroidism -Continue levothyroxine  Other medications -Continue oxybutynin  Thrombocytopenia Normal at admission, dropped to 88K today. -Trend Platelets  DVT prophylaxis: aspirin BID and SCDs Code Status: FULL Family Communication: None present  Consultants:    Orthopedics  Procedures:   Intramedullary Nailing left hip on 1/22  Antimicrobials:   None    Subjective: Patient seen. No new complaints. Patient is awaiting discharge.  Objective: Vitals:   12/27/18 0330 12/27/18 0830 12/27/18 0835 12/27/18 1300  BP: 130/61  (!) 146/51 (!) 118/54  Pulse: 74 84 85 67  Resp: 16     Temp: 98.9 F (37.2 C)   98.8 F (37.1 C)  TempSrc: Oral   Oral  SpO2: 94%  94% 95%  Weight:      Height:        Intake/Output Summary (Last 24 hours) at 12/27/2018 1556 Last data filed at 12/27/2018 1345 Gross per 24 hour  Intake 481 ml  Output 650 ml  Net -169 ml   Filed Weights   12/24/18 1905 12/25/18 1122  Weight: 86.2 kg 86 kg    Examination: General appearance:  adult female, alert and in no acute distress.   HEENT: Mild pallor.  No jaundice.   Cardiac: S1-S2.  Respiratory: Normal respiratory rate and rhythm.  CTAB without rales or wheezes. Abdomen: Abdomen soft.  No TTP. No ascites, distension, hepatosplenomegaly.   Neuro: Awake and alert.    Data Reviewed: I have personally reviewed following labs and imaging studies:  CBC: Recent Labs  Lab 12/24/18 2057 12/26/18 0522 12/27/18 0224  WBC 12.8* 9.8 9.6  NEUTROABS 11.0*  --   --   HGB 14.7 12.3 11.2*  HCT 44.6 38.2 33.7*  MCV 88.8 90.1 89.4  PLT 122* 85* 79*   Basic Metabolic Panel: Recent Labs  Lab 12/24/18 2057 12/26/18 0347 12/27/18 0224  NA 139 135 135  K 4.1 4.1 4.1  CL 104 101 102  CO2 24  24 26  GLUCOSE 199* 182* 198*  BUN 30* 31* 36*  CREATININE 1.15* 1.23* 1.34*  CALCIUM 9.3 8.6* 8.3*   GFR: Estimated Creatinine Clearance: 34.6 mL/min (A) (by C-G formula based on SCr of 1.34 mg/dL (H)). Liver Function Tests: No results for input(s): AST, ALT, ALKPHOS, BILITOT, PROT, ALBUMIN in the last 168 hours. No results for input(s): LIPASE, AMYLASE in the last 168 hours. No results for input(s): AMMONIA in the last 168 hours. Coagulation Profile: No results for  input(s): INR, PROTIME in the last 168 hours. Cardiac Enzymes: No results for input(s): CKTOTAL, CKMB, CKMBINDEX, TROPONINI in the last 168 hours. BNP (last 3 results) Recent Labs    05/03/18 1416  PROBNP 399   HbA1C: No results for input(s): HGBA1C in the last 72 hours. CBG: Recent Labs  Lab 12/26/18 1133 12/26/18 1615 12/26/18 2116 12/27/18 0630 12/27/18 1157  GLUCAP 254* 203* 221* 208* 231*   Lipid Profile: No results for input(s): CHOL, HDL, LDLCALC, TRIG, CHOLHDL, LDLDIRECT in the last 72 hours. Thyroid Function Tests: No results for input(s): TSH, T4TOTAL, FREET4, T3FREE, THYROIDAB in the last 72 hours. Anemia Panel: No results for input(s): VITAMINB12, FOLATE, FERRITIN, TIBC, IRON, RETICCTPCT in the last 72 hours. Urine analysis:    Component Value Date/Time   COLORURINE YELLOW 05/17/2017 1212   APPEARANCEUR CLEAR 05/17/2017 1212   LABSPEC 1.010 05/17/2017 1212   PHURINE 5.0 05/17/2017 1212   GLUCOSEU NEGATIVE 05/17/2017 1212   HGBUR NEGATIVE 05/17/2017 1212   HGBUR large 02/17/2011 1432   BILIRUBINUR neg 12/17/2017 1310   KETONESUR NEGATIVE 05/17/2017 1212   PROTEINUR trace 12/17/2017 1310   PROTEINUR 100 (A) 05/17/2017 1212   UROBILINOGEN 0.2 12/17/2017 1310   UROBILINOGEN 0.2 02/17/2011 1432   NITRITE neg 12/17/2017 1310   NITRITE NEGATIVE 05/17/2017 1212   LEUKOCYTESUR Negative 12/17/2017 1310   Sepsis Labs: _0 (procalcitonin:4,lacticacidven:4)  )No results found for this or any previous visit (from the past 240 hour(s)).       Radiology Studies: No results found.      Scheduled Meds: . amLODipine  10 mg Oral q morning - 10a  . aspirin  325 mg Oral Daily  . docusate sodium  100 mg Oral BID  . gabapentin  300 mg Oral TID  . hydrALAZINE  25 mg Oral Q8H  . insulin aspart  0-9 Units Subcutaneous TID WC  . insulin glargine  10 Units Subcutaneous QHS  . levothyroxine  25 mcg Oral Daily  . metoprolol tartrate  25 mg Oral BID  .  oxybutynin  5 mg Oral q morning - 10a   Continuous Infusions: . lactated ringers       LOS: 3 days   Time spent: 25 minutes  Bonnell Public, MD [facility] 12/27/2018, 3:56 PM   Please page through AMION:  [email or web address] Password [email or web address] If 7PM-7AM, please contact night-coverage

## 2018-12-27 NOTE — NC FL2 (Signed)
[place] LEVEL OF CARE SCREENING TOOL     IDENTIFICATION  Patient Name: Margaret Dyer Birthdate: February 08, 1935 Sex: female Admission Date (Current Location): 12/24/2018  [facility] and [place] Number:  Herbalist and Address:  [facility] [place], [place] [address], [place], [place] [address]      Provider Number: [id number]  Attending Physician Name and Address:  Bonnell Public, MD  Relative Name and Phone Number:  [name] (daughter) [phone]    Current Level of Care: Hospital Recommended Level of Care: [place] Prior Approval Number:    Date Approved/Denied:   PASRR Number: [id number]  Discharge Plan: SNF    Current Diagnoses: Patient Active Problem List   Diagnosis Date Noted  . Closed left hip fracture ([place]) 12/24/2018  . Dysuria 12/17/2017  . Dizziness 12/17/2017  . Community acquired pneumonia 12/17/2017  . Pneumonia 12/01/2017  . Urinary incontinence 11/09/2017  . Tic 11/09/2017  . CKD (chronic kidney disease), stage III ([place]) 09/12/2017  . Pulmonary hypertension ([place]) 09/12/2017  . Chronic diastolic CHF (congestive heart failure) ([place]) 06/05/2017  . Oral candida 05/29/2017  . Vitamin D deficiency 05/29/2017  . Acute on chronic respiratory failure with hypoxia ([place]) 05/24/2017  . Anxiety 05/24/2017  . HCAP (healthcare-associated pneumonia) 05/22/2017  . Right heart failure ([place]) 05/17/2017  . Acute diastolic CHF (congestive heart failure) ([place])   . Exertional dyspnea 05/13/2017  . Hypertension 05/13/2017  . Asthma 05/13/2017  . DOE (dyspnea on exertion) 05/13/2017  . Diabetes mellitus type 2, insulin dependent ([place]) 05/13/2017  . Acute kidney injury ([place]) 05/13/2017  . History of nephrectomy 05/13/2017  . Hyperlipidemia LDL goal <70 05/13/2017  . Liver cirrhosis secondary to NASH ([place]) 05/13/2017  . Thrombocytopenia ([place]) 05/13/2017  . Renal cell carcinoma 05/13/2017  . Hypothyroidism  05/13/2017  . Hypersplenism 05/13/2017  . Osteoarthritis 05/13/2017  . Overactive bladder 05/13/2017  . URI, acute 02/22/2017  . Abrasion, left knee, initial encounter 02/22/2017  . CTS (carpal tunnel syndrome) 03/04/2016  . Bilateral contusion of ribs 10/05/2015  . Dental abscess 04/15/2014  . Claudication, intermittent ([place]) 12/05/2013  . Obesity (BMI 30-39.9) 07/22/2013  . Back pain 05/26/2013  . Platelets decreased ([place]) 05/26/2013  . Breast pain, right 04/21/2013  . Anxiety and depression 12/05/2012  . Bronchitis 11/06/2012  . HEMATURIA UNSPECIFIED 02/17/2011  . Hyperlipidemia 09/08/2010  . B12 DEFICIENCY 07/22/2010  . DIZZINESS 07/04/2010  . Essential hypertension 06/21/2010  . GOUT, UNSPECIFIED 01/27/2010  . NEPHRECTOMY, HX OF 12/02/2009  . NECK PAIN, LEFT 09/04/2008  . PARESTHESIA 09/02/2007  . GOITER NOS 04/23/2007  . Diabetes mellitus ([place]) 04/23/2007  . Asthma 04/23/2007  . CALCULUS, KIDNEY 04/23/2007  . OSTEOPENIA 04/23/2007  . CARCINOMA, RENAL CELL 08/10/1999    Orientation RESPIRATION BLADDER Height & Weight     Self, Time, Situation, Place  O2(nasal cannula 3L/min) Incontinent, External catheter Weight: 86 kg Height:  5' 3.5" (161.3 cm)  BEHAVIORAL SYMPTOMS/MOOD NEUROLOGICAL BOWEL NUTRITION STATUS      Continent Diet(see discharge summary)  AMBULATORY STATUS COMMUNICATION OF NEEDS Skin   Extensive Assist Verbally Other (Comment)(ecchymosis on both arms, left hip closed surgical incision, right arm closed incision)                       Personal Care Assistance Level of Assistance  Bathing, Feeding, Dressing, Total care Bathing Assistance: Maximum assistance Feeding assistance: Independent Dressing Assistance: Maximum assistance Total Care Assistance: Maximum assistance  Functional Limitations Info  Hearing, Speech, Sight Sight Info: Impaired(glasses) Hearing Info: Adequate Speech Info: Adequate    SPECIAL CARE FACTORS FREQUENCY  PT (By  licensed PT), OT (By licensed OT)     PT Frequency: min 5x weekly OT Frequency: min 5x weekly            Contractures Contractures Info: Not present    Additional Factors Info  Code Status, Allergies Code Status Info: full Allergies Info: Cefuroxime Axetil, Ciprofloxacin           Current Medications (12/27/2018):  This is the current hospital active medication list Current Facility-Administered Medications  Medication Dose Route Frequency Provider Last Rate Last Dose  . acetaminophen (TYLENOL) tablet 325-650 mg  325-650 mg Oral Q6H PRN Nicholes Stairs, MD      . amLODipine (NORVASC) tablet 10 mg  10 mg Oral q morning - 10a Nicholes Stairs, MD   10 mg at 12/27/18 0831  . aspirin tablet 325 mg  325 mg Oral Daily Nicholes Stairs, MD   325 mg at 12/27/18 0830  . docusate sodium (COLACE) capsule 100 mg  100 mg Oral BID Nicholes Stairs, MD   100 mg at 12/27/18 0830  . gabapentin (NEURONTIN) capsule 300 mg  300 mg Oral TID Nicholes Stairs, MD   300 mg at 12/27/18 0830  . hydrALAZINE (APRESOLINE) tablet 25 mg  25 mg Oral Q8H Nicholes Stairs, MD   25 mg at 12/27/18 5374  . HYDROcodone-acetaminophen (NORCO) 7.5-325 MG per tablet 1-2 tablet  1-2 tablet Oral Q4H PRN Nicholes Stairs, MD      . HYDROcodone-acetaminophen (NORCO/VICODIN) 5-325 MG per tablet 1-2 tablet  1-2 tablet Oral Q4H PRN Nicholes Stairs, MD   1 tablet at 12/26/18 1422  . HYDROmorphone (DILAUDID) injection 0.5-1 mg  0.5-1 mg Intravenous Q4H PRN Nicholes Stairs, MD   1 mg at 12/25/18 8270  . insulin aspart (novoLOG) injection 0-9 Units  0-9 Units Subcutaneous TID WC Dessa Phi, DO   3 Units at 12/27/18 [phone]  . insulin glargine (LANTUS) injection 10 Units  10 Units Subcutaneous QHS Nicholes Stairs, MD   10 Units at 12/26/18 2133  . lactated ringers infusion   Intravenous Continuous Nicholes Stairs, MD      . levothyroxine Wilmer Floor, LEVOTHROID) tablet 25 mcg   25 mcg Oral Daily Nicholes Stairs, MD   25 mcg at 12/27/18 [phone]  . metoCLOPramide (REGLAN) tablet 5-10 mg  5-10 mg Oral Q8H PRN Nicholes Stairs, MD       Or  . metoCLOPramide [facility]) injection 5-10 mg  5-10 mg Intravenous Q8H PRN Nicholes Stairs, MD      . metoprolol tartrate (LOPRESSOR) tablet 25 mg  25 mg Oral BID Nicholes Stairs, MD   25 mg at 12/27/18 0831  . morphine 2 MG/ML injection 0.5-1 mg  0.5-1 mg Intravenous Q2H PRN Nicholes Stairs, MD      . naloxone [facility]) injection 0.4 mg  0.4 mg Intravenous PRN Nicholes Stairs, MD      . ondansetron [facility]) tablet 4 mg  4 mg Oral Q6H PRN Nicholes Stairs, MD       Or  . ondansetron [facility]) injection 4 mg  4 mg Intravenous Q6H PRN Nicholes Stairs, MD   4 mg at 12/25/18 1446  . oxybutynin (DITROPAN) tablet 5 mg  5 mg Oral q morning - 10a Nicholes Stairs, MD  5 mg at 12/27/18 0831  . traMADol (ULTRAM) tablet 50 mg  50 mg Oral Q6H PRN Nicholes Stairs, MD   50 mg at 12/25/18 1441     Discharge Medications: Please see discharge summary for a list of discharge medications.  Relevant Imaging Results:  Relevant Lab Results:   Additional Information SSN: [id number]  Alberteen Sam, LCSW

## 2018-12-27 NOTE — Plan of Care (Signed)
  Problem: Nutrition: Goal: Adequate nutrition will be maintained Outcome: Progressing   Problem: Pain Managment: Goal: General experience of comfort will improve Outcome: Progressing

## 2018-12-28 ENCOUNTER — Inpatient Hospital Stay (HOSPITAL_COMMUNITY): Payer: Medicare Other

## 2018-12-28 LAB — BLOOD GAS, ARTERIAL
Acid-Base Excess: 4.1 mmol/L — ABNORMAL HIGH (ref 0.0–2.0)
Bicarbonate: 28.4 mmol/L — ABNORMAL HIGH (ref 20.0–28.0)
Drawn by: 441371
O2 Content: 2 L/min
O2 Saturation: 97 %
Patient temperature: 98.6
pCO2 arterial: 44.7 mmHg (ref 32.0–48.0)
pH, Arterial: 7.419 (ref 7.350–7.450)
pO2, Arterial: 82.1 mmHg — ABNORMAL LOW (ref 83.0–108.0)

## 2018-12-28 LAB — URINALYSIS, ROUTINE W REFLEX MICROSCOPIC
Bilirubin Urine: NEGATIVE
Glucose, UA: 150 mg/dL — AB
Hgb urine dipstick: NEGATIVE
Ketones, ur: NEGATIVE mg/dL
Leukocytes, UA: NEGATIVE
Nitrite: NEGATIVE
Protein, ur: NEGATIVE mg/dL
Specific Gravity, Urine: 1.018 (ref 1.005–1.030)
pH: 5 (ref 5.0–8.0)

## 2018-12-28 LAB — GLUCOSE, CAPILLARY
Glucose-Capillary: 236 mg/dL — ABNORMAL HIGH (ref 70–99)
Glucose-Capillary: 237 mg/dL — ABNORMAL HIGH (ref 70–99)
Glucose-Capillary: 260 mg/dL — ABNORMAL HIGH (ref 70–99)
Glucose-Capillary: 255 mg/dL — ABNORMAL HIGH (ref 70–99)

## 2018-12-28 LAB — AMMONIA: Ammonia: 30 umol/L (ref 9–35)

## 2018-12-28 MED ORDER — POTASSIUM CHLORIDE IN NACL 20-0.9 MEQ/L-% IV SOLN
INTRAVENOUS | Status: DC
  Administered 2018-12-28 – 2018-12-29 (×2): via INTRAVENOUS
  Filled 2018-12-28 (×3): qty 1000

## 2018-12-28 MED ORDER — IPRATROPIUM-ALBUTEROL 0.5-2.5 (3) MG/3ML IN SOLN
3.0000 mL | Freq: Four times a day (QID) | RESPIRATORY_TRACT | Status: AC
  Administered 2018-12-28 – 2018-12-29 (×2): 3 mL via RESPIRATORY_TRACT
  Filled 2018-12-28 (×3): qty 3

## 2018-12-28 MED ORDER — GABAPENTIN 100 MG PO CAPS
100.0000 mg | ORAL_CAPSULE | Freq: Three times a day (TID) | ORAL | Status: DC
  Administered 2018-12-28 – 2018-12-30 (×7): 100 mg via ORAL
  Filled 2018-12-28 (×7): qty 1

## 2018-12-28 NOTE — Progress Notes (Signed)
PROGRESS NOTE    Margaret Dyer  AXE:[id number] DOB: 1935/02/01 DOA: 12/24/2018 PCP: Ann Held, DO   Brief Narrative:  Margaret Dyer is a 84 y.o. F with dCHF, HTN, NASH, remote RCC, CKD III, and DM who presented to the emergency room after mechanical fall on a concrete sidewalk outside her home.  Found to have left hip fracture on x-ray.  12/27/2018: Patient seen.  No new complaints.  Patient is awaiting disposition. 12/28/2018: Patient seen.  Patient's daughter was concerned about slurred speech.  Patient is on opiates.  Will minimize opiate use.  CT head is negative for acute process.  Ammonia is within normal range.  ABG did not reveal any significantly elevated PCO2.  Will decrease opiates, will discontinue IV opiates as well.  Will decrease gabapentin from 300 Mg p.o. 3 times daily to 100 Mg p.o. 3 times daily.  Chest x-ray is nonrevealing.  UA revealed specific gravity of 1.018.  Cautious hydration, considering grade 2 diastolic dysfunction as per echocardiogram done in 2018, but with normal EF.  Assessment & Plan: Left hip fracture Status post intramedullary intertrochanteric nail 1/22 No intraoperative complications.  Surgeon Dr. Stann Mainland. - Aspirin twice daily for DVT prophylaxis -PT evaluation 12/27/2018: Patient is stable for discharge.  Kindly pursue disposition. 12/28/2018: Orthopedic is managing postop  Tremor Patient and daughter noted tremor on the left hand to nursing today.  This was not present on my exam.  At the time of the tremor, the patient was alert and oriented, the tremor has been lasting since surgery per the patient, since prior to surgery per the daughter. 12/27/2018: Continue to monitor.  No tremors noted today.  Left leg pain Patient initially described this to me as "leg weakness".  On exam, left lower extremity strength appeared intact, but effort was limited by pain.  Patient able to stand with PT and appeared to have symmetric strength in  LEs.  Hypertension Chronic diastolic CHF Appears euvolemic -Continue amlodipine, metoprolol, hydralazine  Diabetes Glucose is well controlled -Continue Lantus -Continue sliding scale correction insulin -Continue gabapentin 12/27/2018: Continue to optimize.  Chronic kidney disease stage III Baseline creatinine 1.2 Continue to monitor.  Hypothyroidism -Continue levothyroxine  Other medications -Continue oxybutynin  Thrombocytopenia Normal at admission, dropped to 88K today. -Trend Platelets  Slurry speech/lethargy: See above. Volume depletion Gentle hydration Minimize opiates Work-up done so far is as documented above.    DVT prophylaxis: aspirin BID and SCDs Code Status: FULL Family Communication: None present  Consultants:   Orthopedics  Procedures:   Intramedullary Nailing left hip on 1/22  Antimicrobials:   None    Subjective: Patient seen alongside patient's daughter and nurse.. Lethargy reported.   Slurry speech also reported.    Objective: Vitals:   12/27/18 1300 12/27/18 2021 12/28/18 0347 12/28/18 0857  BP: (!) 118/54 (!) 146/55 (!) 152/54 (!) 146/61  Pulse: 67 78 76 84  Resp:      Temp: 98.8 F (37.1 C) 99.4 F (37.4 C) 98.9 F (37.2 C)   TempSrc: Oral Oral Oral   SpO2: 95% 92% 94%   Weight:      Height:        Intake/Output Summary (Last 24 hours) at 12/28/2018 1047 Last data filed at 12/28/2018 0930 Gross per 24 hour  Intake 780 ml  Output 1150 ml  Net -370 ml   Filed Weights   12/24/18 1905 12/25/18 1122  Weight: 86.2 kg 86 kg    Examination: General appearance:  adult  female, sleepy/drowsy but easily arousable HEENT: Mild pallor.  No jaundice.   Cardiac: S1-S2.  Respiratory: Normal respiratory rate and rhythm.  CTAB without rales or wheezes. Abdomen: Abdomen soft.  No TTP. No ascites, distension, hepatosplenomegaly.   Neuro: Awake and alert.    Data Reviewed: I have personally reviewed following labs and imaging  studies:  CBC: Recent Labs  Lab 12/24/18 2057 12/26/18 0522 12/27/18 0224  WBC 12.8* 9.8 9.6  NEUTROABS 11.0*  --   --   HGB 14.7 12.3 11.2*  HCT 44.6 38.2 33.7*  MCV 88.8 90.1 89.4  PLT 122* 85* 79*   Basic Metabolic Panel: Recent Labs  Lab 12/24/18 2057 12/26/18 0347 12/27/18 0224  NA 139 135 135  K 4.1 4.1 4.1  CL 104 101 102  CO2 _0 GLUCOSE 199* 182* 198*  BUN 30* 31* 36*  CREATININE 1.15* 1.23* 1.34*  CALCIUM 9.3 8.6* 8.3*   GFR: Estimated Creatinine Clearance: 34.6 mL/min (A) (by C-G formula based on SCr of 1.34 mg/dL (H)). Liver Function Tests: No results for input(s): AST, ALT, ALKPHOS, BILITOT, PROT, ALBUMIN in the last 168 hours. No results for input(s): LIPASE, AMYLASE in the last 168 hours. No results for input(s): AMMONIA in the last 168 hours. Coagulation Profile: No results for input(s): INR, PROTIME in the last 168 hours. Cardiac Enzymes: No results for input(s): CKTOTAL, CKMB, CKMBINDEX, TROPONINI in the last 168 hours. BNP (last 3 results) Recent Labs    05/03/18 1416  PROBNP 399   HbA1C: No results for input(s): HGBA1C in the last 72 hours. CBG: Recent Labs  Lab 12/27/18 0630 12/27/18 1157 12/27/18 1620 12/27/18 2018 12/28/18 0612  GLUCAP 208* 231* 219* 307* 236*   Lipid Profile: No results for input(s): CHOL, HDL, LDLCALC, TRIG, CHOLHDL, LDLDIRECT in the last 72 hours. Thyroid Function Tests: No results for input(s): TSH, T4TOTAL, FREET4, T3FREE, THYROIDAB in the last 72 hours. Anemia Panel: No results for input(s): VITAMINB12, FOLATE, FERRITIN, TIBC, IRON, RETICCTPCT in the last 72 hours. Urine analysis:    Component Value Date/Time   COLORURINE YELLOW 05/17/2017 1212   APPEARANCEUR CLEAR 05/17/2017 1212   LABSPEC 1.010 05/17/2017 1212   PHURINE 5.0 05/17/2017 1212   GLUCOSEU NEGATIVE 05/17/2017 1212   HGBUR NEGATIVE 05/17/2017 1212   HGBUR large 02/17/2011 1432   BILIRUBINUR neg 12/17/2017 1310   KETONESUR  NEGATIVE 05/17/2017 1212   PROTEINUR trace 12/17/2017 1310   PROTEINUR 100 (A) 05/17/2017 1212   UROBILINOGEN 0.2 12/17/2017 1310   UROBILINOGEN 0.2 02/17/2011 1432   NITRITE neg 12/17/2017 1310   NITRITE NEGATIVE 05/17/2017 1212   LEUKOCYTESUR Negative 12/17/2017 1310   Sepsis Labs: _1 (procalcitonin:4,lacticacidven:4)  )No results found for this or any previous visit (from the past 240 hour(s)).       Radiology Studies: No results found.      Scheduled Meds: . amLODipine  10 mg Oral q morning - 10a  . aspirin  325 mg Oral Daily  . docusate sodium  100 mg Oral BID  . gabapentin  300 mg Oral TID  . hydrALAZINE  25 mg Oral Q8H  . insulin aspart  0-9 Units Subcutaneous TID WC  . insulin glargine  10 Units Subcutaneous QHS  . levothyroxine  25 mcg Oral Daily  . metoprolol tartrate  25 mg Oral BID  . oxybutynin  5 mg Oral q morning - 10a   Continuous Infusions: . 0.9 % NaCl with KCl 20 mEq / L    .  lactated ringers       LOS: 4 days   Time spent: 25 minutes  Bonnell Public, MD [facility] 12/28/2018, 10:47 AM   Please page through AMION:  [email or web address] Password [email or web address] If 7PM-7AM, please contact night-coverage

## 2018-12-28 NOTE — Plan of Care (Signed)
  Problem: Education: Goal: Knowledge of General Education information will improve Description Including pain rating scale, medication(s)/side effects and non-pharmacologic comfort measures Outcome: Progressing   Problem: Clinical Measurements: Goal: Ability to maintain clinical measurements within normal limits will improve Outcome: Progressing   Problem: Activity: Goal: Risk for activity intolerance will decrease Outcome: Progressing   Problem: Elimination: Goal: Will not experience complications related to bowel motility Outcome: Progressing   Problem: Pain Managment: Goal: General experience of comfort will improve Outcome: Progressing   Problem: Safety: Goal: Ability to remain free from injury will improve Outcome: Progressing

## 2018-12-28 NOTE — Progress Notes (Signed)
   Subjective: 3 Days Post-Op Procedure(s) (LRB): INTRAMEDULLARY (IM) NAIL INTERTROCHANTRIC (Left)  Pt resting, arouses to voice Denies any new symptoms  Chart review shows some previous confusion Patient reports pain as mild.  Objective:   VITALS:   Vitals:   12/27/18 2021 12/28/18 0347  BP: (!) 146/55 (!) 152/54  Pulse: 78 76  Resp:    Temp: 99.4 F (37.4 C) 98.9 F (37.2 C)  SpO2: 92% 94%    Left hip incision healing well nv intact distally No rashes, minimal edema distally  LABS Recent Labs    12/26/18 0522 12/27/18 0224  HGB 12.3 11.2*  HCT 38.2 33.7*  WBC 9.8 9.6  PLT 85* 79*    Recent Labs    12/26/18 0347 12/27/18 0224  NA 135 135  K 4.1 4.1  BUN 31* 36*  CREATININE 1.23* 1.34*  GLUCOSE 182* 198*     Assessment/Plan: 3 Days Post-Op Procedure(s) (LRB): INTRAMEDULLARY (IM) NAIL INTERTROCHANTRIC (Left) Continue current treatment D/c planning Therapy as able and may weight bear as tolerated Will continue to monitor her progress    Merla Riches PA-C, [place] [facility] now [employer] [facility] [address], [place],  [address] Phone: [phone] [email or web address] Facebook  [employer]

## 2018-12-28 NOTE — Progress Notes (Signed)
Received call from patient's daughter concerning patient confusion. Daughter states that patient has increased confusion and she is worried that something is going on. She states that patient is alert and oriented at baseline. Informed daughter that patient is awake and alert and reports lethargy and not  feeling well for the last 2 days. She is easily aroused and able to follow commands. MD notified and order received for Head CT. Will cont to monitor.

## 2018-12-28 NOTE — Progress Notes (Signed)
Patient's daughter requests call from attending doctor and surgeon related to mother's current status in relation to test completed on today which includes CT, CXR, ABG's, UA, and ammonia levels.

## 2018-12-28 NOTE — Progress Notes (Signed)
Family concerned about patient having stroke.concerned about tremors patient has had prior to admission. Relating stroke to lethargic episode on 1/23 requiring Narcan. Family wanting MRI or CT scan. Educated family on narcan and pain medication. Verbalized understanding. Patient not displaying signs of stroke. Patient not having tremors. Patient resting in bed. Family to follow-up with daytime physicians. Information to be passed on during report. Will continue to monitor patient.

## 2018-12-29 LAB — RENAL FUNCTION PANEL
Albumin: 2.5 g/dL — ABNORMAL LOW (ref 3.5–5.0)
Anion gap: 10 (ref 5–15)
BUN: 39 mg/dL — ABNORMAL HIGH (ref 8–23)
CO2: 23 mmol/L (ref 22–32)
Calcium: 8.3 mg/dL — ABNORMAL LOW (ref 8.9–10.3)
Chloride: 104 mmol/L (ref 98–111)
Creatinine, Ser: 0.91 mg/dL (ref 0.44–1.00)
GFR calc Af Amer: >60 mL/min (ref >60)
GFR calc non Af Amer: 59 mL/min — ABNORMAL LOW (ref >60)
Glucose, Bld: 251 mg/dL — ABNORMAL HIGH (ref 70–99)
Phosphorus: 2.6 mg/dL (ref 2.5–4.6)
Potassium: 5 mmol/L (ref 3.5–5.1)
Sodium: 137 mmol/L (ref 135–145)

## 2018-12-29 LAB — GLUCOSE, CAPILLARY
Glucose-Capillary: 256 mg/dL — ABNORMAL HIGH (ref 70–99)
Glucose-Capillary: 268 mg/dL — ABNORMAL HIGH (ref 70–99)
Glucose-Capillary: 236 mg/dL — ABNORMAL HIGH (ref 70–99)
Glucose-Capillary: 254 mg/dL — ABNORMAL HIGH (ref 70–99)

## 2018-12-29 MED ORDER — IPRATROPIUM-ALBUTEROL 0.5-2.5 (3) MG/3ML IN SOLN: 3.0000 mL | RESPIRATORY_TRACT | Status: DC | PRN

## 2018-12-29 NOTE — Progress Notes (Signed)
Subjective: 4 Days Post-Op Procedure(s) (LRB): INTRAMEDULLARY (IM) NAIL INTERTROCHANTRIC (Left) Patient reports pain as moderate and severe.   Only able to take tylenol as all narcotics were causing confusion. Also could not tolerate tramadol. C/o pain L hip.  Objective: Vital signs in last 24 hours: Temp:  [98 F (36.7 C)-98.8 F (37.1 C)] 98 F (36.7 C) (01/26 0326) Pulse Rate:  [67-92] 67 (01/26 0326) Resp:  [16] 16 (01/25 1428) BP: (138-147)/(56-61) 147/56 (01/26 0326) SpO2:  [95 %-97 %] 97 % (01/26 0326)  Intake/Output from previous day: 01/25 0701 - 01/26 0700 In: 1370.7 [P.O.:720; I.V.:650.7] Out: 1900 [Urine:1900] Intake/Output this shift: No intake/output data recorded.  Recent Labs    12/27/18 0224  HGB 11.2*   Recent Labs    12/27/18 0224  WBC 9.6  RBC 3.77*  HCT 33.7*  PLT 79*   Recent Labs    12/27/18 0224 12/29/18 0506  NA 135 137  K 4.1 5.0  CL 102 104  CO2 26 23  BUN 36* 39*  CREATININE 1.34* 0.91  GLUCOSE 198* 251*  CALCIUM 8.3* 8.3*   No results for input(s): LABPT, INR in the last 72 hours.  Neurologically intact ABD soft Neurovascular intact Sensation intact distally Intact pulses distally Dorsiflexion/Plantar flexion intact Incision: dressing C/D/I and scant drainage No cellulitis present Compartment soft no sign of DVT, no calf pain   Assessment/Plan: 4 Days Post-Op Procedure(s) (LRB): INTRAMEDULLARY (IM) NAIL INTERTROCHANTRIC (Left) Advance diet Up with therapy D/C IV fluids Avoid narcotics due to confusion, Tylenol for pain WBAT LLE  ASA and SCDs for DVT ppx Stable from ortho standpoint   Cecilie Kicks 12/29/2018, 8:23 AM

## 2018-12-29 NOTE — Progress Notes (Signed)
RN notified MD per patients request that family is requesting nebulizer treatment be ordered as the medication has been discontinued.   RN went to bedside to assess patient. Currently on continuous pulse ox @ 97% on 3 liters of oxygen and pulse rate at 85 bpm. Patient alert and oriented times 4 without labored breathing.   RN spoke with patients daughter,Cheryl, after assessment related to updates of her mother's status. Daughter states that the nebulizer treatment was only ordered for 24 hours beginning on Saturday 12/28/2018 and ending on today 12/29/2018 and that she was ok with that. States that her mother sounds better than yesterday related to orientation and breathing. Her only concern now is speaking to the doctor in regards to when and where her mother will discharge to.  Patient is stable and resting in bed. RN encouraged family to call if there are any additional concerns. Nursing will continue to monitor.

## 2018-12-29 NOTE — Progress Notes (Signed)
RN notified MD per patients request that family is requesting nebulizer treatment be ordered as the medication has been discontinued. Nursing will continue to monitor.

## 2018-12-29 NOTE — Progress Notes (Signed)
PROGRESS NOTE    Margaret Dyer  WUX:[id number] DOB: 10/19/1935 DOA: 12/24/2018 PCP: Ann Held, DO   Brief Narrative:  Margaret Dyer is a 84 y.o. F with dCHF, HTN, NASH, remote RCC, CKD III, and DM who presented to the emergency room after mechanical fall on a concrete sidewalk outside her home.  Found to have left hip fracture on x-ray.  12/27/2018: Patient seen.  No new complaints.  Patient is awaiting disposition. 12/28/2018: Patient seen.  Patient's daughter was concerned about slurred speech.  Patient is on opiates.  Will minimize opiate use.  CT head is negative for acute process.  Ammonia is within normal range.  ABG did not reveal any significantly elevated PCO2.  Will decrease opiates, will discontinue IV opiates as well.  Will decrease gabapentin from 300 Mg p.o. 3 times daily to 100 Mg p.o. 3 times daily.  Chest x-ray is nonrevealing.  UA revealed specific gravity of 1.018.  Cautious hydration, considering grade 2 diastolic dysfunction as per echocardiogram done in 2018, but with normal EF. 12/29/2018: Patient seen.  Patient looks a lot better.  Opiates have been discontinued.  Limb depletion is resolving.  Patient was very communicative today.  Assessment & Plan: Left hip fracture Status post intramedullary intertrochanteric nail 1/22 No intraoperative complications.  Surgeon Dr. Stann Mainland. - Aspirin twice daily for DVT prophylaxis -PT evaluation 12/27/2018: Patient is stable for discharge.  Kindly pursue disposition. 12/28/2018: Orthopedic is managing postop  12/29/2018: Continue to hold opiates.  Tremor Patient and daughter noted tremor on the left hand to nursing today.  This was not present on my exam.  At the time of the tremor, the patient was alert and oriented, the tremor has been lasting since surgery per the patient, since prior to surgery per the daughter. 12/27/2018: Continue to monitor.  No tremors noted today.  Left leg pain Patient initially described this to me  as "leg weakness".  On exam, left lower extremity strength appeared intact, but effort was limited by pain.  Patient able to stand with PT and appeared to have symmetric strength in LEs.  Hypertension Chronic diastolic CHF Appears euvolemic -Continue amlodipine, metoprolol, hydralazine  Diabetes Glucose is well controlled -Continue Lantus -Continue sliding scale correction insulin -Continue gabapentin 12/27/2018: Continue to optimize.  Chronic kidney disease stage III Baseline creatinine 1.2 Continue to monitor.  Hypothyroidism -Continue levothyroxine  Other medications -Continue oxybutynin  Thrombocytopenia Normal at admission, dropped to 88K today. -Trend Platelets  Slurry speech/lethargy: See above. Volume depletion Gentle hydration Minimize opiates Work-up done so far is as documented above.  Volume depletion: Improving.    DVT prophylaxis: aspirin BID and SCDs Code Status: FULL Family Communication: None present  Consultants:   Orthopedics  Procedures:   Intramedullary Nailing left hip on 1/22  Antimicrobials:   None    Subjective: Patient seen  Patient looks a lot better today. Patient is more communicative.  Objective: Vitals:   12/28/18 2014 12/29/18 0326 12/29/18 0822 12/29/18 0932  BP: (!) 138/57 (!) 147/56 (!) 145/56   Pulse: 92 67 69   Resp:      Temp: 98.8 F (37.1 C) 98 F (36.7 C)    TempSrc: Oral Oral    SpO2: 97% 97%  97%  Weight:      Height:        Intake/Output Summary (Last 24 hours) at 12/29/2018 1058 Last data filed at 12/29/2018 0830 Gross per 24 hour  Intake 1370.74 ml  Output 1900 ml  Net -529.26 ml   Filed Weights   12/24/18 1905 12/25/18 1122  Weight: 86.2 kg 86 kg    Examination: General appearance:  adult female, patient is awake and alert. HEENT: Mild pallor.  No jaundice.   Cardiac: S1-S2.  Respiratory: Normal respiratory rate and rhythm.  CTAB without rales or wheezes. Abdomen: Abdomen soft.   No TTP. No ascites, distension, hepatosplenomegaly.   Neuro: Awake and alert.  Patient moves all limbs.  Data Reviewed: I have personally reviewed following labs and imaging studies:  CBC: Recent Labs  Lab 12/24/18 2057 12/26/18 0522 12/27/18 0224  WBC 12.8* 9.8 9.6  NEUTROABS 11.0*  --   --   HGB 14.7 12.3 11.2*  HCT 44.6 38.2 33.7*  MCV 88.8 90.1 89.4  PLT 122* 85* 79*   Basic Metabolic Panel: Recent Labs  Lab 12/24/18 2057 12/26/18 0347 12/27/18 0224 12/29/18 0506  NA 139 135 135 137  K 4.1 4.1 4.1 5.0  CL 104 101 102 104  CO2 _0 GLUCOSE 199* 182* 198* 251*  BUN 30* 31* 36* 39*  CREATININE 1.15* 1.23* 1.34* 0.91  CALCIUM 9.3 8.6* 8.3* 8.3*  PHOS  --   --   --  2.6   GFR: Estimated Creatinine Clearance: 51 mL/min (by C-G formula based on SCr of 0.91 mg/dL). Liver Function Tests: Recent Labs  Lab 12/29/18 0506  ALBUMIN 2.5*   No results for input(s): LIPASE, AMYLASE in the last 168 hours. Recent Labs  Lab 12/28/18 1500  AMMONIA 30   Coagulation Profile: No results for input(s): INR, PROTIME in the last 168 hours. Cardiac Enzymes: No results for input(s): CKTOTAL, CKMB, CKMBINDEX, TROPONINI in the last 168 hours. BNP (last 3 results) Recent Labs    05/03/18 1416  PROBNP 399   HbA1C: No results for input(s): HGBA1C in the last 72 hours. CBG: Recent Labs  Lab 12/28/18 0612 12/28/18 1106 12/28/18 1637 12/28/18 2106 12/29/18 0647  GLUCAP 236* 237* 260* 255* 256*   Lipid Profile: No results for input(s): CHOL, HDL, LDLCALC, TRIG, CHOLHDL, LDLDIRECT in the last 72 hours. Thyroid Function Tests: No results for input(s): TSH, T4TOTAL, FREET4, T3FREE, THYROIDAB in the last 72 hours. Anemia Panel: No results for input(s): VITAMINB12, FOLATE, FERRITIN, TIBC, IRON, RETICCTPCT in the last 72 hours. Urine analysis:    Component Value Date/Time   COLORURINE YELLOW 12/28/2018 1459   APPEARANCEUR CLEAR 12/28/2018 1459   LABSPEC 1.018  12/28/2018 1459   PHURINE 5.0 12/28/2018 1459   GLUCOSEU 150 (A) 12/28/2018 1459   HGBUR NEGATIVE 12/28/2018 1459   HGBUR large 02/17/2011 1432   BILIRUBINUR NEGATIVE 12/28/2018 1459   BILIRUBINUR neg 12/17/2017 1310   KETONESUR NEGATIVE 12/28/2018 1459   PROTEINUR NEGATIVE 12/28/2018 1459   UROBILINOGEN 0.2 12/17/2017 1310   UROBILINOGEN 0.2 02/17/2011 1432   NITRITE NEGATIVE 12/28/2018 1459   LEUKOCYTESUR NEGATIVE 12/28/2018 1459   Sepsis Labs: _1 (procalcitonin:4,lacticacidven:4)  )No results found for this or any previous visit (from the past 240 hour(s)).       Radiology Studies: Dg Chest 1 View  Result Date: 12/28/2018 CLINICAL DATA:  Hip fracture. Clinical concern for pneumonia. EXAM: CHEST  1 VIEW COMPARISON:  12/24/2018 and 12/03/2017. FINDINGS: Stable enlarged cardiac silhouette. No significant change in linear scarring in the left mid and lower lung zones. The right lung remains clear. Stable mild diffuse peribronchial thickening and accentuation of the interstitial markings. Diffuse osteopenia. IMPRESSION: No acute findings. Stable cardiomegaly, chronic bronchitic changes and left lung  scarring. Electronically Signed   By: Claudie Revering M.D.   On: 12/28/2018 12:24   Ct Head Wo Contrast  Result Date: 12/28/2018 CLINICAL DATA:  Encephalopathy EXAM: CT HEAD WITHOUT CONTRAST TECHNIQUE: Contiguous axial images were obtained from the base of the skull through the vertex without intravenous contrast. COMPARISON:  Brain MRI 07/19/2018 FINDINGS: Brain: No evidence of acute infarction, hemorrhage, hydrocephalus, extra-axial collection or mass lesion/mass effect. Mild for age cerebral volume loss. Remote lacunar infarct in the left thalamus Vascular: No hyperdense vessel or unexpected calcification. Skull: No acute finding. Sinuses/Orbits: Retention cyst in the right maxillary sinus. No acute finding. IMPRESSION: No acute finding or change from prior. Electronically Signed   By:  Monte Fantasia M.D.   On: 12/28/2018 11:42        Scheduled Meds: . amLODipine  10 mg Oral q morning - 10a  . aspirin  325 mg Oral Daily  . docusate sodium  100 mg Oral BID  . gabapentin  100 mg Oral TID  . hydrALAZINE  25 mg Oral Q8H  . insulin aspart  0-9 Units Subcutaneous TID WC  . insulin glargine  10 Units Subcutaneous QHS  . ipratropium-albuterol  3 mL Nebulization Q6H  . levothyroxine  25 mcg Oral Daily  . metoprolol tartrate  25 mg Oral BID  . oxybutynin  5 mg Oral q morning - 10a   Continuous Infusions: . 0.9 % NaCl with KCl 20 mEq / L 50 mL/hr at 12/29/18 0370  . lactated ringers       LOS: 5 days   Time spent: 25 minutes  Bonnell Public, MD [facility] 12/29/2018, 10:58 AM   Please page through AMION:  [email or web address] Password [email or web address] If 7PM-7AM, please contact night-coverage

## 2018-12-29 NOTE — Plan of Care (Signed)
  Problem: Education: Goal: Knowledge of General Education information will improve Description Including pain rating scale, medication(s)/side effects and non-pharmacologic comfort measures Outcome: Progressing   Problem: Clinical Measurements: Goal: Ability to maintain clinical measurements within normal limits will improve Outcome: Progressing   Problem: Activity: Goal: Risk for activity intolerance will decrease Outcome: Progressing   Problem: Elimination: Goal: Will not experience complications related to bowel motility Outcome: Progressing   Problem: Safety: Goal: Ability to remain free from injury will improve Outcome: Progressing   Problem: Skin Integrity: Goal: Risk for impaired skin integrity will decrease Outcome: Progressing

## 2018-12-30 DIAGNOSIS — M7989 Other specified soft tissue disorders: Secondary | ICD-10-CM | POA: Diagnosis not present

## 2018-12-30 DIAGNOSIS — R062 Wheezing: Secondary | ICD-10-CM | POA: Diagnosis not present

## 2018-12-30 DIAGNOSIS — S72002D Fracture of unspecified part of neck of left femur, subsequent encounter for closed fracture with routine healing: Secondary | ICD-10-CM | POA: Diagnosis not present

## 2018-12-30 DIAGNOSIS — K5909 Other constipation: Secondary | ICD-10-CM | POA: Diagnosis not present

## 2018-12-30 DIAGNOSIS — Z794 Long term (current) use of insulin: Secondary | ICD-10-CM | POA: Diagnosis not present

## 2018-12-30 DIAGNOSIS — N183 Chronic kidney disease, stage 3 (moderate): Secondary | ICD-10-CM | POA: Diagnosis not present

## 2018-12-30 DIAGNOSIS — Z4789 Encounter for other orthopedic aftercare: Secondary | ICD-10-CM | POA: Diagnosis not present

## 2018-12-30 DIAGNOSIS — R6889 Other general symptoms and signs: Secondary | ICD-10-CM | POA: Diagnosis not present

## 2018-12-30 DIAGNOSIS — I5032 Chronic diastolic (congestive) heart failure: Secondary | ICD-10-CM | POA: Diagnosis not present

## 2018-12-30 DIAGNOSIS — Z8701 Personal history of pneumonia (recurrent): Secondary | ICD-10-CM | POA: Diagnosis not present

## 2018-12-30 DIAGNOSIS — R609 Edema, unspecified: Secondary | ICD-10-CM | POA: Diagnosis not present

## 2018-12-30 DIAGNOSIS — J45909 Unspecified asthma, uncomplicated: Secondary | ICD-10-CM | POA: Diagnosis not present

## 2018-12-30 DIAGNOSIS — K5900 Constipation, unspecified: Secondary | ICD-10-CM | POA: Diagnosis not present

## 2018-12-30 DIAGNOSIS — W19XXXD Unspecified fall, subsequent encounter: Secondary | ICD-10-CM | POA: Diagnosis not present

## 2018-12-30 DIAGNOSIS — I10 Essential (primary) hypertension: Secondary | ICD-10-CM | POA: Diagnosis not present

## 2018-12-30 DIAGNOSIS — J9621 Acute and chronic respiratory failure with hypoxia: Secondary | ICD-10-CM | POA: Diagnosis not present

## 2018-12-30 DIAGNOSIS — S72002A Fracture of unspecified part of neck of left femur, initial encounter for closed fracture: Secondary | ICD-10-CM | POA: Diagnosis not present

## 2018-12-30 DIAGNOSIS — Z5189 Encounter for other specified aftercare: Secondary | ICD-10-CM | POA: Diagnosis not present

## 2018-12-30 DIAGNOSIS — R32 Unspecified urinary incontinence: Secondary | ICD-10-CM | POA: Diagnosis not present

## 2018-12-30 DIAGNOSIS — E119 Type 2 diabetes mellitus without complications: Secondary | ICD-10-CM | POA: Diagnosis not present

## 2018-12-30 DIAGNOSIS — K7581 Nonalcoholic steatohepatitis (NASH): Secondary | ICD-10-CM | POA: Diagnosis not present

## 2018-12-30 DIAGNOSIS — R0602 Shortness of breath: Secondary | ICD-10-CM | POA: Diagnosis not present

## 2018-12-30 DIAGNOSIS — I509 Heart failure, unspecified: Secondary | ICD-10-CM | POA: Diagnosis not present

## 2018-12-30 DIAGNOSIS — Z7982 Long term (current) use of aspirin: Secondary | ICD-10-CM | POA: Diagnosis not present

## 2018-12-30 DIAGNOSIS — I272 Pulmonary hypertension, unspecified: Secondary | ICD-10-CM | POA: Diagnosis not present

## 2018-12-30 DIAGNOSIS — E039 Hypothyroidism, unspecified: Secondary | ICD-10-CM | POA: Diagnosis not present

## 2018-12-30 DIAGNOSIS — K7460 Unspecified cirrhosis of liver: Secondary | ICD-10-CM | POA: Diagnosis not present

## 2018-12-30 DIAGNOSIS — E1151 Type 2 diabetes mellitus with diabetic peripheral angiopathy without gangrene: Secondary | ICD-10-CM | POA: Diagnosis not present

## 2018-12-30 DIAGNOSIS — M25561 Pain in right knee: Secondary | ICD-10-CM | POA: Diagnosis not present

## 2018-12-30 DIAGNOSIS — M25552 Pain in left hip: Secondary | ICD-10-CM | POA: Diagnosis not present

## 2018-12-30 DIAGNOSIS — M1711 Unilateral primary osteoarthritis, right knee: Secondary | ICD-10-CM | POA: Diagnosis not present

## 2018-12-30 DIAGNOSIS — G9009 Other idiopathic peripheral autonomic neuropathy: Secondary | ICD-10-CM | POA: Diagnosis not present

## 2018-12-30 DIAGNOSIS — E559 Vitamin D deficiency, unspecified: Secondary | ICD-10-CM | POA: Diagnosis not present

## 2018-12-30 DIAGNOSIS — C649 Malignant neoplasm of unspecified kidney, except renal pelvis: Secondary | ICD-10-CM | POA: Diagnosis not present

## 2018-12-30 DIAGNOSIS — R0609 Other forms of dyspnea: Secondary | ICD-10-CM | POA: Diagnosis not present

## 2018-12-30 DIAGNOSIS — M792 Neuralgia and neuritis, unspecified: Secondary | ICD-10-CM | POA: Diagnosis not present

## 2018-12-30 LAB — GLUCOSE, CAPILLARY
Glucose-Capillary: 246 mg/dL — ABNORMAL HIGH (ref 70–99)
Glucose-Capillary: 229 mg/dL — ABNORMAL HIGH (ref 70–99)
Glucose-Capillary: 229 mg/dL — ABNORMAL HIGH (ref 70–99)
Glucose-Capillary: 187 mg/dL — ABNORMAL HIGH (ref 70–99)

## 2018-12-30 MED ORDER — ASPIRIN 325 MG PO TABS: 325.0000 mg | ORAL_TABLET | Freq: Every day | ORAL | 0 refills | Status: DC

## 2018-12-30 MED ORDER — IPRATROPIUM-ALBUTEROL 0.5-2.5 (3) MG/3ML IN SOLN: 3.0000 mL | RESPIRATORY_TRACT | 0 refills | Status: DC | PRN

## 2018-12-30 MED ORDER — METOPROLOL TARTRATE 25 MG PO TABS: 25.0000 mg | ORAL_TABLET | Freq: Two times a day (BID) | ORAL | 0 refills | Status: DC

## 2018-12-30 MED ORDER — DOCUSATE SODIUM 100 MG PO CAPS: 100.0000 mg | ORAL_CAPSULE | Freq: Two times a day (BID) | ORAL | 0 refills | Status: AC

## 2018-12-30 MED ORDER — GABAPENTIN 100 MG PO CAPS: 100.0000 mg | ORAL_CAPSULE | Freq: Three times a day (TID) | ORAL | 0 refills | Status: DC

## 2018-12-30 MED ORDER — NALOXONE HCL 0.4 MG/ML IJ SOLN: 0.4000 mg | INTRAMUSCULAR | 0 refills | Status: DC | PRN

## 2018-12-30 MED ORDER — HYDRALAZINE HCL 25 MG PO TABS: 25.0000 mg | ORAL_TABLET | Freq: Three times a day (TID) | ORAL | 0 refills | Status: DC

## 2018-12-30 MED ORDER — INSULIN GLARGINE 100 UNIT/ML ~~LOC~~ SOLN: 10.0000 [IU] | Freq: Every day | SUBCUTANEOUS | 11 refills | Status: DC

## 2018-12-30 MED ORDER — TRAMADOL HCL 50 MG PO TABS
50.0000 mg | ORAL_TABLET | Freq: Four times a day (QID) | ORAL | Status: DC | PRN
  Administered 2018-12-30 (×2): 50 mg via ORAL
  Filled 2018-12-30 (×2): qty 1

## 2018-12-30 MED ORDER — TRAMADOL HCL 50 MG PO TABS: 50.0000 mg | ORAL_TABLET | Freq: Four times a day (QID) | ORAL | 0 refills | Status: DC | PRN

## 2018-12-30 NOTE — Progress Notes (Signed)
Patient discharging to [facility]. Report called in to [facility]. Awaiting Transportation.

## 2018-12-30 NOTE — Clinical Social Work Placement (Signed)
   CLINICAL SOCIAL WORK PLACEMENT  NOTE  Date:  12/30/2018  Patient Details  Name: Margaret Dyer MRN: [id number] Date of Birth: [birth year]  Clinical Social Work is seeking post-discharge placement for this patient at the [place] level of care (*CSW will initial, date and re-position this form in  chart as items are completed):      Patient/family provided with [facility] Work Department's list of facilities offering this level of care within the geographic area requested by the patient (or if unable, by the patient's family).      Patient/family informed of their freedom to choose among providers that offer the needed level of care, that participate in Medicare, Medicaid or managed care program needed by the patient, have an available bed and are willing to accept the patient.      Patient/family informed of [facility]'s ownership interest in [facility] and [facility], as well as of the fact that they are under no obligation to receive care at these facilities.  PASRR submitted to EDS on       PASRR number received on 12/27/18     Existing PASRR number confirmed on       FL2 transmitted to all facilities in geographic area requested by pt/family on 12/27/18     FL2 transmitted to all facilities within larger geographic area on       Patient informed that his/her managed care company has contracts with or will negotiate with certain facilities, including the following:        Yes   Patient/family informed of bed offers received.  Patient chooses bed at [place], [place]     Physician recommends and patient chooses bed at      Patient to be transferred to [facility] on 12/30/18.  Patient to be transferred to facility by [facility]     Patient family notified on 12/30/18 of transfer.  Name of family member notified:  [name] (daughter)     PHYSICIAN       Additional Comment:     _______________________________________________ Alberteen Sam, LCSW 12/30/2018, 2:50 PM

## 2018-12-30 NOTE — Discharge Summary (Signed)
Physician Discharge Summary  Patient ID: Margaret Dyer MRN: [id number] DOB/AGE: [birth year] 82 y.o.  Admit date: 12/24/2018 Discharge date: 12/30/2018  Admission Diagnoses:  Discharge Diagnoses:  Principal Problem:   Closed left hip fracture ([place]) Active Problems:   Hypertension   Diabetes mellitus type 2, insulin dependent (HCC)   Chronic diastolic CHF (congestive heart failure) (HCC)   CKD (chronic kidney disease), stage III [facility])   Discharged Condition: stable  Hospital Course: Margaret Dyer is an 49-year-old female with past medical history significant for diastolic congestive heart failure, hypertension, NASH, remote RCC, CKD III, and DM.  Patient presented to the emergency room after mechanical fall on a concrete sidewalk outside her home. Patient was found to have left hip fracture on x-ray.  Patient was admitted for further assessment and management.  Orthopedic team directed management of patient's left hip fracture.  Patient underwent left intramedullary nailing for the left hip fracture.  Postoperatively, patient was noted to develop lethargy easily following opiate use.  Kindly continue cautious use of opiates.  Patient will be discharged to skilled nursing facility for rehabilitation.  Left hip fracture: Status post intramedullary intertrochanteric nail on December 25, 2018.   No intraoperative complications.  Surgeon Dr. Stann Mainland. Aspirin for DVT prophylaxis Cautious use of opiates Patient be discharged to skilled nursing facility for rehab.  Tremor: No significant tremor noted in the last couple to few days preceding discharge.  Hypertension: Continue to monitor and optimize.  Chronic diastolic CHF Stable.   Continue to manage expectantly.    Diabetes mellitus: Glucose is well controlled -Continue Lantus -Continue to optimize.  Chronic kidney disease stage III Baseline creatinine 1.2 Continue to monitor.  Hypothyroidism -Continue  levothyroxine  Thrombocytopenia Continue to monitor closely.  Lethargy secondary to opiate use:  Cautious use of opiates.  Volume depletion: Resolved.  Consults: orthopedic surgery  Significant Diagnostic Studies:    Discharge Exam: Blood pressure (!) 144/70, pulse 78, temperature 99.8 F (37.7 C), temperature source Oral, resp. rate 16, height 5' 3.5" (1.613 m), weight 86 kg, SpO2 96 %.  Disposition: Discharge disposition: 03-Skilled Nursing Facility   Discharge Instructions    Diet - low sodium heart healthy   Complete by:  As directed    Increase activity slowly   Complete by:  As directed      Allergies as of 12/30/2018      Reactions   Other Other (See Comments)   Patient has hemorrhaged at least 4 times in her lifetime   Cefuroxime Axetil Nausea Only   Upset stomach   Ciprofloxacin Nausea Only   Upset stomach      Medication List    STOP taking these medications   aspirin EC 81 MG tablet Replaced by:  aspirin 325 MG tablet   furosemide 40 MG tablet Commonly known as:  LASIX   insulin glulisine 100 UNIT/ML injection Commonly known as:  APIDRA   potassium chloride SA 20 MEQ tablet Commonly known as:  K-DUR,KLOR-CON     TAKE these medications   amLODipine 10 MG tablet Commonly known as:  NORVASC TAKE 1 TABLET BY MOUTH EVERY MORNING What changed:  when to take this   aspirin 325 MG tablet Take 1 tablet (325 mg total) by mouth daily. Start taking on:  December 31, 2018 Replaces:  aspirin EC 81 MG tablet   docusate sodium 100 MG capsule Commonly known as:  COLACE Take 1 capsule (100 mg total) by mouth 2 (two) times daily. What changed:  when to  take this   gabapentin 100 MG capsule Commonly known as:  NEURONTIN Take 1 capsule (100 mg total) by mouth 3 (three) times daily.   hydrALAZINE 25 MG tablet Commonly known as:  APRESOLINE Take 1 tablet (25 mg total) by mouth every 8 (eight) hours. What changed:    medication strength  See the  new instructions.   insulin glargine 100 UNIT/ML injection Commonly known as:  LANTUS Inject 0.1 mLs (10 Units total) into the skin at bedtime. What changed:  how much to take   ipratropium-albuterol 0.5-2.5 (3) MG/3ML Soln Commonly known as:  DUONEB Take 3 mLs by nebulization every 4 (four) hours as needed.   levothyroxine 25 MCG tablet Commonly known as:  SYNTHROID, LEVOTHROID take 1 tablet by mouth daily What changed:    when to take this  additional instructions   metoprolol tartrate 25 MG tablet Commonly known as:  LOPRESSOR Take 1 tablet (25 mg total) by mouth 2 (two) times daily.   naloxone 0.4 MG/ML injection Commonly known as:  NARCAN Inject 1 mL (0.4 mg total) into the vein as needed.   oxybutynin 5 MG tablet Commonly known as:  DITROPAN TAKE 1 TABLET BY MOUTH EVERY MORNING   polyethylene glycol powder powder Commonly known as:  GLYCOLAX/MIRALAX Take 17 g by mouth daily as needed for mild constipation.   traMADol 50 MG tablet Commonly known as:  ULTRAM Take 1 tablet (50 mg total) by mouth every 6 (six) hours as needed for moderate pain. What changed:  See the new instructions.      Follow-up Information    Nicholes Stairs, MD In 2 weeks.   Specialty:  Orthopedic Surgery Why:  For suture removal, For wound re-check Contact information: [address] [place] [address] [phone]          Time spent: 32 minutes.  SignedBonnell Public 12/30/2018, 2:21 PM

## 2018-12-30 NOTE — Consult Note (Signed)
[facility] CM Inpatient Consult   12/30/2018  ROSHAWNA COLCLASURE 1935-01-27 [id number]    Patient screened for potential [facility] Care Management services due to unplanned readmission risk score of 22%, high.  Patient is in the [facility] with Medicare insurance plan.  Per chart review, patient disposition plan is for SNF.  No identifiable [facility] Care Management needs at this time.  Netta Cedars, MSN, [facility] Liaison Nurse Mobile Phone [phone]  Toll free office [phone]

## 2018-12-30 NOTE — Progress Notes (Signed)
Physical Therapy Treatment Patient Details Name: Margaret Dyer MRN: [id number] DOB: [birth year] Today's Date: 12/30/2018    History of Present Illness Patient is a 84 y/o female who presents sustained a left hip fracture, with intertrochanteric, pertrochanteric, subtrochanteric fracture with intramedullary implant. Found to have Acute on chronic diastolic CHF. Chest CT-Small dependent bilateral pleural effusions. s/p cardiac cath. PMH includes renal cell carcinoma, HTN, HLD, DM.    PT Comments    Continuing work on functional mobility and activity tolerance;  Pain is still limiting Margaret Dyer's activity tolerance; Margaret Jump, RN contacting MD about pain control regimen; was able to get up and OOB to the chair with +2 assist and use of the SaraStedy; and then able to participate in L hip therex; continue to agree with SNF for rehab   Follow Up Recommendations  SNF;Supervision/Assistance - 24 hour     Equipment Recommendations  Rolling walker with 5" wheels;3in1 (PT)    Recommendations for Other Services       Precautions / Restrictions Precautions Precautions: Fall Restrictions LLE Weight Bearing: Weight bearing as tolerated    Mobility  Bed Mobility Overal bed mobility: Needs Assistance Bed Mobility: Supine to Sit     Supine to sit: Max assist;+2 for physical assistance     General bed mobility comments: Cues to try to half-bridge to EOB with RLE, and use rails to pull to side and then push up; max assist to suport LLE coming off of the bed and to elevate trunk to fully upright sitting  Transfers Overall transfer level: Needs assistance Equipment used: 2 person hand held assist Transfers: Sit to/from Stand Sit to Stand: Mod assist;Max assist;+2 physical assistance         General transfer comment: Pt initially required max +2 from bed; sat back to bed unexpectedly at first stand, second stand from bed able to successfully get Denna Haggard support pads under her;  Once in stedy to stand from higher seated plates of stedy she required mod +2.    Ambulation/Gait                 Stairs             Wheelchair Mobility    Modified Rankin (Stroke Patients Only)       Balance     Sitting balance-Leahy Scale: Poor Sitting balance - Comments: Antalgic R lean     Standing balance-Leahy Scale: Poor Standing balance comment: Heavily reliant on UE support and external support.                              Cognition Arousal/Alertness: Awake/alert Behavior During Therapy: WFL for tasks assessed/performed Overall Cognitive Status: Impaired/Different from baseline Area of Impairment: Problem solving                             Problem Solving: Decreased initiation;Difficulty sequencing;Requires verbal cues;Requires tactile cues General Comments: Internall distracted by pain      Exercises General Exercises - Lower Extremity Gluteal Sets: AROM;Both;10 reps Long Arc Quad: AROM;Left;10 reps Heel Slides: Left;10 reps;AAROM;Supine Hip ABduction/ADduction: AAROM;Left;10 reps Other Exercises Other Exercises: Towel [id number]    General Comments        Pertinent Vitals/Pain Pain Assessment: Faces Faces Pain Scale: Hurts even more Pain Location: L hip; R mid to upper back pain Pain Descriptors / Indicators: Discomfort;Grimacing;Operative site guarding Pain Intervention(s): Monitored during session;Repositioned(heat  applied to back)    Home Living                      Prior Function            PT Goals (current goals can now be found in the care plan section) Acute Rehab PT Goals Patient Stated Goal: She is pointedly requesting more pain meds; RN aware PT Goal Formulation: With patient Time For Goal Achievement: 01/09/19 Potential to Achieve Goals: Fair Progress towards PT goals: Progressing toward goals(slowly)    Frequency    Min 3X/week      PT Plan Current plan remains  appropriate    Co-evaluation              AM-PAC PT "6 Clicks" Mobility   Outcome Measure  Help needed turning from your back to your side while in a flat bed without using bedrails?: A Lot Help needed moving from lying on your back to sitting on the side of a flat bed without using bedrails?: A Lot Help needed moving to and from a bed to a chair (including a wheelchair)?: A Lot Help needed standing up from a chair using your arms (e.g., wheelchair or bedside chair)?: A Lot Help needed to walk in hospital room?: Total Help needed climbing 3-5 steps with a railing? : Total 6 Click Score: 10    End of Session Equipment Utilized During Treatment: Gait belt Activity Tolerance: Patient tolerated treatment well;Patient limited by pain Patient left: in chair;with call bell/phone within reach;with chair alarm set Nurse Communication: Mobility status PT Visit Diagnosis: Other abnormalities of gait and mobility (R26.89);Pain Pain - Right/Left: Left Pain - part of body: Leg     Time: 3700-5259 PT Time Calculation (min) (ACUTE ONLY): 26 min  Charges:  $Therapeutic Exercise: 8-22 mins $Therapeutic Activity: 8-22 mins                     Roney Marion, PT  Acute Rehabilitation Services Pager [phone] Office [place] 12/30/2018, 12:44 PM

## 2018-12-30 NOTE — Progress Notes (Signed)
Patient c/o pain that's not relieved by PRN tylenol. Dr Stann Mainland notified and new order received to restart tramadol 70m po q 6hrs PRN.

## 2018-12-30 NOTE — Care Management Important Message (Signed)
Important Message  Patient Details  Name: Margaret Dyer MRN: [id number] Date of Birth: [birth year]   Medicare Important Message Given:  Yes    Tommy Medal 12/30/2018, 4:35 PM

## 2018-12-30 NOTE — Progress Notes (Signed)
Patient will DC to: [facility] Anticipated DC date: 12/30/2018 Family notified:[name] Transport XI:DHWY  Per MD patient ready for DC to [facility]. RN, patient, patient's family, and facility notified of DC. Discharge Summary sent to facility. RN given number for report [phone] Room 211. DC packet on chart. Ambulance transport requested for patient.  CSW signing off.  [place], [place]

## 2019-01-02 ENCOUNTER — Other Ambulatory Visit: Payer: Self-pay | Admitting: *Deleted

## 2019-01-03 NOTE — Patient Outreach (Signed)
[place] [facility]) Care Management  01/03/2019  Margaret Dyer January 07, 1935 [id number]  Facility site visit to [facility].  Collaboration with facility and [facility] UM team member.  Patient was admitted to facility on 12/30/18 after a brief hospitalization for hip fracture. PT reporting patient is walking 9 ft with rolling walker with max assistance.  PT reporting patient is max assist with adls, toileting and transfers.  Nursing reports patient stated she was not going to participate in therapy but then decided to after encouragement.  Discharge plan is to return home with daughter and have [place] ordered.  No discharge date set as of yet recent admit.   Went to bedside to speak with patient.  Patient pleasant but talking about how hard therapy was.  Patient stated she had been discouraged recently related to her spouse and grandson passed away within this year.  Patient stated her grandson had committed suicide and it has been very hard on her family.  Patient stated she lives with her daughter since her husband passed and it's just not working out.  Patient stated she and her daughter [name] and her daughter has a bad back and is not able to assist her.  Patient states she would be interested in going to an ALF if she can heal from her injury. Stated she was driving and independent prior to injury. Introduced Psychologist, forensic and gave patient [employer] with contact information included.  Asked patient if she would be interested in counseling through [place], patient stated she had attempted grief counseling through Hospice but they were full.  She stated she wasn't sure right now and requested to think about it.   Made facility PT and nursing staff about patient's admitting to her discouragement and and depression.   Plan to see patient at next facility visit.   Rutherford Limerick RN, BSN [place] Acute Care Coordinator [phone]) Business Mobile [phone]) Toll free office

## 2019-01-05 DIAGNOSIS — I10 Essential (primary) hypertension: Secondary | ICD-10-CM | POA: Diagnosis not present

## 2019-01-05 DIAGNOSIS — K5909 Other constipation: Secondary | ICD-10-CM | POA: Diagnosis not present

## 2019-01-05 DIAGNOSIS — J9621 Acute and chronic respiratory failure with hypoxia: Secondary | ICD-10-CM | POA: Diagnosis not present

## 2019-01-05 DIAGNOSIS — S72002D Fracture of unspecified part of neck of left femur, subsequent encounter for closed fracture with routine healing: Secondary | ICD-10-CM | POA: Diagnosis not present

## 2019-01-05 DIAGNOSIS — R0609 Other forms of dyspnea: Secondary | ICD-10-CM | POA: Diagnosis not present

## 2019-01-05 DIAGNOSIS — E1151 Type 2 diabetes mellitus with diabetic peripheral angiopathy without gangrene: Secondary | ICD-10-CM | POA: Diagnosis not present

## 2019-01-08 DIAGNOSIS — Z5189 Encounter for other specified aftercare: Secondary | ICD-10-CM | POA: Diagnosis not present

## 2019-01-08 DIAGNOSIS — M25552 Pain in left hip: Secondary | ICD-10-CM | POA: Diagnosis not present

## 2019-01-15 DIAGNOSIS — I509 Heart failure, unspecified: Secondary | ICD-10-CM | POA: Diagnosis not present

## 2019-01-15 DIAGNOSIS — M25561 Pain in right knee: Secondary | ICD-10-CM | POA: Diagnosis not present

## 2019-01-15 DIAGNOSIS — M1711 Unilateral primary osteoarthritis, right knee: Secondary | ICD-10-CM | POA: Diagnosis not present

## 2019-01-19 DIAGNOSIS — R0609 Other forms of dyspnea: Secondary | ICD-10-CM | POA: Diagnosis not present

## 2019-01-19 DIAGNOSIS — I10 Essential (primary) hypertension: Secondary | ICD-10-CM | POA: Diagnosis not present

## 2019-01-19 DIAGNOSIS — S72002D Fracture of unspecified part of neck of left femur, subsequent encounter for closed fracture with routine healing: Secondary | ICD-10-CM | POA: Diagnosis not present

## 2019-01-23 ENCOUNTER — Other Ambulatory Visit: Payer: Self-pay | Admitting: *Deleted

## 2019-01-23 NOTE — Patient Outreach (Signed)
[place] [facility]) Care Management  01/23/2019  ALAINAH PHANG [birth year] [id number]   Facility site visit to [facility] [place]. Collaboration with [facility] and [facility] UM RN concerning patient's progress, discharge plan and potential care management needs. Discharge planner stated patient's equipment including oxygen was being delivered today. PT stated patient has progressed well and was ambulating well with walker. Nursing reports patient's CHF has worsened, she was previously not on O2 at home but now is requiring O2 at 2 lpm via n/c. Patient admitted to SNF on 12/30/18 after a hospitalization for Left femur fracture after a fall. Planned discharge date is 01/25/19 and discharge disposition is home, daughter lives with patient.  Patient will have [place] at discharge and has been ordered a raised toilet and 02 for home.  Patient was evaluated for community based chronic disease management services with [facility] care Management Program as a benefit of patient's Next Gen  Medicare.   Went to patient's bedside to speak with patient and further assess for care management needs.  Patient was returning from PT, noted to ambulate with walker.   Patient states her main support person is her daughter who lives with her . Patient endorses her primary care provider to be [place] DO . Patient states transportation needs may be an issue related to her daughter has health issues too. Patient states medication management provided by herself.  Patient discussed medication cost is currently a barrier to care. Patient stated she was ordered an inhaler she was unable to pick up related cost. Patient has recently been put on 02 and inhaler may be beneficial.  Patient stated she was hoping to get off of 02 and was worried about the extra "cord" she was going to have to manage plus the walker.  Encouraged patient to be diligent with her incentive  spirometer.  [place] Management services again. Found [facility] Patient Packet with my contact information included I had given patient on previous visit.  Patient agreed to [place] services.  Patient gave her cell [phone] as the best number to reach her.  Referral placed for [place] to engage patient for transition of care and evaluate for monthly home visits.  Referral placed for [facility] SW to assist with transportation.  Referral placed for [facility] pharmacist to reach out related to high cost inhalers.  [facility] Care Management services do not interfere with or replace any services arranged by the facility discharge planner.   Plan to make [facility] UM team member aware [facility] will be following for care management.   For additional questions please contact:   Nuriya Stuck RN, [facility] Liaison  [phone]) Business Mobile [phone]) Toll free office

## 2019-01-26 DIAGNOSIS — I5032 Chronic diastolic (congestive) heart failure: Secondary | ICD-10-CM | POA: Diagnosis not present

## 2019-01-26 DIAGNOSIS — I130 Hypertensive heart and chronic kidney disease with heart failure and stage 1 through stage 4 chronic kidney disease, or unspecified chronic kidney disease: Secondary | ICD-10-CM | POA: Diagnosis not present

## 2019-01-26 DIAGNOSIS — Z8701 Personal history of pneumonia (recurrent): Secondary | ICD-10-CM | POA: Diagnosis not present

## 2019-01-26 DIAGNOSIS — J9621 Acute and chronic respiratory failure with hypoxia: Secondary | ICD-10-CM | POA: Diagnosis not present

## 2019-01-26 DIAGNOSIS — E039 Hypothyroidism, unspecified: Secondary | ICD-10-CM | POA: Diagnosis not present

## 2019-01-26 DIAGNOSIS — I272 Pulmonary hypertension, unspecified: Secondary | ICD-10-CM | POA: Diagnosis not present

## 2019-01-26 DIAGNOSIS — F419 Anxiety disorder, unspecified: Secondary | ICD-10-CM | POA: Diagnosis not present

## 2019-01-26 DIAGNOSIS — I50810 Right heart failure, unspecified: Secondary | ICD-10-CM | POA: Diagnosis not present

## 2019-01-26 DIAGNOSIS — S72042D Displaced fracture of base of neck of left femur, subsequent encounter for closed fracture with routine healing: Secondary | ICD-10-CM | POA: Diagnosis not present

## 2019-01-26 DIAGNOSIS — E1122 Type 2 diabetes mellitus with diabetic chronic kidney disease: Secondary | ICD-10-CM | POA: Diagnosis not present

## 2019-01-26 DIAGNOSIS — E1142 Type 2 diabetes mellitus with diabetic polyneuropathy: Secondary | ICD-10-CM | POA: Diagnosis not present

## 2019-01-26 DIAGNOSIS — E559 Vitamin D deficiency, unspecified: Secondary | ICD-10-CM | POA: Diagnosis not present

## 2019-01-26 DIAGNOSIS — Z9981 Dependence on supplemental oxygen: Secondary | ICD-10-CM | POA: Diagnosis not present

## 2019-01-26 DIAGNOSIS — Z794 Long term (current) use of insulin: Secondary | ICD-10-CM | POA: Diagnosis not present

## 2019-01-26 DIAGNOSIS — Z8553 Personal history of malignant neoplasm of renal pelvis: Secondary | ICD-10-CM | POA: Diagnosis not present

## 2019-01-26 DIAGNOSIS — N183 Chronic kidney disease, stage 3 (moderate): Secondary | ICD-10-CM | POA: Diagnosis not present

## 2019-01-26 DIAGNOSIS — Z9181 History of falling: Secondary | ICD-10-CM | POA: Diagnosis not present

## 2019-01-26 DIAGNOSIS — Z7982 Long term (current) use of aspirin: Secondary | ICD-10-CM | POA: Diagnosis not present

## 2019-01-26 DIAGNOSIS — M1030 Gout due to renal impairment, unspecified site: Secondary | ICD-10-CM | POA: Diagnosis not present

## 2019-01-26 DIAGNOSIS — W19XXXD Unspecified fall, subsequent encounter: Secondary | ICD-10-CM | POA: Diagnosis not present

## 2019-01-26 DIAGNOSIS — E538 Deficiency of other specified B group vitamins: Secondary | ICD-10-CM | POA: Diagnosis not present

## 2019-01-26 DIAGNOSIS — Z905 Acquired absence of kidney: Secondary | ICD-10-CM | POA: Diagnosis not present

## 2019-01-26 DIAGNOSIS — K7581 Nonalcoholic steatohepatitis (NASH): Secondary | ICD-10-CM | POA: Diagnosis not present

## 2019-01-27 ENCOUNTER — Other Ambulatory Visit: Payer: Self-pay

## 2019-01-27 ENCOUNTER — Telehealth: Payer: Self-pay | Admitting: Family Medicine

## 2019-01-27 DIAGNOSIS — I130 Hypertensive heart and chronic kidney disease with heart failure and stage 1 through stage 4 chronic kidney disease, or unspecified chronic kidney disease: Secondary | ICD-10-CM | POA: Diagnosis not present

## 2019-01-27 DIAGNOSIS — S72042D Displaced fracture of base of neck of left femur, subsequent encounter for closed fracture with routine healing: Secondary | ICD-10-CM | POA: Diagnosis not present

## 2019-01-27 DIAGNOSIS — E1122 Type 2 diabetes mellitus with diabetic chronic kidney disease: Secondary | ICD-10-CM | POA: Diagnosis not present

## 2019-01-27 DIAGNOSIS — I5032 Chronic diastolic (congestive) heart failure: Secondary | ICD-10-CM | POA: Diagnosis not present

## 2019-01-27 DIAGNOSIS — N183 Chronic kidney disease, stage 3 (moderate): Secondary | ICD-10-CM | POA: Diagnosis not present

## 2019-01-27 DIAGNOSIS — I50810 Right heart failure, unspecified: Secondary | ICD-10-CM | POA: Diagnosis not present

## 2019-01-27 NOTE — Patient Outreach (Signed)
[facility]) Care Management  01/27/2019  Margaret Dyer October 20, 1935 [id number]   Unsuccessful outreach to patient regarding social work referral for transportation resources.  BSW left voicemail message and mailed Unsuccessful Outreach Letter.  Will attempt to reach her again within four business days.  Ronn Melena, BSW Social Worker [phone]

## 2019-01-27 NOTE — Telephone Encounter (Signed)
Copied from [place] [phone]. Topic: Quick Communication - Home Health Verbal Orders >> Jan 27, 2019  3:30 PM Vernona Rieger wrote: Caller/Agency: Langley Gauss, RN with [facility] Number: [phone] Requesting OT/PT/Skilled Nursing/Social Work: Nursing, Physical Therapy & Occupational Therapy Frequency: once a week for three weeks , physical therapy and occupational therapy will call for their own frequency

## 2019-01-28 ENCOUNTER — Other Ambulatory Visit: Payer: Self-pay | Admitting: Pharmacist

## 2019-01-28 ENCOUNTER — Other Ambulatory Visit: Payer: Self-pay

## 2019-01-28 ENCOUNTER — Telehealth: Payer: Self-pay | Admitting: *Deleted

## 2019-01-28 ENCOUNTER — Telehealth: Payer: Self-pay | Admitting: Family Medicine

## 2019-01-28 NOTE — Patient Outreach (Signed)
[facility]) Care Management  [place]  01/28/2019  NOELLE SEASE [birth year] [id number]   Reason for call: Medication Assistance  Unsuccessful telephone call attempt #1 to patient.   Patient states she is in bed and requests that she call me later today to review medications.    Plan:  I will await call back from patient.   I will make another outreach attempt to patient within 3-4 business days if I have not heard back yet.  Noted another [facility] discipline has already mailed unsuccessful outreach letter.   Ralene Bathe, PharmD, [place] [phone]

## 2019-01-28 NOTE — Telephone Encounter (Signed)
appt made

## 2019-01-28 NOTE — Telephone Encounter (Unsigned)
Copied from [place] [phone]. Topic: Quick Communication - Home Health Verbal Orders >> Jan 28, 2019  2:41 PM Yvette Rack wrote: Caller/Agency: Caprice Red with [facility] Number: [phone] Requesting OT/PT/Skilled Nursing/Social Work: OT Frequency: 1 time week for 2 weeks, 0 times a week for 1 week, and 1 time a week for 1 week for instructions and diabetic foot care non-pharmacological methods of pain control IADL ADL Transverse exercise

## 2019-01-28 NOTE — Patient Outreach (Signed)
Telephone assessment:  New referral:  Discharged from [facility] after hip fracture.  Placed call to patient who identified herself. Patient reports that she is doing better than she thought she would at home. Lives with her daughter.  Patient reports her biggest concern right now is transportation to Md appointments. Patient reports that her daughter is planning to take her to see her primary MD on 01/31/2019.  Patient reports that [place] home health nurse,  PT and OT have seen her.  Reports she is walking and doing her home exercises. Patient has concerns about transportation. Reviewed with patient [facility] social worker is trying to reach her.  Reviewed [facility] pharmacy is calling as well. Patient reports that she will return calls.  Offered home visit to assess needs and patient request call back on 02/03/2019.   MD office does transition of care.  PLAN: Will call patient back on 02/03/2019. Will attempt to schedule home visit.  Tomasa Rand, RN, BSN, CEN [facility] [employer] [phone]

## 2019-01-28 NOTE — Telephone Encounter (Signed)
Copied from [place] [phone]. Topic: Appointment Scheduling - Scheduling Inquiry for Clinic >> Jan 27, 2019  1:31 PM Margot Ables wrote: Reason for CRM: pt called regarding hosp f/u 2/28 - she cannot come at 11:30 because her daughter has to bring her and is not able to until late afternoon. Change appt to 3:30. Please advise

## 2019-01-28 NOTE — Telephone Encounter (Signed)
Verbal order given to [facility]

## 2019-01-29 ENCOUNTER — Ambulatory Visit: Payer: Self-pay

## 2019-01-29 ENCOUNTER — Other Ambulatory Visit: Payer: Self-pay

## 2019-01-29 DIAGNOSIS — N183 Chronic kidney disease, stage 3 (moderate): Secondary | ICD-10-CM | POA: Diagnosis not present

## 2019-01-29 DIAGNOSIS — S72042D Displaced fracture of base of neck of left femur, subsequent encounter for closed fracture with routine healing: Secondary | ICD-10-CM | POA: Diagnosis not present

## 2019-01-29 DIAGNOSIS — I130 Hypertensive heart and chronic kidney disease with heart failure and stage 1 through stage 4 chronic kidney disease, or unspecified chronic kidney disease: Secondary | ICD-10-CM | POA: Diagnosis not present

## 2019-01-29 DIAGNOSIS — I50810 Right heart failure, unspecified: Secondary | ICD-10-CM | POA: Diagnosis not present

## 2019-01-29 DIAGNOSIS — E1122 Type 2 diabetes mellitus with diabetic chronic kidney disease: Secondary | ICD-10-CM | POA: Diagnosis not present

## 2019-01-29 DIAGNOSIS — I5032 Chronic diastolic (congestive) heart failure: Secondary | ICD-10-CM | POA: Diagnosis not present

## 2019-01-29 NOTE — Patient Outreach (Signed)
[facility]) Care Management  01/29/2019  Margaret Dyer [birth year] [id number]   Second unsuccessful outreach to [name] regarding social work referral for transportation resources.  BSW left voicemail message.  Unsuccessful outreach letter mailed on 01/27/19.  BSW will attempt to reach again within four business days.  Ronn Melena, BSW Social Worker [phone]

## 2019-01-30 ENCOUNTER — Other Ambulatory Visit: Payer: Self-pay

## 2019-01-30 DIAGNOSIS — I5032 Chronic diastolic (congestive) heart failure: Secondary | ICD-10-CM | POA: Diagnosis not present

## 2019-01-30 DIAGNOSIS — N183 Chronic kidney disease, stage 3 (moderate): Secondary | ICD-10-CM | POA: Diagnosis not present

## 2019-01-30 DIAGNOSIS — S72042D Displaced fracture of base of neck of left femur, subsequent encounter for closed fracture with routine healing: Secondary | ICD-10-CM | POA: Diagnosis not present

## 2019-01-30 DIAGNOSIS — I50810 Right heart failure, unspecified: Secondary | ICD-10-CM | POA: Diagnosis not present

## 2019-01-30 DIAGNOSIS — E1122 Type 2 diabetes mellitus with diabetic chronic kidney disease: Secondary | ICD-10-CM | POA: Diagnosis not present

## 2019-01-30 DIAGNOSIS — I130 Hypertensive heart and chronic kidney disease with heart failure and stage 1 through stage 4 chronic kidney disease, or unspecified chronic kidney disease: Secondary | ICD-10-CM | POA: Diagnosis not present

## 2019-01-30 NOTE — Patient Outreach (Signed)
[facility]) Care Management  01/30/2019  AYERIM BERQUIST 1935/02/07 [id number]   Successful outreach to patient regarding social work referral for transportation resources.  Patient has an appointment with her primary MD tomorrow and reported that her daughters are providing transportation.  BSW briefly talked with patient about [employer] and [employer] as well as Armed forces technical officer.  She requested a call back on Monday, 02/03/19 to further discuss these services.  She did inquire about whether or not [facility] will provide "door to door" service and assist her with getting down the steps at her home.  BSW will contact Callie Fielding at [facility] to inquire.   BSW will follow up with patient on Monday as requested.  Ronn Melena, BSW Social Worker [phone]

## 2019-01-31 ENCOUNTER — Inpatient Hospital Stay: Payer: Medicare Other | Admitting: Family Medicine

## 2019-01-31 ENCOUNTER — Ambulatory Visit (INDEPENDENT_AMBULATORY_CARE_PROVIDER_SITE_OTHER): Payer: Medicare Other | Admitting: Family Medicine

## 2019-01-31 ENCOUNTER — Ambulatory Visit: Payer: Self-pay | Admitting: Pharmacist

## 2019-01-31 ENCOUNTER — Ambulatory Visit (HOSPITAL_BASED_OUTPATIENT_CLINIC_OR_DEPARTMENT_OTHER)
Admission: RE | Admit: 2019-01-31 | Discharge: 2019-01-31 | Disposition: A | Discharge status: Home / Self Care | Payer: Medicare Other | Source: Ambulatory Visit | Attending: Family Medicine | Admitting: Family Medicine

## 2019-01-31 ENCOUNTER — Encounter: Payer: Self-pay | Admitting: Family Medicine

## 2019-01-31 ENCOUNTER — Other Ambulatory Visit: Payer: Self-pay | Admitting: Pharmacist

## 2019-01-31 VITALS — BP 138/78 | HR 68 | Resp 12 | Ht 63.5 in | Wt 185.0 lb

## 2019-01-31 DIAGNOSIS — M79605 Pain in left leg: Secondary | ICD-10-CM | POA: Diagnosis not present

## 2019-01-31 DIAGNOSIS — N183 Chronic kidney disease, stage 3 unspecified: Secondary | ICD-10-CM

## 2019-01-31 DIAGNOSIS — G894 Chronic pain syndrome: Secondary | ICD-10-CM | POA: Diagnosis not present

## 2019-01-31 DIAGNOSIS — I10 Essential (primary) hypertension: Secondary | ICD-10-CM

## 2019-01-31 DIAGNOSIS — Z794 Long term (current) use of insulin: Secondary | ICD-10-CM

## 2019-01-31 DIAGNOSIS — R300 Dysuria: Secondary | ICD-10-CM | POA: Diagnosis not present

## 2019-01-31 DIAGNOSIS — E785 Hyperlipidemia, unspecified: Secondary | ICD-10-CM

## 2019-01-31 DIAGNOSIS — I159 Secondary hypertension, unspecified: Secondary | ICD-10-CM

## 2019-01-31 DIAGNOSIS — S72002A Fracture of unspecified part of neck of left femur, initial encounter for closed fracture: Secondary | ICD-10-CM

## 2019-01-31 DIAGNOSIS — E1165 Type 2 diabetes mellitus with hyperglycemia: Secondary | ICD-10-CM

## 2019-01-31 DIAGNOSIS — E1169 Type 2 diabetes mellitus with other specified complication: Secondary | ICD-10-CM

## 2019-01-31 DIAGNOSIS — E039 Hypothyroidism, unspecified: Secondary | ICD-10-CM

## 2019-01-31 DIAGNOSIS — E119 Type 2 diabetes mellitus without complications: Secondary | ICD-10-CM

## 2019-01-31 DIAGNOSIS — M7989 Other specified soft tissue disorders: Secondary | ICD-10-CM | POA: Diagnosis not present

## 2019-01-31 LAB — POCT URINALYSIS DIPSTICK
Bilirubin, UA: NEGATIVE
Blood, UA: NEGATIVE
Glucose, UA: NEGATIVE
Ketones, UA: NEGATIVE
Leukocytes, UA: NEGATIVE
Nitrite, UA: NEGATIVE
Protein, UA: NEGATIVE
SPEC GRAV UA: 1.015 (ref 1.010–1.025)
Urobilinogen, UA: 0.2 E.U./dL
pH, UA: 6.5 (ref 5.0–8.0)

## 2019-01-31 MED ORDER — TRAMADOL HCL 50 MG PO TABS: 50.0000 mg | ORAL_TABLET | Freq: Four times a day (QID) | ORAL | 0 refills | Status: DC | PRN

## 2019-01-31 NOTE — Patient Outreach (Signed)
[place] [facility]) Care Management  [place]  01/31/2019  Margaret Dyer May 25, 1935 [id number]   Reason for call: Medication Assistance  Unsuccessful telephone call attempt #2 to patient.   HIPAA compliant voicemail left requesting a return call  Plan:  I will make another outreach attempt to patient within 3-4 business days   Ralene Bathe, PharmD, [place] [phone]

## 2019-01-31 NOTE — Progress Notes (Signed)
Patient ID: Margaret Dyer, female    DOB: [birth year]  Age: 84 y.o. MRN: [id number]    Subjective:  Subjective  HPI Margaret Dyer presents for f/u pain meds , bp and labs    No complaints.  She is recovering from a fall / fracture L hip.  Review of Systems  Constitutional: Negative for activity change, appetite change, fatigue and unexpected weight change.  Respiratory: Negative for cough and shortness of breath.   Cardiovascular: Negative for chest pain and palpitations.  Musculoskeletal: Positive for arthralgias and gait problem.  Psychiatric/Behavioral: Negative for behavioral problems and dysphoric mood. The patient is not nervous/anxious.     History Past Medical History:  Diagnosis Date  . Asthma   . Blood transfusion   . Chronic diastolic CHF (congestive heart failure) ([place])   . CKD (chronic kidney disease), stage III ([place])   . Coronary artery calcification seen on CT scan   . Diabetes mellitus   . Goiter   . Hyperlipidemia   . Hypertension   . Hypothyroidism   . Liver cirrhosis ([place])   . NASH (nonalcoholic steatohepatitis)   . Obesity   . Osteopenia   . Pulmonary hypertension ([place])   . Renal cell carcinoma    a. prior nephrectomy.  . Right heart failure ([place])   . Thrombocytopenia ([place])     She has a past surgical history that includes Nephrectomy (09.2000); Appendectomy; Abdominal hysterectomy; Knee surgery; Hemorrhoid surgery; and Intramedullary (im) nail intertrochanteric (Left, 12/25/2018).   Her family history includes COPD in her father; Cancer in her father and sister; Congestive Heart Failure in her father; Coronary artery disease in an other family member; Diabetes in an other family member; Hyperlipidemia in an other family member; Hypertension in an other family member; Kidney cancer in her father.She reports that she has quit smoking. She has never used smokeless tobacco. She reports that she does not drink alcohol or use drugs.  Current Outpatient  Medications on File Prior to Visit  Medication Sig Dispense Refill  . aspirin 325 MG tablet Take 1 tablet (325 mg total) by mouth daily. 30 tablet 0  . docusate sodium (COLACE) 100 MG capsule Take 1 capsule (100 mg total) by mouth 2 (two) times daily. 10 capsule 0  . gabapentin (NEURONTIN) 100 MG capsule Take 1 capsule (100 mg total) by mouth 3 (three) times daily. 90 capsule 0  . hydrALAZINE (APRESOLINE) 25 MG tablet Take 1 tablet (25 mg total) by mouth every 8 (eight) hours. (Patient taking differently: Take 25 mg by mouth every 8 (eight) hours. Take 1/2 tablet) 90 tablet 0  . insulin glargine (LANTUS) 100 UNIT/ML injection Inject 0.1 mLs (10 Units total) into the skin at bedtime. 10 mL 11  . ipratropium-albuterol (DUONEB) 0.5-2.5 (3) MG/3ML SOLN Take 3 mLs by nebulization every 4 (four) hours as needed. 360 mL 0  . levothyroxine (SYNTHROID, LEVOTHROID) 25 MCG tablet take 1 tablet by mouth daily (Patient taking differently: Take 25 mcg by mouth See admin instructions. Take 25 mcg by mouth before breakfast on Mon/Tues/Wed/Thurs/Fri/Sat) 30 tablet 5  . metoprolol tartrate (LOPRESSOR) 25 MG tablet Take 1 tablet (25 mg total) by mouth 2 (two) times daily. 60 tablet 0  . naloxone (NARCAN) 0.4 MG/ML injection Inject 1 mL (0.4 mg total) into the vein as needed. 1 mL 0  . oxybutynin (DITROPAN) 5 MG tablet TAKE 1 TABLET BY MOUTH EVERY MORNING (Patient taking differently: Take 5 mg by mouth every morning. ) 90 tablet  1  . polyethylene glycol powder (GLYCOLAX/MIRALAX) powder Take 17 g by mouth daily as needed for mild constipation.     No current facility-administered medications on file prior to visit.      Objective:  Objective  Physical Exam Vitals signs and nursing note reviewed.  Constitutional:      Appearance: She is well-developed.  HENT:     Head: Normocephalic and atraumatic.  Eyes:     Conjunctiva/sclera: Conjunctivae normal.  Neck:     Musculoskeletal: Normal range of motion and neck  supple.     Thyroid: No thyromegaly.     Vascular: No carotid bruit or JVD.  Cardiovascular:     Rate and Rhythm: Normal rate and regular rhythm.     Heart sounds: Normal heart sounds. No murmur.  Pulmonary:     Effort: Pulmonary effort is normal. No respiratory distress.     Breath sounds: Normal breath sounds. No wheezing or rales.  Chest:     Chest wall: No tenderness.  Musculoskeletal:     Comments: Pt in a wheelchair in office Walks with walker at home and is getting ot/ pt at home  Neurological:     Mental Status: She is alert and oriented to person, place, and time.    BP 138/78   Pulse 68   Resp 12   Ht 5' 3.5" (1.613 m)   Wt 185 lb (83.9 kg)   LMP  (LMP Unknown)   SpO2 98%   BMI 32.26 kg/m  Wt Readings from Last 3 Encounters:  01/31/19 185 lb (83.9 kg)  12/25/18 189 lb 9.5 oz (86 kg)  07/05/18 194 lb (88 kg)     Lab Results  Component Value Date   WBC 9.6 12/27/2018   HGB 11.2 (L) 12/27/2018   HCT 33.7 (L) 12/27/2018   PLT 79 (L) 12/27/2018   GLUCOSE 251 (H) 12/29/2018   CHOL 160 05/28/2017   TRIG 145.0 05/28/2017   HDL 37.30 (L) 05/28/2017   LDLDIRECT 89.0 01/19/2015   LDLCALC 94 05/28/2017   ALT 24 12/17/2017   AST 24 12/17/2017   NA 137 12/29/2018   K 5.0 12/29/2018   CL 104 12/29/2018   CREATININE 0.91 12/29/2018   BUN 39 (H) 12/29/2018   CO2 23 12/29/2018   TSH 1.328 05/13/2017   INR 1.17 05/18/2017   HGBA1C 8.8 (H) 05/13/2017   MICROALBUR 52.5 (H) 05/28/2017    Dg Chest 1 View  Result Date: 12/24/2018 CLINICAL DATA:  Status post fall. EXAM: CHEST  1 VIEW COMPARISON:  12/03/2017 FINDINGS: Enlarged cardiac silhouette. Calcific atherosclerotic disease of the aorta. There is no evidence of focal airspace consolidation, pleural effusion or pneumothorax. Stable left lung scarring. Osseous structures are without acute abnormality. Soft tissues are grossly normal. IMPRESSION: Enlarged cardiac silhouette. Calcific atherosclerotic disease of the  aorta. Electronically Signed   By: Fidela Salisbury M.D.   On: 12/24/2018 20:31   Dg Knee 1-2 Views Left  Result Date: 12/25/2018 CLINICAL DATA:  84 year old female with a history of fall and pain EXAM: LEFT KNEE - 1-2 VIEW COMPARISON:  None. FINDINGS: No acute displaced fracture. Mild joint space narrowing of the mediolateral compartment. Evidence of chondrocalcinosis. Degenerative changes of the patellofemoral joint. No joint effusion. Osteopenia. Vascular calcifications. IMPRESSION: No acute bony abnormality. Tricompartmental osteoarthritis. Chondrocalcinosis Atherosclerosis Electronically Signed   By: Corrie Mckusick D.O.   On: 12/25/2018 10:39   Dg C-arm 1-60 Min  Result Date: 12/25/2018 CLINICAL DATA:  Hip fracture repair FLUOROSCOPY  TIME:  50 seconds. Images: 4 EXAM: DG C-ARM 61-120 MIN COMPARISON:  December 24, 2018 FINDINGS: Patient is status post left hip fracture repair. A gamma nail and intramedullary femoral rod are identified. A distal interlocking screw is noted. Hardware is in good position. IMPRESSION: Left hip fracture repair as above. Electronically Signed   By: Dorise Bullion III M.D   On: 12/25/2018 13:28   Dg Hip Unilat With Pelvis 2-3 Views Left  Result Date: 12/24/2018 CLINICAL DATA:  Fall.  Hip pain EXAM: DG HIP (WITH OR WITHOUT PELVIS) 2-3V LEFT COMPARISON:  None. FINDINGS: There is an acute and comminuted fracture deformity involving the intertrochanteric portion of the proximal left femur. Medial angulation of the distal fracture fragments identified. IMPRESSION: 1. Acute and comminuted fracture deformity involves the intertrochanteric portions of the proximal left femur. Electronically Signed   By: Kerby Moors M.D.   On: 12/24/2018 20:31   Dg Femur Min 2 Views Left  Result Date: 12/25/2018 CLINICAL DATA:  Hip fracture repair FLUOROSCOPY TIME:  50 seconds. Images: 4 EXAM: DG C-ARM 61-120 MIN COMPARISON:  December 24, 2018 FINDINGS: Patient is status post left hip  fracture repair. A gamma nail and intramedullary femoral rod are identified. A distal interlocking screw is noted. Hardware is in good position. IMPRESSION: Left hip fracture repair as above. Electronically Signed   By: Dorise Bullion III M.D   On: 12/25/2018 13:28     Assessment & Plan:  Plan  I have discontinued Glynis Smiles. Pylant's amLODipine. I am also having her maintain her levothyroxine, oxybutynin, polyethylene glycol powder, aspirin, hydrALAZINE, metoprolol tartrate, insulin glargine, docusate sodium, naloxone, gabapentin, ipratropium-albuterol, and traMADol.  Meds ordered this encounter  Medications  . DISCONTD: traMADol (ULTRAM) 50 MG tablet    Sig: Take 1 tablet (50 mg total) by mouth every 6 (six) hours as needed for moderate pain.    Dispense:  60 tablet    Refill:  0  . traMADol (ULTRAM) 50 MG tablet    Sig: Take 1 tablet (50 mg total) by mouth every 6 (six) hours as needed for moderate pain.    Dispense:  60 tablet    Refill:  0    Problem List Items Addressed This Visit      Unprioritized   CKD (chronic kidney disease), stage III (HCC) (Chronic)    Per nephrology      Closed left hip fracture ([place])    Per ortho, pt and ot      Diabetes mellitus type 2, insulin dependent ([employer]) (Chronic)    Per endo      Dysuria   Relevant Orders   POCT Urinalysis Dipstick (Completed)   Urine Culture (Completed)   Essential hypertension   Relevant Orders   CBC with Differential/Platelet   Comprehensive metabolic panel   Hyperlipidemia LDL goal <70    Tolerating statin, encouraged heart healthy diet, avoid trans fats, minimize simple carbs and saturated fats. Increase exercise as tolerated      Hypertension (Chronic)    Well controlled, no changes to meds. Encouraged heart healthy diet such as the DASH diet and exercise as tolerated.       Relevant Orders   CBC with Differential/Platelet   Comprehensive metabolic panel   Hypothyroidism    Check labs  Stable con't  meds       Other Visit Diagnoses    Left leg pain    -  Primary   Relevant Medications   traMADol (ULTRAM) 50 MG tablet  Other Relevant Orders   US Venous Img Lower Unilateral Left (Completed)   Chronic pain syndrome       Relevant Medications   traMADol (ULTRAM) 50 MG tablet   Uncontrolled type 2 diabetes mellitus with hyperglycemia (HCC)       Relevant Orders   Hemoglobin A1c   Hyperlipidemia associated with type 2 diabetes mellitus ([place])       Relevant Orders   Lipid panel      Follow-up: Return in about 3 months (around 05/01/2019), or if symptoms worsen or fail to improve, for hypertension, hyperlipidemia, diabetes II.  Ann Held, DO

## 2019-01-31 NOTE — Patient Instructions (Signed)
Hip Fracture Treated With ORIF  A hip fracture is a break in the upper part of the thigh bone (femur). This is usually the result of an injury, often a fall. Open reduction with internal fixation (ORIF) is a common surgery used to fix a hip fracture or a broken hip. A surgical cut (incision) in the skin will be made to open the fracture area. This lets your health care provider see the broken bone. The bone pieces will then be put back together. Hardware, such as nails, pins, or compression screws will be used to hold the bone pieces in place. Tell a health care provider about:  Any allergies you have.  All medicines you are taking, including vitamins, herbs, eye drops, creams, and over-the-counter medicines.  Any problems you or family members have had with anesthetic medicines.  Any blood disorders you have.  Any surgeries you have had.  Any medical conditions you have.  Whether you are pregnant or may be pregnant. What are the risks? Generally, this is a safe procedure. However, problems may occur, including:  Infection.  Bleeding.  Allergic reactions to medicines.  Blood clot. This can form in the leg and travel to the lungs.  Damage to nerves or other structures.  Lung infection (pneumonia).  Continuing pain.  Difficulty walking.  Failure of bones to heal. What happens before the procedure? Staying hydrated Follow instructions from your health care provider about hydration, which may include:  Up to 2 hours before the procedure - you may continue to drink clear liquids, such as water, clear fruit juice, black coffee, and plain tea. Eating and drinking restrictions Follow instructions from your health care provider about eating and drinking, which may include:  8 hours before the procedure - stop eating heavy meals or foods such as meat, fried foods, or fatty foods.  6 hours before the procedure - stop eating light meals or foods, such as toast or cereal.  6  hours before the procedure - stop drinking milk or drinks that contain milk.  2 hours before the procedure - stop drinking clear liquids. Medicines Ask your health care provider about:  Changing or stopping your regular medicines. This is especially important if you are taking diabetes medicines or blood thinners.  Taking medicines such as aspirin and ibuprofen. These medicines can thin your blood. Do not take these medicines unless your health care provider tells you to take them.  Taking over-the-counter medicines, vitamins, herbs, and supplements. General instructions  A thorough medical exam will be done to ensure that you are healthy enough for surgery. Tests include: ? A physical exam. ? A test of how your nerves and veins work (neurovascular assessment). ? Blood tests. ? Urine test. ? Electrocardiogram (ECG).  You may have imaging tests such as X-rays, CT scan, or MRI.  Plan to have someone take you home from the hospital or clinic.  Plan to have someone care for you for several days after you leave the hospital or clinic. This is important.  Ask your health care provider what steps will be taken to prevent infection. These may include: ? Removing hair at the surgery site. ? Washing skin with a germ-killing soap. ? Antibiotic medicine. What happens during the procedure?  An IV will be inserted into one of your veins.  You will be given one or more of the following: ? A medicine to help you relax (sedative). ? A medicine to numb the area (local anesthetic). ? A medicine that makes  you fall asleep (general anesthetic). ? A medicine that is injected into your spine to numb the area below and slightly above the injection site (spinal anesthetic). ? A medicine that is injected into an area of your body to numb everything below the injection site (regional anesthetic).  Small monitors will be attached to your body. They will be used to check your heart, blood pressure, and  oxygen level.  You will receive an antibiotic through your IV during surgery.  A catheter may be inserted into your bladder to collect urine while you are asleep.  Your health care provider will: ? Make an incision over the hip. ? Move your bone pieces back into normal alignment and position. ? Secure the bone pieces with hardware (nails, pins, or compression screws).  X-rays may be taken to make sure the bone pieces are in the correct position.  The incision will be closed with small stitches (sutures) or staples.  A bandage (dressing) or some other kind of wound cover will be placed over the incision. The procedure may vary among health care providers and hospitals. What happens after the procedure?  Your blood pressure, heart rate, breathing rate, and blood oxygen level will be monitored until you leave the hospital or clinic.  You may continue to receive fluids through the IV.  You will receive additional antibiotics through your IV for the next 24 hours.  You will be given medicine for pain as needed.  You may receive a blood thinner to prevent blood clots.  You may have to wear compression stockings. These stockings help to prevent blood clots and reduce swelling in your legs.  You may be given instructions about how much body weight you can safely support on your injured leg (weight-bearing restrictions). You may be given crutches, a cane, or a walker to help you move around so that you do not bear any weight on your injured leg.  You will be helped out of bed so you can begin moving around. This is important to improve blood flow and breathing. Summary  Open reduction with internal fixation (ORIF) is a common surgery used to fix a hip fracture or a broken hip.  You will be given anesthetic medicines to make you fall asleep, and your health care provider will fix your fracture with nails, pins, or compression screws.  After the surgery, you may receive a blood thinner  or wear compression stockings to prevent blood clots. This information is not intended to replace advice given to you by your health care provider. Make sure you discuss any questions you have with your health care provider. Document Released: 11/08/2009 Document Revised: 12/31/2017 Document Reviewed: 12/31/2017 Elsevier Interactive Patient Education  2019 [employer].

## 2019-02-01 LAB — URINE CULTURE
MICRO NUMBER:: 256611
SPECIMEN QUALITY:: ADEQUATE

## 2019-02-02 NOTE — Assessment & Plan Note (Signed)
Per ortho, pt and ot

## 2019-02-02 NOTE — Assessment & Plan Note (Signed)
Per nephrology

## 2019-02-02 NOTE — Assessment & Plan Note (Signed)
Per endo

## 2019-02-02 NOTE — Assessment & Plan Note (Signed)
Well controlled, no changes to meds. Encouraged heart healthy diet such as the DASH diet and exercise as tolerated.

## 2019-02-02 NOTE — Assessment & Plan Note (Signed)
Tolerating statin, encouraged heart healthy diet, avoid trans fats, minimize simple carbs and saturated fats. Increase exercise as tolerated 

## 2019-02-02 NOTE — Assessment & Plan Note (Signed)
Check labs Stable con't meds 

## 2019-02-03 ENCOUNTER — Telehealth: Payer: Self-pay | Admitting: Family Medicine

## 2019-02-03 ENCOUNTER — Telehealth: Payer: Self-pay | Admitting: *Deleted

## 2019-02-03 ENCOUNTER — Other Ambulatory Visit: Payer: Self-pay

## 2019-02-03 ENCOUNTER — Ambulatory Visit: Payer: Self-pay

## 2019-02-03 DIAGNOSIS — S72042D Displaced fracture of base of neck of left femur, subsequent encounter for closed fracture with routine healing: Secondary | ICD-10-CM | POA: Diagnosis not present

## 2019-02-03 DIAGNOSIS — E1122 Type 2 diabetes mellitus with diabetic chronic kidney disease: Secondary | ICD-10-CM | POA: Diagnosis not present

## 2019-02-03 DIAGNOSIS — I50810 Right heart failure, unspecified: Secondary | ICD-10-CM | POA: Diagnosis not present

## 2019-02-03 DIAGNOSIS — I130 Hypertensive heart and chronic kidney disease with heart failure and stage 1 through stage 4 chronic kidney disease, or unspecified chronic kidney disease: Secondary | ICD-10-CM | POA: Diagnosis not present

## 2019-02-03 DIAGNOSIS — I5032 Chronic diastolic (congestive) heart failure: Secondary | ICD-10-CM | POA: Diagnosis not present

## 2019-02-03 DIAGNOSIS — N183 Chronic kidney disease, stage 3 (moderate): Secondary | ICD-10-CM | POA: Diagnosis not present

## 2019-02-03 NOTE — Telephone Encounter (Signed)
Done notified of verbal orders per Dr. Etter Sjogren

## 2019-02-03 NOTE — Telephone Encounter (Signed)
Copied from [place] [phone] [id number]. Topic: Quick Communication - Home Health Verbal Orders >> Feb 03, 2019  2:54 PM Berneta Levins wrote: Caller/Agency: Timmothy Sours from [place] Number: [phone], [place] to leave a message Requesting OT/PT/Skilled Nursing/Social Work: OT Frequency: move visit currently scheduled for week of the 15th to the week of the 8th.  Pt is ready for meal prep training sooner than expected.

## 2019-02-03 NOTE — Telephone Encounter (Signed)
He needs the metoprolol due to heart failure dx-----  We can reevaluate her bp sooner than 3 months if she likes to see if she needs something else or she can discuss with cardiology if she had app with them

## 2019-02-03 NOTE — Telephone Encounter (Signed)
Patients asks if she needs to be switched to a bp med that will protect her kidneys since she only has one?

## 2019-02-04 ENCOUNTER — Telehealth: Payer: Self-pay | Admitting: *Deleted

## 2019-02-04 DIAGNOSIS — I130 Hypertensive heart and chronic kidney disease with heart failure and stage 1 through stage 4 chronic kidney disease, or unspecified chronic kidney disease: Secondary | ICD-10-CM | POA: Diagnosis not present

## 2019-02-04 DIAGNOSIS — I5032 Chronic diastolic (congestive) heart failure: Secondary | ICD-10-CM | POA: Diagnosis not present

## 2019-02-04 DIAGNOSIS — N183 Chronic kidney disease, stage 3 (moderate): Secondary | ICD-10-CM | POA: Diagnosis not present

## 2019-02-04 DIAGNOSIS — S72042D Displaced fracture of base of neck of left femur, subsequent encounter for closed fracture with routine healing: Secondary | ICD-10-CM | POA: Diagnosis not present

## 2019-02-04 DIAGNOSIS — E1122 Type 2 diabetes mellitus with diabetic chronic kidney disease: Secondary | ICD-10-CM | POA: Diagnosis not present

## 2019-02-04 DIAGNOSIS — I50810 Right heart failure, unspecified: Secondary | ICD-10-CM | POA: Diagnosis not present

## 2019-02-04 NOTE — Telephone Encounter (Signed)
Received Client Medication-Drug Interaction Report; forwarded to provider for review/SLS

## 2019-02-04 NOTE — Patient Outreach (Signed)
Late entry from 02/03/2019:  Placed call to patient to follow up on interest of [facility] services. Patient reports she is unsure. Reports she will call me back in 1 week. Reports she is doing well and has seen her primary MD for follow up.  PLAN: will call again in 1 week if I have not heard from patient.  Tomasa Rand, RN, BSN, CEN [facility] [employer] [phone]

## 2019-02-05 ENCOUNTER — Other Ambulatory Visit: Payer: Self-pay | Admitting: Pharmacist

## 2019-02-05 NOTE — Telephone Encounter (Signed)
Verbal order given on change.

## 2019-02-05 NOTE — Patient Outreach (Signed)
[place] [facility]) Care Management  [place]  02/05/2019  Margaret Dyer [birth year] [id number]   Reason for call: Medication assistance  Unsuccessful telephone call attempt #3 to patient.   HIPAA compliant voicemail left requesting a return call  Plan:  I will follow-up on 10th business day from opening case.  If no response from patient at this time, I will close [facility] case   Ralene Bathe, PharmD, [place] [phone]

## 2019-02-05 NOTE — Telephone Encounter (Signed)
Patient notified and she will let us know what she wants to do.

## 2019-02-06 ENCOUNTER — Ambulatory Visit: Payer: Medicare Other | Admitting: Family Medicine

## 2019-02-06 ENCOUNTER — Other Ambulatory Visit: Payer: Self-pay

## 2019-02-06 ENCOUNTER — Ambulatory Visit: Payer: Self-pay

## 2019-02-06 DIAGNOSIS — N183 Chronic kidney disease, stage 3 (moderate): Secondary | ICD-10-CM | POA: Diagnosis not present

## 2019-02-06 DIAGNOSIS — I50810 Right heart failure, unspecified: Secondary | ICD-10-CM | POA: Diagnosis not present

## 2019-02-06 DIAGNOSIS — I130 Hypertensive heart and chronic kidney disease with heart failure and stage 1 through stage 4 chronic kidney disease, or unspecified chronic kidney disease: Secondary | ICD-10-CM | POA: Diagnosis not present

## 2019-02-06 DIAGNOSIS — I5032 Chronic diastolic (congestive) heart failure: Secondary | ICD-10-CM | POA: Diagnosis not present

## 2019-02-06 DIAGNOSIS — E1122 Type 2 diabetes mellitus with diabetic chronic kidney disease: Secondary | ICD-10-CM | POA: Diagnosis not present

## 2019-02-06 DIAGNOSIS — S72042D Displaced fracture of base of neck of left femur, subsequent encounter for closed fracture with routine healing: Secondary | ICD-10-CM | POA: Diagnosis not present

## 2019-02-06 NOTE — Patient Outreach (Signed)
[place] [facility]) Care Management  02/06/2019  Margaret Dyer 1935-10-13 [id number]   BSW made outreach call to patient on 02/04/19 (failed to document call) to follow up on interest in transportation resources previously discussed.  She reported that it was not a good time to talk and asked for a call back later.  Unsuccessful outreach to patient today.  BSW will attempt to reach again within four business days.  Ronn Melena, BSW Social Worker [phone]

## 2019-02-10 ENCOUNTER — Other Ambulatory Visit: Payer: Self-pay

## 2019-02-10 ENCOUNTER — Other Ambulatory Visit: Payer: Self-pay | Admitting: Pharmacist

## 2019-02-10 NOTE — Patient Outreach (Signed)
Care coordination:  Placed call to patient who reports she can not talk right now and has not decided about [facility] services.  PLAN: Will call back in 3 days.  Tomasa Rand, RN, BSN, CEN [facility] [employer] [phone]

## 2019-02-10 NOTE — Patient Outreach (Signed)
[place] [facility]) Care Management [place]  02/10/2019  AVONELLE VIVEROS 09/17/1935 [id number]  Reason for referral: medication assistance  [facility] pharmacy case is being closed due to the following reasons:  We have been unable to establish and/or maintain contact with the patient.   Ralene Bathe, PharmD, [place] [phone]

## 2019-02-11 ENCOUNTER — Ambulatory Visit: Payer: Self-pay

## 2019-02-11 ENCOUNTER — Telehealth: Payer: Self-pay | Admitting: *Deleted

## 2019-02-11 ENCOUNTER — Other Ambulatory Visit: Payer: Self-pay

## 2019-02-11 DIAGNOSIS — S72042D Displaced fracture of base of neck of left femur, subsequent encounter for closed fracture with routine healing: Secondary | ICD-10-CM | POA: Diagnosis not present

## 2019-02-11 DIAGNOSIS — I130 Hypertensive heart and chronic kidney disease with heart failure and stage 1 through stage 4 chronic kidney disease, or unspecified chronic kidney disease: Secondary | ICD-10-CM | POA: Diagnosis not present

## 2019-02-11 DIAGNOSIS — E1122 Type 2 diabetes mellitus with diabetic chronic kidney disease: Secondary | ICD-10-CM | POA: Diagnosis not present

## 2019-02-11 DIAGNOSIS — I50810 Right heart failure, unspecified: Secondary | ICD-10-CM | POA: Diagnosis not present

## 2019-02-11 DIAGNOSIS — I5032 Chronic diastolic (congestive) heart failure: Secondary | ICD-10-CM | POA: Diagnosis not present

## 2019-02-11 DIAGNOSIS — N183 Chronic kidney disease, stage 3 (moderate): Secondary | ICD-10-CM | POA: Diagnosis not present

## 2019-02-11 NOTE — Telephone Encounter (Signed)
Received Physician Orders from [facility]; forwarded to provider/SLS 03/10  Received Home Health Certification and Plan of Care; forwarded to provider/SLS 03/10

## 2019-02-11 NOTE — Patient Outreach (Signed)
[place] [facility]) Care Management  02/11/2019  DERIONNA SALVADOR 08-Sep-1935 [id number]   Third unsuccessful outreach to patient today; left voicemail message.  BSW will close case if no response by 02/17/19.  Ronn Melena, BSW Social Worker [phone]

## 2019-02-13 ENCOUNTER — Ambulatory Visit: Payer: Self-pay

## 2019-02-13 DIAGNOSIS — I130 Hypertensive heart and chronic kidney disease with heart failure and stage 1 through stage 4 chronic kidney disease, or unspecified chronic kidney disease: Secondary | ICD-10-CM | POA: Diagnosis not present

## 2019-02-13 DIAGNOSIS — E1122 Type 2 diabetes mellitus with diabetic chronic kidney disease: Secondary | ICD-10-CM | POA: Diagnosis not present

## 2019-02-13 DIAGNOSIS — I5032 Chronic diastolic (congestive) heart failure: Secondary | ICD-10-CM | POA: Diagnosis not present

## 2019-02-13 DIAGNOSIS — N183 Chronic kidney disease, stage 3 (moderate): Secondary | ICD-10-CM | POA: Diagnosis not present

## 2019-02-13 DIAGNOSIS — I50810 Right heart failure, unspecified: Secondary | ICD-10-CM | POA: Diagnosis not present

## 2019-02-13 DIAGNOSIS — S72042D Displaced fracture of base of neck of left femur, subsequent encounter for closed fracture with routine healing: Secondary | ICD-10-CM | POA: Diagnosis not present

## 2019-02-14 ENCOUNTER — Other Ambulatory Visit: Payer: Self-pay

## 2019-02-14 NOTE — Patient Outreach (Signed)
Case closure for nursing: Placed call to patient to offer services. Patient answered and reports she is not interested in nursing services at this time.   PLAN: close to nursing. Will update other team members.  Margaret Rand, RN, BSN, CEN [facility] [employer] [phone]

## 2019-02-17 ENCOUNTER — Other Ambulatory Visit: Payer: Self-pay

## 2019-02-17 ENCOUNTER — Telehealth: Payer: Self-pay | Admitting: Family Medicine

## 2019-02-17 DIAGNOSIS — I5032 Chronic diastolic (congestive) heart failure: Secondary | ICD-10-CM | POA: Diagnosis not present

## 2019-02-17 DIAGNOSIS — I50810 Right heart failure, unspecified: Secondary | ICD-10-CM | POA: Diagnosis not present

## 2019-02-17 DIAGNOSIS — I130 Hypertensive heart and chronic kidney disease with heart failure and stage 1 through stage 4 chronic kidney disease, or unspecified chronic kidney disease: Secondary | ICD-10-CM | POA: Diagnosis not present

## 2019-02-17 DIAGNOSIS — S72042D Displaced fracture of base of neck of left femur, subsequent encounter for closed fracture with routine healing: Secondary | ICD-10-CM | POA: Diagnosis not present

## 2019-02-17 DIAGNOSIS — E1122 Type 2 diabetes mellitus with diabetic chronic kidney disease: Secondary | ICD-10-CM | POA: Diagnosis not present

## 2019-02-17 DIAGNOSIS — N183 Chronic kidney disease, stage 3 (moderate): Secondary | ICD-10-CM | POA: Diagnosis not present

## 2019-02-17 NOTE — Telephone Encounter (Signed)
Please advise.

## 2019-02-17 NOTE — Patient Outreach (Signed)
[place]) Care Management  02/17/2019  Margaret Dyer [birth year] [id number]  [facility] BSW closing case due to inability to maintain contact.  Ronn Melena, BSW Social Worker [phone]

## 2019-02-17 NOTE — Telephone Encounter (Signed)
She probably needs to be seen but there is so much sickness going around--- as long as she is not sick we can see in an am app slot over the next few days

## 2019-02-17 NOTE — Telephone Encounter (Signed)
LMOM for Don to return call. Okay for PEC to discuss.

## 2019-02-17 NOTE — Telephone Encounter (Signed)
Copied from [place] [id number]. Topic: Quick Communication - Home Health Verbal Orders >> Feb 17, 2019 11:35 AM Rayann Heman wrote: Timmothy Sours calling with [name] wanted to let us know that the patient is complaining of pain 1-3 out of 10 under right breast for about a week. Should pt make an appointment? Do would like a call back for follow up. Please advise

## 2019-02-18 DIAGNOSIS — I130 Hypertensive heart and chronic kidney disease with heart failure and stage 1 through stage 4 chronic kidney disease, or unspecified chronic kidney disease: Secondary | ICD-10-CM | POA: Diagnosis not present

## 2019-02-18 DIAGNOSIS — I50810 Right heart failure, unspecified: Secondary | ICD-10-CM | POA: Diagnosis not present

## 2019-02-18 DIAGNOSIS — E1122 Type 2 diabetes mellitus with diabetic chronic kidney disease: Secondary | ICD-10-CM | POA: Diagnosis not present

## 2019-02-18 DIAGNOSIS — I5032 Chronic diastolic (congestive) heart failure: Secondary | ICD-10-CM | POA: Diagnosis not present

## 2019-02-18 DIAGNOSIS — S72042D Displaced fracture of base of neck of left femur, subsequent encounter for closed fracture with routine healing: Secondary | ICD-10-CM | POA: Diagnosis not present

## 2019-02-18 DIAGNOSIS — N183 Chronic kidney disease, stage 3 (moderate): Secondary | ICD-10-CM | POA: Diagnosis not present

## 2019-02-18 NOTE — Telephone Encounter (Signed)
Noted, thank you

## 2019-02-18 NOTE — Telephone Encounter (Signed)
Hi Margaret Dyer, I just spoke with you at 9:00 am on 3/17 with Margaret Dyer on the line from [place] concerning Margaret Dyer. He is fine and is letting Margaret Dyer know that unless the pain gets worse that she hold off on the appointment. He however needs you to call him and leave a message on his cell so he will have clinical documentation.  Thanks [phone]

## 2019-02-19 ENCOUNTER — Telehealth: Payer: Self-pay | Admitting: *Deleted

## 2019-02-19 NOTE — Telephone Encounter (Signed)
Patient left a msg on the refill vm questioning how many tablets of hydralazine she should be taking daily. Patient can be reached at [phone]. Thanks, MI

## 2019-02-22 ENCOUNTER — Other Ambulatory Visit: Payer: Self-pay | Admitting: Internal Medicine

## 2019-02-24 ENCOUNTER — Telehealth: Payer: Self-pay

## 2019-02-24 MED ORDER — FUROSEMIDE 40 MG PO TABS: 40.0000 mg | ORAL_TABLET | Freq: Every day | ORAL | 1 refills | Status: DC | PRN

## 2019-02-24 MED ORDER — POTASSIUM CHLORIDE CRYS ER 20 MEQ PO TBCR: 20.0000 meq | EXTENDED_RELEASE_TABLET | Freq: Every day | ORAL | 1 refills | Status: DC | PRN

## 2019-02-24 NOTE — Addendum Note (Signed)
Addended by: Lamar Laundry on: 02/24/2019 03:35 PM   Modules accepted: Orders

## 2019-02-24 NOTE — Telephone Encounter (Signed)
Spoke with the pt. Adv her that the request for Lasix refill was denied because it was to be stopped when she was d/c from the hospital on 12/30/18. Pt sts that she never stopped the medication and has continued to take Lasix 64m and Potassium  262m daily everyday. Pt is due for f/u with Dr.Hilty in May 2020. Adv the pt that I will need to fwd the message to Dr.Hilty to rqst an ok to refill. Pt verbalized understanding and voiced appreciation for the call.

## 2019-02-24 NOTE — Telephone Encounter (Signed)
Spoke with the pt and made her aware of Dr.Hilty's response and recommendation. Rx sent to the pt pharmacy for Lasix 30m daily prn and Potassium 270m daily on the days she takes Lasix. Pt sts that she does weigh daily and that her weights have been stable., she denies swelling or sob. Adv her of Dr.Hilty's recommendation to take lasix and potassium daily as needed for weight gain or swelling.  Pt verbalized understanding and voiced appreciation for the assistance.

## 2019-02-24 NOTE — Telephone Encounter (Signed)
Patient called and left a message wanting to know why her Furosemide prescription was denied. Please call the patient back to discuss.

## 2019-02-24 NOTE — Telephone Encounter (Signed)
Stopped during recent hospitalization due to acute kidney injury. Repeat labs ordered by PCP and pending. I would say ok to refill both - probably could use it on a sliding scale for weight gain or edema.  Dr. Lemmie Evens

## 2019-02-26 ENCOUNTER — Telehealth: Payer: Self-pay | Admitting: *Deleted

## 2019-02-26 NOTE — Telephone Encounter (Signed)
Received Home Health Episode Detail Report for review from [facility]; forwarded to provider/SLS 03/25

## 2019-03-01 ENCOUNTER — Other Ambulatory Visit: Payer: Self-pay | Admitting: Family Medicine

## 2019-03-06 ENCOUNTER — Telehealth: Payer: Self-pay | Admitting: *Deleted

## 2019-03-06 NOTE — Telephone Encounter (Signed)
Copied from [place] [id number]. Topic: General - Other >> Mar 05, 2019  5:59 PM Ivar Drape wrote: Reason for CRM:   Patient would like to set up a WEBex appt with her provider for pain in side and between her shoulder blades.  This pain has been going on for 2-3weeks.  The email has been verified and she would like for the following telephone number to be used for the visit:  [phone]

## 2019-03-10 ENCOUNTER — Ambulatory Visit (INDEPENDENT_AMBULATORY_CARE_PROVIDER_SITE_OTHER): Payer: Medicare Other | Admitting: Family Medicine

## 2019-03-10 ENCOUNTER — Encounter: Payer: Self-pay | Admitting: Family Medicine

## 2019-03-10 ENCOUNTER — Other Ambulatory Visit: Payer: Self-pay

## 2019-03-10 DIAGNOSIS — R1011 Right upper quadrant pain: Secondary | ICD-10-CM

## 2019-03-10 NOTE — Progress Notes (Signed)
Virtual Visit via Video Note  I connected with Margaret Dyer on 03/10/19 at  2:30 PM EDT by a video enabled telemedicine application and verified that I am speaking with the correct person using two identifiers.   I discussed the limitations of evaluation and management by telemedicine and the availability of in person appointments. The patient expressed understanding and agreed to proceed.  History of Present Illness: Pt is home with her daughter --- -she c/o RUQ pain that has been worsening over the last 2-3 weeks --she has been afraid to go out because of covid 19  -- food makes it worse and it radiates to R shoulder blade.  No NVD No fevers    Observations/Objective: Afebrile NAD Pt states pain is RUQ -- worse with food  Unable to do vitals due to web visit and covid 19  Assessment and Plan: 1. RUQ pain R/o GB  Will need to get labs if neg [place]  Go to ER if pain worsens  - US Abdomen Limited RUQ; Future   Follow Up Instructions:    I discussed the assessment and treatment plan with the patient. The patient was provided an opportunity to ask questions and all were answered. The patient agreed with the plan and demonstrated an understanding of the instructions.   The patient was advised to call back or seek an in-person evaluation if the symptoms worsen or if the condition fails to improve as anticipated.     Ann Held, DO

## 2019-03-10 NOTE — Telephone Encounter (Signed)
Pt updated with recommendation and voiced understanding.

## 2019-03-10 NOTE — Telephone Encounter (Signed)
Pt calling for clarification on how to take Hydralazine. Informed pt that per discharge instruction on 1/21, she is to take 25 mg every 8 hours. Pt states she has been taking it BID and doesn't know what her BP has been running as she hasn't checked it lately. She state she will call back later if she is able to get a reading. Will route to MD for clarification on dose.

## 2019-03-10 NOTE — Telephone Encounter (Signed)
Generally a TID medication - I would advise using it that way.  Dr Lemmie Evens

## 2019-03-10 NOTE — Telephone Encounter (Signed)
Patient scheduled for today

## 2019-03-11 ENCOUNTER — Other Ambulatory Visit: Payer: Medicare Other

## 2019-03-12 ENCOUNTER — Other Ambulatory Visit: Payer: Self-pay

## 2019-03-12 ENCOUNTER — Ambulatory Visit
Admission: RE | Admit: 2019-03-12 | Discharge: 2019-03-12 | Disposition: A | Discharge status: Home / Self Care | Payer: Medicare Other | Source: Ambulatory Visit | Attending: Family Medicine | Admitting: Family Medicine

## 2019-03-12 DIAGNOSIS — R1011 Right upper quadrant pain: Secondary | ICD-10-CM

## 2019-03-17 ENCOUNTER — Telehealth: Payer: Self-pay | Admitting: *Deleted

## 2019-03-17 NOTE — Telephone Encounter (Signed)
See [place]

## 2019-03-17 NOTE — Telephone Encounter (Signed)
See u/s result note.

## 2019-03-17 NOTE — Telephone Encounter (Signed)
Copied from [place] [id number] [phone]. Topic: General - Other >> Mar 13, 2019  3:40 PM Leward Quan A wrote: Reason for CRM: Patient called to get results of her Ultrasound done on 03/12/2019. Please call patient at Ph# [phone]

## 2019-03-18 ENCOUNTER — Other Ambulatory Visit: Payer: Medicare Other

## 2019-03-18 NOTE — Addendum Note (Signed)
Addended by: Trenda Moots on: 06/13/6545 02:14 PM   Modules accepted: Orders

## 2019-03-18 NOTE — Addendum Note (Signed)
Addended by: Caffie Pinto on: 03/18/2019 01:47 PM   Modules accepted: Orders

## 2019-03-19 ENCOUNTER — Other Ambulatory Visit (INDEPENDENT_AMBULATORY_CARE_PROVIDER_SITE_OTHER): Payer: Medicare Other

## 2019-03-19 ENCOUNTER — Telehealth: Payer: Self-pay

## 2019-03-19 DIAGNOSIS — I10 Essential (primary) hypertension: Secondary | ICD-10-CM | POA: Diagnosis not present

## 2019-03-19 DIAGNOSIS — K760 Fatty (change of) liver, not elsewhere classified: Secondary | ICD-10-CM

## 2019-03-19 DIAGNOSIS — R109 Unspecified abdominal pain: Secondary | ICD-10-CM

## 2019-03-19 DIAGNOSIS — E1165 Type 2 diabetes mellitus with hyperglycemia: Secondary | ICD-10-CM | POA: Diagnosis not present

## 2019-03-19 DIAGNOSIS — E785 Hyperlipidemia, unspecified: Secondary | ICD-10-CM

## 2019-03-19 DIAGNOSIS — E1169 Type 2 diabetes mellitus with other specified complication: Secondary | ICD-10-CM

## 2019-03-19 LAB — CBC WITH DIFFERENTIAL/PLATELET
Basophils Absolute: 0.1 10*3/uL (ref 0.0–0.1)
Basophils Relative: 0.8 % (ref 0.0–3.0)
Eosinophils Absolute: 0.1 10*3/uL (ref 0.0–0.7)
Eosinophils Relative: 1.8 % (ref 0.0–5.0)
HCT: 41.9 % (ref 36.0–46.0)
Hemoglobin: 14.3 g/dL (ref 12.0–15.0)
Lymphocytes Relative: 15 % (ref 12.0–46.0)
Lymphs Abs: 1 10*3/uL (ref 0.7–4.0)
MCHC: 34.2 g/dL (ref 30.0–36.0)
MCV: 87.3 fl (ref 78.0–100.0)
Monocytes Absolute: 0.5 10*3/uL (ref 0.1–1.0)
Monocytes Relative: 8.2 % (ref 3.0–12.0)
Neutro Abs: 4.7 10*3/uL (ref 1.4–7.7)
Neutrophils Relative %: 74.2 % (ref 43.0–77.0)
Platelets: 98 10*3/uL — ABNORMAL LOW (ref 150.0–400.0)
RBC: 4.8 Mil/uL (ref 3.87–5.11)
RDW: 14.8 % (ref 11.5–15.5)
WBC: 6.4 10*3/uL (ref 4.0–10.5)

## 2019-03-19 LAB — COMPREHENSIVE METABOLIC PANEL
ALT: 22 U/L (ref 0–35)
AST: 23 U/L (ref 0–37)
Albumin: 3.9 g/dL (ref 3.5–5.2)
Alkaline Phosphatase: 64 U/L (ref 39–117)
BUN: 27 mg/dL — ABNORMAL HIGH (ref 6–23)
CO2: 30 mEq/L (ref 19–32)
Calcium: 9.6 mg/dL (ref 8.4–10.5)
Chloride: 102 mEq/L (ref 96–112)
Creatinine, Ser: 1.13 mg/dL (ref 0.40–1.20)
GFR: 46.12 mL/min — ABNORMAL LOW (ref >60.00)
Glucose, Bld: 161 mg/dL — ABNORMAL HIGH (ref 70–99)
Potassium: 3.8 mEq/L (ref 3.5–5.1)
Sodium: 140 mEq/L (ref 135–145)
Total Bilirubin: 0.7 mg/dL (ref 0.2–1.2)
Total Protein: 6.8 g/dL (ref 6.0–8.3)

## 2019-03-19 LAB — LIPID PANEL
Cholesterol: 200 mg/dL (ref 0–200)
HDL: 40.5 mg/dL (ref >39.00)
LDL Cholesterol: 134 mg/dL — ABNORMAL HIGH (ref 0–99)
NonHDL: 159.46
Total CHOL/HDL Ratio: 5
Triglycerides: 125 mg/dL (ref 0.0–149.0)
VLDL: 25 mg/dL (ref 0.0–40.0)

## 2019-03-19 LAB — HEMOGLOBIN A1C: Hgb A1c MFr Bld: 6.2 % (ref 4.6–6.5)

## 2019-03-19 NOTE — Telephone Encounter (Signed)
We always check kidney function before we give contrast--- labs should be done first

## 2019-03-19 NOTE — Telephone Encounter (Signed)
Copied from [place] [phone]. Topic: General - Inquiry >> Mar 19, 2019 11:44 AM Scherrie Gerlach wrote: Reason for CRM: pt just wants to remind dr Etter Sjogren she has only one kidney and may have kidney disease. And is that ok to do the CT with contrast. She wanted to remind the dr about her health condition

## 2019-03-19 NOTE — Telephone Encounter (Signed)
When you get her labs back please put in there about if you want the ct w or w/o contrast.  She would like it done at [place].

## 2019-03-20 ENCOUNTER — Other Ambulatory Visit: Payer: Self-pay

## 2019-03-20 NOTE — Patient Outreach (Signed)
[facility]) Care Management  03/20/2019  Margaret Dyer 05-17-1935 [id number]   Request received from [facility] Director of Care Management, Bary Castilla, to contact patient regarding possible need for transportation to upcoming MD appointment.   BSW contacted patient today and informed her of reason for call.  Patient said that she was not feeling well today and said that she would call back if transportation is needed.  Patient confirmed contact information for BSW.  Ronn Melena, BSW Social Worker [phone]

## 2019-03-20 NOTE — Telephone Encounter (Signed)
done

## 2019-03-20 NOTE — Telephone Encounter (Signed)
Pt called in to receive her lab results. Pt says that she also want to have a copy mailed to her as usual.   Please call pt back for results when they are in.

## 2019-03-21 ENCOUNTER — Other Ambulatory Visit: Payer: Self-pay | Admitting: Family Medicine

## 2019-03-21 ENCOUNTER — Telehealth: Payer: Self-pay | Admitting: *Deleted

## 2019-03-21 NOTE — Addendum Note (Signed)
Addended by: Kem Boroughs D on: 03/21/2019 04:03 PM   Modules accepted: Orders

## 2019-03-21 NOTE — Telephone Encounter (Signed)
abd pain     ------ cirrhosis

## 2019-03-21 NOTE — Telephone Encounter (Signed)
Patient called  regarding lab results. Advised patient as to results Dr. Carollee Herter reviewed. Patient states she would like Contrast with her CT. States she would like to use [facility] and would like CT to include her liver,gallbladder and abdomen because of pain she is having that goes all the way across these areas. Mailed copy of labs to patient per her request.

## 2019-03-21 NOTE — Telephone Encounter (Signed)
Ct abd and pelvis will get all that--- not the best for gb but if that is a concern after we get the ct -- we can do [place]

## 2019-03-21 NOTE — Telephone Encounter (Signed)
What diagnosis code you want to use and what are you trying to rule out.

## 2019-03-21 NOTE — Telephone Encounter (Signed)
Received Physician Orders for [facility] from [place]; forwarded to provider/SLS 04/17

## 2019-03-24 ENCOUNTER — Encounter: Payer: Self-pay | Admitting: *Deleted

## 2019-03-24 NOTE — Addendum Note (Signed)
Addended by: Kem Boroughs D on: 03/24/2019 10:14 AM   Modules accepted: Orders

## 2019-03-24 NOTE — Telephone Encounter (Signed)
Order placed

## 2019-03-24 NOTE — Addendum Note (Signed)
Addended by: Kem Boroughs D on: 03/24/2019 10:10 AM   Modules accepted: Orders

## 2019-03-25 ENCOUNTER — Telehealth: Payer: Self-pay | Admitting: Internal Medicine

## 2019-03-25 ENCOUNTER — Ambulatory Visit
Admission: RE | Admit: 2019-03-25 | Discharge: 2019-03-25 | Disposition: A | Discharge status: Home / Self Care | Payer: Medicare Other | Source: Ambulatory Visit | Attending: Family Medicine | Admitting: Family Medicine

## 2019-03-25 ENCOUNTER — Other Ambulatory Visit: Payer: Self-pay

## 2019-03-25 DIAGNOSIS — R109 Unspecified abdominal pain: Secondary | ICD-10-CM

## 2019-03-25 DIAGNOSIS — K7460 Unspecified cirrhosis of liver: Secondary | ICD-10-CM | POA: Diagnosis not present

## 2019-03-25 MED ORDER — IOPAMIDOL (ISOVUE-300) INJECTION 61%
80.0000 mL | Freq: Once | INTRAVENOUS | Status: AC | PRN
  Administered 2019-03-25: 15:00:00 80 mL via INTRAVENOUS

## 2019-03-25 NOTE — Telephone Encounter (Signed)
New Message  Patient returning phone call please call back.

## 2019-03-25 NOTE — Telephone Encounter (Signed)
Form faxed back and confirmation received.

## 2019-03-25 NOTE — Telephone Encounter (Signed)
Called patient, she states that she was in [place] home, and was sent home 02/22- the Doctor at [facility] placed her on the Spironolactone daily, and she has been taking it daily since being discharged and ran out yesterday and wanted to know if she should continue this medication.   Advised I would route a message to MD to have recommendations.  Thanks!

## 2019-03-25 NOTE — Telephone Encounter (Signed)
Called patient to verify if she was continuing to take it currently, and when she was placed on it- as I did not currently have it on her list. Left call back number.

## 2019-03-25 NOTE — Telephone Encounter (Signed)
Pt called because she wanted to know if she should continue taking Spironolactone. She was put on a 12.37m dosage 1x daily by Dr. DBevelyn Buckleswhen she was in [facility]. She just wanted to confirm that this medication is still one she should be taking, because Dr. HDebara Pickettdidn't initially prescribe it.  Pt said she has another appt today, and to leave a detailed message if she doesn't answer the phone

## 2019-03-26 ENCOUNTER — Telehealth: Payer: Self-pay | Admitting: *Deleted

## 2019-03-26 MED ORDER — SPIRONOLACTONE 25 MG PO TABS: 12.5000 mg | ORAL_TABLET | Freq: Every day | ORAL | 0 refills | Status: DC

## 2019-03-26 MED ORDER — HYDRALAZINE HCL 25 MG PO TABS: 25.0000 mg | ORAL_TABLET | Freq: Three times a day (TID) | ORAL | 0 refills | Status: DC

## 2019-03-26 NOTE — Telephone Encounter (Signed)
-----  Message from Ann Held, [place] sent at 03/25/2019  5:05 PM EDT ----- Cirrhosis of the liver on CT--- refer to GI

## 2019-03-26 NOTE — Telephone Encounter (Signed)
I sent in RX to pharmacy. Patient notified.

## 2019-03-26 NOTE — Telephone Encounter (Signed)
Probably should remain on it - ok to refill under my name.  Dr. Lemmie Evens

## 2019-03-26 NOTE — Telephone Encounter (Signed)
Margaret Dyer -- Lab results were mailed to pt on 4/20. I have not mailed CT results as it looks she has not been notified yet?  Copied from [place] [phone]. Topic: Quick Communication - Other Results (Clinic Use ONLY) >> Mar 26, 2019 11:45 AM Lennox Solders wrote: Pt is calling and would like ct abd / pelvic results and also a copy of her last blood work and ultrasound report to be mail to her

## 2019-03-27 ENCOUNTER — Ambulatory Visit: Payer: Self-pay

## 2019-03-27 NOTE — Telephone Encounter (Signed)
Pt daughter called to say that her mother has had bad pain for 3 weeks.  She states that the pain in rt side upper back and it travels to the front under the ribs.  She states that her mother has not eaten because the pain gets worse. She stay in the bed.  She has had a CT scan that they are waiting for results.  She states that her mother placed a call today about the results but has still not heard back. Per protocol pt should be seen today. Per protocol I attempted to contact the office. No answer. Office was 10 minutes from closing. Pt daughter was encouraged to take her mother to ER for further evaluation but she refused stating that her mother uses O2 and she the daughter is going through Chemo therapy. Note will be routed to office for review tomorrow.  Reason for Disposition . [1] SEVERE back pain (e.g., excruciating, unable to do any normal activities) AND [2] not improved 2 hours after pain medicine  Answer Assessment - Initial Assessment Questions 1. ONSET: "When did the pain begin?"      3 weeks ago 2. LOCATION: "Where does it hurt?" (upper, mid or lower back)     Rt upper back radiates to rt side 3. SEVERITY: "How bad is the pain?"  (e.g., Scale 1-10; mild, moderate, or severe)   - MILD (1-3): doesn't interfere with normal activities    - MODERATE (4-7): interferes with normal activities or awakens from sleep    - SEVERE (8-10): excruciating pain, unable to do any normal activities      severe 4. PATTERN: "Is the pain constant?" (e.g., yes, no; constant, intermittent)      constant 5. RADIATION: "Does the pain shoot into your legs or elsewhere?"     Rt ribs 6. CAUSE:  "What do you think is causing the back pain?"      Unsure but has had mult test 7. BACK OVERUSE:  "Any recent lifting of heavy objects, strenuous work or exercise?"     No 8. MEDICATIONS: "What have you taken so far for the pain?" (e.g., nothing, acetaminophen, NSAIDS)    tramadol 9. NEUROLOGIC SYMPTOMS: "Do you  have any weakness, numbness, or problems with bowel/bladder control?"     constipation 10. OTHER SYMPTOMS: "Do you have any other symptoms?" (e.g., fever, abdominal pain, burning with urination, blood in urine)       no 11. PREGNANCY: "Is there any chance you are pregnant?" (e.g., yes, no; LMP)      N/A  Protocols used: BACK PAIN-A-AH

## 2019-03-27 NOTE — Telephone Encounter (Signed)
Pt called in requesting her imaging results.   Call back to advise.

## 2019-03-28 NOTE — Telephone Encounter (Signed)
Results given to patient.  Advised per Dr. Lown if she is having severe pain then she needs to go to ED.  She will call her GI, Dr. Buccini today as well 

## 2019-03-28 NOTE — Telephone Encounter (Signed)
Results given to patient.  Advised per Dr. Sherrine Maples if she is having severe pain then she needs to go to ED.  She will call her GI, Dr. Cristina Gong today as well

## 2019-03-28 NOTE — Telephone Encounter (Signed)
See additional phone note from 03/26/19 regarding pt's CT result.

## 2019-03-28 NOTE — Telephone Encounter (Signed)
Please see CT results--- pt has cirrhoisis of the liver ----- she needs GI referral but if her pain is that severe she really should go to the ER

## 2019-03-28 NOTE — Telephone Encounter (Signed)
Dr Carollee Herter -- there is a separate note about pt's CT results that we can inform her of but do you have any other recommendation regarding pt's concerns below?

## 2019-04-01 DIAGNOSIS — K5901 Slow transit constipation: Secondary | ICD-10-CM | POA: Diagnosis not present

## 2019-04-01 DIAGNOSIS — K7469 Other cirrhosis of liver: Secondary | ICD-10-CM | POA: Diagnosis not present

## 2019-04-01 DIAGNOSIS — R1084 Generalized abdominal pain: Secondary | ICD-10-CM | POA: Diagnosis not present

## 2019-04-08 DIAGNOSIS — K5901 Slow transit constipation: Secondary | ICD-10-CM | POA: Diagnosis not present

## 2019-04-08 DIAGNOSIS — K7469 Other cirrhosis of liver: Secondary | ICD-10-CM | POA: Diagnosis not present

## 2019-04-08 DIAGNOSIS — R1084 Generalized abdominal pain: Secondary | ICD-10-CM | POA: Diagnosis not present

## 2019-04-14 ENCOUNTER — Telehealth: Payer: Self-pay | Admitting: *Deleted

## 2019-04-14 NOTE — Telephone Encounter (Signed)
A message was left,re: follow up appointment.

## 2019-04-15 DIAGNOSIS — R1084 Generalized abdominal pain: Secondary | ICD-10-CM | POA: Diagnosis not present

## 2019-04-15 DIAGNOSIS — K7469 Other cirrhosis of liver: Secondary | ICD-10-CM | POA: Diagnosis not present

## 2019-04-17 ENCOUNTER — Telehealth: Payer: Self-pay | Admitting: Family Medicine

## 2019-04-17 NOTE — Telephone Encounter (Signed)
Copied from [place] [phone]. Topic: Quick Communication - Rx Refill/Question >> Apr 17, 2019 12:09 PM Virl Axe D wrote: Medication: traMADol (ULTRAM) 50 MG tablet / spironolactone (ALDACTONE) 25 MG tablet   Has the patient contacted their pharmacy? Yes.   (Agent: If no, request that the patient contact the pharmacy for the refill.) (Agent: If yes, when and what did the pharmacy advise?)  Preferred Pharmacy (with phone number or street name): [id number] [address], [place] [phone] (Phone) [phone] (Fax)    Agent: Please be advised that RX refills may take up to 3 business days. We ask that you follow-up with your pharmacy.

## 2019-04-18 ENCOUNTER — Other Ambulatory Visit: Payer: Self-pay | Admitting: Family Medicine

## 2019-04-18 DIAGNOSIS — G894 Chronic pain syndrome: Secondary | ICD-10-CM

## 2019-04-18 DIAGNOSIS — M79605 Pain in left leg: Secondary | ICD-10-CM

## 2019-04-18 MED ORDER — TRAMADOL HCL 50 MG PO TABS: 50.0000 mg | ORAL_TABLET | Freq: Four times a day (QID) | ORAL | 0 refills | Status: DC | PRN

## 2019-04-18 NOTE — Telephone Encounter (Signed)
Tramadol request  Last written: 01/31/19 Last ov: 03/10/19 Next ov: none Contract: 08/02/19 UDS: due when she can

## 2019-05-14 ENCOUNTER — Other Ambulatory Visit: Payer: Self-pay | Admitting: Internal Medicine

## 2019-05-14 NOTE — Telephone Encounter (Signed)
I would refill as prescribed (TID) per 02/19/19 phone note unless patient states she was told to take differently

## 2019-05-14 NOTE — Telephone Encounter (Signed)
Called patient to verify how she takes her medication that she requested for a refill. Patient stated that she takes the Hydralazine every 8 hours b/c she sometimes forgets to take the 3 times a day sometimes. I told her that I will send Rx to her pharmacy with the directions as "Take 1 tablet every 8 hours".

## 2019-05-15 ENCOUNTER — Other Ambulatory Visit: Payer: Self-pay

## 2019-05-15 MED ORDER — HYDRALAZINE HCL 25 MG PO TABS: 25.0000 mg | ORAL_TABLET | Freq: Three times a day (TID) | ORAL | 1 refills | Status: DC

## 2019-06-13 ENCOUNTER — Other Ambulatory Visit: Payer: Self-pay | Admitting: Family Medicine

## 2019-06-13 DIAGNOSIS — M79605 Pain in left leg: Secondary | ICD-10-CM

## 2019-06-13 DIAGNOSIS — G894 Chronic pain syndrome: Secondary | ICD-10-CM

## 2019-06-13 MED ORDER — TRAMADOL HCL 50 MG PO TABS: 50.0000 mg | ORAL_TABLET | Freq: Four times a day (QID) | ORAL | 0 refills | Status: DC | PRN

## 2019-06-13 NOTE — Telephone Encounter (Signed)
Pt request refill   traMADol (ULTRAM) 50 MG tablet  Pt states she has a problem getting a ride to the  drug store and hopes to get this today when she picks up her other meds.  [id number] [address], [place] [phone] (Phone) [phone] (Fax)

## 2019-06-14 DIAGNOSIS — E1122 Type 2 diabetes mellitus with diabetic chronic kidney disease: Secondary | ICD-10-CM | POA: Diagnosis not present

## 2019-06-14 DIAGNOSIS — E039 Hypothyroidism, unspecified: Secondary | ICD-10-CM | POA: Diagnosis not present

## 2019-06-14 DIAGNOSIS — Z794 Long term (current) use of insulin: Secondary | ICD-10-CM | POA: Diagnosis not present

## 2019-06-14 DIAGNOSIS — N183 Chronic kidney disease, stage 3 (moderate): Secondary | ICD-10-CM | POA: Diagnosis not present

## 2019-06-16 ENCOUNTER — Ambulatory Visit: Payer: Self-pay

## 2019-06-16 ENCOUNTER — Telehealth: Payer: Self-pay | Admitting: Family Medicine

## 2019-06-16 ENCOUNTER — Other Ambulatory Visit: Payer: Self-pay

## 2019-06-16 ENCOUNTER — Encounter: Payer: Self-pay | Admitting: Family Medicine

## 2019-06-16 ENCOUNTER — Ambulatory Visit (INDEPENDENT_AMBULATORY_CARE_PROVIDER_SITE_OTHER): Payer: Medicare Other | Admitting: Family Medicine

## 2019-06-16 DIAGNOSIS — Z20822 Contact with and (suspected) exposure to covid-19: Secondary | ICD-10-CM

## 2019-06-16 DIAGNOSIS — E785 Hyperlipidemia, unspecified: Secondary | ICD-10-CM | POA: Diagnosis not present

## 2019-06-16 DIAGNOSIS — N183 Chronic kidney disease, stage 3 (moderate): Secondary | ICD-10-CM | POA: Diagnosis not present

## 2019-06-16 DIAGNOSIS — E1122 Type 2 diabetes mellitus with diabetic chronic kidney disease: Secondary | ICD-10-CM | POA: Diagnosis not present

## 2019-06-16 DIAGNOSIS — E039 Hypothyroidism, unspecified: Secondary | ICD-10-CM | POA: Diagnosis not present

## 2019-06-16 DIAGNOSIS — Z20828 Contact with and (suspected) exposure to other viral communicable diseases: Secondary | ICD-10-CM | POA: Diagnosis not present

## 2019-06-16 DIAGNOSIS — Z794 Long term (current) use of insulin: Secondary | ICD-10-CM | POA: Diagnosis not present

## 2019-06-16 NOTE — Telephone Encounter (Addendum)
Called pt and she stated her daughter [name] would be the one to speak with to schedule the appointment.  [name] was on the phone trying to get approval for herself to be tested. [place] requested [place] to call [name] (emgency contact) in 25- 30 mins  Order was placed

## 2019-06-16 NOTE — Addendum Note (Signed)
Addended by: Marin Roberts on: 06/16/2019 03:34 PM   Modules accepted: Orders

## 2019-06-16 NOTE — Telephone Encounter (Signed)
covid exposure ---- no symptoms

## 2019-06-16 NOTE — Progress Notes (Signed)
Virtual Visit via Video Note  I connected with Margaret Dyer on 06/16/19 at  3:00 PM EDT by a video enabled telemedicine application and verified that I am speaking with the correct person using two identifiers.  Location: Patient: home  Provider: office    I discussed the limitations of evaluation and management by telemedicine and the availability of in person appointments. The patient expressed understanding and agreed to proceed.  History of Present Illness: Pt was exposed to covid through her son's coworker and she would like to be tested She has no symptoms    Observations/Objective: No vitals obtained Pt is in NAD  Assessment and Plan: 1. Exposure to Covid-19 Virus To be tested Pt has no symptoms   Follow Up Instructions:    I discussed the assessment and treatment plan with the patient. The patient was provided an opportunity to ask questions and all were answered. The patient agreed with the plan and demonstrated an understanding of the instructions.   The patient was advised to call back or seek an in-person evaluation if the symptoms worsen or if the condition fails to improve as anticipated.  I provided 15 minutes of non-face-to-face time during this encounter.   Ann Held, DO

## 2019-06-16 NOTE — Assessment & Plan Note (Signed)
Pt to be tested  Pt has no symptoms

## 2019-06-16 NOTE — Telephone Encounter (Signed)
Patient called and says she was around her grandson on Saturday, sitting about 2-3 feet apart, and he was tested for covid and waiting on results due to him wrestling with a friend who tested positive. She says she would like to have a covid test. She denies any symptoms. I called the office and spoke to [place], [facility] who asked to speak to the patient, the call was connected successfully.  Answer Assessment - Initial Assessment Questions 1. CLOSE CONTACT: "Who is the person with the confirmed or suspected COVID-19 infection that you were exposed to?"     Grandson is waiting on test results 2. PLACE of CONTACT: "Where were you when you were exposed to COVID-19?" (e.g., home, school, medical waiting room; which city?)     [place], [place] 3. TYPE of CONTACT: "How much contact was there?" (e.g., sitting next to, live in same house, work in same office, same building)     Visiting the house, sitting on the porch 2-3 feet apart 4. DURATION of CONTACT: "How long were you in contact with the COVID-19 patient?" (e.g., a few seconds, passed by person, a few minutes, live with the patient)     1 hour 5. DATE of CONTACT: "When did you have contact with a COVID-19 patient?" (e.g., how many days ago)     06/14/19 6. TRAVEL: "Have you traveled out of the country recently?" If so, "When and where?"     * Also ask about out-of-state travel, since the CDC has identified some high-risk cities for community spread in the [place].     * Note: Travel becomes less relevant if there is widespread community transmission where the patient lives.     No 7. COMMUNITY SPREAD: "Are there lots of cases of COVID-19 (community spread) where you live?" (See public health department website, if unsure)       No 8. SYMPTOMS: "Do you have any symptoms?" (e.g., fever, cough, breathing difficulty)     No 9. PREGNANCY OR POSTPARTUM: "Is there any chance you are pregnant?" "When was your last menstrual period?" "Did you deliver in the  last 2 weeks?"     No 10. HIGH RISK: "Do you have any heart or lung problems? Do you have a weak immune system?" (e.g., CHF, COPD, asthma, HIV positive, chemotherapy, renal failure, diabetes mellitus, sickle cell anemia)     Heart failure, diabetes  Protocols used: CORONAVIRUS (COVID-19) EXPOSURE-A-AH

## 2019-06-17 NOTE — Telephone Encounter (Signed)
Please advise.

## 2019-06-17 NOTE — Telephone Encounter (Signed)
Thought I put the order in yesterday

## 2019-06-17 NOTE — Telephone Encounter (Signed)
Contacted pt, she was waiting for her daughter to get orders from her provider so they can be scheduled together as she is her ride. Per pt request, I called back and left vm on her phone with correct call back number when she is ready to schedule.    Forwarding to pcp's office as a Pharmacist, hospital

## 2019-06-26 ENCOUNTER — Other Ambulatory Visit: Payer: Self-pay | Admitting: Family Medicine

## 2019-06-26 DIAGNOSIS — R32 Unspecified urinary incontinence: Secondary | ICD-10-CM

## 2019-06-28 ENCOUNTER — Other Ambulatory Visit: Payer: Self-pay | Admitting: Internal Medicine

## 2019-07-01 ENCOUNTER — Telehealth: Payer: Self-pay | Admitting: Physician Assistant

## 2019-07-01 NOTE — Progress Notes (Signed)
Virtual Visit via Video Note   This visit type was conducted due to national recommendations for restrictions regarding the COVID-19 Pandemic (e.g. social distancing) in an effort to limit this patient's exposure and mitigate transmission in our community.  Due to her co-morbid illnesses, this patient is at least at moderate risk for complications without adequate follow up.  This format is felt to be most appropriate for this patient at this time.  All issues noted in this document were discussed and addressed.  A limited physical exam was performed with this format.  Please refer to the patient's chart for her consent to telehealth for [facility].   Date:  07/02/2019   ID:  Margaret Dyer, DOB May 06, 1935, MRN [id number]  Patient Location: Home Provider Location: Office  PCP:  Carollee Herter, Alferd Apa, DO  Cardiologist:  Pixie Casino, MD  Electrophysiologist:  None   Evaluation Performed:  Follow-Up Visit  Chief Complaint:  Chronic diastolic heart failure  History of Present Illness:    Margaret Dyer is a 84 y.o. female with chronic diastolic heart failure, hypertension, pulmonary hypertension, cirrhosis, DM 2, CKD stage III, anxiety/depression and tobacco abuse.  She has single kidney s/p resection for kidney cancer.  She was seen in the hospital in June 2018 with ongoing dyspnea on exertion and a 10 to 15 pound weight gain.  She had bilateral pleural effusions and elevated BNP.  Echocardiogram revealed a preserved EF of 60 to 65% with grade 2 diastolic dysfunction.  To evaluate her status, she underwent right heart catheterization on 05/18/2017 which showed severe diastolic heart failure and elevated pulmonary pressure with a component of pulmonary hypertension.  To avoid further contrast, she underwent stress test which showed no reversible ischemia.  She was last seen in clinic by Dr. Debara Pickett on 05/03/2018. She had evidence of lower extremity venous insufficiency.  Her weight at that time  was approximately 192 lbs. she was experiencing some dizziness thought to be related to dehydration.  Her Lasix was adjusted to 40 mg as needed with 20 mEq PRN.  It appears that in the interim she broke her hip and recovered at a skilled nursing facility.  She was discharged from the hospital 12/30/2018 and her Lasix had been stopped at that time.  While at the SNF, she was placed on 12.5 mg spironolactone, which Dr. Debara Pickett continued.  She presents today for her annual follow-up. Her diabetes doctor has suggested adding an ACEI to her regimen. In review of her labs, sCr on 06/14/19 was 1.12, stable. BP has been 170s/80s, but now she has decreased salt intake and now compliant on her lasix. She was tired of running to the bathroom and had decreased lasix to every other day. Since she was retaining fluid and her BP was high, she resumed taking lasix daily. She also takes 20 mEq potassium. In addition, she is still taking spironolactone 12.5 mg daily. Her labs in [place] from 06/14/19 reviewed and stable. Since she increased lasix and decreased salt, her BP is much improved - 141/59, HR 72. She is on 2L O2 at home, mostly used at night. She has trouble walking after her broken hip. She doesn't do PT any longer. She denies lower extremity edema or orthopnea. She elevates the head of her bed for hip and back comfort while watching TV and levels the bed when she wishes to go to sleep. She is very sedentary. She only walks between the bed, recliner, and bathroom. She  wakes up at noon and takes her medication between noon and 1pm. We discussed that this was probably causing her to need to use the bathroom at night. She is unable to wake up and take medication earlier.  We had a long discussion about activity and strategies to increase her activity level.   The patient does not have symptoms concerning for COVID-19 infection (fever, chills, cough, or new shortness of breath).    Past Medical History:  Diagnosis  Date   Asthma    Blood transfusion    Chronic diastolic CHF (congestive heart failure) (HCC)    CKD (chronic kidney disease), stage III (HCC)    Coronary artery calcification seen on CT scan    Diabetes mellitus    Goiter    Hyperlipidemia    Hypertension    Hypothyroidism    Liver cirrhosis (HCC)    NASH (nonalcoholic steatohepatitis)    Obesity    Osteopenia    Pulmonary hypertension (HCC)    Renal cell carcinoma    a. prior nephrectomy.   Right heart failure (HCC)    Thrombocytopenia ([place])    Past Surgical History:  Procedure Laterality Date   ABDOMINAL HYSTERECTOMY     APPENDECTOMY     HEMORRHOID SURGERY     INTRAMEDULLARY (IM) NAIL INTERTROCHANTERIC Left 12/25/2018   Procedure: INTRAMEDULLARY (IM) NAIL INTERTROCHANTRIC;  Surgeon: Nicholes Stairs, MD;  Location: [facility];  Service: Orthopedics;  Laterality: Left;   KNEE SURGERY     NEPHRECTOMY  09.2000     Current Meds  Medication Sig   aspirin 325 MG tablet Take 1 tablet (325 mg total) by mouth daily. (Patient taking differently: Take 81 mg by mouth daily. )   docusate sodium (COLACE) 100 MG capsule Take 1 capsule (100 mg total) by mouth 2 (two) times daily.   ezetimibe-simvastatin (VYTORIN) 10-40 MG tablet Take 1 tablet by mouth daily.   furosemide (LASIX) 40 MG tablet TAKE 1 TABLET BY MOUTH EVERY DAY AS NEEDED FOR WEIGHT GAIN OR SWELLING   hydrALAZINE (APRESOLINE) 25 MG tablet Take 1 tablet (25 mg total) by mouth every 8 (eight) hours. Take 1 tablet in the morning and half tablet in the evenings   insulin glargine (LANTUS) 100 UNIT/ML injection Inject 0.1 mLs (10 Units total) into the skin at bedtime. (Patient taking differently: Inject 25 Units into the skin at bedtime. )   Insulin Glulisine (APIDRA SOLOSTAR) 100 UNIT/ML Solostar Pen Inject 10 Units into the skin 3 (three) times daily before meals.   levothyroxine (SYNTHROID, LEVOTHROID) 25 MCG tablet take 1 tablet by mouth daily  (Patient taking differently: Take 25 mcg by mouth See admin instructions. Take 25 mcg by mouth before breakfast on Mon/Tues/Wed/Thurs/Fri/Sat)   metoprolol tartrate (LOPRESSOR) 25 MG tablet TAKE 1 TABLET BY MOUTH TWICE DAILY   Multiple Vitamins-Minerals (CENTRUM SILVER ULTRA WOMENS PO) Take by mouth daily.   oxybutynin (DITROPAN) 5 MG tablet TAKE 1 TABLET BY MOUTH EVERY MORNING   polyethylene glycol powder (GLYCOLAX/MIRALAX) powder Take 17 g by mouth daily as needed for mild constipation.   potassium chloride (KLOR-CON) 20 MEQ packet Take 20 mEq by mouth 2 (two) times daily.   traMADol (ULTRAM) 50 MG tablet Take 1 tablet (50 mg total) by mouth every 6 (six) hours as needed for moderate pain.     Allergies:   Other, Cefuroxime axetil, and Ciprofloxacin   Social History   Tobacco Use   Smoking status: Former Smoker   Smokeless tobacco: Never Used  Tobacco comment: 05/30/18 none in 40 years  Substance Use Topics   Alcohol use: No    Alcohol/week: 0.0 standard drinks   Drug use: No     Family Hx: The patient's family history includes COPD in her father; Cancer in her father and sister; Congestive Heart Failure in her father; Coronary artery disease in an other family member; Diabetes in an other family member; Hyperlipidemia in an other family member; Hypertension in an other family member; Kidney cancer in her father. There is no history of Rheumatologic disease.  ROS:   Please see the history of present illness.     All other systems reviewed and are negative.   Prior CV studies:   The following studies were reviewed today:  LE duplex 01/31/19: No DVT  Carotid duplex 07/03/18: Nonobstructive disease, bilateral  Right heart cath 05/18/17:  Hemodynamic findings consistent with moderate-severe pulmonary hypertension.   Severe diastolic heart failure with PCWP of 32 mmHg. Noted large V wave on wedge waveform. Appears to be mostly pulmonary venous congestion, however  there is a transpulmonary gradient of 12 mmHg suggesting there is a component of pulmonary disease as well. The presence of a very large aching V wave in PCWP waveforms would suggest mitral regurgitation.  The patient will return to her  nursing floor for continued management with IV diuresis.   I will increase the Lasix dose to 80 mg twice a day  Labs/Other Tests and Data Reviewed:    EKG:  An ECG dated 12/25/18 was personally reviewed today and demonstrated:  sinus rhythm HR 63, nonspecific T wave abnormalities (T wave flattening III, V4, V6)  Recent Labs: 03/19/2019: ALT 22; BUN 27; Creatinine, Ser 1.13; Hemoglobin 14.3; Platelets 98.0; Potassium 3.8; Sodium 140   Recent Lipid Panel Lab Results  Component Value Date/Time   CHOL 200 03/19/2019 02:37 PM   TRIG 125.0 03/19/2019 02:37 PM   HDL 40.50 03/19/2019 02:37 PM   CHOLHDL 5 03/19/2019 02:37 PM   LDLCALC 134 (H) 03/19/2019 02:37 PM   LDLDIRECT 89.0 01/19/2015 04:59 PM    Wt Readings from Last 3 Encounters:  07/02/19 184 lb (83.5 kg)  01/31/19 185 lb (83.9 kg)  12/25/18 189 lb 9.5 oz (86 kg)     Objective:    Vital Signs:  BP (!) 141/59    Pulse 72    Ht 5' 3.5" (1.613 m)    Wt 184 lb (83.5 kg)    LMP  (LMP Unknown)    BMI 32.08 kg/m    VITAL SIGNS:  reviewed GEN:  no acute distress EYES:  sclerae anicteric, EOMI - Extraocular Movements Intact RESPIRATORY:  normal respiratory effort, symmetric expansion CARDIOVASCULAR:  no peripheral edema SKIN:  no rash, lesions or ulcers. MUSCULOSKELETAL:  no obvious deformities. NEURO:  alert and oriented x 3, no obvious focal deficit PSYCH:  normal affect  ASSESSMENT & PLAN:    Chronic diastolic heart failure Chronic respiratory failure on home O2 Single kidney s/p resection for kidney cancer Hypertension She is very sedentary. I believe her heart failure is at baseline. She takes 40 mg lasix and 20 mEq K every day. She is now more adherent to a low sodium diet. Her  pressures are much improved, but still mildly elevated. I will start lisinopril for pressure and renal protection. Will discuss with Dr. Debara Pickett. Continue BB, spironalactone, lasix/K, and hydralazine.   Recent hip fracture and surgery Recovered at SNF She states she was placed on 325 mg ASA for DVT  PPX by ortho. It has been 7 months since her surgery and I see no other indication for full dose ASA. I will decrease this to 81 mg daily. She is very sedentary. We discussed increasing her activity level.   Hyperlipidemia Continue vytorin   COVID-19 Education: The signs and symptoms of COVID-19 were discussed with the patient and how to seek care for testing (follow up with PCP or arrange E-visit).  The importance of social distancing was discussed today.  Time:   Today, I have spent 32 minutes with the patient with telehealth technology discussing the above problems.     Medication Adjustments/Labs and Tests Ordered: Current medicines are reviewed at length with the patient today.  Concerns regarding medicines are outlined above.   Tests Ordered: Orders Placed This Encounter  Procedures   Basic metabolic panel    Medication Changes: Meds ordered this encounter  Medications   lisinopril (ZESTRIL) 5 MG tablet    Sig: Take 1 tablet (5 mg total) by mouth daily.    Dispense:  90 tablet    Refill:  3    Follow Up:  Virtual Visit or In Person in 1 year(s)  Signed, Ledora Bottcher, PA  07/02/2019 12:34 PM    [facility]

## 2019-07-01 NOTE — Telephone Encounter (Signed)
LVM, reminding pt of her appt and  Asking pt to call back to give consent for Virtual Visit.

## 2019-07-02 ENCOUNTER — Emergency Department (HOSPITAL_COMMUNITY): Payer: Medicare Other

## 2019-07-02 ENCOUNTER — Encounter (HOSPITAL_COMMUNITY): Payer: Self-pay | Admitting: Emergency Medicine

## 2019-07-02 ENCOUNTER — Encounter: Payer: Self-pay | Admitting: Physician Assistant

## 2019-07-02 ENCOUNTER — Other Ambulatory Visit: Payer: Self-pay

## 2019-07-02 ENCOUNTER — Inpatient Hospital Stay (HOSPITAL_COMMUNITY)
Admission: EM | Admit: 2019-07-02 | Discharge: 2019-07-06 | DRG: 872 | Disposition: A | Discharge status: Home Health | Payer: Medicare Other | Attending: Internal Medicine | Admitting: Internal Medicine

## 2019-07-02 ENCOUNTER — Telehealth (INDEPENDENT_AMBULATORY_CARE_PROVIDER_SITE_OTHER): Payer: Medicare Other | Admitting: Physician Assistant

## 2019-07-02 ENCOUNTER — Telehealth: Payer: Self-pay | Admitting: Physician Assistant

## 2019-07-02 VITALS — BP 141/59 | HR 72 | Ht 63.5 in | Wt 184.0 lb

## 2019-07-02 DIAGNOSIS — I10 Essential (primary) hypertension: Secondary | ICD-10-CM

## 2019-07-02 DIAGNOSIS — Z8249 Family history of ischemic heart disease and other diseases of the circulatory system: Secondary | ICD-10-CM

## 2019-07-02 DIAGNOSIS — J9611 Chronic respiratory failure with hypoxia: Secondary | ICD-10-CM | POA: Diagnosis present

## 2019-07-02 DIAGNOSIS — Z794 Long term (current) use of insulin: Secondary | ICD-10-CM

## 2019-07-02 DIAGNOSIS — A4151 Sepsis due to Escherichia coli [E. coli]: Secondary | ICD-10-CM | POA: Diagnosis not present

## 2019-07-02 DIAGNOSIS — J9610 Chronic respiratory failure, unspecified whether with hypoxia or hypercapnia: Secondary | ICD-10-CM | POA: Diagnosis not present

## 2019-07-02 DIAGNOSIS — R319 Hematuria, unspecified: Secondary | ICD-10-CM

## 2019-07-02 DIAGNOSIS — I130 Hypertensive heart and chronic kidney disease with heart failure and stage 1 through stage 4 chronic kidney disease, or unspecified chronic kidney disease: Secondary | ICD-10-CM | POA: Diagnosis not present

## 2019-07-02 DIAGNOSIS — E119 Type 2 diabetes mellitus without complications: Secondary | ICD-10-CM

## 2019-07-02 DIAGNOSIS — I5082 Biventricular heart failure: Secondary | ICD-10-CM | POA: Diagnosis present

## 2019-07-02 DIAGNOSIS — E785 Hyperlipidemia, unspecified: Secondary | ICD-10-CM

## 2019-07-02 DIAGNOSIS — N183 Chronic kidney disease, stage 3 unspecified: Secondary | ICD-10-CM

## 2019-07-02 DIAGNOSIS — R7881 Bacteremia: Secondary | ICD-10-CM | POA: Diagnosis present

## 2019-07-02 DIAGNOSIS — R0902 Hypoxemia: Secondary | ICD-10-CM | POA: Diagnosis not present

## 2019-07-02 DIAGNOSIS — Z85528 Personal history of other malignant neoplasm of kidney: Secondary | ICD-10-CM

## 2019-07-02 DIAGNOSIS — J45909 Unspecified asthma, uncomplicated: Secondary | ICD-10-CM | POA: Diagnosis present

## 2019-07-02 DIAGNOSIS — K7469 Other cirrhosis of liver: Secondary | ICD-10-CM | POA: Diagnosis present

## 2019-07-02 DIAGNOSIS — Z87891 Personal history of nicotine dependence: Secondary | ICD-10-CM

## 2019-07-02 DIAGNOSIS — A059 Bacterial foodborne intoxication, unspecified: Secondary | ICD-10-CM | POA: Diagnosis present

## 2019-07-02 DIAGNOSIS — B9620 Unspecified Escherichia coli [E. coli] as the cause of diseases classified elsewhere: Secondary | ICD-10-CM | POA: Diagnosis present

## 2019-07-02 DIAGNOSIS — I5032 Chronic diastolic (congestive) heart failure: Secondary | ICD-10-CM

## 2019-07-02 DIAGNOSIS — M1990 Unspecified osteoarthritis, unspecified site: Secondary | ICD-10-CM | POA: Diagnosis present

## 2019-07-02 DIAGNOSIS — Z825 Family history of asthma and other chronic lower respiratory diseases: Secondary | ICD-10-CM

## 2019-07-02 DIAGNOSIS — Z79890 Hormone replacement therapy (postmenopausal): Secondary | ICD-10-CM

## 2019-07-02 DIAGNOSIS — N390 Urinary tract infection, site not specified: Secondary | ICD-10-CM | POA: Diagnosis present

## 2019-07-02 DIAGNOSIS — Z7982 Long term (current) use of aspirin: Secondary | ICD-10-CM

## 2019-07-02 DIAGNOSIS — Z881 Allergy status to other antibiotic agents status: Secondary | ICD-10-CM

## 2019-07-02 DIAGNOSIS — R410 Disorientation, unspecified: Secondary | ICD-10-CM | POA: Diagnosis not present

## 2019-07-02 DIAGNOSIS — Z905 Acquired absence of kidney: Secondary | ICD-10-CM

## 2019-07-02 DIAGNOSIS — M8580 Other specified disorders of bone density and structure, unspecified site: Secondary | ICD-10-CM | POA: Diagnosis present

## 2019-07-02 DIAGNOSIS — K7581 Nonalcoholic steatohepatitis (NASH): Secondary | ICD-10-CM | POA: Diagnosis present

## 2019-07-02 DIAGNOSIS — K7460 Unspecified cirrhosis of liver: Secondary | ICD-10-CM | POA: Diagnosis not present

## 2019-07-02 DIAGNOSIS — Z20828 Contact with and (suspected) exposure to other viral communicable diseases: Secondary | ICD-10-CM | POA: Diagnosis not present

## 2019-07-02 DIAGNOSIS — R000 Tachycardia, unspecified: Secondary | ICD-10-CM | POA: Diagnosis not present

## 2019-07-02 DIAGNOSIS — R531 Weakness: Secondary | ICD-10-CM | POA: Diagnosis not present

## 2019-07-02 DIAGNOSIS — D696 Thrombocytopenia, unspecified: Secondary | ICD-10-CM | POA: Diagnosis present

## 2019-07-02 DIAGNOSIS — I959 Hypotension, unspecified: Secondary | ICD-10-CM | POA: Diagnosis not present

## 2019-07-02 DIAGNOSIS — N200 Calculus of kidney: Secondary | ICD-10-CM

## 2019-07-02 DIAGNOSIS — E039 Hypothyroidism, unspecified: Secondary | ICD-10-CM | POA: Diagnosis present

## 2019-07-02 DIAGNOSIS — Z9981 Dependence on supplemental oxygen: Secondary | ICD-10-CM

## 2019-07-02 DIAGNOSIS — Z8051 Family history of malignant neoplasm of kidney: Secondary | ICD-10-CM

## 2019-07-02 LAB — CBC WITH DIFFERENTIAL/PLATELET
Abs Immature Granulocytes: 0.08 10*3/uL — ABNORMAL HIGH (ref 0.00–0.07)
Basophils Absolute: 0 10*3/uL (ref 0.0–0.1)
Basophils Relative: 0 %
Eosinophils Absolute: 0 10*3/uL (ref 0.0–0.5)
Eosinophils Relative: 0 %
HCT: 42.9 % (ref 36.0–46.0)
Hemoglobin: 14.2 g/dL (ref 12.0–15.0)
Immature Granulocytes: 1 %
Lymphocytes Relative: 2 %
Lymphs Abs: 0.2 10*3/uL — ABNORMAL LOW (ref 0.7–4.0)
MCH: 30.1 pg (ref 26.0–34.0)
MCHC: 33.1 g/dL (ref 30.0–36.0)
MCV: 91.1 fL (ref 80.0–100.0)
Monocytes Absolute: 0.2 10*3/uL (ref 0.1–1.0)
Monocytes Relative: 3 %
Neutro Abs: 6.5 10*3/uL (ref 1.7–7.7)
Neutrophils Relative %: 94 %
Platelets: 89 10*3/uL — ABNORMAL LOW (ref 150–400)
RBC: 4.71 MIL/uL (ref 3.87–5.11)
RDW: 13.5 % (ref 11.5–15.5)
WBC: 7 10*3/uL (ref 4.0–10.5)
nRBC: 0 % (ref 0.0–0.2)

## 2019-07-02 LAB — COMPREHENSIVE METABOLIC PANEL
ALT: 32 U/L (ref 0–44)
AST: 64 U/L — ABNORMAL HIGH (ref 15–41)
Albumin: 3.5 g/dL (ref 3.5–5.0)
Alkaline Phosphatase: 62 U/L (ref 38–126)
Anion gap: 13 (ref 5–15)
BUN: 32 mg/dL — ABNORMAL HIGH (ref 8–23)
CO2: 20 mmol/L — ABNORMAL LOW (ref 22–32)
Calcium: 9.1 mg/dL (ref 8.9–10.3)
Chloride: 104 mmol/L (ref 98–111)
Creatinine, Ser: 1.14 mg/dL — ABNORMAL HIGH (ref 0.44–1.00)
GFR calc Af Amer: 52 mL/min — ABNORMAL LOW (ref >60)
GFR calc non Af Amer: 45 mL/min — ABNORMAL LOW (ref >60)
Glucose, Bld: 197 mg/dL — ABNORMAL HIGH (ref 70–99)
Potassium: 5.4 mmol/L — ABNORMAL HIGH (ref 3.5–5.1)
Sodium: 137 mmol/L (ref 135–145)
Total Bilirubin: 1.6 mg/dL — ABNORMAL HIGH (ref 0.3–1.2)
Total Protein: 5.9 g/dL — ABNORMAL LOW (ref 6.5–8.1)

## 2019-07-02 LAB — URINALYSIS, ROUTINE W REFLEX MICROSCOPIC
Bilirubin Urine: NEGATIVE
Glucose, UA: 150 mg/dL — AB
Ketones, ur: NEGATIVE mg/dL
Nitrite: NEGATIVE
Protein, ur: 100 mg/dL — AB
RBC / HPF: >50 RBC/hpf — ABNORMAL HIGH (ref 0–5)
Specific Gravity, Urine: 1.016 (ref 1.005–1.030)
pH: 5 (ref 5.0–8.0)

## 2019-07-02 MED ORDER — SODIUM CHLORIDE 0.9 % IV BOLUS
1000.0000 mL | Freq: Once | INTRAVENOUS | Status: AC
  Administered 2019-07-02: 1000 mL via INTRAVENOUS

## 2019-07-02 MED ORDER — ACETAMINOPHEN 325 MG PO TABS
650.0000 mg | ORAL_TABLET | Freq: Once | ORAL | Status: AC
  Administered 2019-07-02: 22:00:00 650 mg via ORAL
  Filled 2019-07-02: qty 2

## 2019-07-02 MED ORDER — SODIUM CHLORIDE 0.9 % IV SOLN
1.0000 g | Freq: Once | INTRAVENOUS | Status: AC
  Administered 2019-07-02: 22:00:00 1 g via INTRAVENOUS
  Filled 2019-07-02: qty 10

## 2019-07-02 MED ORDER — LISINOPRIL 5 MG PO TABS: 5.0000 mg | ORAL_TABLET | Freq: Every day | ORAL | 3 refills | Status: DC

## 2019-07-02 NOTE — H&P (Signed)
History and Physical    Margaret Dyer YHT:[id number] DOB: 02-Jul-1935 DOA: 07/02/2019  PCP: Margaret Held, DO Patient coming from: Home  Chief Complaint: UTI symptoms  HPI: Margaret Dyer is a 84 y.o. female with medical history significant of asthma, chronic diastolic congestive heart failure, on home oxygen, CKD stage III, type 2 diabetes, hypertension, hyperlipidemia, hypothyroidism, coronary artery calcification seen on CT, liver cirrhosis, [place], pulmonary hypertension, renal cell carcinoma status post nephrectomy, thrombocytopenia presenting to the hospital for evaluation of UTI symptoms.  Patient reports 5-day history of dysuria.  Reports having chronic urinary frequency as she takes a diuretic.  Reports having chills at home but is not sure if she had a fever.  Denies any flank pain.  States she felt nauseous after eating something with pimento cheese before coming into the hospital today.  She vomited just a little soon after eating.  Denies any abdominal pain or diarrhea.  She continues to feel nauseous.  No other complaints.  ED Course: Temperature 101 F.  Not tachycardic or tachypneic.  Not hypotensive.  No leukocytosis.  Platelet count 89,000, chronically low and at baseline.  Potassium 5.4, specimen hemolyzed.  BUN 32.  Creatinine 1.1, baseline 1.9-1.1.  UA with small amount of leukocytes, greater than 50 RBCs, 21-50 WBCs, and many bacteria.  Urine culture pending.  Blood culture x2 pending.  COVID-19 rapid test pending.  Chest x-ray showing no acute cardiopulmonary process.  CT renal stone study showing possible punctate 1 mm stone in the distal right ureter about a centimeter proximal to the right UVJ but no significant right hydronephrosis.  Status post left nephrectomy. ED provider discussed the case with Dr. Alinda Money from urology who reviewed the CT images and is unsure if there is actually a stone there.  He recommended hospitalist admission for observation.  Patient received 1 L  IV fluid bolus, Tylenol, and ceftriaxone.  Review of Systems:  All systems reviewed and apart from history of presenting illness, are negative.  Past Medical History:  Diagnosis Date   Asthma    Blood transfusion    Chronic diastolic CHF (congestive heart failure) (HCC)    CKD (chronic kidney disease), stage III (HCC)    Coronary artery calcification seen on CT scan    Diabetes mellitus    Goiter    Hyperlipidemia    Hypertension    Hypothyroidism    Liver cirrhosis (HCC)    NASH (nonalcoholic steatohepatitis)    Obesity    Osteopenia    Pulmonary hypertension (HCC)    Renal cell carcinoma    a. prior nephrectomy.   Right heart failure (HCC)    Thrombocytopenia ([place])     Past Surgical History:  Procedure Laterality Date   ABDOMINAL HYSTERECTOMY     APPENDECTOMY     HEMORRHOID SURGERY     INTRAMEDULLARY (IM) NAIL INTERTROCHANTERIC Left 12/25/2018   Procedure: INTRAMEDULLARY (IM) NAIL INTERTROCHANTRIC;  Surgeon: Nicholes Stairs, MD;  Location: [facility];  Service: Orthopedics;  Laterality: Left;   KNEE SURGERY     NEPHRECTOMY  09.2000     reports that she has quit smoking. She has never used smokeless tobacco. She reports that she does not drink alcohol or use drugs.  Allergies  Allergen Reactions   Other Other (See Comments)    Patient has hemorrhaged at least 4 times in her lifetime   Cefuroxime Axetil Nausea Only    Upset stomach    Ciprofloxacin Nausea Only    Upset  stomach    Family History  Problem Relation Age of Onset   Cancer Sister        bladder   Kidney cancer Father    Cancer Father        renal   COPD Father    Congestive Heart Failure Father    Coronary artery disease Other    Diabetes Other    Hyperlipidemia Other    Hypertension Other    Rheumatologic disease Neg Hx     Prior to Admission medications   Medication Sig Start Date End Date Taking? Authorizing Provider  aspirin EC 81 MG tablet Take  325 mg by mouth daily.    Yes [provider]  docusate sodium (COLACE) 100 MG capsule Take 1 capsule (100 mg total) by mouth 2 (two) times daily. Patient taking differently: Take 100 mg by mouth See admin instructions. Take 1 tablet every day can take a second tablet as needed for constipation 12/30/18  Yes Bonnell Public, MD  ezetimibe-simvastatin (VYTORIN) 10-40 MG tablet Take 1 tablet by mouth daily.   Yes [provider]  furosemide (LASIX) 40 MG tablet TAKE 1 TABLET BY MOUTH EVERY DAY AS NEEDED FOR WEIGHT GAIN OR SWELLING Patient taking differently: Take 40 mg by mouth daily.  06/30/19  Yes Hilty, Nadean Corwin, MD  hydrALAZINE (APRESOLINE) 25 MG tablet Take 1 tablet (25 mg total) by mouth every 8 (eight) hours. Take 1 tablet in the morning and half tablet in the evenings Patient taking differently: Take 25 mg by mouth 2 (two) times a day.  05/15/19  Yes Hilty, Nadean Corwin, MD  insulin glargine (LANTUS) 100 UNIT/ML injection Inject 0.1 mLs (10 Units total) into the skin at bedtime. Patient taking differently: Inject 25 Units into the skin at bedtime.  12/30/18  Yes Bonnell Public, MD  Insulin Glulisine (APIDRA SOLOSTAR) 100 UNIT/ML Solostar Pen Inject 10 Units into the skin 3 (three) times daily before meals.   Yes [provider]  levothyroxine (SYNTHROID, LEVOTHROID) 25 MCG tablet take 1 tablet by mouth daily Patient taking differently: Take 25 mcg by mouth See admin instructions. Take 25 mcg by mouth before breakfast on Mon/Tues/Wed/Thurs/Fri/Sat 07/13/17  Yes Roma Schanz R, DO  lisinopril (ZESTRIL) 5 MG tablet Take 1 tablet (5 mg total) by mouth daily. 07/02/19 09/30/19 Yes Duke, Tami Lin, PA  metoprolol tartrate (LOPRESSOR) 25 MG tablet TAKE 1 TABLET BY MOUTH TWICE DAILY Patient taking differently: Take 25 mg by mouth 2 (two) times daily.  03/04/19  Yes Margaret Held, DO  Multiple Vitamins-Minerals (CENTRUM SILVER ULTRA WOMENS PO) Take 1 tablet  by mouth at bedtime.    Yes [provider]  oxybutynin (DITROPAN) 5 MG tablet TAKE 1 TABLET BY MOUTH EVERY MORNING Patient taking differently: Take 5 mg by mouth daily.  06/26/19  Yes Roma Schanz R, DO  polyethylene glycol powder (GLYCOLAX/MIRALAX) powder Take 17 g by mouth daily as needed for mild constipation.   Yes [provider]  potassium chloride SA (K-DUR,KLOR-CON) 20 MEQ tablet Take 1 tablet (20 mEq total) by mouth daily as needed (on the days you take Lasix). Patient taking differently: Take 20 mEq by mouth at bedtime.  02/24/19 07/02/19 Yes Hilty, Nadean Corwin, MD  spironolactone (ALDACTONE) 25 MG tablet Take 0.5 tablets (12.5 mg total) by mouth daily. 03/26/19 07/02/19 Yes Hilty, Nadean Corwin, MD  traMADol (ULTRAM) 50 MG tablet Take 1 tablet (50 mg total) by mouth every 6 (six) hours  as needed for moderate pain. 06/13/19  Yes Shelda Pal, DO  gabapentin (NEURONTIN) 100 MG capsule Take 1 capsule (100 mg total) by mouth 3 (three) times daily. Patient not taking: Reported on 07/02/2019 12/30/18   Bonnell Public, MD    Physical Exam: Vitals:   07/03/19 0130 07/03/19 0145 07/03/19 0200 07/03/19 0215  BP: (!) 116/46 (!) 116/50 (!) 117/48 (!) 128/54  Pulse: 73 74 69 78  Resp: (!) _0 Temp:      TempSrc:      SpO2: 91% 92% 92% 93%  Weight:      Height:        Physical Exam  Constitutional: She is oriented to person, place, and time. No distress.  HENT:  Head: Normocephalic.  Dry mucous membranes  Eyes: Right eye exhibits no discharge. Left eye exhibits no discharge.  Neck: Neck supple.  Cardiovascular: Normal rate, regular rhythm and intact distal pulses.  Pulmonary/Chest: Effort normal and breath sounds normal. No respiratory distress. She has no wheezes. She has no rales.  Abdominal: Soft. Bowel sounds are normal. She exhibits no distension. There is no abdominal tenderness. There is no guarding.  Musculoskeletal:        General: No  edema.     Comments: No CVA tenderness  Neurological: She is alert and oriented to person, place, and time.  Skin: Skin is warm and dry. She is not diaphoretic.     Labs on Admission: I have personally reviewed following labs and imaging studies  CBC: Recent Labs  Lab 07/02/19 2107  WBC 7.0  NEUTROABS 6.5  HGB 14.2  HCT 42.9  MCV 91.1  PLT 89*   Basic Metabolic Panel: Recent Labs  Lab 07/02/19 2107  NA 137  K 5.4*  CL 104  CO2 20*  GLUCOSE 197*  BUN 32*  CREATININE 1.14*  CALCIUM 9.1   GFR: Estimated Creatinine Clearance: 39.8 mL/min (A) (by C-G formula based on SCr of 1.14 mg/dL (H)). Liver Function Tests: Recent Labs  Lab 07/02/19 2107  AST 64*  ALT 32  ALKPHOS 62  BILITOT 1.6*  PROT 5.9*  ALBUMIN 3.5   No results for input(s): LIPASE, AMYLASE in the last 168 hours. No results for input(s): AMMONIA in the last 168 hours. Coagulation Profile: No results for input(s): INR, PROTIME in the last 168 hours. Cardiac Enzymes: No results for input(s): CKTOTAL, CKMB, CKMBINDEX, TROPONINI in the last 168 hours. BNP (last 3 results) No results for input(s): PROBNP in the last 8760 hours. HbA1C: No results for input(s): HGBA1C in the last 72 hours. CBG: Recent Labs  Lab 07/03/19 0206  GLUCAP 267*   Lipid Profile: No results for input(s): CHOL, HDL, LDLCALC, TRIG, CHOLHDL, LDLDIRECT in the last 72 hours. Thyroid Function Tests: No results for input(s): TSH, T4TOTAL, FREET4, T3FREE, THYROIDAB in the last 72 hours. Anemia Panel: No results for input(s): VITAMINB12, FOLATE, FERRITIN, TIBC, IRON, RETICCTPCT in the last 72 hours. Urine analysis:    Component Value Date/Time   COLORURINE YELLOW 07/02/2019 2107   APPEARANCEUR HAZY (A) 07/02/2019 2107   LABSPEC 1.016 07/02/2019 2107   PHURINE 5.0 07/02/2019 2107   GLUCOSEU 150 (A) 07/02/2019 2107   HGBUR MODERATE (A) 07/02/2019 2107   HGBUR large 02/17/2011 1432   BILIRUBINUR NEGATIVE 07/02/2019 2107    BILIRUBINUR neg 01/31/2019 [place] 07/02/2019 2107   PROTEINUR 100 (A) 07/02/2019 2107   UROBILINOGEN 0.2 01/31/2019 1624   UROBILINOGEN 0.2 02/17/2011 1432  NITRITE NEGATIVE 07/02/2019 2107   LEUKOCYTESUR SMALL (A) 07/02/2019 2107    Radiological Exams on Admission: Dg Chest Portable 1 View  Result Date: 07/02/2019 CLINICAL DATA:  Incontinence and confusion EXAM: PORTABLE CHEST 1 VIEW COMPARISON:  December 28, 2018 FINDINGS: There is cardiomegaly. Mildly increased chronic interstitial lung changes seen at both bases. No pleural effusion. Aortic knob calcification. No acute osseous findings. IMPRESSION: No acute cardiopulmonary process. Electronically Signed   By: Prudencio Pair M.D.   On: 07/02/2019 22:23   Ct Renal Stone Study  Result Date: 07/02/2019 CLINICAL DATA:  Right flank pain EXAM: CT ABDOMEN AND PELVIS WITHOUT CONTRAST TECHNIQUE: Multidetector CT imaging of the abdomen and pelvis was performed following the standard protocol without IV contrast. COMPARISON:  CT 03/25/2019, 07/23/2012 FINDINGS: Lower chest: Lung bases demonstrate no acute consolidation or effusion. Trace pericardial effusion. Coronary vascular calcification. Hepatobiliary: Nodular liver contour consistent with cirrhosis. Dilated appearing gallbladder without calcified stone. No biliary dilatation Pancreas: Unremarkable. No pancreatic ductal dilatation or surrounding inflammatory changes. Spleen: Enlarged with craniocaudad measurement of 14.2 cm. Adrenals/Urinary Tract: Right adrenal gland 12.9 mm nodule. 2.6 cm oblong left adrenal gland nodule, no change. Cortical scarring mid to upper right kidney. Status post left nephrectomy. No significant hydronephrosis. Possible punctate 1 mm stone in the distal right ureter about a cm proximal to the right UVJ, series 3, image number 78. Stomach/Bowel: Stomach is nonenlarged. No dilated small bowel. No colon wall thickening. Status post appendectomy.  Vascular/Lymphatic: Extensive aortic atherosclerosis. No aneurysmal dilatation. No significantly enlarged lymph nodes. Reproductive: Status post hysterectomy. No adnexal masses. Other: Negative for free air or free fluid. Small fat in the umbilical region Musculoskeletal: Trace anterolisthesis L4 on L5. Multiple level degenerative change. No acute or suspicious osseous abnormality IMPRESSION: 1. Possible punctate 1 mm stone distal right ureter about a cm proximal to the right UVJ but no significant right hydronephrosis 2. Status post left nephrectomy 3. Cirrhosis of the liver.  Borderline to mild splenomegaly 4. Stable bilateral adrenal gland nodules Electronically Signed   By: Donavan Foil M.D.   On: 07/02/2019 23:27    Assessment/Plan Principal Problem:   UTI (urinary tract infection) Active Problems:   Hyperlipidemia   Essential hypertension   Diabetes mellitus type 2, insulin dependent ([place])   Food poisoning   Acute complicated UTI Patient has 1 kidney, history of left nephrectomy.  Temperature 101 F.  No leukocytosis.  No signs of sepsis. UA with small amount of leukocytes, greater than 50 RBCs, 21-50 WBCs, and many bacteria. CT renal stone study showing possible punctate 1 mm stone in the distal right ureter about a centimeter proximal to the right UVJ but no significant right hydronephrosis.  ED provider discussed the case with Dr. Alinda Money from urology who reviewed the CT images and is unsure if there is actually a stone there.  Recommending observation to see if patient develops signs of renal failure or signs concerning for obstruction.  Renal function currently at baseline, creatinine 1.1. -Ceftriaxone -Continue to monitor renal function, white count -Urine culture pending -Blood culture x2 pending -Tylenol PRN  Food poisoning, possibly related to staph aureus toxin Patient reports 1 episode of vomiting after eating a food item with pimento cheese before arriving to the hospital  today.  She continues to feel nauseous.  No abdominal pain or diarrhea.  Abdominal exam benign.  She has a fever in the setting of UTI.  No leukocytosis. -IV fluid hydration -Antiemetic -Continue to monitor  Insulin-dependent type 2  diabetes -Sliding scale insulin and CBG checks. -Continue home Lantus 25 units at bedtime  Hyperlipidemia -Continue home Vytorin  Hypertension -Continue home hydralazine, metoprolol  Hypothyroidism -Continue home Synthroid  DVT prophylaxis: Lovenox Code Status: Patient wishes to be full code. Family Communication: No family available at this time. Disposition Plan: Anticipate discharge after clinical improvement. Consults called: None Admission status: It is my clinical opinion that referral for OBSERVATION is reasonable and necessary in this patient based on the above information provided. The aforementioned taken together are felt to place the patient at high risk for further clinical deterioration. However it is anticipated that the patient may be medically stable for discharge from the hospital within 24 to 48 hours.  The medical decision making on this patient was of high complexity and the patient is at high risk for clinical deterioration, therefore this is a level 3 visit.  Margaret Leff MD [facility] Pager [phone]  If 7PM-7AM, please contact night-coverage [email or web address] Password [email or web address]  07/03/2019, 2:23 AM

## 2019-07-02 NOTE — Patient Instructions (Signed)
Medication Instructions:  START Lisinopril 5 mg daily.  If you need a refill on your cardiac medications before your next appointment, please call your pharmacy.   Lab work: Your physician recommends that you return for lab work in 2 weeks (after starting lisinopril)--BMET (to check kidney function)  If you have labs (blood work) drawn today and your tests are completely normal, you will receive your results only by: Marland Kitchen MyChart Message (if you have MyChart) OR . A paper copy in the mail If you have any lab test that is abnormal or we need to change your treatment, we will call you to review the results.   Follow-Up: At [facility], you and your health needs are our priority.  As part of our continuing mission to provide you with exceptional heart care, we have created designated Provider Care Teams.  These Care Teams include your primary Cardiologist (physician) and Advanced Practice Providers (APPs -  Physician Assistants and Nurse Practitioners) who all work together to provide you with the care you need, when you need it. You will need a follow up appointment in 12 months.  Please call our office 2 months in advance to schedule this appointment.  You may see Pixie Casino, MD or one of the following Advanced Practice Providers on your designated Care Team: [place], [place] . Fabian Sharp, PA-C  Any Other Special Instructions Will Be Listed Below (If Applicable). None

## 2019-07-02 NOTE — Telephone Encounter (Signed)
LM to let pt know per Doreene Adas, PA to have blood work done one week after starting lisinopril instead of 2 weeks. Asked pt to call back if any questions.

## 2019-07-02 NOTE — Addendum Note (Signed)
Addended by: Therisa Doyne on: 07/02/2019 04:35 PM   Modules accepted: Orders

## 2019-07-02 NOTE — ED Provider Notes (Signed)
[facility] EMERGENCY DEPARTMENT Provider Note   CSN: [id number] Arrival date & time: 07/02/19  2040    History   Chief Complaint Chief Complaint  Patient presents with  . Urinary Tract Infection    HPI Margaret Dyer is a 84 y.o. female with a past medical history of CHF, CKD, diabetes, hypertension, hyperlipidemia, h/o nephrectomy presents to ED for dysuria, incontinence and confusion for the past 6 hours.  Patient is concerned that she has a UTI as she notes history of similar symptoms in the past.  Reports "feeling sick to my stomach."  She is unsure if this is caused by the amount of pimento cheese that she ate earlier today.  Denies any vomiting, changes to bowel movements, abdominal pain, chest pain or shortness of breath.     HPI  Past Medical History:  Diagnosis Date  . Asthma   . Blood transfusion   . Chronic diastolic CHF (congestive heart failure) ([place])   . CKD (chronic kidney disease), stage III ([place])   . Coronary artery calcification seen on CT scan   . Diabetes mellitus   . Goiter   . Hyperlipidemia   . Hypertension   . Hypothyroidism   . Liver cirrhosis ([place])   . NASH (nonalcoholic steatohepatitis)   . Obesity   . Osteopenia   . Pulmonary hypertension ([place])   . Renal cell carcinoma    a. prior nephrectomy.  . Right heart failure ([place])   . Thrombocytopenia [facility])     Patient Active Problem List   Diagnosis Date Noted  . Exposure to Covid-19 Virus 06/16/2019  . Closed left hip fracture ([place]) 12/24/2018  . Dysuria 12/17/2017  . Dizziness 12/17/2017  . Community acquired pneumonia 12/17/2017  . Pneumonia 12/01/2017  . Urinary incontinence 11/09/2017  . Tic 11/09/2017  . CKD (chronic kidney disease), stage III ([place]) 09/12/2017  . Pulmonary hypertension ([place]) 09/12/2017  . Chronic diastolic CHF (congestive heart failure) ([place]) 06/05/2017  . Oral candida 05/29/2017  . Vitamin D deficiency 05/29/2017  . Acute on chronic respiratory  failure with hypoxia ([place]) 05/24/2017  . Anxiety 05/24/2017  . HCAP (healthcare-associated pneumonia) 05/22/2017  . Right heart failure ([place]) 05/17/2017  . Acute diastolic CHF (congestive heart failure) ([place])   . Exertional dyspnea 05/13/2017  . Hypertension 05/13/2017  . Asthma 05/13/2017  . DOE (dyspnea on exertion) 05/13/2017  . Diabetes mellitus type 2, insulin dependent ([place]) 05/13/2017  . Acute kidney injury ([place]) 05/13/2017  . History of nephrectomy 05/13/2017  . Hyperlipidemia LDL goal <70 05/13/2017  . Liver cirrhosis secondary to NASH ([place]) 05/13/2017  . Thrombocytopenia ([place]) 05/13/2017  . Renal cell carcinoma 05/13/2017  . Hypothyroidism 05/13/2017  . Hypersplenism 05/13/2017  . Osteoarthritis 05/13/2017  . Overactive bladder 05/13/2017  . URI, acute 02/22/2017  . Abrasion, left knee, initial encounter 02/22/2017  . CTS (carpal tunnel syndrome) 03/04/2016  . Bilateral contusion of ribs 10/05/2015  . Dental abscess 04/15/2014  . Claudication, intermittent ([place]) 12/05/2013  . Obesity (BMI 30-39.9) 07/22/2013  . Back pain 05/26/2013  . Platelets decreased ([place]) 05/26/2013  . Breast pain, right 04/21/2013  . Anxiety and depression 12/05/2012  . Bronchitis 11/06/2012  . HEMATURIA UNSPECIFIED 02/17/2011  . Hyperlipidemia 09/08/2010  . B12 DEFICIENCY 07/22/2010  . DIZZINESS 07/04/2010  . Essential hypertension 06/21/2010  . GOUT, UNSPECIFIED 01/27/2010  . NEPHRECTOMY, HX OF 12/02/2009  . NECK PAIN, LEFT 09/04/2008  . PARESTHESIA 09/02/2007  . GOITER NOS 04/23/2007  . Diabetes mellitus ([place])  04/23/2007  . Asthma 04/23/2007  . CALCULUS, KIDNEY 04/23/2007  . OSTEOPENIA 04/23/2007  . CARCINOMA, RENAL CELL 08/10/1999    Past Surgical History:  Procedure Laterality Date  . ABDOMINAL HYSTERECTOMY    . APPENDECTOMY    . HEMORRHOID SURGERY    . INTRAMEDULLARY (IM) NAIL INTERTROCHANTERIC Left 12/25/2018   Procedure: INTRAMEDULLARY (IM) NAIL INTERTROCHANTRIC;  Surgeon:  Nicholes Stairs, MD;  Location: [facility];  Service: Orthopedics;  Laterality: Left;  . KNEE SURGERY    . NEPHRECTOMY  09.2000     OB History   No obstetric history on file.      Home Medications    Prior to Admission medications   Medication Sig Start Date End Date Taking? Authorizing Provider  aspirin EC 81 MG tablet Take 81 mg by mouth daily.    [provider]  docusate sodium (COLACE) 100 MG capsule Take 1 capsule (100 mg total) by mouth 2 (two) times daily. 12/30/18   Bonnell Public, MD  ezetimibe-simvastatin (VYTORIN) 10-40 MG tablet Take 1 tablet by mouth daily.    [provider]  furosemide (LASIX) 40 MG tablet TAKE 1 TABLET BY MOUTH EVERY DAY AS NEEDED FOR WEIGHT GAIN OR SWELLING 06/30/19   Hilty, Nadean Corwin, MD  gabapentin (NEURONTIN) 100 MG capsule Take 1 capsule (100 mg total) by mouth 3 (three) times daily. Patient not taking: Reported on 07/02/2019 12/30/18   Dana Allan I, MD  hydrALAZINE (APRESOLINE) 25 MG tablet Take 1 tablet (25 mg total) by mouth every 8 (eight) hours. Take 1 tablet in the morning and half tablet in the evenings 05/15/19   Hilty, Nadean Corwin, MD  insulin glargine (LANTUS) 100 UNIT/ML injection Inject 0.1 mLs (10 Units total) into the skin at bedtime. Patient taking differently: Inject 25 Units into the skin at bedtime.  12/30/18   Bonnell Public, MD  Insulin Glulisine (APIDRA SOLOSTAR) 100 UNIT/ML Solostar Pen Inject 10 Units into the skin 3 (three) times daily before meals.    [provider]  levothyroxine (SYNTHROID, LEVOTHROID) 25 MCG tablet take 1 tablet by mouth daily Patient taking differently: Take 25 mcg by mouth See admin instructions. Take 25 mcg by mouth before breakfast on Mon/Tues/Wed/Thurs/Fri/Sat 07/13/17   Carollee Herter, Alferd Apa, DO  lisinopril (ZESTRIL) 5 MG tablet Take 1 tablet (5 mg total) by mouth daily. 07/02/19 09/30/19  Ledora Bottcher, PA  metoprolol tartrate (LOPRESSOR) 25 MG tablet TAKE 1  TABLET BY MOUTH TWICE DAILY 03/04/19   Carollee Herter, Alferd Apa, DO  Multiple Vitamins-Minerals (CENTRUM SILVER ULTRA WOMENS PO) Take by mouth daily.    [provider]  oxybutynin (DITROPAN) 5 MG tablet TAKE 1 TABLET BY MOUTH EVERY MORNING 06/26/19   Carollee Herter, Kendrick Fries R, DO  polyethylene glycol powder (GLYCOLAX/MIRALAX) powder Take 17 g by mouth daily as needed for mild constipation.    [provider]  potassium chloride (KLOR-CON) 20 MEQ packet Take 20 mEq by mouth 2 (two) times daily.    [provider]  potassium chloride SA (K-DUR,KLOR-CON) 20 MEQ tablet Take 1 tablet (20 mEq total) by mouth daily as needed (on the days you take Lasix). 02/24/19 05/25/19  Pixie Casino, MD  spironolactone (ALDACTONE) 25 MG tablet Take 0.5 tablets (12.5 mg total) by mouth daily. 03/26/19 06/24/19  Hilty, Nadean Corwin, MD  traMADol (ULTRAM) 50 MG tablet Take 1 tablet (50 mg total) by mouth every 6 (six) hours as needed for moderate pain. 06/13/19   Wendling,  Crosby Oyster, DO    Family History Family History  Problem Relation Age of Onset  . Cancer Sister        bladder  . Kidney cancer Father   . Cancer Father        renal  . COPD Father   . Congestive Heart Failure Father   . Coronary artery disease Other   . Diabetes Other   . Hyperlipidemia Other   . Hypertension Other   . Rheumatologic disease Neg Hx     Social History Social History   Tobacco Use  . Smoking status: Former Research scientist (life sciences)  . Smokeless tobacco: Never Used  . Tobacco comment: 05/30/18 none in 40 years  Substance Use Topics  . Alcohol use: No    Alcohol/week: 0.0 standard drinks  . Drug use: No     Allergies   Other, Cefuroxime axetil, and Ciprofloxacin   Review of Systems Review of Systems  Constitutional: Negative for appetite change, chills and fever.  HENT: Negative for ear pain, rhinorrhea, sneezing and sore throat.   Eyes: Negative for photophobia and visual disturbance.  Respiratory: Negative  for cough, chest tightness, shortness of breath and wheezing.   Cardiovascular: Negative for chest pain and palpitations.  Gastrointestinal: Positive for nausea. Negative for abdominal pain, blood in stool, constipation, diarrhea and vomiting.  Genitourinary: Positive for dysuria. Negative for hematuria and urgency.  Musculoskeletal: Negative for myalgias.  Skin: Negative for rash.  Neurological: Negative for dizziness, weakness and light-headedness.     Physical Exam Updated Vital Signs BP (!) 145/65   Pulse 91   Temp (!) 101 F (38.3 C) (Rectal)   Resp (!) 21   Ht _0  (1.626 m)   Wt 83.5 kg   LMP  (LMP Unknown)   SpO2 98%   BMI 31.58 kg/m   Physical Exam Vitals signs and nursing note reviewed.  Constitutional:      General: She is not in acute distress.    Appearance: She is well-developed.  HENT:     Head: Normocephalic and atraumatic.     Nose: Nose normal.  Eyes:     General: No scleral icterus.       Right eye: No discharge.        Left eye: No discharge.     Conjunctiva/sclera: Conjunctivae normal.  Neck:     Musculoskeletal: Normal range of motion and neck supple.  Cardiovascular:     Rate and Rhythm: Normal rate and regular rhythm.     Heart sounds: Normal heart sounds. No murmur. No friction rub. No gallop.   Pulmonary:     Effort: Pulmonary effort is normal. No respiratory distress.     Breath sounds: Normal breath sounds.  Abdominal:     General: Bowel sounds are normal. There is no distension.     Palpations: Abdomen is soft.     Tenderness: There is no abdominal tenderness. There is no guarding.  Musculoskeletal: Normal range of motion.  Skin:    General: Skin is warm and dry.     Findings: No rash.  Neurological:     General: No focal deficit present.     Mental Status: She is alert. Mental status is at baseline.     Cranial Nerves: No cranial nerve deficit.     Sensory: No sensory deficit.     Motor: No weakness or abnormal muscle tone.      Coordination: Coordination normal.      ED Treatments / Results  Labs (  all labs ordered are listed, but only abnormal results are displayed) Labs Reviewed  COMPREHENSIVE METABOLIC PANEL - Abnormal; Notable for the following components:      Result Value   Potassium 5.4 (*)    CO2 20 (*)    Glucose, Bld 197 (*)    BUN 32 (*)    Creatinine, Ser 1.14 (*)    Total Protein 5.9 (*)    AST 64 (*)    Total Bilirubin 1.6 (*)    GFR calc non Af Amer 45 (*)    GFR calc Af Amer 52 (*)    All other components within normal limits  CBC WITH DIFFERENTIAL/PLATELET - Abnormal; Notable for the following components:   Platelets 89 (*)    Lymphs Abs 0.2 (*)    Abs Immature Granulocytes 0.08 (*)    All other components within normal limits  URINALYSIS, ROUTINE W REFLEX MICROSCOPIC - Abnormal; Notable for the following components:   APPearance HAZY (*)    Glucose, UA 150 (*)    Hgb urine dipstick MODERATE (*)    Protein, ur 100 (*)    Leukocytes,Ua SMALL (*)    RBC / HPF >50 (*)    Bacteria, UA MANY (*)    All other components within normal limits  URINE CULTURE  CULTURE, BLOOD (ROUTINE X 2)  CULTURE, BLOOD (ROUTINE X 2)    EKG None  Radiology Dg Chest Portable 1 View  Result Date: 07/02/2019 CLINICAL DATA:  Incontinence and confusion EXAM: PORTABLE CHEST 1 VIEW COMPARISON:  December 28, 2018 FINDINGS: There is cardiomegaly. Mildly increased chronic interstitial lung changes seen at both bases. No pleural effusion. Aortic knob calcification. No acute osseous findings. IMPRESSION: No acute cardiopulmonary process. Electronically Signed   By: Prudencio Pair M.D.   On: 07/02/2019 22:23   Ct Renal Stone Study  Result Date: 07/02/2019 CLINICAL DATA:  Right flank pain EXAM: CT ABDOMEN AND PELVIS WITHOUT CONTRAST TECHNIQUE: Multidetector CT imaging of the abdomen and pelvis was performed following the standard protocol without IV contrast. COMPARISON:  CT 03/25/2019, 07/23/2012 FINDINGS: Lower  chest: Lung bases demonstrate no acute consolidation or effusion. Trace pericardial effusion. Coronary vascular calcification. Hepatobiliary: Nodular liver contour consistent with cirrhosis. Dilated appearing gallbladder without calcified stone. No biliary dilatation Pancreas: Unremarkable. No pancreatic ductal dilatation or surrounding inflammatory changes. Spleen: Enlarged with craniocaudad measurement of 14.2 cm. Adrenals/Urinary Tract: Right adrenal gland 12.9 mm nodule. 2.6 cm oblong left adrenal gland nodule, no change. Cortical scarring mid to upper right kidney. Status post left nephrectomy. No significant hydronephrosis. Possible punctate 1 mm stone in the distal right ureter about a cm proximal to the right UVJ, series 3, image number 78. Stomach/Bowel: Stomach is nonenlarged. No dilated small bowel. No colon wall thickening. Status post appendectomy. Vascular/Lymphatic: Extensive aortic atherosclerosis. No aneurysmal dilatation. No significantly enlarged lymph nodes. Reproductive: Status post hysterectomy. No adnexal masses. Other: Negative for free air or free fluid. Small fat in the umbilical region Musculoskeletal: Trace anterolisthesis L4 on L5. Multiple level degenerative change. No acute or suspicious osseous abnormality IMPRESSION: 1. Possible punctate 1 mm stone distal right ureter about a cm proximal to the right UVJ but no significant right hydronephrosis 2. Status post left nephrectomy 3. Cirrhosis of the liver.  Borderline to mild splenomegaly 4. Stable bilateral adrenal gland nodules Electronically Signed   By: Donavan Foil M.D.   On: 07/02/2019 23:27    Procedures Procedures (including critical care time)  Medications Ordered in ED Medications  acetaminophen (TYLENOL) tablet 650 mg (650 mg Oral Given 07/02/19 2211)  sodium chloride 0.9 % bolus 1,000 mL (1,000 mLs Intravenous New Bag/Given 07/02/19 2212)  cefTRIAXone (ROCEPHIN) 1 g in sodium chloride 0.9 % 100 mL IVPB (1 g  Intravenous New Bag/Given 07/02/19 2213)     Initial Impression / Assessment and Plan / ED Course  I have reviewed the triage vital signs and the nursing notes.  Pertinent labs & imaging results that were available during my care of the patient were reviewed by me and considered in my medical decision making (see chart for details).        84 year old female presents to ED for urinary incontinence, dysuria and confusion.  She is febrile to 101 on arrival.  She is concerned that she has a UTI.  She is a history of nephrectomy.  She has no abdominal tenderness palpation.  No CVA tenderness.  Today's labs show creatinine 1.14, urinalysis showing many bacteria, hematuria, small leukocytes.  Chest x-ray is unremarkable.  Patient given Rocephin, Tylenol and IV fluids.  Will obtain CT renal stone study.  11:36 PM CT renal stone study shows possible punctuate 1 mm stone distal right ureter without significant hydronephrosis.  Will consult urology for this potential infected stone.  11:48 PM Spoke to Dr. Alinda Money of urology.  He reviewed the CT images but is unsure if there is actually a stone there.  However, he does recommend hospitalist admission for observation.  If she potentially develops renal failure or signs concerning for obstruction, they will need to be consulted.  Will call hospitalist for admission.  Final Clinical Impressions(s) / ED Diagnoses   Final diagnoses:  Lower urinary tract infectious disease    ED Discharge Orders    None       Delia Heady, PA-C 07/02/19 2349    Drenda Freeze, MD 07/07/19 [phone]

## 2019-07-02 NOTE — Discharge Instructions (Signed)
Keflex zofran

## 2019-07-02 NOTE — ED Triage Notes (Signed)
Pt brought to ED by [facility] for increase incontinence and confusion since today at 3 pm pt states this happens when she has a UTI. BP for EMS 193/1096, HR 97, R 18, 98% on 2 L [place], pt is home O2 [place] dependent. 350 mL NS given by EMS pta.

## 2019-07-03 ENCOUNTER — Encounter (HOSPITAL_COMMUNITY): Payer: Self-pay | Admitting: Internal Medicine

## 2019-07-03 DIAGNOSIS — Z794 Long term (current) use of insulin: Secondary | ICD-10-CM

## 2019-07-03 DIAGNOSIS — Z8051 Family history of malignant neoplasm of kidney: Secondary | ICD-10-CM | POA: Diagnosis not present

## 2019-07-03 DIAGNOSIS — Z7982 Long term (current) use of aspirin: Secondary | ICD-10-CM | POA: Diagnosis not present

## 2019-07-03 DIAGNOSIS — K7460 Unspecified cirrhosis of liver: Secondary | ICD-10-CM | POA: Diagnosis present

## 2019-07-03 DIAGNOSIS — A4151 Sepsis due to Escherichia coli [E. coli]: Secondary | ICD-10-CM | POA: Diagnosis not present

## 2019-07-03 DIAGNOSIS — I130 Hypertensive heart and chronic kidney disease with heart failure and stage 1 through stage 4 chronic kidney disease, or unspecified chronic kidney disease: Secondary | ICD-10-CM | POA: Diagnosis not present

## 2019-07-03 DIAGNOSIS — I5082 Biventricular heart failure: Secondary | ICD-10-CM | POA: Diagnosis not present

## 2019-07-03 DIAGNOSIS — M1990 Unspecified osteoarthritis, unspecified site: Secondary | ICD-10-CM | POA: Diagnosis present

## 2019-07-03 DIAGNOSIS — Z825 Family history of asthma and other chronic lower respiratory diseases: Secondary | ICD-10-CM | POA: Diagnosis not present

## 2019-07-03 DIAGNOSIS — E039 Hypothyroidism, unspecified: Secondary | ICD-10-CM | POA: Diagnosis not present

## 2019-07-03 DIAGNOSIS — I5032 Chronic diastolic (congestive) heart failure: Secondary | ICD-10-CM | POA: Diagnosis not present

## 2019-07-03 DIAGNOSIS — K7581 Nonalcoholic steatohepatitis (NASH): Secondary | ICD-10-CM | POA: Diagnosis not present

## 2019-07-03 DIAGNOSIS — N183 Chronic kidney disease, stage 3 (moderate): Secondary | ICD-10-CM

## 2019-07-03 DIAGNOSIS — E785 Hyperlipidemia, unspecified: Secondary | ICD-10-CM | POA: Diagnosis not present

## 2019-07-03 DIAGNOSIS — D696 Thrombocytopenia, unspecified: Secondary | ICD-10-CM | POA: Diagnosis present

## 2019-07-03 DIAGNOSIS — N390 Urinary tract infection, site not specified: Secondary | ICD-10-CM | POA: Diagnosis present

## 2019-07-03 DIAGNOSIS — A059 Bacterial foodborne intoxication, unspecified: Secondary | ICD-10-CM | POA: Insufficient documentation

## 2019-07-03 DIAGNOSIS — E119 Type 2 diabetes mellitus without complications: Secondary | ICD-10-CM

## 2019-07-03 DIAGNOSIS — J45909 Unspecified asthma, uncomplicated: Secondary | ICD-10-CM | POA: Diagnosis present

## 2019-07-03 DIAGNOSIS — R7881 Bacteremia: Secondary | ICD-10-CM | POA: Diagnosis not present

## 2019-07-03 DIAGNOSIS — Z8249 Family history of ischemic heart disease and other diseases of the circulatory system: Secondary | ICD-10-CM | POA: Diagnosis not present

## 2019-07-03 DIAGNOSIS — Z20828 Contact with and (suspected) exposure to other viral communicable diseases: Secondary | ICD-10-CM | POA: Diagnosis not present

## 2019-07-03 DIAGNOSIS — Z79890 Hormone replacement therapy (postmenopausal): Secondary | ICD-10-CM | POA: Diagnosis not present

## 2019-07-03 DIAGNOSIS — R319 Hematuria, unspecified: Secondary | ICD-10-CM | POA: Diagnosis not present

## 2019-07-03 DIAGNOSIS — J9610 Chronic respiratory failure, unspecified whether with hypoxia or hypercapnia: Secondary | ICD-10-CM | POA: Diagnosis not present

## 2019-07-03 DIAGNOSIS — Z85528 Personal history of other malignant neoplasm of kidney: Secondary | ICD-10-CM | POA: Diagnosis not present

## 2019-07-03 DIAGNOSIS — Z905 Acquired absence of kidney: Secondary | ICD-10-CM | POA: Diagnosis not present

## 2019-07-03 DIAGNOSIS — M8580 Other specified disorders of bone density and structure, unspecified site: Secondary | ICD-10-CM | POA: Diagnosis present

## 2019-07-03 LAB — BLOOD CULTURE ID PANEL (REFLEXED)

## 2019-07-03 LAB — BASIC METABOLIC PANEL
Anion gap: 8 (ref 5–15)
BUN: 33 mg/dL — ABNORMAL HIGH (ref 8–23)
CO2: 23 mmol/L (ref 22–32)
Calcium: 8.9 mg/dL (ref 8.9–10.3)
Chloride: 106 mmol/L (ref 98–111)
Creatinine, Ser: 1.29 mg/dL — ABNORMAL HIGH (ref 0.44–1.00)
GFR calc Af Amer: 45 mL/min — ABNORMAL LOW (ref >60)
GFR calc non Af Amer: 39 mL/min — ABNORMAL LOW (ref >60)
Glucose, Bld: 271 mg/dL — ABNORMAL HIGH (ref 70–99)
Potassium: 3.7 mmol/L (ref 3.5–5.1)
Sodium: 137 mmol/L (ref 135–145)

## 2019-07-03 LAB — CBG MONITORING, ED
Glucose-Capillary: 267 mg/dL — ABNORMAL HIGH (ref 70–99)
Glucose-Capillary: 207 mg/dL — ABNORMAL HIGH (ref 70–99)
Glucose-Capillary: 152 mg/dL — ABNORMAL HIGH (ref 70–99)

## 2019-07-03 LAB — GLUCOSE, CAPILLARY
Glucose-Capillary: 146 mg/dL — ABNORMAL HIGH (ref 70–99)
Glucose-Capillary: 193 mg/dL — ABNORMAL HIGH (ref 70–99)

## 2019-07-03 LAB — CBC
HCT: 38.7 % (ref 36.0–46.0)
Hemoglobin: 12.8 g/dL (ref 12.0–15.0)
MCH: 30.3 pg (ref 26.0–34.0)
MCHC: 33.1 g/dL (ref 30.0–36.0)
MCV: 91.5 fL (ref 80.0–100.0)
Platelets: 87 10*3/uL — ABNORMAL LOW (ref 150–400)
RBC: 4.23 MIL/uL (ref 3.87–5.11)
RDW: 13.6 % (ref 11.5–15.5)
WBC: 15.5 10*3/uL — ABNORMAL HIGH (ref 4.0–10.5)
nRBC: 0 % (ref 0.0–0.2)

## 2019-07-03 LAB — SARS CORONAVIRUS 2 BY RT PCR (HOSPITAL ORDER, PERFORMED IN ~~LOC~~ HOSPITAL LAB): SARS Coronavirus 2: NEGATIVE

## 2019-07-03 MED ORDER — ACETAMINOPHEN 325 MG PO TABS
650.0000 mg | ORAL_TABLET | Freq: Four times a day (QID) | ORAL | Status: DC | PRN
  Administered 2019-07-06 (×2): 650 mg via ORAL
  Filled 2019-07-03 (×2): qty 2

## 2019-07-03 MED ORDER — SODIUM CHLORIDE 0.9 % IV SOLN
INTRAVENOUS | Status: DC
  Administered 2019-07-03: 08:00:00 via INTRAVENOUS

## 2019-07-03 MED ORDER — DOCUSATE SODIUM 100 MG PO CAPS
100.0000 mg | ORAL_CAPSULE | Freq: Every day | ORAL | Status: DC
  Administered 2019-07-03 – 2019-07-06 (×4): 100 mg via ORAL
  Filled 2019-07-03 (×4): qty 1

## 2019-07-03 MED ORDER — POLYETHYLENE GLYCOL 3350 17 G PO PACK
17.0000 g | PACK | Freq: Every day | ORAL | Status: DC | PRN
  Administered 2019-07-05: 17 g via ORAL
  Filled 2019-07-03 (×2): qty 1

## 2019-07-03 MED ORDER — ACETAMINOPHEN 650 MG RE SUPP: 650.0000 mg | Freq: Four times a day (QID) | RECTAL | Status: DC | PRN

## 2019-07-03 MED ORDER — SODIUM CHLORIDE 0.9 % IV SOLN
INTRAVENOUS | Status: AC
  Administered 2019-07-03: 15:00:00 via INTRAVENOUS

## 2019-07-03 MED ORDER — OXYBUTYNIN CHLORIDE 5 MG PO TABS
5.0000 mg | ORAL_TABLET | Freq: Every day | ORAL | Status: DC
  Administered 2019-07-03 – 2019-07-06 (×4): 5 mg via ORAL
  Filled 2019-07-03 (×4): qty 1

## 2019-07-03 MED ORDER — METOPROLOL TARTRATE 25 MG PO TABS
25.0000 mg | ORAL_TABLET | Freq: Two times a day (BID) | ORAL | Status: DC
  Administered 2019-07-03 – 2019-07-06 (×8): 25 mg via ORAL
  Filled 2019-07-03 (×8): qty 1

## 2019-07-03 MED ORDER — INSULIN ASPART 100 UNIT/ML ~~LOC~~ SOLN
0.0000 [IU] | Freq: Three times a day (TID) | SUBCUTANEOUS | Status: DC
  Administered 2019-07-03: 14:00:00 2 [IU] via SUBCUTANEOUS
  Administered 2019-07-03 – 2019-07-04 (×2): 3 [IU] via SUBCUTANEOUS
  Administered 2019-07-04 – 2019-07-05 (×3): 2 [IU] via SUBCUTANEOUS
  Administered 2019-07-05: 1 [IU] via SUBCUTANEOUS
  Administered 2019-07-06: 3 [IU] via SUBCUTANEOUS

## 2019-07-03 MED ORDER — SODIUM CHLORIDE 0.9 % IV SOLN
2.0000 g | INTRAVENOUS | Status: DC
  Administered 2019-07-03 – 2019-07-04 (×2): 2 g via INTRAVENOUS
  Filled 2019-07-03 (×2): qty 20

## 2019-07-03 MED ORDER — ASPIRIN EC 81 MG PO TBEC
81.0000 mg | DELAYED_RELEASE_TABLET | Freq: Every day | ORAL | Status: DC
  Administered 2019-07-03 – 2019-07-06 (×4): 81 mg via ORAL
  Filled 2019-07-03 (×4): qty 1

## 2019-07-03 MED ORDER — TRAMADOL HCL 50 MG PO TABS
50.0000 mg | ORAL_TABLET | Freq: Two times a day (BID) | ORAL | Status: DC | PRN
  Administered 2019-07-03 – 2019-07-06 (×4): 50 mg via ORAL
  Filled 2019-07-03 (×4): qty 1

## 2019-07-03 MED ORDER — HYDRALAZINE HCL 25 MG PO TABS
25.0000 mg | ORAL_TABLET | Freq: Two times a day (BID) | ORAL | Status: DC
  Administered 2019-07-03 – 2019-07-06 (×8): 25 mg via ORAL
  Filled 2019-07-03 (×8): qty 1

## 2019-07-03 MED ORDER — PROMETHAZINE HCL 25 MG/ML IJ SOLN: 12.5000 mg | Freq: Four times a day (QID) | INTRAMUSCULAR | Status: DC | PRN

## 2019-07-03 MED ORDER — SODIUM CHLORIDE 0.9 % IV SOLN: 1.0000 g | INTRAVENOUS | Status: DC

## 2019-07-03 MED ORDER — ENOXAPARIN SODIUM 40 MG/0.4ML ~~LOC~~ SOLN
40.0000 mg | Freq: Every day | SUBCUTANEOUS | Status: DC
  Administered 2019-07-03 – 2019-07-06 (×4): 40 mg via SUBCUTANEOUS
  Filled 2019-07-03 (×4): qty 0.4

## 2019-07-03 MED ORDER — ENSURE ENLIVE PO LIQD
237.0000 mL | Freq: Two times a day (BID) | ORAL | Status: DC
  Administered 2019-07-03 – 2019-07-04 (×2): 237 mL via ORAL

## 2019-07-03 MED ORDER — INSULIN GLARGINE 100 UNIT/ML ~~LOC~~ SOLN
25.0000 [IU] | Freq: Every day | SUBCUTANEOUS | Status: DC
  Administered 2019-07-03 – 2019-07-05 (×4): 25 [IU] via SUBCUTANEOUS
  Filled 2019-07-03 (×5): qty 0.25

## 2019-07-03 MED ORDER — EZETIMIBE-SIMVASTATIN 10-40 MG PO TABS
1.0000 | ORAL_TABLET | Freq: Every day | ORAL | Status: DC
  Administered 2019-07-03 – 2019-07-06 (×4): 1 via ORAL
  Filled 2019-07-03 (×4): qty 1

## 2019-07-03 MED ORDER — LEVOTHYROXINE SODIUM 25 MCG PO TABS
25.0000 ug | ORAL_TABLET | ORAL | Status: DC
  Administered 2019-07-03 – 2019-07-05 (×3): 25 ug via ORAL
  Filled 2019-07-03 (×3): qty 1

## 2019-07-03 NOTE — ED Notes (Signed)
Lunch tray ordered 

## 2019-07-03 NOTE — ED Notes (Signed)
ED TO INPATIENT HANDOFF REPORT  ED Nurse Name and Phone #: Mauri Brooklyn Name/Age/Gender Margaret Dyer 84 y.o. female Room/Bed: 034C/034C  Code Status   Code Status: Full Code  Home/SNF/Other DC home {AO x 4   Triage Complete: Triage complete  Chief Complaint Possible UTI  Triage Note Pt brought to ED by [facility] for increase incontinence and confusion since today at 3 pm pt states this happens when she has a UTI. BP for EMS 193/1096, HR 97, R 18, 98% on 2 L [place], pt is home O2 [place] dependent. 350 mL NS given by EMS pta.   Allergies Allergies  Allergen Reactions  . Other Other (See Comments)    Patient has hemorrhaged at least 4 times in her lifetime  . Cefuroxime Axetil Nausea Only    Upset stomach   . Ciprofloxacin Nausea Only    Upset stomach    Level of Care/Admitting Diagnosis ED Disposition    ED Disposition Condition [facility]: [place] [[id number]]  Level of Care: Telemetry Medical [104]  I expect the patient will be discharged within 24 hours: Yes  LOW acuity---Tx typically complete <24 hrs---ACUTE conditions typically can be evaluated <24 hours---LABS likely to return to acceptable levels <24 hours---IS near functional baseline---EXPECTED to return to current living arrangement---NOT newly hypoxic: Meets criteria for 5C-Observation unit  Covid Evaluation: Asymptomatic Screening Protocol (No Symptoms)  Diagnosis: UTI (urinary tract infection) [706237]  Admitting Physician: Shela Leff [[id number]]  Attending Physician: Shela Leff [[id number]]  PT Class (Do Not Modify): Observation [104]  PT Acc Code (Do Not Modify): Observation [10022]       B Medical/Surgery History Past Medical History:  Diagnosis Date  . Asthma   . Blood transfusion   . Chronic diastolic CHF (congestive heart failure) ([place])   . CKD (chronic kidney disease), stage III ([place])   . Coronary artery calcification seen on CT scan   . Diabetes  mellitus   . Goiter   . Hyperlipidemia   . Hypertension   . Hypothyroidism   . Liver cirrhosis ([place])   . NASH (nonalcoholic steatohepatitis)   . Obesity   . Osteopenia   . Pulmonary hypertension ([place])   . Renal cell carcinoma    a. prior nephrectomy.  . Right heart failure ([place])   . Thrombocytopenia ([place])    Past Surgical History:  Procedure Laterality Date  . ABDOMINAL HYSTERECTOMY    . APPENDECTOMY    . HEMORRHOID SURGERY    . INTRAMEDULLARY (IM) NAIL INTERTROCHANTERIC Left 12/25/2018   Procedure: INTRAMEDULLARY (IM) NAIL INTERTROCHANTRIC;  Surgeon: Nicholes Stairs, MD;  Location: [facility];  Service: Orthopedics;  Laterality: Left;  . KNEE SURGERY    . NEPHRECTOMY  09.2000     A IV Location/Drains/Wounds Patient Lines/Drains/Airways Status   Active Line/Drains/Airways    Name:   Placement date:   Placement time:   Site:   Days:   Peripheral IV 07/02/19 Left Antecubital   07/02/19    2049    Antecubital   1   External Urinary Catheter   12/25/18    2154    -   190   Incision (Closed) 05/18/17 Arm Right   05/18/17    1706     776   Incision (Closed) 12/25/18 Hip Left   12/25/18    1251     190   Wound / Incision (Open or Dehisced) 03/02/14 Other (Comment) Arm Left;Posterior   03/02/14  1629    Arm   1949          Intake/Output Last 24 hours  Intake/Output Summary (Last 24 hours) at 07/03/2019 0023 Last data filed at 07/03/2019 0016 Gross per 24 hour  Intake 50 ml  Output -  Net 50 ml    Labs/Imaging Results for orders placed or performed during the hospital encounter of 07/02/19 (from the past 48 hour(s))  Comprehensive metabolic panel     Status: Abnormal   Collection Time: 07/02/19  9:07 PM  Result Value Ref Range   Sodium 137 135 - 145 mmol/L   Potassium 5.4 (H) 3.5 - 5.1 mmol/L    Comment: SPECIMEN HEMOLYZED. HEMOLYSIS MAY AFFECT INTEGRITY OF RESULTS.   Chloride 104 98 - 111 mmol/L   CO2 20 (L) 22 - 32 mmol/L   Glucose, Bld 197 (H) 70 - 99 mg/dL    BUN 32 (H) 8 - 23 mg/dL   Creatinine, Ser 1.14 (H) 0.44 - 1.00 mg/dL   Calcium 9.1 8.9 - 10.3 mg/dL   Total Protein 5.9 (L) 6.5 - 8.1 g/dL   Albumin 3.5 3.5 - 5.0 g/dL   AST 64 (H) 15 - 41 U/L   ALT 32 0 - 44 U/L   Alkaline Phosphatase 62 38 - 126 U/L   Total Bilirubin 1.6 (H) 0.3 - 1.2 mg/dL   GFR calc non Af Amer 45 (L) >60 mL/min   GFR calc Af Amer 52 (L) >60 mL/min   Anion gap 13 5 - 15    Comment: Performed at [facility], [address], [place], [place] [address]  CBC with Differential     Status: Abnormal   Collection Time: 07/02/19  9:07 PM  Result Value Ref Range   WBC 7.0 4.0 - 10.5 K/uL   RBC 4.71 3.87 - 5.11 MIL/uL   Hemoglobin 14.2 12.0 - 15.0 g/dL   HCT 42.9 36.0 - 46.0 %   MCV 91.1 80.0 - 100.0 fL   MCH 30.1 26.0 - 34.0 pg   MCHC 33.1 30.0 - 36.0 g/dL   RDW 13.5 11.5 - 15.5 %   Platelets 89 (L) 150 - 400 K/uL    Comment: REPEATED TO VERIFY PLATELET COUNT CONFIRMED BY SMEAR Immature Platelet Fraction may be clinically indicated, consider ordering this additional test [id number]    nRBC 0.0 0.0 - 0.2 %   Neutrophils Relative % 94 %   Neutro Abs 6.5 1.7 - 7.7 K/uL   Lymphocytes Relative 2 %   Lymphs Abs 0.2 (L) 0.7 - 4.0 K/uL   Monocytes Relative 3 %   Monocytes Absolute 0.2 0.1 - 1.0 K/uL   Eosinophils Relative 0 %   Eosinophils Absolute 0.0 0.0 - 0.5 K/uL   Basophils Relative 0 %   Basophils Absolute 0.0 0.0 - 0.1 K/uL   Immature Granulocytes 1 %   Abs Immature Granulocytes 0.08 (H) 0.00 - 0.07 K/uL    Comment: Performed at [facility], [place] [address] [place], [place] [address]  Urinalysis, Routine w reflex microscopic     Status: Abnormal   Collection Time: 07/02/19  9:07 PM  Result Value Ref Range   Color, Urine YELLOW YELLOW   APPearance HAZY (A) CLEAR   Specific Gravity, Urine 1.016 1.005 - 1.030   pH 5.0 5.0 - 8.0   Glucose, UA 150 (A) NEGATIVE mg/dL   Hgb urine dipstick MODERATE (A) NEGATIVE   Bilirubin Urine NEGATIVE NEGATIVE    Ketones, ur  NEGATIVE NEGATIVE mg/dL   Protein, ur 100 (A) NEGATIVE mg/dL   Nitrite NEGATIVE NEGATIVE   Leukocytes,Ua SMALL (A) NEGATIVE   RBC / HPF >50 (H) 0 - 5 RBC/hpf   WBC, UA 21-50 0 - 5 WBC/hpf   Bacteria, UA MANY (A) NONE SEEN   Mucus PRESENT    Hyaline Casts, UA PRESENT     Comment: Performed at [facility] [address] [place],  [address]   Dg Chest Portable 1 View  Result Date: 07/02/2019 CLINICAL DATA:  Incontinence and confusion EXAM: PORTABLE CHEST 1 VIEW COMPARISON:  December 28, 2018 FINDINGS: There is cardiomegaly. Mildly increased chronic interstitial lung changes seen at both bases. No pleural effusion. Aortic knob calcification. No acute osseous findings. IMPRESSION: No acute cardiopulmonary process. Electronically Signed   By: Prudencio Pair M.D.   On: 07/02/2019 22:23   Ct Renal Stone Study  Result Date: 07/02/2019 CLINICAL DATA:  Right flank pain EXAM: CT ABDOMEN AND PELVIS WITHOUT CONTRAST TECHNIQUE: Multidetector CT imaging of the abdomen and pelvis was performed following the standard protocol without IV contrast. COMPARISON:  CT 03/25/2019, 07/23/2012 FINDINGS: Lower chest: Lung bases demonstrate no acute consolidation or effusion. Trace pericardial effusion. Coronary vascular calcification. Hepatobiliary: Nodular liver contour consistent with cirrhosis. Dilated appearing gallbladder without calcified stone. No biliary dilatation Pancreas: Unremarkable. No pancreatic ductal dilatation or surrounding inflammatory changes. Spleen: Enlarged with craniocaudad measurement of 14.2 cm. Adrenals/Urinary Tract: Right adrenal gland 12.9 mm nodule. 2.6 cm oblong left adrenal gland nodule, no change. Cortical scarring mid to upper right kidney. Status post left nephrectomy. No significant hydronephrosis. Possible punctate 1 mm stone in the distal right ureter about a cm proximal to the right UVJ, series 3, image number 78. Stomach/Bowel: Stomach is nonenlarged. No  dilated small bowel. No colon wall thickening. Status post appendectomy. Vascular/Lymphatic: Extensive aortic atherosclerosis. No aneurysmal dilatation. No significantly enlarged lymph nodes. Reproductive: Status post hysterectomy. No adnexal masses. Other: Negative for free air or free fluid. Small fat in the umbilical region Musculoskeletal: Trace anterolisthesis L4 on L5. Multiple level degenerative change. No acute or suspicious osseous abnormality IMPRESSION: 1. Possible punctate 1 mm stone distal right ureter about a cm proximal to the right UVJ but no significant right hydronephrosis 2. Status post left nephrectomy 3. Cirrhosis of the liver.  Borderline to mild splenomegaly 4. Stable bilateral adrenal gland nodules Electronically Signed   By: Donavan Foil M.D.   On: 07/02/2019 23:27    Pending Labs Unresulted Labs (From admission, onward)    Start     Ordered   07/03/19 0500  CBC  Tomorrow morning,   R     07/03/19 0015   07/03/19 2774  Basic metabolic panel  Tomorrow morning,   R     07/03/19 0015   07/02/19 2349  SARS Coronavirus 2 (CEPHEID - Performed in [facility] hospital lab), Hosp Order  (Asymptomatic Patients Labs)  Once,   STAT    Question:  Rule Out  Answer:  Yes   07/02/19 2348   07/02/19 2115  Blood culture (routine x 2)  BLOOD CULTURE X 2,   STAT     07/02/19 2114   07/02/19 2100  Urine culture  ONCE - STAT,   STAT     07/02/19 2059          Vitals/Pain Today's Vitals   07/02/19 2130 07/02/19 2145 07/02/19 2336 07/02/19 2355  BP: 138/64 (!) 151/65    Pulse: 86 87  Resp: 20 20    Temp:   99.3 F (37.4 C)   TempSrc:   Oral   SpO2: 98% 98%    Weight:      Height:      PainSc:    0-No pain    Isolation Precautions No active isolations  Medications Medications  cefTRIAXone (ROCEPHIN) 1 g in sodium chloride 0.9 % 100 mL IVPB (has no administration in time range)  aspirin EC tablet 81 mg (has no administration in time range)  ezetimibe-simvastatin  (VYTORIN) 10-40 MG per tablet 1 tablet (has no administration in time range)  hydrALAZINE (APRESOLINE) tablet 25 mg (has no administration in time range)  metoprolol tartrate (LOPRESSOR) tablet 25 mg (has no administration in time range)  insulin glargine (LANTUS) injection 25 Units (has no administration in time range)  levothyroxine (SYNTHROID) tablet 25 mcg (has no administration in time range)  docusate sodium (COLACE) capsule 100 mg (has no administration in time range)  polyethylene glycol (MIRALAX / GLYCOLAX) packet 17 g (has no administration in time range)  oxybutynin (DITROPAN) tablet 5 mg (has no administration in time range)  enoxaparin (LOVENOX) injection 40 mg (has no administration in time range)  acetaminophen (TYLENOL) tablet 650 mg (has no administration in time range)    Or  acetaminophen (TYLENOL) suppository 650 mg (has no administration in time range)  insulin aspart (novoLOG) injection 0-9 Units (has no administration in time range)  acetaminophen (TYLENOL) tablet 650 mg (650 mg Oral Given 07/02/19 2211)  sodium chloride 0.9 % bolus 1,000 mL (1,000 mLs Intravenous New Bag/Given 07/02/19 2212)  cefTRIAXone (ROCEPHIN) 1 g in sodium chloride 0.9 % 100 mL IVPB (0 g Intravenous Stopped 07/03/19 0016)    Mobility One assistance Moderate fall risk   Focused Assessments Pt AO x 4 with repetitive question. No skin issues, SR on the monitor, external urine catheter in place.   R Recommendations: See Admitting Provider Note  Report given to:   Additional Notes:

## 2019-07-03 NOTE — ED Notes (Signed)
Food tray given to patient

## 2019-07-03 NOTE — ED Notes (Signed)
Still waiting for food tray for patient.

## 2019-07-03 NOTE — Progress Notes (Signed)
PHARMACY - PHYSICIAN COMMUNICATION CRITICAL VALUE ALERT - BLOOD CULTURE IDENTIFICATION (BCID)  Margaret Dyer is an 84 y.o. female who presented to [facility] on 07/02/2019 with a chief complaint of five days of dysuria.  Assessment:  Patient presents with symptoms of UTI, likely urinary source for the bacteremia. Will continue current therapy with ceftriaxone and narrow based on sensitivities.   Name of physician (or Provider) Contacted: Dyanne Carrel  Current antibiotics: Ceftriaxone   Changes to prescribed antibiotics recommended:  Continue Ceftriaxone 2g q24 , recommendations accepted by provider  Results for orders placed or performed during the hospital encounter of 07/02/19  Blood Culture ID Panel (Reflexed) (Collected: 07/02/2019 10:00 PM)  Result Value Ref Range   Enterococcus species NOT DETECTED NOT DETECTED   Listeria monocytogenes NOT DETECTED NOT DETECTED   Staphylococcus species NOT DETECTED NOT DETECTED   Staphylococcus aureus (BCID) NOT DETECTED NOT DETECTED   Streptococcus species NOT DETECTED NOT DETECTED   Streptococcus agalactiae NOT DETECTED NOT DETECTED   Streptococcus pneumoniae NOT DETECTED NOT DETECTED   Streptococcus pyogenes NOT DETECTED NOT DETECTED   Acinetobacter baumannii NOT DETECTED NOT DETECTED   Enterobacteriaceae species DETECTED (A) NOT DETECTED   Enterobacter cloacae complex NOT DETECTED NOT DETECTED   Escherichia coli DETECTED (A) NOT DETECTED   Klebsiella oxytoca NOT DETECTED NOT DETECTED   Klebsiella pneumoniae NOT DETECTED NOT DETECTED   Proteus species NOT DETECTED NOT DETECTED   Serratia marcescens NOT DETECTED NOT DETECTED   Carbapenem resistance NOT DETECTED NOT DETECTED   Haemophilus influenzae NOT DETECTED NOT DETECTED   Neisseria meningitidis NOT DETECTED NOT DETECTED   Pseudomonas aeruginosa NOT DETECTED NOT DETECTED   Candida albicans NOT DETECTED NOT DETECTED   Candida glabrata NOT DETECTED NOT DETECTED   Candida krusei NOT  DETECTED NOT DETECTED   Candida parapsilosis NOT DETECTED NOT DETECTED   Candida tropicalis NOT DETECTED NOT DETECTED    Phillis Haggis 07/03/2019  12:47 PM

## 2019-07-03 NOTE — Progress Notes (Signed)
[facility] PROGRESS NOTE  Margaret Dyer CMK:[id number] DOB: [birth year] DOA: 07/02/2019 PCP: Ann Held, DO  Assessment/Plan: Acute complicated UTI Patient has 1 kidney, history of left nephrectomy. Max temp 101 F rectally 7/29.  WBC 15.5.  Hemodynamically stable.  UA with small amount of leukocytes, greater than 50 RBCs, 21-50 WBCs, and many bacteria. CT renal stone study showing possible punctate 1 mm stone in the distal right ureter about a centimeter proximal to the right UVJ but no significant right hydronephrosis. Per H &P,  ED provider discussed the case with Dr. Alinda Money from urology who reviewed the CT images and is unsure if there is actually a stone there.  Recommending observation to see if patient develops signs of renal failure or signs concerning for obstruction. Creatinine trending up slightly. Urine output good.  - continue Ceftriaxone -Continue to monitor renal function, white count -follow urine culture -Tylenol PRN  Bacteremia: blood cultures gram neg rods x2. Max temp 101, wbc 15. Hemodynamically stable.  -increase rocephin to 2g -continue IV fluids -intake and output -vs q 4 x3 -supportive therapy  Chronic kidney disease stage III. Creatinine trending up today to 1.29 which is above her baseline.  -IV fluids as noted above -hold nephrotoxins -monitor urine output -recheck in am  Chronic respiratory failure. Recently diagnosed per chart. On home oxygen. 2 L at rest 4L with activity and sleep. No increased oxygen demand as yet. Chest xray with no acute cardiopulmonary process. -continue oxygen supplementation -monitor oxygen saturation level  Insulin-dependent type 2 diabetes. HgA1c 6.2 4/20 -will recheck HgA1c -Sliding scale insulin and CBG checks. -Continue home Lantus 25 units at bedtime  Hyperlipidemia -Continue home Vytorin  Hypertension -Continue home hydralazine, metoprolol  Hypothyroidism -Continue home Synthroid  Code  Status: full Family Communication: patient Disposition Plan: home when ready   Consultants:    Procedures:    Antibiotics:  Rocephin 07/02/19 increased to 2g 7/30  HPI/Subjective: 84 yo admitted with UTI and ?stone. Work up reveals fever, leukocytosis, bacteremia. Provided rocephin (dose increased 7/30), iv fluids.   Objective: Vitals:   07/03/19 1215 07/03/19 1216  BP: (!) 134/45 (!) 134/45  Pulse:  66  Resp:    Temp:    SpO2:      Intake/Output Summary (Last 24 hours) at 07/03/2019 1258 Last data filed at 07/03/2019 0016 Gross per 24 hour  Intake 50 ml  Output -  Net 50 ml   Filed Weights   07/02/19 2046  Weight: 83.5 kg    Exam:   General:  Awake alert no acute distress but appears acutely ill  Cardiovascular: rrr no mgr No LE edema  Respiratory: normal effort BS clear bilaterally no wheeze  Abdomen: obese soft +BS no guarding or rebounding  Musculoskeletal: joints without swelling/erythema   Data Reviewed: Basic Metabolic Panel: Recent Labs  Lab 07/02/19 2107 07/03/19 0253  NA 137 137  K 5.4* 3.7  CL 104 106  CO2 20* 23  GLUCOSE 197* 271*  BUN 32* 33*  CREATININE 1.14* 1.29*  CALCIUM 9.1 8.9   Liver Function Tests: Recent Labs  Lab 07/02/19 2107  AST 64*  ALT 32  ALKPHOS 62  BILITOT 1.6*  PROT 5.9*  ALBUMIN 3.5   No results for input(s): LIPASE, AMYLASE in the last 168 hours. No results for input(s): AMMONIA in the last 168 hours. CBC: Recent Labs  Lab 07/02/19 2107 07/03/19 0253  WBC 7.0 15.5*  NEUTROABS 6.5  --   HGB 14.2 12.8  HCT 42.9 38.7  MCV 91.1 91.5  PLT 89* 87*   Cardiac Enzymes: No results for input(s): CKTOTAL, CKMB, CKMBINDEX, TROPONINI in the last 168 hours. BNP (last 3 results) No results for input(s): BNP in the last 8760 hours.  ProBNP (last 3 results) No results for input(s): PROBNP in the last 8760 hours.  CBG: Recent Labs  Lab 07/03/19 0206 07/03/19 0724  GLUCAP 267* 207*    Recent  Results (from the past 240 hour(s))  Blood culture (routine x 2)     Status: None (Preliminary result)   Collection Time: 07/02/19  9:57 PM   Specimen: BLOOD  Result Value Ref Range Status   Specimen Description BLOOD RIGHT HAND  Final   Special Requests   Final    BOTTLES DRAWN AEROBIC ONLY Blood Culture results may not be optimal due to an inadequate volume of blood received in culture bottles   Culture  Setup Time   Final    GRAM NEGATIVE RODS AEROBIC BOTTLE ONLY CRITICAL VALUE NOTED.  VALUE IS CONSISTENT WITH PREVIOUSLY REPORTED AND CALLED VALUE. Performed at [facility], [place] [address] [place], [place] [address]    Culture GRAM NEGATIVE RODS  Final   Report Status PENDING  Incomplete  Blood culture (routine x 2)     Status: None (Preliminary result)   Collection Time: 07/02/19 10:00 PM   Specimen: BLOOD  Result Value Ref Range Status   Specimen Description BLOOD LEFT ARM  Final   Special Requests   Final    BOTTLES DRAWN AEROBIC AND ANAEROBIC Blood Culture adequate volume   Culture  Setup Time   Final    GRAM NEGATIVE RODS IN BOTH AEROBIC AND ANAEROBIC BOTTLES Organism ID to follow CRITICAL RESULT CALLED TO, READ BACK BY AND VERIFIED WITH: Sarajane Marek [phone] Performed at [facility], [place] [address], [place], [place] [address]    Culture GRAM NEGATIVE RODS  Final   Report Status PENDING  Incomplete  Blood Culture ID Panel (Reflexed)     Status: Abnormal   Collection Time: 07/02/19 10:00 PM  Result Value Ref Range Status   Enterococcus species NOT DETECTED NOT DETECTED Final   Listeria monocytogenes NOT DETECTED NOT DETECTED Final   Staphylococcus species NOT DETECTED NOT DETECTED Final   Staphylococcus aureus (BCID) NOT DETECTED NOT DETECTED Final   Streptococcus species NOT DETECTED NOT DETECTED Final   Streptococcus agalactiae NOT DETECTED NOT DETECTED Final   Streptococcus pneumoniae NOT DETECTED NOT DETECTED Final   Streptococcus pyogenes  NOT DETECTED NOT DETECTED Final   Acinetobacter baumannii NOT DETECTED NOT DETECTED Final   Enterobacteriaceae species DETECTED (A) NOT DETECTED Final    Comment: Enterobacteriaceae represent a large family of gram-negative bacteria, not a single organism. CRITICAL RESULT CALLED TO, READ BACK BY AND VERIFIED WITH: PHARMD J FRENS [phone] MLM    Enterobacter cloacae complex NOT DETECTED NOT DETECTED Final   Escherichia coli DETECTED (A) NOT DETECTED Final    Comment: CRITICAL RESULT CALLED TO, READ BACK BY AND VERIFIED WITH: PHARMD J FRENS [phone] MLM     Klebsiella oxytoca NOT DETECTED NOT DETECTED Final   Klebsiella pneumoniae NOT DETECTED NOT DETECTED Final   Proteus species NOT DETECTED NOT DETECTED Final   Serratia marcescens NOT DETECTED NOT DETECTED Final   Carbapenem resistance NOT DETECTED NOT DETECTED Final   Haemophilus influenzae NOT DETECTED NOT DETECTED Final   Neisseria meningitidis NOT DETECTED NOT DETECTED Final  Pseudomonas aeruginosa NOT DETECTED NOT DETECTED Final   Candida albicans NOT DETECTED NOT DETECTED Final   Candida glabrata NOT DETECTED NOT DETECTED Final   Candida krusei NOT DETECTED NOT DETECTED Final   Candida parapsilosis NOT DETECTED NOT DETECTED Final   Candida tropicalis NOT DETECTED NOT DETECTED Final    Comment: Performed at [facility], [place] [address], [place], [place] [address]  SARS Coronavirus 2 (CEPHEID - Performed in [place] hospital lab), Hosp Order     Status: None   Collection Time: 07/03/19 12:15 AM   Specimen: Nasopharyngeal Swab  Result Value Ref Range Status   SARS Coronavirus 2 NEGATIVE NEGATIVE Final    Comment: (NOTE) If result is NEGATIVE SARS-CoV-2 target nucleic acids are NOT DETECTED. The SARS-CoV-2 RNA is generally detectable in upper and lower  respiratory specimens during the acute phase of infection. The lowest  concentration of SARS-CoV-2 viral copies this assay can detect is 250  copies / mL. A  negative result does not preclude SARS-CoV-2 infection  and should not be used as the sole basis for treatment or other  patient management decisions.  A negative result may occur with  improper specimen collection / handling, submission of specimen other  than nasopharyngeal swab, presence of viral mutation(s) within the  areas targeted by this assay, and inadequate number of viral copies  (<250 copies / mL). A negative result must be combined with clinical  observations, patient history, and epidemiological information. If result is POSITIVE SARS-CoV-2 target nucleic acids are DETECTED. The SARS-CoV-2 RNA is generally detectable in upper and lower  respiratory specimens dur ing the acute phase of infection.  Positive  results are indicative of active infection with SARS-CoV-2.  Clinical  correlation with patient history and other diagnostic information is  necessary to determine patient infection status.  Positive results do  not rule out bacterial infection or co-infection with other viruses. If result is PRESUMPTIVE POSTIVE SARS-CoV-2 nucleic acids MAY BE PRESENT.   A presumptive positive result was obtained on the submitted specimen  and confirmed on repeat testing.  While 2019 novel coronavirus  (SARS-CoV-2) nucleic acids may be present in the submitted sample  additional confirmatory testing may be necessary for epidemiological  and / or clinical management purposes  to differentiate between  SARS-CoV-2 and other Sarbecovirus currently known to infect humans.  If clinically indicated additional testing with an alternate test  methodology [phone]) is advised. The SARS-CoV-2 RNA is generally  detectable in upper and lower respiratory sp ecimens during the acute  phase of infection. The expected result is Negative. Fact Sheet for Patients:  StrictlyIdeas.no Fact Sheet for Healthcare Providers: BankingDealers.co.za This test is not  yet approved or cleared by the [place] FDA and has been authorized for detection and/or diagnosis of SARS-CoV-2 by FDA under an Emergency Use Authorization (EUA).  This EUA will remain in effect (meaning this test can be used) for the duration of the COVID-19 declaration under Section 564(b)(1) of the Act, 21 U.S.C. section 360bbb-3(b)(1), unless the authorization is terminated or revoked sooner. Performed at [facility], [place] [address] [place], [place] [address]      Studies: Dg Chest Portable 1 View  Result Date: 07/02/2019 CLINICAL DATA:  Incontinence and confusion EXAM: PORTABLE CHEST 1 VIEW COMPARISON:  December 28, 2018 FINDINGS: There is cardiomegaly. Mildly increased chronic interstitial lung changes seen at both bases. No pleural effusion. Aortic knob calcification. No acute osseous findings. IMPRESSION: No acute cardiopulmonary process.  Electronically Signed   By: Prudencio Pair M.D.   On: 07/02/2019 22:23   Ct Renal Stone Study  Result Date: 07/02/2019 CLINICAL DATA:  Right flank pain EXAM: CT ABDOMEN AND PELVIS WITHOUT CONTRAST TECHNIQUE: Multidetector CT imaging of the abdomen and pelvis was performed following the standard protocol without IV contrast. COMPARISON:  CT 03/25/2019, 07/23/2012 FINDINGS: Lower chest: Lung bases demonstrate no acute consolidation or effusion. Trace pericardial effusion. Coronary vascular calcification. Hepatobiliary: Nodular liver contour consistent with cirrhosis. Dilated appearing gallbladder without calcified stone. No biliary dilatation Pancreas: Unremarkable. No pancreatic ductal dilatation or surrounding inflammatory changes. Spleen: Enlarged with craniocaudad measurement of 14.2 cm. Adrenals/Urinary Tract: Right adrenal gland 12.9 mm nodule. 2.6 cm oblong left adrenal gland nodule, no change. Cortical scarring mid to upper right kidney. Status post left nephrectomy. No significant hydronephrosis. Possible punctate 1 mm stone in the  distal right ureter about a cm proximal to the right UVJ, series 3, image number 78. Stomach/Bowel: Stomach is nonenlarged. No dilated small bowel. No colon wall thickening. Status post appendectomy. Vascular/Lymphatic: Extensive aortic atherosclerosis. No aneurysmal dilatation. No significantly enlarged lymph nodes. Reproductive: Status post hysterectomy. No adnexal masses. Other: Negative for free air or free fluid. Small fat in the umbilical region Musculoskeletal: Trace anterolisthesis L4 on L5. Multiple level degenerative change. No acute or suspicious osseous abnormality IMPRESSION: 1. Possible punctate 1 mm stone distal right ureter about a cm proximal to the right UVJ but no significant right hydronephrosis 2. Status post left nephrectomy 3. Cirrhosis of the liver.  Borderline to mild splenomegaly 4. Stable bilateral adrenal gland nodules Electronically Signed   By: Donavan Foil M.D.   On: 07/02/2019 23:27    Scheduled Meds: . aspirin EC  81 mg Oral Daily  . docusate sodium  100 mg Oral Daily  . enoxaparin (LOVENOX) injection  40 mg Subcutaneous Daily  . ezetimibe-simvastatin  1 tablet Oral Daily  . hydrALAZINE  25 mg Oral BID  . insulin aspart  0-9 Units Subcutaneous TID WC  . insulin glargine  25 Units Subcutaneous QHS  . levothyroxine  25 mcg Oral Once per day on Mon Tue Wed Thu Fri Sat  . metoprolol tartrate  25 mg Oral BID  . oxybutynin  5 mg Oral Daily   Continuous Infusions: . sodium chloride    . cefTRIAXone (ROCEPHIN)  IV      Principal Problem:   Bacteremia Active Problems:   Essential hypertension   Diabetes mellitus type 2, insulin dependent (HCC)   Chronic diastolic CHF (congestive heart failure) (HCC)   UTI (urinary tract infection)   Chronic respiratory failure (HCC)   CKD (chronic kidney disease), stage III (HCC)   Hyperlipidemia   Liver cirrhosis secondary to NASH [facility])   Renal stone    Time spent: 76 minutes    [facility]   If 7PM-7AM, please contact night-coverage at [email or web address], password [facility] 07/03/2019, 12:58 PM  LOS: 0 days

## 2019-07-03 NOTE — ED Notes (Signed)
ED TO INPATIENT HANDOFF REPORT  ED Nurse Name and Phone #: Marya Amsler RN [phone]  S Name/Age/Gender Margaret Dyer 84 y.o. female Room/Bed: 045C/045C  Code Status   Code Status: Full Code  Home/SNF/Other Home Patient oriented to: Is this baseline? Yes   Triage Complete: Triage complete  Chief Complaint Possible UTI  Triage Note Pt brought to ED by [facility] for increase incontinence and confusion since today at 3 pm pt states this happens when she has a UTI. BP for EMS 193/1096, HR 97, R 18, 98% on 2 L [place], pt is home O2 [place] dependent. 350 mL NS given by EMS pta.   Allergies Allergies  Allergen Reactions  . Other Other (See Comments)    Patient has hemorrhaged at least 4 times in her lifetime  . Cefuroxime Axetil Nausea Only    Upset stomach   . Ciprofloxacin Nausea Only    Upset stomach    Level of Care/Admitting Diagnosis ED Disposition    ED Disposition Condition [facility] Area: [place] [[id number]]  Level of Care: Telemetry Medical [104]  Covid Evaluation: Confirmed COVID Negative  Diagnosis: Bacteremia [790.7.ICD-9-CM]  Admitting Physician: Geradine Girt [[id number]]  Attending Physician: Geradine Girt [phone]  Estimated length of stay: 3 - 4 days  Certification:: I certify this patient will need inpatient services for at least 2 midnights  PT Class (Do Not Modify): Inpatient [101]  PT Acc Code (Do Not Modify): Private [1]       B Medical/Surgery History Past Medical History:  Diagnosis Date  . Asthma   . Blood transfusion   . Chronic diastolic CHF (congestive heart failure) ([place])   . CKD (chronic kidney disease), stage III ([place])   . Coronary artery calcification seen on CT scan   . Diabetes mellitus   . Goiter   . Hyperlipidemia   . Hypertension   . Hypothyroidism   . Liver cirrhosis ([place])   . NASH (nonalcoholic steatohepatitis)   . Obesity   . Osteopenia   . Pulmonary hypertension ([place])   . Renal cell carcinoma    a.  prior nephrectomy.  . Right heart failure ([place])   . Thrombocytopenia ([place])    Past Surgical History:  Procedure Laterality Date  . ABDOMINAL HYSTERECTOMY    . APPENDECTOMY    . HEMORRHOID SURGERY    . INTRAMEDULLARY (IM) NAIL INTERTROCHANTERIC Left 12/25/2018   Procedure: INTRAMEDULLARY (IM) NAIL INTERTROCHANTRIC;  Surgeon: Nicholes Stairs, MD;  Location: [facility];  Service: Orthopedics;  Laterality: Left;  . KNEE SURGERY    . NEPHRECTOMY  09.2000     A IV Location/Drains/Wounds Patient Lines/Drains/Airways Status   Active Line/Drains/Airways    Name:   Placement date:   Placement time:   Site:   Days:   Peripheral IV 07/02/19 Left Antecubital   07/02/19    2049    Antecubital   1   External Urinary Catheter   12/25/18    2154    -   190   Incision (Closed) 05/18/17 Arm Right   05/18/17    1706     776   Incision (Closed) 12/25/18 Hip Left   12/25/18    1251     190   Wound / Incision (Open or Dehisced) 03/02/14 Other (Comment) Arm Left;Posterior   03/02/14    1629    Arm   1949          Intake/Output Last 24 hours  Intake/Output Summary (Last 24 hours) at 07/03/2019 1340 Last data filed at 07/03/2019 0016 Gross per 24 hour  Intake 50 ml  Output -  Net 50 ml    Labs/Imaging Results for orders placed or performed during the hospital encounter of 07/02/19 (from the past 48 hour(s))  Comprehensive metabolic panel     Status: Abnormal   Collection Time: 07/02/19  9:07 PM  Result Value Ref Range   Sodium 137 135 - 145 mmol/L   Potassium 5.4 (H) 3.5 - 5.1 mmol/L    Comment: SPECIMEN HEMOLYZED. HEMOLYSIS MAY AFFECT INTEGRITY OF RESULTS.   Chloride 104 98 - 111 mmol/L   CO2 20 (L) 22 - 32 mmol/L   Glucose, Bld 197 (H) 70 - 99 mg/dL   BUN 32 (H) 8 - 23 mg/dL   Creatinine, Ser 1.14 (H) 0.44 - 1.00 mg/dL   Calcium 9.1 8.9 - 10.3 mg/dL   Total Protein 5.9 (L) 6.5 - 8.1 g/dL   Albumin 3.5 3.5 - 5.0 g/dL   AST 64 (H) 15 - 41 U/L   ALT 32 0 - 44 U/L   Alkaline Phosphatase  62 38 - 126 U/L   Total Bilirubin 1.6 (H) 0.3 - 1.2 mg/dL   GFR calc non Af Amer 45 (L) >60 mL/min   GFR calc Af Amer 52 (L) >60 mL/min   Anion gap 13 5 - 15    Comment: Performed at [facility], [address] [place], [place] [address]  CBC with Differential     Status: Abnormal   Collection Time: 07/02/19  9:07 PM  Result Value Ref Range   WBC 7.0 4.0 - 10.5 K/uL   RBC 4.71 3.87 - 5.11 MIL/uL   Hemoglobin 14.2 12.0 - 15.0 g/dL   HCT 42.9 36.0 - 46.0 %   MCV 91.1 80.0 - 100.0 fL   MCH 30.1 26.0 - 34.0 pg   MCHC 33.1 30.0 - 36.0 g/dL   RDW 13.5 11.5 - 15.5 %   Platelets 89 (L) 150 - 400 K/uL    Comment: REPEATED TO VERIFY PLATELET COUNT CONFIRMED BY SMEAR Immature Platelet Fraction may be clinically indicated, consider ordering this additional test [id number]    nRBC 0.0 0.0 - 0.2 %   Neutrophils Relative % 94 %   Neutro Abs 6.5 1.7 - 7.7 K/uL   Lymphocytes Relative 2 %   Lymphs Abs 0.2 (L) 0.7 - 4.0 K/uL   Monocytes Relative 3 %   Monocytes Absolute 0.2 0.1 - 1.0 K/uL   Eosinophils Relative 0 %   Eosinophils Absolute 0.0 0.0 - 0.5 K/uL   Basophils Relative 0 %   Basophils Absolute 0.0 0.0 - 0.1 K/uL   Immature Granulocytes 1 %   Abs Immature Granulocytes 0.08 (H) 0.00 - 0.07 K/uL    Comment: Performed at [facility], [place] [address], [place], [place] [address]  Urinalysis, Routine w reflex microscopic     Status: Abnormal   Collection Time: 07/02/19  9:07 PM  Result Value Ref Range   Color, Urine YELLOW YELLOW   APPearance HAZY (A) CLEAR   Specific Gravity, Urine 1.016 1.005 - 1.030   pH 5.0 5.0 - 8.0   Glucose, UA 150 (A) NEGATIVE mg/dL   Hgb urine dipstick MODERATE (A) NEGATIVE   Bilirubin Urine NEGATIVE NEGATIVE   Ketones, ur NEGATIVE NEGATIVE mg/dL   Protein, ur 100 (A) NEGATIVE mg/dL   Nitrite NEGATIVE NEGATIVE   Leukocytes,Ua SMALL (A) NEGATIVE  RBC / HPF >50 (H) 0 - 5 RBC/hpf   WBC, UA 21-50 0 - 5 WBC/hpf   Bacteria, UA MANY (A) NONE SEEN    Mucus PRESENT    Hyaline Casts, UA PRESENT     Comment: Performed at [facility] [address], [place], [place] [address]  Blood culture (routine x 2)     Status: None (Preliminary result)   Collection Time: 07/02/19  9:57 PM   Specimen: BLOOD  Result Value Ref Range   Specimen Description BLOOD RIGHT HAND    Special Requests      BOTTLES DRAWN AEROBIC ONLY Blood Culture results may not be optimal due to an inadequate volume of blood received in culture bottles   Culture  Setup Time      GRAM NEGATIVE RODS AEROBIC BOTTLE ONLY CRITICAL VALUE NOTED.  VALUE IS CONSISTENT WITH PREVIOUSLY REPORTED AND CALLED VALUE. Performed at [facility], [place] [address], [place], [place] [address]    Culture GRAM NEGATIVE RODS    Report Status PENDING   Blood culture (routine x 2)     Status: None (Preliminary result)   Collection Time: 07/02/19 10:00 PM   Specimen: BLOOD  Result Value Ref Range   Specimen Description BLOOD LEFT ARM    Special Requests      BOTTLES DRAWN AEROBIC AND ANAEROBIC Blood Culture adequate volume   Culture  Setup Time      GRAM NEGATIVE RODS IN BOTH AEROBIC AND ANAEROBIC BOTTLES Organism ID to follow CRITICAL RESULT CALLED TO, READ BACK BY AND VERIFIED WITH: Sarajane Marek [id number] MLM Performed at [facility], [place] [address], [place], [place] [address]    Culture GRAM NEGATIVE RODS    Report Status PENDING   Blood Culture ID Panel (Reflexed)     Status: Abnormal   Collection Time: 07/02/19 10:00 PM  Result Value Ref Range   Enterococcus species NOT DETECTED NOT DETECTED   Listeria monocytogenes NOT DETECTED NOT DETECTED   Staphylococcus species NOT DETECTED NOT DETECTED   Staphylococcus aureus (BCID) NOT DETECTED NOT DETECTED   Streptococcus species NOT DETECTED NOT DETECTED   Streptococcus agalactiae NOT DETECTED NOT DETECTED   Streptococcus pneumoniae NOT DETECTED NOT DETECTED   Streptococcus pyogenes NOT DETECTED NOT DETECTED    Acinetobacter baumannii NOT DETECTED NOT DETECTED   Enterobacteriaceae species DETECTED (A) NOT DETECTED    Comment: Enterobacteriaceae represent a large family of gram-negative bacteria, not a single organism. CRITICAL RESULT CALLED TO, READ BACK BY AND VERIFIED WITH: PHARMD J FRENS [id number] MLM    Enterobacter cloacae complex NOT DETECTED NOT DETECTED   Escherichia coli DETECTED (A) NOT DETECTED    Comment: CRITICAL RESULT CALLED TO, READ BACK BY AND VERIFIED WITH: PHARMD J FRENS [id number] MLM     Klebsiella oxytoca NOT DETECTED NOT DETECTED   Klebsiella pneumoniae NOT DETECTED NOT DETECTED   Proteus species NOT DETECTED NOT DETECTED   Serratia marcescens NOT DETECTED NOT DETECTED   Carbapenem resistance NOT DETECTED NOT DETECTED   Haemophilus influenzae NOT DETECTED NOT DETECTED   Neisseria meningitidis NOT DETECTED NOT DETECTED   Pseudomonas aeruginosa NOT DETECTED NOT DETECTED   Candida albicans NOT DETECTED NOT DETECTED   Candida glabrata NOT DETECTED NOT DETECTED   Candida krusei NOT DETECTED NOT DETECTED   Candida parapsilosis NOT DETECTED NOT DETECTED   Candida tropicalis NOT DETECTED NOT DETECTED    Comment: Performed at [facility]  [address], [place], [place] [address]  SARS Coronavirus 2 (CEPHEID - Performed in [facility] hospital lab), Hosp Order     Status: None   Collection Time: 07/03/19 12:15 AM   Specimen: Nasopharyngeal Swab  Result Value Ref Range   SARS Coronavirus 2 NEGATIVE NEGATIVE    Comment: (NOTE) If result is NEGATIVE SARS-CoV-2 target nucleic acids are NOT DETECTED. The SARS-CoV-2 RNA is generally detectable in upper and lower  respiratory specimens during the acute phase of infection. The lowest  concentration of SARS-CoV-2 viral copies this assay can detect is 250  copies / mL. A negative result does not preclude SARS-CoV-2 infection  and should not be used as the sole basis for treatment or other  patient management decisions.   A negative result may occur with  improper specimen collection / handling, submission of specimen other  than nasopharyngeal swab, presence of viral mutation(s) within the  areas targeted by this assay, and inadequate number of viral copies  (<250 copies / mL). A negative result must be combined with clinical  observations, patient history, and epidemiological information. If result is POSITIVE SARS-CoV-2 target nucleic acids are DETECTED. The SARS-CoV-2 RNA is generally detectable in upper and lower  respiratory specimens dur ing the acute phase of infection.  Positive  results are indicative of active infection with SARS-CoV-2.  Clinical  correlation with patient history and other diagnostic information is  necessary to determine patient infection status.  Positive results do  not rule out bacterial infection or co-infection with other viruses. If result is PRESUMPTIVE POSTIVE SARS-CoV-2 nucleic acids MAY BE PRESENT.   A presumptive positive result was obtained on the submitted specimen  and confirmed on repeat testing.  While 2019 novel coronavirus  (SARS-CoV-2) nucleic acids may be present in the submitted sample  additional confirmatory testing may be necessary for epidemiological  and / or clinical management purposes  to differentiate between  SARS-CoV-2 and other Sarbecovirus currently known to infect humans.  If clinically indicated additional testing with an alternate test  methodology [phone]) is advised. The SARS-CoV-2 RNA is generally  detectable in upper and lower respiratory sp ecimens during the acute  phase of infection. The expected result is Negative. Fact Sheet for Patients:  StrictlyIdeas.no Fact Sheet for Healthcare Providers: BankingDealers.co.za This test is not yet approved or cleared by the [place] FDA and has been authorized for detection and/or diagnosis of SARS-CoV-2 by FDA under an Emergency Use  Authorization (EUA).  This EUA will remain in effect (meaning this test can be used) for the duration of the COVID-19 declaration under Section 564(b)(1) of the Act, 21 U.S.C. section 360bbb-3(b)(1), unless the authorization is terminated or revoked sooner. Performed at [facility], [place] [address], [place],  [address]   CBG monitoring, ED     Status: Abnormal   Collection Time: 07/03/19  2:06 AM  Result Value Ref Range   Glucose-Capillary 267 (H) 70 - 99 mg/dL  CBC     Status: Abnormal   Collection Time: 07/03/19  2:53 AM  Result Value Ref Range   WBC 15.5 (H) 4.0 - 10.5 K/uL   RBC 4.23 3.87 - 5.11 MIL/uL   Hemoglobin 12.8 12.0 - 15.0 g/dL   HCT 38.7 36.0 - 46.0 %   MCV 91.5 80.0 - 100.0 fL   MCH 30.3 26.0 - 34.0 pg   MCHC 33.1 30.0 - 36.0 g/dL   RDW 13.6 11.5 - 15.5 %   Platelets 87 (L) 150 -  400 K/uL    Comment: REPEATED TO VERIFY Immature Platelet Fraction may be clinically indicated, consider ordering this additional test [id number] CONSISTENT WITH PREVIOUS RESULT    nRBC 0.0 0.0 - 0.2 %    Comment: Performed at [facility], [place] [address], [place], [place] [address]  Basic metabolic panel     Status: Abnormal   Collection Time: 07/03/19  2:53 AM  Result Value Ref Range   Sodium 137 135 - 145 mmol/L   Potassium 3.7 3.5 - 5.1 mmol/L   Chloride 106 98 - 111 mmol/L   CO2 23 22 - 32 mmol/L   Glucose, Bld 271 (H) 70 - 99 mg/dL   BUN 33 (H) 8 - 23 mg/dL   Creatinine, Ser 1.29 (H) 0.44 - 1.00 mg/dL   Calcium 8.9 8.9 - 10.3 mg/dL   GFR calc non Af Amer 39 (L) >60 mL/min   GFR calc Af Amer 45 (L) >60 mL/min   Anion gap 8 5 - 15    Comment: Performed at [facility] [address] [place], [place] [address]  CBG monitoring, ED     Status: Abnormal   Collection Time: 07/03/19  7:24 AM  Result Value Ref Range   Glucose-Capillary 207 (H) 70 - 99 mg/dL   Dg Chest Portable 1 View  Result Date: 07/02/2019 CLINICAL DATA:  Incontinence and confusion  EXAM: PORTABLE CHEST 1 VIEW COMPARISON:  December 28, 2018 FINDINGS: There is cardiomegaly. Mildly increased chronic interstitial lung changes seen at both bases. No pleural effusion. Aortic knob calcification. No acute osseous findings. IMPRESSION: No acute cardiopulmonary process. Electronically Signed   By: Prudencio Pair M.D.   On: 07/02/2019 22:23   Ct Renal Stone Study  Result Date: 07/02/2019 CLINICAL DATA:  Right flank pain EXAM: CT ABDOMEN AND PELVIS WITHOUT CONTRAST TECHNIQUE: Multidetector CT imaging of the abdomen and pelvis was performed following the standard protocol without IV contrast. COMPARISON:  CT 03/25/2019, 07/23/2012 FINDINGS: Lower chest: Lung bases demonstrate no acute consolidation or effusion. Trace pericardial effusion. Coronary vascular calcification. Hepatobiliary: Nodular liver contour consistent with cirrhosis. Dilated appearing gallbladder without calcified stone. No biliary dilatation Pancreas: Unremarkable. No pancreatic ductal dilatation or surrounding inflammatory changes. Spleen: Enlarged with craniocaudad measurement of 14.2 cm. Adrenals/Urinary Tract: Right adrenal gland 12.9 mm nodule. 2.6 cm oblong left adrenal gland nodule, no change. Cortical scarring mid to upper right kidney. Status post left nephrectomy. No significant hydronephrosis. Possible punctate 1 mm stone in the distal right ureter about a cm proximal to the right UVJ, series 3, image number 78. Stomach/Bowel: Stomach is nonenlarged. No dilated small bowel. No colon wall thickening. Status post appendectomy. Vascular/Lymphatic: Extensive aortic atherosclerosis. No aneurysmal dilatation. No significantly enlarged lymph nodes. Reproductive: Status post hysterectomy. No adnexal masses. Other: Negative for free air or free fluid. Small fat in the umbilical region Musculoskeletal: Trace anterolisthesis L4 on L5. Multiple level degenerative change. No acute or suspicious osseous abnormality IMPRESSION: 1. Possible  punctate 1 mm stone distal right ureter about a cm proximal to the right UVJ but no significant right hydronephrosis 2. Status post left nephrectomy 3. Cirrhosis of the liver.  Borderline to mild splenomegaly 4. Stable bilateral adrenal gland nodules Electronically Signed   By: Donavan Foil M.D.   On: 07/02/2019 23:27    Pending Labs Unresulted Labs (From admission, onward)    Start     Ordered   07/04/19 1245  Basic metabolic panel  Tomorrow morning,   R  07/03/19 1132   07/04/19 0500  TSH  Tomorrow morning,   R     07/03/19 1254   07/04/19 0500  CBC  Tomorrow morning,   R     07/03/19 1255   07/02/19 2100  Urine culture  ONCE - STAT,   STAT     07/02/19 2059          Vitals/Pain Today's Vitals   07/03/19 0930 07/03/19 0945 07/03/19 1215 07/03/19 1216  BP:  (!) 141/47 (!) 134/45 (!) 134/45  Pulse:  68  66  Resp:  16    Temp:      TempSrc:      SpO2: 100% 100%    Weight:      Height:      PainSc:        Isolation Precautions No active isolations  Medications Medications  aspirin EC tablet 81 mg (81 mg Oral Given 07/03/19 1215)  ezetimibe-simvastatin (VYTORIN) 10-40 MG per tablet 1 tablet (1 tablet Oral Given 07/03/19 1216)  hydrALAZINE (APRESOLINE) tablet 25 mg (25 mg Oral Given 07/03/19 1215)  metoprolol tartrate (LOPRESSOR) tablet 25 mg (25 mg Oral Given 07/03/19 1216)  insulin glargine (LANTUS) injection 25 Units (25 Units Subcutaneous Given 07/03/19 0210)  levothyroxine (SYNTHROID) tablet 25 mcg (25 mcg Oral Given 07/03/19 0606)  docusate sodium (COLACE) capsule 100 mg (100 mg Oral Given 07/03/19 1216)  polyethylene glycol (MIRALAX / GLYCOLAX) packet 17 g (has no administration in time range)  oxybutynin (DITROPAN) tablet 5 mg (5 mg Oral Given 07/03/19 1216)  enoxaparin (LOVENOX) injection 40 mg (40 mg Subcutaneous Given 07/03/19 1219)  acetaminophen (TYLENOL) tablet 650 mg (has no administration in time range)    Or  acetaminophen (TYLENOL) suppository 650 mg (has no  administration in time range)  insulin aspart (novoLOG) injection 0-9 Units (3 Units Subcutaneous Given 07/03/19 0733)  promethazine (PHENERGAN) injection 12.5 mg (has no administration in time range)  0.9 %  sodium chloride infusion (has no administration in time range)  cefTRIAXone (ROCEPHIN) 2 g in sodium chloride 0.9 % 100 mL IVPB (has no administration in time range)  acetaminophen (TYLENOL) tablet 650 mg (650 mg Oral Given 07/02/19 2211)  sodium chloride 0.9 % bolus 1,000 mL (0 mLs Intravenous Stopped 07/03/19 0725)  cefTRIAXone (ROCEPHIN) 1 g in sodium chloride 0.9 % 100 mL IVPB (0 g Intravenous Stopped 07/03/19 0016)    Mobility walks Moderate fall risk   Focused Assessments    R Recommendations: See Admitting Provider Note  Report given to:   Additional Notes:

## 2019-07-03 NOTE — ED Notes (Signed)
Pt't daughter [name] updated about POC, admission and about visitor policy, family member verbalized understanding.

## 2019-07-04 LAB — CBC
HCT: 36.5 % (ref 36.0–46.0)
Hemoglobin: 12.1 g/dL (ref 12.0–15.0)
MCH: 30.8 pg (ref 26.0–34.0)
MCHC: 33.2 g/dL (ref 30.0–36.0)
MCV: 92.9 fL (ref 80.0–100.0)
Platelets: 71 10*3/uL — ABNORMAL LOW (ref 150–400)
RBC: 3.93 MIL/uL (ref 3.87–5.11)
RDW: 13.5 % (ref 11.5–15.5)
WBC: 9.5 10*3/uL (ref 4.0–10.5)
nRBC: 0 % (ref 0.0–0.2)

## 2019-07-04 LAB — BASIC METABOLIC PANEL
Anion gap: 7 (ref 5–15)
BUN: 34 mg/dL — ABNORMAL HIGH (ref 8–23)
CO2: 25 mmol/L (ref 22–32)
Calcium: 8.4 mg/dL — ABNORMAL LOW (ref 8.9–10.3)
Chloride: 109 mmol/L (ref 98–111)
Creatinine, Ser: 1.17 mg/dL — ABNORMAL HIGH (ref 0.44–1.00)
GFR calc Af Amer: 50 mL/min — ABNORMAL LOW (ref >60)
GFR calc non Af Amer: 43 mL/min — ABNORMAL LOW (ref >60)
Glucose, Bld: 161 mg/dL — ABNORMAL HIGH (ref 70–99)
Potassium: 4 mmol/L (ref 3.5–5.1)
Sodium: 141 mmol/L (ref 135–145)

## 2019-07-04 LAB — GLUCOSE, CAPILLARY
Glucose-Capillary: 210 mg/dL — ABNORMAL HIGH (ref 70–99)
Glucose-Capillary: 241 mg/dL — ABNORMAL HIGH (ref 70–99)
Glucose-Capillary: 161 mg/dL — ABNORMAL HIGH (ref 70–99)
Glucose-Capillary: 153 mg/dL — ABNORMAL HIGH (ref 70–99)
Glucose-Capillary: 151 mg/dL — ABNORMAL HIGH (ref 70–99)

## 2019-07-04 LAB — URINE CULTURE: Culture: >=100000 — AB

## 2019-07-04 LAB — HEMOGLOBIN A1C
Hgb A1c MFr Bld: 7.3 % — ABNORMAL HIGH (ref 4.8–5.6)
Mean Plasma Glucose: 162.81 mg/dL

## 2019-07-04 LAB — TSH: TSH: 1.197 u[IU]/mL (ref 0.350–4.500)

## 2019-07-04 MED ORDER — ORAL CARE MOUTH RINSE
15.0000 mL | Freq: Two times a day (BID) | OROMUCOSAL | Status: DC
  Administered 2019-07-04 – 2019-07-06 (×5): 15 mL via OROMUCOSAL

## 2019-07-04 MED ORDER — FUROSEMIDE 40 MG PO TABS
40.0000 mg | ORAL_TABLET | Freq: Every day | ORAL | Status: DC
  Administered 2019-07-04 – 2019-07-05 (×2): 40 mg via ORAL
  Filled 2019-07-04 (×2): qty 1

## 2019-07-04 MED ORDER — GLUCERNA SHAKE PO LIQD
237.0000 mL | Freq: Three times a day (TID) | ORAL | Status: DC
  Administered 2019-07-04 – 2019-07-06 (×6): 237 mL via ORAL

## 2019-07-04 MED ORDER — POTASSIUM CHLORIDE CRYS ER 20 MEQ PO TBCR
40.0000 meq | EXTENDED_RELEASE_TABLET | Freq: Once | ORAL | Status: AC
  Administered 2019-07-04: 40 meq via ORAL
  Filled 2019-07-04: qty 2

## 2019-07-04 MED ORDER — INSULIN ASPART 100 UNIT/ML ~~LOC~~ SOLN
4.0000 [IU] | Freq: Three times a day (TID) | SUBCUTANEOUS | Status: DC
  Administered 2019-07-04 – 2019-07-06 (×5): 4 [IU] via SUBCUTANEOUS

## 2019-07-04 MED ORDER — SODIUM CHLORIDE 0.9 % IV SOLN
INTRAVENOUS | Status: DC | PRN
  Administered 2019-07-04: 100 mL via INTRAVENOUS

## 2019-07-04 MED ORDER — ADULT MULTIVITAMIN W/MINERALS CH
1.0000 | ORAL_TABLET | Freq: Every day | ORAL | Status: DC
  Administered 2019-07-04 – 2019-07-06 (×3): 1 via ORAL
  Filled 2019-07-04 (×3): qty 1

## 2019-07-04 NOTE — Plan of Care (Signed)
  Problem: Urinary Elimination: Goal: Signs and symptoms of infection will decrease Outcome: [place]ing

## 2019-07-04 NOTE — Telephone Encounter (Signed)
Follow up    Patient is returning call in reference to labs and medication. She states that she has not been able to start the medication because she is currently in the hospital. She wanted to let the provider know. Please call to discuss.

## 2019-07-04 NOTE — [place] Notes (Signed)
Initial Nutrition Assessment  DOCUMENTATION CODES:   Obesity unspecified  INTERVENTION:  -Discontinue Ensure BID  - Glucerna Shake po TID, each supplement provides 220 kcal and 10 grams of protein -Encouraged increased po at meal times and educated on importance of nutrition for healing -MVI   NUTRITION DIAGNOSIS:   Inadequate oral intake related to acute illness as evidenced by energy intake < or equal to 50% for > or equal to 5 days, per patient/family report.   GOAL:   Patient will meet greater than or equal to 90% of their needs   MONITOR:   PO intake, Labs, I & O's, Supplement acceptance, Weight trends  REASON FOR ASSESSMENT:   Malnutrition Screening Tool    ASSESSMENT:  RD working remotely.  84 year old female with medical history significant of asthma, CHF on home O2, CKDIII, T2DM, HTN, HLD, hypothyroidism, coronary artery calcification, liver cirrhosis, renal cell carcinoma s/p nephrectomy, thrombocytopenia. Patient presented to ED for evaluation of UTI symptoms and ongoing nausea and admitted for observation for signs concerning of obstruction s/p CT that revealed possible punctate 64m stone in distal right ureter  Per chart review, pt complains of dysuria x 5 days and  reports chronic urinary frequency as she takes a diuretic. Daughter reports pt is noncompliant with  252mLasix BID; skips evening dose due to urinary frequency at night. Daughter reported to nurse that patient has recently been sounding a little out of breath on the phone and likely not taking her Lasix.  Spoke with patient via phone this afternoon. Patient reports decreased po since last Wednesday due to nausea. Bites of breakfast this morning and pt stated "Im just not hungry" When at home and feeling well pt endorses 2 meals/day prepared by her daughter and recalls salmon cakes and other fish, roasts, greens. She stated that her daughter is "the chief" and doesn't allow her to eat any sweets.  Patient reports daughter has helped her improve her blood sugar and lower her A1c from 11 to 7.  RD suggested ONS between meals to assist with po intake; pt reports she liked the Ensure, but it caused her blood sugar to spike and asked for an alternate supplement; RD to order Glucerna in chocolate.  Current Wt - 88.5 kg (194.7 lbs) no documented edema Wt history reviewed - pt 192 lbs in May of 2019 UBW - 180 lbs (per pt daughter)   I/O: +147 ml since admit UOP: 275 ml x 12 hrs Temp 97.5  Medications reviewed and include: Colace, Lovenox, Vytorin, Lasix 40 mg PO daily, SSI, novoLog 4 units TID, Lantus 25 units at bedtime, synthroid, KCl SA 4069mdaily  Labs: Glucose 161 BUN 34 - trending up Cr 1.17 - trending down WBC - WDL  NUTRITION - FOCUSED PHYSICAL EXAM: Unable to complete at this time   Diet Order:   Diet Order            Diet Carb Modified Fluid consistency: Thin; Room service appropriate? Yes  Diet effective now              EDUCATION NEEDS:   Education needs have been addressed  Skin:  Skin Assessment: Reviewed RN Assessment  Last BM:  7/30  Height:   Ht Readings from Last 1 Encounters:  07/03/19 _0  (1.6 m)    Weight:   Wt Readings from Last 1 Encounters:  07/04/19 88.5 kg    Ideal Body Weight:  52.3 kg  BMI:  Body mass index is  34.56 kg/m.  Estimated Nutritional Needs:   Kcal:  1962-2297  Protein:  79-93 grams  Fluid:  >1.7L   Lajuan Lines, RD, LDN  After Hours/Weekend Pager: [phone]

## 2019-07-04 NOTE — Telephone Encounter (Signed)
Returned call to patient left message on personal voice mail I will send message to Fabian Sharp PA to make her aware you are currently in hospital.Hope you are feeling better soon.

## 2019-07-04 NOTE — TOC Initial Note (Signed)
Transition of Care [facility]) - Initial/Assessment Note    Patient Details  Name: Margaret Dyer MRN: [id number] Date of Birth: [birth year]  Transition of Care [facility]) CM/SW Contact:    Benard Halsted, LCSW Phone Number: 07/04/2019, 12:53 PM  Clinical Narrative:                 CSW spoke with patient regarding possible discharge needs. Patient stated that she lives with her daughter and has oxygen set up at home as well as a portable tank for riding in the car. She reported that she would like a walker if she does not have to pay for one since the one she has at home is old. She stated that she went to [facility] for rehab in February but she prefers to discharge home this time. She is agreeable to home health if recommended and she would like [facility] since she used them before. PCP confirmed.   Expected Discharge Plan: Home/Self Care Barriers to Discharge: Continued Medical Work up   Patient Goals and CMS Choice Patient states their goals for this hospitalization and ongoing recovery are:: Return home CMS Medicare.gov Compare Post Acute Care list provided to:: Patient Choice offered to / list presented to : Patient  Expected Discharge Plan and Services Expected Discharge Plan: Home/Self Care In-house Referral: NA Discharge Planning Services: CM Consult Post Acute Care Choice: Durable Medical Equipment Living arrangements for the past 2 months: Single Family Home                                      Prior Living Arrangements/Services Living arrangements for the past 2 months: Single Family Home Lives with:: Adult Children Patient language and need for interpreter reviewed:: Yes Do you feel safe going back to the place where you live?: Yes      Need for Family Participation in Patient Care: No (Comment) Care giver support system in place?: Yes (comment)   Criminal Activity/Legal Involvement Pertinent to Current Situation/Hospitalization: No - Comment as needed  Activities of  Daily Living Home Assistive Devices/Equipment: Walker (specify type), Eyeglasses, CBG Meter, Grab bars in shower ADL Screening (condition at time of admission) Patient's cognitive ability adequate to safely complete daily activities?: Yes Is the patient deaf or have difficulty hearing?: No Does the patient have difficulty seeing, even when wearing glasses/contacts?: No Does the patient have difficulty concentrating, remembering, or making decisions?: No Patient able to express need for assistance with ADLs?: Yes Does the patient have difficulty dressing or bathing?: No Independently performs ADLs?: Yes (appropriate for developmental age) Does the patient have difficulty walking or climbing stairs?: Yes Weakness of Legs: Left Weakness of Arms/Hands: Right  Permission Sought/Granted                  Emotional Assessment Appearance:: Appears stated age Attitude/Demeanor/Rapport: Engaged, Gracious Affect (typically observed): Accepting, Appropriate, Pleasant Orientation: : Oriented to Self, Oriented to Place, Oriented to Situation, Oriented to  Time Alcohol / Substance Use: Not Applicable Psych Involvement: No (comment)  Admission diagnosis:  Lower urinary tract infectious disease [N39.0] Bacteremia [R78.81] Patient Active Problem List   Diagnosis Date Noted  . UTI (urinary tract infection) 07/03/2019  . Food poisoning 07/03/2019  . Bacteremia 07/03/2019  . Exposure to Covid-19 Virus 06/16/2019  . Closed left hip fracture ([place]) 12/24/2018  . Dysuria 12/17/2017  . Dizziness 12/17/2017  . Community acquired pneumonia 12/17/2017  .  Pneumonia 12/01/2017  . Urinary incontinence 11/09/2017  . Tic 11/09/2017  . CKD (chronic kidney disease), stage III ([place]) 09/12/2017  . Pulmonary hypertension ([place]) 09/12/2017  . Chronic diastolic CHF (congestive heart failure) ([place]) 06/05/2017  . Oral candida 05/29/2017  . Vitamin D deficiency 05/29/2017  . Chronic respiratory failure ([place])  05/24/2017  . Anxiety 05/24/2017  . HCAP (healthcare-associated pneumonia) 05/22/2017  . Right heart failure ([place]) 05/17/2017  . Acute diastolic CHF (congestive heart failure) ([place])   . Exertional dyspnea 05/13/2017  . Hypertension 05/13/2017  . Asthma 05/13/2017  . DOE (dyspnea on exertion) 05/13/2017  . Diabetes mellitus type 2, insulin dependent ([place]) 05/13/2017  . Acute kidney injury ([place]) 05/13/2017  . History of nephrectomy 05/13/2017  . Hyperlipidemia LDL goal <70 05/13/2017  . Liver cirrhosis secondary to NASH ([place]) 05/13/2017  . Thrombocytopenia ([place]) 05/13/2017  . Renal cell carcinoma 05/13/2017  . Hypothyroidism 05/13/2017  . Hypersplenism 05/13/2017  . Osteoarthritis 05/13/2017  . Overactive bladder 05/13/2017  . URI, acute 02/22/2017  . Abrasion, left knee, initial encounter 02/22/2017  . CTS (carpal tunnel syndrome) 03/04/2016  . Bilateral contusion of ribs 10/05/2015  . Dental abscess 04/15/2014  . Claudication, intermittent ([place] [address] [place]) 12/05/2013  . Obesity (BMI 30-39.9) 07/22/2013  . Back pain 05/26/2013  . Platelets decreased ([place]) 05/26/2013  . Breast pain, right 04/21/2013  . Anxiety and depression 12/05/2012  . Bronchitis 11/06/2012  . HEMATURIA UNSPECIFIED 02/17/2011  . Hyperlipidemia 09/08/2010  . B12 DEFICIENCY 07/22/2010  . DIZZINESS 07/04/2010  . Essential hypertension 06/21/2010  . GOUT, UNSPECIFIED 01/27/2010  . NEPHRECTOMY, HX OF 12/02/2009  . NECK PAIN, LEFT 09/04/2008  . PARESTHESIA 09/02/2007  . GOITER NOS 04/23/2007  . Diabetes mellitus ([place]) 04/23/2007  . Asthma 04/23/2007  . Renal stone 04/23/2007  . OSTEOPENIA 04/23/2007  . CARCINOMA, RENAL CELL 08/10/1999   PCP:  Ann Held, DO Pharmacy:   [facility] [phone] [address], [place] AT [place] [address] [place] [address] Phone: [phone] Fax: [phone]  [facility] [phone] [address] [place], [place] AT [place] [address] [place] [address] Phone: [phone] Fax: [phone]     Social Determinants of Health ([place]) Interventions    Readmission Risk Interventions No flowsheet data found.

## 2019-07-04 NOTE — Progress Notes (Addendum)
[facility] PROGRESS NOTE  Margaret Dyer XBM:[id number] DOB: [birth year] DOA: 07/02/2019 PCP: Ann Held, DO  Assessment/Plan: Acute complicated UTI. Patient has 1 kidney, history of left nephrectomy.Max temp 99.8 last 24 hours. Leukocytosis resolved this am. CT renal stone study showing possible punctate 1 mm stone in the distal right ureter about a centimeter proximal to the right UVJ but no significant right hydronephrosis. Per H &P, ED provider discussed the case with Dr. Reuben Likes urologywho reviewed the CT images and is unsure if there is actually a stone there. Recommending observation to see if patient develops signs of renal failure or signs concerning for obstruction. Creatinine trending up slightly. Urine output good.  - continue Ceftriaxone -Continue to monitor renal function, white count -follow urine culture -Tylenol PRN  Bacteremia: blood cultures gram neg rods x2. Max temp 101, wbc 15. Hemodynamically stable.  -increase rocephin to 2g -continue IV fluids -intake and output -vs q 4 x3 -supportive therapy  Chronic kidney disease stage III. Creatinine trending down today  -IV fluids as noted above -hold nephrotoxins -monitor urine output -recheck in am  Chronic respiratory failure. Recently diagnosed per chart. On home oxygen. 2 L at rest 4L with activity and sleep. No increased oxygen demand as yet. Chest xray with no acute cardiopulmonary process. -continue oxygen supplementation -monitor oxygen saturation level  Insulin-dependent type 2 diabetes. HgA1c 6.2 4/20. cbg's running high side. Appetite good.  -will add meal coverage - continue Sliding scale insulin at current level. -Continue home Lantus 25 units at bedtime  Hyperlipidemia  -Continue home Vytorin  Hypertension controlled -Continue home hydralazine, metoprolol  Hypothyroidism -Continue home Synthroid   Code Status: full Family Communication: patient  Disposition Plan:  home when ready   Consultants:    Procedures:    Antibiotics:  rocephin  HPI/Subjective: Sitting up in bed eating breakfast. Reports legs "ache" no other complaints.   Objective: Vitals:   07/04/19 0430 07/04/19 0752  BP: 130/76 137/62  Pulse: 80 65  Resp: (!) 22 20  Temp: 99.1 F (37.3 C) 99.8 F (37.7 C)  SpO2: 95% 99%    Intake/Output Summary (Last 24 hours) at 07/04/2019 1048 Last data filed at 07/04/2019 0441 Gross per 24 hour  Intake 372.59 ml  Output 275 ml  Net 97.59 ml   Filed Weights   07/02/19 2046 07/03/19 1532 07/04/19 0430  Weight: 83.5 kg 88.5 kg 88.5 kg    Exam:   General:  Awake obese no acute distress  Cardiovascular: rrr no mgr no LE edema  Respiratory: normal effort BS clear bilaterally no wheeze  Abdomen: obese +BS no guarding or rebouding  Musculoskeletal: joints without swelling/erythema   Data Reviewed: Basic Metabolic Panel: Recent Labs  Lab 07/02/19 2107 07/03/19 0253 07/04/19 0428  NA 137 137 141  K 5.4* 3.7 4.0  CL 104 106 109  CO2 20* 23 25  GLUCOSE 197* 271* 161*  BUN 32* 33* 34*  CREATININE 1.14* 1.29* 1.17*  CALCIUM 9.1 8.9 8.4*   Liver Function Tests: Recent Labs  Lab 07/02/19 2107  AST 64*  ALT 32  ALKPHOS 62  BILITOT 1.6*  PROT 5.9*  ALBUMIN 3.5   No results for input(s): LIPASE, AMYLASE in the last 168 hours. No results for input(s): AMMONIA in the last 168 hours. CBC: Recent Labs  Lab 07/02/19 2107 07/03/19 0253 07/04/19 0428  WBC 7.0 15.5* 9.5  NEUTROABS 6.5  --   --   HGB 14.2 12.8 12.1  HCT 42.9 38.7  36.5  MCV 91.1 91.5 92.9  PLT 89* 87* 71*   Cardiac Enzymes: No results for input(s): CKTOTAL, CKMB, CKMBINDEX, TROPONINI in the last 168 hours. BNP (last 3 results) No results for input(s): BNP in the last 8760 hours.  ProBNP (last 3 results) No results for input(s): PROBNP in the last 8760 hours.  CBG: Recent Labs  Lab 07/03/19 1355 07/03/19 1652 07/03/19 2247  07/04/19 0751 07/04/19 0932  GLUCAP 152* 146* 193* 210* 241*    Recent Results (from the past 240 hour(s))  Urine culture     Status: Abnormal   Collection Time: 07/02/19  9:07 PM   Specimen: Urine, Random  Result Value Ref Range Status   Specimen Description URINE, RANDOM  Final   Special Requests   Final    NONE Performed at [facility], [place] [address], [place], [place] [address]    Culture >=100,000 COLONIES/mL ESCHERICHIA COLI (A)  Final   Report Status 07/04/2019 FINAL  Final   Organism ID, Bacteria ESCHERICHIA COLI (A)  Final      Susceptibility   Escherichia coli - MIC*    AMPICILLIN <=2 SENSITIVE Sensitive     CEFAZOLIN <=4 SENSITIVE Sensitive     CEFTRIAXONE <=1 SENSITIVE Sensitive     CIPROFLOXACIN <=0.25 SENSITIVE Sensitive     GENTAMICIN <=1 SENSITIVE Sensitive     IMIPENEM <=0.25 SENSITIVE Sensitive     NITROFURANTOIN <=16 SENSITIVE Sensitive     TRIMETH/SULFA <=20 SENSITIVE Sensitive     AMPICILLIN/SULBACTAM <=2 SENSITIVE Sensitive     PIP/TAZO <=4 SENSITIVE Sensitive     Extended ESBL NEGATIVE Sensitive     * >=100,000 COLONIES/mL ESCHERICHIA COLI  Blood culture (routine x 2)     Status: None (Preliminary result)   Collection Time: 07/02/19  9:57 PM   Specimen: BLOOD  Result Value Ref Range Status   Specimen Description BLOOD RIGHT HAND  Final   Special Requests   Final    BOTTLES DRAWN AEROBIC ONLY Blood Culture results may not be optimal due to an inadequate volume of blood received in culture bottles   Culture  Setup Time   Final    GRAM NEGATIVE RODS AEROBIC BOTTLE ONLY CRITICAL VALUE NOTED.  VALUE IS CONSISTENT WITH PREVIOUSLY REPORTED AND CALLED VALUE. Performed at [facility], [place] [address] [place], [place] [address]    Culture GRAM NEGATIVE RODS  Final   Report Status PENDING  Incomplete  Blood culture (routine x 2)     Status: Abnormal (Preliminary result)   Collection Time: 07/02/19 10:00 PM   Specimen: BLOOD  Result Value  Ref Range Status   Specimen Description BLOOD LEFT ARM  Final   Special Requests   Final    BOTTLES DRAWN AEROBIC AND ANAEROBIC Blood Culture adequate volume   Culture  Setup Time   Final    GRAM NEGATIVE RODS IN BOTH AEROBIC AND ANAEROBIC BOTTLES CRITICAL RESULT CALLED TO, READ BACK BY AND VERIFIED WITH: Sarajane Marek [id number] MLM Performed at [facility], [place] [address], [place], [place] [address]    Culture ESCHERICHIA COLI (A)  Final   Report Status PENDING  Incomplete  Blood Culture ID Panel (Reflexed)     Status: Abnormal   Collection Time: 07/02/19 10:00 PM  Result Value Ref Range Status   Enterococcus species NOT DETECTED NOT DETECTED Final   Listeria monocytogenes NOT DETECTED NOT DETECTED Final   Staphylococcus species NOT DETECTED NOT DETECTED Final  Staphylococcus aureus (BCID) NOT DETECTED NOT DETECTED Final   Streptococcus species NOT DETECTED NOT DETECTED Final   Streptococcus agalactiae NOT DETECTED NOT DETECTED Final   Streptococcus pneumoniae NOT DETECTED NOT DETECTED Final   Streptococcus pyogenes NOT DETECTED NOT DETECTED Final   Acinetobacter baumannii NOT DETECTED NOT DETECTED Final   Enterobacteriaceae species DETECTED (A) NOT DETECTED Final    Comment: Enterobacteriaceae represent a large family of gram-negative bacteria, not a single organism. CRITICAL RESULT CALLED TO, READ BACK BY AND VERIFIED WITH: PHARMD J FRENS [id number] MLM    Enterobacter cloacae complex NOT DETECTED NOT DETECTED Final   Escherichia coli DETECTED (A) NOT DETECTED Final    Comment: CRITICAL RESULT CALLED TO, READ BACK BY AND VERIFIED WITH: PHARMD J FRENS [phone] MLM     Klebsiella oxytoca NOT DETECTED NOT DETECTED Final   Klebsiella pneumoniae NOT DETECTED NOT DETECTED Final   Proteus species NOT DETECTED NOT DETECTED Final   Serratia marcescens NOT DETECTED NOT DETECTED Final   Carbapenem resistance NOT DETECTED NOT DETECTED Final   Haemophilus influenzae NOT  DETECTED NOT DETECTED Final   Neisseria meningitidis NOT DETECTED NOT DETECTED Final   Pseudomonas aeruginosa NOT DETECTED NOT DETECTED Final   Candida albicans NOT DETECTED NOT DETECTED Final   Candida glabrata NOT DETECTED NOT DETECTED Final   Candida krusei NOT DETECTED NOT DETECTED Final   Candida parapsilosis NOT DETECTED NOT DETECTED Final   Candida tropicalis NOT DETECTED NOT DETECTED Final    Comment: Performed at [facility], [place] [address], [place], [place] [address]  SARS Coronavirus 2 (CEPHEID - Performed in [facility] lab), Hosp Order     Status: None   Collection Time: 07/03/19 12:15 AM   Specimen: Nasopharyngeal Swab  Result Value Ref Range Status   SARS Coronavirus 2 NEGATIVE NEGATIVE Final    Comment: (NOTE) If result is NEGATIVE SARS-CoV-2 target nucleic acids are NOT DETECTED. The SARS-CoV-2 RNA is generally detectable in upper and lower  respiratory specimens during the acute phase of infection. The lowest  concentration of SARS-CoV-2 viral copies this assay can detect is 250  copies / mL. A negative result does not preclude SARS-CoV-2 infection  and should not be used as the sole basis for treatment or other  patient management decisions.  A negative result may occur with  improper specimen collection / handling, submission of specimen other  than nasopharyngeal swab, presence of viral mutation(s) within the  areas targeted by this assay, and inadequate number of viral copies  (<250 copies / mL). A negative result must be combined with clinical  observations, patient history, and epidemiological information. If result is POSITIVE SARS-CoV-2 target nucleic acids are DETECTED. The SARS-CoV-2 RNA is generally detectable in upper and lower  respiratory specimens dur ing the acute phase of infection.  Positive  results are indicative of active infection with SARS-CoV-2.  Clinical  correlation with patient history and other diagnostic information is   necessary to determine patient infection status.  Positive results do  not rule out bacterial infection or co-infection with other viruses. If result is PRESUMPTIVE POSTIVE SARS-CoV-2 nucleic acids MAY BE PRESENT.   A presumptive positive result was obtained on the submitted specimen  and confirmed on repeat testing.  While 2019 novel coronavirus  (SARS-CoV-2) nucleic acids may be present in the submitted sample  additional confirmatory testing may be necessary for epidemiological  and / or clinical management purposes  to differentiate between  SARS-CoV-2 and other  Sarbecovirus currently known to infect humans.  If clinically indicated additional testing with an alternate test  methodology [id number]) is advised. The SARS-CoV-2 RNA is generally  detectable in upper and lower respiratory sp ecimens during the acute  phase of infection. The expected result is Negative. Fact Sheet for Patients:  StrictlyIdeas.no Fact Sheet for Healthcare Providers: BankingDealers.co.za This test is not yet approved or cleared by the [place] FDA and has been authorized for detection and/or diagnosis of SARS-CoV-2 by FDA under an Emergency Use Authorization (EUA).  This EUA will remain in effect (meaning this test can be used) for the duration of the COVID-19 declaration under Section 564(b)(1) of the Act, 21 U.S.C. section 360bbb-3(b)(1), unless the authorization is terminated or revoked sooner. Performed at [facility], [place] [address], [place], [place] [address]      Studies: Dg Chest Portable 1 View  Result Date: 07/02/2019 CLINICAL DATA:  Incontinence and confusion EXAM: PORTABLE CHEST 1 VIEW COMPARISON:  December 28, 2018 FINDINGS: There is cardiomegaly. Mildly increased chronic interstitial lung changes seen at both bases. No pleural effusion. Aortic knob calcification. No acute osseous findings. IMPRESSION: No acute cardiopulmonary  process. Electronically Signed   By: Prudencio Pair M.D.   On: 07/02/2019 22:23   Ct Renal Stone Study  Result Date: 07/02/2019 CLINICAL DATA:  Right flank pain EXAM: CT ABDOMEN AND PELVIS WITHOUT CONTRAST TECHNIQUE: Multidetector CT imaging of the abdomen and pelvis was performed following the standard protocol without IV contrast. COMPARISON:  CT 03/25/2019, 07/23/2012 FINDINGS: Lower chest: Lung bases demonstrate no acute consolidation or effusion. Trace pericardial effusion. Coronary vascular calcification. Hepatobiliary: Nodular liver contour consistent with cirrhosis. Dilated appearing gallbladder without calcified stone. No biliary dilatation Pancreas: Unremarkable. No pancreatic ductal dilatation or surrounding inflammatory changes. Spleen: Enlarged with craniocaudad measurement of 14.2 cm. Adrenals/Urinary Tract: Right adrenal gland 12.9 mm nodule. 2.6 cm oblong left adrenal gland nodule, no change. Cortical scarring mid to upper right kidney. Status post left nephrectomy. No significant hydronephrosis. Possible punctate 1 mm stone in the distal right ureter about a cm proximal to the right UVJ, series 3, image number 78. Stomach/Bowel: Stomach is nonenlarged. No dilated small bowel. No colon wall thickening. Status post appendectomy. Vascular/Lymphatic: Extensive aortic atherosclerosis. No aneurysmal dilatation. No significantly enlarged lymph nodes. Reproductive: Status post hysterectomy. No adnexal masses. Other: Negative for free air or free fluid. Small fat in the umbilical region Musculoskeletal: Trace anterolisthesis L4 on L5. Multiple level degenerative change. No acute or suspicious osseous abnormality IMPRESSION: 1. Possible punctate 1 mm stone distal right ureter about a cm proximal to the right UVJ but no significant right hydronephrosis 2. Status post left nephrectomy 3. Cirrhosis of the liver.  Borderline to mild splenomegaly 4. Stable bilateral adrenal gland nodules Electronically Signed    By: Donavan Foil M.D.   On: 07/02/2019 23:27    Scheduled Meds: . aspirin EC  81 mg Oral Daily  . docusate sodium  100 mg Oral Daily  . enoxaparin (LOVENOX) injection  40 mg Subcutaneous Daily  . ezetimibe-simvastatin  1 tablet Oral Daily  . feeding supplement (ENSURE ENLIVE)  237 mL Oral BID BM  . hydrALAZINE  25 mg Oral BID  . insulin aspart  0-9 Units Subcutaneous TID WC  . insulin glargine  25 Units Subcutaneous QHS  . levothyroxine  25 mcg Oral Once per day on Mon Tue Wed Thu Fri Sat  . mouth rinse  15 mL Mouth Rinse BID  . metoprolol tartrate  25 mg Oral BID  . oxybutynin  5 mg Oral Daily   Continuous Infusions: . cefTRIAXone (ROCEPHIN)  IV Stopped (07/03/19 2323)    Principal Problem:   Bacteremia Active Problems:   Essential hypertension   Diabetes mellitus type 2, insulin dependent (HCC)   Chronic diastolic CHF (congestive heart failure) (HCC)   UTI (urinary tract infection)   Chronic respiratory failure (HCC)   CKD (chronic kidney disease), stage III (HCC)   Hyperlipidemia   Liver cirrhosis secondary to NASH [facility]   Renal stone    Time spent: 65 minutes    [place] [facility]  If 7PM-7AM, please contact night-coverage at [email or web address], password [facility] 07/04/2019, 10:48 AM  LOS: 1 day

## 2019-07-04 NOTE — Progress Notes (Signed)
Pt's duaghter reports that pt usually takes Lasix - she thinks 9m twice a day. Pt says it's once a day. Daughter also says pt skips doses which pt denies. Pt cites difficulty with frequent voiding as why she does not want to take Lasix in the evening.  Daughter reports that pt's usual weight is 180 lbs and notes that pt sounds a little out of breath on the phone - daughter is concerned that this is how pt usually sounds when she has not been taking her Lasix.

## 2019-07-05 LAB — CBC
HCT: 34.2 % — ABNORMAL LOW (ref 36.0–46.0)
Hemoglobin: 11.3 g/dL — ABNORMAL LOW (ref 12.0–15.0)
MCH: 30.5 pg (ref 26.0–34.0)
MCHC: 33 g/dL (ref 30.0–36.0)
MCV: 92.4 fL (ref 80.0–100.0)
Platelets: 76 10*3/uL — ABNORMAL LOW (ref 150–400)
RBC: 3.7 MIL/uL — ABNORMAL LOW (ref 3.87–5.11)
RDW: 13.4 % (ref 11.5–15.5)
WBC: 7.6 10*3/uL (ref 4.0–10.5)
nRBC: 0 % (ref 0.0–0.2)

## 2019-07-05 LAB — CULTURE, BLOOD (ROUTINE X 2): Special Requests: ADEQUATE

## 2019-07-05 LAB — BASIC METABOLIC PANEL
Anion gap: 6 (ref 5–15)
BUN: 29 mg/dL — ABNORMAL HIGH (ref 8–23)
CO2: 24 mmol/L (ref 22–32)
Calcium: 8.6 mg/dL — ABNORMAL LOW (ref 8.9–10.3)
Chloride: 108 mmol/L (ref 98–111)
Creatinine, Ser: 0.96 mg/dL (ref 0.44–1.00)
GFR calc Af Amer: >60 mL/min (ref >60)
GFR calc non Af Amer: 55 mL/min — ABNORMAL LOW (ref >60)
Glucose, Bld: 157 mg/dL — ABNORMAL HIGH (ref 70–99)
Potassium: 4.7 mmol/L (ref 3.5–5.1)
Sodium: 138 mmol/L (ref 135–145)

## 2019-07-05 LAB — GLUCOSE, CAPILLARY
Glucose-Capillary: 129 mg/dL — ABNORMAL HIGH (ref 70–99)
Glucose-Capillary: 167 mg/dL — ABNORMAL HIGH (ref 70–99)
Glucose-Capillary: 183 mg/dL — ABNORMAL HIGH (ref 70–99)

## 2019-07-05 MED ORDER — CEPHALEXIN 500 MG PO CAPS
500.0000 mg | ORAL_CAPSULE | Freq: Two times a day (BID) | ORAL | Status: DC
  Administered 2019-07-05 – 2019-07-06 (×2): 500 mg via ORAL
  Filled 2019-07-05 (×2): qty 1

## 2019-07-05 NOTE — Progress Notes (Signed)
[facility] PROGRESS NOTE  Margaret Dyer JOI:[id number] DOB: 06/03/1935 DOA: 07/02/2019 PCP: Ann Held, DO  Assessment/Plan: Acute complicated UTI. Patient has 1 kidney, history of left nephrectomy.Max temp 99.8 last 24 hours. Leukocytosis resolved this am. CT renal stone study showing possible punctate 1 mm stone in the distal right ureter about a centimeter proximal to the right UVJ but no significant right hydronephrosis. Per H &P, ED provider discussed the case with Dr. Reuben Likes urologywho reviewed the CT images and is unsure if there is actually a stone there. Recommending observation to see if patient develops signs of renal failure or signs concerning for obstruction. Creatinine trending up slightly. Urine output good.  - change Ceftriaxone to PO keflex   Bacteremia: blood cultures gram neg rods x2. Max temp 101, wbc 15. Hemodynamically stable.  -increase rocephin to 2g -continue IV fluids -intake and output -vs q 4 x3 -supportive therapy  Chronic kidney disease stage III. Creatinine trending down today  -IV fluids as noted above -much improved  Chronic respiratory failure. Recently diagnosed per chart. On home oxygen. 2 L at rest 4L with activity and sleep. No increased oxygen demand as yet. Chest xray with no acute cardiopulmonary process. -continue oxygen supplementation -monitor oxygen saturation level  Insulin-dependent type 2 diabetes. HgA1c 6.2 4/20. cbg's running high side. Appetite good.  -will add meal coverage - continue Sliding scale insulin at current level. -Continue home Lantus 25 units at bedtime  Hyperlipidemia  -Continue home Vytorin  Hypertension controlled -Continue home hydralazine, metoprolol  Hypothyroidism -Continue home Synthroid   Code Status: full Family Communication: patient  Disposition Plan: home in AM if tolerating PO abx     Antibiotics:  rocephin  HPI/Subjective: Had issues in past with  antibiotics making her nauseous  Objective: Vitals:   07/05/19 0903 07/05/19 1240  BP: (!) 153/58   Pulse:  67  Resp:    Temp:    SpO2:  97%    Intake/Output Summary (Last 24 hours) at 07/05/2019 1427 Last data filed at 07/05/2019 1413 Gross per 24 hour  Intake -  Output 2400 ml  Net -2400 ml   Filed Weights   07/03/19 1532 07/04/19 0430 07/05/19 0500  Weight: 88.5 kg 88.5 kg 90 kg    Exam:   In bed, NAD  rrr  No wheezing, no increased work of breathing  A+Ox3  Data Reviewed: Basic Metabolic Panel: Recent Labs  Lab 07/02/19 2107 07/03/19 0253 07/04/19 0428 07/05/19 0215  NA 137 137 141 138  K 5.4* 3.7 4.0 4.7  CL 104 106 109 108  CO2 20* _0 GLUCOSE 197* 271* 161* 157*  BUN 32* 33* 34* 29*  CREATININE 1.14* 1.29* 1.17* 0.96  CALCIUM 9.1 8.9 8.4* 8.6*   Liver Function Tests: Recent Labs  Lab 07/02/19 2107  AST 64*  ALT 32  ALKPHOS 62  BILITOT 1.6*  PROT 5.9*  ALBUMIN 3.5   No results for input(s): LIPASE, AMYLASE in the last 168 hours. No results for input(s): AMMONIA in the last 168 hours. CBC: Recent Labs  Lab 07/02/19 2107 07/03/19 0253 07/04/19 0428 07/05/19 0215  WBC 7.0 15.5* 9.5 7.6  NEUTROABS 6.5  --   --   --   HGB 14.2 12.8 12.1 11.3*  HCT 42.9 38.7 36.5 34.2*  MCV 91.1 91.5 92.9 92.4  PLT 89* 87* 71* 76*   Cardiac Enzymes: No results for input(s): CKTOTAL, CKMB, CKMBINDEX, TROPONINI in the last 168 hours. BNP (last  3 results) No results for input(s): BNP in the last 8760 hours.  ProBNP (last 3 results) No results for input(s): PROBNP in the last 8760 hours.  CBG: Recent Labs  Lab 07/04/19 1212 07/04/19 1730 07/04/19 2107 07/05/19 0831 07/05/19 1403  GLUCAP 161* 153* 151* 129* 167*    Recent Results (from the past 240 hour(s))  Urine culture     Status: Abnormal   Collection Time: 07/02/19  9:07 PM   Specimen: Urine, Random  Result Value Ref Range Status   Specimen Description URINE, RANDOM  Final    Special Requests   Final    NONE Performed at [facility], [place] [address], [place], [place] [address]    Culture >=100,000 COLONIES/mL ESCHERICHIA COLI (A)  Final   Report Status 07/04/2019 FINAL  Final   Organism ID, Bacteria ESCHERICHIA COLI (A)  Final      Susceptibility   Escherichia coli - MIC*    AMPICILLIN <=2 SENSITIVE Sensitive     CEFAZOLIN <=4 SENSITIVE Sensitive     CEFTRIAXONE <=1 SENSITIVE Sensitive     CIPROFLOXACIN <=0.25 SENSITIVE Sensitive     GENTAMICIN <=1 SENSITIVE Sensitive     IMIPENEM <=0.25 SENSITIVE Sensitive     NITROFURANTOIN <=16 SENSITIVE Sensitive     TRIMETH/SULFA <=20 SENSITIVE Sensitive     AMPICILLIN/SULBACTAM <=2 SENSITIVE Sensitive     PIP/TAZO <=4 SENSITIVE Sensitive     Extended ESBL NEGATIVE Sensitive     * >=100,000 COLONIES/mL ESCHERICHIA COLI  Blood culture (routine x 2)     Status: Abnormal   Collection Time: 07/02/19  9:57 PM   Specimen: BLOOD  Result Value Ref Range Status   Specimen Description BLOOD RIGHT HAND  Final   Special Requests   Final    BOTTLES DRAWN AEROBIC ONLY Blood Culture results may not be optimal due to an inadequate volume of blood received in culture bottles   Culture  Setup Time   Final    GRAM NEGATIVE RODS AEROBIC BOTTLE ONLY CRITICAL VALUE NOTED.  VALUE IS CONSISTENT WITH PREVIOUSLY REPORTED AND CALLED VALUE.    Culture (A)  Final    ESCHERICHIA COLI SUSCEPTIBILITIES PERFORMED ON PREVIOUS CULTURE WITHIN THE LAST 5 DAYS. Performed at [facility], [place] [address] [place], [place] [address]    Report Status 07/05/2019 FINAL  Final  Blood culture (routine x 2)     Status: Abnormal   Collection Time: 07/02/19 10:00 PM   Specimen: BLOOD  Result Value Ref Range Status   Specimen Description BLOOD LEFT ARM  Final   Special Requests   Final    BOTTLES DRAWN AEROBIC AND ANAEROBIC Blood Culture adequate volume   Culture  Setup Time   Final    GRAM NEGATIVE RODS IN BOTH AEROBIC AND ANAEROBIC  BOTTLES CRITICAL RESULT CALLED TO, READ BACK BY AND VERIFIED WITH: Sarajane Marek [phone] MLM Performed at [facility], [place] [address], [place], [place] [address]    Culture ESCHERICHIA COLI (A)  Final   Report Status 07/05/2019 FINAL  Final   Organism ID, Bacteria ESCHERICHIA COLI  Final      Susceptibility   Escherichia coli - MIC*    AMPICILLIN <=2 SENSITIVE Sensitive     CEFAZOLIN <=4 SENSITIVE Sensitive     CEFEPIME <=1 SENSITIVE Sensitive     CEFTAZIDIME <=1 SENSITIVE Sensitive     CEFTRIAXONE <=1 SENSITIVE Sensitive     CIPROFLOXACIN <=0.25 SENSITIVE Sensitive  GENTAMICIN <=1 SENSITIVE Sensitive     IMIPENEM <=0.25 SENSITIVE Sensitive     TRIMETH/SULFA <=20 SENSITIVE Sensitive     AMPICILLIN/SULBACTAM <=2 SENSITIVE Sensitive     PIP/TAZO <=4 SENSITIVE Sensitive     Extended ESBL NEGATIVE Sensitive     * ESCHERICHIA COLI  Blood Culture ID Panel (Reflexed)     Status: Abnormal   Collection Time: 07/02/19 10:00 PM  Result Value Ref Range Status   Enterococcus species NOT DETECTED NOT DETECTED Final   Listeria monocytogenes NOT DETECTED NOT DETECTED Final   Staphylococcus species NOT DETECTED NOT DETECTED Final   Staphylococcus aureus (BCID) NOT DETECTED NOT DETECTED Final   Streptococcus species NOT DETECTED NOT DETECTED Final   Streptococcus agalactiae NOT DETECTED NOT DETECTED Final   Streptococcus pneumoniae NOT DETECTED NOT DETECTED Final   Streptococcus pyogenes NOT DETECTED NOT DETECTED Final   Acinetobacter baumannii NOT DETECTED NOT DETECTED Final   Enterobacteriaceae species DETECTED (A) NOT DETECTED Final    Comment: Enterobacteriaceae represent a large family of gram-negative bacteria, not a single organism. CRITICAL RESULT CALLED TO, READ BACK BY AND VERIFIED WITH: PHARMD J FRENS [phone] MLM    Enterobacter cloacae complex NOT DETECTED NOT DETECTED Final   Escherichia coli DETECTED (A) NOT DETECTED Final    Comment: CRITICAL RESULT CALLED TO,  READ BACK BY AND VERIFIED WITH: PHARMD J FRENS [phone] MLM     Klebsiella oxytoca NOT DETECTED NOT DETECTED Final   Klebsiella pneumoniae NOT DETECTED NOT DETECTED Final   Proteus species NOT DETECTED NOT DETECTED Final   Serratia marcescens NOT DETECTED NOT DETECTED Final   Carbapenem resistance NOT DETECTED NOT DETECTED Final   Haemophilus influenzae NOT DETECTED NOT DETECTED Final   Neisseria meningitidis NOT DETECTED NOT DETECTED Final   Pseudomonas aeruginosa NOT DETECTED NOT DETECTED Final   Candida albicans NOT DETECTED NOT DETECTED Final   Candida glabrata NOT DETECTED NOT DETECTED Final   Candida krusei NOT DETECTED NOT DETECTED Final   Candida parapsilosis NOT DETECTED NOT DETECTED Final   Candida tropicalis NOT DETECTED NOT DETECTED Final    Comment: Performed at [facility], [place] [address] [place], [place] [address]  SARS Coronavirus 2 (CEPHEID - Performed in [place] hospital lab), Hosp Order     Status: None   Collection Time: 07/03/19 12:15 AM   Specimen: Nasopharyngeal Swab  Result Value Ref Range Status   SARS Coronavirus 2 NEGATIVE NEGATIVE Final    Comment: (NOTE) If result is NEGATIVE SARS-CoV-2 target nucleic acids are NOT DETECTED. The SARS-CoV-2 RNA is generally detectable in upper and lower  respiratory specimens during the acute phase of infection. The lowest  concentration of SARS-CoV-2 viral copies this assay can detect is 250  copies / mL. A negative result does not preclude SARS-CoV-2 infection  and should not be used as the sole basis for treatment or other  patient management decisions.  A negative result may occur with  improper specimen collection / handling, submission of specimen other  than nasopharyngeal swab, presence of viral mutation(s) within the  areas targeted by this assay, and inadequate number of viral copies  (<250 copies / mL). A negative result must be combined with clinical  observations, patient history, and  epidemiological information. If result is POSITIVE SARS-CoV-2 target nucleic acids are DETECTED. The SARS-CoV-2 RNA is generally detectable in upper and lower  respiratory specimens dur ing the acute phase of infection.  Positive  results are indicative of active infection  with SARS-CoV-2.  Clinical  correlation with patient history and other diagnostic information is  necessary to determine patient infection status.  Positive results do  not rule out bacterial infection or co-infection with other viruses. If result is PRESUMPTIVE POSTIVE SARS-CoV-2 nucleic acids MAY BE PRESENT.   A presumptive positive result was obtained on the submitted specimen  and confirmed on repeat testing.  While 2019 novel coronavirus  (SARS-CoV-2) nucleic acids may be present in the submitted sample  additional confirmatory testing may be necessary for epidemiological  and / or clinical management purposes  to differentiate between  SARS-CoV-2 and other Sarbecovirus currently known to infect humans.  If clinically indicated additional testing with an alternate test  methodology [phone]) is advised. The SARS-CoV-2 RNA is generally  detectable in upper and lower respiratory sp ecimens during the acute  phase of infection. The expected result is Negative. Fact Sheet for Patients:  StrictlyIdeas.no Fact Sheet for Healthcare Providers: BankingDealers.co.za This test is not yet approved or cleared by the [place] FDA and has been authorized for detection and/or diagnosis of SARS-CoV-2 by FDA under an Emergency Use Authorization (EUA).  This EUA will remain in effect (meaning this test can be used) for the duration of the COVID-19 declaration under Section 564(b)(1) of the Act, 21 U.S.C. section 360bbb-3(b)(1), unless the authorization is terminated or revoked sooner. Performed at [facility], [place] [address] [place], [place] [address]   Culture,  blood (Routine X 2) w Reflex to ID Panel     Status: None (Preliminary result)   Collection Time: 07/04/19 11:45 AM   Specimen: BLOOD  Result Value Ref Range Status   Specimen Description BLOOD RIGHT ANTECUBITAL  Final   Special Requests   Final    BOTTLES DRAWN AEROBIC ONLY Blood Culture results may not be optimal due to an inadequate volume of blood received in culture bottles   Culture   Final    NO GROWTH < 24 HOURS Performed at [facility], [place] [address], [place], [place] [address]    Report Status PENDING  Incomplete     Studies: No results found.  Scheduled Meds: . aspirin EC  81 mg Oral Daily  . cephALEXin  500 mg Oral Q12H  . docusate sodium  100 mg Oral Daily  . enoxaparin (LOVENOX) injection  40 mg Subcutaneous Daily  . ezetimibe-simvastatin  1 tablet Oral Daily  . feeding supplement (GLUCERNA SHAKE)  237 mL Oral TID BM  . furosemide  40 mg Oral Daily  . hydrALAZINE  25 mg Oral BID  . insulin aspart  0-9 Units Subcutaneous TID WC  . insulin aspart  4 Units Subcutaneous TID WC  . insulin glargine  25 Units Subcutaneous QHS  . levothyroxine  25 mcg Oral Once per day on Mon Tue Wed Thu Fri Sat  . mouth rinse  15 mL Mouth Rinse BID  . metoprolol tartrate  25 mg Oral BID  . multivitamin with minerals  1 tablet Oral Daily  . oxybutynin  5 mg Oral Daily   Continuous Infusions: . sodium chloride 100 mL (07/04/19 2155)    Principal Problem:   Bacteremia Active Problems:   Hyperlipidemia   Essential hypertension   Renal stone   Diabetes mellitus type 2, insulin dependent (HCC)   Liver cirrhosis secondary to NASH (HCC)   Chronic respiratory failure (HCC)   Chronic diastolic CHF (congestive heart failure) (HCC)   CKD (chronic kidney disease), stage III ([place])  UTI (urinary tract infection)    Time spent: 45 minutes    [facility]  If 7PM-7AM, please contact night-coverage at [email or web address], password [facility] 07/05/2019, 2:27 PM   LOS: 2 days

## 2019-07-05 NOTE — Evaluation (Signed)
Physical Therapy Evaluation Patient Details Name: Margaret Dyer MRN: [id number] DOB: 1935/06/19 Today's Date: 07/05/2019   History of Present Illness  84 yo admitted with UTI and bacteremia. PMhx: Lt hip fx, CHF, chronic respiratory failure on home O2, renal cell CA s/p nephrectomy, CKD, HTN, HLD, DM, hypothyroidism  Clinical Impression  Pt very willing to mobilize but reports new bil LE pain since admission with only chronic LLE pain. Pt with decreased strength, function, mobility and gait who currently requires assist for getting OOB and rising from low surfaces. Pt reports daughter helped her with transfers after hip sx and will be able to at D/C. Pt will benefit from acute therapy to maximize mobility, safety and function to decrease burden of care.   SpO2 97% on 2L HR 67 with gait     Follow Up Recommendations Home health PT;Supervision for mobility/OOB    Equipment Recommendations  None recommended by PT    Recommendations for Other Services       Precautions / Restrictions Precautions Precautions: Fall Precaution Comments: incontinent      Mobility  Bed Mobility Overal bed mobility: Needs Assistance Bed Mobility: Supine to Sit     Supine to sit: Min guard;HOB elevated     General bed mobility comments: reliance on rail with increased time to pivot to edge with HOB30  Transfers Overall transfer level: Needs assistance   Transfers: Sit to/from Stand Sit to Stand: Min assist         General transfer comment: min assist to stand from bed with cues for hand placement and anterior translation. Pt able to stand from [facility] without physical assist with cues  Ambulation/Gait Ambulation/Gait assistance: Min guard Gait Distance (Feet): 50 Feet Assistive device: Rolling walker (2 wheeled) Gait Pattern/deviations: Step-through pattern;Decreased stride length;Trunk flexed   Gait velocity interpretation: 1.31 - 2.62 ft/sec, indicative of limited community  ambulator General Gait Details: pt with flexed trunk throughout with cues for posture, proximity to RW and safey with pt able to walk 10' then 22' limited by fatigue  Stairs            Wheelchair Mobility    Modified Rankin (Stroke Patients Only)       Balance Overall balance assessment: Needs assistance   Sitting balance-Leahy Scale: Fair     Standing balance support: Bilateral upper extremity supported Standing balance-Leahy Scale: Poor                               Pertinent Vitals/Pain Pain Assessment: 0-10 Pain Score: 4  Pain Location: bil hips Pain Descriptors / Indicators: Aching;Guarding Pain Intervention(s): Limited activity within patient's tolerance;Repositioned;Monitored during session    Home Living Family/patient expects to be discharged to:: Private residence Living Arrangements: Children Available Help at Discharge: Family;Available PRN/intermittently Type of Home: House Home Access: Stairs to enter Entrance Stairs-Rails: Can reach both Entrance Stairs-Number of Steps: 2 Home Layout: One level Home Equipment: Walker - 2 wheels;Bedside commode Additional Comments: uses BSC beside bed    Prior Function Level of Independence: Needs assistance   Gait / Transfers Assistance Needed: pt walks with walker household distance  ADL's / Homemaking Assistance Needed: daughter does the homemaking        Hand Dominance        Extremity/Trunk Assessment   Upper Extremity Assessment Upper Extremity Assessment: Generalized weakness    Lower Extremity Assessment Lower Extremity Assessment: Generalized weakness(pt with decreased quad strength bil)  Cervical / Trunk Assessment Cervical / Trunk Assessment: Kyphotic  Communication   Communication: No difficulties  Cognition Arousal/Alertness: Awake/alert Behavior During Therapy: WFL for tasks assessed/performed Overall Cognitive Status: Impaired/Different from baseline Area of  Impairment: Safety/judgement                         Safety/Judgement: Decreased awareness of deficits;Decreased awareness of safety            General Comments      Exercises     Assessment/Plan    PT Assessment Patient needs continued PT services  PT Problem List Decreased strength;Decreased mobility;Decreased safety awareness;Decreased activity tolerance;Decreased cognition;Decreased balance;Decreased knowledge of use of DME;Cardiopulmonary status limiting activity       PT Treatment Interventions Gait training;Therapeutic exercise;Patient/family education;Functional mobility training;DME instruction;Therapeutic activities;Stair training    PT Goals (Current goals can be found in the Care Plan section)  Acute Rehab PT Goals Patient Stated Goal: return home PT Goal Formulation: With patient Time For Goal Achievement: 07/19/19 Potential to Achieve Goals: Fair    Frequency Min 3X/week   Barriers to discharge Decreased caregiver support lives with daughter but reports she can provide limited physical assist    Co-evaluation               AM-PAC PT "6 Clicks" Mobility  Outcome Measure Help needed turning from your back to your side while in a flat bed without using bedrails?: A Little Help needed moving from lying on your back to sitting on the side of a flat bed without using bedrails?: A Little Help needed moving to and from a bed to a chair (including a wheelchair)?: A Little Help needed standing up from a chair using your arms (e.g., wheelchair or bedside chair)?: A Little Help needed to walk in hospital room?: A Little Help needed climbing 3-5 steps with a railing? : A Little 6 Click Score: 18    End of Session Equipment Utilized During Treatment: Gait belt;Oxygen Activity Tolerance: Patient tolerated treatment well Patient left: in chair;with chair alarm set;with call bell/phone within reach Nurse Communication: Mobility status PT Visit  Diagnosis: Other abnormalities of gait and mobility (R26.89);Muscle weakness (generalized) (M62.81)    Time: 1102-1140 PT Time Calculation (min) (ACUTE ONLY): 38 min   Charges:   PT Evaluation $PT Eval Moderate Complexity: 1 Mod PT Treatments $Gait Training: 8-22 mins        Margaret Dyer Pam Drown, PT Acute Rehabilitation Services Pager: [phone] Office: [place] 07/05/2019, 12:46 PM

## 2019-07-05 NOTE — Plan of Care (Signed)
  Problem: Urinary Elimination: Goal: Signs and symptoms of infection will decrease Outcome: [place]ing

## 2019-07-06 LAB — GLUCOSE, CAPILLARY
Glucose-Capillary: 107 mg/dL — ABNORMAL HIGH (ref 70–99)
Glucose-Capillary: 222 mg/dL — ABNORMAL HIGH (ref 70–99)

## 2019-07-06 MED ORDER — CEPHALEXIN 500 MG PO CAPS: 500.0000 mg | ORAL_CAPSULE | Freq: Two times a day (BID) | ORAL | 0 refills | Status: DC

## 2019-07-06 MED ORDER — POTASSIUM CHLORIDE CRYS ER 20 MEQ PO TBCR: 20.0000 meq | EXTENDED_RELEASE_TABLET | Freq: Every day | ORAL | Status: DC

## 2019-07-06 MED ORDER — ONDANSETRON HCL 4 MG PO TABS: 4.0000 mg | ORAL_TABLET | Freq: Three times a day (TID) | ORAL | 0 refills | Status: AC | PRN

## 2019-07-06 MED ORDER — INSULIN GLARGINE 100 UNIT/ML ~~LOC~~ SOLN: 25.0000 [IU] | Freq: Every day | SUBCUTANEOUS | Status: AC

## 2019-07-06 MED ORDER — ASPIRIN 81 MG PO TBEC: 81.0000 mg | DELAYED_RELEASE_TABLET | Freq: Every day | ORAL | Status: AC

## 2019-07-06 MED ORDER — FUROSEMIDE 40 MG PO TABS: 40.0000 mg | ORAL_TABLET | Freq: Every day | ORAL | Status: DC

## 2019-07-06 MED ORDER — FLUCONAZOLE 150 MG PO TABS: 150.0000 mg | ORAL_TABLET | Freq: Once | ORAL | 0 refills | Status: DC | PRN

## 2019-07-06 MED ORDER — DICLOFENAC SODIUM 1 % TD GEL
2.0000 g | Freq: Four times a day (QID) | TRANSDERMAL | Status: DC
  Filled 2019-07-06: qty 100

## 2019-07-06 NOTE — Plan of Care (Signed)
  Problem: Urinary Elimination: Goal: Signs and symptoms of infection will decrease Outcome: [place]ing

## 2019-07-06 NOTE — TOC Transition Note (Signed)
Transition of Care [facility]) - CM/SW Discharge Note   Patient Details  Name: SHERRELL WEIR MRN: [id number] Date of Birth: [birth year]  [facility]) CM/SW Contact:  Carles Collet, RN Phone Number: 07/06/2019, 10:51 AM   Clinical Narrative:    Damaris Schooner w patient. Confirmed HH choice and made referral to [facility]. Patient would like RW, order placed and requested [employer] to bring to room prior to DC. She has her daughter arranged for transport home later today. No further CM needs identified.     Final next level of care: [place] Barriers to Discharge: Continued Medical Work up   Patient Goals and CMS Choice Patient states their goals for this hospitalization and ongoing recovery are:: Return home CMS Medicare.gov Compare Post Acute Care list provided to:: Patient Choice offered to / list presented to : Patient  Discharge Placement                       Discharge Plan and Services In-house Referral: NA Discharge Planning Services: CM Consult Post Acute Care Choice: Durable Medical Equipment          DME Arranged: Walker rolling DME Agency: [employer] Date DME Agency Contacted: 07/06/19 Time DME Agency Contacted: 57 Representative spoke with at DME Agency: [place]: PT Home: [place] Date [place]: 07/06/19 Time [place]: 52 Representative spoke with at [place]: cory  Social Determinants of Health ([place]) Interventions     Readmission Risk Interventions No flowsheet data found.

## 2019-07-06 NOTE — Discharge Summary (Signed)
Physician Discharge Summary  [name] NRJ:[id number] DOB: 27-Apr-1935 DOA: 07/02/2019  PCP: Ann Held, DO  Admit date: 07/02/2019 Discharge date: 07/06/2019  Admitted From: home Discharge disposition: home   Recommendations for Outpatient Follow-Up:   1. Home health PT   Discharge Diagnosis:   Principal Problem:   Bacteremia Active Problems:   Hyperlipidemia   Essential hypertension   Renal stone   Diabetes mellitus type 2, insulin dependent ([place])   Liver cirrhosis secondary to NASH (HCC)   Chronic respiratory failure (HCC)   Chronic diastolic CHF (congestive heart failure) (HCC)   CKD (chronic kidney disease), stage III (HCC)   UTI (urinary tract infection)    Discharge Condition: Improved.  Diet recommendation: Low sodium, heart healthy.  Carbohydrate-modified  Wound care: None.  Code status: Full.   History of Present Illness:   Margaret Dyer is a 84 y.o. female with medical history significant of asthma, chronic diastolic congestive heart failure, on home oxygen, CKD stage III, type 2 diabetes, hypertension, hyperlipidemia, hypothyroidism, coronary artery calcification seen on CT, liver cirrhosis, Karlene Lineman, pulmonary hypertension, renal cell carcinoma status post nephrectomy, thrombocytopenia presenting to the hospital for evaluation of UTI symptoms.  Patient reports 5-day history of dysuria.  Reports having chronic urinary frequency as she takes a diuretic.  Reports having chills at home but is not sure if she had a fever.  Denies any flank pain.  States she felt nauseous after eating something with pimento cheese before coming into the hospital today.  She vomited just a little soon after eating.  Denies any abdominal pain or diarrhea.  She continues to feel nauseous.  No other complaints.   Hospital Course by Problem:  Acute complicated UTI. Patient has 1 kidney, history of left nephrectomy.Max temp99.8 last 24 hours. Leukocytosis resolved  this am. CT renal stone study showing possible punctate 1 mm stone in the distal right ureter about a centimeter proximal to the right UVJ but no significant right hydronephrosis. Per H &P,ED provider discussed the case with Dr. Reuben Likes urologywho reviewed the CT images and is unsure if there is actually a stone there.  -changeCeftriaxone to PO keflex x 10 days  Bacteremia: blood cultures gram neg rods x2. Max temp 101, wbc 15. Hemodynamically stable.  PO abx x 10 days total  Chronic kidney disease stage III. Creatinine trending down    Chronic respiratory failure. Recently diagnosed per chart. On home oxygen. 2 L at rest 4L with activity and sleep. No increased oxygen demand   Insulin-dependent type 2 diabetes. HgA1c 6.2 4/20. cbg's running high side. Appetite good.  -continue home meds  Hyperlipidemia  -Continue home Vytorin  Hypertension controlled -Continue home hydralazine, metoprolol  Hypothyroidism -Continue home Synthroid    Medical Consultants:      Discharge Exam:   Vitals:   07/06/19 0513 07/06/19 0848  BP: (!) 141/51   Pulse: (!) 55 65  Resp: 16   Temp: 98 F (36.7 C)   SpO2: 99%    Vitals:   07/05/19 1521 07/05/19 2127 07/06/19 0513 07/06/19 0848  BP: (!) 140/50 (!) 153/51 (!) 141/51   Pulse: (!) 55 (!) 59 (!) 55 65  Resp: _0 Temp: 98.1 F (36.7 C) 98.5 F (36.9 C) 98 F (36.7 C)   TempSrc: Oral Oral Oral   SpO2: 100% 99% 99%   Weight:      Height:        General exam: Appears  calm and comfortable.    The results of significant diagnostics from this hospitalization (including imaging, microbiology, ancillary and laboratory) are listed below for reference.     Procedures and Diagnostic Studies:   Dg Chest Portable 1 View  Result Date: 07/02/2019 CLINICAL DATA:  Incontinence and confusion EXAM: PORTABLE CHEST 1 VIEW COMPARISON:  December 28, 2018 FINDINGS: There is cardiomegaly. Mildly increased chronic interstitial  lung changes seen at both bases. No pleural effusion. Aortic knob calcification. No acute osseous findings. IMPRESSION: No acute cardiopulmonary process. Electronically Signed   By: Prudencio Pair M.D.   On: 07/02/2019 22:23   Ct Renal Stone Study  Result Date: 07/02/2019 CLINICAL DATA:  Right flank pain EXAM: CT ABDOMEN AND PELVIS WITHOUT CONTRAST TECHNIQUE: Multidetector CT imaging of the abdomen and pelvis was performed following the standard protocol without IV contrast. COMPARISON:  CT 03/25/2019, 07/23/2012 FINDINGS: Lower chest: Lung bases demonstrate no acute consolidation or effusion. Trace pericardial effusion. Coronary vascular calcification. Hepatobiliary: Nodular liver contour consistent with cirrhosis. Dilated appearing gallbladder without calcified stone. No biliary dilatation Pancreas: Unremarkable. No pancreatic ductal dilatation or surrounding inflammatory changes. Spleen: Enlarged with craniocaudad measurement of 14.2 cm. Adrenals/Urinary Tract: Right adrenal gland 12.9 mm nodule. 2.6 cm oblong left adrenal gland nodule, no change. Cortical scarring mid to upper right kidney. Status post left nephrectomy. No significant hydronephrosis. Possible punctate 1 mm stone in the distal right ureter about a cm proximal to the right UVJ, series 3, image number 78. Stomach/Bowel: Stomach is nonenlarged. No dilated small bowel. No colon wall thickening. Status post appendectomy. Vascular/Lymphatic: Extensive aortic atherosclerosis. No aneurysmal dilatation. No significantly enlarged lymph nodes. Reproductive: Status post hysterectomy. No adnexal masses. Other: Negative for free air or free fluid. Small fat in the umbilical region Musculoskeletal: Trace anterolisthesis L4 on L5. Multiple level degenerative change. No acute or suspicious osseous abnormality IMPRESSION: 1. Possible punctate 1 mm stone distal right ureter about a cm proximal to the right UVJ but no significant right hydronephrosis 2. Status  post left nephrectomy 3. Cirrhosis of the liver.  Borderline to mild splenomegaly 4. Stable bilateral adrenal gland nodules Electronically Signed   By: Donavan Foil M.D.   On: 07/02/2019 23:27     Labs:   Basic Metabolic Panel: Recent Labs  Lab 07/02/19 2107 07/03/19 0253 07/04/19 0428 07/05/19 0215  NA 137 137 141 138  K 5.4* 3.7 4.0 4.7  CL 104 106 109 108  CO2 20* _0 GLUCOSE 197* 271* 161* 157*  BUN 32* 33* 34* 29*  CREATININE 1.14* 1.29* 1.17* 0.96  CALCIUM 9.1 8.9 8.4* 8.6*   GFR Estimated Creatinine Clearance: 48.1 mL/min (by C-G formula based on SCr of 0.96 mg/dL). Liver Function Tests: Recent Labs  Lab 07/02/19 2107  AST 64*  ALT 32  ALKPHOS 62  BILITOT 1.6*  PROT 5.9*  ALBUMIN 3.5   No results for input(s): LIPASE, AMYLASE in the last 168 hours. No results for input(s): AMMONIA in the last 168 hours. Coagulation profile No results for input(s): INR, PROTIME in the last 168 hours.  CBC: Recent Labs  Lab 07/02/19 2107 07/03/19 0253 07/04/19 0428 07/05/19 0215  WBC 7.0 15.5* 9.5 7.6  NEUTROABS 6.5  --   --   --   HGB 14.2 12.8 12.1 11.3*  HCT 42.9 38.7 36.5 34.2*  MCV 91.1 91.5 92.9 92.4  PLT 89* 87* 71* 76*   Cardiac Enzymes: No results for input(s): CKTOTAL, CKMB, CKMBINDEX, TROPONINI in the last 168  hours. BNP: Invalid input(s): POCBNP CBG: Recent Labs  Lab 07/04/19 2107 07/05/19 0831 07/05/19 1403 07/05/19 2125 07/06/19 0807  GLUCAP 151* 129* 167* 183* 107*   D-Dimer No results for input(s): DDIMER in the last 72 hours. Hgb A1c Recent Labs    07/04/19 0428  HGBA1C 7.3*   Lipid Profile No results for input(s): CHOL, HDL, LDLCALC, TRIG, CHOLHDL, LDLDIRECT in the last 72 hours. Thyroid function studies Recent Labs    07/04/19 0428  TSH 1.197   Anemia work up No results for input(s): VITAMINB12, FOLATE, FERRITIN, TIBC, IRON, RETICCTPCT in the last 72 hours. Microbiology Recent Results (from the past 240 hour(s))    Urine culture     Status: Abnormal   Collection Time: 07/02/19  9:07 PM   Specimen: Urine, Random  Result Value Ref Range Status   Specimen Description URINE, RANDOM  Final   Special Requests   Final    NONE Performed at [facility], [address], [place], [place] [address]    Culture >=100,000 COLONIES/mL ESCHERICHIA COLI (A)  Final   Report Status 07/04/2019 FINAL  Final   Organism ID, Bacteria ESCHERICHIA COLI (A)  Final      Susceptibility   Escherichia coli - MIC*    AMPICILLIN <=2 SENSITIVE Sensitive     CEFAZOLIN <=4 SENSITIVE Sensitive     CEFTRIAXONE <=1 SENSITIVE Sensitive     CIPROFLOXACIN <=0.25 SENSITIVE Sensitive     GENTAMICIN <=1 SENSITIVE Sensitive     IMIPENEM <=0.25 SENSITIVE Sensitive     NITROFURANTOIN <=16 SENSITIVE Sensitive     TRIMETH/SULFA <=20 SENSITIVE Sensitive     AMPICILLIN/SULBACTAM <=2 SENSITIVE Sensitive     PIP/TAZO <=4 SENSITIVE Sensitive     Extended ESBL NEGATIVE Sensitive     * >=100,000 COLONIES/mL ESCHERICHIA COLI  Blood culture (routine x 2)     Status: Abnormal   Collection Time: 07/02/19  9:57 PM   Specimen: BLOOD  Result Value Ref Range Status   Specimen Description BLOOD RIGHT HAND  Final   Special Requests   Final    BOTTLES DRAWN AEROBIC ONLY Blood Culture results may not be optimal due to an inadequate volume of blood received in culture bottles   Culture  Setup Time   Final    GRAM NEGATIVE RODS AEROBIC BOTTLE ONLY CRITICAL VALUE NOTED.  VALUE IS CONSISTENT WITH PREVIOUSLY REPORTED AND CALLED VALUE.    Culture (A)  Final    ESCHERICHIA COLI SUSCEPTIBILITIES PERFORMED ON PREVIOUS CULTURE WITHIN THE LAST 5 DAYS. Performed at [facility], [place] [address], [place], [place] [address]    Report Status 07/05/2019 FINAL  Final  Blood culture (routine x 2)     Status: Abnormal   Collection Time: 07/02/19 10:00 PM   Specimen: BLOOD  Result Value Ref Range Status   Specimen Description BLOOD LEFT ARM  Final    Special Requests   Final    BOTTLES DRAWN AEROBIC AND ANAEROBIC Blood Culture adequate volume   Culture  Setup Time   Final    GRAM NEGATIVE RODS IN BOTH AEROBIC AND ANAEROBIC BOTTLES CRITICAL RESULT CALLED TO, READ BACK BY AND VERIFIED WITH: Sarajane Marek [id number] MLM Performed at [facility], [place] [address] [place], [place] [address]    Culture ESCHERICHIA COLI (A)  Final   Report Status 07/05/2019 FINAL  Final   Organism ID, Bacteria ESCHERICHIA COLI  Final      Susceptibility   Escherichia  coli - MIC*    AMPICILLIN <=2 SENSITIVE Sensitive     CEFAZOLIN <=4 SENSITIVE Sensitive     CEFEPIME <=1 SENSITIVE Sensitive     CEFTAZIDIME <=1 SENSITIVE Sensitive     CEFTRIAXONE <=1 SENSITIVE Sensitive     CIPROFLOXACIN <=0.25 SENSITIVE Sensitive     GENTAMICIN <=1 SENSITIVE Sensitive     IMIPENEM <=0.25 SENSITIVE Sensitive     TRIMETH/SULFA <=20 SENSITIVE Sensitive     AMPICILLIN/SULBACTAM <=2 SENSITIVE Sensitive     PIP/TAZO <=4 SENSITIVE Sensitive     Extended ESBL NEGATIVE Sensitive     * ESCHERICHIA COLI  Blood Culture ID Panel (Reflexed)     Status: Abnormal   Collection Time: 07/02/19 10:00 PM  Result Value Ref Range Status   Enterococcus species NOT DETECTED NOT DETECTED Final   Listeria monocytogenes NOT DETECTED NOT DETECTED Final   Staphylococcus species NOT DETECTED NOT DETECTED Final   Staphylococcus aureus (BCID) NOT DETECTED NOT DETECTED Final   Streptococcus species NOT DETECTED NOT DETECTED Final   Streptococcus agalactiae NOT DETECTED NOT DETECTED Final   Streptococcus pneumoniae NOT DETECTED NOT DETECTED Final   Streptococcus pyogenes NOT DETECTED NOT DETECTED Final   Acinetobacter baumannii NOT DETECTED NOT DETECTED Final   Enterobacteriaceae species DETECTED (A) NOT DETECTED Final    Comment: Enterobacteriaceae represent a large family of gram-negative bacteria, not a single organism. CRITICAL RESULT CALLED TO, READ BACK BY AND VERIFIED WITH: PHARMD J  FRENS [id number] MLM    Enterobacter cloacae complex NOT DETECTED NOT DETECTED Final   Escherichia coli DETECTED (A) NOT DETECTED Final    Comment: CRITICAL RESULT CALLED TO, READ BACK BY AND VERIFIED WITH: PHARMD J FRENS [id number] MLM     Klebsiella oxytoca NOT DETECTED NOT DETECTED Final   Klebsiella pneumoniae NOT DETECTED NOT DETECTED Final   Proteus species NOT DETECTED NOT DETECTED Final   Serratia marcescens NOT DETECTED NOT DETECTED Final   Carbapenem resistance NOT DETECTED NOT DETECTED Final   Haemophilus influenzae NOT DETECTED NOT DETECTED Final   Neisseria meningitidis NOT DETECTED NOT DETECTED Final   Pseudomonas aeruginosa NOT DETECTED NOT DETECTED Final   Candida albicans NOT DETECTED NOT DETECTED Final   Candida glabrata NOT DETECTED NOT DETECTED Final   Candida krusei NOT DETECTED NOT DETECTED Final   Candida parapsilosis NOT DETECTED NOT DETECTED Final   Candida tropicalis NOT DETECTED NOT DETECTED Final    Comment: Performed at [facility], [place] [address] [place], [place] [address]  SARS Coronavirus 2 (CEPHEID - Performed in [place] hospital lab), Hosp Order     Status: None   Collection Time: 07/03/19 12:15 AM   Specimen: Nasopharyngeal Swab  Result Value Ref Range Status   SARS Coronavirus 2 NEGATIVE NEGATIVE Final    Comment: (NOTE) If result is NEGATIVE SARS-CoV-2 target nucleic acids are NOT DETECTED. The SARS-CoV-2 RNA is generally detectable in upper and lower  respiratory specimens during the acute phase of infection. The lowest  concentration of SARS-CoV-2 viral copies this assay can detect is 250  copies / mL. A negative result does not preclude SARS-CoV-2 infection  and should not be used as the sole basis for treatment or other  patient management decisions.  A negative result may occur with  improper specimen collection / handling, submission of specimen other  than nasopharyngeal swab, presence of viral mutation(s) within the    areas targeted by this assay, and inadequate number of viral copies  (<250 copies /  mL). A negative result must be combined with clinical  observations, patient history, and epidemiological information. If result is POSITIVE SARS-CoV-2 target nucleic acids are DETECTED. The SARS-CoV-2 RNA is generally detectable in upper and lower  respiratory specimens dur ing the acute phase of infection.  Positive  results are indicative of active infection with SARS-CoV-2.  Clinical  correlation with patient history and other diagnostic information is  necessary to determine patient infection status.  Positive results do  not rule out bacterial infection or co-infection with other viruses. If result is PRESUMPTIVE POSTIVE SARS-CoV-2 nucleic acids MAY BE PRESENT.   A presumptive positive result was obtained on the submitted specimen  and confirmed on repeat testing.  While 2019 novel coronavirus  (SARS-CoV-2) nucleic acids may be present in the submitted sample  additional confirmatory testing may be necessary for epidemiological  and / or clinical management purposes  to differentiate between  SARS-CoV-2 and other Sarbecovirus currently known to infect humans.  If clinically indicated additional testing with an alternate test  methodology [phone]) is advised. The SARS-CoV-2 RNA is generally  detectable in upper and lower respiratory sp ecimens during the acute  phase of infection. The expected result is Negative. Fact Sheet for Patients:  StrictlyIdeas.no Fact Sheet for Healthcare Providers: BankingDealers.co.za This test is not yet approved or cleared by the [place] FDA and has been authorized for detection and/or diagnosis of SARS-CoV-2 by FDA under an Emergency Use Authorization (EUA).  This EUA will remain in effect (meaning this test can be used) for the duration of the COVID-19 declaration under Section 564(b)(1) of the Act, 21  U.S.C. section 360bbb-3(b)(1), unless the authorization is terminated or revoked sooner. Performed at [facility], [place] [address], [place], [place] [address]   Culture, blood (Routine X 2) w Reflex to ID Panel     Status: None (Preliminary result)   Collection Time: 07/04/19 11:45 AM   Specimen: BLOOD  Result Value Ref Range Status   Specimen Description BLOOD RIGHT ANTECUBITAL  Final   Special Requests   Final    BOTTLES DRAWN AEROBIC ONLY Blood Culture results may not be optimal due to an inadequate volume of blood received in culture bottles   Culture   Final    NO GROWTH 2 DAYS Performed at [facility], [place] [address] [place], [place] [address]    Report Status PENDING  Incomplete     Discharge Instructions:   Discharge Instructions    Diet - low sodium heart healthy   Complete by: As directed    Diet Carb Modified   Complete by: As directed    Increase activity slowly   Complete by: As directed      Allergies as of 07/06/2019      Reactions   Other Other (See Comments)   Patient has hemorrhaged at least 4 times in her lifetime   Cefuroxime Axetil Nausea Only   Upset stomach   Ciprofloxacin Nausea Only   Upset stomach      Medication List    STOP taking these medications   gabapentin 100 MG capsule Commonly known as: NEURONTIN     TAKE these medications   Apidra SoloStar 100 UNIT/ML Solostar Pen Generic drug: Insulin Glulisine Inject 10 Units into the skin 3 (three) times daily before meals.   aspirin 81 MG EC tablet Take 1 tablet (81 mg total) by mouth daily. Start taking on: July 07, 2019 What changed: how much to take  CENTRUM SILVER ULTRA WOMENS PO Take 1 tablet by mouth at bedtime.   cephALEXin 500 MG capsule Commonly known as: KEFLEX Take 1 capsule (500 mg total) by mouth every 12 (twelve) hours.   docusate sodium 100 MG capsule Commonly known as: COLACE Take 1 capsule (100 mg total) by mouth 2 (two) times daily. What  changed:   when to take this  additional instructions   ezetimibe-simvastatin 10-40 MG tablet Commonly known as: VYTORIN Take 1 tablet by mouth daily.   fluconazole 150 MG tablet Commonly known as: Diflucan Take 1 tablet (150 mg total) by mouth once as needed for up to 1 dose (yeast infection).   furosemide 40 MG tablet Commonly known as: LASIX Take 1 tablet (40 mg total) by mouth daily. What changed: See the new instructions.   hydrALAZINE 25 MG tablet Commonly known as: APRESOLINE Take 1 tablet (25 mg total) by mouth every 8 (eight) hours. Take 1 tablet in the morning and half tablet in the evenings What changed:   when to take this  additional instructions   insulin glargine 100 UNIT/ML injection Commonly known as: LANTUS Inject 0.25 mLs (25 Units total) into the skin at bedtime.   levothyroxine 25 MCG tablet Commonly known as: SYNTHROID take 1 tablet by mouth daily What changed:   when to take this  additional instructions   lisinopril 5 MG tablet Commonly known as: ZESTRIL Take 1 tablet (5 mg total) by mouth daily.   metoprolol tartrate 25 MG tablet Commonly known as: LOPRESSOR TAKE 1 TABLET BY MOUTH TWICE DAILY   ondansetron 4 MG tablet Commonly known as: Zofran Take 1 tablet (4 mg total) by mouth every 8 (eight) hours as needed for nausea or vomiting.   oxybutynin 5 MG tablet Commonly known as: DITROPAN TAKE 1 TABLET BY MOUTH EVERY MORNING What changed: when to take this   polyethylene glycol powder 17 GM/SCOOP powder Commonly known as: GLYCOLAX/MIRALAX Take 17 g by mouth daily as needed for mild constipation.   potassium chloride SA 20 MEQ tablet Commonly known as: K-DUR Take 1 tablet (20 mEq total) by mouth at bedtime.   spironolactone 25 MG tablet Commonly known as: ALDACTONE Take 0.5 tablets (12.5 mg total) by mouth daily.   traMADol 50 MG tablet Commonly known as: ULTRAM Take 1 tablet (50 mg total) by mouth every 6 (six) hours as  needed for moderate pain.         Time coordinating discharge: 35 min  Signed:  Geradine Girt DO  [facility] 07/06/2019, 10:35 AM

## 2019-07-06 NOTE — Evaluation (Signed)
Occupational Therapy Evaluation Patient Details Name: Margaret Dyer MRN: [id number] DOB: [birth year] Today's Date: 07/06/2019    History of Present Illness 84 yo admitted with UTI and bacteremia. PMhx: Lt hip fx, CHF, chronic respiratory failure on home O2, renal cell CA s/p nephrectomy, CKD, HTN, HLD, DM, hypothyroidism   Clinical Impression   Pt PTA: pt living with daughter with supportive care; pt performing own ADL and daughter assisting with IADL. Pt currently performing ADL tasks with superivisonA to minA overall. Pt mobilizing well with RW with minguardA to [place]. Pt ambulating in room and bathroom with minimal pain. Pt with poor activity tolerance and would benefit from continued OT skilled services in [facility] setting. OT signing off. Please re-order as appropriate.  Pt would benefit from a RW for mobility as pt unable to safely maneuver without external supports.    Follow Up Recommendations  Home health OT;Supervision - Intermittent    Equipment Recommendations  Other (comment)(rolling walker)    Recommendations for Other Services       Precautions / Restrictions Precautions Precautions: Fall Restrictions Weight Bearing Restrictions: No      Mobility Bed Mobility Overal bed mobility: Needs Assistance Bed Mobility: Supine to Sit     Supine to sit: Supervision     General bed mobility comments: reliance on rail with increased time to pivot to edge with HOB30  Transfers Overall transfer level: Needs assistance Equipment used: Rolling walker (2 wheeled) Transfers: Sit to/from Stand Sit to Stand: Min guard         General transfer comment: minguardA for power up    Balance Overall balance assessment: Needs assistance   Sitting balance-Leahy Scale: Good     Standing balance support: During functional activity;Single extremity supported Standing balance-Leahy Scale: Fair Standing balance comment: standing at sink for light grooming                            ADL either performed or assessed with clinical judgement   ADL Overall ADL's : Needs assistance/impaired Eating/Feeding: Modified independent;Sitting   Grooming: Set up;Standing   Upper Body Bathing: Set up;Sitting   Lower Body Bathing: Minimal assistance;Sitting/lateral leans;Sit to/from stand   Upper Body Dressing : Set up;Sitting   Lower Body Dressing: Minimal assistance;Sitting/lateral leans;Sit to/from stand   Toilet Transfer: Min guard;Comfort height toilet;Grab bars;RW;Ambulation   Writer and Hygiene: Min guard;Cueing for safety;Cueing for sequencing;Sitting/lateral lean;Sit to/from stand       Functional mobility during ADLs: Min guard;Rolling walker;Cueing for safety;Cueing for sequencing       Vision Baseline Vision/History: No visual deficits Vision Assessment?: No apparent visual deficits     Perception     Praxis      Pertinent Vitals/Pain Pain Assessment: Faces Faces Pain Scale: Hurts little more Pain Location: bil hips Pain Descriptors / Indicators: Aching;Guarding Pain Intervention(s): Limited activity within patient's tolerance     Hand Dominance Right   Extremity/Trunk Assessment Upper Extremity Assessment Upper Extremity Assessment: Generalized weakness   Lower Extremity Assessment Lower Extremity Assessment: Defer to PT evaluation;Generalized weakness   Cervical / Trunk Assessment Cervical / Trunk Assessment: Kyphotic   Communication Communication Communication: No difficulties   Cognition Arousal/Alertness: Awake/alert Behavior During Therapy: WFL for tasks assessed/performed Overall Cognitive Status: Impaired/Different from baseline Area of Impairment: Safety/judgement                         Safety/Judgement: Decreased awareness of  deficits;Decreased awareness of safety         General Comments  VSS    Exercises     Shoulder Instructions      Home Living  Family/patient expects to be discharged to:: Private residence Living Arrangements: Children Available Help at Discharge: Family;Available PRN/intermittently Type of Home: House Home Access: Stairs to enter [employer] of Steps: 2 Entrance Stairs-Rails: Can reach both Home Layout: One level     Bathroom Shower/Tub: Teacher, early years/pre: Standard     Home Equipment: Environmental consultant - 2 wheels;Bedside commode   Additional Comments: uses BSC beside bed      Prior Functioning/Environment Level of Independence: Needs assistance  Gait / Transfers Assistance Needed: pt walks with walker household distance ADL's / Homemaking Assistance Needed: Pt reports supervisionA with ADL and daughter assists with IADL            OT Problem List: Decreased activity tolerance      OT Treatment/Interventions:      OT Goals(Current goals can be found in the care plan section) Acute Rehab OT Goals Patient Stated Goal: return home  OT Frequency:     Barriers to D/C:            Co-evaluation              AM-PAC OT "6 Clicks" Daily Activity     Outcome Measure Help from another person eating meals?: None Help from another person taking care of personal grooming?: None Help from another person toileting, which includes using toliet, bedpan, or urinal?: A Little Help from another person bathing (including washing, rinsing, drying)?: A Little Help from another person to put on and taking off regular upper body clothing?: None Help from another person to put on and taking off regular lower body clothing?: A Little 6 Click Score: 21   End of Session Equipment Utilized During Treatment: Gait belt;Rolling walker Nurse Communication: Mobility status  Activity Tolerance: Patient tolerated treatment well Patient left: in chair;with call bell/phone within reach;with chair alarm set  OT Visit Diagnosis: Unsteadiness on feet (R26.81);Muscle weakness (generalized) (M62.81)                 Time: 0802-2336 OT Time Calculation (min): 25 min Charges:  OT General Charges $OT Visit: 1 Visit OT Evaluation $OT Eval Moderate Complexity: 1 Mod OT Treatments $Self Care/Home Management : 8-22 mins  Ebony Hail Harold Hedge) Marsa Aris OTR/L Acute Rehabilitation Services Pager: [phone] Office: [place] 07/06/2019, 12:43 PM

## 2019-07-06 NOTE — Progress Notes (Signed)
Nsg Discharge Note  Admit Date:  07/02/2019 Discharge date: 07/06/2019   Margaret Dyer to be D/C'd Home per MD order.  AVS completed.  Copy for chart, and copy for patient signed, and dated. Patient able to verbalize understanding.  Discharge Medication: Allergies as of 07/06/2019      Reactions   Other Other (See Comments)   Patient has hemorrhaged at least 4 times in her lifetime   Cefuroxime Axetil Nausea Only   Upset stomach   Ciprofloxacin Nausea Only   Upset stomach      Medication List    STOP taking these medications   gabapentin 100 MG capsule Commonly known as: NEURONTIN     TAKE these medications   Apidra SoloStar 100 UNIT/ML Solostar Pen Generic drug: Insulin Glulisine Inject 10 Units into the skin 3 (three) times daily before meals.   aspirin 81 MG EC tablet Take 1 tablet (81 mg total) by mouth daily. Start taking on: July 07, 2019 What changed: how much to take   CENTRUM SILVER ULTRA WOMENS PO Take 1 tablet by mouth at bedtime.   cephALEXin 500 MG capsule Commonly known as: KEFLEX Take 1 capsule (500 mg total) by mouth every 12 (twelve) hours.   docusate sodium 100 MG capsule Commonly known as: COLACE Take 1 capsule (100 mg total) by mouth 2 (two) times daily. What changed:   when to take this  additional instructions   ezetimibe-simvastatin 10-40 MG tablet Commonly known as: VYTORIN Take 1 tablet by mouth daily.   fluconazole 150 MG tablet Commonly known as: Diflucan Take 1 tablet (150 mg total) by mouth once as needed for up to 1 dose (yeast infection).   furosemide 40 MG tablet Commonly known as: LASIX Take 1 tablet (40 mg total) by mouth daily. What changed: See the new instructions.   hydrALAZINE 25 MG tablet Commonly known as: APRESOLINE Take 1 tablet (25 mg total) by mouth every 8 (eight) hours. Take 1 tablet in the morning and half tablet in the evenings What changed:   when to take this  additional instructions   insulin  glargine 100 UNIT/ML injection Commonly known as: LANTUS Inject 0.25 mLs (25 Units total) into the skin at bedtime.   levothyroxine 25 MCG tablet Commonly known as: SYNTHROID take 1 tablet by mouth daily What changed:   when to take this  additional instructions   lisinopril 5 MG tablet Commonly known as: ZESTRIL Take 1 tablet (5 mg total) by mouth daily.   metoprolol tartrate 25 MG tablet Commonly known as: LOPRESSOR TAKE 1 TABLET BY MOUTH TWICE DAILY   ondansetron 4 MG tablet Commonly known as: Zofran Take 1 tablet (4 mg total) by mouth every 8 (eight) hours as needed for nausea or vomiting.   oxybutynin 5 MG tablet Commonly known as: DITROPAN TAKE 1 TABLET BY MOUTH EVERY MORNING What changed: when to take this   polyethylene glycol powder 17 GM/SCOOP powder Commonly known as: GLYCOLAX/MIRALAX Take 17 g by mouth daily as needed for mild constipation.   potassium chloride SA 20 MEQ tablet Commonly known as: K-DUR Take 1 tablet (20 mEq total) by mouth at bedtime.   spironolactone 25 MG tablet Commonly known as: ALDACTONE Take 0.5 tablets (12.5 mg total) by mouth daily.   traMADol 50 MG tablet Commonly known as: ULTRAM Take 1 tablet (50 mg total) by mouth every 6 (six) hours as needed for moderate pain.            Durable  Medical Equipment  (From admission, onward)         Start     Ordered   07/06/19 1051  For home use only DME Walker rolling  Once    Question:  Patient needs a walker to treat with the following condition  Answer:  Weakness   07/06/19 1051          Discharge Assessment: Vitals:   07/06/19 0513 07/06/19 0848  BP: (!) 141/51   Pulse: (!) 55 65  Resp: 16   Temp: 98 F (36.7 C)   SpO2: 99%    Skin clean, dry and intact without evidence of skin break down, no evidence of skin tears noted. IV catheter discontinued intact. Site without signs and symptoms of complications - no redness or edema noted at insertion site, patient denies  c/o pain - only slight tenderness at site.  Dressing with slight pressure applied.  D/c Instructions-Education: Discharge instructions given to patient with verbalized understanding. D/c education completed with patient including follow up instructions, medication list, d/c activities limitations if indicated, with other d/c instructions as indicated by MD - patient able to verbalize understanding, all questions fully answered. Patient instructed to return to ED, call 911, or call MD for any changes in condition.  Patient escorted via [address], and D/C home via private auto.  Hassan Rowan, RN 07/06/2019 2:56 PM

## 2019-07-07 ENCOUNTER — Telehealth: Payer: Self-pay | Admitting: *Deleted

## 2019-07-07 ENCOUNTER — Other Ambulatory Visit: Payer: Self-pay

## 2019-07-07 NOTE — Telephone Encounter (Signed)
LVM to call office.

## 2019-07-08 ENCOUNTER — Telehealth: Payer: Self-pay | Admitting: *Deleted

## 2019-07-08 DIAGNOSIS — E039 Hypothyroidism, unspecified: Secondary | ICD-10-CM | POA: Diagnosis not present

## 2019-07-08 DIAGNOSIS — I130 Hypertensive heart and chronic kidney disease with heart failure and stage 1 through stage 4 chronic kidney disease, or unspecified chronic kidney disease: Secondary | ICD-10-CM | POA: Diagnosis not present

## 2019-07-08 DIAGNOSIS — I5032 Chronic diastolic (congestive) heart failure: Secondary | ICD-10-CM | POA: Diagnosis not present

## 2019-07-08 DIAGNOSIS — J9611 Chronic respiratory failure with hypoxia: Secondary | ICD-10-CM | POA: Diagnosis not present

## 2019-07-08 DIAGNOSIS — I2510 Atherosclerotic heart disease of native coronary artery without angina pectoris: Secondary | ICD-10-CM | POA: Diagnosis not present

## 2019-07-08 DIAGNOSIS — I700 Atherosclerosis of aorta: Secondary | ICD-10-CM | POA: Diagnosis not present

## 2019-07-08 DIAGNOSIS — I272 Pulmonary hypertension, unspecified: Secondary | ICD-10-CM | POA: Diagnosis not present

## 2019-07-08 DIAGNOSIS — B9689 Other specified bacterial agents as the cause of diseases classified elsewhere: Secondary | ICD-10-CM | POA: Diagnosis not present

## 2019-07-08 DIAGNOSIS — M1030 Gout due to renal impairment, unspecified site: Secondary | ICD-10-CM | POA: Diagnosis not present

## 2019-07-08 DIAGNOSIS — E1122 Type 2 diabetes mellitus with diabetic chronic kidney disease: Secondary | ICD-10-CM | POA: Diagnosis not present

## 2019-07-08 DIAGNOSIS — E1142 Type 2 diabetes mellitus with diabetic polyneuropathy: Secondary | ICD-10-CM | POA: Diagnosis not present

## 2019-07-08 DIAGNOSIS — E1151 Type 2 diabetes mellitus with diabetic peripheral angiopathy without gangrene: Secondary | ICD-10-CM | POA: Diagnosis not present

## 2019-07-08 DIAGNOSIS — K744 Secondary biliary cirrhosis: Secondary | ICD-10-CM | POA: Diagnosis not present

## 2019-07-08 DIAGNOSIS — J45909 Unspecified asthma, uncomplicated: Secondary | ICD-10-CM | POA: Diagnosis not present

## 2019-07-08 DIAGNOSIS — N183 Chronic kidney disease, stage 3 (moderate): Secondary | ICD-10-CM | POA: Diagnosis not present

## 2019-07-08 DIAGNOSIS — I2584 Coronary atherosclerosis due to calcified coronary lesion: Secondary | ICD-10-CM | POA: Diagnosis not present

## 2019-07-08 DIAGNOSIS — R161 Splenomegaly, not elsewhere classified: Secondary | ICD-10-CM | POA: Diagnosis not present

## 2019-07-08 DIAGNOSIS — K7581 Nonalcoholic steatohepatitis (NASH): Secondary | ICD-10-CM | POA: Diagnosis not present

## 2019-07-08 DIAGNOSIS — I50810 Right heart failure, unspecified: Secondary | ICD-10-CM | POA: Diagnosis not present

## 2019-07-08 DIAGNOSIS — E278 Other specified disorders of adrenal gland: Secondary | ICD-10-CM | POA: Diagnosis not present

## 2019-07-08 DIAGNOSIS — D696 Thrombocytopenia, unspecified: Secondary | ICD-10-CM | POA: Diagnosis not present

## 2019-07-08 DIAGNOSIS — A059 Bacterial foodborne intoxication, unspecified: Secondary | ICD-10-CM | POA: Diagnosis not present

## 2019-07-08 DIAGNOSIS — E559 Vitamin D deficiency, unspecified: Secondary | ICD-10-CM | POA: Diagnosis not present

## 2019-07-08 DIAGNOSIS — N200 Calculus of kidney: Secondary | ICD-10-CM | POA: Diagnosis not present

## 2019-07-08 DIAGNOSIS — N390 Urinary tract infection, site not specified: Secondary | ICD-10-CM | POA: Diagnosis not present

## 2019-07-08 NOTE — Telephone Encounter (Signed)
Pt called requesting that we call her daughter to arrange VV coming up [phone]

## 2019-07-08 NOTE — Telephone Encounter (Signed)
LVM for pt to call office to sched appt. 2nd attempt.

## 2019-07-09 LAB — CULTURE, BLOOD (ROUTINE X 2): Culture: NO GROWTH

## 2019-07-10 ENCOUNTER — Other Ambulatory Visit: Payer: Self-pay

## 2019-07-10 ENCOUNTER — Ambulatory Visit: Payer: Self-pay

## 2019-07-10 ENCOUNTER — Encounter: Payer: Self-pay | Admitting: Family Medicine

## 2019-07-10 ENCOUNTER — Emergency Department (HOSPITAL_COMMUNITY)
Admission: EM | Admit: 2019-07-10 | Discharge: 2019-07-10 | Disposition: A | Discharge status: Home / Self Care | Payer: Medicare Other | Attending: Emergency Medicine | Admitting: Emergency Medicine

## 2019-07-10 ENCOUNTER — Ambulatory Visit (INDEPENDENT_AMBULATORY_CARE_PROVIDER_SITE_OTHER): Payer: Medicare Other | Admitting: Family Medicine

## 2019-07-10 ENCOUNTER — Encounter (HOSPITAL_COMMUNITY): Payer: Self-pay

## 2019-07-10 ENCOUNTER — Emergency Department (HOSPITAL_COMMUNITY): Payer: Medicare Other

## 2019-07-10 VITALS — BP 113/65 | HR 70 | Temp 98.8°F

## 2019-07-10 DIAGNOSIS — E1165 Type 2 diabetes mellitus with hyperglycemia: Secondary | ICD-10-CM

## 2019-07-10 DIAGNOSIS — N183 Chronic kidney disease, stage 3 (moderate): Secondary | ICD-10-CM | POA: Insufficient documentation

## 2019-07-10 DIAGNOSIS — Z79899 Other long term (current) drug therapy: Secondary | ICD-10-CM | POA: Insufficient documentation

## 2019-07-10 DIAGNOSIS — C649 Malignant neoplasm of unspecified kidney, except renal pelvis: Secondary | ICD-10-CM

## 2019-07-10 DIAGNOSIS — Z03818 Encounter for observation for suspected exposure to other biological agents ruled out: Secondary | ICD-10-CM | POA: Diagnosis not present

## 2019-07-10 DIAGNOSIS — D696 Thrombocytopenia, unspecified: Secondary | ICD-10-CM | POA: Diagnosis not present

## 2019-07-10 DIAGNOSIS — S02401S Maxillary fracture, unspecified, sequela: Secondary | ICD-10-CM | POA: Diagnosis not present

## 2019-07-10 DIAGNOSIS — E1122 Type 2 diabetes mellitus with diabetic chronic kidney disease: Secondary | ICD-10-CM | POA: Insufficient documentation

## 2019-07-10 DIAGNOSIS — Z85528 Personal history of other malignant neoplasm of kidney: Secondary | ICD-10-CM | POA: Diagnosis not present

## 2019-07-10 DIAGNOSIS — I959 Hypotension, unspecified: Secondary | ICD-10-CM | POA: Diagnosis not present

## 2019-07-10 DIAGNOSIS — R10811 Right upper quadrant abdominal tenderness: Secondary | ICD-10-CM | POA: Diagnosis not present

## 2019-07-10 DIAGNOSIS — E039 Hypothyroidism, unspecified: Secondary | ICD-10-CM | POA: Diagnosis not present

## 2019-07-10 DIAGNOSIS — Z20828 Contact with and (suspected) exposure to other viral communicable diseases: Secondary | ICD-10-CM | POA: Diagnosis not present

## 2019-07-10 DIAGNOSIS — I10 Essential (primary) hypertension: Secondary | ICD-10-CM

## 2019-07-10 DIAGNOSIS — S02401A Maxillary fracture, unspecified, initial encounter for closed fracture: Secondary | ICD-10-CM | POA: Insufficient documentation

## 2019-07-10 DIAGNOSIS — R531 Weakness: Secondary | ICD-10-CM | POA: Diagnosis not present

## 2019-07-10 DIAGNOSIS — I5032 Chronic diastolic (congestive) heart failure: Secondary | ICD-10-CM | POA: Insufficient documentation

## 2019-07-10 DIAGNOSIS — R112 Nausea with vomiting, unspecified: Secondary | ICD-10-CM | POA: Insufficient documentation

## 2019-07-10 DIAGNOSIS — Z794 Long term (current) use of insulin: Secondary | ICD-10-CM | POA: Diagnosis not present

## 2019-07-10 DIAGNOSIS — R197 Diarrhea, unspecified: Secondary | ICD-10-CM | POA: Insufficient documentation

## 2019-07-10 DIAGNOSIS — Z743 Need for continuous supervision: Secondary | ICD-10-CM | POA: Diagnosis not present

## 2019-07-10 DIAGNOSIS — R079 Chest pain, unspecified: Secondary | ICD-10-CM | POA: Diagnosis not present

## 2019-07-10 DIAGNOSIS — R10812 Left upper quadrant abdominal tenderness: Secondary | ICD-10-CM | POA: Diagnosis not present

## 2019-07-10 DIAGNOSIS — Z905 Acquired absence of kidney: Secondary | ICD-10-CM | POA: Insufficient documentation

## 2019-07-10 DIAGNOSIS — I130 Hypertensive heart and chronic kidney disease with heart failure and stage 1 through stage 4 chronic kidney disease, or unspecified chronic kidney disease: Secondary | ICD-10-CM | POA: Insufficient documentation

## 2019-07-10 DIAGNOSIS — I739 Peripheral vascular disease, unspecified: Secondary | ICD-10-CM | POA: Diagnosis not present

## 2019-07-10 DIAGNOSIS — Z9181 History of falling: Secondary | ICD-10-CM

## 2019-07-10 DIAGNOSIS — N390 Urinary tract infection, site not specified: Secondary | ICD-10-CM

## 2019-07-10 DIAGNOSIS — R279 Unspecified lack of coordination: Secondary | ICD-10-CM | POA: Diagnosis not present

## 2019-07-10 DIAGNOSIS — Z7982 Long term (current) use of aspirin: Secondary | ICD-10-CM | POA: Insufficient documentation

## 2019-07-10 DIAGNOSIS — R110 Nausea: Secondary | ICD-10-CM | POA: Diagnosis not present

## 2019-07-10 LAB — CBC WITH DIFFERENTIAL/PLATELET
Abs Immature Granulocytes: 0.18 10*3/uL — ABNORMAL HIGH (ref 0.00–0.07)
Basophils Absolute: 0 10*3/uL (ref 0.0–0.1)
Basophils Relative: 0 %
Eosinophils Absolute: 0.1 10*3/uL (ref 0.0–0.5)
Eosinophils Relative: 1 %
HCT: 43 % (ref 36.0–46.0)
Hemoglobin: 14.2 g/dL (ref 12.0–15.0)
Immature Granulocytes: 1 %
Lymphocytes Relative: 12 %
Lymphs Abs: 1.8 10*3/uL (ref 0.7–4.0)
MCH: 29.9 pg (ref 26.0–34.0)
MCHC: 33 g/dL (ref 30.0–36.0)
MCV: 90.5 fL (ref 80.0–100.0)
Monocytes Absolute: 1.1 10*3/uL — ABNORMAL HIGH (ref 0.1–1.0)
Monocytes Relative: 8 %
Neutro Abs: 11 10*3/uL — ABNORMAL HIGH (ref 1.7–7.7)
Neutrophils Relative %: 78 %
Platelets: 178 10*3/uL (ref 150–400)
RBC: 4.75 MIL/uL (ref 3.87–5.11)
RDW: 13.7 % (ref 11.5–15.5)
WBC: 14.2 10*3/uL — ABNORMAL HIGH (ref 4.0–10.5)
nRBC: 0 % (ref 0.0–0.2)

## 2019-07-10 LAB — COMPREHENSIVE METABOLIC PANEL
ALT: 23 U/L (ref 0–44)
AST: 20 U/L (ref 15–41)
Albumin: 3.1 g/dL — ABNORMAL LOW (ref 3.5–5.0)
Alkaline Phosphatase: 61 U/L (ref 38–126)
Anion gap: 12 (ref 5–15)
BUN: 37 mg/dL — ABNORMAL HIGH (ref 8–23)
CO2: 22 mmol/L (ref 22–32)
Calcium: 8.8 mg/dL — ABNORMAL LOW (ref 8.9–10.3)
Chloride: 101 mmol/L (ref 98–111)
Creatinine, Ser: 1.57 mg/dL — ABNORMAL HIGH (ref 0.44–1.00)
GFR calc Af Amer: 35 mL/min — ABNORMAL LOW (ref >60)
GFR calc non Af Amer: 30 mL/min — ABNORMAL LOW (ref >60)
Glucose, Bld: 192 mg/dL — ABNORMAL HIGH (ref 70–99)
Potassium: 4.3 mmol/L (ref 3.5–5.1)
Sodium: 135 mmol/L (ref 135–145)
Total Bilirubin: 0.9 mg/dL (ref 0.3–1.2)
Total Protein: 5.8 g/dL — ABNORMAL LOW (ref 6.5–8.1)

## 2019-07-10 LAB — URINALYSIS, ROUTINE W REFLEX MICROSCOPIC
Bilirubin Urine: NEGATIVE
Glucose, UA: NEGATIVE mg/dL
Hgb urine dipstick: NEGATIVE
Ketones, ur: NEGATIVE mg/dL
Nitrite: NEGATIVE
Protein, ur: 100 mg/dL — AB
Specific Gravity, Urine: 1.024 (ref 1.005–1.030)
pH: 5 (ref 5.0–8.0)

## 2019-07-10 LAB — LACTIC ACID, PLASMA: Lactic Acid, Venous: 1.3 mmol/L (ref 0.5–1.9)

## 2019-07-10 LAB — LIPASE, BLOOD: Lipase: 23 U/L (ref 11–51)

## 2019-07-10 MED ORDER — ONDANSETRON HCL 4 MG/2ML IJ SOLN
4.0000 mg | Freq: Once | INTRAMUSCULAR | Status: DC
  Filled 2019-07-10: qty 2

## 2019-07-10 MED ORDER — SODIUM CHLORIDE 0.9 % IV BOLUS: 1000.0000 mL | Freq: Once | INTRAVENOUS | Status: DC

## 2019-07-10 MED ORDER — ONDANSETRON 4 MG PO TBDP: 4.0000 mg | ORAL_TABLET | Freq: Three times a day (TID) | ORAL | 0 refills | Status: DC | PRN

## 2019-07-10 NOTE — Telephone Encounter (Signed)
Pt daughter called stating that her mother has been being treated for a UTI.  She had a virtual visit with Dr Lawson Radar today. Pt is taking Keflex from previous trip to ER. Daughter is concerned because her mother has developed a low grade fever. She has vomited once or twice today. She has had several watery BM's. Daughter states her mother is weak. Pt will transport to ER  Via EMS per daughter for evaluation of symptoms.  Care advice read to daughter. She verbalized understanding. Daughter will arrange EMS for transport.  Reason for Disposition . [1] Drinking very little AND [2] dehydration suspected (e.g., no urine > 12 hours, very dry mouth, very lightheaded)  Answer Assessment - Initial Assessment Questions 1. DIARRHEA SEVERITY: "How bad is the diarrhea?" "How many extra stools have you had in the past 24 hours than normal?"    - NO DIARRHEA (SCALE 0)   - MILD (SCALE 1-3): Few loose or mushy BMs; increase of 1-3 stools over normal daily number of stools; mild increase in ostomy output.   -  MODERATE (SCALE 4-7): Increase of 4-6 stools daily over normal; moderate increase in ostomy output. * SEVERE (SCALE 8-10; OR 'WORST POSSIBLE'): Increase of 7 or more stools daily over normal; moderate increase in ostomy output; incontinence.     4 watery 2. ONSET: "When did the diarrhea begin?"      This am 3. BM CONSISTENCY: "How loose or watery is the diarrhea?"      watery 4. VOMITING: "Are you also vomiting?" If so, ask: "How many times in the past 24 hours?"     yes 5. ABDOMINAL PAIN: "Are you having any abdominal pain?" If yes: "What does it feel like?" (e.g., crampy, dull, intermittent, constant)      cramping 6. ABDOMINAL PAIN SEVERITY: If present, ask: "How bad is the pain?"  (e.g., Scale 1-10; mild, moderate, or severe)   - MILD (1-3): doesn't interfere with normal activities, abdomen soft and not tender to touch    - MODERATE (4-7): interferes with normal activities or awakens from sleep,  tender to touch    - SEVERE (8-10): excruciating pain, doubled over, unable to do any normal activities       mild 7. ORAL INTAKE: If vomiting, "Have you been able to drink liquids?" "How much fluids have you had in the past 24 hours?"     Little oral intke 8. HYDRATION: "Any signs of dehydration?" (e.g., dry mouth [not just dry lips], too weak to stand, dizziness, new weight loss) "When did you last urinate?"     weak 9. EXPOSURE: "Have you traveled to a foreign country recently?" "Have you been exposed to anyone with diarrhea?" "Could you have eaten any food that was spoiled?"    N/A 10. ANTIBIOTIC USE: "Are you taking antibiotics now or have you taken antibiotics in the past 2 months?"      keflex 11. OTHER SYMPTOMS: "Do you have any other symptoms?" (e.g., fever, blood in stool)      99 12. PREGNANCY: "Is there any chance you are pregnant?" "When was your last menstrual period?"      N/A  Protocols used: DIARRHEA-A-AH

## 2019-07-10 NOTE — ED Notes (Signed)
Ptar called for pt

## 2019-07-10 NOTE — ED Provider Notes (Signed)
[facility] EMERGENCY DEPARTMENT Provider Note   CSN: [id number] Arrival date & time: 07/10/19  [place]    History   Chief Complaint Chief Complaint  Patient presents with  . Nausea  . Diarrhea    HPI Margaret Dyer is a 84 y.o. female.     84 yo F with a cc of nausea and diarrhea.  Occurred this morning. Now resolved.  Improved with zofran.  Watery stool.  Unsure if dark or bloody.  Some crampy abdominal pain with it.  Recently in the hospital for a complicated uti.  On keflex currently.  She thinks she has 4 days of antibiotics left.    The history is provided by the patient.  Illness Severity:  Moderate Onset quality:  Gradual Timing:  Constant Progression:  Worsening Chronicity:  New Associated symptoms: abdominal pain, diarrhea and nausea   Associated symptoms: no chest pain, no congestion, no fever, no headaches, no myalgias, no rhinorrhea, no shortness of breath, no vomiting and no wheezing     Past Medical History:  Diagnosis Date  . Asthma   . Blood transfusion   . Chronic diastolic CHF (congestive heart failure) ([place])   . CKD (chronic kidney disease), stage III ([place])   . Coronary artery calcification seen on CT scan   . Diabetes mellitus   . Goiter   . Hyperlipidemia   . Hypertension   . Hypothyroidism   . Liver cirrhosis ([place])   . NASH (nonalcoholic steatohepatitis)   . Obesity   . Osteopenia   . Pulmonary hypertension ([place])   . Renal cell carcinoma    a. prior nephrectomy.  . Right heart failure ([place])   . Thrombocytopenia [facility])     Patient Active Problem List   Diagnosis Date Noted  . Closed fracture of maxillary sinus ([place]) 07/10/2019  . UTI (urinary tract infection) 07/03/2019  . Food poisoning 07/03/2019  . Bacteremia 07/03/2019  . Exposure to Covid-19 Virus 06/16/2019  . Closed left hip fracture ([place]) 12/24/2018  . Dysuria 12/17/2017  . Dizziness 12/17/2017  . Community acquired pneumonia 12/17/2017  . Pneumonia  12/01/2017  . Urinary incontinence 11/09/2017  . Tic 11/09/2017  . CKD (chronic kidney disease), stage III ([place]) 09/12/2017  . Pulmonary hypertension ([place]) 09/12/2017  . Chronic diastolic CHF (congestive heart failure) ([place]) 06/05/2017  . Oral candida 05/29/2017  . Vitamin D deficiency 05/29/2017  . Chronic respiratory failure ([place]) 05/24/2017  . Anxiety 05/24/2017  . HCAP (healthcare-associated pneumonia) 05/22/2017  . Right heart failure ([place]) 05/17/2017  . Acute diastolic CHF (congestive heart failure) ([place])   . Exertional dyspnea 05/13/2017  . Hypertension 05/13/2017  . Asthma 05/13/2017  . DOE (dyspnea on exertion) 05/13/2017  . Diabetes mellitus type 2, insulin dependent ([place]) 05/13/2017  . Acute kidney injury ([place]) 05/13/2017  . History of nephrectomy 05/13/2017  . Hyperlipidemia LDL goal <70 05/13/2017  . Liver cirrhosis secondary to NASH ([place]) 05/13/2017  . Thrombocytopenia ([place]) 05/13/2017  . Renal cell carcinoma 05/13/2017  . Hypothyroidism 05/13/2017  . Hypersplenism 05/13/2017  . Osteoarthritis 05/13/2017  . Overactive bladder 05/13/2017  . URI, acute 02/22/2017  . Abrasion, left knee, initial encounter 02/22/2017  . CTS (carpal tunnel syndrome) 03/04/2016  . Bilateral contusion of ribs 10/05/2015  . Dental abscess 04/15/2014  . Claudication, intermittent ([place]) 12/05/2013  . Obesity (BMI 30-39.9) 07/22/2013  . Back pain 05/26/2013  . Platelets decreased ([place]) 05/26/2013  . Breast pain, right 04/21/2013  . Anxiety and depression 12/05/2012  .  Bronchitis 11/06/2012  . HEMATURIA UNSPECIFIED 02/17/2011  . Hyperlipidemia 09/08/2010  . B12 DEFICIENCY 07/22/2010  . DIZZINESS 07/04/2010  . Essential hypertension 06/21/2010  . GOUT, UNSPECIFIED 01/27/2010  . NEPHRECTOMY, HX OF 12/02/2009  . NECK PAIN, LEFT 09/04/2008  . PARESTHESIA 09/02/2007  . GOITER NOS 04/23/2007  . Diabetes mellitus ([place]) 04/23/2007  . Asthma 04/23/2007  . Renal stone 04/23/2007  .  OSTEOPENIA 04/23/2007  . CARCINOMA, RENAL CELL 08/10/1999    Past Surgical History:  Procedure Laterality Date  . ABDOMINAL HYSTERECTOMY    . APPENDECTOMY    . HEMORRHOID SURGERY    . INTRAMEDULLARY (IM) NAIL INTERTROCHANTERIC Left 12/25/2018   Procedure: INTRAMEDULLARY (IM) NAIL INTERTROCHANTRIC;  Surgeon: Nicholes Stairs, MD;  Location: [facility];  Service: Orthopedics;  Laterality: Left;  . KNEE SURGERY    . NEPHRECTOMY  09.2000     OB History   No obstetric history on file.      Home Medications    Prior to Admission medications   Medication Sig Start Date End Date Taking? Authorizing Provider  aspirin EC 81 MG EC tablet Take 1 tablet (81 mg total) by mouth daily. 07/07/19   Geradine Girt, DO  cephALEXin (KEFLEX) 500 MG capsule Take 1 capsule (500 mg total) by mouth every 12 (twelve) hours. 07/06/19   Geradine Girt, DO  docusate sodium (COLACE) 100 MG capsule Take 1 capsule (100 mg total) by mouth 2 (two) times daily. Patient taking differently: Take 100 mg by mouth See admin instructions. Take 1 tablet every day can take a second tablet as needed for constipation 12/30/18   Dana Allan I, MD  ezetimibe-simvastatin (VYTORIN) 10-40 MG tablet Take 1 tablet by mouth daily.    [provider]  fluconazole (DIFLUCAN) 150 MG tablet Take 1 tablet (150 mg total) by mouth once as needed for up to 1 dose (yeast infection). 07/06/19   Geradine Girt, DO  furosemide (LASIX) 40 MG tablet Take 1 tablet (40 mg total) by mouth daily. 07/06/19   Geradine Girt, DO  hydrALAZINE (APRESOLINE) 25 MG tablet Take 1 tablet (25 mg total) by mouth every 8 (eight) hours. Take 1 tablet in the morning and half tablet in the evenings Patient taking differently: Take 25 mg by mouth 2 (two) times a day.  05/15/19   Hilty, Nadean Corwin, MD  insulin glargine (LANTUS) 100 UNIT/ML injection Inject 0.25 mLs (25 Units total) into the skin at bedtime. 07/06/19   Geradine Girt, DO  Insulin Glulisine (APIDRA  SOLOSTAR) 100 UNIT/ML Solostar Pen Inject 10 Units into the skin 3 (three) times daily before meals.    [provider]  levothyroxine (SYNTHROID, LEVOTHROID) 25 MCG tablet take 1 tablet by mouth daily Patient taking differently: Take 25 mcg by mouth See admin instructions. Take 25 mcg by mouth before breakfast on Mon/Tues/Wed/Thurs/Fri/Sat 07/13/17   Carollee Herter, Alferd Apa, DO  lisinopril (ZESTRIL) 5 MG tablet Take 1 tablet (5 mg total) by mouth daily. 07/02/19 09/30/19  Ledora Bottcher, PA  metoprolol tartrate (LOPRESSOR) 25 MG tablet TAKE 1 TABLET BY MOUTH TWICE DAILY Patient taking differently: Take 25 mg by mouth 2 (two) times daily.  03/04/19   Ann Held, DO  Multiple Vitamins-Minerals (CENTRUM SILVER ULTRA WOMENS PO) Take 1 tablet by mouth at bedtime.     [provider]  ondansetron (ZOFRAN ODT) 4 MG disintegrating tablet Take 1 tablet (4 mg total) by mouth every 8 (eight) hours as needed  for nausea or vomiting. 07/10/19   Deno Etienne, DO  ondansetron (ZOFRAN) 4 MG tablet Take 1 tablet (4 mg total) by mouth every 8 (eight) hours as needed for nausea or vomiting. 07/06/19 07/05/20  Geradine Girt, DO  oxybutynin (DITROPAN) 5 MG tablet TAKE 1 TABLET BY MOUTH EVERY MORNING Patient taking differently: Take 5 mg by mouth daily.  06/26/19   Roma Schanz R, DO  polyethylene glycol powder (GLYCOLAX/MIRALAX) powder Take 17 g by mouth daily as needed for mild constipation.    [provider]  potassium chloride SA (K-DUR) 20 MEQ tablet Take 1 tablet (20 mEq total) by mouth at bedtime. 07/06/19 10/04/19  Geradine Girt, DO  spironolactone (ALDACTONE) 25 MG tablet Take 0.5 tablets (12.5 mg total) by mouth daily. 03/26/19 07/02/19  Hilty, Nadean Corwin, MD  traMADol (ULTRAM) 50 MG tablet Take 1 tablet (50 mg total) by mouth every 6 (six) hours as needed for moderate pain. 06/13/19   Shelda Pal, DO    Family History Family History  Problem Relation Age of  Onset  . Cancer Sister        bladder  . Kidney cancer Father   . Cancer Father        renal  . COPD Father   . Congestive Heart Failure Father   . Coronary artery disease Other   . Diabetes Other   . Hyperlipidemia Other   . Hypertension Other   . Rheumatologic disease Neg Hx     Social History Social History   Tobacco Use  . Smoking status: Former Research scientist (life sciences)  . Smokeless tobacco: Never Used  . Tobacco comment: 05/30/18 none in 40 years  Substance Use Topics  . Alcohol use: No    Alcohol/week: 0.0 standard drinks  . Drug use: No     Allergies   Other, Cefuroxime axetil, and Ciprofloxacin   Review of Systems Review of Systems  Constitutional: Negative for chills and fever.  HENT: Negative for congestion and rhinorrhea.   Eyes: Negative for redness and visual disturbance.  Respiratory: Negative for shortness of breath and wheezing.   Cardiovascular: Negative for chest pain and palpitations.  Gastrointestinal: Positive for abdominal pain, diarrhea and nausea. Negative for vomiting.  Genitourinary: Negative for dysuria and urgency.  Musculoskeletal: Negative for arthralgias and myalgias.  Skin: Negative for pallor and wound.  Neurological: Negative for dizziness and headaches.     Physical Exam Updated Vital Signs BP (!) 121/58   Pulse 67   Temp 98.4 F (36.9 C) (Oral)   Resp 14   Ht 5' 3.5" (1.613 m)   Wt 83.5 kg   LMP  (LMP Unknown)   SpO2 98%   BMI 32.08 kg/m   Physical Exam Vitals signs and nursing note reviewed.  Constitutional:      General: She is not in acute distress.    Appearance: She is well-developed. She is not diaphoretic.  HENT:     Head: Normocephalic and atraumatic.  Eyes:     Pupils: Pupils are equal, round, and reactive to light.  Neck:     Musculoskeletal: Normal range of motion and neck supple.  Cardiovascular:     Rate and Rhythm: Normal rate and regular rhythm.     Heart sounds: No murmur. No friction rub. No gallop.    Pulmonary:     Effort: Pulmonary effort is normal.     Breath sounds: No wheezing or rales.  Abdominal:     General: There is no  distension.     Palpations: Abdomen is soft.     Tenderness: There is abdominal tenderness (upper, mild).  Musculoskeletal:        General: No tenderness.  Skin:    General: Skin is warm and dry.  Neurological:     Mental Status: She is alert and oriented to person, place, and time.  Psychiatric:        Behavior: Behavior normal.      ED Treatments / Results  Labs (all labs ordered are listed, but only abnormal results are displayed) Labs Reviewed  CBC WITH DIFFERENTIAL/PLATELET - Abnormal; Notable for the following components:      Result Value   WBC 14.2 (*)    Neutro Abs 11.0 (*)    Monocytes Absolute 1.1 (*)    Abs Immature Granulocytes 0.18 (*)    All other components within normal limits  COMPREHENSIVE METABOLIC PANEL - Abnormal; Notable for the following components:   Glucose, Bld 192 (*)    BUN 37 (*)    Creatinine, Ser 1.57 (*)    Calcium 8.8 (*)    Total Protein 5.8 (*)    Albumin 3.1 (*)    GFR calc non Af Amer 30 (*)    GFR calc Af Amer 35 (*)    All other components within normal limits  URINALYSIS, ROUTINE W REFLEX MICROSCOPIC - Abnormal; Notable for the following components:   Color, Urine AMBER (*)    APPearance HAZY (*)    Protein, ur 100 (*)    Leukocytes,Ua MODERATE (*)    Bacteria, UA FEW (*)    Non Squamous Epithelial 0-5 (*)    All other components within normal limits  CULTURE, BLOOD (ROUTINE X 2)  CULTURE, BLOOD (ROUTINE X 2)  URINE CULTURE  C DIFFICILE QUICK SCREEN W PCR REFLEX  NOVEL CORONAVIRUS, NAA (HOSPITAL ORDER, SEND-OUT TO REF LAB)  LIPASE, BLOOD  LACTIC ACID, PLASMA    EKG None  Radiology Dg Chest Port 1 View  Result Date: 07/10/2019 CLINICAL DATA:  right side chest pain EXAM: PORTABLE CHEST 1 VIEW COMPARISON:  07/02/2019 FINDINGS: Cardiomegaly. Lingular atelectasis. Right lung clear. No  effusions or edema. No acute bony abnormality. IMPRESSION: Cardiomegaly. Lingular subsegmental atelectasis. No active disease. Electronically Signed   By: Rolm Baptise M.D.   On: 07/10/2019 21:15    Procedures Procedures (including critical care time)  Medications Ordered in ED Medications  sodium chloride 0.9 % bolus 1,000 mL (1,000 mLs Intravenous Incomplete 07/10/19 2151)  ondansetron (ZOFRAN) injection 4 mg (4 mg Intravenous Refused 07/10/19 2142)     Initial Impression / Assessment and Plan / ED Course  I have reviewed the triage vital signs and the nursing notes.  Pertinent labs & imaging results that were available during my care of the patient were reviewed by me and considered in my medical decision making (see chart for details).        84 yo F with a chief complaints of nausea and diarrhea.  This started this morning.  Patient had 3 watery bowel movements.  She took a Zofran and the nausea improved.  Had some crampy abdominal pain which is improved as well.  Has been able to eat and drink a little bit throughout the day.  When she talked with her daughter this afternoon her daughter felt that she had come back to the hospital to be reevaluated.  Patient was just in the hospital for a complicated UTI.  She has 1 kidney and had  a urinary tract infection and bacteremia.  Patient feels that she is not nearly as ill as she was then.  She has about 4 days left of her oral Keflex.  Will obtain a laboratory evaluation UA reassess.  Workup unremarkable.  Tolerating PO, feeling better post fluids.  11:35 PM:  I have discussed the diagnosis/risks/treatment options with the patient and believe the pt to be eligible for discharge home to follow-up with PCP. We also discussed returning to the ED immediately if new or worsening sx occur. We discussed the sx which are most concerning (e.g., sudden worsening pain, fever, inability to tolerate by mouth) that necessitate immediate return. Medications  administered to the patient during their visit and any new prescriptions provided to the patient are listed below.  Medications given during this visit Medications  sodium chloride 0.9 % bolus 1,000 mL (1,000 mLs Intravenous Incomplete 07/10/19 2151)  ondansetron (ZOFRAN) injection 4 mg (4 mg Intravenous Refused 07/10/19 2142)     The patient appears reasonably screen and/or stabilized for discharge and I doubt any other medical condition or other [facility] requiring further screening, evaluation, or treatment in the ED at this time prior to discharge.    Final Clinical Impressions(s) / ED Diagnoses   Final diagnoses:  Nausea vomiting and diarrhea    ED Discharge Orders         Ordered    ondansetron (ZOFRAN ODT) 4 MG disintegrating tablet  Every 8 hours PRN     07/10/19 2147           Deno Etienne, DO 07/10/19 2335

## 2019-07-10 NOTE — Progress Notes (Signed)
Virtual Visit via Video Note  I connected with Margaret Dyer on 07/10/19 at 11:20 AM EDT by a video enabled telemedicine application and verified that I am speaking with the correct person using two identifiers.  Location: Patient: home  Provider: office    I discussed the limitations of evaluation and management by telemedicine and the availability of in person appointments. The patient expressed understanding and agreed to proceed.  History of Present Illness: Pt is home  with her daughter -- she was d/c from [facility] 7/29-8/2 for urosepsis She was dc on keflex Pt still fills "terrible"  She started with nausea today and thinks it is from the lisinopril she started 2 days ago.  Her bp was low today -- that is not normal for her and her BS was 250----which is very high for her.   No fevers.  Her daughter is concerned because she has been very unsteady on her feet and is a fall risk.  She is unable to leave her and has to stay in the house 24/7.     Observations/Objective: Vitals:   07/10/19 1126  BP: 113/65  Pulse: 70  Temp: 98.8 F (37.1 C)  pt is resting comfortably on her couch. In NAD   Assessment and Plan: 1. Urinary tract infection without hematuria, site unspecified Pt is on keflex --  We need to recheck urine and culture due to pt feeling nauseous and being unsteady on his feet  2. At high risk for injury related to fall Pt has pt coming to the house   3. Uncontrolled type 2 diabetes mellitus with hyperglycemia ([place]) Per endo BS running high  - Ambulatory referral to Home Health  4. Essential hypertension Hold lisinopril  bp running low today Will follow with home health--- pt will let endo and cardiology know it was not tolerated '  Follow Up Instructions:    I discussed the assessment and treatment plan with the patient. The patient was provided an opportunity to ask questions and all were answered. The patient agreed with the plan and demonstrated an  understanding of the instructions.   The patient was advised to call back or seek an in-person evaluation if the symptoms worsen or if the condition fails to improve as anticipated.  I provided 25 minutes of non-face-to-face time during this encounter.   Ann Held, DO

## 2019-07-10 NOTE — Assessment & Plan Note (Signed)
Running low today Hold lisinopril -- pt thinks it is what is making her sick She will call cardiology and endo and let them know she is holding it

## 2019-07-10 NOTE — Discharge Instructions (Signed)
Return for worsening abdominal pain, fever, or inability to eat or drink.

## 2019-07-11 DIAGNOSIS — N390 Urinary tract infection, site not specified: Secondary | ICD-10-CM | POA: Diagnosis not present

## 2019-07-11 DIAGNOSIS — I130 Hypertensive heart and chronic kidney disease with heart failure and stage 1 through stage 4 chronic kidney disease, or unspecified chronic kidney disease: Secondary | ICD-10-CM | POA: Diagnosis not present

## 2019-07-11 DIAGNOSIS — B9689 Other specified bacterial agents as the cause of diseases classified elsewhere: Secondary | ICD-10-CM | POA: Diagnosis not present

## 2019-07-11 DIAGNOSIS — A059 Bacterial foodborne intoxication, unspecified: Secondary | ICD-10-CM | POA: Diagnosis not present

## 2019-07-11 DIAGNOSIS — N200 Calculus of kidney: Secondary | ICD-10-CM | POA: Diagnosis not present

## 2019-07-11 DIAGNOSIS — I5032 Chronic diastolic (congestive) heart failure: Secondary | ICD-10-CM | POA: Diagnosis not present

## 2019-07-11 LAB — URINE CULTURE: Culture: <10000 — AB

## 2019-07-11 NOTE — Telephone Encounter (Signed)
FYI

## 2019-07-11 NOTE — Telephone Encounter (Signed)
Pt d/c home--   Please check on her today

## 2019-07-13 LAB — NOVEL CORONAVIRUS, NAA (HOSP ORDER, SEND-OUT TO REF LAB; TAT 18-24 HRS): SARS-CoV-2, NAA: NOT DETECTED

## 2019-07-14 DIAGNOSIS — N390 Urinary tract infection, site not specified: Secondary | ICD-10-CM | POA: Diagnosis not present

## 2019-07-14 DIAGNOSIS — I130 Hypertensive heart and chronic kidney disease with heart failure and stage 1 through stage 4 chronic kidney disease, or unspecified chronic kidney disease: Secondary | ICD-10-CM | POA: Diagnosis not present

## 2019-07-14 DIAGNOSIS — A059 Bacterial foodborne intoxication, unspecified: Secondary | ICD-10-CM | POA: Diagnosis not present

## 2019-07-14 DIAGNOSIS — I5032 Chronic diastolic (congestive) heart failure: Secondary | ICD-10-CM | POA: Diagnosis not present

## 2019-07-14 DIAGNOSIS — N200 Calculus of kidney: Secondary | ICD-10-CM | POA: Diagnosis not present

## 2019-07-14 DIAGNOSIS — B9689 Other specified bacterial agents as the cause of diseases classified elsewhere: Secondary | ICD-10-CM | POA: Diagnosis not present

## 2019-07-14 NOTE — Telephone Encounter (Signed)
Pt states doing better and has started PT again. Pt states not having nausea, vomiting, or diarrhea. Pt states have blood work done at the hospital.

## 2019-07-15 LAB — CULTURE, BLOOD (ROUTINE X 2)
Culture: NO GROWTH
Culture: NO GROWTH
Special Requests: ADEQUATE
Special Requests: ADEQUATE

## 2019-07-15 NOTE — Telephone Encounter (Signed)
So glad she is feeling better

## 2019-07-17 DIAGNOSIS — A059 Bacterial foodborne intoxication, unspecified: Secondary | ICD-10-CM | POA: Diagnosis not present

## 2019-07-17 DIAGNOSIS — I5032 Chronic diastolic (congestive) heart failure: Secondary | ICD-10-CM | POA: Diagnosis not present

## 2019-07-17 DIAGNOSIS — B9689 Other specified bacterial agents as the cause of diseases classified elsewhere: Secondary | ICD-10-CM | POA: Diagnosis not present

## 2019-07-17 DIAGNOSIS — N390 Urinary tract infection, site not specified: Secondary | ICD-10-CM | POA: Diagnosis not present

## 2019-07-17 DIAGNOSIS — I130 Hypertensive heart and chronic kidney disease with heart failure and stage 1 through stage 4 chronic kidney disease, or unspecified chronic kidney disease: Secondary | ICD-10-CM | POA: Diagnosis not present

## 2019-07-17 DIAGNOSIS — N200 Calculus of kidney: Secondary | ICD-10-CM | POA: Diagnosis not present

## 2019-07-21 ENCOUNTER — Telehealth: Payer: Self-pay | Admitting: *Deleted

## 2019-07-21 ENCOUNTER — Encounter: Payer: Self-pay | Admitting: Family Medicine

## 2019-07-21 ENCOUNTER — Other Ambulatory Visit (INDEPENDENT_AMBULATORY_CARE_PROVIDER_SITE_OTHER): Payer: Medicare Other

## 2019-07-21 ENCOUNTER — Ambulatory Visit (INDEPENDENT_AMBULATORY_CARE_PROVIDER_SITE_OTHER): Payer: Medicare Other | Admitting: Family Medicine

## 2019-07-21 ENCOUNTER — Other Ambulatory Visit: Payer: Self-pay

## 2019-07-21 DIAGNOSIS — R300 Dysuria: Secondary | ICD-10-CM | POA: Diagnosis not present

## 2019-07-21 DIAGNOSIS — R319 Hematuria, unspecified: Secondary | ICD-10-CM

## 2019-07-21 LAB — URINALYSIS, ROUTINE W REFLEX MICROSCOPIC
Bilirubin Urine: NEGATIVE
Ketones, ur: NEGATIVE
Nitrite: NEGATIVE
Specific Gravity, Urine: 1.015 (ref 1.000–1.030)
Urine Glucose: NEGATIVE
Urobilinogen, UA: 0.2 (ref 0.0–1.0)
pH: 7 (ref 5.0–8.0)

## 2019-07-21 MED ORDER — CIPROFLOXACIN HCL 250 MG PO TABS: 250.0000 mg | ORAL_TABLET | Freq: Two times a day (BID) | ORAL | 0 refills | Status: DC

## 2019-07-21 NOTE — Telephone Encounter (Signed)
Copied from [place] [id number]. Topic: General - Inquiry >> Jul 21, 2019 10:31 AM Scherrie Gerlach wrote: Reason for CRM: pt just got out of hospital on 8/06 with UTT.  Pt states she thinks she is getting another. Wants to know if Dr Sherrine Maples will order labs and she will go to [facility] if OK. Pt does not want to wait to long because that what she did last time.  Unable to reach the office, NA.

## 2019-07-21 NOTE — Progress Notes (Signed)
Virtual Visit via Video Note  I connected with Margaret Dyer on 07/21/19 at  4:15 PM EDT by a video enabled telemedicine application and verified that I am speaking with the correct person using two identifiers.  Location: Patient: home Provider: office   I discussed the limitations of evaluation and management by telemedicine and the availability of in person appointments. The patient expressed understanding and agreed to proceed.  History of Present Illness: Pt is in her car and is c/o dysuria and frequency x few days.  No fevers  No abd pain  -- we had to transition to a tele visit because the pt did not get home in time to use her computer.     Observations/Objective: No vitals obtained , no fevers  Pt is in nad   Assessment and Plan: 1. Dysuria Urine dip and culture pending  - ciprofloxacin (CIPRO) 250 MG tablet; Take 1 tablet (250 mg total) by mouth 2 (two) times daily.  Dispense: 10 tablet; Refill: 0   Follow Up Instructions:    I discussed the assessment and treatment plan with the patient. The patient was provided an opportunity to ask questions and all were answered. The patient agreed with the plan and demonstrated an understanding of the instructions.   The patient was advised to call back or seek an in-person evaluation if the symptoms worsen or if the condition fails to improve as anticipated.  I provided 15 minutes of non-face-to-face time during this encounter.   Ann Held, DO

## 2019-07-22 ENCOUNTER — Telehealth: Payer: Self-pay | Admitting: Internal Medicine

## 2019-07-22 DIAGNOSIS — I130 Hypertensive heart and chronic kidney disease with heart failure and stage 1 through stage 4 chronic kidney disease, or unspecified chronic kidney disease: Secondary | ICD-10-CM | POA: Diagnosis not present

## 2019-07-22 DIAGNOSIS — N200 Calculus of kidney: Secondary | ICD-10-CM | POA: Diagnosis not present

## 2019-07-22 DIAGNOSIS — A059 Bacterial foodborne intoxication, unspecified: Secondary | ICD-10-CM | POA: Diagnosis not present

## 2019-07-22 DIAGNOSIS — I5032 Chronic diastolic (congestive) heart failure: Secondary | ICD-10-CM | POA: Diagnosis not present

## 2019-07-22 DIAGNOSIS — N390 Urinary tract infection, site not specified: Secondary | ICD-10-CM | POA: Diagnosis not present

## 2019-07-22 DIAGNOSIS — B9689 Other specified bacterial agents as the cause of diseases classified elsewhere: Secondary | ICD-10-CM | POA: Diagnosis not present

## 2019-07-22 NOTE — Telephone Encounter (Signed)
New Message     Pt c/o medication issue:  1. Name of Medication: Lisinopril   2. How are you currently taking this medication (dosage and times per day)? Not currently taking   3. Are you having a reaction (difficulty breathing--STAT)? No   4. What is your medication issue? Pt took the medication for 2 days and was sick and stopped taking it. She says if she decides to take the medication again, she will come in for labs

## 2019-07-22 NOTE — Telephone Encounter (Signed)
Patient had virtual appt yesterday

## 2019-07-23 LAB — URINE CULTURE
MICRO NUMBER:: 778847
SPECIMEN QUALITY:: ADEQUATE

## 2019-07-24 ENCOUNTER — Telehealth: Payer: Self-pay

## 2019-07-24 DIAGNOSIS — N390 Urinary tract infection, site not specified: Secondary | ICD-10-CM | POA: Diagnosis not present

## 2019-07-24 DIAGNOSIS — I5032 Chronic diastolic (congestive) heart failure: Secondary | ICD-10-CM | POA: Diagnosis not present

## 2019-07-24 DIAGNOSIS — N200 Calculus of kidney: Secondary | ICD-10-CM | POA: Diagnosis not present

## 2019-07-24 DIAGNOSIS — A059 Bacterial foodborne intoxication, unspecified: Secondary | ICD-10-CM | POA: Diagnosis not present

## 2019-07-24 DIAGNOSIS — I130 Hypertensive heart and chronic kidney disease with heart failure and stage 1 through stage 4 chronic kidney disease, or unspecified chronic kidney disease: Secondary | ICD-10-CM | POA: Diagnosis not present

## 2019-07-24 DIAGNOSIS — B9689 Other specified bacterial agents as the cause of diseases classified elsewhere: Secondary | ICD-10-CM | POA: Diagnosis not present

## 2019-07-24 NOTE — Telephone Encounter (Signed)
Copied from [place] [id number]. Topic: Quick Communication - Lab Results (Clinic Use ONLY) >> Jul 23, 2019  3:16 PM Scherrie Gerlach wrote: Pt would like a call back when her UA culture results get in. Pt states she is feeling better on the cipro.

## 2019-07-24 NOTE — Telephone Encounter (Signed)
Left message for pt to call

## 2019-07-24 NOTE — Telephone Encounter (Signed)
Follow up    Pt is calling to leave her BP readings  08/11 174/88 08/12 159/67 08/13 199/79 08/14 160/67 08/15 165/75 08/16 170/66

## 2019-07-25 NOTE — Telephone Encounter (Signed)
Left message for pt to call

## 2019-07-25 NOTE — Telephone Encounter (Signed)
Spoke with pt, aware of dr hilty's recommendations. She has trouble getting to the lab and is going to try the lisinopril again to see if she can tolerate. She will let us know how her blood pressure is doing and if she is able to tolerate.

## 2019-07-25 NOTE — Telephone Encounter (Signed)
BP elevated - was started on ACE-I, but has 1 kidney, creatinine was up on 8/6. Just treated for UTI. Will need repeat BMET before deciding what to do with meds.  Dr. Lemmie Evens

## 2019-07-29 DIAGNOSIS — I130 Hypertensive heart and chronic kidney disease with heart failure and stage 1 through stage 4 chronic kidney disease, or unspecified chronic kidney disease: Secondary | ICD-10-CM | POA: Diagnosis not present

## 2019-07-29 DIAGNOSIS — N200 Calculus of kidney: Secondary | ICD-10-CM | POA: Diagnosis not present

## 2019-07-29 DIAGNOSIS — N390 Urinary tract infection, site not specified: Secondary | ICD-10-CM | POA: Diagnosis not present

## 2019-07-29 DIAGNOSIS — A059 Bacterial foodborne intoxication, unspecified: Secondary | ICD-10-CM | POA: Diagnosis not present

## 2019-07-29 DIAGNOSIS — B9689 Other specified bacterial agents as the cause of diseases classified elsewhere: Secondary | ICD-10-CM | POA: Diagnosis not present

## 2019-07-29 DIAGNOSIS — I5032 Chronic diastolic (congestive) heart failure: Secondary | ICD-10-CM | POA: Diagnosis not present

## 2019-07-30 ENCOUNTER — Telehealth: Payer: Self-pay | Admitting: Internal Medicine

## 2019-07-30 NOTE — Telephone Encounter (Signed)
Spoke with pt who report she resumed lisinopril for 3 days and has developed a terrible cough that is causing her to stay up at night. Pt requesting to stop medication and would like to be prescribed something different.

## 2019-07-30 NOTE — Telephone Encounter (Signed)
° ° ° °  Pt c/o medication issue:  1. Name of Medication: Lisinopril 2. How are you currently taking this medication (dosage and times per day)? n/a  3. Are you having a reaction (difficulty breathing--STAT)? cough  4. What is your medication issue? Patient states she will not continue talking due to cough

## 2019-07-31 MED ORDER — LOSARTAN POTASSIUM 25 MG PO TABS: 12.5000 mg | ORAL_TABLET | Freq: Every day | ORAL | 11 refills | Status: DC

## 2019-07-31 MED ORDER — SPIRONOLACTONE 25 MG PO TABS: 12.5000 mg | ORAL_TABLET | Freq: Every day | ORAL | 11 refills | Status: DC

## 2019-07-31 NOTE — Telephone Encounter (Signed)
Pt updated and voiced understanding. New orders sent to pharmacy.

## 2019-07-31 NOTE — Telephone Encounter (Signed)
Ok to switch lisinopril 5 mg daily to losartan 12.5 mg daily (1/2 of 25 mg tab).  DR. Lemmie Evens

## 2019-08-04 ENCOUNTER — Other Ambulatory Visit: Payer: Self-pay | Admitting: Internal Medicine

## 2019-08-05 DIAGNOSIS — N390 Urinary tract infection, site not specified: Secondary | ICD-10-CM | POA: Diagnosis not present

## 2019-08-05 DIAGNOSIS — B9689 Other specified bacterial agents as the cause of diseases classified elsewhere: Secondary | ICD-10-CM | POA: Diagnosis not present

## 2019-08-05 DIAGNOSIS — I130 Hypertensive heart and chronic kidney disease with heart failure and stage 1 through stage 4 chronic kidney disease, or unspecified chronic kidney disease: Secondary | ICD-10-CM | POA: Diagnosis not present

## 2019-08-05 DIAGNOSIS — N200 Calculus of kidney: Secondary | ICD-10-CM | POA: Diagnosis not present

## 2019-08-05 DIAGNOSIS — I5032 Chronic diastolic (congestive) heart failure: Secondary | ICD-10-CM | POA: Diagnosis not present

## 2019-08-05 DIAGNOSIS — A059 Bacterial foodborne intoxication, unspecified: Secondary | ICD-10-CM | POA: Diagnosis not present

## 2019-08-05 MED ORDER — POTASSIUM CHLORIDE CRYS ER 20 MEQ PO TBCR: 20.0000 meq | EXTENDED_RELEASE_TABLET | Freq: Every day | ORAL | 11 refills | Status: DC

## 2019-08-05 NOTE — Telephone Encounter (Signed)
Patient wants to make sure refill request gets sent to [place], [place]

## 2019-08-06 ENCOUNTER — Telehealth: Payer: Self-pay | Admitting: Internal Medicine

## 2019-08-06 DIAGNOSIS — N183 Chronic kidney disease, stage 3 unspecified: Secondary | ICD-10-CM

## 2019-08-06 DIAGNOSIS — Z79899 Other long term (current) drug therapy: Secondary | ICD-10-CM

## 2019-08-06 NOTE — Telephone Encounter (Signed)
Pt c/o BP issue: STAT if pt c/o blurred vision, one-sided weakness or slurred speech  1. What are your last 5 BP readings? 195/85, the past two night       205/95 one night      135/61 this morning  2. Are you having any other symptoms (ex. Dizziness, headache, blurred vision, passed out)? no   3. What is your BP issue? The night is the problem for her BP, she wants to know if she should take a whole pill instead of 1/2 a pill of the losartan

## 2019-08-06 NOTE — Telephone Encounter (Signed)
Pt says the last 3 nights her BP about 8 pm has been high... 192/86, 195/85 and last night 205/95.... she has had a headache each time... her BP this morning was 135/61 which is what it has been each day during the day.. she eats dinner and takes her Losartan 12.5 mg at 5 pm.   She is asking to increase her Losartan dose.. I will forward to Dr.  Debara Pickett for advice.

## 2019-08-07 DIAGNOSIS — K7581 Nonalcoholic steatohepatitis (NASH): Secondary | ICD-10-CM | POA: Diagnosis not present

## 2019-08-07 DIAGNOSIS — I700 Atherosclerosis of aorta: Secondary | ICD-10-CM | POA: Diagnosis not present

## 2019-08-07 DIAGNOSIS — B9689 Other specified bacterial agents as the cause of diseases classified elsewhere: Secondary | ICD-10-CM | POA: Diagnosis not present

## 2019-08-07 DIAGNOSIS — I2510 Atherosclerotic heart disease of native coronary artery without angina pectoris: Secondary | ICD-10-CM | POA: Diagnosis not present

## 2019-08-07 DIAGNOSIS — I272 Pulmonary hypertension, unspecified: Secondary | ICD-10-CM | POA: Diagnosis not present

## 2019-08-07 DIAGNOSIS — E039 Hypothyroidism, unspecified: Secondary | ICD-10-CM | POA: Diagnosis not present

## 2019-08-07 DIAGNOSIS — I50810 Right heart failure, unspecified: Secondary | ICD-10-CM | POA: Diagnosis not present

## 2019-08-07 DIAGNOSIS — N183 Chronic kidney disease, stage 3 (moderate): Secondary | ICD-10-CM | POA: Diagnosis not present

## 2019-08-07 DIAGNOSIS — D696 Thrombocytopenia, unspecified: Secondary | ICD-10-CM | POA: Diagnosis not present

## 2019-08-07 DIAGNOSIS — E1142 Type 2 diabetes mellitus with diabetic polyneuropathy: Secondary | ICD-10-CM | POA: Diagnosis not present

## 2019-08-07 DIAGNOSIS — E559 Vitamin D deficiency, unspecified: Secondary | ICD-10-CM | POA: Diagnosis not present

## 2019-08-07 DIAGNOSIS — N390 Urinary tract infection, site not specified: Secondary | ICD-10-CM | POA: Diagnosis not present

## 2019-08-07 DIAGNOSIS — M1030 Gout due to renal impairment, unspecified site: Secondary | ICD-10-CM | POA: Diagnosis not present

## 2019-08-07 DIAGNOSIS — N200 Calculus of kidney: Secondary | ICD-10-CM | POA: Diagnosis not present

## 2019-08-07 DIAGNOSIS — E278 Other specified disorders of adrenal gland: Secondary | ICD-10-CM | POA: Diagnosis not present

## 2019-08-07 DIAGNOSIS — I130 Hypertensive heart and chronic kidney disease with heart failure and stage 1 through stage 4 chronic kidney disease, or unspecified chronic kidney disease: Secondary | ICD-10-CM | POA: Diagnosis not present

## 2019-08-07 DIAGNOSIS — R161 Splenomegaly, not elsewhere classified: Secondary | ICD-10-CM | POA: Diagnosis not present

## 2019-08-07 DIAGNOSIS — E1151 Type 2 diabetes mellitus with diabetic peripheral angiopathy without gangrene: Secondary | ICD-10-CM | POA: Diagnosis not present

## 2019-08-07 DIAGNOSIS — I2584 Coronary atherosclerosis due to calcified coronary lesion: Secondary | ICD-10-CM | POA: Diagnosis not present

## 2019-08-07 DIAGNOSIS — K744 Secondary biliary cirrhosis: Secondary | ICD-10-CM | POA: Diagnosis not present

## 2019-08-07 DIAGNOSIS — J9611 Chronic respiratory failure with hypoxia: Secondary | ICD-10-CM | POA: Diagnosis not present

## 2019-08-07 DIAGNOSIS — I5032 Chronic diastolic (congestive) heart failure: Secondary | ICD-10-CM | POA: Diagnosis not present

## 2019-08-07 DIAGNOSIS — A059 Bacterial foodborne intoxication, unspecified: Secondary | ICD-10-CM | POA: Diagnosis not present

## 2019-08-07 DIAGNOSIS — E1122 Type 2 diabetes mellitus with diabetic chronic kidney disease: Secondary | ICD-10-CM | POA: Diagnosis not present

## 2019-08-07 DIAGNOSIS — J45909 Unspecified asthma, uncomplicated: Secondary | ICD-10-CM | POA: Diagnosis not present

## 2019-08-07 MED ORDER — LOSARTAN POTASSIUM 25 MG PO TABS: 25.0000 mg | ORAL_TABLET | Freq: Every day | ORAL | 11 refills | Status: DC

## 2019-08-07 NOTE — Telephone Encounter (Signed)
Patient called with MD recommendations. She will increase losartan as suggested. Rx(s) sent to pharmacy electronically.  BMET ordered. She will contact PCP to see if they can do the lab work next week.

## 2019-08-07 NOTE — Telephone Encounter (Signed)
Could increase losartan to 25 mg (full tablet) daily. Will need repeat BMET in 1 week, since 8/6 BMET at [facility] showed an increased creatinine.  Dr. Lemmie Evens

## 2019-08-12 DIAGNOSIS — B9689 Other specified bacterial agents as the cause of diseases classified elsewhere: Secondary | ICD-10-CM | POA: Diagnosis not present

## 2019-08-12 DIAGNOSIS — A059 Bacterial foodborne intoxication, unspecified: Secondary | ICD-10-CM | POA: Diagnosis not present

## 2019-08-12 DIAGNOSIS — I5032 Chronic diastolic (congestive) heart failure: Secondary | ICD-10-CM | POA: Diagnosis not present

## 2019-08-12 DIAGNOSIS — I130 Hypertensive heart and chronic kidney disease with heart failure and stage 1 through stage 4 chronic kidney disease, or unspecified chronic kidney disease: Secondary | ICD-10-CM | POA: Diagnosis not present

## 2019-08-12 DIAGNOSIS — N200 Calculus of kidney: Secondary | ICD-10-CM | POA: Diagnosis not present

## 2019-08-12 DIAGNOSIS — N390 Urinary tract infection, site not specified: Secondary | ICD-10-CM | POA: Diagnosis not present

## 2019-08-14 ENCOUNTER — Other Ambulatory Visit: Payer: Self-pay | Admitting: Internal Medicine

## 2019-08-14 MED ORDER — FUROSEMIDE 40 MG PO TABS: 40.0000 mg | ORAL_TABLET | Freq: Every day | ORAL | 0 refills | Status: DC

## 2019-08-14 NOTE — Telephone Encounter (Signed)
New Message   *STAT* If patient is at the pharmacy, call can be transferred to refill team.   1. Which medications need to be refilled? (please list name of each medication and dose if known) furosemide (LASIX) 40 MG tablet  2. Which pharmacy/location (including street and city if local pharmacy) is medication to be sent to? [facility] [place].  3. Do they need a 30 day or 90 day supply? 90 day supple

## 2019-08-14 NOTE — Telephone Encounter (Signed)
Requested Prescriptions   Signed Prescriptions Disp Refills  . furosemide (LASIX) 40 MG tablet 90 tablet 0    Sig: Take 1 tablet (40 mg total) by mouth daily.    Authorizing Provider: Pixie Casino    Ordering User: Raelene Bott, BRANDY L

## 2019-08-21 DIAGNOSIS — B9689 Other specified bacterial agents as the cause of diseases classified elsewhere: Secondary | ICD-10-CM | POA: Diagnosis not present

## 2019-08-21 DIAGNOSIS — I5032 Chronic diastolic (congestive) heart failure: Secondary | ICD-10-CM | POA: Diagnosis not present

## 2019-08-21 DIAGNOSIS — A059 Bacterial foodborne intoxication, unspecified: Secondary | ICD-10-CM | POA: Diagnosis not present

## 2019-08-21 DIAGNOSIS — N200 Calculus of kidney: Secondary | ICD-10-CM | POA: Diagnosis not present

## 2019-08-21 DIAGNOSIS — I130 Hypertensive heart and chronic kidney disease with heart failure and stage 1 through stage 4 chronic kidney disease, or unspecified chronic kidney disease: Secondary | ICD-10-CM | POA: Diagnosis not present

## 2019-08-21 DIAGNOSIS — N390 Urinary tract infection, site not specified: Secondary | ICD-10-CM | POA: Diagnosis not present

## 2019-09-08 ENCOUNTER — Telehealth: Payer: Self-pay | Admitting: Family Medicine

## 2019-09-08 MED ORDER — METOPROLOL TARTRATE 25 MG PO TABS: 25.0000 mg | ORAL_TABLET | Freq: Two times a day (BID) | ORAL | 1 refills | Status: DC

## 2019-09-08 NOTE — Telephone Encounter (Signed)
Refill sent

## 2019-09-08 NOTE — Telephone Encounter (Signed)
Medication Refill - Medication: metoprolol tartrate (LOPRESSOR) 25 MG tablet  For 180 tablets   Has the patient contacted their pharmacy? Yes.   (Agent: If no, request that the patient contact the pharmacy for the refill.) (Agent: If yes, when and what did the pharmacy advise?)  Preferred Pharmacy (with phone number or street name):  [facility] - [place], [place]. [phone] (Phone) [phone] (Fax)     Agent: Please be advised that RX refills may take up to 3 business days. We ask that you follow-up with your pharmacy.

## 2019-09-29 ENCOUNTER — Telehealth: Payer: Self-pay | Admitting: Family Medicine

## 2019-09-29 DIAGNOSIS — R32 Unspecified urinary incontinence: Secondary | ICD-10-CM

## 2019-09-29 MED ORDER — OXYBUTYNIN CHLORIDE 5 MG PO TABS: 5.0000 mg | ORAL_TABLET | Freq: Every day | ORAL | 1 refills | Status: DC

## 2019-09-29 NOTE — Telephone Encounter (Signed)
Medication Refill - Medication: oxybutynin (DITROPAN) 5 MG tablet    Has the patient contacted their pharmacy? No. (Agent: If no, request that the patient contact the pharmacy for the refill.) (Agent: If yes, when and what did the pharmacy advise?)  Preferred Pharmacy (with phone number or street name):  [facility] - [place] - [address]  [address] [place] [address]  Phone: [phone] Fax: [phone]  Not a 24 hour pharmacy; exact hours not known.     Agent: Please be advised that RX refills may take up to 3 business days. We ask that you follow-up with your pharmacy.

## 2019-09-29 NOTE — Telephone Encounter (Signed)
Routed incorrectly

## 2019-10-03 NOTE — Telephone Encounter (Signed)
Pt called to f/up on this request.

## 2019-10-06 ENCOUNTER — Other Ambulatory Visit: Payer: Self-pay

## 2019-10-06 DIAGNOSIS — R32 Unspecified urinary incontinence: Secondary | ICD-10-CM

## 2019-10-06 MED ORDER — OXYBUTYNIN CHLORIDE 5 MG PO TABS: 5.0000 mg | ORAL_TABLET | Freq: Every day | ORAL | 1 refills | Status: DC

## 2019-10-06 NOTE — Telephone Encounter (Signed)
Med was refilled on 10/26 but was sent to wrong pharmacy. Med refilled today

## 2019-10-13 ENCOUNTER — Other Ambulatory Visit: Payer: Self-pay | Admitting: Family Medicine

## 2019-10-13 DIAGNOSIS — G894 Chronic pain syndrome: Secondary | ICD-10-CM

## 2019-10-13 DIAGNOSIS — M79605 Pain in left leg: Secondary | ICD-10-CM

## 2019-10-13 NOTE — Telephone Encounter (Signed)
Medication Refill - Medication: traMADol (ULTRAM) 50 MG tablet   Has the patient contacted their pharmacy? No. (Agent: If no, request that the patient contact the pharmacy for the refill.) (Agent: If yes, when and what did the pharmacy advise?)  Preferred Pharmacy (with phone number or street name):  [facility] - [place], [place]. [phone] (Phone) [phone] (Fax)     Agent: Please be advised that RX refills may take up to 3 business days. We ask that you follow-up with your pharmacy.

## 2019-10-14 MED ORDER — TRAMADOL HCL 50 MG PO TABS: 50.0000 mg | ORAL_TABLET | Freq: Four times a day (QID) | ORAL | 0 refills | Status: DC | PRN

## 2019-10-14 NOTE — Telephone Encounter (Signed)
Last Tramadol RX: 06/13/19, #60 Last OV: 08/12/19 Next OV: none scheduled UDS: 08/01/18, past due CSC: 08/01/18, past due

## 2019-10-31 DIAGNOSIS — Z23 Encounter for immunization: Secondary | ICD-10-CM | POA: Diagnosis not present

## 2019-11-06 ENCOUNTER — Other Ambulatory Visit: Payer: Self-pay | Admitting: Internal Medicine

## 2019-11-10 ENCOUNTER — Telehealth: Payer: Self-pay

## 2019-11-10 NOTE — Telephone Encounter (Signed)
Updated in chart.

## 2019-11-10 NOTE — Telephone Encounter (Signed)
Copied from [place] [id number]. Topic: General - Other >> Nov 10, 2019  2:43 PM Rainey Pines A wrote: Patient  was advised to call to inform Dr. Etter Sjogren  that she got the high dose flu shot on 10/31/2019 at [facility] .

## 2019-11-19 ENCOUNTER — Other Ambulatory Visit: Payer: Self-pay | Admitting: Internal Medicine

## 2019-11-19 NOTE — Telephone Encounter (Signed)
*  STAT* If patient is at the pharmacy, call can be transferred to refill team.   1. Which medications need to be refilled? (please list name of each medication and dose if known) metoprolol tartrate (LOPRESSOR) 25 MG tablet  2. Which pharmacy/location (including street and city if local pharmacy) is medication to be sent to? [facility] - [place] - [place].  3. Do they need a 30 day or 90 day supply? 90   Patient has one tablet left. This medication is prescribed by her PCP

## 2019-11-20 MED ORDER — METOPROLOL TARTRATE 25 MG PO TABS: 25.0000 mg | ORAL_TABLET | Freq: Two times a day (BID) | ORAL | 2 refills | Status: DC

## 2019-11-20 NOTE — Telephone Encounter (Signed)
Rx(s) sent to pharmacy electronically.

## 2019-12-02 ENCOUNTER — Encounter: Payer: Self-pay | Admitting: Family Medicine

## 2019-12-02 ENCOUNTER — Ambulatory Visit (INDEPENDENT_AMBULATORY_CARE_PROVIDER_SITE_OTHER): Payer: Medicare Other | Admitting: Family Medicine

## 2019-12-02 ENCOUNTER — Other Ambulatory Visit: Payer: Self-pay

## 2019-12-02 VITALS — BP 156/83 | HR 64 | Ht 63.5 in | Wt 193.0 lb

## 2019-12-02 DIAGNOSIS — K219 Gastro-esophageal reflux disease without esophagitis: Secondary | ICD-10-CM

## 2019-12-02 DIAGNOSIS — J9611 Chronic respiratory failure with hypoxia: Secondary | ICD-10-CM

## 2019-12-02 MED ORDER — SUCRALFATE 1 G PO TABS: 1.0000 g | ORAL_TABLET | Freq: Three times a day (TID) | ORAL | 0 refills | Status: DC

## 2019-12-02 NOTE — Assessment & Plan Note (Signed)
On O2 2 L  Pt needs over night ox  She needs recert for O2

## 2019-12-02 NOTE — Progress Notes (Signed)
Virtual Visit via Video Note  I connected with Margaret Dyer on 12/02/19 at 10:40 AM EST by a video enabled telemedicine application and verified that I am speaking with the correct person using two identifiers.  Location: Patient: home with family  Provider: office    I discussed the limitations of evaluation and management by telemedicine and the availability of in person appointments. The patient expressed understanding and agreed to proceed.  History of Present Illness: Pt is home and needs recertification for O2 She needs a form filled out and she states she has needed the o2 more often lately   [facility] --- [phone] Form 484.02    Observations/Objective: Vitals:   12/02/19 1039  BP: (!) 156/83  Pulse: 64  SpO2: 94%  30 min after taking O2 off  O2 at 2L   Pt is in NAD Assessment and Plan: 1. Gastroesophageal reflux disease, unspecified whether esophagitis present Pt to f/u Dr Cristina Gong  - sucralfate (CARAFATE) 1 g tablet; Take 1 tablet (1 g total) by mouth 4 (four) times daily -  with meals and at bedtime.  Dispense: 90 tablet; Refill: 0  2. Chronic respiratory failure with hypoxia (HCC) Pt needs over night O2 She has increased her O2 use  Will contact [facility] and get info needed to renew O2   Follow Up Instructions:    I discussed the assessment and treatment plan with the patient. The patient was provided an opportunity to ask questions and all were answered. The patient agreed with the plan and demonstrated an understanding of the instructions.   The patient was advised to call back or seek an in-person evaluation if the symptoms worsen or if the condition fails to improve as anticipated.  I provided 15 minutes of non-face-to-face time during this encounter.   Ann Held, DO

## 2019-12-24 ENCOUNTER — Ambulatory Visit: Payer: Medicare Other | Admitting: Family Medicine

## 2020-01-07 ENCOUNTER — Other Ambulatory Visit: Payer: Self-pay | Admitting: Family Medicine

## 2020-01-07 DIAGNOSIS — M79605 Pain in left leg: Secondary | ICD-10-CM

## 2020-01-07 DIAGNOSIS — G894 Chronic pain syndrome: Secondary | ICD-10-CM

## 2020-01-08 NOTE — Telephone Encounter (Signed)
Last Tramadol RX:  10/14/19, # 60 Last OV:  12/02/19 Next OV:   None scheduled UDS:  08/01/18, past due CSC: 08/01/18, past due

## 2020-01-08 NOTE — Telephone Encounter (Signed)
Spoke with pt and notified her that she will need UDS and updated contract before we can give her further refills. Pt voices understanding and states her daughter will have to bring her for the appt and she will need to check her daughter's schedule then call us back to make appt. Pt is aware that future refills may not be given until lab appt / contract has been completed. Future lab order placed. Contract printed and placed at front desk for pt to sign when she comes for lab appointment.

## 2020-01-08 NOTE — Addendum Note (Signed)
Addended by: Kelle Darting A on: 01/08/2020 01:36 PM   Modules accepted: Orders

## 2020-01-30 ENCOUNTER — Telehealth: Payer: Self-pay | Admitting: Family Medicine

## 2020-01-30 NOTE — Telephone Encounter (Signed)
Spoke with patient. Advised to get vaccine as long as she doesn't have allergies that require a Epi-pen

## 2020-01-30 NOTE — Telephone Encounter (Signed)
Pt wants to know with all  Her health conditions if she should get Covid Vaccine. Please Advise

## 2020-02-04 ENCOUNTER — Telehealth: Payer: Self-pay | Admitting: Family Medicine

## 2020-02-04 NOTE — Telephone Encounter (Signed)
Needs re-certification for oxygen equipment, Patient states that Dr Carollee Herter sent the wrong form. Patient is getting equipment from [employer].  Customer Id [id number] Telephone : [phone]

## 2020-02-06 ENCOUNTER — Other Ambulatory Visit: Payer: Self-pay | Admitting: Internal Medicine

## 2020-02-06 NOTE — Telephone Encounter (Signed)
Called [employer]. We missed of the questions on  the 02 form. Form was amended and faxed again.

## 2020-04-05 ENCOUNTER — Other Ambulatory Visit: Payer: Self-pay | Admitting: Family Medicine

## 2020-04-05 ENCOUNTER — Emergency Department (HOSPITAL_COMMUNITY)
Admission: EM | Admit: 2020-04-05 | Discharge: 2020-04-05 | Disposition: A | Discharge status: Home / Self Care | Payer: Medicare Other | Attending: Emergency Medicine | Admitting: Emergency Medicine

## 2020-04-05 ENCOUNTER — Other Ambulatory Visit: Payer: Self-pay

## 2020-04-05 ENCOUNTER — Encounter (HOSPITAL_COMMUNITY): Payer: Self-pay

## 2020-04-05 DIAGNOSIS — Z7982 Long term (current) use of aspirin: Secondary | ICD-10-CM | POA: Insufficient documentation

## 2020-04-05 DIAGNOSIS — E119 Type 2 diabetes mellitus without complications: Secondary | ICD-10-CM | POA: Insufficient documentation

## 2020-04-05 DIAGNOSIS — J45909 Unspecified asthma, uncomplicated: Secondary | ICD-10-CM | POA: Insufficient documentation

## 2020-04-05 DIAGNOSIS — Z7729 Contact with and (suspected ) exposure to other hazardous substances: Secondary | ICD-10-CM

## 2020-04-05 DIAGNOSIS — Z794 Long term (current) use of insulin: Secondary | ICD-10-CM | POA: Diagnosis not present

## 2020-04-05 DIAGNOSIS — N183 Chronic kidney disease, stage 3 unspecified: Secondary | ICD-10-CM | POA: Insufficient documentation

## 2020-04-05 DIAGNOSIS — I5032 Chronic diastolic (congestive) heart failure: Secondary | ICD-10-CM | POA: Insufficient documentation

## 2020-04-05 DIAGNOSIS — Z87891 Personal history of nicotine dependence: Secondary | ICD-10-CM | POA: Diagnosis not present

## 2020-04-05 DIAGNOSIS — Z79899 Other long term (current) drug therapy: Secondary | ICD-10-CM | POA: Diagnosis not present

## 2020-04-05 DIAGNOSIS — E1122 Type 2 diabetes mellitus with diabetic chronic kidney disease: Secondary | ICD-10-CM | POA: Diagnosis not present

## 2020-04-05 DIAGNOSIS — I130 Hypertensive heart and chronic kidney disease with heart failure and stage 1 through stage 4 chronic kidney disease, or unspecified chronic kidney disease: Secondary | ICD-10-CM | POA: Diagnosis not present

## 2020-04-05 DIAGNOSIS — E039 Hypothyroidism, unspecified: Secondary | ICD-10-CM | POA: Diagnosis not present

## 2020-04-05 DIAGNOSIS — R32 Unspecified urinary incontinence: Secondary | ICD-10-CM

## 2020-04-05 DIAGNOSIS — T5891XA Toxic effect of carbon monoxide from unspecified source, accidental (unintentional), initial encounter: Secondary | ICD-10-CM | POA: Diagnosis not present

## 2020-04-05 LAB — COOXEMETRY PANEL
Carboxyhemoglobin: 1.8 % — ABNORMAL HIGH (ref 0.5–1.5)
Methemoglobin: 0.6 % (ref 0.0–1.5)
O2 Saturation: 93.1 %
Total hemoglobin: 14.1 g/dL (ref 12.0–16.0)

## 2020-04-05 NOTE — ED Provider Notes (Signed)
[facility] DEPT Provider Note   CSN: [id number] Arrival date & time: 04/05/20  1736     History Chief Complaint  Patient presents with  . Possible CO2 exposure    Margaret Dyer is a 85 y.o. female.  HPI 85 year old female presents with possible carbon monoxide exposure.  In February she and her daughter had headaches after the generator was on all night and had to be evacuated for elevated carbon monoxide levels.  She has felt fine since then.  She has chronic dyspnea from CHF but it is unchanged.  However the carbon monoxide measurements from the meters her daughter bought were elevated today.  She was referred here for evaluation.  She otherwise feels well.   Past Medical History:  Diagnosis Date  . Asthma   . Blood transfusion   . Chronic diastolic CHF (congestive heart failure) ([place])   . CKD (chronic kidney disease), stage III   . Coronary artery calcification seen on CT scan   . Diabetes mellitus   . Goiter   . Hyperlipidemia   . Hypertension   . Hypothyroidism   . Liver cirrhosis ([place])   . NASH (nonalcoholic steatohepatitis)   . Obesity   . Osteopenia   . Pulmonary hypertension ([place])   . Renal cell carcinoma    a. prior nephrectomy.  . Right heart failure ([place])   . Thrombocytopenia [facility])     Patient Active Problem List   Diagnosis Date Noted  . Closed fracture of maxillary sinus ([place]) 07/10/2019  . UTI (urinary tract infection) 07/03/2019  . Food poisoning 07/03/2019  . Bacteremia 07/03/2019  . Exposure to COVID-19 virus 06/16/2019  . Closed left hip fracture ([place]) 12/24/2018  . Dysuria 12/17/2017  . Dizziness 12/17/2017  . Community acquired pneumonia 12/17/2017  . Pneumonia 12/01/2017  . Urinary incontinence 11/09/2017  . Tic 11/09/2017  . CKD (chronic kidney disease), stage III 09/12/2017  . Pulmonary hypertension ([place]) 09/12/2017  . Chronic diastolic CHF (congestive heart failure) ([place]) 06/05/2017  . Oral candida  05/29/2017  . Vitamin D deficiency 05/29/2017  . Chronic respiratory failure ([place]) 05/24/2017  . Anxiety 05/24/2017  . HCAP (healthcare-associated pneumonia) 05/22/2017  . Right heart failure ([place]) 05/17/2017  . Acute diastolic CHF (congestive heart failure) ([place])   . Exertional dyspnea 05/13/2017  . Hypertension 05/13/2017  . Asthma 05/13/2017  . DOE (dyspnea on exertion) 05/13/2017  . Diabetes mellitus type 2, insulin dependent ([place]) 05/13/2017  . Acute kidney injury ([place]) 05/13/2017  . History of nephrectomy 05/13/2017  . Hyperlipidemia LDL goal <70 05/13/2017  . Liver cirrhosis secondary to NASH ([place]) 05/13/2017  . Thrombocytopenia ([place]) 05/13/2017  . Renal cell carcinoma 05/13/2017  . Hypothyroidism 05/13/2017  . Hypersplenism 05/13/2017  . Osteoarthritis 05/13/2017  . Overactive bladder 05/13/2017  . URI, acute 02/22/2017  . Abrasion, left knee, initial encounter 02/22/2017  . CTS (carpal tunnel syndrome) 03/04/2016  . Bilateral contusion of ribs 10/05/2015  . Dental abscess 04/15/2014  . Claudication, intermittent ([place]) 12/05/2013  . Obesity (BMI 30-39.9) 07/22/2013  . Back pain 05/26/2013  . Platelets decreased ([place]) 05/26/2013  . Breast pain, right 04/21/2013  . Anxiety and depression 12/05/2012  . Bronchitis 11/06/2012  . HEMATURIA UNSPECIFIED 02/17/2011  . Hyperlipidemia 09/08/2010  . B12 DEFICIENCY 07/22/2010  . DIZZINESS 07/04/2010  . Essential hypertension 06/21/2010  . GOUT, UNSPECIFIED 01/27/2010  . NEPHRECTOMY, HX OF 12/02/2009  . NECK PAIN, LEFT 09/04/2008  . PARESTHESIA 09/02/2007  . GOITER NOS  04/23/2007  . Diabetes mellitus ([place]) 04/23/2007  . Asthma 04/23/2007  . Renal stone 04/23/2007  . OSTEOPENIA 04/23/2007  . CARCINOMA, RENAL CELL 08/10/1999    Past Surgical History:  Procedure Laterality Date  . ABDOMINAL HYSTERECTOMY    . APPENDECTOMY    . HEMORRHOID SURGERY    . INTRAMEDULLARY (IM) NAIL INTERTROCHANTERIC Left 12/25/2018   Procedure:  INTRAMEDULLARY (IM) NAIL INTERTROCHANTRIC;  Surgeon: Nicholes Stairs, MD;  Location: [facility];  Service: Orthopedics;  Laterality: Left;  . KNEE SURGERY    . NEPHRECTOMY  09.2000     OB History   No obstetric history on file.     Family History  Problem Relation Age of Onset  . Cancer Sister        bladder  . Kidney cancer Father   . Cancer Father        renal  . COPD Father   . Congestive Heart Failure Father   . Coronary artery disease Other   . Diabetes Other   . Hyperlipidemia Other   . Hypertension Other   . Rheumatologic disease Neg Hx     Social History   Tobacco Use  . Smoking status: Former Research scientist (life sciences)  . Smokeless tobacco: Never Used  . Tobacco comment: 05/30/18 none in 40 years  Substance Use Topics  . Alcohol use: No    Alcohol/week: 0.0 standard drinks  . Drug use: No    Home Medications Prior to Admission medications   Medication Sig Start Date End Date Taking? Authorizing Provider  aspirin EC 81 MG EC tablet Take 1 tablet (81 mg total) by mouth daily. 07/07/19  Yes Eulogio Bear U, DO  docusate sodium (COLACE) 100 MG capsule Take 1 capsule (100 mg total) by mouth 2 (two) times daily. Patient taking differently: Take 100 mg by mouth See admin instructions. Take 1 tablet every day can take a second tablet as needed for constipation 12/30/18  Yes Bonnell Public, MD  ezetimibe-simvastatin (VYTORIN) 10-40 MG tablet Take 1 tablet by mouth daily.   Yes [provider]  fluconazole (DIFLUCAN) 150 MG tablet Take 1 tablet (150 mg total) by mouth once as needed for up to 1 dose (yeast infection). 07/06/19  Yes Vann, Jessica U, DO  furosemide (LASIX) 40 MG tablet TAKE 1 TABLET BY MOUTH DAILY 02/06/20  Yes Hilty, Nadean Corwin, MD  hydrALAZINE (APRESOLINE) 25 MG tablet TAKE 1 TABLET BY MOUTH EVERY 8 HOURS Patient taking differently: Take 25 mg by mouth in the morning and at bedtime.  11/19/19  Yes Hilty, Nadean Corwin, MD  insulin glargine (LANTUS) 100 UNIT/ML injection  Inject 0.25 mLs (25 Units total) into the skin at bedtime. Patient taking differently: Inject 30 Units into the skin at bedtime.  07/06/19  Yes Geradine Girt, DO  Insulin Glulisine (APIDRA SOLOSTAR) 100 UNIT/ML Solostar Pen Inject 10-15 Units into the skin 3 (three) times daily before meals.    Yes [provider]  levothyroxine (SYNTHROID, LEVOTHROID) 25 MCG tablet take 1 tablet by mouth daily Patient taking differently: Take 25 mcg by mouth See admin instructions. Take 25 mcg by mouth before breakfast on Mon/Tues/Wed/Thurs/Fri/Sat 07/13/17  Yes Lowne Lyndal Pulley R, DO  losartan (COZAAR) 25 MG tablet Take 1 tablet (25 mg total) by mouth daily. 08/07/19  Yes Hilty, Nadean Corwin, MD  metoprolol tartrate (LOPRESSOR) 25 MG tablet Take 1 tablet (25 mg total) by mouth 2 (two) times daily. 11/20/19  Yes Hilty, Nadean Corwin, MD  Multiple Vitamins-Minerals (CENTRUM  SILVER ULTRA WOMENS PO) Take 1 tablet by mouth at bedtime.    Yes [provider]  ondansetron (ZOFRAN ODT) 4 MG disintegrating tablet Take 1 tablet (4 mg total) by mouth every 8 (eight) hours as needed for nausea or vomiting. 07/10/19  Yes Deno Etienne, DO  oxybutynin (DITROPAN) 5 MG tablet TAKE 1 TABLET BY MOUTH DAILY 04/05/20  Yes Roma Schanz R, DO  potassium chloride SA (KLOR-CON) 20 MEQ tablet Take 20 mEq by mouth at bedtime.   Yes [provider]  spironolactone (ALDACTONE) 25 MG tablet Take 12.5 mg by mouth daily.   Yes [provider]  sucralfate (CARAFATE) 1 g tablet Take 1 tablet (1 g total) by mouth 4 (four) times daily -  with meals and at bedtime. Patient taking differently: Take 1 g by mouth 3 (three) times daily as needed (indigestion).  12/02/19  Yes Roma Schanz R, DO  traMADol (ULTRAM) 50 MG tablet TAKE 1 TABLET BY MOUTH EVERY 6 HOURS AS NEEDED FOR MODERATE PAIN Patient taking differently: Take 50 mg by mouth every 6 (six) hours as needed for moderate pain.  01/08/20  Yes Roma Schanz R,  DO  ondansetron (ZOFRAN) 4 MG tablet Take 1 tablet (4 mg total) by mouth every 8 (eight) hours as needed for nausea or vomiting. Patient not taking: Reported on 04/05/2020 07/06/19 07/05/20  Geradine Girt, DO  potassium chloride SA (K-DUR) 20 MEQ tablet Take 1 tablet (20 mEq total) by mouth at bedtime. 08/05/19 11/03/19  Ledora Bottcher, PA    Allergies    Other, Cefuroxime axetil, and Ciprofloxacin  Review of Systems   Review of Systems  Respiratory: Positive for shortness of breath (chronic, unchanged).   Neurological: Negative for headaches.  All other systems reviewed and are negative.   Physical Exam Updated Vital Signs BP (!) 146/43   Pulse (!) 50   Temp 98.1 F (36.7 C) (Oral)   Resp 16   Ht 5' 3.5" (1.613 m)   Wt 90.7 kg   LMP  (LMP Unknown)   SpO2 98%   BMI 34.87 kg/m   Physical Exam Vitals and nursing note reviewed.  Constitutional:      Appearance: She is well-developed.  HENT:     Head: Normocephalic and atraumatic.     Right Ear: External ear normal.     Left Ear: External ear normal.     Nose: Nose normal.  Eyes:     General:        Right eye: No discharge.        Left eye: No discharge.     Extraocular Movements: Extraocular movements intact.     Pupils: Pupils are equal, round, and reactive to light.  Cardiovascular:     Rate and Rhythm: Normal rate and regular rhythm.     Heart sounds: Normal heart sounds.  Pulmonary:     Effort: Pulmonary effort is normal.     Breath sounds: Normal breath sounds.  Abdominal:     Palpations: Abdomen is soft.     Tenderness: There is no abdominal tenderness.  Skin:    General: Skin is warm and dry.  Neurological:     Mental Status: She is alert and oriented to person, place, and time.     Comments: CN 3-12 grossly intact. 5/5 strength in all 4 extremities. Grossly normal sensation. Normal finger to nose.   Psychiatric:        Mood and Affect: Mood is not  anxious.     ED Results / Procedures / Treatments     Labs (all labs ordered are listed, but only abnormal results are displayed) Labs Reviewed  COOXEMETRY PANEL - Abnormal; Notable for the following components:      Result Value   Carboxyhemoglobin 1.8 (*)    All other components within normal limits    EKG None  Radiology No results found.  Procedures Procedures (including critical care time)  Medications Ordered in ED Medications - No data to display  ED Course  I have reviewed the triage vital signs and the nursing notes.  Pertinent labs & imaging results that were available during my care of the patient were reviewed by me and considered in my medical decision making (see chart for details).    MDM Rules/Calculators/A&P                      Patient's cooximetry panel was reviewed and is pretty unremarkable.  Not consistent with carbon monoxide poisoning.  She is otherwise asymptomatic.  Recommend she not go back to this house until it has been cleared of any residual carbon monoxide. Final Clinical Impression(s) / ED Diagnoses Final diagnoses:  Carbon monoxide exposure    Rx / DC Orders ED Discharge Orders    None       Sherwood Gambler, MD 04/06/20 [phone]

## 2020-04-05 NOTE — ED Triage Notes (Signed)
Patient has had possible CO2 exposure. This happened in February. Patiaent states, "It is hard for me to tell if my symptoms are worse because I have been experiencing symptoms from the first time it happened." Patient denies any headache and states her breathing may be worse.

## 2020-04-05 NOTE — ED Notes (Signed)
Pt assisted to BR via wheelchair. Pt states she voided on herself and needed to change. Pt offered scrub pants and mesh underwear but pt refused. Pt states she changed her underwear and provided herself with peri care. Pt assisted back to her bed via wheelchair. Pt has call bell within reach.

## 2020-04-05 NOTE — ED Notes (Signed)
Margaret Dyer with Poison Control called [facility] stating pt and her daughter had high levels of C0 in there home, Level reported to [employer] of 29. They have been advised to come to the ED for evaluation.

## 2020-05-05 ENCOUNTER — Other Ambulatory Visit: Payer: Self-pay | Admitting: Internal Medicine

## 2020-06-08 ENCOUNTER — Other Ambulatory Visit: Payer: Self-pay

## 2020-06-08 ENCOUNTER — Telehealth: Payer: Self-pay | Admitting: Family Medicine

## 2020-06-08 DIAGNOSIS — Z1231 Encounter for screening mammogram for malignant neoplasm of breast: Secondary | ICD-10-CM

## 2020-06-08 DIAGNOSIS — Z13820 Encounter for screening for osteoporosis: Secondary | ICD-10-CM

## 2020-06-08 NOTE — Telephone Encounter (Signed)
Per patient she needs a referral to get a mammogram and bone density test . Patient would like to go to :  [facility] [address] [place], [place], [place] [address] [phone]

## 2020-06-08 NOTE — Telephone Encounter (Signed)
Spoke with pt. Dexa scan ordered. Pt is over 82 and will not need a mammogram.

## 2020-06-23 ENCOUNTER — Other Ambulatory Visit: Payer: Self-pay | Admitting: Family Medicine

## 2020-06-23 DIAGNOSIS — M79605 Pain in left leg: Secondary | ICD-10-CM

## 2020-06-23 DIAGNOSIS — G894 Chronic pain syndrome: Secondary | ICD-10-CM

## 2020-06-23 MED ORDER — TRAMADOL HCL 50 MG PO TABS: ORAL_TABLET | ORAL | 0 refills | Status: DC

## 2020-06-23 NOTE — Telephone Encounter (Signed)
Requesting: Tramadol  Contract: 08/01/2018 UDS: 08/01/2018 Last OV: 12/02/2019 Next OV: N/A Last Refill: 01/08/2020, #60--0 RF Database:   Please advise

## 2020-06-23 NOTE — Telephone Encounter (Signed)
Medication: traMADol (ULTRAM) 50 MG tablet [[id number]]      Has the patient contacted their pharmacy?  (If no, request that the patient contact the pharmacy for the refill.) (If yes, when and what did the pharmacy advise?)     Preferred Pharmacy (with phone number or street name): [facility] - [place], [place] [place] [address]  Phone:  [phone] Fax:  [phone]      Agent: Please be advised that RX refills may take up to 3 business days. We ask that you follow-up with your pharmacy.

## 2020-06-24 DIAGNOSIS — R300 Dysuria: Secondary | ICD-10-CM | POA: Diagnosis not present

## 2020-06-24 DIAGNOSIS — N390 Urinary tract infection, site not specified: Secondary | ICD-10-CM | POA: Diagnosis not present

## 2020-07-26 ENCOUNTER — Other Ambulatory Visit: Payer: Self-pay | Admitting: Internal Medicine

## 2020-07-30 DIAGNOSIS — E113493 Type 2 diabetes mellitus with severe nonproliferative diabetic retinopathy without macular edema, bilateral: Secondary | ICD-10-CM | POA: Diagnosis not present

## 2020-08-11 ENCOUNTER — Other Ambulatory Visit: Payer: Self-pay | Admitting: Internal Medicine

## 2020-08-12 ENCOUNTER — Encounter: Payer: Self-pay | Admitting: Family Medicine

## 2020-08-12 DIAGNOSIS — M8589 Other specified disorders of bone density and structure, multiple sites: Secondary | ICD-10-CM | POA: Diagnosis not present

## 2020-08-12 DIAGNOSIS — R921 Mammographic calcification found on diagnostic imaging of breast: Secondary | ICD-10-CM | POA: Diagnosis not present

## 2020-08-12 LAB — HM MAMMOGRAPHY

## 2020-08-25 ENCOUNTER — Telehealth: Payer: Self-pay

## 2020-08-25 ENCOUNTER — Other Ambulatory Visit: Payer: Self-pay | Admitting: Internal Medicine

## 2020-08-25 NOTE — Telephone Encounter (Signed)
Spoke with patient regarding Bone density. Pt requested copies sent to her. Original sent to scan.

## 2020-08-26 NOTE — Telephone Encounter (Signed)
Pt overdue for 12 month f/u. Please contact pt for future appointment. 

## 2020-09-07 ENCOUNTER — Other Ambulatory Visit: Payer: Self-pay | Admitting: Internal Medicine

## 2020-09-07 MED ORDER — POTASSIUM CHLORIDE CRYS ER 20 MEQ PO TBCR: 20.0000 meq | EXTENDED_RELEASE_TABLET | Freq: Every day | ORAL | 2 refills | Status: DC

## 2020-09-07 NOTE — Telephone Encounter (Signed)
*  STAT* If patient is at the pharmacy, call can be transferred to refill team.   1. Which medications need to be refilled? (please list name of each medication and dose if known)  potassium chloride SA (KLOR-CON) 20 MEQ tablet  2. Which pharmacy/location (including street and city if local pharmacy) is medication to be sent to? [facility] - [place], [place] - [place].  3. Do they need a 30 day or 90 day supply? 30 day supply

## 2020-09-13 DIAGNOSIS — Z23 Encounter for immunization: Secondary | ICD-10-CM | POA: Diagnosis not present

## 2020-09-17 ENCOUNTER — Telehealth: Payer: Self-pay | Admitting: Family Medicine

## 2020-09-17 NOTE — Telephone Encounter (Signed)
Patient would like a copy of her mammogram mailed to her.

## 2020-09-20 NOTE — Telephone Encounter (Signed)
Mammogram results received this morning. Pt advised of normal results

## 2020-09-24 ENCOUNTER — Other Ambulatory Visit: Payer: Self-pay

## 2020-09-24 ENCOUNTER — Other Ambulatory Visit: Payer: Self-pay | Admitting: *Deleted

## 2020-09-24 MED ORDER — SPIRONOLACTONE 25 MG PO TABS: ORAL_TABLET | ORAL | 1 refills | Status: DC

## 2020-09-24 MED ORDER — SPIRONOLACTONE 25 MG PO TABS: ORAL_TABLET | ORAL | 0 refills | Status: DC

## 2020-09-24 NOTE — Telephone Encounter (Signed)
Rx has been sent to the pharmacy electronically.

## 2020-09-30 ENCOUNTER — Telehealth: Payer: Self-pay | Admitting: Family Medicine

## 2020-09-30 DIAGNOSIS — Z20828 Contact with and (suspected) exposure to other viral communicable diseases: Secondary | ICD-10-CM | POA: Diagnosis not present

## 2020-09-30 NOTE — Telephone Encounter (Signed)
Patient states she needs to speak to someone in the patient assistant dept regards to Apidra & Lantus

## 2020-10-01 NOTE — Telephone Encounter (Signed)
Called patient, she was unaware she called the wrong office. She thought she had called endo when she had in fact called her PCP office. Patients endo manages her medications for diabetes. Patient states she will call them to refill.

## 2020-10-01 NOTE — Telephone Encounter (Signed)
Pt called. LVM with patient assistances phone number

## 2020-10-01 NOTE — Telephone Encounter (Signed)
Patient states she would like a call back

## 2020-10-05 ENCOUNTER — Other Ambulatory Visit: Payer: Self-pay | Admitting: Internal Medicine

## 2020-10-06 ENCOUNTER — Telehealth: Payer: Self-pay

## 2020-10-06 ENCOUNTER — Other Ambulatory Visit: Payer: Self-pay | Admitting: Family Medicine

## 2020-10-06 ENCOUNTER — Other Ambulatory Visit: Payer: Self-pay

## 2020-10-06 DIAGNOSIS — R32 Unspecified urinary incontinence: Secondary | ICD-10-CM

## 2020-10-06 MED ORDER — OXYBUTYNIN CHLORIDE 5 MG PO TABS: 5.0000 mg | ORAL_TABLET | Freq: Every day | ORAL | 1 refills | Status: DC

## 2020-10-06 NOTE — Telephone Encounter (Signed)
Oxybutynin is not controlled ---- ok to send

## 2020-10-06 NOTE — Telephone Encounter (Signed)
Med sent in

## 2020-10-06 NOTE — Addendum Note (Signed)
Addended by: Sanda Linger on: 10/06/2020 10:27 AM   Modules accepted: Orders

## 2020-10-06 NOTE — Telephone Encounter (Signed)
Requesting: Oxybutynin 5 mg tab Contract:08/09/18 UDS:08/01/18 Last Visit:12/02/19 Next Visit: not scheduled  Last Refill:04/05/20 # 90 with 1 refill Please Advise

## 2020-10-06 NOTE — Telephone Encounter (Signed)
R/equesting:Oxybutynin 5 mg  Contract: 08/09/2018 UDS:08/01/18 Last Visit:12/02/19 Next Visit: not scheduled Last Refill:04/05/20#90 with 1 refill  Please Advise

## 2020-10-08 NOTE — Telephone Encounter (Signed)
Mammogram results placed in mail

## 2020-10-08 NOTE — Telephone Encounter (Signed)
Can you please mail a copy of her mammogram.

## 2020-10-18 DIAGNOSIS — Z794 Long term (current) use of insulin: Secondary | ICD-10-CM | POA: Diagnosis not present

## 2020-10-18 DIAGNOSIS — E039 Hypothyroidism, unspecified: Secondary | ICD-10-CM | POA: Diagnosis not present

## 2020-10-18 DIAGNOSIS — N1832 Chronic kidney disease, stage 3b: Secondary | ICD-10-CM | POA: Diagnosis not present

## 2020-10-18 DIAGNOSIS — E1122 Type 2 diabetes mellitus with diabetic chronic kidney disease: Secondary | ICD-10-CM | POA: Diagnosis not present

## 2020-11-06 DIAGNOSIS — Z23 Encounter for immunization: Secondary | ICD-10-CM | POA: Diagnosis not present

## 2020-11-08 ENCOUNTER — Ambulatory Visit (INDEPENDENT_AMBULATORY_CARE_PROVIDER_SITE_OTHER): Payer: Medicare Other | Admitting: Internal Medicine

## 2020-11-08 ENCOUNTER — Encounter: Payer: Self-pay | Admitting: Internal Medicine

## 2020-11-08 ENCOUNTER — Other Ambulatory Visit: Payer: Self-pay

## 2020-11-08 VITALS — BP 155/70 | HR 68 | Ht 63.5 in | Wt 209.6 lb

## 2020-11-08 DIAGNOSIS — I10 Essential (primary) hypertension: Secondary | ICD-10-CM | POA: Diagnosis not present

## 2020-11-08 DIAGNOSIS — E785 Hyperlipidemia, unspecified: Secondary | ICD-10-CM | POA: Diagnosis not present

## 2020-11-08 DIAGNOSIS — N183 Chronic kidney disease, stage 3 unspecified: Secondary | ICD-10-CM | POA: Diagnosis not present

## 2020-11-08 DIAGNOSIS — Z79899 Other long term (current) drug therapy: Secondary | ICD-10-CM

## 2020-11-08 DIAGNOSIS — I5032 Chronic diastolic (congestive) heart failure: Secondary | ICD-10-CM

## 2020-11-08 DIAGNOSIS — E119 Type 2 diabetes mellitus without complications: Secondary | ICD-10-CM

## 2020-11-08 DIAGNOSIS — E039 Hypothyroidism, unspecified: Secondary | ICD-10-CM | POA: Diagnosis not present

## 2020-11-08 DIAGNOSIS — Z794 Long term (current) use of insulin: Secondary | ICD-10-CM | POA: Diagnosis not present

## 2020-11-08 MED ORDER — LOSARTAN POTASSIUM 50 MG PO TABS: 50.0000 mg | ORAL_TABLET | Freq: Every day | ORAL | 3 refills | Status: DC

## 2020-11-08 MED ORDER — FUROSEMIDE 40 MG PO TABS: 40.0000 mg | ORAL_TABLET | Freq: Every day | ORAL | 3 refills | Status: DC

## 2020-11-08 MED ORDER — HYDRALAZINE HCL 25 MG PO TABS: 25.0000 mg | ORAL_TABLET | Freq: Three times a day (TID) | ORAL | 3 refills | Status: DC

## 2020-11-08 MED ORDER — METOPROLOL TARTRATE 25 MG PO TABS: 25.0000 mg | ORAL_TABLET | Freq: Two times a day (BID) | ORAL | 3 refills | Status: DC

## 2020-11-08 NOTE — Patient Instructions (Signed)
Medication Instructions:  INCREASE losartan to 33m daily *If you need a refill on your cardiac medications before your next appointment, please call your pharmacy*   Lab Work: Lipid Panel, A1c, TSH, CBC, CMET  If you have labs (blood work) drawn today and your tests are completely normal, you will receive your results only by: .Marland KitchenMyChart Message (if you have MyChart) OR . A paper copy in the mail If you have any lab test that is abnormal or we need to change your treatment, we will call you to review the results.   Testing/Procedures: NONE   Follow-Up: At [facility] you and your health needs are our priority.  As part of our continuing mission to provide you with exceptional heart care, we have created designated Provider Care Teams.  These Care Teams include your primary Cardiologist (physician) and Advanced Practice Providers (APPs -  Physician Assistants and Nurse Practitioners) who all work together to provide you with the care you need, when you need it.  We recommend signing up for the patient portal called "MyChart".  Sign up information is provided on this After Visit Summary.  MyChart is used to connect with patients for Virtual Visits (Telemedicine).  Patients are able to view lab/test results, encounter notes, upcoming appointments, etc.  Non-urgent messages can be sent to your provider as well.   To learn more about what you can do with MyChart, go to [email or web address]    Your next appointment:   6 month(s)  The format for your next appointment:   In Person  Provider:   You may see KPixie Casino MD or one of the following Advanced Practice Providers on your designated Care Team:    HAlmyra Deforest PA-C  AFabian Sharp PA-C or   KRoby Lofts [place]ermont   Other Instructions

## 2020-11-08 NOTE — Progress Notes (Signed)
OFFICE NOTE  Chief Complaint:  Follow-up heart failure  Primary Care Physician: Carollee Herter, Alferd Apa, DO  HPI:  Margaret Dyer is a 85 y.o. female with a past medial history significant for tobacco abuse, hypertension, dyslipidemia, cirrhosis, diabetes type 2 and lower extremity edema. She presented the hospital in June 2018 with several weeks of dyspnea on exertion and 10-15 pound weight gain. She had bilateral pleural effusions and an elevated BNP of 219. Echo showed an EF of 60-65% with grade 2 diastolic dysfunction. She underwent right heart catheterization by Dr. Ellyn Hack on 05/18/2017 which showed severe diastolic heart failure and an elevated pulmonary capillary wedge pressure 32 mmHg. There is also felt to be component of pulmonary arterial hypertension. Ultimately she had a [employer] which showed no reversible ischemia. This was performed due to history of coronary calcifications. She also has chronic kidney disease and we preferred to avoid any contrast dye exposure such as with left heart catheterization. She was seen in follow-up by Sharrell Ku, PA-C in July who ordered her stress test and felt that she was doing well from cardiac standpoint. Her weight was 203 pounds. Today it's 200 pounds. She continues to take her diuretic and feels like she is doing well without any worsening shortness of breath.  05/03/2018  Margaret Dyer returns today for follow-up.  Overall she is doing well.  Her weight is significantly lower than it was during the hospital.  Now down about 8 to 10 pounds.  She has been getting some occasional dizziness.  This could be worse with change in position or bending over.  She has a history of neuropathy which can contribute to this and does have a new patient visit with neurology at the end of June.  She is also on twice daily Lasix and I wonder if she could be somewhat over diuresed.  She has no issues with edema.  She is also had some concerns about knots in her legs  which could be varicose veins.  There are signs of bilateral lower extremity venous insufficiency.  11/08/2020  Margaret Dyer is seen today in follow-up. She was last seen last summer by Doreene Adas, PA-C. Overall she seems to have been doing pretty well. Her blood pressure was not well controlled and she was started on lisinopril. This did cause her cough and ultimately she was switched over to losartan. She does have an issue with some degree of chronic kidney disease. She denies any worsening shortness of breath or edema. Blood pressure is again elevated today. She says she generally checks it at home perhaps on a weekly basis and gets around 762 systolic. She says she is not had any recent labs and they were last performed in 2020. She also says is difficult for her to get to her primary care provider's office would like lab work checked today. She is not fasting.  PMHx:  Past Medical History:  Diagnosis Date  . Asthma   . Blood transfusion   . Chronic diastolic CHF (congestive heart failure) ([place])   . CKD (chronic kidney disease), stage III ([place])   . Coronary artery calcification seen on CT scan   . Diabetes mellitus   . Goiter   . Hyperlipidemia   . Hypertension   . Hypothyroidism   . Liver cirrhosis ([place])   . NASH (nonalcoholic steatohepatitis)   . Obesity   . Osteopenia   . Pulmonary hypertension ([place])   . Renal cell carcinoma    a.  prior nephrectomy.  . Right heart failure ([place])   . Thrombocytopenia ([place])     Past Surgical History:  Procedure Laterality Date  . ABDOMINAL HYSTERECTOMY    . APPENDECTOMY    . HEMORRHOID SURGERY    . INTRAMEDULLARY (IM) NAIL INTERTROCHANTERIC Left 12/25/2018   Procedure: INTRAMEDULLARY (IM) NAIL INTERTROCHANTRIC;  Surgeon: Nicholes Stairs, MD;  Location: [place];  Service: Orthopedics;  Laterality: Left;  . KNEE SURGERY    . NEPHRECTOMY  09.2000    FAMHx:  Family History  Problem Relation Age of Onset  . Cancer Sister        bladder    . Kidney cancer Father   . Cancer Father        renal  . COPD Father   . Congestive Heart Failure Father   . Coronary artery disease Other   . Diabetes Other   . Hyperlipidemia Other   . Hypertension Other   . Rheumatologic disease Neg Hx     SOCHx:   reports that she has quit smoking. She has never used smokeless tobacco. She reports that she does not drink alcohol and does not use drugs.  ALLERGIES:  Allergies  Allergen Reactions  . Other Other (See Comments)    Patient has hemorrhaged at least 4 times in her lifetime  . Cefuroxime Axetil Nausea Only    Upset stomach   . Ciprofloxacin Nausea Only    Upset stomach    ROS: Pertinent items noted in HPI and remainder of comprehensive ROS otherwise negative.  HOME MEDS: Current Outpatient Medications on File Prior to Visit  Medication Sig Dispense Refill  . aspirin EC 81 MG EC tablet Take 1 tablet (81 mg total) by mouth daily.    Marland Kitchen docusate sodium (COLACE) 100 MG capsule Take 1 capsule (100 mg total) by mouth 2 (two) times daily. 10 capsule 0  . ezetimibe-simvastatin (VYTORIN) 10-40 MG tablet Take 1 tablet by mouth daily.    . fluconazole (DIFLUCAN) 150 MG tablet Take 1 tablet (150 mg total) by mouth once as needed for up to 1 dose (yeast infection). 1 tablet 0  . insulin glargine (LANTUS) 100 UNIT/ML injection Inject 0.25 mLs (25 Units total) into the skin at bedtime. (Patient taking differently: Inject 30 Units into the skin at bedtime. )    . Insulin Glulisine (APIDRA SOLOSTAR) 100 UNIT/ML Solostar Pen Inject 10-15 Units into the skin 3 (three) times daily before meals.     Marland Kitchen levothyroxine (SYNTHROID, LEVOTHROID) 25 MCG tablet take 1 tablet by mouth daily 30 tablet 5  . Multiple Vitamins-Minerals (CENTRUM SILVER ULTRA WOMENS PO) Take 1 tablet by mouth at bedtime.     Marland Kitchen oxybutynin (DITROPAN) 5 MG tablet Take 1 tablet (5 mg total) by mouth daily. 90 tablet 1  . potassium chloride SA (KLOR-CON) 20 MEQ tablet Take 20 mEq by  mouth at bedtime.    Marland Kitchen spironolactone (ALDACTONE) 25 MG tablet TAKE 1/2 TABLETS BY MOUTH DAILY 45 tablet 1  . sucralfate (CARAFATE) 1 g tablet Take 1 tablet (1 g total) by mouth 4 (four) times daily -  with meals and at bedtime. 90 tablet 0  . traMADol (ULTRAM) 50 MG tablet TAKE 1 TABLET BY MOUTH EVERY 6 HOURS AS NEEDED FOR MODERATE PAIN 60 tablet 0   No current facility-administered medications on file prior to visit.    LABS/IMAGING: No results found for this or any previous visit (from the past 48 hour(s)). No results found.  LIPID PANEL:  Component Value Date/Time   CHOL 200 03/19/2019 1437   TRIG 125.0 03/19/2019 1437   HDL 40.50 03/19/2019 1437   CHOLHDL 5 03/19/2019 1437   VLDL 25.0 03/19/2019 1437   LDLCALC 134 (H) 03/19/2019 1437   LDLDIRECT 89.0 01/19/2015 1659     WEIGHTS: Wt Readings from Last 3 Encounters:  11/08/20 209 lb 9.6 oz (95.1 kg)  04/05/20 200 lb (90.7 kg)  12/02/19 193 lb (87.5 kg)    VITALS: BP (!) 155/70   Pulse 68   Ht 5' 3.5" (1.613 m)   Wt 209 lb 9.6 oz (95.1 kg)   LMP  (LMP Unknown)   SpO2 97%   BMI 36.55 kg/m   EXAM: General appearance: alert, no distress and moderately obese Neck: no carotid bruit, no JVD and thyroid not enlarged, symmetric, no tenderness/mass/nodules Lungs: diminished breath sounds bilaterally Heart: regular rate and rhythm Abdomen: soft, non-tender; bowel sounds normal; no masses,  no organomegaly Extremities: extremities normal, atraumatic, no cyanosis or edema Pulses: 2+ and symmetric Skin: Skin color, texture, turgor normal. No rashes or lesions Neurologic: Grossly normal Psych: Pleasant  EKG: Normal sinus rhythm at 68, low voltage QRS-personally reviewed  ASSESSMENT: 1. Chronic diastolic congestive heart failure, LVEF 60-65% (05/2017) 2. Nonischemic Myoview with EF 71% (06/2017) 3. Hypertension 4. Cirrhosis 5. Insulin-dependent diabetes mellitus 6. Dyslipidemia 7. Hypothyroidism  PLAN: 1.   Mrs.  Dyer denies any worsening shortness of breath or chest pain. Her blood pressure is still not ideally controlled. She is due for refills of her meds which we will put through today and I plan to increase her losartan further to 50 mg daily. We will plan repeat labs as well and encouraged follow-up with her PCP. She can follow-up with me annually or sooner as necessary.  Follow-up with me annually or sooner as necessary.  Pixie Casino, MD, [facility], [place] Director of the Advanced Lipid Disorders &  Cardiovascular Risk Reduction Clinic Diplomate of the American Board of Clinical Lipidology Attending Cardiologist  Direct Dial: [phone]  Fax: [phone]  Website:  [email or web address]   Nadean Corwin Tharun Cappella 11/08/2020, 8:22 PM

## 2020-11-09 ENCOUNTER — Telehealth: Payer: Self-pay

## 2020-11-09 LAB — CBC
Hematocrit: 40.1 % (ref 34.0–46.6)
Hemoglobin: 13.8 g/dL (ref 11.1–15.9)
MCH: 30.9 pg (ref 26.6–33.0)
MCHC: 34.4 g/dL (ref 31.5–35.7)
MCV: 90 fL (ref 79–97)
Platelets: 90 10*3/uL — CL (ref 150–450)
RBC: 4.47 x10E6/uL (ref 3.77–5.28)
RDW: 12.7 % (ref 11.7–15.4)
WBC: 6.4 10*3/uL (ref 3.4–10.8)

## 2020-11-09 LAB — COMPREHENSIVE METABOLIC PANEL
ALT: 24 IU/L (ref 0–32)
AST: 21 IU/L (ref 0–40)
Albumin/Globulin Ratio: 1.7 (ref 1.2–2.2)
Albumin: 4.3 g/dL (ref 3.6–4.6)
Alkaline Phosphatase: 63 IU/L (ref 44–121)
BUN/Creatinine Ratio: 27 (ref 12–28)
BUN: 34 mg/dL — ABNORMAL HIGH (ref 8–27)
Bilirubin Total: 0.4 mg/dL (ref 0.0–1.2)
CO2: 24 mmol/L (ref 20–29)
Calcium: 9.1 mg/dL (ref 8.7–10.3)
Chloride: 104 mmol/L (ref 96–106)
Creatinine, Ser: 1.27 mg/dL — ABNORMAL HIGH (ref 0.57–1.00)
GFR calc Af Amer: 45 mL/min/{1.73_m2} — ABNORMAL LOW (ref >59)
GFR calc non Af Amer: 39 mL/min/{1.73_m2} — ABNORMAL LOW (ref >59)
Globulin, Total: 2.6 g/dL (ref 1.5–4.5)
Glucose: 205 mg/dL — ABNORMAL HIGH (ref 65–99)
Potassium: 4.8 mmol/L (ref 3.5–5.2)
Sodium: 142 mmol/L (ref 134–144)
Total Protein: 6.9 g/dL (ref 6.0–8.5)

## 2020-11-09 LAB — LIPID PANEL
Chol/HDL Ratio: 2.7 ratio (ref 0.0–4.4)
Cholesterol, Total: 122 mg/dL (ref 100–199)
HDL: 45 mg/dL (ref >39)
LDL Chol Calc (NIH): 58 mg/dL (ref 0–99)
Triglycerides: 99 mg/dL (ref 0–149)
VLDL Cholesterol Cal: 19 mg/dL (ref 5–40)

## 2020-11-09 LAB — HEMOGLOBIN A1C
Est. average glucose Bld gHb Est-mCnc: 148 mg/dL
Hgb A1c MFr Bld: 6.8 % — ABNORMAL HIGH (ref 4.8–5.6)

## 2020-11-09 LAB — TSH: TSH: 0.616 u[IU]/mL (ref 0.450–4.500)

## 2020-11-09 NOTE — Telephone Encounter (Signed)
Patient called to report she thinks she may have a reaction to the Covid Booster she got this weekend at [facility] on [address].  I asked Rod Holler if I should direct patient to triage and she stated for me to advise pt to call the location she received the injection.  I advised the patient of this information.

## 2020-11-10 ENCOUNTER — Telehealth: Payer: Self-pay | Admitting: Internal Medicine

## 2020-11-10 ENCOUNTER — Encounter: Payer: Self-pay | Admitting: Internal Medicine

## 2020-11-10 NOTE — Telephone Encounter (Signed)
Lipids are good. A1c is better at 6.8. Creatinine improved at 1.27. Platelets remain low at 90K - they had been 178 last year. TSH is normal. No med changes.  Dr Lemmie Evens.

## 2020-11-10 NOTE — Telephone Encounter (Signed)
Spoke with patient. Pt states she spoke with pharmacy and was told to do warm compresses and that pain could last for a week. Pt states pain is not as bad and redness has gone down. I advised patient if arm gets worse to call back and we will set her up with a appt.

## 2020-11-10 NOTE — Telephone Encounter (Signed)
Margaret Dyer is calling requesting a callback with her lab results. Please advise.

## 2020-11-10 NOTE — Telephone Encounter (Signed)
Returned call to patient. Reviewed results. Advised copy has been mailed and also routed to PCP

## 2020-11-10 NOTE — Telephone Encounter (Signed)
Noted

## 2020-12-21 ENCOUNTER — Other Ambulatory Visit: Payer: Self-pay | Admitting: Family Medicine

## 2020-12-21 DIAGNOSIS — G894 Chronic pain syndrome: Secondary | ICD-10-CM

## 2020-12-21 DIAGNOSIS — M79605 Pain in left leg: Secondary | ICD-10-CM

## 2020-12-24 ENCOUNTER — Other Ambulatory Visit: Payer: Self-pay | Admitting: Family Medicine

## 2020-12-24 DIAGNOSIS — G894 Chronic pain syndrome: Secondary | ICD-10-CM

## 2020-12-24 DIAGNOSIS — M79605 Pain in left leg: Secondary | ICD-10-CM

## 2020-12-27 NOTE — Telephone Encounter (Signed)
Requesting:Tramadol 50 mg  Contract:08/01/2018  UDS:08/01/2018  Last Visit:Unknown Next Visit:12/24/19 Last Refill:06/23/2020  Please Advise

## 2021-01-03 DIAGNOSIS — R300 Dysuria: Secondary | ICD-10-CM | POA: Diagnosis not present

## 2021-01-21 ENCOUNTER — Telehealth: Payer: Self-pay | Admitting: Internal Medicine

## 2021-01-21 NOTE — Telephone Encounter (Signed)
Called patient, she states that she has forgotten to take her third dose of her Hydralazine, and now her blood pressure has increased. She did restart her third dose last night, but this AM her blood pressure was still 199/95. She is having no symptoms, no headaches, no chest pains, no dizziness. She states she feels fine otherwise- just concerned with her BP. I advised that she should continue her medications as prescribed, recheck her blood pressures, but to use the on call system if she becomes concerned, and when to go to ED. Patient verbalized understanding will call on Monday if no changes. Patient aware it can take time to see a change with the third dose. Will route to PharmD to also review and give any other recommendations. Patient also aware to monitor diet.

## 2021-01-21 NOTE — Telephone Encounter (Signed)
Please take an extra 1/2 hydralazine tablet this evening and tomorrow morning, then resume all medication as prescribed, and keep low sodium in diet.  Visit emergency room if BP above 200/100 of she experience dizziness, headaches, blurry vision, or speech changes.  No other changes recommended at this time.

## 2021-01-21 NOTE — Telephone Encounter (Signed)
Pt c/o BP issue: STAT if pt c/o blurred vision, one-sided weakness or slurred speech  1. What are your last 5 BP readings?  199/95 for the last 3 days  2. Are you having any other symptoms (ex. Dizziness, headache, blurred vision, passed out)?   3. What is your BP issue? Blood pressure is up- been taking her blood pressure medicine(Hydralazine) two times a day instead of 3 times- started back taking it 3 times a day

## 2021-01-31 ENCOUNTER — Telehealth: Payer: Self-pay | Admitting: Family Medicine

## 2021-01-31 DIAGNOSIS — D691 Qualitative platelet defects: Secondary | ICD-10-CM

## 2021-01-31 NOTE — Telephone Encounter (Signed)
Pt had labs done in December through cardio. Please advise on referral

## 2021-01-31 NOTE — Telephone Encounter (Signed)
Ok to send referral

## 2021-01-31 NOTE — Telephone Encounter (Signed)
Patient would like a referral to Dr. Rudell Cobb. Marin Olp, MD ([place] are low)

## 2021-02-01 MED ORDER — HYDRALAZINE HCL 50 MG PO TABS: 50.0000 mg | ORAL_TABLET | Freq: Three times a day (TID) | ORAL | 3 refills | Status: DC

## 2021-02-01 NOTE — Telephone Encounter (Signed)
Pt called in and stated she has a week worth of Bp readings   2/23-  152/67  2/24-  194/93 2/25-  186/88 2/26- 191/87 2/27-  153/68 2/28- 157/68 3/1-  182/88   Pt is taking meds they way they are written.  Pt also wanted to remind provider she only has the 1 kidney.    Pt denies no symptoms other then  maybe just a mild  headache that is it.    Best number [phone]

## 2021-02-01 NOTE — Telephone Encounter (Signed)
Referral placed

## 2021-02-01 NOTE — Telephone Encounter (Signed)
Pt updated with MD's recommendations and verbalized understanding. New orders placed.

## 2021-02-01 NOTE — Telephone Encounter (Signed)
BP remains very high- would increase hydralazine to 50 mg every 8 hours - that is okay with her kidney.  Dr Debara Pickett

## 2021-02-01 NOTE — Telephone Encounter (Signed)
Pt called in stating she wanted to report her BP readings for the past week (BP readings listed below). Pt denies dizziness, blurred vision, and report only a mild headache.   Pt state if any med adjustment are made, she wanted MD to remember she only has one kidney.  2/23-  152/67  2/24-  194/93 2/25-  186/88 2/26- 191/87 2/27-  153/68 2/28- 157/68 3/1-  937/16

## 2021-02-04 ENCOUNTER — Encounter (HOSPITAL_BASED_OUTPATIENT_CLINIC_OR_DEPARTMENT_OTHER): Payer: Self-pay | Admitting: Emergency Medicine

## 2021-02-04 ENCOUNTER — Other Ambulatory Visit: Payer: Self-pay

## 2021-02-04 ENCOUNTER — Emergency Department (HOSPITAL_BASED_OUTPATIENT_CLINIC_OR_DEPARTMENT_OTHER)
Admission: EM | Admit: 2021-02-04 | Discharge: 2021-02-04 | Disposition: A | Discharge status: Home / Self Care | Payer: Medicare Other | Attending: Emergency Medicine | Admitting: Emergency Medicine

## 2021-02-04 ENCOUNTER — Emergency Department (HOSPITAL_BASED_OUTPATIENT_CLINIC_OR_DEPARTMENT_OTHER): Payer: Medicare Other

## 2021-02-04 DIAGNOSIS — Z794 Long term (current) use of insulin: Secondary | ICD-10-CM | POA: Insufficient documentation

## 2021-02-04 DIAGNOSIS — J45909 Unspecified asthma, uncomplicated: Secondary | ICD-10-CM | POA: Insufficient documentation

## 2021-02-04 DIAGNOSIS — I5032 Chronic diastolic (congestive) heart failure: Secondary | ICD-10-CM | POA: Diagnosis not present

## 2021-02-04 DIAGNOSIS — Z8616 Personal history of COVID-19: Secondary | ICD-10-CM | POA: Diagnosis not present

## 2021-02-04 DIAGNOSIS — I130 Hypertensive heart and chronic kidney disease with heart failure and stage 1 through stage 4 chronic kidney disease, or unspecified chronic kidney disease: Secondary | ICD-10-CM | POA: Diagnosis not present

## 2021-02-04 DIAGNOSIS — I10 Essential (primary) hypertension: Secondary | ICD-10-CM

## 2021-02-04 DIAGNOSIS — R519 Headache, unspecified: Secondary | ICD-10-CM | POA: Diagnosis not present

## 2021-02-04 DIAGNOSIS — E1122 Type 2 diabetes mellitus with diabetic chronic kidney disease: Secondary | ICD-10-CM | POA: Diagnosis not present

## 2021-02-04 DIAGNOSIS — Z7982 Long term (current) use of aspirin: Secondary | ICD-10-CM | POA: Insufficient documentation

## 2021-02-04 DIAGNOSIS — Z87891 Personal history of nicotine dependence: Secondary | ICD-10-CM | POA: Insufficient documentation

## 2021-02-04 DIAGNOSIS — R42 Dizziness and giddiness: Secondary | ICD-10-CM

## 2021-02-04 DIAGNOSIS — Z79899 Other long term (current) drug therapy: Secondary | ICD-10-CM | POA: Diagnosis not present

## 2021-02-04 DIAGNOSIS — N183 Chronic kidney disease, stage 3 unspecified: Secondary | ICD-10-CM | POA: Insufficient documentation

## 2021-02-04 DIAGNOSIS — R110 Nausea: Secondary | ICD-10-CM | POA: Diagnosis not present

## 2021-02-04 DIAGNOSIS — E039 Hypothyroidism, unspecified: Secondary | ICD-10-CM | POA: Diagnosis not present

## 2021-02-04 DIAGNOSIS — I499 Cardiac arrhythmia, unspecified: Secondary | ICD-10-CM | POA: Diagnosis not present

## 2021-02-04 DIAGNOSIS — G4489 Other headache syndrome: Secondary | ICD-10-CM | POA: Diagnosis not present

## 2021-02-04 LAB — COMPREHENSIVE METABOLIC PANEL
ALT: 27 U/L (ref 0–44)
AST: 29 U/L (ref 15–41)
Albumin: 4.1 g/dL (ref 3.5–5.0)
Alkaline Phosphatase: 48 U/L (ref 38–126)
Anion gap: 12 (ref 5–15)
BUN: 43 mg/dL — ABNORMAL HIGH (ref 8–23)
CO2: 24 mmol/L (ref 22–32)
Calcium: 9.3 mg/dL (ref 8.9–10.3)
Chloride: 102 mmol/L (ref 98–111)
Creatinine, Ser: 1.32 mg/dL — ABNORMAL HIGH (ref 0.44–1.00)
GFR, Estimated: 40 mL/min — ABNORMAL LOW (ref >60)
Glucose, Bld: 159 mg/dL — ABNORMAL HIGH (ref 70–99)
Potassium: 4 mmol/L (ref 3.5–5.1)
Sodium: 138 mmol/L (ref 135–145)
Total Bilirubin: 0.9 mg/dL (ref 0.3–1.2)
Total Protein: 7.3 g/dL (ref 6.5–8.1)

## 2021-02-04 LAB — CBC WITH DIFFERENTIAL/PLATELET
Abs Immature Granulocytes: 0.04 10*3/uL (ref 0.00–0.07)
Basophils Absolute: 0 10*3/uL (ref 0.0–0.1)
Basophils Relative: 1 %
Eosinophils Absolute: 0.1 10*3/uL (ref 0.0–0.5)
Eosinophils Relative: 1 %
HCT: 42.4 % (ref 36.0–46.0)
Hemoglobin: 14.3 g/dL (ref 12.0–15.0)
Immature Granulocytes: 1 %
Lymphocytes Relative: 19 %
Lymphs Abs: 1.4 10*3/uL (ref 0.7–4.0)
MCH: 30 pg (ref 26.0–34.0)
MCHC: 33.7 g/dL (ref 30.0–36.0)
MCV: 88.9 fL (ref 80.0–100.0)
Monocytes Absolute: 0.8 10*3/uL (ref 0.1–1.0)
Monocytes Relative: 10 %
Neutro Abs: 5.2 10*3/uL (ref 1.7–7.7)
Neutrophils Relative %: 68 %
Platelets: 110 10*3/uL — ABNORMAL LOW (ref 150–400)
RBC: 4.77 MIL/uL (ref 3.87–5.11)
RDW: 13.2 % (ref 11.5–15.5)
WBC: 7.5 10*3/uL (ref 4.0–10.5)
nRBC: 0 % (ref 0.0–0.2)

## 2021-02-04 LAB — URINALYSIS, ROUTINE W REFLEX MICROSCOPIC
Bilirubin Urine: NEGATIVE
Glucose, UA: NEGATIVE mg/dL
Hgb urine dipstick: NEGATIVE
Ketones, ur: NEGATIVE mg/dL
Leukocytes,Ua: NEGATIVE
Nitrite: NEGATIVE
Protein, ur: NEGATIVE mg/dL
Specific Gravity, Urine: 1.02 (ref 1.005–1.030)
pH: 6.5 (ref 5.0–8.0)

## 2021-02-04 MED ORDER — METOCLOPRAMIDE HCL 5 MG/ML IJ SOLN
10.0000 mg | Freq: Once | INTRAMUSCULAR | Status: AC
  Administered 2021-02-04: 10 mg via INTRAVENOUS
  Filled 2021-02-04: qty 2

## 2021-02-04 MED ORDER — DIPHENHYDRAMINE HCL 50 MG/ML IJ SOLN
12.5000 mg | Freq: Once | INTRAMUSCULAR | Status: AC
  Administered 2021-02-04: 12.5 mg via INTRAVENOUS
  Filled 2021-02-04: qty 1

## 2021-02-04 NOTE — Discharge Instructions (Signed)
Your dizziness is likely from your blood pressure medication changes.   You can try and continue your blood pressure medicines as prescribed.  If you feel very dizzy then you may need to call your doctor and cut back on your medicines.  See your doctor for follow-up  Return to ER if you have worse dizziness, headache, vomiting, weakness

## 2021-02-04 NOTE — ED Provider Notes (Signed)
[facility] Provider Note   CSN: [id number] Arrival date & time: 02/04/21  2127     History Chief Complaint  Patient presents with  . Dizziness    Margaret Dyer is a 86 y.o. female history of CKD, diabetes, hypertension who presented with dizziness and hypertension.  Patient states that her blood pressure has been running elevated for last several weeks.  Patient saw her cardiologist 2 weeks ago and her hydralazine was increased to 50 mg 3 times daily from 25 mg 3 times daily.  She is also on Lasix and Cozaar and metoprolol and spironolactone but the doses were not changed recently.  Patient states that today she woke up with a headache and felt dizzy.  She denies any trouble speaking.  She denies any focal weakness.   The history is provided by the patient.       Past Medical History:  Diagnosis Date  . Asthma   . Blood transfusion   . Chronic diastolic CHF (congestive heart failure) ([place])   . CKD (chronic kidney disease), stage III ([place])   . Coronary artery calcification seen on CT scan   . Diabetes mellitus   . Goiter   . Hyperlipidemia   . Hypertension   . Hypothyroidism   . Liver cirrhosis ([place])   . NASH (nonalcoholic steatohepatitis)   . Obesity   . Osteopenia   . Pulmonary hypertension ([place])   . Renal cell carcinoma    a. prior nephrectomy.  . Right heart failure ([place])   . Thrombocytopenia [facility])     Patient Active Problem List   Diagnosis Date Noted  . Closed fracture of maxillary sinus ([place]) 07/10/2019  . UTI (urinary tract infection) 07/03/2019  . Food poisoning 07/03/2019  . Bacteremia 07/03/2019  . Exposure to COVID-19 virus 06/16/2019  . Closed left hip fracture ([place]) 12/24/2018  . Dysuria 12/17/2017  . Dizziness 12/17/2017  . Community acquired pneumonia 12/17/2017  . Pneumonia 12/01/2017  . Urinary incontinence 11/09/2017  . Tic 11/09/2017  . CKD (chronic kidney disease), stage III ([place]) 09/12/2017  . Pulmonary  hypertension ([place]) 09/12/2017  . Chronic diastolic CHF (congestive heart failure) ([place]) 06/05/2017  . Oral candida 05/29/2017  . Vitamin D deficiency 05/29/2017  . Chronic respiratory failure ([place]) 05/24/2017  . Anxiety 05/24/2017  . HCAP (healthcare-associated pneumonia) 05/22/2017  . Right heart failure ([place]) 05/17/2017  . Acute diastolic CHF (congestive heart failure) ([place])   . Exertional dyspnea 05/13/2017  . Hypertension 05/13/2017  . Asthma 05/13/2017  . DOE (dyspnea on exertion) 05/13/2017  . Diabetes mellitus type 2, insulin dependent ([place]) 05/13/2017  . Acute kidney injury ([place]) 05/13/2017  . History of nephrectomy 05/13/2017  . Hyperlipidemia LDL goal <70 05/13/2017  . Liver cirrhosis secondary to NASH ([place]) 05/13/2017  . Thrombocytopenia ([place]) 05/13/2017  . Renal cell carcinoma 05/13/2017  . Hypothyroidism 05/13/2017  . Hypersplenism 05/13/2017  . Osteoarthritis 05/13/2017  . Overactive bladder 05/13/2017  . URI, acute 02/22/2017  . Abrasion, left knee, initial encounter 02/22/2017  . CTS (carpal tunnel syndrome) 03/04/2016  . Bilateral contusion of ribs 10/05/2015  . Dental abscess 04/15/2014  . Claudication, intermittent ([place]) 12/05/2013  . Obesity (BMI 30-39.9) 07/22/2013  . Back pain 05/26/2013  . Platelets decreased ([place]) 05/26/2013  . Breast pain, right 04/21/2013  . Anxiety and depression 12/05/2012  . Bronchitis 11/06/2012  . HEMATURIA UNSPECIFIED 02/17/2011  . Hyperlipidemia 09/08/2010  . B12 DEFICIENCY 07/22/2010  . DIZZINESS 07/04/2010  . Essential hypertension  06/21/2010  . GOUT, UNSPECIFIED 01/27/2010  . NEPHRECTOMY, HX OF 12/02/2009  . NECK PAIN, LEFT 09/04/2008  . PARESTHESIA 09/02/2007  . GOITER NOS 04/23/2007  . Diabetes mellitus ([place]) 04/23/2007  . Asthma 04/23/2007  . Renal stone 04/23/2007  . OSTEOPENIA 04/23/2007  . CARCINOMA, RENAL CELL 08/10/1999    Past Surgical History:  Procedure Laterality Date  . ABDOMINAL HYSTERECTOMY     . APPENDECTOMY    . HEMORRHOID SURGERY    . INTRAMEDULLARY (IM) NAIL INTERTROCHANTERIC Left 12/25/2018   Procedure: INTRAMEDULLARY (IM) NAIL INTERTROCHANTRIC;  Surgeon: Nicholes Stairs, MD;  Location: [facility];  Service: Orthopedics;  Laterality: Left;  . KNEE SURGERY    . NEPHRECTOMY  09.2000     OB History   No obstetric history on file.     Family History  Problem Relation Age of Onset  . Cancer Sister        bladder  . Kidney cancer Father   . Cancer Father        renal  . COPD Father   . Congestive Heart Failure Father   . Coronary artery disease Other   . Diabetes Other   . Hyperlipidemia Other   . Hypertension Other   . Rheumatologic disease Neg Hx     Social History   Tobacco Use  . Smoking status: Former Research scientist (life sciences)  . Smokeless tobacco: Never Used  . Tobacco comment: 05/30/18 none in 40 years  Vaping Use  . Vaping Use: Never used  Substance Use Topics  . Alcohol use: No    Alcohol/week: 0.0 standard drinks  . Drug use: No    Home Medications Prior to Admission medications   Medication Sig Start Date End Date Taking? Authorizing Provider  aspirin EC 81 MG EC tablet Take 1 tablet (81 mg total) by mouth daily. 07/07/19   Geradine Girt, DO  docusate sodium (COLACE) 100 MG capsule Take 1 capsule (100 mg total) by mouth 2 (two) times daily. 12/30/18   Bonnell Public, MD  ezetimibe-simvastatin (VYTORIN) 10-40 MG tablet Take 1 tablet by mouth daily.    [provider]  fluconazole (DIFLUCAN) 150 MG tablet Take 1 tablet (150 mg total) by mouth once as needed for up to 1 dose (yeast infection). 07/06/19   Geradine Girt, DO  furosemide (LASIX) 40 MG tablet Take 1 tablet (40 mg total) by mouth daily. 11/08/20   Hilty, Nadean Corwin, MD  hydrALAZINE (APRESOLINE) 50 MG tablet Take 1 tablet (50 mg total) by mouth every 8 (eight) hours. 02/01/21   Hilty, Nadean Corwin, MD  insulin glargine (LANTUS) 100 UNIT/ML injection Inject 0.25 mLs (25 Units total) into the skin at  bedtime. Patient taking differently: Inject 30 Units into the skin at bedtime.  07/06/19   Geradine Girt, DO  Insulin Glulisine (APIDRA SOLOSTAR) 100 UNIT/ML Solostar Pen Inject 10-15 Units into the skin 3 (three) times daily before meals.     [provider]  levothyroxine (SYNTHROID, LEVOTHROID) 25 MCG tablet take 1 tablet by mouth daily 07/13/17   Carollee Herter, Alferd Apa, DO  losartan (COZAAR) 50 MG tablet Take 1 tablet (50 mg total) by mouth daily. 11/08/20   Hilty, Nadean Corwin, MD  metoprolol tartrate (LOPRESSOR) 25 MG tablet Take 1 tablet (25 mg total) by mouth 2 (two) times daily. 11/08/20   Hilty, Nadean Corwin, MD  Multiple Vitamins-Minerals (CENTRUM SILVER ULTRA WOMENS PO) Take 1 tablet by mouth at bedtime.     [provider]  oxybutynin (DITROPAN) 5 MG tablet Take 1 tablet (5 mg total) by mouth daily. 10/06/20   Ann Held, DO  potassium chloride SA (KLOR-CON) 20 MEQ tablet Take 20 mEq by mouth at bedtime.    [provider]  spironolactone (ALDACTONE) 25 MG tablet TAKE 1/2 TABLETS BY MOUTH DAILY 09/24/20   Duke, Tami Lin, PA  sucralfate (CARAFATE) 1 g tablet Take 1 tablet (1 g total) by mouth 4 (four) times daily -  with meals and at bedtime. 12/02/19   Ann Held, DO  traMADol (ULTRAM) 50 MG tablet TAKE 1 TABLET BY MOUTH EVERY 6 HOURS AS NEEDED FOR MODERATE PAIN 12/27/20   Ann Held, DO    Allergies    Other, Cefuroxime axetil, and Ciprofloxacin  Review of Systems   Review of Systems  Neurological: Positive for dizziness.  All other systems reviewed and are negative.   Physical Exam Updated Vital Signs BP (!) 142/58   Pulse 60   Temp 98.3 F (36.8 C) (Oral)   Resp 16   Ht 5' 3.5" (1.613 m)   Wt 91.2 kg   LMP  (LMP Unknown)   SpO2 97%   BMI 35.05 kg/m   Physical Exam Vitals and nursing note reviewed.  Constitutional:      Comments: Slightly uncomfortable  HENT:     Head: Normocephalic.     Nose: Nose normal.      Mouth/Throat:     Mouth: Mucous membranes are moist.  Eyes:     Extraocular Movements: Extraocular movements intact.     Pupils: Pupils are equal, round, and reactive to light.  Cardiovascular:     Rate and Rhythm: Normal rate and regular rhythm.     Pulses: Normal pulses.     Heart sounds: Normal heart sounds.  Pulmonary:     Effort: Pulmonary effort is normal.     Breath sounds: Normal breath sounds.  Abdominal:     General: Abdomen is flat.     Palpations: Abdomen is soft.  Musculoskeletal:        General: Normal range of motion.     Cervical back: Normal range of motion and neck supple.  Skin:    General: Skin is warm.     Capillary Refill: Capillary refill takes less than 2 seconds.  Neurological:     General: No focal deficit present.     Mental Status: She is oriented to person, place, and time.     Comments: Cranial nerves II to XII intact.  Patient has normal strength and sensation bilaterally.  Patient has normal finger-to-nose bilaterally.  Psychiatric:        Mood and Affect: Mood normal.        Behavior: Behavior normal.     ED Results / Procedures / Treatments   Labs (all labs ordered are listed, but only abnormal results are displayed) Labs Reviewed  CBC WITH DIFFERENTIAL/PLATELET - Abnormal; Notable for the following components:      Result Value   Platelets 110 (*)    All other components within normal limits  COMPREHENSIVE METABOLIC PANEL - Abnormal; Notable for the following components:   Glucose, Bld 159 (*)    BUN 43 (*)    Creatinine, Ser 1.32 (*)    GFR, Estimated 40 (*)    All other components within normal limits  URINALYSIS, ROUTINE W REFLEX MICROSCOPIC    EKG EKG Interpretation  Date/Time:  Friday February 04 2021 21:39:26 EST Ventricular Rate:  59 PR Interval:    QRS Duration: 97 QT Interval:  452 QTC Calculation: 448 R Axis:   25 Text Interpretation: Sinus rhythm Atrial premature complex No significant change since last tracing  Confirmed by Wandra Arthurs [id number]) on 02/04/2021 9:41:55 PM   Radiology CT Head Wo Contrast  Result Date: 02/04/2021 CLINICAL DATA:  Dizziness EXAM: CT HEAD WITHOUT CONTRAST TECHNIQUE: Contiguous axial images were obtained from the base of the skull through the vertex without intravenous contrast. COMPARISON:  12/28/2018 FINDINGS: Brain: Normal anatomic configuration of the brain. Remote lacunar infarcts are noted within the left thalamus and right cerebellar hemisphere. Mild periventricular white matter changes are present likely reflecting the sequela of small vessel ischemia. No abnormal intra or extra-axial mass lesion or fluid collection. No abnormal mass effect or midline shift. No evidence of acute intracranial hemorrhage or infarct. The ventricular size is normal. The cerebellum is unremarkable. Vascular: Extensive atherosclerotic calcification is seen within the distal vertebral arteries and carotid siphons. No asymmetric hyperdense vasculature is seen at the skull base Skull: Intact Sinuses/Orbits: The visualized paranasal sinuses are clear. The orbits are unremarkable. Other: Mastoid air cells and middle ear cavities are clear. IMPRESSION: No acute intracranial abnormality. Electronically Signed   By: Fidela Salisbury MD   On: 02/04/2021 22:40    Procedures Procedures   Medications Ordered in ED Medications  metoCLOPramide (REGLAN) injection 10 mg (10 mg Intravenous Given 02/04/21 2159)  diphenhydrAMINE (BENADRYL) injection 12.5 mg (12.5 mg Intravenous Given 02/04/21 2159)    ED Course  I have reviewed the triage vital signs and the nursing notes.  Pertinent labs & imaging results that were available during my care of the patient were reviewed by me and considered in my medical decision making (see chart for details).    MDM Rules/Calculators/A&P                         ALVETA QUINTELA is a 86 y.o. female here presenting with dizziness and headache.  Patient is hypertensive for her blood  pressure is only 150s right now.  Patient states that her blood pressure went up to 200 this morning.  Consider head bleed versus migraines.  I have low suspicion for posterior circulation stroke.  Plan to get CT head and if negative does not need MRI.  We will also get basic blood work and give migraine cocktail.  Will do orthostatics as well.  11:26 PM Labs unremarkable.  CT head unremarkable.  Patient's blood pressure is down in the 140s.  Patient is not orthostatic.  I think patient is likely symptomatic from her blood pressure medicine increases recently.  I told her that if she can tolerate that she should continue her current blood pressure management but if she cannot, she may need to reduce the dose    Final Clinical Impression(s) / ED Diagnoses Final diagnoses:  Dizziness  Hypertension, unspecified type  Nonintractable headache, unspecified chronicity pattern, unspecified headache type    Rx / DC Orders ED Discharge Orders    None       Drenda Freeze, MD 02/04/21 2327

## 2021-02-04 NOTE — ED Triage Notes (Signed)
Pt c/o dizziness, nausea and headache. Pt reports MD increased BP medication 2 days ago.

## 2021-02-08 ENCOUNTER — Telehealth: Payer: Self-pay | Admitting: *Deleted

## 2021-02-08 NOTE — Telephone Encounter (Signed)
Per referral lvm of upcoming appts. Mailed welcome packet with calendar

## 2021-02-09 ENCOUNTER — Telehealth: Payer: Self-pay | Admitting: Internal Medicine

## 2021-02-09 NOTE — Telephone Encounter (Signed)
Spoke with pt, she reports she did not feel well on the 25 mg of the hydralazine and since the increase she is having a daily headache and lightheadedness. She went to the ER for evaluation due to her bp getting up to 207/105. Her daughter reports a lot of the bp issues is related to stress and before going to the ER she had gotten a call that upset her. The patient would like to change off the hydralazine because she does not like the way she feels on that medication. She wants something that will be kind to her 1 kidney. Aware will forward to dr hilty to review and advise

## 2021-02-09 NOTE — Telephone Encounter (Signed)
I'm not sure she has tried amlodipine - recommend starting amlodipine 5 mg daily - decrease hydralazine to 25 mg q8 hrs for now - may be able to wean off it. She will need an office visit for BP management next week with me or APP.  Dr Lemmie Evens

## 2021-02-09 NOTE — Telephone Encounter (Signed)
Pt c/o medication issue:  1. Name of Medication: hydrALAZINE (APRESOLINE) 50 MG tablet  2. How are you currently taking this medication (dosage and times per day)? 1 tablet by mouth every 8 hours   3. Are you having a reaction (difficulty breathing--STAT)? Yes   4. What is your medication issue? Not bringing BP down causing dizziness and bad headaches. Had to go to [facility] in [place] Friday night, 02/04/21. They advised her they believe this is due to the increase.     Pt c/o BP issue: STAT if pt c/o blurred vision, one-sided weakness or slurred speech  1. What are your last 5 BP readings?  03/04 201/105 03/05 180/83 03/06 195/92 03/07 187/92 03/08 178/90 03/09 172/88  2. Are you having any other symptoms (ex. Dizziness, headache, blurred vision, passed out)? Currently has a headache and lightheaded   3. What is your BP issue? Hypertension still occurring since medication increase and symptoms of headache, lightheadedness, as well as dizziness have developed.

## 2021-02-10 MED ORDER — HYDRALAZINE HCL 25 MG PO TABS: 25.0000 mg | ORAL_TABLET | Freq: Three times a day (TID) | ORAL | Status: DC

## 2021-02-10 MED ORDER — AMLODIPINE BESYLATE 5 MG PO TABS
5.0000 mg | ORAL_TABLET | Freq: Every day | ORAL | 6 refills | Status: DC
Start: 2021-02-10 — End: 2021-04-08

## 2021-02-10 NOTE — Telephone Encounter (Signed)
Spoke with pt, aware of dr hilty's recommendations. New script sent to the pharmacy, she did not want to make a follow up appointment right now. She will call with any problems.

## 2021-02-24 ENCOUNTER — Other Ambulatory Visit: Payer: Self-pay | Admitting: Internal Medicine

## 2021-03-02 ENCOUNTER — Ambulatory Visit: Payer: Medicare Other | Admitting: Family

## 2021-03-02 ENCOUNTER — Other Ambulatory Visit: Payer: Medicare Other

## 2021-03-25 ENCOUNTER — Other Ambulatory Visit: Payer: Self-pay | Admitting: Family Medicine

## 2021-03-25 DIAGNOSIS — R32 Unspecified urinary incontinence: Secondary | ICD-10-CM

## 2021-03-28 ENCOUNTER — Inpatient Hospital Stay (HOSPITAL_BASED_OUTPATIENT_CLINIC_OR_DEPARTMENT_OTHER): Payer: Medicare Other | Admitting: Hematology & Oncology

## 2021-03-28 ENCOUNTER — Encounter: Payer: Self-pay | Admitting: Hematology & Oncology

## 2021-03-28 ENCOUNTER — Other Ambulatory Visit: Payer: Self-pay

## 2021-03-28 ENCOUNTER — Inpatient Hospital Stay: Payer: Medicare Other | Attending: Hematology & Oncology

## 2021-03-28 VITALS — BP 133/51 | HR 61 | Temp 98.2°F | Resp 18 | Ht 63.5 in | Wt 207.0 lb

## 2021-03-28 DIAGNOSIS — K7581 Nonalcoholic steatohepatitis (NASH): Secondary | ICD-10-CM | POA: Insufficient documentation

## 2021-03-28 DIAGNOSIS — Z7982 Long term (current) use of aspirin: Secondary | ICD-10-CM | POA: Diagnosis not present

## 2021-03-28 DIAGNOSIS — D696 Thrombocytopenia, unspecified: Secondary | ICD-10-CM

## 2021-03-28 DIAGNOSIS — D6959 Other secondary thrombocytopenia: Secondary | ICD-10-CM | POA: Diagnosis not present

## 2021-03-28 DIAGNOSIS — Z79899 Other long term (current) drug therapy: Secondary | ICD-10-CM | POA: Insufficient documentation

## 2021-03-28 DIAGNOSIS — K7460 Unspecified cirrhosis of liver: Secondary | ICD-10-CM | POA: Insufficient documentation

## 2021-03-28 LAB — CBC WITH DIFFERENTIAL (CANCER CENTER ONLY)
Abs Immature Granulocytes: 0.03 10*3/uL (ref 0.00–0.07)
Basophils Absolute: 0.1 10*3/uL (ref 0.0–0.1)
Basophils Relative: 1 %
Eosinophils Absolute: 0.1 10*3/uL (ref 0.0–0.5)
Eosinophils Relative: 2 %
HCT: 42.2 % (ref 36.0–46.0)
Hemoglobin: 14.2 g/dL (ref 12.0–15.0)
Immature Granulocytes: 0 %
Lymphocytes Relative: 16 %
Lymphs Abs: 1.2 10*3/uL (ref 0.7–4.0)
MCH: 30.1 pg (ref 26.0–34.0)
MCHC: 33.6 g/dL (ref 30.0–36.0)
MCV: 89.6 fL (ref 80.0–100.0)
Monocytes Absolute: 0.7 10*3/uL (ref 0.1–1.0)
Monocytes Relative: 9 %
Neutro Abs: 5.5 10*3/uL (ref 1.7–7.7)
Neutrophils Relative %: 72 %
Platelet Count: 128 10*3/uL — ABNORMAL LOW (ref 150–400)
RBC: 4.71 MIL/uL (ref 3.87–5.11)
RDW: 13 % (ref 11.5–15.5)
WBC Count: 7.6 10*3/uL (ref 4.0–10.5)
nRBC: 0 % (ref 0.0–0.2)

## 2021-03-28 LAB — CMP (CANCER CENTER ONLY)
ALT: 19 U/L (ref 0–44)
AST: 19 U/L (ref 15–41)
Albumin: 4.2 g/dL (ref 3.5–5.0)
Alkaline Phosphatase: 46 U/L (ref 38–126)
Anion gap: 9 (ref 5–15)
BUN: 45 mg/dL — ABNORMAL HIGH (ref 8–23)
CO2: 29 mmol/L (ref 22–32)
Calcium: 9.8 mg/dL (ref 8.9–10.3)
Chloride: 104 mmol/L (ref 98–111)
Creatinine: 1.41 mg/dL — ABNORMAL HIGH (ref 0.44–1.00)
GFR, Estimated: 37 mL/min — ABNORMAL LOW (ref >60)
Glucose, Bld: 159 mg/dL — ABNORMAL HIGH (ref 70–99)
Potassium: 4.4 mmol/L (ref 3.5–5.1)
Sodium: 142 mmol/L (ref 135–145)
Total Bilirubin: 0.6 mg/dL (ref 0.3–1.2)
Total Protein: 7.1 g/dL (ref 6.5–8.1)

## 2021-03-28 LAB — PLATELET BY CITRATE

## 2021-03-28 LAB — SAVE SMEAR(SSMR), FOR PROVIDER SLIDE REVIEW

## 2021-03-28 NOTE — Progress Notes (Signed)
Hematology and Oncology Follow Up Visit  Margaret Dyer [id number] Oct 20, 1935 86 y.o. 03/28/2021   Principle Diagnosis:   Thrombocytopenia secondary to NASH  Current Therapy:    Observation     Interim History:  Margaret Dyer is back for follow-up.  We last saw her about 3 years ago.  At that time, she had thrombocytopenia.  She has NASH.  This is being followed by her family doctor.  She has multiple medications.  She is on 6 different medications for blood pressure.  She is had some issues with her platelets.  I think she was will have issues with her platelets.  Back in December 2021, her blood count was 90,000.  In March 2022 her platelet count was 110,000.  She is has some bruising.  I suspect this probably is from the baby aspirin that she takes.  In addition, she is on multiple other medications that certainly could increase her risk of bruising.  She last had a ultrasound of her abdomen in April 2020.  Probably would not be a bad idea to get another 1 to see how her spleen looks.  She has had no change in bowel or bladder habits.  She has had no issues with cough or shortness of breath.  There has been no problems with COVID.  Currently, I would say performance status is ECOG 2.  Medications:  Current Outpatient Medications:  .  amLODipine (NORVASC) 5 MG tablet, Take 1 tablet (5 mg total) by mouth daily., Disp: 30 tablet, Rfl: 6 .  aspirin EC 81 MG EC tablet, Take 1 tablet (81 mg total) by mouth daily., Disp:  , Rfl:  .  docusate sodium (COLACE) 100 MG capsule, Take 1 capsule (100 mg total) by mouth 2 (two) times daily., Disp: 10 capsule, Rfl: 0 .  ezetimibe-simvastatin (VYTORIN) 10-40 MG tablet, Take 1 tablet by mouth daily., Disp: , Rfl:  .  fluconazole (DIFLUCAN) 150 MG tablet, Take 1 tablet (150 mg total) by mouth once as needed for up to 1 dose (yeast infection)., Disp: 1 tablet, Rfl: 0 .  furosemide (LASIX) 40 MG tablet, Take 1 tablet (40 mg total) by mouth daily.,  Disp: 90 tablet, Rfl: 3 .  hydrALAZINE (APRESOLINE) 25 MG tablet, Take 1 tablet (25 mg total) by mouth every 8 (eight) hours., Disp: , Rfl:  .  insulin glargine (LANTUS) 100 UNIT/ML injection, Inject 0.25 mLs (25 Units total) into the skin at bedtime. (Patient taking differently: Inject 30 Units into the skin at bedtime. ), Disp: , Rfl:  .  Insulin Glulisine (APIDRA SOLOSTAR) 100 UNIT/ML Solostar Pen, Inject 10-15 Units into the skin 3 (three) times daily before meals. , Disp: , Rfl:  .  levothyroxine (SYNTHROID, LEVOTHROID) 25 MCG tablet, take 1 tablet by mouth daily, Disp: 30 tablet, Rfl: 5 .  losartan (COZAAR) 50 MG tablet, Take 1 tablet (50 mg total) by mouth daily., Disp: 90 tablet, Rfl: 3 .  metoprolol tartrate (LOPRESSOR) 25 MG tablet, Take 1 tablet (25 mg total) by mouth 2 (two) times daily., Disp: 180 tablet, Rfl: 3 .  Multiple Vitamins-Minerals (CENTRUM SILVER ULTRA WOMENS PO), Take 1 tablet by mouth at bedtime. , Disp: , Rfl:  .  oxybutynin (DITROPAN) 5 MG tablet, Take 1 tablet (5 mg total) by mouth daily., Disp: 90 tablet, Rfl: 1 .  potassium chloride SA (KLOR-CON) 20 MEQ tablet, TAKE 1 TABLET BY MOUTH DAILY AT BEDTIME, Disp: 30 tablet, Rfl: 11 .  spironolactone (ALDACTONE) 25 MG tablet,  TAKE 1/2 TABLETS BY MOUTH DAILY, Disp: 45 tablet, Rfl: 1 .  sucralfate (CARAFATE) 1 g tablet, Take 1 tablet (1 g total) by mouth 4 (four) times daily -  with meals and at bedtime., Disp: 90 tablet, Rfl: 0 .  traMADol (ULTRAM) 50 MG tablet, TAKE 1 TABLET BY MOUTH EVERY 6 HOURS AS NEEDED FOR MODERATE PAIN, Disp: 60 tablet, Rfl: 1  Allergies:  Allergies  Allergen Reactions  . Other Other (See Comments)    Patient has hemorrhaged at least 4 times in her lifetime  . Cefuroxime Axetil Nausea Only    Upset stomach   . Ciprofloxacin Nausea Only    Upset stomach    Past Medical History, Surgical history, Social history, and Family History were reviewed and updated.  Review of Systems: Review of  Systems  Constitutional: Positive for fatigue.  HENT:  Negative.   Eyes: Negative.   Respiratory: Negative.   Cardiovascular: Negative.   Gastrointestinal: Negative.   Endocrine: Negative.   Genitourinary: Negative.    Musculoskeletal: Positive for arthralgias and back pain.  Neurological: Negative.   Hematological: Bruises/bleeds easily.  Psychiatric/Behavioral: Negative.     Physical Exam:  height is 5' 3.5" (1.613 m) and weight is 207 lb (93.9 kg). Her oral temperature is 98.2 F (36.8 C). Her blood pressure is 133/51 (abnormal) and her pulse is 61. Her respiration is 18 and oxygen saturation is 95%.   Wt Readings from Last 3 Encounters:  03/28/21 207 lb (93.9 kg)  02/04/21 201 lb (91.2 kg)  11/08/20 209 lb 9.6 oz (95.1 kg)    Physical Exam Vitals reviewed.  HENT:     Head: Normocephalic and atraumatic.  Eyes:     Pupils: Pupils are equal, round, and reactive to light.  Cardiovascular:     Rate and Rhythm: Normal rate and regular rhythm.     Heart sounds: Normal heart sounds.  Pulmonary:     Effort: Pulmonary effort is normal.     Breath sounds: Normal breath sounds.  Abdominal:     General: Bowel sounds are normal.     Palpations: Abdomen is soft.  Musculoskeletal:        General: No tenderness or deformity. Normal range of motion.     Cervical back: Normal range of motion.  Lymphadenopathy:     Cervical: No cervical adenopathy.  Skin:    General: Skin is warm and dry.     Findings: No erythema or rash.  Neurological:     Mental Status: She is alert and oriented to person, place, and time.  Psychiatric:        Behavior: Behavior normal.        Thought Content: Thought content normal.        Judgment: Judgment normal.      Lab Results  Component Value Date   WBC 7.6 03/28/2021   HGB 14.2 03/28/2021   HCT 42.2 03/28/2021   MCV 89.6 03/28/2021   PLT 128 (L) 03/28/2021     Chemistry      Component Value Date/Time   NA 142 03/28/2021 1338   NA 142  11/08/2020 1638   K 4.4 03/28/2021 1338   CL 104 03/28/2021 1338   CO2 29 03/28/2021 1338   BUN 45 (H) 03/28/2021 1338   BUN 34 (H) 11/08/2020 1638   CREATININE 1.41 (H) 03/28/2021 1338   CREATININE 1.06 (H) 11/30/2017 1559   GLU 136 12/03/2015 0000      Component Value Date/Time   CALCIUM  9.8 03/28/2021 1338   ALKPHOS 46 03/28/2021 1338   AST 19 03/28/2021 1338   ALT 19 03/28/2021 1338   BILITOT 0.6 03/28/2021 1338      Impression and Plan: Margaret Dyer is a very nice 86 year old white female.  She has NASH cirrhosis.  She has mild thrombocytopenia.  I looked at her blood smear under the microscope.  She certainly has large platelets.  This is a good sign that her bone marrow is functioning okay.  I really do not think that we have to do anything with her.  I do not see that she has any obvious bone marrow malignancy.  She will always have fluctuating thrombocytopenia.  Again she is on multiple medications.  These will interact I think will increase her risk of bruising.  Again I do think it would be a good idea to get an ultrasound of her abdomen so we can see how her spleen looks.  I would like to get her back in about 3 months so we see how everything looks.  It was nice to see her again.  We last saw her about 3 years ago.   Volanda Napoleon, MD 4/25/20224:42 PM

## 2021-03-29 ENCOUNTER — Telehealth: Payer: Self-pay

## 2021-03-29 ENCOUNTER — Encounter (HOSPITAL_COMMUNITY): Payer: Self-pay

## 2021-03-29 ENCOUNTER — Emergency Department (HOSPITAL_COMMUNITY)
Admission: EM | Admit: 2021-03-29 | Discharge: 2021-03-30 | Disposition: A | Discharge status: Home / Self Care | Payer: Medicare Other | Attending: Emergency Medicine | Admitting: Emergency Medicine

## 2021-03-29 ENCOUNTER — Emergency Department (HOSPITAL_COMMUNITY): Payer: Medicare Other

## 2021-03-29 ENCOUNTER — Telehealth: Payer: Self-pay | Admitting: Physician Assistant

## 2021-03-29 DIAGNOSIS — Z8616 Personal history of COVID-19: Secondary | ICD-10-CM | POA: Insufficient documentation

## 2021-03-29 DIAGNOSIS — R29810 Facial weakness: Secondary | ICD-10-CM | POA: Diagnosis not present

## 2021-03-29 DIAGNOSIS — E1122 Type 2 diabetes mellitus with diabetic chronic kidney disease: Secondary | ICD-10-CM | POA: Diagnosis not present

## 2021-03-29 DIAGNOSIS — Z7982 Long term (current) use of aspirin: Secondary | ICD-10-CM | POA: Insufficient documentation

## 2021-03-29 DIAGNOSIS — I5032 Chronic diastolic (congestive) heart failure: Secondary | ICD-10-CM | POA: Insufficient documentation

## 2021-03-29 DIAGNOSIS — J45909 Unspecified asthma, uncomplicated: Secondary | ICD-10-CM | POA: Diagnosis not present

## 2021-03-29 DIAGNOSIS — I10 Essential (primary) hypertension: Secondary | ICD-10-CM | POA: Diagnosis not present

## 2021-03-29 DIAGNOSIS — Z794 Long term (current) use of insulin: Secondary | ICD-10-CM | POA: Diagnosis not present

## 2021-03-29 DIAGNOSIS — Z87891 Personal history of nicotine dependence: Secondary | ICD-10-CM | POA: Diagnosis not present

## 2021-03-29 DIAGNOSIS — Z79899 Other long term (current) drug therapy: Secondary | ICD-10-CM | POA: Insufficient documentation

## 2021-03-29 DIAGNOSIS — G319 Degenerative disease of nervous system, unspecified: Secondary | ICD-10-CM | POA: Diagnosis not present

## 2021-03-29 DIAGNOSIS — H9193 Unspecified hearing loss, bilateral: Secondary | ICD-10-CM

## 2021-03-29 DIAGNOSIS — R519 Headache, unspecified: Secondary | ICD-10-CM | POA: Diagnosis not present

## 2021-03-29 DIAGNOSIS — R42 Dizziness and giddiness: Secondary | ICD-10-CM | POA: Diagnosis not present

## 2021-03-29 DIAGNOSIS — Z85528 Personal history of other malignant neoplasm of kidney: Secondary | ICD-10-CM | POA: Diagnosis not present

## 2021-03-29 DIAGNOSIS — N183 Chronic kidney disease, stage 3 unspecified: Secondary | ICD-10-CM | POA: Diagnosis not present

## 2021-03-29 DIAGNOSIS — I130 Hypertensive heart and chronic kidney disease with heart failure and stage 1 through stage 4 chronic kidney disease, or unspecified chronic kidney disease: Secondary | ICD-10-CM | POA: Insufficient documentation

## 2021-03-29 DIAGNOSIS — R531 Weakness: Secondary | ICD-10-CM | POA: Diagnosis not present

## 2021-03-29 DIAGNOSIS — E039 Hypothyroidism, unspecified: Secondary | ICD-10-CM | POA: Insufficient documentation

## 2021-03-29 LAB — CBC WITH DIFFERENTIAL/PLATELET
Abs Immature Granulocytes: 0.03 10*3/uL (ref 0.00–0.07)
Basophils Absolute: 0 10*3/uL (ref 0.0–0.1)
Basophils Relative: 1 %
Eosinophils Absolute: 0.1 10*3/uL (ref 0.0–0.5)
Eosinophils Relative: 2 %
HCT: 40.1 % (ref 36.0–46.0)
Hemoglobin: 13.2 g/dL (ref 12.0–15.0)
Immature Granulocytes: 0 %
Lymphocytes Relative: 15 %
Lymphs Abs: 1 10*3/uL (ref 0.7–4.0)
MCH: 30.3 pg (ref 26.0–34.0)
MCHC: 32.9 g/dL (ref 30.0–36.0)
MCV: 92 fL (ref 80.0–100.0)
Monocytes Absolute: 0.7 10*3/uL (ref 0.1–1.0)
Monocytes Relative: 9 %
Neutro Abs: 5 10*3/uL (ref 1.7–7.7)
Neutrophils Relative %: 73 %
Platelets: 105 10*3/uL — ABNORMAL LOW (ref 150–400)
RBC: 4.36 MIL/uL (ref 3.87–5.11)
RDW: 13 % (ref 11.5–15.5)
WBC: 6.9 10*3/uL (ref 4.0–10.5)
nRBC: 0 % (ref 0.0–0.2)

## 2021-03-29 LAB — BASIC METABOLIC PANEL
Anion gap: 8 (ref 5–15)
BUN: 37 mg/dL — ABNORMAL HIGH (ref 8–23)
CO2: 26 mmol/L (ref 22–32)
Calcium: 8.8 mg/dL — ABNORMAL LOW (ref 8.9–10.3)
Chloride: 104 mmol/L (ref 98–111)
Creatinine, Ser: 1.19 mg/dL — ABNORMAL HIGH (ref 0.44–1.00)
GFR, Estimated: 45 mL/min — ABNORMAL LOW (ref >60)
Glucose, Bld: 174 mg/dL — ABNORMAL HIGH (ref 70–99)
Potassium: 4.2 mmol/L (ref 3.5–5.1)
Sodium: 138 mmol/L (ref 135–145)

## 2021-03-29 LAB — TROPONIN I (HIGH SENSITIVITY): Troponin I (High Sensitivity): 8 ng/L (ref <18)

## 2021-03-29 MED ORDER — LOSARTAN POTASSIUM 50 MG PO TABS: 50.0000 mg | ORAL_TABLET | Freq: Every day | ORAL | Status: DC

## 2021-03-29 MED ORDER — DIPHENHYDRAMINE HCL 50 MG/ML IJ SOLN
12.5000 mg | Freq: Once | INTRAMUSCULAR | Status: AC
  Administered 2021-03-30: 12.5 mg via INTRAVENOUS
  Filled 2021-03-29: qty 1

## 2021-03-29 MED ORDER — KETOROLAC TROMETHAMINE 15 MG/ML IJ SOLN
15.0000 mg | Freq: Once | INTRAMUSCULAR | Status: AC
  Administered 2021-03-30: 15 mg via INTRAVENOUS
  Filled 2021-03-29: qty 1

## 2021-03-29 MED ORDER — METOCLOPRAMIDE HCL 5 MG/ML IJ SOLN
5.0000 mg | Freq: Once | INTRAMUSCULAR | Status: AC
  Administered 2021-03-30: 5 mg via INTRAVENOUS
  Filled 2021-03-29: qty 2

## 2021-03-29 MED ORDER — METOPROLOL TARTRATE 25 MG PO TABS: 25.0000 mg | ORAL_TABLET | Freq: Two times a day (BID) | ORAL | Status: DC

## 2021-03-29 NOTE — Telephone Encounter (Signed)
Called and left a vm with appts per 03/28/21 los   [employer]

## 2021-03-29 NOTE — ED Triage Notes (Signed)
Pt bibems from home. Pt has been having Hypertension for a few weeks and has been having her bp meds adjusted. She has been c/o  Dizziness/headache and foggy hearing that began a few hours ago. Pt took her bp and it was 212/100. She took her bp medications (hydralazine)  and called ems. When ems arrived pt bp was 162/80. Pt alert and oriented x4.

## 2021-03-29 NOTE — Telephone Encounter (Signed)
   Patient called earlier, family is answering the phone at this time because the fire department is there.  Ms. Gragert was having hypertension with blood pressures as high as 241/107.  She has been having a bad headache today.  She has been having some blurred vision.  Her hearing is affected as well.  EMS has been called and the fire department is on the scene at this time, EMS arrival pending.  I advised her family that she is having symptomatic hypertension, possibly hypertensive urgency or hypertensive emergency.  I recommended that she come to the hospital by EMS with further evaluation and treatment once she arrives there.  The patient and family are in agreement with this plan.  They request I let Dr. Debara Pickett know, I will forward him this note.  Rosaria Ferries, PA-C 03/29/2021 7:35 PM

## 2021-03-29 NOTE — ED Provider Notes (Signed)
[facility] EMERGENCY DEPARTMENT Provider Note   CSN: [id number] Arrival date & time: 03/29/21  2007     History Chief Complaint  Patient presents with  . Dizziness    Margaret Dyer is a 86 y.o. female with history of diabetes, obesity, hypertension, presented emergency department with headache and lightheadedness.  The patient reports her blood pressure been fluctuating wildly at home for the past several days or weeks.  Her cardiologist Dr. Debara Pickett has been helping work on adjusting her medications.  She currently takes amlodipine 5 mg daily, hydralazine 25 mg every 6-8 hours, Lasix 40 mg daily, spironolactone 12.5 mg daily, metoprolol 25 mg twice a day.  She has been compliant with all of his medicines.  She said she began having a frontal headache today and felt like her sense of hearing is diminished in the bilateral ears, "it feels like I'm listening from down in a well."  She describes a feeling of lightheadedness.  She reports her blood pressure was 212/100 mmhg at home today.  EMS called to scene and reported BP 162/80.  Patient denies any history of stroke.  She denies fevers, chills, cough, congestion.  She denies any chest pain or shortness of breath.  She is a history of renal cancer and nephrectomy.  She also has a history of thrombocytopenia secondary to NASH and follows with Dr Marin Olp from hematology at [facility].  She is not on chemo or radiation therapy.  HPI     Past Medical History:  Diagnosis Date  . Asthma   . Blood transfusion   . Chronic diastolic CHF (congestive heart failure) ([place])   . CKD (chronic kidney disease), stage III ([place])   . Coronary artery calcification seen on CT scan   . Diabetes mellitus   . Goiter   . Hyperlipidemia   . Hypertension   . Hypothyroidism   . Liver cirrhosis ([place])   . NASH (nonalcoholic steatohepatitis)   . Obesity   . Osteopenia   . Pulmonary hypertension ([place])   . Renal cell carcinoma    a. prior  nephrectomy.  . Right heart failure ([place])   . Thrombocytopenia [facility])     Patient Active Problem List   Diagnosis Date Noted  . Closed fracture of maxillary sinus ([place]) 07/10/2019  . UTI (urinary tract infection) 07/03/2019  . Food poisoning 07/03/2019  . Bacteremia 07/03/2019  . Exposure to COVID-19 virus 06/16/2019  . Closed left hip fracture ([place]) 12/24/2018  . Dysuria 12/17/2017  . Dizziness 12/17/2017  . Community acquired pneumonia 12/17/2017  . Pneumonia 12/01/2017  . Urinary incontinence 11/09/2017  . Tic 11/09/2017  . CKD (chronic kidney disease), stage III ([place]) 09/12/2017  . Pulmonary hypertension ([place]) 09/12/2017  . Chronic diastolic CHF (congestive heart failure) ([place]) 06/05/2017  . Oral candida 05/29/2017  . Vitamin D deficiency 05/29/2017  . Chronic respiratory failure ([place]) 05/24/2017  . Anxiety 05/24/2017  . HCAP (healthcare-associated pneumonia) 05/22/2017  . Right heart failure ([place]) 05/17/2017  . Acute diastolic CHF (congestive heart failure) ([place])   . Exertional dyspnea 05/13/2017  . Hypertension 05/13/2017  . Asthma 05/13/2017  . DOE (dyspnea on exertion) 05/13/2017  . Diabetes mellitus type 2, insulin dependent ([place]) 05/13/2017  . Acute kidney injury ([place]) 05/13/2017  . History of nephrectomy 05/13/2017  . Hyperlipidemia LDL goal <70 05/13/2017  . Liver cirrhosis secondary to NASH ([place]) 05/13/2017  . Thrombocytopenia ([place]) 05/13/2017  . Renal cell carcinoma 05/13/2017  . Hypothyroidism 05/13/2017  . Hypersplenism  05/13/2017  . Osteoarthritis 05/13/2017  . Overactive bladder 05/13/2017  . URI, acute 02/22/2017  . Abrasion, left knee, initial encounter 02/22/2017  . CTS (carpal tunnel syndrome) 03/04/2016  . Bilateral contusion of ribs 10/05/2015  . Dental abscess 04/15/2014  . Claudication, intermittent ([place]) 12/05/2013  . Obesity (BMI 30-39.9) 07/22/2013  . Back pain 05/26/2013  . Platelets decreased ([place]) 05/26/2013  . Breast pain, right  04/21/2013  . Anxiety and depression 12/05/2012  . Bronchitis 11/06/2012  . HEMATURIA UNSPECIFIED 02/17/2011  . Hyperlipidemia 09/08/2010  . B12 DEFICIENCY 07/22/2010  . DIZZINESS 07/04/2010  . Essential hypertension 06/21/2010  . GOUT, UNSPECIFIED 01/27/2010  . NEPHRECTOMY, HX OF 12/02/2009  . NECK PAIN, LEFT 09/04/2008  . PARESTHESIA 09/02/2007  . GOITER NOS 04/23/2007  . Diabetes mellitus ([place]) 04/23/2007  . Asthma 04/23/2007  . Renal stone 04/23/2007  . OSTEOPENIA 04/23/2007  . CARCINOMA, RENAL CELL 08/10/1999    Past Surgical History:  Procedure Laterality Date  . ABDOMINAL HYSTERECTOMY    . APPENDECTOMY    . HEMORRHOID SURGERY    . INTRAMEDULLARY (IM) NAIL INTERTROCHANTERIC Left 12/25/2018   Procedure: INTRAMEDULLARY (IM) NAIL INTERTROCHANTRIC;  Surgeon: Nicholes Stairs, MD;  Location: [place];  Service: Orthopedics;  Laterality: Left;  . KNEE SURGERY    . NEPHRECTOMY  09.2000     OB History   No obstetric history on file.     Family History  Problem Relation Age of Onset  . Cancer Sister        bladder  . Kidney cancer Father   . Cancer Father        renal  . COPD Father   . Congestive Heart Failure Father   . Coronary artery disease Other   . Diabetes Other   . Hyperlipidemia Other   . Hypertension Other   . Rheumatologic disease Neg Hx     Social History   Tobacco Use  . Smoking status: Former Research scientist (life sciences)  . Smokeless tobacco: Never Used  . Tobacco comment: 05/30/18 none in 40 years  Vaping Use  . Vaping Use: Never used  Substance Use Topics  . Alcohol use: No    Alcohol/week: 0.0 standard drinks  . Drug use: No    Home Medications Prior to Admission medications   Medication Sig Start Date End Date Taking? Authorizing Provider  amLODipine (NORVASC) 5 MG tablet Take 1 tablet (5 mg total) by mouth daily. 02/10/21 05/11/21  Pixie Casino, MD  aspirin EC 81 MG EC tablet Take 1 tablet (81 mg total) by mouth daily. 07/07/19   Geradine Girt, DO   docusate sodium (COLACE) 100 MG capsule Take 1 capsule (100 mg total) by mouth 2 (two) times daily. 12/30/18   Bonnell Public, MD  ezetimibe-simvastatin (VYTORIN) 10-40 MG tablet Take 1 tablet by mouth daily.    [provider]  fluconazole (DIFLUCAN) 150 MG tablet Take 1 tablet (150 mg total) by mouth once as needed for up to 1 dose (yeast infection). 07/06/19   Geradine Girt, DO  furosemide (LASIX) 40 MG tablet Take 1 tablet (40 mg total) by mouth daily. 11/08/20   Hilty, Nadean Corwin, MD  hydrALAZINE (APRESOLINE) 25 MG tablet Take 1 tablet (25 mg total) by mouth every 8 (eight) hours. 02/10/21   Hilty, Nadean Corwin, MD  insulin glargine (LANTUS) 100 UNIT/ML injection Inject 0.25 mLs (25 Units total) into the skin at bedtime. Patient taking differently: Inject 30 Units into the skin at bedtime.  07/06/19  Geradine Girt, DO  Insulin Glulisine (APIDRA SOLOSTAR) 100 UNIT/ML Solostar Pen Inject 10-15 Units into the skin 3 (three) times daily before meals.     [provider]  levothyroxine (SYNTHROID, LEVOTHROID) 25 MCG tablet take 1 tablet by mouth daily 07/13/17   Carollee Herter, Alferd Apa, DO  losartan (COZAAR) 50 MG tablet Take 1 tablet (50 mg total) by mouth daily. 11/08/20   Hilty, Nadean Corwin, MD  metoprolol tartrate (LOPRESSOR) 25 MG tablet Take 1 tablet (25 mg total) by mouth 2 (two) times daily. 11/08/20   Hilty, Nadean Corwin, MD  Multiple Vitamins-Minerals (CENTRUM SILVER ULTRA WOMENS PO) Take 1 tablet by mouth at bedtime.     [provider]  oxybutynin (DITROPAN) 5 MG tablet Take 1 tablet (5 mg total) by mouth daily. 10/06/20   Ann Held, DO  potassium chloride SA (KLOR-CON) 20 MEQ tablet TAKE 1 TABLET BY MOUTH DAILY AT BEDTIME 02/24/21   Pixie Casino, MD  spironolactone (ALDACTONE) 25 MG tablet TAKE 1/2 TABLETS BY MOUTH DAILY 09/24/20   Duke, Tami Lin, PA  sucralfate (CARAFATE) 1 g tablet Take 1 tablet (1 g total) by mouth 4 (four) times daily -  with meals  and at bedtime. 12/02/19   Ann Held, DO  traMADol (ULTRAM) 50 MG tablet TAKE 1 TABLET BY MOUTH EVERY 6 HOURS AS NEEDED FOR MODERATE PAIN 12/27/20   Ann Held, DO    Allergies    Other, Cefuroxime axetil, and Ciprofloxacin  Review of Systems   Review of Systems  Constitutional: Negative for chills and fever.  HENT: Positive for hearing loss. Negative for sore throat.   Eyes: Positive for visual disturbance. Negative for pain.  Respiratory: Negative for cough and shortness of breath.   Cardiovascular: Negative for chest pain and palpitations.  Gastrointestinal: Negative for abdominal pain and vomiting.  Genitourinary: Negative for dysuria and hematuria.  Musculoskeletal: Negative for arthralgias and back pain.  Skin: Negative for color change and rash.  Neurological: Positive for dizziness, light-headedness and headaches. Negative for syncope and speech difficulty.  Psychiatric/Behavioral: Negative for agitation and confusion.  All other systems reviewed and are negative.   Physical Exam Updated Vital Signs BP (!) 136/43 (BP Location: Left Arm)   Pulse 69   Resp 16   LMP  (LMP Unknown)   SpO2 98%   Physical Exam Constitutional:      General: She is not in acute distress. HENT:     Head: Normocephalic and atraumatic.     Comments: Diminished hearing bilaterally    Right Ear: Tympanic membrane and ear canal normal. There is no impacted cerumen.     Left Ear: Tympanic membrane and ear canal normal. There is no impacted cerumen.  Eyes:     Conjunctiva/sclera: Conjunctivae normal.     Pupils: Pupils are equal, round, and reactive to light.  Cardiovascular:     Rate and Rhythm: Normal rate and regular rhythm.     Pulses: Normal pulses.  Pulmonary:     Effort: Pulmonary effort is normal. No respiratory distress.  Abdominal:     General: There is no distension.     Tenderness: There is no abdominal tenderness.  Skin:    General: Skin is warm and dry.   Neurological:     General: No focal deficit present.     Mental Status: She is alert and oriented to person, place, and time. Mental status is at baseline.  Sensory: No sensory deficit.     Motor: No weakness.  Psychiatric:        Mood and Affect: Mood normal.        Behavior: Behavior normal.     ED Results / Procedures / Treatments   Labs (all labs ordered are listed, but only abnormal results are displayed) Labs Reviewed - No data to display  EKG None  Radiology No results found.  Procedures Procedures   Medications Ordered in ED Medications - No data to display  ED Course  I have reviewed the triage vital signs and the nursing notes.  Pertinent labs & imaging results that were available during my care of the patient were reviewed by me and considered in my medical decision making (see chart for details).  This patient complains of labile blood pressures, headache, dizziness, hearing loss.  This involves an extensive number of treatment options, and is a complaint that carries with it a high risk of complications and morbidity.  The differential diagnosis includes hypertensive urgency vs infection vs CVA/ICH vs medication side effect vs other  BP 136/70 on my exam.  TM and external ear canals normal - no evidence of AOM, middle ear infection, or cerumen impaction.  Hearing loss is peculiar - may be a symptom of PRES.  Less likely stroke but cannot exclude possibility.  We'll obtain an MRI brain.  I ordered, reviewed, and interpreted labs.  Platelets near baseline at 105.  BMP largely unremarkable.  Electrolytes normal.  Troponin 8.  Doubt ACS. I ordered medications as noted below for headache, HTN I ordered imaging studies which included CTH, MRI brain  Previous records obtained and reviewed showing recent outpatient labs per hematology, with yesterday lows showing Cr 1.41, Bun 45, Na and K normal, Platelets 128, WBC 7.6, Hgb 14.2.  *  If workup today is  unremarkable, she will need to follow up with her cardiologist tomorrow about her BP control.  I wonder if her home blood pressure cuff is accurate, though she tells me when the paramedics came out last time, they verified that it was accurate compared to their blood pressure.  She appears to be having some labile swings with her blood pressure.   Clinical Course as of 03/30/21 0014  Tue Mar 29, 2021  2310 ECG obtained, did not cross over, photo added to media tab.  This shows NSR with no acute ischemic findings per my interpretation.  HR 62, Qtc 450, good R wave progression. [MT]  Wed Mar 30, 2021  0013 Pt signed out to Dr Sedonia Small pending MRI brain to evaluate for evidence of PRES.  I've ordered her usual evening Bp meds including metoprolol and cozaar, but I'm holding her hydralazine 25 mg dose as her BP is low-normal here. [MT]    Clinical Course User Index [MT] Taheera Thomann, Carola Rhine, MD    Final Clinical Impression(s) / ED Diagnoses Final diagnoses:  None    Rx / DC Orders ED Discharge Orders    None       Wyvonnia Dusky, MD 03/30/21 1035

## 2021-03-30 ENCOUNTER — Emergency Department (HOSPITAL_COMMUNITY): Payer: Medicare Other

## 2021-03-30 ENCOUNTER — Telehealth: Payer: Self-pay | Admitting: Internal Medicine

## 2021-03-30 ENCOUNTER — Other Ambulatory Visit: Payer: Self-pay | Admitting: Family Medicine

## 2021-03-30 DIAGNOSIS — R32 Unspecified urinary incontinence: Secondary | ICD-10-CM

## 2021-03-30 DIAGNOSIS — R519 Headache, unspecified: Secondary | ICD-10-CM | POA: Diagnosis not present

## 2021-03-30 DIAGNOSIS — G319 Degenerative disease of nervous system, unspecified: Secondary | ICD-10-CM | POA: Diagnosis not present

## 2021-03-30 DIAGNOSIS — I959 Hypotension, unspecified: Secondary | ICD-10-CM | POA: Diagnosis not present

## 2021-03-30 DIAGNOSIS — R279 Unspecified lack of coordination: Secondary | ICD-10-CM | POA: Diagnosis not present

## 2021-03-30 DIAGNOSIS — I130 Hypertensive heart and chronic kidney disease with heart failure and stage 1 through stage 4 chronic kidney disease, or unspecified chronic kidney disease: Secondary | ICD-10-CM | POA: Diagnosis not present

## 2021-03-30 DIAGNOSIS — Z743 Need for continuous supervision: Secondary | ICD-10-CM | POA: Diagnosis not present

## 2021-03-30 NOTE — ED Notes (Signed)
[phone] daughter [name] would like an update

## 2021-03-30 NOTE — ED Notes (Signed)
Patient verbalizes understanding of discharge instructions. Opportunity for questioning and answers were provided. Armband removed by staff, pt discharged from ED via [facility] to go home.

## 2021-03-30 NOTE — Telephone Encounter (Signed)
Patient called to say that she went to the ER last night due to her bp. ER dr want her to to let Dr. Debara Pickett know what was going on. Please advise  Pt c/o BP issue: STAT if pt c/o blurred vision, one-sided weakness or slurred speech  1. What are your last 5 BP readings?  4/22 174/81  4/23 155/88 4/24 178/77 4/25 173/89 4/26 205/112 (when it went this high patient head was hurting so bad and got so dizzy patient thought she was having a stroke) 4/27 172/83  2. Are you having any other symptoms (ex. Dizziness, headache, blurred vision, passed out)?d dizziness, headache  3. What is your BP issue? Patient states once she states her medicine it would go down for bout 4/5 hrs then it goes right back up again.

## 2021-03-30 NOTE — Telephone Encounter (Signed)
Returned call to patient left message to call back.

## 2021-03-30 NOTE — Telephone Encounter (Signed)
Spoke to patient Dr.Hilty's advice given.Stated she has a lot of Hydralazine 25 mg tablets, she will take 2 tablets ( 50 mg ) every 8 hours until she sees [employer] PA next week.She will monitor B/P and bring readings to appointment.

## 2021-03-30 NOTE — Telephone Encounter (Signed)
Spoke to patient she stated her B/P has been up and down.Readings listed below.Pulse 60 to 70.Stated she went to ED yesterday and was advised to schedule appointment.Appointment scheduled with Almyra Deforest PA 5/6 at 3:45 pm.She wanted to ask Dr.Hilty to increase one of her B/P meds until appointment with [facility].Message sent to Dr.Hilty for advice.

## 2021-03-30 NOTE — Telephone Encounter (Signed)
Went to the ER with elevated BP, please arrange ER follow-up for next week. If she reports that her BP is quite elevated or has symptoms arrange a sooner appointment.

## 2021-03-30 NOTE — Discharge Instructions (Signed)
Please call your heart doctor's office tomorrow to talk about your blood pressure.  You may be having symptoms your blood pressure swinging too high and too low.  You should talk to your doctors office to come up with a plan for your medications.

## 2021-03-30 NOTE — Telephone Encounter (Signed)
Yes .. I'm aware of the ER visit. Would recommend increasing hydralazine to 50 mg q8hrs until seen by Isaac Laud. Monitor BP.  Dr Lemmie Evens

## 2021-03-30 NOTE — ED Provider Notes (Signed)
  Provider Note MRN:  [id number]  Arrival date & time: 03/30/21    ED Course and Medical Decision Making  Assumed care from Dr. Langston Masker At shift change.  Hypertension with bilateral hearing loss, awaiting MRI to evaluate for press.  MRI normal, appropriate for dc.  Procedures  Final Clinical Impressions(s) / ED Diagnoses     ICD-10-CM   1. Hypertension, unspecified type  I10   2. Decreased hearing of both ears  H91.93     ED Discharge Orders    None        Discharge Instructions     Please call your heart doctor's office tomorrow to talk about your blood pressure.  You may be having symptoms your blood pressure swinging too high and too low.  You should talk to your doctors office to come up with a plan for your medications.     Barth Kirks. Sedonia Small, [place] [email or web address] .Felton Clinton, MD 03/30/21 [phone]

## 2021-03-30 NOTE — ED Notes (Signed)
Patient transported to MRI 

## 2021-03-30 NOTE — ED Notes (Signed)
[facility] called to transport patient home

## 2021-03-31 NOTE — Telephone Encounter (Signed)
Left vm to schedule ER follow up.

## 2021-03-31 NOTE — Telephone Encounter (Signed)
FYI:

## 2021-03-31 NOTE — Telephone Encounter (Signed)
Patient called back in reference to scheduling an appointment for ED follow up.  Patient can not schedule appointment at this time, she does not have a ride , also she is seeing heart doctor on May 6,2022

## 2021-03-31 NOTE — Telephone Encounter (Signed)
Noted

## 2021-04-02 DIAGNOSIS — Z20822 Contact with and (suspected) exposure to covid-19: Secondary | ICD-10-CM | POA: Diagnosis not present

## 2021-04-08 ENCOUNTER — Ambulatory Visit (INDEPENDENT_AMBULATORY_CARE_PROVIDER_SITE_OTHER): Payer: Medicare Other | Admitting: Physician Assistant

## 2021-04-08 ENCOUNTER — Other Ambulatory Visit: Payer: Self-pay

## 2021-04-08 ENCOUNTER — Telehealth: Payer: Self-pay | Admitting: Family Medicine

## 2021-04-08 ENCOUNTER — Other Ambulatory Visit: Payer: Self-pay | Admitting: Family Medicine

## 2021-04-08 ENCOUNTER — Encounter: Payer: Self-pay | Admitting: Physician Assistant

## 2021-04-08 VITALS — BP 128/74 | HR 65 | Ht 63.0 in | Wt 207.0 lb

## 2021-04-08 DIAGNOSIS — H9192 Unspecified hearing loss, left ear: Secondary | ICD-10-CM

## 2021-04-08 DIAGNOSIS — E785 Hyperlipidemia, unspecified: Secondary | ICD-10-CM | POA: Diagnosis not present

## 2021-04-08 DIAGNOSIS — R32 Unspecified urinary incontinence: Secondary | ICD-10-CM

## 2021-04-08 DIAGNOSIS — I10 Essential (primary) hypertension: Secondary | ICD-10-CM | POA: Diagnosis not present

## 2021-04-08 DIAGNOSIS — E119 Type 2 diabetes mellitus without complications: Secondary | ICD-10-CM

## 2021-04-08 DIAGNOSIS — D696 Thrombocytopenia, unspecified: Secondary | ICD-10-CM | POA: Diagnosis not present

## 2021-04-08 DIAGNOSIS — Z905 Acquired absence of kidney: Secondary | ICD-10-CM | POA: Diagnosis not present

## 2021-04-08 MED ORDER — AMLODIPINE BESYLATE 5 MG PO TABS: 5.0000 mg | ORAL_TABLET | Freq: Two times a day (BID) | ORAL | 1 refills | Status: DC

## 2021-04-08 NOTE — Patient Instructions (Signed)
Medication Instructions:  Take amlodipine twice a day, take the second dose before bed per Almyra Deforest, PA.   *If you need a refill on your cardiac medications before your next appointment, please call your pharmacy*   Lab Work: None ordered.    Testing/Procedures: None ordered.    Follow-Up: At [facility], you and your health needs are our priority.  As part of our continuing mission to provide you with exceptional heart care, we have created designated Provider Care Teams.  These Care Teams include your primary Cardiologist (physician) and Advanced Practice Providers (APPs -  Physician Assistants and Nurse Practitioners) who all work together to provide you with the care you need, when you need it.  We recommend signing up for the patient portal called "MyChart".  Sign up information is provided on this After Visit Summary.  MyChart is used to connect with patients for Virtual Visits (Telemedicine).  Patients are able to view lab/test results, encounter notes, upcoming appointments, etc.  Non-urgent messages can be sent to your provider as well.   To learn more about what you can do with MyChart, go to [email or web address].    Your next appointment:   1 month(s)  The format for your next appointment:   In Person  Provider:   K. Mali Hilty, MD or Almyra Deforest, [place]   Other Instructions Keep a record of your blood pressures at home, blood pressure should be taken twice daily. Take your blood pressure once two hours after your blood pressure medications. Take it a second time at a fixed time every evening.  Please bring your blood pressure cuff to your next appointment.   You have a referral for an ENT visit, the ENT office will contact you to schedule.

## 2021-04-08 NOTE — Telephone Encounter (Signed)
Refill sent

## 2021-04-08 NOTE — Telephone Encounter (Signed)
Caller Olive Zmuda  Call Back @ [phone]  Patient would like a few pills to hold her until her appointment on Monday   oxybutynin (DITROPAN) 5 MG tablet [[id number]]
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
## 2021-04-10 ENCOUNTER — Encounter: Payer: Self-pay | Admitting: Physician Assistant

## 2021-04-11 ENCOUNTER — Other Ambulatory Visit: Payer: Self-pay

## 2021-04-11 ENCOUNTER — Telehealth: Payer: Medicare Other | Admitting: Family Medicine

## 2021-04-11 DIAGNOSIS — L02214 Cutaneous abscess of groin: Secondary | ICD-10-CM | POA: Diagnosis not present

## 2021-05-18 ENCOUNTER — Encounter: Payer: Self-pay | Admitting: Emergency Medicine

## 2021-05-18 ENCOUNTER — Ambulatory Visit
Admission: EM | Admit: 2021-05-18 | Discharge: 2021-05-18 | Disposition: A | Discharge status: Home / Self Care | Payer: Medicare Other | Attending: Emergency Medicine | Admitting: Emergency Medicine

## 2021-05-18 ENCOUNTER — Other Ambulatory Visit: Payer: Self-pay

## 2021-05-18 DIAGNOSIS — N390 Urinary tract infection, site not specified: Secondary | ICD-10-CM | POA: Diagnosis not present

## 2021-05-18 LAB — POCT URINALYSIS DIP (MANUAL ENTRY)
Bilirubin, UA: NEGATIVE
Blood, UA: NEGATIVE
Glucose, UA: NEGATIVE mg/dL
Ketones, POC UA: NEGATIVE mg/dL
Nitrite, UA: POSITIVE — AB
Protein Ur, POC: NEGATIVE mg/dL
Spec Grav, UA: 1.015 (ref 1.010–1.025)
Urobilinogen, UA: 0.2 E.U./dL
pH, UA: 6.5 (ref 5.0–8.0)

## 2021-05-18 MED ORDER — SULFAMETHOXAZOLE-TRIMETHOPRIM 800-160 MG PO TABS: 1.0000 | ORAL_TABLET | Freq: Two times a day (BID) | ORAL | 0 refills | Status: AC

## 2021-05-18 NOTE — Discharge Instructions (Addendum)
Urine showed evidence of infection. We are treating you with bactrim- twice daily for 7 days. Be sure to take full course. Stay hydrated- urine should be pale yellow to clear.   Please return or follow up with your primary provider if symptoms not improving with treatment. Please return sooner if you have worsening of symptoms or develop fever, nausea, vomiting, abdominal pain, back pain, lightheadedness, dizziness.

## 2021-05-18 NOTE — ED Provider Notes (Signed)
[facility]    CSN: [id number] Arrival date & time: 05/18/21  1755      History   Chief Complaint Chief Complaint  Patient presents with   Dysuria    HPI Margaret Dyer is a 86 y.o. female history of chronic diastolic CHF, CKD, DM type II, hypertension, presenting today for evaluation of possible UTI.  Reports history of recurrent UTIs.  Reports over the past 1 to 2 days has developed dysuria, urinary frequency urgency and slight lower abdominal and back discomfort.  Denies fevers nausea or vomiting.  HPI  Past Medical History:  Diagnosis Date   Asthma    Blood transfusion    Chronic diastolic CHF (congestive heart failure) (HCC)    CKD (chronic kidney disease), stage III (HCC)    Coronary artery calcification seen on CT scan    Diabetes mellitus    Goiter    Hyperlipidemia    Hypertension    Hypothyroidism    Liver cirrhosis (HCC)    NASH (nonalcoholic steatohepatitis)    Obesity    Osteopenia    Pulmonary hypertension (HCC)    Renal cell carcinoma    a. prior nephrectomy.   Right heart failure (HCC)    Thrombocytopenia [facility])     Patient Active Problem List   Diagnosis Date Noted   Closed fracture of maxillary sinus ([place]) 07/10/2019   UTI (urinary tract infection) 07/03/2019   Food poisoning 07/03/2019   Bacteremia 07/03/2019   Exposure to COVID-19 virus 06/16/2019   Closed left hip fracture ([place]) 12/24/2018   Dysuria 12/17/2017   Dizziness 12/17/2017   Community acquired pneumonia 12/17/2017   Pneumonia 12/01/2017   Urinary incontinence 11/09/2017   Tic 11/09/2017   CKD (chronic kidney disease), stage III ([place]) 09/12/2017   Pulmonary hypertension ([place]) 09/12/2017   Chronic diastolic CHF (congestive heart failure) ([place]) 06/05/2017   Oral candida 05/29/2017   Vitamin D deficiency 05/29/2017   Chronic respiratory failure ([place]) 05/24/2017   Anxiety 05/24/2017   HCAP (healthcare-associated pneumonia) 05/22/2017   Right heart failure ([place])  29/56/2130   Acute diastolic CHF (congestive heart failure) (HCC)    Exertional dyspnea 05/13/2017   Hypertension 05/13/2017   Asthma 05/13/2017   DOE (dyspnea on exertion) 05/13/2017   Diabetes mellitus type 2, insulin dependent ([place]) 05/13/2017   Acute kidney injury ([place]) 05/13/2017   History of nephrectomy 05/13/2017   Hyperlipidemia LDL goal <70 05/13/2017   Liver cirrhosis secondary to NASH ([place]) 05/13/2017   Thrombocytopenia ([place]) 05/13/2017   Renal cell carcinoma 05/13/2017   Hypothyroidism 05/13/2017   Hypersplenism 05/13/2017   Osteoarthritis 05/13/2017   Overactive bladder 05/13/2017   URI, acute 02/22/2017   Abrasion, left knee, initial encounter 02/22/2017   CTS (carpal tunnel syndrome) 03/04/2016   Bilateral contusion of ribs 10/05/2015   Dental abscess 04/15/2014   Claudication, intermittent ([place]) 12/05/2013   Obesity (BMI 30-39.9) 07/22/2013   Back pain 05/26/2013   Platelets decreased ([place]) 05/26/2013   Breast pain, right 04/21/2013   Anxiety and depression 12/05/2012   Bronchitis 11/06/2012   HEMATURIA UNSPECIFIED 02/17/2011   Hyperlipidemia 09/08/2010   B12 DEFICIENCY 07/22/2010   DIZZINESS 07/04/2010   Essential hypertension 06/21/2010   GOUT, UNSPECIFIED 01/27/2010   NEPHRECTOMY, HX OF 12/02/2009   NECK PAIN, LEFT 09/04/2008   PARESTHESIA 09/02/2007   GOITER NOS 04/23/2007   Diabetes mellitus ([place]) 04/23/2007   Asthma 04/23/2007   Renal stone 04/23/2007   OSTEOPENIA 04/23/2007   CARCINOMA, RENAL CELL 08/10/1999  Past Surgical History:  Procedure Laterality Date   ABDOMINAL HYSTERECTOMY     APPENDECTOMY     HEMORRHOID SURGERY     INTRAMEDULLARY (IM) NAIL INTERTROCHANTERIC Left 12/25/2018   Procedure: INTRAMEDULLARY (IM) NAIL INTERTROCHANTRIC;  Surgeon: Nicholes Stairs, MD;  Location: [facility];  Service: Orthopedics;  Laterality: Left;   KNEE SURGERY     NEPHRECTOMY  09.2000    OB History   No obstetric history on file.      Home  Medications    Prior to Admission medications   Medication Sig Start Date End Date Taking? Authorizing Provider  sulfamethoxazole-trimethoprim (BACTRIM DS) 800-160 MG tablet Take 1 tablet by mouth 2 (two) times daily for 7 days. 05/18/21 05/25/21 Yes Gardy Montanari C, PA-C  amLODipine (NORVASC) 5 MG tablet Take 1 tablet (5 mg total) by mouth 2 (two) times daily. Take second dose before bed. 04/08/21   Almyra Deforest, PA  aspirin EC 81 MG EC tablet Take 1 tablet (81 mg total) by mouth daily. 07/07/19   Geradine Girt, DO  docusate sodium (COLACE) 100 MG capsule Take 1 capsule (100 mg total) by mouth 2 (two) times daily. 12/30/18   Bonnell Public, MD  ezetimibe-simvastatin (VYTORIN) 10-40 MG tablet Take 1 tablet by mouth daily.    [provider]  furosemide (LASIX) 40 MG tablet Take 1 tablet (40 mg total) by mouth daily. 11/08/20   Hilty, Nadean Corwin, MD  hydrALAZINE (APRESOLINE) 25 MG tablet Take 2 tablet ( 50 mg ) every 8 hours 03/30/21   Hilty, Nadean Corwin, MD  insulin glargine (LANTUS) 100 UNIT/ML injection Inject 0.25 mLs (25 Units total) into the skin at bedtime. Patient taking differently: Inject 30 Units into the skin at bedtime. 07/06/19   Geradine Girt, DO  Insulin Glulisine (APIDRA SOLOSTAR) 100 UNIT/ML Solostar Pen Inject 10-15 Units into the skin 3 (three) times daily before meals.     [provider]  levothyroxine (SYNTHROID, LEVOTHROID) 25 MCG tablet take 1 tablet by mouth daily 07/13/17   Carollee Herter, Alferd Apa, DO  losartan (COZAAR) 50 MG tablet Take 1 tablet (50 mg total) by mouth daily. 11/08/20   Hilty, Nadean Corwin, MD  metoprolol tartrate (LOPRESSOR) 25 MG tablet Take 1 tablet (25 mg total) by mouth 2 (two) times daily. 11/08/20   Hilty, Nadean Corwin, MD  Multiple Vitamins-Minerals (CENTRUM SILVER ULTRA WOMENS PO) Take 1 tablet by mouth at bedtime.     [provider]  oxybutynin (DITROPAN) 5 MG tablet TAKE 1 TABLET BY MOUTH DAILY 04/08/21   Carollee Herter, Alferd Apa, DO   potassium chloride SA (KLOR-CON) 20 MEQ tablet TAKE 1 TABLET BY MOUTH DAILY AT BEDTIME 02/24/21   Hilty, Nadean Corwin, MD  spironolactone (ALDACTONE) 25 MG tablet TAKE 1/2 TABLETS BY MOUTH DAILY 09/24/20   Duke, Tami Lin, PA  sucralfate (CARAFATE) 1 g tablet Take 1 tablet (1 g total) by mouth 4 (four) times daily -  with meals and at bedtime. 12/02/19   Ann Held, DO  traMADol (ULTRAM) 50 MG tablet TAKE 1 TABLET BY MOUTH EVERY 6 HOURS AS NEEDED FOR MODERATE PAIN 12/27/20   Ann Held, DO    Family History Family History  Problem Relation Age of Onset   Cancer Sister        bladder   Kidney cancer Father    Cancer Father        renal   COPD Father    Congestive  Heart Failure Father    Coronary artery disease Other    Diabetes Other    Hyperlipidemia Other    Hypertension Other    Rheumatologic disease Neg Hx     Social History Social History   Tobacco Use   Smoking status: Former    Pack years: 0.00   Smokeless tobacco: Never   Tobacco comments:    05/30/18 none in 40 years  Vaping Use   Vaping Use: Never used  Substance Use Topics   Alcohol use: No    Alcohol/week: 0.0 standard drinks   Drug use: No     Allergies   Other, Cefuroxime axetil, and Ciprofloxacin   Review of Systems Review of Systems  Constitutional:  Negative for fever.  Respiratory:  Negative for shortness of breath.   Cardiovascular:  Negative for chest pain.  Gastrointestinal:  Negative for abdominal pain, diarrhea, nausea and vomiting.  Genitourinary:  Positive for dysuria, frequency and urgency. Negative for flank pain, genital sores, hematuria, menstrual problem, vaginal bleeding, vaginal discharge and vaginal pain.  Musculoskeletal:  Negative for back pain.  Skin:  Negative for rash.  Neurological:  Negative for dizziness, light-headedness and headaches.    Physical Exam Triage Vital Signs ED Triage Vitals  Enc Vitals Group     BP 05/18/21 1925 132/63     Pulse  Rate 05/18/21 1925 68     Resp 05/18/21 1925 18     Temp 05/18/21 1925 98.1 F (36.7 C)     Temp Source 05/18/21 1925 Oral     SpO2 05/18/21 1925 90 %     Weight --      Height --      Head Circumference --      Peak Flow --      Pain Score 05/18/21 1926 2     Pain Loc --      Pain Edu? --      Excl. in [place]? --    No data found.  Updated Vital Signs BP 132/63 (BP Location: Left Arm)   Pulse 68   Temp 98.1 F (36.7 C) (Oral)   Resp 18   LMP  (LMP Unknown)   SpO2 90% Comment: normally on O2 at home  Visual Acuity Right Eye Distance:   Left Eye Distance:   Bilateral Distance:    Right Eye Near:   Left Eye Near:    Bilateral Near:     Physical Exam Vitals and nursing note reviewed.  Constitutional:      Appearance: She is well-developed.     Comments: No acute distress  HENT:     Head: Normocephalic and atraumatic.     Nose: Nose normal.  Eyes:     Conjunctiva/sclera: Conjunctivae normal.  Cardiovascular:     Rate and Rhythm: Normal rate.  Pulmonary:     Effort: Pulmonary effort is normal. No respiratory distress.  Abdominal:     General: There is no distension.  Musculoskeletal:        General: Normal range of motion.     Cervical back: Neck supple.  Skin:    General: Skin is warm and dry.  Neurological:     Mental Status: She is alert and oriented to person, place, and time.     UC Treatments / Results  Labs (all labs ordered are listed, but only abnormal results are displayed) Labs Reviewed  POCT URINALYSIS DIP (MANUAL ENTRY) - Abnormal; Notable for the following components:      Result Value  Clarity, UA cloudy (*)    Nitrite, UA Positive (*)    Leukocytes, UA Small (1+) (*)    All other components within normal limits  URINE CULTURE    EKG   Radiology No results found.  Procedures Procedures (including critical care time)  Medications Ordered in UC Medications - No data to display  Initial Impression / Assessment and Plan / UC  Course  I have reviewed the triage vital signs and the nursing notes.  Pertinent labs & imaging results that were available during my care of the patient were reviewed by me and considered in my medical decision making (see chart for details).     UA with small leuks, positive nitrites, urine culture pending for confirmation, empirically treating for UTI with Bactrim twice daily x1 week.  Discussed strict return precautions. Patient verbalized understanding and is agreeable with plan.  Final Clinical Impressions(s) / UC Diagnoses   Final diagnoses:  Lower urinary tract infection, acute     Discharge Instructions      Urine showed evidence of infection. We are treating you with bactrim- twice daily for 7 days. Be sure to take full course. Stay hydrated- urine should be pale yellow to clear.   Please return or follow up with your primary provider if symptoms not improving with treatment. Please return sooner if you have worsening of symptoms or develop fever, nausea, vomiting, abdominal pain, back pain, lightheadedness, dizziness.     ED Prescriptions     Medication Sig Dispense Auth. Provider   sulfamethoxazole-trimethoprim (BACTRIM DS) 800-160 MG tablet Take 1 tablet by mouth 2 (two) times daily for 7 days. 14 tablet Lionel Woodberry, [place] C, PA-C      PDMP not reviewed this encounter.   Janith Lima, [place] 05/18/21 2057

## 2021-05-18 NOTE — ED Triage Notes (Signed)
Pt sts UTI sx starting yesterday; pt sts she is on home O2 but did not bring with her

## 2021-05-22 LAB — URINE CULTURE: Culture: >=100000 — AB

## 2021-05-23 ENCOUNTER — Telehealth: Payer: Self-pay

## 2021-05-23 ENCOUNTER — Ambulatory Visit: Payer: Medicare Other | Admitting: Physician Assistant

## 2021-05-23 ENCOUNTER — Telehealth: Payer: Medicare Other | Admitting: Internal Medicine

## 2021-05-23 NOTE — Telephone Encounter (Signed)
Pt called stating over the past week she has been even more shorter in breath than normal.  She has turned up O2 machine to 2 3/4.  Pt wanted to make a virtual appt with PCP, but I advised pt based on symptoms I would need to transfer her to triage for further evaluation and from there we would let the nurse advise on what would need to happen as far as an appt, etc.  Pt agreed to transfer and was sent over for further evaluation.

## 2021-05-24 ENCOUNTER — Telehealth: Payer: Self-pay | Admitting: Internal Medicine

## 2021-05-24 NOTE — Telephone Encounter (Signed)
Advised pt to be seen at ER if sxs are worsening. Pt aware and voices understanding.  

## 2021-05-24 NOTE — Telephone Encounter (Signed)
Spoke with pt who report for about a week she's been experiencing SOB that's progressively getting worse. She state state she is very winded with exertion and daughter report pt can barely complete a full sentence. She also report swelling in ankle and mid section. Daughter report pt is normally on 2 L of O2 but has since increased to 2 1/2 and still SOB.    Dr. Debara Pickett (DOD) made aware and recommended pt increase lasix 40 mg BID x 3 days, track weight, and call back Friday with an update.   Pt verbalized understanding.

## 2021-05-24 NOTE — Telephone Encounter (Signed)
Nurse Assessment Nurse: Vallery Sa, RN, [place] Date/Time (Eastern Time): 05/23/2021 1:48:56 PM Confirm and document reason for call. If symptomatic, describe symptoms. ---Margaret Dyer states that she developed shortness of breath about a week ago ago (her current SpO2 is 92 ). No chest pain. She increased her nasal cannula oxygen from 2 to 2.75 L/min a couple of weeks ago. No cold, cough or fever. Alert and responsive. Does the patient have any new or worsening symptoms? ---Yes Will a triage be completed? ---Yes Related visit to physician within the last 2 weeks? ---No Does the PT have any chronic conditions? (i.e. diabetes, asthma, this includes High risk factors for pregnancy, etc.) ---Yes List chronic conditions. ---CHF, Diabetes, High Blood Pressure (137/78) Is this a behavioral health or substance abuse call? ---No Guidelines Guideline Title Affirmed Question Affirmed Notes Nurse Date/Time (Eastern Time) COPD Oxygen Monitoring and Hypoxia [1] MODERATE difficulty breathing (e.g., speaks in phrases, SOB even at rest) AND [2] worse than normal Vallery Sa, RN, [place] 05/23/2021 1:52:11 PM PLEASE NOTE: All timestamps contained within this report are represented as [place] Standard Time. CONFIDENTIALTY NOTICE: This fax transmission is intended only for the addressee. It contains information that is legally privileged, confidential or otherwise protected from use or disclosure. If you are not the intended recipient, you are strictly prohibited from reviewing, disclosing, copying using or disseminating any of this information or taking any action in reliance on or regarding this information. If you have received this fax in error, please notify us immediately by telephone so that we can arrange for its return to [place]. Phone: [phone], Toll-Free: [phone], Fax: [phone] Page: 2 of 2 Call Id: [id number] [place]. Time Eilene Ghazi Time) Disposition Final User 05/23/2021 1:44:17 PM Send to Urgent  Ellison Carwin, Marye Round 05/23/2021 1:57:26 PM Go to ED Now (or PCP triage) Yes Vallery Sa, RN, [place]y [place] Disagree/Comply Comply Caller Understands Yes PreDisposition Call Doctor Care Advice Given Per Guideline GO TO ED NOW (OR PCP TRIAGE): * IF NO PCP (PRIMARY CARE PROVIDER) SECOND-LEVEL TRIAGE: You need to be seen within the next hour. Go to the [facility] at _____________ [place] as soon as you can. BRING MEDICINES: * Bring a list of your current medicines when you go to the Emergency Department (ER). * Bring the pill bottles too. This will help the doctor (or NP/PA) to make certain you are taking the right medicines and the right dose. CARE ADVICE given per COPD Oxygen Monitoring and Hypoxia (Adult) guideline. Referrals GO TO FACILITY UNDECIDED

## 2021-05-24 NOTE — Telephone Encounter (Signed)
Pt c/o Shortness Of Breath: STAT if SOB developed within the last 24 hours or pt is noticeably SOB on the phone  1. Are you currently SOB (can you hear that pt is SOB on the phone)? Yes   2. How long have you been experiencing SOB? Four days   3. Are you SOB when sitting or when up moving around? Both   4. Are you currently experiencing any other symptoms? No

## 2021-05-24 NOTE — Telephone Encounter (Signed)
Advised pt to be seen at ER if sxs are worsening. Pt aware and voices understanding.

## 2021-05-25 ENCOUNTER — Emergency Department (HOSPITAL_COMMUNITY): Payer: Medicare Other

## 2021-05-25 ENCOUNTER — Inpatient Hospital Stay (HOSPITAL_COMMUNITY)
Admission: EM | Admit: 2021-05-25 | Discharge: 2021-05-29 | DRG: 291 | Disposition: A | Discharge status: Home Health | Payer: Medicare Other | Attending: Internal Medicine | Admitting: Internal Medicine

## 2021-05-25 DIAGNOSIS — E049 Nontoxic goiter, unspecified: Secondary | ICD-10-CM | POA: Diagnosis present

## 2021-05-25 DIAGNOSIS — D649 Anemia, unspecified: Secondary | ICD-10-CM | POA: Diagnosis not present

## 2021-05-25 DIAGNOSIS — Z6835 Body mass index (BMI) 35.0-35.9, adult: Secondary | ICD-10-CM

## 2021-05-25 DIAGNOSIS — Z794 Long term (current) use of insulin: Secondary | ICD-10-CM | POA: Diagnosis not present

## 2021-05-25 DIAGNOSIS — I5032 Chronic diastolic (congestive) heart failure: Secondary | ICD-10-CM | POA: Diagnosis not present

## 2021-05-25 DIAGNOSIS — I5082 Biventricular heart failure: Secondary | ICD-10-CM | POA: Diagnosis present

## 2021-05-25 DIAGNOSIS — I2510 Atherosclerotic heart disease of native coronary artery without angina pectoris: Secondary | ICD-10-CM | POA: Diagnosis present

## 2021-05-25 DIAGNOSIS — N3281 Overactive bladder: Secondary | ICD-10-CM | POA: Diagnosis present

## 2021-05-25 DIAGNOSIS — N179 Acute kidney failure, unspecified: Secondary | ICD-10-CM | POA: Diagnosis not present

## 2021-05-25 DIAGNOSIS — E1122 Type 2 diabetes mellitus with diabetic chronic kidney disease: Secondary | ICD-10-CM | POA: Diagnosis present

## 2021-05-25 DIAGNOSIS — E119 Type 2 diabetes mellitus without complications: Secondary | ICD-10-CM | POA: Diagnosis not present

## 2021-05-25 DIAGNOSIS — G8929 Other chronic pain: Secondary | ICD-10-CM | POA: Diagnosis present

## 2021-05-25 DIAGNOSIS — Z20822 Contact with and (suspected) exposure to covid-19: Secondary | ICD-10-CM | POA: Diagnosis present

## 2021-05-25 DIAGNOSIS — J811 Chronic pulmonary edema: Secondary | ICD-10-CM | POA: Diagnosis not present

## 2021-05-25 DIAGNOSIS — I5033 Acute on chronic diastolic (congestive) heart failure: Secondary | ICD-10-CM | POA: Diagnosis present

## 2021-05-25 DIAGNOSIS — Z8249 Family history of ischemic heart disease and other diseases of the circulatory system: Secondary | ICD-10-CM

## 2021-05-25 DIAGNOSIS — N1832 Chronic kidney disease, stage 3b: Secondary | ICD-10-CM | POA: Diagnosis present

## 2021-05-25 DIAGNOSIS — D696 Thrombocytopenia, unspecified: Secondary | ICD-10-CM | POA: Diagnosis present

## 2021-05-25 DIAGNOSIS — I130 Hypertensive heart and chronic kidney disease with heart failure and stage 1 through stage 4 chronic kidney disease, or unspecified chronic kidney disease: Principal | ICD-10-CM | POA: Diagnosis present

## 2021-05-25 DIAGNOSIS — Z7982 Long term (current) use of aspirin: Secondary | ICD-10-CM

## 2021-05-25 DIAGNOSIS — Z87440 Personal history of urinary (tract) infections: Secondary | ICD-10-CM

## 2021-05-25 DIAGNOSIS — J9611 Chronic respiratory failure with hypoxia: Secondary | ICD-10-CM | POA: Diagnosis not present

## 2021-05-25 DIAGNOSIS — J449 Chronic obstructive pulmonary disease, unspecified: Secondary | ICD-10-CM | POA: Diagnosis present

## 2021-05-25 DIAGNOSIS — E1129 Type 2 diabetes mellitus with other diabetic kidney complication: Secondary | ICD-10-CM | POA: Diagnosis not present

## 2021-05-25 DIAGNOSIS — I272 Pulmonary hypertension, unspecified: Secondary | ICD-10-CM | POA: Diagnosis not present

## 2021-05-25 DIAGNOSIS — R0602 Shortness of breath: Secondary | ICD-10-CM | POA: Diagnosis not present

## 2021-05-25 DIAGNOSIS — N289 Disorder of kidney and ureter, unspecified: Secondary | ICD-10-CM | POA: Diagnosis not present

## 2021-05-25 DIAGNOSIS — N183 Chronic kidney disease, stage 3 unspecified: Secondary | ICD-10-CM | POA: Diagnosis present

## 2021-05-25 DIAGNOSIS — I10 Essential (primary) hypertension: Secondary | ICD-10-CM | POA: Diagnosis not present

## 2021-05-25 DIAGNOSIS — J9610 Chronic respiratory failure, unspecified whether with hypoxia or hypercapnia: Secondary | ICD-10-CM | POA: Diagnosis not present

## 2021-05-25 DIAGNOSIS — Z9981 Dependence on supplemental oxygen: Secondary | ICD-10-CM

## 2021-05-25 DIAGNOSIS — Z79890 Hormone replacement therapy (postmenopausal): Secondary | ICD-10-CM

## 2021-05-25 DIAGNOSIS — Z7401 Bed confinement status: Secondary | ICD-10-CM | POA: Diagnosis not present

## 2021-05-25 DIAGNOSIS — E039 Hypothyroidism, unspecified: Secondary | ICD-10-CM | POA: Diagnosis present

## 2021-05-25 DIAGNOSIS — I517 Cardiomegaly: Secondary | ICD-10-CM | POA: Diagnosis not present

## 2021-05-25 DIAGNOSIS — E785 Hyperlipidemia, unspecified: Secondary | ICD-10-CM | POA: Diagnosis present

## 2021-05-25 DIAGNOSIS — N250 Renal osteodystrophy: Secondary | ICD-10-CM | POA: Diagnosis not present

## 2021-05-25 DIAGNOSIS — J988 Other specified respiratory disorders: Secondary | ICD-10-CM | POA: Diagnosis not present

## 2021-05-25 DIAGNOSIS — Z87891 Personal history of nicotine dependence: Secondary | ICD-10-CM

## 2021-05-25 DIAGNOSIS — K7460 Unspecified cirrhosis of liver: Secondary | ICD-10-CM | POA: Diagnosis present

## 2021-05-25 DIAGNOSIS — Z905 Acquired absence of kidney: Secondary | ICD-10-CM | POA: Diagnosis not present

## 2021-05-25 DIAGNOSIS — I110 Hypertensive heart disease with heart failure: Secondary | ICD-10-CM | POA: Diagnosis not present

## 2021-05-25 DIAGNOSIS — M549 Dorsalgia, unspecified: Secondary | ICD-10-CM | POA: Diagnosis present

## 2021-05-25 DIAGNOSIS — M8580 Other specified disorders of bone density and structure, unspecified site: Secondary | ICD-10-CM | POA: Diagnosis present

## 2021-05-25 DIAGNOSIS — I959 Hypotension, unspecified: Secondary | ICD-10-CM | POA: Diagnosis not present

## 2021-05-25 DIAGNOSIS — I5031 Acute diastolic (congestive) heart failure: Secondary | ICD-10-CM | POA: Diagnosis not present

## 2021-05-25 DIAGNOSIS — E669 Obesity, unspecified: Secondary | ICD-10-CM | POA: Diagnosis not present

## 2021-05-25 DIAGNOSIS — Z85528 Personal history of other malignant neoplasm of kidney: Secondary | ICD-10-CM

## 2021-05-25 DIAGNOSIS — I129 Hypertensive chronic kidney disease with stage 1 through stage 4 chronic kidney disease, or unspecified chronic kidney disease: Secondary | ICD-10-CM | POA: Diagnosis not present

## 2021-05-25 DIAGNOSIS — K7581 Nonalcoholic steatohepatitis (NASH): Secondary | ICD-10-CM | POA: Diagnosis not present

## 2021-05-25 DIAGNOSIS — Z825 Family history of asthma and other chronic lower respiratory diseases: Secondary | ICD-10-CM

## 2021-05-25 DIAGNOSIS — N1831 Chronic kidney disease, stage 3a: Secondary | ICD-10-CM | POA: Diagnosis not present

## 2021-05-25 DIAGNOSIS — I509 Heart failure, unspecified: Secondary | ICD-10-CM

## 2021-05-25 LAB — COMPREHENSIVE METABOLIC PANEL
ALT: 17 U/L (ref 0–44)
AST: 17 U/L (ref 15–41)
Albumin: 3.3 g/dL — ABNORMAL LOW (ref 3.5–5.0)
Alkaline Phosphatase: 48 U/L (ref 38–126)
Anion gap: 9 (ref 5–15)
BUN: 57 mg/dL — ABNORMAL HIGH (ref 8–23)
CO2: 25 mmol/L (ref 22–32)
Calcium: 8.7 mg/dL — ABNORMAL LOW (ref 8.9–10.3)
Chloride: 100 mmol/L (ref 98–111)
Creatinine, Ser: 2.54 mg/dL — ABNORMAL HIGH (ref 0.44–1.00)
GFR, Estimated: 18 mL/min — ABNORMAL LOW (ref >60)
Glucose, Bld: 154 mg/dL — ABNORMAL HIGH (ref 70–99)
Potassium: 5.1 mmol/L (ref 3.5–5.1)
Sodium: 134 mmol/L — ABNORMAL LOW (ref 135–145)
Total Bilirubin: 0.8 mg/dL (ref 0.3–1.2)
Total Protein: 6.1 g/dL — ABNORMAL LOW (ref 6.5–8.1)

## 2021-05-25 LAB — CBC WITH DIFFERENTIAL/PLATELET
Abs Immature Granulocytes: 0.04 10*3/uL (ref 0.00–0.07)
Basophils Absolute: 0 10*3/uL (ref 0.0–0.1)
Basophils Relative: 1 %
Eosinophils Absolute: 0 10*3/uL (ref 0.0–0.5)
Eosinophils Relative: 1 %
HCT: 31.5 % — ABNORMAL LOW (ref 36.0–46.0)
Hemoglobin: 10.1 g/dL — ABNORMAL LOW (ref 12.0–15.0)
Immature Granulocytes: 1 %
Lymphocytes Relative: 12 %
Lymphs Abs: 0.7 10*3/uL (ref 0.7–4.0)
MCH: 30.2 pg (ref 26.0–34.0)
MCHC: 32.1 g/dL (ref 30.0–36.0)
MCV: 94.3 fL (ref 80.0–100.0)
Monocytes Absolute: 0.7 10*3/uL (ref 0.1–1.0)
Monocytes Relative: 11 %
Neutro Abs: 4.6 10*3/uL (ref 1.7–7.7)
Neutrophils Relative %: 74 %
Platelets: 92 10*3/uL — ABNORMAL LOW (ref 150–400)
RBC: 3.34 MIL/uL — ABNORMAL LOW (ref 3.87–5.11)
RDW: 13.3 % (ref 11.5–15.5)
WBC: 6.1 10*3/uL (ref 4.0–10.5)
nRBC: 0 % (ref 0.0–0.2)

## 2021-05-25 LAB — URINALYSIS, ROUTINE W REFLEX MICROSCOPIC
Bilirubin Urine: NEGATIVE
Glucose, UA: NEGATIVE mg/dL
Hgb urine dipstick: NEGATIVE
Ketones, ur: NEGATIVE mg/dL
Leukocytes,Ua: NEGATIVE
Nitrite: NEGATIVE
Protein, ur: NEGATIVE mg/dL
Specific Gravity, Urine: 1.011 (ref 1.005–1.030)
pH: 5 (ref 5.0–8.0)

## 2021-05-25 LAB — SARS CORONAVIRUS 2 (TAT 6-24 HRS): SARS Coronavirus 2: NEGATIVE

## 2021-05-25 LAB — HEMOGLOBIN A1C
Hgb A1c MFr Bld: 6.3 % — ABNORMAL HIGH (ref 4.8–5.6)
Mean Plasma Glucose: 134.11 mg/dL

## 2021-05-25 LAB — SODIUM, URINE, RANDOM: Sodium, Ur: 80 mmol/L

## 2021-05-25 LAB — TROPONIN I (HIGH SENSITIVITY): Troponin I (High Sensitivity): 8 ng/L (ref <18)

## 2021-05-25 LAB — CREATININE, URINE, RANDOM: Creatinine, Urine: 61.48 mg/dL

## 2021-05-25 LAB — PROTIME-INR
INR: 1.2 (ref 0.8–1.2)
Prothrombin Time: 15.6 seconds — ABNORMAL HIGH (ref 11.4–15.2)

## 2021-05-25 LAB — BRAIN NATRIURETIC PEPTIDE: B Natriuretic Peptide: 297.7 pg/mL — ABNORMAL HIGH (ref 0.0–100.0)

## 2021-05-25 LAB — CBG MONITORING, ED: Glucose-Capillary: 181 mg/dL — ABNORMAL HIGH (ref 70–99)

## 2021-05-25 MED ORDER — INSULIN ASPART 100 UNIT/ML IJ SOLN
0.0000 [IU] | INTRAMUSCULAR | Status: DC
Start: 2021-05-25 — End: 2021-05-29
  Administered 2021-05-25: 2 [IU] via SUBCUTANEOUS
  Administered 2021-05-26: 3 [IU] via SUBCUTANEOUS
  Administered 2021-05-26 (×2): 2 [IU] via SUBCUTANEOUS
  Administered 2021-05-26 – 2021-05-27 (×2): 3 [IU] via SUBCUTANEOUS
  Administered 2021-05-27: 2 [IU] via SUBCUTANEOUS
  Administered 2021-05-27: 3 [IU] via SUBCUTANEOUS
  Administered 2021-05-27: 5 [IU] via SUBCUTANEOUS
  Administered 2021-05-27: 2 [IU] via SUBCUTANEOUS
  Administered 2021-05-28: 3 [IU] via SUBCUTANEOUS
  Administered 2021-05-28: 7 [IU] via SUBCUTANEOUS
  Administered 2021-05-28: 1 [IU] via SUBCUTANEOUS
  Administered 2021-05-28: 2 [IU] via SUBCUTANEOUS
  Administered 2021-05-28: 7 [IU] via SUBCUTANEOUS
  Administered 2021-05-28 – 2021-05-29 (×2): 3 [IU] via SUBCUTANEOUS
  Administered 2021-05-29 (×2): 5 [IU] via SUBCUTANEOUS

## 2021-05-25 NOTE — ED Triage Notes (Signed)
Increased SOB x 4 days with worsening on exertion. Crackles bases, clear upper lobes. Nausea yesterday, right upper back ache.

## 2021-05-25 NOTE — ED Notes (Signed)
Patient reports she has had CHF for about a year and it "seems like it has been getting worse". Patient reports that she gets fatigued and SOB with mild exertion. Patient is able to speak in complete sentences without difficulty. On assessment, mild crackles were present bilateral posterior bases. Patient denies cough. Skin is warm and dry, color WNL. Patient is noted to have BLE edema +2. Cardiac monitor in place and VS monitored.

## 2021-05-25 NOTE — H&P (Signed)
Margaret Dyer EML:[id number] DOB: June 25, 1935 DOA: 05/25/2021     PCP: Ann Held, DO   Outpatient Specialists:   CARDS: Dr. Debara Pickett     Patient arrived to ER on 05/25/21 at 1404 Referred by Attending Daleen Bo, MD   Patient coming from: home Lives  With family    Chief Complaint  Chief Complaint  Patient presents with   Shortness of Breath    HPI: Margaret Dyer is a 86 y.o. female with medical history significant of  thrombocytopenia, DM2, HTN, CKD, chronic diastolic CHF , HLD, Liver Cirrhosis/NASH, pulmoanry HTN, renal cell CA sp nephrectomy Right heart failure,hypothyroidism, chronic respiratory failure on O2 2L, tobacco abuse    Presented with SOB for the past 4 days probably was going on a bit longer than that.  No chest pain associated she increase her oxygen to 2.75 L from 2.  She has not had any fever or cold symptoms. Patient contacted her primary cardiologist Dr. Debara Pickett who recommended increasing Lasix to 40 twice daily for 3 days she did take 1 extra dose but still short of breath and presented to emergency department. Per family she could barely finish a full sentence.  She has been having some swelling of her ankles as well as midsection.  Of note on June 15 patient was seen at urgent care and was treated with Bactrim for UTI.  Infectious risk factors:  Reports shortness of breath,   Has  been vaccinated against COVID  and boosted   Initial COVID TEST   in house  PCR testing  Pending  Lab Results  Component Value Date   SARSCOV2NAA NOT DETECTED 07/10/2019   [place] NEGATIVE 07/03/2019     Regarding pertinent Chronic problems:     Hyperlipidemia -  on statins vytorin Lipid Panel     Component Value Date/Time   CHOL 122 11/08/2020 1638   TRIG 99 11/08/2020 1638   HDL 45 11/08/2020 1638   CHOLHDL 2.7 11/08/2020 1638   CHOLHDL 5 03/19/2019 1437   VLDL 25.0 03/19/2019 1437   LDLCALC 58 11/08/2020 1638   LDLDIRECT 89.0 01/19/2015  1659   LABVLDL 19 11/08/2020 1638     HTN on amlodipine 5 mg daily, hydralazine 25 mg every 6-8 hours, Lasix 40 mg daily, now up to BID spironolactone 12.5 mg daily, metoprolol 25 mg twice a day. cozaar   chronic CHF diastolic  - last echo 7121 The estimated ejection    fraction was in the range of 55% to 97% (grade 2 diastolic dysfunction).    CAD  - On Aspirin, statin, betablocker, P                 -  followed by cardiology                - last cardiac cath 2018      DM 2 -  Lab Results  Component Value Date   HGBA1C 6.8 (H) 11/08/2020   on insulin,    Hypothyroidism:  Lab Results  Component Value Date   TSH 0.616 11/08/2020   on synthroid    obesity-   BMI Readings from Last 1 Encounters:  05/25/21 36.14 kg/m      Asthma -well  controlled on home inhalers        CKD stage IIIa- baseline Cr 1.41    Lab Results  Component Value Date   CREATININE 2.54 (H) 05/25/2021   CREATININE 1.19 (H) 03/29/2021  CREATININE 1.41 (H) 03/28/2021    Liver disease  due to NASH     While in ER: Noted to be in fluid up but also somewhat increased cr and initially soft BP     ED Triage Vitals  Enc Vitals Group     BP 05/25/21 1415 (!) 117/46     Pulse Rate 05/25/21 1415 66     Resp 05/25/21 1415 20     Temp 05/25/21 1415 97.7 F (36.5 C)     Temp Source 05/25/21 1415 Oral     SpO2 05/25/21 1412 96 %     Weight 05/25/21 1417 204 lb (92.5 kg)     Height 05/25/21 1417 _0  (1.6 m)     Head Circumference --      Peak Flow --      Pain Score 05/25/21 1416 7     Pain Loc --      Pain Edu? --      Excl. in [place]? --   TMAX(24)@     _________________________________________ Significant initial  Findings: Abnormal Labs Reviewed  CBC WITH DIFFERENTIAL/PLATELET - Abnormal; Notable for the following components:      Result Value   RBC 3.34 (*)    Hemoglobin 10.1 (*)    HCT 31.5 (*)    Platelets 92 (*)    All other components within normal limits  COMPREHENSIVE  METABOLIC PANEL - Abnormal; Notable for the following components:   Sodium 134 (*)    Glucose, Bld 154 (*)    BUN 57 (*)    Creatinine, Ser 2.54 (*)    Calcium 8.7 (*)    Total Protein 6.1 (*)    Albumin 3.3 (*)    GFR, Estimated 18 (*)    All other components within normal limits  BRAIN NATRIURETIC PEPTIDE - Abnormal; Notable for the following components:   B Natriuretic Peptide 297.7 (*)    All other components within normal limits   ____________________________________________ Ordered    CXR - Cardiomegaly with vascular congestion and mild interstitial edema    _________________________ Troponin 8   ECG: Ordered Personally reviewed by me showing: HR : 57 Rhythm:  Sinus bradycardia    no evidence of ischemic changes QTC 431 __________  The recent clinical data is shown below. Vitals:   05/25/21 1600 05/25/21 1615 05/25/21 1630 05/25/21 1645  BP: (!) 108/46 (!) 115/54 (!) 116/44 (!) 113/44  Pulse: 60 (!) 56 60 (!) 57  Resp: _1 Temp:      TempSrc:      SpO2: 97% 96% 94% 95%  Weight:      Height:        WBC     Component Value Date/Time   WBC 6.1 05/25/2021 1523   LYMPHSABS 0.7 05/25/2021 1523   LYMPHSABS 1.3 03/05/2015 1207   MONOABS 0.7 05/25/2021 1523   EOSABS 0.0 05/25/2021 1523   EOSABS 0.1 03/05/2015 1207   BASOSABS 0.0 05/25/2021 1523   BASOSABS 0.0 03/05/2015 1207     Lactic Acid, Venous    Component Value Date/Time   LATICACIDVEN 1.3 07/10/2019 1923      UA no evidence of UTI      Urine analysis:    Component Value Date/Time   COLORURINE YELLOW 05/25/2021 1927   APPEARANCEUR CLOUDY (A) 05/25/2021 1927   LABSPEC 1.011 05/25/2021 1927   PHURINE 5.0 05/25/2021 1927   GLUCOSEU NEGATIVE 05/25/2021 1927   GLUCOSEU NEGATIVE 07/21/2019 1614   HGBUR  NEGATIVE 05/25/2021 1927   HGBUR large 02/17/2011 1432   BILIRUBINUR NEGATIVE 05/25/2021 1927   BILIRUBINUR negative 05/18/2021 1935   BILIRUBINUR neg 01/31/2019 [place]  05/25/2021 1927   PROTEINUR NEGATIVE 05/25/2021 1927   UROBILINOGEN 0.2 05/18/2021 1935   UROBILINOGEN 0.2 07/21/2019 1614   NITRITE NEGATIVE 05/25/2021 1927   LEUKOCYTESUR NEGATIVE 05/25/2021 1927    Results for orders placed or performed during the hospital encounter of 05/18/21  Urine Culture     Status: Abnormal   Collection Time: 05/18/21  7:43 PM   Specimen: Urine, Random  Result Value Ref Range Status   Specimen Description URINE, RANDOM  Final   Special Requests   Final    NONE Performed at [facility], [place] [address], [place], [place] [address]    Culture >=100,000 COLONIES/mL KLEBSIELLA PNEUMONIAE (A)  Final   Report Status 05/22/2021 FINAL  Final   Organism ID, Bacteria KLEBSIELLA PNEUMONIAE (A)  Final      Susceptibility   Klebsiella pneumoniae - MIC*    AMPICILLIN RESISTANT Resistant     CEFAZOLIN <=4 SENSITIVE Sensitive     CEFEPIME <=0.12 SENSITIVE Sensitive     CEFTRIAXONE <=0.25 SENSITIVE Sensitive     CIPROFLOXACIN <=0.25 SENSITIVE Sensitive     GENTAMICIN <=1 SENSITIVE Sensitive     IMIPENEM <=0.25 SENSITIVE Sensitive     NITROFURANTOIN 32 SENSITIVE Sensitive     TRIMETH/SULFA <=20 SENSITIVE Sensitive     AMPICILLIN/SULBACTAM <=2 SENSITIVE Sensitive     PIP/TAZO <=4 SENSITIVE Sensitive     * >=100,000 COLONIES/mL KLEBSIELLA PNEUMONIAE    ____________________ Hospitalist was called for admission for CHF exacerbation  The following Work up has been ordered so far:  Orders Placed This Encounter  Procedures   SARS CORONAVIRUS 2 (TAT 6-24 HRS) Nasopharyngeal Nasopharyngeal Swab   DG Chest Port 1 View   CBC with Differential/Platelet   Comprehensive metabolic panel   Brain natriuretic peptide   Cardiac monitoring   Consult to hospitalist   ED EKG   EKG 12-Lead   Saline lock IV      Following Medications were ordered in ER: Medications - No data to display      Consult Orders  (From admission, onward)           Start     Ordered    05/25/21 1819  Consult to hospitalist  Once       Provider:  (Not yet assigned)  Question Answer Comment  Place call to: [facility]   Reason for Consult Admit      05/25/21 1819              OTHER Significant initial  Findings:  labs showing:    Recent Labs  Lab 05/25/21 1523  NA 134*  K 5.1  CO2 25  GLUCOSE 154*  BUN 57*  CREATININE 2.54*  CALCIUM 8.7*    Cr   Up from baseline see below Lab Results  Component Value Date   CREATININE 2.54 (H) 05/25/2021   CREATININE 1.19 (H) 03/29/2021   CREATININE 1.41 (H) 03/28/2021    Recent Labs  Lab 05/25/21 1523  AST 17  ALT 17  ALKPHOS 48  BILITOT 0.8  PROT 6.1*  ALBUMIN 3.3*   Lab Results  Component Value Date   CALCIUM 8.7 (L) 05/25/2021   PHOS 2.6 12/29/2018          Plt: Lab Results  Component Value Date   PLT 92 (  L) 05/25/2021        Recent Labs  Lab 05/25/21 1523  WBC 6.1  NEUTROABS 4.6  HGB 10.1*  HCT 31.5*  MCV 94.3  PLT 92*    HG/HCT Down from baseline see below    Component Value Date/Time   HGB 10.1 (L) 05/25/2021 1523   HGB 14.2 03/28/2021 1338   HGB 13.8 11/08/2020 1638   HGB 16.0 (H) 03/05/2015 1207   HCT 31.5 (L) 05/25/2021 1523   HCT 40.1 11/08/2020 1638   HCT 45.8 03/05/2015 1207   MCV 94.3 05/25/2021 1523   MCV 90 11/08/2020 1638   MCV 88 03/05/2015 1207      BNP (last 3 results) Recent Labs    05/25/21 1523  BNP 297.7*     DM  labs:  HbA1C: Recent Labs    11/08/20 1638  HGBA1C 6.8*        CBG (last 3)  Recent Labs    05/25/21 2032  GLUCAP 181*          Cultures:    Component Value Date/Time   SDES URINE, RANDOM 05/18/2021 1943   SPECREQUEST  05/18/2021 1943    NONE Performed at [facility], [place] [address], [place], [place] [address]    CULT >=100,000 COLONIES/mL KLEBSIELLA PNEUMONIAE (A) 05/18/2021 1943   REPTSTATUS 05/22/2021 FINAL 05/18/2021 1943     Radiological Exams on Admission: DG Chest Port 1  View  Result Date: 05/25/2021 CLINICAL DATA:  Shortness of breath EXAM: PORTABLE CHEST 1 VIEW COMPARISON:  07/10/2019, CT 12/01/2017 FINDINGS: Cardiomegaly with vascular congestion and mild interstitial pulmonary edema. No sizable effusion. No focal consolidation. Atelectasis or scarring in the left mid lung. Aortic atherosclerosis. No pneumothorax. IMPRESSION: Cardiomegaly with vascular congestion and mild interstitial edema Electronically Signed   By: Donavan Foil M.D.   On: 05/25/2021 15:41   _______________________________________________________________________________________________________ Latest  Blood pressure (!) 113/44, pulse (!) 57, temperature 97.7 F (36.5 C), temperature source Oral, resp. rate 19, height _0  (1.6 m), weight 92.5 kg, SpO2 95 %.   Review of Systems:    Pertinent positives include:   fatigue,  shortness of breath at rest. No dyspnea on exertion  Constitutional:  No weight loss, night sweats, Fevers, chills HEENT:  No headaches, Difficulty swallowing,Tooth/dental problems,Sore throat,  No sneezing, itching, ear ache, nasal congestion, post nasal drip,  Cardio-vascular:  No chest pain, Orthopnea, PND, anasarca, dizziness, palpitations.no Bilateral lower extremity swelling  GI:  No heartburn, indigestion, abdominal pain, nausea, vomiting, diarrhea, change in bowel habits, loss of appetite, melena, blood in stool, hematemesis Resp:  no , No excess mucus, no productive cough, No non-productive cough, No coughing up of blood.No change in color of mucus.No wheezing. Skin:  no rash or lesions. No jaundice GU:  no dysuria, change in color of urine, no urgency or frequency. No straining to urinate.  No flank pain.  Musculoskeletal:  No joint pain or no joint swelling. No decreased range of motion. No back pain.  Psych:  No change in mood or affect. No depression or anxiety. No memory loss.  Neuro: no localizing neurological complaints, no tingling, no weakness,  no double vision, no gait abnormality, no slurred speech, no confusion  All systems reviewed and apart from [place] all are negative _______________________________________________________________________________________________ Past Medical History:   Past Medical History:  Diagnosis Date   Asthma    Blood transfusion    Chronic diastolic CHF (congestive heart failure) (HCC)    CKD (chronic kidney  disease), stage III ([place])    Coronary artery calcification seen on CT scan    Diabetes mellitus    Goiter    Hyperlipidemia    Hypertension    Hypothyroidism    Liver cirrhosis (HCC)    NASH (nonalcoholic steatohepatitis)    Obesity    Osteopenia    Pulmonary hypertension (HCC)    Renal cell carcinoma    a. prior nephrectomy.   Right heart failure (HCC)    Thrombocytopenia ([place])       Past Surgical History:  Procedure Laterality Date   ABDOMINAL HYSTERECTOMY     APPENDECTOMY     HEMORRHOID SURGERY     INTRAMEDULLARY (IM) NAIL INTERTROCHANTERIC Left 12/25/2018   Procedure: INTRAMEDULLARY (IM) NAIL INTERTROCHANTRIC;  Surgeon: Nicholes Stairs, MD;  Location: [facility];  Service: Orthopedics;  Laterality: Left;   KNEE SURGERY     NEPHRECTOMY  09.2000    Social History:  Ambulatory  walker        reports that she has quit smoking. She has never used smokeless tobacco. She reports that she does not drink alcohol and does not use drugs.     Family History:   Family History  Problem Relation Age of Onset   Cancer Sister        bladder   Kidney cancer Father    Cancer Father        renal   COPD Father    Congestive Heart Failure Father    Coronary artery disease Other    Diabetes Other    Hyperlipidemia Other    Hypertension Other    Rheumatologic disease Neg Hx    ______________________________________________________________________________________________ Allergies: Allergies  Allergen Reactions   Other Other (See Comments)    Patient has hemorrhaged at least  4 times in her lifetime   Cefuroxime Axetil Nausea Only    Upset stomach    Ciprofloxacin Nausea Only    Upset stomach     Prior to Admission medications   Medication Sig Start Date End Date Taking? Authorizing Provider  amLODipine (NORVASC) 5 MG tablet Take 1 tablet (5 mg total) by mouth 2 (two) times daily. Take second dose before bed. 04/08/21   Almyra Deforest, PA  aspirin EC 81 MG EC tablet Take 1 tablet (81 mg total) by mouth daily. 07/07/19   Geradine Girt, DO  docusate sodium (COLACE) 100 MG capsule Take 1 capsule (100 mg total) by mouth 2 (two) times daily. 12/30/18   Bonnell Public, MD  ezetimibe-simvastatin (VYTORIN) 10-40 MG tablet Take 1 tablet by mouth daily.    [provider]  furosemide (LASIX) 40 MG tablet Take 1 tablet (40 mg total) by mouth daily. 11/08/20   Hilty, Nadean Corwin, MD  hydrALAZINE (APRESOLINE) 25 MG tablet Take 2 tablet ( 50 mg ) every 8 hours 03/30/21   Hilty, Nadean Corwin, MD  insulin glargine (LANTUS) 100 UNIT/ML injection Inject 0.25 mLs (25 Units total) into the skin at bedtime. Patient taking differently: Inject 30 Units into the skin at bedtime. 07/06/19   Geradine Girt, DO  Insulin Glulisine (APIDRA SOLOSTAR) 100 UNIT/ML Solostar Pen Inject 10-15 Units into the skin 3 (three) times daily before meals.     [provider]  levothyroxine (SYNTHROID, LEVOTHROID) 25 MCG tablet take 1 tablet by mouth daily 07/13/17   Carollee Herter, Alferd Apa, DO  losartan (COZAAR) 50 MG tablet Take 1 tablet (50 mg total) by mouth daily. 11/08/20   Hilty, Nadean Corwin,  MD  metoprolol tartrate (LOPRESSOR) 25 MG tablet Take 1 tablet (25 mg total) by mouth 2 (two) times daily. 11/08/20   Hilty, Nadean Corwin, MD  Multiple Vitamins-Minerals (CENTRUM SILVER ULTRA WOMENS PO) Take 1 tablet by mouth at bedtime.     [provider]  oxybutynin (DITROPAN) 5 MG tablet TAKE 1 TABLET BY MOUTH DAILY 04/08/21   Carollee Herter, Alferd Apa, DO  potassium chloride SA (KLOR-CON) 20 MEQ tablet  TAKE 1 TABLET BY MOUTH DAILY AT BEDTIME 02/24/21   Hilty, Nadean Corwin, MD  spironolactone (ALDACTONE) 25 MG tablet TAKE 1/2 TABLETS BY MOUTH DAILY 09/24/20   Duke, Tami Lin, PA  sucralfate (CARAFATE) 1 g tablet Take 1 tablet (1 g total) by mouth 4 (four) times daily -  with meals and at bedtime. 12/02/19   Ann Held, DO  sulfamethoxazole-trimethoprim (BACTRIM DS) 800-160 MG tablet Take 1 tablet by mouth 2 (two) times daily for 7 days. 05/18/21 05/25/21  Wieters, Hallie C, PA-C  traMADol (ULTRAM) 50 MG tablet TAKE 1 TABLET BY MOUTH EVERY 6 HOURS AS NEEDED FOR MODERATE PAIN 12/27/20   Ann Held, DO    ___________________________________________________________________________________________________ Physical Exam: Vitals with BMI 05/25/2021 05/25/2021 05/25/2021  Height - - -  Weight - - -  BMI - - -  Systolic 193 790 240  Diastolic 44 44 54  Pulse 57 60 56     1. General:  in No  Acute distress    Chronically ill    -appearing 2. Psychological: Alert and  Oriented 3. Head/ENT:   Moist  Mucous Membranes                          Head Non traumatic, neck supple                           Poor Dentition 4. SKIN: normal   Skin turgor,  Skin clean Dry and intact no rash 5. Heart: Regular rate and rhythm no   Murmur, no Rub or gallop 6. Lungs:   no wheezes or crackles   7. Abdomen: Soft,  non-tender, Non distended  obese  bowel sounds present 8. Lower extremities: no clubbing, cyanosis,  trace edema 9. Neurologically Grossly intact, moving all 4 extremities equally   10. MSK: Normal range of motion    Chart has been reviewed  ______________________________________________________________________________________________  Assessment/Plan 86 y.o. female with medical history significant of  thrombocytopenia, DM2, HTN, CKD, chronic diastolic CHF , HLD, Liver Cirrhosis/NASH, pulmoanry HTN, renal cell CA sp nephrectomy Right heart failure,hypothyroidism, chronic  respiratory failure on O2 2L, tobacco abuse   Admitted for diastolic CHF exacerbation  Present on Admission:  Acute on chronic diastolic CHF (congestive heart failure) ([place]) - - admit on telemetry,  cycle cardiac enzymes, Troponin    obtain serial ECG  to evaluate for ischemia as a cause of heart failure  monitor daily weight:  Filed Weights   05/25/21 1417  Weight: 92.5 kg   Last BNP BNP (last 3 results) Recent Labs    05/25/21 1523  BNP 297.7*      diurese with IV lasix and monitor orthostatics and creatinine to avoid over diuresis.  Order echogram to evaluate EF and valves  ACE/ARBi held due to CKD and AKI   cardiology consulted    Thrombocytopenia ([place]) - chronic stable liekly due to Cirrhosis   Pulmonary hypertension ([place]) on oxygen we  will   Continue   Overactive bladder -continue Ditropan   Obesity (BMI 30-39.9)  - chronic , will need out pt follow up   Liver cirrhosis secondary to NASH ([place]) - worsening renal failure will hold spironolactone   Hypothyroidism - - Check TSH continue home medications at current dose   Hypertension - initially soft BP to  allow room for diuresis will hold of on Norvasc and hydralazine   Hyperlipidemia LDL goal <70 - cont statin     CKD (chronic kidney disease), stage III (HCC) -  -chronic avoid nephrotoxic medications such as NSAIDs, Vanco Zosyn combo,  avoid hypotension, continue to follow renal function   Chronic respiratory failure ([place]) - acute on chronic resp failure with hypoxia due to CHF Provide O2 as needed   Acute kidney injury ([place]) - increased in Cr could be due to exposure to Bactrim  Will hold Cozaar and spironolcatone  DM2 -  - Order Sensitive  SSI   - continue home insulin regimen    Lantus decreased to 25 units,  -  check TSH and HgA1C     Other plan as per orders.  DVT prophylaxis: SCD      Code Status:    Code Status: Prior FULL CODE  as per patient  I had personally discussed CODE STATUS with  patient      Family Communication:   Family not at  Bedside    Disposition Plan:      To home once workup is complete and patient is stable   Following barriers for discharge:                            Electrolytes corrected                                                           Will need to be able to tolerate PO                                                      Will need consultants to evaluate patient prior to discharge                     Would benefit from PT/OT eval prior to DC  Ordered                                        Consults called:   emailed cardiology   Admission status:  ED Disposition     ED Disposition  Admit   Condition  --   [place]: [place] [[id number]]  Level of Care: Telemetry Cardiac [[id number]]  May admit patient to [facility] or [facility] if equivalent level of care is available:: No  Covid Evaluation: Asymptomatic Screening Protocol (No Symptoms)  Diagnosis: CHF exacerbation [facility]) [[id number]]  Admitting Physician: Toy Baker [[id number]]  Attending Physician: Toy Baker [[id number]]  Estimated length of stay: past midnight tomorrow  Certification:: I certify this patient will need  inpatient services for at least 2 midnights         inpatient     I Expect 2 midnight stay secondary to severity of patient's current illness need for inpatient interventions justified by the following:  hemodynamic instability despite optimal treatment (hypoxia,  )  Severe lab/radiological/exam abnormalities including:    Fluid overload and extensive comorbidities including:    DM2    CHF   CAD  COPD/asthma  Obesity  CKD    liver disease    That are currently affecting medical management.   I expect  patient to be hospitalized for 2 midnights requiring inpatient medical care.  Patient is at high risk for adverse outcome (such as loss of life or disability) if not treated.  Indication for inpatient stay as  follows:     New or worsening hypoxia  Need for   IV diuretics    Level of care    tele  For  24H       Lab Results  Component Value Date   SARSCOV2NAA NOT DETECTED 07/10/2019     Precautions:    asymptomatic screening protocol   PPE: Used by the provider:   N95  eye Goggles,      Toy Baker 05/25/2021, 10:44 PM    [facility]     after 2 AM please page floor coverage PA If 7AM-7PM, please contact the day team taking care of the patient using [email or web address]   Patient was evaluated in the context of the global COVID-19 pandemic, which necessitated consideration that the patient might be at risk for infection with the SARS-CoV-2 virus that causes COVID-19. Institutional protocols and algorithms that pertain to the evaluation of patients at risk for COVID-19 are in a state of rapid change based on information released by regulatory bodies including the CDC and federal and state organizations. These policies and algorithms were followed during the patient's care.

## 2021-05-25 NOTE — ED Provider Notes (Addendum)
4:30 PM-checkout from Dr. Rogene Houston, to evaluate patient for disposition after return of labs.  Patient presented with shortness of breath.  She has a history of heart failure, pulmonary hypertension, uses oxygen as needed and has a history of solitary kidney.  She has been using oxygen more often, than usual because of her shortness of breath.  She is on chronic oxygen therapy.  Chest x-ray indicates vascular congestion and interstitial edema.  Laboratory evaluation with elevated BUN and creatinine above baseline.  Hemoglobin low.  MDM-patient presenting for shortness of breath, has history of heart failure, on diuretics.  Patient is clinically wet.  Yesterday her cardiologist recommending increasing Lasix to 40 daily for 3 days, and monitor weight.  She has had dyspnea for at least a week.  Last cardiac echo was 2018, with EF of 55 to 60%, and grade 2 diastolic dysfunction.  Today patient's GFR is 18, down from 45, 1 month ago.  She will require hospitalization for management.  She is currently hemodynamically stable with normal oxygen saturations on 2 L nasal cannula.  The patient cannot be managed as an outpatient, and the current status.  Diuresis will be difficult due to her comorbidities.  These include solitary kidney, pulmonary hypertension, and heart failure.  We will call for admission.  6:18 PM-Consult complete with hospitalist. Patient case explained and discussed.  She agrees to admit patient for further evaluation and treatment. Call ended at 6:39 PM   Daleen Bo, MD 05/25/21 1839    EKG Interpretation  Date/Time:  Wednesday May 25 2021 18:41:12 EDT Ventricular Rate:  57 PR Interval:  175 QRS Duration: 109 QT Interval:  443 QTC Calculation: 432 R Axis:   33 Text Interpretation: Sinus rhythm since last tracing no significant change Confirmed by Daleen Bo [phone]) on 05/25/2021 8:13:25 PM           Daleen Bo, MD 05/25/21 2014

## 2021-05-25 NOTE — ED Provider Notes (Signed)
[facility] 2 EMERGENCY DEPARTMENT Provider Note   CSN: [id number] Arrival date & time: 05/25/21  1404     History Chief Complaint  Patient presents with   Shortness of Breath    Margaret Dyer is a 86 y.o. female.  Patient brought in by EMS.  Patient with complaint of shortness of breath for 4 days.  Worsening on exertion exertion.  Some nausea yesterday chronic right upper back ache.  Nothing new about that.  Patient is followed by cardiology yesterday Dr. Debara Pickett was contacted on telephone and recommended Lasix 40 mg twice daily for 3 days.  Patient seen June 15 urgent care had a urinary tract infection treated with Bactrim.  On May 6 seen by cardiology [facility] line.  The delineator past medical history being significant for tobacco abuse hypertension hyperlipidemia cirrhosis type 2 diabetes chronic kidney disease renal cancer status post nephrectomy.  Lower extremity edema and thrombocytopenia.  The cirrhosis is nonalcoholic.  On previous cath patient has chronic diastolic congestive heart failure.  And has component of pulmonary hypertension.  Cardiac cath was in 2018.  Patient is feeling as if she is fluid overloaded.  Patient chronically on 2 L of oxygen.  But patient states that when she exerts herself she has been feeling short of breath.      Past Medical History:  Diagnosis Date   Asthma    Blood transfusion    Chronic diastolic CHF (congestive heart failure) (HCC)    CKD (chronic kidney disease), stage III (HCC)    Coronary artery calcification seen on CT scan    Diabetes mellitus    Goiter    Hyperlipidemia    Hypertension    Hypothyroidism    Liver cirrhosis (HCC)    NASH (nonalcoholic steatohepatitis)    Obesity    Osteopenia    Pulmonary hypertension (HCC)    Renal cell carcinoma    a. prior nephrectomy.   Right heart failure (HCC)    Thrombocytopenia [facility])     Patient Active Problem List   Diagnosis Date Noted   Closed fracture of  maxillary sinus ([place]) 07/10/2019   UTI (urinary tract infection) 07/03/2019   Food poisoning 07/03/2019   Bacteremia 07/03/2019   Exposure to COVID-19 virus 06/16/2019   Closed left hip fracture ([place]) 12/24/2018   Dysuria 12/17/2017   Dizziness 12/17/2017   Community acquired pneumonia 12/17/2017   Pneumonia 12/01/2017   Urinary incontinence 11/09/2017   Tic 11/09/2017   CKD (chronic kidney disease), stage III ([place]) 09/12/2017   Pulmonary hypertension ([place]) 09/12/2017   Chronic diastolic CHF (congestive heart failure) ([place]) 06/05/2017   Oral candida 05/29/2017   Vitamin D deficiency 05/29/2017   Chronic respiratory failure ([place]) 05/24/2017   Anxiety 05/24/2017   HCAP (healthcare-associated pneumonia) 05/22/2017   Right heart failure ([place]) 51/70/0174   Acute diastolic CHF (congestive heart failure) ([place])    Exertional dyspnea 05/13/2017   Hypertension 05/13/2017   Asthma 05/13/2017   DOE (dyspnea on exertion) 05/13/2017   Diabetes mellitus type 2, insulin dependent ([place]) 05/13/2017   Acute kidney injury ([place]) 05/13/2017   History of nephrectomy 05/13/2017   Hyperlipidemia LDL goal <70 05/13/2017   Liver cirrhosis secondary to NASH ([place]) 05/13/2017   Thrombocytopenia ([place]) 05/13/2017   Renal cell carcinoma 05/13/2017   Hypothyroidism 05/13/2017   Hypersplenism 05/13/2017   Osteoarthritis 05/13/2017   Overactive bladder 05/13/2017   URI, acute 02/22/2017   Abrasion, left knee, initial encounter 02/22/2017   CTS (carpal  tunnel syndrome) 03/04/2016   Bilateral contusion of ribs 10/05/2015   Dental abscess 04/15/2014   Claudication, intermittent ([place]) 12/05/2013   Obesity (BMI 30-39.9) 07/22/2013   Back pain 05/26/2013   Platelets decreased ([place]) 05/26/2013   Breast pain, right 04/21/2013   Anxiety and depression 12/05/2012   Bronchitis 11/06/2012   HEMATURIA UNSPECIFIED 02/17/2011   Hyperlipidemia 09/08/2010   B12 DEFICIENCY 07/22/2010   DIZZINESS 07/04/2010    Essential hypertension 06/21/2010   GOUT, UNSPECIFIED 01/27/2010   NEPHRECTOMY, HX OF 12/02/2009   NECK PAIN, LEFT 09/04/2008   PARESTHESIA 09/02/2007   GOITER NOS 04/23/2007   Diabetes mellitus ([place]) 04/23/2007   Asthma 04/23/2007   Renal stone 04/23/2007   OSTEOPENIA 04/23/2007   CARCINOMA, RENAL CELL 08/10/1999    Past Surgical History:  Procedure Laterality Date   ABDOMINAL HYSTERECTOMY     APPENDECTOMY     HEMORRHOID SURGERY     INTRAMEDULLARY (IM) NAIL INTERTROCHANTERIC Left 12/25/2018   Procedure: INTRAMEDULLARY (IM) NAIL INTERTROCHANTRIC;  Surgeon: Nicholes Stairs, MD;  Location: [facility] [place] [facility];  Service: Orthopedics;  Laterality: Left;   KNEE SURGERY     NEPHRECTOMY  09.2000     OB History   No obstetric history on file.     Family History  Problem Relation Age of Onset   Cancer Sister        bladder   Kidney cancer Father    Cancer Father        renal   COPD Father    Congestive Heart Failure Father    Coronary artery disease Other    Diabetes Other    Hyperlipidemia Other    Hypertension Other    Rheumatologic disease Neg Hx     Social History   Tobacco Use   Smoking status: Former    Pack years: 0.00   Smokeless tobacco: Never   Tobacco comments:    05/30/18 none in 40 years  Vaping Use   Vaping Use: Never used  Substance Use Topics   Alcohol use: No    Alcohol/week: 0.0 standard drinks   Drug use: No    Home Medications Prior to Admission medications   Medication Sig Start Date End Date Taking? Authorizing Provider  amLODipine (NORVASC) 5 MG tablet Take 1 tablet (5 mg total) by mouth 2 (two) times daily. Take second dose before bed. 04/08/21   Almyra Deforest, PA  aspirin EC 81 MG EC tablet Take 1 tablet (81 mg total) by mouth daily. 07/07/19   Geradine Girt, DO  docusate sodium (COLACE) 100 MG capsule Take 1 capsule (100 mg total) by mouth 2 (two) times daily. 12/30/18   Bonnell Public, MD  ezetimibe-simvastatin (VYTORIN) 10-40 MG tablet Take 1  tablet by mouth daily.    [provider]  furosemide (LASIX) 40 MG tablet Take 1 tablet (40 mg total) by mouth daily. 11/08/20   Hilty, Nadean Corwin, MD  hydrALAZINE (APRESOLINE) 25 MG tablet Take 2 tablet ( 50 mg ) every 8 hours 03/30/21   Hilty, Nadean Corwin, MD  insulin glargine (LANTUS) 100 UNIT/ML injection Inject 0.25 mLs (25 Units total) into the skin at bedtime. Patient taking differently: Inject 30 Units into the skin at bedtime. 07/06/19   Geradine Girt, DO  Insulin Glulisine (APIDRA SOLOSTAR) 100 UNIT/ML Solostar Pen Inject 10-15 Units into the skin 3 (three) times daily before meals.     [provider]  levothyroxine (SYNTHROID, LEVOTHROID) 25 MCG tablet take 1 tablet by mouth daily  07/13/17   Ann Held, DO  losartan (COZAAR) 50 MG tablet Take 1 tablet (50 mg total) by mouth daily. 11/08/20   Hilty, Nadean Corwin, MD  metoprolol tartrate (LOPRESSOR) 25 MG tablet Take 1 tablet (25 mg total) by mouth 2 (two) times daily. 11/08/20   Hilty, Nadean Corwin, MD  Multiple Vitamins-Minerals (CENTRUM SILVER ULTRA WOMENS PO) Take 1 tablet by mouth at bedtime.     [provider]  oxybutynin (DITROPAN) 5 MG tablet TAKE 1 TABLET BY MOUTH DAILY 04/08/21   Carollee Herter, Alferd Apa, DO  potassium chloride SA (KLOR-CON) 20 MEQ tablet TAKE 1 TABLET BY MOUTH DAILY AT BEDTIME 02/24/21   Hilty, Nadean Corwin, MD  spironolactone (ALDACTONE) 25 MG tablet TAKE 1/2 TABLETS BY MOUTH DAILY 09/24/20   Duke, Tami Lin, PA  sucralfate (CARAFATE) 1 g tablet Take 1 tablet (1 g total) by mouth 4 (four) times daily -  with meals and at bedtime. 12/02/19   Ann Held, DO  sulfamethoxazole-trimethoprim (BACTRIM DS) 800-160 MG tablet Take 1 tablet by mouth 2 (two) times daily for 7 days. 05/18/21 05/25/21  Wieters, Hallie C, PA-C  traMADol (ULTRAM) 50 MG tablet TAKE 1 TABLET BY MOUTH EVERY 6 HOURS AS NEEDED FOR MODERATE PAIN 12/27/20   Ann Held, DO    Allergies    Other, Cefuroxime  axetil, and Ciprofloxacin  Review of Systems   Review of Systems  Constitutional:  Negative for chills and fever.  HENT:  Negative for congestion, ear pain and sore throat.   Eyes:  Negative for pain and visual disturbance.  Respiratory:  Positive for shortness of breath. Negative for cough.   Cardiovascular:  Positive for leg swelling. Negative for chest pain and palpitations.  Gastrointestinal:  Negative for abdominal pain and vomiting.  Genitourinary:  Negative for dysuria and hematuria.  Musculoskeletal:  Negative for arthralgias and back pain.  Skin:  Negative for color change and rash.  Neurological:  Negative for seizures and syncope.  All other systems reviewed and are negative.  Physical Exam Updated Vital Signs BP (!) 117/46 (BP Location: Right Arm)   Pulse 66   Temp 97.7 F (36.5 C) (Oral)   Resp 20   Ht 1.6 m (_0 )   Wt 92.5 kg   LMP  (LMP Unknown)   SpO2 97%   BMI 36.14 kg/m   Physical Exam Vitals and nursing note reviewed.  Constitutional:      General: She is not in acute distress.    Appearance: Normal appearance. She is well-developed. She is not ill-appearing.  HENT:     Head: Normocephalic and atraumatic.  Eyes:     Conjunctiva/sclera: Conjunctivae normal.  Cardiovascular:     Rate and Rhythm: Normal rate and regular rhythm.     Heart sounds: No murmur heard. Pulmonary:     Effort: Pulmonary effort is normal. No respiratory distress.     Breath sounds: Normal breath sounds. No wheezing, rhonchi or rales.  Abdominal:     Palpations: Abdomen is soft.     Tenderness: There is no abdominal tenderness.  Musculoskeletal:     Cervical back: Neck supple.     Right lower leg: Edema present.     Left lower leg: Edema present.     Comments: Slight pitting edema.  Skin:    General: Skin is warm and dry.     Capillary Refill: Capillary refill takes less than 2 seconds.  Neurological:  General: No focal deficit present.     Mental Status: She is  alert and oriented to person, place, and time.    ED Results / Procedures / Treatments   Labs (all labs ordered are listed, but only abnormal results are displayed) Labs Reviewed  CBC WITH DIFFERENTIAL/PLATELET  COMPREHENSIVE METABOLIC PANEL  BRAIN NATRIURETIC PEPTIDE    EKG None  Radiology DG Chest Port 1 View  Result Date: 05/25/2021 CLINICAL DATA:  Shortness of breath EXAM: PORTABLE CHEST 1 VIEW COMPARISON:  07/10/2019, CT 12/01/2017 FINDINGS: Cardiomegaly with vascular congestion and mild interstitial pulmonary edema. No sizable effusion. No focal consolidation. Atelectasis or scarring in the left mid lung. Aortic atherosclerosis. No pneumothorax. IMPRESSION: Cardiomegaly with vascular congestion and mild interstitial edema Electronically Signed   By: Donavan Foil M.D.   On: 05/25/2021 15:41    Procedures Procedures   Medications Ordered in ED Medications - No data to display  ED Course  I have reviewed the triage vital signs and the nursing notes.  Pertinent labs & imaging results that were available during my care of the patient were reviewed by me and considered in my medical decision making (see chart for details).    MDM Rules/Calculators/A&P                          Clinically would presume patient is in congestive heart failure.  Dr. Debara Pickett felt the same thing yesterday by increasing her Lasix.  Patient previously was on 40 mg once a day.  He increased it to twice a day.  Patient's oxygen sats are good at rest.  On her oxygen.  Lungs are clear.  Chest x-ray does raise concerns for cardiomegaly with vascular congestion and mild interstitial edema.  But no florid pulmonary edema.  Patient denies any chest pain.  Patient has CBC comprehensive metabolic panel and BNP in process.  Have held giving any Lasix at this time since patient in no acute distress.  And we will see what findings we get on the labs.  Patient did not take any Lasix today however.   Final Clinical  Impression(s) / ED Diagnoses Final diagnoses:  SOB (shortness of breath)  Chronic diastolic congestive heart failure [facility])    Rx / DC Orders ED Discharge Orders     None        Fredia Sorrow, MD 05/25/21 [phone]

## 2021-05-26 ENCOUNTER — Inpatient Hospital Stay (HOSPITAL_COMMUNITY): Payer: Medicare Other

## 2021-05-26 ENCOUNTER — Telehealth: Payer: Self-pay

## 2021-05-26 DIAGNOSIS — I5033 Acute on chronic diastolic (congestive) heart failure: Secondary | ICD-10-CM

## 2021-05-26 DIAGNOSIS — E119 Type 2 diabetes mellitus without complications: Secondary | ICD-10-CM

## 2021-05-26 DIAGNOSIS — D696 Thrombocytopenia, unspecified: Secondary | ICD-10-CM

## 2021-05-26 DIAGNOSIS — K7581 Nonalcoholic steatohepatitis (NASH): Secondary | ICD-10-CM

## 2021-05-26 DIAGNOSIS — E039 Hypothyroidism, unspecified: Secondary | ICD-10-CM

## 2021-05-26 DIAGNOSIS — I10 Essential (primary) hypertension: Secondary | ICD-10-CM

## 2021-05-26 DIAGNOSIS — N1831 Chronic kidney disease, stage 3a: Secondary | ICD-10-CM

## 2021-05-26 DIAGNOSIS — Z794 Long term (current) use of insulin: Secondary | ICD-10-CM

## 2021-05-26 DIAGNOSIS — E785 Hyperlipidemia, unspecified: Secondary | ICD-10-CM

## 2021-05-26 DIAGNOSIS — J9611 Chronic respiratory failure with hypoxia: Secondary | ICD-10-CM

## 2021-05-26 DIAGNOSIS — I272 Pulmonary hypertension, unspecified: Secondary | ICD-10-CM

## 2021-05-26 DIAGNOSIS — I5031 Acute diastolic (congestive) heart failure: Secondary | ICD-10-CM

## 2021-05-26 DIAGNOSIS — Z905 Acquired absence of kidney: Secondary | ICD-10-CM

## 2021-05-26 DIAGNOSIS — N3281 Overactive bladder: Secondary | ICD-10-CM

## 2021-05-26 DIAGNOSIS — E669 Obesity, unspecified: Secondary | ICD-10-CM

## 2021-05-26 DIAGNOSIS — J9610 Chronic respiratory failure, unspecified whether with hypoxia or hypercapnia: Secondary | ICD-10-CM

## 2021-05-26 DIAGNOSIS — K7460 Unspecified cirrhosis of liver: Secondary | ICD-10-CM

## 2021-05-26 DIAGNOSIS — R0602 Shortness of breath: Secondary | ICD-10-CM

## 2021-05-26 LAB — CBC WITH DIFFERENTIAL/PLATELET
Abs Immature Granulocytes: 0.05 10*3/uL (ref 0.00–0.07)
Basophils Absolute: 0 10*3/uL (ref 0.0–0.1)
Basophils Relative: 1 %
Eosinophils Absolute: 0.1 10*3/uL (ref 0.0–0.5)
Eosinophils Relative: 1 %
HCT: 32.4 % — ABNORMAL LOW (ref 36.0–46.0)
Hemoglobin: 10.5 g/dL — ABNORMAL LOW (ref 12.0–15.0)
Immature Granulocytes: 1 %
Lymphocytes Relative: 12 %
Lymphs Abs: 0.8 10*3/uL (ref 0.7–4.0)
MCH: 31.1 pg (ref 26.0–34.0)
MCHC: 32.4 g/dL (ref 30.0–36.0)
MCV: 95.9 fL (ref 80.0–100.0)
Monocytes Absolute: 0.9 10*3/uL (ref 0.1–1.0)
Monocytes Relative: 13 %
Neutro Abs: 5.3 10*3/uL (ref 1.7–7.7)
Neutrophils Relative %: 72 %
Platelets: 102 10*3/uL — ABNORMAL LOW (ref 150–400)
RBC: 3.38 MIL/uL — ABNORMAL LOW (ref 3.87–5.11)
RDW: 13.2 % (ref 11.5–15.5)
WBC: 7.2 10*3/uL (ref 4.0–10.5)
nRBC: 0 % (ref 0.0–0.2)

## 2021-05-26 LAB — COMPREHENSIVE METABOLIC PANEL
ALT: 19 U/L (ref 0–44)
AST: 18 U/L (ref 15–41)
Albumin: 3.4 g/dL — ABNORMAL LOW (ref 3.5–5.0)
Alkaline Phosphatase: 50 U/L (ref 38–126)
Anion gap: 9 (ref 5–15)
BUN: 62 mg/dL — ABNORMAL HIGH (ref 8–23)
CO2: 24 mmol/L (ref 22–32)
Calcium: 9 mg/dL (ref 8.9–10.3)
Chloride: 103 mmol/L (ref 98–111)
Creatinine, Ser: 2.35 mg/dL — ABNORMAL HIGH (ref 0.44–1.00)
GFR, Estimated: 20 mL/min — ABNORMAL LOW (ref >60)
Glucose, Bld: 148 mg/dL — ABNORMAL HIGH (ref 70–99)
Potassium: 4.8 mmol/L (ref 3.5–5.1)
Sodium: 136 mmol/L (ref 135–145)
Total Bilirubin: 0.7 mg/dL (ref 0.3–1.2)
Total Protein: 6.4 g/dL — ABNORMAL LOW (ref 6.5–8.1)

## 2021-05-26 LAB — CBG MONITORING, ED
Glucose-Capillary: 160 mg/dL — ABNORMAL HIGH (ref 70–99)
Glucose-Capillary: 175 mg/dL — ABNORMAL HIGH (ref 70–99)
Glucose-Capillary: 218 mg/dL — ABNORMAL HIGH (ref 70–99)

## 2021-05-26 LAB — GLUCOSE, CAPILLARY
Glucose-Capillary: 197 mg/dL — ABNORMAL HIGH (ref 70–99)
Glucose-Capillary: 234 mg/dL — ABNORMAL HIGH (ref 70–99)

## 2021-05-26 LAB — ECHOCARDIOGRAM COMPLETE
Area-P 1/2: 2.99 cm2
Height: 63 in
S' Lateral: 3.2 cm
Weight: 3264 oz

## 2021-05-26 LAB — URINALYSIS, ROUTINE W REFLEX MICROSCOPIC
Bilirubin Urine: NEGATIVE
Glucose, UA: 50 mg/dL — AB
Hgb urine dipstick: NEGATIVE
Ketones, ur: NEGATIVE mg/dL
Leukocytes,Ua: NEGATIVE
Nitrite: NEGATIVE
Protein, ur: NEGATIVE mg/dL
Specific Gravity, Urine: 1.011 (ref 1.005–1.030)
pH: 5 (ref 5.0–8.0)

## 2021-05-26 LAB — TSH: TSH: 0.243 u[IU]/mL — ABNORMAL LOW (ref 0.350–4.500)

## 2021-05-26 LAB — MAGNESIUM: Magnesium: 2.3 mg/dL (ref 1.7–2.4)

## 2021-05-26 LAB — PHOSPHORUS: Phosphorus: 5 mg/dL — ABNORMAL HIGH (ref 2.5–4.6)

## 2021-05-26 MED ORDER — SIMVASTATIN 20 MG PO TABS
40.0000 mg | ORAL_TABLET | Freq: Every day | ORAL | Status: DC
  Administered 2021-05-26 – 2021-05-28 (×3): 40 mg via ORAL
  Filled 2021-05-26 (×3): qty 2

## 2021-05-26 MED ORDER — TRAMADOL HCL 50 MG PO TABS
50.0000 mg | ORAL_TABLET | Freq: Four times a day (QID) | ORAL | Status: DC | PRN
  Administered 2021-05-26: 50 mg via ORAL
  Filled 2021-05-26: qty 1

## 2021-05-26 MED ORDER — EZETIMIBE 10 MG PO TABS
10.0000 mg | ORAL_TABLET | Freq: Every day | ORAL | Status: DC
  Administered 2021-05-26 – 2021-05-29 (×4): 10 mg via ORAL
  Filled 2021-05-26 (×4): qty 1

## 2021-05-26 MED ORDER — ACETAMINOPHEN 325 MG PO TABS: 650.0000 mg | ORAL_TABLET | Freq: Four times a day (QID) | ORAL | Status: DC | PRN

## 2021-05-26 MED ORDER — ALBUTEROL SULFATE (2.5 MG/3ML) 0.083% IN NEBU
2.5000 mg | INHALATION_SOLUTION | RESPIRATORY_TRACT | Status: DC | PRN
  Administered 2021-05-26: 2.5 mg via RESPIRATORY_TRACT
  Filled 2021-05-26: qty 3

## 2021-05-26 MED ORDER — OXYBUTYNIN CHLORIDE 5 MG PO TABS
5.0000 mg | ORAL_TABLET | Freq: Every day | ORAL | Status: DC
  Administered 2021-05-26 – 2021-05-29 (×4): 5 mg via ORAL
  Filled 2021-05-26 (×4): qty 1

## 2021-05-26 MED ORDER — INSULIN GLARGINE 100 UNIT/ML ~~LOC~~ SOLN
25.0000 [IU] | Freq: Every day | SUBCUTANEOUS | Status: DC
  Administered 2021-05-26 – 2021-05-28 (×3): 25 [IU] via SUBCUTANEOUS
  Filled 2021-05-26 (×4): qty 0.25

## 2021-05-26 MED ORDER — METOPROLOL TARTRATE 25 MG PO TABS
25.0000 mg | ORAL_TABLET | Freq: Two times a day (BID) | ORAL | Status: DC
  Administered 2021-05-26 – 2021-05-29 (×7): 25 mg via ORAL
  Filled 2021-05-26 (×8): qty 1

## 2021-05-26 MED ORDER — SODIUM CHLORIDE 0.9 % IV SOLN: 250.0000 mL | INTRAVENOUS | Status: DC | PRN

## 2021-05-26 MED ORDER — SODIUM CHLORIDE 0.9% FLUSH: 3.0000 mL | INTRAVENOUS | Status: DC | PRN

## 2021-05-26 MED ORDER — SODIUM CHLORIDE 0.9% FLUSH
3.0000 mL | Freq: Two times a day (BID) | INTRAVENOUS | Status: DC
  Administered 2021-05-26 – 2021-05-29 (×8): 3 mL via INTRAVENOUS

## 2021-05-26 MED ORDER — LEVOTHYROXINE SODIUM 25 MCG PO TABS
25.0000 ug | ORAL_TABLET | Freq: Every day | ORAL | Status: DC
  Administered 2021-05-26 – 2021-05-29 (×4): 25 ug via ORAL
  Filled 2021-05-26 (×4): qty 1

## 2021-05-26 MED ORDER — ACETAMINOPHEN 650 MG RE SUPP: 650.0000 mg | Freq: Four times a day (QID) | RECTAL | Status: DC | PRN

## 2021-05-26 MED ORDER — HYDROCODONE-ACETAMINOPHEN 5-325 MG PO TABS: 1.0000 | ORAL_TABLET | ORAL | Status: DC | PRN

## 2021-05-26 MED ORDER — LEVOTHYROXINE SODIUM 25 MCG PO TABS: 25.0000 ug | ORAL_TABLET | Freq: Every day | ORAL | Status: DC

## 2021-05-26 MED ORDER — ASPIRIN 81 MG PO CHEW
81.0000 mg | CHEWABLE_TABLET | Freq: Every day | ORAL | Status: DC
  Administered 2021-05-26 – 2021-05-29 (×4): 81 mg via ORAL
  Filled 2021-05-26 (×4): qty 1

## 2021-05-26 MED ORDER — FUROSEMIDE 10 MG/ML IJ SOLN
40.0000 mg | Freq: Two times a day (BID) | INTRAMUSCULAR | Status: DC
  Administered 2021-05-26 – 2021-05-27 (×3): 40 mg via INTRAVENOUS
  Filled 2021-05-26 (×3): qty 4

## 2021-05-26 MED ORDER — EZETIMIBE-SIMVASTATIN 10-40 MG PO TABS: 1.0000 | ORAL_TABLET | Freq: Every day | ORAL | Status: DC

## 2021-05-26 NOTE — Telephone Encounter (Signed)
Patient

## 2021-05-26 NOTE — ED Notes (Signed)
Placed another external cath patient is resting with call bell in reach and family at bedside

## 2021-05-26 NOTE — Progress Notes (Addendum)
PROGRESS NOTE    Margaret Dyer  VQQ:[id number] DOB: Nov 07, 1935 DOA: 05/25/2021 PCP: Ann Held, DO   Brief Narrative:  HPI On 05/25/2021 by Dr. Toy Baker Margaret Dyer is a 86 y.o. female with medical history significant of  thrombocytopenia, DM2, HTN, CKD, chronic diastolic CHF , HLD, Liver Cirrhosis/NASH, pulmoanry HTN, renal cell CA sp nephrectomy Right heart failure,hypothyroidism, chronic respiratory failure on O2 2L, tobacco abuse    Presented with SOB for the past 4 days probably was going on a bit longer than that.  No chest pain associated she increase her oxygen to 2.75 L from 2.  She has not had any fever or cold symptoms. Patient contacted her primary cardiologist Dr. Debara Pickett who recommended increasing Lasix to 40 twice daily for 3 days she did take 1 extra dose but still short of breath and presented to emergency department. Per family she could barely finish a full sentence.  She has been having some swelling of her ankles as well as midsection.   Of note on June 15 patient was seen at urgent care and was treated with Bactrim for UTI.  Interim history Patient admitted with shortness of breath and CHF exacerbation.  Also found to have acute kidney injury.  Nephrology and cardiology consulted and appreciated.  Currently on IV Lasix. Assessment & Plan   Acute diastolic CHF exacerbation -Patient presenting with progressive shortness of breath and increased oxygen need -Chest x-ray independently reviewed, showed interstitial edema and vascular congestion -BNP 297.7 -Echocardiogram pending -Patient placed on IV Lasix 40 mg twice daily -Cardiology consulted and appreciated -Monitor intake and output, daily weights  Acute kidney injury on chronic kidney disease, stage IIIb/solitary kidney with history of renal cell carcinoma -Possibly secondary to medications -Patient was recently on Bactrim for urinary tract infection as well as diuretics and and  Cozaar -Nephrology consulted and appreciated given that patient has a solitary kidney -Continue to monitor BMP  Essential hypertension -BP somewhat soft -Amlodipine and hydralazine held -Continue IV lasix  Diabetes mellitus, type II -Hemoglobin A1c 6.3 -Continue insulin sliding scale and CBG monitoring  Pulmonary hypertension with chronic hypoxic respiratory failure -Patient uses 2 L of home oxygen at baseline  Overactive bladder -Continue Ditropan  Thrombocytopenia -Chronic and stable  Liver cirrhosis/NASH -Given worsening creatinine, spironolactone held  Obesity -BMI 36.14 -Patient wanting to follow-up with her PCP for lifestyle modification  Hypothyroidism -TSH 0.243- low- however given acute illness, will not make a medication adjustments. -Patient should follow-up with her PCP for repeat thyroid function studies as an outpatient -Continue synthroid   DVT Prophylaxis  SCDs  Code Status: Full  Family Communication: None at bedside  Disposition Plan:  Status is: Inpatient  Remains inpatient appropriate because:IV treatments appropriate due to intensity of illness or inability to take PO and Inpatient level of care appropriate due to severity of illness  Dispo: The patient is from: Home              Anticipated d/c is to:  TBD              Patient currently is not medically stable to d/c.   Difficult to place patient No   Consultants Cardiology Nephrology  Procedures  Echocardiogram  Antibiotics   Anti-infectives (From admission, onward)    None       Subjective:   Margaret Dyer seen and examined today.  Complains of shortness of breath, states she is unable to get up and move around her  house.  Denies current chest pain.  Denies abdominal pain, nausea or vomiting, dizziness or headache.  States she has been short of breath for several days and only had gets relief when laying down.    Objective:   Vitals:   05/26/21 0600 05/26/21 0740 05/26/21  1000 05/26/21 1035  BP: (!) 131/55  (!) 129/50 (!) 128/50  Pulse: 61  62 60  Resp: _0 Temp:  98.2 F (36.8 C)    TempSrc:  Oral    SpO2: 94%  95% 95%  Weight:      Height:        Intake/Output Summary (Last 24 hours) at 05/26/2021 1122 Last data filed at 05/26/2021 1059 Gross per 24 hour  Intake --  Output 700 ml  Net -700 ml   Filed Weights   05/25/21 1417  Weight: 92.5 kg    Exam General: Well developed, chronically ill-appearing, NAD HEENT: NCAT, mucous membranes moist.  Cardiovascular: S1 S2 auscultated, RRR Respiratory: Diminished breath sounds, no wheezing Abdomen: Soft, obese, nontender, nondistended, + bowel sounds Extremities: warm dry without cyanosis clubbing or edema Neuro: AAOx3, nonfocal Psych: Normal affect and demeanor with intact judgement and insight   Data Reviewed: I have personally reviewed following labs and imaging studies  CBC: Recent Labs  Lab 05/25/21 1523 05/26/21 0445  WBC 6.1 7.2  NEUTROABS 4.6 5.3  HGB 10.1* 10.5*  HCT 31.5* 32.4*  MCV 94.3 95.9  PLT 92* 563*   Basic Metabolic Panel: Recent Labs  Lab 05/25/21 1523 05/26/21 0445  NA 134* 136  K 5.1 4.8  CL 100 103  CO2 25 24  GLUCOSE 154* 148*  BUN 57* 62*  CREATININE 2.54* 2.35*  CALCIUM 8.7* 9.0  MG  --  2.3  PHOS  --  5.0*   GFR: Estimated Creatinine Clearance: 19.2 mL/min (A) (by C-G formula based on SCr of 2.35 mg/dL (H)). Liver Function Tests: Recent Labs  Lab 05/25/21 1523 05/26/21 0445  AST 17 18  ALT 17 19  ALKPHOS 48 50  BILITOT 0.8 0.7  PROT 6.1* 6.4*  ALBUMIN 3.3* 3.4*   No results for input(s): LIPASE, AMYLASE in the last 168 hours. No results for input(s): AMMONIA in the last 168 hours. Coagulation Profile: Recent Labs  Lab 05/25/21 1927  INR 1.2   Cardiac Enzymes: No results for input(s): CKTOTAL, CKMB, CKMBINDEX, TROPONINI in the last 168 hours. BNP (last 3 results) No results for input(s): PROBNP in the last 8760  hours. HbA1C: Recent Labs    05/25/21 1927  HGBA1C 6.3*   CBG: Recent Labs  Lab 05/25/21 2032 05/26/21 0420 05/26/21 0820  GLUCAP 181* 160* 175*   Lipid Profile: No results for input(s): CHOL, HDL, LDLCALC, TRIG, CHOLHDL, LDLDIRECT in the last 72 hours. Thyroid Function Tests: Recent Labs    05/26/21 0445  TSH 0.243*   Anemia Panel: No results for input(s): VITAMINB12, FOLATE, FERRITIN, TIBC, IRON, RETICCTPCT in the last 72 hours. Urine analysis:    Component Value Date/Time   COLORURINE YELLOW 05/25/2021 1927   APPEARANCEUR CLOUDY (A) 05/25/2021 1927   LABSPEC 1.011 05/25/2021 1927   PHURINE 5.0 05/25/2021 1927   GLUCOSEU NEGATIVE 05/25/2021 1927   GLUCOSEU NEGATIVE 07/21/2019 1614   HGBUR NEGATIVE 05/25/2021 1927   HGBUR large 02/17/2011 [place] 05/25/2021 1927   BILIRUBINUR negative 05/18/2021 1935   BILIRUBINUR neg 01/31/2019 [place] 05/25/2021 [place] NEGATIVE 05/25/2021 1927  UROBILINOGEN 0.2 05/18/2021 1935   UROBILINOGEN 0.2 07/21/2019 1614   NITRITE NEGATIVE 05/25/2021 1927   LEUKOCYTESUR NEGATIVE 05/25/2021 1927   Sepsis Labs: _0 (procalcitonin:4,lacticidven:4)  ) Recent Results (from the past 240 hour(s))  Urine Culture     Status: Abnormal   Collection Time: 05/18/21  7:43 PM   Specimen: Urine, Random  Result Value Ref Range Status   Specimen Description URINE, RANDOM  Final   Special Requests   Final    NONE Performed at [facility], [place] [address], [place], [place] [address]    Culture >=100,000 COLONIES/mL KLEBSIELLA PNEUMONIAE (A)  Final   Report Status 05/22/2021 FINAL  Final   Organism ID, Bacteria KLEBSIELLA PNEUMONIAE (A)  Final      Susceptibility   Klebsiella pneumoniae - MIC*    AMPICILLIN RESISTANT Resistant     CEFAZOLIN <=4 SENSITIVE Sensitive     CEFEPIME <=0.12 SENSITIVE Sensitive     CEFTRIAXONE <=0.25 SENSITIVE Sensitive     CIPROFLOXACIN <=0.25 SENSITIVE  Sensitive     GENTAMICIN <=1 SENSITIVE Sensitive     IMIPENEM <=0.25 SENSITIVE Sensitive     NITROFURANTOIN 32 SENSITIVE Sensitive     TRIMETH/SULFA <=20 SENSITIVE Sensitive     AMPICILLIN/SULBACTAM <=2 SENSITIVE Sensitive     PIP/TAZO <=4 SENSITIVE Sensitive     * >=100,000 COLONIES/mL KLEBSIELLA PNEUMONIAE  SARS CORONAVIRUS 2 (TAT 6-24 HRS) Nasopharyngeal Nasopharyngeal Swab     Status: None   Collection Time: 05/25/21  6:18 PM   Specimen: Nasopharyngeal Swab  Result Value Ref Range Status   SARS Coronavirus 2 NEGATIVE NEGATIVE Final    Comment: (NOTE) SARS-CoV-2 target nucleic acids are NOT DETECTED.  The SARS-CoV-2 RNA is generally detectable in upper and lower respiratory specimens during the acute phase of infection. Negative results do not preclude SARS-CoV-2 infection, do not rule out co-infections with other pathogens, and should not be used as the sole basis for treatment or other patient management decisions. Negative results must be combined with clinical observations, patient history, and epidemiological information. The expected result is Negative.  Fact Sheet for Patients: SugarRoll.be  Fact Sheet for Healthcare Providers: https://www.woods-mathews.com/  This test is not yet approved or cleared by the [place] FDA and  has been authorized for detection and/or diagnosis of SARS-CoV-2 by FDA under an Emergency Use Authorization (EUA). This EUA will remain  in effect (meaning this test can be used) for the duration of the COVID-19 declaration under Se ction 564(b)(1) of the Act, 21 U.S.C. section 360bbb-3(b)(1), unless the authorization is terminated or revoked sooner.  Performed at [facility], [place] [address], [place], [place] [address]       Radiology Studies: DG Chest Port 1 View  Result Date: 05/25/2021 CLINICAL DATA:  Shortness of breath EXAM: PORTABLE CHEST 1 VIEW COMPARISON:  07/10/2019, CT  12/01/2017 FINDINGS: Cardiomegaly with vascular congestion and mild interstitial pulmonary edema. No sizable effusion. No focal consolidation. Atelectasis or scarring in the left mid lung. Aortic atherosclerosis. No pneumothorax. IMPRESSION: Cardiomegaly with vascular congestion and mild interstitial edema Electronically Signed   By: Donavan Foil M.D.   On: 05/25/2021 15:41     Scheduled Meds:  aspirin  81 mg Oral Daily   ezetimibe  10 mg Oral Daily   And   simvastatin  40 mg Oral q1800   furosemide  40 mg Intravenous BID   insulin aspart  0-9 Units Subcutaneous Q4H   insulin glargine  25 Units Subcutaneous QHS  levothyroxine  25 mcg Oral Q0600   metoprolol tartrate  25 mg Oral BID   oxybutynin  5 mg Oral Daily   sodium chloride flush  3 mL Intravenous Q12H   Continuous Infusions:  sodium chloride       LOS: 1 day   Time Spent in minutes   45 minutes  Rory Xiang D.O. on 05/26/2021 at 11:22 AM  Between 7am to 7pm - Please see pager noted on [email or web address]  After 7pm go to [email or web address]  And look for the night coverage person covering for me after hours  [facility] Office  [phone]

## 2021-05-26 NOTE — ED Notes (Signed)
Echo at [facility]

## 2021-05-26 NOTE — ED Notes (Addendum)
Discussed availability of PRN albuterol tx.  Pt asked to wait on that for now. Pt sitting up on side of bed drinking water at this time.

## 2021-05-26 NOTE — ED Notes (Signed)
Checked patient cbg it was 7 notified RN John of blood sugar patient is resting with call bell in reach

## 2021-05-26 NOTE — Telephone Encounter (Signed)
Patient's daughter called stating patient is in hospital and was recommended to have a referral placed for pulmonology. She would like recommendations on who to see on a regular bases rather than just seeing pulmonology in the hospital.

## 2021-05-26 NOTE — Evaluation (Signed)
Physical Therapy Evaluation Patient Details Name: Margaret Dyer MRN: [id number] DOB: 04/05/1935 Today's Date: 05/26/2021   History of Present Illness  Pt is an 86 y/o female admitted 6/22 secondary to increased SOB and LE swelling. Thought to be secondary to CHF exacerbation. Pt also found to have AKI. PMH includes HTN, DM, CKD, CHF, pulmonary HTN, renal cell carcinoma s/p nephrectomy.  Clinical Impression  Pt admitted secondary to problem above with deficits below. Pt requiring min A for bed mobility and min guard A for safety to perform transfers. Pt oxygen sats decreasing to 88% on 2.5 L, but returned to >90% on 2.5 L with seated rest. Pt reports she alternates staying between her daughters homes normally and reports they are available to assist as needed. Recommending HHPT at d/c to increase independence and safety with functional mobility. Will continue to follow acutely.     Follow Up Recommendations Home health PT;Supervision for mobility/OOB    Equipment Recommendations  None recommended by PT    Recommendations for Other Services       Precautions / Restrictions Precautions Precautions: Fall Restrictions Weight Bearing Restrictions: No      Mobility  Bed Mobility Overal bed mobility: Needs Assistance Bed Mobility: Supine to Sit;Sit to Supine     Supine to sit: Min assist Sit to supine: Min assist   General bed mobility comments: Required assist for trunk and LE assist. Increased time required.    Transfers Overall transfer level: Needs assistance Equipment used: Rolling walker (2 wheeled) Transfers: Sit to/from [facility] Sit to Stand: Min guard Stand pivot transfers: Min guard       General transfer comment: min guard A For safety to stand and transfer to/from chair from stretcher in order to change sheets. Oxygen sats dropping to 88% on 2.5 L. Quickly returning to >90% on 2.5 L with seated rest.  Ambulation/Gait                 Stairs            Wheelchair Mobility    Modified Rankin (Stroke Patients Only)       Balance Overall balance assessment: Needs assistance Sitting-balance support: No upper extremity supported;Feet supported Sitting balance-Leahy Scale: Good     Standing balance support: Bilateral upper extremity supported;During functional activity Standing balance-Leahy Scale: Poor Standing balance comment: Reliant on BUE support                             Pertinent Vitals/Pain Pain Assessment: No/denies pain    Home Living Family/patient expects to be discharged to:: Private residence Living Arrangements: Children Available Help at Discharge: Family;Available 24 hours/day Type of Home: House Home Access: Stairs to enter Entrance Stairs-Rails: Right;Left (at older daughters home and none at younger daughters home) Technical brewer of Steps: 4 at older daughters home and 2 at younger daughters home Home Layout: One level Home Equipment: Grab bars - tub/shower;Walker - 4 wheels;Walker - 2 wheels;Tub bench Additional Comments: Alternates Between daughters houses    Prior Function Level of Independence: Needs assistance   Gait / Transfers Assistance Needed: Uses rollator for ambulation; needs assist with steps  ADL's / Homemaking Assistance Needed: Reports occasional assist with ADL tasks.        Hand Dominance        Extremity/Trunk Assessment   Upper Extremity Assessment Upper Extremity Assessment: Defer to OT evaluation    Lower Extremity Assessment Lower Extremity  Assessment: Generalized weakness    Cervical / Trunk Assessment Cervical / Trunk Assessment: Kyphotic  Communication   Communication: No difficulties  Cognition Arousal/Alertness: Awake/alert Behavior During Therapy: WFL for tasks assessed/performed Overall Cognitive Status: Within Functional Limits for tasks assessed                                         General Comments General comments (skin integrity, edema, etc.): Pt's daughter present during session    Exercises     Assessment/Plan    PT Assessment Patient needs continued PT services  PT Problem List Decreased strength;Decreased activity tolerance;Decreased balance;Decreased mobility;Decreased knowledge of use of DME;Decreased knowledge of precautions;Cardiopulmonary status limiting activity       PT Treatment Interventions DME instruction;Gait training;Stair training;Functional mobility training;Therapeutic activities;Therapeutic exercise;Balance training;Patient/family education    PT Goals (Current goals can be found in the Care Plan section)  Acute Rehab PT Goals Patient Stated Goal: to go home PT Goal Formulation: With patient Time For Goal Achievement: 06/09/21 Potential to Achieve Goals: Good    Frequency Min 3X/week   Barriers to discharge        Co-evaluation               AM-PAC PT "6 Clicks" Mobility  Outcome Measure Help needed turning from your back to your side while in a flat bed without using bedrails?: None Help needed moving from lying on your back to sitting on the side of a flat bed without using bedrails?: A Little Help needed moving to and from a bed to a chair (including a wheelchair)?: A Little Help needed standing up from a chair using your arms (e.g., wheelchair or bedside chair)?: A Little Help needed to walk in hospital room?: A Little Help needed climbing 3-5 steps with a railing? : A Lot 6 Click Score: 18    End of Session Equipment Utilized During Treatment: Gait belt Activity Tolerance: Patient limited by fatigue Patient left: in bed;with call bell/phone within reach;with family/visitor present (on stretcher in ED) Nurse Communication: Mobility status PT Visit Diagnosis: Unsteadiness on feet (R26.81);Muscle weakness (generalized) (M62.81)    Time: 6922-3009 PT Time Calculation (min) (ACUTE ONLY): 18 min   Charges:   PT  Evaluation $PT Eval Moderate Complexity: 1 Mod          Margaret Dyer, PT, DPT  Acute Rehabilitation Services  Pager: [phone] Office: [phone]   Rudean Hitt 05/26/2021, 4:03 PM
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
## 2021-05-26 NOTE — Progress Notes (Signed)
  Echocardiogram 2D Echocardiogram has been performed.  Fidel Levy 05/26/2021, 12:10 PM

## 2021-05-26 NOTE — ED Notes (Signed)
MD at [facility]. Family at [facility].

## 2021-05-26 NOTE — ED Notes (Signed)
Checked patient cbg it was 26 notified RN of blood sugar patient is resting with call bell in reach

## 2021-05-26 NOTE — ED Notes (Signed)
Walked patient to the bathroom with a walker patient did ok patient stated that she was sob I walked her with her oxygen and pulse oxy it went down as loas 88 on 2 liters then it went back up to 95 patient is now back in bed with call bell in reach

## 2021-05-26 NOTE — Progress Notes (Signed)
Patient had concern about home medications that she has not received in the ED, specifically cardiac medications this morning. RN verbalize to the patient that providers could make minor changes to medications during hospitalization but a representative from pharmacy would come to do home medication review. Patient would appreciate visit from pharmacy. Patient also had questions about her home medication Vytorin and the split of zetia and simvistatin here while inpatient. RN educated patient that Vytorin is a combination of zetia and simvastatin and she received zetia in the morning while in the ED and RN is giving her simvastatin during evening to get the full dose. Patient verbalize understanding.
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
## 2021-05-27 LAB — GLUCOSE, CAPILLARY
Glucose-Capillary: 119 mg/dL — ABNORMAL HIGH (ref 70–99)
Glucose-Capillary: 168 mg/dL — ABNORMAL HIGH (ref 70–99)
Glucose-Capillary: 183 mg/dL — ABNORMAL HIGH (ref 70–99)
Glucose-Capillary: 217 mg/dL — ABNORMAL HIGH (ref 70–99)
Glucose-Capillary: 222 mg/dL — ABNORMAL HIGH (ref 70–99)
Glucose-Capillary: 269 mg/dL — ABNORMAL HIGH (ref 70–99)

## 2021-05-27 LAB — CBC
HCT: 30.6 % — ABNORMAL LOW (ref 36.0–46.0)
Hemoglobin: 10.1 g/dL — ABNORMAL LOW (ref 12.0–15.0)
MCH: 30.8 pg (ref 26.0–34.0)
MCHC: 33 g/dL (ref 30.0–36.0)
MCV: 93.3 fL (ref 80.0–100.0)
Platelets: 101 10*3/uL — ABNORMAL LOW (ref 150–400)
RBC: 3.28 MIL/uL — ABNORMAL LOW (ref 3.87–5.11)
RDW: 12.9 % (ref 11.5–15.5)
WBC: 5.6 10*3/uL (ref 4.0–10.5)
nRBC: 0 % (ref 0.0–0.2)

## 2021-05-27 LAB — LIPID PANEL
Cholesterol: 143 mg/dL (ref 0–200)
HDL: 31 mg/dL — ABNORMAL LOW (ref >40)
LDL Cholesterol: 87 mg/dL (ref 0–99)
Total CHOL/HDL Ratio: 4.6 RATIO
Triglycerides: 124 mg/dL (ref <150)
VLDL: 25 mg/dL (ref 0–40)

## 2021-05-27 LAB — RENAL FUNCTION PANEL
Albumin: 3.2 g/dL — ABNORMAL LOW (ref 3.5–5.0)
Anion gap: 10 (ref 5–15)
BUN: 69 mg/dL — ABNORMAL HIGH (ref 8–23)
CO2: 30 mmol/L (ref 22–32)
Calcium: 9.2 mg/dL (ref 8.9–10.3)
Chloride: 96 mmol/L — ABNORMAL LOW (ref 98–111)
Creatinine, Ser: 2.31 mg/dL — ABNORMAL HIGH (ref 0.44–1.00)
GFR, Estimated: 20 mL/min — ABNORMAL LOW (ref >60)
Glucose, Bld: 221 mg/dL — ABNORMAL HIGH (ref 70–99)
Phosphorus: 4.6 mg/dL (ref 2.5–4.6)
Potassium: 4.6 mmol/L (ref 3.5–5.1)
Sodium: 136 mmol/L (ref 135–145)

## 2021-05-27 MED ORDER — TORSEMIDE 20 MG PO TABS
20.0000 mg | ORAL_TABLET | Freq: Every day | ORAL | Status: DC
  Administered 2021-05-28 – 2021-05-29 (×2): 20 mg via ORAL
  Filled 2021-05-27 (×2): qty 1

## 2021-05-27 NOTE — Plan of Care (Signed)
  Problem: Activity: Goal: Risk for activity intolerance will decrease Outcome: Progressing  Walking easily exerted

## 2021-05-27 NOTE — Progress Notes (Signed)
[facility] ROUNDING NOTE   [place]:   Interval History: This is an 86 year old lady with past medical history of diabetes mellitus type 2 hypertension chronic kidney disease stage III, pulmonary hypertension renal cell carcinoma status post nephrectomy 2000.  She has a history of congestive heart failure with diastolic dysfunction.  She had a 2D echo performed in 2018.  Echo has been ordered for 05/26/2021.  Last right-sided heart catheterization was performed in 2018.  Her cardiologist Dr. Glenetta Hew.  She presented to the emergency room with increasing shortness of breath and lower extremity swelling.  Baseline creatinine about 1.5 mg/dL  Blood pressure 134/55 pulse 60 temperature 98.2 O2 sats 99% nasal cannula  136 potassium 4.6 chloride 96 CO2 30 BUN 69 creatinine 2.3 glucose 221 calcium 9.2 albumin 3.2 hemoglobin 10.1  Demadex 20 mg daily   Objective:  Vital signs in last 24 hours:  Temp:  [98.1 F (36.7 C)-98.8 F (37.1 C)] 98.2 F (36.8 C) (06/24 1143) Pulse Rate:  [57-72] 59 (06/24 1143) Resp:  [16-20] 19 (06/24 1143) BP: (112-143)/(38-84) 134/55 (06/24 1143) SpO2:  [95 %-99 %] 99 % (06/24 1143) Weight:  [90.7 kg-92 kg] 90.7 kg (06/24 0306)  Weight change: -0.499 kg Filed Weights   05/25/21 1417 05/26/21 1530 05/27/21 0306  Weight: 92.5 kg 92 kg 90.7 kg    Intake/Output: I/O last 3 completed shifts: In: 340 [P.O.:340] Out: 1400 [Urine:1400]   Intake/Output this shift:  Total I/O In: 477 [P.O.:477] Out: 300 [Urine:300]  General:   Alert,  Well-developed, nondistressed chronically ill Head:  Normocephalic and atraumatic. Eyes:  Sclera clear, no icterus.   Conjunctiva pink.  Moist mucous membranes Ears:  Normal auditory acuity. Nose:  No deformity, discharge,  or lesions. Mouth:  No deformity or lesions, dentition normal. Neck:  Supple; no masses or thyromegaly. JVP not elevated Lungs:  Clear throughout to auscultation.   No wheezes,  crackles, or rhonchi. No acute distress. Heart:  Regular rate and rhythm; no murmurs, clicks, rubs,  or gallops. Abdomen:  Soft, nontender and nondistended. No masses, hepatosplenomegaly or hernias noted. Normal bowel sounds, without guarding, and without rebound.   Msk:  Symmetrical without gross deformities. Normal posture. Pulses:  No carotid, renal, femoral bruits. DP   Basic Metabolic Panel: Recent Labs  Lab 05/25/21 1523 05/26/21 0445 05/27/21 0322  NA 134* 136 136  K 5.1 4.8 4.6  CL 100 103 96*  CO2 _0 GLUCOSE 154* 148* 221*  BUN 57* 62* 69*  CREATININE 2.54* 2.35* 2.31*  CALCIUM 8.7* 9.0 9.2  MG  --  2.3  --   PHOS  --  5.0* 4.6    Liver Function Tests: Recent Labs  Lab 05/25/21 1523 05/26/21 0445 05/27/21 0322  AST 17 18  --   ALT 17 19  --   ALKPHOS 48 50  --   BILITOT 0.8 0.7  --   PROT 6.1* 6.4*  --   ALBUMIN 3.3* 3.4* 3.2*   No results for input(s): LIPASE, AMYLASE in the last 168 hours. No results for input(s): AMMONIA in the last 168 hours.  CBC: Recent Labs  Lab 05/25/21 1523 05/26/21 0445 05/27/21 0322  WBC 6.1 7.2 5.6  NEUTROABS 4.6 5.3  --   HGB 10.1* 10.5* 10.1*  HCT 31.5* 32.4* 30.6*  MCV 94.3 95.9 93.3  PLT 92* 102* 101*    Cardiac Enzymes: No results for input(s): CKTOTAL, CKMB, CKMBINDEX, TROPONINI in the last 168 hours.  BNP:  Invalid input(s): POCBNP  CBG: Recent Labs  Lab 05/26/21 2021 05/27/21 0019 05/27/21 0426 05/27/21 0812 05/27/21 1144  GLUCAP 234* 119* 217* 168* 183*    Microbiology: Results for orders placed or performed during the hospital encounter of 05/25/21  SARS CORONAVIRUS 2 (TAT 6-24 HRS) Nasopharyngeal Nasopharyngeal Swab     Status: None   Collection Time: 05/25/21  6:18 PM   Specimen: Nasopharyngeal Swab  Result Value Ref Range Status   SARS Coronavirus 2 NEGATIVE NEGATIVE Final    Comment: (NOTE) SARS-CoV-2 target nucleic acids are NOT DETECTED.  The SARS-CoV-2 RNA is generally  detectable in upper and lower respiratory specimens during the acute phase of infection. Negative results do not preclude SARS-CoV-2 infection, do not rule out co-infections with other pathogens, and should not be used as the sole basis for treatment or other patient management decisions. Negative results must be combined with clinical observations, patient history, and epidemiological information. The expected result is Negative.  Fact Sheet for Patients: SugarRoll.be  Fact Sheet for Healthcare Providers: https://www.woods-mathews.com/  This test is not yet approved or cleared by the [place] FDA and  has been authorized for detection and/or diagnosis of SARS-CoV-2 by FDA under an Emergency Use Authorization (EUA). This EUA will remain  in effect (meaning this test can be used) for the duration of the COVID-19 declaration under Se ction 564(b)(1) of the Act, 21 U.S.C. section 360bbb-3(b)(1), unless the authorization is terminated or revoked sooner.  Performed at [facility], [place] [address], [place], [place] [address]     Coagulation Studies: Recent Labs    05/25/21 1927  LABPROT 15.6*  INR 1.2    Urinalysis: Recent Labs    05/25/21 1927 05/26/21 1205  COLORURINE YELLOW YELLOW  LABSPEC 1.011 1.011  PHURINE 5.0 5.0  GLUCOSEU NEGATIVE 50*  HGBUR NEGATIVE NEGATIVE  BILIRUBINUR NEGATIVE NEGATIVE  KETONESUR NEGATIVE NEGATIVE  PROTEINUR NEGATIVE NEGATIVE  NITRITE NEGATIVE NEGATIVE  LEUKOCYTESUR NEGATIVE NEGATIVE      Imaging: US RENAL  Result Date: 05/26/2021 CLINICAL DATA:  Acute renal insufficiency.  Prior left nephrectomy. EXAM: RENAL / URINARY TRACT ULTRASOUND COMPLETE COMPARISON:  CT 07/02/2019. FINDINGS: Right Kidney: Renal measurements: 14.6 x 5.5 x 5.4 cm = volume: 227.8 mL. Cortical thinning. Echogenicity within normal limits. No mass or hydronephrosis visualized. Left Kidney: Renal measurements: Prior  nephrectomy. Bladder: Appears normal for degree of bladder distention. Right ureteral jet not identified. Other: None. IMPRESSION: 1. Right renal cortical thinning. No hydronephrosis or bladder distention. Right ureteral jet not identified. 2.  Prior left nephrectomy. Electronically Signed   By: Marcello Moores  Register   On: 05/26/2021 13:38   DG Chest Port 1 View  Result Date: 05/25/2021 CLINICAL DATA:  Shortness of breath EXAM: PORTABLE CHEST 1 VIEW COMPARISON:  07/10/2019, CT 12/01/2017 FINDINGS: Cardiomegaly with vascular congestion and mild interstitial pulmonary edema. No sizable effusion. No focal consolidation. Atelectasis or scarring in the left mid lung. Aortic atherosclerosis. No pneumothorax. IMPRESSION: Cardiomegaly with vascular congestion and mild interstitial edema Electronically Signed   By: Donavan Foil M.D.   On: 05/25/2021 15:41   ECHOCARDIOGRAM COMPLETE  Result Date: 05/26/2021    ECHOCARDIOGRAM REPORT   Patient Name:   MARKALA SITTS Date of Exam: 05/26/2021 Medical Rec #:  [id number]     Height:       63.0 in Accession #:    [id number]    Weight:       204.0 lb Date of Birth:  1935-01-13  BSA:          1.950 m Patient Age:    79 years      BP:           139/59 mmHg Patient Gender: F             HR:           65 bpm. Exam Location:  Inpatient Procedure: 2D Echo, Color Doppler and Cardiac Doppler Indications:    CHF-Acute Diastolic K82.06  History:        Patient has prior history of Echocardiogram examinations, most                 recent 05/13/2017. CHF, Pulmonary HTN; Risk Factors:Diabetes,                 Dyslipidemia and Hypertension.  Sonographer:    Bernadene Person RDCS Referring Phys: [place]  1. Left ventricular ejection fraction, by estimation, is 60 to 65%. The left ventricle has normal function. The left ventricle has no regional wall motion abnormalities. Left ventricular diastolic parameters are consistent with Grade II diastolic dysfunction  (pseudonormalization).  2. Right ventricular systolic function is normal. The right ventricular size is normal. Tricuspid regurgitation signal is inadequate for assessing PA pressure.  3. The mitral valve is normal in structure. Trivial mitral valve regurgitation. No evidence of mitral stenosis.  4. The aortic valve is tricuspid. Aortic valve regurgitation is not visualized. No aortic stenosis is present.  5. The inferior vena cava is normal in size with greater than 50% respiratory variability, suggesting right atrial pressure of 3 mmHg. FINDINGS  Left Ventricle: Left ventricular ejection fraction, by estimation, is 60 to 65%. The left ventricle has normal function. The left ventricle has no regional wall motion abnormalities. The left ventricular internal cavity size was normal in size. There is  no left ventricular hypertrophy. Left ventricular diastolic parameters are consistent with Grade II diastolic dysfunction (pseudonormalization). Right Ventricle: The right ventricular size is normal. Right ventricular systolic function is normal. Tricuspid regurgitation signal is inadequate for assessing PA pressure. The tricuspid regurgitant velocity is 2.29 m/s, and with an assumed right atrial  pressure of 8 mmHg, the estimated right ventricular systolic pressure is 01.5 mmHg. Left Atrium: Left atrial size was normal in size. Right Atrium: Right atrial size was normal in size. Pericardium: Trivial pericardial effusion is present. Mitral Valve: The mitral valve is normal in structure. Trivial mitral valve regurgitation. No evidence of mitral valve stenosis. Tricuspid Valve: The tricuspid valve is normal in structure. Tricuspid valve regurgitation is trivial. No evidence of tricuspid stenosis. Aortic Valve: The aortic valve is tricuspid. Aortic valve regurgitation is not visualized. No aortic stenosis is present. Pulmonic Valve: The pulmonic valve was normal in structure. Pulmonic valve regurgitation is not visualized.  No evidence of pulmonic stenosis. Aorta: The aortic root is normal in size and structure. Venous: The inferior vena cava is normal in size with greater than 50% respiratory variability, suggesting right atrial pressure of 3 mmHg. IAS/Shunts: The interatrial septum was not well visualized.  LEFT VENTRICLE PLAX 2D LVIDd:         5.10 cm  Diastology LVIDs:         3.20 cm  LV e' medial:    7.46 cm/s LV PW:         1.10 cm  LV E/e' medial:  15.7 LV IVS:        1.00 cm  LV e' lateral:  10.10 cm/s LVOT diam:     1.90 cm  LV E/e' lateral: 11.6 LV SV:         79 LV SV Index:   40 LVOT Area:     2.84 cm  RIGHT VENTRICLE RV S prime:     17.70 cm/s TAPSE (M-mode): 2.1 cm LEFT ATRIUM             Index       RIGHT ATRIUM           Index LA diam:        3.90 cm 2.00 cm/m  RA Area:     16.10 cm LA Vol (A2C):   49.9 ml 25.59 ml/m RA Volume:   38.20 ml  19.59 ml/m LA Vol (A4C):   54.7 ml 28.05 ml/m LA Biplane Vol: 52.7 ml 27.03 ml/m  AORTIC VALVE LVOT Vmax:   114.00 cm/s LVOT Vmean:  76.800 cm/s LVOT VTI:    0.277 m  AORTA Ao Root diam: 3.40 cm Ao Asc diam:  3.30 cm MITRAL VALVE                TRICUSPID VALVE MV Area (PHT): 2.99 cm     TR Peak grad:   21.0 mmHg MV Decel Time: 254 msec     TR Vmax:        229.00 cm/s MV E velocity: 117.00 cm/s MV A velocity: 70.40 cm/s   SHUNTS MV E/A ratio:  1.66         Systemic VTI:  0.28 m                             Systemic Diam: 1.90 cm Kirk Ruths MD Electronically signed by Kirk Ruths MD Signature Date/Time: 05/26/2021/12:58:06 PM    Final      Medications:    sodium chloride      aspirin  81 mg Oral Daily   ezetimibe  10 mg Oral Daily   And   simvastatin  40 mg Oral q1800   insulin aspart  0-9 Units Subcutaneous Q4H   insulin glargine  25 Units Subcutaneous QHS   levothyroxine  25 mcg Oral Q0600   metoprolol tartrate  25 mg Oral BID   oxybutynin  5 mg Oral Daily   sodium chloride flush  3 mL Intravenous Q12H   [START ON 05/28/2021] torsemide  20 mg Oral Daily    sodium chloride, acetaminophen **OR** acetaminophen, albuterol, HYDROcodone-acetaminophen, sodium chloride flush, traMADol  Assessment/ Plan:  Acute kidney injury.  Appears to be in the setting of increasing shortness of breath lower extremity edema and increasing dose of Lasix.  She has a solitary functioning kidney.  Renal ultrasound showed left prior nephrectomy right without hydronephrosis.  Urinalysis without proteinuria.  Continue to avoid nephrotoxins no IV contrast no ACE inhibitor's ARB's try to avoid hemodynamic instability.  Renally adjust medications.  Check daily renal panel monitor I's and O's closely.  Baseline creatinine about 1.5 mg/dL.  We will continue to follow. ANEMIA-does not appear to be an issue at this point. MBD-does not appear to be an issue at this point. HTN/VOL-transition to oral torsemide continue to monitor I's and O's.  2D echo shows diastolic dysfunction     LOS: 2 Sherril Croon _0 _1 :22 PM

## 2021-05-27 NOTE — Progress Notes (Signed)
PROGRESS NOTE    Margaret Dyer  RCO:[id number] DOB: May 09, 1935 DOA: 05/25/2021 PCP: Ann Held, DO   Brief Narrative:  HPI On 05/25/2021 by Dr. Toy Baker Margaret Dyer is a 86 y.o. female with medical history significant of  thrombocytopenia, DM2, HTN, CKD, chronic diastolic CHF , HLD, Liver Cirrhosis/NASH, pulmoanry HTN, renal cell CA sp nephrectomy Right heart failure,hypothyroidism, chronic respiratory failure on O2 2L, tobacco abuse    Presented with SOB for the past 4 days probably was going on a bit longer than that.  No chest pain associated she increase her oxygen to 2.75 L from 2.  She has not had any fever or cold symptoms. Patient contacted her primary cardiologist Dr. Debara Pickett who recommended increasing Lasix to 40 twice daily for 3 days she did take 1 extra dose but still short of breath and presented to emergency department. Per family she could barely finish a full sentence.  She has been having some swelling of her ankles as well as midsection.   Of note on June 15 patient was seen at urgent care and was treated with Bactrim for UTI.  Interim history Patient admitted with shortness of breath and CHF exacerbation.  Also found to have acute kidney injury.  Nephrology and cardiology consulted and appreciated.  Currently on IV Lasix. Assessment & Plan   Acute diastolic CHF exacerbation -Patient presenting with progressive shortness of breath and increased oxygen need -Chest x-ray independently reviewed, showed interstitial edema and vascular congestion -BNP 297.7 -Echocardiogram pending -Patient placed on IV Lasix 40 mg twice daily -Cardiology consulted and appreciated-continue IV Lasix today, planning to transition patient to torsemide on 6/25 -Monitor intake and output, daily weights  Acute kidney injury on chronic kidney disease, stage IIIb/solitary kidney with history of renal cell carcinoma -Possibly secondary to medications -Creatinin was 2.54 on  admission, down to 2.31 today (baseline 1.2-1.5) -Patient was recently on Bactrim for urinary tract infection as well as diuretics and and losartan -Nephrology consulted and appreciated given that patient has a solitary kidney -Continue to monitor BMP  Essential hypertension -BP somewhat soft -Amlodipine and hydralazine held -Continue IV lasix  Diabetes mellitus, type II -Hemoglobin A1c 6.3 -Continue insulin sliding scale and CBG monitoring  Pulmonary hypertension with chronic hypoxic respiratory failure -Patient uses 2 L of home oxygen at baseline- currently on 2.5L -Patient should follow-up with pulmonology as an outpatient  Overactive bladder -Continue Ditropan  Thrombocytopenia -Chronic and stable  Liver cirrhosis/NASH -Given worsening creatinine, spironolactone held  Obesity -BMI 36.14 -Patient wanting to follow-up with her PCP for lifestyle modification  Hypothyroidism -TSH 0.243- low- however given acute illness, will not make a medication adjustments. -Patient should follow-up with her PCP for repeat thyroid function studies as an outpatient -Continue synthroid   DVT Prophylaxis  SCDs  Code Status: Full  Family Communication: None at bedside  Disposition Plan:  Status is: Inpatient  Remains inpatient appropriate because:IV treatments appropriate due to intensity of illness or inability to take PO and Inpatient level of care appropriate due to severity of illness  Dispo: The patient is from: Home              Anticipated d/c is to:  TBD              Patient currently is not medically stable to d/c.   Difficult to place patient No   Consultants Cardiology Nephrology  Procedures  Echocardiogram  Antibiotics   Anti-infectives (From admission, onward)  None       Subjective:   Margaret Dyer seen and examined today.  Concerned that some of her home medications have not been restarted.  Feels her shortness of breath is mildly improved but not  back to baseline.  Denies current chest pain, abdominal pain, nausea or vomiting, dizziness or headache.   Objective:   Vitals:   05/26/21 1938 05/27/21 0028 05/27/21 0306 05/27/21 0747  BP: (!) 125/46 (!) 136/52 (!) 138/50 (!) 143/61  Pulse: 65 (!) 57 (!) 59 63  Resp: _0 Temp: 98.8 F (37.1 C) 98.1 F (36.7 C) 98.2 F (36.8 C) 98.2 F (36.8 C)  TempSrc: Oral Oral Oral Oral  SpO2: 98% 99% 97% 98%  Weight:   90.7 kg   Height:        Intake/Output Summary (Last 24 hours) at 05/27/2021 1019 Last data filed at 05/27/2021 0849 Gross per 24 hour  Intake 817 ml  Output 1400 ml  Net -583 ml    Filed Weights   05/25/21 1417 05/26/21 1530 05/27/21 0306  Weight: 92.5 kg 92 kg 90.7 kg    Exam General: Well developed, chronically ill-appearing, NAD HEENT: NCAT, mucous membranes moist.  Cardiovascular: S1 S2 auscultated, RRR Respiratory: Diminished breath sounds, no wheezing Abdomen: Soft, obese, nontender, nondistended, + bowel sounds Extremities: warm dry without cyanosis clubbing. Trace LE edema Neuro: AAOx3, nonfocal Psych: appropriate mood and affect   Data Reviewed: I have personally reviewed following labs and imaging studies  CBC: Recent Labs  Lab 05/25/21 1523 05/26/21 0445 05/27/21 0322  WBC 6.1 7.2 5.6  NEUTROABS 4.6 5.3  --   HGB 10.1* 10.5* 10.1*  HCT 31.5* 32.4* 30.6*  MCV 94.3 95.9 93.3  PLT 92* 102* 101*    Basic Metabolic Panel: Recent Labs  Lab 05/25/21 1523 05/26/21 0445 05/27/21 0322  NA 134* 136 136  K 5.1 4.8 4.6  CL 100 103 96*  CO2 _1 GLUCOSE 154* 148* 221*  BUN 57* 62* 69*  CREATININE 2.54* 2.35* 2.31*  CALCIUM 8.7* 9.0 9.2  MG  --  2.3  --   PHOS  --  5.0* 4.6    GFR: Estimated Creatinine Clearance: 19.6 mL/min (A) (by C-G formula based on SCr of 2.31 mg/dL (H)). Liver Function Tests: Recent Labs  Lab 05/25/21 1523 05/26/21 0445 05/27/21 0322  AST 17 18  --   ALT 17 19  --   ALKPHOS 48 50  --   BILITOT  0.8 0.7  --   PROT 6.1* 6.4*  --   ALBUMIN 3.3* 3.4* 3.2*    No results for input(s): LIPASE, AMYLASE in the last 168 hours. No results for input(s): AMMONIA in the last 168 hours. Coagulation Profile: Recent Labs  Lab 05/25/21 1927  INR 1.2    Cardiac Enzymes: No results for input(s): CKTOTAL, CKMB, CKMBINDEX, TROPONINI in the last 168 hours. BNP (last 3 results) No results for input(s): PROBNP in the last 8760 hours. HbA1C: Recent Labs    05/25/21 1927  HGBA1C 6.3*    CBG: Recent Labs  Lab 05/26/21 1539 05/26/21 2021 05/27/21 0019 05/27/21 0426 05/27/21 0812  GLUCAP 197* 234* 119* 217* 168*    Lipid Profile: No results for input(s): CHOL, HDL, LDLCALC, TRIG, CHOLHDL, LDLDIRECT in the last 72 hours. Thyroid Function Tests: Recent Labs    05/26/21 0445  TSH 0.243*    Anemia Panel: No results for input(s): VITAMINB12, FOLATE, FERRITIN, TIBC, IRON, RETICCTPCT in  the last 72 hours. Urine analysis:    Component Value Date/Time   COLORURINE YELLOW 05/26/2021 1205   APPEARANCEUR CLEAR 05/26/2021 1205   LABSPEC 1.011 05/26/2021 1205   PHURINE 5.0 05/26/2021 1205   GLUCOSEU 50 (A) 05/26/2021 1205   GLUCOSEU NEGATIVE 07/21/2019 1614   HGBUR NEGATIVE 05/26/2021 1205   HGBUR large 02/17/2011 1432   BILIRUBINUR NEGATIVE 05/26/2021 1205   BILIRUBINUR negative 05/18/2021 1935   BILIRUBINUR neg 01/31/2019 1624   KETONESUR NEGATIVE 05/26/2021 1205   PROTEINUR NEGATIVE 05/26/2021 1205   UROBILINOGEN 0.2 05/18/2021 1935   UROBILINOGEN 0.2 07/21/2019 1614   NITRITE NEGATIVE 05/26/2021 1205   [place] 05/26/2021 1205   Sepsis Labs: _0 (procalcitonin:4,lacticidven:4)  ) Recent Results (from the past 240 hour(s))  Urine Culture     Status: Abnormal   Collection Time: 05/18/21  7:43 PM   Specimen: Urine, Random  Result Value Ref Range Status   Specimen Description URINE, RANDOM  Final   Special Requests   Final    NONE Performed at [facility], [place] [address], [place], [place] [address]    Culture >=100,000 COLONIES/mL KLEBSIELLA PNEUMONIAE (A)  Final   Report Status 05/22/2021 FINAL  Final   Organism ID, Bacteria KLEBSIELLA PNEUMONIAE (A)  Final      Susceptibility   Klebsiella pneumoniae - MIC*    AMPICILLIN RESISTANT Resistant     CEFAZOLIN <=4 SENSITIVE Sensitive     CEFEPIME <=0.12 SENSITIVE Sensitive     CEFTRIAXONE <=0.25 SENSITIVE Sensitive     CIPROFLOXACIN <=0.25 SENSITIVE Sensitive     GENTAMICIN <=1 SENSITIVE Sensitive     IMIPENEM <=0.25 SENSITIVE Sensitive     NITROFURANTOIN 32 SENSITIVE Sensitive     TRIMETH/SULFA <=20 SENSITIVE Sensitive     AMPICILLIN/SULBACTAM <=2 SENSITIVE Sensitive     PIP/TAZO <=4 SENSITIVE Sensitive     * >=100,000 COLONIES/mL KLEBSIELLA PNEUMONIAE  SARS CORONAVIRUS 2 (TAT 6-24 HRS) Nasopharyngeal Nasopharyngeal Swab     Status: None   Collection Time: 05/25/21  6:18 PM   Specimen: Nasopharyngeal Swab  Result Value Ref Range Status   SARS Coronavirus 2 NEGATIVE NEGATIVE Final    Comment: (NOTE) SARS-CoV-2 target nucleic acids are NOT DETECTED.  The SARS-CoV-2 RNA is generally detectable in upper and lower respiratory specimens during the acute phase of infection. Negative results do not preclude SARS-CoV-2 infection, do not rule out co-infections with other pathogens, and should not be used as the sole basis for treatment or other patient management decisions. Negative results must be combined with clinical observations, patient history, and epidemiological information. The expected result is Negative.  Fact Sheet for Patients: SugarRoll.be  Fact Sheet for Healthcare Providers: https://www.woods-mathews.com/  This test is not yet approved or cleared by the [place] FDA and  has been authorized for detection and/or diagnosis of SARS-CoV-2 by FDA under an Emergency Use Authorization (EUA). This EUA will remain  in  effect (meaning this test can be used) for the duration of the COVID-19 declaration under Se ction 564(b)(1) of the Act, 21 U.S.C. section 360bbb-3(b)(1), unless the authorization is terminated or revoked sooner.  Performed at [facility], [place] [address], [place], [place] [address]       Radiology Studies: US RENAL  Result Date: 05/26/2021 CLINICAL DATA:  Acute renal insufficiency.  Prior left nephrectomy. EXAM: RENAL / URINARY TRACT ULTRASOUND COMPLETE COMPARISON:  CT 07/02/2019. FINDINGS: Right Kidney: Renal measurements: 14.6 x 5.5 x 5.4 cm = volume: 227.8 mL. Cortical  thinning. Echogenicity within normal limits. No mass or hydronephrosis visualized. Left Kidney: Renal measurements: Prior nephrectomy. Bladder: Appears normal for degree of bladder distention. Right ureteral jet not identified. Other: None. IMPRESSION: 1. Right renal cortical thinning. No hydronephrosis or bladder distention. Right ureteral jet not identified. 2.  Prior left nephrectomy. Electronically Signed   By: Marcello Moores  Register   On: 05/26/2021 13:38   DG Chest Port 1 View  Result Date: 05/25/2021 CLINICAL DATA:  Shortness of breath EXAM: PORTABLE CHEST 1 VIEW COMPARISON:  07/10/2019, CT 12/01/2017 FINDINGS: Cardiomegaly with vascular congestion and mild interstitial pulmonary edema. No sizable effusion. No focal consolidation. Atelectasis or scarring in the left mid lung. Aortic atherosclerosis. No pneumothorax. IMPRESSION: Cardiomegaly with vascular congestion and mild interstitial edema Electronically Signed   By: Donavan Foil M.D.   On: 05/25/2021 15:41   ECHOCARDIOGRAM COMPLETE  Result Date: 05/26/2021    ECHOCARDIOGRAM REPORT   Patient Name:   Margaret Dyer Date of Exam: 05/26/2021 Medical Rec #:  [id number]     Height:       63.0 in Accession #:    [id number]    Weight:       204.0 lb Date of Birth:  05-06-1935     BSA:          1.950 m Patient Age:    36 years      BP:           139/59 mmHg Patient Gender: F              HR:           65 bpm. Exam Location:  Inpatient Procedure: 2D Echo, Color Doppler and Cardiac Doppler Indications:    CHF-Acute Diastolic Y70.62  History:        Patient has prior history of Echocardiogram examinations, most                 recent 05/13/2017. CHF, Pulmonary HTN; Risk Factors:Diabetes,                 Dyslipidemia and Hypertension.  Sonographer:    Bernadene Person RDCS Referring Phys: [place]  1. Left ventricular ejection fraction, by estimation, is 60 to 65%. The left ventricle has normal function. The left ventricle has no regional wall motion abnormalities. Left ventricular diastolic parameters are consistent with Grade II diastolic dysfunction (pseudonormalization).  2. Right ventricular systolic function is normal. The right ventricular size is normal. Tricuspid regurgitation signal is inadequate for assessing PA pressure.  3. The mitral valve is normal in structure. Trivial mitral valve regurgitation. No evidence of mitral stenosis.  4. The aortic valve is tricuspid. Aortic valve regurgitation is not visualized. No aortic stenosis is present.  5. The inferior vena cava is normal in size with greater than 50% respiratory variability, suggesting right atrial pressure of 3 mmHg. FINDINGS  Left Ventricle: Left ventricular ejection fraction, by estimation, is 60 to 65%. The left ventricle has normal function. The left ventricle has no regional wall motion abnormalities. The left ventricular internal cavity size was normal in size. There is  no left ventricular hypertrophy. Left ventricular diastolic parameters are consistent with Grade II diastolic dysfunction (pseudonormalization). Right Ventricle: The right ventricular size is normal. Right ventricular systolic function is normal. Tricuspid regurgitation signal is inadequate for assessing PA pressure. The tricuspid regurgitant velocity is 2.29 m/s, and with an assumed right atrial  pressure of 8 mmHg, the  estimated right ventricular systolic pressure is  29.0 mmHg. Left Atrium: Left atrial size was normal in size. Right Atrium: Right atrial size was normal in size. Pericardium: Trivial pericardial effusion is present. Mitral Valve: The mitral valve is normal in structure. Trivial mitral valve regurgitation. No evidence of mitral valve stenosis. Tricuspid Valve: The tricuspid valve is normal in structure. Tricuspid valve regurgitation is trivial. No evidence of tricuspid stenosis. Aortic Valve: The aortic valve is tricuspid. Aortic valve regurgitation is not visualized. No aortic stenosis is present. Pulmonic Valve: The pulmonic valve was normal in structure. Pulmonic valve regurgitation is not visualized. No evidence of pulmonic stenosis. Aorta: The aortic root is normal in size and structure. Venous: The inferior vena cava is normal in size with greater than 50% respiratory variability, suggesting right atrial pressure of 3 mmHg. IAS/Shunts: The interatrial septum was not well visualized.  LEFT VENTRICLE PLAX 2D LVIDd:         5.10 cm  Diastology LVIDs:         3.20 cm  LV e' medial:    7.46 cm/s LV PW:         1.10 cm  LV E/e' medial:  15.7 LV IVS:        1.00 cm  LV e' lateral:   10.10 cm/s LVOT diam:     1.90 cm  LV E/e' lateral: 11.6 LV SV:         79 LV SV Index:   40 LVOT Area:     2.84 cm  RIGHT VENTRICLE RV S prime:     17.70 cm/s TAPSE (M-mode): 2.1 cm LEFT ATRIUM             Index       RIGHT ATRIUM           Index LA diam:        3.90 cm 2.00 cm/m  RA Area:     16.10 cm LA Vol (A2C):   49.9 ml 25.59 ml/m RA Volume:   38.20 ml  19.59 ml/m LA Vol (A4C):   54.7 ml 28.05 ml/m LA Biplane Vol: 52.7 ml 27.03 ml/m  AORTIC VALVE LVOT Vmax:   114.00 cm/s LVOT Vmean:  76.800 cm/s LVOT VTI:    0.277 m  AORTA Ao Root diam: 3.40 cm Ao Asc diam:  3.30 cm MITRAL VALVE                TRICUSPID VALVE MV Area (PHT): 2.99 cm     TR Peak grad:   21.0 mmHg MV Decel Time: 254 msec     TR Vmax:        229.00 cm/s MV E  velocity: 117.00 cm/s MV A velocity: 70.40 cm/s   SHUNTS MV E/A ratio:  1.66         Systemic VTI:  0.28 m                             Systemic Diam: 1.90 cm Kirk Ruths MD Electronically signed by Kirk Ruths MD Signature Date/Time: 05/26/2021/12:58:06 PM    Final      Scheduled Meds:  aspirin  81 mg Oral Daily   ezetimibe  10 mg Oral Daily   And   simvastatin  40 mg Oral q1800   insulin aspart  0-9 Units Subcutaneous Q4H   insulin glargine  25 Units Subcutaneous QHS   levothyroxine  25 mcg Oral Q0600   metoprolol tartrate  25 mg Oral BID  oxybutynin  5 mg Oral Daily   sodium chloride flush  3 mL Intravenous Q12H   [START ON 05/28/2021] torsemide  20 mg Oral Daily   Continuous Infusions:  sodium chloride       LOS: 2 days   Time Spent in minutes   45 minutes  Koben Daman D.O. on 05/27/2021 at 10:19 AM  Between 7am to 7pm - Please see pager noted on [email or web address]  After 7pm go to [email or web address]  And look for the night coverage person covering for me after hours  [facility] Office  [phone]

## 2021-05-27 NOTE — Telephone Encounter (Signed)
Do we need to place this referral?

## 2021-05-27 NOTE — Progress Notes (Signed)
Progress Note  Patient Name: Margaret Dyer Date of Encounter: 05/27/2021  [facility] Cardiologist: Pixie Casino, MD   Subjective   Patient states she is feeling better today, less SOB, legs are less swollen. She states she has not been out of bed much, wants to take a shower. She is tolerating PO intake. She denied any chest pain.   Inpatient Medications    Scheduled Meds:  aspirin  81 mg Oral Daily   ezetimibe  10 mg Oral Daily   And   simvastatin  40 mg Oral q1800   insulin aspart  0-9 Units Subcutaneous Q4H   insulin glargine  25 Units Subcutaneous QHS   levothyroxine  25 mcg Oral Q0600   metoprolol tartrate  25 mg Oral BID   oxybutynin  5 mg Oral Daily   sodium chloride flush  3 mL Intravenous Q12H   [START ON 05/28/2021] torsemide  20 mg Oral Daily   Continuous Infusions:  sodium chloride     PRN Meds: sodium chloride, acetaminophen **OR** acetaminophen, albuterol, HYDROcodone-acetaminophen, sodium chloride flush, traMADol   Vital Signs    Vitals:   05/26/21 1938 05/27/21 0028 05/27/21 0306 05/27/21 0747  BP: (!) 125/46 (!) 136/52 (!) 138/50 (!) 143/61  Pulse: 65 (!) 57 (!) 59 63  Resp: _0 Temp: 98.8 F (37.1 C) 98.1 F (36.7 C) 98.2 F (36.8 C) 98.2 F (36.8 C)  TempSrc: Oral Oral Oral Oral  SpO2: 98% 99% 97% 98%  Weight:   90.7 kg   Height:        Intake/Output Summary (Last 24 hours) at 05/27/2021 0931 Last data filed at 05/27/2021 0849 Gross per 24 hour  Intake 817 ml  Output 1400 ml  Net -583 ml   Last 3 Weights 05/27/2021 05/26/2021 05/25/2021  Weight (lbs) 200 lb 202 lb 14.4 oz 204 lb  Weight (kg) 90.719 kg 92.035 kg 92.534 kg      Telemetry    Sinus rhythm/bradycardia with ventricular rate of 55-70s, artifacts  - Personally Reviewed  ECG    No new tracing today  - Personally Reviewed  Physical Exam   GEN: No acute distress.   Neck: JVD 7cm  Cardiac: RRR, no murmurs, rubs, or gallops.  Respiratory: Clear to  auscultation bilaterally. On 2LNC. Mild DOE.  GI: Soft, nontender, non-distended  MS: 1- BLE edema; No deformity. Neuro:  Nonfocal  Psych: Normal affect   Labs    High Sensitivity Troponin:   Recent Labs  Lab 05/25/21 1840  TROPONINIHS 8      Chemistry Recent Labs  Lab 05/25/21 1523 05/26/21 0445 05/27/21 0322  NA 134* 136 136  K 5.1 4.8 4.6  CL 100 103 96*  CO2 _1 GLUCOSE 154* 148* 221*  BUN 57* 62* 69*  CREATININE 2.54* 2.35* 2.31*  CALCIUM 8.7* 9.0 9.2  PROT 6.1* 6.4*  --   ALBUMIN 3.3* 3.4* 3.2*  AST 17 18  --   ALT 17 19  --   ALKPHOS 48 50  --   BILITOT 0.8 0.7  --   GFRNONAA 18* 20* 20*  ANIONGAP _2 Hematology Recent Labs  Lab 05/25/21 1523 05/26/21 0445 05/27/21 0322  WBC 6.1 7.2 5.6  RBC 3.34* 3.38* 3.28*  HGB 10.1* 10.5* 10.1*  HCT 31.5* 32.4* 30.6*  MCV 94.3 95.9 93.3  MCH 30.2 31.1 30.8  MCHC 32.1 32.4 33.0  RDW 13.3 13.2 12.9  PLT 92* 102* 101*    BNP Recent Labs  Lab 05/25/21 1523  BNP 297.7*     DDimer No results for input(s): DDIMER in the last 168 hours.   Radiology    US RENAL  Result Date: 05/26/2021 CLINICAL DATA:  Acute renal insufficiency.  Prior left nephrectomy. EXAM: RENAL / URINARY TRACT ULTRASOUND COMPLETE COMPARISON:  CT 07/02/2019. FINDINGS: Right Kidney: Renal measurements: 14.6 x 5.5 x 5.4 cm = volume: 227.8 mL. Cortical thinning. Echogenicity within normal limits. No mass or hydronephrosis visualized. Left Kidney: Renal measurements: Prior nephrectomy. Bladder: Appears normal for degree of bladder distention. Right ureteral jet not identified. Other: None. IMPRESSION: 1. Right renal cortical thinning. No hydronephrosis or bladder distention. Right ureteral jet not identified. 2.  Prior left nephrectomy. Electronically Signed   By: Marcello Moores  Register   On: 05/26/2021 13:38   DG Chest Port 1 View  Result Date: 05/25/2021 CLINICAL DATA:  Shortness of breath EXAM: PORTABLE CHEST 1 VIEW COMPARISON:   07/10/2019, CT 12/01/2017 FINDINGS: Cardiomegaly with vascular congestion and mild interstitial pulmonary edema. No sizable effusion. No focal consolidation. Atelectasis or scarring in the left mid lung. Aortic atherosclerosis. No pneumothorax. IMPRESSION: Cardiomegaly with vascular congestion and mild interstitial edema Electronically Signed   By: Donavan Foil M.D.   On: 05/25/2021 15:41   ECHOCARDIOGRAM COMPLETE  Result Date: 05/26/2021    ECHOCARDIOGRAM REPORT   Patient Name:   Margaret Dyer Date of Exam: 05/26/2021 Medical Rec #:  [id number]     Height:       63.0 in Accession #:    [id number]    Weight:       204.0 lb Date of Birth:  1935/11/23     BSA:          1.950 m Patient Age:    86 years      BP:           139/59 mmHg Patient Gender: F             HR:           65 bpm. Exam Location:  Inpatient Procedure: 2D Echo, Color Doppler and Cardiac Doppler Indications:    CHF-Acute Diastolic Z66.06  History:        Patient has prior history of Echocardiogram examinations, most                 recent 05/13/2017. CHF, Pulmonary HTN; Risk Factors:Diabetes,                 Dyslipidemia and Hypertension.  Sonographer:    Bernadene Person RDCS Referring Phys: Aubrey  1. Left ventricular ejection fraction, by estimation, is 60 to 65%. The left ventricle has normal function. The left ventricle has no regional wall motion abnormalities. Left ventricular diastolic parameters are consistent with Grade II diastolic dysfunction (pseudonormalization).  2. Right ventricular systolic function is normal. The right ventricular size is normal. Tricuspid regurgitation signal is inadequate for assessing PA pressure.  3. The mitral valve is normal in structure. Trivial mitral valve regurgitation. No evidence of mitral stenosis.  4. The aortic valve is tricuspid. Aortic valve regurgitation is not visualized. No aortic stenosis is present.  5. The inferior vena cava is normal in size with greater than 50%  respiratory variability, suggesting right atrial pressure of 3 mmHg. FINDINGS  Left Ventricle: Left ventricular ejection fraction, by estimation, is 60 to 65%. The left ventricle has normal function. The left ventricle has no  regional wall motion abnormalities. The left ventricular internal cavity size was normal in size. There is  no left ventricular hypertrophy. Left ventricular diastolic parameters are consistent with Grade II diastolic dysfunction (pseudonormalization). Right Ventricle: The right ventricular size is normal. Right ventricular systolic function is normal. Tricuspid regurgitation signal is inadequate for assessing PA pressure. The tricuspid regurgitant velocity is 2.29 m/s, and with an assumed right atrial  pressure of 8 mmHg, the estimated right ventricular systolic pressure is 67.8 mmHg. Left Atrium: Left atrial size was normal in size. Right Atrium: Right atrial size was normal in size. Pericardium: Trivial pericardial effusion is present. Mitral Valve: The mitral valve is normal in structure. Trivial mitral valve regurgitation. No evidence of mitral valve stenosis. Tricuspid Valve: The tricuspid valve is normal in structure. Tricuspid valve regurgitation is trivial. No evidence of tricuspid stenosis. Aortic Valve: The aortic valve is tricuspid. Aortic valve regurgitation is not visualized. No aortic stenosis is present. Pulmonic Valve: The pulmonic valve was normal in structure. Pulmonic valve regurgitation is not visualized. No evidence of pulmonic stenosis. Aorta: The aortic root is normal in size and structure. Venous: The inferior vena cava is normal in size with greater than 50% respiratory variability, suggesting right atrial pressure of 3 mmHg. IAS/Shunts: The interatrial septum was not well visualized.  LEFT VENTRICLE PLAX 2D LVIDd:         5.10 cm  Diastology LVIDs:         3.20 cm  LV e' medial:    7.46 cm/s LV PW:         1.10 cm  LV E/e' medial:  15.7 LV IVS:        1.00 cm  LV e'  lateral:   10.10 cm/s LVOT diam:     1.90 cm  LV E/e' lateral: 11.6 LV SV:         79 LV SV Index:   40 LVOT Area:     2.84 cm  RIGHT VENTRICLE RV S prime:     17.70 cm/s TAPSE (M-mode): 2.1 cm LEFT ATRIUM             Index       RIGHT ATRIUM           Index LA diam:        3.90 cm 2.00 cm/m  RA Area:     16.10 cm LA Vol (A2C):   49.9 ml 25.59 ml/m RA Volume:   38.20 ml  19.59 ml/m LA Vol (A4C):   54.7 ml 28.05 ml/m LA Biplane Vol: 52.7 ml 27.03 ml/m  AORTIC VALVE LVOT Vmax:   114.00 cm/s LVOT Vmean:  76.800 cm/s LVOT VTI:    0.277 m  AORTA Ao Root diam: 3.40 cm Ao Asc diam:  3.30 cm MITRAL VALVE                TRICUSPID VALVE MV Area (PHT): 2.99 cm     TR Peak grad:   21.0 mmHg MV Decel Time: 254 msec     TR Vmax:        229.00 cm/s MV E velocity: 117.00 cm/s MV A velocity: 70.40 cm/s   SHUNTS MV E/A ratio:  1.66         Systemic VTI:  0.28 m                             Systemic Diam: 1.90 cm Kirk Ruths MD Electronically signed  by Kirk Ruths MD Signature Date/Time: 05/26/2021/12:58:06 PM    Final     Cardiac Studies   Myoview on 06/21/2017:   The left ventricular ejection fraction is hyperdynamic (>65%). Nuclear stress EF: 71%. There was no ST segment deviation noted during stress. No T wave inversion was noted during stress. The study is normal. This is a low risk study.     Right heart cath on 05/18/2017:   Hemodynamic findings consistent with moderate-severe pulmonary hypertension.   Severe diastolic heart failure with PCWP of 32 mmHg. Noted large V wave on wedge waveform. Appears to be mostly pulmonary venous congestion, however there is a transpulmonary gradient of 12 mmHg suggesting there is a component of pulmonary disease as well. The presence of a very large aching V wave in PCWP waveforms would suggest mitral regurgitation.   The patient will return to her  nursing floor for continued management with IV diuresis.   I will increase the Lasix dose to 80 mg twice a day.    Echo from 05/13/2017:   Left ventricle: The cavity size was normal. Wall thickness was    normal. Systolic function was normal. The estimated ejection    fraction was in the range of 55% to 60%. Features are consistent    with a pseudonormal left ventricular filling pattern, with    concomitant abnormal relaxation and increased filling pressure    (grade 2 diastolic dysfunction).  - Left atrium: The atrium was mildly dilated.   Patient Profile       FAIRY ASHLOCK is a 86 y.o. female with a hx of childhood asthma,  chronic diastolic heart failure, pulmonary hypertension, chronic hypoxic respiratory failure on 2LNC,  HTN, HLD, cirrhosis due to NASH, type 2 DM, CKD III, renal cell carcinoma s/p right nephrectomy, chronic thrombocytopenia under survillence, hypothyroidism, GERD,   who is being seen 05/26/2021 for the evaluation of CHF at the request of Dr. Roel Cluck.   Assessment & Plan    Acute on chronic diastolic heart failure - presented with worsening SOB on exertion for 4 days - BNP 297 ( was 219 in 2018) - CXR showed cardiomegaly with vascular congestion and mild interstitial edema - hypervolemia is improving clinically today  - currently on IV Lasix 62m BID, was on lasix 457mdaily at home but increased to BID x3 days by the office on 05/24/21 - UOP 140044mver the past 24 hours, weight 204>200 since admission - monitor intake and output, daily weight, low Na diet, fluid restriction <1.5L daily  - Echo 05/26/21 with EF 60-65%, no RWMA, grade II DD, RV normal. Trivial MR, RVSP 29 mmHg (right failure improved comparing to 2018) - suspect AKI induced volume overload  - will transition to torsemide 51m58mily tomorrow, continue Metoprolol 25mg10m, OK to hold losartan and spironolactone until AKI resolves    Pulmonary hypertension  - RHC from 2018 showed moderate to severe pulmonary HTN, etiology likely left sided heart failure - Echo 05/26/21 with EF 60-65%, no RWMA, grade II DD, RV  normal. Trivial MR, RVSP 29 mmHg (right failure improved comparing to 2018) - needs outpatient PFT to evaluate underlying asthma    AKI on CKD III - Cr 2.54 POA, baseline appears 1.2-1.4, downtrend to 2.31 today - FeNa suggest intrinsic etiology, although he is on diuretic prior - clinically hypervolemic  - possible drug induced AIN and subsequent volume overload - OK to hold losartan and spironolactone , off Bactrim now    Chronic hypoxic  respiratory failure Asthma  - on baseline [place] oxygen support - no wheezing on exam - outpatient PFTs to be done    Recent Klebsiella UTI - s/ 7 days Bactrim since 05/18/21   HTN - BP fairly controlled, continued on metoprolol 71m BID, amlodipine 568mBID, hydralazine 5034mID, losartan 69m16mily, and spironolactone 25mg35md currently, transition to torsemide 20mg 46my    HLD - no recent lipid panel, lipid panel pending today  - continue zetia and simvastatin   Type 2 DM - A1C 6.3%, fair control - defer management per IM    Hypothyroidism - TSH suppressed 0.243, consider FT4 and medication adjustment if indicated   Cirrhosis due to NASH - INR 1.2, PLT 102k (near baseline), LFTs and bilirubin WNL - clinically compensated        For questions or updates, please contact [facility] Please consult [email or web address] for contact info under        Signed, Gloris Shiroma ZMargie Billet6/24/2022, 9:31 AM

## 2021-05-27 NOTE — Evaluation (Signed)
Occupational Therapy Evaluation Patient Details Name: Margaret Dyer MRN: [id number] DOB: [birth year] Today's Date: 05/27/2021    History of Present Illness Pt is an 86 y/o female admitted 6/22 secondary to increased SOB and LE swelling. Thought to be secondary to CHF exacerbation. Pt also found to have AKI. PMH includes HTN, DM, CKD, CHF, pulmonary HTN, renal cell carcinoma s/p nephrectomy.   Clinical Impression   PTA, pt alternates staying at each daughter's home. Pt reports typically Modified Independent with ADLs and mobility using Rollator. Pt presents now with minor deficits in cardiopulmonary tolerance, dynamic standing balance and ability to reach L foot for LB ADLs. Pt exiting bathroom on OT entry without assist, Supervision for continued mobility using RW. Pt able to demo UB ADLs at Setup and Min A for LB ADLs seated/standing at sink. Educated on trial of AE for LB dressing to maximize independence with ADLs. Educated on energy conservation strategies with pt denying SOB on RA though noted with desats to 84%. SpO2 WFL On 2 L O2. Anticipate no OT needed at DC but will continue to follow acutely for LB ADLs and tub transfer assessment.     Follow Up Recommendations  No OT follow up;Supervision - Intermittent    Equipment Recommendations  3 in 1 bedside commode    Recommendations for Other Services       Precautions / Restrictions Precautions Precautions: Fall Restrictions Weight Bearing Restrictions: No      Mobility Bed Mobility Overal bed mobility: Modified Independent Bed Mobility: Sit to Supine       Sit to supine: Modified independent (Device/Increase time)        Transfers Overall transfer level: Needs assistance   Transfers: Sit to/from Stand Sit to Stand: Supervision         General transfer comment: Supervision for safety but no issues noted    Balance Overall balance assessment: Needs assistance Sitting-balance support: No upper extremity  supported;Feet supported Sitting balance-Leahy Scale: Good     Standing balance support: Bilateral upper extremity supported;During functional activity Standing balance-Leahy Scale: Fair Standing balance comment: fair static standing at sink, use of BUE support for mobility                           ADL either performed or assessed with clinical judgement   ADL Overall ADL's : Needs assistance/impaired Eating/Feeding: Independent;Sitting   Grooming: Independent;Sitting;Standing;Wash/dry face;Oral care;Brushing hair Grooming Details (indicate cue type and reason): No assist needed to complete tasks seated vs standing at sink Upper Body Bathing: Set up;Sitting   Lower Body Bathing: Set up;Sit to/from stand Lower Body Bathing Details (indicate cue type and reason): no LOB or safety concerns, able to bathe peri region standing at sink Upper Body Dressing : Set up;Sitting;Standing   Lower Body Dressing: Minimal assistance;Sit to/from stand Lower Body Dressing Details (indicate cue type and reason): Difficulty donning underwear around L foot (hx of hip fx). reports she uses a lower chair at home for this task. discussed AE use for task with pt familiar on reacher use for LB dressing Toilet Transfer: Supervision/safety;Ambulation;RW Toilet Transfer Details (indicate cue type and reason): exiting bathroom on entry Toileting- Clothing Manipulation and Hygiene: Modified independent;Sit to/from stand Toileting - Clothing Manipulation Details (indicate cue type and reason): had completed toileting task without assist     Functional mobility during ADLs: Supervision/safety;Rolling walker General ADL Comments: minor deficits in cardiopulmonary tolerance (noted desats though pt denies SOB with tasks). Encouraged  energy conservation strategies     Vision Patient Visual Report: No change from baseline Vision Assessment?: No apparent visual deficits     Perception     Praxis       Pertinent Vitals/Pain Pain Assessment: No/denies pain     Hand Dominance Right   Extremity/Trunk Assessment Upper Extremity Assessment Upper Extremity Assessment: Overall WFL for tasks assessed   Lower Extremity Assessment Lower Extremity Assessment: Defer to PT evaluation   Cervical / Trunk Assessment Cervical / Trunk Assessment: Kyphotic   Communication Communication Communication: No difficulties   Cognition Arousal/Alertness: Awake/alert Behavior During Therapy: WFL for tasks assessed/performed Overall Cognitive Status: No family/caregiver present to determine baseline cognitive functioning Area of Impairment: Attention;Memory                   Current Attention Level: Sustained Memory: Decreased short-term memory         General Comments: Perseverating on asking about replacement of purewick, had forgotten where ADL items were placed, what had been discussed with purewick,etc   General Comments  Noted at 84% on RA during tasks though pt denies SOB. With replacement of 2 L O2, 90%. pt reports using pulse ox at home    Exercises     Shoulder Instructions      Home Living Family/patient expects to be discharged to:: Private residence Living Arrangements: Children Available Help at Discharge: Family;Available 24 hours/day Type of Home: House Home Access: Stairs to enter [employer] of Steps: 4 at older daughters home and 2 at younger daughters home Entrance Stairs-Rails: Right;Left Home Layout: One level     Bathroom Shower/Tub: Teacher, early years/pre: Standard Bathroom Accessibility: Yes   Home Equipment: Grab bars - tub/shower;Walker - 4 wheels;Walker - 2 wheels;Tub bench   Additional Comments: Alternates Between daughters houses; reports she has a shower chair but does not really use anymore due to having to take chair out/put back in shower      Prior Functioning/Environment Level of Independence: Needs assistance   Gait / Transfers Assistance Needed: Uses rollator for ambulation; needs assist with steps; denies any falls since hip fx 2 years ago ADL's / Homemaking Assistance Needed: Reports able to complete ADLs, uses lower chair for LB dressing tasks. Family assist with IADLs   Comments: Reports she wears 2 L O2 at baseline        OT Problem List: Decreased activity tolerance;Impaired balance (sitting and/or standing);Decreased knowledge of use of DME or AE;Cardiopulmonary status limiting activity      OT Treatment/Interventions: Self-care/ADL training;Therapeutic exercise;Energy conservation;DME and/or AE instruction;Therapeutic activities;Patient/family education;Balance training    OT Goals(Current goals can be found in the care plan section) Acute Rehab OT Goals Patient Stated Goal: to go home OT Goal Formulation: With patient Time For Goal Achievement: 06/10/21 Potential to Achieve Goals: Good ADL Goals Pt Will Perform Lower Body Dressing: with modified independence;sitting/lateral leans;sit to/from stand;with adaptive equipment Pt Will Perform Tub/Shower Transfer: with set-up;Tub transfer;ambulating;rolling walker Additional ADL Goal #1: Pt to verbalize at least 3 energy conservation strategies to implement during ADLs/mobility  OT Frequency: Min 2X/week   Barriers to D/C:            Co-evaluation              AM-PAC OT "6 Clicks" Daily Activity     Outcome Measure Help from another person eating meals?: None Help from another person taking care of personal grooming?: None Help from another person toileting, which includes using  toliet, bedpan, or urinal?: A Little Help from another person bathing (including washing, rinsing, drying)?: A Little Help from another person to put on and taking off regular upper body clothing?: A Little Help from another person to put on and taking off regular lower body clothing?: A Little 6 Click Score: 20   End of Session Equipment Utilized  During Treatment: Rolling walker;Oxygen Nurse Communication: Other (comment) (NT - request for purewick)  Activity Tolerance: Patient tolerated treatment well Patient left: in bed;with call bell/phone within reach;with bed alarm set  OT Visit Diagnosis: Other abnormalities of gait and mobility (R26.89);Muscle weakness (generalized) (M62.81)                Time: 7356-7014 OT Time Calculation (min): 22 min Charges:  OT General Charges $OT Visit: 1 Visit OT Evaluation $OT Eval Moderate Complexity: 1 Mod  Malachy Chamber, OTR/L Acute Rehab Services Office: [phone]   Layla Maw 05/27/2021, 10:12 AM

## 2021-05-28 DIAGNOSIS — N179 Acute kidney failure, unspecified: Secondary | ICD-10-CM

## 2021-05-28 LAB — RENAL FUNCTION PANEL
Albumin: 3.3 g/dL — ABNORMAL LOW (ref 3.5–5.0)
Anion gap: 6 (ref 5–15)
BUN: 64 mg/dL — ABNORMAL HIGH (ref 8–23)
CO2: 31 mmol/L (ref 22–32)
Calcium: 9.1 mg/dL (ref 8.9–10.3)
Chloride: 102 mmol/L (ref 98–111)
Creatinine, Ser: 1.78 mg/dL — ABNORMAL HIGH (ref 0.44–1.00)
GFR, Estimated: 28 mL/min — ABNORMAL LOW (ref >60)
Glucose, Bld: 139 mg/dL — ABNORMAL HIGH (ref 70–99)
Phosphorus: 3.6 mg/dL (ref 2.5–4.6)
Potassium: 4.7 mmol/L (ref 3.5–5.1)
Sodium: 139 mmol/L (ref 135–145)

## 2021-05-28 LAB — URINALYSIS, ROUTINE W REFLEX MICROSCOPIC
Bilirubin Urine: NEGATIVE
Glucose, UA: 50 mg/dL — AB
Hgb urine dipstick: NEGATIVE
Ketones, ur: NEGATIVE mg/dL
Leukocytes,Ua: NEGATIVE
Nitrite: NEGATIVE
Protein, ur: NEGATIVE mg/dL
Specific Gravity, Urine: 1.013 (ref 1.005–1.030)
pH: 5 (ref 5.0–8.0)

## 2021-05-28 LAB — GLUCOSE, CAPILLARY
Glucose-Capillary: 126 mg/dL — ABNORMAL HIGH (ref 70–99)
Glucose-Capillary: 168 mg/dL — ABNORMAL HIGH (ref 70–99)
Glucose-Capillary: 214 mg/dL — ABNORMAL HIGH (ref 70–99)
Glucose-Capillary: 248 mg/dL — ABNORMAL HIGH (ref 70–99)
Glucose-Capillary: 305 mg/dL — ABNORMAL HIGH (ref 70–99)

## 2021-05-28 NOTE — TOC Initial Note (Addendum)
Transition of Care [facility]) - Initial/Assessment Note    Patient Details  Name: Margaret Dyer MRN: [id number] Date of Birth: 05-30-1935  Transition of Care [facility]) CM/SW Contact:    Bartholomew Crews, RN Phone Number: [phone] 05/28/2021, 1:34 PM  Clinical Narrative:                  Spoke with patient at the bedside. PTA home with daughter who is disabled. Patient typically can can do her own ADLs. She ambulates in the house short distances with her rollator. She has home oxygen at 2L through [facility] Patient in [place]. Patient stated that she also has a 3N1.   Discussed recommendations for [facility] PT. Offered choice of home health agency. [name] accepted referral for PT, RN for disease management/observation/assessment for HF,  and SW.   Patient asked about going to [place] stating that she had been there before in 2020. Discussed current recommendations and Medicare guidelines.   Patient stated that she gets medications from [facility] which is open M-F. Patient also stated that she does not have prescription coverage d/t could not afford to add-on to her supplement. She stated that she pays out of pocket for her medications.   [place]: Spoke with patient at bedside to discuss steps at house. She stated that the house is one level but there are 4 steps to climb and she is not sure that she can do this at this time.   [place] with patient's daughter, Margaret Dyer. Discussed transition home. Margaret Dyer wanting for patient to come home with [facility] services. Margaret Dyer stated that patient has declined over the last several months to only walking a few steps 3-4 times a day. Margaret Dyer stated that patient has also started to shuffle her feet. Advised that [name] was arranged.   She stated that patient has a hospital bed and a shower stool. She asked about a different set up for oxygen - advised to follow up with [facility] Patient on Monday.   Discussed finding alternate  PCP who might do home visits, but Margaret Dyer stated that patient would not want to change her PCP.   Patient will need ambulance transport home.   UPDATE 1700: Spoke with patient and her daughter, Margaret Dyer, at bedside to discuss plan for discharge once medically stable. Agreeable to home health with [name] and [facility] for transport home.  Margaret Dyer asked about learning how to manage portable oxygen stating that patient cannot remember teaching that was done - advised to follow up with [facility] Patient.   TOC following for transition needs.   Expected Discharge Plan: [place] Barriers to Discharge: Continued Medical Work up   Patient Goals and CMS Choice Patient states their goals for this hospitalization and ongoing recovery are:: home with daughter CMS Medicare.gov Compare Post Acute Care list provided to:: Patient Choice offered to / list presented to : Patient  Expected Discharge Plan and Services Expected Discharge Plan: [place] In-house Referral: Clinical Social Work Discharge Planning Services: CM Consult Post Acute Care Choice: [place] arrangements for the past 2 months: Single Family Home                 DME Arranged: N/A DME Agency: NA       HH Arranged: PT, RN, Social Work [employer] Agency: [place] Date [place] Agency Contacted: 05/28/21 Time [place]: [place] Representative spoke with at [place]: Tommi Rumps  Prior Living Arrangements/Services Living arrangements for the past 2 months: Single Family Home Lives with:: Self, Adult Children Patient language and need for interpreter reviewed:: Yes Do you feel safe going back to the place where you live?: Yes      Need for Family Participation in Patient Care: Yes (Comment) Care giver support system in place?: Yes (comment) Current home services: DME (home oxygen at 2L through [facility] Patient; rollator; 3N1) Criminal Activity/Legal Involvement Pertinent to  Current Situation/Hospitalization: No - Comment as needed  Activities of Daily Living      Permission Sought/Granted Permission sought to share information with : Family Supports Permission granted to share information with : Yes, Verbal Permission Granted  Share Information with NAME: Margaret Dyer     Permission granted to share info w Relationship: daughter  Permission granted to share info w Contact Information: [phone] or [phone]  Emotional Assessment Appearance:: Appears stated age Attitude/Demeanor/Rapport: Engaged Affect (typically observed): Accepting Orientation: : Oriented to Self, Oriented to  Time, Oriented to Place, Oriented to Situation Alcohol / Substance Use: Not Applicable Psych Involvement: No (comment)  Admission diagnosis:  SOB (shortness of breath) [R06.02] CHF exacerbation (HCC) [V67.2] Chronic diastolic congestive heart failure (HCC) [I50.32] AKI (acute kidney injury) ([place]) [N17.9] Patient Active Problem List   Diagnosis Date Noted   CHF exacerbation ([place]) 05/25/2021   Acute on chronic diastolic CHF (congestive heart failure) ([place]) 05/25/2021   Closed fracture of maxillary sinus ([place]) 07/10/2019   UTI (urinary tract infection) 07/03/2019   Food poisoning 07/03/2019   Bacteremia 07/03/2019   Exposure to COVID-19 virus 06/16/2019   Closed left hip fracture ([place]) 12/24/2018   Dysuria 12/17/2017   Dizziness 12/17/2017   Community acquired pneumonia 12/17/2017   Pneumonia 12/01/2017   Urinary incontinence 11/09/2017   Tic 11/09/2017   CKD (chronic kidney disease), stage III ([place]) 09/12/2017   Pulmonary hypertension ([place]) 09/12/2017   Chronic diastolic CHF (congestive heart failure) ([place]) 06/05/2017   Oral candida 05/29/2017   Vitamin D deficiency 05/29/2017   Chronic respiratory failure ([place]) 05/24/2017   Anxiety 05/24/2017   HCAP (healthcare-associated pneumonia) 05/22/2017   Right heart failure ([place]) 09/47/0962   Acute diastolic CHF  (congestive heart failure) (HCC)    Exertional dyspnea 05/13/2017   Hypertension 05/13/2017   Asthma 05/13/2017   DOE (dyspnea on exertion) 05/13/2017   Diabetes mellitus type 2, insulin dependent ([place]) 05/13/2017   Acute kidney injury ([place]) 05/13/2017   History of nephrectomy 05/13/2017   Hyperlipidemia LDL goal <70 05/13/2017   Liver cirrhosis secondary to NASH ([place]) 05/13/2017   Thrombocytopenia ([place]) 05/13/2017   Renal cell carcinoma 05/13/2017   Hypothyroidism 05/13/2017   Hypersplenism 05/13/2017   Osteoarthritis 05/13/2017   Overactive bladder 05/13/2017   URI, acute 02/22/2017   Abrasion, left knee, initial encounter 02/22/2017   CTS (carpal tunnel syndrome) 03/04/2016   Bilateral contusion of ribs 10/05/2015   Dental abscess 04/15/2014   Claudication, intermittent ([place]) 12/05/2013   Obesity (BMI 30-39.9) 07/22/2013   Back pain 05/26/2013   Platelets decreased ([place]) 05/26/2013   Breast pain, right 04/21/2013   Anxiety and depression 12/05/2012   Bronchitis 11/06/2012   HEMATURIA UNSPECIFIED 02/17/2011   Hyperlipidemia 09/08/2010   B12 DEFICIENCY 07/22/2010   DIZZINESS 07/04/2010   Essential hypertension 06/21/2010   GOUT, UNSPECIFIED 01/27/2010   NEPHRECTOMY, HX OF 12/02/2009   NECK PAIN, LEFT 09/04/2008   PARESTHESIA 09/02/2007   GOITER NOS 04/23/2007   Diabetes mellitus ([place]) 04/23/2007   Asthma 04/23/2007  Renal stone 04/23/2007   OSTEOPENIA 04/23/2007   CARCINOMA, RENAL CELL 08/10/1999   PCP:  Ann Held, DO Pharmacy:   [facility], [place] - [address] [place] [address] Phone: [phone] Fax: [phone]  [facility] [place], [address] AT [address] [place] [address] Phone: [phone] Fax: [phone]     Social Determinants of Health (SDOH) Interventions     Readmission Risk Interventions No flowsheet data found.

## 2021-05-28 NOTE — Progress Notes (Signed)
TRH night shift.  The nursing staff reports that the patient is complaining of burning sensation while urinating.  And urinalysis with reflex microscopic has been ordered.  Tennis Must, MD.

## 2021-05-28 NOTE — Progress Notes (Addendum)
PROGRESS NOTE    Margaret Dyer  EBX:[id number] DOB: 08/24/1935 DOA: 05/25/2021 PCP: Ann Held, DO   Brief Narrative:  HPI On 05/25/2021 by Dr. Toy Baker Margaret Dyer is a 86 y.o. female with medical history significant of  thrombocytopenia, DM2, HTN, CKD, chronic diastolic CHF , HLD, Liver Cirrhosis/NASH, pulmoanry HTN, renal cell CA sp nephrectomy Right heart failure,hypothyroidism, chronic respiratory failure on O2 2L, tobacco abuse    Presented with SOB for the past 4 days probably was going on a bit longer than that.  No chest pain associated she increase her oxygen to 2.75 L from 2.  She has not had any fever or cold symptoms. Patient contacted her primary cardiologist Dr. Debara Pickett who recommended increasing Lasix to 40 twice daily for 3 days she did take 1 extra dose but still short of breath and presented to emergency department. Per family she could barely finish a full sentence.  She has been having some swelling of her ankles as well as midsection.   Of note on June 15 patient was seen at urgent care and was treated with Bactrim for UTI.  Interim history Patient admitted with shortness of breath and CHF exacerbation.  Also found to have acute kidney injury.  Nephrology and cardiology consulted and appreciated.  Currently on IV Lasix. Assessment & Plan   Acute diastolic CHF exacerbation -Patient presenting with progressive shortness of breath and increased oxygen need -Chest x-ray independently reviewed, showed interstitial edema and vascular congestion -BNP 297.7 -Echocardiogram EF 60-65%, G2 DD -Patient was placed on IV Lasix 40 mg twice daily -Cardiology consulted and appreciated, and has transition patient to torsemide today -Monitor intake and output, daily weights  Acute kidney injury on chronic kidney disease, stage IIIb/solitary kidney with history of renal cell carcinoma -Possibly secondary to medications -Creatinin was 2.54 on admission, down to  1.78 today (baseline 1.2-1.5) -Patient was recently on Bactrim for urinary tract infection as well as diuretics and and losartan -Nephrology consulted and appreciated given that patient has a solitary kidney -Continue to monitor BMP  Essential hypertension -BP somewhat soft -Amlodipine and hydralazine held -Continue torsemide  Diabetes mellitus, type II -Hemoglobin A1c 6.3 -Continue insulin sliding scale and CBG monitoring  Pulmonary hypertension with chronic hypoxic respiratory failure -Patient uses 2 L of home oxygen at baseline- currently on 3L-Will attempt to wean -Patient should follow-up with pulmonology as an outpatient  Overactive bladder -Continue Ditropan  Thrombocytopenia -Chronic and stable  Liver cirrhosis/NASH -Given worsening creatinine, spironolactone held  Obesity -BMI 36.14 -Patient wanting to follow-up with her PCP for lifestyle modification  Hypothyroidism -TSH 0.243- low- however given acute illness, will not make a medication adjustments. -Patient should follow-up with her PCP for repeat thyroid function studies as an outpatient -Continue synthroid  Dysuria -Complains of burning with urination -UA obtained and unremarkable for infection x2  DVT Prophylaxis  SCDs  Code Status: Full  Family Communication: None at bedside  Disposition Plan:  Status is: Inpatient  Remains inpatient appropriate because:IV treatments appropriate due to intensity of illness or inability to take PO and Inpatient level of care appropriate due to severity of illness  Dispo: The patient is from: Home              Anticipated d/c is to:  TBD              Patient currently is not medically stable to d/c.   Difficult to place patient No   Consultants Cardiology Nephrology  Procedures  Echocardiogram  Antibiotics   Anti-infectives (From admission, onward)    None       Subjective:   Margaret Dyer seen and examined today.  Concerned that she will not be  able to follow-up with her physicians as an outpatient given transportation issues.  Continues to feel short of breath, more so today than yesterday.  Denies current chest pain or abdominal pain, nausea or vomiting, dizziness or headache.  Also concerned that her home medications especially for her blood pressure medications have not been restarted.   Objective:   Vitals:   05/27/21 1945 05/28/21 0034 05/28/21 0457 05/28/21 0841  BP: (!) 124/39 (!) 152/57 (!) 142/55 (!) 126/55  Pulse: (!) 59 70 69 66  Resp: _0 Temp: 98.6 F (37 C) 98.7 F (37.1 C) 98.3 F (36.8 C) 98 F (36.7 C)  TempSrc: Oral Oral Oral Oral  SpO2: 99% 99% 100% 100%  Weight:   90.3 kg   Height:        Intake/Output Summary (Last 24 hours) at 05/28/2021 1009 Last data filed at 05/28/2021 0000 Gross per 24 hour  Intake 949 ml  Output 1650 ml  Net -701 ml    Filed Weights   05/26/21 1530 05/27/21 0306 05/28/21 0457  Weight: 92 kg 90.7 kg 90.3 kg    Exam General: Well developed, chronically ill-appearing, NAD HEENT: NCAT, mucous membranes moist.  Cardiovascular: S1 S2 auscultated, RRR Respiratory: Diminished breath sounds, no wheezing Abdomen: Soft, obese, nontender, nondistended, + bowel sounds Extremities: warm dry without cyanosis clubbing. Trace LE edema Neuro: AAOx3, nonfocal Psych: appropriate mood and affect, Pleasant    Data Reviewed: I have personally reviewed following labs and imaging studies  CBC: Recent Labs  Lab 05/25/21 1523 05/26/21 0445 05/27/21 0322  WBC 6.1 7.2 5.6  NEUTROABS 4.6 5.3  --   HGB 10.1* 10.5* 10.1*  HCT 31.5* 32.4* 30.6*  MCV 94.3 95.9 93.3  PLT 92* 102* 101*    Basic Metabolic Panel: Recent Labs  Lab 05/25/21 1523 05/26/21 0445 05/27/21 0322 05/28/21 0313  NA 134* 136 136 139  K 5.1 4.8 4.6 4.7  CL 100 103 96* 102  CO2 _1 GLUCOSE 154* 148* 221* 139*  BUN 57* 62* 69* 64*  CREATININE 2.54* 2.35* 2.31* 1.78*  CALCIUM 8.7* 9.0 9.2 9.1   MG  --  2.3  --   --   PHOS  --  5.0* 4.6 3.6    GFR: Estimated Creatinine Clearance: 25.4 mL/min (A) (by C-G formula based on SCr of 1.78 mg/dL (H)). Liver Function Tests: Recent Labs  Lab 05/25/21 1523 05/26/21 0445 05/27/21 0322 05/28/21 0313  AST 17 18  --   --   ALT 17 19  --   --   ALKPHOS 48 50  --   --   BILITOT 0.8 0.7  --   --   PROT 6.1* 6.4*  --   --   ALBUMIN 3.3* 3.4* 3.2* 3.3*    No results for input(s): LIPASE, AMYLASE in the last 168 hours. No results for input(s): AMMONIA in the last 168 hours. Coagulation Profile: Recent Labs  Lab 05/25/21 1927  INR 1.2    Cardiac Enzymes: No results for input(s): CKTOTAL, CKMB, CKMBINDEX, TROPONINI in the last 168 hours. BNP (last 3 results) No results for input(s): PROBNP in the last 8760 hours. HbA1C: Recent Labs    05/25/21 1927  HGBA1C 6.3*    CBG:  Recent Labs  Lab 05/27/21 1641 05/27/21 1947 05/28/21 0029 05/28/21 0458 05/28/21 0810  GLUCAP 269* 222* 248* 126* 168*    Lipid Profile: Recent Labs    05/27/21 0322  CHOL 143  HDL 31*  LDLCALC 87  TRIG 124  CHOLHDL 4.6   Thyroid Function Tests: Recent Labs    05/26/21 0445  TSH 0.243*    Anemia Panel: No results for input(s): VITAMINB12, FOLATE, FERRITIN, TIBC, IRON, RETICCTPCT in the last 72 hours. Urine analysis:    Component Value Date/Time   COLORURINE YELLOW 05/28/2021 0533   APPEARANCEUR CLEAR 05/28/2021 0533   LABSPEC 1.013 05/28/2021 0533   PHURINE 5.0 05/28/2021 0533   GLUCOSEU 50 (A) 05/28/2021 0533   GLUCOSEU NEGATIVE 07/21/2019 1614   HGBUR NEGATIVE 05/28/2021 0533   HGBUR large 02/17/2011 1432   BILIRUBINUR NEGATIVE 05/28/2021 0533   BILIRUBINUR negative 05/18/2021 1935   BILIRUBINUR neg 01/31/2019 1624   KETONESUR NEGATIVE 05/28/2021 0533   PROTEINUR NEGATIVE 05/28/2021 0533   UROBILINOGEN 0.2 05/18/2021 1935   UROBILINOGEN 0.2 07/21/2019 1614   NITRITE NEGATIVE 05/28/2021 0533   LEUKOCYTESUR NEGATIVE  05/28/2021 0533   Sepsis Labs: _0 (procalcitonin:4,lacticidven:4)  ) Recent Results (from the past 240 hour(s))  Urine Culture     Status: Abnormal   Collection Time: 05/18/21  7:43 PM   Specimen: Urine, Random  Result Value Ref Range Status   Specimen Description URINE, RANDOM  Final   Special Requests   Final    NONE Performed at [facility], [place] [address] [place], [place] [address]    Culture >=100,000 COLONIES/mL KLEBSIELLA PNEUMONIAE (A)  Final   Report Status 05/22/2021 FINAL  Final   Organism ID, Bacteria KLEBSIELLA PNEUMONIAE (A)  Final      Susceptibility   Klebsiella pneumoniae - MIC*    AMPICILLIN RESISTANT Resistant     CEFAZOLIN <=4 SENSITIVE Sensitive     CEFEPIME <=0.12 SENSITIVE Sensitive     CEFTRIAXONE <=0.25 SENSITIVE Sensitive     CIPROFLOXACIN <=0.25 SENSITIVE Sensitive     GENTAMICIN <=1 SENSITIVE Sensitive     IMIPENEM <=0.25 SENSITIVE Sensitive     NITROFURANTOIN 32 SENSITIVE Sensitive     TRIMETH/SULFA <=20 SENSITIVE Sensitive     AMPICILLIN/SULBACTAM <=2 SENSITIVE Sensitive     PIP/TAZO <=4 SENSITIVE Sensitive     * >=100,000 COLONIES/mL KLEBSIELLA PNEUMONIAE  SARS CORONAVIRUS 2 (TAT 6-24 HRS) Nasopharyngeal Nasopharyngeal Swab     Status: None   Collection Time: 05/25/21  6:18 PM   Specimen: Nasopharyngeal Swab  Result Value Ref Range Status   SARS Coronavirus 2 NEGATIVE NEGATIVE Final    Comment: (NOTE) SARS-CoV-2 target nucleic acids are NOT DETECTED.  The SARS-CoV-2 RNA is generally detectable in upper and lower respiratory specimens during the acute phase of infection. Negative results do not preclude SARS-CoV-2 infection, do not rule out co-infections with other pathogens, and should not be used as the sole basis for treatment or other patient management decisions. Negative results must be combined with clinical observations, patient history, and epidemiological information. The expected result is Negative.  Fact Sheet  for Patients: SugarRoll.be  Fact Sheet for Healthcare Providers: https://www.woods-mathews.com/  This test is not yet approved or cleared by the [place] FDA and  has been authorized for detection and/or diagnosis of SARS-CoV-2 by FDA under an Emergency Use Authorization (EUA). This EUA will remain  in effect (meaning this test can be used) for the duration of the COVID-19 declaration under Se ction 564(b)(1) of  the Act, 21 U.S.C. section 360bbb-3(b)(1), unless the authorization is terminated or revoked sooner.  Performed at [facility], [place] [address] [place], [place] [address]       Radiology Studies: US RENAL  Result Date: 05/26/2021 CLINICAL DATA:  Acute renal insufficiency.  Prior left nephrectomy. EXAM: RENAL / URINARY TRACT ULTRASOUND COMPLETE COMPARISON:  CT 07/02/2019. FINDINGS: Right Kidney: Renal measurements: 14.6 x 5.5 x 5.4 cm = volume: 227.8 mL. Cortical thinning. Echogenicity within normal limits. No mass or hydronephrosis visualized. Left Kidney: Renal measurements: Prior nephrectomy. Bladder: Appears normal for degree of bladder distention. Right ureteral jet not identified. Other: None. IMPRESSION: 1. Right renal cortical thinning. No hydronephrosis or bladder distention. Right ureteral jet not identified. 2.  Prior left nephrectomy. Electronically Signed   By: Marcello Moores  Register   On: 05/26/2021 13:38   ECHOCARDIOGRAM COMPLETE  Result Date: 05/26/2021    ECHOCARDIOGRAM REPORT   Patient Name:   ZYNIAH FERRAIOLO Date of Exam: 05/26/2021 Medical Rec #:  [id number]     Height:       63.0 in Accession #:    [id number]    Weight:       204.0 lb Date of Birth:  [birth year]     BSA:          1.950 m Patient Age:    86 years      BP:           139/59 mmHg Patient Gender: F             HR:           65 bpm. Exam Location:  Inpatient Procedure: 2D Echo, Color Doppler and Cardiac Doppler Indications:    CHF-Acute Diastolic V25.36   History:        Patient has prior history of Echocardiogram examinations, most                 recent 05/13/2017. CHF, Pulmonary HTN; Risk Factors:Diabetes,                 Dyslipidemia and Hypertension.  Sonographer:    Bernadene Person RDCS Referring Phys: [place]  1. Left ventricular ejection fraction, by estimation, is 60 to 65%. The left ventricle has normal function. The left ventricle has no regional wall motion abnormalities. Left ventricular diastolic parameters are consistent with Grade II diastolic dysfunction (pseudonormalization).  2. Right ventricular systolic function is normal. The right ventricular size is normal. Tricuspid regurgitation signal is inadequate for assessing PA pressure.  3. The mitral valve is normal in structure. Trivial mitral valve regurgitation. No evidence of mitral stenosis.  4. The aortic valve is tricuspid. Aortic valve regurgitation is not visualized. No aortic stenosis is present.  5. The inferior vena cava is normal in size with greater than 50% respiratory variability, suggesting right atrial pressure of 3 mmHg. FINDINGS  Left Ventricle: Left ventricular ejection fraction, by estimation, is 60 to 65%. The left ventricle has normal function. The left ventricle has no regional wall motion abnormalities. The left ventricular internal cavity size was normal in size. There is  no left ventricular hypertrophy. Left ventricular diastolic parameters are consistent with Grade II diastolic dysfunction (pseudonormalization). Right Ventricle: The right ventricular size is normal. Right ventricular systolic function is normal. Tricuspid regurgitation signal is inadequate for assessing PA pressure. The tricuspid regurgitant velocity is 2.29 m/s, and with an assumed right atrial  pressure of 8 mmHg, the estimated right ventricular systolic pressure is  29.0 mmHg. Left Atrium: Left atrial size was normal in size. Right Atrium: Right atrial size was normal in size.  Pericardium: Trivial pericardial effusion is present. Mitral Valve: The mitral valve is normal in structure. Trivial mitral valve regurgitation. No evidence of mitral valve stenosis. Tricuspid Valve: The tricuspid valve is normal in structure. Tricuspid valve regurgitation is trivial. No evidence of tricuspid stenosis. Aortic Valve: The aortic valve is tricuspid. Aortic valve regurgitation is not visualized. No aortic stenosis is present. Pulmonic Valve: The pulmonic valve was normal in structure. Pulmonic valve regurgitation is not visualized. No evidence of pulmonic stenosis. Aorta: The aortic root is normal in size and structure. Venous: The inferior vena cava is normal in size with greater than 50% respiratory variability, suggesting right atrial pressure of 3 mmHg. IAS/Shunts: The interatrial septum was not well visualized.  LEFT VENTRICLE PLAX 2D LVIDd:         5.10 cm  Diastology LVIDs:         3.20 cm  LV e' medial:    7.46 cm/s LV PW:         1.10 cm  LV E/e' medial:  15.7 LV IVS:        1.00 cm  LV e' lateral:   10.10 cm/s LVOT diam:     1.90 cm  LV E/e' lateral: 11.6 LV SV:         79 LV SV Index:   40 LVOT Area:     2.84 cm  RIGHT VENTRICLE RV S prime:     17.70 cm/s TAPSE (M-mode): 2.1 cm LEFT ATRIUM             Index       RIGHT ATRIUM           Index LA diam:        3.90 cm 2.00 cm/m  RA Area:     16.10 cm LA Vol (A2C):   49.9 ml 25.59 ml/m RA Volume:   38.20 ml  19.59 ml/m LA Vol (A4C):   54.7 ml 28.05 ml/m LA Biplane Vol: 52.7 ml 27.03 ml/m  AORTIC VALVE LVOT Vmax:   114.00 cm/s LVOT Vmean:  76.800 cm/s LVOT VTI:    0.277 m  AORTA Ao Root diam: 3.40 cm Ao Asc diam:  3.30 cm MITRAL VALVE                TRICUSPID VALVE MV Area (PHT): 2.99 cm     TR Peak grad:   21.0 mmHg MV Decel Time: 254 msec     TR Vmax:        229.00 cm/s MV E velocity: 117.00 cm/s MV A velocity: 70.40 cm/s   SHUNTS MV E/A ratio:  1.66         Systemic VTI:  0.28 m                             Systemic Diam: 1.90 cm Kirk Ruths MD Electronically signed by Kirk Ruths MD Signature Date/Time: 05/26/2021/12:58:06 PM    Final      Scheduled Meds:  aspirin  81 mg Oral Daily   ezetimibe  10 mg Oral Daily   And   simvastatin  40 mg Oral q1800   insulin aspart  0-9 Units Subcutaneous Q4H   insulin glargine  25 Units Subcutaneous QHS   levothyroxine  25 mcg Oral Q0600   metoprolol tartrate  25 mg Oral BID  oxybutynin  5 mg Oral Daily   sodium chloride flush  3 mL Intravenous Q12H   torsemide  20 mg Oral Daily   Continuous Infusions:  sodium chloride       LOS: 3 days   Time Spent in minutes  30 minutes  Christin Mccreedy D.O. on 05/28/2021 at 10:09 AM  Between 7am to 7pm - Please see pager noted on [email or web address]  After 7pm go to [email or web address]  And look for the night coverage person covering for me after hours  [facility] Office  [phone]

## 2021-05-28 NOTE — Progress Notes (Signed)
[facility] ROUNDING NOTE   Subjective:   Interval History: This is an 86 year old lady with past medical history of diabetes mellitus type 2 hypertension chronic kidney disease stage III, pulmonary hypertension renal cell carcinoma status post nephrectomy 2000.  She has a history of congestive heart failure with diastolic dysfunction.  She had a 2D echo performed in 2018.  Echo has been ordered for 05/26/2021.  Last right-sided heart catheterization was performed in 2018.  Her cardiologist Dr. Glenetta Hew.  She presented to the emergency room with increasing shortness of breath and lower extremity swelling.  Baseline creatinine about 1.5 mg/dL  Blood pressure 126/56 pulse 67 temperature 98 O2 sats 90% 3 L nasal cannula  Sodium 139 potassium 4.7 chloride 102 CO2 31 creatinine 1.7 BUN 64 calcium 9.1 albumin 3.3 phosphorus 3.6  Urine output 1.2 L 05/27/2021  Demadex 20 mg daily   Objective:  Vital signs in last 24 hours:  Temp:  [98 F (36.7 C)-98.7 F (37.1 C)] 98 F (36.7 C) (06/25 0841) Pulse Rate:  [59-70] 66 (06/25 0841) Resp:  [17-19] 17 (06/25 0841) BP: (124-152)/(39-57) 126/55 (06/25 0841) SpO2:  [98 %-100 %] 100 % (06/25 0841) Weight:  [90.3 kg] 90.3 kg (06/25 0457)  Weight change: -1.769 kg Filed Weights   05/26/21 1530 05/27/21 0306 05/28/21 0457  Weight: 92 kg 90.7 kg 90.3 kg    Intake/Output: I/O last 3 completed shifts: In: 1975 [P.O.:1426] Out: 2350 [Urine:2350]   Intake/Output this shift:  Total I/O In: 120 [P.O.:120] Out: -   General:   Alert,  Well-developed, nondistressed chronically ill Head:  Normocephalic and atraumatic. Eyes:  Sclera clear, no icterus.   Conjunctiva pink.  Moist mucous membranes Ears:  Normal auditory acuity. Nose:  No deformity, discharge,  or lesions. Mouth:  No deformity or lesions, dentition normal. Neck:  Supple; no masses or thyromegaly. JVP not elevated Lungs:  Clear throughout to auscultation.   No  wheezes, crackles, or rhonchi. No acute distress. Heart:  Regular rate and rhythm; no murmurs, clicks, rubs,  or gallops. Abdomen:  Soft, nontender and nondistended. No masses, hepatosplenomegaly or hernias noted. Normal bowel sounds, without guarding, and without rebound.   Msk:  Symmetrical without gross deformities. Normal posture. Pulses:  No carotid, renal, femoral bruits. DP   Basic Metabolic Panel: Recent Labs  Lab 05/25/21 1523 05/26/21 0445 05/27/21 0322 05/28/21 0313  NA 134* 136 136 139  K 5.1 4.8 4.6 4.7  CL 100 103 96* 102  CO2 _0 GLUCOSE 154* 148* 221* 139*  BUN 57* 62* 69* 64*  CREATININE 2.54* 2.35* 2.31* 1.78*  CALCIUM 8.7* 9.0 9.2 9.1  MG  --  2.3  --   --   PHOS  --  5.0* 4.6 3.6     Liver Function Tests: Recent Labs  Lab 05/25/21 1523 05/26/21 0445 05/27/21 0322 05/28/21 0313  AST 17 18  --   --   ALT 17 19  --   --   ALKPHOS 48 50  --   --   BILITOT 0.8 0.7  --   --   PROT 6.1* 6.4*  --   --   ALBUMIN 3.3* 3.4* 3.2* 3.3*    No results for input(s): LIPASE, AMYLASE in the last 168 hours. No results for input(s): AMMONIA in the last 168 hours.  CBC: Recent Labs  Lab 05/25/21 1523 05/26/21 0445 05/27/21 0322  WBC 6.1 7.2 5.6  NEUTROABS 4.6 5.3  --  HGB 10.1* 10.5* 10.1*  HCT 31.5* 32.4* 30.6*  MCV 94.3 95.9 93.3  PLT 92* 102* 101*     Cardiac Enzymes: No results for input(s): CKTOTAL, CKMB, CKMBINDEX, TROPONINI in the last 168 hours.  BNP: Invalid input(s): POCBNP  CBG: Recent Labs  Lab 05/27/21 1641 05/27/21 1947 05/28/21 0029 05/28/21 0458 05/28/21 0810  GLUCAP 269* 222* 248* 126* 168*     Microbiology: Results for orders placed or performed during the hospital encounter of 05/25/21  SARS CORONAVIRUS 2 (TAT 6-24 HRS) Nasopharyngeal Nasopharyngeal Swab     Status: None   Collection Time: 05/25/21  6:18 PM   Specimen: Nasopharyngeal Swab  Result Value Ref Range Status   SARS Coronavirus 2 NEGATIVE  NEGATIVE Final    Comment: (NOTE) SARS-CoV-2 target nucleic acids are NOT DETECTED.  The SARS-CoV-2 RNA is generally detectable in upper and lower respiratory specimens during the acute phase of infection. Negative results do not preclude SARS-CoV-2 infection, do not rule out co-infections with other pathogens, and should not be used as the sole basis for treatment or other patient management decisions. Negative results must be combined with clinical observations, patient history, and epidemiological information. The expected result is Negative.  Fact Sheet for Patients: SugarRoll.be  Fact Sheet for Healthcare Providers: https://www.woods-mathews.com/  This test is not yet approved or cleared by the [place] FDA and  has been authorized for detection and/or diagnosis of SARS-CoV-2 by FDA under an Emergency Use Authorization (EUA). This EUA will remain  in effect (meaning this test can be used) for the duration of the COVID-19 declaration under Se ction 564(b)(1) of the Act, 21 U.S.C. section 360bbb-3(b)(1), unless the authorization is terminated or revoked sooner.  Performed at [facility], [place] [address], [place], [place] [address]     Coagulation Studies: Recent Labs    05/25/21 1927  LABPROT 15.6*  INR 1.2     Urinalysis: Recent Labs    05/26/21 1205 05/28/21 0533  COLORURINE YELLOW YELLOW  LABSPEC 1.011 1.013  PHURINE 5.0 5.0  GLUCOSEU 50* 50*  HGBUR NEGATIVE NEGATIVE  BILIRUBINUR NEGATIVE NEGATIVE  KETONESUR NEGATIVE NEGATIVE  PROTEINUR NEGATIVE NEGATIVE  NITRITE NEGATIVE NEGATIVE  LEUKOCYTESUR NEGATIVE NEGATIVE       Imaging: US RENAL  Result Date: 05/26/2021 CLINICAL DATA:  Acute renal insufficiency.  Prior left nephrectomy. EXAM: RENAL / URINARY TRACT ULTRASOUND COMPLETE COMPARISON:  CT 07/02/2019. FINDINGS: Right Kidney: Renal measurements: 14.6 x 5.5 x 5.4 cm = volume: 227.8 mL. Cortical  thinning. Echogenicity within normal limits. No mass or hydronephrosis visualized. Left Kidney: Renal measurements: Prior nephrectomy. Bladder: Appears normal for degree of bladder distention. Right ureteral jet not identified. Other: None. IMPRESSION: 1. Right renal cortical thinning. No hydronephrosis or bladder distention. Right ureteral jet not identified. 2.  Prior left nephrectomy. Electronically Signed   By: Marcello Moores  Register   On: 05/26/2021 13:38   ECHOCARDIOGRAM COMPLETE  Result Date: 05/26/2021    ECHOCARDIOGRAM REPORT   Patient Name:   Margaret Dyer Date of Exam: 05/26/2021 Medical Rec #:  [id number]     Height:       63.0 in Accession #:    [id number]    Weight:       204.0 lb Date of Birth:  [birth year]     BSA:          1.950 m Patient Age:    89 years      BP:  139/59 mmHg Patient Gender: F             HR:           65 bpm. Exam Location:  Inpatient Procedure: 2D Echo, Color Doppler and Cardiac Doppler Indications:    CHF-Acute Diastolic X21.19  History:        Patient has prior history of Echocardiogram examinations, most                 recent 05/13/2017. CHF, Pulmonary HTN; Risk Factors:Diabetes,                 Dyslipidemia and Hypertension.  Sonographer:    Bernadene Person RDCS Referring Phys: [place]  1. Left ventricular ejection fraction, by estimation, is 60 to 65%. The left ventricle has normal function. The left ventricle has no regional wall motion abnormalities. Left ventricular diastolic parameters are consistent with Grade II diastolic dysfunction (pseudonormalization).  2. Right ventricular systolic function is normal. The right ventricular size is normal. Tricuspid regurgitation signal is inadequate for assessing PA pressure.  3. The mitral valve is normal in structure. Trivial mitral valve regurgitation. No evidence of mitral stenosis.  4. The aortic valve is tricuspid. Aortic valve regurgitation is not visualized. No aortic stenosis is present.  5.  The inferior vena cava is normal in size with greater than 50% respiratory variability, suggesting right atrial pressure of 3 mmHg. FINDINGS  Left Ventricle: Left ventricular ejection fraction, by estimation, is 60 to 65%. The left ventricle has normal function. The left ventricle has no regional wall motion abnormalities. The left ventricular internal cavity size was normal in size. There is  no left ventricular hypertrophy. Left ventricular diastolic parameters are consistent with Grade II diastolic dysfunction (pseudonormalization). Right Ventricle: The right ventricular size is normal. Right ventricular systolic function is normal. Tricuspid regurgitation signal is inadequate for assessing PA pressure. The tricuspid regurgitant velocity is 2.29 m/s, and with an assumed right atrial  pressure of 8 mmHg, the estimated right ventricular systolic pressure is 41.7 mmHg. Left Atrium: Left atrial size was normal in size. Right Atrium: Right atrial size was normal in size. Pericardium: Trivial pericardial effusion is present. Mitral Valve: The mitral valve is normal in structure. Trivial mitral valve regurgitation. No evidence of mitral valve stenosis. Tricuspid Valve: The tricuspid valve is normal in structure. Tricuspid valve regurgitation is trivial. No evidence of tricuspid stenosis. Aortic Valve: The aortic valve is tricuspid. Aortic valve regurgitation is not visualized. No aortic stenosis is present. Pulmonic Valve: The pulmonic valve was normal in structure. Pulmonic valve regurgitation is not visualized. No evidence of pulmonic stenosis. Aorta: The aortic root is normal in size and structure. Venous: The inferior vena cava is normal in size with greater than 50% respiratory variability, suggesting right atrial pressure of 3 mmHg. IAS/Shunts: The interatrial septum was not well visualized.  LEFT VENTRICLE PLAX 2D LVIDd:         5.10 cm  Diastology LVIDs:         3.20 cm  LV e' medial:    7.46 cm/s LV PW:          1.10 cm  LV E/e' medial:  15.7 LV IVS:        1.00 cm  LV e' lateral:   10.10 cm/s LVOT diam:     1.90 cm  LV E/e' lateral: 11.6 LV SV:         79 LV SV Index:   40 LVOT Area:  2.84 cm  RIGHT VENTRICLE RV S prime:     17.70 cm/s TAPSE (M-mode): 2.1 cm LEFT ATRIUM             Index       RIGHT ATRIUM           Index LA diam:        3.90 cm 2.00 cm/m  RA Area:     16.10 cm LA Vol (A2C):   49.9 ml 25.59 ml/m RA Volume:   38.20 ml  19.59 ml/m LA Vol (A4C):   54.7 ml 28.05 ml/m LA Biplane Vol: 52.7 ml 27.03 ml/m  AORTIC VALVE LVOT Vmax:   114.00 cm/s LVOT Vmean:  76.800 cm/s LVOT VTI:    0.277 m  AORTA Ao Root diam: 3.40 cm Ao Asc diam:  3.30 cm MITRAL VALVE                TRICUSPID VALVE MV Area (PHT): 2.99 cm     TR Peak grad:   21.0 mmHg MV Decel Time: 254 msec     TR Vmax:        229.00 cm/s MV E velocity: 117.00 cm/s MV A velocity: 70.40 cm/s   SHUNTS MV E/A ratio:  1.66         Systemic VTI:  0.28 m                             Systemic Diam: 1.90 cm Kirk Ruths MD Electronically signed by Kirk Ruths MD Signature Date/Time: 05/26/2021/12:58:06 PM    Final      Medications:    sodium chloride      aspirin  81 mg Oral Daily   ezetimibe  10 mg Oral Daily   And   simvastatin  40 mg Oral q1800   insulin aspart  0-9 Units Subcutaneous Q4H   insulin glargine  25 Units Subcutaneous QHS   levothyroxine  25 mcg Oral Q0600   metoprolol tartrate  25 mg Oral BID   oxybutynin  5 mg Oral Daily   sodium chloride flush  3 mL Intravenous Q12H   torsemide  20 mg Oral Daily   sodium chloride, acetaminophen **OR** acetaminophen, albuterol, HYDROcodone-acetaminophen, sodium chloride flush, traMADol  Assessment/ Plan:  Acute kidney injury.  Appears to be in the setting of increasing shortness of breath lower extremity edema and increasing dose of Lasix.  She has a solitary functioning kidney.  Renal ultrasound showed left prior nephrectomy right without hydronephrosis.  Urinalysis without  proteinuria.  Continue to avoid nephrotoxins no IV contrast no ACE inhibitor's ARB's try to avoid hemodynamic instability.  Renally adjust medications.  Check daily renal panel monitor I's and O's closely.  Baseline creatinine about 1.5 mg/dL.  We will sign off patient creatinine approaching baseline.  Continue Demadex 20 mg daily ANEMIA-does not appear to be an issue at this point. MBD-does not appear to be an issue at this point. HTN/VOL-transition to oral torsemide continue to monitor I's and O's.  2D echo shows diastolic dysfunction     LOS: 3 Sherril Croon _0 _1 :19 AM

## 2021-05-29 LAB — RENAL FUNCTION PANEL
Albumin: 3.2 g/dL — ABNORMAL LOW (ref 3.5–5.0)
Anion gap: 7 (ref 5–15)
BUN: 53 mg/dL — ABNORMAL HIGH (ref 8–23)
CO2: 33 mmol/L — ABNORMAL HIGH (ref 22–32)
Calcium: 9.1 mg/dL (ref 8.9–10.3)
Chloride: 102 mmol/L (ref 98–111)
Creatinine, Ser: 1.54 mg/dL — ABNORMAL HIGH (ref 0.44–1.00)
GFR, Estimated: 33 mL/min — ABNORMAL LOW (ref >60)
Glucose, Bld: 103 mg/dL — ABNORMAL HIGH (ref 70–99)
Phosphorus: 3.8 mg/dL (ref 2.5–4.6)
Potassium: 4.3 mmol/L (ref 3.5–5.1)
Sodium: 142 mmol/L (ref 135–145)

## 2021-05-29 LAB — GLUCOSE, CAPILLARY
Glucose-Capillary: 101 mg/dL — ABNORMAL HIGH (ref 70–99)
Glucose-Capillary: 220 mg/dL — ABNORMAL HIGH (ref 70–99)
Glucose-Capillary: 271 mg/dL — ABNORMAL HIGH (ref 70–99)
Glucose-Capillary: 274 mg/dL — ABNORMAL HIGH (ref 70–99)

## 2021-05-29 MED ORDER — HYDRALAZINE HCL 25 MG PO TABS
25.0000 mg | ORAL_TABLET | Freq: Three times a day (TID) | ORAL | Status: DC
  Administered 2021-05-29 (×2): 25 mg via ORAL
  Filled 2021-05-29 (×2): qty 1

## 2021-05-29 MED ORDER — EZETIMIBE-SIMVASTATIN 10-40 MG PO TABS: 1.0000 | ORAL_TABLET | Freq: Every day | ORAL | 0 refills | Status: DC

## 2021-05-29 MED ORDER — HYDRALAZINE HCL 50 MG PO TABS: 50.0000 mg | ORAL_TABLET | Freq: Four times a day (QID) | ORAL | Status: DC

## 2021-05-29 MED ORDER — HYDRALAZINE HCL 25 MG PO TABS: 25.0000 mg | ORAL_TABLET | Freq: Three times a day (TID) | ORAL | 0 refills | Status: DC

## 2021-05-29 MED ORDER — TORSEMIDE 20 MG PO TABS: 20.0000 mg | ORAL_TABLET | Freq: Every day | ORAL | 0 refills | Status: DC

## 2021-05-29 NOTE — TOC Transition Note (Signed)
Transition of Care [facility]) - CM/SW Discharge Note   Patient Details  Name: Margaret Dyer MRN: [id number] Date of Birth: 06/22/1935  [facility] CM/SW Contact:  Bartholomew Crews, RN Phone Number: [phone] 05/29/2021, 11:52 AM   Clinical Narrative:     Patient to transition home today. Spoke to patient's daughter, [name], on the phone. Spoke with patient at the bedside. PTAR arranged for 2pm pick up. [facility] notified of discharge - [place] orders placed by MD. Home oxygen increased to 3L - Message left for [facility] and faxed order and DC summary. Medications and HF teaching done with both patient and [name]. No further TOC needs identified.   Final next level of care: [place] Barriers to Discharge: No Barriers Identified   Patient Goals and CMS Choice Patient states their goals for this hospitalization and ongoing recovery are:: home with daughter CMS Medicare.gov Compare Post Acute Care list provided to:: Patient Choice offered to / list presented to : Patient  Discharge Placement                       Discharge Plan and Services In-house Referral: Clinical Social Work Discharge Planning Services: CM Consult Post Acute Care Choice: Home Health          DME Arranged: Oxygen DME Agency: Other - Comment ([facility] Patient) Date DME Agency Contacted: 05/29/21 Time DME Agency Contacted: 19 Representative spoke with at DME Agency: faxed order and DC summary HH Arranged: RN, PT, Social Work [employer] Agency: [place] Date [facility] Agency Contacted: 05/29/21 Time [place]: 1030 Representative spoke with at [place]: [place] ([place]) Interventions     Readmission Risk Interventions No flowsheet data found.

## 2021-05-29 NOTE — Discharge Instructions (Signed)
   You should avoid the antibiotic- Bactrim.  You should also change your pads frequently.

## 2021-05-29 NOTE — Discharge Summary (Signed)
Physician Discharge Summary  Margaret Dyer TZG:[id number] DOB: 12-08-1934 DOA: 05/25/2021  PCP: Ann Held, DO  Admit date: 05/25/2021 Discharge date: 05/29/2021  Time spent: 45 minutes  Recommendations for Outpatient Follow-up:  Patient will be discharged to home with home health services.  Patient will need to follow up with primary care provider within one week of discharge.  Follow up with cardiology and pulmonology. Patient should continue medications as prescribed.  Patient should follow a heart healthy diet.   Discharge Diagnoses:  Acute diastolic CHF exacerbation Acute kidney injury on chronic kidney disease, stage IIIb/solitary kidney with history of renal cell carcinoma Essential hypertension Diabetes mellitus, type II Pulmonary hypertension with chronic hypoxic respiratory failure Overactive bladder Thrombocytopenia Liver cirrhosis/NASH Obesity Hypothyroidism Dysuria  Discharge Condition: Stable  Diet recommendation: heart healthy  Filed Weights   05/27/21 0306 05/28/21 0457 05/29/21 0500  Weight: 90.7 kg 90.3 kg 91.1 kg    History of present illness:  On 05/25/2021 by Dr. Toy Baker Margaret Dyer is a 86 y.o. female with medical history significant of  thrombocytopenia, DM2, HTN, CKD, chronic diastolic CHF , HLD, Liver Cirrhosis/NASH, pulmoanry HTN, renal cell CA sp nephrectomy Right heart failure,hypothyroidism, chronic respiratory failure on O2 2L, tobacco abuse    Presented with SOB for the past 4 days probably was going on a bit longer than that.  No chest pain associated she increase her oxygen to 2.75 L from 2.  She has not had any fever or cold symptoms. Patient contacted her primary cardiologist Dr. Debara Pickett who recommended increasing Lasix to 40 twice daily for 3 days she did take 1 extra dose but still short of breath and presented to emergency department. Per family she could barely finish a full sentence.  She has been having some swelling  of her ankles as well as midsection.   Of note on June 15 patient was seen at urgent care and was treated with Bactrim for UTI.  Hospital Course:  Acute diastolic CHF exacerbation -Patient presenting with progressive shortness of breath and increased oxygen need -Chest x-ray independently reviewed, showed interstitial edema and vascular congestion -BNP 297.7 -Echocardiogram EF 60-65%, G2 DD -Patient was placed on IV Lasix 40 mg twice daily -Cardiology consulted and appreciated, patient was transitioned to torsemide -Monitor intake and output, daily weights -Patient will need to follow-up with cardiology   Acute kidney injury on chronic kidney disease, stage IIIb/solitary kidney with history of renal cell carcinoma -Possibly secondary to medications -Creatinin was 2.54 on admission, down to 1.5 today (baseline 1.2-1.5) -Patient was recently on Bactrim for urinary tract infection as well as diuretics and and losartan -Nephrology consulted and appreciated given that patient has a solitary kidney   Essential hypertension -Amlodipine and hydralazine held on admission as BP was soft -Have discontinued losartan, spironolactone as well as amlodipine -Dose of hydralazine decreased to 25 mg every 8 hours -Continue torsemide   Diabetes mellitus, type II -Hemoglobin A1c 6.3 -Continue insulin sliding scale and CBG monitoring   Pulmonary hypertension with chronic hypoxic respiratory failure -Patient uses 2 L of home oxygen at baseline- on 3L she maintains O2 sats at 100%. Would recommend using 2L at rest and 3 to 3.5L with ambulation. -Patient should follow-up with pulmonology as an outpatient   Overactive bladder -Continue Ditropan   Thrombocytopenia -Chronic and stable   Liver cirrhosis/NASH -Given worsening creatinine, spironolactone held   Obesity -BMI 36.14 -Patient wanting to follow-up with her PCP for lifestyle modification   Hypothyroidism -  TSH 0.243- low- however given  acute illness, will not make a medication adjustments. -Patient should follow-up with her PCP for repeat thyroid function studies as an outpatient -Continue synthroid   Dysuria -Complains of burning with urination -UA obtained and unremarkable for infection x2 -discussed proper hygiene with patient and needing to change out pads frequently when soiled  Physical deconditioning -PT recommended home health  Consultants Cardiology Nephrology   Procedures Echocardiogram    Discharge Exam: Vitals:   05/29/21 0400 05/29/21 0858  BP: (!) 137/55 (!) 146/50  Pulse: 61 66  Resp:  18  Temp: 98.2 F (36.8 C) 97.6 F (36.4 C)  SpO2: 100% 100%    General: Well developed, chronically ill appearing, NAD HEENT: NCAT, mucous membranes moist. Cardiovascular: S1 S2 auscultated, RRR Respiratory: Clear to auscultation bilaterally with equal chest rise Abdomen: Soft, obese, nontender, nondistended, + bowel sounds Extremities: warm dry without cyanosis clubbing or edema Neuro: AAOx3, nonfocal Psych: Appropriate mood and affect  Discharge Instructions Discharge Instructions     Discharge instructions   Complete by: As directed    Patient will need to follow up with primary care provider within one week of discharge.  Follow up with cardiology and pulmonology. Patient should continue medications as prescribed.  Patient should follow a heart healthy/carb modified diet.   Increase activity slowly   Complete by: As directed       Allergies as of 05/29/2021       Reactions   Other Other (See Comments)   Patient has hemorrhaged at least 4 times in her lifetime   Cefuroxime Axetil Nausea Only   Upset stomach   Ciprofloxacin Nausea Only   Upset stomach        Medication List     STOP taking these medications    amLODipine 5 MG tablet Commonly known as: NORVASC   furosemide 40 MG tablet Commonly known as: LASIX   losartan 50 MG tablet Commonly known as: COZAAR    spironolactone 25 MG tablet Commonly known as: ALDACTONE   sulfamethoxazole-trimethoprim 800-160 MG tablet Commonly known as: BACTRIM DS       TAKE these medications    Apidra SoloStar 100 UNIT/ML Solostar Pen Generic drug: insulin glulisine Inject 10-15 Units into the skin 3 (three) times daily before meals.   aspirin 81 MG EC tablet Take 1 tablet (81 mg total) by mouth daily.   CENTRUM SILVER ULTRA WOMENS PO Take 1 tablet by mouth at bedtime.   docusate sodium 100 MG capsule Commonly known as: COLACE Take 1 capsule (100 mg total) by mouth 2 (two) times daily.   ezetimibe-simvastatin 10-40 MG tablet Commonly known as: VYTORIN Take 1 tablet by mouth daily.   hydrALAZINE 25 MG tablet Commonly known as: APRESOLINE Take 1 tablet (25 mg total) by mouth every 8 (eight) hours. What changed:  how much to take when to take this   insulin glargine 100 UNIT/ML injection Commonly known as: LANTUS Inject 0.25 mLs (25 Units total) into the skin at bedtime.   levothyroxine 25 MCG tablet Commonly known as: SYNTHROID take 1 tablet by mouth daily What changed: when to take this   metoprolol tartrate 25 MG tablet Commonly known as: LOPRESSOR Take 1 tablet (25 mg total) by mouth 2 (two) times daily.   oxybutynin 5 MG tablet Commonly known as: DITROPAN TAKE 1 TABLET BY MOUTH DAILY   potassium chloride SA 20 MEQ tablet Commonly known as: KLOR-CON TAKE 1 TABLET BY MOUTH DAILY AT BEDTIME  torsemide 20 MG tablet Commonly known as: DEMADEX Take 1 tablet (20 mg total) by mouth daily. Start taking on: May 30, 2021   traMADol 50 MG tablet Commonly known as: ULTRAM TAKE 1 TABLET BY MOUTH EVERY 6 HOURS AS NEEDED FOR MODERATE PAIN       ASK your doctor about these medications    sucralfate 1 g tablet Commonly known as: Carafate Take 1 tablet (1 g total) by mouth 4 (four) times daily -  with meals and at bedtime.               Durable Medical Equipment  (From  admission, onward)           Start     Ordered   05/29/21 0948  DME Oxygen  Once       Comments: Can use up to 3.5L with ambulation. Otherwise, use 2L at rest.  Question Answer Comment  Length of Need Lifetime   Liters per Minute 3   Frequency Continuous (stationary and portable oxygen unit needed)   Oxygen conserving device Yes   Oxygen delivery system Gas      05/29/21 0949           Allergies  Allergen Reactions   Other Other (See Comments)    Patient has hemorrhaged at least 4 times in her lifetime   Cefuroxime Axetil Nausea Only    Upset stomach    Ciprofloxacin Nausea Only    Upset stomach    Follow-up [facility] Follow up.   Specialty: Cardiology Why: We have scheduled a follow-up for you on Friday June 17, 2021 at 9:30 AM (Arrive by 9:15 AM). This is with Laurann Montana, one of the nurse practitioners that works with Dr. Debara Pickett. IMPORTANT: Please note that this appointment is at our [facility] [facility] [address]. Contact information: [address] [place] [address] [place] Follow up.   Specialty: [facility] Why: the office will call to schedule home health visits Contact information: [place] [address] [phone]         [facility]. Schedule an appointment as soon as possible for a visit in 1 week(s).   Specialty: Pulmonology Why: For respiratory follow up Contact information: [address] [place] [address] [phone]                 The results of significant diagnostics from this hospitalization (including imaging, microbiology, ancillary and laboratory) are listed below for reference.    Significant Diagnostic Studies: US RENAL  Result Date: 05/26/2021 CLINICAL DATA:  Acute renal insufficiency.  Prior  left nephrectomy. EXAM: RENAL / URINARY TRACT ULTRASOUND COMPLETE COMPARISON:  CT 07/02/2019. FINDINGS: Right Kidney: Renal measurements: 14.6 x 5.5 x 5.4 cm = volume: 227.8 mL. Cortical thinning. Echogenicity within normal limits. No mass or hydronephrosis visualized. Left Kidney: Renal measurements: Prior nephrectomy. Bladder: Appears normal for degree of bladder distention. Right ureteral jet not identified. Other: None. IMPRESSION: 1. Right renal cortical thinning. No hydronephrosis or bladder distention. Right ureteral jet not identified. 2.  Prior left nephrectomy. Electronically Signed   By: Marcello Moores  Register   On: 05/26/2021 13:38   DG Chest Port 1 View  Result Date: 05/25/2021 CLINICAL DATA:  Shortness of breath EXAM: PORTABLE CHEST 1 VIEW COMPARISON:  07/10/2019, CT 12/01/2017 FINDINGS: Cardiomegaly with vascular congestion  and mild interstitial pulmonary edema. No sizable effusion. No focal consolidation. Atelectasis or scarring in the left mid lung. Aortic atherosclerosis. No pneumothorax. IMPRESSION: Cardiomegaly with vascular congestion and mild interstitial edema Electronically Signed   By: Donavan Foil M.D.   On: 05/25/2021 15:41   ECHOCARDIOGRAM COMPLETE  Result Date: 05/26/2021    ECHOCARDIOGRAM REPORT   Patient Name:   Margaret Dyer Date of Exam: 05/26/2021 Medical Rec #:  [id number]     Height:       63.0 in Accession #:    [id number]    Weight:       204.0 lb Date of Birth:  09-23-1935     BSA:          1.950 m Patient Age:    86 years      BP:           139/59 mmHg Patient Gender: F             HR:           65 bpm. Exam Location:  Inpatient Procedure: 2D Echo, Color Doppler and Cardiac Doppler Indications:    CHF-Acute Diastolic N47.09  History:        Patient has prior history of Echocardiogram examinations, most                 recent 05/13/2017. CHF, Pulmonary HTN; Risk Factors:Diabetes,                 Dyslipidemia and Hypertension.  Sonographer:    Bernadene Person RDCS Referring Phys:  [place]  1. Left ventricular ejection fraction, by estimation, is 60 to 65%. The left ventricle has normal function. The left ventricle has no regional wall motion abnormalities. Left ventricular diastolic parameters are consistent with Grade II diastolic dysfunction (pseudonormalization).  2. Right ventricular systolic function is normal. The right ventricular size is normal. Tricuspid regurgitation signal is inadequate for assessing PA pressure.  3. The mitral valve is normal in structure. Trivial mitral valve regurgitation. No evidence of mitral stenosis.  4. The aortic valve is tricuspid. Aortic valve regurgitation is not visualized. No aortic stenosis is present.  5. The inferior vena cava is normal in size with greater than 50% respiratory variability, suggesting right atrial pressure of 3 mmHg. FINDINGS  Left Ventricle: Left ventricular ejection fraction, by estimation, is 60 to 65%. The left ventricle has normal function. The left ventricle has no regional wall motion abnormalities. The left ventricular internal cavity size was normal in size. There is  no left ventricular hypertrophy. Left ventricular diastolic parameters are consistent with Grade II diastolic dysfunction (pseudonormalization). Right Ventricle: The right ventricular size is normal. Right ventricular systolic function is normal. Tricuspid regurgitation signal is inadequate for assessing PA pressure. The tricuspid regurgitant velocity is 2.29 m/s, and with an assumed right atrial  pressure of 8 mmHg, the estimated right ventricular systolic pressure is 62.8 mmHg. Left Atrium: Left atrial size was normal in size. Right Atrium: Right atrial size was normal in size. Pericardium: Trivial pericardial effusion is present. Mitral Valve: The mitral valve is normal in structure. Trivial mitral valve regurgitation. No evidence of mitral valve stenosis. Tricuspid Valve: The tricuspid valve is normal in structure. Tricuspid  valve regurgitation is trivial. No evidence of tricuspid stenosis. Aortic Valve: The aortic valve is tricuspid. Aortic valve regurgitation is not visualized. No aortic stenosis is present. Pulmonic Valve: The pulmonic valve was normal in structure. Pulmonic valve regurgitation is not visualized.  No evidence of pulmonic stenosis. Aorta: The aortic root is normal in size and structure. Venous: The inferior vena cava is normal in size with greater than 50% respiratory variability, suggesting right atrial pressure of 3 mmHg. IAS/Shunts: The interatrial septum was not well visualized.  LEFT VENTRICLE PLAX 2D LVIDd:         5.10 cm  Diastology LVIDs:         3.20 cm  LV e' medial:    7.46 cm/s LV PW:         1.10 cm  LV E/e' medial:  15.7 LV IVS:        1.00 cm  LV e' lateral:   10.10 cm/s LVOT diam:     1.90 cm  LV E/e' lateral: 11.6 LV SV:         79 LV SV Index:   40 LVOT Area:     2.84 cm  RIGHT VENTRICLE RV S prime:     17.70 cm/s TAPSE (M-mode): 2.1 cm LEFT ATRIUM             Index       RIGHT ATRIUM           Index LA diam:        3.90 cm 2.00 cm/m  RA Area:     16.10 cm LA Vol (A2C):   49.9 ml 25.59 ml/m RA Volume:   38.20 ml  19.59 ml/m LA Vol (A4C):   54.7 ml 28.05 ml/m LA Biplane Vol: 52.7 ml 27.03 ml/m  AORTIC VALVE LVOT Vmax:   114.00 cm/s LVOT Vmean:  76.800 cm/s LVOT VTI:    0.277 m  AORTA Ao Root diam: 3.40 cm Ao Asc diam:  3.30 cm MITRAL VALVE                TRICUSPID VALVE MV Area (PHT): 2.99 cm     TR Peak grad:   21.0 mmHg MV Decel Time: 254 msec     TR Vmax:        229.00 cm/s MV E velocity: 117.00 cm/s MV A velocity: 70.40 cm/s   SHUNTS MV E/A ratio:  1.66         Systemic VTI:  0.28 m                             Systemic Diam: 1.90 cm Kirk Ruths MD Electronically signed by Kirk Ruths MD Signature Date/Time: 05/26/2021/12:58:06 PM    Final     Microbiology: Recent Results (from the past 240 hour(s))  SARS CORONAVIRUS 2 (TAT 6-24 HRS) Nasopharyngeal Nasopharyngeal Swab     Status:  None   Collection Time: 05/25/21  6:18 PM   Specimen: Nasopharyngeal Swab  Result Value Ref Range Status   SARS Coronavirus 2 NEGATIVE NEGATIVE Final    Comment: (NOTE) SARS-CoV-2 target nucleic acids are NOT DETECTED.  The SARS-CoV-2 RNA is generally detectable in upper and lower respiratory specimens during the acute phase of infection. Negative results do not preclude SARS-CoV-2 infection, do not rule out co-infections with other pathogens, and should not be used as the sole basis for treatment or other patient management decisions. Negative results must be combined with clinical observations, patient history, and epidemiological information. The expected result is Negative.  Fact Sheet for Patients: SugarRoll.be  Fact Sheet for Healthcare Providers: https://www.woods-mathews.com/  This test is not yet approved or cleared by the [place] FDA and  has been  authorized for detection and/or diagnosis of SARS-CoV-2 by FDA under an Emergency Use Authorization (EUA). This EUA will remain  in effect (meaning this test can be used) for the duration of the COVID-19 declaration under Se ction 564(b)(1) of the Act, 21 U.S.C. section 360bbb-3(b)(1), unless the authorization is terminated or revoked sooner.  Performed at [facility], [place] [address], [place], [place] [address]      Labs: Basic Metabolic Panel: Recent Labs  Lab 05/25/21 1523 05/26/21 0445 05/27/21 0322 05/28/21 0313 05/29/21 0352  NA 134* 136 136 139 142  K 5.1 4.8 4.6 4.7 4.3  CL 100 103 96* 102 102  CO2 _0 33*  GLUCOSE 154* 148* 221* 139* 103*  BUN 57* 62* 69* 64* 53*  CREATININE 2.54* 2.35* 2.31* 1.78* 1.54*  CALCIUM 8.7* 9.0 9.2 9.1 9.1  MG  --  2.3  --   --   --   PHOS  --  5.0* 4.6 3.6 3.8   Liver Function Tests: Recent Labs  Lab 05/25/21 1523 05/26/21 0445 05/27/21 0322 05/28/21 0313 05/29/21 0352  AST 17 18  --   --   --   ALT 17  19  --   --   --   ALKPHOS 48 50  --   --   --   BILITOT 0.8 0.7  --   --   --   PROT 6.1* 6.4*  --   --   --   ALBUMIN 3.3* 3.4* 3.2* 3.3* 3.2*   No results for input(s): LIPASE, AMYLASE in the last 168 hours. No results for input(s): AMMONIA in the last 168 hours. CBC: Recent Labs  Lab 05/25/21 1523 05/26/21 0445 05/27/21 0322  WBC 6.1 7.2 5.6  NEUTROABS 4.6 5.3  --   HGB 10.1* 10.5* 10.1*  HCT 31.5* 32.4* 30.6*  MCV 94.3 95.9 93.3  PLT 92* 102* 101*   Cardiac Enzymes: No results for input(s): CKTOTAL, CKMB, CKMBINDEX, TROPONINI in the last 168 hours. BNP: BNP (last 3 results) Recent Labs    05/25/21 1523  BNP 297.7*    ProBNP (last 3 results) No results for input(s): PROBNP in the last 8760 hours.  CBG: Recent Labs  Lab 05/28/21 1216 05/28/21 1656 05/28/21 2357 05/29/21 0340 05/29/21 0851  GLUCAP 214* 305* 271* 101* 274*       Signed:  [facility] 05/29/2021, 9:58 AM

## 2021-05-30 ENCOUNTER — Telehealth: Payer: Self-pay | Admitting: Family Medicine

## 2021-05-30 ENCOUNTER — Other Ambulatory Visit: Payer: Self-pay

## 2021-05-30 DIAGNOSIS — J9611 Chronic respiratory failure with hypoxia: Secondary | ICD-10-CM

## 2021-05-30 DIAGNOSIS — R0602 Shortness of breath: Secondary | ICD-10-CM

## 2021-05-30 DIAGNOSIS — N1831 Chronic kidney disease, stage 3a: Secondary | ICD-10-CM

## 2021-05-30 DIAGNOSIS — I5032 Chronic diastolic (congestive) heart failure: Secondary | ICD-10-CM

## 2021-05-30 DIAGNOSIS — I10 Essential (primary) hypertension: Secondary | ICD-10-CM

## 2021-05-30 DIAGNOSIS — J189 Pneumonia, unspecified organism: Secondary | ICD-10-CM

## 2021-05-30 LAB — GLUCOSE, CAPILLARY: Glucose-Capillary: 307 mg/dL — ABNORMAL HIGH (ref 70–99)

## 2021-05-30 NOTE — Telephone Encounter (Signed)
Patient calling and stating that she just left the ED and is now on oxygen fully 100%  Patients daughter is needing to talk to someone about how to get a tank for home etc  Daughters name teresea [phone]

## 2021-05-30 NOTE — Telephone Encounter (Signed)
Referral placed

## 2021-05-30 NOTE — Addendum Note (Signed)
Addended by: Sanda Linger on: 05/30/2021 01:09 PM   Modules accepted: Orders

## 2021-05-30 NOTE — Telephone Encounter (Signed)
Tried calling daughter [name]- no answer and then call went into a couple of beeps and stopped.  (Called twice)

## 2021-05-31 ENCOUNTER — Telehealth: Payer: Self-pay

## 2021-05-31 ENCOUNTER — Other Ambulatory Visit: Payer: Self-pay | Admitting: Family Medicine

## 2021-05-31 DIAGNOSIS — I2723 Pulmonary hypertension due to lung diseases and hypoxia: Secondary | ICD-10-CM

## 2021-05-31 NOTE — Telephone Encounter (Signed)
Transition Care Management Follow-up Telephone Call Date of discharge and from where: 05/29/21-[facility] How have you been since you were released from the hospital? Doing ok. Any questions or concerns? Yes patient states her oxygen tubing will not reach everywhere she wants to go in the house & she states she will need another portable tank to bring to her doctor's appt's. She states she has called the Oxygen supplier & is waiting for them to call back to set up a time to come out & assess the situation.   Items Reviewed: Did the pt receive and understand the discharge instructions provided? No  Medications obtained and verified? Yes  Other? Yes  Any new allergies since your discharge? No  Dietary orders reviewed? Yes Do you have support at home? Yes   Home Care and Equipment/Supplies: Were home health services ordered? yes If so, what is the name of the agency? [facility]  Has the agency set up a time to come to the patient's home? no Were any new equipment or medical supplies ordered?  No What is the name of the medical supply agency? N/a Were you able to get the supplies/equipment? not applicable   Functional Questionnaire: (I = Independent and D = Dependent) ADLs: I with assistance  Bathing/Dressing- I  Meal Prep- D  Eating- I  Maintaining continence- I  Transferring/Ambulation- I with walker  Managing Meds- I  Follow up appointments reviewed:  PCP Hospital f/u appt confirmed? Yes  Scheduled to see Dr. Etter Sjogren on 06/09/2021 @ 2:00. [facility] f/u appt confirmed? Yes  Scheduled to see Terie Purser on 06/17/21 @ 9:30. Are transportation arrangements needed? No  If their condition worsens, is the pt aware to call PCP or go to the Emergency Dept.? Yes Was the patient provided with contact information for the PCP's office or ED? Yes Was to pt encouraged to call back with questions or concerns? Yes

## 2021-05-31 NOTE — Telephone Encounter (Signed)
Patient called and stated no-one spoke with daughter yesterday , told her message was left for daughter.   Daughter's number : [phone]

## 2021-06-01 ENCOUNTER — Telehealth: Payer: Self-pay | Admitting: *Deleted

## 2021-06-01 NOTE — Telephone Encounter (Signed)
Spoke with pt's daughter. They were wondering how patient could receive a portable G5 O2 concentrator from [employer]. Family is concerned that the patient will fall with her current O2 tank and cord due to the patient having to use a walker and maneuver the walker and the cord. I advised her that Pulmonology may have to do this but I would ask. Please advise

## 2021-06-01 NOTE — Chronic Care Management (AMB) (Signed)
  Chronic Care Management   Note  06/01/2021 Name: [name] MRN: [id number] DOB: Oct 17, 1935  Margaret Dyer is a 86 y.o. year old female who is a primary care patient of Ann Held, DO. I reached out to [name] by phone today in response to a referral sent by Ms. Glynis Smiles Niccoli's PCP Ann Held, DO     [name] was given information about Chronic Care Management services today including:  CCM service includes personalized support from designated clinical staff supervised by her physician, including individualized plan of care and coordination with other care providers 24/7 contact phone numbers for assistance for urgent and routine care needs. Service will only be billed when office clinical staff spend 20 minutes or more in a month to coordinate care. Only one practitioner may furnish and bill the service in a calendar month. The patient may stop CCM services at any time (effective at the end of the month) by phone call to the office staff. The patient will be responsible for cost sharing (co-pay) of up to 20% of the service fee (after annual deductible is met).  Patient agreed to services and verbal consent obtained.   Follow up plan: Telephone appointment with care management team member scheduled for: 06/10/2021 - pt has appt with pcp on 06/09/2021  Julian Hy, [facility]  Direct Dial: [phone]

## 2021-06-01 NOTE — Telephone Encounter (Signed)
Patient's daughter called back , stated she missed a call , made her aware clinical staff was at lunch and Lowne's assistant will call back later on today .    Call back: [phone]

## 2021-06-01 NOTE — Telephone Encounter (Signed)
We need to fill out 02 forms or will pulmonary get pt set up?

## 2021-06-01 NOTE — Telephone Encounter (Signed)
Attempted to call patient's daughter. Was in the middle of leaving a message and it sounded like someone picked up the phone. I could hear the TV in the background. I asked several times if anyone was there with no response. I attempted to call back and received busy signal.

## 2021-06-02 DIAGNOSIS — K7460 Unspecified cirrhosis of liver: Secondary | ICD-10-CM | POA: Diagnosis not present

## 2021-06-02 DIAGNOSIS — N1832 Chronic kidney disease, stage 3b: Secondary | ICD-10-CM | POA: Diagnosis not present

## 2021-06-02 DIAGNOSIS — M19012 Primary osteoarthritis, left shoulder: Secondary | ICD-10-CM | POA: Diagnosis not present

## 2021-06-02 DIAGNOSIS — M170 Bilateral primary osteoarthritis of knee: Secondary | ICD-10-CM | POA: Diagnosis not present

## 2021-06-02 DIAGNOSIS — E049 Nontoxic goiter, unspecified: Secondary | ICD-10-CM | POA: Diagnosis not present

## 2021-06-02 DIAGNOSIS — J9621 Acute and chronic respiratory failure with hypoxia: Secondary | ICD-10-CM | POA: Diagnosis not present

## 2021-06-02 DIAGNOSIS — I50810 Right heart failure, unspecified: Secondary | ICD-10-CM | POA: Diagnosis not present

## 2021-06-02 DIAGNOSIS — N3281 Overactive bladder: Secondary | ICD-10-CM | POA: Diagnosis not present

## 2021-06-02 DIAGNOSIS — I2510 Atherosclerotic heart disease of native coronary artery without angina pectoris: Secondary | ICD-10-CM | POA: Diagnosis not present

## 2021-06-02 DIAGNOSIS — M19041 Primary osteoarthritis, right hand: Secondary | ICD-10-CM | POA: Diagnosis not present

## 2021-06-02 DIAGNOSIS — I5033 Acute on chronic diastolic (congestive) heart failure: Secondary | ICD-10-CM | POA: Diagnosis not present

## 2021-06-02 DIAGNOSIS — D696 Thrombocytopenia, unspecified: Secondary | ICD-10-CM | POA: Diagnosis not present

## 2021-06-02 DIAGNOSIS — M19042 Primary osteoarthritis, left hand: Secondary | ICD-10-CM | POA: Diagnosis not present

## 2021-06-02 DIAGNOSIS — I313 Pericardial effusion (noninflammatory): Secondary | ICD-10-CM | POA: Diagnosis not present

## 2021-06-02 DIAGNOSIS — E1122 Type 2 diabetes mellitus with diabetic chronic kidney disease: Secondary | ICD-10-CM | POA: Diagnosis not present

## 2021-06-02 DIAGNOSIS — M19011 Primary osteoarthritis, right shoulder: Secondary | ICD-10-CM | POA: Diagnosis not present

## 2021-06-02 DIAGNOSIS — E039 Hypothyroidism, unspecified: Secondary | ICD-10-CM | POA: Diagnosis not present

## 2021-06-02 DIAGNOSIS — E785 Hyperlipidemia, unspecified: Secondary | ICD-10-CM | POA: Diagnosis not present

## 2021-06-02 DIAGNOSIS — J449 Chronic obstructive pulmonary disease, unspecified: Secondary | ICD-10-CM | POA: Diagnosis not present

## 2021-06-02 DIAGNOSIS — M8580 Other specified disorders of bone density and structure, unspecified site: Secondary | ICD-10-CM | POA: Diagnosis not present

## 2021-06-02 DIAGNOSIS — I081 Rheumatic disorders of both mitral and tricuspid valves: Secondary | ICD-10-CM | POA: Diagnosis not present

## 2021-06-02 DIAGNOSIS — K7581 Nonalcoholic steatohepatitis (NASH): Secondary | ICD-10-CM | POA: Diagnosis not present

## 2021-06-02 DIAGNOSIS — I2723 Pulmonary hypertension due to lung diseases and hypoxia: Secondary | ICD-10-CM | POA: Diagnosis not present

## 2021-06-02 DIAGNOSIS — I130 Hypertensive heart and chronic kidney disease with heart failure and stage 1 through stage 4 chronic kidney disease, or unspecified chronic kidney disease: Secondary | ICD-10-CM | POA: Diagnosis not present

## 2021-06-02 DIAGNOSIS — N179 Acute kidney failure, unspecified: Secondary | ICD-10-CM | POA: Diagnosis not present

## 2021-06-03 DIAGNOSIS — N1832 Chronic kidney disease, stage 3b: Secondary | ICD-10-CM | POA: Diagnosis not present

## 2021-06-03 DIAGNOSIS — I5033 Acute on chronic diastolic (congestive) heart failure: Secondary | ICD-10-CM | POA: Diagnosis not present

## 2021-06-03 DIAGNOSIS — I130 Hypertensive heart and chronic kidney disease with heart failure and stage 1 through stage 4 chronic kidney disease, or unspecified chronic kidney disease: Secondary | ICD-10-CM | POA: Diagnosis not present

## 2021-06-03 DIAGNOSIS — I081 Rheumatic disorders of both mitral and tricuspid valves: Secondary | ICD-10-CM | POA: Diagnosis not present

## 2021-06-03 DIAGNOSIS — E1122 Type 2 diabetes mellitus with diabetic chronic kidney disease: Secondary | ICD-10-CM | POA: Diagnosis not present

## 2021-06-03 DIAGNOSIS — I50810 Right heart failure, unspecified: Secondary | ICD-10-CM | POA: Diagnosis not present

## 2021-06-03 NOTE — Addendum Note (Signed)
Addended by: Kem Boroughs D on: 06/03/2021 03:26 PM   Modules accepted: Orders

## 2021-06-03 NOTE — Telephone Encounter (Signed)
Spoke with daughter and she stated that pt also needs it for going to doctor appointment and the house a big floor plan and the cord cannot go all the way to where she needs to go.    Order placed for concentrator

## 2021-06-07 DIAGNOSIS — I130 Hypertensive heart and chronic kidney disease with heart failure and stage 1 through stage 4 chronic kidney disease, or unspecified chronic kidney disease: Secondary | ICD-10-CM | POA: Diagnosis not present

## 2021-06-07 DIAGNOSIS — I5033 Acute on chronic diastolic (congestive) heart failure: Secondary | ICD-10-CM | POA: Diagnosis not present

## 2021-06-07 DIAGNOSIS — I50810 Right heart failure, unspecified: Secondary | ICD-10-CM | POA: Diagnosis not present

## 2021-06-07 DIAGNOSIS — N1832 Chronic kidney disease, stage 3b: Secondary | ICD-10-CM | POA: Diagnosis not present

## 2021-06-07 DIAGNOSIS — E1122 Type 2 diabetes mellitus with diabetic chronic kidney disease: Secondary | ICD-10-CM | POA: Diagnosis not present

## 2021-06-07 DIAGNOSIS — I081 Rheumatic disorders of both mitral and tricuspid valves: Secondary | ICD-10-CM | POA: Diagnosis not present

## 2021-06-08 ENCOUNTER — Telehealth: Payer: Self-pay

## 2021-06-08 DIAGNOSIS — E1122 Type 2 diabetes mellitus with diabetic chronic kidney disease: Secondary | ICD-10-CM | POA: Diagnosis not present

## 2021-06-08 DIAGNOSIS — I50810 Right heart failure, unspecified: Secondary | ICD-10-CM | POA: Diagnosis not present

## 2021-06-08 DIAGNOSIS — I081 Rheumatic disorders of both mitral and tricuspid valves: Secondary | ICD-10-CM | POA: Diagnosis not present

## 2021-06-08 DIAGNOSIS — N1832 Chronic kidney disease, stage 3b: Secondary | ICD-10-CM | POA: Diagnosis not present

## 2021-06-08 DIAGNOSIS — I130 Hypertensive heart and chronic kidney disease with heart failure and stage 1 through stage 4 chronic kidney disease, or unspecified chronic kidney disease: Secondary | ICD-10-CM | POA: Diagnosis not present

## 2021-06-08 DIAGNOSIS — I5033 Acute on chronic diastolic (congestive) heart failure: Secondary | ICD-10-CM | POA: Diagnosis not present

## 2021-06-08 NOTE — Telephone Encounter (Signed)
Caller states she needs to know what time her appointment is on Thursday. She needs to be called back today, so she can let her daughter know.  Telephone: [phone]

## 2021-06-08 NOTE — Telephone Encounter (Signed)
Pt called. Left VM with appt details

## 2021-06-09 ENCOUNTER — Telehealth: Payer: Self-pay | Admitting: *Deleted

## 2021-06-09 ENCOUNTER — Encounter: Payer: Self-pay | Admitting: Family Medicine

## 2021-06-09 ENCOUNTER — Ambulatory Visit (INDEPENDENT_AMBULATORY_CARE_PROVIDER_SITE_OTHER): Payer: Medicare Other | Admitting: Family Medicine

## 2021-06-09 ENCOUNTER — Other Ambulatory Visit: Payer: Self-pay

## 2021-06-09 VITALS — BP 130/88 | HR 66 | Temp 97.8°F | Resp 20 | Ht 63.5 in | Wt 205.0 lb

## 2021-06-09 DIAGNOSIS — I739 Peripheral vascular disease, unspecified: Secondary | ICD-10-CM

## 2021-06-09 DIAGNOSIS — Z9981 Dependence on supplemental oxygen: Secondary | ICD-10-CM | POA: Diagnosis not present

## 2021-06-09 DIAGNOSIS — N184 Chronic kidney disease, stage 4 (severe): Secondary | ICD-10-CM | POA: Diagnosis not present

## 2021-06-09 DIAGNOSIS — Z794 Long term (current) use of insulin: Secondary | ICD-10-CM

## 2021-06-09 DIAGNOSIS — M79605 Pain in left leg: Secondary | ICD-10-CM | POA: Diagnosis not present

## 2021-06-09 DIAGNOSIS — E1165 Type 2 diabetes mellitus with hyperglycemia: Secondary | ICD-10-CM

## 2021-06-09 DIAGNOSIS — E785 Hyperlipidemia, unspecified: Secondary | ICD-10-CM

## 2021-06-09 DIAGNOSIS — I10 Essential (primary) hypertension: Secondary | ICD-10-CM

## 2021-06-09 DIAGNOSIS — I509 Heart failure, unspecified: Secondary | ICD-10-CM | POA: Diagnosis not present

## 2021-06-09 DIAGNOSIS — I272 Pulmonary hypertension, unspecified: Secondary | ICD-10-CM

## 2021-06-09 DIAGNOSIS — G894 Chronic pain syndrome: Secondary | ICD-10-CM | POA: Diagnosis not present

## 2021-06-09 DIAGNOSIS — E119 Type 2 diabetes mellitus without complications: Secondary | ICD-10-CM | POA: Diagnosis not present

## 2021-06-09 DIAGNOSIS — C649 Malignant neoplasm of unspecified kidney, except renal pelvis: Secondary | ICD-10-CM | POA: Diagnosis not present

## 2021-06-09 MED ORDER — ALBUTEROL SULFATE (2.5 MG/3ML) 0.083% IN NEBU: 2.5000 mg | INHALATION_SOLUTION | Freq: Four times a day (QID) | RESPIRATORY_TRACT | 12 refills | Status: DC | PRN

## 2021-06-09 MED ORDER — SIMVASTATIN 40 MG PO TABS
40.0000 mg | ORAL_TABLET | Freq: Every day | ORAL | 3 refills | Status: DC
Start: 2021-06-09 — End: 2021-08-29

## 2021-06-09 MED ORDER — TRAMADOL HCL 50 MG PO TABS: 50.0000 mg | ORAL_TABLET | Freq: Four times a day (QID) | ORAL | 1 refills | Status: DC | PRN

## 2021-06-09 MED ORDER — EZETIMIBE 10 MG PO TABS: 10.0000 mg | ORAL_TABLET | Freq: Every day | ORAL | 3 refills | Status: DC

## 2021-06-09 NOTE — Telephone Encounter (Signed)
Order faxed to [facility] at [phone] for [employer]ogen and nebulizer.  Left message on machine to let patient know that order was sent.

## 2021-06-09 NOTE — Assessment & Plan Note (Signed)
Refer to nephrology Check labs

## 2021-06-09 NOTE — Patient Instructions (Signed)
Home Oxygen Use, Adult When a medical condition keeps you from getting enough oxygen, your health care provider may instruct you to take extra oxygen at home. Your health care provider will let you know: When to take oxygen. How long to take oxygen. How quickly oxygen should be delivered (flow rate), in liters per minute (LPM or L/M). Home oxygen can be given through: A mask. A nasal cannula. This is a device or tube that goes in the nostrils. A transtracheal catheter. This is a small, thin tube placed in the windpipe (trachea). A breathing tube (tracheostomy tube) that is surgically placed in the windpipe. This may be used in severe cases. These devices are connected with tubing to an oxygen source, such as: A tank. Tanks hold oxygen in gas form. They must be replaced when the oxygen is used up. A liquid oxygen device. This holds oxygen in liquid form. Liquid oxygen is very cold. It must be replaced when the oxygen is used up. An oxygen concentrator machine. This filters oxygen in the room. There are two types of oxygen concentrator machines--stationary and portable. A stationary oxygen concentrator machine plugs into the main electricity supply at your home. You must have a backup cylinder of oxygen in case the power goes out. A portable oxygen concentrator machine is smaller in size and more lightweight. This machine uses battery supply and can be used outside the home. Work with your health care provider to find equipment that works best for [employer]. What are the risks? Delivery of supplemental oxygen is generally safe. However, some risks include: Fire. This can happen if the oxygen is exposed to a heat source, flame, or spark. Injury to skin. This can happen if liquid oxygen touches your skin. Damage to the lungs or other organs. This can happen from getting too little or too much oxygen. Supplies needed: To use oxygen, you will need: A mask, nasal cannula, transtracheal  catheter, or tracheostomy. An oxygen tank, a liquid oxygen device, or an oxygen concentrator. The tape that your health care provider recommends (optional). Your health care provider may also recommend: A humidifier to warm and moisten the oxygen delivered. This will depend on how much oxygen you need and the type of home oxygen device you use. A pulse oximeter. This device measures the percentage of oxygen in your blood. How to use oxygen Your health care provider or a person from your [place] will show you how to use your oxygen device. Follow his or her instructions. The instructions may look something like this: Wash your hands with soap and water. If you use an oxygen concentrator, make sure it is plugged in. Place one end of the tube into the port on the tank, device, or machine. Place the mask over your nose and mouth. Or, place the nasal cannula and secure it with tape if instructed. If you use a tracheostomy or transtracheal catheter, connect it to the oxygen source as directed. Make sure the liter-flow setting on the machine is at the level prescribed by your health care provider. Turn on the machine or adjust the knob on the tank or device to the correct liter-flow setting. When you are done, turn off and unplug the machine, or turn the knob to OFF. How to clean and care for the oxygen supplies Nasal cannula Clean it with a warm, wet cloth daily or as needed. Wash it with a liquid soap once a week. Rinse it thoroughly once or twice a week.  Air-dry it. Replace it every 2-4 weeks. If you have an infection, such as a cold or pneumonia, change the cannula when you get better. Mask Replace it every 2-4 weeks. If you have an infection, such as a cold or pneumonia, change the mask when you get better. Humidifier bottle Wash the bottle between each refill: Wash it with soap and warm water. Rinse it thoroughly. Clean it and its top with a disinfectant cleaner. Air-dry  it. Make sure it is dry before you refill it. Oxygen concentrator Clean the air filter at least twice a week according to directions from your home medical equipment and service company. Wipe down the cabinet every day. To do this: Unplug the unit. Wipe down the cabinet with a damp cloth. Dry the cabinet. Other equipment Change any extra tubing every 1-3 months. Follow instructions from your health care provider about taking care of any other equipment. Safety tips Fire safety tips  Keep your oxygen and oxygen supplies at least 6 ft (2 m) away from sources of heat, flames, and sparks at all times. Do not allow smoking near your oxygen. Put up "no smoking" signs in your home. Avoid smoking areas when in public. Do not use materials that can burn (are flammable) while you use oxygen. This includes: Petroleum jelly. Hair spray or other aerosol sprays. Rubbing alcohol. Hand sanitizer. When you go to a restaurant with portable oxygen, ask to be seated in the non-smoking section. Keep a Data processing manager close by. Let your fire department know that you have oxygen in your home. Test your home smoke detectors regularly.  Traveling Secure your oxygen tank in the vehicle so that it does not move around. Follow instructions from your medical device company about how to safely secure your tank. Make sure you have enough oxygen for the amount of time you will be away from home. If you are planning to travel by public transportation (airplane, train, bus, or boat), contact the company to find out if it allows the use of an approved portable oxygen concentrator. You may also need documents from your health care provider and medical device company before you travel. General safety tips If you use an oxygen cylinder, make sure it is in a stand or secured to an object that will not move (fixed object). If you use liquid oxygen, make sure its container is kept upright at all times. If you use an oxygen  concentrator: Tell Loss adjuster, chartered company. Make sure you are given priority service in the event that your power goes out. Avoid using extension cords if possible. Follow these instructions at home: Use oxygen only as told by your health care provider. Do not use alcohol or other drugs that make you relax (sedating drugs) unless instructed. They can slow down your breathing rate and make it hard to get in enough oxygen. Know how and when to order a refill of oxygen. Always keep a spare tank of oxygen. Plan ahead for holidays when you may not be able to get a prescription filled. Use water-based lubricants on your lips or nostrils. Do not use oil-based products like petroleum jelly. To prevent skin irritation on your cheeks or behind your ears, tuck some gauze under the tubing. Where to find more information American Lung Association: DiabeticMale.de Contact a health care provider if: You get headaches often. You have a lasting cough. You are restless or have anxiety. You develop an illness that affects your breathing. You cannot exercise at your regular level. You  have a fever. You have persistent redness under your nose. Get help right away if: You are confused. You are sleepy all the time. You have blue lips or fingernails. You have difficult or irregular breathing that is getting worse. You are struggling to breathe. These symptoms may represent a serious problem that is an emergency. Do not wait to see if the symptoms will go away. Get medical help right away. Call your local emergency services (911 in the [place] Do not drive yourself to the hospital. Summary Your health care provider or a person from your [place] will show you how to use your oxygen device. Follow his or her instructions. If you use an oxygen concentrator, make sure it is plugged in. Make sure the liter-flow setting on the machine is at the level prescribed by your health care provider. Use  oxygen only as told by your health care provider. Keep your oxygen and oxygen supplies at least 6 ft (2 m) away from sources of heat, flames, and sparks at all times. This information is not intended to replace advice given to you by your health care provider. Make sure you discuss any questions you have with your healthcare provider. Document Revised: 01/22/2020 Document Reviewed: 11/18/2019 Elsevier Patient Education  2022 [employer].

## 2021-06-09 NOTE — Telephone Encounter (Signed)
-----  Message from Ann Held, DO sent at 06/09/2021  2:21 PM EDT ----- Pt is asking about the inogen---  did you end up [facility]?

## 2021-06-09 NOTE — Assessment & Plan Note (Signed)
F/u pulmonary thn referral placed to help with transportation issues

## 2021-06-09 NOTE — Assessment & Plan Note (Signed)
Per endo
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
## 2021-06-09 NOTE — Assessment & Plan Note (Signed)
Needs nephrology referral Check labs

## 2021-06-09 NOTE — Assessment & Plan Note (Signed)
Well controlled, no changes to meds. Encouraged heart healthy diet such as the DASH diet and exercise as tolerated.  Pt needs cardiology and nephrology f/u

## 2021-06-10 ENCOUNTER — Telehealth: Payer: Self-pay | Admitting: *Deleted

## 2021-06-10 ENCOUNTER — Telehealth: Payer: Medicare Other

## 2021-06-10 DIAGNOSIS — I130 Hypertensive heart and chronic kidney disease with heart failure and stage 1 through stage 4 chronic kidney disease, or unspecified chronic kidney disease: Secondary | ICD-10-CM | POA: Diagnosis not present

## 2021-06-10 DIAGNOSIS — E1122 Type 2 diabetes mellitus with diabetic chronic kidney disease: Secondary | ICD-10-CM | POA: Diagnosis not present

## 2021-06-10 DIAGNOSIS — I5033 Acute on chronic diastolic (congestive) heart failure: Secondary | ICD-10-CM | POA: Diagnosis not present

## 2021-06-10 DIAGNOSIS — I50810 Right heart failure, unspecified: Secondary | ICD-10-CM | POA: Diagnosis not present

## 2021-06-10 DIAGNOSIS — N1832 Chronic kidney disease, stage 3b: Secondary | ICD-10-CM | POA: Diagnosis not present

## 2021-06-10 DIAGNOSIS — I081 Rheumatic disorders of both mitral and tricuspid valves: Secondary | ICD-10-CM | POA: Diagnosis not present

## 2021-06-10 LAB — COMPREHENSIVE METABOLIC PANEL
ALT: 23 U/L (ref 0–35)
AST: 21 U/L (ref 0–37)
Albumin: 4 g/dL (ref 3.5–5.2)
Alkaline Phosphatase: 49 U/L (ref 39–117)
BUN: 45 mg/dL — ABNORMAL HIGH (ref 6–23)
CO2: 30 mEq/L (ref 19–32)
Calcium: 9.1 mg/dL (ref 8.4–10.5)
Chloride: 100 mEq/L (ref 96–112)
Creatinine, Ser: 1.3 mg/dL — ABNORMAL HIGH (ref 0.40–1.20)
GFR: 37.88 mL/min — ABNORMAL LOW (ref >60.00)
Glucose, Bld: 252 mg/dL — ABNORMAL HIGH (ref 70–99)
Potassium: 4.5 mEq/L (ref 3.5–5.1)
Sodium: 140 mEq/L (ref 135–145)
Total Bilirubin: 0.6 mg/dL (ref 0.2–1.2)
Total Protein: 6.5 g/dL (ref 6.0–8.3)

## 2021-06-10 LAB — LIPID PANEL
Cholesterol: 132 mg/dL (ref 0–200)
HDL: 44.4 mg/dL (ref >39.00)
LDL Cholesterol: 72 mg/dL (ref 0–99)
NonHDL: 87.86
Total CHOL/HDL Ratio: 3
Triglycerides: 81 mg/dL (ref 0.0–149.0)
VLDL: 16.2 mg/dL (ref 0.0–40.0)

## 2021-06-10 LAB — CBC WITH DIFFERENTIAL/PLATELET
Basophils Absolute: 0.1 10*3/uL (ref 0.0–0.1)
Basophils Relative: 1.2 % (ref 0.0–3.0)
Eosinophils Absolute: 0.1 10*3/uL (ref 0.0–0.7)
Eosinophils Relative: 1.4 % (ref 0.0–5.0)
HCT: 32.7 % — ABNORMAL LOW (ref 36.0–46.0)
Hemoglobin: 11.2 g/dL — ABNORMAL LOW (ref 12.0–15.0)
Lymphocytes Relative: 11.6 % — ABNORMAL LOW (ref 12.0–46.0)
Lymphs Abs: 0.8 10*3/uL (ref 0.7–4.0)
MCHC: 34.3 g/dL (ref 30.0–36.0)
MCV: 91 fl (ref 78.0–100.0)
Monocytes Absolute: 0.6 10*3/uL (ref 0.1–1.0)
Monocytes Relative: 9.3 % (ref 3.0–12.0)
Neutro Abs: 5.1 10*3/uL (ref 1.4–7.7)
Neutrophils Relative %: 76.5 % (ref 43.0–77.0)
Platelets: 105 10*3/uL — ABNORMAL LOW (ref 150.0–400.0)
RBC: 3.59 Mil/uL — ABNORMAL LOW (ref 3.87–5.11)
RDW: 13.6 % (ref 11.5–15.5)
WBC: 6.7 10*3/uL (ref 4.0–10.5)

## 2021-06-10 NOTE — Chronic Care Management (AMB) (Signed)
  Chronic Care Management   Note  06/10/2021 Name: Margaret Dyer MRN: [id number] DOB: 1935-04-20  Margaret Dyer is a 86 y.o. year old female who is a primary care patient of Carollee Herter, Alferd Apa, DO. Margaret Dyer is currently enrolled in care management services. An additional referral for Pharmacy and Social Work was placed.   Follow up plan: Unsuccessful telephone outreach attempt made. A HIPAA compliant phone message was left for the patient providing contact information and requesting a return call.  The care management team will reach out to the patient again over the next 5 days.  If patient returns call to provider office, please advise to call [facility] at [phone].   [facility]

## 2021-06-13 ENCOUNTER — Telehealth: Payer: Self-pay

## 2021-06-14 DIAGNOSIS — I081 Rheumatic disorders of both mitral and tricuspid valves: Secondary | ICD-10-CM | POA: Diagnosis not present

## 2021-06-14 DIAGNOSIS — N1832 Chronic kidney disease, stage 3b: Secondary | ICD-10-CM | POA: Diagnosis not present

## 2021-06-14 DIAGNOSIS — E1122 Type 2 diabetes mellitus with diabetic chronic kidney disease: Secondary | ICD-10-CM | POA: Diagnosis not present

## 2021-06-14 DIAGNOSIS — I50810 Right heart failure, unspecified: Secondary | ICD-10-CM | POA: Diagnosis not present

## 2021-06-14 DIAGNOSIS — I5033 Acute on chronic diastolic (congestive) heart failure: Secondary | ICD-10-CM | POA: Diagnosis not present

## 2021-06-14 DIAGNOSIS — I130 Hypertensive heart and chronic kidney disease with heart failure and stage 1 through stage 4 chronic kidney disease, or unspecified chronic kidney disease: Secondary | ICD-10-CM | POA: Diagnosis not present

## 2021-06-16 DIAGNOSIS — N1832 Chronic kidney disease, stage 3b: Secondary | ICD-10-CM | POA: Diagnosis not present

## 2021-06-16 DIAGNOSIS — I50810 Right heart failure, unspecified: Secondary | ICD-10-CM | POA: Diagnosis not present

## 2021-06-16 DIAGNOSIS — E1122 Type 2 diabetes mellitus with diabetic chronic kidney disease: Secondary | ICD-10-CM | POA: Diagnosis not present

## 2021-06-16 DIAGNOSIS — I5033 Acute on chronic diastolic (congestive) heart failure: Secondary | ICD-10-CM | POA: Diagnosis not present

## 2021-06-16 DIAGNOSIS — I130 Hypertensive heart and chronic kidney disease with heart failure and stage 1 through stage 4 chronic kidney disease, or unspecified chronic kidney disease: Secondary | ICD-10-CM | POA: Diagnosis not present

## 2021-06-16 DIAGNOSIS — I081 Rheumatic disorders of both mitral and tricuspid valves: Secondary | ICD-10-CM | POA: Diagnosis not present

## 2021-06-17 ENCOUNTER — Telehealth: Payer: Self-pay

## 2021-06-17 ENCOUNTER — Ambulatory Visit (HOSPITAL_BASED_OUTPATIENT_CLINIC_OR_DEPARTMENT_OTHER): Payer: Medicare Other | Admitting: Family

## 2021-06-17 DIAGNOSIS — I081 Rheumatic disorders of both mitral and tricuspid valves: Secondary | ICD-10-CM | POA: Diagnosis not present

## 2021-06-17 DIAGNOSIS — I50810 Right heart failure, unspecified: Secondary | ICD-10-CM | POA: Diagnosis not present

## 2021-06-17 DIAGNOSIS — E1122 Type 2 diabetes mellitus with diabetic chronic kidney disease: Secondary | ICD-10-CM | POA: Diagnosis not present

## 2021-06-17 DIAGNOSIS — I130 Hypertensive heart and chronic kidney disease with heart failure and stage 1 through stage 4 chronic kidney disease, or unspecified chronic kidney disease: Secondary | ICD-10-CM | POA: Diagnosis not present

## 2021-06-17 DIAGNOSIS — I5033 Acute on chronic diastolic (congestive) heart failure: Secondary | ICD-10-CM | POA: Diagnosis not present

## 2021-06-17 DIAGNOSIS — N1832 Chronic kidney disease, stage 3b: Secondary | ICD-10-CM | POA: Diagnosis not present

## 2021-06-17 NOTE — Telephone Encounter (Signed)
Tried calling [name] and left a VM on her machine to let her know to go to the ER.  Called Daughter [name]- no Answer Called Daughter [name]- left VM

## 2021-06-17 NOTE — Telephone Encounter (Signed)
urse Assessment Nurse: Hassell Done, RN, Melanie Date/Time (Eastern Time): 06/16/2021 2:42:43 PM Confirm and document reason for call. If symptomatic, describe symptoms. ---Caller states she was having SOB and when she was on the tank her O2 was getting low and went down to 90%. She was on her in home concentrator and dropped to 82%. Took her nasal cannula out to clean and dropped to 85 % in a couple of minutes. CHF is diagnosis. Came home from [facility] Sunday. Was 198 and today is 204. Does the patient have any new or worsening symptoms? ---Yes Will a triage be completed? ---Yes Related visit to physician within the last 2 weeks? ---Yes Does the PT have any chronic conditions? (i.e. diabetes, asthma, this includes High risk factors for pregnancy, etc.) ---Yes List chronic conditions. ---CHF diabetic high blood pressure Is this a behavioral health or substance abuse call? ---No Guidelines Guideline Title Affirmed Question Affirmed Notes Nurse Date/Time Eilene Ghazi Time) Breathing Difficulty Illness requiring prolonged bedrest in past month (e.g., Hassell Done, RN, Melanie 06/16/2021 2:46:41 PM PLEASE NOTE: All timestamps contained within this report are represented as [place] Standard Time. CONFIDENTIALTY NOTICE: This fax transmission is intended only for the addressee. It contains information that is legally privileged, confidential or otherwise protected from use or disclosure. If you are not the intended recipient, you are strictly prohibited from reviewing, disclosing, copying using or disseminating any of this information or taking any action in reliance on or regarding this information. If you have received this fax in error, please notify us immediately by telephone so that we can arrange for its return to [place]. Phone: [phone], Toll-Free: [phone], Fax: [phone] Page: 2 of 2 Call Id: [id number] Guidelines Guideline Title Affirmed Question Affirmed Notes Nurse Date/Time  Eilene Ghazi Time) immobilization, long hospital stay) Disp. Time Eilene Ghazi Time) Disposition Final User 06/16/2021 2:40:48 PM Send to Urgent Natale Milch, [place] 06/16/2021 2:55:18 PM Go to ED Now Yes Hassell Done, RN, Donnajean Lopes Disagree/Comply Comply Caller Understands Yes PreDisposition Call Doctor Care Advice Given Per Guideline GO TO ED NOW: * You need to be seen in the Emergency Department. * Another adult should drive. * Patient should not delay going to the emergency department. CALL EMS 911 IF: * Call EMS if you become worse. CARE ADVICE given per Breathing Difficulty (Adult) guideline. Referrals GO TO FACILITY UNDECIDED

## 2021-06-17 NOTE — Telephone Encounter (Signed)
Pt called in was informed that she should go to the ER but she said she didn't go because has been in there too much. Pt states that she will wait until she hears back from her kidney doctor.

## 2021-06-17 NOTE — Telephone Encounter (Signed)
Noted

## 2021-06-17 NOTE — Chronic Care Management (AMB) (Signed)
  Chronic Care Management   Outreach Note  06/17/2021 Name: Margaret Dyer MRN: [id number] DOB: 1935/03/02  Margaret Dyer is a 86 y.o. year old female who is a primary care patient of Ann Held, DO. I reached out to [name] by phone today in response to a referral sent by Ms. Glynis Smiles Greiner's PCP, Carollee Herter, Alferd Apa, DO      A second unsuccessful telephone outreach was attempted today. The patient was referred to the case management team for assistance with care management and care coordination.   Follow Up Plan: A HIPAA compliant phone message was left for the patient providing contact information and requesting a return call. The care management team will reach out to the patient again over the next 7 days.  If patient returns call to provider office, please advise to call [place] at [phone].  [place] Management

## 2021-06-20 NOTE — Telephone Encounter (Signed)
Reviewing chart. Pt did not go to the ED

## 2021-06-21 ENCOUNTER — Telehealth: Payer: Self-pay | Admitting: Internal Medicine

## 2021-06-21 DIAGNOSIS — I5033 Acute on chronic diastolic (congestive) heart failure: Secondary | ICD-10-CM | POA: Diagnosis not present

## 2021-06-21 DIAGNOSIS — I50810 Right heart failure, unspecified: Secondary | ICD-10-CM | POA: Diagnosis not present

## 2021-06-21 DIAGNOSIS — I081 Rheumatic disorders of both mitral and tricuspid valves: Secondary | ICD-10-CM | POA: Diagnosis not present

## 2021-06-21 DIAGNOSIS — N1832 Chronic kidney disease, stage 3b: Secondary | ICD-10-CM | POA: Diagnosis not present

## 2021-06-21 DIAGNOSIS — I130 Hypertensive heart and chronic kidney disease with heart failure and stage 1 through stage 4 chronic kidney disease, or unspecified chronic kidney disease: Secondary | ICD-10-CM | POA: Diagnosis not present

## 2021-06-21 DIAGNOSIS — E1122 Type 2 diabetes mellitus with diabetic chronic kidney disease: Secondary | ICD-10-CM | POA: Diagnosis not present

## 2021-06-21 MED ORDER — TORSEMIDE 20 MG PO TABS: 20.0000 mg | ORAL_TABLET | Freq: Every day | ORAL | 3 refills | Status: DC

## 2021-06-21 NOTE — Telephone Encounter (Signed)
*  STAT* If patient is at the pharmacy, call can be transferred to refill team.   1. Which medications need to be refilled? (please list name of each medication and dose if known) torsemide (DEMADEX) 20 MG tablet  2. Which pharmacy/location (including street and city if local pharmacy) is medication to be sent to? [facility] [place].  3. Do they need a 30 day or 90 day supply? 90 day

## 2021-06-23 ENCOUNTER — Other Ambulatory Visit: Payer: Medicare Other

## 2021-06-23 ENCOUNTER — Ambulatory Visit: Payer: Medicare Other | Admitting: Hematology & Oncology

## 2021-06-23 DIAGNOSIS — I50810 Right heart failure, unspecified: Secondary | ICD-10-CM | POA: Diagnosis not present

## 2021-06-23 DIAGNOSIS — E1122 Type 2 diabetes mellitus with diabetic chronic kidney disease: Secondary | ICD-10-CM | POA: Diagnosis not present

## 2021-06-23 DIAGNOSIS — I5033 Acute on chronic diastolic (congestive) heart failure: Secondary | ICD-10-CM | POA: Diagnosis not present

## 2021-06-23 DIAGNOSIS — I081 Rheumatic disorders of both mitral and tricuspid valves: Secondary | ICD-10-CM | POA: Diagnosis not present

## 2021-06-23 DIAGNOSIS — I130 Hypertensive heart and chronic kidney disease with heart failure and stage 1 through stage 4 chronic kidney disease, or unspecified chronic kidney disease: Secondary | ICD-10-CM | POA: Diagnosis not present

## 2021-06-23 DIAGNOSIS — N1832 Chronic kidney disease, stage 3b: Secondary | ICD-10-CM | POA: Diagnosis not present

## 2021-06-24 ENCOUNTER — Telehealth: Payer: Self-pay

## 2021-06-24 DIAGNOSIS — E1122 Type 2 diabetes mellitus with diabetic chronic kidney disease: Secondary | ICD-10-CM | POA: Diagnosis not present

## 2021-06-24 DIAGNOSIS — N1832 Chronic kidney disease, stage 3b: Secondary | ICD-10-CM | POA: Diagnosis not present

## 2021-06-24 DIAGNOSIS — I081 Rheumatic disorders of both mitral and tricuspid valves: Secondary | ICD-10-CM | POA: Diagnosis not present

## 2021-06-24 DIAGNOSIS — I5033 Acute on chronic diastolic (congestive) heart failure: Secondary | ICD-10-CM | POA: Diagnosis not present

## 2021-06-24 DIAGNOSIS — I130 Hypertensive heart and chronic kidney disease with heart failure and stage 1 through stage 4 chronic kidney disease, or unspecified chronic kidney disease: Secondary | ICD-10-CM | POA: Diagnosis not present

## 2021-06-24 DIAGNOSIS — I50810 Right heart failure, unspecified: Secondary | ICD-10-CM | POA: Diagnosis not present

## 2021-06-24 NOTE — Chronic Care Management (AMB) (Signed)
  Chronic Care Management   Note  06/24/2021 Name: Margaret Dyer MRN: [id number] DOB: 05-27-1935  Margaret Dyer is a 86 y.o. year old female who is a primary care patient of Carollee Herter, Alferd Apa, DO. Margaret Dyer is currently enrolled in care management services. An additional referral for Social Worker and Pharmacy was placed.   Follow up plan: Telephone appointment with care management team member scheduled for: PharmD 07/20/2021 Patient declined to schedule call with Licensed [place] Management  Direct Dial: [phone]

## 2021-06-27 ENCOUNTER — Ambulatory Visit (INDEPENDENT_AMBULATORY_CARE_PROVIDER_SITE_OTHER): Payer: Medicare Other

## 2021-06-27 DIAGNOSIS — E119 Type 2 diabetes mellitus without complications: Secondary | ICD-10-CM

## 2021-06-27 DIAGNOSIS — E1122 Type 2 diabetes mellitus with diabetic chronic kidney disease: Secondary | ICD-10-CM | POA: Diagnosis not present

## 2021-06-27 DIAGNOSIS — N1832 Chronic kidney disease, stage 3b: Secondary | ICD-10-CM | POA: Diagnosis not present

## 2021-06-27 DIAGNOSIS — I081 Rheumatic disorders of both mitral and tricuspid valves: Secondary | ICD-10-CM | POA: Diagnosis not present

## 2021-06-27 DIAGNOSIS — I5033 Acute on chronic diastolic (congestive) heart failure: Secondary | ICD-10-CM | POA: Diagnosis not present

## 2021-06-27 DIAGNOSIS — Z794 Long term (current) use of insulin: Secondary | ICD-10-CM | POA: Diagnosis not present

## 2021-06-27 DIAGNOSIS — I509 Heart failure, unspecified: Secondary | ICD-10-CM | POA: Diagnosis not present

## 2021-06-27 DIAGNOSIS — I130 Hypertensive heart and chronic kidney disease with heart failure and stage 1 through stage 4 chronic kidney disease, or unspecified chronic kidney disease: Secondary | ICD-10-CM | POA: Diagnosis not present

## 2021-06-27 DIAGNOSIS — I10 Essential (primary) hypertension: Secondary | ICD-10-CM | POA: Diagnosis not present

## 2021-06-27 DIAGNOSIS — I50810 Right heart failure, unspecified: Secondary | ICD-10-CM | POA: Diagnosis not present

## 2021-06-27 NOTE — Chronic Care Management (AMB) (Signed)
Chronic Care Management   CCM RN Visit Note  06/27/2021 Name: Margaret Dyer MRN: [id number] DOB: 07-Aug-1935  Subjective: Margaret Dyer is a 86 y.o. year old female who is a primary care patient of Ann Held, DO. The care management team was consulted for assistance with disease management and care coordination needs.    Engaged with patient by telephone for initial visit in response to provider referral for case management and/or care coordination services.   Consent to Services:  The patient was given the following information about Chronic Care Management services today, agreed to services, and gave verbal consent: 1. CCM service includes personalized support from designated clinical staff supervised by the primary care provider, including individualized plan of care and coordination with other care providers 2. 24/7 contact phone numbers for assistance for urgent and routine care needs. 3. Service will only be billed when office clinical staff spend 20 minutes or more in a month to coordinate care. 4. Only one practitioner may furnish and bill the service in a calendar month. 5.The patient may stop CCM services at any time (effective at the end of the month) by phone call to the office staff. 6. The patient will be responsible for cost sharing (co-pay) of up to 20% of the service fee (after annual deductible is met). Patient agreed to services and consent obtained.  Patient agreed to services and verbal consent obtained.   Assessment: Review of patient past medical history, allergies, medications, health status, including review of consultants reports, laboratory and other test data, was performed as part of comprehensive evaluation and provision of chronic care management services.   SDOH (Social Determinants of Health) assessments and interventions performed:  SDOH Interventions    Flowsheet Row Most Recent Value  SDOH Interventions   Food Insecurity Interventions Intervention  Not Indicated  Transportation Interventions Other (Comment)  [referral previously completed for transportation needs]        CCM Care Plan  Allergies  Allergen Reactions   Other Other (See Comments)    Patient has hemorrhaged at least 4 times in her lifetime   Cefuroxime Axetil Nausea Only    Upset stomach    Ciprofloxacin Nausea Only    Upset stomach    Outpatient Encounter Medications as of 06/27/2021  Medication Sig Note   albuterol (PROVENTIL) (2.5 MG/3ML) 0.083% nebulizer solution Take 3 mLs (2.5 mg total) by nebulization every 6 (six) hours as needed for wheezing or shortness of breath.    aspirin EC 81 MG EC tablet Take 1 tablet (81 mg total) by mouth daily.    docusate sodium (COLACE) 100 MG capsule Take 1 capsule (100 mg total) by mouth 2 (two) times daily.    ezetimibe (ZETIA) 10 MG tablet Take 1 tablet (10 mg total) by mouth daily.    hydrALAZINE (APRESOLINE) 25 MG tablet Take 1 tablet (25 mg total) by mouth every 8 (eight) hours.    insulin glargine (LANTUS) 100 UNIT/ML injection Inject 0.25 mLs (25 Units total) into the skin at bedtime.    Insulin Glulisine (APIDRA SOLOSTAR) 100 UNIT/ML Solostar Pen Inject 10-15 Units into the skin 3 (three) times daily before meals.     levothyroxine (SYNTHROID, LEVOTHROID) 25 MCG tablet take 1 tablet by mouth daily 06/27/2021: Takes every day, but Sunday   metoprolol tartrate (LOPRESSOR) 25 MG tablet Take 1 tablet (25 mg total) by mouth 2 (two) times daily.    Multiple Vitamins-Minerals (CENTRUM SILVER ULTRA WOMENS PO) Take 1 tablet by  mouth at bedtime.     oxybutynin (DITROPAN) 5 MG tablet TAKE 1 TABLET BY MOUTH DAILY    polyethylene glycol (MIRALAX / GLYCOLAX) 17 g packet Take 17 g by mouth daily as needed.    potassium chloride SA (KLOR-CON) 20 MEQ tablet TAKE 1 TABLET BY MOUTH DAILY AT BEDTIME    simvastatin (ZOCOR) 40 MG tablet Take 1 tablet (40 mg total) by mouth at bedtime.    sucralfate (CARAFATE) 1 g tablet Take 1 tablet (1 g  total) by mouth 4 (four) times daily -  with meals and at bedtime. (Patient taking differently: Take 1 g by mouth daily as needed.)    torsemide (DEMADEX) 20 MG tablet Take 1 tablet (20 mg total) by mouth daily.    traMADol (ULTRAM) 50 MG tablet Take 1 tablet (50 mg total) by mouth every 6 (six) hours as needed for moderate pain.    No facility-administered encounter medications on file as of 06/27/2021.    Patient Active Problem List   Diagnosis Date Noted   Uncontrolled type 2 diabetes mellitus with hyperglycemia ([place]) 06/09/2021   Chronic renal disease, stage 4, severely decreased glomerular filtration rate (GFR) between 15-29 mL/min/1.73 square meter ([place]) 06/09/2021   CHF exacerbation ([place]) 05/25/2021   Acute on chronic diastolic CHF (congestive heart failure) ([place]) 05/25/2021   Closed fracture of maxillary sinus ([place]) 07/10/2019   UTI (urinary tract infection) 07/03/2019   Food poisoning 07/03/2019   Bacteremia 07/03/2019   Exposure to COVID-19 virus 06/16/2019   Closed left hip fracture ([place]) 12/24/2018   Dysuria 12/17/2017   Dizziness 12/17/2017   Community acquired pneumonia 12/17/2017   Pneumonia 12/01/2017   Urinary incontinence 11/09/2017   Tic 11/09/2017   Pulmonary hypertension ([place]) 09/12/2017   Chronic diastolic CHF (congestive heart failure) ([place]) 06/05/2017   Oral candida 05/29/2017   Vitamin D deficiency 05/29/2017   Chronic respiratory failure ([place]) 05/24/2017   Anxiety 05/24/2017   HCAP (healthcare-associated pneumonia) 05/22/2017   Right heart failure ([place]) 56/38/7564   Acute diastolic CHF (congestive heart failure) ([facility])    Exertional dyspnea 05/13/2017   Hypertension 05/13/2017   Asthma 05/13/2017   DOE (dyspnea on exertion) 05/13/2017   Diabetes mellitus type 2, insulin dependent ([place]) 05/13/2017   Acute kidney injury ([place]) 05/13/2017   History of nephrectomy 05/13/2017   Hyperlipidemia LDL goal <70 05/13/2017   Liver cirrhosis secondary to NASH ([place])  05/13/2017   Thrombocytopenia ([place]) 05/13/2017   Renal cell carcinoma 05/13/2017   Hypothyroidism 05/13/2017   Hypersplenism 05/13/2017   Osteoarthritis 05/13/2017   Overactive bladder 05/13/2017   URI, acute 02/22/2017   Abrasion, left knee, initial encounter 02/22/2017   CTS (carpal tunnel syndrome) 03/04/2016   Bilateral contusion of ribs 10/05/2015   Dental abscess 04/15/2014   Claudication, intermittent ([place]) 12/05/2013   Obesity (BMI 30-39.9) 07/22/2013   Back pain 05/26/2013   Platelets decreased ([place]) 05/26/2013   Breast pain, right 04/21/2013   Anxiety and depression 12/05/2012   Bronchitis 11/06/2012   HEMATURIA UNSPECIFIED 02/17/2011   Hyperlipidemia 09/08/2010   B12 DEFICIENCY 07/22/2010   DIZZINESS 07/04/2010   Essential hypertension 06/21/2010   GOUT, UNSPECIFIED 01/27/2010   NEPHRECTOMY, HX OF 12/02/2009   NECK PAIN, LEFT 09/04/2008   PARESTHESIA 09/02/2007   GOITER NOS 04/23/2007   Diabetes mellitus ([place]) 04/23/2007   Asthma 04/23/2007   Renal stone 04/23/2007   OSTEOPENIA 04/23/2007   CARCINOMA, RENAL CELL 08/10/1999    Conditions to be addressed/monitored:CHF, HTN, DMII, and Pulmonary Disease  Care Plan : Cardiovascular disease  Updates made by Luretha Rued, RN since 06/27/2021 12:00 AM   Problem: lack of long term care plan for management of cardiovascular disease in a patient with CHF, HTN, DM, pulmonary HTN   Priority: Medium     Long-Range Goal: Symptom Exacerbation Prevented or Minimized   Start Date: 06/27/2021  Expected End Date: 12/28/2021  Priority: Medium  Note:   Current Barriers: recent admission 05/25/21-05/29/21 with congestive heart failure and AKI. Patient with history of HTN, Pulmonary Hypertension with chronic respiratory failure. On continuous Oxygen at 2 liters/[place] at rest. Denies any signs/symptoms of heart failure exacerbation at this time.  Knowledge deficit related to long term care plan for self-management of cardiac disease  in a patient with CHF, HTN, AKI, Pulmonary HTN, renal cell carcinoma and liver cirrhosis. Transportation Barriers-does not have consistent transportation to her appointments. Reports she has applied for transportation with [facility]. Case Manager Clinical Goal(s):  patient will weigh self daily and record patient will verbalize understanding of Heart Failure Action Plan and when to call doctor patient will take all cardiac mediations as prescribed Patient will check blood pressure as recommended by providers Interventions:  Collaboration with Carollee Herter, Alferd Apa, DO regarding development and update of comprehensive plan of care as evidenced by provider attestation and co-signature Inter-disciplinary care team collaboration (see longitudinal plan of care) Provided verbal education on low sodium diet Reviewed Heart Failure Action Plan in depth and provided written copy Assessed need for readable accurate scales in home Reviewed role of diuretics in prevention of fluid overload and management of heart failure Medications reviewed. Patient states that she takes Apidra insulin 2-3 times/day sometimes does not take Apidra before every meal. Also request assistance with cost for obtaining zetia and simvastatin. She reports she has some Vytorin and is currently using that before she gets the Zetia and Simvastatin filled and would like to know if there is assistance available for cost. RNCM encouraged patient to take medications as prescribed. Reviewed upcoming appointment with patient. Provided education on heart disease in diabetes and heat failure Collaboration with clinical pharmacist regarding medication needs Collaboration with care guide regarding transportation questions/needs Patient Goals: - develop a rescue plan and follow rescue plan if symptoms flare-up - eat more whole grains, fruits and vegetables, lean meats and healthy fats, monitor salt intake - know when to call the doctor -  track symptoms and what helps feel better or worse - weigh and record your weights daily. Call office if you gain more than 3 pounds in one day or 5 pounds in one week, have increases swelling in stomach, hands feet or legs, - Monitor blood pressure and call if your blood pressure is outside of recommended parameters - Take your medications as prescribed by your doctor - Attend provider visits as recommended/scheduled - Please review education provided on heart disease in diabetics and heart failure. Plan to discuss at next telephone call. - Call your nurse care coordinator if you continue to have difficulty getting transportation to your appointments. Follow Up Plan: Telephone follow up appointment with care management team member scheduled for: next month The patient has been provided with contact information for the care management team and has been advised to call with any health related questions or concerns.      Care Plan : Diabetes Type 2 (Adult)  Updates made by Luretha Rued, RN since 06/27/2021 12:00 AM     Problem: Glycemic Management (Diabetes, Type 2)  Long-Range Goal: Glycemic Management Optimized   Start Date: 06/27/2021  Expected End Date: 12/28/2021  Priority: Medium  Note:   Objective:  Lab Results  Component Value Date   HGBA1C 6.3 (H) 05/25/2021   Lab Results  Component Value Date   CREATININE 1.30 (H) 06/09/2021   CREATININE 1.54 (H) 05/29/2021   CREATININE 1.78 (H) 05/28/2021  Current Barriers:  Knowledge Deficits related to long term care plan for self-management of Diabetes  Does not check blood sugar as recommended Does not take insulin as prescribed    Case Manager Clinical Goal(s):  patient will demonstrate improved adherence to prescribed treatment plan for diabetes self care/management as evidenced by: improved daily monitoring and recording of CBG  adherence to ADA/ carb modified diet adherence to prescribed medication regimen Interventions:   Collaboration with Carollee Herter, Alferd Apa, DO regarding development and update of comprehensive plan of care as evidenced by provider attestation and co-signature Inter-disciplinary care team collaboration (see longitudinal plan of care) Reviewed medications with patient and discussed importance of medication adherence. Patient states that she takes Apidra insulin 2-3 times/day sometimes does not take Apidra before every meal. Also request assistance with cost for obtaining zetia and simvastatin. She reports she has some Vytorin and is currently using that before she gets the Zetia and Simvastatin filled and would like to know if there is assistance available for cost. Reviewed scheduled/upcoming provider appointments. Collaboration with clinical pharmacist previously referred upcoming appointment scheduled for 07/20/21 Collaboration with care guide team for questions regarding transportation application. Patient encouraged to check blood sugar as recommended. Client reports she sometimes does not want to stick her fingers. Discuss continuous glucose monitoring system. However, client declines this as an option for her.   Provided education related to diabetes. Discussed plans with patient for ongoing care management follow up and provided patient with direct contact information for care management team Patient Goals: - check blood sugar at prescribed times and check blood sugar if you feel it is too high or too low - take the blood sugar log and meter to all doctor visits - take your medications as prescribed. - attend provider visits as recommended - review education material provided related to heart disease in diabetics. Plan to discuss at next telephone call. Follow Up Plan: Telephone follow up appointment with care management team member scheduled for: next month   Plan:Telephone follow up appointment with care management team member scheduled for:  next month and The patient has been provided  with contact information for the care management team and has been advised to call with any health related questions or concerns.    Thea Silversmith, RN, MSN, BSN, CCM Care Management Coordinator [facility] [phone]

## 2021-06-27 NOTE — Patient Instructions (Signed)
Visit Information   PATIENT GOALS:   Goals Addressed             This Visit's Progress    Monitor and Manage My Blood Sugar-Diabetes Type 2       Timeframe:  Long-Range Goal Priority:  Medium Start Date:  06/27/2021                           Expected End Date: 12/28/2021                      Follow Up Date 07/28/2021   - check blood sugar at prescribed times and check blood sugar if you feel it is too high or too low - take the blood sugar log and meter to all doctor visits - take your medications as prescribed. - attend provider visits as recommended - review education material provided related to heart disease in diabetics. Plan to discuss at next telephone call.   Why is this important?   Checking your blood sugar at home helps to keep it from getting very high or very low.  Writing the results in a diary or log helps the doctor know how to care for you.  Your blood sugar log should have the time, date and the results.  Also, write down the amount of insulin or other medicine that you take.  Other information, like what you ate, exercise done and how you were feeling, will also be helpful.     Notes:      Track and Manage Symptoms-Heart Failure       Timeframe:  Long-Range Goal Priority:  Medium Start Date:  06/27/2021                           Expected End Date:  12/28/2021                     Follow Up Date 07/28/21   Patient Goals: - develop a rescue plan and follow rescue plan if symptoms flare-up - eat more whole grains, fruits and vegetables, lean meats and healthy fats, monitor salt intake - know when to call the doctor - track symptoms and what helps feel better or worse - weigh and record your weights daily. Call office if you gain more than 3 pounds in one day or 5 pounds in one week, have increases swelling in stomach, hands feet or legs, - Monitor blood pressure and call if your blood pressure is outside of recommended parameters - Take your medications as  prescribed by your doctor - Attend provider visits as recommended/scheduled - Call your nurse care coordinator if you continue to have difficulty getting transportation to your appointments.   Why is this important?   You will be able to handle your symptoms better if you keep track of them.  Making some simple changes to your lifestyle will help.  Eating healthy is one thing you can do to take good care of yourself.    Notes:         Consent to CCM Services: Ms. Langer was given information about Chronic Care Management services today including:  CCM service includes personalized support from designated clinical staff supervised by her physician, including individualized plan of care and coordination with other care providers 24/7 contact phone numbers for assistance for urgent and routine care needs. Service will only be billed when office clinical  staff spend 20 minutes or more in a month to coordinate care. Only one practitioner may furnish and bill the service in a calendar month. The patient may stop CCM services at any time (effective at the end of the month) by phone call to the office staff. The patient will be responsible for cost sharing (co-pay) of up to 20% of the service fee (after annual deductible is met).  Patient agreed to services and verbal consent obtained.   The patient verbalized understanding of instructions, educational materials, and care plan provided today and agreed to receive a mailed copy of patient instructions, educational materials, and care plan.   Telephone follow up appointment with care management team member scheduled for: 07/28/21 The patient has been provided with contact information for the care management team and has been advised to call with any health related questions or concerns.   Thea Silversmith, RN, MSN, BSN, CCM Care Management Coordinator [facility] [phone]   CLINICAL CARE PLAN: Patient Care Plan:  Cardiovascular disease     Problem Identified: Symptom Exacerbation (Heart Failure) Resolved 06/27/2021     Goal: Symptom Exacerbation Prevented or Minimized Completed 06/27/2021  Note:   Evidence-based guidance:  Perform or review cognitive and/or health literacy screening.  Assess understanding of adherence and barriers to treatment plan, as well as lifestyle changes; develop strategies to address barriers.  Establish a mutually-agreed-upon early intervention process to communicate with primary care provider when signs/symptoms worsen.  Facilitate timely posthospital discharge or emergency department treatment that includes intensive follow-up via telephone calls, home visit, telehealth monitoring and care at multidisciplinary heart failure clinic.  Adjust frequency and intensity of follow-up based on presentation, number of emergency department visits, hospital admissions and frequency and severity of symptom exacerbation.  Facilitate timely visit, usually within 1 week, with primary care provider following hospital discharge.  Collaborate with clinical pharmacist to address adverse drug reactions, drug interactions, subtherapeutic dosage, patient and family education.  Regularly screen for presence of depressive symptoms using a validated tool; consider pharmacologic therapy and/or referral for cognitive behavioral therapy when present.  Refer to community-based services, such as a heart failure support group, community Economist or peer support program.  Review immunization status; arrange receipt of needed vaccinations.  Prepare patient for home oxygen use based on signs/symptoms.   Notes:     Problem Identified: lack of long term care plan for managment of cardiovasculare disease in a patient with CHF, HTN, DM, pulmonary HTN   Priority: Medium     Long-Range Goal: Symptom Exacerbation Prevented or Minimized   Start Date: 06/27/2021  Expected End Date: 12/28/2021  Priority: Medium   Note:   Current Barriers: recent admission 05/25/21-05/29/21 with congestive heart failure and AKI. Patient with history of HTN, Pulmonary Hypertension with chronic respiratory failure. On continuous Oxygen at 2 liters/[place] at rest. Denies any signs/symptoms of heart failure exacerbation at this time.  Knowledge deficit related to long term care plan for self-management of cardiac disease in a patient with CHF, HTN, AKI, Pulmonary HTN, renal cell carcinoma and liver cirrhosis. Transportation Barriers-does not have consistent transportation to her appointments. Reports she has applied for transportation with [facility]. Case Manager Clinical Goal(s):  patient will weigh self daily and record patient will verbalize understanding of Heart Failure Action Plan and when to call doctor patient will take all cardiac mediations as prescribed Patient will check blood pressure as recommended by providers Interventions:  Collaboration with Carollee Herter, Alferd Apa, DO regarding development and  update of comprehensive plan of care as evidenced by provider attestation and co-signature Inter-disciplinary care team collaboration (see longitudinal plan of care) Provided verbal education on low sodium diet Reviewed Heart Failure Action Plan in depth and provided written copy Assessed need for readable accurate scales in home Reviewed role of diuretics in prevention of fluid overload and management of heart failure Medications reviewed. Patient states that she takes Apidra insulin 2-3 times/day sometimes does not take Apidra before every meal. Also request assistance with cost for obtaining zetia and simvastatin. She reports she has some Vytorin and is currently using that before she gets the Zetia and Simvastatin filled and would like to know if there is assistance available for cost. RNCM encouraged patient to take medications as prescribed. Reviewed upcoming appointment with patient. Provided education on heart  disease in diabetes and heat failure Collaboration with clinical pharmacist regarding medication needs Collaboration with care guide regarding transportation questions/needs Patient Goals: - develop a rescue plan and follow rescue plan if symptoms flare-up - eat more whole grains, fruits and vegetables, lean meats and healthy fats, monitor salt intake - know when to call the doctor - track symptoms and what helps feel better or worse - weigh and record your weights daily. Call office if you gain more than 3 pounds in one day or 5 pounds in one week, have increases swelling in stomach, hands feet or legs, - Monitor blood pressure and call if your blood pressure is outside of recommended parameters - Take your medications as prescribed by your doctor - Attend provider visits as recommended/scheduled - Please review education provided on heart disease in diabetics and heart failure. Plan to discuss at next telephone call. - Call your nurse care coordinator if you continue to have difficulty getting transportation to your appointments. Follow Up Plan: Telephone follow up appointment with care management team member scheduled for: next month The patient has been provided with contact information for the care management team and has been advised to call with any health related questions or concerns.      Patient Care Plan: Diabetes Type 2 (Adult)     Problem Identified: Glycemic Management (Diabetes, Type 2)   Priority: Medium     Long-Range Goal: Glycemic Management Optimized   Start Date: 06/27/2021  Expected End Date: 12/28/2021  Priority: Medium  Note:   Objective:  Lab Results  Component Value Date   HGBA1C 6.3 (H) 05/25/2021   Lab Results  Component Value Date   CREATININE 1.30 (H) 06/09/2021   CREATININE 1.54 (H) 05/29/2021   CREATININE 1.78 (H) 05/28/2021  Current Barriers:  Knowledge Deficits related to long term care plan for self-management of Diabetes  Does not check  blood sugar as recommended Does not take insulin as prescribed    Case Manager Clinical Goal(s):  patient will demonstrate improved adherence to prescribed treatment plan for diabetes self care/management as evidenced by: improved daily monitoring and recording of CBG  adherence to ADA/ carb modified diet adherence to prescribed medication regimen Interventions:  Collaboration with Carollee Herter, Alferd Apa, DO regarding development and update of comprehensive plan of care as evidenced by provider attestation and co-signature Inter-disciplinary care team collaboration (see longitudinal plan of care) Reviewed medications with patient and discussed importance of medication adherence. Patient states that she takes Apidra insulin 2-3 times/day sometimes does not take Apidra before every meal. Also request assistance with cost for obtaining zetia and simvastatin. She reports she has some Vytorin and is currently using that before  she gets the Zetia and Simvastatin filled and would like to know if there is assistance available for cost. Reviewed scheduled/upcoming provider appointments. Collaboration with clinical pharmacist previously referred upcoming appointment scheduled for 07/20/21 Collaboration with care guide team for questions regarding transportation application. Patient encouraged to check blood sugar as recommended. Client reports she sometimes does not want to stick her fingers. Discuss continuous glucose monitoring system. However, client declines this as an option for her.   Provided education related to diabetes. Discussed plans with patient for ongoing care management follow up and provided patient with direct contact information for care management team Patient Goals: - check blood sugar at prescribed times and check blood sugar if you feel it is too high or too low - take the blood sugar log and meter to all doctor visits - take your medications as prescribed. - attend provider visits as  recommended - review education material provided related to heart disease in diabetics. Plan to discuss at next telephone call. Follow Up Plan: Telephone follow up appointment with care management team member scheduled for: next month

## 2021-06-28 ENCOUNTER — Telehealth: Payer: Self-pay

## 2021-06-28 DIAGNOSIS — I081 Rheumatic disorders of both mitral and tricuspid valves: Secondary | ICD-10-CM | POA: Diagnosis not present

## 2021-06-28 DIAGNOSIS — N1832 Chronic kidney disease, stage 3b: Secondary | ICD-10-CM | POA: Diagnosis not present

## 2021-06-28 DIAGNOSIS — I5033 Acute on chronic diastolic (congestive) heart failure: Secondary | ICD-10-CM | POA: Diagnosis not present

## 2021-06-28 DIAGNOSIS — I50810 Right heart failure, unspecified: Secondary | ICD-10-CM | POA: Diagnosis not present

## 2021-06-28 DIAGNOSIS — I130 Hypertensive heart and chronic kidney disease with heart failure and stage 1 through stage 4 chronic kidney disease, or unspecified chronic kidney disease: Secondary | ICD-10-CM | POA: Diagnosis not present

## 2021-06-28 DIAGNOSIS — E1122 Type 2 diabetes mellitus with diabetic chronic kidney disease: Secondary | ICD-10-CM | POA: Diagnosis not present

## 2021-06-28 NOTE — Telephone Encounter (Signed)
Telephone encounter was:  Successful.  06/28/2021 Name: ESMEE FALLAW MRN: [id number] DOB: Dec 08, 1934  ALVETA QUINTELA is a 86 y.o. year old female who is a primary care patient of Ann Held, DO . The community resource team was consulted for assistance with Transportation Needs   Care guide performed the following interventions: Spoke with patient's daughter [name] about transportation options.  Patient has already submitted an application for [place].  Gave information for [employer] and [employer] if there is a last minute appointment and nothing else is available.  Follow Up Plan:  No further follow up planned at this time. The patient has been provided with needed resources.  Meisha Salone, AAS Paralegal, [facility]  [address], [place] [address] ??[email or web address]  ?? [phone]   [email or web address]

## 2021-06-30 DIAGNOSIS — L304 Erythema intertrigo: Secondary | ICD-10-CM | POA: Diagnosis not present

## 2021-07-02 DIAGNOSIS — I313 Pericardial effusion (noninflammatory): Secondary | ICD-10-CM | POA: Diagnosis not present

## 2021-07-02 DIAGNOSIS — I50810 Right heart failure, unspecified: Secondary | ICD-10-CM | POA: Diagnosis not present

## 2021-07-02 DIAGNOSIS — K7460 Unspecified cirrhosis of liver: Secondary | ICD-10-CM | POA: Diagnosis not present

## 2021-07-02 DIAGNOSIS — E049 Nontoxic goiter, unspecified: Secondary | ICD-10-CM | POA: Diagnosis not present

## 2021-07-02 DIAGNOSIS — N1832 Chronic kidney disease, stage 3b: Secondary | ICD-10-CM | POA: Diagnosis not present

## 2021-07-02 DIAGNOSIS — N3281 Overactive bladder: Secondary | ICD-10-CM | POA: Diagnosis not present

## 2021-07-02 DIAGNOSIS — I130 Hypertensive heart and chronic kidney disease with heart failure and stage 1 through stage 4 chronic kidney disease, or unspecified chronic kidney disease: Secondary | ICD-10-CM | POA: Diagnosis not present

## 2021-07-02 DIAGNOSIS — E1122 Type 2 diabetes mellitus with diabetic chronic kidney disease: Secondary | ICD-10-CM | POA: Diagnosis not present

## 2021-07-02 DIAGNOSIS — M170 Bilateral primary osteoarthritis of knee: Secondary | ICD-10-CM | POA: Diagnosis not present

## 2021-07-02 DIAGNOSIS — I081 Rheumatic disorders of both mitral and tricuspid valves: Secondary | ICD-10-CM | POA: Diagnosis not present

## 2021-07-02 DIAGNOSIS — J9621 Acute and chronic respiratory failure with hypoxia: Secondary | ICD-10-CM | POA: Diagnosis not present

## 2021-07-02 DIAGNOSIS — D696 Thrombocytopenia, unspecified: Secondary | ICD-10-CM | POA: Diagnosis not present

## 2021-07-02 DIAGNOSIS — M19042 Primary osteoarthritis, left hand: Secondary | ICD-10-CM | POA: Diagnosis not present

## 2021-07-02 DIAGNOSIS — M19012 Primary osteoarthritis, left shoulder: Secondary | ICD-10-CM | POA: Diagnosis not present

## 2021-07-02 DIAGNOSIS — M19041 Primary osteoarthritis, right hand: Secondary | ICD-10-CM | POA: Diagnosis not present

## 2021-07-02 DIAGNOSIS — I2510 Atherosclerotic heart disease of native coronary artery without angina pectoris: Secondary | ICD-10-CM | POA: Diagnosis not present

## 2021-07-02 DIAGNOSIS — M19011 Primary osteoarthritis, right shoulder: Secondary | ICD-10-CM | POA: Diagnosis not present

## 2021-07-02 DIAGNOSIS — K7581 Nonalcoholic steatohepatitis (NASH): Secondary | ICD-10-CM | POA: Diagnosis not present

## 2021-07-02 DIAGNOSIS — I5033 Acute on chronic diastolic (congestive) heart failure: Secondary | ICD-10-CM | POA: Diagnosis not present

## 2021-07-02 DIAGNOSIS — J449 Chronic obstructive pulmonary disease, unspecified: Secondary | ICD-10-CM | POA: Diagnosis not present

## 2021-07-02 DIAGNOSIS — M8580 Other specified disorders of bone density and structure, unspecified site: Secondary | ICD-10-CM | POA: Diagnosis not present

## 2021-07-02 DIAGNOSIS — I2723 Pulmonary hypertension due to lung diseases and hypoxia: Secondary | ICD-10-CM | POA: Diagnosis not present

## 2021-07-02 DIAGNOSIS — E785 Hyperlipidemia, unspecified: Secondary | ICD-10-CM | POA: Diagnosis not present

## 2021-07-02 DIAGNOSIS — N179 Acute kidney failure, unspecified: Secondary | ICD-10-CM | POA: Diagnosis not present

## 2021-07-02 DIAGNOSIS — E039 Hypothyroidism, unspecified: Secondary | ICD-10-CM | POA: Diagnosis not present

## 2021-07-04 ENCOUNTER — Other Ambulatory Visit: Payer: Self-pay

## 2021-07-04 ENCOUNTER — Telehealth: Payer: Self-pay

## 2021-07-04 ENCOUNTER — Emergency Department (HOSPITAL_COMMUNITY)
Admission: EM | Admit: 2021-07-04 | Discharge: 2021-07-05 | Disposition: A | Discharge status: Home / Self Care | Payer: Medicare Other | Attending: Emergency Medicine | Admitting: Emergency Medicine

## 2021-07-04 ENCOUNTER — Encounter (HOSPITAL_COMMUNITY): Payer: Self-pay

## 2021-07-04 ENCOUNTER — Emergency Department (HOSPITAL_COMMUNITY): Payer: Medicare Other

## 2021-07-04 DIAGNOSIS — Z87891 Personal history of nicotine dependence: Secondary | ICD-10-CM | POA: Diagnosis not present

## 2021-07-04 DIAGNOSIS — R739 Hyperglycemia, unspecified: Secondary | ICD-10-CM | POA: Diagnosis not present

## 2021-07-04 DIAGNOSIS — I130 Hypertensive heart and chronic kidney disease with heart failure and stage 1 through stage 4 chronic kidney disease, or unspecified chronic kidney disease: Secondary | ICD-10-CM | POA: Diagnosis not present

## 2021-07-04 DIAGNOSIS — I509 Heart failure, unspecified: Secondary | ICD-10-CM

## 2021-07-04 DIAGNOSIS — N184 Chronic kidney disease, stage 4 (severe): Secondary | ICD-10-CM | POA: Insufficient documentation

## 2021-07-04 DIAGNOSIS — J90 Pleural effusion, not elsewhere classified: Secondary | ICD-10-CM

## 2021-07-04 DIAGNOSIS — Z79899 Other long term (current) drug therapy: Secondary | ICD-10-CM | POA: Diagnosis not present

## 2021-07-04 DIAGNOSIS — I517 Cardiomegaly: Secondary | ICD-10-CM | POA: Diagnosis not present

## 2021-07-04 DIAGNOSIS — E039 Hypothyroidism, unspecified: Secondary | ICD-10-CM | POA: Insufficient documentation

## 2021-07-04 DIAGNOSIS — I5033 Acute on chronic diastolic (congestive) heart failure: Secondary | ICD-10-CM | POA: Diagnosis not present

## 2021-07-04 DIAGNOSIS — I959 Hypotension, unspecified: Secondary | ICD-10-CM | POA: Diagnosis not present

## 2021-07-04 DIAGNOSIS — E1122 Type 2 diabetes mellitus with diabetic chronic kidney disease: Secondary | ICD-10-CM | POA: Diagnosis not present

## 2021-07-04 DIAGNOSIS — R0602 Shortness of breath: Secondary | ICD-10-CM | POA: Diagnosis not present

## 2021-07-04 DIAGNOSIS — I10 Essential (primary) hypertension: Secondary | ICD-10-CM | POA: Diagnosis not present

## 2021-07-04 DIAGNOSIS — Z7982 Long term (current) use of aspirin: Secondary | ICD-10-CM | POA: Insufficient documentation

## 2021-07-04 DIAGNOSIS — I110 Hypertensive heart disease with heart failure: Secondary | ICD-10-CM | POA: Diagnosis not present

## 2021-07-04 DIAGNOSIS — Z794 Long term (current) use of insulin: Secondary | ICD-10-CM | POA: Insufficient documentation

## 2021-07-04 LAB — URINALYSIS, ROUTINE W REFLEX MICROSCOPIC
Bilirubin Urine: NEGATIVE
Glucose, UA: 150 mg/dL — AB
Hgb urine dipstick: NEGATIVE
Ketones, ur: NEGATIVE mg/dL
Leukocytes,Ua: NEGATIVE
Nitrite: NEGATIVE
Protein, ur: NEGATIVE mg/dL
Specific Gravity, Urine: 1.008 (ref 1.005–1.030)
pH: 7 (ref 5.0–8.0)

## 2021-07-04 LAB — BASIC METABOLIC PANEL
Anion gap: 10 (ref 5–15)
BUN: 37 mg/dL — ABNORMAL HIGH (ref 8–23)
CO2: 31 mmol/L (ref 22–32)
Calcium: 9.2 mg/dL (ref 8.9–10.3)
Chloride: 95 mmol/L — ABNORMAL LOW (ref 98–111)
Creatinine, Ser: 1.31 mg/dL — ABNORMAL HIGH (ref 0.44–1.00)
GFR, Estimated: 40 mL/min — ABNORMAL LOW (ref >60)
Glucose, Bld: 289 mg/dL — ABNORMAL HIGH (ref 70–99)
Potassium: 4.4 mmol/L (ref 3.5–5.1)
Sodium: 136 mmol/L (ref 135–145)

## 2021-07-04 LAB — CBC WITH DIFFERENTIAL/PLATELET
Abs Immature Granulocytes: 0.05 10*3/uL (ref 0.00–0.07)
Basophils Absolute: 0 10*3/uL (ref 0.0–0.1)
Basophils Relative: 0 %
Eosinophils Absolute: 0.1 10*3/uL (ref 0.0–0.5)
Eosinophils Relative: 2 %
HCT: 32.1 % — ABNORMAL LOW (ref 36.0–46.0)
Hemoglobin: 10 g/dL — ABNORMAL LOW (ref 12.0–15.0)
Immature Granulocytes: 1 %
Lymphocytes Relative: 9 %
Lymphs Abs: 0.6 10*3/uL — ABNORMAL LOW (ref 0.7–4.0)
MCH: 29.9 pg (ref 26.0–34.0)
MCHC: 31.2 g/dL (ref 30.0–36.0)
MCV: 96.1 fL (ref 80.0–100.0)
Monocytes Absolute: 0.7 10*3/uL (ref 0.1–1.0)
Monocytes Relative: 10 %
Neutro Abs: 5.4 10*3/uL (ref 1.7–7.7)
Neutrophils Relative %: 78 %
Platelets: UNDETERMINED 10*3/uL (ref 150–400)
RBC: 3.34 MIL/uL — ABNORMAL LOW (ref 3.87–5.11)
RDW: 13.8 % (ref 11.5–15.5)
WBC: 6.9 10*3/uL (ref 4.0–10.5)
nRBC: 0 % (ref 0.0–0.2)

## 2021-07-04 LAB — MAGNESIUM: Magnesium: 2 mg/dL (ref 1.7–2.4)

## 2021-07-04 LAB — TROPONIN I (HIGH SENSITIVITY)
Troponin I (High Sensitivity): 15 ng/L (ref <18)
Troponin I (High Sensitivity): 12 ng/L (ref <18)

## 2021-07-04 LAB — BRAIN NATRIURETIC PEPTIDE: B Natriuretic Peptide: 321.1 pg/mL — ABNORMAL HIGH (ref 0.0–100.0)

## 2021-07-04 MED ORDER — FUROSEMIDE 10 MG/ML IJ SOLN
20.0000 mg | Freq: Once | INTRAMUSCULAR | Status: AC
  Administered 2021-07-04: 20 mg via INTRAVENOUS
  Filled 2021-07-04: qty 2

## 2021-07-04 MED ORDER — ALBUTEROL SULFATE HFA 108 (90 BASE) MCG/ACT IN AERS: 2.0000 | INHALATION_SPRAY | RESPIRATORY_TRACT | Status: DC | PRN

## 2021-07-04 NOTE — ED Notes (Addendum)
Pt O2 sats 98-100% while ambulating

## 2021-07-04 NOTE — ED Notes (Signed)
Called [facility] for patient to be transported home. Patient is 11th on the list.

## 2021-07-04 NOTE — ED Provider Notes (Signed)
Emergency Medicine Provider Triage Evaluation Note  Margaret Dyer , a 86 y.o. female  was evaluated in triage.  Pt complains of shortness of breath x1 month since her recent hospital admission in June for SOB.  Unable to walk more than a few steps without feeling she can pass out.  Review of Systems  Positive: Shortness of breath, lower extremity swelling, lightheadedness, intermittent chest pain Negative: Abdominal pain, nausea, vomiting, syncope  Physical Exam  BP (!) 170/69 (BP Location: Left Arm)   Pulse 66   Temp 99 F (37.2 C) (Oral)   Resp 20   LMP  (LMP Unknown)   SpO2 97%  Gen:   Awake, no distress   Resp:  Normal effort  MSK:   Moves extremities without difficulty  Other:  On 3 L submental oxygen by nasal cannula, her baseline.  Rales throughout the lung fields bilaterally primarily in the lower fields.  1+ lower extremity edema bilaterally without pitting.  Medical Decision Making  Medically screening exam initiated at 1:50 PM.  Appropriate orders placed.  [name] was informed that the remainder of the evaluation will be completed by another provider, this initial triage assessment does not replace that evaluation, and the importance of remaining in the ED until their evaluation is complete.  This chart was dictated using voice recognition software, Dragon. Despite the best efforts of this provider to proofread and correct errors, errors may still occur which can change documentation meaning.    Emeline Darling, PA-C 07/04/21 1354    Regan Lemming, MD 07/04/21 2027

## 2021-07-04 NOTE — Telephone Encounter (Signed)
Pt called. Pt verbalized understanding and will go to the ED

## 2021-07-04 NOTE — ED Provider Notes (Addendum)
[facility] EMERGENCY DEPARTMENT Provider Note   CSN: [id number] Arrival date & time: 07/04/21  1336     History Chief Complaint  Patient presents with   Shortness of Breath    Margaret Dyer is a 86 y.o. female.  Patient is a 86 yo female with pmh of CHF, asthma, CKD stage 3, DM, HTN, hyperlipidemia, pulmonary HTN, RCC, and liver cirrhosis presenting for complaints of sob. Patient admits to progressive exertional dyspnea x 1 month worse while walking more than three steps. Admits to orthopnea and worsening lower extremity bilateral swelling. Compliant with Torsemide 20 mg daily. Denies chest pain. Admits to nasal congestion upon waking. Denies fever, chills, cough, nausea, vomiting, or diarrhea. Denies hx of DVT/PE, unilateral leg swelling, calf pain, recent surgeries/prolonged immobilization, or hormone use.   The history is provided by the patient. No language interpreter was used.  Shortness of Breath Severity:  Moderate Onset quality:  Gradual Duration:  1 month Timing:  Constant Progression:  Worsening Chronicity:  Recurrent Associated symptoms: no abdominal pain, no chest pain, no cough, no fever, no neck pain, no rash, no sore throat and no vomiting       Past Medical History:  Diagnosis Date   Asthma    Blood transfusion    Chronic diastolic CHF (congestive heart failure) (HCC)    CKD (chronic kidney disease), stage III (HCC)    Coronary artery calcification seen on CT scan    Diabetes mellitus    Goiter    Hyperlipidemia    Hypertension    Hypothyroidism    Liver cirrhosis (HCC)    NASH (nonalcoholic steatohepatitis)    Obesity    Osteopenia    Pulmonary hypertension (HCC)    Renal cell carcinoma    a. prior nephrectomy.   Right heart failure (HCC)    Thrombocytopenia ([place])     Patient Active Problem List   Diagnosis Date Noted   Uncontrolled type 2 diabetes mellitus with hyperglycemia ([place]) 06/09/2021   Chronic renal disease, stage 4,  severely decreased glomerular filtration rate (GFR) between 15-29 mL/min/1.73 square meter ([place]) 06/09/2021   CHF exacerbation ([place]) 05/25/2021   Acute on chronic diastolic CHF (congestive heart failure) ([place]) 05/25/2021   Closed fracture of maxillary sinus ([place]) 07/10/2019   UTI (urinary tract infection) 07/03/2019   Food poisoning 07/03/2019   Bacteremia 07/03/2019   Exposure to COVID-19 virus 06/16/2019   Closed left hip fracture ([place]) 12/24/2018   Dysuria 12/17/2017   Dizziness 12/17/2017   Community acquired pneumonia 12/17/2017   Pneumonia 12/01/2017   Urinary incontinence 11/09/2017   Tic 11/09/2017   Pulmonary hypertension ([place]) 09/12/2017   Chronic diastolic CHF (congestive heart failure) ([place]) 06/05/2017   Oral candida 05/29/2017   Vitamin D deficiency 05/29/2017   Chronic respiratory failure ([place]) 05/24/2017   Anxiety 05/24/2017   HCAP (healthcare-associated pneumonia) 05/22/2017   Right heart failure ([place]) 78/29/5621   Acute diastolic CHF (congestive heart failure) ([place])    Exertional dyspnea 05/13/2017   Hypertension 05/13/2017   Asthma 05/13/2017   DOE (dyspnea on exertion) 05/13/2017   Diabetes mellitus type 2, insulin dependent ([place]) 05/13/2017   Acute kidney injury ([place]) 05/13/2017   History of nephrectomy 05/13/2017   Hyperlipidemia LDL goal <70 05/13/2017   Liver cirrhosis secondary to NASH ([place]) 05/13/2017   Thrombocytopenia ([place]) 05/13/2017   Renal cell carcinoma 05/13/2017   Hypothyroidism 05/13/2017   Hypersplenism 05/13/2017   Osteoarthritis 05/13/2017   Overactive bladder 05/13/2017   URI,  acute 02/22/2017   Abrasion, left knee, initial encounter 02/22/2017   CTS (carpal tunnel syndrome) 03/04/2016   Bilateral contusion of ribs 10/05/2015   Dental abscess 04/15/2014   Claudication, intermittent ([place]) 12/05/2013   Obesity (BMI 30-39.9) 07/22/2013   Back pain 05/26/2013   Platelets decreased ([place]) 05/26/2013   Breast pain, right 04/21/2013    Anxiety and depression 12/05/2012   Bronchitis 11/06/2012   HEMATURIA UNSPECIFIED 02/17/2011   Hyperlipidemia 09/08/2010   B12 DEFICIENCY 07/22/2010   DIZZINESS 07/04/2010   Essential hypertension 06/21/2010   GOUT, UNSPECIFIED 01/27/2010   NEPHRECTOMY, HX OF 12/02/2009   NECK PAIN, LEFT 09/04/2008   PARESTHESIA 09/02/2007   GOITER NOS 04/23/2007   Diabetes mellitus ([place]) 04/23/2007   Asthma 04/23/2007   Renal stone 04/23/2007   OSTEOPENIA 04/23/2007   CARCINOMA, RENAL CELL 08/10/1999    Past Surgical History:  Procedure Laterality Date   ABDOMINAL HYSTERECTOMY     APPENDECTOMY     HEMORRHOID SURGERY     INTRAMEDULLARY (IM) NAIL INTERTROCHANTERIC Left 12/25/2018   Procedure: INTRAMEDULLARY (IM) NAIL INTERTROCHANTRIC;  Surgeon: Nicholes Stairs, MD;  Location: [facility];  Service: Orthopedics;  Laterality: Left;   KNEE SURGERY     NEPHRECTOMY  09.2000     OB History   No obstetric history on file.     Family History  Problem Relation Age of Onset   Cancer Sister        bladder   Kidney cancer Father    Cancer Father        renal   COPD Father    Congestive Heart Failure Father    Coronary artery disease Other    Diabetes Other    Hyperlipidemia Other    Hypertension Other    Rheumatologic disease Neg Hx     Social History   Tobacco Use   Smoking status: Former   Smokeless tobacco: Never   Tobacco comments:    05/30/18 none in 40 years  Vaping Use   Vaping Use: Never used  Substance Use Topics   Alcohol use: No    Alcohol/week: 0.0 standard drinks   Drug use: No    Home Medications Prior to Admission medications   Medication Sig Start Date End Date Taking? Authorizing Provider  albuterol (PROVENTIL) (2.5 MG/3ML) 0.083% nebulizer solution Take 3 mLs (2.5 mg total) by nebulization every 6 (six) hours as needed for wheezing or shortness of breath. 06/09/21   Ann Held, DO  aspirin EC 81 MG EC tablet Take 1 tablet (81 mg total) by mouth daily.  07/07/19   Geradine Girt, DO  docusate sodium (COLACE) 100 MG capsule Take 1 capsule (100 mg total) by mouth 2 (two) times daily. 12/30/18   Bonnell Public, MD  ezetimibe (ZETIA) 10 MG tablet Take 1 tablet (10 mg total) by mouth daily. 06/09/21   Ann Held, DO  hydrALAZINE (APRESOLINE) 25 MG tablet Take 1 tablet (25 mg total) by mouth every 8 (eight) hours. 05/29/21   Mikhail, Velta Addison, DO  insulin glargine (LANTUS) 100 UNIT/ML injection Inject 0.25 mLs (25 Units total) into the skin at bedtime. 07/06/19   Geradine Girt, DO  Insulin Glulisine (APIDRA SOLOSTAR) 100 UNIT/ML Solostar Pen Inject 10-15 Units into the skin 3 (three) times daily before meals.     [provider]  levothyroxine (SYNTHROID, LEVOTHROID) 25 MCG tablet take 1 tablet by mouth daily 07/13/17   Carollee Herter, Alferd Apa, DO  metoprolol tartrate (LOPRESSOR)  25 MG tablet Take 1 tablet (25 mg total) by mouth 2 (two) times daily. 11/08/20   Hilty, Nadean Corwin, MD  Multiple Vitamins-Minerals (CENTRUM SILVER ULTRA WOMENS PO) Take 1 tablet by mouth at bedtime.     [provider]  oxybutynin (DITROPAN) 5 MG tablet TAKE 1 TABLET BY MOUTH DAILY 04/08/21   Carollee Herter, Alferd Apa, DO  polyethylene glycol (MIRALAX / GLYCOLAX) 17 g packet Take 17 g by mouth daily as needed.    [provider]  potassium chloride SA (KLOR-CON) 20 MEQ tablet TAKE 1 TABLET BY MOUTH DAILY AT BEDTIME 02/24/21   Hilty, Nadean Corwin, MD  simvastatin (ZOCOR) 40 MG tablet Take 1 tablet (40 mg total) by mouth at bedtime. 06/09/21   Ann Held, DO  sucralfate (CARAFATE) 1 g tablet Take 1 tablet (1 g total) by mouth 4 (four) times daily -  with meals and at bedtime. Patient taking differently: Take 1 g by mouth daily as needed. 12/02/19   Ann Held, DO  torsemide (DEMADEX) 20 MG tablet Take 1 tablet (20 mg total) by mouth daily. 06/21/21   Hilty, Nadean Corwin, MD  traMADol (ULTRAM) 50 MG tablet Take 1 tablet (50 mg total) by mouth  every 6 (six) hours as needed for moderate pain. 06/09/21   Ann Held, DO    Allergies    Other, Cefuroxime axetil, and Ciprofloxacin  Review of Systems   Review of Systems  Constitutional:  Negative for chills and fever.  HENT:  Positive for congestion. Negative for rhinorrhea and sore throat.   Eyes:  Negative for pain and visual disturbance.  Respiratory:  Positive for shortness of breath. Negative for cough.   Cardiovascular:  Positive for leg swelling. Negative for chest pain and palpitations.  Gastrointestinal:  Negative for abdominal pain, diarrhea, nausea and vomiting.  Genitourinary:  Negative for dysuria and hematuria.  Musculoskeletal:  Negative for arthralgias and neck pain.  Skin:  Negative for color change and rash.  Neurological:  Negative for seizures and syncope.  Psychiatric/Behavioral:  Negative for agitation and confusion.   All other systems reviewed and are negative.  Physical Exam Updated Vital Signs BP (!) 167/71 (BP Location: Right Arm)   Pulse 80   Temp 99.1 F (37.3 C) (Oral)   Resp 18   LMP  (LMP Unknown)   SpO2 96%   Physical Exam Vitals and nursing note reviewed.  Constitutional:      General: She is not in acute distress.    Appearance: She is well-developed.  HENT:     Head: Normocephalic and atraumatic.  Eyes:     Conjunctiva/sclera: Conjunctivae normal.  Cardiovascular:     Rate and Rhythm: Normal rate and regular rhythm.     Heart sounds: No murmur heard. Pulmonary:     Effort: Pulmonary effort is normal. Tachypnea present. No respiratory distress.     Breath sounds: Examination of the right-lower field reveals rales. Examination of the left-lower field reveals rales. Rales present.  Abdominal:     Palpations: Abdomen is soft.     Tenderness: There is no abdominal tenderness.  Musculoskeletal:     Cervical back: Neck supple.     Right lower leg: 2+ Edema present.     Left lower leg: 2+ Edema present.  Skin:    General:  Skin is warm and dry.     Capillary Refill: Capillary refill takes less than 2 seconds.     Findings: No rash.  Neurological:     Mental Status: She is alert and oriented to person, place, and time.  Psychiatric:        Mood and Affect: Mood normal.        Behavior: Behavior normal.    ED Results / Procedures / Treatments   Labs (all labs ordered are listed, but only abnormal results are displayed) Labs Reviewed  CBC WITH DIFFERENTIAL/PLATELET - Abnormal; Notable for the following components:      Result Value   RBC 3.34 (*)    Hemoglobin 10.0 (*)    HCT 32.1 (*)    Lymphs Abs 0.6 (*)    All other components within normal limits  BASIC METABOLIC PANEL - Abnormal; Notable for the following components:   Chloride 95 (*)    Glucose, Bld 289 (*)    BUN 37 (*)    Creatinine, Ser 1.31 (*)    GFR, Estimated 40 (*)    All other components within normal limits  BRAIN NATRIURETIC PEPTIDE - Abnormal; Notable for the following components:   B Natriuretic Peptide 321.1 (*)    All other components within normal limits  MAGNESIUM  TROPONIN I (HIGH SENSITIVITY)    EKG No ST segment elevation or depression.  No T wave inversion.  No interval changes.  Radiology DG Chest 2 View  Result Date: 07/04/2021 CLINICAL DATA:  Shortness of breath over the last several months. EXAM: CHEST - 2 VIEW COMPARISON:  05/25/2021 FINDINGS: Chronic cardiomegaly. Aortic atherosclerosis. Abnormal interstitial lung markings that could be due to a combination of chronic lung disease and interstitial edema. Small amount of pleural fluid. No lobar collapse or identifiable mass. Ordinary degenerative changes affect the spine. IMPRESSION: Cardiomegaly, aortic atherosclerosis and interstitial lung density, probably representing mild edema. Small amount of pleural fluid. Cannot rule out a degree of interstitial lung disease. Electronically Signed   By: Nelson Chimes M.D.   On: 07/04/2021 14:40    Procedures Procedures    Medications Ordered in ED Medications  albuterol (VENTOLIN HFA) 108 (90 Base) MCG/ACT inhaler 2 puff (has no administration in time range)    ED Course  I have reviewed the triage vital signs and the nursing notes.  Pertinent labs & imaging results that were available during my care of the patient were reviewed by me and considered in my medical decision making (see chart for details).    MDM Rules/Calculators/A&P                           86 yo female with pmh of CHF, asthma, CKD stage 3, DM, HTN, hyperlipidemia, pulmonary HTN, RCC, and liver cirrhosis presenting for complaints of progressive exertional sob. On physical exam patient is Aox3, no acute distress, low grade temp of 99.1 F, sating at 96 % on home 3 L [place]. Lung sounds are equal bilaterally with minimal rales in lower lung fields. +2 bilateral nonpitting edema present.   EKG on 07/04/2021 stable with no STEMI or NSTEMI. Stable troponin. Laboratory studies demonstrates minimally elevated bnp from baseline and cxr demonstrating pulmonary vascular congestion with small pleural effusion. No leukocytosis.  No pneumonia.  Given physical exam and laboratory workup, low suspicion PE.    Patient symptoms likely secondary to mild CHF exacerbation.  Lasix 20 mg IV given in ED. Given patient's stable vitals, stable oxygenation, stable ambulatory pulse ox, ability to ambulate unassisted without difficulty, no conversational dyspnea, and bnp only mildly elevated from baseline.. patient recommended  for discharge at this time with strict return precautions and close  follow up with cardiologist tomorrow morning for further management of diuretics. Of note, patient has one kidney. She was hospitalized earlier this June with creatinine of 2.31. Current renal function appears to be improving with Cr of 1.31 today. Patient urinating without difficulty. UA as requested by family demonstrates no UTI. Patient recommended to take home torsemide as prescribed.  I also spoke with patient's daughter, Clarene Critchley, who is up-to-date on patient's care plan and need for strict return if symptoms worsen in any way. Patient lives with her daughter Clarene Critchley who agrees to keep a close eye on her. All questions answered prior to discharge.    Final Clinical Impression(s) / ED Diagnoses Final diagnoses:  Acute on chronic congestive heart failure, unspecified heart failure type ([place])  small pleural effusion    Rx / DC Orders ED Discharge Orders     None        Lianne Cure, DO 80/06/34 [id number]    Lianne Cure, DO 47/39/58 2000

## 2021-07-04 NOTE — Discharge Instructions (Addendum)
Today you were found to have a mild exacerbation of your congestive heart failure.  You were given IV diuretic medication to help you any excess fluid improve your shortness of breath and fluid on the lungs.  Continue to take your home dose of furosemide daily follow-up closely with your cardiologist in the next 1 to 3 days for other management of your congestive heart failure medications.  As requested, we checked your kidney function.  You have had improvement of your creatinine and your baseline studies.  We also checked your urine analysis administrated no urinary tract infection.

## 2021-07-04 NOTE — ED Triage Notes (Signed)
Pt BIB [facility] from home for SOB upon exertion x1 month. Pt reports this has been ongoing since she was dc'd from here for the same.upper lobes clear, rales in bilateral lower fields Talking full sentences. 98% 3L [place] which is what pt usually wears.  Pt with a hx of CHF, taking her meds as prescribed    Bp 160/70 HR 70 RR 18 CBG 268

## 2021-07-04 NOTE — Telephone Encounter (Signed)
Pt states that she has been having SOB since she has been home from hospital. We didn't have any appointment for today so I told her to go to ER.

## 2021-07-05 DIAGNOSIS — Z743 Need for continuous supervision: Secondary | ICD-10-CM | POA: Diagnosis not present

## 2021-07-05 DIAGNOSIS — J90 Pleural effusion, not elsewhere classified: Secondary | ICD-10-CM | POA: Diagnosis not present

## 2021-07-05 DIAGNOSIS — R29898 Other symptoms and signs involving the musculoskeletal system: Secondary | ICD-10-CM | POA: Diagnosis not present

## 2021-07-05 DIAGNOSIS — I10 Essential (primary) hypertension: Secondary | ICD-10-CM | POA: Diagnosis not present

## 2021-07-05 DIAGNOSIS — I959 Hypotension, unspecified: Secondary | ICD-10-CM | POA: Diagnosis not present

## 2021-07-05 MED ORDER — METOPROLOL TARTRATE 25 MG PO TABS
25.0000 mg | ORAL_TABLET | Freq: Once | ORAL | Status: AC
  Administered 2021-07-05: 25 mg via ORAL
  Filled 2021-07-05: qty 1

## 2021-07-05 MED ORDER — HYDRALAZINE HCL 25 MG PO TABS
25.0000 mg | ORAL_TABLET | Freq: Once | ORAL | Status: AC
  Administered 2021-07-05: 25 mg via ORAL
  Filled 2021-07-05: qty 1

## 2021-07-05 NOTE — ED Notes (Addendum)
[facility] arrival to transport home. Daughter called with answer. Anticipating patient's arrival to answer door.

## 2021-07-06 DIAGNOSIS — I50810 Right heart failure, unspecified: Secondary | ICD-10-CM | POA: Diagnosis not present

## 2021-07-06 DIAGNOSIS — I130 Hypertensive heart and chronic kidney disease with heart failure and stage 1 through stage 4 chronic kidney disease, or unspecified chronic kidney disease: Secondary | ICD-10-CM | POA: Diagnosis not present

## 2021-07-06 DIAGNOSIS — I081 Rheumatic disorders of both mitral and tricuspid valves: Secondary | ICD-10-CM | POA: Diagnosis not present

## 2021-07-06 DIAGNOSIS — N1832 Chronic kidney disease, stage 3b: Secondary | ICD-10-CM | POA: Diagnosis not present

## 2021-07-06 DIAGNOSIS — E1122 Type 2 diabetes mellitus with diabetic chronic kidney disease: Secondary | ICD-10-CM | POA: Diagnosis not present

## 2021-07-06 DIAGNOSIS — I5033 Acute on chronic diastolic (congestive) heart failure: Secondary | ICD-10-CM | POA: Diagnosis not present

## 2021-07-08 DIAGNOSIS — E1122 Type 2 diabetes mellitus with diabetic chronic kidney disease: Secondary | ICD-10-CM | POA: Diagnosis not present

## 2021-07-08 DIAGNOSIS — I130 Hypertensive heart and chronic kidney disease with heart failure and stage 1 through stage 4 chronic kidney disease, or unspecified chronic kidney disease: Secondary | ICD-10-CM | POA: Diagnosis not present

## 2021-07-08 DIAGNOSIS — I50810 Right heart failure, unspecified: Secondary | ICD-10-CM | POA: Diagnosis not present

## 2021-07-08 DIAGNOSIS — I5033 Acute on chronic diastolic (congestive) heart failure: Secondary | ICD-10-CM | POA: Diagnosis not present

## 2021-07-08 DIAGNOSIS — N1832 Chronic kidney disease, stage 3b: Secondary | ICD-10-CM | POA: Diagnosis not present

## 2021-07-08 DIAGNOSIS — I081 Rheumatic disorders of both mitral and tricuspid valves: Secondary | ICD-10-CM | POA: Diagnosis not present

## 2021-07-11 DIAGNOSIS — N1832 Chronic kidney disease, stage 3b: Secondary | ICD-10-CM | POA: Diagnosis not present

## 2021-07-11 DIAGNOSIS — Z794 Long term (current) use of insulin: Secondary | ICD-10-CM | POA: Diagnosis not present

## 2021-07-11 DIAGNOSIS — E039 Hypothyroidism, unspecified: Secondary | ICD-10-CM | POA: Diagnosis not present

## 2021-07-11 DIAGNOSIS — E1122 Type 2 diabetes mellitus with diabetic chronic kidney disease: Secondary | ICD-10-CM | POA: Diagnosis not present

## 2021-07-12 DIAGNOSIS — I50810 Right heart failure, unspecified: Secondary | ICD-10-CM | POA: Diagnosis not present

## 2021-07-12 DIAGNOSIS — I5033 Acute on chronic diastolic (congestive) heart failure: Secondary | ICD-10-CM | POA: Diagnosis not present

## 2021-07-12 DIAGNOSIS — I081 Rheumatic disorders of both mitral and tricuspid valves: Secondary | ICD-10-CM | POA: Diagnosis not present

## 2021-07-12 DIAGNOSIS — I130 Hypertensive heart and chronic kidney disease with heart failure and stage 1 through stage 4 chronic kidney disease, or unspecified chronic kidney disease: Secondary | ICD-10-CM | POA: Diagnosis not present

## 2021-07-12 DIAGNOSIS — N1832 Chronic kidney disease, stage 3b: Secondary | ICD-10-CM | POA: Diagnosis not present

## 2021-07-12 DIAGNOSIS — E1122 Type 2 diabetes mellitus with diabetic chronic kidney disease: Secondary | ICD-10-CM | POA: Diagnosis not present

## 2021-07-14 ENCOUNTER — Encounter: Payer: Self-pay | Admitting: Internal Medicine

## 2021-07-14 ENCOUNTER — Telehealth (INDEPENDENT_AMBULATORY_CARE_PROVIDER_SITE_OTHER): Payer: Medicare Other | Admitting: Internal Medicine

## 2021-07-14 ENCOUNTER — Other Ambulatory Visit: Payer: Self-pay

## 2021-07-14 VITALS — BP 161/76 | Wt 202.0 lb

## 2021-07-14 DIAGNOSIS — I081 Rheumatic disorders of both mitral and tricuspid valves: Secondary | ICD-10-CM | POA: Diagnosis not present

## 2021-07-14 DIAGNOSIS — Z79899 Other long term (current) drug therapy: Secondary | ICD-10-CM

## 2021-07-14 DIAGNOSIS — I50810 Right heart failure, unspecified: Secondary | ICD-10-CM | POA: Diagnosis not present

## 2021-07-14 DIAGNOSIS — E1122 Type 2 diabetes mellitus with diabetic chronic kidney disease: Secondary | ICD-10-CM | POA: Diagnosis not present

## 2021-07-14 DIAGNOSIS — N1832 Chronic kidney disease, stage 3b: Secondary | ICD-10-CM | POA: Diagnosis not present

## 2021-07-14 DIAGNOSIS — I5033 Acute on chronic diastolic (congestive) heart failure: Secondary | ICD-10-CM

## 2021-07-14 DIAGNOSIS — I130 Hypertensive heart and chronic kidney disease with heart failure and stage 1 through stage 4 chronic kidney disease, or unspecified chronic kidney disease: Secondary | ICD-10-CM | POA: Diagnosis not present

## 2021-07-14 MED ORDER — TORSEMIDE 20 MG PO TABS: 20.0000 mg | ORAL_TABLET | Freq: Two times a day (BID) | ORAL | 3 refills | Status: DC

## 2021-07-14 NOTE — Progress Notes (Signed)
Virtual Visit via Video Note   This visit type was conducted due to national recommendations for restrictions regarding the COVID-19 Pandemic (e.g. social distancing) in an effort to limit this patient's exposure and mitigate transmission in our community.  Due to her co-morbid illnesses, this patient is at least at moderate risk for complications without adequate follow up.  This format is felt to be most appropriate for this patient at this time.  All issues noted in this document were discussed and addressed.  A limited physical exam was performed with this format.  Please refer to the patient's chart for her consent to telehealth for [facility].      Date:  07/14/2021   ID:  Margaret Dyer, DOB [birth year], MRN [id number] The patient was identified using 2 identifiers.  Evaluation Performed:  Follow-Up Visit  Patient Location:  [place] [id number]  Provider location:   [address] [place], [place] [address]  PCP:  Carollee Herter, Alferd Apa, DO  Cardiologist:  Pixie Casino, MD Electrophysiologist:  None   Chief Complaint:  Shortness of breath, leg swelling  History of Present Illness:    Margaret Dyer is a 86 y.o. female who presents via audio/video conferencing for a telehealth visit today.  This is a pleasant female that I followed for a number of years with a history of tobacco abuse, hypertension, dyslipidemia, cirrhosis, type 2 diabetes and lower extremity edema thought secondary to chronic diastolic heart failure.  Her last echo showed an LVEF of 60 to 65%.  She has had a persistently elevated BNP of around 219 with grade 2 diastolic dysfunction.  She had a right heart catheterization in 2018 which showed severe diastolic heart failure.  Dry weight has remained around 200 pounds.  Recently she was in the emergency department with shortness of breath and lower extremity edema.  BNP had elevated to 321 and she was noted to have some  basilar crackles and was given IV Lasix with good response.  She was told to remain on her home torsemide 20 mg daily.  She says he initially felt better after leaving the ER but over the past week has become more short of breath again.  When she takes off her oxygen she is noted to be hypoxic with oxygen saturations in the 70s and 80s.  Chest x-ray showed some probable chronic lung disease although she does not have a COPD diagnosis.  Also there was a suggestion of interstitial lung disease or possibly interstitial fluid.  She does have a pulmonary evaluation in September as a new patient.  The patient does not have symptoms concerning for COVID-19 infection (fever, chills, cough, or new SHORTNESS OF BREATH).    Prior CV studies:   The following studies were reviewed today:  Chart reviewed, lab work  PMHx:  Past Medical History:  Diagnosis Date   Asthma    Blood transfusion    Chronic diastolic CHF (congestive heart failure) (HCC)    CKD (chronic kidney disease), stage III ([place])    Coronary artery calcification seen on CT scan    Diabetes mellitus    Goiter    Hyperlipidemia    Hypertension    Hypothyroidism    Liver cirrhosis (HCC)    NASH (nonalcoholic steatohepatitis)    Obesity    Osteopenia    Pulmonary hypertension (HCC)    Renal cell carcinoma    a. prior nephrectomy.   Right heart failure ([place])  Thrombocytopenia ([place])     Past Surgical History:  Procedure Laterality Date   ABDOMINAL HYSTERECTOMY     APPENDECTOMY     HEMORRHOID SURGERY     INTRAMEDULLARY (IM) NAIL INTERTROCHANTERIC Left 12/25/2018   Procedure: INTRAMEDULLARY (IM) NAIL INTERTROCHANTRIC;  Surgeon: Nicholes Stairs, MD;  Location: [facility];  Service: Orthopedics;  Laterality: Left;   KNEE SURGERY     NEPHRECTOMY  09.2000    FAMHx:  Family History  Problem Relation Age of Onset   Cancer Sister        bladder   Kidney cancer Father    Cancer Father        renal   COPD Father    Congestive  Heart Failure Father    Coronary artery disease Other    Diabetes Other    Hyperlipidemia Other    Hypertension Other    Rheumatologic disease Neg Hx     SOCHx:   reports that she has quit smoking. She has never used smokeless tobacco. She reports that she does not drink alcohol and does not use drugs.  ALLERGIES:  Allergies  Allergen Reactions   Other Other (See Comments)    Patient has hemorrhaged at least 4 times in her lifetime   Cefuroxime Axetil Nausea Only    Upset stomach    Ciprofloxacin Nausea Only    Upset stomach    MEDS:  Current Meds  Medication Sig   albuterol (PROVENTIL) (2.5 MG/3ML) 0.083% nebulizer solution Take 3 mLs (2.5 mg total) by nebulization every 6 (six) hours as needed for wheezing or shortness of breath.   aspirin EC 81 MG EC tablet Take 1 tablet (81 mg total) by mouth daily.   docusate sodium (COLACE) 100 MG capsule Take 1 capsule (100 mg total) by mouth 2 (two) times daily.   ezetimibe (ZETIA) 10 MG tablet Take 1 tablet (10 mg total) by mouth daily.   hydrALAZINE (APRESOLINE) 25 MG tablet Take 1 tablet (25 mg total) by mouth every 8 (eight) hours.   insulin glargine (LANTUS) 100 UNIT/ML injection Inject 0.25 mLs (25 Units total) into the skin at bedtime.   Insulin Glulisine (APIDRA SOLOSTAR) 100 UNIT/ML Solostar Pen Inject 10-15 Units into the skin 3 (three) times daily before meals.    levothyroxine (SYNTHROID, LEVOTHROID) 25 MCG tablet take 1 tablet by mouth daily   metoprolol tartrate (LOPRESSOR) 25 MG tablet Take 1 tablet (25 mg total) by mouth 2 (two) times daily.   Multiple Vitamins-Minerals (CENTRUM SILVER ULTRA WOMENS PO) Take 1 tablet by mouth at bedtime.    oxybutynin (DITROPAN) 5 MG tablet TAKE 1 TABLET BY MOUTH DAILY   polyethylene glycol (MIRALAX / GLYCOLAX) 17 g packet Take 17 g by mouth daily as needed.   potassium chloride SA (KLOR-CON) 20 MEQ tablet TAKE 1 TABLET BY MOUTH DAILY AT BEDTIME   simvastatin (ZOCOR) 40 MG tablet Take 1  tablet (40 mg total) by mouth at bedtime.   sucralfate (CARAFATE) 1 g tablet Take 1 tablet (1 g total) by mouth 4 (four) times daily -  with meals and at bedtime. (Patient taking differently: Take 1 g by mouth daily as needed.)   traMADol (ULTRAM) 50 MG tablet Take 1 tablet (50 mg total) by mouth every 6 (six) hours as needed for moderate pain.   [DISCONTINUED] torsemide (DEMADEX) 20 MG tablet Take 1 tablet (20 mg total) by mouth daily.     ROS: Pertinent items noted in HPI and remainder of comprehensive ROS otherwise  negative.  Labs/Other Tests and Data Reviewed:    Recent Labs: 05/26/2021: TSH 0.243 06/09/2021: ALT 23 07/04/2021: B Natriuretic Peptide 321.1; BUN 37; Creatinine, Ser 1.31; Hemoglobin 10.0; Magnesium 2.0; Platelets PLATELET CLUMPS NOTED ON SMEAR, UNABLE TO ESTIMATE; Potassium 4.4; Sodium 136   Recent Lipid Panel Lab Results  Component Value Date/Time   CHOL 132 06/09/2021 03:11 PM   CHOL 122 11/08/2020 04:38 PM   TRIG 81.0 06/09/2021 03:11 PM   HDL 44.40 06/09/2021 03:11 PM   HDL 45 11/08/2020 04:38 PM   CHOLHDL 3 06/09/2021 03:11 PM   LDLCALC 72 06/09/2021 03:11 PM   LDLCALC 58 11/08/2020 04:38 PM   LDLDIRECT 89.0 01/19/2015 04:59 PM    Wt Readings from Last 3 Encounters:  07/14/21 202 lb (91.6 kg)  06/09/21 205 lb (93 kg)  05/29/21 200 lb 13.4 oz (91.1 kg)     Exam:    Vital Signs:  BP (!) 161/76   Wt 202 lb (91.6 kg)   LMP  (LMP Unknown)   BMI 35.22 kg/m    General appearance: alert, no distress, and on nasal cannula oxygen Lungs: No visual respiratory distress Abdomen: Moderately obese Extremities: Trace lower extremity edema Skin: Pale Neurologic: Grossly normal Psych: Pleasant  ASSESSMENT & PLAN:    Chronic diastolic congestive heart failure, LVEF 60-65% (05/2017, 05/2021), grade 2 DD Nonischemic Myoview with EF 71% (06/2017) Hypertension Cirrhosis Insulin-dependent diabetes mellitus Dyslipidemia Hypothyroidism  Ms. Schoenberg has had progressive  dyspnea and requiring more frequent oxygen.  She has noted some desaturations at home into the 70s and 80s off oxygen.  She had some improvement with diuresis in the ER but had not had an adjustment in her diuretics.  I advised her to increase her torsemide to 20 mg in the morning and in the afternoon.  She does have a scheduled follow-up appointment next week and I will see her in the office at that time to reexamine her.  We will also get labs including a metabolic profile and BNP at that time.  She had a recent echo in the hospital in June 2022 which demonstrated EF 60 to 65% with grade 2 diastolic dysfunction.  Creatinine actually improved somewhat.  There could be a significant pulmonary component and she may have some interstitial fibrosis.  Hopefully this will be better evaluated with pulmonary function testing in September.  COVID-19 Education: The signs and symptoms of COVID-19 were discussed with the patient and how to seek care for testing (follow up with PCP or arrange E-visit).  The importance of social distancing was discussed today.  Patient Risk:   After full review of this patients clinical status, I feel that they are at least moderate risk at this time.  Time:   Today, I have spent 25 minutes with the patient with telehealth technology discussing dyspnea, hypoxia, chest x-ray findings, ER evaluation, recent echocardiogram.     Medication Adjustments/Labs and Tests Ordered: Current medicines are reviewed at length with the patient today.  Concerns regarding medicines are outlined above.   Tests Ordered: Orders Placed This Encounter  Procedures   Basic metabolic panel   Brain natriuretic peptide    Medication Changes: Meds ordered this encounter  Medications   torsemide (DEMADEX) 20 MG tablet    Sig: Take 1 tablet (20 mg total) by mouth 2 (two) times daily.    Dispense:  180 tablet    Refill:  3    Disposition:  in 1 week(s)  Pixie Casino, MD, [facility],  [place] Director of the Advanced Lipid Disorders &  Cardiovascular Risk Reduction Clinic Diplomate of the American Board of Clinical Lipidology Attending Cardiologist  Direct Dial: [phone]  Fax: [phone]  Website:  [email or web address]  Pixie Casino, MD  07/14/2021 9:49 AM

## 2021-07-14 NOTE — Patient Instructions (Signed)
Medication Instructions:  INCREASE torsemide to 62m twice daily CONTINUE other current meds  *If you need a refill on your cardiac medications before your next appointment, please call your pharmacy*   Lab Work: BMET & BNP on 07/20/21 when in the office for appointment  If you have labs (blood work) drawn today and your tests are completely normal, you will receive your results only by: [place](if you have MyChart) OR A paper copy in the mail If you have any lab test that is abnormal or we need to change your treatment, we will call you to review the results.   Testing/Procedures: NONE   Follow-Up: [facility].  As part of our continuing mission to provide you with exceptional heart care, we have created designated Provider Care Teams.  These Care Teams include your primary Cardiologist (physician) and Advanced Practice Providers (APPs -  Physician Assistants and Nurse Practitioners) who all work together to provide you with the care you need, when you need it.  We recommend signing up for the patient portal called "MyChart".  Sign up information is provided on this After Visit Summary.  MyChart is used to connect with patients for Virtual Visits (Telemedicine).  Patients are able to view lab/test results, encounter notes, upcoming appointments, etc.  Non-urgent messages can be sent to your provider as well.   To learn more about what you can do with MyChart, go to [email or web address]    Your next appointment:   07/20/21 with Dr. HDebara Pickett

## 2021-07-18 ENCOUNTER — Other Ambulatory Visit: Payer: Self-pay | Admitting: Family Medicine

## 2021-07-18 DIAGNOSIS — F419 Anxiety disorder, unspecified: Secondary | ICD-10-CM

## 2021-07-18 MED ORDER — LORAZEPAM 0.5 MG PO TABS
0.5000 mg | ORAL_TABLET | Freq: Every day | ORAL | 1 refills | Status: DC | PRN
Start: 2021-07-18 — End: 2021-12-13

## 2021-07-18 NOTE — Telephone Encounter (Signed)
Pt called in stating that the oxygen tank she is carrying around has become a stressor and wants to see if she can get 36m ativan. She was previously on it and wants to see if its an option. [facility] if anything is sent in.

## 2021-07-19 ENCOUNTER — Telehealth: Payer: Self-pay | Admitting: Pharmacist

## 2021-07-19 NOTE — Telephone Encounter (Signed)
Pt aware and voices understanding.

## 2021-07-19 NOTE — Chronic Care Management (AMB) (Signed)
Chronic Care Management Pharmacy Assistant   Name: TAWNIE EHRESMAN  MRN: [id number] DOB: 1935-09-04  WILBURTA MILBOURN is an 86 y.o. year old female who presents for his initial CCM visit with the clinical pharmacist.  Recent office visits:  06/09/21-(PCP) Roma Schanz, DO. Seen for hospital follow up. Labs ordered. MB Referral to [facility] and Ambulatory referral to Nephrology. Follow up in 6 months.  Recent consult visits:  07/11/21-(Endocrinology)-Autumn Johnney Ou, PA-C. Follow up visit. Hypothyroidism: Adjust T4 to 63mg 1 tab Mon-Fri. Follow up in 6 weeks for labs. 04/08/21-(Cardiology) HAlmyra Deforest PA. Follow up visit. Start on amlodipine 5 mg twice a day for now. Ambulatory referral to ENT. Follow up in 1 month. 03/28/21-(Oncology) PRudell Cobb EMarin Olp MD. Initial visit. Follow up in 3 months.  Hospital visits:  Medication Reconciliation was completed by comparing discharge summary, patient's EMR and Pharmacy list, and upon discussion with patient.  Admitted to the hospital on 07/04/21 due to Shortness of breath. Discharge date was 07/05/21. [facility] None [facility]  Medication Changes at Hospital Discharge: -Changed None noted  Medications Discontinued at Hospital Discharge: -Stopped None noted  Medications that remain the same after Hospital Discharge:??  -All other medications will remain the same.     Medication Reconciliation was completed by comparing discharge summary, patient's EMR and Pharmacy list, and upon discussion with patient.  Admitted to the hospital on 05/25/21 due to Shortness of breath. Discharge date was 05/29/21. [facility] None noted  Medication Changes at Hospital Discharge: -Changed None noted  Medications Discontinued at Hospital  Discharge: -Stopped None noted  Medications that remain the same after Hospital Discharge:??  -All other medications will remain the same.     Medication Reconciliation was completed by comparing discharge summary, patient's EMR and Pharmacy list, and upon discussion with patient.  Admitted to the hospital on 03/29/21 due to Dizziness. Discharge date was 03/30/21. [facility] None noted  Medication Changes at Hospital Discharge: -Changed None noted  Medications Discontinued at Hospital Discharge: -Stopped None noted  Medications that remain the same after Hospital Discharge:??  -All other medications will remain the same.     Medication Reconciliation was completed by comparing discharge summary, patient's EMR and Pharmacy list, and upon discussion with patient.  Admitted to the hospital on 02/04/21 due to Dizziness. Discharge date was 02/04/21. [facility] None [facility]  Medication Changes at Hospital Discharge: -Changed None noted  Medications Discontinued at Hospital Discharge: -Stopped None noted  Medications that remain the same after Hospital Discharge:??  -All other medications will remain the same.     Medications: Outpatient Encounter Medications as of 07/19/2021  Medication Sig Note   albuterol (PROVENTIL) (2.5 MG/3ML) 0.083% nebulizer solution Take 3 mLs (2.5 mg total) by nebulization every 6 (six) hours as needed for wheezing or shortness of breath.    aspirin EC 81 MG EC tablet Take 1 tablet (81 mg total) by mouth daily.    docusate sodium (COLACE) 100 MG capsule Take 1 capsule (100 mg total) by mouth 2 (two) times daily.    ezetimibe (ZETIA) 10 MG tablet Take 1 tablet (10 mg total) by mouth daily.    hydrALAZINE (APRESOLINE)  25 MG tablet Take 1 tablet (25 mg total) by  mouth every 8 (eight) hours.    insulin glargine (LANTUS) 100 UNIT/ML injection Inject 0.25 mLs (25 Units total) into the skin at bedtime.    Insulin Glulisine (APIDRA SOLOSTAR) 100 UNIT/ML Solostar Pen Inject 10-15 Units into the skin 3 (three) times daily before meals.     levothyroxine (SYNTHROID, LEVOTHROID) 25 MCG tablet take 1 tablet by mouth daily 06/27/2021: Takes every day, but Sunday   LORazepam (ATIVAN) 0.5 MG tablet Take 1 tablet (0.5 mg total) by mouth daily as needed for anxiety.    metoprolol tartrate (LOPRESSOR) 25 MG tablet Take 1 tablet (25 mg total) by mouth 2 (two) times daily.    Multiple Vitamins-Minerals (CENTRUM SILVER ULTRA WOMENS PO) Take 1 tablet by mouth at bedtime.     oxybutynin (DITROPAN) 5 MG tablet TAKE 1 TABLET BY MOUTH DAILY    polyethylene glycol (MIRALAX / GLYCOLAX) 17 g packet Take 17 g by mouth daily as needed.    potassium chloride SA (KLOR-CON) 20 MEQ tablet TAKE 1 TABLET BY MOUTH DAILY AT BEDTIME    simvastatin (ZOCOR) 40 MG tablet Take 1 tablet (40 mg total) by mouth at bedtime.    sucralfate (CARAFATE) 1 g tablet Take 1 tablet (1 g total) by mouth 4 (four) times daily -  with meals and at bedtime. (Patient taking differently: Take 1 g by mouth daily as needed.)    torsemide (DEMADEX) 20 MG tablet Take 1 tablet (20 mg total) by mouth 2 (two) times daily.    traMADol (ULTRAM) 50 MG tablet Take 1 tablet (50 mg total) by mouth every 6 (six) hours as needed for moderate pain.    No facility-administered encounter medications on file as of 07/19/2021.   Albuterol (PROVENTIL) (2.5 MG/3ML) 0.083% nebulizer solution Last filled:None noted Aspirin EC 81 MG EC tablet Last filled:None noted Docusate sodium (COLACE) 100 MG capsule Last filled:None noted Ezetimibe (ZETIA) 10 MG tablet  Last filled:None noted HydrALAZINE (APRESOLINE) 25 MG tablet  Last filled:06/26/19 30 DS Insulin glargine (LANTUS) 100 UNIT/ML injection  Last filled:None noted Insulin Glulisine  (APIDRA SOLOSTAR) 100 UNIT/ML Solostar Pen  Last filled:None noted Levothyroxine (SYNTHROID, LEVOTHROID) 25 MCG tablet  Last filled:06/16/19 90 DS LORazepam (ATIVAN) 0.5 MG tablet  Last filled:None noted Metoprolol tartrate (LOPRESSOR) 25 MG tablet  Last filled:06/03/19 90 DS Oxybutynin (DITROPAN) 5 MG tablet  Last filled:06/26/19 90 DS Polyethylene glycol (MIRALAX / GLYCOLAX) 17 g packet  Last filled:None noted Potassium chloride SA (KLOR-CON) 20 MEQ tablet  Last filled:06/11/19 30 DS Simvastatin (ZOCOR) 40 MG tablet  Last filled:None noted Sucralfate (CARAFATE) 1 g tablet  Last filled:None noted Torsemide (DEMADEX) 20 MG tablet  Last filled:None noted TraMADol (ULTRAM) 50 MG tablet  Last filled:06/13/19 15 DS  Star Rating Drugs: Simvastatin (ZOCOR) 40 MG tablet Last filled: On hold patient has not picked up medication from pharmacy.  Corrie Mckusick, [place]

## 2021-07-20 ENCOUNTER — Other Ambulatory Visit: Payer: Self-pay

## 2021-07-20 ENCOUNTER — Telehealth: Payer: Medicare Other

## 2021-07-20 ENCOUNTER — Ambulatory Visit (INDEPENDENT_AMBULATORY_CARE_PROVIDER_SITE_OTHER): Payer: Medicare Other | Admitting: Internal Medicine

## 2021-07-20 VITALS — BP 153/66 | HR 60 | Ht 63.5 in | Wt 206.6 lb

## 2021-07-20 DIAGNOSIS — Z79899 Other long term (current) drug therapy: Secondary | ICD-10-CM | POA: Diagnosis not present

## 2021-07-20 DIAGNOSIS — I5032 Chronic diastolic (congestive) heart failure: Secondary | ICD-10-CM | POA: Diagnosis not present

## 2021-07-20 DIAGNOSIS — I10 Essential (primary) hypertension: Secondary | ICD-10-CM

## 2021-07-20 DIAGNOSIS — R0902 Hypoxemia: Secondary | ICD-10-CM

## 2021-07-20 MED ORDER — HYDRALAZINE HCL 50 MG PO TABS: 50.0000 mg | ORAL_TABLET | Freq: Three times a day (TID) | ORAL | 3 refills | Status: DC

## 2021-07-20 NOTE — Patient Instructions (Signed)
Medication Instructions:  Increase Hydralazine to 50 mg three times a day  *If you need a refill on your cardiac medications before your next appointment, please call your pharmacy*   Lab Work: Your physician recommends lab work today (BNP, CBC, CMP, Mg)  If you have labs (blood work) drawn today and your tests are completely normal, you will receive your results only by: [place] (if you have MyChart) OR A paper copy in the mail If you have any lab test that is abnormal or we need to change your treatment, we will call you to review the results.   Testing/Procedures: None ordered today   Follow-Up: At [facility], you and your health needs are our priority.  As part of our continuing mission to provide you with exceptional heart care, we have created designated Provider Care Teams.  These Care Teams include your primary Cardiologist (physician) and Advanced Practice Providers (APPs -  Physician Assistants and Nurse Practitioners) who all work together to provide you with the care you need, when you need it.  We recommend signing up for the patient portal called "MyChart".  Sign up information is provided on this After Visit Summary.  MyChart is used to connect with patients for Virtual Visits (Telemedicine).  Patients are able to view lab/test results, encounter notes, upcoming appointments, etc.  Non-urgent messages can be sent to your provider as well.   To learn more about what you can do with MyChart, go to [email or web address].    Your next appointment:   3 month(s)  The format for your next appointment:   In Person  Provider:   K. Mali Hilty, MD

## 2021-07-20 NOTE — Progress Notes (Addendum)
OFFICE NOTE  Chief Complaint:  Follow-up heart failure  Primary Care Physician: Carollee Herter, Alferd Apa, DO  HPI:  Margaret Dyer is a 86 y.o. female with a past medial history significant for tobacco abuse, hypertension, dyslipidemia, cirrhosis, diabetes type 2 and lower extremity edema. She presented the hospital in June 2018 with several weeks of dyspnea on exertion and 10-15 pound weight gain. She had bilateral pleural effusions and an elevated BNP of 219. Echo showed an EF of 60-65% with grade 2 diastolic dysfunction. She underwent right heart catheterization by Dr. Ellyn Hack on 05/18/2017 which showed severe diastolic heart failure and an elevated pulmonary capillary wedge pressure 32 mmHg. There is also felt to be component of pulmonary arterial hypertension. Ultimately she had a [employer] which showed no reversible ischemia. This was performed due to history of coronary calcifications. She also has chronic kidney disease and we preferred to avoid any contrast dye exposure such as with left heart catheterization. She was seen in follow-up by Sharrell Ku, PA-C in July who ordered her stress test and felt that she was doing well from cardiac standpoint. Her weight was 203 pounds. Today it's 200 pounds. She continues to take her diuretic and feels like she is doing well without any worsening shortness of breath.  05/03/2018  Margaret Dyer returns today for follow-up.  Overall she is doing well.  Her weight is significantly lower than it was during the hospital.  Now down about 8 to 10 pounds.  She has been getting some occasional dizziness.  This could be worse with change in position or bending over.  She has a history of neuropathy which can contribute to this and does have a new patient visit with neurology at the end of June.  She is also on twice daily Lasix and I wonder if she could be somewhat over diuresed.  She has no issues with edema.  She is also had some concerns about knots in her legs  which could be varicose veins.  There are signs of bilateral lower extremity venous insufficiency.  11/08/2020  Margaret Dyer is seen today in follow-up. She was last seen last summer by Doreene Adas, PA-C. Overall she seems to have been doing pretty well. Her blood pressure was not well controlled and she was started on lisinopril. This did cause her cough and ultimately she was switched over to losartan. She does have an issue with some degree of chronic kidney disease. She denies any worsening shortness of breath or edema. Blood pressure is again elevated today. She says she generally checks it at home perhaps on a weekly basis and gets around 696 systolic. She says she is not had any recent labs and they were last performed in 2020. She also says is difficult for her to get to her primary care provider's office would like lab work checked today. She is not fasting.  07/20/2021  Margaret Dyer returns today for follow-up.  I just saw her via virtual visit.  She was having worsening shortness of breath and hypoxemia.  Was felt that she was also having more swelling and I increased her torsemide to 20 mg twice a day.  She does not necessarily notice significant improvement in her breathing or reduction in her swelling or weight.  Her weight today actually is about similar to what it was a month ago.  She says her home weights have been fairly stable as well.  Blood pressure remains elevated today 153/66 however at home up  to about 161 systolic.  She reports some good response to hydralazine however only on the low-dose 3 times a day.  PMHx:  Past Medical History:  Diagnosis Date   Asthma    Blood transfusion    Chronic diastolic CHF (congestive heart failure) (HCC)    CKD (chronic kidney disease), stage III (HCC)    Coronary artery calcification seen on CT scan    Diabetes mellitus    Goiter    Hyperlipidemia    Hypertension    Hypothyroidism    Liver cirrhosis (HCC)    NASH (nonalcoholic  steatohepatitis)    Obesity    Osteopenia    Pulmonary hypertension (HCC)    Renal cell carcinoma    a. prior nephrectomy.   Right heart failure (HCC)    Thrombocytopenia ([place])     Past Surgical History:  Procedure Laterality Date   ABDOMINAL HYSTERECTOMY     APPENDECTOMY     HEMORRHOID SURGERY     INTRAMEDULLARY (IM) NAIL INTERTROCHANTERIC Left 12/25/2018   Procedure: INTRAMEDULLARY (IM) NAIL INTERTROCHANTRIC;  Surgeon: Nicholes Stairs, MD;  Location: [facility];  Service: Orthopedics;  Laterality: Left;   KNEE SURGERY     NEPHRECTOMY  09.2000    FAMHx:  Family History  Problem Relation Age of Onset   Cancer Sister        bladder   Kidney cancer Father    Cancer Father        renal   COPD Father    Congestive Heart Failure Father    Coronary artery disease Other    Diabetes Other    Hyperlipidemia Other    Hypertension Other    Rheumatologic disease Neg Hx     SOCHx:   reports that she has quit smoking. She has never used smokeless tobacco. She reports that she does not drink alcohol and does not use drugs.  ALLERGIES:  Allergies  Allergen Reactions   Other Other (See Comments)    Patient has hemorrhaged at least 4 times in her lifetime   Cefuroxime Axetil Nausea Only    Upset stomach    Ciprofloxacin Nausea Only    Upset stomach    ROS: Pertinent items noted in HPI and remainder of comprehensive ROS otherwise negative.  HOME MEDS: Current Outpatient Medications on File Prior to Visit  Medication Sig Dispense Refill   albuterol (PROVENTIL) (2.5 MG/3ML) 0.083% nebulizer solution Take 3 mLs (2.5 mg total) by nebulization every 6 (six) hours as needed for wheezing or shortness of breath. 75 mL 12   aspirin EC 81 MG EC tablet Take 1 tablet (81 mg total) by mouth daily.     docusate sodium (COLACE) 100 MG capsule Take 1 capsule (100 mg total) by mouth 2 (two) times daily. 10 capsule 0   ezetimibe (ZETIA) 10 MG tablet Take 1 tablet (10 mg total) by mouth  daily. 90 tablet 3   insulin glargine (LANTUS) 100 UNIT/ML injection Inject 0.25 mLs (25 Units total) into the skin at bedtime.     Insulin Glulisine (APIDRA SOLOSTAR) 100 UNIT/ML Solostar Pen Inject 10-15 Units into the skin 3 (three) times daily before meals.      levothyroxine (SYNTHROID, LEVOTHROID) 25 MCG tablet take 1 tablet by mouth daily 30 tablet 5   LORazepam (ATIVAN) 0.5 MG tablet Take 1 tablet (0.5 mg total) by mouth daily as needed for anxiety. 30 tablet 1   metoprolol tartrate (LOPRESSOR) 25 MG tablet Take 1 tablet (25 mg total) by mouth  2 (two) times daily. 180 tablet 3   Multiple Vitamins-Minerals (CENTRUM SILVER ULTRA WOMENS PO) Take 1 tablet by mouth at bedtime.      oxybutynin (DITROPAN) 5 MG tablet TAKE 1 TABLET BY MOUTH DAILY 90 tablet 1   polyethylene glycol (MIRALAX / GLYCOLAX) 17 g packet Take 17 g by mouth daily as needed.     potassium chloride SA (KLOR-CON) 20 MEQ tablet TAKE 1 TABLET BY MOUTH DAILY AT BEDTIME 30 tablet 11   simvastatin (ZOCOR) 40 MG tablet Take 1 tablet (40 mg total) by mouth at bedtime. 90 tablet 3   sucralfate (CARAFATE) 1 g tablet Take 1 tablet (1 g total) by mouth 4 (four) times daily -  with meals and at bedtime. (Patient taking differently: Take 1 g by mouth daily as needed.) 90 tablet 0   torsemide (DEMADEX) 20 MG tablet Take 1 tablet (20 mg total) by mouth 2 (two) times daily. 180 tablet 3   traMADol (ULTRAM) 50 MG tablet Take 1 tablet (50 mg total) by mouth every 6 (six) hours as needed for moderate pain. 60 tablet 1   No current facility-administered medications on file prior to visit.    LABS/IMAGING: No results found for this or any previous visit (from the past 48 hour(s)). No results found.  LIPID PANEL:    Component Value Date/Time   CHOL 132 06/09/2021 1511   CHOL 122 11/08/2020 1638   TRIG 81.0 06/09/2021 1511   HDL 44.40 06/09/2021 1511   HDL 45 11/08/2020 1638   CHOLHDL 3 06/09/2021 1511   VLDL 16.2 06/09/2021 1511    LDLCALC 72 06/09/2021 1511   LDLCALC 58 11/08/2020 1638   LDLDIRECT 89.0 01/19/2015 1659     WEIGHTS: Wt Readings from Last 3 Encounters:  07/20/21 206 lb 9.6 oz (93.7 kg)  07/14/21 202 lb (91.6 kg)  06/09/21 205 lb (93 kg)    VITALS: BP (!) 153/66   Pulse 60   Ht 5' 3.5" (1.613 m)   Wt 206 lb 9.6 oz (93.7 kg)   LMP  (LMP Unknown)   SpO2 97%   BMI 36.02 kg/m   EXAM: General appearance: alert, no distress and moderately obese Neck: no carotid bruit, no JVD and thyroid not enlarged, symmetric, no tenderness/mass/nodules Lungs: diminished breath sounds bilaterally Heart: regular rate and rhythm Abdomen: soft, non-tender; bowel sounds normal; no masses,  no organomegaly Extremities: extremities normal, atraumatic, no cyanosis or edema Pulses: 2+ and symmetric Skin: Skin color, texture, turgor normal. No rashes or lesions Neurologic: Grossly normal Psych: Pleasant  EKG: Deferred  ASSESSMENT: Chronic diastolic congestive heart failure, LVEF 60-65% (05/2017) Nonischemic Myoview with EF 71% (06/2017) Hypertension Cirrhosis Insulin-dependent diabetes mellitus Dyslipidemia Hypothyroidism  PLAN: 1.   Margaret Dyer has had persistent oxygen requirement and some edema.  I increased her diuretic further but it does not seem to have made a significant improvement with regards to her dyspnea or weight.  I would like to get labs today including CBC, metabolic, magnesium and BNP to assess the effects of her torsemide increase.  If she appears to be well compensated or overcompensated I may need to adjust the medications.  At this point I suspect there is a significant pulmonary component.  She is scheduled to see pulmonary in September.  Finally, blood pressure remains elevated today and poorly controlled.  We will plan to increase her hydralazine to 50 mg 3 times daily.  Follow-up with me in 3 months.  Pixie Casino, MD, [facility],  [place] Director of  the [facility] Diplomate of the American Board of Clinical Lipidology Attending Cardiologist  Direct Dial: [phone]  Fax: [phone]  Website:  [email or web address] 07/20/2021, 4:32 PM

## 2021-07-21 LAB — CBC
Hematocrit: 31.1 % — ABNORMAL LOW (ref 34.0–46.6)
Hemoglobin: 10 g/dL — ABNORMAL LOW (ref 11.1–15.9)
MCH: 29.8 pg (ref 26.6–33.0)
MCHC: 32.2 g/dL (ref 31.5–35.7)
MCV: 93 fL (ref 79–97)
Platelets: 104 10*3/uL — ABNORMAL LOW (ref 150–450)
RBC: 3.36 x10E6/uL — ABNORMAL LOW (ref 3.77–5.28)
RDW: 13.2 % (ref 11.7–15.4)
WBC: 5.9 10*3/uL (ref 3.4–10.8)

## 2021-07-21 LAB — BRAIN NATRIURETIC PEPTIDE: BNP: 172 pg/mL — ABNORMAL HIGH (ref 0.0–100.0)

## 2021-07-21 LAB — COMPREHENSIVE METABOLIC PANEL
ALT: 21 IU/L (ref 0–32)
AST: 24 IU/L (ref 0–40)
Albumin/Globulin Ratio: 1.7 (ref 1.2–2.2)
Albumin: 4.1 g/dL (ref 3.6–4.6)
Alkaline Phosphatase: 63 IU/L (ref 44–121)
BUN/Creatinine Ratio: 22 (ref 12–28)
BUN: 32 mg/dL — ABNORMAL HIGH (ref 8–27)
Bilirubin Total: 0.4 mg/dL (ref 0.0–1.2)
CO2: 29 mmol/L (ref 20–29)
Calcium: 9.3 mg/dL (ref 8.7–10.3)
Chloride: 98 mmol/L (ref 96–106)
Creatinine, Ser: 1.46 mg/dL — ABNORMAL HIGH (ref 0.57–1.00)
Globulin, Total: 2.4 g/dL (ref 1.5–4.5)
Glucose: 266 mg/dL — ABNORMAL HIGH (ref 65–99)
Potassium: 4.5 mmol/L (ref 3.5–5.2)
Sodium: 138 mmol/L (ref 134–144)
Total Protein: 6.5 g/dL (ref 6.0–8.5)
eGFR: 35 mL/min/{1.73_m2} — ABNORMAL LOW (ref >59)

## 2021-07-21 LAB — MAGNESIUM: Magnesium: 2.3 mg/dL (ref 1.6–2.3)

## 2021-07-22 ENCOUNTER — Telehealth: Payer: Self-pay | Admitting: Family Medicine

## 2021-07-22 DIAGNOSIS — I081 Rheumatic disorders of both mitral and tricuspid valves: Secondary | ICD-10-CM | POA: Diagnosis not present

## 2021-07-22 DIAGNOSIS — I130 Hypertensive heart and chronic kidney disease with heart failure and stage 1 through stage 4 chronic kidney disease, or unspecified chronic kidney disease: Secondary | ICD-10-CM | POA: Diagnosis not present

## 2021-07-22 DIAGNOSIS — E1122 Type 2 diabetes mellitus with diabetic chronic kidney disease: Secondary | ICD-10-CM | POA: Diagnosis not present

## 2021-07-22 DIAGNOSIS — I5033 Acute on chronic diastolic (congestive) heart failure: Secondary | ICD-10-CM | POA: Diagnosis not present

## 2021-07-22 DIAGNOSIS — I50810 Right heart failure, unspecified: Secondary | ICD-10-CM | POA: Diagnosis not present

## 2021-07-22 DIAGNOSIS — N1832 Chronic kidney disease, stage 3b: Secondary | ICD-10-CM | POA: Diagnosis not present

## 2021-07-22 NOTE — Telephone Encounter (Signed)
Caller/Agency: Dianna Rossetti Number: [phone]  Requesting OT/PT/Skilled Nursing/Social Work/Speech Therapy: requesting permisson on face mask  Has not followup  with Pulmonary. Appt is not until Sept.    Daughter and patient is concerned with night breathing  and wants to know if its okay if she is okay with sleeping with an over the counter mask instead.

## 2021-07-22 NOTE — Telephone Encounter (Signed)
See below:   Daughter and patient is concerned with night breathing  and wants to know if its okay if she is okay with sleeping with an over the counter mask instead.

## 2021-07-25 ENCOUNTER — Other Ambulatory Visit: Payer: Self-pay | Admitting: *Deleted

## 2021-07-25 ENCOUNTER — Encounter: Payer: Self-pay | Admitting: Internal Medicine

## 2021-07-25 MED ORDER — TORSEMIDE 20 MG PO TABS: ORAL_TABLET | ORAL | 3 refills | Status: DC

## 2021-07-26 ENCOUNTER — Ambulatory Visit (INDEPENDENT_AMBULATORY_CARE_PROVIDER_SITE_OTHER): Payer: Medicare Other | Admitting: Pharmacist

## 2021-07-26 ENCOUNTER — Telehealth: Payer: Medicare Other

## 2021-07-26 ENCOUNTER — Telehealth: Payer: Self-pay | Admitting: Pharmacist

## 2021-07-26 DIAGNOSIS — Z794 Long term (current) use of insulin: Secondary | ICD-10-CM

## 2021-07-26 DIAGNOSIS — E119 Type 2 diabetes mellitus without complications: Secondary | ICD-10-CM

## 2021-07-26 DIAGNOSIS — I509 Heart failure, unspecified: Secondary | ICD-10-CM

## 2021-07-26 DIAGNOSIS — I10 Essential (primary) hypertension: Secondary | ICD-10-CM

## 2021-07-26 DIAGNOSIS — E785 Hyperlipidemia, unspecified: Secondary | ICD-10-CM

## 2021-07-26 NOTE — Telephone Encounter (Signed)
Initial CCM visit was originally scheduled for 07/20/2021 at 5:79 but it conflicted with another appointment so was rescheduled for today 07/26/2021 at 3:30.  Tried to call patient twice but no answer. LM on VM.  Will continue to try to out reach patient.

## 2021-07-27 DIAGNOSIS — I5033 Acute on chronic diastolic (congestive) heart failure: Secondary | ICD-10-CM | POA: Diagnosis not present

## 2021-07-27 DIAGNOSIS — I50810 Right heart failure, unspecified: Secondary | ICD-10-CM | POA: Diagnosis not present

## 2021-07-27 DIAGNOSIS — N1832 Chronic kidney disease, stage 3b: Secondary | ICD-10-CM | POA: Diagnosis not present

## 2021-07-27 DIAGNOSIS — E1122 Type 2 diabetes mellitus with diabetic chronic kidney disease: Secondary | ICD-10-CM | POA: Diagnosis not present

## 2021-07-27 DIAGNOSIS — I081 Rheumatic disorders of both mitral and tricuspid valves: Secondary | ICD-10-CM | POA: Diagnosis not present

## 2021-07-27 DIAGNOSIS — I130 Hypertensive heart and chronic kidney disease with heart failure and stage 1 through stage 4 chronic kidney disease, or unspecified chronic kidney disease: Secondary | ICD-10-CM | POA: Diagnosis not present

## 2021-07-27 NOTE — Telephone Encounter (Signed)
Patient called back at 4pm 07/26/2021 - completed CCM pharmacy visit.

## 2021-07-27 NOTE — Telephone Encounter (Signed)
Spoke with HH and gave verbal for face mask instead of nasal cannula and re certification

## 2021-07-28 ENCOUNTER — Ambulatory Visit: Payer: Medicare Other

## 2021-07-28 DIAGNOSIS — Z794 Long term (current) use of insulin: Secondary | ICD-10-CM

## 2021-07-28 DIAGNOSIS — I509 Heart failure, unspecified: Secondary | ICD-10-CM

## 2021-07-28 DIAGNOSIS — E119 Type 2 diabetes mellitus without complications: Secondary | ICD-10-CM

## 2021-07-28 DIAGNOSIS — I10 Essential (primary) hypertension: Secondary | ICD-10-CM

## 2021-07-28 NOTE — Progress Notes (Signed)
Encounter details: CCM Time Spent       Value Time User   Time spent with patient (minutes)  90 07/28/2021  9:09 AM Cherre Robins, PharmD   Time spent performing Chart review  30 07/28/2021  9:09 AM Cherre Robins, PharmD   Total time (minutes)  120 07/28/2021  9:09 AM Cherre Robins, PharmD      Moderate to High Complex Decision Making       Value Time User   Moderate to High complex decision making  Yes 07/28/2021  9:09 AM Cherre Robins, PharmD      CCM Services: This encounter meets complex CCM services and moderate to high decision making.  Prior to outreach and patient consent for Chronic Care Management, I referred this patient for services after reviewing the nominated patient list or from a personal encounter with the patient.  I have personally reviewed this encounter including the documentation in this note and have collaborated with the care management provider regarding care management and care coordination activities to include development and update of the comprehensive care plan. I am certifying that I agree with the content of this note and encounter as supervising physician.

## 2021-07-28 NOTE — Chronic Care Management (AMB) (Signed)
Chronic Care Management Pharmacy Note  07/28/2021 Name:  Margaret Dyer MRN:  [id number] DOB:  1935-06-05  Summary: Does not have Part D coverage CHF improving - monitored by Dr Debara Pickett LDL slightly above goal - taking simvastatin and ezetimibe Diabetes - controlled - monitored by [facility] endo.  Recommendations/Changes made from today's visit: Sent information about Part D plans and [place] [facility] for patient to review to help with medication costs.  Reviewed s/s of worsening CHF and when to contact provider. Continue to follow up with Dr Debara Pickett Will continue to monitor adherence to statin, ezetimibe since last LDL was slightly increased.    Subjective: Margaret Dyer is an 86 y.o. year old female who is a primary patient of Ann Held, DO.  The CCM team was consulted for assistance with disease management and care coordination needs.    Engaged with patient by telephone for initial visit in response to provider referral for pharmacy case management and/or care coordination services.   Consent to Services:  The patient was given the following information about Chronic Care Management services today, agreed to services, and gave verbal consent: 1. CCM service includes personalized support from designated clinical staff supervised by the primary care provider, including individualized plan of care and coordination with other care providers 2. 24/7 contact phone numbers for assistance for urgent and routine care needs. 3. Service will only be billed when office clinical staff spend 20 minutes or more in a month to coordinate care. 4. Only one practitioner may furnish and bill the service in a calendar month. 5.The patient may stop CCM services at any time (effective at the end of the month) by phone call to the office staff. 6. The patient will be responsible for cost sharing (co-pay) of up to 20% of the service fee (after annual deductible is met). Patient agreed to services and  consent obtained.  Patient Care Team: Carollee Herter, Alferd Apa, DO as PCP - General Debara Pickett Nadean Corwin, MD as PCP - Cardiology (Cardiology) Riccardo Dubin, PA-C as Diabetes Educator (Internal Medicine) Luretha Rued, RN as Case Manager Cherre Robins, PharmD (Pharmacist)  Recent office visits: 06/09/21-PCP (Dr Carollee Herter) Seen for hospital follow up. Labs ordered. Referral to [facility] and Nephrology. Follow up in 6 months.  Recent consult visits: 07/20/2021 - Cardio (Dr Debara Pickett) F/U edema and SOB. Incresaed torsemide to 57m qam and 292mmidday; increaesed hydralazine to 50101mvery 8 hours 07/11/21-Endocrinology (Autumn JonRonnald RampA-C.) Follow up visit. Hypothyroidism: Adjust T4 to 6m82m tab Mon-Fri. Follow up in 6 weeks for labs. 04/08/21-[place]. Start on amlodipine 5 mg twice a day for now. Ambulatory referral to ENT. Follow up in 1 month. 03/28/21-Oncology (Dr EnneMarin Olpitial visit. Follow up in 3 months  Hospital visits 07/04/21 to 07/05/2021 [facility] to Shortness of breath.    [facility] -started None noted Medication Changes at Hospital Discharge: -Changed None noted Medications Discontinued at Hospital Discharge: -Stopped None noted Medications that remain the same after Hospital Discharge:??  -All other medications will remain the same.       05/25/21 to 05/29/2021 [facility] Shortness [facility] breath. [facility] -torsemide 20mg50mly Medication Changes at Hospital Discharge: -Changed None note Medications Discontinued at Hospital Discharge: -amlodipine, furosemide, losartan, spironolactone, Bactrim DS Medications that remain the same after Hospital Discharge:??  -All  other medications will remain the same.      03/29/21 to 03/30/21 ED at [facility] for  dizziness New?Medications Started at [facility] Discharge:?? -started None noted Medication Changes at Hospital Discharge: -Changed None noted Medications Discontinued at Hospital Discharge: -Stopped None noted Medications that remain the same after Hospital Discharge:??  -All other medications will remain the same.       02/04/21 ED at [facility] for Dizziness. Given metoclopramide 45m injection, Benadryl 12.515minjections in ED. No med changes at discharge [facility] -started None noted Medication Changes at Hospital Discharge: -Changed None noted Medications Discontinued at Hospital Discharge: -Stopped None noted Medications that remain the same after Hospital Discharge:??  -All other medications will remain the same.    Objective:  Lab Results  Component Value Date   CREATININE 1.46 (H) 07/20/2021   CREATININE 1.31 (H) 07/04/2021   CREATININE 1.30 (H) 06/09/2021    Lab Results  Component Value Date   HGBA1C 6.3 (H) 05/25/2021   Last diabetic Eye exam: No results found for: HMDIABEYEEXA  Last diabetic Foot exam: No results found for: HMDIABFOOTEX      Component Value Date/Time   CHOL 132 06/09/2021 1511   CHOL 122 11/08/2020 1638   TRIG 81.0 06/09/2021 1511   HDL 44.40 06/09/2021 1511   HDL 45 11/08/2020 1638   CHOLHDL 3 06/09/2021 1511   VLDL 16.2 06/09/2021 1511   LDLCALC 72 06/09/2021 1511   LDLCALC 58 11/08/2020 1638   LDLDIRECT 89.0 01/19/2015 1659    Hepatic Function Latest Ref Rng & Units 07/20/2021 06/09/2021 05/29/2021  Total Protein 6.0 - 8.5 g/dL 6.5 6.5 -  Albumin 3.6 - 4.6 g/dL 4.1 4.0 3.2(L)  AST 0 - 40 IU/L 24 21 -  ALT 0 - 32 IU/L 21 23 -  Alk Phosphatase 44 - 121 IU/L 63 49 -  Total Bilirubin 0.0 - 1.2 mg/dL 0.4 0.6 -  Bilirubin, Direct 0.1 - 0.5 mg/dL - - -    Lab Results  Component Value Date/Time   TSH 0.243 (L) 05/26/2021 04:45 AM   TSH 0.616 11/08/2020 04:38 PM   TSH 1.197  07/04/2019 04:28 AM   TSH 0.559 05/08/2014 03:35 PM   TSH 0.401 03/29/2012 03:34 PM    CBC Latest Ref Rng & Units 07/20/2021 07/04/2021 06/09/2021  WBC 3.4 - 10.8 x10E3/uL 5.9 6.9 6.7  Hemoglobin 11.1 - 15.9 g/dL 10.0(L) 10.0(L) 11.2(L)  Hematocrit 34.0 - 46.6 % 31.1(L) 32.1(L) 32.7(L)  Platelets 150 - 450 x10E3/uL 104(L) PLATELET CLUMPS NOTED ON SMEAR, UNABLE TO ESTIMATE 105.0(L)    Lab Results  Component Value Date/Time   VD25OH 21 (L) 05/08/2014 03:36 PM    Clinical ASCVD: Yes  The ASCVD Risk score (GMikey BussingC Jr., et al., 2013) failed to calculate for the following reasons:   The 2013 ASCVD risk score is only valid for ages 4060o 7936   Social History   Tobacco Use  Smoking Status Former  Smokeless Tobacco Never  Tobacco Comments   05/30/18 none in 40 years   BP Readings from Last 3 Encounters:  07/20/21 (!) 153/66  07/14/21 (!) 161/76  07/05/21 (!) 142/62   Pulse Readings from Last 3 Encounters:  07/20/21 60  07/05/21 (!) 55  06/09/21 66   Wt Readings from Last 3 Encounters:  07/20/21 206 lb 9.6 oz (93.7 kg)  07/14/21 202 lb (91.6 kg)  06/09/21 205 lb (93 kg)    Assessment:  Review of patient past medical history, allergies, medications, health status, including review of consultants reports, laboratory and other test data, was performed as part of comprehensive evaluation and provision of chronic care management services.   SDOH:  (Social Determinants of Health) assessments and interventions performed:  SDOH Interventions    Flowsheet Row Most Recent Value  SDOH Interventions   Financial Strain Interventions Other (Comment)  [Patient is getting insulin from patient assistance program. All other medications are generic but will check on resources for assistance. Patient has Medicare Part A and B and Supplement insurance but no Part D coverage - states she cannot afford.]       CCM Care Plan  Allergies  Allergen Reactions   Other Other (See Comments)     Patient has hemorrhaged at least 4 times in her lifetime   Cefuroxime Axetil Nausea Only    Upset stomach    Ciprofloxacin Nausea Only    Upset stomach    Medications Reviewed Today     Reviewed by Cherre Robins, PharmD (Pharmacist) on 07/27/21 at 1337  Med List Status: <None>   Medication Order Taking? Sig Documenting Provider Last Dose Status Informant  albuterol (PROVENTIL) (2.5 MG/3ML) 0.083% nebulizer solution [id number] Yes Take 3 mLs (2.5 mg total) by nebulization every 6 (six) hours as needed for wheezing or shortness of breath. Ann Held, DO Taking Active   aspirin EC 81 MG EC tablet [id number] Yes Take 1 tablet (81 mg total) by mouth daily. Geradine Girt, DO Taking Active Self  docusate sodium (COLACE) 100 MG capsule [id number] Yes Take 1 capsule (100 mg total) by mouth 2 (two) times daily. Bonnell Public, MD Taking Active Self  ezetimibe (ZETIA) 10 MG tablet [id number] Yes Take 1 tablet (10 mg total) by mouth daily. Ann Held, DO Taking Active   hydrALAZINE (APRESOLINE) 50 MG tablet [id number] Yes Take 1 tablet (50 mg total) by mouth every 8 (eight) hours. Pixie Casino, MD Taking Active   insulin glargine (LANTUS) 100 UNIT/ML injection [id number] Yes Inject 0.25 mLs (25 Units total) into the skin at bedtime. Geradine Girt, DO Taking Active Self  Insulin Glulisine (APIDRA SOLOSTAR) 100 UNIT/ML Solostar Pen [id number] Yes Inject 10-15 Units into the skin 3 (three) times daily before meals.  [provider] Taking Active Self  levothyroxine (SYNTHROID, LEVOTHROID) 25 MCG tablet [id number] Yes take 1 tablet by mouth daily  Patient taking differently: Take 25 mcg by mouth. Daily except none on Saturdays and Sundays   Carollee Herter, Alferd Apa, DO Taking Active            Med Note Richardson Landry, Linton Rump   Wed Jul 20, 2021  3:28 PM)    LORazepam (ATIVAN) 0.5 MG tablet [id number] Yes Take 1 tablet (0.5 mg total) by mouth daily as needed for anxiety. Roma Schanz R, DO Taking Active   metoprolol tartrate (LOPRESSOR) 25 MG tablet [id number] Yes Take 1 tablet (25 mg total) by mouth 2 (two) times daily. Pixie Casino, MD Taking Active Self  Multiple Vitamins-Minerals (CENTRUM SILVER ULTRA WOMENS PO) [id number] Yes Take 1 tablet by mouth at bedtime.  [provider] Taking Active Self  oxybutynin (DITROPAN) 5 MG tablet [id number] Yes TAKE 1 TABLET BY MOUTH DAILY Carollee Herter, Alferd Apa, DO Taking Active Self  polyethylene glycol (MIRALAX / GLYCOLAX) 17 g packet [id number] Yes Take 17 g by mouth daily as needed. [provider] Taking Active Self  potassium chloride  SA (KLOR-CON) 20 MEQ tablet [id number] Yes TAKE 1 TABLET BY MOUTH DAILY AT BEDTIME Hilty, Nadean Corwin, MD Taking Active Self  simvastatin (ZOCOR) 40 MG tablet [id number] Yes Take 1 tablet (40 mg total) by mouth at bedtime. Ann Held, DO Taking Active   sucralfate (CARAFATE) 1 g tablet [id number] Yes Take 1 tablet (1 g total) by mouth 4 (four) times daily -  with meals and at bedtime.  Patient taking differently: Take 1 g by mouth daily as needed.   Ann Held, DO Taking Active   torsemide (DEMADEX) 20 MG tablet [id number] Yes Take 2 tablets (22m) by mouth in the morning and 1 tablet (241m in the afternoon. HiPixie CasinoMD Taking Active   traMADol (UVeatrice Bourbon50 MG tablet [id number] Take 1 tablet (50 mg total) by mouth every 6 (six) hours as needed for moderate pain. LoAnn HeldDO Taking Active             Patient Active Problem List   Diagnosis Date Noted   Uncontrolled type 2 diabetes mellitus with hyperglycemia (HCMilan07/06/2021   Chronic renal disease, stage 4, severely decreased glomerular filtration rate (GFR) between 15-29 mL/min/1.73 square meter (HCRice07/06/2021   CHF exacerbation (HCDubuque06/22/2022   Acute on chronic diastolic CHF (congestive heart failure) (HCNewport East06/22/2022   Closed fracture of maxillary sinus (HCCorrectionville 07/10/2019   UTI (urinary tract infection) 07/03/2019   Food poisoning 07/03/2019   Bacteremia 07/03/2019   Exposure to COVID-19 virus 06/16/2019   Closed left hip fracture (HCPort [place]ue01/21/2020   Dysuria 12/17/2017   Dizziness 12/17/2017   Community acquired pneumonia 12/17/2017   Pneumonia 12/01/2017   Urinary incontinence 11/09/2017   Tic 11/09/2017   Pulmonary hypertension (HCBenton Heights10/09/2017   Chronic diastolic CHF (congestive heart failure) (HCShenandoah Heights07/02/2017   Oral candida 05/29/2017   Vitamin D deficiency 05/29/2017   Chronic respiratory failure (HCSylvester06/21/2018   Anxiety 05/24/2017   HCAP (healthcare-associated pneumonia) 05/22/2017   Right heart failure (HCTyler Run0685/01/7740 Acute diastolic CHF (congestive heart failure) (HCC)    Exertional dyspnea 05/13/2017   Hypertension 05/13/2017   Asthma 05/13/2017   DOE (dyspnea on exertion) 05/13/2017   Diabetes mellitus type 2, insulin dependent (HCArthur06/09/2017   Acute kidney injury (HCMiddleport06/09/2017   History of nephrectomy 05/13/2017   Hyperlipidemia LDL goal <70 05/13/2017   Liver cirrhosis secondary to NASH (HCEast Fultonham06/09/2017   Thrombocytopenia (HCGassville06/09/2017   Renal cell carcinoma 05/13/2017   Hypothyroidism 05/13/2017   Hypersplenism 05/13/2017   Osteoarthritis 05/13/2017   Overactive bladder 05/13/2017   URI, acute 02/22/2017   Abrasion, left knee, initial encounter 02/22/2017   CTS (carpal tunnel syndrome) 03/04/2016   Bilateral contusion of ribs 10/05/2015   Dental abscess 04/15/2014   Claudication, intermittent (HCMiddlebourne01/01/2014   Obesity (BMI 30-39.9) 07/22/2013   Back pain 05/26/2013   Platelets decreased (HCPleasant Prairie06/23/2014   Breast pain, right 04/21/2013   Anxiety and depression 12/05/2012   Bronchitis 11/06/2012   HEMATURIA UNSPECIFIED 02/17/2011   Hyperlipidemia 09/08/2010   B12 DEFICIENCY 07/22/2010   DIZZINESS 07/04/2010   Essential hypertension 06/21/2010   GOUT, UNSPECIFIED 01/27/2010   NEPHRECTOMY, HX OF  12/02/2009   NECK PAIN, LEFT 09/04/2008   PARESTHESIA 09/02/2007   GOITER NOS 04/23/2007   Diabetes mellitus (HCLinda05/20/2008   Asthma 04/23/2007   Renal stone 04/23/2007   OSTEOPENIA 04/23/2007   CARCINOMA, RENAL CELL 08/10/1999    Immunization History  Administered Date(s)  Administered   Influenza Split 09/29/2011   Influenza Whole 09/04/1999, 09/20/2007, 09/04/2008, 01/18/2010, 09/19/2010   Influenza, High Dose Seasonal PF 01/05/2014, 09/23/2015, 10/24/2016, 11/09/2017   Influenza, Seasonal, Injecte, Preservative Fre 12/05/2012   Influenza,inj,Quad PF,6+ Mos 10/27/2014   Influenza-Unspecified 10/04/2018   Pneumococcal Conjugate-13 05/28/2017   Tdap 10/24/2016    Conditions to be addressed/monitored: CHF, HTN, HLD, DMII, Anxiety, and GERD, hypothyroidism; OAB  Care Plan : General Pharmacy (Adult)  Updates made by Cherre Robins, PHARMD since 07/28/2021 12:00 AM     Problem: Chronic conditions: type 2 DM, insulin required; hyperlipidemia; CHD, HTN; OAB; GERD; chronic pain   Priority: High  Onset Date: 07/26/2021     Long-Range Goal: Management of chronic conditions and medications   Start Date: 07/26/2021  Priority: High  Note:   Current Barriers:  Unable to independently afford treatment regimen Unable to maintain control of CHF Frequent visits to ED in the last 6 months  Pharmacist Clinical Goal(s):  Over the next 90 days, patient will maintain control of Type2 diabetes, HTN, CHF and hyperlipidemia as evidenced by attaining disease specific goals listed below  adhere to prescribed medication regimen as evidenced by medication refill records contact provider office for questions/concerns as evidenced notation of same in electronic health record Consider programs to help medication costs and assess possibility of signing up for a Medicare Part D plan  through collaboration with PharmD and provider.   Interventions: 1:1 collaboration with Carollee Herter, Alferd Apa, DO  regarding development and update of comprehensive plan of care as evidenced by provider attestation and co-signature Inter-disciplinary care team collaboration (see longitudinal plan of care) Comprehensive medication review performed; medication list updated in electronic medical record  Diabetes: Controlled; A1c goal < 7.0% Managed by Endocrinology Clinic with [facility] Current treatment: Lantus 25 units injected into skin once a day Apidra 10 to 15 units injected into skin prior to meals 3 times a day per sliding scale / blood glucose reading Current glucose readings: fasting glucose: 85 to 150, post prandial glucose: 100 to 190 Denies hypoglycemic/hyperglycemic symptoms Patient received insulin through patient assistance program from her endo office.  Interventions:  Discussed A1c goal Reviewed home blood glucose readings and reviewed goals  Fasting blood glucose goal (before meals) = 80 to 130 Blood glucose goal after a meal = less than 180  Continue current regimen for diabetes and to follow up with endocrinology office Continue to check blood glucose 3 to 4 times a day and record for future visits  Hypertension / CHF: Uncontrolled but improving; BP goal <140/90 CHF goal: decreased symptoms such as edema and shortness of breath and to prevent exacerbations / hospitalizations Followed by Dr Debara Pickett BP Readings from Last 3 Encounters:  07/20/21 (!) 153/66  07/14/21 (!) 161/76  07/05/21 (!) 142/62  Current treatment: Hydralazine 12m 3 times a day (increased by Dr HDebara Pickett08/17/2022) Metoprolol tartrate 263mtwice a day  Torsemide 2029m take 2 tablets = 34m56mch morning and 1 tablet = 20mg62mday (increased by Dr HiltyDebara Pickett2/2022) Current home readings: only has 1 reading 169/75 which was before above changes were made BNP was 321 (07/04/2021); Decreased to 172 (07/20/2021) Interventions:  Recommended checking blood pressure daily and record reading for future  visits Discussed signs and symptoms of CHF exacerbation - weight gain, SOB, abdominal fullness, swelling in legs or abdomen, Fatigue and weakness, changes in ability to perform usual activities, persistent cough or wheezing with white or pink blood-tinged mucus,  nausea and lack of appetite Continue to weigh daily - report weight gain of more than 3 lbs in 24 hours or 5 lbs in 1 week or signs listed above to provider Continue to follow up with Dr Debara Pickett  Medication Management:  Patient does not have a Medicare Part D plan. States that cost if too much and she has a lot of bills.  She current gets insulin from patient assistance program. All other medications she gets 30 days supply at [facility] and pays cash price for.  She does not drive and states that she prefers to keep getting her medications from [facility] because it is easy for her daughter to pick up for her.  She did mention she was getting Vytorin (simvastatin+ezetimibe) from patient assistance but [facility] no longer provides in their program since is now generic.  Reviewed refill history. Patient appears to be filling medications on time.  Interventions:  Evaluated for Low Income subsidy - does not qualify Through Medicare.gov site reviewed potential Medicare Part D plans and costs. Mailed information to patient about the 2 lowest cost plans.  Also discussed [place] [facility]ist program. She might qualify to get a few of her medications for free. Would be mailed to her. Patient is not sure she wants to get medication through mail. Sent her information about the program to review.   Patient Goals/Self-Care Activities Over the next 90 days, patient will:  take medications as prescribed, focus on medication adherence by refilling needed medications and using pill boxes, check glucose 3 to 4 times a day, document, and provide at future appointments, check blood pressure daily, document, and provide at future appointments, weigh  daily, and contact provider if weight gain of 3 pounds in 24 hours or 5 pounds in 1 week, and collaborate with provider on medication access solutions  Follow Up Plan: Telephone follow up appointment with care management team member scheduled for:  2 weeks . Has phone appointment with CCM nurse 07/28/2021.       Medication Assistance:  Getting Lantus and Apidra through ednocrinology office from patient assistance program.  All other medications are generic. Sent patient information on Medicare Part D plans and [place] [facility] Assist program  Patient's preferred pharmacy is:  [facility], [address] [place] [address] Phone: [phone] Fax: [phone]  [facility] [place], [address] [place] [address] Phone: [phone] Fax: [phone]  Uses pill box? No -   Pt endorses 95% compliance  Follow Up:  Patient agrees to Care Plan and Follow-up.  Plan: Telephone follow up appointment with care management team member scheduled for:  2 weeks  Cherre Robins, PharmD Clinical Pharmacist [facility]

## 2021-07-28 NOTE — Chronic Care Management (AMB) (Signed)
Chronic Care Management   CCM RN Visit Note  07/28/2021 Name: Margaret Dyer MRN: [id number] DOB: 1935/11/13  Subjective: Margaret Dyer is a 86 y.o. year old female who is a primary care patient of Ann Held, DO. The care management team was consulted for assistance with disease management and care coordination needs.    Engaged with patient by telephone for follow up visit in response to provider referral for case management and/or care coordination services.   Consent to Services:  The patient was given information about Chronic Care Management services, agreed to services, and gave verbal consent prior to initiation of services.  Please see initial visit note for detailed documentation.   Patient agreed to services and verbal consent obtained.   Assessment: Review of patient past medical history, allergies, medications, health status, including review of consultants reports, laboratory and other test data, was performed as part of comprehensive evaluation and provision of chronic care management services.   SDOH (Social Determinants of Health) assessments and interventions performed:    CCM Care Plan  Allergies  Allergen Reactions   Other Other (See Comments)    Patient has hemorrhaged at least 4 times in her lifetime   Cefuroxime Axetil Nausea Only    Upset stomach    Ciprofloxacin Nausea Only    Upset stomach    Outpatient Encounter Medications as of 07/28/2021  Medication Sig   albuterol (PROVENTIL) (2.5 MG/3ML) 0.083% nebulizer solution Take 3 mLs (2.5 mg total) by nebulization every 6 (six) hours as needed for wheezing or shortness of breath.   aspirin EC 81 MG EC tablet Take 1 tablet (81 mg total) by mouth daily.   docusate sodium (COLACE) 100 MG capsule Take 1 capsule (100 mg total) by mouth 2 (two) times daily.   Ezetimibe-Simvastatin (VYTORIN PO) Take 1 tablet by mouth daily.   hydrALAZINE (APRESOLINE) 50 MG tablet Take 1 tablet (50 mg total) by mouth  every 8 (eight) hours.   insulin glargine (LANTUS) 100 UNIT/ML injection Inject 0.25 mLs (25 Units total) into the skin at bedtime.   Insulin Glulisine (APIDRA SOLOSTAR) 100 UNIT/ML Solostar Pen Inject 10-15 Units into the skin 3 (three) times daily before meals.    levothyroxine (SYNTHROID, LEVOTHROID) 25 MCG tablet take 1 tablet by mouth daily (Patient taking differently: Take 25 mcg by mouth. Daily except none on Saturdays and Sundays)   LORazepam (ATIVAN) 0.5 MG tablet Take 1 tablet (0.5 mg total) by mouth daily as needed for anxiety.   metoprolol tartrate (LOPRESSOR) 25 MG tablet Take 1 tablet (25 mg total) by mouth 2 (two) times daily.   Multiple Vitamins-Minerals (CENTRUM SILVER ULTRA WOMENS PO) Take 1 tablet by mouth at bedtime.    oxybutynin (DITROPAN) 5 MG tablet TAKE 1 TABLET BY MOUTH DAILY   polyethylene glycol (MIRALAX / GLYCOLAX) 17 g packet Take 17 g by mouth daily as needed.   potassium chloride SA (KLOR-CON) 20 MEQ tablet TAKE 1 TABLET BY MOUTH DAILY AT BEDTIME   sucralfate (CARAFATE) 1 g tablet Take 1 tablet (1 g total) by mouth 4 (four) times daily -  with meals and at bedtime. (Patient taking differently: Take 1 g by mouth daily as needed.)   torsemide (DEMADEX) 20 MG tablet Take 2 tablets (3m) by mouth in the morning and 1 tablet (258m in the afternoon.   traMADol (ULTRAM) 50 MG tablet Take 1 tablet (50 mg total) by mouth every 6 (six) hours as needed for moderate pain.  ezetimibe (ZETIA) 10 MG tablet Take 1 tablet (10 mg total) by mouth daily. (Patient not taking: Reported on 07/28/2021)   simvastatin (ZOCOR) 40 MG tablet Take 1 tablet (40 mg total) by mouth at bedtime. (Patient not taking: Reported on 07/28/2021)   No facility-administered encounter medications on file as of 07/28/2021.    Patient Active Problem List   Diagnosis Date Noted   Uncontrolled type 2 diabetes mellitus with hyperglycemia ([place]) 06/09/2021   Chronic renal disease, stage 4, severely decreased  glomerular filtration rate (GFR) between 15-29 mL/min/1.73 square meter ([place]) 06/09/2021   CHF exacerbation ([place]) 05/25/2021   Acute on chronic diastolic CHF (congestive heart failure) ([place]) 05/25/2021   Closed fracture of maxillary sinus ([place]) 07/10/2019   UTI (urinary tract infection) 07/03/2019   Food poisoning 07/03/2019   Bacteremia 07/03/2019   Exposure to COVID-19 virus 06/16/2019   Closed left hip fracture ([place]) 12/24/2018   Dysuria 12/17/2017   Dizziness 12/17/2017   Community acquired pneumonia 12/17/2017   Pneumonia 12/01/2017   Urinary incontinence 11/09/2017   Tic 11/09/2017   Pulmonary hypertension ([place]) 09/12/2017   Chronic diastolic CHF (congestive heart failure) ([place]) 06/05/2017   Oral candida 05/29/2017   Vitamin D deficiency 05/29/2017   Chronic respiratory failure ([place]) 05/24/2017   Anxiety 05/24/2017   HCAP (healthcare-associated pneumonia) 05/22/2017   Right heart failure ([place]) 47/08/6282   Acute diastolic CHF (congestive heart failure) ([facility])    Exertional dyspnea 05/13/2017   Hypertension 05/13/2017   Asthma 05/13/2017   DOE (dyspnea on exertion) 05/13/2017   Diabetes mellitus type 2, insulin dependent ([place]) 05/13/2017   Acute kidney injury ([place]) 05/13/2017   History of nephrectomy 05/13/2017   Hyperlipidemia LDL goal <70 05/13/2017   Liver cirrhosis secondary to NASH ([place]) 05/13/2017   Thrombocytopenia ([place]) 05/13/2017   Renal cell carcinoma 05/13/2017   Hypothyroidism 05/13/2017   Hypersplenism 05/13/2017   Osteoarthritis 05/13/2017   Overactive bladder 05/13/2017   URI, acute 02/22/2017   Abrasion, left knee, initial encounter 02/22/2017   CTS (carpal tunnel syndrome) 03/04/2016   Bilateral contusion of ribs 10/05/2015   Dental abscess 04/15/2014   Claudication, intermittent ([place]) 12/05/2013   Obesity (BMI 30-39.9) 07/22/2013   Back pain 05/26/2013   Platelets decreased ([place]) 05/26/2013   Breast pain, right 04/21/2013   Anxiety and depression  12/05/2012   Bronchitis 11/06/2012   HEMATURIA UNSPECIFIED 02/17/2011   Hyperlipidemia 09/08/2010   B12 DEFICIENCY 07/22/2010   DIZZINESS 07/04/2010   Essential hypertension 06/21/2010   GOUT, UNSPECIFIED 01/27/2010   NEPHRECTOMY, HX OF 12/02/2009   NECK PAIN, LEFT 09/04/2008   PARESTHESIA 09/02/2007   GOITER NOS 04/23/2007   Diabetes mellitus ([place]) 04/23/2007   Asthma 04/23/2007   Renal stone 04/23/2007   OSTEOPENIA 04/23/2007   CARCINOMA, RENAL CELL 08/10/1999    Conditions to be addressed/monitored:CHF, HTN, HLD, and DMII  Care Plan : Cardiovascular disease  Updates made by Luretha Rued, RN since 07/28/2021 12:00 AM     Problem: lack of long term care plan for managment of cardiovasculare disease in a patient with CHF, HTN, DM, pulmonary HTN   Priority: Medium     Long-Range Goal: Symptom Exacerbation Prevented or Minimized   Start Date: 06/27/2021  Expected End Date: 12/28/2021  This Visit's Progress: On track  Priority: Medium  Note:   Current Barriers: recent admission 05/25/21-05/29/21 with congestive heart failure and AKI. ED visit for acute on chronic heart failure on 07/04/21. Patient with history of HTN, Pulmonary Hypertension with chronic respiratory  failure, chronic renal disease. Patient reports she is on oxygen at 3L/[place]. She has an appointment with pulmonologist on 08/15/21 and Nephrologist on 08/03/21. Ms. Fairbairn states she has been weighing self daily and states she has very little swelling. Weight today 200 pounds. She denies any signs/symptoms of heart failure exacerbation at this time. Blood pressure elevated noted per cardiologist on 07/20/21. Hydralazine increased to 50 mg tid per cardiologist. Patient reports she has been checking blood pressure and readings have been ranging 169-179/70-80. She states that her blood pressure is being managed by her cardiologist. She states she remains active with [facility]  with nurse making weekly visits.     Knowledge  deficit related to long term care plan for self-management of cardiac disease in a patient with CHF, HTN, AKI, Pulmonary HTN, renal cell carcinoma and liver cirrhosis. Transportation Barriers-does not have consistent transportation to her appointments. She states she has arranged transportation to nephrology appointment next week.  Case Manager Clinical Goal(s):  patient will weigh self daily and record patient will verbalize understanding of Heart Failure Action Plan and when to call doctor patient will take all cardiac mediations as prescribed Patient will check blood pressure as recommended by providers Interventions:  Collaboration with Carollee Herter, Alferd Apa, DO regarding development and update of comprehensive plan of care as evidenced by provider attestation and co-signature Inter-disciplinary care team collaboration (see longitudinal plan of care) Reinforced importance of low salt diet.  Discussed how to get most accurate reading when taking blood pressures Reviewed Heart Failure Action Plan Medications reviewed. RNCM reinforced importance of taking medications as prescribed. She states she is taking Vytorin until it runs out and that she has not picked up ezetimibe or simvastatin from the pharmacy yet.  Reviewed upcoming appointment with patient. Nephrologist 08/03/21 [facility]; pulmonologist 08/15/21. Cardiologist 10/31/21 Collaboration with clinical pharmacist regarding medications Reviewed transportation options provided by care guide with patient. Encouraged client to continue to check and record blood pressure readings and to follow up with cardiologist and/or primary care provider if it does not improve or worsens. Discussed plans with patient for ongoing care management follow up and provided patient with direct contact information for care management team Patient Goals: - develop a rescue plan and follow rescue plan if symptoms flare-up - eat more whole grains, fruits and  vegetables, lean meats and healthy fats, monitor salt intake - know when to call the doctor - track symptoms and what helps feel better or worse - weigh and record your weights daily. Call office if you gain more than 3 pounds in one day or 5 pounds in one week, have increased swelling in stomach, hands feet or legs, - continue to monitor and record blood pressure and call if your blood pressure is outside of recommended parameters - continue to take your medications as prescribed by your doctor - continue to attend provider visits as recommended/scheduled - Call your nurse care coordinator if you continue to have difficulty getting transportation to your appointments. - continue to work with the Care Management team for ongoing care coordination/disease management needs. Follow Up Plan: Telephone follow up appointment with care management team member scheduled for: next month The patient has been provided with contact information for the care management team and has been advised to call with any health related questions or concerns.      Care Plan : Diabetes Type 2 (Adult)  Updates made by Luretha Rued, RN since 07/28/2021 12:00 AM  Problem: Glycemic Management (Diabetes, Type 2)   Priority: Medium     Long-Range Goal: Glycemic Management Optimized   Start Date: 06/27/2021  Expected End Date: 12/28/2021  This Visit's Progress: On track  Priority: Medium  Note:   Objective:  Lab Results  Component Value Date   HGBA1C 6.3 (H) 05/25/2021   Lab Results  Component Value Date   CREATININE 1.46 (H) 07/20/2021   CREATININE 1.31 (H) 07/04/2021   CREATININE 1.30 (H) 06/09/2021  Current Barriers:  Knowledge Deficits related to long term care plan for self-management of Diabetes. Client reports she is checking blood sugars. Blood sugar today 117 this morning. Denies any signs/symptoms of hyperglycemia or hypoglycemia. She states she has her medications and denies any problems or  concerns at this time. Case Manager Clinical Goal(s):  patient will demonstrate improved adherence to prescribed treatment plan for diabetes self care/management as evidenced by: improved daily monitoring and recording of CBG  adherence to ADA/ carb modified diet adherence to prescribed medication regimen Interventions:  Collaboration with Carollee Herter, Alferd Apa, DO regarding development and update of comprehensive plan of care as evidenced by provider attestation and co-signature Inter-disciplinary care team collaboration (see longitudinal plan of care) Reviewed medications with patient and discussed importance of medication adherence. Reviewed scheduled/upcoming provider appointments. Collaboration with clinical pharmacist regarding medications. Collaboration with care guide as needed. RNCM encouraged patient to continue to check blood sugar and record as recommended.  Reiterated FBS goal 80-130; goal after meal < 130. Encouraged patient to continue to work with clinic pharmacist regarding medications. Discussed plans with patient for ongoing care management follow up and provided patient with direct contact information for care management team Patient Goals: - continue to check blood sugar at recommended times and check blood sugar if you feel it is too high or too low - take the blood sugar log and meter to all doctor visits - continue to take your medications as prescribed. - continue to attend provider visits as recommended Follow Up Plan: Telephone follow up appointment with care management team member scheduled for: next month   Plan:Telephone follow up appointment with care management team member scheduled for:  next month and The patient has been provided with contact information for the care management team and has been advised to call with any health related questions or concerns.   Thea Silversmith, RN, MSN, BSN, CCM Care Management Coordinator [facility] [phone]

## 2021-07-28 NOTE — Patient Instructions (Addendum)
Visit Information  PATIENT GOALS:  Goals Addressed             This Visit's Progress    Monitor and Manage My Blood Sugar-Diabetes Type 2   On track    Timeframe:  Long-Range Goal Priority:  Medium Start Date:  06/27/2021                           Expected End Date: 12/28/2021                      Follow Up Date 08/16/21   - continue to check blood sugar at recommended times and check blood sugar if you feel it is too high or too low - take the blood sugar log and meter to all doctor visits - continue to take your medications as prescribed. - continue to attend provider visits as recommended   Why is this important?   Checking your blood sugar at home helps to keep it from getting very high or very low.  Writing the results in a diary or log helps the doctor know how to care for you.  Your blood sugar log should have the time, date and the results.  Also, write down the amount of insulin or other medicine that you take.  Other information, like what you ate, exercise done and how you were feeling, will also be helpful.       Track and Manage Symptoms-Heart Failure/health conditions   On track    Timeframe:  Long-Range Goal Priority:  Medium Start Date:  06/27/2021                           Expected End Date:  12/28/2021                     Follow Up Date 08/16/21   Patient Goals: - develop a rescue plan and follow rescue plan if symptoms flare-up - eat more whole grains, fruits and vegetables, lean meats and healthy fats, monitor salt intake - know when to call the doctor - track symptoms and what helps feel better or worse - weigh and record your weights daily. Call office if you gain more than 3 pounds in one day or 5 pounds in one week, have increased swelling in stomach, hands feet or legs, - continue to monitor and record blood pressure and call if your blood pressure is outside of recommended parameters - continue to take your medications as prescribed by your doctor -  continue to attend provider visits as recommended/scheduled - Call your nurse care coordinator if you continue to have difficulty getting transportation to your appointments. - continue to work with the Care Management team for ongoing care coordination/disease management needs.   Why is this important?   You will be able to handle your symptoms better if you keep track of them.  Making some simple changes to your lifestyle will help.  Eating healthy is one thing you can do to take good care of yourself.    Notes:        Hypertension, Adult Hypertension is another name for high blood pressure. High blood pressure forces your heart to work harder to pump blood. This can cause problems overtime. There are two numbers in a blood pressure reading. There is a top number (systolic) over a bottom number (diastolic). It is best to have a blood  pressure that is below 120/80. Healthy choicescan help lower your blood pressure, or you may need medicine to help lower it. What are the causes? The cause of this condition is not known. Some conditions may be related tohigh blood pressure. What increases the risk? Smoking. Having type 2 diabetes mellitus, high cholesterol, or both. Not getting enough exercise or physical activity. Being overweight. Having too much fat, sugar, calories, or salt (sodium) in your diet. Drinking too much alcohol. Having long-term (chronic) kidney disease. Having a family history of high blood pressure. Age. Risk increases with age. Race. You may be at higher risk if you are African American. Gender. Men are at higher risk than women before age 87. After age 60, women are at higher risk than men. Having obstructive sleep apnea. Stress. What are the signs or symptoms? High blood pressure may not cause symptoms. Very high blood pressure (hypertensive crisis) may cause: Headache. Feelings of worry or nervousness (anxiety). Shortness of breath. Nosebleed. A feeling of  being sick to your stomach (nausea). Throwing up (vomiting). Changes in how you see. Very bad chest pain. Seizures. How is this treated? This condition is treated by making healthy lifestyle changes, such as: Eating healthy foods. Exercising more. Drinking less alcohol. Your health care provider may prescribe medicine if lifestyle changes are not enough to get your blood pressure under control, and if: Your top number is above 130. Your bottom number is above 80. Your personal target blood pressure may vary. Follow these instructions at home: Eating and drinking  If told, follow the DASH eating plan. To follow this plan: Fill one half of your plate at each meal with fruits and vegetables. Fill one fourth of your plate at each meal with whole grains. Whole grains include whole-wheat pasta, brown rice, and whole-grain bread. Eat or drink low-fat dairy products, such as skim milk or low-fat yogurt. Fill one fourth of your plate at each meal with low-fat (lean) proteins. Low-fat proteins include fish, chicken without skin, eggs, beans, and tofu. Avoid fatty meat, cured and processed meat, or chicken with skin. Avoid pre-made or processed food. Eat less than 1,500 mg of salt each day. Do not drink alcohol if: Your doctor tells you not to drink. You are pregnant, may be pregnant, or are planning to become pregnant. If you drink alcohol: Limit how much you use to: 0-1 drink a day for women. 0-2 drinks a day for men. Be aware of how much alcohol is in your drink. In the [place] one drink equals one 12 oz bottle of beer (355 mL), one 5 oz glass of wine (148 mL), or one 1 oz glass of hard liquor (44 mL).  Lifestyle  Work with your doctor to stay at a healthy weight or to lose weight. Ask your doctor what the best weight is for you. Get at least 30 minutes of exercise most days of the week. This may include walking, swimming, or biking. Get at least 30 minutes of exercise that strengthens  your muscles (resistance exercise) at least 3 days a week. This may include lifting weights or doing Pilates. Do not use any products that contain nicotine or tobacco, such as cigarettes, e-cigarettes, and chewing tobacco. If you need help quitting, ask your doctor. Check your blood pressure at home as told by your doctor. Keep all follow-up visits as told by your doctor. This is important.  Medicines Take over-the-counter and prescription medicines only as told by your doctor. Follow directions carefully. Do not  skip doses of blood pressure medicine. The medicine does not work as well if you skip doses. Skipping doses also puts you at risk for problems. Ask your doctor about side effects or reactions to medicines that you should watch for. Contact a doctor if you: Think you are having a reaction to the medicine you are taking. Have headaches that keep coming back (recurring). Feel dizzy. Have swelling in your ankles. Have trouble with your vision. Get help right away if you: Get a very bad headache. Start to feel mixed up (confused). Feel weak or numb. Feel faint. Have very bad pain in your: Chest. Belly (abdomen). Throw up more than once. Have trouble breathing. Summary Hypertension is another name for high blood pressure. High blood pressure forces your heart to work harder to pump blood. For most people, a normal blood pressure is less than 120/80. Making healthy choices can help lower blood pressure. If your blood pressure does not get lower with healthy choices, you may need to take medicine. This information is not intended to replace advice given to you by your health care provider. Make sure you discuss any questions you have with your healthcare provider. Document Revised: 07/31/2018 Document Reviewed: 07/31/2018 Elsevier Patient Education  2022 [place] patient verbalized understanding of instructions, educational materials, and care plan provided today and  agreed to receive a mailed copy of patient instructions, educational materials, and care plan.   Telephone follow up appointment with care management team member scheduled for: The patient has been provided with contact information for the care management team and has been advised to call with any health related questions or concerns.   Thea Silversmith, RN, MSN, BSN, CCM Care Management Coordinator [facility] [phone]

## 2021-07-28 NOTE — Patient Instructions (Signed)
Mrs. Swantek It was a pleasure speaking with you today.  I have below is a summary of our visit today and information about your health goals.  If you have any questions or concerns, please feel free to contact me either at the phone number below or with a MyChart message.    Cherre Robins, PharmD Clinical Pharmacist [facility] [phone] (direct line)  [phone] (main office number)   PATIENT GOALS:  Goals Addressed             This Visit's Progress    Chronic Care Mangement Pharmacy Care Plan       Current Barriers:  Cost of medication high Unable to maintain control of heart disease, blood pressure Frequent visits to emergency department recently  Pharmacist Clinical Goal(s):  Over the next 90 days, patient will maintain / attain control of diabetes, blood pressure and cholesterol as evidenced by attaining disease specific goals listed below  adhere to prescribed medication regimen as evidenced by medication refill records and attaining goal listed below contact provider office for questions/concerns as evidenced notation of same in electronic health record Consider programs to help with medication costs and assess possibility of signing up for a Medicare Part D plan through collaboration with PharmD and provider.   Interventions: 1:1 collaboration with Carollee Herter, Alferd Apa, DO regarding development and update of comprehensive plan of care as evidenced by provider attestation and co-signature Inter-disciplinary care team collaboration (see longitudinal plan of care) Comprehensive medication review performed; medication list updated in electronic medical record  Diabetes: A1c goal < 7.0% Lab Results  Component Value Date   HGBA1C 6.3 (H) 05/25/2021  Current treatment: Lantus 25 units injected into skin once a day Apidra 10 to 15 units injected into skin prior to meals 3 times a day per sliding scale / blood glucose reading.   Interventions:  Discussed A1c goal Reviewed home blood glucose readings and reviewed goals  Fasting blood glucose goal (before meals) = 80 to 130 Blood glucose goal after a meal = less than 180  Continue current regimen for diabetes and follow up with endocrinology office Continue to check blood glucose 3 to 4 times a day and record for future visits  Hypertension / Congestive Heart Disease: Goals: BP  <140/90, decreased symptoms of congestive heart disease such as edema and shortness of breath and to prevent exacerbations / hospitalizations BP Readings from Last 3 Encounters:  07/20/21 (!) 153/66  07/14/21 (!) 161/76  07/05/21 (!) 142/62  Current treatment: Hydralazine 64m 3 times a day (increased by Dr HDebara Pickett08/17/2022) Metoprolol tartrate 258mtwice a day  Torsemide 2062m take 2 tablets = 34m42mch morning and 1 tablet = 20mg19mday (increased by Dr HiltyDebara Pickett2/2022) Interventions:  Recommended checking blood pressure daily and record reading for future visits Discussed signs and symptoms of CHF exacerbation - weight gain, SOB, abdominal fullness, swelling in legs or abdomen, Fatigue and weakness, changes in ability to perform usual activities, persistent cough or wheezing with white or pink blood-tinged mucus, nausea and lack of appetite Continue to weigh daily - report weight gain of more than 3 lbs in 24 hours or 5 lbs in 1 week or signs listed above to provider Continue to follow up with Dr HiltyDebara Pickettication Management:  Current Pharmacy: [facility] Interventions:  Please review the information sent about possible Medicare Part D plans. Compare to see if yearly cost would be less than you are currently paying  at the pharmacy.  The clinical pharmacist can assist you in signing up for a Part D plan. Review included information about the [place] [facility] [employer] program. You might qualify to get a few of her medications for free.   Patient Goals/Self-Care Activities Over the  next 90 days, patient will:  take medications as prescribed, focus on medication adherence by refilling needed medications and using pill boxes,  check glucose 3 to 4 times a day, document, and provide at future appointments,  check blood pressure daily, document, and provide at future appointments,  weigh daily, and contact provider if weight gain of 3 pounds in 24 hours or 5 pounds in 1 week, and  collaborate with provider on medication access solutions  Follow Up Plan: Telephone follow up appointment with care management team member scheduled for:  2 weeks.          The patient verbalized understanding of instructions, educational materials, and care plan provided today and agreed to receive a mailed copy of patient instructions, educational materials, and care plan.   Telephone follow up appointment with care management team member scheduled for: 2 weeks  Cherre Robins, PharmD Clinical Pharmacist [facility] Primary [facility]

## 2021-07-29 ENCOUNTER — Telehealth: Payer: Medicare Other

## 2021-07-29 ENCOUNTER — Emergency Department (HOSPITAL_COMMUNITY)
Admission: EM | Admit: 2021-07-29 | Discharge: 2021-07-30 | Disposition: A | Discharge status: Home / Self Care | Payer: Medicare Other | Attending: Emergency Medicine | Admitting: Emergency Medicine

## 2021-07-29 ENCOUNTER — Encounter (HOSPITAL_COMMUNITY): Payer: Self-pay

## 2021-07-29 ENCOUNTER — Other Ambulatory Visit: Payer: Self-pay

## 2021-07-29 DIAGNOSIS — N3000 Acute cystitis without hematuria: Secondary | ICD-10-CM

## 2021-07-29 DIAGNOSIS — Z794 Long term (current) use of insulin: Secondary | ICD-10-CM | POA: Diagnosis not present

## 2021-07-29 DIAGNOSIS — R300 Dysuria: Secondary | ICD-10-CM | POA: Diagnosis not present

## 2021-07-29 DIAGNOSIS — Z7982 Long term (current) use of aspirin: Secondary | ICD-10-CM | POA: Insufficient documentation

## 2021-07-29 DIAGNOSIS — I130 Hypertensive heart and chronic kidney disease with heart failure and stage 1 through stage 4 chronic kidney disease, or unspecified chronic kidney disease: Secondary | ICD-10-CM | POA: Insufficient documentation

## 2021-07-29 DIAGNOSIS — Z79899 Other long term (current) drug therapy: Secondary | ICD-10-CM | POA: Insufficient documentation

## 2021-07-29 DIAGNOSIS — E039 Hypothyroidism, unspecified: Secondary | ICD-10-CM | POA: Insufficient documentation

## 2021-07-29 DIAGNOSIS — E1122 Type 2 diabetes mellitus with diabetic chronic kidney disease: Secondary | ICD-10-CM | POA: Insufficient documentation

## 2021-07-29 DIAGNOSIS — Z8616 Personal history of COVID-19: Secondary | ICD-10-CM | POA: Diagnosis not present

## 2021-07-29 DIAGNOSIS — Z87891 Personal history of nicotine dependence: Secondary | ICD-10-CM | POA: Diagnosis not present

## 2021-07-29 DIAGNOSIS — J45909 Unspecified asthma, uncomplicated: Secondary | ICD-10-CM | POA: Insufficient documentation

## 2021-07-29 DIAGNOSIS — N184 Chronic kidney disease, stage 4 (severe): Secondary | ICD-10-CM | POA: Insufficient documentation

## 2021-07-29 DIAGNOSIS — I5033 Acute on chronic diastolic (congestive) heart failure: Secondary | ICD-10-CM | POA: Insufficient documentation

## 2021-07-29 DIAGNOSIS — I10 Essential (primary) hypertension: Secondary | ICD-10-CM | POA: Diagnosis not present

## 2021-07-29 DIAGNOSIS — R739 Hyperglycemia, unspecified: Secondary | ICD-10-CM | POA: Diagnosis not present

## 2021-07-29 DIAGNOSIS — I959 Hypotension, unspecified: Secondary | ICD-10-CM | POA: Diagnosis not present

## 2021-07-29 DIAGNOSIS — G4489 Other headache syndrome: Secondary | ICD-10-CM | POA: Diagnosis not present

## 2021-07-29 LAB — URINALYSIS, ROUTINE W REFLEX MICROSCOPIC
Bilirubin Urine: NEGATIVE
Glucose, UA: NEGATIVE mg/dL
Ketones, ur: NEGATIVE mg/dL
Nitrite: POSITIVE — AB
Protein, ur: NEGATIVE mg/dL
Specific Gravity, Urine: 1.01 (ref 1.005–1.030)
WBC, UA: >50 WBC/hpf — ABNORMAL HIGH (ref 0–5)
pH: 5 (ref 5.0–8.0)

## 2021-07-29 MED ORDER — CIPROFLOXACIN HCL 500 MG PO TABS
500.0000 mg | ORAL_TABLET | Freq: Once | ORAL | Status: AC
  Administered 2021-07-30: 500 mg via ORAL
  Filled 2021-07-29: qty 1

## 2021-07-29 MED ORDER — CARVEDILOL 3.125 MG PO TABS
6.2500 mg | ORAL_TABLET | Freq: Once | ORAL | Status: AC
  Administered 2021-07-30: 6.25 mg via ORAL
  Filled 2021-07-29: qty 2

## 2021-07-29 MED ORDER — CIPROFLOXACIN HCL 250 MG PO TABS: 250.0000 mg | ORAL_TABLET | Freq: Two times a day (BID) | ORAL | 0 refills | Status: AC

## 2021-07-29 MED ORDER — NITROFURANTOIN MONOHYD MACRO 100 MG PO CAPS: 100.0000 mg | ORAL_CAPSULE | Freq: Once | ORAL | Status: DC

## 2021-07-29 MED ORDER — HYDRALAZINE HCL 25 MG PO TABS
50.0000 mg | ORAL_TABLET | Freq: Once | ORAL | Status: AC
  Administered 2021-07-30: 50 mg via ORAL
  Filled 2021-07-29: qty 2

## 2021-07-29 NOTE — ED Provider Notes (Addendum)
[facility] DEPT Provider Note   CSN: [id number] Arrival date & time: 07/29/21  2104     History Chief Complaint  Patient presents with   Dysuria    Margaret Dyer is a 86 y.o. female.  HPI  86 year old female past medical history of CKD, CHF, HTN, HLD, DM presents the emergency department with dysuria.  Patient states this is been ongoing for the past 2 days.  Denies any hematuria.  No pelvic or back pain.  No reported fever, nausea or vomiting.  Past Medical History:  Diagnosis Date   Asthma    Blood transfusion    Chronic diastolic CHF (congestive heart failure) (HCC)    CKD (chronic kidney disease), stage III (HCC)    Coronary artery calcification seen on CT scan    Diabetes mellitus    Goiter    Hyperlipidemia    Hypertension    Hypothyroidism    Liver cirrhosis (HCC)    NASH (nonalcoholic steatohepatitis)    Obesity    Osteopenia    Pulmonary hypertension (HCC)    Renal cell carcinoma    a. prior nephrectomy.   Right heart failure (HCC)    Thrombocytopenia [facility])     Patient Active Problem List   Diagnosis Date Noted   Uncontrolled type 2 diabetes mellitus with hyperglycemia ([place]) 06/09/2021   Chronic renal disease, stage 4, severely decreased glomerular filtration rate (GFR) between 15-29 mL/min/1.73 square meter ([place]) 06/09/2021   CHF exacerbation ([place]) 05/25/2021   Acute on chronic diastolic CHF (congestive heart failure) ([place]) 05/25/2021   Closed fracture of maxillary sinus ([place]) 07/10/2019   UTI (urinary tract infection) 07/03/2019   Food poisoning 07/03/2019   Bacteremia 07/03/2019   Exposure to COVID-19 virus 06/16/2019   Closed left hip fracture ([place]) 12/24/2018   Dysuria 12/17/2017   Dizziness 12/17/2017   Community acquired pneumonia 12/17/2017   Pneumonia 12/01/2017   Urinary incontinence 11/09/2017   Tic 11/09/2017   Pulmonary hypertension ([place]) 09/12/2017   Chronic diastolic CHF (congestive heart failure)  ([place]) 06/05/2017   Oral candida 05/29/2017   Vitamin D deficiency 05/29/2017   Chronic respiratory failure ([place]) 05/24/2017   Anxiety 05/24/2017   HCAP (healthcare-associated pneumonia) 05/22/2017   Right heart failure ([name]) 19/37/9024   Acute diastolic CHF (congestive heart failure) (HCC)    Exertional dyspnea 05/13/2017   Hypertension 05/13/2017   Asthma 05/13/2017   DOE (dyspnea on exertion) 05/13/2017   Diabetes mellitus type 2, insulin dependent ([place]) 05/13/2017   Acute kidney injury ([place]) 05/13/2017   History of nephrectomy 05/13/2017   Hyperlipidemia LDL goal <70 05/13/2017   Liver cirrhosis secondary to NASH ([place]) 05/13/2017   Thrombocytopenia ([place]) 05/13/2017   Renal cell carcinoma 05/13/2017   Hypothyroidism 05/13/2017   Hypersplenism 05/13/2017   Osteoarthritis 05/13/2017   Overactive bladder 05/13/2017   URI, acute 02/22/2017   Abrasion, left knee, initial encounter 02/22/2017   CTS (carpal tunnel syndrome) 03/04/2016   Bilateral contusion of ribs 10/05/2015   Dental abscess 04/15/2014   Claudication, intermittent ([place]) 12/05/2013   Obesity (BMI 30-39.9) 07/22/2013   Back pain 05/26/2013   Platelets decreased ([place]) 05/26/2013   Breast pain, right 04/21/2013   Anxiety and depression 12/05/2012   Bronchitis 11/06/2012   HEMATURIA UNSPECIFIED 02/17/2011   Hyperlipidemia 09/08/2010   B12 DEFICIENCY 07/22/2010   DIZZINESS 07/04/2010   Essential hypertension 06/21/2010   GOUT, UNSPECIFIED 01/27/2010   NEPHRECTOMY, HX OF 12/02/2009   NECK PAIN, LEFT 09/04/2008   PARESTHESIA 09/02/2007  GOITER NOS 04/23/2007   Diabetes mellitus ([place]) 04/23/2007   Asthma 04/23/2007   Renal stone 04/23/2007   OSTEOPENIA 04/23/2007   CARCINOMA, RENAL CELL 08/10/1999    Past Surgical History:  Procedure Laterality Date   ABDOMINAL HYSTERECTOMY     APPENDECTOMY     HEMORRHOID SURGERY     INTRAMEDULLARY (IM) NAIL INTERTROCHANTERIC Left 12/25/2018   Procedure: INTRAMEDULLARY  (IM) NAIL INTERTROCHANTRIC;  Surgeon: Nicholes Stairs, MD;  Location: [facility];  Service: Orthopedics;  Laterality: Left;   KNEE SURGERY     NEPHRECTOMY  09.2000     OB History   No obstetric history on file.     Family History  Problem Relation Age of Onset   Cancer Sister        bladder   Kidney cancer Father    Cancer Father        renal   COPD Father    Congestive Heart Failure Father    Coronary artery disease Other    Diabetes Other    Hyperlipidemia Other    Hypertension Other    Rheumatologic disease Neg Hx     Social History   Tobacco Use   Smoking status: Former   Smokeless tobacco: Never   Tobacco comments:    05/30/18 none in 40 years  Vaping Use   Vaping Use: Never used  Substance Use Topics   Alcohol use: No    Alcohol/week: 0.0 standard drinks   Drug use: No    Home Medications Prior to Admission medications   Medication Sig Start Date End Date Taking? Authorizing Provider  albuterol (PROVENTIL) (2.5 MG/3ML) 0.083% nebulizer solution Take 3 mLs (2.5 mg total) by nebulization every 6 (six) hours as needed for wheezing or shortness of breath. 06/09/21   Ann Held, DO  aspirin EC 81 MG EC tablet Take 1 tablet (81 mg total) by mouth daily. 07/07/19   Geradine Girt, DO  docusate sodium (COLACE) 100 MG capsule Take 1 capsule (100 mg total) by mouth 2 (two) times daily. 12/30/18   Bonnell Public, MD  ezetimibe (ZETIA) 10 MG tablet Take 1 tablet (10 mg total) by mouth daily. Patient not taking: Reported on 07/28/2021 06/09/21   Carollee Herter, Alferd Apa, DO  Ezetimibe-Simvastatin (VYTORIN PO) Take 1 tablet by mouth daily.    [provider]  hydrALAZINE (APRESOLINE) 50 MG tablet Take 1 tablet (50 mg total) by mouth every 8 (eight) hours. 07/20/21   Hilty, Nadean Corwin, MD  insulin glargine (LANTUS) 100 UNIT/ML injection Inject 0.25 mLs (25 Units total) into the skin at bedtime. 07/06/19   Geradine Girt, DO  Insulin Glulisine (APIDRA SOLOSTAR)  100 UNIT/ML Solostar Pen Inject 10-15 Units into the skin 3 (three) times daily before meals.     [provider]  levothyroxine (SYNTHROID, LEVOTHROID) 25 MCG tablet take 1 tablet by mouth daily Patient taking differently: Take 25 mcg by mouth. Daily except none on Saturdays and Sundays 07/13/17   Carollee Herter, Kendrick Fries R, DO  LORazepam (ATIVAN) 0.5 MG tablet Take 1 tablet (0.5 mg total) by mouth daily as needed for anxiety. 07/18/21   Ann Held, DO  metoprolol tartrate (LOPRESSOR) 25 MG tablet Take 1 tablet (25 mg total) by mouth 2 (two) times daily. 11/08/20   Hilty, Nadean Corwin, MD  Multiple Vitamins-Minerals (CENTRUM SILVER ULTRA WOMENS PO) Take 1 tablet by mouth at bedtime.     [provider]  oxybutynin (DITROPAN) 5 MG  tablet TAKE 1 TABLET BY MOUTH DAILY 04/08/21   Carollee Herter, Alferd Apa, DO  polyethylene glycol (MIRALAX / GLYCOLAX) 17 g packet Take 17 g by mouth daily as needed.    [provider]  potassium chloride SA (KLOR-CON) 20 MEQ tablet TAKE 1 TABLET BY MOUTH DAILY AT BEDTIME 02/24/21   Hilty, Nadean Corwin, MD  simvastatin (ZOCOR) 40 MG tablet Take 1 tablet (40 mg total) by mouth at bedtime. Patient not taking: Reported on 07/28/2021 06/09/21   Carollee Herter, Alferd Apa, DO  sucralfate (CARAFATE) 1 g tablet Take 1 tablet (1 g total) by mouth 4 (four) times daily -  with meals and at bedtime. Patient taking differently: Take 1 g by mouth daily as needed. 12/02/19   Ann Held, DO  torsemide (DEMADEX) 20 MG tablet Take 2 tablets (71m) by mouth in the morning and 1 tablet (235m in the afternoon. 07/25/21   Hilty, KeNadean CorwinMD  traMADol (ULTRAM) 50 MG tablet Take 1 tablet (50 mg total) by mouth every 6 (six) hours as needed for moderate pain. 06/09/21   LoAnn HeldDO    Allergies    Other, Bactrim [sulfamethoxazole-trimethoprim], Cefuroxime axetil, and Ciprofloxacin  Review of Systems   Review of Systems  Constitutional:  Negative for chills  and fever.  HENT:  Negative for congestion.   Respiratory:  Negative for shortness of breath.   Cardiovascular:  Negative for chest pain.  Gastrointestinal:  Negative for abdominal pain, nausea and vomiting.  Genitourinary:  Positive for dysuria and frequency. Negative for difficulty urinating, flank pain and pelvic pain.  Skin:  Negative for rash.  Neurological:  Negative for headaches.   Physical Exam Updated Vital Signs BP (!) 172/58   Pulse (!) 58   Temp 98.5 F (36.9 C) (Oral)   Resp 15   Ht 5' 3.5" (1.613 m)   Wt 93.7 kg   LMP  (LMP Unknown)   SpO2 99%   BMI 36.02 kg/m   Physical Exam Vitals and nursing note reviewed.  Constitutional:      Appearance: Normal appearance.  HENT:     Head: Normocephalic.     Mouth/Throat:     Mouth: Mucous membranes are moist.  Cardiovascular:     Rate and Rhythm: Normal rate.  Pulmonary:     Effort: Pulmonary effort is normal. No respiratory distress.  Abdominal:     Palpations: Abdomen is soft.     Tenderness: There is no abdominal tenderness. There is no right CVA tenderness or left CVA tenderness.  Skin:    General: Skin is warm.  Neurological:     Mental Status: She is alert and oriented to person, place, and time. Mental status is at baseline.  Psychiatric:        Mood and Affect: Mood normal.    ED Results / Procedures / Treatments   Labs (all labs ordered are listed, but only abnormal results are displayed) Labs Reviewed  URINALYSIS, ROUTINE W REFLEX MICROSCOPIC - Abnormal; Notable for the following components:      Result Value   APPearance HAZY (*)    Hgb urine dipstick MODERATE (*)    Nitrite POSITIVE (*)    Leukocytes,Ua LARGE (*)    WBC, UA >50 (*)    Bacteria, UA RARE (*)    All other components within normal limits    EKG None  Radiology No results found.  Procedures Procedures   Medications Ordered in ED Medications - No  data to display  ED Course  I have reviewed the triage vital signs and  the nursing notes.  Pertinent labs & imaging results that were available during my care of the patient were reviewed by me and considered in my medical decision making (see chart for details).    MDM Rules/Calculators/A&P                           86 year old female presents emergency department with complaint of dysuria.  Vitals are normal and stable on arrival, she is 3 L nasal cannula at baseline.  Abdomen is soft and nontender.  No other systemic symptoms.  Urinalysis is significant for UTI.  Will treat and refer for outpatient care.  Patient at this time appears safe and stable for discharge and will be treated as an outpatient.  Discharge plan and strict return to ED precautions discussed, patient verbalizes understanding and agreement.  Patient has documented allergy to Bactrim, states that she cannot take Keflex.  She states that she normally takes ciprofloxacin.  This is listed as an allergy secondary to nausea but she states that she takes it without difficulty.  Also patient is hypertensive but missed her nighttime dose of blood pressure medications.  Will administer all through these medications here in the department and discharge.  Final Clinical Impression(s) / ED Diagnoses Final diagnoses:  None    Rx / DC Orders ED Discharge Orders     None        Lorelle Gibbs, DO 07/29/21 2315    Lorelle Gibbs, DO 07/29/21 2359

## 2021-07-29 NOTE — Discharge Instructions (Addendum)
You have been seen and discharged from the emergency department.  You have been diagnosed with a urinary tract infection.  Take your antibiotic as directed.  Your next dose will be the morning at 8/27.  Follow-up with your primary provider for reevaluation and further care. Take home medications as prescribed. If you have any worsening symptoms or further concerns for your health please return to an emergency department for further evaluation.

## 2021-07-29 NOTE — ED Triage Notes (Signed)
Patient BIB [facility] from home with painful urination for 2 days. History of UTI's. 3L [place] at baseline.

## 2021-07-30 DIAGNOSIS — I10 Essential (primary) hypertension: Secondary | ICD-10-CM | POA: Diagnosis not present

## 2021-07-30 DIAGNOSIS — R0902 Hypoxemia: Secondary | ICD-10-CM | POA: Diagnosis not present

## 2021-07-30 DIAGNOSIS — Z743 Need for continuous supervision: Secondary | ICD-10-CM | POA: Diagnosis not present

## 2021-07-30 DIAGNOSIS — R300 Dysuria: Secondary | ICD-10-CM | POA: Diagnosis not present

## 2021-07-30 NOTE — ED Notes (Signed)
[facility] called for transport.

## 2021-08-01 DIAGNOSIS — M19012 Primary osteoarthritis, left shoulder: Secondary | ICD-10-CM | POA: Diagnosis not present

## 2021-08-01 DIAGNOSIS — E039 Hypothyroidism, unspecified: Secondary | ICD-10-CM | POA: Diagnosis not present

## 2021-08-01 DIAGNOSIS — J449 Chronic obstructive pulmonary disease, unspecified: Secondary | ICD-10-CM | POA: Diagnosis not present

## 2021-08-01 DIAGNOSIS — K7460 Unspecified cirrhosis of liver: Secondary | ICD-10-CM | POA: Diagnosis not present

## 2021-08-01 DIAGNOSIS — E1122 Type 2 diabetes mellitus with diabetic chronic kidney disease: Secondary | ICD-10-CM | POA: Diagnosis not present

## 2021-08-01 DIAGNOSIS — M170 Bilateral primary osteoarthritis of knee: Secondary | ICD-10-CM | POA: Diagnosis not present

## 2021-08-01 DIAGNOSIS — K7581 Nonalcoholic steatohepatitis (NASH): Secondary | ICD-10-CM | POA: Diagnosis not present

## 2021-08-01 DIAGNOSIS — M19011 Primary osteoarthritis, right shoulder: Secondary | ICD-10-CM | POA: Diagnosis not present

## 2021-08-01 DIAGNOSIS — D696 Thrombocytopenia, unspecified: Secondary | ICD-10-CM | POA: Diagnosis not present

## 2021-08-01 DIAGNOSIS — M19042 Primary osteoarthritis, left hand: Secondary | ICD-10-CM | POA: Diagnosis not present

## 2021-08-01 DIAGNOSIS — E785 Hyperlipidemia, unspecified: Secondary | ICD-10-CM | POA: Diagnosis not present

## 2021-08-01 DIAGNOSIS — E049 Nontoxic goiter, unspecified: Secondary | ICD-10-CM | POA: Diagnosis not present

## 2021-08-01 DIAGNOSIS — N179 Acute kidney failure, unspecified: Secondary | ICD-10-CM | POA: Diagnosis not present

## 2021-08-01 DIAGNOSIS — I130 Hypertensive heart and chronic kidney disease with heart failure and stage 1 through stage 4 chronic kidney disease, or unspecified chronic kidney disease: Secondary | ICD-10-CM | POA: Diagnosis not present

## 2021-08-01 DIAGNOSIS — I081 Rheumatic disorders of both mitral and tricuspid valves: Secondary | ICD-10-CM | POA: Diagnosis not present

## 2021-08-01 DIAGNOSIS — I313 Pericardial effusion (noninflammatory): Secondary | ICD-10-CM | POA: Diagnosis not present

## 2021-08-01 DIAGNOSIS — N1832 Chronic kidney disease, stage 3b: Secondary | ICD-10-CM | POA: Diagnosis not present

## 2021-08-01 DIAGNOSIS — I2510 Atherosclerotic heart disease of native coronary artery without angina pectoris: Secondary | ICD-10-CM | POA: Diagnosis not present

## 2021-08-01 DIAGNOSIS — I5033 Acute on chronic diastolic (congestive) heart failure: Secondary | ICD-10-CM | POA: Diagnosis not present

## 2021-08-01 DIAGNOSIS — M8580 Other specified disorders of bone density and structure, unspecified site: Secondary | ICD-10-CM | POA: Diagnosis not present

## 2021-08-01 DIAGNOSIS — I2723 Pulmonary hypertension due to lung diseases and hypoxia: Secondary | ICD-10-CM | POA: Diagnosis not present

## 2021-08-01 DIAGNOSIS — N3281 Overactive bladder: Secondary | ICD-10-CM | POA: Diagnosis not present

## 2021-08-01 DIAGNOSIS — I50810 Right heart failure, unspecified: Secondary | ICD-10-CM | POA: Diagnosis not present

## 2021-08-01 DIAGNOSIS — J9621 Acute and chronic respiratory failure with hypoxia: Secondary | ICD-10-CM | POA: Diagnosis not present

## 2021-08-01 DIAGNOSIS — M19041 Primary osteoarthritis, right hand: Secondary | ICD-10-CM | POA: Diagnosis not present

## 2021-08-03 DIAGNOSIS — C649 Malignant neoplasm of unspecified kidney, except renal pelvis: Secondary | ICD-10-CM | POA: Diagnosis not present

## 2021-08-03 DIAGNOSIS — I509 Heart failure, unspecified: Secondary | ICD-10-CM | POA: Diagnosis not present

## 2021-08-03 DIAGNOSIS — E785 Hyperlipidemia, unspecified: Secondary | ICD-10-CM | POA: Diagnosis not present

## 2021-08-03 DIAGNOSIS — I10 Essential (primary) hypertension: Secondary | ICD-10-CM | POA: Diagnosis not present

## 2021-08-03 DIAGNOSIS — E119 Type 2 diabetes mellitus without complications: Secondary | ICD-10-CM

## 2021-08-03 DIAGNOSIS — Z794 Long term (current) use of insulin: Secondary | ICD-10-CM

## 2021-08-03 DIAGNOSIS — N183 Chronic kidney disease, stage 3 unspecified: Secondary | ICD-10-CM | POA: Diagnosis not present

## 2021-08-03 DIAGNOSIS — E1122 Type 2 diabetes mellitus with diabetic chronic kidney disease: Secondary | ICD-10-CM | POA: Diagnosis not present

## 2021-08-03 DIAGNOSIS — R300 Dysuria: Secondary | ICD-10-CM | POA: Diagnosis not present

## 2021-08-03 DIAGNOSIS — I129 Hypertensive chronic kidney disease with stage 1 through stage 4 chronic kidney disease, or unspecified chronic kidney disease: Secondary | ICD-10-CM | POA: Diagnosis not present

## 2021-08-04 DIAGNOSIS — I50810 Right heart failure, unspecified: Secondary | ICD-10-CM | POA: Diagnosis not present

## 2021-08-04 DIAGNOSIS — I5033 Acute on chronic diastolic (congestive) heart failure: Secondary | ICD-10-CM | POA: Diagnosis not present

## 2021-08-04 DIAGNOSIS — N1832 Chronic kidney disease, stage 3b: Secondary | ICD-10-CM | POA: Diagnosis not present

## 2021-08-04 DIAGNOSIS — E1122 Type 2 diabetes mellitus with diabetic chronic kidney disease: Secondary | ICD-10-CM | POA: Diagnosis not present

## 2021-08-04 DIAGNOSIS — I2510 Atherosclerotic heart disease of native coronary artery without angina pectoris: Secondary | ICD-10-CM | POA: Diagnosis not present

## 2021-08-04 DIAGNOSIS — I130 Hypertensive heart and chronic kidney disease with heart failure and stage 1 through stage 4 chronic kidney disease, or unspecified chronic kidney disease: Secondary | ICD-10-CM | POA: Diagnosis not present

## 2021-08-10 ENCOUNTER — Telehealth: Payer: Medicare Other

## 2021-08-11 DIAGNOSIS — N1832 Chronic kidney disease, stage 3b: Secondary | ICD-10-CM | POA: Diagnosis not present

## 2021-08-11 DIAGNOSIS — E1122 Type 2 diabetes mellitus with diabetic chronic kidney disease: Secondary | ICD-10-CM | POA: Diagnosis not present

## 2021-08-11 DIAGNOSIS — I130 Hypertensive heart and chronic kidney disease with heart failure and stage 1 through stage 4 chronic kidney disease, or unspecified chronic kidney disease: Secondary | ICD-10-CM | POA: Diagnosis not present

## 2021-08-11 DIAGNOSIS — I5033 Acute on chronic diastolic (congestive) heart failure: Secondary | ICD-10-CM | POA: Diagnosis not present

## 2021-08-11 DIAGNOSIS — I2510 Atherosclerotic heart disease of native coronary artery without angina pectoris: Secondary | ICD-10-CM | POA: Diagnosis not present

## 2021-08-11 DIAGNOSIS — I50810 Right heart failure, unspecified: Secondary | ICD-10-CM | POA: Diagnosis not present

## 2021-08-15 ENCOUNTER — Ambulatory Visit (INDEPENDENT_AMBULATORY_CARE_PROVIDER_SITE_OTHER): Payer: Medicare Other | Admitting: Pulmonary Disease

## 2021-08-15 ENCOUNTER — Other Ambulatory Visit: Payer: Self-pay

## 2021-08-15 ENCOUNTER — Encounter: Payer: Self-pay | Admitting: Pulmonary Disease

## 2021-08-15 VITALS — BP 118/68 | HR 70 | Temp 98.0°F | Ht 63.0 in | Wt 199.8 lb

## 2021-08-15 DIAGNOSIS — G4733 Obstructive sleep apnea (adult) (pediatric): Secondary | ICD-10-CM

## 2021-08-15 DIAGNOSIS — D649 Anemia, unspecified: Secondary | ICD-10-CM | POA: Diagnosis not present

## 2021-08-15 DIAGNOSIS — D696 Thrombocytopenia, unspecified: Secondary | ICD-10-CM | POA: Diagnosis not present

## 2021-08-15 DIAGNOSIS — J9611 Chronic respiratory failure with hypoxia: Secondary | ICD-10-CM

## 2021-08-15 DIAGNOSIS — R0602 Shortness of breath: Secondary | ICD-10-CM

## 2021-08-15 MED ORDER — IPRATROPIUM-ALBUTEROL 0.5-2.5 (3) MG/3ML IN SOLN: 3.0000 mL | Freq: Three times a day (TID) | RESPIRATORY_TRACT | 3 refills | Status: DC

## 2021-08-15 NOTE — Progress Notes (Signed)
Subjective:   PATIENT ID: Margaret Barters GENDER: female DOB: [birth year], MRN: [id number]   HPI  Chief Complaint  Patient presents with   Consult    SOB often, PCP sent    Reason for Visit: New consult for shortness of breath  [name] is a 86 year old female with with negligible smoking history with asthma, DM2, HTN, pulmonary hypertension, chronic diastolic heart failure, cirrhosis secondary to NASH, RCC s/p left nephrectomy, stage IIIb CKD who presents for hypoxemic failure.  She is mainly non-ambulatory and uses a walker or wheelchair at home. After her hospitalization for acute diastolic heart failure in June 2022,  she was discharged on increased oxygen three months ago. She is currently on 3L O2. Before this she was on 2L oxygen 6 months prior. She has had multiple ED visits for various medical issues including UTI and heart failure. She is followed by Cardiology with Dr. Debara Pickett who has recently adjusted her diuretics on 07/20/21 for lower extremity edema.   Social History: Smoke 1/4 ppd x 4 years. Quit >40 years ago  I have personally reviewed patient's past medical/family/social history, allergies, current medications.  Past Medical History:  Diagnosis Date   Asthma    Blood transfusion    Chronic diastolic CHF (congestive heart failure) (HCC)    CKD (chronic kidney disease), stage III (HCC)    Coronary artery calcification seen on CT scan    Diabetes mellitus    Goiter    Hyperlipidemia    Hypertension    Hypothyroidism    Liver cirrhosis (HCC)    NASH (nonalcoholic steatohepatitis)    Obesity    Osteopenia    Pulmonary hypertension (HCC)    Renal cell carcinoma    a. prior nephrectomy.   Right heart failure (HCC)    Thrombocytopenia ([place])      Family History  Problem Relation Age of Onset   Cancer Sister        bladder   Kidney cancer Father    Cancer Father        renal   COPD Father    Congestive Heart Failure Father    Coronary artery  disease Other    Diabetes Other    Hyperlipidemia Other    Hypertension Other    Rheumatologic disease Neg Hx      Social History   Occupational History    Comment: retired  Tobacco Use   Smoking status: Former   Smokeless tobacco: Never   Tobacco comments:    05/30/18 none in 40 years  Vaping Use   Vaping Use: Never used  Substance and Sexual Activity   Alcohol use: No    Alcohol/week: 0.0 standard drinks   Drug use: No   Sexual activity: Not on file    Allergies  Allergen Reactions   Other Other (See Comments)    Patient has hemorrhaged at least 4 times in her lifetime   Bactrim [Sulfamethoxazole-Trimethoprim]    Cefuroxime Axetil Nausea Only    Upset stomach    Ciprofloxacin Nausea Only    Upset stomach     Outpatient Medications Prior to Visit  Medication Sig Dispense Refill   albuterol (PROVENTIL) (2.5 MG/3ML) 0.083% nebulizer solution Take 3 mLs (2.5 mg total) by nebulization every 6 (six) hours as needed for wheezing or shortness of breath. 75 mL 12   aspirin EC 81 MG EC tablet Take 1 tablet (81 mg total) by mouth daily.     docusate sodium (  COLACE) 100 MG capsule Take 1 capsule (100 mg total) by mouth 2 (two) times daily. 10 capsule 0   Ezetimibe-Simvastatin (VYTORIN PO) Take 1 tablet by mouth daily.     hydrALAZINE (APRESOLINE) 50 MG tablet Take 1 tablet (50 mg total) by mouth every 8 (eight) hours. 270 tablet 3   insulin glargine (LANTUS) 100 UNIT/ML injection Inject 0.25 mLs (25 Units total) into the skin at bedtime.     Insulin Glulisine (APIDRA SOLOSTAR) 100 UNIT/ML Solostar Pen Inject 10-15 Units into the skin 3 (three) times daily before meals.      levothyroxine (SYNTHROID, LEVOTHROID) 25 MCG tablet take 1 tablet by mouth daily (Patient taking differently: Take 25 mcg by mouth. Daily except none on Saturdays and Sundays) 30 tablet 5   LORazepam (ATIVAN) 0.5 MG tablet Take 1 tablet (0.5 mg total) by mouth daily as needed for anxiety. 30 tablet 1    metoprolol tartrate (LOPRESSOR) 25 MG tablet Take 1 tablet (25 mg total) by mouth 2 (two) times daily. 180 tablet 3   Multiple Vitamins-Minerals (CENTRUM SILVER ULTRA WOMENS PO) Take 1 tablet by mouth at bedtime.      oxybutynin (DITROPAN) 5 MG tablet TAKE 1 TABLET BY MOUTH DAILY 90 tablet 1   polyethylene glycol (MIRALAX / GLYCOLAX) 17 g packet Take 17 g by mouth daily as needed.     potassium chloride SA (KLOR-CON) 20 MEQ tablet TAKE 1 TABLET BY MOUTH DAILY AT BEDTIME 30 tablet 11   sucralfate (CARAFATE) 1 g tablet Take 1 tablet (1 g total) by mouth 4 (four) times daily -  with meals and at bedtime. (Patient taking differently: Take 1 g by mouth daily as needed.) 90 tablet 0   torsemide (DEMADEX) 20 MG tablet Take 2 tablets (64m) by mouth in the morning and 1 tablet (235m in the afternoon. 225 tablet 3   traMADol (ULTRAM) 50 MG tablet Take 1 tablet (50 mg total) by mouth every 6 (six) hours as needed for moderate pain. 60 tablet 1   ezetimibe (ZETIA) 10 MG tablet Take 1 tablet (10 mg total) by mouth daily. (Patient not taking: Reported on 08/15/2021) 90 tablet 3   simvastatin (ZOCOR) 40 MG tablet Take 1 tablet (40 mg total) by mouth at bedtime. (Patient not taking: Reported on 08/15/2021) 90 tablet 3   No facility-administered medications prior to visit.    Review of Systems  Constitutional:  Negative for chills, diaphoresis, fever, malaise/fatigue and weight loss.  HENT:  Negative for congestion, ear pain and sore throat.   Respiratory:  Positive for shortness of breath. Negative for cough, hemoptysis, sputum production and wheezing.   Cardiovascular:  Negative for chest pain, palpitations and leg swelling.  Gastrointestinal:  Negative for abdominal pain, heartburn and nausea.  Genitourinary:  Negative for frequency.  Musculoskeletal:  Negative for joint pain and myalgias.  Skin:  Negative for itching and rash.  Neurological:  Negative for dizziness, weakness and headaches.   Endo/Heme/Allergies:  Does not bruise/bleed easily.  Psychiatric/Behavioral:  Negative for depression. The patient is not nervous/anxious.     Objective:   Vitals:   08/15/21 1435  BP: 118/68  Pulse: 70  Temp: 98 F (36.7 C)  TempSrc: Oral  SpO2: 98%  Weight: 199 lb 12.8 oz (90.6 kg)  Height: _0  (1.6 m)   SpO2: 98 % O2 Device: Nasal cannula O2 Flow Rate (L/min): 3 L/min  Physical Exam: General: Well-appearing, no acute distress HENT: [place], AT Eyes: EOMI, no scleral icterus  Respiratory: Clear to auscultation bilaterally.  No crackles, wheezing or rales Cardiovascular: RRR, -M/R/G, no JVD Extremities:-Edema,-tenderness Neuro: AAO x4, CNII-XII grossly intact Psych: Normal mood, normal affect  Data Reviewed:  Imaging: CXR 07/04/21 - Cardiomegaly. Increased interstitial lung markings  PFT: None on file  Labs: CBC    Component Value Date/Time   WBC 5.9 07/20/2021 1622   WBC 6.9 07/04/2021 1425   RBC 3.36 (L) 07/20/2021 1622   RBC 3.34 (L) 07/04/2021 1425   HGB 10.0 (L) 07/20/2021 1622   HGB 16.0 (H) 03/05/2015 1207   HCT 31.1 (L) 07/20/2021 1622   HCT 45.8 03/05/2015 1207   PLT 104 (L) 07/20/2021 1622   MCV 93 07/20/2021 1622   MCV 88 03/05/2015 1207   MCH 29.8 07/20/2021 1622   MCH 29.9 07/04/2021 1425   MCHC 32.2 07/20/2021 1622   MCHC 31.2 07/04/2021 1425   RDW 13.2 07/20/2021 1622   RDW 12.9 03/05/2015 1207   LYMPHSABS 0.6 (L) 07/04/2021 1425   LYMPHSABS 1.3 03/05/2015 1207   MONOABS 0.7 07/04/2021 1425   EOSABS 0.1 07/04/2021 1425   EOSABS 0.1 03/05/2015 1207   BASOSABS 0.0 07/04/2021 1425   BASOSABS 0.0 03/05/2015 1207   BMET    Component Value Date/Time   NA 138 07/20/2021 1622   K 4.5 07/20/2021 1622   CL 98 07/20/2021 1622   CO2 29 07/20/2021 1622   GLUCOSE 266 (H) 07/20/2021 1622   GLUCOSE 289 (H) 07/04/2021 1425   BUN 32 (H) 07/20/2021 1622   CREATININE 1.46 (H) 07/20/2021 1622   CREATININE 1.41 (H) 03/28/2021 1338   CREATININE  1.06 (H) 11/30/2017 1559   CALCIUM 9.3 07/20/2021 1622   GFRNONAA 40 (L) 07/04/2021 1425   GFRNONAA 37 (L) 03/28/2021 1338   GFRAA 45 (L) 11/08/2020 1638   CBC with unchanged anemia with Hg~10 since June 2022. Thrombocytopenia. Prior to this is April 2022 Hg 13-14     Assessment & Plan:   Discussion:  86 year old female with multiple co-morbidities including chronic hypoxemic respiratory failure secondary to unknown etiology. Ambulatory O2 in-office demonstrates desaturation with activity.   Shortness of breath Chronic hypoxemic respiratory failure, unclear etiology but suspect multifactorial with suspected secondary pulmonary hypertension. Hx of asthma but would not expect hypoxemia from this. Will need to rule out OSA. CT Chest with prior scarring. Pending pulmonary function test, will obtain dedicated HRCT. Heart failure and deconditioning contributes to her symptoms. --START Duonebs TWICE a day --CONTINUE supplemental oxygen. Wear 3L with activity and sleep. --ARRANGE pulmonary function tests --SCHEDULE for split night study  Anemia, new in the last 3 months Thrombocytopenia, observation with Hematology --Last colonoscopy 5-10 years ago with small polyp removal and normal per patient  --Will send message to your PCP, Dr. Carollee Herter regarding anemia work-up --Keep follow-up with your Hematologist, Dr. Marin Olp in October   Health Maintenance Immunization History  Administered Date(s) Administered   Influenza Split 09/29/2011   Influenza Whole 09/04/1999, 09/20/2007, 09/04/2008, 01/18/2010, 09/19/2010   Influenza, High Dose Seasonal PF 01/05/2014, 09/23/2015, 10/24/2016, 11/09/2017   Influenza, Seasonal, Injecte, Preservative Fre 12/05/2012   Influenza,inj,Quad PF,6+ Mos 10/27/2014   Influenza-Unspecified 10/04/2018   Moderna Sars-Covid-2 Vaccination 02/12/2020, 03/22/2020, 11/06/2020   Pneumococcal Conjugate-13 05/28/2017   Tdap 10/24/2016   CT Lung Screen - not  qualified. Negligible smoking history  Orders Placed This Encounter  Procedures   Pulmonary function test    Standing Status:   Future    Standing Expiration Date:   08/15/2022  Order Specific Question:   Where should this test be performed?    Answer:    Pulmonary   Split night study    Standing Status:   Future    Standing Expiration Date:   08/15/2022    Order Specific Question:   Where should this test be performed:    Answer:   [facility] ordered this encounter  Medications   ipratropium-albuterol (DUONEB) 0.5-2.5 (3) MG/3ML SOLN    Sig: Take 3 mLs by nebulization 3 (three) times daily.    Dispense:  360 mL    Refill:  3    Return in about 2 months (around 10/15/2021).  I have spent a total time of 50-minutes on the day of the appointment reviewing prior documentation, coordinating care and discussing medical diagnosis and plan with the patient/family. Imaging, labs and tests included in this note have been reviewed and interpreted independently by me.  [place], MD [facility] 08/15/2021 2:41 PM  Office Number [phone]

## 2021-08-15 NOTE — Patient Instructions (Signed)
  Shortness of breath Chronic hypoxemic respiratory failure, unclear etiology but suspect multifactorial with suspected secondary pulmonary hypertension. Hx of asthma but would not expect hypoxemia from this. Will need to rule out OSA. CT Chest with prior scarring. Pending pulmonary function test, will obtain dedicated HRCT. Heart failure and deconditioning contributes to her symptoms. --CONTINUE supplemental oxygen. Wear 3L with activity and sleep. --ARRANGE pulmonary function tests --SCHEDULE for split night study  Anemia, new in the last 3 months Thrombocytopenia, observation with Hematology --Last colonoscopy 5-10 years ago with small polyp removal and normal per patient  --Will send message to your PCP, Dr. Carollee Herter regarding anemia work-up --Keep follow-up with your Hematologist, Dr. Marin Olp in October  Follow-up with me in 2 months with PFTs prior

## 2021-08-16 ENCOUNTER — Ambulatory Visit (INDEPENDENT_AMBULATORY_CARE_PROVIDER_SITE_OTHER): Payer: Medicare Other

## 2021-08-16 DIAGNOSIS — I10 Essential (primary) hypertension: Secondary | ICD-10-CM

## 2021-08-16 DIAGNOSIS — I509 Heart failure, unspecified: Secondary | ICD-10-CM

## 2021-08-16 DIAGNOSIS — J9611 Chronic respiratory failure with hypoxia: Secondary | ICD-10-CM

## 2021-08-16 NOTE — Chronic Care Management (AMB) (Signed)
Chronic Care Management   CCM RN Visit Note  08/16/2021 Name: Margaret Dyer MRN: [id number] DOB: 11/01/1935  Subjective: Margaret Dyer is a 86 y.o. year old female who is a primary care patient of Lowne Chase, Yvonne R, DO. The care management team was consulted for assistance with disease management and care coordination needs.    Engaged with patient by telephone for follow up visit in response to provider referral for case management and/or care coordination services.   Consent to Services:  The patient was given information about Chronic Care Management services, agreed to services, and gave verbal consent prior to initiation of services.  Please see initial visit note for detailed documentation.   Patient agreed to services and verbal consent obtained.   Assessment: Review of patient past medical history, allergies, medications, health status, including review of consultants reports, laboratory and other test data, was performed as part of comprehensive evaluation and provision of chronic care management services.   SDOH (Social Determinants of Health) assessments and interventions performed:    CCM Care Plan  Allergies  Allergen Reactions   Other Other (See Comments)    Patient has hemorrhaged at least 4 times in her lifetime   Bactrim [Sulfamethoxazole-Trimethoprim]    Cefuroxime Axetil Nausea Only    Upset stomach    Ciprofloxacin Nausea Only    Upset stomach    Outpatient Encounter Medications as of 08/16/2021  Medication Sig   albuterol (PROVENTIL) (2.5 MG/3ML) 0.083% nebulizer solution Take 3 mLs (2.5 mg total) by nebulization every 6 (six) hours as needed for wheezing or shortness of breath.   aspirin EC 81 MG EC tablet Take 1 tablet (81 mg total) by mouth daily.   docusate sodium (COLACE) 100 MG capsule Take 1 capsule (100 mg total) by mouth 2 (two) times daily.   Ezetimibe-Simvastatin (VYTORIN PO) Take 1 tablet by mouth daily.   hydrALAZINE (APRESOLINE) 50 MG  tablet Take 1 tablet (50 mg total) by mouth every 8 (eight) hours.   insulin glargine (LANTUS) 100 UNIT/ML injection Inject 0.25 mLs (25 Units total) into the skin at bedtime.   Insulin Glulisine (APIDRA SOLOSTAR) 100 UNIT/ML Solostar Pen Inject 10-15 Units into the skin 3 (three) times daily before meals.    ipratropium-albuterol (DUONEB) 0.5-2.5 (3) MG/3ML SOLN Take 3 mLs by nebulization 3 (three) times daily.   levothyroxine (SYNTHROID, LEVOTHROID) 25 MCG tablet take 1 tablet by mouth daily (Patient taking differently: Take 25 mcg by mouth. Daily except none on Saturdays and Sundays)   LORazepam (ATIVAN) 0.5 MG tablet Take 1 tablet (0.5 mg total) by mouth daily as needed for anxiety.   metoprolol tartrate (LOPRESSOR) 25 MG tablet Take 1 tablet (25 mg total) by mouth 2 (two) times daily.   Multiple Vitamins-Minerals (CENTRUM SILVER ULTRA WOMENS PO) Take 1 tablet by mouth at bedtime.    oxybutynin (DITROPAN) 5 MG tablet TAKE 1 TABLET BY MOUTH DAILY   polyethylene glycol (MIRALAX / GLYCOLAX) 17 g packet Take 17 g by mouth daily as needed.   potassium chloride SA (KLOR-CON) 20 MEQ tablet TAKE 1 TABLET BY MOUTH DAILY AT BEDTIME   sucralfate (CARAFATE) 1 g tablet Take 1 tablet (1 g total) by mouth 4 (four) times daily -  with meals and at bedtime. (Patient taking differently: Take 1 g by mouth daily as needed.)   torsemide (DEMADEX) 20 MG tablet Take 2 tablets (40mg) by mouth in the morning and 1 tablet (20mg) in the afternoon.   traMADol (ULTRAM)   50 MG tablet Take 1 tablet (50 mg total) by mouth every 6 (six) hours as needed for moderate pain.   ezetimibe (ZETIA) 10 MG tablet Take 1 tablet (10 mg total) by mouth daily. (Patient not taking: No sig reported)   simvastatin (ZOCOR) 40 MG tablet Take 1 tablet (40 mg total) by mouth at bedtime. (Patient not taking: No sig reported)   No facility-administered encounter medications on file as of 08/16/2021.    Patient Active Problem List   Diagnosis Date  Noted   Uncontrolled type 2 diabetes mellitus with hyperglycemia (HCC) 06/09/2021   Chronic renal disease, stage 4, severely decreased glomerular filtration rate (GFR) between 15-29 mL/min/1.73 square meter (HCC) 06/09/2021   CHF exacerbation (HCC) 05/25/2021   Acute on chronic diastolic CHF (congestive heart failure) (HCC) 05/25/2021   Closed fracture of maxillary sinus (HCC) 07/10/2019   UTI (urinary tract infection) 07/03/2019   Food poisoning 07/03/2019   Bacteremia 07/03/2019   Exposure to COVID-19 virus 06/16/2019   Closed left hip fracture (HCC) 12/24/2018   Dysuria 12/17/2017   Dizziness 12/17/2017   Community acquired pneumonia 12/17/2017   Pneumonia 12/01/2017   Urinary incontinence 11/09/2017   Tic 11/09/2017   Pulmonary hypertension (HCC) 09/12/2017   Chronic diastolic CHF (congestive heart failure) (HCC) 06/05/2017   Oral candida 05/29/2017   Vitamin D deficiency 05/29/2017   Chronic respiratory failure (HCC) 05/24/2017   Anxiety 05/24/2017   HCAP (healthcare-associated pneumonia) 05/22/2017   Right heart failure (HCC) 05/17/2017   Acute diastolic CHF (congestive heart failure) (HCC)    Exertional dyspnea 05/13/2017   Hypertension 05/13/2017   Asthma 05/13/2017   DOE (dyspnea on exertion) 05/13/2017   Diabetes mellitus type 2, insulin dependent (HCC) 05/13/2017   Acute kidney injury (HCC) 05/13/2017   History of nephrectomy 05/13/2017   Hyperlipidemia LDL goal <70 05/13/2017   Liver cirrhosis secondary to NASH (HCC) 05/13/2017   Thrombocytopenia (HCC) 05/13/2017   Renal cell carcinoma 05/13/2017   Hypothyroidism 05/13/2017   Hypersplenism 05/13/2017   Osteoarthritis 05/13/2017   Overactive bladder 05/13/2017   URI, acute 02/22/2017   Abrasion, left knee, initial encounter 02/22/2017   CTS (carpal tunnel syndrome) 03/04/2016   Bilateral contusion of ribs 10/05/2015   Dental abscess 04/15/2014   Claudication, intermittent (HCC) 12/05/2013   Obesity (BMI  30-39.9) 07/22/2013   Dyer pain 05/26/2013   Platelets decreased (HCC) 05/26/2013   Breast pain, right 04/21/2013   Anxiety and depression 12/05/2012   Bronchitis 11/06/2012   HEMATURIA UNSPECIFIED 02/17/2011   Hyperlipidemia 09/08/2010   B12 DEFICIENCY 07/22/2010   DIZZINESS 07/04/2010   Essential hypertension 06/21/2010   GOUT, UNSPECIFIED 01/27/2010   NEPHRECTOMY, HX OF 12/02/2009   NECK PAIN, LEFT 09/04/2008   PARESTHESIA 09/02/2007   GOITER NOS 04/23/2007   Diabetes mellitus (HCC) 04/23/2007   Asthma 04/23/2007   Renal stone 04/23/2007   OSTEOPENIA 04/23/2007   CARCINOMA, RENAL CELL 08/10/1999    Conditions to be addressed/monitored:CHF, HTN, DMII, CKD and pulmonary HTN  Care Plan : Cardiovascular disease  Updates made by ,  M, RN since 08/16/2021 12:00 AM     Problem: lack of long term care plan for managment of cardiovasculare disease in a patient with CHF, HTN, DM, pulmonary HTN   Priority: Medium     Long-Range Goal: Symptom Exacerbation Prevented or Minimized   Start Date: 06/27/2021  Expected End Date: 12/28/2021  This Visit's Progress: On track  Recent Progress: On track  Priority: Medium  Note:   Objective:    Last practice recorded BP readings:  BP Readings from Last 3 Encounters:  08/15/21 118/68  07/30/21 (!) 149/72  07/20/21 (!) 153/66  Most recent eGFR/CrCl:  Lab Results  Component Value Date   EGFR 35 (L) 07/20/2021  Current Barriers: admission 05/25/21-05/29/21 with congestive heart failure and AKI. ED visit for acute on chronic heart failure on 07/04/21. ED visit for acute cystitis on 07/29/21. Patient with history of HTN, Pulmonary Hypertension with chronic respiratory failure, chronic renal disease. Continues to use oxygen at 3L/[place]. pulmonologist appointment completed on 08/15/21-plans for sleep study and PFT's. Weight today 198 lbs; weight was 200 pounds during last telephone assessment. She denies any signs/symptoms of heart failure  exacerbation at this time. She states she remains active with [facility] making weekly visits.     Knowledge deficit related to long term care plan for self-management of cardiac disease in a patient with CHF, HTN, AKI, Pulmonary HTN, renal cell carcinoma and liver cirrhosis. Transportation Barriers-does not have consistent transportation to her appointments.   Case Manager Clinical Goal(s):  patient will weigh self daily and record patient will verbalize understanding of Heart Failure Action Plan and when to call doctor patient will take all cardiac mediations as prescribed Patient will check blood pressure as recommended by providers Interventions:  Collaboration with Carollee Herter, Alferd Apa, DO regarding development and update of comprehensive plan of care as evidenced by provider attestation and co-signature Inter-disciplinary care team collaboration (see longitudinal plan of care) Reinforced importance of low salt diet.  Discussed how to get most accurate reading when taking blood pressures Reviewed Heart Failure Action Plan Medications reviewed. RNCM reinforced importance of taking medications as prescribed. She states she is taking Vytorin until it runs out and that she has not picked up ezetimibe or simvastatin from the pharmacy yet.  Reviewed upcoming appointment with patient. Nephrologist 08/03/21 [facility]; pulmonologist 08/15/21. Cardiologist 10/31/21 Collaboration with clinical pharmacist regarding medications Discussed transportation with patient who reports she currently has transportation to her appointments. Encouraged client to continue to check and record blood pressure readings and to follow up with cardiologist and/or primary care provider if it does not improve or worsens. Discussed plans with patient for ongoing care management follow up and provided patient with direct contact information for care management team Patient Goals: - develop a rescue plan  and follow rescue plan if symptoms flare-up - know when to call the doctor - track symptoms and what helps feel better or worse - weigh and record your weights daily. Call office if you gain more than 3 pounds in one day or 5 pounds in one week, have increased swelling in stomach, hands feet or legs - Know when to call the doctor: increased weight gain more than 3 pounds overnight or 5 pounds in a week, increased swelling in stomach, hands, feet or legs, increased shortness of breath, if it is harder for you to breathe when lying down and need to sit up to breath, dizziness, chest discomfort heaviness or pain or you feel that something is not right.  - continue to monitor and record blood pressure and call if your blood pressure is outside of recommended parameters - continue to take your medications as prescribed by your doctor - continue to attend provider visits as recommended/scheduled - Call your nurse care coordinator if you continue to have difficulty getting transportation to your appointments. - continue to work with the Care Management team for ongoing care coordination/disease management needs. Follow Up Plan:  Telephone follow up appointment with care management team member scheduled for: next month The patient has been provided with contact information for the care management team and has been advised to call with any health related questions or concerns.      Care Plan : Diabetes Type 2 (Adult)  Updates made by ,  M, RN since 08/16/2021 12:00 AM     Problem: Glycemic Management (Diabetes, Type 2)   Priority: Medium     Long-Range Goal: Glycemic Management Optimized   Start Date: 06/27/2021  Expected End Date: 12/28/2021  This Visit's Progress: On track  Recent Progress: On track  Priority: Medium  Note:   Objective:  Lab Results  Component Value Date   HGBA1C 6.3 (H) 05/25/2021   Lab Results  Component Value Date   CREATININE 1.46 (H) 07/20/2021   CREATININE  1.31 (H) 07/04/2021   CREATININE 1.30 (H) 06/09/2021  Current Barriers:  Knowledge Deficits related to long term care plan for self-management of Diabetes. Patient without any questions or concerns at this time. Blood sugar this morning was 137 ac and 151 yesterday. She remains active with home health [facility].  Case Manager Clinical Goal(s):  patient will demonstrate improved adherence to prescribed treatment plan for diabetes self care/management as evidenced by: improved daily monitoring and recording of CBG  adherence to ADA/ carb modified diet adherence to prescribed medication regimen Interventions:  Collaboration with Lowne Chase, Yvonne R, DO regarding development and update of comprehensive plan of care as evidenced by provider attestation and co-signature Inter-disciplinary care team collaboration (see longitudinal plan of care) Medications reviewed. Patient reports she does not have Medicare part D plan. Discussed importance of reaching out to clinical pharmacist if she has any difficulty obtaining medications. Collaboration with clinical pharmacist as needed re: follow up appointment with clinical pharmacist. Reviewed scheduled/upcoming provider appointments. Collaboration with clinical pharmacist regarding medications. RNCM encouraged patient to continue to check blood sugar and record as recommended.  Reiterated FBS goal 80-130; goal after meal < 180. Discussed plans with patient for ongoing care management follow up and provided patient with direct contact information for care management team Patient Goals: - continue to check blood sugar at recommended times and check blood sugar if you feel it is too high or too low - take the blood sugar log and meter to all doctor visits - continue to take your medications as prescribed. - continue to attend provider visits as recommended - - continue to eat healthy meals: eat plenty of vegetables, fruits, lean meats; monitor your fat intake;  limit: sugars, rice, potatoes, pasta Follow Up Plan: Telephone follow up appointment with care management team member scheduled for: next month    Plan:Telephone follow up appointment with care management team member scheduled for:  next month and The patient has been provided with contact information for the care management team and has been advised to call with any health related questions or concerns.    , RN, MSN, BSN, CCM Care Management Coordinator [facility] [phone]         

## 2021-08-16 NOTE — Patient Instructions (Signed)
Visit Information  PATIENT GOALS:  Goals Addressed             This Visit's Progress    Monitor and Manage My Blood Sugar-Diabetes Type 2   On track    Timeframe:  Long-Range Goal Priority:  Medium Start Date:  06/27/2021                           Expected End Date: 12/28/2021                      Follow Up Date 09/27/21   Patient Goals: - continue to check blood sugar at recommended times and check blood sugar if you feel it is too high or too low - take the blood sugar log and meter to all doctor visits - continue to take your medications as prescribed. - continue to attend provider visits as recommended - - continue to eat healthy meals: eat plenty of vegetables, fruits, lean meats; monitor your fat intake; limit: sugars, rice, potatoes, pasta     Track and Manage Symptoms-Heart Failure/health conditions       Timeframe:  Long-Range Goal Priority:  Medium Start Date:  06/27/2021                           Expected End Date:  12/28/2021                     Follow Up Date 09/27/21   Patient Goals: - develop a rescue plan and follow rescue plan if symptoms flare-up - know when to call the doctor - track symptoms and what helps feel better or worse - weigh and record your weights daily. Call office if you gain more than 3 pounds in one day or 5 pounds in one week, have increased swelling in stomach, hands feet or legs - Know when to call the doctor: increased weight gain more than 3 pounds overnight or 5 pounds in a week, increased swelling in stomach, hands, feet or legs, increased shortness of breath, if it is harder for you to breathe when lying down and need to sit up to breath, dizziness, chest discomfort heaviness or pain or you feel that something is not right.  - continue to monitor and record blood pressure and call if your blood pressure is outside of recommended parameters - continue to take your medications as prescribed by your doctor - continue to attend provider  visits as recommended/scheduled - Call your nurse care coordinator if you continue to have difficulty getting transportation to your appointments. - continue to work with the Care Management team for ongoing care coordination/disease management needs.        The patient verbalized understanding of instructions, educational materials, and care plan provided today and agreed to receive a mailed copy of patient instructions, educational materials, and care plan.   Telephone follow up appointment with care management team member scheduled for:09/27/21 The patient has been provided with contact information for the care management team and has been advised to call with any health related questions or concerns.   Thea Silversmith, RN, MSN, BSN, CCM Care Management Coordinator [facility] [phone]

## 2021-08-18 ENCOUNTER — Telehealth: Payer: Medicare Other

## 2021-08-19 ENCOUNTER — Telehealth: Payer: Self-pay | Admitting: Family Medicine

## 2021-08-19 NOTE — Telephone Encounter (Signed)
[place]: Margaret Dyer : [id number]  Missed a visit due to daughters schedule.Agreed to have visit next week.

## 2021-08-23 ENCOUNTER — Telehealth: Payer: Medicare Other

## 2021-08-23 ENCOUNTER — Telehealth: Payer: Self-pay | Admitting: Pharmacist

## 2021-08-23 NOTE — Telephone Encounter (Signed)
Attempted to contact patient for Chronic Care Management follow up phone visit. Unable to reach patient. LM on Vm with my contact number [phone]

## 2021-08-26 ENCOUNTER — Ambulatory Visit: Payer: Medicare Other | Admitting: Pharmacist

## 2021-08-26 DIAGNOSIS — I2510 Atherosclerotic heart disease of native coronary artery without angina pectoris: Secondary | ICD-10-CM | POA: Diagnosis not present

## 2021-08-26 DIAGNOSIS — E785 Hyperlipidemia, unspecified: Secondary | ICD-10-CM

## 2021-08-26 DIAGNOSIS — I50810 Right heart failure, unspecified: Secondary | ICD-10-CM | POA: Diagnosis not present

## 2021-08-26 DIAGNOSIS — Z794 Long term (current) use of insulin: Secondary | ICD-10-CM

## 2021-08-26 DIAGNOSIS — I10 Essential (primary) hypertension: Secondary | ICD-10-CM

## 2021-08-26 DIAGNOSIS — I5033 Acute on chronic diastolic (congestive) heart failure: Secondary | ICD-10-CM | POA: Diagnosis not present

## 2021-08-26 DIAGNOSIS — N1832 Chronic kidney disease, stage 3b: Secondary | ICD-10-CM | POA: Diagnosis not present

## 2021-08-26 DIAGNOSIS — E1122 Type 2 diabetes mellitus with diabetic chronic kidney disease: Secondary | ICD-10-CM | POA: Diagnosis not present

## 2021-08-26 DIAGNOSIS — I130 Hypertensive heart and chronic kidney disease with heart failure and stage 1 through stage 4 chronic kidney disease, or unspecified chronic kidney disease: Secondary | ICD-10-CM | POA: Diagnosis not present

## 2021-08-26 NOTE — Progress Notes (Signed)
Her labs were very good 2 months ago on the current combination - would be hesitant to change her for cost issues as she may have side-effects with the higher dose crestor monotherapy. Would advise she try other pharmacies or discount card options.  Dr. Debara Pickett

## 2021-08-26 NOTE — Chronic Care Management (AMB) (Signed)
Chronic Care Management Pharmacy Note  08/26/2021 Name:  Margaret Dyer MRN:  [id number] DOB:  [birth year]  Summary: Does not have Part D coverage CHF improving - monitored by Dr Debara Pickett LDL slightly above goal - taking last shipment of Vytorin from patient assistance. States separate generic simvastatin + ezetimibe too costly.  Diabetes - controlled - monitored by [facility] endo.  Recommendations/Changes made from today's visit: Encouraged patient to review information about Part D plans and [place] [facility] to help with medication costs.  Reviewed s/s of worsening CHF and when to contact provider. Continue to follow up with Dr Debara Pickett Will reach out to PCP and Dr Debara Pickett regarding treatment for hypelipidemia   Subjective: Margaret Dyer is an 86 y.o. year old female who is a primary patient of Margaret Held, DO.  The CCM team was consulted for assistance with disease management and care coordination needs.    Engaged with patient by telephone for follow up visit in response to provider referral for pharmacy case management and/or care coordination services.   Consent to Services:  The patient was given information about Chronic Care Management services, agreed to services, and gave verbal consent prior to initiation of services.  Please see initial visit note for detailed documentation.   Patient Care Team: Carollee Herter, Alferd Apa, DO as PCP - General Debara Pickett Margaret Corwin, MD as PCP - Cardiology (Cardiology) Riccardo Dubin, PA-C as Diabetes Educator (Internal Medicine) Luretha Rued, RN as Case Manager Cherre Robins, PharmD (Pharmacist)  Recent office visits: 06/09/21-PCP (Dr Carollee Herter) Seen for hospital follow up. Labs ordered. Referral to [facility] and Nephrology. Follow up in 6 months.  Recent consult visits: 08/15/2021 - Pulmonology (Dr Loanne Drilling) Shortness of Breath. Started Duonebs twice a day. Continue to use supplemental O2 at 3L with activity and  sleep. Ordered PFTs and split night sleep study 08/03/2021 - nephrology at [facility]. Seen for CKD3b with AKI during recent hospitalization. History of left nephrectomy. No medication changes noted. Scr had returned to baseline of 1.2 to 1.5. 07/20/2021 - Cardio (Dr Debara Pickett) F/U edema and SOB. Incresaed torsemide to 56m qam and 291mmidday; increaesed hydralazine to 5062mvery 8 hours 07/11/21-Endocrinology (Margaret JonRonnald RampA-C.) Follow up visit. Hypothyroidism: Adjust T4 to 89m70m tab Mon-Fri. Follow up in 6 weeks for labs. 04/08/21-Cardiology (Margaret Dyer) [place]. Start on amlodipine 5 mg twice a day for now. Ambulatory referral to ENT. Follow up in 1 month. 03/28/21-Oncology (Dr EnneMarin Olpitial visit. Follow up in 3 months  [facility]. Prescribed ciprofloxacin 250mg46mry 12 hours 07/04/21 to [facility].    New?[facility]:?? None noted Medication Changes at Hospital Discharge: None noted [facility]   05/25/21 to [facility] Short[facility] of breath. [facility]??torsemide 20mg 46my Medication Changes at Hospital Discharge: None note Medications Discontinued at Hospital Discharge:  amlodipine, furosemide, losartan, spironolactone, Bactrim DS   03/29/21 [facility] changes noted  Objective:  Lab Results  Component Value Date   CREATININE 1.46 (H) 07/20/2021   CREATININE 1.31 (H) 07/04/2021   CREATININE 1.30 (H) 06/09/2021    Lab Results  Component Value Date   HGBA1C 6.3 (H)  05/25/2021   Last diabetic Eye exam: No results found for: HMDIABEYEEXA  Last diabetic Foot exam: No results found for:  HMDIABFOOTEX      Component Value Date/Time   CHOL 132 06/09/2021 1511   CHOL 122 11/08/2020 1638   TRIG 81.0 06/09/2021 1511   HDL 44.40 06/09/2021 1511   HDL 45 11/08/2020 1638   CHOLHDL 3 06/09/2021 1511   VLDL 16.2 06/09/2021 1511   LDLCALC 72 06/09/2021 1511   LDLCALC 58 11/08/2020 1638   LDLDIRECT 89.0 01/19/2015 1659    Hepatic Function Latest Ref Rng & Units 07/20/2021 06/09/2021 05/29/2021  Total Protein 6.0 - 8.5 g/dL 6.5 6.5 -  Albumin 3.6 - 4.6 g/dL 4.1 4.0 3.2(L)  AST 0 - 40 IU/L 24 21 -  ALT 0 - 32 IU/L 21 23 -  Alk Phosphatase 44 - 121 IU/L 63 49 -  Total Bilirubin 0.0 - 1.2 mg/dL 0.4 0.6 -  Bilirubin, Direct 0.1 - 0.5 mg/dL - - -    Lab Results  Component Value Date/Time   TSH 0.243 (L) 05/26/2021 04:45 AM   TSH 0.616 11/08/2020 04:38 PM   TSH 1.197 07/04/2019 04:28 AM   TSH 0.559 05/08/2014 03:35 PM   TSH 0.401 03/29/2012 03:34 PM    CBC Latest Ref Rng & Units 07/20/2021 07/04/2021 06/09/2021  WBC 3.4 - 10.8 x10E3/uL 5.9 6.9 6.7  Hemoglobin 11.1 - 15.9 g/dL 10.0(L) 10.0(L) 11.2(L)  Hematocrit 34.0 - 46.6 % 31.1(L) 32.1(L) 32.7(L)  Platelets 150 - 450 x10E3/uL 104(L) PLATELET CLUMPS NOTED ON SMEAR, UNABLE TO ESTIMATE 105.0(L)    Lab Results  Component Value Date/Time   VD25OH 21 (L) 05/08/2014 03:36 PM    Clinical ASCVD: Yes  The ASCVD Risk score (Arnett DK, et al., 2019) failed to calculate for the following reasons:   The 2019 ASCVD risk score is only valid for ages 67 to 47     Social History   Tobacco Use  Smoking Status Former  Smokeless Tobacco Never  Tobacco Comments   05/30/18 none in 40 years   BP Readings from Last 3 Encounters:  08/15/21 118/68  07/30/21 (!) 149/72  07/20/21 (!) 153/66   Pulse Readings from Last 3 Encounters:  08/15/21 70  07/30/21 62  07/20/21 60   Wt Readings from Last 3 Encounters:  08/15/21 199 lb 12.8 oz (90.6 kg)  07/29/21 206 lb 9.6 oz (93.7 kg)  07/20/21 206 lb 9.6 oz (93.7 kg)    Assessment: Review  of patient past medical history, allergies, medications, health status, including review of consultants reports, laboratory and other test data, was performed as part of comprehensive evaluation and provision of chronic care management services.   SDOH:  (Social Determinants of Health) assessments and interventions performed:     CCM Care Plan  Allergies  Allergen Reactions   Other Other (See Comments)    Patient has hemorrhaged at least 4 times in her lifetime   Bactrim [Sulfamethoxazole-Trimethoprim]    Cefuroxime Axetil Nausea Only    Upset stomach    Ciprofloxacin Nausea Only    Upset stomach    Medications Reviewed Today     Reviewed by Luretha Rued, RN (Registered Nurse) on 08/16/21 at 1541  Med List Status: <None>   Medication Order Taking? Sig Documenting Provider Last Dose Status Informant  albuterol (PROVENTIL) (2.5 MG/3ML) 0.083% nebulizer solution [id number] Yes Take 3 mLs (2.5 mg total) by nebulization every 6 (six) hours as needed for wheezing or shortness of breath.  Margaret Held, DO Taking Active   aspirin EC 81 MG EC tablet [id number]  Take 1 tablet (81 mg total) by mouth daily. Geradine Girt, DO  Active Self  docusate sodium (COLACE) 100 MG capsule [id number] Yes Take 1 capsule (100 mg total) by mouth 2 (two) times daily. Bonnell Public, MD Taking Active Self  ezetimibe (ZETIA) 10 MG tablet [id number] No Take 1 tablet (10 mg total) by mouth daily.  Patient not taking: No sig reported   Margaret Held, DO Not Taking Active   Ezetimibe-Simvastatin (VYTORIN PO) [id number] Yes Take 1 tablet by mouth daily. [provider] Taking Active Self  hydrALAZINE (APRESOLINE) 50 MG tablet [id number] Yes Take 1 tablet (50 mg total) by mouth every 8 (eight) hours. Pixie Casino, MD Taking Active   insulin glargine (LANTUS) 100 UNIT/ML injection [id number] Yes Inject 0.25 mLs (25 Units total) into the skin at bedtime. Geradine Girt, DO Taking Active  Self  Insulin Glulisine (APIDRA SOLOSTAR) 100 UNIT/ML Solostar Pen [id number] Yes Inject 10-15 Units into the skin 3 (three) times daily before meals.  [provider] Taking Active Self  ipratropium-albuterol (DUONEB) 0.5-2.5 (3) MG/3ML SOLN [id number] Yes Take 3 mLs by nebulization 3 (three) times daily. Margaretha Seeds, MD Taking Active   levothyroxine (SYNTHROID, LEVOTHROID) 25 MCG tablet [id number] Yes take 1 tablet by mouth daily  Patient taking differently: Take 25 mcg by mouth. Daily except none on Saturdays and Sundays   Carollee Herter, Alferd Apa, DO Taking Active            Med Note Richardson Landry, Linton Rump   Wed Jul 20, 2021  3:28 PM)    LORazepam (ATIVAN) 0.5 MG tablet [id number] Yes Take 1 tablet (0.5 mg total) by mouth daily as needed for anxiety. Roma Schanz R, DO Taking Active   metoprolol tartrate (LOPRESSOR) 25 MG tablet [id number] Yes Take 1 tablet (25 mg total) by mouth 2 (two) times daily. Pixie Casino, MD Taking Active Self  Multiple Vitamins-Minerals (CENTRUM SILVER ULTRA WOMENS PO) [id number] Yes Take 1 tablet by mouth at bedtime.  [provider] Taking Active Self  oxybutynin (DITROPAN) 5 MG tablet [id number] Yes TAKE 1 TABLET BY MOUTH DAILY Carollee Herter, Alferd Apa, DO Taking Active Self  polyethylene glycol (MIRALAX / GLYCOLAX) 17 g packet [id number] Yes Take 17 g by mouth daily as needed. [provider] Taking Active Self  potassium chloride SA (KLOR-CON) 20 MEQ tablet [id number] Yes TAKE 1 TABLET BY MOUTH DAILY AT BEDTIME Hilty, Margaret Corwin, MD Taking Active Self  simvastatin (ZOCOR) 40 MG tablet [id number] No Take 1 tablet (40 mg total) by mouth at bedtime.  Patient not taking: No sig reported   Carollee Herter, Alferd Apa, DO Not Taking Active   sucralfate (CARAFATE) 1 g tablet [id number] Yes Take 1 tablet (1 g total) by mouth 4 (four) times daily -  with meals and at bedtime.  Patient taking differently: Take 1 g by mouth daily as needed.   Margaret Held, DO Taking Active   torsemide (DEMADEX) 20 MG tablet [id number] Yes Take 2 tablets (80m) by mouth in the morning and 1 tablet (273m in the afternoon. HiPixie CasinoMD Taking Active   traMADol (UVeatrice Bourbon50 MG tablet [id number] Take 1 tablet (50 mg total) by mouth every 6 (six) hours as needed for moderate pain. LoAnn HeldDO Taking Active  Patient Active Problem List   Diagnosis Date Noted   Uncontrolled type 2 diabetes mellitus with hyperglycemia ([place]) 06/09/2021   Chronic renal disease, stage 4, severely decreased glomerular filtration rate (GFR) between 15-29 mL/min/1.73 square meter ([place]) 06/09/2021   CHF exacerbation ([place]) 05/25/2021   Acute on chronic diastolic CHF (congestive heart failure) ([place]) 05/25/2021   Closed fracture of maxillary sinus ([place]) 07/10/2019   UTI (urinary tract infection) 07/03/2019   Food poisoning 07/03/2019   Bacteremia 07/03/2019   Exposure to COVID-19 virus 06/16/2019   Closed left hip fracture ([place]) 12/24/2018   Dysuria 12/17/2017   Dizziness 12/17/2017   Community acquired pneumonia 12/17/2017   Pneumonia 12/01/2017   Urinary incontinence 11/09/2017   Tic 11/09/2017   Pulmonary hypertension ([place]) 09/12/2017   Chronic diastolic CHF (congestive heart failure) ([place]) 06/05/2017   Oral candida 05/29/2017   Vitamin D deficiency 05/29/2017   Chronic respiratory failure ([place]) 05/24/2017   Anxiety 05/24/2017   HCAP (healthcare-associated pneumonia) 05/22/2017   Right heart failure ([place]) 33/35/4562   Acute diastolic CHF (congestive heart failure) (HCC)    Exertional dyspnea 05/13/2017   Hypertension 05/13/2017   Asthma 05/13/2017   DOE (dyspnea on exertion) 05/13/2017   Diabetes mellitus type 2, insulin dependent ([place]) 05/13/2017   Acute kidney injury ([place]) 05/13/2017   History of nephrectomy 05/13/2017   Hyperlipidemia LDL goal <70 05/13/2017   Liver cirrhosis secondary to NASH ([place]) 05/13/2017    Thrombocytopenia ([place]) 05/13/2017   Renal cell carcinoma 05/13/2017   Hypothyroidism 05/13/2017   Hypersplenism 05/13/2017   Osteoarthritis 05/13/2017   Overactive bladder 05/13/2017   URI, acute 02/22/2017   Abrasion, left knee, initial encounter 02/22/2017   CTS (carpal tunnel syndrome) 03/04/2016   Bilateral contusion of ribs 10/05/2015   Dental abscess 04/15/2014   Claudication, intermittent ([place]) 12/05/2013   Obesity (BMI 30-39.9) 07/22/2013   Back pain 05/26/2013   Platelets decreased ([place]) 05/26/2013   Breast pain, right 04/21/2013   Anxiety and depression 12/05/2012   Bronchitis 11/06/2012   HEMATURIA UNSPECIFIED 02/17/2011   Hyperlipidemia 09/08/2010   B12 DEFICIENCY 07/22/2010   DIZZINESS 07/04/2010   Essential hypertension 06/21/2010   GOUT, UNSPECIFIED 01/27/2010   NEPHRECTOMY, HX OF 12/02/2009   NECK PAIN, LEFT 09/04/2008   PARESTHESIA 09/02/2007   GOITER NOS 04/23/2007   Diabetes mellitus ([place]) 04/23/2007   Asthma 04/23/2007   Renal stone 04/23/2007   OSTEOPENIA 04/23/2007   CARCINOMA, RENAL CELL 08/10/1999    Immunization History  Administered Date(s) Administered   Influenza Split 09/29/2011   Influenza Whole 09/04/1999, 09/20/2007, 09/04/2008, 01/18/2010, 09/19/2010   Influenza, High Dose Seasonal PF 01/05/2014, 09/23/2015, 10/24/2016, 11/09/2017   Influenza, Seasonal, Injecte, Preservative Fre 12/05/2012   Influenza,inj,Quad PF,6+ Mos 10/27/2014   Influenza-Unspecified 10/04/2018   Moderna Sars-Covid-2 Vaccination 02/12/2020, 03/22/2020, 11/06/2020   Pneumococcal Conjugate-13 05/28/2017   Tdap 10/24/2016    Conditions to be addressed/monitored: CHF, HTN, HLD, DMII, Anxiety, and GERD, hypothyroidism; OAB  Care Plan : Cardiovascular disease  Updates made by Cherre Robins, PHARMD since 08/26/2021 12:00 AM     Problem: lack of long term care plan for managment of cardiovasculare disease in a patient with CHF, HTN, DM, pulmonary HTN   Priority:  Medium     Long-Range Goal: Symptom Exacerbation Prevented or Minimized   Start Date: 06/27/2021  Expected End Date: 12/28/2021  Recent Progress: On track  Priority: Medium  Note:   Objective:  Last practice recorded BP readings:  BP Readings from Last 3 Encounters:  08/15/21  118/68  07/30/21 (!) 149/72  07/20/21 (!) 153/66  Most recent eGFR/CrCl:  Lab Results  Component Value Date   EGFR 35 (L) 07/20/2021  Current Barriers: admission 05/25/21-05/29/21 with congestive heart failure and AKI. ED visit for acute on chronic heart failure on 07/04/21. ED visit for acute cystitis on 07/29/21. Patient with history of HTN, Pulmonary Hypertension with chronic respiratory failure, chronic renal disease. Continues to use oxygen at 3L/[place]. pulmonologist appointment completed on 08/15/21-plans for sleep study and PFT's. Weight today 198 lbs; weight was 200 pounds during last telephone assessment. She denies any signs/symptoms of heart failure exacerbation at this time. She states she remains active with [facility] making weekly visits.     Knowledge deficit related to long term care plan for self-management of cardiac disease in a patient with CHF, HTN, AKI, Pulmonary HTN, renal cell carcinoma and liver cirrhosis. Transportation Barriers-does not have consistent transportation to her appointments.   Case Manager Clinical Goal(s):  patient will weigh self daily and record patient will verbalize understanding of Heart Failure Action Plan and when to call doctor patient will take all cardiac mediations as prescribed Patient will check blood pressure as recommended by providers Interventions:  Collaboration with Carollee Herter, Alferd Apa, DO regarding development and update of comprehensive plan of care as evidenced by provider attestation and co-signature Inter-disciplinary care team collaboration (see longitudinal plan of care) Reinforced importance of low salt diet.  Discussed how to get most  accurate reading when taking blood pressures Reviewed Heart Failure Action Plan Medications reviewed. RNCM reinforced importance of taking medications as prescribed. She states she is taking Vytorin until it runs out and that she has not picked up ezetimibe or simvastatin from the pharmacy yet.  Reviewed upcoming appointment with patient. Nephrologist 08/03/21 [facility]; pulmonologist 08/15/21. Cardiologist 10/31/21 Collaboration with clinical pharmacist regarding medications Discussed transportation with patient who reports she currently has transportation to her appointments. Encouraged client to continue to check and record blood pressure readings and to follow up with cardiologist and/or primary care provider if it does not improve or worsens. Discussed plans with patient for ongoing care management follow up and provided patient with direct contact information for care management team Patient Goals: - develop a rescue plan and follow rescue plan if symptoms flare-up - know when to call the doctor - track symptoms and what helps feel better or worse - weigh and record your weights daily. Call office if you gain more than 3 pounds in one day or 5 pounds in one week, have increased swelling in stomach, hands feet or legs - Know when to call the doctor: increased weight gain more than 3 pounds overnight or 5 pounds in a week, increased swelling in stomach, hands, feet or legs, increased shortness of breath, if it is harder for you to breathe when lying down and need to sit up to breath, dizziness, chest discomfort heaviness or pain or you feel that something is not right.  - continue to monitor and record blood pressure and call if your blood pressure is outside of recommended parameters - continue to take your medications as prescribed by your doctor - continue to attend provider visits as recommended/scheduled - Call your nurse care coordinator if you continue to have difficulty getting  transportation to your appointments. - continue to work with the Care Management team for ongoing care coordination/disease management needs. Follow Up Plan: Telephone follow up appointment with care management team member scheduled for: next month The  patient has been provided with contact information for the care management team and has been advised to call with any health related questions or concerns.      Care Plan : General Pharmacy (Adult)  Updates made by Cherre Robins, PHARMD since 08/26/2021 12:00 AM     Problem: Chronic conditions: type 2 DM, insulin required; hyperlipidemia; CHD, HTN; OAB; GERD; chronic pain   Priority: High  Onset Date: 07/26/2021     Long-Range Goal: Management of chronic conditions and medications   Start Date: 07/26/2021  Priority: High  Note:   Current Barriers:  Unable to independently afford treatment regimen Unable to maintain control of CHF Frequent visits to ED in the last 6 months  Pharmacist Clinical Goal(s):  Over the next 90 days, patient will maintain control of Type 2 diabetes, HTN, CHF and hyperlipidemia as evidenced by attaining disease specific goals listed below  adhere to prescribed medication regimen as evidenced by medication refill records contact provider office for questions/concerns as evidenced notation of same in electronic health record Consider programs to help medication costs and assess possibility of signing up for a Medicare Part D plan  through collaboration with PharmD and provider.   Interventions: 1:1 collaboration with Carollee Herter, Alferd Apa, DO regarding development and update of comprehensive plan of care as evidenced by provider attestation and co-signature Inter-disciplinary care team collaboration (see longitudinal plan of care) Comprehensive medication review performed; medication list updated in electronic medical record  Diabetes: Controlled; A1c goal < 7.0% Managed by Endocrinology Clinic with [facility] -  [place]i [place], [facility] Current treatment: Lantus 20 units injected into skin once a day Apidra 10 to 15 units injected into skin prior to meals 3 times a day per sliding scale / blood glucose reading Current glucose readings: fasting glucose: 71 to 115, post prandial glucose: 150 - 180 Reports hypoglycemia twice in the last month. Patient treated with glass of OJ.  Patient receives insulin through patient assistance program from her endo office.  Interventions:  Discussed A1c goal Reviewed home blood glucose readings and reviewed goals  Fasting blood glucose goal (before meals) = 80 to 130 Blood glucose goal after a meal = less than 180  Continue current regimen for diabetes and to follow up with endocrinology office Continue to check blood glucose 3 to 4 times a day and record for future visits  Hypertension / CHF: Controlled per blood pressure at last office visit and was also at goal during visit to nephrologist 08/03/2021 Blood pressure  goal <140/90 CHF goal: decreased symptoms such as edema and shortness of breath and to prevent exacerbations / hospitalizations Followed by Dr Debara Pickett and [facility] BP Readings from Last 3 Encounters:  08/15/21 118/68  07/30/21 (!) 149/72  07/20/21 (!) 153/66  Current treatment: Hydralazine 78m every 8 hours (increased by Dr HDebara Pickett08/17/2022) Metoprolol tartrate 245mtwice a day  Torsemide 2068m take 2 tablets = 43m60mch morning and 1 tablet = 20mg77mday (increased by Dr HiltyDebara Pickett2/2022) Current home readings: 122/72 today BNP was 321 (07/04/2021); Decreased to 172 (07/20/2021) Interventions:  Recommended checking blood pressure daily and record for future visits Discussed signs and symptoms of CHF exacerbation - weight gain, SOB, abdominal fullness, swelling in legs or abdomen, Fatigue and weakness, changes in ability to perform usual activities, persistent cough or wheezing with white or pink blood-tinged mucus, nausea and lack of  appetite Continue to weigh daily - report weight gain of more than 3 lbs in 24 hours  or 5 lbs in 1 week or signs listed above to provider Continue to follow up with Dr Debara Pickett  Hyperlipidemia Not at goal. Last LDL was 72 LDL goal is <70 Current therapy: Vytorin 10/40mg daily Previously tried atorvastatin - stopped due to myalgias Patient has been getting Vytorin from patient assistance program but has been notified that the program will no longer be able to supply Vytorin.  A Prescription for the 2 components in Vytorin - ezetimibe 58m and simvastatin 465mwas sent by Dr LoCarollee Hertero [facility] but patient is concerned about cost. She states will be $60 - she was not sure if this was for 30 or 90 days supply. Contacted [facility] and this cost if for 90 days.  I sent information about Part D plans and [place] [facility] program after last phone visit with patient but she has not reviewed information yet.  Interventions:  Encouraged patient to review information I sent to her. If she would like to discuss face to face, I offered to see her in October when she is in building to see Dr EnMarin Olp Another option is to change to rosuvastatin 20 or 4065maily - 90 day supply is about $26.  Will send message to Dr HilDebara Pickettd Dr LowEtter Sjogren Medication Management:  Patient does not have a Medicare Part D plan. States that cost is too much and she has a lot of bills.  She current gets insulin from patient assistance program. All other medications she gets 30 days supply at [facility] pays cash price for.  She does not drive and states that she prefers to keep getting [facility] for [facility] daughter to pick up for her.  She did mention she was getting Vytorin (simvastatin+ezetimibe) from patient assistance but [facility] no longer provides in their program since is now generic.  Reviewed refill history. Patient appears to be filling  medications on time.  Interventions:  Evaluated for Low Income subsidy - does not qualify Performed Medicare plan review at last visti, through Medicare.gov site. Mailed information to patient about the 2 lowest cost plans but she has not reviewed yet.  Also at last visit discussed [place] [facility] program. She might qualify to get a few of her medications for free. Information was mailed at last visit but patient has not reviewed yet. Patient is not sure she wants to get medication through mail.  Patient Goals/Self-Care Activities Over the next 90 days, patient will:  take medications as prescribed, focus on medication adherence by refilling needed medications and using pill boxes, check glucose 3 to 4 times a day, document, and provide at future appointments, check blood pressure daily, document, and provide at future appointments, weigh daily, and contact provider if weight gain of 3 pounds in 24 hours or 5 pounds in 1 week, and collaborate with provider on medication access solutions  Follow Up Plan: Telephone follow up appointment with care management team member scheduled for:  2 weeks       Medication Assistance:  Getting Lantus and Apidra through ednocrinology office from patient assistance program.  All other medications are generic. Sent patient information on Medicare Part D plans and [place] [facility] Assist program  [facility]:  [facility] [id number]: [phone]: [phone]  [address] [address] Phone: [phone] Fax: [phone]  Uses pill box? No -   Pt endorses 95% compliance  Follow Up:  Patient agrees to Care Plan and Follow-up.  Plan: Telephone follow up appointment with care management team member scheduled  for:  2 weeks  Cherre Robins, PharmD Clinical Pharmacist [facility]

## 2021-08-26 NOTE — Patient Instructions (Signed)
Visit Information  PATIENT GOALS:  Goals Addressed             This Visit's Progress    Chronic Care Mangement Pharmacy Care Plan       Current Barriers:  Cost of medication high Unable to maintain control of heart disease, blood pressure Frequent visits to emergency department recently  Pharmacist Clinical Goal(s):  Over the next 90 days, patient will maintain / attain control of diabetes, blood pressure and cholesterol as evidenced by attaining disease specific goals listed below  adhere to prescribed medication regimen as evidenced by medication refill records and attaining goal listed below contact provider office for questions/concerns as evidenced notation of same in electronic health record Consider programs to help with medication costs and assess possibility of signing up for a Medicare Part D plan through collaboration with PharmD and provider.   Interventions: 1:1 collaboration with Carollee Herter, Alferd Apa, DO regarding development and update of comprehensive plan of care as evidenced by provider attestation and co-signature Inter-disciplinary care team collaboration (see longitudinal plan of care) Comprehensive medication review performed; medication list updated in electronic medical record  Diabetes: A1c goal < 7.0% Lab Results  Component Value Date   HGBA1C 6.3 (H) 05/25/2021  Current treatment: Lantus 20 units injected into skin once a day Apidra 10 to 15 units injected into skin prior to meals 3 times a day per sliding scale / blood glucose reading Interventions:  Discussed A1c goal Reviewed home blood glucose readings and reviewed goals  Fasting blood glucose goal (before meals) = 80 to 130 Blood glucose goal after a meal = less than 180  Continue current regimen for diabetes and follow up with endocrinology office Continue to check blood glucose 3 to 4 times a day and record for future visits  Hypertension / Congestive Heart Disease: Goals: BP  <140/90,  decreased symptoms of congestive heart disease such as edema and shortness of breath and to prevent exacerbations / hospitalizations BP Readings from Last 3 Encounters:  07/20/21 (!) 153/66  07/14/21 (!) 161/76  07/05/21 (!) 142/62  Current treatment: Hydralazine 65m 3 times a day (increased by Dr HDebara Pickett08/17/2022) Metoprolol tartrate 230mtwice a day  Torsemide 2057m take 2 tablets = 57m24mch morning and 1 tablet = 20mg55mday (increased by Dr HiltyDebara Pickett2/2022) Interventions:  Recommended checking blood pressure daily and record reading for future visits Discussed signs and symptoms of CHF exacerbation - weight gain, SOB, abdominal fullness, swelling in legs or abdomen, Fatigue and weakness, changes in ability to perform usual activities, persistent cough or wheezing with white or pink blood-tinged mucus, nausea and lack of appetite Continue to weigh daily - report weight gain of more than 3 lbs in 24 hours or 5 lbs in 1 week or signs listed above to provider Continue to follow up with Dr HiltyDebara Picketterlipidemia Not at goal. Last LDL was 72 LDL goal is <70 Current therapy: Vytorin 10/57mg daily Patient has been getting Vytorin from patient assistance program but has been notified that the program will no longer be able to supply Vytorin.  A Prescription for the 2 components in Vytorin - ezetimibe 10mg 58msimvastatin 57mg w95ment by Dr Lowne CCarollee HerterasanMeridianut patient is concerned about cost. She states will be $60 - she was not sure if this was for 30 or 90 days supply. Contacted [facility] and this cost if for 90 days.  I sent information about Part D plans  and [place] Med Assist program after last phone visit with patient but she has not reviewed information yet.  Interventions:  Encouraged patient to review information I sent to her. If she would like to discuss face to face, I offered to see her in October when she is in building to see Dr Marin Olp.  Another option  is to change to rosuvastatin 20 or 81m daily - 90 day supply is about $26.  Will send message to Dr HDebara Pickettand Dr LEtter Sjogren   Medication Management:  Current Pharmacy: [facility] Interventions:  Please review the information sent about possible Medicare Part D plans. Compare to see if yearly cost would be less than you are currently paying at the pharmacy.  The clinical pharmacist can assist you in signing up for a Part D plan. Review included information about the [place] Med Assist program. You might qualify to get a few of your medications for free.   Patient Goals/Self-Care Activities Over the next 90 days, patient will:  take medications as prescribed, focus on medication adherence by refilling needed medications and using pill boxes,  check glucose 3 to 4 times a day, document, and provide at future appointments,  check blood pressure daily, document, and provide at future appointments,  weigh daily, and contact provider if weight gain of 3 pounds in 24 hours or 5 pounds in 1 week, and  collaborate with provider on medication access solutions  Follow Up Plan: Telephone follow up appointment with care management team member scheduled for:  2 weeks.          The patient verbalized understanding of instructions, educational materials, and care plan provided today and declined offer to receive copy of patient instructions, educational materials, and care plan.   Telephone follow up appointment with care management team member scheduled for: 2 weeks  TCherre Robins PharmD Clinical Pharmacist [facility]

## 2021-08-29 ENCOUNTER — Other Ambulatory Visit: Payer: Self-pay | Admitting: Family Medicine

## 2021-08-29 DIAGNOSIS — N390 Urinary tract infection, site not specified: Secondary | ICD-10-CM | POA: Diagnosis not present

## 2021-09-02 DIAGNOSIS — I509 Heart failure, unspecified: Secondary | ICD-10-CM

## 2021-09-02 DIAGNOSIS — E119 Type 2 diabetes mellitus without complications: Secondary | ICD-10-CM | POA: Diagnosis not present

## 2021-09-02 DIAGNOSIS — E785 Hyperlipidemia, unspecified: Secondary | ICD-10-CM

## 2021-09-02 DIAGNOSIS — Z794 Long term (current) use of insulin: Secondary | ICD-10-CM

## 2021-09-02 DIAGNOSIS — I10 Essential (primary) hypertension: Secondary | ICD-10-CM

## 2021-09-14 ENCOUNTER — Telehealth: Payer: Medicare Other

## 2021-09-19 ENCOUNTER — Other Ambulatory Visit: Payer: Self-pay

## 2021-09-19 ENCOUNTER — Inpatient Hospital Stay: Payer: Medicare Other

## 2021-09-19 ENCOUNTER — Encounter: Payer: Self-pay | Admitting: Hematology & Oncology

## 2021-09-19 ENCOUNTER — Inpatient Hospital Stay (HOSPITAL_BASED_OUTPATIENT_CLINIC_OR_DEPARTMENT_OTHER): Payer: Medicare Other | Admitting: Hematology & Oncology

## 2021-09-19 ENCOUNTER — Inpatient Hospital Stay: Payer: Medicare Other | Attending: Hematology & Oncology

## 2021-09-19 VITALS — BP 142/49 | HR 59 | Temp 98.4°F | Resp 20 | Wt 196.0 lb

## 2021-09-19 DIAGNOSIS — D696 Thrombocytopenia, unspecified: Secondary | ICD-10-CM

## 2021-09-19 DIAGNOSIS — N184 Chronic kidney disease, stage 4 (severe): Secondary | ICD-10-CM | POA: Diagnosis not present

## 2021-09-19 DIAGNOSIS — Z23 Encounter for immunization: Secondary | ICD-10-CM | POA: Insufficient documentation

## 2021-09-19 DIAGNOSIS — Z79899 Other long term (current) drug therapy: Secondary | ICD-10-CM | POA: Diagnosis not present

## 2021-09-19 DIAGNOSIS — D631 Anemia in chronic kidney disease: Secondary | ICD-10-CM | POA: Insufficient documentation

## 2021-09-19 DIAGNOSIS — D6959 Other secondary thrombocytopenia: Secondary | ICD-10-CM | POA: Insufficient documentation

## 2021-09-19 DIAGNOSIS — K7581 Nonalcoholic steatohepatitis (NASH): Secondary | ICD-10-CM | POA: Diagnosis not present

## 2021-09-19 DIAGNOSIS — D649 Anemia, unspecified: Secondary | ICD-10-CM | POA: Insufficient documentation

## 2021-09-19 DIAGNOSIS — Z7982 Long term (current) use of aspirin: Secondary | ICD-10-CM | POA: Insufficient documentation

## 2021-09-19 DIAGNOSIS — K7460 Unspecified cirrhosis of liver: Secondary | ICD-10-CM | POA: Diagnosis not present

## 2021-09-19 DIAGNOSIS — R161 Splenomegaly, not elsewhere classified: Secondary | ICD-10-CM | POA: Insufficient documentation

## 2021-09-19 DIAGNOSIS — D500 Iron deficiency anemia secondary to blood loss (chronic): Secondary | ICD-10-CM | POA: Diagnosis not present

## 2021-09-19 HISTORY — DX: Anemia in chronic kidney disease: D63.1

## 2021-09-19 HISTORY — DX: Iron deficiency anemia secondary to blood loss (chronic): D50.0

## 2021-09-19 LAB — CBC WITH DIFFERENTIAL (CANCER CENTER ONLY)
Abs Immature Granulocytes: 0.03 10*3/uL (ref 0.00–0.07)
Basophils Absolute: 0 10*3/uL (ref 0.0–0.1)
Basophils Relative: 0 %
Eosinophils Absolute: 0.2 10*3/uL (ref 0.0–0.5)
Eosinophils Relative: 3 %
HCT: 34.2 % — ABNORMAL LOW (ref 36.0–46.0)
Hemoglobin: 10.8 g/dL — ABNORMAL LOW (ref 12.0–15.0)
Immature Granulocytes: 0 %
Lymphocytes Relative: 13 %
Lymphs Abs: 0.9 10*3/uL (ref 0.7–4.0)
MCH: 28.8 pg (ref 26.0–34.0)
MCHC: 31.6 g/dL (ref 30.0–36.0)
MCV: 91.2 fL (ref 80.0–100.0)
Monocytes Absolute: 0.8 10*3/uL (ref 0.1–1.0)
Monocytes Relative: 11 %
Neutro Abs: 5.4 10*3/uL (ref 1.7–7.7)
Neutrophils Relative %: 73 %
Platelet Count: 121 10*3/uL — ABNORMAL LOW (ref 150–400)
RBC: 3.75 MIL/uL — ABNORMAL LOW (ref 3.87–5.11)
RDW: 13.8 % (ref 11.5–15.5)
WBC Count: 7.5 10*3/uL (ref 4.0–10.5)
nRBC: 0 % (ref 0.0–0.2)

## 2021-09-19 LAB — SAVE SMEAR(SSMR), FOR PROVIDER SLIDE REVIEW

## 2021-09-19 LAB — RETICULOCYTES
Immature Retic Fract: 11.9 % (ref 2.3–15.9)
RBC.: 3.73 MIL/uL — ABNORMAL LOW (ref 3.87–5.11)
Retic Count, Absolute: 77.6 10*3/uL (ref 19.0–186.0)
Retic Ct Pct: 2.1 % (ref 0.4–3.1)

## 2021-09-19 MED ORDER — INFLUENZA VAC SPLIT QUAD 0.5 ML IM SUSY
0.5000 mL | PREFILLED_SYRINGE | Freq: Once | INTRAMUSCULAR | Status: AC
  Administered 2021-09-19: 0.5 mL via INTRAMUSCULAR

## 2021-09-19 MED ORDER — INFLUENZA VAC SPLIT QUAD 0.5 ML IM SUSY: 0.5000 mL | PREFILLED_SYRINGE | Freq: Once | INTRAMUSCULAR | Status: AC

## 2021-09-19 NOTE — Patient Instructions (Signed)
Influenza Virus Vaccine injection What is this medication? INFLUENZA VIRUS VACCINE (in floo EN zuh VAHY ruhs vak SEEN) helps to reduce the risk of getting influenza also known as the flu. The vaccine only helps protect you against some strains of the flu. This medicine may be used for other purposes; ask your health care provider or pharmacist if you have questions. COMMON BRAND NAME(S): Afluria, Afluria Quadrivalent, Agriflu, Alfuria, FLUAD, FLUAD Quadrivalent, Fluarix, Fluarix Quadrivalent, Flublok, Flublok Quadrivalent, FLUCELVAX, FLUCELVAX Quadrivalent, Flulaval, Flulaval Quadrivalent, Fluvirin, Fluzone, Fluzone High-Dose, Fluzone Intradermal, Fluzone Quadrivalent What should I tell my care team before I take this medication? They need to know if you have any of these conditions: bleeding disorder like hemophilia fever or infection Guillain-Barre syndrome or other neurological problems immune system problems infection with the human immunodeficiency virus (HIV) or AIDS low blood platelet counts multiple sclerosis an unusual or allergic reaction to influenza virus vaccine, latex, other medicines, foods, dyes, or preservatives. Different brands of vaccines contain different allergens. Some may contain latex or eggs. Talk to your doctor about your allergies to make sure that you get the right vaccine. pregnant or trying to get pregnant breast-feeding How should I use this medication? This vaccine is for injection into a muscle or under the skin. It is given by a health care professional. A copy of Vaccine Information Statements will be given before each vaccination. Read this sheet carefully each time. The sheet may change frequently. Talk to your healthcare provider to see which vaccines are right for you. Some vaccines should not be used in all age groups. Overdosage: If you think you have taken too much of this medicine contact a poison control center or emergency room at once. NOTE: This  medicine is only for you. Do not share this medicine with others. What if I miss a dose? This does not apply. What may interact with this medication? chemotherapy or radiation therapy medicines that lower your immune system like etanercept, anakinra, infliximab, and adalimumab medicines that treat or prevent blood clots like warfarin phenytoin steroid medicines like prednisone or cortisone theophylline vaccines This list may not describe all possible interactions. Give your health care provider a list of all the medicines, herbs, non-prescription drugs, or dietary supplements you use. Also tell them if you smoke, drink alcohol, or use illegal drugs. Some items may interact with your medicine. What should I watch for while using this medication? Report any side effects that do not go away within 3 days to your doctor or health care professional. Call your health care provider if any unusual symptoms occur within 6 weeks of receiving this vaccine. You may still catch the flu, but the illness is not usually as bad. You cannot get the flu from the vaccine. The vaccine will not protect against colds or other illnesses that may cause fever. The vaccine is needed every year. What side effects may I notice from receiving this medication? Side effects that you should report to your doctor or health care professional as soon as possible: allergic reactions like skin rash, itching or hives, swelling of the face, lips, or tongue Side effects that usually do not require medical attention (report to your doctor or health care professional if they continue or are bothersome): fever headache muscle aches and pains pain, tenderness, redness, or swelling at the injection site tiredness Side effects that you should report to your doctor or health care professional as soon as possible: allergic reactions like skin rash, itching or hives, swelling of  the face, lips, or tongue Side effects that usually do not  require medical attention (report to your doctor or health care professional if they continue or are bothersome): fever headache muscle aches and pains pain, tenderness, redness, or swelling at the injection site tiredness This list may not describe all possible side effects. Call your doctor for medical advice about side effects. You may report side effects to FDA at 1-800-FDA-1088. Where should I keep my medication? The vaccine will be given by a health care professional in a clinic, pharmacy, doctor's office, or other health care setting. You will not be given vaccine doses to store at home. NOTE: This sheet is a summary. It may not cover all possible information. If you have questions about this medicine, talk to your doctor, pharmacist, or health care provider.  2022 Elsevier/Gold Standard (2020-07-27 19:49:22)

## 2021-09-19 NOTE — Progress Notes (Signed)
Hematology and Oncology Follow Up Visit  LEELOO SILVERTHORNE [id number] [birth year] 86 y.o. 09/19/2021   Principle Diagnosis:  Thrombocytopenia secondary to NASH Multifactorial anemia  Current Therapy:   Observation     Interim History:  Ms. Roberg is back for follow-up.  Last saw her back in April.  Since then, she has been doing okay.  Unfortunately, she now is on oxygen.  I am unsure as to why she is on oxygen full-time.  She is not sure as to why she is on oxygen full-time..  She does have a little bit of anemia.  I do not think this is the cause or a consequence of the oxygen.  We will check her iron studies.  We will check her erythropoietin level.  I suspect that she probably has a low erythropoietin level from her diabetes.  She has a seborrheic keratosis under her right breast.  I told her this is something that a dermatologist could certainly take care of if this was causing problems for her.  We did do a ultrasound of her abdomen back in April.  This shows some cirrhosis.  She does have cirrhosis.  I am sure she has some degree of splenomegaly.  There is no bleeding.  She has a lot of bruising.  She is on aspirin.  She has had no problems with fever.  She has had no COVID issues.  Overall, her performance status is ECOG 2.     Medications:  Current Outpatient Medications:    ACCU-CHEK GUIDE test strip, CHECK BLOOD GLUCOSE FOUR TIMES DAILY, Disp: , Rfl:    Accu-Chek Softclix Lancets lancets, Check BG 4 times/day.  Dx E11.65, Disp: , Rfl:    albuterol (PROVENTIL) (2.5 MG/3ML) 0.083% nebulizer solution, Take 3 mLs (2.5 mg total) by nebulization every 6 (six) hours as needed for wheezing or shortness of breath., Disp: 75 mL, Rfl: 12   aspirin EC 81 MG EC tablet, Take 1 tablet (81 mg total) by mouth daily., Disp:  , Rfl:    docusate sodium (COLACE) 100 MG capsule, Take 1 capsule (100 mg total) by mouth 2 (two) times daily., Disp: 10 capsule, Rfl: 0   Ezetimibe-Simvastatin (VYTORIN  PO), Take 1 tablet by mouth daily., Disp: , Rfl:    hydrALAZINE (APRESOLINE) 50 MG tablet, Take 1 tablet (50 mg total) by mouth every 8 (eight) hours., Disp: 270 tablet, Rfl: 3   insulin glargine (LANTUS) 100 UNIT/ML injection, Inject 0.25 mLs (25 Units total) into the skin at bedtime., Disp: , Rfl:    Insulin Glulisine (APIDRA SOLOSTAR) 100 UNIT/ML Solostar Pen, Inject 10-15 Units into the skin 3 (three) times daily before meals. , Disp: , Rfl:    ipratropium-albuterol (DUONEB) 0.5-2.5 (3) MG/3ML SOLN, Take 3 mLs by nebulization 3 (three) times daily., Disp: 360 mL, Rfl: 3   levothyroxine (SYNTHROID, LEVOTHROID) 25 MCG tablet, take 1 tablet by mouth daily (Patient taking differently: Take 25 mcg by mouth. Daily except none on Saturdays and Sundays), Disp: 30 tablet, Rfl: 5   metoprolol tartrate (LOPRESSOR) 25 MG tablet, Take 1 tablet (25 mg total) by mouth 2 (two) times daily., Disp: 180 tablet, Rfl: 3   Multiple Vitamins-Minerals (CENTRUM SILVER ULTRA WOMENS PO), Take 1 tablet by mouth at bedtime. , Disp: , Rfl:    oxybutynin (DITROPAN) 5 MG tablet, TAKE 1 TABLET BY MOUTH DAILY, Disp: 90 tablet, Rfl: 1   polyethylene glycol (MIRALAX / GLYCOLAX) 17 g packet, Take 17 g by mouth daily as needed.,  Disp: , Rfl:    potassium chloride SA (KLOR-CON) 20 MEQ tablet, TAKE 1 TABLET BY MOUTH DAILY AT BEDTIME, Disp: 30 tablet, Rfl: 11   torsemide (DEMADEX) 20 MG tablet, Take 2 tablets (70m) by mouth in the morning and 1 tablet (238m in the afternoon., Disp: 225 tablet, Rfl: 3   traMADol (ULTRAM) 50 MG tablet, Take 1 tablet (50 mg total) by mouth every 6 (six) hours as needed for moderate pain., Disp: 60 tablet, Rfl: 1   LORazepam (ATIVAN) 0.5 MG tablet, Take 1 tablet (0.5 mg total) by mouth daily as needed for anxiety. (Patient not taking: Reported on 09/19/2021), Disp: 30 tablet, Rfl: 1   sucralfate (CARAFATE) 1 g tablet, Take 1 tablet (1 g total) by mouth 4 (four) times daily -  with meals and at bedtime.  (Patient not taking: Reported on 09/19/2021), Disp: 90 tablet, Rfl: 0  Allergies:  Allergies  Allergen Reactions   Other Other (See Comments)    Patient has hemorrhaged at least 4 times in her lifetime   Bactrim [Sulfamethoxazole-Trimethoprim]    Cefuroxime Axetil Nausea Only    Upset stomach    Ciprofloxacin Nausea Only    Upset stomach    Past Medical History, Surgical history, Social history, and Family History were reviewed and updated.  Review of Systems: Review of Systems  Constitutional:  Positive for fatigue.  HENT:  Negative.    Eyes: Negative.   Respiratory: Negative.    Cardiovascular: Negative.   Gastrointestinal: Negative.   Endocrine: Negative.   Genitourinary: Negative.    Musculoskeletal:  Positive for arthralgias and back pain.  Neurological: Negative.   Hematological:  Bruises/bleeds easily.  Psychiatric/Behavioral: Negative.     Physical Exam:  weight is 196 lb (88.9 kg). Her oral temperature is 98.4 F (36.9 C). Her blood pressure is 142/49 (abnormal) and her pulse is 59 (abnormal). Her respiration is 20 and oxygen saturation is 98%.   Wt Readings from Last 3 Encounters:  09/19/21 196 lb (88.9 kg)  08/15/21 199 lb 12.8 oz (90.6 kg)  07/29/21 206 lb 9.6 oz (93.7 kg)    Physical Exam Vitals reviewed.  HENT:     Head: Normocephalic and atraumatic.  Eyes:     Pupils: Pupils are equal, round, and reactive to light.  Cardiovascular:     Rate and Rhythm: Normal rate and regular rhythm.     Heart sounds: Normal heart sounds.  Pulmonary:     Effort: Pulmonary effort is normal.     Breath sounds: Normal breath sounds.  Abdominal:     General: Bowel sounds are normal.     Palpations: Abdomen is soft.  Musculoskeletal:        General: No tenderness or deformity. Normal range of motion.     Cervical back: Normal range of motion.  Lymphadenopathy:     Cervical: No cervical adenopathy.  Skin:    General: Skin is warm and dry.     Findings: No  erythema or rash.  Neurological:     Mental Status: She is alert and oriented to person, place, and time.  Psychiatric:        Behavior: Behavior normal.        Thought Content: Thought content normal.        Judgment: Judgment normal.     Lab Results  Component Value Date   WBC 7.5 09/19/2021   HGB 10.8 (L) 09/19/2021   HCT 34.2 (L) 09/19/2021   MCV 91.2 09/19/2021   PLT  121 (L) 09/19/2021     Chemistry      Component Value Date/Time   NA 138 07/20/2021 1622   K 4.5 07/20/2021 1622   CL 98 07/20/2021 1622   CO2 29 07/20/2021 1622   BUN 32 (H) 07/20/2021 1622   CREATININE 1.46 (H) 07/20/2021 1622   CREATININE 1.41 (H) 03/28/2021 1338   CREATININE 1.06 (H) 11/30/2017 1559   GLU 136 12/03/2015 0000      Component Value Date/Time   CALCIUM 9.3 07/20/2021 1622   ALKPHOS 63 07/20/2021 1622   AST 24 07/20/2021 1622   AST 19 03/28/2021 1338   ALT 21 07/20/2021 1622   ALT 19 03/28/2021 1338   BILITOT 0.4 07/20/2021 1622   BILITOT 0.6 03/28/2021 1338      Impression and Plan: Ms. Weekes is a very nice 86 year old white female.  She has NASH cirrhosis.  She has mild thrombocytopenia.  I looked at her blood smear under the microscope.  She certainly has large platelets.  This is a good sign that her bone marrow is functioning okay.  We will have to see what the anemia shows.  Again, I suspect that is going be her erythropoietin level is currently on the low side.  If so, we can certainly try her on ESA.  Again I am not sure if improving the anemia is going to help with the breathing issues.  It sounds like she may have some lung disease.  She is not yet had any pulmonary function studies yet.  We probably will have to get her back to see [place] in another 3 to 4 weeks for follow-up.  I want to make sure that we try to improve her status before the holidays.   Volanda Napoleon, MD 10/17/20223:31 PM

## 2021-09-20 ENCOUNTER — Telehealth: Payer: Self-pay

## 2021-09-20 LAB — IRON AND TIBC
Iron: 54 ug/dL (ref 41–142)
Saturation Ratios: 16 % — ABNORMAL LOW (ref 21–57)
TIBC: 344 ug/dL (ref 236–444)
UIBC: 290 ug/dL (ref 120–384)

## 2021-09-20 LAB — FERRITIN: Ferritin: 96 ng/mL (ref 11–307)

## 2021-09-20 NOTE — Telephone Encounter (Signed)
Nurse Assessment Nurse: Tamala Julian, RN, Karis Date/Time Eilene Ghazi Time): 09/20/2021 12:12:10 AM Confirm and document reason for call. If symptomatic, describe symptoms. ---Caller states mom is having pain in her back that she's nauseas and she wants to know what medicine she can take with the current medication she is currently on. Took Tramadol at 7:30 pm. Pain 9-10/10 Does the patient have any new or worsening symptoms? ---Yes Will a triage be completed? ---Yes Related visit to physician within the last 2 weeks? ---Yes Does the PT have any chronic conditions? (i.e. diabetes, asthma, this includes High risk factors for pregnancy, etc.) ---Yes List chronic conditions. ---Diabetes, chronic back pain, one kidney, HTN, heart failure, SOB on O2 Is this a behavioral health or substance abuse call? ---No Guidelines Guideline Title Affirmed Question Affirmed Notes Nurse Date/Time (Eastern Time) Back Pain High-risk adult (e.g., history of cancer, HIV, or IV drug use) Tamala Julian, RN, Karis 09/20/2021 12:18:47 AM Disp. Time Eilene Ghazi Time) Disposition Final User PLEASE NOTE: All timestamps contained within this report are represented as [place] Standard Time. CONFIDENTIALTY NOTICE: This fax transmission is intended only for the addressee. It contains information that is legally privileged, confidential or otherwise protected from use or disclosure. If you are not the intended recipient, you are strictly prohibited from reviewing, disclosing, copying using or disseminating any of this information or taking any action in reliance on or regarding this information. If you have received this fax in error, please notify us immediately by telephone so that we can arrange for its return to [place]. Phone: [phone], Toll-Free: [phone], Fax: [phone] Page: 2 of 2 Call Id: [id number] 09/20/2021 12:33:53 AM See PCP within 24 Hours Yes Tamala Julian, RN, Celso Sickle Caller Disagree/Comply Comply Caller Understands  Yes PreDisposition Home Care Care Advice Given Per Guideline SEE PCP WITHIN 24 HOURS: PAIN MEDICINES: * For pain relief, you can take either acetaminophen, ibuprofen, or naproxen. CALL BACK IF: * You become worse Comments User: Conni Elliot, RN Date/Time Eilene Ghazi Time): 09/20/2021 12:26:01 AM Kidney cancer Referrals REFERRED TO PCP OFFICE

## 2021-09-20 NOTE — Telephone Encounter (Signed)
Caller states mom is having pain in her back that she's nauseas and she wants to know what medicine she can take with the current medication she is currently on. Took Tramadol at 7:30 pm. Pain is 9 out of 10  Please advise

## 2021-09-21 ENCOUNTER — Telehealth: Payer: Self-pay | Admitting: Pharmacist

## 2021-09-21 ENCOUNTER — Telehealth: Payer: Medicare Other

## 2021-09-21 NOTE — Telephone Encounter (Signed)
Attempted to contact patient for Chronic Care Management follow up phone visit. Unable to reach patient. LM on VM with my contact number [phone] on patient's voicemail and both daughter's voicemail.

## 2021-09-21 NOTE — Telephone Encounter (Signed)
Left message on machine to call back  

## 2021-09-22 ENCOUNTER — Other Ambulatory Visit: Payer: Self-pay | Admitting: Family

## 2021-09-22 LAB — ERYTHROPOIETIN: Erythropoietin: 18.6 m[IU]/mL — ABNORMAL HIGH (ref 2.6–18.5)

## 2021-09-23 ENCOUNTER — Telehealth: Payer: Self-pay | Admitting: *Deleted

## 2021-09-23 NOTE — Telephone Encounter (Signed)
Message left to inform pt per order of Dr. Marin Olp "that the iron is low.  We need to give her a dose of IV iron" and that one iron has been approved, scheduling will call her to make an appt.  Instructed pt to call office back with any questions or concerns.

## 2021-09-23 NOTE — Telephone Encounter (Signed)
-----  Message from Volanda Napoleon, MD sent at 09/20/2021 12:37 PM EDT ----- Call and let her know that the iron is low.  We need to give her a dose of IV iron.  Please set this up.  Thanks.  Laurey Arrow

## 2021-09-27 ENCOUNTER — Telehealth: Payer: Self-pay | Admitting: Family Medicine

## 2021-09-27 ENCOUNTER — Ambulatory Visit (INDEPENDENT_AMBULATORY_CARE_PROVIDER_SITE_OTHER): Payer: Medicare Other

## 2021-09-27 ENCOUNTER — Telehealth: Payer: Self-pay

## 2021-09-27 DIAGNOSIS — I272 Pulmonary hypertension, unspecified: Secondary | ICD-10-CM

## 2021-09-27 DIAGNOSIS — E119 Type 2 diabetes mellitus without complications: Secondary | ICD-10-CM

## 2021-09-27 DIAGNOSIS — I509 Heart failure, unspecified: Secondary | ICD-10-CM

## 2021-09-27 DIAGNOSIS — I10 Essential (primary) hypertension: Secondary | ICD-10-CM

## 2021-09-27 NOTE — Telephone Encounter (Signed)
Pt called [facility] transportation and scheduled appointment to be taking home on November 3rd.

## 2021-09-27 NOTE — Patient Instructions (Addendum)
Visit Information: Thank you for taking the time to speak with me.   PATIENT GOALS:  Goals Addressed             This Visit's Progress    Monitor and Manage My Blood Sugar-Diabetes Type 2   On track    Timeframe:  Long-Range Goal Priority:  Medium Start Date:  06/27/2021                           Expected End Date: 12/28/2021                      Follow Up Date 10/25/21   Patient Goals: - Check blood sugar at recommended times and check blood sugar if you feel it is too high or too low. - Blood sugar goals: Fasting Blood Sugar goal 80-130 and Blood Sugar Goal after meal < 180. - Take the blood sugar log and meter to all doctor visits - Continue to take your medications as prescribed. - Continue to attend provider visits as recommended - Call your nurse case manager, if you continue to have difficulty getting transportation to your appointments. - Continue to eat healthy meals: eat plenty of vegetables, fruits, lean meats; monitor your fat intake; limit: sugars, rice, potatoes, pasta     Track and Manage Symptoms-Heart Failure/health conditions   On track    Timeframe:  Long-Range Goal Priority:  Medium Start Date:  06/27/2021                           Expected End Date:  12/28/2021                     Follow Up Date 10/25/21   Patient Goals: - Know when to call the doctor: increased weight gain more than 3 pounds overnight or 5 pounds in a week, increased swelling in stomach, hands, feet or legs, increased shortness of breath, if it is harder for you to breathe when lying down and need to sit up to breath, dizziness, chest discomfort heaviness or pain or you feel that something is not right.  - Monitor and record blood pressure and call if your blood pressure is outside of recommended parameters - Take your medications as prescribed by your doctor - Attend provider visits as recommended/scheduled - Call your nurse case manager, if you continue to have difficulty getting  transportation to your appointments. - Continue to work with the Care Management team for ongoing care coordination/disease management needs.        The patient verbalized understanding of instructions, educational materials, and care plan provided today and agreed to receive a mailed copy of patient instructions, educational materials, and care plan.   Telephone follow up appointment with care management team member scheduled for: 10/25/21 The patient has been provided with contact information for the care management team and has been advised to call with any health related questions or concerns.   Thea Silversmith, RN, MSN, BSN, CCM Care Management Coordinator [facility] [phone]

## 2021-09-27 NOTE — Chronic Care Management (AMB) (Signed)
Chronic Care Management   CCM RN Visit Note  09/27/2021 Name: Margaret Dyer MRN: [id number] DOB: [birth year]  Subjective: Margaret Dyer is a 86 y.o. year old female who is a primary care patient of Ann Held, DO. The care management team was consulted for assistance with disease management and care coordination needs.    Engaged with patient by telephone for follow up visit in response to provider referral for case management and/or care coordination services.   Consent to Services:  The patient was given information about Chronic Care Management services, agreed to services, and gave verbal consent prior to initiation of services.  Please see initial visit note for detailed documentation.   Patient agreed to services and verbal consent obtained.   Assessment: Review of patient past medical history, allergies, medications, health status, including review of consultants reports, laboratory and other test data, was performed as part of comprehensive evaluation and provision of chronic care management services.   SDOH (Social Determinants of Health) assessments and interventions performed:    CCM Care Plan  Allergies  Allergen Reactions   Other Other (See Comments)    Patient has hemorrhaged at least 4 times in her lifetime   Bactrim [Sulfamethoxazole-Trimethoprim]    Cefuroxime Axetil Nausea Only    Upset stomach    Ciprofloxacin Nausea Only    Upset stomach    Outpatient Encounter Medications as of 09/27/2021  Medication Sig Note   albuterol (PROVENTIL) (2.5 MG/3ML) 0.083% nebulizer solution Take 3 mLs (2.5 mg total) by nebulization every 6 (six) hours as needed for wheezing or shortness of breath.    aspirin EC 81 MG EC tablet Take 1 tablet (81 mg total) by mouth daily.    docusate sodium (COLACE) 100 MG capsule Take 1 capsule (100 mg total) by mouth 2 (two) times daily.    Ezetimibe-Simvastatin (VYTORIN PO) Take 1 tablet by mouth daily.    hydrALAZINE (APRESOLINE)  50 MG tablet Take 1 tablet (50 mg total) by mouth every 8 (eight) hours.    insulin glargine (LANTUS) 100 UNIT/ML injection Inject 0.25 mLs (25 Units total) into the skin at bedtime.    Insulin Glulisine (APIDRA SOLOSTAR) 100 UNIT/ML Solostar Pen Inject 10-15 Units into the skin 3 (three) times daily before meals.     ipratropium-albuterol (DUONEB) 0.5-2.5 (3) MG/3ML SOLN Take 3 mLs by nebulization 3 (three) times daily.    levothyroxine (SYNTHROID, LEVOTHROID) 25 MCG tablet take 1 tablet by mouth daily (Patient taking differently: Take 25 mcg by mouth. Daily except none on Saturdays and Sundays)    metoprolol tartrate (LOPRESSOR) 25 MG tablet Take 1 tablet (25 mg total) by mouth 2 (two) times daily.    Multiple Vitamins-Minerals (CENTRUM SILVER ULTRA WOMENS PO) Take 1 tablet by mouth at bedtime.  09/27/2021: Reports takes every other day   oxybutynin (DITROPAN) 5 MG tablet TAKE 1 TABLET BY MOUTH DAILY    polyethylene glycol (MIRALAX / GLYCOLAX) 17 g packet Take 17 g by mouth daily as needed.    potassium chloride SA (KLOR-CON) 20 MEQ tablet TAKE 1 TABLET BY MOUTH DAILY AT BEDTIME    torsemide (DEMADEX) 20 MG tablet Take 2 tablets (74m) by mouth in the morning and 1 tablet (252m in the afternoon.    traMADol (ULTRAM) 50 MG tablet Take 1 tablet (50 mg total) by mouth every 6 (six) hours as needed for moderate pain.    ACCU-CHEK GUIDE test strip CHECK BLOOD GLUCOSE FOUR TIMES DAILY  Accu-Chek Softclix Lancets lancets Check BG 4 times/day.  Dx E11.65    LORazepam (ATIVAN) 0.5 MG tablet Take 1 tablet (0.5 mg total) by mouth daily as needed for anxiety. (Patient not taking: No sig reported)    sucralfate (CARAFATE) 1 g tablet Take 1 tablet (1 g total) by mouth 4 (four) times daily -  with meals and at bedtime. (Patient not taking: Reported on 09/19/2021) 09/27/2021: Reports takes as needed.   No facility-administered encounter medications on file as of 09/27/2021.    Patient Active Problem List    Diagnosis Date Noted   Erythropoietin deficiency anemia 09/19/2021   Iron deficiency anemia due to chronic blood loss 09/19/2021   Uncontrolled type 2 diabetes mellitus with hyperglycemia ([place]) 06/09/2021   Chronic renal disease, stage 4, severely decreased glomerular filtration rate (GFR) between 15-29 mL/min/1.73 square meter ([place]) 06/09/2021   Closed fracture of maxillary sinus ([place]) 07/10/2019   UTI (urinary tract infection) 07/03/2019   Food poisoning 07/03/2019   Bacteremia 07/03/2019   Exposure to COVID-19 virus 06/16/2019   Closed left hip fracture ([place]) 12/24/2018   Dysuria 12/17/2017   Dizziness 12/17/2017   Urinary incontinence 11/09/2017   Tic 11/09/2017   Pulmonary hypertension ([place]) 09/12/2017   Chronic diastolic CHF (congestive heart failure) ([place]) 06/05/2017   Oral candida 05/29/2017   Vitamin D deficiency 05/29/2017   Chronic respiratory failure ([place]) 05/24/2017   Anxiety 05/24/2017   Right heart failure ([place]) 05/17/2017   Chronic diastolic heart failure (HCC)    Exertional dyspnea 05/13/2017   Hypertension 05/13/2017   Asthma 05/13/2017   DOE (dyspnea on exertion) 05/13/2017   Diabetes mellitus type 2, insulin dependent ([place]) 05/13/2017   Acute kidney injury ([place]) 05/13/2017   History of nephrectomy 05/13/2017   Hyperlipidemia LDL goal <70 05/13/2017   Liver cirrhosis secondary to NASH ([place]) 05/13/2017   Thrombocytopenia ([place]) 05/13/2017   Renal cell carcinoma 05/13/2017   Hypothyroidism 05/13/2017   Hypersplenism 05/13/2017   Osteoarthritis 05/13/2017   Overactive bladder 05/13/2017   URI, acute 02/22/2017   Abrasion, left knee, initial encounter 02/22/2017   CTS (carpal tunnel syndrome) 03/04/2016   Bilateral contusion of ribs 10/05/2015   Dental abscess 04/15/2014   Claudication, intermittent ([place]) 12/05/2013   Obesity (BMI 30-39.9) 07/22/2013   Back pain 05/26/2013   Platelets decreased ([place]) 05/26/2013   Breast pain, right 04/21/2013   Anxiety  and depression 12/05/2012   Bronchitis 11/06/2012   HEMATURIA UNSPECIFIED 02/17/2011   Hyperlipidemia 09/08/2010   B12 DEFICIENCY 07/22/2010   DIZZINESS 07/04/2010   Essential hypertension 06/21/2010   GOUT, UNSPECIFIED 01/27/2010   NEPHRECTOMY, HX OF 12/02/2009   NECK PAIN, LEFT 09/04/2008   PARESTHESIA 09/02/2007   GOITER NOS 04/23/2007   Diabetes mellitus ([place]) 04/23/2007   Asthma 04/23/2007   Renal stone 04/23/2007   OSTEOPENIA 04/23/2007   CARCINOMA, RENAL CELL 08/10/1999    Conditions to be addressed/monitored:CHF, HTN, DMII, and Pulmonary HTN  Care Plan : Cardiovascular disease  Updates made by Luretha Rued, RN since 09/27/2021 12:00 AM     Problem: lack of long term care plan for managment of cardiovasculare disease in a patient with CHF, HTN, DM, pulmonary HTN   Priority: Medium     Long-Range Goal: Symptom Exacerbation Prevented or Minimized   Start Date: 06/27/2021  Expected End Date: 12/28/2021  This Visit's Progress: On track  Recent Progress: On track  Priority: Medium  Note:   Objective:  Last practice recorded BP readings:  BP Readings from  Last 3 Encounters:  09/19/21 (!) 142/49  08/15/21 118/68  07/30/21 (!) 149/72  Most recent eGFR/CrCl:  Lab Results  Component Value Date   EGFR 35 (L) 07/20/2021  Current Barriers: admission 05/25/21-05/29/21 with congestive heart failure and AKI. ED visit for acute on chronic heart failure on 07/04/21. ED visit for acute cystitis on 07/29/21. Patient with history of HTN, Pulmonary Hypertension with chronic respiratory failure, chronic renal disease. Has received [facility] and states it is working out well for her. She denies any chest pain or problems at this time. Sleep study scheduled for 10/05/21 and has concern for transportation in how to get there. She has to be there at 8 pm.  She reports weight today 196 pounds which is 2 pounds less than when RNCM spoke with Ms. Patlan last month. She denies any signs/symptoms  of heart failure exacerbation at this time. Patient states she continues to check blood pressure routinely. She reports today blood pressure 159/65, but states did not have reading with her during assessment. Knowledge deficit related to long term care plan for self-management of cardiac disease in a patient with CHF, HTN, AKI, Pulmonary HTN, renal cell carcinoma and liver cirrhosis. Transportation Barriers-has received transportation resource information from the care guide. Ms. Dworkin confirmed she has scheduled a pick up on 10/06/21 from sleep study through [place]):  patient will weigh self daily and record patient will verbalize understanding of Heart Failure Action Plan and when to call doctor patient will take all cardiac mediations as prescribed Patient will check blood pressure as recommended by providers Interventions:  Collaboration with Carollee Herter, Alferd Apa, DO regarding development and update of comprehensive plan of care as evidenced by provider attestation and co-signature Inter-disciplinary care team collaboration (see longitudinal plan of care) Discussed signs/symptoms of HF exacerbation Medications reviewed. Encouraged Ms. Koke to contact clinical pharmacist if she has difficulty obtaining or if she has any questions about obtaining medications.  Reviewed upcoming appointment with patient. Collaboration with clinical pharmacist regarding medications Encouraged Ms. Stotz to contact nurse case manager, if she has difficulty obtaining transportation to sleep study.  Discussed blood pressure and encouraged Ms. Cratty to follow up with cardiologist and/or primary care provider if it is outside of provider recommendation.  Discussed plans with patient for ongoing care management follow up and provided patient with direct contact information for care management team Patient Goals: - Know when to call the doctor: increased weight gain more than 3  pounds overnight or 5 pounds in a week, increased swelling in stomach, hands, feet or legs, increased shortness of breath, if it is harder for you to breathe when lying down and need to sit up to breath, dizziness, chest discomfort heaviness or pain or you feel that something is not right.  - Monitor and record blood pressure and call if your blood pressure is outside of recommended parameters - Take your medications as prescribed by your doctor - Attend provider visits as recommended/scheduled - Call your nurse case manager, if you continue to have difficulty getting transportation to your appointments. - Continue to work with the Care Management team for ongoing care coordination/disease management needs. Follow Up Plan: Telephone follow up appointment with care management team member scheduled for: next month The patient has been provided with contact information for the care management team and has been advised to call with any health related questions or concerns.      Care Plan : Diabetes Type 2 (  Adult)  Updates made by Luretha Rued, RN since 09/27/2021 12:00 AM     Problem: Glycemic Management (Diabetes, Type 2)   Priority: Medium     Long-Range Goal: Glycemic Management Optimized   Start Date: 06/27/2021  Expected End Date: 12/28/2021  This Visit's Progress: On track  Recent Progress: On track  Priority: Medium  Note:   Objective:  Lab Results  Component Value Date   HGBA1C 6.3 (H) 05/25/2021   Lab Results  Component Value Date   CREATININE 1.46 (H) 07/20/2021   CREATININE 1.31 (H) 07/04/2021   CREATININE 1.30 (H) 06/09/2021  Current Barriers:  Knowledge Deficits related to long term care plan for self-management of Diabetes. Patient without any questions or concerns at this time. Blood sugar this morning was 137 ac. She states it has been ranging between 137-159.  Denies any problems or concerns regarding blood sugar. Case Manager Clinical Goal(s):  patient will  demonstrate improved adherence to prescribed treatment plan for diabetes self care/management as evidenced by: improved daily monitoring and recording of CBG  adherence to ADA/ carb modified diet adherence to prescribed medication regimen Interventions:  Collaboration with Thomson, DO regarding development and update of comprehensive plan of care as evidenced by provider attestation and co-signature Inter-disciplinary care team collaboration (see longitudinal plan of care) Discussed medications. Ms. Donahey states she has all her medications now. Reinforced with her to reach out to clinical pharmacist if she has any difficulty obtaining medications. Collaboration with clinical pharmacist as needed re: follow up appointment with clinical pharmacist. Reviewed scheduled/upcoming provider appointments. RNCM encouraged patient to continue to check blood sugar and record as recommended.  Reiterated FBS goal 80-130; goal after meal < 180. Discussed plans with patient for ongoing care management follow up and provided patient with direct contact information for care management team Patient Goals: - Check blood sugar at recommended times and check blood sugar if you feel it is too high or too low. - Blood sugar goals: Fasting Blood Sugar goal 80-130 and Blood Sugar Goal after meal < 180. - Take the blood sugar log and meter to all doctor visits - Continue to take your medications as prescribed. - Continue to attend provider visits as recommended. - Call your nurse case manager, if you continue to have difficulty getting transportation to your appointments. - Continue to eat healthy meals: eat plenty of vegetables, fruits, lean meats; monitor your fat intake; limit: sugars, rice, potatoes, pasta Follow Up Plan: Telephone follow up appointment with care management team member scheduled for: next month   Plan:Telephone follow up appointment with care management team member scheduled for:  next  month The patient has been provided with contact information for the care management team and has been advised to call with any health related questions or concerns.   Thea Silversmith, RN, MSN, BSN, CCM Care Management Coordinator [facility] [phone]

## 2021-09-27 NOTE — Telephone Encounter (Signed)
  Care Management   Follow Up Note   09/27/2021 Name: Margaret Dyer MRN: [id number] DOB: 08-20-1935   Referred by: Ann Held, DO Reason for referral : Chronic Care Management (RNCM follow up/)   An unsuccessful telephone outreach was attempted today. The patient was referred to the case management team for assistance with care management and care coordination.   Follow Up Plan: The care management team will reach out to the patient again over the next 30 days.   Thea Silversmith, RN, MSN, BSN, CCM Care Management Coordinator [facility] [phone]

## 2021-09-28 ENCOUNTER — Telehealth: Payer: Self-pay | Admitting: *Deleted

## 2021-09-28 NOTE — Telephone Encounter (Signed)
Per scheduling message [place] - Called and lvm to call back to schedule. (1) dose of IV Iron

## 2021-09-29 ENCOUNTER — Telehealth: Payer: Self-pay | Admitting: Hematology & Oncology

## 2021-09-30 ENCOUNTER — Telehealth: Payer: Self-pay | Admitting: *Deleted

## 2021-09-30 NOTE — Telephone Encounter (Signed)
Per staff message Judson Roch - called and gave upcoming appointment (1) dose of IV Iron

## 2021-09-30 NOTE — Telephone Encounter (Signed)
Called again and lvm to schedule (1) dose of IV Iron - requested callback to schedule

## 2021-10-03 DIAGNOSIS — I10 Essential (primary) hypertension: Secondary | ICD-10-CM

## 2021-10-03 DIAGNOSIS — E119 Type 2 diabetes mellitus without complications: Secondary | ICD-10-CM

## 2021-10-03 DIAGNOSIS — Z794 Long term (current) use of insulin: Secondary | ICD-10-CM | POA: Diagnosis not present

## 2021-10-03 DIAGNOSIS — I509 Heart failure, unspecified: Secondary | ICD-10-CM | POA: Diagnosis not present

## 2021-10-05 ENCOUNTER — Ambulatory Visit (HOSPITAL_BASED_OUTPATIENT_CLINIC_OR_DEPARTMENT_OTHER): Payer: Medicare Other | Attending: Pulmonary Disease | Admitting: Pulmonary Disease

## 2021-10-05 ENCOUNTER — Other Ambulatory Visit: Payer: Self-pay

## 2021-10-05 DIAGNOSIS — G4733 Obstructive sleep apnea (adult) (pediatric): Secondary | ICD-10-CM | POA: Insufficient documentation

## 2021-10-05 DIAGNOSIS — G4736 Sleep related hypoventilation in conditions classified elsewhere: Secondary | ICD-10-CM | POA: Diagnosis not present

## 2021-10-06 NOTE — Procedures (Signed)
    Patient Name: Margaret Dyer, Margaret Dyer Date: 10/05/2021 Gender: Female D.O.B: 08-29-1935 Age (years): 45 Referring Provider: Chi Rodman Pickle Height (inches): 3 Interpreting Physician: Chesley Mires MD, ABSM Weight (lbs): 196 RPSGT: Laren Everts BMI: 34 MRN: [id number] Neck Size: 16.00  CLINICAL INFORMATION Sleep Study Type: NPSG  Indication for sleep study: Congestive Heart Failure, Diabetes, Fatigue, Hypertension, Obesity  Epworth Sleepiness Score: 12  SLEEP STUDY TECHNIQUE As per the AASM Manual for the Scoring of Sleep and Associated Events v2.3 (April 2016) with a hypopnea requiring 4% desaturations.  The channels recorded and monitored were frontal, central and occipital EEG, electrooculogram (EOG), submentalis EMG (chin), nasal and oral airflow, thoracic and abdominal wall motion, anterior tibialis EMG, snore microphone, electrocardiogram, and pulse oximetry.  MEDICATIONS Medications self-administered by patient taken the night of the study : METOPROLOL TARTRATE, HYDRALAZINE  SLEEP ARCHITECTURE The study was initiated at 10:56:14 PM and ended at 5:00:29 AM.  Sleep onset time was 357.6 minutes and the sleep efficiency was 1.2%%. The total sleep time was 4.5 minutes.  Stage REM latency was N/A minutes.  The patient spent 33.3%% of the night in stage N1 sleep, 66.7%% in stage N2 sleep, 0.0%% in stage N3 and 0% in REM.  Alpha intrusion was absent.  Supine sleep was 100.00%.  RESPIRATORY PARAMETERS The overall apnea/hypopnea index (AHI) was 0.0 per hour. There were 0 total apneas, including 0 obstructive, 0 central and 0 mixed apneas. There were 0 hypopneas and 0 RERAs.  The AHI during Stage REM sleep was N/A per hour.  AHI while supine was 0.0 per hour.  The mean oxygen saturation was 96.5%. The minimum SpO2 during sleep was 95.0%.  snoring was noted during this study.  CARDIAC DATA The 2 lead EKG demonstrated sinus rhythm. The mean heart rate was 55.7  beats per minute. Other EKG findings include: None.  LEG MOVEMENT DATA The total PLMS were 0 with a resulting PLMS index of 0.0. Associated arousal with leg movement index was 0.0 .  IMPRESSIONS - She slept for only 4.5 minutes during this study.  No assessment can be made from such limited sleep time. - She wore 3 liters oxygen during this study.  DIAGNOSIS - Nocturnal Hypoxemia (G47.36)  RECOMMENDATIONS - She could have the sleep study rescheduled and have her use a sleep aide medication on the night of the study. - Avoid alcohol, sedatives and other CNS depressants that may worsen sleep apnea and disrupt normal sleep architecture. - Sleep hygiene should be reviewed to assess factors that may improve sleep quality. - Weight management and regular exercise should be initiated or continued if appropriate.  [Electronically signed] 10/06/2021 01:30 PM  Chesley Mires MD, ABSM Diplomate, American Board of Sleep Medicine NPI: [id number]  [place] PH: [phone]   FX: [phone] [place]

## 2021-10-11 ENCOUNTER — Telehealth: Payer: Self-pay | Admitting: *Deleted

## 2021-10-11 NOTE — Telephone Encounter (Signed)
Called pt and there was no answer- LMTCB and forwarding to front desk pool per protocol

## 2021-10-11 NOTE — Telephone Encounter (Signed)
-----  Message from [place], MD sent at 10/06/2021  1:44 PM EDT ----- Regarding: Schedule follow-up Patient seen in Sept 2022. No recall placed or PFTs scheduled.   Please schedule patient for follow-up with me in November or December with PFTs prior to appt

## 2021-10-12 ENCOUNTER — Other Ambulatory Visit: Payer: Self-pay

## 2021-10-12 ENCOUNTER — Inpatient Hospital Stay: Payer: Medicare Other | Attending: Hematology & Oncology

## 2021-10-12 VITALS — BP 145/45 | HR 66 | Temp 98.1°F | Resp 20

## 2021-10-12 DIAGNOSIS — K7581 Nonalcoholic steatohepatitis (NASH): Secondary | ICD-10-CM | POA: Insufficient documentation

## 2021-10-12 DIAGNOSIS — Z79899 Other long term (current) drug therapy: Secondary | ICD-10-CM | POA: Diagnosis not present

## 2021-10-12 DIAGNOSIS — N189 Chronic kidney disease, unspecified: Secondary | ICD-10-CM | POA: Insufficient documentation

## 2021-10-12 DIAGNOSIS — E1165 Type 2 diabetes mellitus with hyperglycemia: Secondary | ICD-10-CM | POA: Insufficient documentation

## 2021-10-12 DIAGNOSIS — I129 Hypertensive chronic kidney disease with stage 1 through stage 4 chronic kidney disease, or unspecified chronic kidney disease: Secondary | ICD-10-CM | POA: Insufficient documentation

## 2021-10-12 DIAGNOSIS — D6959 Other secondary thrombocytopenia: Secondary | ICD-10-CM | POA: Diagnosis not present

## 2021-10-12 DIAGNOSIS — D631 Anemia in chronic kidney disease: Secondary | ICD-10-CM | POA: Insufficient documentation

## 2021-10-12 DIAGNOSIS — K7460 Unspecified cirrhosis of liver: Secondary | ICD-10-CM | POA: Insufficient documentation

## 2021-10-12 DIAGNOSIS — D509 Iron deficiency anemia, unspecified: Secondary | ICD-10-CM | POA: Insufficient documentation

## 2021-10-12 DIAGNOSIS — D500 Iron deficiency anemia secondary to blood loss (chronic): Secondary | ICD-10-CM

## 2021-10-12 MED ORDER — SODIUM CHLORIDE 0.9 % IV SOLN
1000.0000 mg | Freq: Once | INTRAVENOUS | Status: AC
  Administered 2021-10-12: 1000 mg via INTRAVENOUS
  Filled 2021-10-12: qty 10

## 2021-10-12 MED ORDER — SODIUM CHLORIDE 0.9 % IV SOLN: Freq: Once | INTRAVENOUS | Status: AC

## 2021-10-12 NOTE — Patient Instructions (Signed)
Ferric Derisomaltose Injection What is this medication? FERRIC DERISOMALTOSE (FER ik der EYE soe MAWL tose) treats low levels of iron in your body (iron deficiency anemia). Iron is a mineral that plays an important role in making red blood cells, which carry oxygen from your lungs to the rest of your body. This medicine may be used for other purposes; ask your health care provider or pharmacist if you have questions. COMMON BRAND NAME(S): MONOFERRIC What should I tell my care team before I take this medication? They need to know if you have any of these conditions: High levels of iron in the blood An unusual or allergic reaction to iron, other medications, foods, dyes, or preservatives Pregnant or trying to get pregnant Breast-feeding How should I use this medication? This medication is for injection into a vein. It is given in a hospital or clinic setting. Talk to your care team about the use of this medication in children. Special care may be needed. Overdosage: If you think you have taken too much of this medicine contact a poison control center or emergency room at once. NOTE: This medicine is only for you. Do not share this medicine with others. What if I miss a dose? It is important not to miss your dose. Call your care team if you are unable to keep an appointment. What may interact with this medication? Do not take this medication with any of the following: Deferoxamine Dimercaprol Other iron products This list may not describe all possible interactions. Give your health care provider a list of all the medicines, herbs, non-prescription drugs, or dietary supplements you use. Also tell them if you smoke, drink alcohol, or use illegal drugs. Some items may interact with your medicine. What should I watch for while using this medication? Visit your care team regularly. Tell your care team if your symptoms do not start to get better or if they get worse. You may need blood work done  while you are taking this medication. You may need to follow a special diet. Talk to your care team. Foods that contain iron include whole grains/cereals, dried fruits, beans, or peas, leafy green vegetables, and organ meats (liver, kidney). What side effects may I notice from receiving this medication? Side effects that you should report to your care team as soon as possible: Allergic reactions--skin rash, itching, hives, swelling of the face, lips, tongue, or throat Low blood pressure--dizziness, feeling faint or lightheaded, blurry vision Shortness of breath Side effects that usually do not require medical attention (report to your care team if they continue or are bothersome): Flushing Headache Joint pain Muscle pain Nausea Pain, redness, or irritation at injection site This list may not describe all possible side effects. Call your doctor for medical advice about side effects. You may report side effects to FDA at 1-800-FDA-1088. Where should I keep my medication? This medication is given in a hospital or clinic and will not be stored at home. NOTE: This sheet is a summary. It may not cover all possible information. If you have questions about this medicine, talk to your doctor, pharmacist, or health care provider.  2022 Elsevier/Gold Standard (2021-04-15 00:00:00)

## 2021-10-13 ENCOUNTER — Telehealth: Payer: Self-pay | Admitting: Family Medicine

## 2021-10-13 DIAGNOSIS — R32 Unspecified urinary incontinence: Secondary | ICD-10-CM

## 2021-10-13 MED ORDER — OXYBUTYNIN CHLORIDE 5 MG PO TABS: 5.0000 mg | ORAL_TABLET | Freq: Every day | ORAL | 1 refills | Status: DC

## 2021-10-13 NOTE — Telephone Encounter (Signed)
Refill sent

## 2021-10-13 NOTE — Telephone Encounter (Signed)
Medication: oxybutynin (DITROPAN) 5 MG tablet   Has the patient contacted their pharmacy? Yes.     Preferred Pharmacy: [facility], [address] [place] [address]  Phone:  [phone]  Fax:  [phone]

## 2021-10-14 NOTE — Telephone Encounter (Signed)
Patient is scheduled for PFT with rov following on 12/20/2020 at 3pm.

## 2021-10-18 NOTE — Telephone Encounter (Signed)
Error.

## 2021-10-24 ENCOUNTER — Other Ambulatory Visit: Payer: Self-pay | Admitting: Hematology & Oncology

## 2021-10-25 ENCOUNTER — Inpatient Hospital Stay: Payer: Medicare Other

## 2021-10-25 ENCOUNTER — Encounter: Payer: Self-pay | Admitting: Hematology & Oncology

## 2021-10-25 ENCOUNTER — Ambulatory Visit: Payer: Medicare Other

## 2021-10-25 ENCOUNTER — Inpatient Hospital Stay (HOSPITAL_BASED_OUTPATIENT_CLINIC_OR_DEPARTMENT_OTHER): Payer: Medicare Other | Admitting: Hematology & Oncology

## 2021-10-25 ENCOUNTER — Other Ambulatory Visit: Payer: Self-pay

## 2021-10-25 ENCOUNTER — Telehealth: Payer: Self-pay

## 2021-10-25 VITALS — BP 135/69 | HR 71 | Temp 98.3°F | Resp 20 | Wt 195.0 lb

## 2021-10-25 DIAGNOSIS — N184 Chronic kidney disease, stage 4 (severe): Secondary | ICD-10-CM

## 2021-10-25 DIAGNOSIS — K7581 Nonalcoholic steatohepatitis (NASH): Secondary | ICD-10-CM | POA: Diagnosis not present

## 2021-10-25 DIAGNOSIS — Z79899 Other long term (current) drug therapy: Secondary | ICD-10-CM | POA: Diagnosis not present

## 2021-10-25 DIAGNOSIS — I129 Hypertensive chronic kidney disease with stage 1 through stage 4 chronic kidney disease, or unspecified chronic kidney disease: Secondary | ICD-10-CM | POA: Diagnosis not present

## 2021-10-25 DIAGNOSIS — D6959 Other secondary thrombocytopenia: Secondary | ICD-10-CM | POA: Diagnosis not present

## 2021-10-25 DIAGNOSIS — D631 Anemia in chronic kidney disease: Secondary | ICD-10-CM

## 2021-10-25 DIAGNOSIS — D696 Thrombocytopenia, unspecified: Secondary | ICD-10-CM

## 2021-10-25 DIAGNOSIS — D500 Iron deficiency anemia secondary to blood loss (chronic): Secondary | ICD-10-CM

## 2021-10-25 DIAGNOSIS — D509 Iron deficiency anemia, unspecified: Secondary | ICD-10-CM | POA: Diagnosis not present

## 2021-10-25 DIAGNOSIS — K7460 Unspecified cirrhosis of liver: Secondary | ICD-10-CM | POA: Diagnosis not present

## 2021-10-25 LAB — RETICULOCYTES
Immature Retic Fract: 10 % (ref 2.3–15.9)
RBC.: 3.9 MIL/uL (ref 3.87–5.11)
Retic Count, Absolute: 96.7 10*3/uL (ref 19.0–186.0)
Retic Ct Pct: 2.5 % (ref 0.4–3.1)

## 2021-10-25 LAB — CMP (CANCER CENTER ONLY)
ALT: 17 U/L (ref 0–44)
AST: 20 U/L (ref 15–41)
Albumin: 4.1 g/dL (ref 3.5–5.0)
Alkaline Phosphatase: 58 U/L (ref 38–126)
Anion gap: 7 (ref 5–15)
BUN: 54 mg/dL — ABNORMAL HIGH (ref 8–23)
CO2: 35 mmol/L — ABNORMAL HIGH (ref 22–32)
Calcium: 9.9 mg/dL (ref 8.9–10.3)
Chloride: 96 mmol/L — ABNORMAL LOW (ref 98–111)
Creatinine: 1.39 mg/dL — ABNORMAL HIGH (ref 0.44–1.00)
GFR, Estimated: 37 mL/min — ABNORMAL LOW (ref >60)
Glucose, Bld: 307 mg/dL — ABNORMAL HIGH (ref 70–99)
Potassium: 4.6 mmol/L (ref 3.5–5.1)
Sodium: 138 mmol/L (ref 135–145)
Total Bilirubin: 0.5 mg/dL (ref 0.3–1.2)
Total Protein: 7 g/dL (ref 6.5–8.1)

## 2021-10-25 LAB — CBC WITH DIFFERENTIAL (CANCER CENTER ONLY)
Abs Immature Granulocytes: 0.08 10*3/uL — ABNORMAL HIGH (ref 0.00–0.07)
Basophils Absolute: 0 10*3/uL (ref 0.0–0.1)
Basophils Relative: 1 %
Eosinophils Absolute: 0.1 10*3/uL (ref 0.0–0.5)
Eosinophils Relative: 2 %
HCT: 35.5 % — ABNORMAL LOW (ref 36.0–46.0)
Hemoglobin: 11.4 g/dL — ABNORMAL LOW (ref 12.0–15.0)
Immature Granulocytes: 1 %
Lymphocytes Relative: 11 %
Lymphs Abs: 0.7 10*3/uL (ref 0.7–4.0)
MCH: 29.5 pg (ref 26.0–34.0)
MCHC: 32.1 g/dL (ref 30.0–36.0)
MCV: 91.7 fL (ref 80.0–100.0)
Monocytes Absolute: 0.7 10*3/uL (ref 0.1–1.0)
Monocytes Relative: 11 %
Neutro Abs: 5 10*3/uL (ref 1.7–7.7)
Neutrophils Relative %: 74 %
Platelet Count: 103 10*3/uL — ABNORMAL LOW (ref 150–400)
RBC: 3.87 MIL/uL (ref 3.87–5.11)
RDW: 15.7 % — ABNORMAL HIGH (ref 11.5–15.5)
WBC Count: 6.7 10*3/uL (ref 4.0–10.5)
nRBC: 0 % (ref 0.0–0.2)

## 2021-10-25 NOTE — Telephone Encounter (Signed)
  Care Management   Follow Up Note   10/25/2021 Name: MARGRETTA ZAMORANO MRN: [id number] DOB: Feb 22, 1935   Referred by: Ann Held, DO Reason for referral : Chronic Care Management (RNCM follow up)   Successful contact was made with patient. However, it was not a good time to talk.  Follow Up Plan: The care management team will reach out to the patient again over the next 30 days.   Thea Silversmith, RN, MSN, BSN, CCM Care Management Coordinator [facility] [phone]

## 2021-10-25 NOTE — Progress Notes (Signed)
Hematology and Oncology Follow Up Visit  Margaret Dyer [id number] [birth year] 86 y.o. 10/25/2021   Principle Diagnosis:  Thrombocytopenia secondary to NASH Iron deficiency anemia --iron malabsorption Erythropoietin deficiency anemia  Current Therapy:   Monoferric --dose given on 10/12/2021     Interim History:  [name] is back for follow-up.  When we last saw her back in October, her iron saturation was only 16%.  We did go ahead and give her a dose of IV iron.  This is helped quite a bit.  Her hemoglobin is now at 11.4.  Her erythropoietin level is only 19.  As such, if we cannot get her hemoglobin higher, we might be able to utilize ESA.  Her blood sugars horrible today.  Her blood sugar is 307.  She says that she is trying to manage her carb intake.  She will see her endocrinologist hopefully soon.  She does have the supplemental oxygen.  She has a little bit more stamina now that her blood count is little bit higher..  She has a lot of bruising on her arms.  She does have mild thrombocytopenia.  She is on baby aspirin.  She has had no issues with nausea or vomiting.  She has had no change in bowel or bladder habits.  She does urinate quite a bit which I am sure is probably from hyperglycemia.  She has had no problems with fever.  Thankfully, she has had no problems with COVID.  Overall, I would say her performance status is ECOG 2.     Medications:  Current Outpatient Medications:    ACCU-CHEK GUIDE test strip, CHECK BLOOD GLUCOSE FOUR TIMES DAILY, Disp: , Rfl:    Accu-Chek Softclix Lancets lancets, Check BG 4 times/day.  Dx E11.65, Disp: , Rfl:    albuterol (PROVENTIL) (2.5 MG/3ML) 0.083% nebulizer solution, Take 3 mLs (2.5 mg total) by nebulization every 6 (six) hours as needed for wheezing or shortness of breath., Disp: 75 mL, Rfl: 12   aspirin EC 81 MG EC tablet, Take 1 tablet (81 mg total) by mouth daily., Disp:  , Rfl:    docusate sodium (COLACE) 100 MG capsule, Take  1 capsule (100 mg total) by mouth 2 (two) times daily., Disp: 10 capsule, Rfl: 0   Ezetimibe-Simvastatin (VYTORIN PO), Take 1 tablet by mouth daily., Disp: , Rfl:    hydrALAZINE (APRESOLINE) 50 MG tablet, Take 1 tablet (50 mg total) by mouth every 8 (eight) hours., Disp: 270 tablet, Rfl: 3   insulin glargine (LANTUS) 100 UNIT/ML injection, Inject 0.25 mLs (25 Units total) into the skin at bedtime., Disp: , Rfl:    Insulin Glulisine (APIDRA SOLOSTAR) 100 UNIT/ML Solostar Pen, Inject 10-15 Units into the skin 3 (three) times daily before meals. , Disp: , Rfl:    ipratropium-albuterol (DUONEB) 0.5-2.5 (3) MG/3ML SOLN, Take 3 mLs by nebulization 3 (three) times daily., Disp: 360 mL, Rfl: 3   levothyroxine (SYNTHROID, LEVOTHROID) 25 MCG tablet, take 1 tablet by mouth daily (Patient taking differently: Take 25 mcg by mouth. Daily except none on Saturdays and Sundays), Disp: 30 tablet, Rfl: 5   LORazepam (ATIVAN) 0.5 MG tablet, Take 1 tablet (0.5 mg total) by mouth daily as needed for anxiety. (Patient not taking: No sig reported), Disp: 30 tablet, Rfl: 1   metoprolol tartrate (LOPRESSOR) 25 MG tablet, Take 1 tablet (25 mg total) by mouth 2 (two) times daily., Disp: 180 tablet, Rfl: 3   Multiple Vitamins-Minerals (CENTRUM SILVER ULTRA WOMENS PO), Take 1  tablet by mouth at bedtime. , Disp: , Rfl:    oxybutynin (DITROPAN) 5 MG tablet, Take 1 tablet (5 mg total) by mouth daily., Disp: 90 tablet, Rfl: 1   polyethylene glycol (MIRALAX / GLYCOLAX) 17 g packet, Take 17 g by mouth daily as needed., Disp: , Rfl:    potassium chloride SA (KLOR-CON) 20 MEQ tablet, TAKE 1 TABLET BY MOUTH DAILY AT BEDTIME, Disp: 30 tablet, Rfl: 11   sucralfate (CARAFATE) 1 g tablet, Take 1 tablet (1 g total) by mouth 4 (four) times daily -  with meals and at bedtime. (Patient not taking: Reported on 09/19/2021), Disp: 90 tablet, Rfl: 0   torsemide (DEMADEX) 20 MG tablet, Take 2 tablets (14m) by mouth in the morning and 1 tablet (297m in  the afternoon., Disp: 225 tablet, Rfl: 3   traMADol (ULTRAM) 50 MG tablet, Take 1 tablet (50 mg total) by mouth every 6 (six) hours as needed for moderate pain., Disp: 60 tablet, Rfl: 1  Allergies:  Allergies  Allergen Reactions   Other Other (See Comments)    Patient has hemorrhaged at least 4 times in her lifetime   Bactrim [Sulfamethoxazole-Trimethoprim]    Cefuroxime Axetil Nausea Only    Upset stomach    Ciprofloxacin Nausea Only    Upset stomach    Past Medical History, Surgical history, Social history, and Family History were reviewed and updated.  Review of Systems: Review of Systems  Constitutional:  Positive for fatigue.  HENT:  Negative.    Eyes: Negative.   Respiratory: Negative.    Cardiovascular: Negative.   Gastrointestinal: Negative.   Endocrine: Negative.   Genitourinary: Negative.    Musculoskeletal:  Positive for arthralgias and back pain.  Neurological: Negative.   Hematological:  Bruises/bleeds easily.  Psychiatric/Behavioral: Negative.     Physical Exam:  vitals were not taken for this visit.   Wt Readings from Last 3 Encounters:  10/05/21 196 lb (88.9 kg)  09/19/21 196 lb (88.9 kg)  08/15/21 199 lb 12.8 oz (90.6 kg)    Physical Exam Vitals reviewed.  HENT:     Head: Normocephalic and atraumatic.  Eyes:     Pupils: Pupils are equal, round, and reactive to light.  Cardiovascular:     Rate and Rhythm: Normal rate and regular rhythm.     Heart sounds: Normal heart sounds.  Pulmonary:     Effort: Pulmonary effort is normal.     Breath sounds: Normal breath sounds.  Abdominal:     General: Bowel sounds are normal.     Palpations: Abdomen is soft.  Musculoskeletal:        General: No tenderness or deformity. Normal range of motion.     Cervical back: Normal range of motion.  Lymphadenopathy:     Cervical: No cervical adenopathy.  Skin:    General: Skin is warm and dry.     Findings: No erythema or rash.  Neurological:     Mental  Status: She is alert and oriented to person, place, and time.  Psychiatric:        Behavior: Behavior normal.        Thought Content: Thought content normal.        Judgment: Judgment normal.     Lab Results  Component Value Date   WBC 6.7 10/25/2021   HGB 11.4 (L) 10/25/2021   HCT 35.5 (L) 10/25/2021   MCV 91.7 10/25/2021   PLT 103 (L) 10/25/2021     Chemistry  Component Value Date/Time   NA 138 10/25/2021 1506   NA 138 07/20/2021 1622   K 4.6 10/25/2021 1506   CL 96 (L) 10/25/2021 1506   CO2 35 (H) 10/25/2021 1506   BUN 54 (H) 10/25/2021 1506   BUN 32 (H) 07/20/2021 1622   CREATININE 1.39 (H) 10/25/2021 1506   CREATININE 1.06 (H) 11/30/2017 1559   GLU 136 12/03/2015 0000      Component Value Date/Time   CALCIUM 9.9 10/25/2021 1506   ALKPHOS 58 10/25/2021 1506   AST 20 10/25/2021 1506   ALT 17 10/25/2021 1506   BILITOT 0.5 10/25/2021 1506      Impression and Plan: Ms. Balthazar is a very nice 86 year old white female.  She has NASH cirrhosis.  She has mild thrombocytopenia.  I looked at her blood smear under the microscope.  She certainly has large platelets.  This is a good sign that her bone marrow is functioning okay.  We will see what her iron levels are.  Hopefully, they are better.  I am glad that the IV iron did help her.  This is all about quality of life.  I just want her quality life to be better.  If we can keep her hemoglobin above 11, we should be able to improve her quality of life.  We will plan to get her back in about 4 weeks or so.  I would like to see her back before Christmas.   Volanda Napoleon, MD 11/22/20224:09 PM

## 2021-10-26 ENCOUNTER — Telehealth: Payer: Self-pay | Admitting: *Deleted

## 2021-10-26 LAB — IRON AND TIBC
Iron: 97 ug/dL (ref 41–142)
Saturation Ratios: 34 % (ref 21–57)
TIBC: 283 ug/dL (ref 236–444)
UIBC: 186 ug/dL (ref 120–384)

## 2021-10-26 LAB — FERRITIN: Ferritin: 448 ng/mL — ABNORMAL HIGH (ref 11–307)

## 2021-10-26 NOTE — Chronic Care Management (AMB) (Signed)
  Care Management   Note  10/26/2021 Name: Margaret Dyer MRN: [id number] DOB: 1935-09-26  Margaret Dyer is a 86 y.o. year old female who is a primary care patient of Ann Held, DO and is actively engaged with the care management team. I reached out to [name] by phone today to assist with re-scheduling a follow up visit with the RN Case Manager  Follow up plan: Unsuccessful telephone outreach attempt made. A HIPAA compliant phone message was left for the patient providing contact information and requesting a return call.   Julian Hy, [place] Management  Direct Dial: [phone]

## 2021-10-31 ENCOUNTER — Ambulatory Visit (INDEPENDENT_AMBULATORY_CARE_PROVIDER_SITE_OTHER): Payer: Medicare Other | Admitting: Internal Medicine

## 2021-10-31 ENCOUNTER — Encounter: Payer: Self-pay | Admitting: Internal Medicine

## 2021-10-31 ENCOUNTER — Other Ambulatory Visit: Payer: Self-pay

## 2021-10-31 VITALS — BP 167/75 | HR 60 | Ht 63.0 in | Wt 196.8 lb

## 2021-10-31 DIAGNOSIS — I10 Essential (primary) hypertension: Secondary | ICD-10-CM

## 2021-10-31 DIAGNOSIS — I5032 Chronic diastolic (congestive) heart failure: Secondary | ICD-10-CM

## 2021-10-31 DIAGNOSIS — R0602 Shortness of breath: Secondary | ICD-10-CM | POA: Diagnosis not present

## 2021-10-31 DIAGNOSIS — R0902 Hypoxemia: Secondary | ICD-10-CM | POA: Diagnosis not present

## 2021-10-31 MED ORDER — AMLODIPINE BESYLATE 5 MG PO TABS: 5.0000 mg | ORAL_TABLET | Freq: Every day | ORAL | 3 refills | Status: DC

## 2021-10-31 MED ORDER — TORSEMIDE 20 MG PO TABS: ORAL_TABLET | ORAL | 3 refills | Status: AC

## 2021-10-31 NOTE — Progress Notes (Signed)
OFFICE NOTE  Chief Complaint:  Follow-up heart failure  Primary Care Physician: Carollee Herter, Alferd Apa, DO  HPI:  Margaret Dyer is a 86 y.o. female with a past medial history significant for tobacco abuse, hypertension, dyslipidemia, cirrhosis, diabetes type 2 and lower extremity edema. She presented the hospital in June 2018 with several weeks of dyspnea on exertion and 10-15 pound weight gain. She had bilateral pleural effusions and an elevated BNP of 219. Echo showed an EF of 60-65% with grade 2 diastolic dysfunction. She underwent right heart catheterization by Dr. Ellyn Hack on 05/18/2017 which showed severe diastolic heart failure and an elevated pulmonary capillary wedge pressure 32 mmHg. There is also felt to be component of pulmonary arterial hypertension. Ultimately she had a [employer] which showed no reversible ischemia. This was performed due to history of coronary calcifications. She also has chronic kidney disease and we preferred to avoid any contrast dye exposure such as with left heart catheterization. She was seen in follow-up by Sharrell Ku, PA-C in July who ordered her stress test and felt that she was doing well from cardiac standpoint. Her weight was 203 pounds. Today it's 200 pounds. She continues to take her diuretic and feels like she is doing well without any worsening shortness of breath.  05/03/2018  Margaret Dyer returns today for follow-up.  Overall she is doing well.  Her weight is significantly lower than it was during the hospital.  Now down about 8 to 10 pounds.  She has been getting some occasional dizziness.  This could be worse with change in position or bending over.  She has a history of neuropathy which can contribute to this and does have a new patient visit with neurology at the end of June.  She is also on twice daily Lasix and I wonder if she could be somewhat over diuresed.  She has no issues with edema.  She is also had some concerns about knots in her legs  which could be varicose veins.  There are signs of bilateral lower extremity venous insufficiency.  11/08/2020  Margaret Dyer is seen today in follow-up. She was last seen last summer by Doreene Adas, PA-C. Overall she seems to have been doing pretty well. Her blood pressure was not well controlled and she was started on lisinopril. This did cause her cough and ultimately she was switched over to losartan. She does have an issue with some degree of chronic kidney disease. She denies any worsening shortness of breath or edema. Blood pressure is again elevated today. She says she generally checks it at home perhaps on a weekly basis and gets around 696 systolic. She says she is not had any recent labs and they were last performed in 2020. She also says is difficult for her to get to her primary care provider's office would like lab work checked today. She is not fasting.  07/20/2021  Margaret Dyer returns today for follow-up.  I just saw her via virtual visit.  She was having worsening shortness of breath and hypoxemia.  Was felt that she was also having more swelling and I increased her torsemide to 20 mg twice a day.  She does not necessarily notice significant improvement in her breathing or reduction in her swelling or weight.  Her weight today actually is about similar to what it was a month ago.  She says her home weights have been fairly stable as well.  Blood pressure remains elevated today 153/66 however at home up  to about 973 systolic.  She reports some good response to hydralazine however only on the low-dose 3 times a day.  10/31/2021  Margaret Dyer is seen today for follow-up.  She reports fairly stable shortness of breath.  She had been seen recently by pulmonary for sleep study but was not able to sleep in for the study to be diagnostic.  She is on 3 L oxygen.  She has an upcoming pulmonary function test in follow-up in January.  She reports some issues with her nose particular related to deviated  septum after breaking it in the past.  She has never seen an ENT.  This could be contributing to some of her shortness of breath.  She is interested in a referral to a [facility] provider due to transportation issues.  She says she sometimes misses her evening dose of torsemide.  I had recently increased her hydralazine to 50 mg 3 times daily but blood pressure remains elevated.  She reports compliance with the medicine.  PMHx:  Past Medical History:  Diagnosis Date   Asthma    Blood transfusion    Chronic diastolic CHF (congestive heart failure) (HCC)    CKD (chronic kidney disease), stage III (HCC)    Coronary artery calcification seen on CT scan    Diabetes mellitus    Erythropoietin deficiency anemia 09/19/2021   Goiter    Hyperlipidemia    Hypertension    Hypothyroidism    Iron deficiency anemia due to chronic blood loss 09/19/2021   Liver cirrhosis (HCC)    NASH (nonalcoholic steatohepatitis)    Obesity    Osteopenia    Pulmonary hypertension (HCC)    Renal cell carcinoma    a. prior nephrectomy.   Right heart failure (HCC)    Thrombocytopenia ([place])     Past Surgical History:  Procedure Laterality Date   ABDOMINAL HYSTERECTOMY     APPENDECTOMY     HEMORRHOID SURGERY     INTRAMEDULLARY (IM) NAIL INTERTROCHANTERIC Left 12/25/2018   Procedure: INTRAMEDULLARY (IM) NAIL INTERTROCHANTRIC;  Surgeon: Nicholes Stairs, MD;  Location: [facility];  Service: Orthopedics;  Laterality: Left;   KNEE SURGERY     NEPHRECTOMY  09.2000    FAMHx:  Family History  Problem Relation Age of Onset   Cancer Sister        bladder   Kidney cancer Father    Cancer Father        renal   COPD Father    Congestive Heart Failure Father    Coronary artery disease Other    Diabetes Other    Hyperlipidemia Other    Hypertension Other    Rheumatologic disease Neg Hx     SOCHx:   reports that she quit smoking about 47 years ago. Her smoking use included cigarettes. She smoked an average of  .25 packs per day. She has never used smokeless tobacco. She reports that she does not drink alcohol and does not use drugs.  ALLERGIES:  Allergies  Allergen Reactions   Other Other (See Comments)    Patient has hemorrhaged at least 4 times in her lifetime   Bactrim [Sulfamethoxazole-Trimethoprim]    Cefuroxime Axetil Nausea Only    Upset stomach    Ciprofloxacin Nausea Only    Upset stomach    ROS: Pertinent items noted in HPI and remainder of comprehensive ROS otherwise negative.  HOME MEDS: Current Outpatient Medications on File Prior to Visit  Medication Sig Dispense Refill   ACCU-CHEK GUIDE test  strip CHECK BLOOD GLUCOSE FOUR TIMES DAILY     Accu-Chek Softclix Lancets lancets Check BG 4 times/day.  Dx E11.65     albuterol (PROVENTIL) (2.5 MG/3ML) 0.083% nebulizer solution Take 3 mLs (2.5 mg total) by nebulization every 6 (six) hours as needed for wheezing or shortness of breath. 75 mL 12   aspirin EC 81 MG EC tablet Take 1 tablet (81 mg total) by mouth daily.     docusate sodium (COLACE) 100 MG capsule Take 1 capsule (100 mg total) by mouth 2 (two) times daily. 10 capsule 0   Ezetimibe-Simvastatin (VYTORIN PO) Take 1 tablet by mouth daily.     hydrALAZINE (APRESOLINE) 50 MG tablet Take 1 tablet (50 mg total) by mouth every 8 (eight) hours. 270 tablet 3   insulin glargine (LANTUS) 100 UNIT/ML injection Inject 0.25 mLs (25 Units total) into the skin at bedtime.     Insulin Glulisine (APIDRA SOLOSTAR) 100 UNIT/ML Solostar Pen Inject 10-15 Units into the skin 3 (three) times daily before meals.      ipratropium-albuterol (DUONEB) 0.5-2.5 (3) MG/3ML SOLN Take 3 mLs by nebulization 3 (three) times daily. 360 mL 3   levothyroxine (SYNTHROID, LEVOTHROID) 25 MCG tablet take 1 tablet by mouth daily (Patient taking differently: Take 25 mcg by mouth. Daily except none on Saturdays and Sundays) 30 tablet 5   LORazepam (ATIVAN) 0.5 MG tablet Take 1 tablet (0.5 mg total) by mouth daily as  needed for anxiety. 30 tablet 1   metoprolol tartrate (LOPRESSOR) 25 MG tablet Take 1 tablet (25 mg total) by mouth 2 (two) times daily. 180 tablet 3   Multiple Vitamins-Minerals (CENTRUM SILVER ULTRA WOMENS PO) Take 1 tablet by mouth at bedtime.      oxybutynin (DITROPAN) 5 MG tablet Take 1 tablet (5 mg total) by mouth daily. 90 tablet 1   polyethylene glycol (MIRALAX / GLYCOLAX) 17 g packet Take 17 g by mouth daily as needed.     potassium chloride SA (KLOR-CON) 20 MEQ tablet TAKE 1 TABLET BY MOUTH DAILY AT BEDTIME 30 tablet 11   sucralfate (CARAFATE) 1 g tablet Take 1 tablet (1 g total) by mouth 4 (four) times daily -  with meals and at bedtime. 90 tablet 0   torsemide (DEMADEX) 20 MG tablet Take 2 tablets (71m) by mouth in the morning and 1 tablet (240m in the afternoon. 225 tablet 3   traMADol (ULTRAM) 50 MG tablet Take 1 tablet (50 mg total) by mouth every 6 (six) hours as needed for moderate pain. 60 tablet 1   No current facility-administered medications on file prior to visit.    LABS/IMAGING: No results found for this or any previous visit (from the past 48 hour(s)). No results found.  LIPID PANEL:    Component Value Date/Time   CHOL 132 06/09/2021 1511   CHOL 122 11/08/2020 1638   TRIG 81.0 06/09/2021 1511   HDL 44.40 06/09/2021 1511   HDL 45 11/08/2020 1638   CHOLHDL 3 06/09/2021 1511   VLDL 16.2 06/09/2021 1511   LDLCALC 72 06/09/2021 1511   LDLCALC 58 11/08/2020 1638   LDLDIRECT 89.0 01/19/2015 1659     WEIGHTS: Wt Readings from Last 3 Encounters:  10/31/21 196 lb 12.8 oz (89.3 kg)  10/25/21 195 lb (88.5 kg)  10/05/21 196 lb (88.9 kg)    VITALS: BP (!) 167/75   Pulse 60   Ht 5' 3" (1.6 m)   Wt 196 lb 12.8 oz (89.3 kg)   LMP  (  LMP Unknown)   SpO2 94%   BMI 34.86 kg/m   EXAM: General appearance: alert, no distress and moderately obese Neck: no carotid bruit, no JVD and thyroid not enlarged, symmetric, no tenderness/mass/nodules Lungs: diminished  breath sounds bilaterally Heart: regular rate and rhythm Abdomen: soft, non-tender; bowel sounds normal; no masses,  no organomegaly Extremities: extremities normal, atraumatic, no cyanosis or edema Pulses: 2+ and symmetric Skin: Skin color, texture, turgor normal. No rashes or lesions Neurologic: Grossly normal Psych: Pleasant  EKG: Sinus rhythm with PACs at 60, poor R wave progression-personally reviewed  ASSESSMENT: Chronic diastolic congestive heart failure, LVEF 60-65% (05/2017) Nonischemic Myoview with EF 71% (06/2017) Hypertension Cirrhosis Insulin-dependent diabetes mellitus Dyslipidemia Hypothyroidism  PLAN: 1.   Mrs. Corvin seems to be stable with regards to her weight and heart failure.  Creatinine is actually a little lower than it was recently.  Potassium is normal.  She reports taking her morning torsemide but not necessarily the evening dose.  She is on the higher dose hydralazine but blood pressure still not controlled.  I would recommend adding amlodipine 5 mg daily to her regimen.  She follows up with pulmonary in January.  She is on oxygen.  She has a deviated nasal septum which could be an issue.  This has been bothersome for her and apparently she fell and had broken her nose in the past.  I will refer her to ENT, specifically Dr. Lucia Gaskins due to his [facility] affiliation since she needs transportation through the [facility] system.  Follow-up with me in 3 months.  Pixie Casino, MD, [facility], [place] Director of the Advanced Lipid Disorders &  Cardiovascular Risk Reduction Clinic Diplomate of the American Board of Clinical Lipidology Attending Cardiologist  Direct Dial: [phone]  Fax: [phone]  Website:  [email or web address] 10/31/2021, 4:03 PM

## 2021-10-31 NOTE — Patient Instructions (Signed)
Medication Instructions:   -Start amlodipine (norvasc) 58m once daily.  *If you need a refill on your cardiac medications before your next appointment, please call your pharmacy*   Follow-Up: At [facility] you and your health needs are our priority.  As part of our continuing mission to provide you with exceptional heart care, we have created designated Provider Care Teams.  These Care Teams include your primary Cardiologist (physician) and Advanced Practice Providers (APPs -  Physician Assistants and Nurse Practitioners) who all work together to provide you with the care you need, when you need it.  We recommend signing up for the patient portal called "MyChart".  Sign up information is provided on this After Visit Summary.  MyChart is used to connect with patients for Virtual Visits (Telemedicine).  Patients are able to view lab/test results, encounter notes, upcoming appointments, etc.  Non-urgent messages can be sent to your provider as well.   To learn more about what you can do with MyChart, go to [email or web address]    Your next appointment:   3 month(s)  The format for your next appointment:   In Person  Provider:   KPixie Casino MD

## 2021-11-04 NOTE — Chronic Care Management (AMB) (Signed)
  Care Management   Note  11/04/2021 Name: Margaret Dyer MRN: [id number] DOB: Mar 17, 1935  Margaret Dyer is a 86 y.o. year old female who is a primary care patient of Ann Held, DO and is actively engaged with the care management team. I reached out to [name] by phone today to assist with re-scheduling a follow up visit with the RN Case Manager  Follow up plan: 2nd Unsuccessful telephone outreach attempt made. A HIPAA compliant phone message was left for the patient providing contact information and requesting a return call.   Julian Hy, [place] Management  Direct Dial: [phone]

## 2021-11-15 NOTE — Chronic Care Management (AMB) (Signed)
Care Management   Note  11/15/2021 Name: Margaret Dyer MRN: [id number] DOB: 07-23-1935  Margaret Dyer is a 86 y.o. year old female who is a primary care patient of Ann Held, DO and is actively engaged with the care management team. I reached out to [name] by phone today to assist with re-scheduling a follow up visit with the RN Case Manager  Follow up plan: We have been unable to make contact with the patient for follow up. The care management team is available to follow up with the patient after provider conversation with the patient regarding recommendation for care management engagement and subsequent re-referral to the care management team.   Julian Hy, [place] Management  Direct Dial: [phone]

## 2021-11-16 ENCOUNTER — Telehealth: Payer: Self-pay | Admitting: Internal Medicine

## 2021-11-16 NOTE — Telephone Encounter (Signed)
Spoke with patient. She reports no change in her blood pressure since starting on Norvasc 11/03/21. She reports being "sleepy, hungry, and hollers out at night". She does not want to take Norvasc any more and does not want a calcium channel blocker and wants a medication that is not so expensive. She knows not to stop taking Norvasc until advised. Please advise.

## 2021-11-16 NOTE — Telephone Encounter (Signed)
Pt c/o medication issue:  1. Name of Medication:  amLODipine (NORVASC) 5 MG tablet  2. How are you currently taking this medication (dosage and times per day)?  As prescribed, 1 tablet once daily  3. Are you having a reaction (difficulty breathing--STAT)?  See below  4. What is your medication issue?   Patient states this medication is ineffective and her BP hasn't gone down. She states it is still staying around  169/75. She states she also feels horrible overall. He is constantly lightheaded and she feels hungry more often than usual. Please return call to discuss.

## 2021-11-17 ENCOUNTER — Other Ambulatory Visit: Payer: Self-pay

## 2021-11-17 ENCOUNTER — Ambulatory Visit (INDEPENDENT_AMBULATORY_CARE_PROVIDER_SITE_OTHER): Payer: Medicare Other

## 2021-11-17 DIAGNOSIS — I10 Essential (primary) hypertension: Secondary | ICD-10-CM

## 2021-11-17 DIAGNOSIS — E785 Hyperlipidemia, unspecified: Secondary | ICD-10-CM

## 2021-11-17 NOTE — Patient Instructions (Signed)
Visit Information  Thank you for allowing me to share the care management and care coordination services that are available to you as part of your health plan and services through your primary care provider and medical home. Please reach out to me at [phone] if the care management/care coordination team may be of assistance to you in the future.   Thea Silversmith, RN, MSN, BSN, CCM Care Management Coordinator [facility] [phone]

## 2021-11-17 NOTE — Chronic Care Management (AMB) (Signed)
Care Management   Follow Up Note   11/17/2021 Name: Margaret Dyer MRN: [id number] DOB: [birth year]   Referred by: Ann Held, DO Reason for referral : No chief complaint on file.    RNCM was notified of third unsuccessful telephone outreach by care guide. The patient was referred to the case management team for assistance with care management and care coordination. The patient's primary care provider has been notified of our unsuccessful attempts to make or maintain contact with the patient. The care management team is pleased to engage with this patient at any time in the future should he/she be interested in assistance from the care management team. [facility] will update clinical pharmacist that [facility] will no longer be following.  Follow Up Plan: No further follow up required:    Thea Silversmith, RN, MSN, BSN, CCM Care Management Coordinator [facility] [phone]

## 2021-11-18 ENCOUNTER — Telehealth: Payer: Self-pay

## 2021-11-18 MED ORDER — HYDRALAZINE HCL 25 MG PO TABS: ORAL_TABLET | ORAL | 3 refills | Status: DC

## 2021-11-18 NOTE — Telephone Encounter (Signed)
Spoke with patient.  Will have her discontinue amlodipine.  She is concerned about sudden withdrawal, so to ease her mind, will have her cut to 1/2 tablet (2.5 mg) daily for 1 week then stop.  Also will have her increase the hydralazine to 100 mg in the mornings and continue with 50 mg in afternoon and evening.  Patient states compliant with the tid dosing.

## 2021-11-18 NOTE — Telephone Encounter (Signed)
Patient called back. She has an appointment with Dr. Oval Linsey 01/11/2022 and was added to the wait list. She would like to know if there is any recommendation for her since her appointment is not soon.

## 2021-11-18 NOTE — Telephone Encounter (Signed)
Spoke with patient and advised Dr Oval Linsey has not seen her and would not be able to give any recommendations until after she is seen. Has been placed on wait list  Amlodipine makes her feel funny when she gets up, "weird feeling"  Increased sleeping  Not bring blood pressure down  Wants to stop Amlodipine  Blood pressure running around 169/75  Offered appointment with Pharm D for next Tuesday, unable to make that appointment   Will forward to Pharm D for review

## 2021-11-18 NOTE — Telephone Encounter (Signed)
Left message for patient to call back. This is regarding HTN clinic order.

## 2021-11-18 NOTE — Telephone Encounter (Signed)
Spoke with patient and explained about the referral I sent to the HTN clinic with dr. Oval Linsey. Patient will call the HTN clinic to follow up for an appointment.

## 2021-11-25 ENCOUNTER — Ambulatory Visit: Payer: Medicare Other | Admitting: Family

## 2021-11-25 ENCOUNTER — Telehealth: Payer: Self-pay | Admitting: Pharmacist Clinician (PhC)/ Clinical Pharmacy Specialist

## 2021-11-25 ENCOUNTER — Inpatient Hospital Stay: Payer: Medicare Other

## 2021-11-25 MED ORDER — HYDRALAZINE HCL 50 MG PO TABS: ORAL_TABLET | ORAL | 3 refills | Status: DC

## 2021-11-25 NOTE — Telephone Encounter (Signed)
Clarified information with patient.  Rx should have been for 50 mg tablets instead of 25 mg.  She will adjust the 25 mg dose to equal 100 mg in am, 50 mg mid-day and night.  New rx for 50 mg tablets sent to pharmacy

## 2021-11-25 NOTE — Telephone Encounter (Signed)
pt needing to clarify how often she is to take her hydrALAZINE the instructions given to the patient arent very clear. Please advise

## 2021-11-29 ENCOUNTER — Telehealth: Payer: Self-pay | Admitting: Family Medicine

## 2021-11-29 NOTE — Telephone Encounter (Signed)
Left message for patient to call back and schedule Medicare Annual Wellness Visit (AWV) in office.   If not able to come in office, please offer to do virtually or by telephone.  Left office number and my jabber [phone].  Due for AWVI  Please schedule at anytime with Nurse Health Advisor.

## 2021-11-30 ENCOUNTER — Other Ambulatory Visit: Payer: Self-pay | Admitting: Internal Medicine

## 2021-12-02 ENCOUNTER — Telehealth: Payer: Self-pay | Admitting: Internal Medicine

## 2021-12-02 NOTE — Telephone Encounter (Signed)
Spoke with patient. Notified her of other ENT options in [place]. She will research and call back next week with which she prefers referral to

## 2021-12-02 NOTE — Telephone Encounter (Signed)
Left message for patient to call back to discuss ENT referral ordered at last visit (10/31/21) Dr. Lucia Gaskins (ENT) office is closed. Per note, needs referral to ENT within [place]  Per provider search:  Rayfield Citizen, MD -- with [facility] network  OR   Dr. Benjamine Mola

## 2021-12-02 NOTE — Telephone Encounter (Signed)
Patient returning call.

## 2021-12-09 ENCOUNTER — Other Ambulatory Visit: Payer: Self-pay | Admitting: Family Medicine

## 2021-12-09 ENCOUNTER — Telehealth: Payer: Self-pay | Admitting: Family Medicine

## 2021-12-09 MED ORDER — AMOXICILLIN 875 MG PO TABS: 875.0000 mg | ORAL_TABLET | Freq: Two times a day (BID) | ORAL | 0 refills | Status: DC

## 2021-12-09 NOTE — Telephone Encounter (Signed)
Pt lives with daughter and daughter's roommate and they have gotten sick. Both daughter and roommate have gotten medication and feeling better but pt is starting to get sick now. Her symptoms include congestion, and cough, she is on oxygen and slightly more short of breath. She does not have a fever or any other symptoms. She has not taken a covid test, but roommate and daughter did and they were both negative. Daughter was wondering if we could get something called in before the weekend, as we have no availability and do not want this to progress. Pt has upcoming appt with Dr. Etter Sjogren 1/10.   [facility] - [place] [address] [place] [address]  Phone:  [phone]  Fax:  [phone]

## 2021-12-09 NOTE — Telephone Encounter (Signed)
Pt daughter called back in regarding situation below. I informed her that we do not have any openings. She said she is most concerned because her mom is on oxygen. She stated is there any OTC medications she can take to reduce symptoms. Pt daughter stated she has  Robitussin DM Mucinex as well. She wants to see are those okay to give her or anything else she can pick up. She doesn't want to give her any medication due to pt issues with high BP and Diabetes. She stated are there any signs she can look out for that would make her need to take her to be seen at a ER/UC.

## 2021-12-09 NOTE — Telephone Encounter (Signed)
Everyone is full and pt doesn't have Mychart for e-visit

## 2021-12-13 ENCOUNTER — Ambulatory Visit (INDEPENDENT_AMBULATORY_CARE_PROVIDER_SITE_OTHER): Payer: Medicare Other | Admitting: Family Medicine

## 2021-12-13 VITALS — BP 110/60 | HR 73 | Temp 98.3°F | Resp 20 | Ht 63.0 in | Wt 199.0 lb

## 2021-12-13 DIAGNOSIS — E1169 Type 2 diabetes mellitus with other specified complication: Secondary | ICD-10-CM

## 2021-12-13 DIAGNOSIS — E785 Hyperlipidemia, unspecified: Secondary | ICD-10-CM | POA: Diagnosis not present

## 2021-12-13 DIAGNOSIS — F419 Anxiety disorder, unspecified: Secondary | ICD-10-CM

## 2021-12-13 DIAGNOSIS — N184 Chronic kidney disease, stage 4 (severe): Secondary | ICD-10-CM

## 2021-12-13 DIAGNOSIS — H60501 Unspecified acute noninfective otitis externa, right ear: Secondary | ICD-10-CM | POA: Diagnosis not present

## 2021-12-13 DIAGNOSIS — J9611 Chronic respiratory failure with hypoxia: Secondary | ICD-10-CM | POA: Diagnosis not present

## 2021-12-13 DIAGNOSIS — B354 Tinea corporis: Secondary | ICD-10-CM | POA: Diagnosis not present

## 2021-12-13 DIAGNOSIS — E1165 Type 2 diabetes mellitus with hyperglycemia: Secondary | ICD-10-CM | POA: Diagnosis not present

## 2021-12-13 DIAGNOSIS — K7581 Nonalcoholic steatohepatitis (NASH): Secondary | ICD-10-CM

## 2021-12-13 DIAGNOSIS — K7460 Unspecified cirrhosis of liver: Secondary | ICD-10-CM

## 2021-12-13 DIAGNOSIS — D696 Thrombocytopenia, unspecified: Secondary | ICD-10-CM

## 2021-12-13 DIAGNOSIS — I5032 Chronic diastolic (congestive) heart failure: Secondary | ICD-10-CM

## 2021-12-13 DIAGNOSIS — E119 Type 2 diabetes mellitus without complications: Secondary | ICD-10-CM

## 2021-12-13 DIAGNOSIS — C649 Malignant neoplasm of unspecified kidney, except renal pelvis: Secondary | ICD-10-CM

## 2021-12-13 DIAGNOSIS — E039 Hypothyroidism, unspecified: Secondary | ICD-10-CM

## 2021-12-13 DIAGNOSIS — F32A Depression, unspecified: Secondary | ICD-10-CM

## 2021-12-13 DIAGNOSIS — I739 Peripheral vascular disease, unspecified: Secondary | ICD-10-CM | POA: Diagnosis not present

## 2021-12-13 DIAGNOSIS — I10 Essential (primary) hypertension: Secondary | ICD-10-CM

## 2021-12-13 DIAGNOSIS — S02401S Maxillary fracture, unspecified, sequela: Secondary | ICD-10-CM

## 2021-12-13 DIAGNOSIS — Z794 Long term (current) use of insulin: Secondary | ICD-10-CM

## 2021-12-13 MED ORDER — LORAZEPAM 0.5 MG PO TABS: 0.5000 mg | ORAL_TABLET | Freq: Every day | ORAL | 1 refills | Status: DC | PRN

## 2021-12-13 MED ORDER — OFLOXACIN 0.3 % OT SOLN: 10.0000 [drp] | Freq: Every day | OTIC | 0 refills | Status: DC

## 2021-12-13 MED ORDER — EZETIMIBE-SIMVASTATIN 10-20 MG PO TABS: 1.0000 | ORAL_TABLET | Freq: Every day | ORAL | 1 refills | Status: DC

## 2021-12-13 MED ORDER — NYSTATIN 100000 UNIT/GM EX CREA: 1.0000 "application " | TOPICAL_CREAM | Freq: Two times a day (BID) | CUTANEOUS | 3 refills | Status: DC

## 2021-12-13 NOTE — Telephone Encounter (Signed)
Med was sent in 01/06. Pt was called and lvm

## 2021-12-13 NOTE — Patient Instructions (Signed)
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
## 2021-12-14 LAB — CBC WITH DIFFERENTIAL/PLATELET
Basophils Absolute: 0.1 10*3/uL (ref 0.0–0.1)
Basophils Relative: 1.1 % (ref 0.0–3.0)
Eosinophils Absolute: 0.1 10*3/uL (ref 0.0–0.7)
Eosinophils Relative: 2.1 % (ref 0.0–5.0)
HCT: 36.7 % (ref 36.0–46.0)
Hemoglobin: 11.9 g/dL — ABNORMAL LOW (ref 12.0–15.0)
Lymphocytes Relative: 10.9 % — ABNORMAL LOW (ref 12.0–46.0)
Lymphs Abs: 0.7 10*3/uL (ref 0.7–4.0)
MCHC: 32.5 g/dL (ref 30.0–36.0)
MCV: 90.1 fl (ref 78.0–100.0)
Monocytes Absolute: 0.6 10*3/uL (ref 0.1–1.0)
Monocytes Relative: 10.5 % (ref 3.0–12.0)
Neutro Abs: 4.6 10*3/uL (ref 1.4–7.7)
Neutrophils Relative %: 75.4 % (ref 43.0–77.0)
Platelets: 106 10*3/uL — ABNORMAL LOW (ref 150.0–400.0)
RBC: 4.08 Mil/uL (ref 3.87–5.11)
RDW: 15.1 % (ref 11.5–15.5)
WBC: 6.1 10*3/uL (ref 4.0–10.5)

## 2021-12-14 LAB — COMPREHENSIVE METABOLIC PANEL
ALT: 15 U/L (ref 0–35)
AST: 19 U/L (ref 0–37)
Albumin: 4 g/dL (ref 3.5–5.2)
Alkaline Phosphatase: 54 U/L (ref 39–117)
BUN: 50 mg/dL — ABNORMAL HIGH (ref 6–23)
CO2: 37 mEq/L — ABNORMAL HIGH (ref 19–32)
Calcium: 9.3 mg/dL (ref 8.4–10.5)
Chloride: 94 mEq/L — ABNORMAL LOW (ref 96–112)
Creatinine, Ser: 1.44 mg/dL — ABNORMAL HIGH (ref 0.40–1.20)
GFR: 33.38 mL/min — ABNORMAL LOW (ref >60.00)
Glucose, Bld: 209 mg/dL — ABNORMAL HIGH (ref 70–99)
Potassium: 3.9 mEq/L (ref 3.5–5.1)
Sodium: 139 mEq/L (ref 135–145)
Total Bilirubin: 0.6 mg/dL (ref 0.2–1.2)
Total Protein: 6.6 g/dL (ref 6.0–8.3)

## 2021-12-14 LAB — LIPID PANEL
Cholesterol: 202 mg/dL — ABNORMAL HIGH (ref 0–200)
HDL: 44.5 mg/dL (ref >39.00)
LDL Cholesterol: 121 mg/dL — ABNORMAL HIGH (ref 0–99)
NonHDL: 157.84
Total CHOL/HDL Ratio: 5
Triglycerides: 186 mg/dL — ABNORMAL HIGH (ref 0.0–149.0)
VLDL: 37.2 mg/dL (ref 0.0–40.0)

## 2021-12-14 LAB — HEMOGLOBIN A1C: Hgb A1c MFr Bld: 6.4 % (ref 4.6–6.5)

## 2021-12-14 LAB — TSH: TSH: 0.94 u[IU]/mL (ref 0.35–5.50)

## 2021-12-15 ENCOUNTER — Telehealth: Payer: Self-pay | Admitting: Family Medicine

## 2021-12-18 ENCOUNTER — Encounter: Payer: Self-pay | Admitting: Family Medicine

## 2021-12-18 NOTE — Assessment & Plan Note (Signed)
Stable con't meds  F/u cardiology

## 2021-12-18 NOTE — Assessment & Plan Note (Signed)
Will need to f/u nephrology

## 2021-12-18 NOTE — Assessment & Plan Note (Signed)
Stable

## 2021-12-18 NOTE — Assessment & Plan Note (Signed)
Well controlled, no changes to meds. Encouraged heart healthy diet such as the DASH diet and exercise as tolerated.  °

## 2021-12-18 NOTE — Assessment & Plan Note (Signed)
Per endo

## 2021-12-18 NOTE — Assessment & Plan Note (Signed)
Encourage heart healthy diet such as MIND or DASH diet, increase exercise, avoid trans fats, simple carbohydrates and processed foods, consider a krill or fish or flaxseed oil cap daily.  °

## 2021-12-20 ENCOUNTER — Ambulatory Visit (INDEPENDENT_AMBULATORY_CARE_PROVIDER_SITE_OTHER): Payer: Medicare Other | Admitting: Pulmonary Disease

## 2021-12-20 ENCOUNTER — Other Ambulatory Visit: Payer: Self-pay

## 2021-12-20 VITALS — BP 150/70 | HR 71 | Ht 63.0 in | Wt 198.0 lb

## 2021-12-20 DIAGNOSIS — J9611 Chronic respiratory failure with hypoxia: Secondary | ICD-10-CM

## 2021-12-20 DIAGNOSIS — J984 Other disorders of lung: Secondary | ICD-10-CM | POA: Insufficient documentation

## 2021-12-20 DIAGNOSIS — R0602 Shortness of breath: Secondary | ICD-10-CM | POA: Diagnosis not present

## 2021-12-20 LAB — PULMONARY FUNCTION TEST
DL/VA % pred: 101 %
DL/VA: 4.14 ml/min/mmHg/L
DLCO cor % pred: 68 %
DLCO cor: 12.23 ml/min/mmHg
DLCO unc % pred: 64 %
DLCO unc: 11.63 ml/min/mmHg
FEF 25-75 Post: 1.75 L/sec
FEF 25-75 Pre: 1.88 L/sec
FEF2575-%Change-Post: -6 %
FEF2575-%Pred-Post: 152 %
FEF2575-%Pred-Pre: 164 %
FEV1-%Change-Post: 0 %
FEV1-%Pred-Post: 73 %
FEV1-%Pred-Pre: 73 %
FEV1-Post: 1.25 L
FEV1-Pre: 1.25 L
FEV1FVC-%Change-Post: 2 %
FEV1FVC-%Pred-Pre: 122 %
FEV6-%Change-Post: -3 %
FEV6-%Pred-Post: 62 %
FEV6-%Pred-Pre: 64 %
FEV6-Post: 1.36 L
FEV6-Pre: 1.41 L
FEV6FVC-%Pred-Post: 106 %
FEV6FVC-%Pred-Pre: 106 %
FVC-%Change-Post: -3 %
FVC-%Pred-Post: 58 %
FVC-%Pred-Pre: 60 %
FVC-Post: 1.36 L
FVC-Pre: 1.41 L
Post FEV1/FVC ratio: 92 %
Post FEV6/FVC ratio: 100 %
Pre FEV1/FVC ratio: 89 %
Pre FEV6/FVC Ratio: 100 %

## 2021-12-20 MED ORDER — IPRATROPIUM-ALBUTEROL 0.5-2.5 (3) MG/3ML IN SOLN: 3.0000 mL | Freq: Three times a day (TID) | RESPIRATORY_TRACT | 3 refills | Status: AC

## 2021-12-20 NOTE — Patient Instructions (Signed)
Full PFT performed today.

## 2021-12-20 NOTE — Patient Instructions (Signed)
Chronic hypoxemic respiratory failure --ORDER CT Chest without contrast (end of Feb) --START using Duonebs TWICE a day --CONTINUE supplemental oxygen. Wear 3L with activity and sleep.  Follow-up with me after CT scan

## 2021-12-20 NOTE — Progress Notes (Signed)
Full PFT performed today. °

## 2021-12-20 NOTE — Progress Notes (Signed)
Subjective:   PATIENT ID: Margaret Dyer GENDER: female DOB: 06/11/1935, MRN: [id number]   HPI  Chief Complaint  Patient presents with   Follow-up    PFT follow-up   Reason for Visit: Follow-up  Margaret Dyer is a 87 year old female with with negligible smoking history with asthma, DM2, HTN, pulmonary hypertension, chronic diastolic heart failure, cirrhosis secondary to NASH, RCC s/p left nephrectomy, stage IIIb CKD who presents for follow-up.  She is mainly non-ambulatory and uses a walker or wheelchair at home. After her hospitalization for acute diastolic heart failure in June 2022,  she was discharged on increased oxygen three months ago. She is currently on 3L O2. Before this she was on 2L oxygen 6 months prior. She has had multiple ED visits for various medical issues including UTI and heart failure. She is followed by Cardiology with Dr. Debara Pickett who has recently adjusted her diuretics on 07/20/21 for lower extremity edema.   12/20/21 Since our last visit, she continues to have shortness of breath at baseline. Back pain worsens her shortness of breath. Denies cough or wheezing. No lower extremity edema or chest pain. Has been compliant with her oxygen. She was unable to complete sleep study due to restlessness. Completed PFTs for dyspnea work-up and awaiting to discuss results.  Social History: Smoke 1/4 ppd x 4 years. Quit >40 years ago   Past Medical History:  Diagnosis Date   Asthma    Blood transfusion    Chronic diastolic CHF (congestive heart failure) (HCC)    CKD (chronic kidney disease), stage III (HCC)    Coronary artery calcification seen on CT scan    Diabetes mellitus    Erythropoietin deficiency anemia 09/19/2021   Goiter    Hyperlipidemia    Hypertension    Hypothyroidism    Iron deficiency anemia due to chronic blood loss 09/19/2021   Liver cirrhosis (HCC)    NASH (nonalcoholic steatohepatitis)    Obesity    Osteopenia    Pulmonary hypertension (HCC)     Renal cell carcinoma    a. prior nephrectomy.   Right heart failure (HCC)    Thrombocytopenia ([place])      Family History  Problem Relation Age of Onset   Cancer Sister        bladder   Kidney cancer Father    Cancer Father        renal   COPD Father    Congestive Heart Failure Father    Coronary artery disease Other    Diabetes Other    Hyperlipidemia Other    Hypertension Other    Rheumatologic disease Neg Hx      Social History   Occupational History    Comment: retired  Tobacco Use   Smoking status: Former    Packs/day: 0.25    Types: Cigarettes    Quit date: 12/04/1973    Years since quitting: 48.0   Smokeless tobacco: Never   Tobacco comments:    05/30/18 none in 40 years  Vaping Use   Vaping Use: Never used  Substance and Sexual Activity   Alcohol use: No    Alcohol/week: 0.0 standard drinks   Drug use: No   Sexual activity: Not on file    Allergies  Allergen Reactions   Other Other (See Comments)    Patient has hemorrhaged at least 4 times in her lifetime   Bactrim [Sulfamethoxazole-Trimethoprim]    Cefuroxime Axetil Nausea Only    Upset stomach  Ciprofloxacin Nausea Only    Upset stomach     Outpatient Medications Prior to Visit  Medication Sig Dispense Refill   ACCU-CHEK GUIDE test strip CHECK BLOOD GLUCOSE FOUR TIMES DAILY     Accu-Chek Softclix Lancets lancets Check BG 4 times/day.  Dx E11.65     albuterol (PROVENTIL) (2.5 MG/3ML) 0.083% nebulizer solution Take 3 mLs (2.5 mg total) by nebulization every 6 (six) hours as needed for wheezing or shortness of breath. 75 mL 12   amoxicillin (AMOXIL) 875 MG tablet Take 1 tablet (875 mg total) by mouth 2 (two) times daily. 14 tablet 0   aspirin EC 81 MG EC tablet Take 1 tablet (81 mg total) by mouth daily.     docusate sodium (COLACE) 100 MG capsule Take 1 capsule (100 mg total) by mouth 2 (two) times daily. 10 capsule 0   Ezetimibe-Simvastatin (VYTORIN PO) Take 1 tablet by mouth daily.      ezetimibe-simvastatin (VYTORIN) 10-20 MG tablet Take 1 tablet by mouth daily. 90 tablet 1   hydrALAZINE (APRESOLINE) 50 MG tablet Take 2 tablets each morning (100 mg), then 1 tablet (50 mg) mid-day and evening 360 tablet 3   insulin glargine (LANTUS) 100 UNIT/ML injection Inject 0.25 mLs (25 Units total) into the skin at bedtime.     Insulin Glulisine (APIDRA SOLOSTAR) 100 UNIT/ML Solostar Pen Inject 10-15 Units into the skin 3 (three) times daily before meals.      ipratropium-albuterol (DUONEB) 0.5-2.5 (3) MG/3ML SOLN Take 3 mLs by nebulization 3 (three) times daily. 360 mL 3   levothyroxine (SYNTHROID, LEVOTHROID) 25 MCG tablet take 1 tablet by mouth daily (Patient taking differently: Take 25 mcg by mouth. Daily except none on Saturdays and Sundays) 30 tablet 5   LORazepam (ATIVAN) 0.5 MG tablet Take 1 tablet (0.5 mg total) by mouth daily as needed for anxiety. 30 tablet 1   metoprolol tartrate (LOPRESSOR) 25 MG tablet TAKE 1 TABLET BY MOUTH TWICE DAILY 180 tablet 3   Multiple Vitamins-Minerals (CENTRUM SILVER ULTRA WOMENS PO) Take 1 tablet by mouth at bedtime.      nystatin cream (MYCOSTATIN) Apply 1 application topically 2 (two) times daily. 30 g 3   ofloxacin (FLOXIN OTIC) 0.3 % OTIC solution Place 10 drops into the right ear daily. 10 mL 0   oxybutynin (DITROPAN) 5 MG tablet Take 1 tablet (5 mg total) by mouth daily. 90 tablet 1   polyethylene glycol (MIRALAX / GLYCOLAX) 17 g packet Take 17 g by mouth daily as needed.     potassium chloride SA (KLOR-CON) 20 MEQ tablet TAKE 1 TABLET BY MOUTH DAILY AT BEDTIME 30 tablet 11   sucralfate (CARAFATE) 1 g tablet Take 1 tablet (1 g total) by mouth 4 (four) times daily -  with meals and at bedtime. 90 tablet 0   torsemide (DEMADEX) 20 MG tablet Take 2 tablets (63m) by mouth in the morning and 1 tablet (241m in the afternoon. 225 tablet 3   traMADol (ULTRAM) 50 MG tablet Take 1 tablet (50 mg total) by mouth every 6 (six) hours as needed for moderate  pain. 60 tablet 1   No facility-administered medications prior to visit.    Review of Systems  Constitutional:  Negative for chills, diaphoresis, fever, malaise/fatigue and weight loss.  HENT:  Negative for congestion.   Respiratory:  Positive for shortness of breath. Negative for cough, hemoptysis, sputum production and wheezing.   Cardiovascular:  Negative for chest pain, palpitations and leg swelling.  Objective:   Vitals:   12/20/21 1624  BP: (!) 150/70  Pulse: 71  SpO2: 95%  Weight: 198 lb (89.8 kg)  Height: _0  (1.6 m)      Physical Exam: General: Elderly, chronically ill-appearing, no acute distress HENT: [place], AT Eyes: EOMI, no scleral icterus Respiratory: Diminished breath sounds bilaterally.  No crackles, wheezing or rales Cardiovascular: RRR, -M/R/G, no JVD Extremities:-Edema,-tenderness Neuro: AAO x4, CNII-XII grossly intact Psych: Normal mood, normal affect  Data Reviewed:  Imaging: CXR 07/04/21 - Cardiomegaly. Increased interstitial lung markings  PFT: 12/20/21 FVC 1.36 (58%) FEV1 1.25 (73%) Ratio 89  TLC 55% DLCO 64% Interpretation: Mild restrictive defect with mildly reduced DLCO. No significant BD response.  Echo: 05/26/2021 - EF 60-65%, grade II DD. No valvular or WMA  Labs: CBC CBC Latest Ref Rng & Units 12/24/2021 12/13/2021 10/25/2021  WBC 4.0 - 10.5 K/uL 7.1 6.1 6.7  Hemoglobin 12.0 - 15.0 g/dL 12.1 11.9(L) 11.4(L)  Hematocrit 36.0 - 46.0 % 37.0 36.7 35.5(L)  Platelets 150 - 400 K/uL 99(L) 106.0(L) 103(L)    BMET BMP Latest Ref Rng & Units 12/24/2021 12/13/2021 10/25/2021  Glucose 70 - 99 mg/dL 190(H) 209(H) 307(H)  BUN 8 - 23 mg/dL 42(H) 50(H) 54(H)  Creatinine 0.44 - 1.00 mg/dL 1.45(H) 1.44(H) 1.39(H)  BUN/Creat Ratio 12 - 28 - - -  Sodium 135 - 145 mmol/L 140 139 138  Potassium 3.5 - 5.1 mmol/L 3.9 3.9 4.6  Chloride 98 - 111 mmol/L 98 94(L) 96(L)  CO2 22 - 32 mmol/L 32 37(H) 35(H)  Calcium 8.9 - 10.3 mg/dL 9.0 9.3 9.9  Anemia -  improving Thrombocytopenia.  CKD     Assessment & Plan:   Discussion: 87 year old female with multiple co-morbidities including chronic hypoxemic respiratory failure secondary to unknown etiology. We reviewed PFTs which demonstrated restrictive defect with mildly reduced DLCO. Will further evaluate with CT  Shortness of breath Restrictive defect on PFTs Chronic hypoxemic respiratory failure, unclear etiology but suspect  multifactorial with suspected secondary pulmonary hypertension. Hx of asthma but would not expect hypoxemia from this. Will need to rule out OSA. CT Chest with prior scarring. Heart failure and deconditioning likely contributing to her symptoms. --ORDER CT Chest without contrast --START using Duonebs TWICE a day --CONTINUE supplemental oxygen. Wear 3L with activity and sleep. --Unable to complete split night study due to restlessness  Anemia, improving Thrombocytopenia, observation with Hematology --Last colonoscopy 5-10 years ago with small polyp removal and normal per patient  --Keep follow-up with your Hematologist, Dr. Marin Olp    Health Maintenance Immunization History  Administered Date(s) Administered   Influenza Split 09/29/2011   Influenza Whole 09/04/1999, 09/20/2007, 09/04/2008, 01/18/2010, 09/19/2010   Influenza, High Dose Seasonal PF 01/05/2014, 09/23/2015, 10/24/2016, 11/09/2017   Influenza, Seasonal, Injecte, Preservative Fre 12/05/2012   Influenza,inj,Quad PF,6+ Mos 10/27/2014, 09/19/2021   Influenza-Unspecified 10/04/2018   Moderna Sars-Covid-2 Vaccination 02/12/2020, 03/22/2020, 11/06/2020   Pneumococcal Conjugate-13 05/28/2017   Tdap 10/24/2016   CT Lung Screen - not qualified. Negligible smoking history  Orders Placed This Encounter  Procedures   CT Chest Wo Contrast    Standing Status:   Future    Standing Expiration Date:   12/20/2022    Scheduling Instructions:     CT Chest without contrast (end of Feb)     Patient will also need to  [address] [id number]    Order Specific Question:   Preferred imaging location?    Answer:   [facility]  Meds ordered this encounter  Medications   ipratropium-albuterol (DUONEB) 0.5-2.5 (3) MG/3ML SOLN    Sig: Take 3 mLs by nebulization 3 (three) times daily.    Dispense:  360 mL    Refill:  3    Return in about 7 weeks (around 02/10/2022).  I have spent a total time of 32-minutes on the day of the appointment reviewing prior documentation, coordinating care and discussing medical diagnosis and plan with the patient/family. Past medical history, allergies, medications were reviewed. Pertinent imaging, labs and tests included in this note have been reviewed and interpreted independently by me.  [place], MD [facility] 12/20/2021 4:19 PM  Office Number [phone]

## 2021-12-22 ENCOUNTER — Telehealth: Payer: Medicare Other | Admitting: Pharmacist

## 2021-12-22 NOTE — Telephone Encounter (Signed)
Pt called, informed her of results and recommendations. She requested I mail results to her.

## 2021-12-24 ENCOUNTER — Emergency Department (HOSPITAL_COMMUNITY)
Admission: EM | Admit: 2021-12-24 | Discharge: 2021-12-24 | Disposition: A | Discharge status: Home / Self Care | Payer: Medicare Other | Attending: Emergency Medicine | Admitting: Emergency Medicine

## 2021-12-24 ENCOUNTER — Encounter (HOSPITAL_COMMUNITY): Payer: Self-pay

## 2021-12-24 ENCOUNTER — Other Ambulatory Visit: Payer: Self-pay

## 2021-12-24 DIAGNOSIS — M2550 Pain in unspecified joint: Secondary | ICD-10-CM | POA: Diagnosis not present

## 2021-12-24 DIAGNOSIS — I509 Heart failure, unspecified: Secondary | ICD-10-CM | POA: Diagnosis not present

## 2021-12-24 DIAGNOSIS — I110 Hypertensive heart disease with heart failure: Secondary | ICD-10-CM | POA: Diagnosis not present

## 2021-12-24 DIAGNOSIS — Z794 Long term (current) use of insulin: Secondary | ICD-10-CM | POA: Insufficient documentation

## 2021-12-24 DIAGNOSIS — R519 Headache, unspecified: Secondary | ICD-10-CM | POA: Diagnosis present

## 2021-12-24 DIAGNOSIS — Z7982 Long term (current) use of aspirin: Secondary | ICD-10-CM | POA: Insufficient documentation

## 2021-12-24 DIAGNOSIS — Z79899 Other long term (current) drug therapy: Secondary | ICD-10-CM | POA: Diagnosis not present

## 2021-12-24 DIAGNOSIS — G4489 Other headache syndrome: Secondary | ICD-10-CM | POA: Diagnosis not present

## 2021-12-24 DIAGNOSIS — I10 Essential (primary) hypertension: Secondary | ICD-10-CM | POA: Diagnosis not present

## 2021-12-24 DIAGNOSIS — G44209 Tension-type headache, unspecified, not intractable: Secondary | ICD-10-CM | POA: Diagnosis not present

## 2021-12-24 DIAGNOSIS — Z7401 Bed confinement status: Secondary | ICD-10-CM | POA: Diagnosis not present

## 2021-12-24 DIAGNOSIS — F32A Depression, unspecified: Secondary | ICD-10-CM | POA: Diagnosis not present

## 2021-12-24 DIAGNOSIS — R110 Nausea: Secondary | ICD-10-CM | POA: Diagnosis not present

## 2021-12-24 DIAGNOSIS — R42 Dizziness and giddiness: Secondary | ICD-10-CM | POA: Diagnosis not present

## 2021-12-24 DIAGNOSIS — R457 State of emotional shock and stress, unspecified: Secondary | ICD-10-CM | POA: Diagnosis not present

## 2021-12-24 DIAGNOSIS — Z743 Need for continuous supervision: Secondary | ICD-10-CM | POA: Diagnosis not present

## 2021-12-24 LAB — COMPREHENSIVE METABOLIC PANEL
ALT: 19 U/L (ref 0–44)
AST: 27 U/L (ref 15–41)
Albumin: 3.8 g/dL (ref 3.5–5.0)
Alkaline Phosphatase: 51 U/L (ref 38–126)
Anion gap: 10 (ref 5–15)
BUN: 42 mg/dL — ABNORMAL HIGH (ref 8–23)
CO2: 32 mmol/L (ref 22–32)
Calcium: 9 mg/dL (ref 8.9–10.3)
Chloride: 98 mmol/L (ref 98–111)
Creatinine, Ser: 1.45 mg/dL — ABNORMAL HIGH (ref 0.44–1.00)
GFR, Estimated: 36 mL/min — ABNORMAL LOW (ref >60)
Glucose, Bld: 190 mg/dL — ABNORMAL HIGH (ref 70–99)
Potassium: 3.9 mmol/L (ref 3.5–5.1)
Sodium: 140 mmol/L (ref 135–145)
Total Bilirubin: 0.8 mg/dL (ref 0.3–1.2)
Total Protein: 7 g/dL (ref 6.5–8.1)

## 2021-12-24 LAB — CBC WITH DIFFERENTIAL/PLATELET
Abs Immature Granulocytes: 0.05 10*3/uL (ref 0.00–0.07)
Basophils Absolute: 0 10*3/uL (ref 0.0–0.1)
Basophils Relative: 1 %
Eosinophils Absolute: 0.1 10*3/uL (ref 0.0–0.5)
Eosinophils Relative: 2 %
HCT: 37 % (ref 36.0–46.0)
Hemoglobin: 12.1 g/dL (ref 12.0–15.0)
Immature Granulocytes: 1 %
Lymphocytes Relative: 10 %
Lymphs Abs: 0.7 10*3/uL (ref 0.7–4.0)
MCH: 30 pg (ref 26.0–34.0)
MCHC: 32.7 g/dL (ref 30.0–36.0)
MCV: 91.8 fL (ref 80.0–100.0)
Monocytes Absolute: 0.7 10*3/uL (ref 0.1–1.0)
Monocytes Relative: 9 %
Neutro Abs: 5.5 10*3/uL (ref 1.7–7.7)
Neutrophils Relative %: 77 %
Platelets: 99 10*3/uL — ABNORMAL LOW (ref 150–400)
RBC: 4.03 MIL/uL (ref 3.87–5.11)
RDW: 13.8 % (ref 11.5–15.5)
WBC: 7.1 10*3/uL (ref 4.0–10.5)
nRBC: 0 % (ref 0.0–0.2)

## 2021-12-24 MED ORDER — METOPROLOL TARTRATE 25 MG PO TABS
25.0000 mg | ORAL_TABLET | Freq: Once | ORAL | Status: DC
  Filled 2021-12-24: qty 1

## 2021-12-24 MED ORDER — ACETAMINOPHEN 325 MG PO TABS
650.0000 mg | ORAL_TABLET | Freq: Once | ORAL | Status: AC
  Administered 2021-12-24: 650 mg via ORAL
  Filled 2021-12-24: qty 2

## 2021-12-24 MED ORDER — DIPHENHYDRAMINE HCL 50 MG/ML IJ SOLN
25.0000 mg | Freq: Once | INTRAMUSCULAR | Status: AC
  Administered 2021-12-24: 25 mg via INTRAVENOUS
  Filled 2021-12-24: qty 1

## 2021-12-24 MED ORDER — PROCHLORPERAZINE EDISYLATE 10 MG/2ML IJ SOLN
10.0000 mg | Freq: Once | INTRAMUSCULAR | Status: AC
  Administered 2021-12-24: 10 mg via INTRAVENOUS
  Filled 2021-12-24: qty 2

## 2021-12-24 MED ORDER — DROPERIDOL 2.5 MG/ML IJ SOLN
2.5000 mg | Freq: Once | INTRAMUSCULAR | Status: DC
Start: 2021-12-24 — End: 2021-12-24

## 2021-12-24 MED ORDER — HYDRALAZINE HCL 50 MG PO TABS
50.0000 mg | ORAL_TABLET | Freq: Once | ORAL | Status: AC
  Administered 2021-12-24: 50 mg via ORAL
  Filled 2021-12-24: qty 1

## 2021-12-24 MED ORDER — KETOROLAC TROMETHAMINE 15 MG/ML IJ SOLN
15.0000 mg | Freq: Once | INTRAMUSCULAR | Status: AC
  Administered 2021-12-24: 15 mg via INTRAVENOUS
  Filled 2021-12-24: qty 1

## 2021-12-24 MED ORDER — SODIUM CHLORIDE 0.9 % IV BOLUS
500.0000 mL | Freq: Once | INTRAVENOUS | Status: AC
  Administered 2021-12-24: 500 mL via INTRAVENOUS

## 2021-12-24 NOTE — ED Provider Notes (Signed)
[facility] EMERGENCY DEPARTMENT Provider Note   CSN: [id number] Arrival date & time: 12/24/21  1053     History  No chief complaint on file.   Margaret Dyer is a 87 y.o. female This is a patient with a past medical history significant for heart failure, requiring oxygen, chronic hypertension, shortness of breath who presents with 1 day of headache, feeling of lightheadedness without blurry vision, chest pain, shortness of breath.  Patient said that her blood pressure was elevated to systolic of 701.  Improved to 165 after administration of home medications.  Patient reports that she has been struggling with this, had a referral from her cardiologist to a blood pressure specialist who she supposed to see on February 8.  Patient reports that she is also diabetic, has been taking her long-acting Lantus, but has not been regularly always taking her with meals insulin secondary to fluctuating sugar levels, reports that she often does not eat until 1-2 PM.   HPI     Home Medications Prior to Admission medications   Medication Sig Start Date End Date Taking? Authorizing Provider  ACCU-CHEK GUIDE test strip CHECK BLOOD GLUCOSE FOUR TIMES DAILY 08/05/21   [provider]  Accu-Chek Softclix Lancets lancets Check BG 4 times/day.  Dx E11.65 08/03/21   [provider]  albuterol (PROVENTIL) (2.5 MG/3ML) 0.083% nebulizer solution Take 3 mLs (2.5 mg total) by nebulization every 6 (six) hours as needed for wheezing or shortness of breath. 06/09/21   Ann Held, DO  amoxicillin (AMOXIL) 875 MG tablet Take 1 tablet (875 mg total) by mouth 2 (two) times daily. 12/09/21   Ann Held, DO  aspirin EC 81 MG EC tablet Take 1 tablet (81 mg total) by mouth daily. 07/07/19   Geradine Girt, DO  docusate sodium (COLACE) 100 MG capsule Take 1 capsule (100 mg total) by mouth 2 (two) times daily. 12/30/18   Bonnell Public, MD  Ezetimibe-Simvastatin (VYTORIN PO)  Take 1 tablet by mouth daily.    [provider]  ezetimibe-simvastatin (VYTORIN) 10-20 MG tablet Take 1 tablet by mouth daily. 12/13/21   Ann Held, DO  hydrALAZINE (APRESOLINE) 50 MG tablet Take 2 tablets each morning (100 mg), then 1 tablet (50 mg) mid-day and evening 11/25/21   Hilty, Nadean Corwin, MD  insulin glargine (LANTUS) 100 UNIT/ML injection Inject 0.25 mLs (25 Units total) into the skin at bedtime. 07/06/19   Geradine Girt, DO  Insulin Glulisine (APIDRA SOLOSTAR) 100 UNIT/ML Solostar Pen Inject 10-15 Units into the skin 3 (three) times daily before meals.     [provider]  ipratropium-albuterol (DUONEB) 0.5-2.5 (3) MG/3ML SOLN Take 3 mLs by nebulization 3 (three) times daily. 12/20/21   Margaretha Seeds, MD  levothyroxine (SYNTHROID, LEVOTHROID) 25 MCG tablet take 1 tablet by mouth daily Patient taking differently: Take 25 mcg by mouth. Daily except none on Saturdays and Sundays 07/13/17   Carollee Herter, Kendrick Fries R, DO  LORazepam (ATIVAN) 0.5 MG tablet Take 1 tablet (0.5 mg total) by mouth daily as needed for anxiety. 12/13/21   Roma Schanz R, DO  metoprolol tartrate (LOPRESSOR) 25 MG tablet TAKE 1 TABLET BY MOUTH TWICE DAILY 11/30/21   Hilty, Nadean Corwin, MD  Multiple Vitamins-Minerals (CENTRUM SILVER ULTRA WOMENS PO) Take 1 tablet by mouth at bedtime.     [provider]  nystatin cream (MYCOSTATIN) Apply 1 application topically 2 (two) times daily. 12/13/21  Carollee Herter, Yvonne R, DO  ofloxacin (FLOXIN OTIC) 0.3 % OTIC solution Place 10 drops into the right ear daily. 12/13/21   Ann Held, DO  oxybutynin (DITROPAN) 5 MG tablet Take 1 tablet (5 mg total) by mouth daily. 10/13/21   Ann Held, DO  polyethylene glycol (MIRALAX / GLYCOLAX) 17 g packet Take 17 g by mouth daily as needed.    [provider]  potassium chloride SA (KLOR-CON) 20 MEQ tablet TAKE 1 TABLET BY MOUTH DAILY AT BEDTIME 02/24/21   Hilty, Nadean Corwin, MD   sucralfate (CARAFATE) 1 g tablet Take 1 tablet (1 g total) by mouth 4 (four) times daily -  with meals and at bedtime. 12/02/19   Ann Held, DO  torsemide (DEMADEX) 20 MG tablet Take 2 tablets (40m) by mouth in the morning and 1 tablet (28m in the afternoon. 10/31/21   Hilty, KeNadean CorwinMD  traMADol (ULTRAM) 50 MG tablet Take 1 tablet (50 mg total) by mouth every 6 (six) hours as needed for moderate pain. 06/09/21   LoAnn HeldDO      Allergies    Other, Bactrim [sulfamethoxazole-trimethoprim], Cefuroxime axetil, and Ciprofloxacin    Review of Systems   Review of Systems  Neurological:  Positive for headaches.  All other systems reviewed and are negative.  Physical Exam Updated Vital Signs BP (!) 164/59    Pulse (!) 58    Temp 98.5 F (36.9 C) (Oral)    Resp 13    Ht _0  (1.6 m)    Wt 89.8 kg    LMP  (LMP Unknown)    SpO2 99%    BMI 35.07 kg/m  Physical Exam Vitals and nursing note reviewed.  Constitutional:      General: She is not in acute distress.    Appearance: Normal appearance.  HENT:     Head: Normocephalic and atraumatic.  Eyes:     General:        Right eye: No discharge.        Left eye: No discharge.  Cardiovascular:     Rate and Rhythm: Regular rhythm. Bradycardia present.     Heart sounds: No murmur heard.   No friction rub. No gallop.  Pulmonary:     Effort: Pulmonary effort is normal.     Breath sounds: Normal breath sounds.  Abdominal:     General: Bowel sounds are normal.     Palpations: Abdomen is soft.  Skin:    General: Skin is warm and dry.     Capillary Refill: Capillary refill takes less than 2 seconds.     Comments: Patient has a fair amount of bruising on arms.  No active bleeding, no ulcerations.  Neurological:     Mental Status: She is alert and oriented to person, place, and time.     Comments: CN II through XII grossly intact.  Romberg negative, gait normal.  Intact finger-nose, intact heel-to-shin.  No pronator  drift.  Alert and oriented x4.  Psychiatric:        Mood and Affect: Mood normal.        Behavior: Behavior normal.    ED Results / Procedures / Treatments   Labs (all labs ordered are listed, but only abnormal results are displayed) Labs Reviewed  CBC WITH DIFFERENTIAL/PLATELET - Abnormal; Notable for the following components:      Result Value   Platelets 99 (*)    All other components  within normal limits  COMPREHENSIVE METABOLIC PANEL - Abnormal; Notable for the following components:   Glucose, Bld 190 (*)    BUN 42 (*)    Creatinine, Ser 1.45 (*)    GFR, Estimated 36 (*)    All other components within normal limits    EKG EKG Interpretation  Date/Time:  Saturday December 24 2021 10:59:15 EST Ventricular Rate:  64 PR Interval:  196 QRS Duration: 126 QT Interval:  452 QTC Calculation: 467 R Axis:   92 Text Interpretation: Sinus rhythm Atrial premature complex Nonspecific intraventricular conduction delay Borderline T abnormalities, inferior leads Confirmed by Davonna Belling [phone]) on 12/24/2021 1:20:42 PM  Radiology No results found.  Procedures Procedures    Medications Ordered in ED Medications  acetaminophen (TYLENOL) tablet 650 mg (650 mg Oral Given 12/24/21 1146)  sodium chloride 0.9 % bolus 500 mL (0 mLs Intravenous Stopped 12/24/21 1355)  ketorolac (TORADOL) 15 MG/ML injection 15 mg (15 mg Intravenous Given 12/24/21 1303)  diphenhydrAMINE (BENADRYL) injection 25 mg (25 mg Intravenous Given 12/24/21 1304)  prochlorperazine (COMPAZINE) injection 10 mg (10 mg Intravenous Given 12/24/21 1403)    ED Course/ Medical Decision Making/ A&P                            Medical Decision Making Amount and/or Complexity of Data Reviewed Labs: ordered.  Risk OTC drugs. Prescription drug management.   I discussed this case with my attending physician who cosigned this note including patient's presenting symptoms, physical exam, and planned diagnostics and  interventions. Attending physician stated agreement with plan or made changes to plan which were implemented.   This is a patient with past medical history significant for hypertension, diabetes, heart failure, CKD, with notable history of hypertension that is refractory to medication who has upcoming appointment with hypertension specialist Dr. Who presents with continued concern for overall poor feeling, headache in context of elevated blood pressure.  Patient took her home blood pressure medications and feel some relief of symptoms but still has ongoing headache.  My differential diagnosis includes stroke, hemorrhage, uncontrolled hypertension, anemia, electrolyte abnormality, migraine, dural venous thrombosis versus other.  Patient with no focal neurologic findings, is overall well-appearing on my exam.  She is hypertensive on arrival, however blood pressure is improving throughout her stay, most recent blood pressure at 154/55, after medications have been administered.  She does have a mild thrombocytopenia that is stable compared to previous.  Explain some of her bruising.  I independently ordered and reviewed lab work which is significant for moderate hyperglycemia at 190, elevated kidney function at 1.45, elevated BUN at 42.  This is stable compared to previous.  CBC unremarkable other than platelets as noted above.  Obtained an EKG which was independently reviewed by my attending Dr. Alvino Chapel, shows no evidence of ischemia, she has borderline prolonged QTC.  Additionally with 1 premature beat, as well as borderline T wave abnormalities.  At this time I have minimal clinical concern for ACS, acute stroke, or other acute intracranial abnormality as patient has no focal neurologic deficits, her development of headache was gradual, not sudden, and patient cannot describe a "last known well".  Additionally patient has no chest pain, shortness of breath with her presentation.  Discussed with her I  recommend that she follow-up with her cardiologist, and blood pressure doctor, however she is continuing to express worry that her blood pressure is going to remain elevated or return to elevation if  we discharge her, we will treat her headache symptoms and continue to monitor blood pressure at this time.  On reassessment patient reports headache has improved, continues to have focal neurodeficits.  She appears stable for discharge at this time.  Patient is comfortable with this plan, will follow up with her cardiologist, antihypertensive doctor at her earliest convenience.  Extensive return precautions given. Final Clinical Impression(s) / ED Diagnoses Final diagnoses:  Hypertension, unspecified type  Acute non intractable tension-type headache    Rx / DC Orders ED Discharge Orders     None         Dorien Chihuahua 12/24/21 1449    Davonna Belling, MD 12/24/21 1943

## 2021-12-24 NOTE — ED Triage Notes (Signed)
Pt bib [facility] from home with complaints of shob and feeling lightheaded. Pt says this happens when her blood pressure is too high. Upon EMS arrival pt took her regular home medications and says that she now feels better. Pt states she has not missed any medications but does not take them at the correct time. Pt arrives with complaints of knee pain which is her normal. AOx4. EMS vitals: 192/86 then 160/76 after medications, 70HR, 97% 3LNC

## 2021-12-24 NOTE — ED Notes (Signed)
Patient calling out multiple time requesting HTN meds. Provider made aware.

## 2021-12-24 NOTE — Discharge Instructions (Addendum)
Continue take your at home blood pressure medications, follow-up with your PCP, cardiologist, and new specialist for hypertensive management.  Please return to the emergency department if you have a headache that does not respond to your blood pressure medications, Tylenol, ibuprofen, fluids.  Please return to the emergency department if you begin to have chest pain, shortness of breath.  It was a pleasure taking care of you today I hope that you feel better soon.

## 2021-12-26 ENCOUNTER — Encounter: Payer: Self-pay | Admitting: Pulmonary Disease

## 2021-12-26 ENCOUNTER — Telehealth: Payer: Self-pay

## 2021-12-26 NOTE — Telephone Encounter (Signed)
Pt called to advise she went to the ER on Saturday for BP issues and a headache.  She will plan to go to her scheduled cardiology appt with Dr. Oval Linsey next month and just wanted Dr. Etter Sjogren to be aware of her ER visit over the weekend.

## 2021-12-27 NOTE — Telephone Encounter (Signed)
Pt called. LVM advising patient to schedule a nurse visit to check blood pressure.

## 2022-01-06 ENCOUNTER — Telehealth: Payer: Self-pay | Admitting: Pharmacist

## 2022-01-06 ENCOUNTER — Telehealth: Payer: Medicare Other

## 2022-01-06 NOTE — Telephone Encounter (Signed)
Patient called for Chronic Care Management follow up appointment. She was not able to complete phone visit today but ask for call back on Monday 01/09/2022. Rescheduled appt.

## 2022-01-10 ENCOUNTER — Telehealth: Payer: Self-pay | Admitting: Pharmacist

## 2022-01-10 ENCOUNTER — Telehealth: Payer: Medicare Other

## 2022-01-10 NOTE — Telephone Encounter (Signed)
Care Management   Follow Up Note   01/10/2022 Name: Margaret Dyer MRN: [id number] DOB: 1935-01-19   Referred by: Ann Held, DO Reason for referral : Appointment (Chronic Care Management follow up)   A second unsuccessful telephone outreach was attempted today. The patient was referred to the case management team for assistance with care management and care coordination.   Follow Up Plan: The care management team will reach out to the patient again over the next 30 days.   Cherre Robins, PharmD Clinical Pharmacist [facility]

## 2022-01-11 ENCOUNTER — Encounter (HOSPITAL_BASED_OUTPATIENT_CLINIC_OR_DEPARTMENT_OTHER): Payer: Self-pay | Admitting: Cardiovascular Disease

## 2022-01-11 ENCOUNTER — Ambulatory Visit (INDEPENDENT_AMBULATORY_CARE_PROVIDER_SITE_OTHER): Payer: Medicare Other

## 2022-01-11 ENCOUNTER — Other Ambulatory Visit: Payer: Self-pay

## 2022-01-11 ENCOUNTER — Ambulatory Visit (INDEPENDENT_AMBULATORY_CARE_PROVIDER_SITE_OTHER): Payer: Medicare Other | Admitting: Cardiovascular Disease

## 2022-01-11 VITALS — BP 154/58 | HR 69 | Ht 63.0 in | Wt 196.6 lb

## 2022-01-11 DIAGNOSIS — I10 Essential (primary) hypertension: Secondary | ICD-10-CM

## 2022-01-11 DIAGNOSIS — I6523 Occlusion and stenosis of bilateral carotid arteries: Secondary | ICD-10-CM

## 2022-01-11 DIAGNOSIS — I5032 Chronic diastolic (congestive) heart failure: Secondary | ICD-10-CM

## 2022-01-11 DIAGNOSIS — E785 Hyperlipidemia, unspecified: Secondary | ICD-10-CM | POA: Diagnosis not present

## 2022-01-11 DIAGNOSIS — G4733 Obstructive sleep apnea (adult) (pediatric): Secondary | ICD-10-CM | POA: Diagnosis not present

## 2022-01-11 MED ORDER — HYDRALAZINE HCL 100 MG PO TABS: 100.0000 mg | ORAL_TABLET | Freq: Three times a day (TID) | ORAL | 5 refills | Status: DC

## 2022-01-11 NOTE — Assessment & Plan Note (Signed)
Lipids are poorly controlled.  She was off her simvastatin/Ezetimibe when checked recently.  She has resumed her medication.

## 2022-01-11 NOTE — Assessment & Plan Note (Signed)
Blood pressure is poorly controlled.  She did not tolerate amlodipine.  Lisinopril was stopped due to cough.  She really likes hydralazine.  She will increase it to 100 mg 3 times a day. She also has asymmetric blood pressures.  She has known carotid stenosis.  We will get repeat carotid Dopplers today which will evaluate her carotids and also see if there is any evidence of any subclavian stenosis.  She is going to work on trying to increase her exercise gradually.  She understands that the goal is at least 150 minutes weekly, but for now she will start working on walking at least 5 minutes daily.  She already limits her sodium intake.  Thyroid function is within normal limits.  Ideally should be on an ARB, especially given her diabetes and chronic kidney disease.  It is unclear why her losartan was previously stopped.  She is very hesitant about making medication changes.  For now we will just increase the hydralazine and consider adding low-dose valsartan in the future.  She was given an advance hypertension clinic booklet and will track her blood pressures and bring to follow-up.

## 2022-01-11 NOTE — Assessment & Plan Note (Signed)
Continue supplemental oxygen.  Sleep study was not diagnostic.

## 2022-01-11 NOTE — Assessment & Plan Note (Addendum)
She is euvolemic on exam.  Blood pressure is poorly controlled.  Will increase hydralazine to 50 mg 3 times a day.    Continue torsemide.

## 2022-01-11 NOTE — Patient Instructions (Addendum)
Medication Instructions:  INCREASE YOUR HYDRALAZINE TO 100 MG THREE TIMES A DAY    Labwork: NONE   Testing/Procedures: Your physician has requested that you have a carotid duplex. This test is an ultrasound of the carotid arteries in your neck. It looks at blood flow through these arteries that supply the brain with blood. Allow one hour for this exam. There are no restrictions or special instructions. TODAY    Follow-Up: 03/09/2022 AT 2:30  MYCHART VISIT WITH YOUR DAUGHTERS PHONE   Special Instructions:   TRY TO EXERCISE 5 MINUTES EACH DAY   MONITOR YOUR BLOOD PRESSURE TWICE A DAY, LOG IN THE BOOK PROVIDED. HAVE READING AVAILABLE AT YOUR VIRTUAL VISIT IN  DASH Eating Plan DASH stands for "Dietary Approaches to Stop Hypertension." The DASH eating plan is a healthy eating plan that has been shown to reduce high blood pressure (hypertension). It may also reduce your risk for type 2 diabetes, heart disease, and stroke. The DASH eating plan may also help with weight loss. What are tips for following this plan?  General guidelines Avoid eating more than 2,300 mg (milligrams) of salt (sodium) a day. If you have hypertension, you may need to reduce your sodium intake to 1,500 mg a day. Limit alcohol intake to no more than 1 drink a day for nonpregnant women and 2 drinks a day for men. One drink equals 12 oz of beer, 5 oz of wine, or 1 oz of hard liquor. Work with your health care provider to maintain a healthy body weight or to lose weight. Ask what an ideal weight is for you. Get at least 30 minutes of exercise that causes your heart to beat faster (aerobic exercise) most days of the week. Activities may include walking, swimming, or biking. Work with your health care provider or diet and nutrition specialist (dietitian) to adjust your eating plan to your individual calorie needs. Reading food labels  Check food labels for the amount of sodium per serving. Choose foods with less than 5  percent of the Daily Value of sodium. Generally, foods with less than 300 mg of sodium per serving fit into this eating plan. To find whole grains, look for the word "whole" as the first word in the ingredient list. Shopping Buy products labeled as "low-sodium" or "no salt added." Buy fresh foods. Avoid canned foods and premade or frozen meals. Cooking Avoid adding salt when cooking. Use salt-free seasonings or herbs instead of table salt or sea salt. Check with your health care provider or pharmacist before using salt substitutes. Do not fry foods. Cook foods using healthy methods such as baking, boiling, grilling, and broiling instead. Cook with heart-healthy oils, such as olive, canola, soybean, or sunflower oil. Meal planning Eat a balanced diet that includes: 5 or more servings of fruits and vegetables each day. At each meal, try to fill half of your plate with fruits and vegetables. Up to 6-8 servings of whole grains each day. Less than 6 oz of lean meat, poultry, or fish each day. A 3-oz serving of meat is about the same size as a deck of cards. One egg equals 1 oz. 2 servings of low-fat dairy each day. A serving of nuts, seeds, or beans 5 times each week. Heart-healthy fats. Healthy fats called Omega-3 fatty acids are found in foods such as flaxseeds and coldwater fish, like sardines, salmon, and mackerel. Limit how much you eat of the following: Canned or prepackaged foods. Food that is high in trans fat,  such as fried foods. Food that is high in saturated fat, such as fatty meat. Sweets, desserts, sugary drinks, and other foods with added sugar. Full-fat dairy products. Do not salt foods before eating. Try to eat at least 2 vegetarian meals each week. Eat more home-cooked food and less restaurant, buffet, and fast food. When eating at a restaurant, ask that your food be prepared with less salt or no salt, if possible. What foods are recommended? The items listed may not be a  complete list. Talk with your dietitian about what dietary choices are best for you. Grains Whole-grain or whole-wheat bread. Whole-grain or whole-wheat pasta. Brown rice. [place]. Bulgur. Whole-grain and low-sodium cereals. Pita bread. Low-fat, low-sodium crackers. Whole-wheat flour tortillas. Vegetables Fresh or frozen vegetables (raw, steamed, roasted, or grilled). Low-sodium or reduced-sodium tomato and vegetable juice. Low-sodium or reduced-sodium tomato sauce and tomato paste. Low-sodium or reduced-sodium canned vegetables. Fruits All fresh, dried, or frozen fruit. Canned fruit in natural juice (without added sugar). Meat and other protein foods Skinless chicken or [place]. Ground chicken or [place]. Pork with fat trimmed off. Fish and seafood. Egg whites. Dried beans, peas, or lentils. Unsalted nuts, nut butters, and seeds. Unsalted canned beans. Lean cuts of beef with fat trimmed off. Low-sodium, lean deli meat. Dairy Low-fat (1%) or fat-free (skim) milk. Fat-free, low-fat, or reduced-fat cheeses. Nonfat, low-sodium ricotta or cottage cheese. Low-fat or nonfat yogurt. Low-fat, low-sodium cheese. Fats and oils Soft margarine without trans fats. Vegetable oil. Low-fat, reduced-fat, or light mayonnaise and salad dressings (reduced-sodium). Canola, safflower, olive, soybean, and sunflower oils. Avocado. Seasoning and other foods Herbs. Spices. Seasoning mixes without salt. Unsalted popcorn and pretzels. Fat-free sweets. What foods are not recommended? The items listed may not be a complete list. Talk with your dietitian about what dietary choices are best for you. Grains Baked goods made with fat, such as croissants, muffins, or some breads. Dry pasta or rice meal packs. Vegetables Creamed or fried vegetables. Vegetables in a cheese sauce. Regular canned vegetables (not low-sodium or reduced-sodium). Regular canned tomato sauce and paste (not low-sodium or reduced-sodium). Regular  tomato and vegetable juice (not low-sodium or reduced-sodium). Angie Fava. Olives. Fruits Canned fruit in a light or heavy syrup. Fried fruit. Fruit in cream or butter sauce. Meat and other protein foods Fatty cuts of meat. Ribs. Fried meat. Berniece Salines. Sausage. Bologna and other processed lunch meats. Salami. Fatback. Hotdogs. Bratwurst. Salted nuts and seeds. Canned beans with added salt. Canned or smoked fish. Whole eggs or egg yolks. Chicken or [place] with skin. Dairy Whole or 2% milk, cream, and half-and-half. Whole or full-fat cream cheese. Whole-fat or sweetened yogurt. Full-fat cheese. Nondairy creamers. Whipped toppings. Processed cheese and cheese spreads. Fats and oils Butter. Stick margarine. Lard. Shortening. Ghee. Bacon fat. Tropical oils, such as coconut, palm kernel, or palm oil. Seasoning and other foods Salted popcorn and pretzels. Onion salt, garlic salt, seasoned salt, table salt, and sea salt. Worcestershire sauce. Tartar sauce. Barbecue sauce. Teriyaki sauce. Soy sauce, including reduced-sodium. Steak sauce. Canned and packaged gravies. Fish sauce. Oyster sauce. Cocktail sauce. Horseradish that you find on the shelf. Ketchup. Mustard. Meat flavorings and tenderizers. Bouillon cubes. Hot sauce and Tabasco sauce. Premade or packaged marinades. Premade or packaged taco seasonings. Relishes. Regular salad dressings. Where to find more information: National Heart, Lung, and [place]: https://wilson-eaton.com/ American Heart Association: www.heart.org Summary The DASH eating plan is a healthy eating plan that has been shown to reduce high blood pressure (hypertension). It may also  reduce your risk for type 2 diabetes, heart disease, and stroke. With the DASH eating plan, you should limit salt (sodium) intake to 2,300 mg a day. If you have hypertension, you may need to reduce your sodium intake to 1,500 mg a day. When on the DASH eating plan, aim to eat more fresh fruits and vegetables, whole  grains, lean proteins, low-fat dairy, and heart-healthy fats. Work with your health care provider or diet and nutrition specialist (dietitian) to adjust your eating plan to your individual calorie needs. This information is not intended to replace advice given to you by your health care provider. Make sure you discuss any questions you have with your health care provider. Document Released: 11/09/2011 Document Revised: 11/02/2017 Document Reviewed: 11/13/2016 Elsevier Patient Education  2020 [employer].
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
## 2022-01-12 ENCOUNTER — Encounter (HOSPITAL_BASED_OUTPATIENT_CLINIC_OR_DEPARTMENT_OTHER): Payer: Self-pay | Admitting: Cardiovascular Disease

## 2022-01-20 ENCOUNTER — Telehealth: Payer: Self-pay | Admitting: Pharmacist

## 2022-01-20 ENCOUNTER — Ambulatory Visit: Payer: Medicare Other | Admitting: Pharmacist

## 2022-01-20 DIAGNOSIS — E785 Hyperlipidemia, unspecified: Secondary | ICD-10-CM

## 2022-01-20 DIAGNOSIS — E1169 Type 2 diabetes mellitus with other specified complication: Secondary | ICD-10-CM

## 2022-01-20 NOTE — Chronic Care Management (AMB) (Signed)
Care Management   Follow Up Note   01/20/2022 Name: Margaret Dyer MRN: [id number] DOB: Apr 22, 1935   Referred by: Ann Held, DO Reason for referral : No chief complaint on file.   Third unsuccessful telephone outreach was attempted today. The patient was referred to the case management team for assistance with care management and care coordination. The patient's primary care provider has been notified of our unsuccessful attempts to make or maintain contact with the patient. The care management team is pleased to engage with this patient at any time in the future should he/she be interested in assistance from the care management team.   Follow Up Plan: We have been unable to make contact with the patient for follow up. The care management team is available to follow up with the patient after provider conversation with the patient regarding recommendation for care management engagement and subsequent re-referral to the care management team.   Cherre Robins, PharmD Clinical Pharmacist [facility] SW [facility]

## 2022-01-20 NOTE — Telephone Encounter (Signed)
Care Management   Follow Up Note   01/20/2022 Name: Margaret Dyer MRN: [id number] DOB: September 15, 1935   Referred by: Ann Held, DO Reason for referral : Appointment (Chronic Care Management follow up)   Third unsuccessful telephone outreach was attempted today. The patient was referred to the case management team for assistance with care management and care coordination. The patient's primary care provider has been notified of our unsuccessful attempts to make or maintain contact with the patient. The care management team is pleased to engage with this patient at any time in the future should he/she be interested in assistance from the care management team.   Follow Up Plan: We have been unable to make contact with the patient for follow up. The care management team is available to follow up with the patient after provider conversation with the patient regarding recommendation for care management engagement and subsequent re-referral to the care management team.   Cherre Robins, PharmD Clinical Pharmacist [facility] SW [facility]

## 2022-01-26 ENCOUNTER — Ambulatory Visit (INDEPENDENT_AMBULATORY_CARE_PROVIDER_SITE_OTHER): Payer: Medicare Other | Admitting: Internal Medicine

## 2022-01-26 ENCOUNTER — Encounter: Payer: Self-pay | Admitting: Internal Medicine

## 2022-01-26 ENCOUNTER — Other Ambulatory Visit: Payer: Self-pay

## 2022-01-26 ENCOUNTER — Telehealth (HOSPITAL_BASED_OUTPATIENT_CLINIC_OR_DEPARTMENT_OTHER): Payer: Self-pay | Admitting: *Deleted

## 2022-01-26 VITALS — BP 136/56 | HR 62 | Ht 63.05 in | Wt 198.4 lb

## 2022-01-26 DIAGNOSIS — N183 Chronic kidney disease, stage 3 unspecified: Secondary | ICD-10-CM | POA: Diagnosis not present

## 2022-01-26 DIAGNOSIS — I10 Essential (primary) hypertension: Secondary | ICD-10-CM | POA: Diagnosis not present

## 2022-01-26 DIAGNOSIS — I5032 Chronic diastolic (congestive) heart failure: Secondary | ICD-10-CM | POA: Diagnosis not present

## 2022-01-26 DIAGNOSIS — I6523 Occlusion and stenosis of bilateral carotid arteries: Secondary | ICD-10-CM | POA: Diagnosis not present

## 2022-01-26 DIAGNOSIS — R0902 Hypoxemia: Secondary | ICD-10-CM

## 2022-01-26 MED ORDER — CARVEDILOL 12.5 MG PO TABS: 12.5000 mg | ORAL_TABLET | Freq: Two times a day (BID) | ORAL | 3 refills | Status: AC

## 2022-01-26 NOTE — Patient Instructions (Signed)
Medication Instructions:  STOP metoprolol   START carvedilol 12.70m twice daily  *If you need a refill on your cardiac medications before your next appointment, please call your pharmacy*   Follow-Up: At [facility] you and your health needs are our priority.  As part of our continuing mission to provide you with exceptional heart care, we have created designated Provider Care Teams.  These Care Teams include your primary Cardiologist (physician) and Advanced Practice Providers (APPs -  Physician Assistants and Nurse Practitioners) who all work together to provide you with the care you need, when you need it.  We recommend signing up for the patient portal called "MyChart".  Sign up information is provided on this After Visit Summary.  MyChart is used to connect with patients for Virtual Visits (Telemedicine).  Patients are able to view lab/test results, encounter notes, upcoming appointments, etc.  Non-urgent messages can be sent to your provider as well.   To learn more about what you can do with MyChart, go to [email or web address]    Your next appointment:    Keep appointment with Dr. ROval Linsey  Follow up with Dr. HDebara Pickettin 1 year

## 2022-01-26 NOTE — Progress Notes (Signed)
OFFICE NOTE  Chief Complaint:  Follow-up hypertension  Primary Care Physician: Carollee Herter, Alferd Apa, DO  HPI:  Margaret Dyer is a 87 y.o. female with a past medial history significant for tobacco abuse, hypertension, dyslipidemia, cirrhosis, diabetes type 2 and lower extremity edema. She presented the hospital in June 2018 with several weeks of dyspnea on exertion and 10-15 pound weight gain. She had bilateral pleural effusions and an elevated BNP of 219. Echo showed an EF of 60-65% with grade 2 diastolic dysfunction. She underwent right heart catheterization by Dr. Ellyn Hack on 05/18/2017 which showed severe diastolic heart failure and an elevated pulmonary capillary wedge pressure 32 mmHg. There is also felt to be component of pulmonary arterial hypertension. Ultimately she had a [employer] which showed no reversible ischemia. This was performed due to history of coronary calcifications. She also has chronic kidney disease and we preferred to avoid any contrast dye exposure such as with left heart catheterization. She was seen in follow-up by Sharrell Ku, PA-C in July who ordered her stress test and felt that she was doing well from cardiac standpoint. Her weight was 203 pounds. Today it's 200 pounds. She continues to take her diuretic and feels like she is doing well without any worsening shortness of breath.  05/03/2018  Mrs. Bohne returns today for follow-up.  Overall she is doing well.  Her weight is significantly lower than it was during the hospital.  Now down about 8 to 10 pounds.  She has been getting some occasional dizziness.  This could be worse with change in position or bending over.  She has a history of neuropathy which can contribute to this and does have a new patient visit with neurology at the end of June.  She is also on twice daily Lasix and I wonder if she could be somewhat over diuresed.  She has no issues with edema.  She is also had some concerns about knots in her legs  which could be varicose veins.  There are signs of bilateral lower extremity venous insufficiency.  11/08/2020  Mrs. Pouncey is seen today in follow-up. She was last seen last summer by Doreene Adas, PA-C. Overall she seems to have been doing pretty well. Her blood pressure was not well controlled and she was started on lisinopril. This did cause her cough and ultimately she was switched over to losartan. She does have an issue with some degree of chronic kidney disease. She denies any worsening shortness of breath or edema. Blood pressure is again elevated today. She says she generally checks it at home perhaps on a weekly basis and gets around 741 systolic. She says she is not had any recent labs and they were last performed in 2020. She also says is difficult for her to get to her primary care provider's office would like lab work checked today. She is not fasting.  07/20/2021  Mrs. Lacson returns today for follow-up.  I just saw her via virtual visit.  She was having worsening shortness of breath and hypoxemia.  Was felt that she was also having more swelling and I increased her torsemide to 20 mg twice a day.  She does not necessarily notice significant improvement in her breathing or reduction in her swelling or weight.  Her weight today actually is about similar to what it was a month ago.  She says her home weights have been fairly stable as well.  Blood pressure remains elevated today 153/66 however at home up to  about 170 systolic.  She reports some good response to hydralazine however only on the low-dose 3 times a day.  10/31/2021  Mrs. Tarr is seen today for follow-up.  She reports fairly stable shortness of breath.  She had been seen recently by pulmonary for sleep study but was not able to sleep in for the study to be diagnostic.  She is on 3 L oxygen.  She has an upcoming pulmonary function test in follow-up in January.  She reports some issues with her nose particular related to deviated  septum after breaking it in the past.  She has never seen an ENT.  This could be contributing to some of her shortness of breath.  She is interested in a referral to a [place] provider due to transportation issues.  She says she sometimes misses her evening dose of torsemide.  I had recently increased her hydralazine to 50 mg 3 times daily but blood pressure remains elevated.  She reports compliance with the medicine.  01/26/2022  Mrs. Claros returns today for follow-up.  She reports her nasal septal deviation issues have improved.  She says when she gets congested she just needs to open it up and she is breathing better.  Blood pressure continues to be elevated at home.  She saw Dr. Oval Linsey in the hypertension clinic who increased her hydralazine up to 100 mg 3 times a day.  She could not tolerate amlodipine.  Her blood pressure here was 136/56 however home readings range between 017-494 systolic for the most part with a couple lower readings.  She reports a newer blood pressure cuff but has not necessarily had it correlated with the office readings.  Additional management options are limited.  Mrs. Schader also had carotid Dopplers which showed minimal bilateral disease and no significant subclavian stenosis even though she was noted to have differential arm blood pressures  PMHx:  Past Medical History:  Diagnosis Date   Asthma    Blood transfusion    Chronic diastolic CHF (congestive heart failure) (HCC)    CKD (chronic kidney disease), stage III (HCC)    Coronary artery calcification seen on CT scan    Diabetes mellitus    Erythropoietin deficiency anemia 09/19/2021   Goiter    Hyperlipidemia    Hypertension    Hypothyroidism    Iron deficiency anemia due to chronic blood loss 09/19/2021   Liver cirrhosis (HCC)    NASH (nonalcoholic steatohepatitis)    Obesity    Osteopenia    Pulmonary hypertension (HCC)    Renal cell carcinoma    a. prior nephrectomy.   Right heart failure (HCC)     Thrombocytopenia ([place])     Past Surgical History:  Procedure Laterality Date   ABDOMINAL HYSTERECTOMY     APPENDECTOMY     HEMORRHOID SURGERY     INTRAMEDULLARY (IM) NAIL INTERTROCHANTERIC Left 12/25/2018   Procedure: INTRAMEDULLARY (IM) NAIL INTERTROCHANTRIC;  Surgeon: Nicholes Stairs, MD;  Location: [facility];  Service: Orthopedics;  Laterality: Left;   KNEE SURGERY     NEPHRECTOMY  09.2000    FAMHx:  Family History  Problem Relation Age of Onset   Coronary artery disease Mother    Kidney cancer Father    Cancer Father        renal   COPD Father    Congestive Heart Failure Father    Cancer Sister        bladder   Coronary artery disease Other    Diabetes Other  Hyperlipidemia Other    Hypertension Other    Rheumatologic disease Neg Hx     SOCHx:   reports that she quit smoking about 48 years ago. Her smoking use included cigarettes. She smoked an average of .25 packs per day. She has never used smokeless tobacco. She reports that she does not drink alcohol and does not use drugs.  ALLERGIES:  Allergies  Allergen Reactions   Other Other (See Comments)    Patient has hemorrhaged at least 4 times in her lifetime   Bactrim [Sulfamethoxazole-Trimethoprim]    Cefuroxime Axetil Nausea Only    Upset stomach    Ciprofloxacin Nausea Only    Upset stomach    ROS: Pertinent items noted in HPI and remainder of comprehensive ROS otherwise negative.  HOME MEDS: Current Outpatient Medications on File Prior to Visit  Medication Sig Dispense Refill   ACCU-CHEK GUIDE test strip CHECK BLOOD GLUCOSE FOUR TIMES DAILY     Accu-Chek Softclix Lancets lancets Check BG 4 times/day.  Dx E11.65     albuterol (PROVENTIL) (2.5 MG/3ML) 0.083% nebulizer solution Take 3 mLs (2.5 mg total) by nebulization every 6 (six) hours as needed for wheezing or shortness of breath. 75 mL 12   aspirin EC 81 MG EC tablet Take 1 tablet (81 mg total) by mouth daily.     docusate sodium (COLACE) 100  MG capsule Take 1 capsule (100 mg total) by mouth 2 (two) times daily. 10 capsule 0   ezetimibe-simvastatin (VYTORIN) 10-20 MG tablet Take 1 tablet by mouth daily. 90 tablet 1   hydrALAZINE (APRESOLINE) 100 MG tablet Take 1 tablet (100 mg total) by mouth 3 (three) times daily. 90 tablet 5   insulin glargine (LANTUS) 100 UNIT/ML injection Inject 0.25 mLs (25 Units total) into the skin at bedtime.     Insulin Glulisine (APIDRA SOLOSTAR) 100 UNIT/ML Solostar Pen Inject 10-15 Units into the skin 3 (three) times daily before meals.      ipratropium-albuterol (DUONEB) 0.5-2.5 (3) MG/3ML SOLN Take 3 mLs by nebulization 3 (three) times daily. 360 mL 3   levothyroxine (SYNTHROID, LEVOTHROID) 25 MCG tablet take 1 tablet by mouth daily (Patient taking differently: Take 25 mcg by mouth. Daily except none on Saturdays and Sundays) 30 tablet 5   LORazepam (ATIVAN) 0.5 MG tablet Take 1 tablet (0.5 mg total) by mouth daily as needed for anxiety. 30 tablet 1   metoprolol tartrate (LOPRESSOR) 25 MG tablet TAKE 1 TABLET BY MOUTH TWICE DAILY 180 tablet 3   Multiple Vitamins-Minerals (CENTRUM SILVER ULTRA WOMENS PO) Take 1 tablet by mouth at bedtime.      nystatin cream (MYCOSTATIN) Apply 1 application topically 2 (two) times daily. 30 g 3   ofloxacin (FLOXIN OTIC) 0.3 % OTIC solution Place 10 drops into the right ear daily. 10 mL 0   oxybutynin (DITROPAN) 5 MG tablet Take 1 tablet (5 mg total) by mouth daily. 90 tablet 1   polyethylene glycol (MIRALAX / GLYCOLAX) 17 g packet Take 17 g by mouth daily as needed.     potassium chloride SA (KLOR-CON) 20 MEQ tablet TAKE 1 TABLET BY MOUTH DAILY AT BEDTIME 30 tablet 11   sucralfate (CARAFATE) 1 g tablet Take 1 tablet (1 g total) by mouth 4 (four) times daily -  with meals and at bedtime. 90 tablet 0   torsemide (DEMADEX) 20 MG tablet Take 2 tablets (34m) by mouth in the morning and 1 tablet (264m in the afternoon. 225 tablet 3  traMADol (ULTRAM) 50 MG tablet Take 1 tablet  (50 mg total) by mouth every 6 (six) hours as needed for moderate pain. 60 tablet 1   No current facility-administered medications on file prior to visit.    LABS/IMAGING: No results found. However, due to the size of the patient record, not all encounters were searched. Please check Results Review for a complete set of results. No results found.  LIPID PANEL:    Component Value Date/Time   CHOL 202 (H) 12/13/2021 1516   CHOL 122 11/08/2020 1638   TRIG 186.0 (H) 12/13/2021 1516   HDL 44.50 12/13/2021 1516   HDL 45 11/08/2020 1638   CHOLHDL 5 12/13/2021 1516   VLDL 37.2 12/13/2021 1516   LDLCALC 121 (H) 12/13/2021 1516   LDLCALC 58 11/08/2020 1638   LDLDIRECT 89.0 01/19/2015 1659     WEIGHTS: Wt Readings from Last 3 Encounters:  01/26/22 198 lb 6.4 oz (90 kg)  01/11/22 196 lb 9.6 oz (89.2 kg)  12/24/21 198 lb (89.8 kg)    VITALS: BP (!) 136/56    Pulse 62    Ht 5' 3.05" (1.601 m)    Wt 198 lb 6.4 oz (90 kg)    LMP  (LMP Unknown)    SpO2 98%    BMI 35.09 kg/m   EXAM: Deferred  EKG: Deferred  ASSESSMENT: Chronic diastolic congestive heart failure, LVEF 60-65% (05/2017) Nonischemic Myoview with EF 71% (06/2017) Uncontrolled hypertension Cirrhosis Insulin-dependent diabetes mellitus Dyslipidemia Hypothyroidism  PLAN: 1.   Mrs. Dragoo continues to have issues with uncontrolled hypertension.  She saw Dr. Oval Linsey in the advanced hypertension clinic and had her hydralazine increased to 100 mg 3 times daily.  Amlodipine was stopped because she was intolerant of it.  Options are very limited as far as additional therapy but I recommended changing her metoprolol over to carvedilol 12 and half milligrams twice a day.  She should continue to monitor home blood pressures.  She has a follow-up virtually with Dr. Oval Linsey in April.  I will message her to see if she has any further suggestions.  Follow-up with me annually or sooner as necessary  Pixie Casino, MD, [facility], [place] Director of the Advanced Lipid Disorders &  Cardiovascular Risk Reduction Clinic Diplomate of the American Board of Clinical Lipidology Attending Cardiologist  Direct Dial: [phone]   Fax: [phone]  Website:  [email or web address] 01/26/2022, 2:39 PM

## 2022-01-26 NOTE — Telephone Encounter (Signed)
Spoke with patient, stated blood pressure still running 150's-180's even with increase in Hydralazine  Will forward to Dr Oval Linsey and Sherian Rein D for review

## 2022-01-27 NOTE — Telephone Encounter (Signed)
Looks like Dr Debara Pickett suggested switching metoprolol to carvedilol yesterday. Recommend she continue to monitor once she starts the new medication

## 2022-01-30 ENCOUNTER — Other Ambulatory Visit (HOSPITAL_COMMUNITY): Payer: Medicare Other

## 2022-01-31 ENCOUNTER — Ambulatory Visit (HOSPITAL_COMMUNITY)
Admission: RE | Admit: 2022-01-31 | Discharge: 2022-01-31 | Disposition: A | Discharge status: Home / Self Care | Payer: Medicare Other | Source: Ambulatory Visit | Attending: Pulmonary Disease | Admitting: Pulmonary Disease

## 2022-01-31 ENCOUNTER — Other Ambulatory Visit: Payer: Self-pay

## 2022-01-31 DIAGNOSIS — J9690 Respiratory failure, unspecified, unspecified whether with hypoxia or hypercapnia: Secondary | ICD-10-CM | POA: Diagnosis not present

## 2022-01-31 DIAGNOSIS — J90 Pleural effusion, not elsewhere classified: Secondary | ICD-10-CM | POA: Diagnosis not present

## 2022-01-31 DIAGNOSIS — J9611 Chronic respiratory failure with hypoxia: Secondary | ICD-10-CM | POA: Insufficient documentation

## 2022-01-31 DIAGNOSIS — I3139 Other pericardial effusion (noninflammatory): Secondary | ICD-10-CM | POA: Diagnosis not present

## 2022-02-03 ENCOUNTER — Telehealth: Payer: Self-pay | Admitting: Family Medicine

## 2022-02-03 NOTE — Telephone Encounter (Signed)
Left message for patient to call back and schedule Medicare Annual Wellness Visit (AWV) in office.  ° °If not able to come in office, please offer to do virtually or by telephone.  Left office number and my jabber #[phone]. ° °Due for AWVI ° °Please schedule at anytime with Nurse Health Advisor. °  °

## 2022-02-07 DIAGNOSIS — R300 Dysuria: Secondary | ICD-10-CM | POA: Diagnosis not present

## 2022-02-07 DIAGNOSIS — N390 Urinary tract infection, site not specified: Secondary | ICD-10-CM | POA: Diagnosis not present

## 2022-02-10 ENCOUNTER — Ambulatory Visit (INDEPENDENT_AMBULATORY_CARE_PROVIDER_SITE_OTHER): Payer: Medicare Other | Admitting: Pulmonary Disease

## 2022-02-10 ENCOUNTER — Encounter: Payer: Self-pay | Admitting: Pulmonary Disease

## 2022-02-10 ENCOUNTER — Other Ambulatory Visit: Payer: Self-pay

## 2022-02-10 VITALS — BP 140/62 | HR 64 | Temp 97.9°F | Ht 63.0 in | Wt 198.6 lb

## 2022-02-10 DIAGNOSIS — J9611 Chronic respiratory failure with hypoxia: Secondary | ICD-10-CM | POA: Diagnosis not present

## 2022-02-10 DIAGNOSIS — J9811 Atelectasis: Secondary | ICD-10-CM | POA: Diagnosis not present

## 2022-02-10 DIAGNOSIS — R0602 Shortness of breath: Secondary | ICD-10-CM | POA: Diagnosis not present

## 2022-02-10 NOTE — Progress Notes (Signed)
Subjective:   PATIENT ID: Margaret Dyer: female DOB: [birth year], MRN: [id number]   HPI  Chief Complaint  Patient presents with   Follow-up    Follow up. Patient says she is still short of breath and her bp is not as good as it should be.    Reason for Visit: Follow-up  [name] is a 87 year old female with with negligible smoking history with asthma, DM2, HTN, pulmonary hypertension, chronic diastolic heart failure, cirrhosis secondary to NASH, RCC s/p left nephrectomy, stage IIIb CKD who presents for follow-up.  She is mainly non-ambulatory and uses a walker or wheelchair at home. After her hospitalization for acute diastolic heart failure in June 2022,  she was discharged on increased oxygen three months ago. She is currently on 3L O2. Before this she was on 2L oxygen 6 months prior. She has had multiple ED visits for various medical issues including UTI and heart failure. She is followed by Cardiology with Dr. Debara Pickett who has recently adjusted her diuretics on 07/20/21 for lower extremity edema.   12/20/21 Since our last visit, she continues to have shortness of breath at baseline. Back pain worsens her shortness of breath. Denies cough or wheezing. No lower extremity edema or chest pain. Has been compliant with her oxygen. She was unable to complete sleep study due to restlessness. Completed PFTs for dyspnea work-up and awaiting to discuss results.  02/10/22 Since our last visit she reports she continues to have shortness of breath. She has been working on taking deep breaths. She admits that she is not as active as she could be and mainly sedentary. She uses Duonebs daily. Has a incentive spirometer at home that she does not use.   Social History: Smoke 1/4 ppd x 4 years. Quit >40 years ago   Past Medical History:  Diagnosis Date   Asthma    Blood transfusion    Chronic diastolic CHF (congestive heart failure) (HCC)    CKD (chronic kidney disease), stage III (HCC)     Coronary artery calcification seen on CT scan    Diabetes mellitus    Erythropoietin deficiency anemia 09/19/2021   Goiter    Hyperlipidemia    Hypertension    Hypothyroidism    Iron deficiency anemia due to chronic blood loss 09/19/2021   Liver cirrhosis (HCC)    NASH (nonalcoholic steatohepatitis)    Obesity    Osteopenia    Pulmonary hypertension (HCC)    Renal cell carcinoma    a. prior nephrectomy.   Right heart failure (HCC)    Thrombocytopenia (HCC)      Family History  Problem Relation Age of Onset   Coronary artery disease Mother    Kidney cancer Father    Cancer Father        renal   COPD Father    Congestive Heart Failure Father    Cancer Sister        bladder   Coronary artery disease Other    Diabetes Other    Hyperlipidemia Other    Hypertension Other    Rheumatologic disease Neg Hx      Social History   Occupational History    Comment: retired  Tobacco Use   Smoking status: Former    Packs/day: 0.25    Types: Cigarettes    Quit date: 12/04/1973    Years since quitting: 48.2   Smokeless tobacco: Never   Tobacco comments:    05/30/18 none in 40 years  Vaping Use   Vaping Use: Never used  Substance and Sexual Activity   Alcohol use: No    Alcohol/week: 0.0 standard drinks   Drug use: No   Sexual activity: Not on file    Allergies  Allergen Reactions   Other Other (See Comments)    Patient has hemorrhaged at least 4 times in her lifetime   Bactrim [Sulfamethoxazole-Trimethoprim]    Cefuroxime Axetil Nausea Only    Upset stomach    Ciprofloxacin Nausea Only    Upset stomach     Outpatient Medications Prior to Visit  Medication Sig Dispense Refill   ACCU-CHEK GUIDE test strip CHECK BLOOD GLUCOSE FOUR TIMES DAILY     Accu-Chek Softclix Lancets lancets Check BG 4 times/day.  Dx E11.65     albuterol (PROVENTIL) (2.5 MG/3ML) 0.083% nebulizer solution Take 3 mLs (2.5 mg total) by nebulization every 6 (six) hours as needed for wheezing or  shortness of breath. 75 mL 12   aspirin EC 81 MG EC tablet Take 1 tablet (81 mg total) by mouth daily.     carvedilol (COREG) 12.5 MG tablet Take 1 tablet (12.5 mg total) by mouth 2 (two) times daily. 180 tablet 3   cefdinir (OMNICEF) 300 MG capsule Take 300 mg by mouth 2 (two) times daily. For 7 days     docusate sodium (COLACE) 100 MG capsule Take 1 capsule (100 mg total) by mouth 2 (two) times daily. 10 capsule 0   ezetimibe-simvastatin (VYTORIN) 10-20 MG tablet Take 1 tablet by mouth daily. 90 tablet 1   hydrALAZINE (APRESOLINE) 100 MG tablet Take 1 tablet (100 mg total) by mouth 3 (three) times daily. 90 tablet 5   insulin glargine (LANTUS) 100 UNIT/ML injection Inject 0.25 mLs (25 Units total) into the skin at bedtime.     Insulin Glulisine (APIDRA SOLOSTAR) 100 UNIT/ML Solostar Pen Inject 10-15 Units into the skin 3 (three) times daily before meals.      ipratropium-albuterol (DUONEB) 0.5-2.5 (3) MG/3ML SOLN Take 3 mLs by nebulization 3 (three) times daily. 360 mL 3   levothyroxine (SYNTHROID, LEVOTHROID) 25 MCG tablet take 1 tablet by mouth daily (Patient taking differently: Take 25 mcg by mouth. Daily except none on Saturdays and Sundays) 30 tablet 5   LORazepam (ATIVAN) 0.5 MG tablet Take 1 tablet (0.5 mg total) by mouth daily as needed for anxiety. 30 tablet 1   Multiple Vitamins-Minerals (CENTRUM SILVER ULTRA WOMENS PO) Take 1 tablet by mouth at bedtime.      nystatin cream (MYCOSTATIN) Apply 1 application topically 2 (two) times daily. 30 g 3   ofloxacin (FLOXIN OTIC) 0.3 % OTIC solution Place 10 drops into the right ear daily. 10 mL 0   oxybutynin (DITROPAN) 5 MG tablet Take 1 tablet (5 mg total) by mouth daily. 90 tablet 1   polyethylene glycol (MIRALAX / GLYCOLAX) 17 g packet Take 17 g by mouth daily as needed.     potassium chloride SA (KLOR-CON) 20 MEQ tablet TAKE 1 TABLET BY MOUTH DAILY AT BEDTIME 30 tablet 11   sucralfate (CARAFATE) 1 g tablet Take 1 tablet (1 g total) by mouth  4 (four) times daily -  with meals and at bedtime. 90 tablet 0   torsemide (DEMADEX) 20 MG tablet Take 2 tablets (96m) by mouth in the morning and 1 tablet (2104m in the afternoon. 225 tablet 3   traMADol (ULTRAM) 50 MG tablet Take 1 tablet (50 mg total) by mouth every 6 (six) hours as  needed for moderate pain. 60 tablet 1   No facility-administered medications prior to visit.    Review of Systems  Constitutional:  Negative for chills, diaphoresis, fever, malaise/fatigue and weight loss.  HENT:  Negative for congestion.   Respiratory:  Positive for shortness of breath. Negative for cough, hemoptysis, sputum production and wheezing.   Cardiovascular:  Negative for chest pain, palpitations and leg swelling.    Objective:   Vitals:   02/10/22 1521  BP: 140/62  Pulse: 64  Temp: 97.9 F (36.6 C)  TempSrc: Oral  SpO2: 99%  Weight: 198 lb 9.6 oz (90.1 kg)  Height: _0  (1.6 m)    SpO2: 99 % O2 Device: Nasal cannula O2 Flow Rate (L/min): 3 L/min O2 Type: Continuous O2  Physical Exam: General: Elderly and chronically ill-appearing, no acute distress HENT: [place], AT Eyes: EOMI, no scleral icterus Respiratory: Diminished breath sounds bilaterally.  No crackles, wheezing or rales Cardiovascular: RRR, -M/R/G, no JVD Extremities:-Edema,-tenderness Neuro: AAO x4, CNII-XII grossly intact Psych: Normal mood, normal affect   Data Reviewed:  Imaging: CXR 07/04/21 - Cardiomegaly. Increased interstitial lung markings CT chest 01/31/2022-mild left lower lobe consolidation with small pleural effusion associated with atelectasis, subsegmental atelectasis in the lower lobes bilaterally.  Right upper lobe with 5 mm nodule.  PFT: 12/20/21 FVC 1.36 (58%) FEV1 1.25 (73%) Ratio 89  TLC 55% DLCO 64% Interpretation: Mild restrictive defect with mildly reduced DLCO. No significant BD response.  Echo: 05/26/2021 - EF 60-65%, grade II DD. No valvular or WMA  Labs: CBC CBC Latest Ref Rng & Units  12/24/2021 12/13/2021 10/25/2021  WBC 4.0 - 10.5 K/uL 7.1 6.1 6.7  Hemoglobin 12.0 - 15.0 g/dL 12.1 11.9(L) 11.4(L)  Hematocrit 36.0 - 46.0 % 37.0 36.7 35.5(L)  Platelets 150 - 400 K/uL 99(L) 106.0(L) 103(L)    BMET BMP Latest Ref Rng & Units 12/24/2021 12/13/2021 10/25/2021  Glucose 70 - 99 mg/dL 190(H) 209(H) 307(H)  BUN 8 - 23 mg/dL 42(H) 50(H) 54(H)  Creatinine 0.44 - 1.00 mg/dL 1.45(H) 1.44(H) 1.39(H)  BUN/Creat Ratio 12 - 28 - - -  Sodium 135 - 145 mmol/L 140 139 138  Potassium 3.5 - 5.1 mmol/L 3.9 3.9 4.6  Chloride 98 - 111 mmol/L 98 94(L) 96(L)  CO2 22 - 32 mmol/L 32 37(H) 35(H)  Calcium 8.9 - 10.3 mg/dL 9.0 9.3 9.9  Anemia - improving Thrombocytopenia.  CKD     Assessment & Plan:   Discussion: 86 year old female with multiple comorbidities including chronic hypoxemic respiratory failure secondary to unknown etiology.  Prior PFTs demonstrated restrictive defect with mildly reduced DLCO.  Reviewed CT today which demonstrated atelectasis bilaterally which can cause shunting. We discussed deep breathing techniques and encouraged daily aerobic activity up to 20 minutes.  Shortness of breath Atelectasis Chronic hypoxemic respiratory failure, unclear etiology but suspect multifactorial with suspected secondary pulmonary hypertension. Hx of asthma but would not expect hypoxemia from this. Will need to rule out OSA. CT Chest with prior scarring. Heart failure and deconditioning likely contributing to her symptoms. --START using incentive spiromete --CONTINUE using Duonebs TWICE a day --CONTINUE supplemental oxygen. Wear 3L with activity and sleep. --Unable to complete split night study due to restlessness  Anemia, within normal limits Chronic thrombocytopenia --Last colonoscopy 5-10 years ago with small polyp removal and normal per patient  --Followed by Hematology, Dr. Marin Olp   Health Maintenance Immunization History  Administered Date(s) Administered   Influenza Split  09/29/2011   Influenza Whole 09/04/1999, 09/20/2007, 09/04/2008, 01/18/2010, 09/19/2010  Influenza, High Dose Seasonal PF 01/05/2014, 09/23/2015, 10/24/2016, 11/09/2017   Influenza, Seasonal, Injecte, Preservative Fre 12/05/2012   Influenza,inj,Quad PF,6+ Mos 10/27/2014, 09/19/2021   Influenza-Unspecified 10/04/2018   Moderna Sars-Covid-2 Vaccination 02/12/2020, 03/22/2020, 11/06/2020   Pneumococcal Conjugate-13 05/28/2017   Tdap 10/24/2016   CT Lung Screen - not qualified. Negligible smoking history  No orders of the defined types were placed in this encounter.  No orders of the defined types were placed in this encounter.   Return in about 4 months (around 06/12/2022).  I have spent a total time of 32-minutes on the day of the appointment reviewing prior documentation, coordinating care and discussing medical diagnosis and plan with the patient/family. Past medical history, allergies, medications were reviewed. Pertinent imaging, labs and tests included in this note have been reviewed and interpreted independently by me.  DeRidder, MD [facility] 02/10/2022 3:34 PM  Office Number [phone]

## 2022-02-10 NOTE — Patient Instructions (Signed)
Shortness of breath ?Atelectasis ?Chronic hypoxemic respiratory failure, unclear etiology but suspect multifactorial with suspected secondary pulmonary hypertension. Hx of asthma but would not expect hypoxemia from this. CT Chest with atelectasis. Heart failure and deconditioning likely contributing to her symptoms. ?--START using incentive spirometer ?--CONTINUE using Duonebs TWICE a day ?--CONTINUE supplemental oxygen. Wear 3L with activity and sleep ? ?Follow-up with me in 4 months ?

## 2022-02-13 ENCOUNTER — Telehealth: Payer: Self-pay | Admitting: Cardiovascular Disease

## 2022-02-13 NOTE — Telephone Encounter (Signed)
Patient is calling stating that when she was last seen Dr. Oval Linsey gave her a BP log book that she says is a life saver for her. She is almost filled the book and is wanting to know if another one can be mailed to her since her next appt is virtual. Please advise.  ?

## 2022-02-13 NOTE — Telephone Encounter (Signed)
Called patient to confirm address and let her know logs will be mailed out today. ?TSuits [facility] RN CCM ?

## 2022-03-02 DIAGNOSIS — Z794 Long term (current) use of insulin: Secondary | ICD-10-CM | POA: Diagnosis not present

## 2022-03-02 DIAGNOSIS — N1832 Chronic kidney disease, stage 3b: Secondary | ICD-10-CM | POA: Diagnosis not present

## 2022-03-02 DIAGNOSIS — E039 Hypothyroidism, unspecified: Secondary | ICD-10-CM | POA: Diagnosis not present

## 2022-03-02 DIAGNOSIS — E1122 Type 2 diabetes mellitus with diabetic chronic kidney disease: Secondary | ICD-10-CM | POA: Diagnosis not present

## 2022-03-02 DIAGNOSIS — Z79890 Hormone replacement therapy (postmenopausal): Secondary | ICD-10-CM | POA: Diagnosis not present

## 2022-03-05 DIAGNOSIS — M549 Dorsalgia, unspecified: Secondary | ICD-10-CM | POA: Diagnosis not present

## 2022-03-05 DIAGNOSIS — R531 Weakness: Secondary | ICD-10-CM | POA: Diagnosis not present

## 2022-03-05 DIAGNOSIS — R5383 Other fatigue: Secondary | ICD-10-CM | POA: Diagnosis not present

## 2022-03-05 DIAGNOSIS — R739 Hyperglycemia, unspecified: Secondary | ICD-10-CM | POA: Diagnosis not present

## 2022-03-05 DIAGNOSIS — I959 Hypotension, unspecified: Secondary | ICD-10-CM | POA: Diagnosis not present

## 2022-03-08 ENCOUNTER — Telehealth: Payer: Self-pay | Admitting: *Deleted

## 2022-03-08 NOTE — Telephone Encounter (Signed)
Patient called and was so concerned about her transportation. Contacted Christian by email to get her transportation scheduled. ?
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
## 2022-03-09 ENCOUNTER — Telehealth (INDEPENDENT_AMBULATORY_CARE_PROVIDER_SITE_OTHER): Payer: Medicare Other | Admitting: Cardiovascular Disease

## 2022-03-09 ENCOUNTER — Encounter (HOSPITAL_BASED_OUTPATIENT_CLINIC_OR_DEPARTMENT_OTHER): Payer: Self-pay | Admitting: Cardiovascular Disease

## 2022-03-09 VITALS — BP 159/77 | HR 65 | Ht 63.0 in | Wt 198.0 lb

## 2022-03-09 DIAGNOSIS — I10 Essential (primary) hypertension: Secondary | ICD-10-CM

## 2022-03-09 DIAGNOSIS — Z5181 Encounter for therapeutic drug level monitoring: Secondary | ICD-10-CM | POA: Diagnosis not present

## 2022-03-09 DIAGNOSIS — I6523 Occlusion and stenosis of bilateral carotid arteries: Secondary | ICD-10-CM

## 2022-03-09 MED ORDER — VALSARTAN 160 MG PO TABS: 160.0000 mg | ORAL_TABLET | Freq: Every day | ORAL | 1 refills | Status: DC

## 2022-03-09 NOTE — Assessment & Plan Note (Signed)
Blood pressure remains uncontrolled.  She was previously on losartan and it was stopped for unclear reasons.  Given that she also has chronic kidney disease, ideally would like to get her on an ARB.  We will add valsartan 160 mg daily.  Check a basic metabolic panel in a week.  She will continue carvedilol, hydralazine, and torsemide.  She does note that she has been feeling more fatigued lately.  She thinks her iron levels are low and she needs an infusion.  It is unclear whether this is the cause or the carvedilol.  She is going to get an infusion and see her hematologist tomorrow.  If she does not need an infusion then we will need to consider reducing or changing her beta-blocker. ?

## 2022-03-09 NOTE — Patient Instructions (Addendum)
Medication Instructions:  ?START VALSARTAN 160 MG DAILY  ? ?Labwork: ?BMET IN 1 WEEK  ? ?Testing/Procedures: ?NONE ? ?Follow-Up: ?04/21/2022 AT 1:00 PM TO SEE PHARM D IN THE OFFICE  ? ?If you need a refill on your cardiac medications before your next appointment, please call your pharmacy. ?

## 2022-03-10 ENCOUNTER — Telehealth: Payer: Self-pay | Admitting: Cardiovascular Disease

## 2022-03-10 ENCOUNTER — Encounter (HOSPITAL_BASED_OUTPATIENT_CLINIC_OR_DEPARTMENT_OTHER): Payer: Self-pay | Admitting: *Deleted

## 2022-03-10 DIAGNOSIS — Z5181 Encounter for therapeutic drug level monitoring: Secondary | ICD-10-CM

## 2022-03-10 NOTE — Telephone Encounter (Addendum)
Returned call to patient who presents with many hesitations about her new medications. She states that she is worried about taking it with her other blood pressure medications and dropping her blood pressure too low. RN advised patient to trial the medication and keep a record of her blood pressure. She is advised to call the office if this occurs. Patient also instructed to present next week for lab work- she is unsure is she will come to do this. She was also made aware of her upcoming appointment. She is not happy about being asked to come into the office for her next appointment. She will let us know if she can make it or not.  ? ?Patient called back in:  ?Dr. Craig Staggers- seeing patient Tuesday and will be doing labs will send him a message and ask him to complete the labs and send to [place]  ? ? ?"Medication Instructions:  ?START VALSARTAN 160 MG DAILY  ?  ?Labwork: ?BMET IN 1 WEEK  ?  ?Testing/Procedures: ?NONE ?  ?Follow-Up: ?04/21/2022 AT 1:00 PM TO SEE PHARM D IN THE OFFICE " [facility]  ? ? ? ? ? ?

## 2022-03-10 NOTE — Telephone Encounter (Signed)
Patient had virtual visit with Dr Oval Linsey yesterday and called to follow up and go over AVS, she did not want to be called yesterday was too overwhelmed  ? ?Medication Instructions:  ?START VALSARTAN 160 MG DAILY  ?  ?Labwork: ?BMET IN 1 WEEK  ?  ?Testing/Procedures: ?NONE ?  ?Follow-Up: ?04/21/2022 AT 1:00 PM TO SEE PHARM D IN THE OFFICE  ? ? ?Called and left message to call back  ? ? ?

## 2022-03-10 NOTE — Telephone Encounter (Signed)
Pt returning a call to triage... please advise ?

## 2022-03-14 ENCOUNTER — Inpatient Hospital Stay: Payer: Medicare Other | Attending: Hematology & Oncology

## 2022-03-14 ENCOUNTER — Inpatient Hospital Stay: Payer: Medicare Other

## 2022-03-14 ENCOUNTER — Inpatient Hospital Stay (HOSPITAL_BASED_OUTPATIENT_CLINIC_OR_DEPARTMENT_OTHER): Payer: Medicare Other | Admitting: Family

## 2022-03-14 ENCOUNTER — Other Ambulatory Visit: Payer: Self-pay

## 2022-03-14 ENCOUNTER — Encounter: Payer: Self-pay | Admitting: Family

## 2022-03-14 VITALS — BP 115/53 | HR 73 | Temp 97.9°F | Resp 20 | Ht 63.0 in | Wt 192.1 lb

## 2022-03-14 VITALS — BP 154/58

## 2022-03-14 DIAGNOSIS — Z79899 Other long term (current) drug therapy: Secondary | ICD-10-CM | POA: Insufficient documentation

## 2022-03-14 DIAGNOSIS — D500 Iron deficiency anemia secondary to blood loss (chronic): Secondary | ICD-10-CM | POA: Diagnosis not present

## 2022-03-14 DIAGNOSIS — D631 Anemia in chronic kidney disease: Secondary | ICD-10-CM

## 2022-03-14 DIAGNOSIS — R531 Weakness: Secondary | ICD-10-CM | POA: Diagnosis not present

## 2022-03-14 DIAGNOSIS — N184 Chronic kidney disease, stage 4 (severe): Secondary | ICD-10-CM

## 2022-03-14 DIAGNOSIS — K7581 Nonalcoholic steatohepatitis (NASH): Secondary | ICD-10-CM | POA: Insufficient documentation

## 2022-03-14 DIAGNOSIS — G629 Polyneuropathy, unspecified: Secondary | ICD-10-CM | POA: Insufficient documentation

## 2022-03-14 DIAGNOSIS — R5383 Other fatigue: Secondary | ICD-10-CM | POA: Insufficient documentation

## 2022-03-14 DIAGNOSIS — D6959 Other secondary thrombocytopenia: Secondary | ICD-10-CM | POA: Insufficient documentation

## 2022-03-14 LAB — CBC WITH DIFFERENTIAL (CANCER CENTER ONLY)
Abs Immature Granulocytes: 0.02 10*3/uL (ref 0.00–0.07)
Basophils Absolute: 0 10*3/uL (ref 0.0–0.1)
Basophils Relative: 1 %
Eosinophils Absolute: 0.1 10*3/uL (ref 0.0–0.5)
Eosinophils Relative: 3 %
HCT: 30.8 % — ABNORMAL LOW (ref 36.0–46.0)
Hemoglobin: 10 g/dL — ABNORMAL LOW (ref 12.0–15.0)
Immature Granulocytes: 0 %
Lymphocytes Relative: 11 %
Lymphs Abs: 0.5 10*3/uL — ABNORMAL LOW (ref 0.7–4.0)
MCH: 30.5 pg (ref 26.0–34.0)
MCHC: 32.5 g/dL (ref 30.0–36.0)
MCV: 93.9 fL (ref 80.0–100.0)
Monocytes Absolute: 0.4 10*3/uL (ref 0.1–1.0)
Monocytes Relative: 10 %
Neutro Abs: 3.5 10*3/uL (ref 1.7–7.7)
Neutrophils Relative %: 75 %
Platelet Count: 86 10*3/uL — ABNORMAL LOW (ref 150–400)
RBC: 3.28 MIL/uL — ABNORMAL LOW (ref 3.87–5.11)
RDW: 13.6 % (ref 11.5–15.5)
WBC Count: 4.7 10*3/uL (ref 4.0–10.5)
nRBC: 0 % (ref 0.0–0.2)

## 2022-03-14 LAB — CMP (CANCER CENTER ONLY)
ALT: 13 U/L (ref 0–44)
AST: 23 U/L (ref 15–41)
Albumin: 3.7 g/dL (ref 3.5–5.0)
Alkaline Phosphatase: 42 U/L (ref 38–126)
Anion gap: 9 (ref 5–15)
BUN: 39 mg/dL — ABNORMAL HIGH (ref 8–23)
CO2: 31 mmol/L (ref 22–32)
Calcium: 9.2 mg/dL (ref 8.9–10.3)
Chloride: 99 mmol/L (ref 98–111)
Creatinine: 1.46 mg/dL — ABNORMAL HIGH (ref 0.44–1.00)
GFR, Estimated: 35 mL/min — ABNORMAL LOW (ref >60)
Glucose, Bld: 272 mg/dL — ABNORMAL HIGH (ref 70–99)
Potassium: 3.7 mmol/L (ref 3.5–5.1)
Sodium: 139 mmol/L (ref 135–145)
Total Bilirubin: 0.6 mg/dL (ref 0.3–1.2)
Total Protein: 6.9 g/dL (ref 6.5–8.1)

## 2022-03-14 LAB — RETICULOCYTES
Immature Retic Fract: 8.7 % (ref 2.3–15.9)
RBC.: 3.24 MIL/uL — ABNORMAL LOW (ref 3.87–5.11)
Retic Count, Absolute: 97.5 10*3/uL (ref 19.0–186.0)
Retic Ct Pct: 3 % (ref 0.4–3.1)

## 2022-03-14 LAB — IRON AND IRON BINDING CAPACITY (CC-WL,HP ONLY)
Iron: 74 ug/dL (ref 28–170)
Saturation Ratios: 24 % (ref 10.4–31.8)
TIBC: 305 ug/dL (ref 250–450)
UIBC: 231 ug/dL (ref 148–442)

## 2022-03-14 MED ORDER — SODIUM CHLORIDE 0.9 % IV SOLN
1000.0000 mg | Freq: Once | INTRAVENOUS | Status: AC
  Administered 2022-03-14: 1000 mg via INTRAVENOUS
  Filled 2022-03-14: qty 10

## 2022-03-14 MED ORDER — SODIUM CHLORIDE 0.9 % IV SOLN: Freq: Once | INTRAVENOUS | Status: AC

## 2022-03-14 NOTE — Patient Instructions (Signed)

## 2022-03-14 NOTE — Progress Notes (Signed)
?Hematology and Oncology Follow Up Visit ? ?Margaret Dyer ?[id number] ?May 21, 1935 87 y.o. ?03/14/2022 ? ? ?Principle Diagnosis:  ?Thrombocytopenia secondary to NASH ?Multifactorial anemia ?  ?Current Therapy:        ?Observation ?  ?Interim History:  Margaret Dyer is here today for follow-up. She is feeling fatigued, weak, SOB with any exertion on 3L [place] supplemental O2. She is quite pale today.  ?Hgb 10.0, MCV 93, WBC 4.7 and platelets 86.  ?She has occasional blood on her tissue from dry nose with supplemental O2.  ?No other blood loss noted. No abnormal bruising, no petechiae.  ?No fever, chills, n/v, cough, rash, dizziness, chest pain, palpitations, abdominal pain or changes in bowel or bladder habits.  ?No swelling or tenderness. Neuropathy in her hands and feet is unchanged from baseline.  ?No falls or syncope to report.  ?Her appetite is ok and she is doing her best to stay well hydrated. Her weight is stable at 192 lbs.  ? ?ECOG Performance Status: 2 - Symptomatic, <50% confined to bed ? ?Medications:  ?Allergies as of 03/14/2022   ? ?   Reactions  ? Other Other (See Comments)  ? Patient has hemorrhaged at least 4 times in her lifetime  ? Bactrim [sulfamethoxazole-trimethoprim]   ? Cefuroxime Axetil Nausea Only  ? Upset stomach  ? Ciprofloxacin Nausea Only  ? Upset stomach  ? Lisinopril Cough  ? ?  ? ?  ?Medication List  ?  ? ?  ? Accurate as of March 14, 2022  1:20 PM. If you have any questions, ask your nurse or doctor.  ?  ?  ? ?  ? ?Accu-Chek Guide test strip ?Generic drug: glucose blood ?CHECK BLOOD GLUCOSE FOUR TIMES DAILY ?  ?Accu-Chek Softclix Lancets lancets ?Check BG 4 times/day.  Dx E11.65 ?  ?albuterol (2.5 MG/3ML) 0.083% nebulizer solution ?Commonly known as: PROVENTIL ?Take 3 mLs (2.5 mg total) by nebulization every 6 (six) hours as needed for wheezing or shortness of breath. ?  ?Apidra SoloStar 100 UNIT/ML Solostar Pen ?Generic drug: insulin glulisine ?Inject 10-15 Units into the skin 3 (three) times  daily before meals. ?  ?aspirin 81 MG EC tablet ?Take 1 tablet (81 mg total) by mouth daily. ?  ?carvedilol 12.5 MG tablet ?Commonly known as: COREG ?Take 1 tablet (12.5 mg total) by mouth 2 (two) times daily. ?  ?CENTRUM SILVER ULTRA WOMENS PO ?Take 1 tablet by mouth at bedtime. ?  ?docusate sodium 100 MG capsule ?Commonly known as: COLACE ?Take 1 capsule (100 mg total) by mouth 2 (two) times daily. ?  ?ezetimibe-simvastatin 10-20 MG tablet ?Commonly known as: Vytorin ?Take 1 tablet by mouth daily. ?  ?hydrALAZINE 100 MG tablet ?Commonly known as: APRESOLINE ?Take 1 tablet (100 mg total) by mouth 3 (three) times daily. ?  ?insulin glargine 100 UNIT/ML injection ?Commonly known as: LANTUS ?Inject 0.25 mLs (25 Units total) into the skin at bedtime. ?  ?ipratropium-albuterol 0.5-2.5 (3) MG/3ML Soln ?Commonly known as: DUONEB ?Take 3 mLs by nebulization 3 (three) times daily. ?  ?levothyroxine 25 MCG tablet ?Commonly known as: SYNTHROID ?take 1 tablet by mouth daily ?What changed:  ?when to take this ?additional instructions ?  ?LORazepam 0.5 MG tablet ?Commonly known as: ATIVAN ?Take 1 tablet (0.5 mg total) by mouth daily as needed for anxiety. ?  ?nystatin cream ?Commonly known as: MYCOSTATIN ?Apply 1 application topically 2 (two) times daily. ?  ?oxybutynin 5 MG tablet ?Commonly known as: DITROPAN ?Take 1 tablet (  5 mg total) by mouth daily. ?  ?polyethylene glycol 17 g packet ?Commonly known as: MIRALAX / GLYCOLAX ?Take 17 g by mouth daily as needed. ?  ?potassium chloride SA 20 MEQ tablet ?Commonly known as: KLOR-CON M ?TAKE 1 TABLET BY MOUTH DAILY AT BEDTIME ?  ?sucralfate 1 g tablet ?Commonly known as: Carafate ?Take 1 tablet (1 g total) by mouth 4 (four) times daily -  with meals and at bedtime. ?  ?torsemide 20 MG tablet ?Commonly known as: DEMADEX ?Take 2 tablets (62m) by mouth in the morning and 1 tablet (252m in the afternoon. ?  ?traMADol 50 MG tablet ?Commonly known as: ULTRAM ?Take 1 tablet (50 mg  total) by mouth every 6 (six) hours as needed for moderate pain. ?  ?valsartan 160 MG tablet ?Commonly known as: Diovan ?Take 1 tablet (160 mg total) by mouth daily. ?  ? ?  ? ? ?Allergies:  ?Allergies  ?Allergen Reactions  ? Other Other (See Comments)  ?  Patient has hemorrhaged at least 4 times in her lifetime  ? Bactrim [Sulfamethoxazole-Trimethoprim]   ? Cefuroxime Axetil Nausea Only  ?  Upset stomach ?  ? Ciprofloxacin Nausea Only  ?  Upset stomach  ? Lisinopril Cough  ? ? ?Past Medical History, Surgical history, Social history, and Family History were reviewed and updated. ? ?Review of Systems: ?All other 10 point review of systems is negative.  ? ?Physical Exam: ? vitals were not taken for this visit.  ? ?Wt Readings from Last 3 Encounters:  ?03/09/22 198 lb (89.8 kg)  ?02/10/22 198 lb 9.6 oz (90.1 kg)  ?01/26/22 198 lb 6.4 oz (90 kg)  ? ? ?Ocular: Sclerae unicteric, pupils equal, round and reactive to light ?Ear-nose-throat: Oropharynx clear, dentition fair ?Lymphatic: No cervical or supraclavicular adenopathy ?Lungs no rales or rhonchi, good excursion bilaterally ?Heart regular rate and rhythm, no murmur appreciated ?Abd soft, nontender, positive bowel sounds ?MSK no focal spinal tenderness, no joint edema ?Neuro: non-focal, well-oriented, appropriate affect ?Breasts: Deferred  ? ?Lab Results  ?Component Value Date  ? WBC 7.1 12/24/2021  ? HGB 12.1 12/24/2021  ? HCT 37.0 12/24/2021  ? MCV 91.8 12/24/2021  ? PLT 99 (L) 12/24/2021  ? ?Lab Results  ?Component Value Date  ? FERRITIN 448 (H) 10/25/2021  ? IRON 97 10/25/2021  ? TIBC 283 10/25/2021  ? UIBC 186 10/25/2021  ? IRONPCTSAT 34 10/25/2021  ? ?Lab Results  ?Component Value Date  ? RETICCTPCT 2.5 10/25/2021  ? RBC 4.03 12/24/2021  ? ?No results found for: KPAFRELGTCHN, LAMBDASER, KAPLAMBRATIO ?No results found for: IGGSERUM, IGA, IGMSERUM ?No results found for: TOTALPROTELP, ALBUMINELP, A1GS, A2GS, BETS, BETA2SER, GAMS, MSPIKE, SPEI ?  Chemistry   ?    ?Component Value Date/Time  ? NA 140 12/24/2021 1143  ? NA 138 07/20/2021 1622  ? K 3.9 12/24/2021 1143  ? CL 98 12/24/2021 1143  ? CO2 32 12/24/2021 1143  ? BUN 42 (H) 12/24/2021 1143  ? BUN 32 (H) 07/20/2021 1622  ? CREATININE 1.45 (H) 12/24/2021 1143  ? CREATININE 1.39 (H) 10/25/2021 1506  ? CREATININE 1.06 (H) 11/30/2017 1559  ? GLU 136 12/03/2015 0000  ?    ?Component Value Date/Time  ? CALCIUM 9.0 12/24/2021 1143  ? ALKPHOS 51 12/24/2021 1143  ? AST 27 12/24/2021 1143  ? AST 20 10/25/2021 1506  ? ALT 19 12/24/2021 1143  ? ALT 17 10/25/2021 1506  ? BILITOT 0.8 12/24/2021 1143  ? BILITOT 0.5 10/25/2021 1506  ?  ? ? ? ?  Impression and Plan: Ms. Castillo is a very pleasant 87 yo caucasian female with multifactorial anemia and thrombocytopenia secondary to NASH.  ?She is quite symptomatic as mentioned above. She has not followed up with our office in 5 months.  ?We will proceed with Monoferric per patient request.  ?Follow-up in 8 weeks.  ? ?Lottie Dawson, NP ?4/11/20231:20 PM  ?

## 2022-03-15 ENCOUNTER — Telehealth: Payer: Self-pay | Admitting: Family Medicine

## 2022-03-15 LAB — FERRITIN: Ferritin: 164 ng/mL (ref 11–307)

## 2022-03-15 NOTE — Telephone Encounter (Signed)
Pt's daughter would like for lowne to call her to discuss pt's changes within health. Please advise.  ?

## 2022-03-15 NOTE — Telephone Encounter (Signed)
Spoke with patient's daughter. She states she would like to talk to you regarding a few things she has noticed about her mother over the last several weeks. I advised we should make appointment but pt's daughter states that the patient would lie about the concerns and would like to speak to you. She was advised you were out of the office to day. [name] can be reached at [phone] ?

## 2022-03-16 ENCOUNTER — Telehealth: Payer: Self-pay | Admitting: Cardiovascular Disease

## 2022-03-16 NOTE — Telephone Encounter (Signed)
Patient is requesting to have Dr. Oval Linsey review 4/11 labs completed with Dr. Marin Olp with the [facility]. She would also like to know why 5/19 PharmD appointment is necessary.  Patient also wanted to mention that she had an infusion on 4/11 at [facility]. ?

## 2022-03-16 NOTE — Telephone Encounter (Signed)
Hi Margaret Dyer, we have not done virtual visits before but I am willing to try.  Is patient monitoring her BP at home? ?

## 2022-03-16 NOTE — Telephone Encounter (Signed)
Labs with mild anemia - to be managed by Dr. Marin Olp. Stable kidney function.  ? ?Last seen via video visit by Dr. Oval Linsey and medication changes made as BP not at goal. PharmD appointment is to follow up on blood pressure. Recommend keeping appt as scheduled.  ? ?Loel Dubonnet, NP  ?

## 2022-03-16 NOTE — Telephone Encounter (Signed)
RN returned call to patient to discuss lab work and discuss reason for PharmD appointment. Patient states that it is hard for her to get to the office and would like to know if her PharmD appointment can be made virtual.  ? ?Will route to PharmD pool to get their thoughts on virtual vs in person appointment.  ? ? ? ? ? ? ? ? ?"Labs with mild anemia - to be managed by Dr. Marin Olp. Stable kidney function.  ?  ?Last seen via video visit by Dr. Oval Linsey and medication changes made as BP not at goal. PharmD appointment is to follow up on blood pressure. Recommend keeping appt as scheduled.  ?  ?Margaret Dubonnet, NP " ?

## 2022-03-20 ENCOUNTER — Other Ambulatory Visit: Payer: Self-pay | Admitting: Internal Medicine

## 2022-03-21 ENCOUNTER — Other Ambulatory Visit: Payer: Self-pay | Admitting: Pharmacist

## 2022-03-22 NOTE — Telephone Encounter (Signed)
Called talk with Pt daughter and she was advised  ?And she stated would call back to make appt at later day  ?

## 2022-03-27 ENCOUNTER — Other Ambulatory Visit: Payer: Self-pay | Admitting: Family Medicine

## 2022-03-27 DIAGNOSIS — F419 Anxiety disorder, unspecified: Secondary | ICD-10-CM

## 2022-03-27 NOTE — Telephone Encounter (Signed)
Patient had labs done, will forward to Dr Oval Linsey for review  ?

## 2022-03-27 NOTE — Telephone Encounter (Signed)
This encounter was created in error - please disregard. ?

## 2022-03-28 NOTE — Telephone Encounter (Signed)
Requesting:ativan 0.43m ?Contract: unknown ?[email or web address]?Last Visit:12/13/21 ?Next Visit:unknown ?Last Refill:12/13/21 ? ?Please Advise  ?

## 2022-03-29 ENCOUNTER — Telehealth: Payer: Self-pay | Admitting: Cardiovascular Disease

## 2022-03-29 NOTE — Telephone Encounter (Signed)
Called results to patient and left results on VM (ok per DPR), instructions left to call office back if patient has any questions!  ? ? ? ? ? ?"Renal function stable.  Continue valsartan.  ? ?TCR " ?

## 2022-03-29 NOTE — Telephone Encounter (Signed)
Left message to call back  

## 2022-03-29 NOTE — Telephone Encounter (Signed)
Patient returned call to verify VM and say thanks for calling.  ?

## 2022-03-29 NOTE — Telephone Encounter (Signed)
?  Pt is returning call ?

## 2022-03-29 NOTE — Telephone Encounter (Signed)
Patient transferred to Otilio Saber, RN ?

## 2022-04-18 ENCOUNTER — Other Ambulatory Visit: Payer: Self-pay | Admitting: Family Medicine

## 2022-04-18 DIAGNOSIS — R32 Unspecified urinary incontinence: Secondary | ICD-10-CM

## 2022-04-21 ENCOUNTER — Telehealth (HOSPITAL_BASED_OUTPATIENT_CLINIC_OR_DEPARTMENT_OTHER): Payer: Medicare Other

## 2022-04-21 NOTE — Progress Notes (Deleted)
04/21/2022 Margaret Dyer June 04, 1935 [id number]   HPI:  Margaret Dyer is a 87 y.o. female patient of Dr Debara Pickett, with a PMH below who presents today for advanced hypertension clinic follow up.  Dr. Debara Pickett referred patient after it was reported home BP readings were as high as 160-180's .  She was seen in a video/phone visit by Dr. Oval Linsey, at which time her BP was 159/77.  Patient had been to ED in January of this year for hypertensive urgency, with a systolic reading of 240.   Amlodipine was discontinued along the way as it made her feel poorly.  At her visit with Dr. Oval Linsey valsartan 160 mg daily was started.  Today we are talking by phone   Past Medical History: CHF Cath 9735 - severe diastolic HF with possible component of arterial pulmonary hypertension; on torsemide  DM2   cirrhosis   Tobacco abuse      Blood Pressure Goal:  130/80  Current Medications: carvedilol 12.5 mg bid, hydralazine 100 mg tid, valsartan 160 mg qd  Family Hx:  Social Hx:   Diet:   Exercise:   Home BP readings:   Intolerances: lisinopril - cough, losartan - fatigue (per daughter), amlodipine - dizziness/lightheadedness  Labs:  4/23:  Na 139, K 3.7, Glu 272, BUN 39, SCr 1.46, GFR 35   Wt Readings from Last 3 Encounters:  03/14/22 192 lb 1.9 oz (87.1 kg)  03/09/22 198 lb (89.8 kg)  02/10/22 198 lb 9.6 oz (90.1 kg)   BP Readings from Last 3 Encounters:  03/14/22 (!) 115/53  03/14/22 (!) 154/58  03/09/22 (!) 159/77   Pulse Readings from Last 3 Encounters:  03/14/22 73  03/09/22 65  02/10/22 64    Current Outpatient Medications  Medication Sig Dispense Refill   ACCU-CHEK GUIDE test strip CHECK BLOOD GLUCOSE FOUR TIMES DAILY     Accu-Chek Softclix Lancets lancets Check BG 4 times/day.  Dx E11.65     albuterol (PROVENTIL) (2.5 MG/3ML) 0.083% nebulizer solution Take 3 mLs (2.5 mg total) by nebulization every 6 (six) hours as needed for wheezing or shortness of breath. 75 mL 12    aspirin EC 81 MG EC tablet Take 1 tablet (81 mg total) by mouth daily.     carvedilol (COREG) 12.5 MG tablet Take 1 tablet (12.5 mg total) by mouth 2 (two) times daily. 180 tablet 3   docusate sodium (COLACE) 100 MG capsule Take 1 capsule (100 mg total) by mouth 2 (two) times daily. 10 capsule 0   ezetimibe-simvastatin (VYTORIN) 10-20 MG tablet Take 1 tablet by mouth daily. 90 tablet 1   hydrALAZINE (APRESOLINE) 100 MG tablet Take 1 tablet (100 mg total) by mouth 3 (three) times daily. 90 tablet 5   insulin glargine (LANTUS) 100 UNIT/ML injection Inject 0.25 mLs (25 Units total) into the skin at bedtime.     Insulin Glulisine (APIDRA SOLOSTAR) 100 UNIT/ML Solostar Pen Inject 10-15 Units into the skin 3 (three) times daily before meals.      ipratropium-albuterol (DUONEB) 0.5-2.5 (3) MG/3ML SOLN Take 3 mLs by nebulization 3 (three) times daily. 360 mL 3   levothyroxine (SYNTHROID, LEVOTHROID) 25 MCG tablet take 1 tablet by mouth daily (Patient taking differently: Take 25 mcg by mouth. Daily except none on Saturdays and Sundays) 30 tablet 5   LORazepam (ATIVAN) 0.5 MG tablet TAKE 1 TABLET BY MOUTH DAILY AS NEEDED FOR ANXIETY 30 tablet 1   Multiple Vitamins-Minerals (CENTRUM SILVER ULTRA WOMENS PO)  Take 1 tablet by mouth at bedtime.      nystatin cream (MYCOSTATIN) Apply 1 application topically 2 (two) times daily. (Patient not taking: Reported on 03/14/2022) 30 g 3   oxybutynin (DITROPAN) 5 MG tablet TAKE 1 TABLET BY MOUTH DAILY 90 tablet 1   polyethylene glycol (MIRALAX / GLYCOLAX) 17 g packet Take 17 g by mouth daily as needed.     potassium chloride SA (KLOR-CON M) 20 MEQ tablet TAKE 1 TABLET BY MOUTH DAILY AT BEDTIME 30 tablet 11   sucralfate (CARAFATE) 1 g tablet Take 1 tablet (1 g total) by mouth 4 (four) times daily -  with meals and at bedtime. 90 tablet 0   torsemide (DEMADEX) 20 MG tablet Take 2 tablets (8m) by mouth in the morning and 1 tablet (260m in the afternoon. 225 tablet 3    traMADol (ULTRAM) 50 MG tablet Take 1 tablet (50 mg total) by mouth every 6 (six) hours as needed for moderate pain. 60 tablet 1   valsartan (DIOVAN) 160 MG tablet Take 1 tablet (160 mg total) by mouth daily. 90 tablet 1   No current facility-administered medications for this visit.    Allergies  Allergen Reactions   Other Other (See Comments)    Patient has hemorrhaged at least 4 times in her lifetime   Cefuroxime Axetil Nausea Only        Lisinopril Cough   Bactrim [Sulfamethoxazole-Trimethoprim] Other (See Comments)   Ciprofloxacin Nausea Only    Past Medical History:  Diagnosis Date   Asthma    Blood transfusion    Chronic diastolic CHF (congestive heart failure) (HCC)    CKD (chronic kidney disease), stage III (HCC)    Coronary artery calcification seen on CT scan    Diabetes mellitus    Erythropoietin deficiency anemia 09/19/2021   Goiter    Hyperlipidemia    Hypertension    Hypothyroidism    Iron deficiency anemia due to chronic blood loss 09/19/2021   Liver cirrhosis (HCC)    NASH (nonalcoholic steatohepatitis)    Obesity    Osteopenia    Pulmonary hypertension (HCC)    Renal cell carcinoma    a. prior nephrectomy.   Right heart failure (HCC)    Thrombocytopenia (HCC)     There were no vitals taken for this visit.  No problem-specific Assessment & Plan notes found for this encounter.   KrTommy MedalharmD CPP [facility] [address]u[place]courtville[place] [phone]

## 2022-04-27 ENCOUNTER — Telehealth: Payer: Self-pay | Admitting: Family Medicine

## 2022-04-27 NOTE — Telephone Encounter (Signed)
Left message for patient to call back and schedule Medicare Annual Wellness Visit (AWV).   Please offer to do virtually or by telephone.  Left office number and my jabber [phone].  AWVI eligible as of  12/04/2009  Please schedule at anytime with Nurse Health Advisor.

## 2022-05-15 ENCOUNTER — Other Ambulatory Visit: Payer: Medicare Other

## 2022-05-15 ENCOUNTER — Ambulatory Visit: Payer: Medicare Other | Admitting: Family

## 2022-06-27 ENCOUNTER — Ambulatory Visit: Payer: Medicare Other

## 2022-06-28 ENCOUNTER — Encounter: Payer: Self-pay | Admitting: Podiatry

## 2022-06-28 ENCOUNTER — Ambulatory Visit (INDEPENDENT_AMBULATORY_CARE_PROVIDER_SITE_OTHER): Payer: Medicare Other | Admitting: Podiatry

## 2022-06-28 DIAGNOSIS — B351 Tinea unguium: Secondary | ICD-10-CM

## 2022-06-28 DIAGNOSIS — E1165 Type 2 diabetes mellitus with hyperglycemia: Secondary | ICD-10-CM

## 2022-06-28 DIAGNOSIS — M79675 Pain in left toe(s): Secondary | ICD-10-CM | POA: Diagnosis not present

## 2022-06-28 DIAGNOSIS — M79674 Pain in right toe(s): Secondary | ICD-10-CM | POA: Diagnosis not present

## 2022-06-28 NOTE — Progress Notes (Signed)
  Subjective:  Patient ID: Margaret Dyer, female    DOB: 1935/03/13,   MRN: [id number]  No chief complaint on file.   87 y.o. female presents for concern of thickened elongated and painful nails that are difficult to trim. Requesting to have them trimmed today. Relates burning and tingling in their feet. Patient is diabetic and last A1c was  Lab Results  Component Value Date   HGBA1C 6.4 12/13/2021   .   PCP:  Carollee Herter, Alferd Apa, DO    . Denies any other pedal complaints. Denies n/v/f/c.   Past Medical History:  Diagnosis Date   Asthma    Blood transfusion    Chronic diastolic CHF (congestive heart failure) (HCC)    CKD (chronic kidney disease), stage III (HCC)    Coronary artery calcification seen on CT scan    Diabetes mellitus    Erythropoietin deficiency anemia 09/19/2021   Goiter    Hyperlipidemia    Hypertension    Hypothyroidism    Iron deficiency anemia due to chronic blood loss 09/19/2021   Liver cirrhosis (HCC)    NASH (nonalcoholic steatohepatitis)    Obesity    Osteopenia    Pulmonary hypertension (HCC)    Renal cell carcinoma    a. prior nephrectomy.   Right heart failure (HCC)    Thrombocytopenia (HCC)     Objective:  Physical Exam: Vascular: DP/PT pulses 2/4 bilateral. CFT <3 seconds. Absent hair growth on digits. Edema noted to bilateral lower extremities. Xerosis noted bilaterally.  Skin. No lacerations or abrasions bilateral feet. Nails 1-5 bilateral  are thickened discolored and elongated with subungual debris.  Musculoskeletal: MMT 5/5 bilateral lower extremities in DF, PF, Inversion and Eversion. Deceased ROM in DF of ankle joint.  Neurological: Sensation intact to light touch. Protective sensation diminished bilateral.    Assessment:   1. Pain due to onychomycosis of toenails of both feet   2. Uncontrolled type 2 diabetes mellitus with hyperglycemia ([place])      Plan:  Patient was evaluated and treated and all questions  answered. -Discussed and educated patient on diabetic foot care, especially with  regards to the vascular, neurological and musculoskeletal systems.  -Stressed the importance of good glycemic control and the detriment of not  controlling glucose levels in relation to the foot. -Discussed supportive shoes at all times and checking feet regularly.  -Mechanically debrided all nails 1-5 bilateral using sterile nail nipper and filed with dremel without incident  -Answered all patient questions -Patient to return  in 3 months for at risk foot care -Patient advised to call the office if any problems or questions arise in the meantime.   Lorenda Peck, DPM

## 2022-06-30 ENCOUNTER — Other Ambulatory Visit: Payer: Self-pay | Admitting: Family Medicine

## 2022-06-30 ENCOUNTER — Encounter: Payer: Self-pay | Admitting: Family Medicine

## 2022-06-30 ENCOUNTER — Telehealth (INDEPENDENT_AMBULATORY_CARE_PROVIDER_SITE_OTHER): Payer: Medicare Other | Admitting: Family Medicine

## 2022-06-30 DIAGNOSIS — R413 Other amnesia: Secondary | ICD-10-CM

## 2022-06-30 DIAGNOSIS — I6523 Occlusion and stenosis of bilateral carotid arteries: Secondary | ICD-10-CM | POA: Diagnosis not present
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
## 2022-07-03 ENCOUNTER — Other Ambulatory Visit: Payer: Self-pay | Admitting: Family Medicine

## 2022-07-03 DIAGNOSIS — F419 Anxiety disorder, unspecified: Secondary | ICD-10-CM

## 2022-07-03 NOTE — Telephone Encounter (Signed)
Requesting: lorazepam 0.8m  Contract: 01/08/20 UDS: 08/01/2018 Last Visit: 06/30/22 Next Visit: None Last Refill: 03/28/22 #30 and 1RF  Please Advise

## 2022-07-17 ENCOUNTER — Other Ambulatory Visit: Payer: Self-pay

## 2022-07-17 MED ORDER — HYDRALAZINE HCL 100 MG PO TABS
100.0000 mg | ORAL_TABLET | Freq: Three times a day (TID) | ORAL | 5 refills | Status: AC
Start: 2022-07-17 — End: ?

## 2022-07-18 ENCOUNTER — Telehealth: Payer: Self-pay | Admitting: Family Medicine

## 2022-07-18 DIAGNOSIS — E1169 Type 2 diabetes mellitus with other specified complication: Secondary | ICD-10-CM

## 2022-07-18 MED ORDER — EZETIMIBE-SIMVASTATIN 10-20 MG PO TABS: 1.0000 | ORAL_TABLET | Freq: Every day | ORAL | 1 refills | Status: DC

## 2022-07-18 NOTE — Telephone Encounter (Signed)
Refill sent

## 2022-07-18 NOTE — Telephone Encounter (Signed)
Patient called to request refill on her ezetimibe-simvastatin (VYTORIN) 10-20 MG tablet [[id number]]   Patient said [facility] reached out to [place] already  [place], [place], [place] [address]  Phone:  [phone]  Fax:  [phone]   Patient also wanted to find out when she is due for her pneumonia vaccination.

## 2022-07-19 MED ORDER — EZETIMIBE-SIMVASTATIN 10-20 MG PO TABS: 1.0000 | ORAL_TABLET | Freq: Every day | ORAL | 1 refills | Status: DC

## 2022-07-19 NOTE — Addendum Note (Signed)
Addended byDamita Dunnings D on: 07/19/2022 08:13 AM   Modules accepted: Orders

## 2022-07-21 MED ORDER — EZETIMIBE-SIMVASTATIN 10-20 MG PO TABS: 1.0000 | ORAL_TABLET | Freq: Every day | ORAL | 1 refills | Status: AC
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
## 2022-07-21 NOTE — Telephone Encounter (Signed)
Refill sent

## 2022-07-21 NOTE — Addendum Note (Signed)
Addended by: Sanda Linger on: 07/21/2022 02:20 PM   Modules accepted: Orders

## 2022-08-16 ENCOUNTER — Encounter: Payer: Self-pay | Admitting: Podiatry

## 2022-09-01 ENCOUNTER — Telehealth: Payer: Self-pay | Admitting: Family Medicine

## 2022-09-01 NOTE — Telephone Encounter (Signed)
Pt called. LDVM

## 2022-09-01 NOTE — Telephone Encounter (Signed)
Noted. Pt is being triaged

## 2022-09-01 NOTE — Telephone Encounter (Signed)
Pt stated she feels a cold coming on and would like to know what to do to help as we do not have any more appt left. She is congested and slightly SOB. She has not taken a coivd test or used her nebulizer. Transferred to triage for the time being as she was SOB.    [facility] - [place], [place] - [place]   [address] [place]  [address]  Phone:  [phone]  Fax:  [phone]

## 2022-09-01 NOTE — Telephone Encounter (Signed)
Nurse Assessment Nurse: Toribio Harbour, RN, Joelene Millin Date/Time (Eastern Time): 09/01/2022 10:34:29 AM Confirm and document reason for call. If symptomatic, describe symptoms. ---Caller states that she she feels like she is getting a cold. She is short of breath, sneezing, and also having a runny nose. She would like to know what she can take over the counter to help with this. Caller states that she has a nebulizer machine. Symptoms started 2 days ago. No fever. Does the patient have any new or worsening symptoms? ---Yes Will a triage be completed? ---Yes Related visit to physician within the last 2 weeks? ---No Does the PT have any chronic conditions? (i.e. diabetes, asthma, this includes High risk factors for pregnancy, etc.) ---Yes List chronic conditions. ---CHF, diabetes, HTN Is this a behavioral health or substance abuse call? ---No Guidelines Guideline Title Affirmed Question Affirmed Notes Nurse Date/Time (Eastern Time) Cough - Acute NonProductive [1] Patient also has allergy symptoms (e.g., itchy eyes, clear nasal discharge, Daves, RN, Joelene Millin 09/01/2022 10:37:00 AM PLEASE NOTE: All timestamps contained within this report are represented as [place] Standard Time. CONFIDENTIALTY NOTICE: This fax transmission is intended only for the addressee. It contains information that is legally privileged, confidential or otherwise protected from use or disclosure. If you are not the intended recipient, you are strictly prohibited from reviewing, disclosing, copying using or disseminating any of this information or taking any action in reliance on or regarding this information. If you have received this fax in error, please notify us immediately by telephone so that we can arrange for its return to [place]. Phone: [phone], Toll-Free: [phone], Fax: [phone] Page: 2 of 2 Call Id: [id number] Guidelines Guideline Title Affirmed Question Affirmed Notes Nurse Date/Time  Eilene Ghazi Time) postnasal drip) AND [2] they are acting up Disp. Time Eilene Ghazi Time) Disposition Final User 09/01/2022 10:44:46 AM SEE PCP WITHIN 3 DAYS Yes Toribio Harbour, RN, Joelene Millin Final Disposition 09/01/2022 10:44:46 AM SEE PCP WITHIN 3 DAYS Yes Daves, RN, Renea Ee Disagree/Comply Comply Caller Understands Yes PreDisposition Did not know what to do Care Advice Given Per Guideline SEE PCP WITHIN 3 DAYS: * You need to be seen within 2 or 3 days. HUMIDIFIER: * If the air is dry, use a humidifier in the bedroom. CALL BACK IF: * Fever over 103 F (39.4 C) * Difficulty breathing occurs * You become worse CARE ADVICE given per Cough - Acute Non-Productive (Adult) guideline. Referrals REFERRED TO PCP OFFICE

## 2022-09-04 ENCOUNTER — Ambulatory Visit (INDEPENDENT_AMBULATORY_CARE_PROVIDER_SITE_OTHER): Payer: Medicare Other | Admitting: Diagnostic Neuroimaging

## 2022-09-04 ENCOUNTER — Encounter: Payer: Self-pay | Admitting: Family

## 2022-09-04 ENCOUNTER — Encounter: Payer: Self-pay | Admitting: Diagnostic Neuroimaging

## 2022-09-04 VITALS — BP 155/59 | HR 60 | Ht 63.5 in | Wt 189.2 lb

## 2022-09-04 DIAGNOSIS — I6523 Occlusion and stenosis of bilateral carotid arteries: Secondary | ICD-10-CM | POA: Diagnosis not present

## 2022-09-04 DIAGNOSIS — G3184 Mild cognitive impairment, so stated: Secondary | ICD-10-CM | POA: Diagnosis not present

## 2022-09-04 NOTE — Progress Notes (Signed)
[facility]  PATIENT: Margaret Dyer DOB: [birth year]  REFERRING CLINICIAN: Ann Held, * HISTORY FROM: patient REASON FOR VISIT: new consult   HISTORICAL  CHIEF COMPLAINT:  Chief Complaint  Patient presents with   Memory Loss    Rm 6 New Pt TOC Dr Jannifer Franklin, MMSE 26 "short term memory is getting worse; I don't drive anymore; lack of concentration and forgetfulness have gotten worse"     HISTORY OF PRESENT ILLNESS:   87 year old female here for evaluation of memory loss.  Patient reports some mild short-term memory loss issues without major changes in ADLs.  She lives with her daughter.  Daughter notices more problems with patient.  Patient is alone for the visit today.  She able to maintain all of her ADLs including bathing, dressing, feeding, handling medications and finances.  She no longer drives.  She has some mild hearing loss and mild cataracts.  He has some limitations due to congestive heart failure and O2 dependence.   REVIEW OF SYSTEMS: Full 14 system review of systems performed and negative with exception of: as per HPI.  ALLERGIES: Allergies  Allergen Reactions   Other Other (See Comments)    Patient has hemorrhaged at least 4 times in her lifetime   Cefuroxime Axetil Nausea Only        Lisinopril Cough   Bactrim [Sulfamethoxazole-Trimethoprim] Other (See Comments)   Ciprofloxacin Nausea Only    HOME MEDICATIONS: Outpatient Medications Prior to Visit  Medication Sig Dispense Refill   ACCU-CHEK GUIDE test strip CHECK BLOOD GLUCOSE FOUR TIMES DAILY     Accu-Chek Softclix Lancets lancets Check BG 4 times/day.  Dx E11.65     albuterol (PROVENTIL) (2.5 MG/3ML) 0.083% nebulizer solution Take 3 mLs (2.5 mg total) by nebulization every 6 (six) hours as needed for wheezing or shortness of breath. 75 mL 12   aspirin EC 81 MG EC tablet Take 1 tablet (81 mg total) by mouth daily.     carvedilol (COREG) 12.5 MG tablet Take 1 tablet (12.5 mg  total) by mouth 2 (two) times daily. 180 tablet 3   docusate sodium (COLACE) 100 MG capsule Take 1 capsule (100 mg total) by mouth 2 (two) times daily. 10 capsule 0   ezetimibe-simvastatin (VYTORIN) 10-20 MG tablet Take 1 tablet by mouth daily. 90 tablet 1   hydrALAZINE (APRESOLINE) 100 MG tablet Take 1 tablet (100 mg total) by mouth 3 (three) times daily. 90 tablet 5   insulin glargine (LANTUS) 100 UNIT/ML injection Inject 0.25 mLs (25 Units total) into the skin at bedtime.     Insulin Glulisine (APIDRA SOLOSTAR) 100 UNIT/ML Solostar Pen Inject 10-15 Units into the skin 3 (three) times daily before meals.      ipratropium-albuterol (DUONEB) 0.5-2.5 (3) MG/3ML SOLN Take 3 mLs by nebulization 3 (three) times daily. 360 mL 3   levothyroxine (SYNTHROID, LEVOTHROID) 25 MCG tablet take 1 tablet by mouth daily (Patient taking differently: Take 25 mcg by mouth. Daily except none on Saturdays and Sundays) 30 tablet 5   LORazepam (ATIVAN) 0.5 MG tablet TAKE 1 TABLET BY MOUTH DAILY AS NEEDED FOR ANXIETY 30 tablet 1   Multiple Vitamins-Minerals (CENTRUM SILVER ULTRA WOMENS PO) Take 1 tablet by mouth at bedtime.      nystatin cream (MYCOSTATIN) Apply 1 application topically 2 (two) times daily. 30 g 3   oxybutynin (DITROPAN) 5 MG tablet TAKE 1 TABLET BY MOUTH DAILY 90 tablet 1   polyethylene glycol (MIRALAX / GLYCOLAX) 17  g packet Take 17 g by mouth daily as needed.     potassium chloride SA (KLOR-CON M) 20 MEQ tablet TAKE 1 TABLET BY MOUTH DAILY AT BEDTIME 30 tablet 11   sucralfate (CARAFATE) 1 g tablet Take 1 tablet (1 g total) by mouth 4 (four) times daily -  with meals and at bedtime. 90 tablet 0   torsemide (DEMADEX) 20 MG tablet Take 2 tablets (18m) by mouth in the morning and 1 tablet (234m in the afternoon. 225 tablet 3   traMADol (ULTRAM) 50 MG tablet Take 1 tablet (50 mg total) by mouth every 6 (six) hours as needed for moderate pain. 60 tablet 1   valsartan (DIOVAN) 160 MG tablet Take 1 tablet (160  mg total) by mouth daily. 90 tablet 1   No facility-administered medications prior to visit.      PHYSICAL EXAM  GENERAL EXAM/CONSTITUTIONAL: Vitals:  Vitals:   09/04/22 1446  BP: (!) 155/59  Pulse: 60  Weight: 189 lb 3.2 oz (85.8 kg)  Height: 5' 3.5" (1.613 m)   Body mass index is 32.99 kg/m. Wt Readings from Last 3 Encounters:  09/04/22 189 lb 3.2 oz (85.8 kg)  03/14/22 192 lb 1.9 oz (87.1 kg)  03/09/22 198 lb (89.8 kg)   Patient is in no distress; well developed, nourished and groomed; neck is supple  CARDIOVASCULAR: Examination of carotid arteries is normal; no carotid bruits Regular rate and rhythm, no murmurs Examination of peripheral vascular system by observation and palpation is normal  EYES: Ophthalmoscopic exam of optic discs and posterior segments is normal; no papilledema or hemorrhages No results found.  MUSCULOSKELETAL: Gait, strength, tone, movements noted in Neurologic exam below  NEUROLOGIC: MENTAL STATUS:     09/04/2022    2:51 PM 05/30/2018    2:25 PM  MMSE - Mini Mental State Exam  Orientation to time 5 5  Orientation to Place 5 5  Registration 3 3  Attention/ Calculation 2 5  Recall 2 3  Language- name 2 objects 2 2  Language- repeat 1 1  Language- follow 3 step command 3 3  Language- read & follow direction 1 1  Write a sentence 1 1  Copy design 1 1  Total score 26 30   awake, alert, oriented to person, place and time recent and remote memory intact normal attention and concentration language fluent, comprehension intact, naming intact fund of knowledge appropriate  CRANIAL NERVE:  2nd - no papilledema on fundoscopic exam 2nd, 3rd, 4th, 6th - pupils equal and reactive to light, visual fields full to confrontation, extraocular muscles intact, no nystagmus 5th - facial sensation symmetric 7th - facial strength symmetric 8th - hearing intact 9th - palate elevates symmetrically, uvula midline 11th - shoulder shrug  symmetric 12th - tongue protrusion midline  MOTOR:  normal bulk and tone, full strength in the BUE, BLE  SENSORY:  normal and symmetric to light touch, temperature, vibration  COORDINATION:  finger-nose-finger, fine finger movements normal  REFLEXES:  deep tendon reflexes TRACE and symmetric  GAIT/STATION:  narrow based gait USING WALKER     DIAGNOSTIC DATA (LABS, IMAGING, TESTING) - I reviewed patient records, labs, notes, testing and imaging myself where available.  Lab Results  Component Value Date   WBC 4.7 03/14/2022   HGB 10.0 (L) 03/14/2022   HCT 30.8 (L) 03/14/2022   MCV 93.9 03/14/2022   PLT 86 (L) 03/14/2022      Component Value Date/Time   NA 139 03/14/2022 1304  NA 138 07/20/2021 1622   K 3.7 03/14/2022 1304   CL 99 03/14/2022 1304   CO2 31 03/14/2022 1304   GLUCOSE 272 (H) 03/14/2022 1304   BUN 39 (H) 03/14/2022 1304   BUN 32 (H) 07/20/2021 1622   CREATININE 1.46 (H) 03/14/2022 1304   CREATININE 1.06 (H) 11/30/2017 1559   CALCIUM 9.2 03/14/2022 1304   PROT 6.9 03/14/2022 1304   PROT 6.5 07/20/2021 1622   ALBUMIN 3.7 03/14/2022 1304   ALBUMIN 4.1 07/20/2021 1622   AST 23 03/14/2022 1304   ALT 13 03/14/2022 1304   ALKPHOS 42 03/14/2022 1304   BILITOT 0.6 03/14/2022 1304   GFRNONAA 35 (L) 03/14/2022 1304   GFRAA 45 (L) 11/08/2020 1638   Lab Results  Component Value Date   CHOL 202 (H) 12/13/2021   HDL 44.50 12/13/2021   LDLCALC 121 (H) 12/13/2021   LDLDIRECT 89.0 01/19/2015   TRIG 186.0 (H) 12/13/2021   CHOLHDL 5 12/13/2021   Lab Results  Component Value Date   HGBA1C 6.4 12/13/2021   Lab Results  Component Value Date   VITAMINB12 359 05/30/2018   Lab Results  Component Value Date   TSH 0.94 12/13/2021    03/30/21 MRI brain 1. No acute intracranial infarct or evidence for PRES. No other acute intracranial abnormality. 2. Moderately advanced cerebral atrophy with mild chronic small vessel ischemic disease, with superimposed  remote lacunar infarcts involving the left thalamus and central pons.    ASSESSMENT AND PLAN  87 y.o. year old female here with:   Dx:  1. MCI (mild cognitive impairment)      PLAN:  Mild cognitive impairment (also mild hearing loss; also mild cataracts) - MMSE 26/30; no major changes in ADLs from memory loss; some physical limitation; no sign of dementia - safety / supervision issues reviewed - daily physical activity / exercise (at least 15-30 minutes) - eat more plants / vegetables - increase social activities, brain stimulation, games, puzzles, hobbies, crafts, arts, music - aim for at least 7-8 hours sleep per night (or more) - avoid smoking and alcohol - caution with medications, finances; no driving  Return for pending if symptoms worsen or fail to improve, return to PCP.    Penni Bombard, MD 78/08/7846, 8:41 PM Certified in Neurology, Neurophysiology and Neuroimaging  [facility] [address] [place], [place] [address] [phone]

## 2022-09-04 NOTE — Patient Instructions (Addendum)
  Mild cognitive impairment (also mild hearing loss; also mild cataracts) - MMSE 26/30; no major changes in ADLs from memory loss; some physical limitations; no sign of dementia - safety / supervision issues reviewed - daily physical activity / exercise (at least 15-30 minutes) - eat more plants / vegetables - increase social activities, brain stimulation, games, puzzles, hobbies, crafts, arts, music - aim for at least 7-8 hours sleep per night (or more) - avoid smoking and alcohol - caution with medications, finances; no driving

## 2022-09-05 ENCOUNTER — Other Ambulatory Visit: Payer: Self-pay | Admitting: Cardiovascular Disease

## 2022-09-05 NOTE — Telephone Encounter (Signed)
Rx request sent to pharmacy.  

## 2022-09-19 ENCOUNTER — Other Ambulatory Visit: Payer: Self-pay | Admitting: Family Medicine

## 2022-09-19 DIAGNOSIS — M79605 Pain in left leg: Secondary | ICD-10-CM

## 2022-09-19 DIAGNOSIS — G894 Chronic pain syndrome: Secondary | ICD-10-CM

## 2022-09-19 NOTE — Telephone Encounter (Signed)
Requesting: tramadol 68m  Contract: 08/01/2018 UDS: 08/01/2018 Last Visit: 06/30/22 Next Visit: None Last Refill: 06/09/21 #60 and 1RF  Please Advise

## 2022-09-27 ENCOUNTER — Ambulatory Visit: Payer: Medicare Other | Admitting: Podiatry

## 2022-10-03 DIAGNOSIS — M545 Low back pain, unspecified: Secondary | ICD-10-CM | POA: Diagnosis not present

## 2022-10-05 DIAGNOSIS — Z794 Long term (current) use of insulin: Secondary | ICD-10-CM | POA: Diagnosis not present

## 2022-10-05 DIAGNOSIS — E1122 Type 2 diabetes mellitus with diabetic chronic kidney disease: Secondary | ICD-10-CM | POA: Diagnosis not present

## 2022-10-05 DIAGNOSIS — N1832 Chronic kidney disease, stage 3b: Secondary | ICD-10-CM | POA: Diagnosis not present

## 2022-10-05 DIAGNOSIS — E039 Hypothyroidism, unspecified: Secondary | ICD-10-CM | POA: Diagnosis not present

## 2022-10-12 ENCOUNTER — Emergency Department (HOSPITAL_COMMUNITY)
Admission: EM | Admit: 2022-10-12 | Discharge: 2022-10-13 | Disposition: A | Discharge status: Home / Self Care | Payer: Medicare Other | Attending: Emergency Medicine | Admitting: Emergency Medicine

## 2022-10-12 DIAGNOSIS — T68XXXA Hypothermia, initial encounter: Secondary | ICD-10-CM | POA: Diagnosis not present

## 2022-10-12 DIAGNOSIS — Z794 Long term (current) use of insulin: Secondary | ICD-10-CM | POA: Insufficient documentation

## 2022-10-12 DIAGNOSIS — Z7982 Long term (current) use of aspirin: Secondary | ICD-10-CM | POA: Diagnosis not present

## 2022-10-12 DIAGNOSIS — R0689 Other abnormalities of breathing: Secondary | ICD-10-CM | POA: Diagnosis not present

## 2022-10-12 DIAGNOSIS — E039 Hypothyroidism, unspecified: Secondary | ICD-10-CM | POA: Insufficient documentation

## 2022-10-12 DIAGNOSIS — M47816 Spondylosis without myelopathy or radiculopathy, lumbar region: Secondary | ICD-10-CM | POA: Diagnosis not present

## 2022-10-12 DIAGNOSIS — M545 Low back pain, unspecified: Secondary | ICD-10-CM | POA: Insufficient documentation

## 2022-10-12 DIAGNOSIS — M4316 Spondylolisthesis, lumbar region: Secondary | ICD-10-CM | POA: Diagnosis not present

## 2022-10-12 DIAGNOSIS — I959 Hypotension, unspecified: Secondary | ICD-10-CM | POA: Diagnosis not present

## 2022-10-12 DIAGNOSIS — R531 Weakness: Secondary | ICD-10-CM | POA: Diagnosis present

## 2022-10-12 DIAGNOSIS — Z7951 Long term (current) use of inhaled steroids: Secondary | ICD-10-CM | POA: Diagnosis not present

## 2022-10-12 DIAGNOSIS — J45909 Unspecified asthma, uncomplicated: Secondary | ICD-10-CM | POA: Diagnosis not present

## 2022-10-12 DIAGNOSIS — I130 Hypertensive heart and chronic kidney disease with heart failure and stage 1 through stage 4 chronic kidney disease, or unspecified chronic kidney disease: Secondary | ICD-10-CM | POA: Diagnosis not present

## 2022-10-12 DIAGNOSIS — E860 Dehydration: Secondary | ICD-10-CM

## 2022-10-12 DIAGNOSIS — I5032 Chronic diastolic (congestive) heart failure: Secondary | ICD-10-CM | POA: Diagnosis not present

## 2022-10-12 DIAGNOSIS — Z85528 Personal history of other malignant neoplasm of kidney: Secondary | ICD-10-CM | POA: Diagnosis not present

## 2022-10-12 DIAGNOSIS — N183 Chronic kidney disease, stage 3 unspecified: Secondary | ICD-10-CM | POA: Insufficient documentation

## 2022-10-12 DIAGNOSIS — N179 Acute kidney failure, unspecified: Secondary | ICD-10-CM | POA: Diagnosis not present

## 2022-10-12 DIAGNOSIS — Z79899 Other long term (current) drug therapy: Secondary | ICD-10-CM | POA: Diagnosis not present

## 2022-10-12 DIAGNOSIS — E1122 Type 2 diabetes mellitus with diabetic chronic kidney disease: Secondary | ICD-10-CM | POA: Diagnosis not present

## 2022-10-12 DIAGNOSIS — R001 Bradycardia, unspecified: Secondary | ICD-10-CM | POA: Diagnosis not present

## 2022-10-12 MED ORDER — ONDANSETRON HCL 4 MG/2ML IJ SOLN
4.0000 mg | Freq: Once | INTRAMUSCULAR | Status: DC
Start: 2022-10-12 — End: 2022-10-13
  Filled 2022-10-12: qty 2

## 2022-10-12 NOTE — ED Provider Notes (Signed)
[facility] EMERGENCY DEPARTMENT Provider Note   CSN: [id number] Arrival date & time: 10/12/22  2328     History  Chief Complaint  Patient presents with   Weakness    Margaret Dyer is a 87 y.o. female.  HPI     This is an 87 year old female with a history of diabetes, hypertension, heart failure who presents with generalized weakness.  Patient reports that Margaret Dyer has been treated for UTI.  Margaret Dyer was initially on Macrobid but states that Margaret Dyer did not tolerate this medication and that it made her sick.  Margaret Dyer was switched to cephalexin.  Margaret Dyer states Margaret Dyer has had persistent nausea and anorexia.  Margaret Dyer states "I just do not feel good."  Additionally, Margaret Dyer states that 2 weeks ago Margaret Dyer fell in her bathtub.  Margaret Dyer reports that Margaret Dyer has had progressively worsening low back pain.  It is worse with movements.  No weakness or numbness of the lower extremities.  Margaret Dyer has not had any fevers.  Home Medications Prior to Admission medications   Medication Sig Start Date End Date Taking? Authorizing Provider  albuterol (PROVENTIL) (2.5 MG/3ML) 0.083% nebulizer solution Take 3 mLs (2.5 mg total) by nebulization every 6 (six) hours as needed for wheezing or shortness of breath. 06/09/21  Yes Ann Held, DO  aspirin EC 81 MG EC tablet Take 1 tablet (81 mg total) by mouth daily. 07/07/19  Yes Vann, Jessica U, DO  carvedilol (COREG) 12.5 MG tablet Take 1 tablet (12.5 mg total) by mouth 2 (two) times daily. 01/26/22 10/14/23 Yes Hilty, Nadean Corwin, MD  cefdinir (OMNICEF) 300 MG capsule Take 300 mg by mouth 2 (two) times daily.   Yes [provider]  docusate sodium (COLACE) 100 MG capsule Take 1 capsule (100 mg total) by mouth 2 (two) times daily. 12/30/18  Yes Bonnell Public, MD  ezetimibe-simvastatin (VYTORIN) 10-20 MG tablet Take 1 tablet by mouth daily. 07/21/22  Yes Roma Schanz R, DO  hydrALAZINE (APRESOLINE) 100 MG tablet Take 1 tablet (100 mg total) by mouth 3 (three) times  daily. 07/17/22  Yes Skeet Latch, MD  insulin glargine (LANTUS) 100 UNIT/ML injection Inject 0.25 mLs (25 Units total) into the skin at bedtime. Patient taking differently: Inject 10 Units into the skin at bedtime. 07/06/19  Yes Geradine Girt, DO  Insulin Glulisine (APIDRA SOLOSTAR) 100 UNIT/ML Solostar Pen Inject 10-15 Units into the skin 3 (three) times daily before meals.    Yes [provider]  levothyroxine (SYNTHROID, LEVOTHROID) 25 MCG tablet take 1 tablet by mouth daily Patient taking differently: Take 25 mcg by mouth. Daily except none on Saturdays and Sundays 07/13/17  Yes Lowne Lyndal Pulley R, DO  LORazepam (ATIVAN) 0.5 MG tablet TAKE 1 TABLET BY MOUTH DAILY AS NEEDED FOR ANXIETY Patient taking differently: Take 0.5 mg by mouth every other day. 07/03/22  Yes Ann Held, DO  Multiple Vitamins-Minerals (CENTRUM SILVER ULTRA WOMENS PO) Take 1 tablet by mouth at bedtime.    Yes [provider]  nystatin cream (MYCOSTATIN) Apply 1 application topically 2 (two) times daily. Patient taking differently: Apply 1 application  topically daily as needed (for yeast infection). 12/13/21  Yes Roma Schanz R, DO  oxybutynin (DITROPAN) 5 MG tablet TAKE 1 TABLET BY MOUTH DAILY Patient taking differently: Take 5 mg by mouth daily. 04/19/22  Yes Roma Schanz R, DO  polyethylene glycol (MIRALAX / GLYCOLAX) 17 g packet Take 17 g by mouth  3 (three) times a week.   Yes [provider]  potassium chloride SA (KLOR-CON M) 20 MEQ tablet TAKE 1 TABLET BY MOUTH DAILY AT BEDTIME Patient taking differently: Take 20 mEq by mouth at bedtime. 03/21/22  Yes Hilty, Nadean Corwin, MD  sucralfate (CARAFATE) 1 g tablet Take 1 tablet (1 g total) by mouth 4 (four) times daily -  with meals and at bedtime. Patient taking differently: Take 1 g by mouth daily as needed. 12/02/19  Yes Roma Schanz R, DO  torsemide (DEMADEX) 20 MG tablet Take 2 tablets (55m) by mouth in the  morning and 1 tablet (235m in the afternoon. Patient taking differently: Take 40 mg by mouth daily. . Marland Kitchen1/28/22  Yes Hilty, KeNadean CorwinMD  traMADol (ULTRAM) 50 MG tablet TAKE 1 TABLET BY MOUTH EVERY 6 HOURS AS NEEDED FOR MODERATE PAIN Patient taking differently: Take 50 mg by mouth daily as needed for moderate pain. 09/20/22  Yes LoRoma Schanz, DO  valsartan (DIOVAN) 160 MG tablet TAKE 1 TABLET BY MOUTH DAILY 09/05/22  Yes RaSkeet LatchMD  ACCU-CHEK GUIDE test strip CHECK BLOOD GLUCOSE FOUR TIMES DAILY 08/05/21   [provider]  Accu-Chek Softclix Lancets lancets Check BG 4 times/day.  Dx E11.65 08/03/21   [provider]  ipratropium-albuterol (DUONEB) 0.5-2.5 (3) MG/3ML SOLN Take 3 mLs by nebulization 3 (three) times daily. 12/20/21   ElMargaretha SeedsMD      Allergies    Other, Cefuroxime axetil, Lisinopril, Bactrim [sulfamethoxazole-trimethoprim], and Ciprofloxacin    Review of Systems   Review of Systems  Constitutional:  Negative for fever.  Gastrointestinal:  Positive for nausea. Negative for abdominal pain and vomiting.  Genitourinary:  Negative for dysuria.  Musculoskeletal:  Positive for back pain.  All other systems reviewed and are negative.   Physical Exam Updated Vital Signs BP (!) 145/49 (BP Location: Right Arm)   Pulse 62   Temp 97.9 F (36.6 C) (Oral)   Resp 18   LMP  (LMP Unknown)   SpO2 98%  Physical Exam Vitals and nursing note reviewed.  Constitutional:      Appearance: Margaret Dyer is well-developed. Margaret Dyer is obese. Margaret Dyer is not ill-appearing.  HENT:     Head: Normocephalic and atraumatic.     Mouth/Throat:     Mouth: Mucous membranes are moist.  Eyes:     Pupils: Pupils are equal, round, and reactive to light.  Cardiovascular:     Rate and Rhythm: Normal rate and regular rhythm.     Heart sounds: Normal heart sounds.  Pulmonary:     Effort: Pulmonary effort is normal. No respiratory distress.     Breath sounds: No wheezing.   Abdominal:     Palpations: Abdomen is soft.     Tenderness: There is no abdominal tenderness. There is no guarding or rebound.  Musculoskeletal:     Cervical back: Neck supple.     Comments: Tenderness to palpation lower lumbar spine, no step-off or deformity  Skin:    General: Skin is warm and dry.     Comments: Ecchymosis bilateral arms  Neurological:     Mental Status: Margaret Dyer is alert and oriented to person, place, and time.  Psychiatric:        Mood and Affect: Mood normal.     ED Results / Procedures / Treatments   Labs (all labs ordered are listed, but only abnormal results are displayed) Labs Reviewed  CBC WITH DIFFERENTIAL/PLATELET - Abnormal; Notable for  the following components:      Result Value   Platelets 96 (*)    All other components within normal limits  URINALYSIS, ROUTINE W REFLEX MICROSCOPIC - Abnormal; Notable for the following components:   Glucose, UA 50 (*)    All other components within normal limits  COMPREHENSIVE METABOLIC PANEL - Abnormal; Notable for the following components:   Chloride 94 (*)    Glucose, Bld 245 (*)    BUN 72 (*)    Creatinine, Ser 2.44 (*)    Total Protein 6.4 (*)    Albumin 3.2 (*)    GFR, Estimated 19 (*)    All other components within normal limits  CBG MONITORING, ED - Abnormal; Notable for the following components:   Glucose-Capillary 215 (*)    All other components within normal limits  URINE CULTURE  LIPASE, BLOOD    EKG EKG Interpretation  Date/Time:  Friday October 13 2022 01:16:13 EST Ventricular Rate:  64 PR Interval:  180 QRS Duration: 110 QT Interval:  452 QTC Calculation: 467 R Axis:   12 Text Interpretation: Sinus rhythm Multiple premature complexes, vent & supraven Nonspecific T abnormalities, lateral leads Confirmed by Thayer Jew [id number]) on 10/13/2022 1:57:03 AM  Radiology DG Lumbar Spine Complete  Result Date: 10/13/2022 CLINICAL DATA:  Fall in bathtub with lower back pain EXAM: LUMBAR  SPINE - COMPLETE 4+ VIEW COMPARISON:  Radiographs 09/27/2009 FINDINGS: Demineralization. Grade 1 anterolisthesis of L4 is new since 2010 but is presumed chronic. Vertebral body height is maintained. Mild multilevel spondylosis. Mild disc space height loss at L3-L4 and L4-L5. Moderate lower lumbar facet arthropathy. Calcified splenic artery and aorta.  Surgical clips in the abdomen. IMPRESSION: Demineralization.  No definite acute fracture. Grade 1 anterolisthesis of L4 on L5. Electronically Signed   By: Placido Sou M.D.   On: 10/13/2022 00:57    Procedures Procedures    Medications Ordered in ED Medications  sodium chloride 0.9 % bolus 1,000 mL (0 mLs Intravenous Stopped 10/13/22 0406)  traMADol (ULTRAM) tablet 50 mg (50 mg Oral Given 10/13/22 0215)  acetaminophen (TYLENOL) tablet 1,000 mg (1,000 mg Oral Given 10/13/22 0544)    ED Course/ Medical Decision Making/ A&P                           Medical Decision Making Amount and/or Complexity of Data Reviewed Labs: ordered. Radiology: ordered.  Risk OTC drugs. Prescription drug management.   This patient presents to the ED for concern of generalized weakness, back pain, this involves an extensive number of treatment options, and is a complaint that carries with it a high risk of complications and morbidity.  I considered the following differential and admission for this acute, potentially life threatening condition.  The differential diagnosis includes systemic illness such as UTI or viral illness, traumatic injury related to recent fall, stroke  MDM:    This is a 87 year old female who presents with nausea and generalized weakness.  Margaret Dyer states Margaret Dyer fell several weeks ago and has been having back pain since.  Margaret Dyer has seen orthopedics and had negative x-rays.  Denies signs or symptoms of cauda equina.  Recently had started antibiotics for UTI.  Margaret Dyer did not respond well to [place].  Margaret Dyer is currently on cephalexin.  Margaret Dyer has not had any  fevers.  Labs obtained.  No significant leukocytosis.  Margaret Dyer does have a mild AKI with creatinine of 2.4.  Baseline 1.4-1.5.  Margaret Dyer was given  fluids.  Margaret Dyer does report recent decreased p.o. intake.  Urinalysis is not consistent with UTI.  Plain films lumbar spine are negative.  Discussed the results with the patient.  Feel her AKI is likely related to dehydration.  Margaret Dyer will need to recheck.  Margaret Dyer was able to ambulate at her baseline with a walker.  Regarding her back pain, recommend follow-up with orthopedics.  Margaret Dyer can continue tramadol.  Will discharge with a prescription for Zofran.  (Labs, imaging, consults)  Labs: I Ordered, and personally interpreted labs.  The pertinent results include: CBC, CMP, lipase, urinalysis  Imaging Studies ordered: I ordered imaging studies including lumbar films I independently visualized and interpreted imaging. I agree with the radiologist interpretation  Additional history obtained from chart review.  External records from outside source obtained and reviewed including orthopedic notes  Cardiac Monitoring: The patient was maintained on a cardiac monitor.  I personally viewed and interpreted the cardiac monitored which showed an underlying rhythm of: Sinus rhythm  Reevaluation: After the interventions noted above, I reevaluated the patient and found that they have :improved  Social Determinants of Health:  elderly, lives independently  Disposition: Discharge  Co morbidities that complicate the patient evaluation  Past Medical History:  Diagnosis Date   Asthma    Blood transfusion    Chronic diastolic CHF (congestive heart failure) (HCC)    CKD (chronic kidney disease), stage III ([place])    Coronary artery calcification seen on CT scan    Diabetes mellitus    Erythropoietin deficiency anemia 09/19/2021   Goiter    Hyperlipidemia    Hypertension    Hypothyroidism    Iron deficiency anemia due to chronic blood loss 09/19/2021   Liver cirrhosis (HCC)     NASH (nonalcoholic steatohepatitis)    Obesity    Osteopenia    Pulmonary hypertension ([place])    Renal cell carcinoma    a. prior nephrectomy.   Right heart failure (HCC)    Thrombocytopenia (HCC)      Medicines Meds ordered this encounter  Medications   DISCONTD: ondansetron (ZOFRAN) injection 4 mg   sodium chloride 0.9 % bolus 1,000 mL   traMADol (ULTRAM) tablet 50 mg   acetaminophen (TYLENOL) tablet 1,000 mg    I have reviewed the patients home medicines and have made adjustments as needed  Problem List / ED Course: Problem List Items Addressed This Visit       Other   Back pain   Other Visit Diagnoses     Dehydration    -  Primary   AKI (acute kidney injury) ([place])                       Final Clinical Impression(s) / ED Diagnoses Final diagnoses:  Dehydration  AKI (acute kidney injury) ([place])  Acute midline low back pain without sciatica    Rx / DC Orders ED Discharge Orders     None         Merryl Hacker, MD 10/14/22 [phone]

## 2022-10-12 NOTE — ED Triage Notes (Signed)
BIB EMS for nausea and not feeling since 5 days ago. Is being treated for for a UTI. Hx falls  Received 55m Zofran en route

## 2022-10-13 ENCOUNTER — Emergency Department (HOSPITAL_COMMUNITY): Payer: Medicare Other

## 2022-10-13 ENCOUNTER — Other Ambulatory Visit: Payer: Self-pay

## 2022-10-13 ENCOUNTER — Encounter (HOSPITAL_COMMUNITY): Payer: Self-pay

## 2022-10-13 DIAGNOSIS — E860 Dehydration: Secondary | ICD-10-CM | POA: Diagnosis not present

## 2022-10-13 DIAGNOSIS — M47816 Spondylosis without myelopathy or radiculopathy, lumbar region: Secondary | ICD-10-CM | POA: Diagnosis not present

## 2022-10-13 DIAGNOSIS — M549 Dorsalgia, unspecified: Secondary | ICD-10-CM | POA: Diagnosis not present

## 2022-10-13 DIAGNOSIS — R531 Weakness: Secondary | ICD-10-CM | POA: Diagnosis not present

## 2022-10-13 DIAGNOSIS — M4316 Spondylolisthesis, lumbar region: Secondary | ICD-10-CM | POA: Diagnosis not present

## 2022-10-13 DIAGNOSIS — I959 Hypotension, unspecified: Secondary | ICD-10-CM | POA: Diagnosis not present

## 2022-10-13 DIAGNOSIS — Z7401 Bed confinement status: Secondary | ICD-10-CM | POA: Diagnosis not present

## 2022-10-13 DIAGNOSIS — M545 Low back pain, unspecified: Secondary | ICD-10-CM | POA: Diagnosis not present

## 2022-10-13 LAB — COMPREHENSIVE METABOLIC PANEL
ALT: 9 U/L (ref 0–44)
AST: 30 U/L (ref 15–41)
Albumin: 3.2 g/dL — ABNORMAL LOW (ref 3.5–5.0)
Alkaline Phosphatase: 78 U/L (ref 38–126)
Anion gap: 12 (ref 5–15)
BUN: 72 mg/dL — ABNORMAL HIGH (ref 8–23)
CO2: 30 mmol/L (ref 22–32)
Calcium: 8.9 mg/dL (ref 8.9–10.3)
Chloride: 94 mmol/L — ABNORMAL LOW (ref 98–111)
Creatinine, Ser: 2.44 mg/dL — ABNORMAL HIGH (ref 0.44–1.00)
GFR, Estimated: 19 mL/min — ABNORMAL LOW (ref >60)
Glucose, Bld: 245 mg/dL — ABNORMAL HIGH (ref 70–99)
Potassium: 3.5 mmol/L (ref 3.5–5.1)
Sodium: 136 mmol/L (ref 135–145)
Total Bilirubin: 0.9 mg/dL (ref 0.3–1.2)
Total Protein: 6.4 g/dL — ABNORMAL LOW (ref 6.5–8.1)

## 2022-10-13 LAB — URINALYSIS, ROUTINE W REFLEX MICROSCOPIC
Bilirubin Urine: NEGATIVE
Glucose, UA: 50 mg/dL — AB
Hgb urine dipstick: NEGATIVE
Ketones, ur: NEGATIVE mg/dL
Leukocytes,Ua: NEGATIVE
Nitrite: NEGATIVE
Protein, ur: NEGATIVE mg/dL
Specific Gravity, Urine: 1.009 (ref 1.005–1.030)
pH: 6 (ref 5.0–8.0)

## 2022-10-13 LAB — CBC WITH DIFFERENTIAL/PLATELET
Abs Immature Granulocytes: 0.05 10*3/uL (ref 0.00–0.07)
Basophils Absolute: 0.1 10*3/uL (ref 0.0–0.1)
Basophils Relative: 1 %
Eosinophils Absolute: 0.2 10*3/uL (ref 0.0–0.5)
Eosinophils Relative: 3 %
HCT: 39 % (ref 36.0–46.0)
Hemoglobin: 12.2 g/dL (ref 12.0–15.0)
Immature Granulocytes: 1 %
Lymphocytes Relative: 10 %
Lymphs Abs: 0.7 10*3/uL (ref 0.7–4.0)
MCH: 29.3 pg (ref 26.0–34.0)
MCHC: 31.3 g/dL (ref 30.0–36.0)
MCV: 93.5 fL (ref 80.0–100.0)
Monocytes Absolute: 1 10*3/uL (ref 0.1–1.0)
Monocytes Relative: 13 %
Neutro Abs: 5.3 10*3/uL (ref 1.7–7.7)
Neutrophils Relative %: 72 %
Platelets: 96 10*3/uL — ABNORMAL LOW (ref 150–400)
RBC: 4.17 MIL/uL (ref 3.87–5.11)
RDW: 14.2 % (ref 11.5–15.5)
WBC: 7.4 10*3/uL (ref 4.0–10.5)
nRBC: 0 % (ref 0.0–0.2)

## 2022-10-13 LAB — LIPASE, BLOOD: Lipase: 41 U/L (ref 11–51)

## 2022-10-13 LAB — CBG MONITORING, ED: Glucose-Capillary: 215 mg/dL — ABNORMAL HIGH (ref 70–99)

## 2022-10-13 MED ORDER — ACETAMINOPHEN 500 MG PO TABS
1000.0000 mg | ORAL_TABLET | Freq: Once | ORAL | Status: AC
Start: 2022-10-13 — End: 2022-10-13
  Administered 2022-10-13: 1000 mg via ORAL
  Filled 2022-10-13: qty 2

## 2022-10-13 MED ORDER — SODIUM CHLORIDE 0.9 % IV BOLUS
1000.0000 mL | Freq: Once | INTRAVENOUS | Status: AC
  Administered 2022-10-13: 1000 mL via INTRAVENOUS

## 2022-10-13 MED ORDER — TRAMADOL HCL 50 MG PO TABS
50.0000 mg | ORAL_TABLET | Freq: Once | ORAL | Status: AC
  Administered 2022-10-13: 50 mg via ORAL
  Filled 2022-10-13: qty 1

## 2022-10-13 NOTE — ED Notes (Signed)
Patient transported to X-ray 

## 2022-10-13 NOTE — ED Notes (Signed)
Patient ambulated in room with walker. Patient tolerated well, no signs of respiratory distress during ambulation.

## 2022-10-13 NOTE — ED Notes (Signed)
[facility] has been called for pt

## 2022-10-13 NOTE — Discharge Instructions (Signed)
You were seen today for generally not feeling well.  Your urinalysis does not show any evidence of UTI.  Take Zofran for your nausea.  It is very important that you drink plenty of fluids to stay hydrated.  You need to follow-up closely with your primary doctor for recheck of your kidney function.  Regarding your back pain, your x-rays are negative.  Continue tramadol at home as needed.  You may add 2 extra strength Tylenol 3 times daily as needed for pain.

## 2022-10-13 NOTE — ED Notes (Signed)
ED Provider at bedside.

## 2022-10-14 ENCOUNTER — Other Ambulatory Visit: Payer: Self-pay | Admitting: Family Medicine

## 2022-10-14 DIAGNOSIS — R32 Unspecified urinary incontinence: Secondary | ICD-10-CM

## 2022-10-15 LAB — URINE CULTURE: Culture: 400 — AB

## 2022-10-16 ENCOUNTER — Telehealth: Payer: Self-pay | Admitting: Family Medicine

## 2022-10-16 NOTE — Telephone Encounter (Signed)
Left detailed message to call back to schedule follow up visit.

## 2022-10-16 NOTE — Telephone Encounter (Signed)
Patient called to advise she was seen in the ER Friday night and was diagnosed with HAD and needs to see a kidney specialist so she wanted to know if Dr. Etter Sjogren can refer her to one.   Also, patient said that her daughter needs help taking care of her and they would like to know the process of obtaining a home health nurse covered by Medicare. She said it would be to assist with baths and preparing meals.   Please call patient to discuss.

## 2022-10-16 NOTE — Progress Notes (Signed)
ED Antimicrobial Stewardship Positive Culture Follow Up   Margaret Dyer is an 87 y.o. female who presented to [facility] on 10/12/2022 with a chief complaint of  Chief Complaint  Patient presents with   Weakness    Recent Results (from the past 720 hour(s))  Urine Culture     Status: Abnormal   Collection Time: 10/13/22  1:18 AM   Specimen: In/Out Cath Urine  Result Value Ref Range Status   Specimen Description IN/OUT CATH URINE  Final   Special Requests   Final    NONE Performed at [facility], [address], [place], [place] [address]    Culture 400 COLONIES/mL MORGANELLA MORGANII (A)  Final   Report Status 10/15/2022 FINAL  Final   Organism ID, Bacteria MORGANELLA MORGANII (A)  Final      Susceptibility   Morganella morganii - MIC*    AMPICILLIN >=32 RESISTANT Resistant     CEFAZOLIN >=64 RESISTANT Resistant     CIPROFLOXACIN <=0.25 SENSITIVE Sensitive     GENTAMICIN <=1 SENSITIVE Sensitive     IMIPENEM 2 SENSITIVE Sensitive     NITROFURANTOIN RESISTANT Resistant     TRIMETH/SULFA <=20 SENSITIVE Sensitive     AMPICILLIN/SULBACTAM 4 SENSITIVE Sensitive     PIP/TAZO <=4 SENSITIVE Sensitive     * 400 COLONIES/mL MORGANELLA MORGANII    _0  Treated with cefdinir, organism resistant to prescribed antimicrobial. No antibiotics indicated at this time. Patient admitted with back pain and weakness. Urine analysis normal without nitrites or leukocytes. Urine culture grew 400 colonies/mL morganella morganii. Discussed with ED provider and determined this is likely not an infection based on UA and culture.   Plan: Stop cefdinir  ED Provider: Regan Lemming, MD   Jeneen Rinks 32/91/9166, 0:60 AM Clinical Pharmacist Monday - Friday phone -  [phone] Saturday - Sunday phone - [phone]

## 2022-10-17 ENCOUNTER — Telehealth (HOSPITAL_BASED_OUTPATIENT_CLINIC_OR_DEPARTMENT_OTHER): Payer: Self-pay | Admitting: *Deleted

## 2022-10-17 NOTE — Telephone Encounter (Signed)
Post ED Visit - Positive Culture Follow-up  Culture report reviewed by antimicrobial stewardship pharmacist: [place] Team _0  Elenor Quinones, Pharm.D. _1  Heide Guile, Pharm.D., BCPS AQ-ID _2  Parks Neptune, Pharm.D., BCPS _3  Alycia Rossetti, Pharm.D., BCPS _4  [place], [place].D., BCPS, AAHIVP _5  Legrand Como, Pharm.D., BCPS, AAHIVP _6  Salome Arnt, PharmD, BCPS _7  Johnnette Gourd, PharmD, BCPS _8  Hughes Better, PharmD, BCPS _9  Leeroy Cha, PharmD _10  Laqueta Linden, PharmD, BCPS _11  Albertina Parr, PharmD  [facility] Team _12  Leodis Sias, PharmD _13  Lindell Spar, PharmD _14  Royetta Asal, PharmD _15  Graylin Shiver, Rph _16  Rema Fendt) Glennon Mac, PharmD _17  Arlyn Dunning, PharmD _18  Netta Cedars, PharmD _19  Dia Sitter, PharmD _20  Leone Haven, PharmD _21  Gretta Arab, PharmD _22  Theodis Shove, PharmD _23  Peggyann Juba, PharmD _24  Reuel Boom, PharmD   Positive urine culture Stop Cefdinir and no further patient follow-up is required at this time.  Regan Lemming, [place]  Harlon Flor Talley 10/17/2022, 11:50 AM

## 2022-10-18 ENCOUNTER — Telehealth: Payer: Self-pay | Admitting: Family Medicine

## 2022-10-18 ENCOUNTER — Ambulatory Visit: Payer: Medicare Other

## 2022-10-18 NOTE — Telephone Encounter (Signed)
Patient states: -She received a call from [place], health coach at [facility]  - She is unsure of the reasoning for the call but recalls something about her annual wellness visit   While patient was on the phone, an appointment note was added to her AWV for 11/15 @ 11:30am. Note stated that a VM was left from Linard Millers. I was able to warm transfer patient to Linard Millers.

## 2022-10-19 ENCOUNTER — Telehealth: Payer: Self-pay | Admitting: Family Medicine

## 2022-10-19 NOTE — Telephone Encounter (Signed)
Pt's daughter asked if we could give her a call before her appt with Mickel Baas tomorrow, to explain what is going on as she feels her mom may not be completely honest. [name] is on dpr.

## 2022-10-20 ENCOUNTER — Ambulatory Visit (INDEPENDENT_AMBULATORY_CARE_PROVIDER_SITE_OTHER): Payer: Medicare Other | Admitting: Family

## 2022-10-20 ENCOUNTER — Encounter: Payer: Self-pay | Admitting: Family

## 2022-10-20 VITALS — BP 136/88 | HR 65 | Temp 97.4°F | Resp 18 | Ht 63.0 in | Wt 182.8 lb

## 2022-10-20 DIAGNOSIS — I5032 Chronic diastolic (congestive) heart failure: Secondary | ICD-10-CM | POA: Diagnosis not present

## 2022-10-20 DIAGNOSIS — J9611 Chronic respiratory failure with hypoxia: Secondary | ICD-10-CM

## 2022-10-20 DIAGNOSIS — R7989 Other specified abnormal findings of blood chemistry: Secondary | ICD-10-CM

## 2022-10-20 DIAGNOSIS — R300 Dysuria: Secondary | ICD-10-CM

## 2022-10-20 LAB — POCT URINALYSIS DIP (CLINITEK)
Bilirubin, UA: NEGATIVE
Blood, UA: NEGATIVE
Glucose, UA: 100 mg/dL — AB
Ketones, POC UA: NEGATIVE mg/dL
Nitrite, UA: POSITIVE — AB
POC PROTEIN,UA: NEGATIVE
Spec Grav, UA: <=1.005 — AB
Urobilinogen, UA: 0.2 U/dL
pH, UA: 5

## 2022-10-20 MED ORDER — LEVOFLOXACIN 250 MG PO TABS: 250.0000 mg | ORAL_TABLET | Freq: Every day | ORAL | 0 refills | Status: DC

## 2022-10-20 MED ORDER — ONDANSETRON HCL 4 MG PO TABS: 4.0000 mg | ORAL_TABLET | Freq: Three times a day (TID) | ORAL | 0 refills | Status: AC | PRN

## 2022-10-20 NOTE — Patient Instructions (Signed)
1) We will call you on Monday to check on you regarding the urine culture.  2) You will be contacted to get scheduled for the nephrologist;  3) You have refills on Tramadol at the pharmacy from Dr. Etter Sjogren. You should be able to pick this up.   4) Please keep the appointment with your orthopedist. They can order the MRI. Since you have not been on Gabapentin for 3-4 years, I do not feel comfortable starting today. Please talk to the orthopedist or Dr. Etter Sjogren when she is back.  5) You will be contacted to see above available in home resources that are available to you.

## 2022-10-20 NOTE — Progress Notes (Signed)
Margaret Dyer is a 87 y.o. female with the following history as recorded in EpicCare:  Patient Active Problem List   Diagnosis Date Noted   Atelectasis 02/10/2022   Restrictive lung disease 12/20/2021   OSA (obstructive sleep apnea) 10/05/2021   Erythropoietin deficiency anemia 09/19/2021   Iron deficiency anemia due to chronic blood loss 09/19/2021   Uncontrolled type 2 diabetes mellitus with hyperglycemia ([place]) 06/09/2021   Chronic renal disease, stage 4, severely decreased glomerular filtration rate (GFR) between 15-29 mL/min/1.73 square meter ([place]) 06/09/2021   Closed fracture of maxillary sinus ([place]) 07/10/2019   UTI (urinary tract infection) 07/03/2019   Food poisoning 07/03/2019   Bacteremia 07/03/2019   Exposure to COVID-19 virus 06/16/2019   Closed left hip fracture ([place]) 12/24/2018   Dysuria 12/17/2017   Dizziness 12/17/2017   Urinary incontinence 11/09/2017   Tic 11/09/2017   Pulmonary hypertension ([place]) 09/12/2017   Chronic diastolic CHF (congestive heart failure) ([place]) 06/05/2017   Oral candida 05/29/2017   Vitamin D deficiency 05/29/2017   Chronic hypoxemic respiratory failure ([place]) 05/24/2017   Anxiety 05/24/2017   Right heart failure ([place]) 05/17/2017   Chronic diastolic heart failure (HCC)    Exertional dyspnea 05/13/2017   Asthma 05/13/2017   Shortness of breath 05/13/2017   Diabetes mellitus type 2, insulin dependent ([place]) 05/13/2017   Acute kidney injury ([place]) 05/13/2017   History of nephrectomy 05/13/2017   Hyperlipidemia LDL goal <70 05/13/2017   Liver cirrhosis secondary to NASH ([place]) 05/13/2017   Thrombocytopenia ([place]) 05/13/2017   Renal cell carcinoma 05/13/2017   Hypothyroidism 05/13/2017   Hypersplenism 05/13/2017   Osteoarthritis 05/13/2017   Overactive bladder 05/13/2017   Abrasion, left knee, initial encounter 02/22/2017   CTS (carpal tunnel syndrome) 03/04/2016   Bilateral contusion of ribs 10/05/2015   Dental abscess 04/15/2014    Claudication, intermittent ([place]) 12/05/2013   Obesity (BMI 30-39.9) 07/22/2013   Back pain 05/26/2013   Platelets decreased ([place]) 05/26/2013   Breast pain, right 04/21/2013   Anxiety and depression 12/05/2012   Bronchitis 11/06/2012   HEMATURIA UNSPECIFIED 02/17/2011   Hyperlipidemia 09/08/2010   B12 DEFICIENCY 07/22/2010   DIZZINESS 07/04/2010   Essential hypertension 06/21/2010   GOUT, UNSPECIFIED 01/27/2010   NEPHRECTOMY, HX OF 12/02/2009   NECK PAIN, LEFT 09/04/2008   PARESTHESIA 09/02/2007   GOITER NOS 04/23/2007   Diabetes mellitus ([place]) 04/23/2007   Asthma 04/23/2007   Renal stone 04/23/2007   OSTEOPENIA 04/23/2007   CARCINOMA, RENAL CELL 08/10/1999    Current Outpatient Medications  Medication Sig Dispense Refill   ACCU-CHEK GUIDE test strip CHECK BLOOD GLUCOSE FOUR TIMES DAILY     Accu-Chek Softclix Lancets lancets Check BG 4 times/day.  Dx E11.65     albuterol (PROVENTIL) (2.5 MG/3ML) 0.083% nebulizer solution Take 3 mLs (2.5 mg total) by nebulization every 6 (six) hours as needed for wheezing or shortness of breath. 75 mL 12   aspirin EC 81 MG EC tablet Take 1 tablet (81 mg total) by mouth daily.     carvedilol (COREG) 12.5 MG tablet Take 1 tablet (12.5 mg total) by mouth 2 (two) times daily. 180 tablet 3   docusate sodium (COLACE) 100 MG capsule Take 1 capsule (100 mg total) by mouth 2 (two) times daily. 10 capsule 0   ezetimibe-simvastatin (VYTORIN) 10-20 MG tablet Take 1 tablet by mouth daily. 90 tablet 1   hydrALAZINE (APRESOLINE) 100 MG tablet Take 1 tablet (100 mg total) by mouth 3 (three) times daily. 90 tablet 5  insulin glargine (LANTUS) 100 UNIT/ML injection Inject 0.25 mLs (25 Units total) into the skin at bedtime. (Patient taking differently: Inject 10 Units into the skin at bedtime.)     Insulin Glulisine (APIDRA SOLOSTAR) 100 UNIT/ML Solostar Pen Inject 10-15 Units into the skin 3 (three) times daily before meals.      ipratropium-albuterol (DUONEB)  0.5-2.5 (3) MG/3ML SOLN Take 3 mLs by nebulization 3 (three) times daily. 360 mL 3   levofloxacin (LEVAQUIN) 250 MG tablet Take 1 tablet (250 mg total) by mouth daily. 5 tablet 0   levothyroxine (SYNTHROID, LEVOTHROID) 25 MCG tablet take 1 tablet by mouth daily (Patient taking differently: Take 25 mcg by mouth. Daily except none on Saturdays and Sundays) 30 tablet 5   LORazepam (ATIVAN) 0.5 MG tablet TAKE 1 TABLET BY MOUTH DAILY AS NEEDED FOR ANXIETY (Patient taking differently: Take 0.5 mg by mouth every other day.) 30 tablet 1   Multiple Vitamins-Minerals (CENTRUM SILVER ULTRA WOMENS PO) Take 1 tablet by mouth at bedtime.      nystatin cream (MYCOSTATIN) Apply 1 application topically 2 (two) times daily. (Patient taking differently: Apply 1 application  topically daily as needed (for yeast infection).) 30 g 3   ondansetron (ZOFRAN) 4 MG tablet Take 1 tablet (4 mg total) by mouth every 8 (eight) hours as needed for nausea or vomiting. 20 tablet 0   oxybutynin (DITROPAN) 5 MG tablet TAKE 1 TABLET BY MOUTH DAILY 90 tablet 1   polyethylene glycol (MIRALAX / GLYCOLAX) 17 g packet Take 17 g by mouth 3 (three) times a week.     potassium chloride SA (KLOR-CON M) 20 MEQ tablet TAKE 1 TABLET BY MOUTH DAILY AT BEDTIME (Patient taking differently: Take 20 mEq by mouth at bedtime.) 30 tablet 11   sucralfate (CARAFATE) 1 g tablet Take 1 tablet (1 g total) by mouth 4 (four) times daily -  with meals and at bedtime. (Patient taking differently: Take 1 g by mouth daily as needed.) 90 tablet 0   torsemide (DEMADEX) 20 MG tablet Take 2 tablets (28m) by mouth in the morning and 1 tablet (252m in the afternoon. (Patient taking differently: Take 40 mg by mouth daily. .)Marland Kitchen225 tablet 3   traMADol (ULTRAM) 50 MG tablet TAKE 1 TABLET BY MOUTH EVERY 6 HOURS AS NEEDED FOR MODERATE PAIN (Patient taking differently: Take 50 mg by mouth daily as needed for moderate pain.) 60 tablet 1   valsartan (DIOVAN) 160 MG tablet TAKE 1  TABLET BY MOUTH DAILY 90 tablet 1   No current facility-administered medications for this visit.    Allergies: Other, Cefuroxime axetil, Lisinopril, Bactrim [sulfamethoxazole-trimethoprim], and Ciprofloxacin  Past Medical History:  Diagnosis Date   Asthma    Blood transfusion    Chronic diastolic CHF (congestive heart failure) (HCC)    CKD (chronic kidney disease), stage III (HCC)    Coronary artery calcification seen on CT scan    Diabetes mellitus    Erythropoietin deficiency anemia 09/19/2021   Goiter    Hyperlipidemia    Hypertension    Hypothyroidism    Iron deficiency anemia due to chronic blood loss 09/19/2021   Liver cirrhosis (HCC)    NASH (nonalcoholic steatohepatitis)    Obesity    Osteopenia    Pulmonary hypertension (HCC)    Renal cell carcinoma    a. prior nephrectomy.   Right heart failure (HCC)    Thrombocytopenia (HCC)     Past Surgical History:  Procedure Laterality Date  ABDOMINAL HYSTERECTOMY     APPENDECTOMY     HEMORRHOID SURGERY     INTRAMEDULLARY (IM) NAIL INTERTROCHANTERIC Left 12/25/2018   Procedure: INTRAMEDULLARY (IM) NAIL INTERTROCHANTRIC;  Surgeon: Nicholes Stairs, MD;  Location: [facility];  Service: Orthopedics;  Laterality: Left;   KNEE SURGERY     NEPHRECTOMY  09.2000    Family History  Problem Relation Age of Onset   Coronary artery disease Mother    Kidney cancer Father    Cancer Father        renal   COPD Father    Congestive Heart Failure Father    Cancer Sister        bladder   Coronary artery disease Other    Diabetes Other    Hyperlipidemia Other    Hypertension Other    Rheumatologic disease Neg Hx     Social History   Tobacco Use   Smoking status: Former    Packs/day: 0.25    Types: Cigarettes    Quit date: 12/04/1973    Years since quitting: 48.9   Smokeless tobacco: Never   Tobacco comments:    05/30/18 none in 40 years  Substance Use Topics   Alcohol use: No    Alcohol/week: 0.0 standard drinks of alcohol     Subjective:   Presents today with numerous concerns-   Was seen at ER last week with concerns for UTI- she was found to have elevated creatinine level and told that she needed to see nephrology; she was on Keflex when seen at ER last week- urine culture from 11/10 did indicate that antibiotic was warranted- she does not remember being contacted by ER provider;  Asking for refill on Zofran- has been having increased nausea;  Also would like information on resources that she can use to help keep her in home safely;    Objective:  Vitals:   10/20/22 1519  BP: 136/88  Pulse: 65  Resp: 18  Temp: (!) 97.4 F (36.3 C)  TempSrc: Oral  SpO2: 97%  Weight: 182 lb 12.8 oz (82.9 kg)  Height: _0  (1.6 m)    General: Well developed, well nourished, in no acute distress  Skin : Warm and dry.  Head: Normocephalic and atraumatic  Lungs: Respirations unlabored; on oxygen;  Neurologic: Alert and oriented; speech intact; face symmetrical; uses rolling walker;  Assessment:  1. Dysuria   2. Elevated serum creatinine   3. Chronic diastolic heart failure ([place])   4. Chronic hypoxemic respiratory failure ([place])     Plan:  Reviewed urine culture from ER visit on 11/10- she was on Keflex at time of that OV which would not cover the infection; patient notes she is very concerned about cost of new medication- will try low dose Levaquin ( Cipro was nausea); did update Rx for Zofran as well; follow up to be determined based on today's culture; Refer to nephrology; & 4. Referral to coordinated community care; 5.   She will follow up with her orthopedist ( scheduled for later this month) about chronic back issue; she can discuss re-starting Gabapentin with that provider. She does have current Rx for Tramadol from her PCP.   No follow-ups on file.  Orders Placed This Encounter  Procedures   Urine Culture   Ambulatory referral to Nephrology    Referral Priority:   Routine    Referral Type:    Consultation    Referral Reason:   Specialty Services Required    Requested Specialty:  Nephrology    Number of Visits Requested:   1   AMB Referral to [facility] Coordinaton    Referral Priority:   Routine    Referral Type:   Consultation    Referral Reason:   Care Coordination    Number of Visits Requested:   1   POCT URINALYSIS DIP (CLINITEK)    Requested Prescriptions   Signed Prescriptions Disp Refills   levofloxacin (LEVAQUIN) 250 MG tablet 5 tablet 0    Sig: Take 1 tablet (250 mg total) by mouth daily.   ondansetron (ZOFRAN) 4 MG tablet 20 tablet 0    Sig: Take 1 tablet (4 mg total) by mouth every 8 (eight) hours as needed for nausea or vomiting.

## 2022-10-23 ENCOUNTER — Telehealth: Payer: Self-pay | Admitting: *Deleted

## 2022-10-23 ENCOUNTER — Other Ambulatory Visit: Payer: Self-pay | Admitting: Family

## 2022-10-23 LAB — URINE CULTURE
MICRO NUMBER:: 14205257
SPECIMEN QUALITY:: ADEQUATE

## 2022-10-23 MED ORDER — AMOXICILLIN-POT CLAVULANATE 400-57 MG PO CHEW: 1.0000 | CHEWABLE_TABLET | Freq: Two times a day (BID) | ORAL | 0 refills | Status: DC

## 2022-10-23 NOTE — Telephone Encounter (Signed)
FYI to providers:  I have called the pts daughter back and informed her that I am sorry about the delay in returning her call. I did inquire to see what she wanted to relay to the providers prior to he office visit.   Pt's daughter reports that her mom has recurrent UTI's and she is living a very sedentary life style. Pt's daughter reports her routine as  ~Gets up at 10:15 am to sit on recliner ~Goes to bed at 4 pm for a nap  ~Then gets up to eat at 6:30 pm for Dinner  ~Then back to recliner after dinner ~Then goes to Bed at 10:30 pm.    She still lives with her daughter and even when her daughter asks her to move around and do something around the house, she declines and still stay in her recliner.   Daughter stated that she wanted some help around the house and wants her moms lower back pain and nausea addressed. I have reviewed the providers notes and informed the daughter that they have talked about the nausea and we have given her a few referrals to help her care management and kidney specialist. I have informed her that it looks like during this visit her mom did a good job reporting what she needs.   Pt's daughter has agreed with the comment and will let us know if there is anything else that they have missed. I have informed her that if PT is needed then she can make another appointment to discuss that with her PCP for they have a closer relationship and the daughter has stated understanding.

## 2022-10-23 NOTE — Progress Notes (Signed)
  Care Coordination   Note   10/23/2022 Name: JAYLEI FUERTE MRN: [id number] DOB: 10/03/1935  Margaret Dyer is a 87 y.o. year old female who sees Carollee Herter, Alferd Apa, DO for primary care. I reached out to [name] by phone today to offer care coordination services.  [name] was given information about Care Coordination services today including:   The Care Coordination services include support from the care team which includes your Nurse Coordinator, Clinical Social Worker, or Pharmacist.  The Care Coordination team is here to help remove barriers to the health concerns and goals most important to you. Care Coordination services are voluntary, and the patient may decline or stop services at any time by request to their care team member.   Care Coordination Consent Status: Patient did not agree to participate in care coordination services at this time.  Follow up plan:  pt declined to schedule at this time - contact info given if needed   Encounter Outcome:  Pt. Refused  Julian Hy, [place] Direct Dial: [phone]

## 2022-10-24 ENCOUNTER — Other Ambulatory Visit: Payer: Self-pay | Admitting: Family

## 2022-10-24 ENCOUNTER — Emergency Department (HOSPITAL_COMMUNITY): Payer: Medicare Other

## 2022-10-24 ENCOUNTER — Inpatient Hospital Stay (HOSPITAL_COMMUNITY)
Admission: EM | Admit: 2022-10-24 | Discharge: 2022-12-04 | DRG: 423 | Disposition: E | Payer: Medicare Other | Attending: Internal Medicine | Admitting: Internal Medicine

## 2022-10-24 ENCOUNTER — Encounter (HOSPITAL_COMMUNITY): Payer: Self-pay | Admitting: Emergency Medicine

## 2022-10-24 DIAGNOSIS — G893 Neoplasm related pain (acute) (chronic): Secondary | ICD-10-CM | POA: Diagnosis present

## 2022-10-24 DIAGNOSIS — B961 Klebsiella pneumoniae [K. pneumoniae] as the cause of diseases classified elsewhere: Secondary | ICD-10-CM | POA: Diagnosis present

## 2022-10-24 DIAGNOSIS — N179 Acute kidney failure, unspecified: Secondary | ICD-10-CM | POA: Diagnosis not present

## 2022-10-24 DIAGNOSIS — E039 Hypothyroidism, unspecified: Secondary | ICD-10-CM | POA: Diagnosis present

## 2022-10-24 DIAGNOSIS — J45909 Unspecified asthma, uncomplicated: Secondary | ICD-10-CM | POA: Diagnosis present

## 2022-10-24 DIAGNOSIS — J9621 Acute and chronic respiratory failure with hypoxia: Secondary | ICD-10-CM | POA: Diagnosis not present

## 2022-10-24 DIAGNOSIS — Z515 Encounter for palliative care: Secondary | ICD-10-CM | POA: Diagnosis not present

## 2022-10-24 DIAGNOSIS — R0602 Shortness of breath: Secondary | ICD-10-CM | POA: Diagnosis not present

## 2022-10-24 DIAGNOSIS — M8448XA Pathological fracture, other site, initial encounter for fracture: Secondary | ICD-10-CM | POA: Diagnosis present

## 2022-10-24 DIAGNOSIS — M545 Low back pain, unspecified: Secondary | ICD-10-CM | POA: Diagnosis not present

## 2022-10-24 DIAGNOSIS — D631 Anemia in chronic kidney disease: Secondary | ICD-10-CM | POA: Diagnosis present

## 2022-10-24 DIAGNOSIS — M8440XA Pathological fracture, unspecified site, initial encounter for fracture: Secondary | ICD-10-CM | POA: Diagnosis present

## 2022-10-24 DIAGNOSIS — Z90710 Acquired absence of both cervix and uterus: Secondary | ICD-10-CM

## 2022-10-24 DIAGNOSIS — K5900 Constipation, unspecified: Secondary | ICD-10-CM | POA: Diagnosis not present

## 2022-10-24 DIAGNOSIS — Z85528 Personal history of other malignant neoplasm of kidney: Secondary | ICD-10-CM

## 2022-10-24 DIAGNOSIS — D731 Hypersplenism: Secondary | ICD-10-CM | POA: Diagnosis not present

## 2022-10-24 DIAGNOSIS — Z510 Encounter for antineoplastic radiation therapy: Secondary | ICD-10-CM | POA: Diagnosis not present

## 2022-10-24 DIAGNOSIS — M549 Dorsalgia, unspecified: Secondary | ICD-10-CM

## 2022-10-24 DIAGNOSIS — C7951 Secondary malignant neoplasm of bone: Secondary | ICD-10-CM | POA: Diagnosis not present

## 2022-10-24 DIAGNOSIS — I132 Hypertensive heart and chronic kidney disease with heart failure and with stage 5 chronic kidney disease, or end stage renal disease: Secondary | ICD-10-CM | POA: Diagnosis not present

## 2022-10-24 DIAGNOSIS — Z794 Long term (current) use of insulin: Secondary | ICD-10-CM | POA: Diagnosis not present

## 2022-10-24 DIAGNOSIS — F419 Anxiety disorder, unspecified: Secondary | ICD-10-CM | POA: Diagnosis not present

## 2022-10-24 DIAGNOSIS — Z833 Family history of diabetes mellitus: Secondary | ICD-10-CM

## 2022-10-24 DIAGNOSIS — Z7982 Long term (current) use of aspirin: Secondary | ICD-10-CM

## 2022-10-24 DIAGNOSIS — R32 Unspecified urinary incontinence: Secondary | ICD-10-CM | POA: Diagnosis present

## 2022-10-24 DIAGNOSIS — Z9981 Dependence on supplemental oxygen: Secondary | ICD-10-CM

## 2022-10-24 DIAGNOSIS — Z881 Allergy status to other antibiotic agents status: Secondary | ICD-10-CM

## 2022-10-24 DIAGNOSIS — Z8051 Family history of malignant neoplasm of kidney: Secondary | ICD-10-CM

## 2022-10-24 DIAGNOSIS — E11649 Type 2 diabetes mellitus with hypoglycemia without coma: Secondary | ICD-10-CM | POA: Diagnosis not present

## 2022-10-24 DIAGNOSIS — E860 Dehydration: Secondary | ICD-10-CM | POA: Diagnosis present

## 2022-10-24 DIAGNOSIS — N184 Chronic kidney disease, stage 4 (severe): Secondary | ICD-10-CM | POA: Diagnosis not present

## 2022-10-24 DIAGNOSIS — I5082 Biventricular heart failure: Secondary | ICD-10-CM | POA: Diagnosis present

## 2022-10-24 DIAGNOSIS — S32425A Nondisplaced fracture of posterior wall of left acetabulum, initial encounter for closed fracture: Secondary | ICD-10-CM | POA: Diagnosis present

## 2022-10-24 DIAGNOSIS — Z905 Acquired absence of kidney: Secondary | ICD-10-CM | POA: Diagnosis not present

## 2022-10-24 DIAGNOSIS — C642 Malignant neoplasm of left kidney, except renal pelvis: Secondary | ICD-10-CM | POA: Diagnosis not present

## 2022-10-24 DIAGNOSIS — E1122 Type 2 diabetes mellitus with diabetic chronic kidney disease: Secondary | ICD-10-CM | POA: Diagnosis present

## 2022-10-24 DIAGNOSIS — I270 Primary pulmonary hypertension: Secondary | ICD-10-CM | POA: Diagnosis not present

## 2022-10-24 DIAGNOSIS — E119 Type 2 diabetes mellitus without complications: Secondary | ICD-10-CM

## 2022-10-24 DIAGNOSIS — R001 Bradycardia, unspecified: Secondary | ICD-10-CM | POA: Diagnosis not present

## 2022-10-24 DIAGNOSIS — J341 Cyst and mucocele of nose and nasal sinus: Secondary | ICD-10-CM | POA: Diagnosis not present

## 2022-10-24 DIAGNOSIS — N185 Chronic kidney disease, stage 5: Secondary | ICD-10-CM | POA: Diagnosis present

## 2022-10-24 DIAGNOSIS — R52 Pain, unspecified: Secondary | ICD-10-CM

## 2022-10-24 DIAGNOSIS — R933 Abnormal findings on diagnostic imaging of other parts of digestive tract: Secondary | ICD-10-CM | POA: Diagnosis not present

## 2022-10-24 DIAGNOSIS — W010XXA Fall on same level from slipping, tripping and stumbling without subsequent striking against object, initial encounter: Secondary | ICD-10-CM | POA: Diagnosis present

## 2022-10-24 DIAGNOSIS — K828 Other specified diseases of gallbladder: Secondary | ICD-10-CM | POA: Diagnosis not present

## 2022-10-24 DIAGNOSIS — J811 Chronic pulmonary edema: Secondary | ICD-10-CM | POA: Diagnosis not present

## 2022-10-24 DIAGNOSIS — Y95 Nosocomial condition: Secondary | ICD-10-CM

## 2022-10-24 DIAGNOSIS — T380X5A Adverse effect of glucocorticoids and synthetic analogues, initial encounter: Secondary | ICD-10-CM | POA: Diagnosis not present

## 2022-10-24 DIAGNOSIS — Z66 Do not resuscitate: Secondary | ICD-10-CM | POA: Diagnosis not present

## 2022-10-24 DIAGNOSIS — Z6841 Body Mass Index (BMI) 40.0 and over, adult: Secondary | ICD-10-CM | POA: Diagnosis not present

## 2022-10-24 DIAGNOSIS — R944 Abnormal results of kidney function studies: Secondary | ICD-10-CM | POA: Diagnosis present

## 2022-10-24 DIAGNOSIS — M8000XD Age-related osteoporosis with current pathological fracture, unspecified site, subsequent encounter for fracture with routine healing: Secondary | ICD-10-CM

## 2022-10-24 DIAGNOSIS — R772 Abnormality of alphafetoprotein: Secondary | ICD-10-CM | POA: Diagnosis present

## 2022-10-24 DIAGNOSIS — R109 Unspecified abdominal pain: Secondary | ICD-10-CM | POA: Diagnosis not present

## 2022-10-24 DIAGNOSIS — K5641 Fecal impaction: Secondary | ICD-10-CM | POA: Diagnosis not present

## 2022-10-24 DIAGNOSIS — R188 Other ascites: Secondary | ICD-10-CM | POA: Diagnosis not present

## 2022-10-24 DIAGNOSIS — R159 Full incontinence of feces: Secondary | ICD-10-CM | POA: Diagnosis present

## 2022-10-24 DIAGNOSIS — I2729 Other secondary pulmonary hypertension: Secondary | ICD-10-CM | POA: Diagnosis present

## 2022-10-24 DIAGNOSIS — J90 Pleural effusion, not elsewhere classified: Secondary | ICD-10-CM | POA: Diagnosis present

## 2022-10-24 DIAGNOSIS — E861 Hypovolemia: Secondary | ICD-10-CM | POA: Diagnosis not present

## 2022-10-24 DIAGNOSIS — I5032 Chronic diastolic (congestive) heart failure: Secondary | ICD-10-CM | POA: Diagnosis not present

## 2022-10-24 DIAGNOSIS — N390 Urinary tract infection, site not specified: Secondary | ICD-10-CM | POA: Diagnosis present

## 2022-10-24 DIAGNOSIS — I272 Pulmonary hypertension, unspecified: Secondary | ICD-10-CM | POA: Diagnosis present

## 2022-10-24 DIAGNOSIS — R627 Adult failure to thrive: Secondary | ICD-10-CM | POA: Diagnosis present

## 2022-10-24 DIAGNOSIS — S8012XA Contusion of left lower leg, initial encounter: Secondary | ICD-10-CM | POA: Diagnosis present

## 2022-10-24 DIAGNOSIS — J9611 Chronic respiratory failure with hypoxia: Secondary | ICD-10-CM | POA: Diagnosis not present

## 2022-10-24 DIAGNOSIS — N3289 Other specified disorders of bladder: Secondary | ICD-10-CM | POA: Diagnosis present

## 2022-10-24 DIAGNOSIS — C7801 Secondary malignant neoplasm of right lung: Secondary | ICD-10-CM | POA: Diagnosis present

## 2022-10-24 DIAGNOSIS — E1165 Type 2 diabetes mellitus with hyperglycemia: Secondary | ICD-10-CM | POA: Diagnosis present

## 2022-10-24 DIAGNOSIS — K429 Umbilical hernia without obstruction or gangrene: Secondary | ICD-10-CM | POA: Diagnosis not present

## 2022-10-24 DIAGNOSIS — J189 Pneumonia, unspecified organism: Secondary | ICD-10-CM | POA: Diagnosis not present

## 2022-10-24 DIAGNOSIS — G4700 Insomnia, unspecified: Secondary | ICD-10-CM | POA: Diagnosis present

## 2022-10-24 DIAGNOSIS — Z789 Other specified health status: Secondary | ICD-10-CM | POA: Diagnosis not present

## 2022-10-24 DIAGNOSIS — D696 Thrombocytopenia, unspecified: Secondary | ICD-10-CM | POA: Diagnosis present

## 2022-10-24 DIAGNOSIS — Z79899 Other long term (current) drug therapy: Secondary | ICD-10-CM

## 2022-10-24 DIAGNOSIS — M109 Gout, unspecified: Secondary | ICD-10-CM | POA: Diagnosis present

## 2022-10-24 DIAGNOSIS — C7802 Secondary malignant neoplasm of left lung: Secondary | ICD-10-CM | POA: Diagnosis not present

## 2022-10-24 DIAGNOSIS — W19XXXA Unspecified fall, initial encounter: Secondary | ICD-10-CM | POA: Diagnosis not present

## 2022-10-24 DIAGNOSIS — C220 Liver cell carcinoma: Principal | ICD-10-CM | POA: Diagnosis present

## 2022-10-24 DIAGNOSIS — F32A Depression, unspecified: Secondary | ICD-10-CM | POA: Diagnosis present

## 2022-10-24 DIAGNOSIS — K7581 Nonalcoholic steatohepatitis (NASH): Secondary | ICD-10-CM | POA: Diagnosis not present

## 2022-10-24 DIAGNOSIS — R7881 Bacteremia: Secondary | ICD-10-CM | POA: Diagnosis not present

## 2022-10-24 DIAGNOSIS — R58 Hemorrhage, not elsewhere classified: Secondary | ICD-10-CM | POA: Diagnosis present

## 2022-10-24 DIAGNOSIS — Y92002 Bathroom of unspecified non-institutional (private) residence single-family (private) house as the place of occurrence of the external cause: Secondary | ICD-10-CM

## 2022-10-24 DIAGNOSIS — G4733 Obstructive sleep apnea (adult) (pediatric): Secondary | ICD-10-CM | POA: Diagnosis present

## 2022-10-24 DIAGNOSIS — I700 Atherosclerosis of aorta: Secondary | ICD-10-CM | POA: Diagnosis not present

## 2022-10-24 DIAGNOSIS — J9811 Atelectasis: Secondary | ICD-10-CM | POA: Diagnosis not present

## 2022-10-24 DIAGNOSIS — Z8249 Family history of ischemic heart disease and other diseases of the circulatory system: Secondary | ICD-10-CM

## 2022-10-24 DIAGNOSIS — S32422A Displaced fracture of posterior wall of left acetabulum, initial encounter for closed fracture: Secondary | ICD-10-CM | POA: Diagnosis not present

## 2022-10-24 DIAGNOSIS — E870 Hyperosmolality and hypernatremia: Secondary | ICD-10-CM | POA: Diagnosis present

## 2022-10-24 DIAGNOSIS — N1832 Chronic kidney disease, stage 3b: Secondary | ICD-10-CM | POA: Diagnosis not present

## 2022-10-24 DIAGNOSIS — Z79890 Hormone replacement therapy (postmenopausal): Secondary | ICD-10-CM

## 2022-10-24 DIAGNOSIS — E8770 Fluid overload, unspecified: Secondary | ICD-10-CM | POA: Diagnosis not present

## 2022-10-24 DIAGNOSIS — C7800 Secondary malignant neoplasm of unspecified lung: Secondary | ICD-10-CM

## 2022-10-24 DIAGNOSIS — R911 Solitary pulmonary nodule: Secondary | ICD-10-CM | POA: Diagnosis not present

## 2022-10-24 DIAGNOSIS — K8020 Calculus of gallbladder without cholecystitis without obstruction: Secondary | ICD-10-CM | POA: Diagnosis not present

## 2022-10-24 DIAGNOSIS — Z23 Encounter for immunization: Secondary | ICD-10-CM

## 2022-10-24 DIAGNOSIS — I10 Essential (primary) hypertension: Secondary | ICD-10-CM | POA: Diagnosis present

## 2022-10-24 DIAGNOSIS — E785 Hyperlipidemia, unspecified: Secondary | ICD-10-CM | POA: Diagnosis present

## 2022-10-24 DIAGNOSIS — M4316 Spondylolisthesis, lumbar region: Secondary | ICD-10-CM | POA: Diagnosis not present

## 2022-10-24 DIAGNOSIS — Z825 Family history of asthma and other chronic lower respiratory diseases: Secondary | ICD-10-CM

## 2022-10-24 DIAGNOSIS — G9340 Encephalopathy, unspecified: Secondary | ICD-10-CM | POA: Diagnosis not present

## 2022-10-24 DIAGNOSIS — S32040A Wedge compression fracture of fourth lumbar vertebra, initial encounter for closed fracture: Secondary | ICD-10-CM | POA: Diagnosis not present

## 2022-10-24 DIAGNOSIS — Z888 Allergy status to other drugs, medicaments and biological substances status: Secondary | ICD-10-CM

## 2022-10-24 DIAGNOSIS — K7460 Unspecified cirrhosis of liver: Secondary | ICD-10-CM | POA: Diagnosis present

## 2022-10-24 DIAGNOSIS — G629 Polyneuropathy, unspecified: Secondary | ICD-10-CM | POA: Diagnosis present

## 2022-10-24 DIAGNOSIS — R410 Disorientation, unspecified: Secondary | ICD-10-CM | POA: Diagnosis not present

## 2022-10-24 DIAGNOSIS — C649 Malignant neoplasm of unspecified kidney, except renal pelvis: Secondary | ICD-10-CM | POA: Diagnosis not present

## 2022-10-24 DIAGNOSIS — R160 Hepatomegaly, not elsewhere classified: Secondary | ICD-10-CM | POA: Diagnosis not present

## 2022-10-24 DIAGNOSIS — S3993XA Unspecified injury of pelvis, initial encounter: Secondary | ICD-10-CM | POA: Diagnosis not present

## 2022-10-24 DIAGNOSIS — Z0189 Encounter for other specified special examinations: Secondary | ICD-10-CM | POA: Diagnosis not present

## 2022-10-24 DIAGNOSIS — J440 Chronic obstructive pulmonary disease with acute lower respiratory infection: Secondary | ICD-10-CM | POA: Diagnosis not present

## 2022-10-24 DIAGNOSIS — E875 Hyperkalemia: Secondary | ICD-10-CM | POA: Diagnosis present

## 2022-10-24 DIAGNOSIS — M47816 Spondylosis without myelopathy or radiculopathy, lumbar region: Secondary | ICD-10-CM | POA: Diagnosis not present

## 2022-10-24 DIAGNOSIS — Z452 Encounter for adjustment and management of vascular access device: Secondary | ICD-10-CM | POA: Diagnosis not present

## 2022-10-24 DIAGNOSIS — E669 Obesity, unspecified: Secondary | ICD-10-CM | POA: Diagnosis present

## 2022-10-24 DIAGNOSIS — Z01818 Encounter for other preprocedural examination: Secondary | ICD-10-CM

## 2022-10-24 DIAGNOSIS — I2510 Atherosclerotic heart disease of native coronary artery without angina pectoris: Secondary | ICD-10-CM | POA: Diagnosis present

## 2022-10-24 DIAGNOSIS — R059 Cough, unspecified: Secondary | ICD-10-CM | POA: Diagnosis not present

## 2022-10-24 DIAGNOSIS — N2889 Other specified disorders of kidney and ureter: Secondary | ICD-10-CM | POA: Diagnosis not present

## 2022-10-24 DIAGNOSIS — Z7189 Other specified counseling: Secondary | ICD-10-CM | POA: Diagnosis not present

## 2022-10-24 DIAGNOSIS — B370 Candidal stomatitis: Secondary | ICD-10-CM | POA: Diagnosis not present

## 2022-10-24 DIAGNOSIS — R110 Nausea: Secondary | ICD-10-CM | POA: Diagnosis not present

## 2022-10-24 DIAGNOSIS — J984 Other disorders of lung: Secondary | ICD-10-CM | POA: Diagnosis present

## 2022-10-24 DIAGNOSIS — R918 Other nonspecific abnormal finding of lung field: Secondary | ICD-10-CM | POA: Diagnosis not present

## 2022-10-24 DIAGNOSIS — K769 Liver disease, unspecified: Secondary | ICD-10-CM | POA: Diagnosis not present

## 2022-10-24 DIAGNOSIS — R531 Weakness: Secondary | ICD-10-CM | POA: Diagnosis not present

## 2022-10-24 DIAGNOSIS — M48061 Spinal stenosis, lumbar region without neurogenic claudication: Secondary | ICD-10-CM | POA: Diagnosis present

## 2022-10-24 DIAGNOSIS — S40021A Contusion of right upper arm, initial encounter: Secondary | ICD-10-CM | POA: Diagnosis present

## 2022-10-24 DIAGNOSIS — R54 Age-related physical debility: Secondary | ICD-10-CM | POA: Diagnosis present

## 2022-10-24 DIAGNOSIS — M8580 Other specified disorders of bone density and structure, unspecified site: Secondary | ICD-10-CM | POA: Diagnosis present

## 2022-10-24 DIAGNOSIS — Z87891 Personal history of nicotine dependence: Secondary | ICD-10-CM

## 2022-10-24 DIAGNOSIS — Z9889 Other specified postprocedural states: Secondary | ICD-10-CM | POA: Diagnosis not present

## 2022-10-24 DIAGNOSIS — M25552 Pain in left hip: Secondary | ICD-10-CM | POA: Diagnosis not present

## 2022-10-24 LAB — COMPREHENSIVE METABOLIC PANEL
ALT: 11 U/L (ref 0–44)
AST: 36 U/L (ref 15–41)
Albumin: 3.5 g/dL (ref 3.5–5.0)
Alkaline Phosphatase: 92 U/L (ref 38–126)
Anion gap: 17 — ABNORMAL HIGH (ref 5–15)
BUN: 57 mg/dL — ABNORMAL HIGH (ref 8–23)
CO2: 25 mmol/L (ref 22–32)
Calcium: 9.4 mg/dL (ref 8.9–10.3)
Chloride: 94 mmol/L — ABNORMAL LOW (ref 98–111)
Creatinine, Ser: 1.94 mg/dL — ABNORMAL HIGH (ref 0.44–1.00)
GFR, Estimated: 25 mL/min — ABNORMAL LOW (ref >60)
Glucose, Bld: 294 mg/dL — ABNORMAL HIGH (ref 70–99)
Potassium: 3.6 mmol/L (ref 3.5–5.1)
Sodium: 136 mmol/L (ref 135–145)
Total Bilirubin: 1.2 mg/dL (ref 0.3–1.2)
Total Protein: 6.8 g/dL (ref 6.5–8.1)

## 2022-10-24 LAB — CBC
HCT: 40.8 % (ref 36.0–46.0)
Hemoglobin: 13.3 g/dL (ref 12.0–15.0)
MCH: 29.3 pg (ref 26.0–34.0)
MCHC: 32.6 g/dL (ref 30.0–36.0)
MCV: 89.9 fL (ref 80.0–100.0)
Platelets: 107 10*3/uL — ABNORMAL LOW (ref 150–400)
RBC: 4.54 MIL/uL (ref 3.87–5.11)
RDW: 14.6 % (ref 11.5–15.5)
WBC: 5.8 10*3/uL (ref 4.0–10.5)
nRBC: 0 % (ref 0.0–0.2)

## 2022-10-24 MED ORDER — HYDROMORPHONE HCL 1 MG/ML IJ SOLN
0.5000 mg | Freq: Once | INTRAMUSCULAR | Status: AC
  Administered 2022-10-24: 0.5 mg via INTRAVENOUS
  Filled 2022-10-24: qty 1

## 2022-10-24 MED ORDER — ONDANSETRON HCL 4 MG/2ML IJ SOLN
4.0000 mg | Freq: Once | INTRAMUSCULAR | Status: AC
  Administered 2022-10-24: 4 mg via INTRAVENOUS
  Filled 2022-10-24: qty 2

## 2022-10-24 MED ORDER — HYDROMORPHONE HCL 1 MG/ML IJ SOLN
1.0000 mg | Freq: Once | INTRAMUSCULAR | Status: AC
  Administered 2022-10-24: 1 mg via INTRAVENOUS

## 2022-10-24 MED ORDER — AMOXICILLIN-POT CLAVULANATE 250-125 MG PO TABS: 1.0000 | ORAL_TABLET | Freq: Two times a day (BID) | ORAL | 0 refills | Status: AC

## 2022-10-24 MED ORDER — SODIUM CHLORIDE 0.9 % IV BOLUS
1000.0000 mL | Freq: Once | INTRAVENOUS | Status: AC
  Administered 2022-10-24: 1000 mL via INTRAVENOUS

## 2022-10-24 MED ORDER — HYDROMORPHONE HCL 1 MG/ML IJ SOLN
INTRAMUSCULAR | Status: AC
  Filled 2022-10-24: qty 1

## 2022-10-24 NOTE — ED Provider Notes (Signed)
[facility] EMERGENCY DEPARTMENT Provider Note   CSN: [id number] Arrival date & time: 10/26/2022  1450     History  Chief Complaint  Patient presents with   Weakness    Margaret Dyer is a 87 y.o. female.  Patient as above with significant medical history as below, including CKD, CHF, DM, HTN, hypothyroid, pulm htn who presents to the ED with complaint of hip / low back pain. Pt with recent fall in her bathroom, felt onto edge of bathtub on her left side/hip. Diff with ambulation since they, worsening low back pain on left. Medications at home not helping much with her pain and are making her nauseated. Pain to top of left hip and runs down length of her leg. No repeat falls but has been unable to get up out of her bed 2/2 severe pain.   Had a fall around 3-4 wks ago, pain ongoing. Worsened in last few days.   Intertroc fx s/p repair 1/20 Dr Stann Mainland   Past Medical History:  Diagnosis Date   Asthma    Blood transfusion    Chronic diastolic CHF (congestive heart failure) ([place])    CKD (chronic kidney disease), stage III ([place])    Coronary artery calcification seen on CT scan    Diabetes mellitus    Erythropoietin deficiency anemia 09/19/2021   Goiter    Hyperlipidemia    Hypertension    Hypothyroidism    Iron deficiency anemia due to chronic blood loss 09/19/2021   Liver cirrhosis (HCC)    NASH (nonalcoholic steatohepatitis)    Obesity    Osteopenia    Pulmonary hypertension (HCC)    Renal cell carcinoma    a. prior nephrectomy.   Right heart failure (HCC)    Thrombocytopenia ([place])     Past Surgical History:  Procedure Laterality Date   ABDOMINAL HYSTERECTOMY     APPENDECTOMY     HEMORRHOID SURGERY     INTRAMEDULLARY (IM) NAIL INTERTROCHANTERIC Left 12/25/2018   Procedure: INTRAMEDULLARY (IM) NAIL INTERTROCHANTRIC;  Surgeon: Nicholes Stairs, MD;  Location: [facility];  Service: Orthopedics;  Laterality: Left;   KNEE SURGERY     NEPHRECTOMY  09.2000      The history is provided by the patient. No language interpreter was used.  Weakness Associated symptoms: arthralgias   Associated symptoms: no abdominal pain, no chest pain, no cough, no dysuria, no fever, no headaches, no nausea and no shortness of breath        Home Medications Prior to Admission medications   Medication Sig Start Date End Date Taking? Authorizing Provider  ACCU-CHEK GUIDE test strip CHECK BLOOD GLUCOSE FOUR TIMES DAILY 08/05/21   [provider]  Accu-Chek Softclix Lancets lancets Check BG 4 times/day.  Dx E11.65 08/03/21   [provider]  albuterol (PROVENTIL) (2.5 MG/3ML) 0.083% nebulizer solution Take 3 mLs (2.5 mg total) by nebulization every 6 (six) hours as needed for wheezing or shortness of breath. 06/09/21   Roma Schanz R, DO  amoxicillin-clavulanate (AUGMENTIN) 250-125 MG tablet Take 1 tablet by mouth 2 (two) times daily for 7 days. 10/26/2022 10/31/22  Marrian Salvage, FNP  aspirin EC 81 MG EC tablet Take 1 tablet (81 mg total) by mouth daily. 07/07/19   Geradine Girt, DO  carvedilol (COREG) 12.5 MG tablet Take 1 tablet (12.5 mg total) by mouth 2 (two) times daily. 01/26/22 10/14/23  Pixie Casino, MD  docusate sodium (COLACE) 100 MG capsule Take 1 capsule (  100 mg total) by mouth 2 (two) times daily. 12/30/18   Bonnell Public, MD  ezetimibe-simvastatin (VYTORIN) 10-20 MG tablet Take 1 tablet by mouth daily. 07/21/22   Ann Held, DO  hydrALAZINE (APRESOLINE) 100 MG tablet Take 1 tablet (100 mg total) by mouth 3 (three) times daily. 07/17/22   Skeet Latch, MD  insulin glargine (LANTUS) 100 UNIT/ML injection Inject 0.25 mLs (25 Units total) into the skin at bedtime. Patient taking differently: Inject 10 Units into the skin at bedtime. 07/06/19   Geradine Girt, DO  Insulin Glulisine (APIDRA SOLOSTAR) 100 UNIT/ML Solostar Pen Inject 10-15 Units into the skin 3 (three) times daily before meals.     [provider]  ipratropium-albuterol (DUONEB) 0.5-2.5 (3) MG/3ML SOLN Take 3 mLs by nebulization 3 (three) times daily. 12/20/21   Margaretha Seeds, MD  levothyroxine (SYNTHROID, LEVOTHROID) 25 MCG tablet take 1 tablet by mouth daily Patient taking differently: Take 25 mcg by mouth. Daily except none on Saturdays and Sundays 07/13/17   Carollee Herter, Kendrick Fries R, DO  LORazepam (ATIVAN) 0.5 MG tablet TAKE 1 TABLET BY MOUTH DAILY AS NEEDED FOR ANXIETY Patient taking differently: Take 0.5 mg by mouth every other day. 07/03/22   Ann Held, DO  Multiple Vitamins-Minerals (CENTRUM SILVER ULTRA WOMENS PO) Take 1 tablet by mouth at bedtime.     [provider]  nystatin cream (MYCOSTATIN) Apply 1 application topically 2 (two) times daily. Patient taking differently: Apply 1 application  topically daily as needed (for yeast infection). 12/13/21   Roma Schanz R, DO  ondansetron (ZOFRAN) 4 MG tablet Take 1 tablet (4 mg total) by mouth every 8 (eight) hours as needed for nausea or vomiting. 10/20/22   Marrian Salvage, FNP  oxybutynin (DITROPAN) 5 MG tablet TAKE 1 TABLET BY MOUTH DAILY 10/16/22   Carollee Herter, Alferd Apa, DO  polyethylene glycol (MIRALAX / GLYCOLAX) 17 g packet Take 17 g by mouth 3 (three) times a week.    [provider]  potassium chloride SA (KLOR-CON M) 20 MEQ tablet TAKE 1 TABLET BY MOUTH DAILY AT BEDTIME Patient taking differently: Take 20 mEq by mouth at bedtime. 03/21/22   Hilty, Nadean Corwin, MD  sucralfate (CARAFATE) 1 g tablet Take 1 tablet (1 g total) by mouth 4 (four) times daily -  with meals and at bedtime. Patient taking differently: Take 1 g by mouth daily as needed. 12/02/19   Ann Held, DO  torsemide (DEMADEX) 20 MG tablet Take 2 tablets (62m) by mouth in the morning and 1 tablet (255m in the afternoon. Patient taking differently: Take 40 mg by mouth daily. . Marland Kitchen1/28/22   Hilty, KeNadean CorwinMD  traMADol (ULTRAM) 50 MG tablet TAKE 1  TABLET BY MOUTH EVERY 6 HOURS AS NEEDED FOR MODERATE PAIN Patient taking differently: Take 50 mg by mouth daily as needed for moderate pain. 09/20/22   LoRoma Schanz, DO  valsartan (DIOVAN) 160 MG tablet TAKE 1 TABLET BY MOUTH DAILY 09/05/22   RaSkeet LatchMD      Allergies    Other, Cefuroxime axetil, Lisinopril, Bactrim [sulfamethoxazole-trimethoprim], and Ciprofloxacin    Review of Systems   Review of Systems  Constitutional:  Positive for fatigue. Negative for activity change and fever.  HENT:  Negative for facial swelling and trouble swallowing.   Eyes:  Negative for discharge and redness.  Respiratory:  Negative for cough and shortness of breath.   Cardiovascular:  Negative for chest pain and palpitations.  Gastrointestinal:  Negative for abdominal pain and nausea.  Genitourinary:  Negative for dysuria and flank pain.  Musculoskeletal:  Positive for arthralgias and gait problem. Negative for back pain.  Skin:  Negative for pallor and rash.  Neurological:  Positive for weakness. Negative for syncope and headaches.  Psychiatric/Behavioral:  Positive for sleep disturbance.     Physical Exam Updated Vital Signs BP (!) 173/53 (BP Location: Left Arm)   Pulse 67   Temp (!) 97.5 F (36.4 C) (Oral)   Resp 18   Ht _0  (1.6 m)   Wt 83 kg   LMP  (LMP Unknown)   SpO2 100%   BMI 32.41 kg/m  Physical Exam Vitals and nursing note reviewed.  Constitutional:      General: She is not in acute distress.    Appearance: Normal appearance.  HENT:     Head: Normocephalic and atraumatic.     Right Ear: External ear normal.     Left Ear: External ear normal.     Nose: Nose normal.     Mouth/Throat:     Mouth: Mucous membranes are moist.  Eyes:     General: No scleral icterus.       Right eye: No discharge.        Left eye: No discharge.  Cardiovascular:     Rate and Rhythm: Normal rate and regular rhythm.     Pulses: Normal pulses.     Heart sounds: Normal heart  sounds.  Pulmonary:     Effort: Pulmonary effort is normal. No respiratory distress.     Breath sounds: Normal breath sounds.  Abdominal:     General: Abdomen is flat.     Tenderness: There is no abdominal tenderness.  Musculoskeletal:        General: Normal range of motion.     Cervical back: Normal range of motion.       Back:     Right lower leg: No edema.     Left lower leg: No edema.       Legs:     Comments: Bruising noted down her left leg. DP 2+ b/l   Skin:    General: Skin is warm and dry.     Capillary Refill: Capillary refill takes less than 2 seconds.  Neurological:     Mental Status: She is alert and oriented to person, place, and time.     GCS: GCS eye subscore is 4. GCS verbal subscore is 5. GCS motor subscore is 6.  Psychiatric:        Mood and Affect: Mood normal.        Behavior: Behavior normal.     ED Results / Procedures / Treatments   Labs (all labs ordered are listed, but only abnormal results are displayed) Labs Reviewed  CBC - Abnormal; Notable for the following components:      Result Value   Platelets 107 (*)    All other components within normal limits  COMPREHENSIVE METABOLIC PANEL - Abnormal; Notable for the following components:   Chloride 94 (*)    Glucose, Bld 294 (*)    BUN 57 (*)    Creatinine, Ser 1.94 (*)    GFR, Estimated 25 (*)    Anion gap 17 (*)    All other components within normal limits    EKG EKG Interpretation  Date/Time:  Tuesday October 24 2022 15:20:37 EST Ventricular Rate:  59 PR Interval:  142  QRS Duration: 82 QT Interval:  462 QTC Calculation: 457 R Axis:   12 Text Interpretation: Sinus bradycardia with Premature atrial complexes Possible Anterior infarct , age undetermined Abnormal ECG When compared with ECG of 13-Oct-2022 01:16, PREVIOUS ECG IS PRESENT similar to prior Confirmed by Wynona Dove ([id number]) on 10/25/2022 6:16:03 PM  Radiology CT Hip Left Wo Contrast  Result Date: 10/12/2022 CLINICAL DATA:   Hip trauma, fracture suspected. EXAM: CT OF THE LEFT HIP WITHOUT CONTRAST TECHNIQUE: Multidetector CT imaging of the left hip was performed according to the standard protocol. Multiplanar CT image reconstructions were also generated. RADIATION DOSE REDUCTION: This exam was performed according to the departmental dose-optimization program which includes automated exposure control, adjustment of the mA and/or kV according to patient size and/or use of iterative reconstruction technique. COMPARISON:  None Available. FINDINGS: Bones/Joint/Cartilage Status post left hip cephalomedullary nail placement. The hardware is intact. No perihardware fracture. There is a small fracture about the posterior acetabular wall of indeterminate age. Ligaments Suboptimally assessed by CT. Muscles and Tendons No intramuscular hematoma or fluid collection. Soft tissues Skin and subcutaneous soft tissues are within normal limits. Prominent vascular calcifications. IMPRESSION: 1. Status post left hip cephalomedullary nail placement. The hardware is intact. No perihardware fracture. 2. Small fracture about the posterior acetabular wall of indeterminate age. 3. No intramuscular hematoma or fluid collection. Electronically Signed   By: Keane Police D.O.   On: 10/18/2022 21:05   CT Lumbar Spine Wo Contrast  Result Date: 10/07/2022 CLINICAL DATA:  Low back pain, trauma. EXAM: CT LUMBAR SPINE WITHOUT CONTRAST TECHNIQUE: Multidetector CT imaging of the lumbar spine was performed without intravenous contrast administration. Multiplanar CT image reconstructions were also generated. RADIATION DOSE REDUCTION: This exam was performed according to the departmental dose-optimization program which includes automated exposure control, adjustment of the mA and/or kV according to patient size and/or use of iterative reconstruction technique. COMPARISON:  Radiographs dated October 13, 2022 FINDINGS: Segmentation: 5 lumbar type vertebrae. Alignment:  Grade 1 anterolisthesis of L4, chronic process. Vertebrae: Compression fracture of the L4 vertebral body. There is a proximally 20% vertebral body height loss. No significant retropulsion of the vertebral body into the spinal canal. Paraspinal and other soft tissues: Soft tissue swelling and hematoma about the vertebral body. Postsurgical changes for prior left nephrectomy. Advanced atherosclerotic disease of abdominal aorta and bilateral iliac vessels. Disc levels: Advanced multilevel degenerate disc disease with associated facet joint arthropathy. T12-L1: No significant spinal canal or neural foraminal stenosis. L1-L2: Disc osteophyte complex with mild bilateral lateral recess stenosis. Mild facet joint arthropathy. No significant neural foraminal stenosis. L2-L3: Disc osteophyte complex with bilateral lateral recess stenosis. Facet joint arthropathy. No significant neural foraminal stenosis. L3-L4: Mild retropulsion of the superior endplate of the L4 vertebral body into the spinal canal. Moderate bilateral lateral recess stenosis. Mild bilateral neural foraminal stenosis. L4-L5: Disc osteophyte complex with bilateral lateral recess stenosis. Mild to moderate bilateral neural foraminal stenosis, left worse than the right. Moderate bilateral facet joint arthropathy. L5-S1: Disc osteophyte complex with moderate lateral recess stenosis. Moderate-to-severe bilateral facet joint arthropathy. IMPRESSION: 1. Acute compression fracture of the L4 vertebral body with approximately 20% vertebral body height loss. Mild retropulsion of the superior endplate into the spinal canal. Further evaluation with MR examination is suggested. 2. Advanced multilevel degenerate disc disease and facet joint arthropathy as described above. 3. Aortic atherosclerosis. Aortic Atherosclerosis (ICD10-I70.0). Electronically Signed   By: Keane Police D.O.   On: 10/23/2022 20:56   CT PELVIS WO  CONTRAST  Result Date: 10/29/2022 CLINICAL DATA:   Hip trauma, fracture suspected.  Low back pain. EXAM: CT PELVIS WITHOUT CONTRAST TECHNIQUE: Multidetector CT imaging of the pelvis was performed following the standard protocol without intravenous contrast. RADIATION DOSE REDUCTION: This exam was performed according to the departmental dose-optimization program which includes automated exposure control, adjustment of the mA and/or kV according to patient size and/or use of iterative reconstruction technique. COMPARISON:  10/21/2022. FINDINGS: Urinary Tract:  No abnormality visualized. Bowel:  No acute abnormality. Vascular/Lymphatic: Atherosclerotic calcification of the distal abdominal aorta and its tributaries. No abdominal or pelvic lymphadenopathy. Reproductive:  Hysterectomy changes are noted. Other: A small amount of free fluid is present in the pelvis. Small fat containing umbilical hernia. Musculoskeletal: No acute fracture or dislocation at the left hip. Fixation hardware is present in the proximal left femur without evidence of hardware loosening. A compression fracture is noted in the superior endplate of L4, partially visualized on exam. Degenerative changes are noted in the lower lumbar spine. IMPRESSION: 1. No acute fracture or dislocation at the left hip. 2. Compression deformity in the superior endplate of L4, partially visualized on exam. Please refer to CT lumbar spine for additional information. 3. Aortic atherosclerosis. Electronically Signed   By: Brett Fairy M.D.   On: 10/15/2022 20:44   DG Hip Unilat W or Wo Pelvis 2-3 Views Left  Result Date: 11/02/2022 CLINICAL DATA:  Left hip pain. Weakness. Fall 1 month ago. EXAM: DG HIP (WITH OR WITHOUT PELVIS) 2-3V LEFT COMPARISON:  None Available. FINDINGS: Intramedullary nail with trans trochanteric and distal locking screw fixation of remote proximal femur fracture. There is no periprosthetic fracture or lucency. Femoral head is well seated, no dislocation. Intact pubic rami. Pubic symphysis  and sacroiliac joints are congruent. The bones are under mineralized. There are vascular calcifications IMPRESSION: 1. No acute fracture or dislocation of the left hip. 2. Hardware in place without complication. Electronically Signed   By: Keith Rake M.D.   On: 10/27/2022 16:14    Procedures Procedures    Medications Ordered in ED Medications  sodium chloride 0.9 % bolus 1,000 mL (0 mLs Intravenous Stopped 10/26/2022 2230)  HYDROmorphone (DILAUDID) injection 0.5 mg (0.5 mg Intravenous Given 10/15/2022 1859)  ondansetron (ZOFRAN) injection 4 mg (4 mg Intravenous Given 11/01/2022 1857)  HYDROmorphone (DILAUDID) injection 0.5 mg (0.5 mg Intravenous Given 10/11/2022 1940)  HYDROmorphone (DILAUDID) injection 1 mg (1 mg Intravenous Given 10/29/2022 2237)    ED Course/ Medical Decision Making/ A&P Clinical Course as of 11/01/2022 2338  Tue Oct 24, 2022  2331 Spoke with Dr Tonita Cong, partial weight bearing to left leg given injury was likely 4 wks ago when she had recent fall.  [SG]    Clinical Course User Index [SG] Jeanell Sparrow, DO                           Medical Decision Making Amount and/or Complexity of Data Reviewed Radiology: ordered.  Risk Prescription drug management.   This patient presents to the ED with chief complaint(s) of hip pain, fall, weak with pertinent past medical history of as above which further complicates the presenting complaint. The complaint involves an extensive differential diagnosis and also carries with it a high risk of complications and morbidity.    The differential diagnosis includes but not limited to   Differential diagnosis includes but is not exclusive to musculoskeletal back pain, renal colic, urinary tract infection, pyelonephritis, intra-abdominal  causes of back pain, aortic aneurysm or dissection, cauda equina syndrome, sciatica, lumbar disc disease, thoracic disc disease, hip fx, msk, sprain, strain, deconditioning, etc. Serious etiologies were  considered.   The initial plan is to screening labs and xr ordered in triage, XR stable, will get CT, analgesia    Additional history obtained: Additional history obtained from family Records reviewed  prior ed visit, primary care documentation, prior labs/imaging   Independent labs interpretation:  The following labs were independently interpreted:  CMP with Cr improved from prior but is mildly worse from her baseline CBC stable  Independent visualization of imaging: - I independently visualized the following imaging with scope of interpretation limited to determining acute life threatening conditions related to emergency care: hip pelvis xr left, CT lumbar/hip/pelvis, which revealed XR wnl, CT acute compression fx at L4 w/ 20% retropulsion, will get MRI. She has typical sensation to her LE, NVI LE, no saddle paresthesias. CT also shows posterior acetabular wall fx.    Cardiac monitoring was reviewed and interpreted by myself which shows NSR  Treatment and Reassessment: IVF Dilaudid x3 Zofran >> improved  Consultation: - Consulted or discussed management/test interpretation w/ external professional: Dr Tonita Cong emerge ortho, limited WB to left, no emergent operative eval needed at this time.   Consideration for admission or further workup: Admission was considered   Pt with L4 compression fx, acetabular fx, unable to ambulate, intractable pain. Failed outpatient management. MRI is pending; signed out to incoming EDP at shift change Recommend admission. She is agreeable.     Social Determinants of health: Social History   Tobacco Use   Smoking status: Former    Packs/day: 0.25    Types: Cigarettes    Quit date: 12/04/1973    Years since quitting: 48.9   Smokeless tobacco: Never   Tobacco comments:    05/30/18 none in 40 years  Vaping Use   Vaping Use: Never used  Substance Use Topics   Alcohol use: No    Alcohol/week: 0.0 standard drinks of alcohol   Drug use: No             Final Clinical Impression(s) / ED Diagnoses Final diagnoses:  Closed compression fracture of L4 lumbar vertebra, initial encounter ([place])  Closed nondisplaced fracture of posterior wall of left acetabulum, initial encounter ([place])  Intractable back pain    Rx / DC Orders ED Discharge Orders     None         Jeanell Sparrow, DO 10/21/2022 2338

## 2022-10-24 NOTE — ED Provider Notes (Signed)
Care assumed from Dr. Pearline Cables, patient with recent (four weeks ago) acetabular fracture and lumbar compression fracture and unable to function at home. She is pending MRI of lumbar spine report, will likely need to be admitted.  MRI shows compression fracture of L4 with prolonged of the posterior cortex causing moderate spinal canal stenosis and mild left neuroforaminal narrowing at L4-L5.  The findings are concerning for possible pathologic fracture.  I have independently viewed the images and independently discussed the findings with the radiologist.  I agree with the radiologist's interpretation.  I have discussed the case with Dr. Claria Dice of [facility], who agrees to admit the patient.  Results for orders placed or performed during the hospital encounter of 10/27/2022  CBC  Result Value Ref Range   WBC 5.8 4.0 - 10.5 K/uL   RBC 4.54 3.87 - 5.11 MIL/uL   Hemoglobin 13.3 12.0 - 15.0 g/dL   HCT 40.8 36.0 - 46.0 %   MCV 89.9 80.0 - 100.0 fL   MCH 29.3 26.0 - 34.0 pg   MCHC 32.6 30.0 - 36.0 g/dL   RDW 14.6 11.5 - 15.5 %   Platelets 107 (L) 150 - 400 K/uL   nRBC 0.0 0.0 - 0.2 %  Comprehensive metabolic panel  Result Value Ref Range   Sodium 136 135 - 145 mmol/L   Potassium 3.6 3.5 - 5.1 mmol/L   Chloride 94 (L) 98 - 111 mmol/L   CO2 25 22 - 32 mmol/L   Glucose, Bld 294 (H) 70 - 99 mg/dL   BUN 57 (H) 8 - 23 mg/dL   Creatinine, Ser 1.94 (H) 0.44 - 1.00 mg/dL   Calcium 9.4 8.9 - 10.3 mg/dL   Total Protein 6.8 6.5 - 8.1 g/dL   Albumin 3.5 3.5 - 5.0 g/dL   AST 36 15 - 41 U/L   ALT 11 0 - 44 U/L   Alkaline Phosphatase 92 38 - 126 U/L   Total Bilirubin 1.2 0.3 - 1.2 mg/dL   GFR, Estimated 25 (L) >60 mL/min   Anion gap 17 (H) 5 - 15  CBG monitoring, ED  Result Value Ref Range   Glucose-Capillary 196 (H) 70 - 99 mg/dL   *Note: Due to a large number of results and/or encounters for the requested time period, some results have not been displayed. A complete set of results can be found in  Results Review.   MR LUMBAR SPINE WO CONTRAST  Result Date: 10/27/2022 CLINICAL DATA:  Low back pain, fracture on same-day CT EXAM: MRI LUMBAR SPINE WITHOUT CONTRAST TECHNIQUE: Multiplanar, multisequence MR imaging of the lumbar spine was performed. No intravenous contrast was administered. COMPARISON:  10/04/2022 CT lumbar spine, correlation is also made with 07/02/2019 CT abdomen pelvis FINDINGS: Segmentation:  5 lumbar type vertebral bodies. Alignment:  Mild levocurvature.  Trace anterolisthesis of L4 on L5. Vertebrae: Diffusely decreased T1 and diffusely increased T2 hyperintense signal throughout the L4 vertebral body but most prominent towards the posterior aspect, extending into the bilateral pedicles. Approximately 30% vertebral body height loss, with bowing of the posterior cortex, which extends approximately 7 mm into the spinal canal (series 6, image 8), concerning for pathologic fracture. Otherwise normal marrow signal and preserved vertebral body heights. Conus medullaris and cauda equina: Conus extends to the L1 level. Conus and cauda equina appear normal. Paraspinal and other soft tissues: Status post left nephrectomy. Right cortical atrophy and scarring. Atrophy of the paraspinous musculature. Disc levels: T12-L1: No significant disc bulge. No spinal canal  stenosis or neural foraminal narrowing. L1-L2: Minimal disc bulge with left foraminal protrusion. Mild facet arthropathy. No spinal canal stenosis or neural foraminal narrowing. L2-L3: Minimal disc bulge with left foraminal protrusion. Mild facet arthropathy. No spinal canal stenosis or neural foraminal narrowing. L3-L4: No significant disc bulge. Bowing of the posterior cortex of L4 causes moderate spinal canal stenosis just below the disc level. Narrowing of the lateral recesses. No significant neural foraminal narrowing. L4-L5: Minimal disc bulge. Moderate to severe facet arthropathy. No spinal canal stenosis. Mild left neural foraminal  narrowing. L5-S1: Minimal disc bulge. Mild right and moderate left facet arthropathy. Narrowing of the left lateral recess. No spinal canal stenosis. Mild right neural foraminal narrowing. IMPRESSION: 1. Findings concerning for pathologic fracture of the L4 vertebral body, with approximately 30% vertebral body height loss and bowing of the posterior cortex, which causes moderate spinal canal stenosis posterior to the L4 vertebral body, as well as mild left neural foraminal narrowing at L4-L5. Given the patient's history of renal cell carcinoma, this is concerning for metastasis. 2. L5-S1 mild right neural foraminal narrowing. Narrowing of the left lateral recess at this level could affect the descending left S1 nerve roots. These results were called by telephone at the time of interpretation on 10/17/2022 at 11:41 pm to provider Yarelis Ambrosino [facility], who verbally acknowledged these results. Electronically Signed   By: Merilyn Baba M.D.   On: 10/04/2022 23:41   CT Hip Left Wo Contrast  Result Date: 10/10/2022 CLINICAL DATA:  Hip trauma, fracture suspected. EXAM: CT OF THE LEFT HIP WITHOUT CONTRAST TECHNIQUE: Multidetector CT imaging of the left hip was performed according to the standard protocol. Multiplanar CT image reconstructions were also generated. RADIATION DOSE REDUCTION: This exam was performed according to the departmental dose-optimization program which includes automated exposure control, adjustment of the mA and/or kV according to patient size and/or use of iterative reconstruction technique. COMPARISON:  None Available. FINDINGS: Bones/Joint/Cartilage Status post left hip cephalomedullary nail placement. The hardware is intact. No perihardware fracture. There is a small fracture about the posterior acetabular wall of indeterminate age. Ligaments Suboptimally assessed by CT. Muscles and Tendons No intramuscular hematoma or fluid collection. Soft tissues Skin and subcutaneous soft tissues are within normal  limits. Prominent vascular calcifications. IMPRESSION: 1. Status post left hip cephalomedullary nail placement. The hardware is intact. No perihardware fracture. 2. Small fracture about the posterior acetabular wall of indeterminate age. 3. No intramuscular hematoma or fluid collection. Electronically Signed   By: Keane Police D.O.   On: 10/16/2022 21:05   CT Lumbar Spine Wo Contrast  Result Date: 10/12/2022 CLINICAL DATA:  Low back pain, trauma. EXAM: CT LUMBAR SPINE WITHOUT CONTRAST TECHNIQUE: Multidetector CT imaging of the lumbar spine was performed without intravenous contrast administration. Multiplanar CT image reconstructions were also generated. RADIATION DOSE REDUCTION: This exam was performed according to the departmental dose-optimization program which includes automated exposure control, adjustment of the mA and/or kV according to patient size and/or use of iterative reconstruction technique. COMPARISON:  Radiographs dated October 13, 2022 FINDINGS: Segmentation: 5 lumbar type vertebrae. Alignment: Grade 1 anterolisthesis of L4, chronic process. Vertebrae: Compression fracture of the L4 vertebral body. There is a proximally 20% vertebral body height loss. No significant retropulsion of the vertebral body into the spinal canal. Paraspinal and other soft tissues: Soft tissue swelling and hematoma about the vertebral body. Postsurgical changes for prior left nephrectomy. Advanced atherosclerotic disease of abdominal aorta and bilateral iliac vessels. Disc levels: Advanced multilevel degenerate disc disease  with associated facet joint arthropathy. T12-L1: No significant spinal canal or neural foraminal stenosis. L1-L2: Disc osteophyte complex with mild bilateral lateral recess stenosis. Mild facet joint arthropathy. No significant neural foraminal stenosis. L2-L3: Disc osteophyte complex with bilateral lateral recess stenosis. Facet joint arthropathy. No significant neural foraminal stenosis. L3-L4:  Mild retropulsion of the superior endplate of the L4 vertebral body into the spinal canal. Moderate bilateral lateral recess stenosis. Mild bilateral neural foraminal stenosis. L4-L5: Disc osteophyte complex with bilateral lateral recess stenosis. Mild to moderate bilateral neural foraminal stenosis, left worse than the right. Moderate bilateral facet joint arthropathy. L5-S1: Disc osteophyte complex with moderate lateral recess stenosis. Moderate-to-severe bilateral facet joint arthropathy. IMPRESSION: 1. Acute compression fracture of the L4 vertebral body with approximately 20% vertebral body height loss. Mild retropulsion of the superior endplate into the spinal canal. Further evaluation with MR examination is suggested. 2. Advanced multilevel degenerate disc disease and facet joint arthropathy as described above. 3. Aortic atherosclerosis. Aortic Atherosclerosis (ICD10-I70.0). Electronically Signed   By: Keane Police D.O.   On: 10/27/2022 20:56   CT PELVIS WO CONTRAST  Result Date: 10/19/2022 CLINICAL DATA:  Hip trauma, fracture suspected.  Low back pain. EXAM: CT PELVIS WITHOUT CONTRAST TECHNIQUE: Multidetector CT imaging of the pelvis was performed following the standard protocol without intravenous contrast. RADIATION DOSE REDUCTION: This exam was performed according to the departmental dose-optimization program which includes automated exposure control, adjustment of the mA and/or kV according to patient size and/or use of iterative reconstruction technique. COMPARISON:  10/16/2022. FINDINGS: Urinary Tract:  No abnormality visualized. Bowel:  No acute abnormality. Vascular/Lymphatic: Atherosclerotic calcification of the distal abdominal aorta and its tributaries. No abdominal or pelvic lymphadenopathy. Reproductive:  Hysterectomy changes are noted. Other: A small amount of free fluid is present in the pelvis. Small fat containing umbilical hernia. Musculoskeletal: No acute fracture or dislocation at the  left hip. Fixation hardware is present in the proximal left femur without evidence of hardware loosening. A compression fracture is noted in the superior endplate of L4, partially visualized on exam. Degenerative changes are noted in the lower lumbar spine. IMPRESSION: 1. No acute fracture or dislocation at the left hip. 2. Compression deformity in the superior endplate of L4, partially visualized on exam. Please refer to CT lumbar spine for additional information. 3. Aortic atherosclerosis. Electronically Signed   By: Brett Fairy M.D.   On: 10/21/2022 20:44   DG Hip Unilat W or Wo Pelvis 2-3 Views Left  Result Date: 10/22/2022 CLINICAL DATA:  Left hip pain. Weakness. Fall 1 month ago. EXAM: DG HIP (WITH OR WITHOUT PELVIS) 2-3V LEFT COMPARISON:  None Available. FINDINGS: Intramedullary nail with trans trochanteric and distal locking screw fixation of remote proximal femur fracture. There is no periprosthetic fracture or lucency. Femoral head is well seated, no dislocation. Intact pubic rami. Pubic symphysis and sacroiliac joints are congruent. The bones are under mineralized. There are vascular calcifications IMPRESSION: 1. No acute fracture or dislocation of the left hip. 2. Hardware in place without complication. Electronically Signed   By: Keith Rake M.D.   On: 10/05/2022 16:14   DG Lumbar Spine Complete  Result Date: 10/13/2022 CLINICAL DATA:  Fall in bathtub with lower back pain EXAM: [place] 4+ VIEW COMPARISON:  Radiographs 09/27/2009 FINDINGS: Demineralization. Grade 1 anterolisthesis of L4 is new since 2010 but is presumed chronic. Vertebral body height is maintained. Mild multilevel spondylosis. Mild disc space height loss at L3-L4 and L4-L5. Moderate lower lumbar facet  arthropathy. Calcified splenic artery and aorta.  Surgical clips in the abdomen. IMPRESSION: Demineralization.  No definite acute fracture. Grade 1 anterolisthesis of L4 on L5. Electronically Signed   By:  Placido Sou M.D.   On: 60/09/9322 55:73      Margaret Fuel, MD [birth year]54 [phone]

## 2022-10-24 NOTE — ED Notes (Signed)
The daug. Margaret Dyer will be the contact person and she informed the sister cheryl that she would keep her up to date about the patient care please call teresa with an update any time  her number is [phone]

## 2022-10-24 NOTE — ED Provider Triage Note (Signed)
Emergency Medicine Provider Triage Evaluation Note  Margaret Dyer , a 87 y.o. female  was evaluated in triage.  Pt complains of weakness and hip pain.  Patient states she had a hip surgery in 2020, her hip has been hurting since, got worse in the last 3 to 4 months.  Per EMS patient had a fall a month ago into the bathtub.  Per family patient is not altered, just lethargic.  Patient is ambulatory with a walker.  Patient has tried tramadol with no relief.  Endorses shortness of breath and weakness patient is wearing oxygen at baseline.  Denies chest pain, nausea, vomiting, bowel changes, urinary symptoms, fever.  Review of Systems  Positive: As above Negative: As above  Physical Exam  BP (!) 160/58 (BP Location: Right Arm)   Pulse 61   Temp (!) 97.5 F (36.4 C)   Resp 15   Ht _0  (1.6 m)   Wt 83 kg   LMP  (LMP Unknown)   SpO2 98%   BMI 32.41 kg/m  Gen:   Awake, no distress   Resp:  Normal effort  MSK:   Moves extremities without difficulty  Other:  TTP to left hip  Medical Decision Making  Medically screening exam initiated at 3:17 PM.  Appropriate orders placed.  Margaret Dyer was informed that the remainder of the evaluation will be completed by another provider, this initial triage assessment does not replace that evaluation, and the importance of remaining in the ED until their evaluation is complete.     Margaret Dyer, Margaret Dyer

## 2022-10-24 NOTE — ED Triage Notes (Signed)
Pt arrives via EMS from kidney doc with weakness and continued hip pain. Pt had a fall a month ago into the bathtub. Per family, pt is not altered, just lethargic and hip pain.

## 2022-10-24 NOTE — Telephone Encounter (Signed)
I have called the pt daughter and relayed the message from Dr. Etter Sjogren and Mickel Baas with the results of her urine sample.

## 2022-10-24 NOTE — ED Notes (Signed)
Daughter Leslie Dales [phone] would like an update asap

## 2022-10-25 ENCOUNTER — Other Ambulatory Visit: Payer: Self-pay

## 2022-10-25 ENCOUNTER — Inpatient Hospital Stay (HOSPITAL_COMMUNITY): Payer: Medicare Other

## 2022-10-25 ENCOUNTER — Ambulatory Visit: Payer: Medicare Other

## 2022-10-25 DIAGNOSIS — M549 Dorsalgia, unspecified: Secondary | ICD-10-CM

## 2022-10-25 DIAGNOSIS — Z905 Acquired absence of kidney: Secondary | ICD-10-CM

## 2022-10-25 DIAGNOSIS — R933 Abnormal findings on diagnostic imaging of other parts of digestive tract: Secondary | ICD-10-CM | POA: Diagnosis not present

## 2022-10-25 DIAGNOSIS — S32425A Nondisplaced fracture of posterior wall of left acetabulum, initial encounter for closed fracture: Secondary | ICD-10-CM

## 2022-10-25 DIAGNOSIS — Z510 Encounter for antineoplastic radiation therapy: Secondary | ICD-10-CM | POA: Diagnosis not present

## 2022-10-25 DIAGNOSIS — K769 Liver disease, unspecified: Secondary | ICD-10-CM | POA: Diagnosis not present

## 2022-10-25 DIAGNOSIS — I2729 Other secondary pulmonary hypertension: Secondary | ICD-10-CM | POA: Diagnosis present

## 2022-10-25 DIAGNOSIS — Z789 Other specified health status: Secondary | ICD-10-CM | POA: Diagnosis not present

## 2022-10-25 DIAGNOSIS — C220 Liver cell carcinoma: Secondary | ICD-10-CM | POA: Diagnosis present

## 2022-10-25 DIAGNOSIS — C7951 Secondary malignant neoplasm of bone: Secondary | ICD-10-CM | POA: Diagnosis not present

## 2022-10-25 DIAGNOSIS — F32A Depression, unspecified: Secondary | ICD-10-CM | POA: Diagnosis present

## 2022-10-25 DIAGNOSIS — C7802 Secondary malignant neoplasm of left lung: Secondary | ICD-10-CM | POA: Diagnosis present

## 2022-10-25 DIAGNOSIS — I5032 Chronic diastolic (congestive) heart failure: Secondary | ICD-10-CM | POA: Diagnosis not present

## 2022-10-25 DIAGNOSIS — B370 Candidal stomatitis: Secondary | ICD-10-CM | POA: Diagnosis not present

## 2022-10-25 DIAGNOSIS — C649 Malignant neoplasm of unspecified kidney, except renal pelvis: Secondary | ICD-10-CM | POA: Diagnosis not present

## 2022-10-25 DIAGNOSIS — I132 Hypertensive heart and chronic kidney disease with heart failure and with stage 5 chronic kidney disease, or end stage renal disease: Secondary | ICD-10-CM | POA: Diagnosis present

## 2022-10-25 DIAGNOSIS — J90 Pleural effusion, not elsewhere classified: Secondary | ICD-10-CM | POA: Diagnosis present

## 2022-10-25 DIAGNOSIS — K5900 Constipation, unspecified: Secondary | ICD-10-CM | POA: Diagnosis not present

## 2022-10-25 DIAGNOSIS — N1832 Chronic kidney disease, stage 3b: Secondary | ICD-10-CM | POA: Diagnosis not present

## 2022-10-25 DIAGNOSIS — R160 Hepatomegaly, not elsewhere classified: Secondary | ICD-10-CM | POA: Diagnosis not present

## 2022-10-25 DIAGNOSIS — J811 Chronic pulmonary edema: Secondary | ICD-10-CM | POA: Diagnosis not present

## 2022-10-25 DIAGNOSIS — Z794 Long term (current) use of insulin: Secondary | ICD-10-CM | POA: Diagnosis not present

## 2022-10-25 DIAGNOSIS — R109 Unspecified abdominal pain: Secondary | ICD-10-CM | POA: Diagnosis not present

## 2022-10-25 DIAGNOSIS — R7881 Bacteremia: Secondary | ICD-10-CM | POA: Diagnosis not present

## 2022-10-25 DIAGNOSIS — Z7189 Other specified counseling: Secondary | ICD-10-CM | POA: Diagnosis not present

## 2022-10-25 DIAGNOSIS — J9621 Acute and chronic respiratory failure with hypoxia: Secondary | ICD-10-CM | POA: Diagnosis not present

## 2022-10-25 DIAGNOSIS — I700 Atherosclerosis of aorta: Secondary | ICD-10-CM | POA: Diagnosis not present

## 2022-10-25 DIAGNOSIS — Z452 Encounter for adjustment and management of vascular access device: Secondary | ICD-10-CM | POA: Diagnosis not present

## 2022-10-25 DIAGNOSIS — J9611 Chronic respiratory failure with hypoxia: Secondary | ICD-10-CM | POA: Diagnosis not present

## 2022-10-25 DIAGNOSIS — E119 Type 2 diabetes mellitus without complications: Secondary | ICD-10-CM

## 2022-10-25 DIAGNOSIS — M8000XD Age-related osteoporosis with current pathological fracture, unspecified site, subsequent encounter for fracture with routine healing: Secondary | ICD-10-CM | POA: Diagnosis not present

## 2022-10-25 DIAGNOSIS — N185 Chronic kidney disease, stage 5: Secondary | ICD-10-CM | POA: Diagnosis present

## 2022-10-25 DIAGNOSIS — E039 Hypothyroidism, unspecified: Secondary | ICD-10-CM | POA: Diagnosis present

## 2022-10-25 DIAGNOSIS — R52 Pain, unspecified: Secondary | ICD-10-CM | POA: Diagnosis present

## 2022-10-25 DIAGNOSIS — I270 Primary pulmonary hypertension: Secondary | ICD-10-CM | POA: Diagnosis not present

## 2022-10-25 DIAGNOSIS — D631 Anemia in chronic kidney disease: Secondary | ICD-10-CM | POA: Diagnosis present

## 2022-10-25 DIAGNOSIS — Z515 Encounter for palliative care: Secondary | ICD-10-CM | POA: Diagnosis not present

## 2022-10-25 DIAGNOSIS — J9811 Atelectasis: Secondary | ICD-10-CM | POA: Diagnosis not present

## 2022-10-25 DIAGNOSIS — N184 Chronic kidney disease, stage 4 (severe): Secondary | ICD-10-CM | POA: Diagnosis not present

## 2022-10-25 DIAGNOSIS — J341 Cyst and mucocele of nose and nasal sinus: Secondary | ICD-10-CM | POA: Diagnosis not present

## 2022-10-25 DIAGNOSIS — R0602 Shortness of breath: Secondary | ICD-10-CM | POA: Diagnosis not present

## 2022-10-25 DIAGNOSIS — R918 Other nonspecific abnormal finding of lung field: Secondary | ICD-10-CM | POA: Diagnosis not present

## 2022-10-25 DIAGNOSIS — K828 Other specified diseases of gallbladder: Secondary | ICD-10-CM | POA: Diagnosis not present

## 2022-10-25 DIAGNOSIS — S32040A Wedge compression fracture of fourth lumbar vertebra, initial encounter for closed fracture: Secondary | ICD-10-CM

## 2022-10-25 DIAGNOSIS — R911 Solitary pulmonary nodule: Secondary | ICD-10-CM | POA: Diagnosis not present

## 2022-10-25 DIAGNOSIS — R188 Other ascites: Secondary | ICD-10-CM | POA: Diagnosis present

## 2022-10-25 DIAGNOSIS — K8020 Calculus of gallbladder without cholecystitis without obstruction: Secondary | ICD-10-CM | POA: Diagnosis not present

## 2022-10-25 DIAGNOSIS — C7801 Secondary malignant neoplasm of right lung: Secondary | ICD-10-CM | POA: Diagnosis present

## 2022-10-25 DIAGNOSIS — K7460 Unspecified cirrhosis of liver: Secondary | ICD-10-CM | POA: Diagnosis present

## 2022-10-25 DIAGNOSIS — D731 Hypersplenism: Secondary | ICD-10-CM | POA: Diagnosis not present

## 2022-10-25 DIAGNOSIS — F419 Anxiety disorder, unspecified: Secondary | ICD-10-CM

## 2022-10-25 DIAGNOSIS — Z6841 Body Mass Index (BMI) 40.0 and over, adult: Secondary | ICD-10-CM | POA: Diagnosis not present

## 2022-10-25 DIAGNOSIS — R059 Cough, unspecified: Secondary | ICD-10-CM | POA: Diagnosis not present

## 2022-10-25 DIAGNOSIS — Y92002 Bathroom of unspecified non-institutional (private) residence single-family (private) house as the place of occurrence of the external cause: Secondary | ICD-10-CM | POA: Diagnosis not present

## 2022-10-25 DIAGNOSIS — N2889 Other specified disorders of kidney and ureter: Secondary | ICD-10-CM | POA: Diagnosis not present

## 2022-10-25 DIAGNOSIS — E11649 Type 2 diabetes mellitus with hypoglycemia without coma: Secondary | ICD-10-CM | POA: Diagnosis not present

## 2022-10-25 DIAGNOSIS — W010XXA Fall on same level from slipping, tripping and stumbling without subsequent striking against object, initial encounter: Secondary | ICD-10-CM | POA: Diagnosis present

## 2022-10-25 DIAGNOSIS — C7800 Secondary malignant neoplasm of unspecified lung: Secondary | ICD-10-CM | POA: Diagnosis not present

## 2022-10-25 DIAGNOSIS — J189 Pneumonia, unspecified organism: Secondary | ICD-10-CM | POA: Diagnosis not present

## 2022-10-25 DIAGNOSIS — G9340 Encephalopathy, unspecified: Secondary | ICD-10-CM | POA: Diagnosis not present

## 2022-10-25 DIAGNOSIS — M8440XA Pathological fracture, unspecified site, initial encounter for fracture: Secondary | ICD-10-CM | POA: Diagnosis present

## 2022-10-25 DIAGNOSIS — K7581 Nonalcoholic steatohepatitis (NASH): Secondary | ICD-10-CM | POA: Diagnosis not present

## 2022-10-25 DIAGNOSIS — C642 Malignant neoplasm of left kidney, except renal pelvis: Secondary | ICD-10-CM | POA: Diagnosis not present

## 2022-10-25 DIAGNOSIS — Z0189 Encounter for other specified special examinations: Secondary | ICD-10-CM | POA: Diagnosis not present

## 2022-10-25 DIAGNOSIS — Z66 Do not resuscitate: Secondary | ICD-10-CM | POA: Diagnosis not present

## 2022-10-25 DIAGNOSIS — D696 Thrombocytopenia, unspecified: Secondary | ICD-10-CM | POA: Diagnosis present

## 2022-10-25 DIAGNOSIS — J440 Chronic obstructive pulmonary disease with acute lower respiratory infection: Secondary | ICD-10-CM | POA: Diagnosis not present

## 2022-10-25 DIAGNOSIS — Z23 Encounter for immunization: Secondary | ICD-10-CM | POA: Diagnosis present

## 2022-10-25 DIAGNOSIS — K5641 Fecal impaction: Secondary | ICD-10-CM | POA: Diagnosis not present

## 2022-10-25 LAB — CBC
HCT: 40.6 % (ref 36.0–46.0)
Hemoglobin: 12.7 g/dL (ref 12.0–15.0)
MCH: 28.9 pg (ref 26.0–34.0)
MCHC: 31.3 g/dL (ref 30.0–36.0)
MCV: 92.5 fL (ref 80.0–100.0)
Platelets: 93 10*3/uL — ABNORMAL LOW (ref 150–400)
RBC: 4.39 MIL/uL (ref 3.87–5.11)
RDW: 15.1 % (ref 11.5–15.5)
WBC: 6.9 10*3/uL (ref 4.0–10.5)
nRBC: 0 % (ref 0.0–0.2)

## 2022-10-25 LAB — URINALYSIS, ROUTINE W REFLEX MICROSCOPIC
Bilirubin Urine: NEGATIVE
Glucose, UA: 50 mg/dL — AB
Hgb urine dipstick: NEGATIVE
Ketones, ur: NEGATIVE mg/dL
Nitrite: NEGATIVE
Protein, ur: NEGATIVE mg/dL
Specific Gravity, Urine: 1.013 (ref 1.005–1.030)
pH: 5 (ref 5.0–8.0)

## 2022-10-25 LAB — CREATININE, SERUM
Creatinine, Ser: 1.82 mg/dL — ABNORMAL HIGH (ref 0.44–1.00)
GFR, Estimated: 27 mL/min — ABNORMAL LOW (ref >60)

## 2022-10-25 LAB — BASIC METABOLIC PANEL
Anion gap: 13 (ref 5–15)
BUN: 54 mg/dL — ABNORMAL HIGH (ref 8–23)
CO2: 24 mmol/L (ref 22–32)
Calcium: 8.4 mg/dL — ABNORMAL LOW (ref 8.9–10.3)
Chloride: 101 mmol/L (ref 98–111)
Creatinine, Ser: 1.73 mg/dL — ABNORMAL HIGH (ref 0.44–1.00)
GFR, Estimated: 29 mL/min — ABNORMAL LOW (ref >60)
Glucose, Bld: 200 mg/dL — ABNORMAL HIGH (ref 70–99)
Potassium: 4 mmol/L (ref 3.5–5.1)
Sodium: 138 mmol/L (ref 135–145)

## 2022-10-25 LAB — HEMOGLOBIN A1C
Hgb A1c MFr Bld: 6.4 % — ABNORMAL HIGH (ref 4.8–5.6)
Mean Plasma Glucose: 136.98 mg/dL

## 2022-10-25 LAB — PROTIME-INR
INR: 1.3 — ABNORMAL HIGH (ref 0.8–1.2)
Prothrombin Time: 16.5 seconds — ABNORMAL HIGH (ref 11.4–15.2)

## 2022-10-25 LAB — CBG MONITORING, ED
Glucose-Capillary: 186 mg/dL — ABNORMAL HIGH (ref 70–99)
Glucose-Capillary: 196 mg/dL — ABNORMAL HIGH (ref 70–99)
Glucose-Capillary: 99 mg/dL (ref 70–99)

## 2022-10-25 LAB — GLUCOSE, CAPILLARY: Glucose-Capillary: 123 mg/dL — ABNORMAL HIGH (ref 70–99)

## 2022-10-25 LAB — MAGNESIUM: Magnesium: 2.2 mg/dL (ref 1.7–2.4)

## 2022-10-25 MED ORDER — HYDRALAZINE HCL 50 MG PO TABS
100.0000 mg | ORAL_TABLET | Freq: Three times a day (TID) | ORAL | Status: DC
  Administered 2022-10-25 – 2022-10-29 (×11): 100 mg via ORAL
  Filled 2022-10-25 (×16): qty 2

## 2022-10-25 MED ORDER — EZETIMIBE 10 MG PO TABS
10.0000 mg | ORAL_TABLET | Freq: Every day | ORAL | Status: DC
  Administered 2022-10-25 – 2022-11-09 (×16): 10 mg via ORAL
  Filled 2022-10-25 (×18): qty 1

## 2022-10-25 MED ORDER — SIMVASTATIN 20 MG PO TABS
20.0000 mg | ORAL_TABLET | Freq: Every day | ORAL | Status: DC
  Administered 2022-10-25 – 2022-11-09 (×16): 20 mg via ORAL
  Filled 2022-10-25 (×16): qty 1

## 2022-10-25 MED ORDER — ACETAMINOPHEN 500 MG PO TABS
1000.0000 mg | ORAL_TABLET | Freq: Four times a day (QID) | ORAL | Status: DC
  Administered 2022-10-25 – 2022-11-09 (×58): 1000 mg via ORAL
  Filled 2022-10-25 (×61): qty 2

## 2022-10-25 MED ORDER — INSULIN ASPART 100 UNIT/ML IJ SOLN
0.0000 [IU] | Freq: Three times a day (TID) | INTRAMUSCULAR | Status: DC
  Administered 2022-10-25: 2 [IU] via SUBCUTANEOUS
  Administered 2022-10-26: 1 [IU] via SUBCUTANEOUS
  Administered 2022-10-26: 2 [IU] via SUBCUTANEOUS
  Administered 2022-10-26: 1 [IU] via SUBCUTANEOUS
  Administered 2022-10-27: 2 [IU] via SUBCUTANEOUS
  Administered 2022-10-27: 1 [IU] via SUBCUTANEOUS
  Administered 2022-10-28 (×2): 2 [IU] via SUBCUTANEOUS
  Administered 2022-10-29: 1 [IU] via SUBCUTANEOUS
  Administered 2022-10-29 – 2022-10-30 (×4): 2 [IU] via SUBCUTANEOUS
  Administered 2022-10-31 (×2): 1 [IU] via SUBCUTANEOUS
  Administered 2022-10-31: 2 [IU] via SUBCUTANEOUS
  Administered 2022-11-01 (×2): 1 [IU] via SUBCUTANEOUS
  Administered 2022-11-01 – 2022-11-02 (×3): 2 [IU] via SUBCUTANEOUS
  Administered 2022-11-02: 1 [IU] via SUBCUTANEOUS
  Administered 2022-11-03 – 2022-11-05 (×4): 2 [IU] via SUBCUTANEOUS
  Administered 2022-11-05: 3 [IU] via SUBCUTANEOUS
  Administered 2022-11-06: 1 [IU] via SUBCUTANEOUS
  Administered 2022-11-06: 3 [IU] via SUBCUTANEOUS
  Administered 2022-11-06: 7 [IU] via SUBCUTANEOUS
  Administered 2022-11-07: 3 [IU] via SUBCUTANEOUS
  Administered 2022-11-07 – 2022-11-08 (×4): 5 [IU] via SUBCUTANEOUS
  Administered 2022-11-08 – 2022-11-09 (×2): 3 [IU] via SUBCUTANEOUS
  Administered 2022-11-09: 2 [IU] via SUBCUTANEOUS
  Administered 2022-11-10: 1 [IU] via SUBCUTANEOUS
  Administered 2022-11-10 – 2022-11-11 (×3): 2 [IU] via SUBCUTANEOUS
  Administered 2022-11-12: 5 [IU] via SUBCUTANEOUS
  Administered 2022-11-12 – 2022-11-13 (×3): 2 [IU] via SUBCUTANEOUS
  Administered 2022-11-13: 5 [IU] via SUBCUTANEOUS
  Administered 2022-11-13: 1 [IU] via SUBCUTANEOUS
  Administered 2022-11-14: 2 [IU] via SUBCUTANEOUS
  Administered 2022-11-14: 3 [IU] via SUBCUTANEOUS
  Administered 2022-11-14: 5 [IU] via SUBCUTANEOUS
  Administered 2022-11-15: 2 [IU] via SUBCUTANEOUS
  Administered 2022-11-16: 1 [IU] via SUBCUTANEOUS
  Administered 2022-11-16: 2 [IU] via SUBCUTANEOUS
  Administered 2022-11-16: 7 [IU] via SUBCUTANEOUS
  Administered 2022-11-17: 3 [IU] via SUBCUTANEOUS
  Administered 2022-11-17: 1 [IU] via SUBCUTANEOUS
  Administered 2022-11-18 (×3): 2 [IU] via SUBCUTANEOUS
  Administered 2022-11-19: 3 [IU] via SUBCUTANEOUS
  Administered 2022-11-19: 2 [IU] via SUBCUTANEOUS
  Administered 2022-11-20: 3 [IU] via SUBCUTANEOUS
  Administered 2022-11-20 – 2022-11-21 (×3): 2 [IU] via SUBCUTANEOUS
  Administered 2022-11-22 – 2022-11-23 (×4): 1 [IU] via SUBCUTANEOUS

## 2022-10-25 MED ORDER — INSULIN GLARGINE-YFGN 100 UNIT/ML ~~LOC~~ SOLN
10.0000 [IU] | Freq: Every day | SUBCUTANEOUS | Status: DC
  Administered 2022-10-25 – 2022-10-28 (×4): 10 [IU] via SUBCUTANEOUS
  Filled 2022-10-25 (×8): qty 0.1

## 2022-10-25 MED ORDER — EZETIMIBE-SIMVASTATIN 10-20 MG PO TABS: 1.0000 | ORAL_TABLET | Freq: Every day | ORAL | Status: DC

## 2022-10-25 MED ORDER — LEVOTHYROXINE SODIUM 25 MCG PO TABS
25.0000 ug | ORAL_TABLET | ORAL | Status: DC
  Administered 2022-10-25 – 2022-11-23 (×21): 25 ug via ORAL
  Filled 2022-10-25 (×21): qty 1

## 2022-10-25 MED ORDER — INSULIN ASPART 100 UNIT/ML IJ SOLN
0.0000 [IU] | Freq: Every day | INTRAMUSCULAR | Status: DC
  Administered 2022-11-06: 3 [IU] via SUBCUTANEOUS
  Administered 2022-11-07 – 2022-11-17 (×5): 2 [IU] via SUBCUTANEOUS
  Administered 2022-11-18: 3 [IU] via SUBCUTANEOUS

## 2022-10-25 MED ORDER — LIDOCAINE 5 % EX PTCH
1.0000 | MEDICATED_PATCH | Freq: Every day | CUTANEOUS | Status: DC
  Administered 2022-10-25 – 2022-11-17 (×21): 1 via TRANSDERMAL
  Filled 2022-10-25 (×28): qty 1

## 2022-10-25 MED ORDER — FENTANYL 25 MCG/HR TD PT72
1.0000 | MEDICATED_PATCH | TRANSDERMAL | Status: DC
  Administered 2022-10-28 – 2022-11-21 (×9): 1 via TRANSDERMAL
  Filled 2022-10-25 (×14): qty 1

## 2022-10-25 MED ORDER — HYDROMORPHONE HCL 1 MG/ML IJ SOLN
1.0000 mg | INTRAMUSCULAR | Status: DC | PRN
  Administered 2022-10-25 – 2022-10-30 (×11): 1 mg via INTRAVENOUS
  Filled 2022-10-25 (×13): qty 1

## 2022-10-25 MED ORDER — OXYBUTYNIN CHLORIDE 5 MG PO TABS
5.0000 mg | ORAL_TABLET | Freq: Every day | ORAL | Status: DC
  Administered 2022-10-25 – 2022-11-22 (×29): 5 mg via ORAL
  Filled 2022-10-25 (×30): qty 1

## 2022-10-25 MED ORDER — SENNOSIDES-DOCUSATE SODIUM 8.6-50 MG PO TABS: 1.0000 | ORAL_TABLET | Freq: Every evening | ORAL | Status: DC | PRN

## 2022-10-25 MED ORDER — SODIUM CHLORIDE 0.9 % IV SOLN: INTRAVENOUS | Status: DC

## 2022-10-25 MED ORDER — GADOBUTROL 1 MMOL/ML IV SOLN
8.5000 mL | Freq: Once | INTRAVENOUS | Status: AC | PRN
  Administered 2022-10-25: 8.5 mL via INTRAVENOUS

## 2022-10-25 MED ORDER — TRAMADOL HCL 50 MG PO TABS
50.0000 mg | ORAL_TABLET | Freq: Four times a day (QID) | ORAL | Status: DC | PRN
  Administered 2022-10-25 – 2022-10-26 (×2): 50 mg via ORAL
  Filled 2022-10-25 (×2): qty 1

## 2022-10-25 MED ORDER — SODIUM CHLORIDE 0.9 % IV SOLN
1.0000 g | INTRAVENOUS | Status: DC
  Administered 2022-10-25 – 2022-10-27 (×3): 1 g via INTRAVENOUS
  Filled 2022-10-25 (×5): qty 10

## 2022-10-25 MED ORDER — IPRATROPIUM-ALBUTEROL 0.5-2.5 (3) MG/3ML IN SOLN
3.0000 mL | Freq: Three times a day (TID) | RESPIRATORY_TRACT | Status: DC
  Administered 2022-10-25 – 2022-10-26 (×6): 3 mL via RESPIRATORY_TRACT
  Filled 2022-10-25 (×6): qty 3

## 2022-10-25 MED ORDER — FENTANYL 25 MCG/HR TD PT72
1.0000 | MEDICATED_PATCH | Freq: Once | TRANSDERMAL | Status: AC
  Administered 2022-10-25: 1 via TRANSDERMAL
  Filled 2022-10-25: qty 1

## 2022-10-25 MED ORDER — ONDANSETRON HCL 4 MG/2ML IJ SOLN
4.0000 mg | Freq: Four times a day (QID) | INTRAMUSCULAR | Status: DC | PRN
  Administered 2022-10-25 – 2022-10-27 (×5): 4 mg via INTRAVENOUS
  Filled 2022-10-25 (×5): qty 2

## 2022-10-25 MED ORDER — CARVEDILOL 12.5 MG PO TABS
12.5000 mg | ORAL_TABLET | Freq: Two times a day (BID) | ORAL | Status: DC
  Administered 2022-10-25 – 2022-11-20 (×52): 12.5 mg via ORAL
  Filled 2022-10-25 (×54): qty 1

## 2022-10-25 MED ORDER — HEPARIN SODIUM (PORCINE) 5000 UNIT/ML IJ SOLN
5000.0000 [IU] | Freq: Three times a day (TID) | INTRAMUSCULAR | Status: DC
  Administered 2022-10-25 – 2022-11-23 (×74): 5000 [IU] via SUBCUTANEOUS
  Filled 2022-10-25 (×79): qty 1

## 2022-10-25 MED ORDER — ASPIRIN 81 MG PO TBEC
81.0000 mg | DELAYED_RELEASE_TABLET | Freq: Every day | ORAL | Status: DC
  Administered 2022-10-25: 81 mg via ORAL
  Filled 2022-10-25: qty 1

## 2022-10-25 NOTE — H&P (Addendum)
PCP:   Ann Held, DO   Chief Complaint: Intractable back pain   HPI: This is a 87 year old female who lives at home with her daughter.  She has a past medical history of chronic respiratory failure on 3.5 L oxygen at baseline, renal cell carcinoma, liver cirrhosis, brittle diabetes.  At baseline she uses a cane.  Per patient approximately 3 years ago she fell and broke her hip and had an ORIF done.  Since then she has had mild pain on and off however, 3 or so months ago while in the bathroom she had an accidental slip and fall.  Per patient it has been hurting constantly since, now becoming severe and unbearable.  Pain is in her low back and radiates down her left leg almost to her knee.  She follows with her PCP Dr Etter Sjogren who has ordered her tramadol.  This worked somewhat initially but now no longer works.  Per patient she has not been to patient management as she had done so once before and the distant past and she did not like anything that that was done.  Per patient she has been incontinent of urine approximately a year, this is because she cannot get there in time.  She states became incontinent of stool 3 weeks ago.  This is since resolved.  Of note patient has a solitary kidney.  Her daughter brought her to the ER.  In the ER imaging including MRI LS spine done revealed L4 fracture that is concerning for pathologic fracture.  Patient has a history of renal cell carcinoma.  Hospitalist consult placed for workup of possible renal cell mets t  Review of Systems:  The patient denies anorexia, fever, weight loss,, vision loss, decreased hearing, hoarseness, chest pain, syncope, dyspnea on exertion, peripheral edema, balance deficits, hemoptysis, abdominal pain, melena, hematochezia, severe indigestion/heartburn, hematuria, incontinence, genital sores, muscle weakness, suspicious skin lesions, transient blindness, depression, unusual weight change, abnormal bleeding, enlarged lymph nodes,  angioedema, and breast masses. Positive: Severe low back pain, nausea, gait difficulty, diarrhea, chronic shortness of breath, blood incontinence, constant knee pain  Past Medical History: Past Medical History:  Diagnosis Date   Asthma    Blood transfusion    Chronic diastolic CHF (congestive heart failure) (HCC)    CKD (chronic kidney disease), stage III (HCC)    Coronary artery calcification seen on CT scan    Diabetes mellitus    Erythropoietin deficiency anemia 09/19/2021   Goiter    Hyperlipidemia    Hypertension    Hypothyroidism    Iron deficiency anemia due to chronic blood loss 09/19/2021   Liver cirrhosis (HCC)    NASH (nonalcoholic steatohepatitis)    Obesity    Osteopenia    Pulmonary hypertension (HCC)    Renal cell carcinoma    a. prior nephrectomy.   Right heart failure (HCC)    Thrombocytopenia ([place])    Past Surgical History:  Procedure Laterality Date   ABDOMINAL HYSTERECTOMY     APPENDECTOMY     HEMORRHOID SURGERY     INTRAMEDULLARY (IM) NAIL INTERTROCHANTERIC Left 12/25/2018   Procedure: INTRAMEDULLARY (IM) NAIL INTERTROCHANTRIC;  Surgeon: Nicholes Stairs, MD;  Location: [facility];  Service: Orthopedics;  Laterality: Left;   KNEE SURGERY     NEPHRECTOMY  09.2000    Medications: Prior to Admission medications   Medication Sig Start Date End Date Taking? Authorizing Provider  ACCU-CHEK GUIDE test strip CHECK BLOOD GLUCOSE FOUR TIMES DAILY 08/05/21   [provider]  Accu-Chek Softclix Lancets lancets Check BG 4 times/day.  Dx E11.65 08/03/21   [provider]  albuterol (PROVENTIL) (2.5 MG/3ML) 0.083% nebulizer solution Take 3 mLs (2.5 mg total) by nebulization every 6 (six) hours as needed for wheezing or shortness of breath. 06/09/21   Roma Schanz R, DO  amoxicillin-clavulanate (AUGMENTIN) 250-125 MG tablet Take 1 tablet by mouth 2 (two) times daily for 7 days. 10/11/2022 10/31/22  Marrian Salvage, FNP  aspirin EC 81 MG EC  tablet Take 1 tablet (81 mg total) by mouth daily. 07/07/19   Geradine Girt, DO  carvedilol (COREG) 12.5 MG tablet Take 1 tablet (12.5 mg total) by mouth 2 (two) times daily. 01/26/22 10/14/23  Pixie Casino, MD  docusate sodium (COLACE) 100 MG capsule Take 1 capsule (100 mg total) by mouth 2 (two) times daily. 12/30/18   Bonnell Public, MD  ezetimibe-simvastatin (VYTORIN) 10-20 MG tablet Take 1 tablet by mouth daily. 07/21/22   Ann Held, DO  hydrALAZINE (APRESOLINE) 100 MG tablet Take 1 tablet (100 mg total) by mouth 3 (three) times daily. 07/17/22   Skeet Latch, MD  insulin glargine (LANTUS) 100 UNIT/ML injection Inject 0.25 mLs (25 Units total) into the skin at bedtime. Patient taking differently: Inject 10 Units into the skin at bedtime. 07/06/19   Geradine Girt, DO  Insulin Glulisine (APIDRA SOLOSTAR) 100 UNIT/ML Solostar Pen Inject 10-15 Units into the skin 3 (three) times daily before meals.     [provider]  ipratropium-albuterol (DUONEB) 0.5-2.5 (3) MG/3ML SOLN Take 3 mLs by nebulization 3 (three) times daily. 12/20/21   Margaretha Seeds, MD  levothyroxine (SYNTHROID, LEVOTHROID) 25 MCG tablet take 1 tablet by mouth daily Patient taking differently: Take 25 mcg by mouth. Daily except none on Saturdays and Sundays 07/13/17   Carollee Herter, Kendrick Fries R, DO  LORazepam (ATIVAN) 0.5 MG tablet TAKE 1 TABLET BY MOUTH DAILY AS NEEDED FOR ANXIETY Patient taking differently: Take 0.5 mg by mouth every other day. 07/03/22   Ann Held, DO  Multiple Vitamins-Minerals (CENTRUM SILVER ULTRA WOMENS PO) Take 1 tablet by mouth at bedtime.     [provider]  nystatin cream (MYCOSTATIN) Apply 1 application topically 2 (two) times daily. Patient taking differently: Apply 1 application  topically daily as needed (for yeast infection). 12/13/21   Roma Schanz R, DO  ondansetron (ZOFRAN) 4 MG tablet Take 1 tablet (4 mg total) by mouth every 8 (eight) hours as  needed for nausea or vomiting. 10/20/22   Marrian Salvage, FNP  oxybutynin (DITROPAN) 5 MG tablet TAKE 1 TABLET BY MOUTH DAILY 10/16/22   Carollee Herter, Alferd Apa, DO  polyethylene glycol (MIRALAX / GLYCOLAX) 17 g packet Take 17 g by mouth 3 (three) times a week.    [provider]  potassium chloride SA (KLOR-CON M) 20 MEQ tablet TAKE 1 TABLET BY MOUTH DAILY AT BEDTIME Patient taking differently: Take 20 mEq by mouth at bedtime. 03/21/22   Hilty, Nadean Corwin, MD  sucralfate (CARAFATE) 1 g tablet Take 1 tablet (1 g total) by mouth 4 (four) times daily -  with meals and at bedtime. Patient taking differently: Take 1 g by mouth daily as needed. 12/02/19   Ann Held, DO  torsemide (DEMADEX) 20 MG tablet Take 2 tablets (64m) by mouth in the morning and 1 tablet (246m in the afternoon. Patient taking differently: Take 40 mg by mouth daily. .Marland Kitchen  10/31/21   Hilty, Nadean Corwin, MD  traMADol (ULTRAM) 50 MG tablet TAKE 1 TABLET BY MOUTH EVERY 6 HOURS AS NEEDED FOR MODERATE PAIN Patient taking differently: Take 50 mg by mouth daily as needed for moderate pain. 09/20/22   Roma Schanz R, DO  valsartan (DIOVAN) 160 MG tablet TAKE 1 TABLET BY MOUTH DAILY 09/05/22   Skeet Latch, MD    Allergies:   Allergies  Allergen Reactions   Other Other (See Comments)    Patient has hemorrhaged at least 4 times in her lifetime   Cefuroxime Axetil Nausea Only        Lisinopril Cough   Bactrim [Sulfamethoxazole-Trimethoprim] Other (See Comments)    Made her very sick, that's all she remembers   Ciprofloxacin Nausea Only    Social History:  reports that she quit smoking about 48 years ago. Her smoking use included cigarettes. She smoked an average of .25 packs per day. She has never used smokeless tobacco. She reports that she does not drink alcohol and does not use drugs.  Family History: Family History  Problem Relation Age of Onset   Coronary artery disease Mother    Kidney  cancer Father    Cancer Father        renal   COPD Father    Congestive Heart Failure Father    Cancer Sister        bladder   Coronary artery disease Other    Diabetes Other    Hyperlipidemia Other    Hypertension Other    Rheumatologic disease Neg Hx     Physical Exam: Vitals:   10/20/2022 1454 10/26/2022 1457 10/25/2022 1809 10/09/2022 2346  BP: (!) 160/58  (!) 173/53 (!) 121/45  Pulse: 61  67 74  Resp: _0 Temp: (!) 97.5 F (36.4 C)  (!) 97.5 F (36.4 C) 97.8 F (36.6 C)  TempSrc:   Oral Oral  SpO2: 98%  100% 98%  Weight:  83 kg    Height:  _1  (1.6 m)      General:  Alert and oriented times three, some mild distress  eyes: PERRLA, pink conjunctiva, no scleral icterus ENT: Dry lips and oral mucosa, neck supple, no thyromegaly Lungs: clear to ascultation, no wheeze, no crackles, no use of accessory muscles Cardiovascular: regular rate and rhythm, no regurgitation, no gallops, no murmurs. No carotid bruits, no JVD Abdomen: soft, positive BS, non-tender, non-distended, no organomegaly, not an acute abdomen GU: not examined Neuro: CN II - XII grossly intact, sensation intact Musculoskeletal: Decreased motion secondary to pain Skin: Bruising on right arm and left leg Psych: appropriate patient   Labs on Admission:  Recent Labs    10/23/2022 1520  NA 136  K 3.6  CL 94*  CO2 25  GLUCOSE 294*  BUN 57*  CREATININE 1.94*  CALCIUM 9.4   Recent Labs    10/28/2022 1520  AST 36  ALT 11  ALKPHOS 92  BILITOT 1.2  PROT 6.8  ALBUMIN 3.5    Recent Labs    10/20/2022 1520  WBC 5.8  HGB 13.3  HCT 40.8  MCV 89.9  PLT 107*    Micro Results: Recent Results (from the past 240 hour(s))  Urine Culture     Status: Abnormal   Collection Time: 10/20/22  3:52 PM   Specimen: Urine  Result Value Ref Range Status   MICRO NUMBER: [id number]  Final   SPECIMEN QUALITY: Adequate  Final   Sample Source  URINE  Final   STATUS: FINAL  Final   ISOLATE 1: Klebsiella pneumoniae  (A)  Final    Comment: Greater than 100,000 CFU/mL of Klebsiella pneumoniae      Susceptibility   Klebsiella pneumoniae - URINE CULTURE, REFLEX    AMOX/CLAVULANIC <=2 Sensitive     AMPICILLIN >=32 Resistant     AMPICILLIN/SULBACTAM 8 Sensitive     CEFAZOLIN* <=4 Not Reportable      * For infections other than uncomplicated UTI caused by E. coli, K. pneumoniae or P. mirabilis: Cefazolin is resistant if MIC > or = 8 mcg/mL. (Distinguishing susceptible versus intermediate for isolates with MIC < or = 4 mcg/mL requires additional testing.) For uncomplicated UTI caused by E. coli, K. pneumoniae or P. mirabilis: Cefazolin is susceptible if MIC <32 mcg/mL and predicts susceptible to the oral agents cefaclor, cefdinir, cefpodoxime, cefprozil, cefuroxime, cephalexin and loracarbef.     CEFTAZIDIME <=1 Sensitive     CEFEPIME <=1 Sensitive     CEFTRIAXONE <=1 Sensitive     CIPROFLOXACIN <=0.25 Sensitive     LEVOFLOXACIN 1 Intermediate     GENTAMICIN <=1 Sensitive     IMIPENEM <=0.25 Sensitive     NITROFURANTOIN 128 Resistant     PIP/TAZO 8 Sensitive     TOBRAMYCIN <=1 Sensitive     TRIMETH/SULFA* <=20 Sensitive      * For infections other than uncomplicated UTI caused by E. coli, K. pneumoniae or P. mirabilis: Cefazolin is resistant if MIC > or = 8 mcg/mL. (Distinguishing susceptible versus intermediate for isolates with MIC < or = 4 mcg/mL requires additional testing.) For uncomplicated UTI caused by E. coli, K. pneumoniae or P. mirabilis: Cefazolin is susceptible if MIC <32 mcg/mL and predicts susceptible to the oral agents cefaclor, cefdinir, cefpodoxime, cefprozil, cefuroxime, cephalexin and loracarbef. Legend: S = Susceptible  I = Intermediate R = Resistant  NS = Not susceptible * = Not tested  NR = Not reported **NN = See antimicrobic comments      Radiological Exams on Admission: MR LUMBAR SPINE WO CONTRAST  Result Date: 10/25/2022 CLINICAL DATA:  Low back pain,  fracture on same-day CT EXAM: MRI LUMBAR SPINE WITHOUT CONTRAST TECHNIQUE: Multiplanar, multisequence MR imaging of the lumbar spine was performed. No intravenous contrast was administered. COMPARISON:  10/04/2022 CT lumbar spine, correlation is also made with 07/02/2019 CT abdomen pelvis FINDINGS: Segmentation:  5 lumbar type vertebral bodies. Alignment:  Mild levocurvature.  Trace anterolisthesis of L4 on L5. Vertebrae: Diffusely decreased T1 and diffusely increased T2 hyperintense signal throughout the L4 vertebral body but most prominent towards the posterior aspect, extending into the bilateral pedicles. Approximately 30% vertebral body height loss, with bowing of the posterior cortex, which extends approximately 7 mm into the spinal canal (series 6, image 8), concerning for pathologic fracture. Otherwise normal marrow signal and preserved vertebral body heights. Conus medullaris and cauda equina: Conus extends to the L1 level. Conus and cauda equina appear normal. Paraspinal and other soft tissues: Status post left nephrectomy. Right cortical atrophy and scarring. Atrophy of the paraspinous musculature. Disc levels: T12-L1: No significant disc bulge. No spinal canal stenosis or neural foraminal narrowing. L1-L2: Minimal disc bulge with left foraminal protrusion. Mild facet arthropathy. No spinal canal stenosis or neural foraminal narrowing. L2-L3: Minimal disc bulge with left foraminal protrusion. Mild facet arthropathy. No spinal canal stenosis or neural foraminal narrowing. L3-L4: No significant disc bulge. Bowing of the posterior cortex of L4 causes moderate spinal canal stenosis just below the  disc level. Narrowing of the lateral recesses. No significant neural foraminal narrowing. L4-L5: Minimal disc bulge. Moderate to severe facet arthropathy. No spinal canal stenosis. Mild left neural foraminal narrowing. L5-S1: Minimal disc bulge. Mild right and moderate left facet arthropathy. Narrowing of the left  lateral recess. No spinal canal stenosis. Mild right neural foraminal narrowing. IMPRESSION: 1. Findings concerning for pathologic fracture of the L4 vertebral body, with approximately 30% vertebral body height loss and bowing of the posterior cortex, which causes moderate spinal canal stenosis posterior to the L4 vertebral body, as well as mild left neural foraminal narrowing at L4-L5. Given the patient's history of renal cell carcinoma, this is concerning for metastasis. 2. L5-S1 mild right neural foraminal narrowing. Narrowing of the left lateral recess at this level could affect the descending left S1 nerve roots. These results were called by telephone at the time of interpretation on 10/18/2022 at 11:41 pm to provider [facility], who verbally acknowledged these results. Electronically Signed   By: Merilyn Baba M.D.   On: 10/29/2022 23:41   CT Hip Left Wo Contrast  Result Date: 10/20/2022 CLINICAL DATA:  Hip trauma, fracture suspected. EXAM: CT OF THE LEFT HIP WITHOUT CONTRAST TECHNIQUE: Multidetector CT imaging of the left hip was performed according to the standard protocol. Multiplanar CT image reconstructions were also generated. RADIATION DOSE REDUCTION: This exam was performed according to the departmental dose-optimization program which includes automated exposure control, adjustment of the mA and/or kV according to patient size and/or use of iterative reconstruction technique. COMPARISON:  None Available. FINDINGS: Bones/Joint/Cartilage Status post left hip cephalomedullary nail placement. The hardware is intact. No perihardware fracture. There is a small fracture about the posterior acetabular wall of indeterminate age. Ligaments Suboptimally assessed by CT. Muscles and Tendons No intramuscular hematoma or fluid collection. Soft tissues Skin and subcutaneous soft tissues are within normal limits. Prominent vascular calcifications. IMPRESSION: 1. Status post left hip cephalomedullary nail  placement. The hardware is intact. No perihardware fracture. 2. Small fracture about the posterior acetabular wall of indeterminate age. 3. No intramuscular hematoma or fluid collection. Electronically Signed   By: Keane Police D.O.   On: 10/16/2022 21:05   CT Lumbar Spine Wo Contrast  Result Date: 10/13/2022 CLINICAL DATA:  Low back pain, trauma. EXAM: CT LUMBAR SPINE WITHOUT CONTRAST TECHNIQUE: Multidetector CT imaging of the lumbar spine was performed without intravenous contrast administration. Multiplanar CT image reconstructions were also generated. RADIATION DOSE REDUCTION: This exam was performed according to the departmental dose-optimization program which includes automated exposure control, adjustment of the mA and/or kV according to patient size and/or use of iterative reconstruction technique. COMPARISON:  Radiographs dated October 13, 2022 FINDINGS: Segmentation: 5 lumbar type vertebrae. Alignment: Grade 1 anterolisthesis of L4, chronic process. Vertebrae: Compression fracture of the L4 vertebral body. There is a proximally 20% vertebral body height loss. No significant retropulsion of the vertebral body into the spinal canal. Paraspinal and other soft tissues: Soft tissue swelling and hematoma about the vertebral body. Postsurgical changes for prior left nephrectomy. Advanced atherosclerotic disease of abdominal aorta and bilateral iliac vessels. Disc levels: Advanced multilevel degenerate disc disease with associated facet joint arthropathy. T12-L1: No significant spinal canal or neural foraminal stenosis. L1-L2: Disc osteophyte complex with mild bilateral lateral recess stenosis. Mild facet joint arthropathy. No significant neural foraminal stenosis. L2-L3: Disc osteophyte complex with bilateral lateral recess stenosis. Facet joint arthropathy. No significant neural foraminal stenosis. L3-L4: Mild retropulsion of the superior endplate of the L4 vertebral body into  the spinal canal. Moderate  bilateral lateral recess stenosis. Mild bilateral neural foraminal stenosis. L4-L5: Disc osteophyte complex with bilateral lateral recess stenosis. Mild to moderate bilateral neural foraminal stenosis, left worse than the right. Moderate bilateral facet joint arthropathy. L5-S1: Disc osteophyte complex with moderate lateral recess stenosis. Moderate-to-severe bilateral facet joint arthropathy. IMPRESSION: 1. Acute compression fracture of the L4 vertebral body with approximately 20% vertebral body height loss. Mild retropulsion of the superior endplate into the spinal canal. Further evaluation with MR examination is suggested. 2. Advanced multilevel degenerate disc disease and facet joint arthropathy as described above. 3. Aortic atherosclerosis. Aortic Atherosclerosis (ICD10-I70.0). Electronically Signed   By: Keane Police D.O.   On: 10/19/2022 20:56   CT PELVIS WO CONTRAST  Result Date: 10/09/2022 CLINICAL DATA:  Hip trauma, fracture suspected.  Low back pain. EXAM: CT PELVIS WITHOUT CONTRAST TECHNIQUE: Multidetector CT imaging of the pelvis was performed following the standard protocol without intravenous contrast. RADIATION DOSE REDUCTION: This exam was performed according to the departmental dose-optimization program which includes automated exposure control, adjustment of the mA and/or kV according to patient size and/or use of iterative reconstruction technique. COMPARISON:  10/06/2022. FINDINGS: Urinary Tract:  No abnormality visualized. Bowel:  No acute abnormality. Vascular/Lymphatic: Atherosclerotic calcification of the distal abdominal aorta and its tributaries. No abdominal or pelvic lymphadenopathy. Reproductive:  Hysterectomy changes are noted. Other: A small amount of free fluid is present in the pelvis. Small fat containing umbilical hernia. Musculoskeletal: No acute fracture or dislocation at the left hip. Fixation hardware is present in the proximal left femur without evidence of hardware  loosening. A compression fracture is noted in the superior endplate of L4, partially visualized on exam. Degenerative changes are noted in the lower lumbar spine. IMPRESSION: 1. No acute fracture or dislocation at the left hip. 2. Compression deformity in the superior endplate of L4, partially visualized on exam. Please refer to CT lumbar spine for additional information. 3. Aortic atherosclerosis. Electronically Signed   By: Brett Fairy M.D.   On: 10/08/2022 20:44   DG Hip Unilat W or Wo Pelvis 2-3 Views Left  Result Date: 10/12/2022 CLINICAL DATA:  Left hip pain. Weakness. Fall 1 month ago. EXAM: DG HIP (WITH OR WITHOUT PELVIS) 2-3V LEFT COMPARISON:  None Available. FINDINGS: Intramedullary nail with trans trochanteric and distal locking screw fixation of remote proximal femur fracture. There is no periprosthetic fracture or lucency. Femoral head is well seated, no dislocation. Intact pubic rami. Pubic symphysis and sacroiliac joints are congruent. The bones are under mineralized. There are vascular calcifications IMPRESSION: 1. No acute fracture or dislocation of the left hip. 2. Hardware in place without complication. Electronically Signed   By: Keith Rake M.D.   On: 10/13/2022 16:14    Assessment/Plan Present on Admission:  Intractable pain/compression fracture L4, left acetabular fracture  Possible pathological fracture -Admit to MedSurg -N.p.o., gentle IV fluid hydration -Fentanyl patch 25 mcg, Lidoderm patch ordered -Tylenol scheduled, 1000 mg every 6 -NSAIDs ordered as patient likely going for biopsy -Dilaudid 1 mg IV as needed severe pain -May benefit from MRI LS spine w contrast but patient with CKD stage IV.  Ortho and IR consult in a.m. INR in a.m. -PT consult per ortho direction  Nausea/clinical dehydration -Zofran and gentle IV fluid hydration  CKD stage V/history of nephrectomy -Gentle IV fluid hydration. -Patient's nephrologist recently [place] patient's valsartan and  furosemide.  Creatinine since improved   Chronic diastolic CHF (congestive heart failure) (HCC) -Stable, Coreg, and hydralazine  resumed. -Valsartan and torsemide have not been resumed  Diabetes mellitus type 2, uncontrolled -Currently n.p.o. -Lantus resumed 10 units subcu to cure sleep, sliding scale insulin   Hyperlipidemia -Stable, Vytorin resumed   Essential hypertension -Stable, Coreg and hydralazine resumed   Liver cirrhosis secondary to NASH (HCC)  Thrombocytopenia (HCC) chronic -Stable, monitor   Anxiety and depression -Continue patient's Ativan as needed and low-dose Synthroid   Chronic hypoxemic respiratory failure (HCC)  Pulmonary hypertension (HCC)   OSA (obstructive sleep apnea) -  Javel Hersh 10/25/2022, 1:12 AM

## 2022-10-25 NOTE — Progress Notes (Addendum)
Patient seen and examined today, admitted this morning by Dr. Claria Dice.    Briefly 87 year old female with history of chronic diastolic CHF, CKD stage III, Nash, liver cirrhosis, pulmonary hypertension, renal cell carcinoma with prior nephrectomy, HTN, HLP, hypothyroidism, diabetes mellitus presented with intractable back pain.  Per patient, approximately 3 years ago she fell and broke her hip and had ORIF done, since then she has mild pain off and on.  However 3 months ago while in the bathroom she had a mechanical fall and since then has been constantly hurting.  Her PCP had ordered tramadol which is not working now.  In ED, MRI lumbar spine showed L4 fracture concerning for pathological fracture with approximately 30% vertebral body height loss and bowing of the posterior cortex causes moderate spinal canal stenosis posterior to the L4 vertebral body as well as mild left neural foraminal narrowing at L4-5.  Concerning for metastasis given history of renal cell carcinoma.  BP (!) 131/51 (BP Location: Right Arm)   Pulse 76   Temp 97.9 F (36.6 C) (Oral)   Resp (!) 21   Ht _0  (1.6 m)   Wt 83 kg   LMP  (LMP Unknown)   SpO2 97%   BMI 32.41 kg/m   Labs reviewed.  Creatinine 1.9 on admission, improved to 1.7 today.  Creatinine was 2.4 on 10/13/2022  Physical Exam General: Alert and oriented x 3, NAD Cardiovascular: S1 S2 clear, RRR.  Respiratory: CTAB, no wheezing, rales or rhonchi Gastrointestinal: Soft, nontender, nondistended, NBS Ext: no pedal edema bilaterally, ROM LE decreased due to pain in the back  Neuro: no new deficits Psych: Normal affect and demeanor, alert and oriented x3    A/p Intractable back pain with compression fracture of L4 with concern for pathological fracture and metastasis -MRI, CT of the pelvis showed compression fracture of L4, ?  Pathological with 30% vertebral height loss -Interventional radiology consulted for possible vertebroplasty -Given concern for  pathological fracture and metastasis, discussed in detail with the patient, she wants to see Dr. Marin Olp her oncologist for further recommendations, consulted Dr. Marin Olp -Continue pain control, PT OT Addendum: 10:10am D/w interventional radiology, approved for vertebroplasty, recommended to hold aspirin, will start insurance authorization.   History of renal cell CA with nephrectomy -Discussed with Dr. Marin Olp, recommended CT chest abdomen pelvis, will likely need biopsy to assess if renal CA versus another primary cancer with metastasis  Posterior left acetabular wall fracture of indeterminate age -CT of the left hip showed small fracture of posterior acetabular wall of indeterminate age - orthopedics consulted, d/w Dr Stann Mainland, will evaluate, recommended bedrest for now until evaluation  Klebsiella UTI -Patient was recently diagnosed with UTI, urine culture showing Klebsiella.   - she has only taken 2 days of oral antibiotics, Augmentin -UA + UTI, follow urine cultures, placed on IV Rocephin  CKD stage IV, history of renal cell carcinoma with nephrectomy -Creatinine 1.94 on admission, improving to 1.7.  Creatinine 2.4 on 10/13/2022 - patient was placed on IV fluid hydration, patient's nephrologist had recently DC'd valsartan and furosemide.  Creatinine since then improving.  DM type II, IDDM, on long-term insulin with CKD stage IV -Resumed carb modified diet, sensitive sliding scale insulin, Lantus 10 units daily HbA1c 6.4    Aniston Christman M.D.  [facility] 10/25/2022, 8:15 AM

## 2022-10-25 NOTE — Progress Notes (Signed)
Interventional Radiology Brief Note:  IR consulted for L4 kyphoplasty/vertebroplasty.  Imaging reviewed by Dr. Karenann Cai who approves patient for procedure with Osteocool ablation due to pathological fracture.  Insurance approval has been sent.  Formal consult to follow pending approval to proceed.   Brynda Greathouse, MS RD PA-C

## 2022-10-25 NOTE — ED Notes (Signed)
ED TO INPATIENT HANDOFF REPORT  ED Nurse Name and Phone #: Beckham Capistran RN [address] Name/Age/Gender Margaret Dyer 87 y.o. female Room/Bed: 014C/014C  Code Status   Code Status: Full Code  Home/SNF/Other Rehab Patient oriented to: self, place, time, and situation Is this baseline? Yes   Triage Complete: Triage complete  Chief Complaint Intractable pain [R52]  Triage Note Pt arrives via EMS from kidney doc with weakness and continued hip pain. Pt had a fall a month ago into the bathtub. Per family, pt is not altered, just lethargic and hip pain.    Allergies Allergies  Allergen Reactions   Other Other (See Comments)    Patient has hemorrhaged at least 4 times in her lifetime   Cefuroxime Axetil Nausea Only        Lisinopril Cough   Bactrim [Sulfamethoxazole-Trimethoprim] Other (See Comments)    Made her very sick, that's all she remembers   Ciprofloxacin Nausea Only    Level of Care/Admitting Diagnosis ED Disposition     ED Disposition  Admit   Condition  --   [place]: [place] [[id number]]  Level of Care: Med-Surg [16]  May admit patient to [facility] or [facility] if equivalent level of care is available:: Yes  Covid Evaluation: Asymptomatic - no recent exposure (last 10 days) testing not required  Diagnosis: Intractable pain [811914]  Admitting Physician: [place], [place]  Attending Physician: Quintella Baton [[id number]]  Certification:: I certify this patient will need inpatient services for at least 2 midnights  Estimated Length of Stay: 2          B Medical/Surgery History Past Medical History:  Diagnosis Date   Asthma    Blood transfusion    Chronic diastolic CHF (congestive heart failure) ([place])    CKD (chronic kidney disease), stage III ([place])    Coronary artery calcification seen on CT scan    Diabetes mellitus    Erythropoietin deficiency anemia 09/19/2021   Goiter    Hyperlipidemia    Hypertension     Hypothyroidism    Iron deficiency anemia due to chronic blood loss 09/19/2021   Liver cirrhosis ([place])    NASH (nonalcoholic steatohepatitis)    Obesity    Osteopenia    Pulmonary hypertension ([place])    Renal cell carcinoma    a. prior nephrectomy.   Right heart failure (HCC)    Thrombocytopenia ([place])    Past Surgical History:  Procedure Laterality Date   ABDOMINAL HYSTERECTOMY     APPENDECTOMY     HEMORRHOID SURGERY     INTRAMEDULLARY (IM) NAIL INTERTROCHANTERIC Left 12/25/2018   Procedure: INTRAMEDULLARY (IM) NAIL INTERTROCHANTRIC;  Surgeon: Nicholes Stairs, MD;  Location: [facility] [facility];  Service: Orthopedics;  Laterality: Left;   KNEE SURGERY     NEPHRECTOMY  09.2000     A IV Location/Drains/Wounds Patient Lines/Drains/Airways Status     Active Line/Drains/Airways     Name Placement date Placement time Site Days   Peripheral IV 10/15/2022 22 G Anterior;Left;Proximal Forearm 10/22/2022  1853  Forearm  1   Incision (Closed) 05/18/17 Arm Right 05/18/17  1706  -- 1986   Incision (Closed) 12/25/18 Hip Left 12/25/18  1251  -- 1400   Wound / Incision (Open or Dehisced) 03/02/14 Other (Comment) Arm Left;Posterior 03/02/14  1629  Arm  3159            Intake/Output Last 24 hours  Intake/Output Summary (Last 24 hours) at 10/25/2022 1606  Last data filed at 11/01/2022 2230 Gross per 24 hour  Intake 1000 ml  Output --  Net 1000 ml    Labs/Imaging Results for orders placed or performed during the hospital encounter of 10/14/2022 (from the past 48 hour(s))  CBC     Status: Abnormal   Collection Time: 10/06/2022  3:20 PM  Result Value Ref Range   WBC 5.8 4.0 - 10.5 K/uL   RBC 4.54 3.87 - 5.11 MIL/uL   Hemoglobin 13.3 12.0 - 15.0 g/dL   HCT 40.8 36.0 - 46.0 %   MCV 89.9 80.0 - 100.0 fL   MCH 29.3 26.0 - 34.0 pg   MCHC 32.6 30.0 - 36.0 g/dL   RDW 14.6 11.5 - 15.5 %   Platelets 107 (L) 150 - 400 K/uL   nRBC 0.0 0.0 - 0.2 %    Comment: Performed at [facility], [place]  [address], [place], [place] [address]  Comprehensive metabolic panel     Status: Abnormal   Collection Time: 10/28/2022  3:20 PM  Result Value Ref Range   Sodium 136 135 - 145 mmol/L   Potassium 3.6 3.5 - 5.1 mmol/L   Chloride 94 (L) 98 - 111 mmol/L   CO2 25 22 - 32 mmol/L   Glucose, Bld 294 (H) 70 - 99 mg/dL    Comment: Glucose reference range applies only to samples taken after fasting for at least 8 hours.   BUN 57 (H) 8 - 23 mg/dL   Creatinine, Ser 1.94 (H) 0.44 - 1.00 mg/dL   Calcium 9.4 8.9 - 10.3 mg/dL   Total Protein 6.8 6.5 - 8.1 g/dL   Albumin 3.5 3.5 - 5.0 g/dL   AST 36 15 - 41 U/L   ALT 11 0 - 44 U/L   Alkaline Phosphatase 92 38 - 126 U/L   Total Bilirubin 1.2 0.3 - 1.2 mg/dL   GFR, Estimated 25 (L) >60 mL/min    Comment: (NOTE) Calculated using the CKD-EPI Creatinine Equation (2021)    Anion gap 17 (H) 5 - 15    Comment: Performed at [facility], [place] [address], [place], [place] [address]  CBG monitoring, ED     Status: Abnormal   Collection Time: 10/25/22 12:20 AM  Result Value Ref Range   Glucose-Capillary 196 (H) 70 - 99 mg/dL    Comment: Glucose reference range applies only to samples taken after fasting for at least 8 hours.  CBC     Status: Abnormal   Collection Time: 10/25/22  2:35 AM  Result Value Ref Range   WBC 6.9 4.0 - 10.5 K/uL   RBC 4.39 3.87 - 5.11 MIL/uL   Hemoglobin 12.7 12.0 - 15.0 g/dL   HCT 40.6 36.0 - 46.0 %   MCV 92.5 80.0 - 100.0 fL   MCH 28.9 26.0 - 34.0 pg   MCHC 31.3 30.0 - 36.0 g/dL   RDW 15.1 11.5 - 15.5 %   Platelets 93 (L) 150 - 400 K/uL    Comment: REPEATED TO VERIFY   nRBC 0.0 0.0 - 0.2 %    Comment: Performed at [facility], [place] [address], [place], [place] [address]  Creatinine, serum     Status: Abnormal   Collection Time: 10/25/22  2:35 AM  Result Value Ref Range   Creatinine, Ser 1.82 (H) 0.44 - 1.00 mg/dL   GFR, Estimated 27 (L) >60 mL/min    Comment: (NOTE) Calculated using the CKD-EPI Creatinine Equation  (  2021) Performed at [facility], [place] [address], [place], [place] [address]   Urinalysis, Routine w reflex microscopic     Status: Abnormal   Collection Time: 10/25/22  2:45 AM  Result Value Ref Range   Color, Urine YELLOW YELLOW   APPearance CLEAR CLEAR   Specific Gravity, Urine 1.013 1.005 - 1.030   pH 5.0 5.0 - 8.0   Glucose, UA 50 (A) NEGATIVE mg/dL   Hgb urine dipstick NEGATIVE NEGATIVE   Bilirubin Urine NEGATIVE NEGATIVE   Ketones, ur NEGATIVE NEGATIVE mg/dL   Protein, ur NEGATIVE NEGATIVE mg/dL   Nitrite NEGATIVE NEGATIVE   Leukocytes,Ua SMALL (A) NEGATIVE   RBC / HPF 0-5 0 - 5 RBC/hpf   WBC, UA 6-10 0 - 5 WBC/hpf   Bacteria, UA RARE (A) NONE SEEN   Squamous Epithelial / LPF 0-5 0 - 5   Mucus PRESENT    Hyaline Casts, UA PRESENT     Comment: Performed at [facility], [address] [place], [place] [address]  Magnesium     Status: None   Collection Time: 10/25/22  6:20 AM  Result Value Ref Range   Magnesium 2.2 1.7 - 2.4 mg/dL    Comment: Performed at [facility] [address], [place], [place] [address]  Protime-INR     Status: Abnormal   Collection Time: 10/25/22  6:20 AM  Result Value Ref Range   Prothrombin Time 16.5 (H) 11.4 - 15.2 seconds   INR 1.3 (H) 0.8 - 1.2    Comment: (NOTE) INR goal varies based on device and disease states. Performed at [facility], [place] [address], [place], [place] [address]   Basic metabolic panel     Status: Abnormal   Collection Time: 10/25/22  6:20 AM  Result Value Ref Range   Sodium 138 135 - 145 mmol/L   Potassium 4.0 3.5 - 5.1 mmol/L   Chloride 101 98 - 111 mmol/L   CO2 24 22 - 32 mmol/L   Glucose, Bld 200 (H) 70 - 99 mg/dL    Comment: Glucose reference range applies only to samples taken after fasting for at least 8 hours.   BUN 54 (H) 8 - 23 mg/dL   Creatinine, Ser 1.73 (H) 0.44 - 1.00 mg/dL   Calcium 8.4 (L) 8.9 - 10.3 mg/dL   GFR, Estimated 29 (L) >60 mL/min    Comment:  (NOTE) Calculated using the CKD-EPI Creatinine Equation (2021)    Anion gap 13 5 - 15    Comment: Performed at [facility] [address], [place], [place] [address]  Hemoglobin A1c     Status: Abnormal   Collection Time: 10/25/22  6:20 AM  Result Value Ref Range   Hgb A1c MFr Bld 6.4 (H) 4.8 - 5.6 %    Comment: (NOTE) Pre diabetes:          5.7%-6.4%  Diabetes:              >6.4%  Glycemic control for   <7.0% adults with diabetes    Mean Plasma Glucose 136.98 mg/dL    Comment: Performed at [facility] [address], ,  [address]  CBG monitoring, ED     Status: Abnormal   Collection Time: 10/25/22  7:49 AM  Result Value Ref Range   Glucose-Capillary 186 (H) 70 - 99 mg/dL    Comment: Glucose reference range applies only to samples taken after fasting for  at least 8 hours.   Comment 1 Notify RN    Comment 2 Document in Chart   CBG monitoring, ED     Status: None   Collection Time: 10/25/22 12:17 PM  Result Value Ref Range   Glucose-Capillary 99 70 - 99 mg/dL    Comment: Glucose reference range applies only to samples taken after fasting for at least 8 hours.   *Note: Due to a large number of results and/or encounters for the requested time period, some results have not been displayed. A complete set of results can be found in Results Review.   CT CHEST WO CONTRAST  Result Date: 10/25/2022 CLINICAL DATA:  Renal mass/cyst; indeterminate pathologic fracture with possible metastasis; assess for malignancy in the abdomen history of renal cancer EXAM: CT CHEST AND ABDOMEN WITHOUT CONTRAST TECHNIQUE: Multidetector CT imaging of the chest and abdomen was performed following the standard protocol without intravenous contrast. RADIATION DOSE REDUCTION: This exam was performed according to the departmental dose-optimization program which includes automated exposure control, adjustment of the mA and/or kV according to patient size and/or use of iterative  reconstruction technique. COMPARISON:  CT 07/02/2019 and MRI lumbar spine 10/21/2022; CT pelvis and L-spine 10/19/2022; CT chest 01/31/2022 FINDINGS: CT CHEST FINDINGS WITHOUT CONTRAST Cardiovascular: Aortic and coronary artery atherosclerotic calcification. Trace pericardial effusion. Borderline cardiomegaly. Mediastinum/Nodes: There are a few prominent subcentimeter mediastinal and hilar lymph nodes. For example 9 mm pretracheal node (2/19). These are unchanged from 01/31/2022. Unremarkable esophagus. Normal thyroid. Patent trachea. Lungs/Pleura: Innumerable bilateral pulmonary nodules measuring up to 4.1 cm in the left upper lobe compatible with metastases. These are new from 01/23/2022. Trace left pleural effusion. Bibasilar atelectasis/scarring. No pneumothorax. Musculoskeletal: No chest wall mass or suspicious bone lesions identified. CT ABDOMEN FINDINGS WITHOUT CONTRAST Hepatobiliary: Shrunken nodular contour of the liver compatible with cirrhosis. Poorly defined area of hypoattenuation in the posterior right hepatic lobe measuring approximately 7.7 cm. Additional 1.3 cm focus of hypoattenuation in the left hepatic lobe. These are incompletely evaluated without IV contrast however are concerning for metastases. Distended gallbladder. Punctate stone in the gallbladder neck measuring 2 mm. No biliary dilation. Pancreas: Unremarkable. Spleen: Unremarkable. Adrenals/Urinary Tract: Unchanged low-density nodules in both adrenal glands and should be benign. Cortical scarring right kidney. No definite right renal mass on noncontrast exam. Prominent right extrarenal pelvis. No hydronephrosis. No urinary calculi visualized. Left nephrectomy. No definite soft tissue mass in the nephrectomy bed to suggest recurrence. Stomach/Bowel: Unremarkable stomach. The visualized small bowel and colon is normal in caliber. Vascular/Lymphatic: Advanced aortic and iliac atherosclerotic calcification. Other: Small amount of free fluid  in the left pericolic gutter. Indeterminate mesenteric stranding in the left hemiabdomen. Musculoskeletal: Redemonstrated presumed pathologic compression fracture of L4 better evaluated on MRI 10/21/2022. No additional acute osseous abnormality or aggressive osseous lesions. IMPRESSION: 1. Innumerable bilateral pulmonary metastases. 2. Hepatic cirrhosis with areas of hypoattenuation in both hepatic lobes concerning for metastases. These are incompletely evaluated without IV contrast. Consider CT or MRI with IV contrast for further evaluation. 3. Left nephrectomy. 4. Small amount of free fluid in the left pericolic gutter and mesenteric stranding in the left hemiabdomen. This could be related to portal hypertension however continued attention on follow-up is recommended. 5. Cholelithiasis. 6. Redemonstrated presumed pathologic fracture of L4, better evaluated on prior MRI. 7.  Aortic Atherosclerosis (ICD10-I70.0). Electronically Signed   By: Placido Sou M.D.   On: 10/25/2022 11:43   CT ABDOMEN WO CONTRAST  Result Date: 10/25/2022 CLINICAL DATA:  Renal mass/cyst;  indeterminate pathologic fracture with possible metastasis; assess for malignancy in the abdomen history of renal cancer EXAM: CT CHEST AND ABDOMEN WITHOUT CONTRAST TECHNIQUE: Multidetector CT imaging of the chest and abdomen was performed following the standard protocol without intravenous contrast. RADIATION DOSE REDUCTION: This exam was performed according to the departmental dose-optimization program which includes automated exposure control, adjustment of the mA and/or kV according to patient size and/or use of iterative reconstruction technique. COMPARISON:  CT 07/02/2019 and MRI lumbar spine 10/20/2022; CT pelvis and L-spine 10/10/2022; CT chest 01/31/2022 FINDINGS: CT CHEST FINDINGS WITHOUT CONTRAST Cardiovascular: Aortic and coronary artery atherosclerotic calcification. Trace pericardial effusion. Borderline cardiomegaly. Mediastinum/Nodes:  There are a few prominent subcentimeter mediastinal and hilar lymph nodes. For example 9 mm pretracheal node (2/19). These are unchanged from 01/31/2022. Unremarkable esophagus. Normal thyroid. Patent trachea. Lungs/Pleura: Innumerable bilateral pulmonary nodules measuring up to 4.1 cm in the left upper lobe compatible with metastases. These are new from 01/23/2022. Trace left pleural effusion. Bibasilar atelectasis/scarring. No pneumothorax. Musculoskeletal: No chest wall mass or suspicious bone lesions identified. CT ABDOMEN FINDINGS WITHOUT CONTRAST Hepatobiliary: Shrunken nodular contour of the liver compatible with cirrhosis. Poorly defined area of hypoattenuation in the posterior right hepatic lobe measuring approximately 7.7 cm. Additional 1.3 cm focus of hypoattenuation in the left hepatic lobe. These are incompletely evaluated without IV contrast however are concerning for metastases. Distended gallbladder. Punctate stone in the gallbladder neck measuring 2 mm. No biliary dilation. Pancreas: Unremarkable. Spleen: Unremarkable. Adrenals/Urinary Tract: Unchanged low-density nodules in both adrenal glands and should be benign. Cortical scarring right kidney. No definite right renal mass on noncontrast exam. Prominent right extrarenal pelvis. No hydronephrosis. No urinary calculi visualized. Left nephrectomy. No definite soft tissue mass in the nephrectomy bed to suggest recurrence. Stomach/Bowel: Unremarkable stomach. The visualized small bowel and colon is normal in caliber. Vascular/Lymphatic: Advanced aortic and iliac atherosclerotic calcification. Other: Small amount of free fluid in the left pericolic gutter. Indeterminate mesenteric stranding in the left hemiabdomen. Musculoskeletal: Redemonstrated presumed pathologic compression fracture of L4 better evaluated on MRI 10/25/2022. No additional acute osseous abnormality or aggressive osseous lesions. IMPRESSION: 1. Innumerable bilateral pulmonary  metastases. 2. Hepatic cirrhosis with areas of hypoattenuation in both hepatic lobes concerning for metastases. These are incompletely evaluated without IV contrast. Consider CT or MRI with IV contrast for further evaluation. 3. Left nephrectomy. 4. Small amount of free fluid in the left pericolic gutter and mesenteric stranding in the left hemiabdomen. This could be related to portal hypertension however continued attention on follow-up is recommended. 5. Cholelithiasis. 6. Redemonstrated presumed pathologic fracture of L4, better evaluated on prior MRI. 7.  Aortic Atherosclerosis (ICD10-I70.0). Electronically Signed   By: Placido Sou M.D.   On: 10/25/2022 11:43   MR LUMBAR SPINE WO CONTRAST  Result Date: 10/13/2022 CLINICAL DATA:  Low back pain, fracture on same-day CT EXAM: MRI LUMBAR SPINE WITHOUT CONTRAST TECHNIQUE: Multiplanar, multisequence MR imaging of the lumbar spine was performed. No intravenous contrast was administered. COMPARISON:  10/04/2022 CT lumbar spine, correlation is also made with 07/02/2019 CT abdomen pelvis FINDINGS: Segmentation:  5 lumbar type vertebral bodies. Alignment:  Mild levocurvature.  Trace anterolisthesis of L4 on L5. Vertebrae: Diffusely decreased T1 and diffusely increased T2 hyperintense signal throughout the L4 vertebral body but most prominent towards the posterior aspect, extending into the bilateral pedicles. Approximately 30% vertebral body height loss, with bowing of the posterior cortex, which extends approximately 7 mm into the spinal canal (series 6, image 8), concerning for pathologic  fracture. Otherwise normal marrow signal and preserved vertebral body heights. Conus medullaris and cauda equina: Conus extends to the L1 level. Conus and cauda equina appear normal. Paraspinal and other soft tissues: Status post left nephrectomy. Right cortical atrophy and scarring. Atrophy of the paraspinous musculature. Disc levels: T12-L1: No significant disc bulge. No  spinal canal stenosis or neural foraminal narrowing. L1-L2: Minimal disc bulge with left foraminal protrusion. Mild facet arthropathy. No spinal canal stenosis or neural foraminal narrowing. L2-L3: Minimal disc bulge with left foraminal protrusion. Mild facet arthropathy. No spinal canal stenosis or neural foraminal narrowing. L3-L4: No significant disc bulge. Bowing of the posterior cortex of L4 causes moderate spinal canal stenosis just below the disc level. Narrowing of the lateral recesses. No significant neural foraminal narrowing. L4-L5: Minimal disc bulge. Moderate to severe facet arthropathy. No spinal canal stenosis. Mild left neural foraminal narrowing. L5-S1: Minimal disc bulge. Mild right and moderate left facet arthropathy. Narrowing of the left lateral recess. No spinal canal stenosis. Mild right neural foraminal narrowing. IMPRESSION: 1. Findings concerning for pathologic fracture of the L4 vertebral body, with approximately 30% vertebral body height loss and bowing of the posterior cortex, which causes moderate spinal canal stenosis posterior to the L4 vertebral body, as well as mild left neural foraminal narrowing at L4-L5. Given the patient's history of renal cell carcinoma, this is concerning for metastasis. 2. L5-S1 mild right neural foraminal narrowing. Narrowing of the left lateral recess at this level could affect the descending left S1 nerve roots. These results were called by telephone at the time of interpretation on 10/29/2022 at 11:41 pm to provider DAVID [facility], who verbally acknowledged these results. Electronically Signed   By: Merilyn Baba M.D.   On: 10/31/2022 23:41   CT Hip Left Wo Contrast  Result Date: 10/27/2022 CLINICAL DATA:  Hip trauma, fracture suspected. EXAM: CT OF THE LEFT HIP WITHOUT CONTRAST TECHNIQUE: Multidetector CT imaging of the left hip was performed according to the standard protocol. Multiplanar CT image reconstructions were also generated. RADIATION DOSE  REDUCTION: This exam was performed according to the departmental dose-optimization program which includes automated exposure control, adjustment of the mA and/or kV according to patient size and/or use of iterative reconstruction technique. COMPARISON:  None Available. FINDINGS: Bones/Joint/Cartilage Status post left hip cephalomedullary nail placement. The hardware is intact. No perihardware fracture. There is a small fracture about the posterior acetabular wall of indeterminate age. Ligaments Suboptimally assessed by CT. Muscles and Tendons No intramuscular hematoma or fluid collection. Soft tissues Skin and subcutaneous soft tissues are within normal limits. Prominent vascular calcifications. IMPRESSION: 1. Status post left hip cephalomedullary nail placement. The hardware is intact. No perihardware fracture. 2. Small fracture about the posterior acetabular wall of indeterminate age. 3. No intramuscular hematoma or fluid collection. Electronically Signed   By: Keane Police D.O.   On: 10/17/2022 21:05   CT Lumbar Spine Wo Contrast  Result Date: 11/02/2022 CLINICAL DATA:  Low back pain, trauma. EXAM: CT LUMBAR SPINE WITHOUT CONTRAST TECHNIQUE: Multidetector CT imaging of the lumbar spine was performed without intravenous contrast administration. Multiplanar CT image reconstructions were also generated. RADIATION DOSE REDUCTION: This exam was performed according to the departmental dose-optimization program which includes automated exposure control, adjustment of the mA and/or kV according to patient size and/or use of iterative reconstruction technique. COMPARISON:  Radiographs dated October 13, 2022 FINDINGS: Segmentation: 5 lumbar type vertebrae. Alignment: Grade 1 anterolisthesis of L4, chronic process. Vertebrae: Compression fracture of the L4 vertebral body. There  is a proximally 20% vertebral body height loss. No significant retropulsion of the vertebral body into the spinal canal. Paraspinal and other  soft tissues: Soft tissue swelling and hematoma about the vertebral body. Postsurgical changes for prior left nephrectomy. Advanced atherosclerotic disease of abdominal aorta and bilateral iliac vessels. Disc levels: Advanced multilevel degenerate disc disease with associated facet joint arthropathy. T12-L1: No significant spinal canal or neural foraminal stenosis. L1-L2: Disc osteophyte complex with mild bilateral lateral recess stenosis. Mild facet joint arthropathy. No significant neural foraminal stenosis. L2-L3: Disc osteophyte complex with bilateral lateral recess stenosis. Facet joint arthropathy. No significant neural foraminal stenosis. L3-L4: Mild retropulsion of the superior endplate of the L4 vertebral body into the spinal canal. Moderate bilateral lateral recess stenosis. Mild bilateral neural foraminal stenosis. L4-L5: Disc osteophyte complex with bilateral lateral recess stenosis. Mild to moderate bilateral neural foraminal stenosis, left worse than the right. Moderate bilateral facet joint arthropathy. L5-S1: Disc osteophyte complex with moderate lateral recess stenosis. Moderate-to-severe bilateral facet joint arthropathy. IMPRESSION: 1. Acute compression fracture of the L4 vertebral body with approximately 20% vertebral body height loss. Mild retropulsion of the superior endplate into the spinal canal. Further evaluation with MR examination is suggested. 2. Advanced multilevel degenerate disc disease and facet joint arthropathy as described above. 3. Aortic atherosclerosis. Aortic Atherosclerosis (ICD10-I70.0). Electronically Signed   By: Keane Police D.O.   On: 10/23/2022 20:56   CT PELVIS WO CONTRAST  Result Date: 10/04/2022 CLINICAL DATA:  Hip trauma, fracture suspected.  Low back pain. EXAM: CT PELVIS WITHOUT CONTRAST TECHNIQUE: Multidetector CT imaging of the pelvis was performed following the standard protocol without intravenous contrast. RADIATION DOSE REDUCTION: This exam was  performed according to the departmental dose-optimization program which includes automated exposure control, adjustment of the mA and/or kV according to patient size and/or use of iterative reconstruction technique. COMPARISON:  10/30/2022. FINDINGS: Urinary Tract:  No abnormality visualized. Bowel:  No acute abnormality. Vascular/Lymphatic: Atherosclerotic calcification of the distal abdominal aorta and its tributaries. No abdominal or pelvic lymphadenopathy. Reproductive:  Hysterectomy changes are noted. Other: A small amount of free fluid is present in the pelvis. Small fat containing umbilical hernia. Musculoskeletal: No acute fracture or dislocation at the left hip. Fixation hardware is present in the proximal left femur without evidence of hardware loosening. A compression fracture is noted in the superior endplate of L4, partially visualized on exam. Degenerative changes are noted in the lower lumbar spine. IMPRESSION: 1. No acute fracture or dislocation at the left hip. 2. Compression deformity in the superior endplate of L4, partially visualized on exam. Please refer to CT lumbar spine for additional information. 3. Aortic atherosclerosis. Electronically Signed   By: Brett Fairy M.D.   On: 10/20/2022 20:44   DG Hip Unilat W or Wo Pelvis 2-3 Views Left  Result Date: 10/26/2022 CLINICAL DATA:  Left hip pain. Weakness. Fall 1 month ago. EXAM: DG HIP (WITH OR WITHOUT PELVIS) 2-3V LEFT COMPARISON:  None Available. FINDINGS: Intramedullary nail with trans trochanteric and distal locking screw fixation of remote proximal femur fracture. There is no periprosthetic fracture or lucency. Femoral head is well seated, no dislocation. Intact pubic rami. Pubic symphysis and sacroiliac joints are congruent. The bones are under mineralized. There are vascular calcifications IMPRESSION: 1. No acute fracture or dislocation of the left hip. 2. Hardware in place without complication. Electronically Signed   By: Keith Rake M.D.   On: 10/11/2022 16:14    Pending Labs Unresulted Labs (From admission, onward)  Start     Ordered   10/26/22 0500  CBC  Tomorrow morning,   R        10/25/22 0606   10/25/22 0703  Urine Culture  (Urine Culture)  ONCE - URGENT,   URGENT       Question:  Indication  Answer:  Urgency/frequency   10/25/22 0702   10/25/22 0608  Urine Culture  (Urine Culture)  Add-on,   AD       Question:  Indication  Answer:  Urgency/frequency   10/25/22 0607   10/25/22 0350  Basic metabolic panel  Daily at 5am,   R      10/25/22 0601            Vitals/Pain Today's Vitals   10/25/22 1430 10/25/22 1445 10/25/22 1500 10/25/22 1550  BP: (!) 94/39  (!) 110/41   Pulse: (!) 55 (!) 54 (!) 56   Resp: _0 Temp:    97.9 F (36.6 C)  TempSrc:    Oral  SpO2: 96% 96% 96%   Weight:      Height:      PainSc:        Isolation Precautions No active isolations  Medications Medications  carvedilol (COREG) tablet 12.5 mg (12.5 mg Oral Given 10/25/22 0805)  traMADol (ULTRAM) tablet 50 mg (has no administration in time range)  oxybutynin (DITROPAN) tablet 5 mg (5 mg Oral Given 10/25/22 0806)  levothyroxine (SYNTHROID) tablet 25 mcg (25 mcg Oral Given 10/25/22 0632)  ipratropium-albuterol (DUONEB) 0.5-2.5 (3) MG/3ML nebulizer solution 3 mL (3 mLs Nebulization Given 10/25/22 1550)  insulin glargine-yfgn (SEMGLEE) injection 10 Units (10 Units Subcutaneous Given 10/25/22 0243)  hydrALAZINE (APRESOLINE) tablet 100 mg (0 mg Oral Hold 10/25/22 1332)  fentaNYL (DURAGESIC) 25 MCG/HR 1 patch (0 patches Transdermal Hold 10/25/22 0243)  lidocaine (LIDODERM) 5 % 1 patch (1 patch Transdermal Patch Removed 10/25/22 1505)  HYDROmorphone (DILAUDID) injection 1 mg (1 mg Intravenous Given 10/25/22 1126)  ezetimibe (ZETIA) tablet 10 mg ( Oral Canceled Entry 10/25/22 0158)    And  simvastatin (ZOCOR) tablet 20 mg (has no administration in time range)  0.9 %  sodium chloride infusion ( Intravenous New  Bag/Given 10/25/22 0810)  fentaNYL (DURAGESIC) 25 MCG/HR 1 patch (1 patch Transdermal Patch Applied 10/25/22 0239)  heparin injection 5,000 Units (5,000 Units Subcutaneous Given 10/25/22 1333)  acetaminophen (TYLENOL) tablet 1,000 mg (1,000 mg Oral Given 10/25/22 1125)  senna-docusate (Senokot-S) tablet 1 tablet (has no administration in time range)  ondansetron (ZOFRAN) injection 4 mg (4 mg Intravenous Given 10/25/22 0252)  insulin aspart (novoLOG) injection 0-5 Units (has no administration in time range)  insulin aspart (novoLOG) injection 0-9 Units ( Subcutaneous Not Given 10/25/22 1219)  cefTRIAXone (ROCEPHIN) 1 g in sodium chloride 0.9 % 100 mL IVPB (0 g Intravenous Stopped 10/25/22 0838)  sodium chloride 0.9 % bolus 1,000 mL (0 mLs Intravenous Stopped 10/06/2022 2230)  HYDROmorphone (DILAUDID) injection 0.5 mg (0.5 mg Intravenous Given 10/29/2022 1859)  ondansetron (ZOFRAN) injection 4 mg (4 mg Intravenous Given 10/20/2022 1857)  HYDROmorphone (DILAUDID) injection 0.5 mg (0.5 mg Intravenous Given 11/02/2022 1940)  HYDROmorphone (DILAUDID) injection 1 mg (1 mg Intravenous Given 10/20/2022 2237)    Mobility walks with device Moderate fall risk   Focused Assessments Cardiac Assessment Handoff:    Lab Results  Component Value Date   TROPONINI <0.03 12/01/2017   Lab Results  Component Value Date   DDIMER 0.73 (H) 11/30/2017   Does the Patient  currently have chest pain? No   , Pulmonary Assessment Handoff:  Lung sounds: Bilateral Breath Sounds: Clear L Breath Sounds: Clear R Breath Sounds: Clear O2 Device: Nasal Cannula O2 Flow Rate (L/min): 2 L/min    R Recommendations: See Admitting Provider Note  Report given to:   Additional Notes:

## 2022-10-25 NOTE — ED Notes (Signed)
Patient transported to MRI 

## 2022-10-25 NOTE — Consult Note (Signed)
Reason for Consult:L acetab fx Referring Physician: EDP  Margaret Dyer is an 87 y.o. female.  HPI: [place] approx 1 mo ago reports back and L hip pain. Asked to see for acetabular fx. Reports back is more painful than hip.  Past Medical History:  Diagnosis Date   Asthma    Blood transfusion    Chronic diastolic CHF (congestive heart failure) (HCC)    CKD (chronic kidney disease), stage III (HCC)    Coronary artery calcification seen on CT scan    Diabetes mellitus    Erythropoietin deficiency anemia 09/19/2021   Goiter    Hyperlipidemia    Hypertension    Hypothyroidism    Iron deficiency anemia due to chronic blood loss 09/19/2021   Liver cirrhosis (HCC)    NASH (nonalcoholic steatohepatitis)    Obesity    Osteopenia    Pulmonary hypertension (HCC)    Renal cell carcinoma    a. prior nephrectomy.   Right heart failure (HCC)    Thrombocytopenia ([place])     Past Surgical History:  Procedure Laterality Date   ABDOMINAL HYSTERECTOMY     APPENDECTOMY     HEMORRHOID SURGERY     INTRAMEDULLARY (IM) NAIL INTERTROCHANTERIC Left 12/25/2018   Procedure: INTRAMEDULLARY (IM) NAIL INTERTROCHANTRIC;  Surgeon: Nicholes Stairs, MD;  Location: [facility];  Service: Orthopedics;  Laterality: Left;   KNEE SURGERY     NEPHRECTOMY  09.2000    Family History  Problem Relation Age of Onset   Coronary artery disease Mother    Kidney cancer Father    Cancer Father        renal   COPD Father    Congestive Heart Failure Father    Cancer Sister        bladder   Coronary artery disease Other    Diabetes Other    Hyperlipidemia Other    Hypertension Other    Rheumatologic disease Neg Hx     Social History:  reports that she quit smoking about 48 years ago. Her smoking use included cigarettes. She smoked an average of .25 packs per day. She has never used smokeless tobacco. She reports that she does not drink alcohol and does not use drugs.  Allergies:  Allergies  Allergen Reactions    Other Other (See Comments)    Patient has hemorrhaged at least 4 times in her lifetime   Cefuroxime Axetil Nausea Only        Lisinopril Cough   Bactrim [Sulfamethoxazole-Trimethoprim] Other (See Comments)    Made her very sick, that's all she remembers   Ciprofloxacin Nausea Only    Medications: I have reviewed the patient's current medications.  Results for orders placed or performed during the hospital encounter of 10/09/2022 (from the past 48 hour(s))  CBC     Status: Abnormal   Collection Time: 10/12/2022  3:20 PM  Result Value Ref Range   WBC 5.8 4.0 - 10.5 K/uL   RBC 4.54 3.87 - 5.11 MIL/uL   Hemoglobin 13.3 12.0 - 15.0 g/dL   HCT 40.8 36.0 - 46.0 %   MCV 89.9 80.0 - 100.0 fL   MCH 29.3 26.0 - 34.0 pg   MCHC 32.6 30.0 - 36.0 g/dL   RDW 14.6 11.5 - 15.5 %   Platelets 107 (L) 150 - 400 K/uL   nRBC 0.0 0.0 - 0.2 %    Comment: Performed at [facility], [place] [address], [place], [place] [address]  Comprehensive metabolic panel  Status: Abnormal   Collection Time: 10/12/2022  3:20 PM  Result Value Ref Range   Sodium 136 135 - 145 mmol/L   Potassium 3.6 3.5 - 5.1 mmol/L   Chloride 94 (L) 98 - 111 mmol/L   CO2 25 22 - 32 mmol/L   Glucose, Bld 294 (H) 70 - 99 mg/dL    Comment: Glucose reference range applies only to samples taken after fasting for at least 8 hours.   BUN 57 (H) 8 - 23 mg/dL   Creatinine, Ser 1.94 (H) 0.44 - 1.00 mg/dL   Calcium 9.4 8.9 - 10.3 mg/dL   Total Protein 6.8 6.5 - 8.1 g/dL   Albumin 3.5 3.5 - 5.0 g/dL   AST 36 15 - 41 U/L   ALT 11 0 - 44 U/L   Alkaline Phosphatase 92 38 - 126 U/L   Total Bilirubin 1.2 0.3 - 1.2 mg/dL   GFR, Estimated 25 (L) >60 mL/min    Comment: (NOTE) Calculated using the CKD-EPI Creatinine Equation (2021)    Anion gap 17 (H) 5 - 15    Comment: Performed at [facility], [place] [address], [place], [place] [address]  CBG monitoring, ED     Status: Abnormal   Collection Time: 10/25/22 12:20 AM  Result Value Ref  Range   Glucose-Capillary 196 (H) 70 - 99 mg/dL    Comment: Glucose reference range applies only to samples taken after fasting for at least 8 hours.  CBC     Status: Abnormal   Collection Time: 10/25/22  2:35 AM  Result Value Ref Range   WBC 6.9 4.0 - 10.5 K/uL   RBC 4.39 3.87 - 5.11 MIL/uL   Hemoglobin 12.7 12.0 - 15.0 g/dL   HCT 40.6 36.0 - 46.0 %   MCV 92.5 80.0 - 100.0 fL   MCH 28.9 26.0 - 34.0 pg   MCHC 31.3 30.0 - 36.0 g/dL   RDW 15.1 11.5 - 15.5 %   Platelets 93 (L) 150 - 400 K/uL    Comment: REPEATED TO VERIFY   nRBC 0.0 0.0 - 0.2 %    Comment: Performed at [facility], [place] [address], [place], [place] [address]  Creatinine, serum     Status: Abnormal   Collection Time: 10/25/22  2:35 AM  Result Value Ref Range   Creatinine, Ser 1.82 (H) 0.44 - 1.00 mg/dL   GFR, Estimated 27 (L) >60 mL/min    Comment: (NOTE) Calculated using the CKD-EPI Creatinine Equation (2021) Performed at [facility] [address], [place], [place] [address]   Urinalysis, Routine w reflex microscopic     Status: Abnormal   Collection Time: 10/25/22  2:45 AM  Result Value Ref Range   Color, Urine YELLOW YELLOW   APPearance CLEAR CLEAR   Specific Gravity, Urine 1.013 1.005 - 1.030   pH 5.0 5.0 - 8.0   Glucose, UA 50 (A) NEGATIVE mg/dL   Hgb urine dipstick NEGATIVE NEGATIVE   Bilirubin Urine NEGATIVE NEGATIVE   Ketones, ur NEGATIVE NEGATIVE mg/dL   Protein, ur NEGATIVE NEGATIVE mg/dL   Nitrite NEGATIVE NEGATIVE   Leukocytes,Ua SMALL (A) NEGATIVE   RBC / HPF 0-5 0 - 5 RBC/hpf   WBC, UA 6-10 0 - 5 WBC/hpf   Bacteria, UA RARE (A) NONE SEEN   Squamous Epithelial / LPF 0-5 0 - 5   Mucus PRESENT    Hyaline Casts, UA PRESENT     Comment: Performed at [facility], [place] [address], [place], [place] [address]  Magnesium     Status: None   Collection Time: 10/25/22  6:20 AM  Result Value Ref Range   Magnesium 2.2 1.7 - 2.4 mg/dL    Comment: Performed at [facility] [address], [place], [place] [address]  Protime-INR     Status: Abnormal   Collection Time: 10/25/22  6:20 AM  Result Value Ref Range   Prothrombin Time 16.5 (H) 11.4 - 15.2 seconds   INR 1.3 (H) 0.8 - 1.2    Comment: (NOTE) INR goal varies based on device and disease states. Performed at [facility], [place] [address], [place], [place] [address]   Basic metabolic panel     Status: Abnormal   Collection Time: 10/25/22  6:20 AM  Result Value Ref Range   Sodium 138 135 - 145 mmol/L   Potassium 4.0 3.5 - 5.1 mmol/L   Chloride 101 98 - 111 mmol/L   CO2 24 22 - 32 mmol/L   Glucose, Bld 200 (H) 70 - 99 mg/dL    Comment: Glucose reference range applies only to samples taken after fasting for at least 8 hours.   BUN 54 (H) 8 - 23 mg/dL   Creatinine, Ser 1.73 (H) 0.44 - 1.00 mg/dL   Calcium 8.4 (L) 8.9 - 10.3 mg/dL   GFR, Estimated 29 (L) >60 mL/min    Comment: (NOTE) Calculated using the CKD-EPI Creatinine Equation (2021)    Anion gap 13 5 - 15    Comment: Performed at [facility] [address] [place], [place] [address]  Hemoglobin A1c     Status: Abnormal   Collection Time: 10/25/22  6:20 AM  Result Value Ref Range   Hgb A1c MFr Bld 6.4 (H) 4.8 - 5.6 %    Comment: (NOTE) Pre diabetes:          5.7%-6.4%  Diabetes:              >6.4%  Glycemic control for   <7.0% adults with diabetes    Mean Plasma Glucose 136.98 mg/dL    Comment: Performed at [facility] [address] [place], [place] [address]  CBG monitoring, ED     Status: Abnormal   Collection Time: 10/25/22  7:49 AM  Result Value Ref Range   Glucose-Capillary 186 (H) 70 - 99 mg/dL    Comment: Glucose reference range applies only to samples taken after fasting for at least 8 hours.   Comment 1 Notify RN    Comment 2 Document in Chart    *Note: Due to a large number of results and/or encounters for the requested time period, some results have not been displayed. A complete set of results can  be found in Results Review.    MR LUMBAR SPINE WO CONTRAST  Result Date: 10/17/2022 CLINICAL DATA:  Low back pain, fracture on same-day CT EXAM: MRI LUMBAR SPINE WITHOUT CONTRAST TECHNIQUE: Multiplanar, multisequence MR imaging of the lumbar spine was performed. No intravenous contrast was administered. COMPARISON:  10/04/2022 CT lumbar spine, correlation is also made with 07/02/2019 CT abdomen pelvis FINDINGS: Segmentation:  5 lumbar type vertebral bodies. Alignment:  Mild levocurvature.  Trace anterolisthesis of L4 on L5. Vertebrae: Diffusely decreased T1 and diffusely increased T2 hyperintense signal throughout the L4 vertebral body but most prominent towards the posterior aspect, extending into the bilateral pedicles. Approximately 30% vertebral body height loss, with bowing of the posterior  cortex, which extends approximately 7 mm into the spinal canal (series 6, image 8), concerning for pathologic fracture. Otherwise normal marrow signal and preserved vertebral body heights. Conus medullaris and cauda equina: Conus extends to the L1 level. Conus and cauda equina appear normal. Paraspinal and other soft tissues: Status post left nephrectomy. Right cortical atrophy and scarring. Atrophy of the paraspinous musculature. Disc levels: T12-L1: No significant disc bulge. No spinal canal stenosis or neural foraminal narrowing. L1-L2: Minimal disc bulge with left foraminal protrusion. Mild facet arthropathy. No spinal canal stenosis or neural foraminal narrowing. L2-L3: Minimal disc bulge with left foraminal protrusion. Mild facet arthropathy. No spinal canal stenosis or neural foraminal narrowing. L3-L4: No significant disc bulge. Bowing of the posterior cortex of L4 causes moderate spinal canal stenosis just below the disc level. Narrowing of the lateral recesses. No significant neural foraminal narrowing. L4-L5: Minimal disc bulge. Moderate to severe facet arthropathy. No spinal canal stenosis. Mild left neural  foraminal narrowing. L5-S1: Minimal disc bulge. Mild right and moderate left facet arthropathy. Narrowing of the left lateral recess. No spinal canal stenosis. Mild right neural foraminal narrowing. IMPRESSION: 1. Findings concerning for pathologic fracture of the L4 vertebral body, with approximately 30% vertebral body height loss and bowing of the posterior cortex, which causes moderate spinal canal stenosis posterior to the L4 vertebral body, as well as mild left neural foraminal narrowing at L4-L5. Given the patient's history of renal cell carcinoma, this is concerning for metastasis. 2. L5-S1 mild right neural foraminal narrowing. Narrowing of the left lateral recess at this level could affect the descending left S1 nerve roots. These results were called by telephone at the time of interpretation on 10/18/2022 at 11:41 pm to provider DAVID [facility], who verbally acknowledged these results. Electronically Signed   By: Merilyn Baba M.D.   On: 10/19/2022 23:41   CT Hip Left Wo Contrast  Result Date: 10/31/2022 CLINICAL DATA:  Hip trauma, fracture suspected. EXAM: CT OF THE LEFT HIP WITHOUT CONTRAST TECHNIQUE: Multidetector CT imaging of the left hip was performed according to the standard protocol. Multiplanar CT image reconstructions were also generated. RADIATION DOSE REDUCTION: This exam was performed according to the departmental dose-optimization program which includes automated exposure control, adjustment of the mA and/or kV according to patient size and/or use of iterative reconstruction technique. COMPARISON:  None Available. FINDINGS: Bones/Joint/Cartilage Status post left hip cephalomedullary nail placement. The hardware is intact. No perihardware fracture. There is a small fracture about the posterior acetabular wall of indeterminate age. Ligaments Suboptimally assessed by CT. Muscles and Tendons No intramuscular hematoma or fluid collection. Soft tissues Skin and subcutaneous soft tissues are  within normal limits. Prominent vascular calcifications. IMPRESSION: 1. Status post left hip cephalomedullary nail placement. The hardware is intact. No perihardware fracture. 2. Small fracture about the posterior acetabular wall of indeterminate age. 3. No intramuscular hematoma or fluid collection. Electronically Signed   By: Keane Police D.O.   On: 10/16/2022 21:05   CT Lumbar Spine Wo Contrast  Result Date: 10/11/2022 CLINICAL DATA:  Low back pain, trauma. EXAM: CT LUMBAR SPINE WITHOUT CONTRAST TECHNIQUE: Multidetector CT imaging of the lumbar spine was performed without intravenous contrast administration. Multiplanar CT image reconstructions were also generated. RADIATION DOSE REDUCTION: This exam was performed according to the departmental dose-optimization program which includes automated exposure control, adjustment of the mA and/or kV according to patient size and/or use of iterative reconstruction technique. COMPARISON:  Radiographs dated October 13, 2022 FINDINGS: Segmentation: 5 lumbar type vertebrae.  Alignment: Grade 1 anterolisthesis of L4, chronic process. Vertebrae: Compression fracture of the L4 vertebral body. There is a proximally 20% vertebral body height loss. No significant retropulsion of the vertebral body into the spinal canal. Paraspinal and other soft tissues: Soft tissue swelling and hematoma about the vertebral body. Postsurgical changes for prior left nephrectomy. Advanced atherosclerotic disease of abdominal aorta and bilateral iliac vessels. Disc levels: Advanced multilevel degenerate disc disease with associated facet joint arthropathy. T12-L1: No significant spinal canal or neural foraminal stenosis. L1-L2: Disc osteophyte complex with mild bilateral lateral recess stenosis. Mild facet joint arthropathy. No significant neural foraminal stenosis. L2-L3: Disc osteophyte complex with bilateral lateral recess stenosis. Facet joint arthropathy. No significant neural foraminal  stenosis. L3-L4: Mild retropulsion of the superior endplate of the L4 vertebral body into the spinal canal. Moderate bilateral lateral recess stenosis. Mild bilateral neural foraminal stenosis. L4-L5: Disc osteophyte complex with bilateral lateral recess stenosis. Mild to moderate bilateral neural foraminal stenosis, left worse than the right. Moderate bilateral facet joint arthropathy. L5-S1: Disc osteophyte complex with moderate lateral recess stenosis. Moderate-to-severe bilateral facet joint arthropathy. IMPRESSION: 1. Acute compression fracture of the L4 vertebral body with approximately 20% vertebral body height loss. Mild retropulsion of the superior endplate into the spinal canal. Further evaluation with MR examination is suggested. 2. Advanced multilevel degenerate disc disease and facet joint arthropathy as described above. 3. Aortic atherosclerosis. Aortic Atherosclerosis (ICD10-I70.0). Electronically Signed   By: Keane Police D.O.   On: 10/19/2022 20:56   CT PELVIS WO CONTRAST  Result Date: 10/25/2022 CLINICAL DATA:  Hip trauma, fracture suspected.  Low back pain. EXAM: CT PELVIS WITHOUT CONTRAST TECHNIQUE: Multidetector CT imaging of the pelvis was performed following the standard protocol without intravenous contrast. RADIATION DOSE REDUCTION: This exam was performed according to the departmental dose-optimization program which includes automated exposure control, adjustment of the mA and/or kV according to patient size and/or use of iterative reconstruction technique. COMPARISON:  10/06/2022. FINDINGS: Urinary Tract:  No abnormality visualized. Bowel:  No acute abnormality. Vascular/Lymphatic: Atherosclerotic calcification of the distal abdominal aorta and its tributaries. No abdominal or pelvic lymphadenopathy. Reproductive:  Hysterectomy changes are noted. Other: A small amount of free fluid is present in the pelvis. Small fat containing umbilical hernia. Musculoskeletal: No acute fracture or  dislocation at the left hip. Fixation hardware is present in the proximal left femur without evidence of hardware loosening. A compression fracture is noted in the superior endplate of L4, partially visualized on exam. Degenerative changes are noted in the lower lumbar spine. IMPRESSION: 1. No acute fracture or dislocation at the left hip. 2. Compression deformity in the superior endplate of L4, partially visualized on exam. Please refer to CT lumbar spine for additional information. 3. Aortic atherosclerosis. Electronically Signed   By: Brett Fairy M.D.   On: 10/29/2022 20:44   DG Hip Unilat W or Wo Pelvis 2-3 Views Left  Result Date: 10/08/2022 CLINICAL DATA:  Left hip pain. Weakness. Fall 1 month ago. EXAM: DG HIP (WITH OR WITHOUT PELVIS) 2-3V LEFT COMPARISON:  None Available. FINDINGS: Intramedullary nail with trans trochanteric and distal locking screw fixation of remote proximal femur fracture. There is no periprosthetic fracture or lucency. Femoral head is well seated, no dislocation. Intact pubic rami. Pubic symphysis and sacroiliac joints are congruent. The bones are under mineralized. There are vascular calcifications IMPRESSION: 1. No acute fracture or dislocation of the left hip. 2. Hardware in place without complication. Electronically Signed   By: Keith Rake  M.D.   On: 10/09/2022 16:14    Review of Systems  Musculoskeletal:  Positive for arthralgias, back pain, gait problem, joint swelling and myalgias.  All other systems reviewed and are negative.  Blood pressure (!) 124/43, pulse (!) 56, temperature 98 F (36.7 C), temperature source Oral, resp. rate 13, height _0  (1.6 m), weight 83 kg, SpO2 98 %. Physical Exam Constitutional:      Appearance: Normal appearance.  HENT:     Head: Normocephalic and atraumatic.     Right Ear: External ear normal.     Left Ear: External ear normal.     Nose: Nose normal.     Mouth/Throat:     Pharynx: Oropharynx is clear.  Eyes:      Conjunctiva/sclera: Conjunctivae normal.  Cardiovascular:     Rate and Rhythm: Normal rate.  Pulmonary:     Effort: Pulmonary effort is normal.  Abdominal:     General: Bowel sounds are normal.  Musculoskeletal:     Cervical back: Normal range of motion.     Comments: Tender L lateral hip Mild pain with IR/ER L hip No calf pain or sign of DVT Sensation intact  Neurological:     Mental Status: She is alert.     Assessment/Plan: Nondisplaced L acetabular fx, subacute, stable  PWB with assistance of a walker Admit to medicine for other issues IR dealing with back. May benefit from neuro spine consult given concern for mets Follow in office as outpt 3 weeks for repeat xrays Discussed with Dr Valentino Saxon Deatra Robinson 10/25/2022, 11:39 AM

## 2022-10-26 DIAGNOSIS — R52 Pain, unspecified: Secondary | ICD-10-CM

## 2022-10-26 LAB — COMPREHENSIVE METABOLIC PANEL
ALT: 9 U/L (ref 0–44)
AST: 30 U/L (ref 15–41)
Albumin: 2.6 g/dL — ABNORMAL LOW (ref 3.5–5.0)
Alkaline Phosphatase: 66 U/L (ref 38–126)
Anion gap: 11 (ref 5–15)
BUN: 46 mg/dL — ABNORMAL HIGH (ref 8–23)
CO2: 28 mmol/L (ref 22–32)
Calcium: 8.5 mg/dL — ABNORMAL LOW (ref 8.9–10.3)
Chloride: 102 mmol/L (ref 98–111)
Creatinine, Ser: 1.71 mg/dL — ABNORMAL HIGH (ref 0.44–1.00)
GFR, Estimated: 29 mL/min — ABNORMAL LOW (ref >60)
Glucose, Bld: 192 mg/dL — ABNORMAL HIGH (ref 70–99)
Potassium: 3.9 mmol/L (ref 3.5–5.1)
Sodium: 141 mmol/L (ref 135–145)
Total Bilirubin: 0.8 mg/dL (ref 0.3–1.2)
Total Protein: 5.6 g/dL — ABNORMAL LOW (ref 6.5–8.1)

## 2022-10-26 LAB — CBC WITH DIFFERENTIAL/PLATELET
Abs Immature Granulocytes: 0.03 10*3/uL (ref 0.00–0.07)
Basophils Absolute: 0 10*3/uL (ref 0.0–0.1)
Basophils Relative: 1 %
Eosinophils Absolute: 0.3 10*3/uL (ref 0.0–0.5)
Eosinophils Relative: 5 %
HCT: 38.1 % (ref 36.0–46.0)
Hemoglobin: 11.5 g/dL — ABNORMAL LOW (ref 12.0–15.0)
Immature Granulocytes: 1 %
Lymphocytes Relative: 12 %
Lymphs Abs: 0.6 10*3/uL — ABNORMAL LOW (ref 0.7–4.0)
MCH: 28.5 pg (ref 26.0–34.0)
MCHC: 30.2 g/dL (ref 30.0–36.0)
MCV: 94.5 fL (ref 80.0–100.0)
Monocytes Absolute: 0.6 10*3/uL (ref 0.1–1.0)
Monocytes Relative: 11 %
Neutro Abs: 3.8 10*3/uL (ref 1.7–7.7)
Neutrophils Relative %: 70 %
Platelets: 78 10*3/uL — ABNORMAL LOW (ref 150–400)
RBC: 4.03 MIL/uL (ref 3.87–5.11)
RDW: 15 % (ref 11.5–15.5)
WBC: 5.4 10*3/uL (ref 4.0–10.5)
nRBC: 0 % (ref 0.0–0.2)

## 2022-10-26 LAB — GLUCOSE, CAPILLARY
Glucose-Capillary: 167 mg/dL — ABNORMAL HIGH (ref 70–99)
Glucose-Capillary: 139 mg/dL — ABNORMAL HIGH (ref 70–99)
Glucose-Capillary: 128 mg/dL — ABNORMAL HIGH (ref 70–99)
Glucose-Capillary: 165 mg/dL — ABNORMAL HIGH (ref 70–99)

## 2022-10-26 LAB — LACTATE DEHYDROGENASE: LDH: 149 U/L (ref 98–192)

## 2022-10-26 MED ORDER — ZOLEDRONIC ACID 4 MG/5ML IV CONC
4.0000 mg | Freq: Once | INTRAVENOUS | Status: AC
  Administered 2022-10-26: 4 mg via INTRAVENOUS
  Filled 2022-10-26: qty 5

## 2022-10-26 MED ORDER — SODIUM CHLORIDE 0.9 % IV SOLN: INTRAVENOUS | Status: AC

## 2022-10-26 MED ORDER — POLYETHYLENE GLYCOL 3350 17 G PO PACK
17.0000 g | PACK | Freq: Every day | ORAL | Status: DC
  Administered 2022-10-26 – 2022-10-27 (×2): 17 g via ORAL
  Filled 2022-10-26 (×2): qty 1

## 2022-10-26 MED ORDER — SENNOSIDES-DOCUSATE SODIUM 8.6-50 MG PO TABS
1.0000 | ORAL_TABLET | Freq: Two times a day (BID) | ORAL | Status: AC
  Administered 2022-10-26 – 2022-10-28 (×6): 1 via ORAL
  Filled 2022-10-26 (×6): qty 1
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
## 2022-10-26 NOTE — Progress Notes (Signed)
Orthopedic Tech Progress Note Patient Details:  [name] [birth year] [id number]  LSO is adjusted and at bedside for use only when OOB.   Ortho Devices Type of Ortho Device: Lumbar corsett Ortho Device/Splint Location: at bedside Ortho Device/Splint Interventions: Ordered, Adjustment   Post Interventions Patient Tolerated: Well Instructions Provided: Care of device, Adjustment of device  [name] 10/26/2022, 10:46 AM

## 2022-10-26 NOTE — Progress Notes (Signed)
PROGRESS NOTE    Margaret Dyer  AJO:[id number] DOB: 11/17/1935 DOA: 11/01/2022 PCP: Ann Held, DO     Brief Narrative:  H/o hypothyroidism ,HTN, IDDM2, chronic diastolic CHF, pulmonary hypertension,  [place], liver cirrhosis,,CKD stage III, renal cell carcinoma with left  nephrectomy in 2000,  presented with intractable back pain , found to have Innumerable bilateral pulmonary metastases.,   L4 fracture concerning for pathological fracture,  Posterior left acetabular wall fracture of indeterminate age , uti   Subjective:  Reports tramadol and lidocain patch does not help her back pain, she wants to try iv dilaudid, she is currently having a fentanyl patch Reports has tendency to have constipation,   Reports urinary symptom   Assessment & Plan:  Principal Problem:   Intractable pain Active Problems:   Hyperlipidemia   Gout, unspecified   Essential hypertension   Asthma   Anxiety and depression   Platelets decreased (HCC)   Diabetes mellitus type 2, insulin dependent ([place])   History of nephrectomy   Liver cirrhosis secondary to NASH (HCC)   Thrombocytopenia (HCC)   Hypothyroidism   Chronic hypoxemic respiratory failure (HCC)   Chronic diastolic CHF (congestive heart failure) (HCC)   Pulmonary hypertension (HCC)   Chronic renal disease, stage 4, severely decreased glomerular filtration rate (GFR) between 15-29 mL/min/1.73 square meter (HCC)   OSA (obstructive sleep apnea)   Restrictive lung disease   Pathological fracture   intractable back pain with compression fracture of L4 with concern for pathological fracture and metastasis -MRI, CT of the pelvis showed compression fracture of L4, ?  Pathological with 30% vertebral height loss -Interventional radiology consulted for possible vertebroplasty and bone biopsy -D/w interventional radiology, approved for vertebroplasty, recommended to hold aspirin, will start insurance authorization     History of renal cell  CA with left nephrectomy -Innumerable bilateral pulmonary metastases on staging CT scan recommended by  Dr. Marin Olp,  need biopsy to assess if renal CA versus another primary cancer with metastasis   Posterior left acetabular wall fracture of indeterminate age -CT of the left hip showed small fracture of posterior acetabular wall of indeterminate age -[facility]en by [facility] on 11/22  who recommend PWB with assistance of a walker, Follow in office as outpt 3 weeks for repeat xrays   Klebsiella UTI -Patient was recently diagnosed with UTI, urine culture showing Klebsiella.   - she has only taken 2 days of oral antibiotics, Augmentin -UA + UTI, follow urine cultures, placed on IV Rocephin   CKD stage IV, history of renal cell carcinoma with left  nephrectomy -Creatinine 1.94 on admission, improving to 1.7.  Creatinine 2.4 on 10/13/2022 - patient was placed on IV fluid hydration, patient's nephrologist had recently DC'd valsartan and furosemide.  Creatinine since then improving.   DM type II, IDDM, on long-term insulin with CKD stage IV -Resumed carb modified diet, sensitive sliding scale insulin, Lantus 10 units daily HbA1c 6.4 CBG (last 3)  Recent Labs    10/26/22 0802 10/26/22 1127 10/26/22 1555  GLUCAP 167* 139* 128*      COPD /Chronic hypoxic respiratory failure on home o2 3.5liters Stable, no acute issues  Thrombocytopenia, chronic Does has ecchymosis ,but no overt bleeding Management per hematology oncology Dr. Marin Olp   The patient's BMI is: Body mass index is 32.41 kg/m..meet obesity criteria   .         I have Reviewed nursing notes, Vitals, pain scores, I/o's, Lab results and  imaging results since pt's  last encounter, details please see discussion above  I ordered the following labs:  Unresulted Labs (From admission, onward)     Start     Ordered   10/27/22 2694  Basic metabolic panel  Daily at 5am,   R      10/26/22 1825   10/26/22 0714  AFP tumor  marker  Once,   AD        10/26/22 0714   10/26/22 0500  CBC  Tomorrow morning,   R        10/25/22 0606   10/25/22 0703  Urine Culture  (Urine Culture)  ONCE - URGENT,   URGENT       Question:  Indication  Answer:  Urgency/frequency   10/25/22 0702   10/25/22 8546  Urine Culture  (Urine Culture)  Add-on,   AD       Question:  Indication  Answer:  Urgency/frequency   10/25/22 0607             DVT prophylaxis: heparin injection 5,000 Units Start: 10/25/22 1400 SCDs Start: 10/25/22 0220   Code Status:   Code Status: Full Code  Family Communication: she declined my offer to call her family, "  I donot want to ruin their thanksgiving, I will call them"  Disposition:   Dispo: The patient is from: lives with her daughter , walk with a walker at baseline              Anticipated d/c is to: TBD              Anticipated d/c date is: TBD, needs IR intervention, needs biopsy, oncology will contact rad onc for XRT for her back  Antimicrobials:    Anti-infectives (From admission, onward)    Start     Dose/Rate Route Frequency Ordered Stop   10/25/22 0715  cefTRIAXone (ROCEPHIN) 1 g in sodium chloride 0.9 % 100 mL IVPB        1 g 200 mL/hr over 30 Minutes Intravenous Every 24 hours 10/25/22 0701            Objective: Vitals:   10/26/22 0805 10/26/22 0912 10/26/22 1434 10/26/22 1544  BP: (!) 123/47  (!) 137/49   Pulse: (!) 58  61   Resp: 18  17   Temp: 98.2 F (36.8 C)  98.2 F (36.8 C)   TempSrc: Oral  Oral   SpO2: 98% 98% 99% 98%  Weight:      Height:        Intake/Output Summary (Last 24 hours) at 10/26/2022 1836 Last data filed at 10/26/2022 1601 Gross per 24 hour  Intake 1660.84 ml  Output 500 ml  Net 1160.84 ml   Filed Weights   11/01/2022 1457  Weight: 83 kg    Examination:  General exam: frail elderly, alert, awake, communicative,calm, NAD Respiratory system: Clear to auscultation. Respiratory effort normal. Cardiovascular system:  RRR.   Gastrointestinal system: Abdomen is nondistended, soft and nontender.  Normal bowel sounds heard. Central nervous system: Alert and oriented. No focal neurological deficits. Extremities:  no edema Skin: diffuse echymosis , reports chronic,  Psychiatry: Judgement and insight appear normal. Mood & affect appropriate.     Data Reviewed: I have personally reviewed  labs and visualized  imaging studies since the last encounter and formulate the plan        Scheduled Meds:  acetaminophen  1,000 mg Oral Q6H   carvedilol  12.5 mg Oral BID WC   ezetimibe  10 mg Oral Daily   And   simvastatin  20 mg Oral q1800   [START ON 10/28/2022] fentaNYL  1 patch Transdermal Q72H   fentaNYL  1 patch Transdermal Once   heparin  5,000 Units Subcutaneous Q8H   hydrALAZINE  100 mg Oral Q8H   insulin aspart  0-5 Units Subcutaneous QHS   insulin aspart  0-9 Units Subcutaneous TID WC   insulin glargine-yfgn  10 Units Subcutaneous QHS   ipratropium-albuterol  3 mL Nebulization TID   levothyroxine  25 mcg Oral Q MTWThF   lidocaine  1 patch Transdermal QHS   oxybutynin  5 mg Oral Daily   polyethylene glycol  17 g Oral Daily   senna-docusate  1 tablet Oral BID   Continuous Infusions:  sodium chloride     cefTRIAXone (ROCEPHIN)  IV 1 g (10/26/22 0603)     LOS: 1 day     Florencia Reasons, MD PhD FACP [facility]  Available via Epic secure chat 7am-7pm for nonurgent issues Please page for urgent issues To page the attending provider between 7A-7P or the covering provider during after hours 7P-7A, please log into the web site [email or web address] and access using universal [place] password for that web site. If you do not have the password, please call the hospital operator.    10/26/2022, 6:36 PM

## 2022-10-26 NOTE — Progress Notes (Signed)
  Request seen for Biopsy and Kyphoplasty.  Concern for pathologic fracture.  Images reviewed by Dr. Karenann Cai  She would be a good candidate for Osteocool ablation with KP and biopsy.  This procedure requires insurance authorization.   Will send request on Friday and plan for possibly  next week.  Johann Gascoigne S Aleila Syverson PA-C 10/26/2022 10:53 AM

## 2022-10-26 NOTE — Plan of Care (Signed)
  Problem: Health Behavior/Discharge Planning: Goal: Ability to manage health-related needs will improve Outcome: Progressing   Problem: Metabolic: Goal: Ability to maintain appropriate glucose levels will improve Outcome: Progressing   Problem: Nutritional: Goal: Maintenance of adequate nutrition will improve Outcome: Progressing   Problem: Skin Integrity: Goal: Risk for impaired skin integrity will decrease Outcome: Progressing   Problem: Tissue Perfusion: Goal: Adequacy of tissue perfusion will improve Outcome: Progressing

## 2022-10-27 DIAGNOSIS — Z515 Encounter for palliative care: Secondary | ICD-10-CM | POA: Diagnosis not present

## 2022-10-27 DIAGNOSIS — R52 Pain, unspecified: Secondary | ICD-10-CM | POA: Diagnosis not present

## 2022-10-27 DIAGNOSIS — Z7189 Other specified counseling: Secondary | ICD-10-CM | POA: Diagnosis not present

## 2022-10-27 LAB — BASIC METABOLIC PANEL
Anion gap: 12 (ref 5–15)
BUN: 43 mg/dL — ABNORMAL HIGH (ref 8–23)
CO2: 26 mmol/L (ref 22–32)
Calcium: 8.5 mg/dL — ABNORMAL LOW (ref 8.9–10.3)
Chloride: 104 mmol/L (ref 98–111)
Creatinine, Ser: 1.68 mg/dL — ABNORMAL HIGH (ref 0.44–1.00)
GFR, Estimated: 30 mL/min — ABNORMAL LOW (ref >60)
Glucose, Bld: 138 mg/dL — ABNORMAL HIGH (ref 70–99)
Potassium: 3.8 mmol/L (ref 3.5–5.1)
Sodium: 142 mmol/L (ref 135–145)

## 2022-10-27 LAB — GLUCOSE, CAPILLARY
Glucose-Capillary: 114 mg/dL — ABNORMAL HIGH (ref 70–99)
Glucose-Capillary: 158 mg/dL — ABNORMAL HIGH (ref 70–99)
Glucose-Capillary: 122 mg/dL — ABNORMAL HIGH (ref 70–99)
Glucose-Capillary: 164 mg/dL — ABNORMAL HIGH (ref 70–99)

## 2022-10-27 MED ORDER — LORAZEPAM 0.5 MG PO TABS
0.5000 mg | ORAL_TABLET | Freq: Every day | ORAL | Status: DC | PRN
  Administered 2022-10-29 – 2022-11-15 (×10): 0.5 mg via ORAL
  Filled 2022-10-27 (×10): qty 1

## 2022-10-27 MED ORDER — ENSURE ENLIVE PO LIQD
237.0000 mL | Freq: Three times a day (TID) | ORAL | Status: DC
  Administered 2022-10-28: 237 mL via ORAL
  Administered 2022-10-28: 237 via ORAL
  Administered 2022-10-29 – 2022-11-22 (×60): 237 mL via ORAL

## 2022-10-27 MED ORDER — IPRATROPIUM-ALBUTEROL 0.5-2.5 (3) MG/3ML IN SOLN
3.0000 mL | Freq: Four times a day (QID) | RESPIRATORY_TRACT | Status: DC | PRN
  Administered 2022-10-28 – 2022-11-01 (×7): 3 mL via RESPIRATORY_TRACT
  Filled 2022-10-27 (×7): qty 3

## 2022-10-27 MED ORDER — ONDANSETRON HCL 4 MG/2ML IJ SOLN
4.0000 mg | Freq: Three times a day (TID) | INTRAMUSCULAR | Status: DC
  Administered 2022-10-27 – 2022-10-29 (×6): 4 mg via INTRAVENOUS
  Filled 2022-10-27 (×7): qty 2

## 2022-10-27 MED ORDER — GABAPENTIN 100 MG PO CAPS
100.0000 mg | ORAL_CAPSULE | Freq: Every day | ORAL | Status: DC
  Administered 2022-10-27 – 2022-11-08 (×13): 100 mg via ORAL
  Filled 2022-10-27 (×13): qty 1

## 2022-10-27 MED ORDER — POLYETHYLENE GLYCOL 3350 17 G PO PACK
17.0000 g | PACK | Freq: Two times a day (BID) | ORAL | Status: AC
  Filled 2022-10-27 (×3): qty 1

## 2022-10-27 MED ORDER — BISACODYL 10 MG RE SUPP
10.0000 mg | Freq: Every day | RECTAL | Status: AC
  Administered 2022-10-27: 10 mg via RECTAL
  Filled 2022-10-27 (×2): qty 1

## 2022-10-27 MED ORDER — IPRATROPIUM-ALBUTEROL 0.5-2.5 (3) MG/3ML IN SOLN
3.0000 mL | Freq: Three times a day (TID) | RESPIRATORY_TRACT | Status: DC
  Administered 2022-10-27 (×3): 3 mL via RESPIRATORY_TRACT
  Filled 2022-10-27 (×3): qty 3

## 2022-10-27 MED ORDER — KETOROLAC TROMETHAMINE 15 MG/ML IJ SOLN
15.0000 mg | Freq: Two times a day (BID) | INTRAMUSCULAR | Status: DC
  Administered 2022-10-27 – 2022-10-28 (×2): 15 mg via INTRAVENOUS
  Filled 2022-10-27 (×2): qty 1

## 2022-10-27 MED ORDER — ADULT MULTIVITAMIN W/MINERALS CH
1.0000 | ORAL_TABLET | Freq: Every day | ORAL | Status: DC
  Administered 2022-10-28 – 2022-11-21 (×24): 1 via ORAL
  Filled 2022-10-27 (×25): qty 1

## 2022-10-27 NOTE — Plan of Care (Signed)
  Problem: Health Behavior/Discharge Planning: Goal: Ability to manage health-related needs will improve Outcome: Progressing   Problem: Nutritional: Goal: Maintenance of adequate nutrition will improve Outcome: Progressing   Problem: Skin Integrity: Goal: Risk for impaired skin integrity will decrease Outcome: Progressing   Problem: Activity: Goal: Risk for activity intolerance will decrease Outcome: Progressing   Problem: Coping: Goal: Level of anxiety will decrease Outcome: Progressing

## 2022-10-27 NOTE — Progress Notes (Signed)
Initial Nutrition Assessment  DOCUMENTATION CODES:   Not applicable  INTERVENTION:  Once diet resumes, recommend: Speech therapy consult to assess swallow status Regular diet to provide widest variety of menu options Ensure Plus High Protein po TID, each supplement provides 350 kcal and 20 grams of protein (chocolate) MVI with minerals daily "High Calorie, High Protein Nutrition Therapy" handout added to AVS  NUTRITION DIAGNOSIS:   Increased nutrient needs related to acute illness, chronic illness as evidenced by estimated needs.  GOAL:   Patient will meet greater than or equal to 90% of their needs  MONITOR:   PO intake, Supplement acceptance, Labs, Weight trends  REASON FOR ASSESSMENT:   Malnutrition Screening Tool    ASSESSMENT:   Pt admitted with intractable back pain. PMH significant for chronic respiratory failure, CHF, renal cell carcinoma s/p L nephrectomy (2000), liver cirrhosis, diabetes.  Oncology following. Recommend biopsy of lung nodule. IR also consulted for possible vertebroplasty and bone biopsy.   No documented meal completions. Observed breakfast tray on counter with barely 10% eaten.  Pt reports that she had a fall in the shower about a month ago and re-injured her hip that he broke about 3 years ago. She endorses a poor and declining appetite for a while but more significantly (about 80-90% decrease in PO intake from her baseline) within the last month since her most recent fall. She has been living with her daughter. Her daughter has been preparing her smoothies as these have been easy for her to tolerate. Encouraged her to have her daughter bring her smoothies when she comes to visit.   Pt reports that within the last month she has had to be very careful when swallowing water as she feels like it gets stuck.   Pt states that her usual weight had been around 187 lbs and she endorses a weight loss of about 5 lbs within the last month.   Per review of  weight history, it appears she has had a weight loss of 7.9% since 03/10 which is concerning but not clinically significant for time frame.   Pt does not currently meet criteria for malnutrition however she is at high risk given her PO intake is significantly decreased and her current health complications.   Medications: SSI 0-5 units qhs, SSI 0-9 units TID, semglee 10 units daily, miralax, senna, abx  Labs: BUN 43, Cr 1.68, GFR 30, HgbA1c 6.4%, CBG's 114-167 x24 hours  NUTRITION - FOCUSED PHYSICAL EXAM:  Flowsheet Row Most Recent Value  Orbital Region No depletion  Upper Arm Region No depletion  Thoracic and Lumbar Region No depletion  Buccal Region No depletion  Temple Region No depletion  Clavicle Bone Region No depletion  Clavicle and Acromion Bone Region No depletion  Scapular Bone Region No depletion  Dorsal Hand Mild depletion  Patellar Region Mild depletion  Anterior Thigh Region Mild depletion  Posterior Calf Region Mild depletion  Edema (RD Assessment) Mild  [non pitting BLE]  Hair Reviewed  Eyes Reviewed  Mouth Reviewed  Skin Other (Comment)  [heavy bruising on arms and calves]  Nails Reviewed      Diet Order:   Diet Order             Diet NPO time specified Except for: Sips with Meds  Diet effective now                   EDUCATION NEEDS:   Education needs have been addressed  Skin:  Skin Assessment: Reviewed  RN Assessment  Last BM:  11/20  Height:   Ht Readings from Last 1 Encounters:  10/10/2022 _0  (1.6 m)    Weight:   Wt Readings from Last 1 Encounters:  10/17/2022 83 kg   BMI:  Body mass index is 32.41 kg/m.  Estimated Nutritional Needs:   Kcal:  1800-2000  Protein:  90-105g  Fluid:  >/=1.8L  Clayborne Dana, RDN, LDN Clinical Nutrition

## 2022-10-27 NOTE — Progress Notes (Signed)
PROGRESS NOTE    Margaret Dyer  KYH:[id number] DOB: Aug 03, 1935 DOA: 10/18/2022 PCP: Ann Held, DO     Brief Narrative:  H/o hypothyroidism ,HTN, IDDM2, chronic diastolic CHF, pulmonary hypertension,  [place], liver cirrhosis,,CKD stage III, renal cell carcinoma with left  nephrectomy in 2000,  presented with intractable back pain , found to have Innumerable bilateral pulmonary metastases.,   L4 fracture concerning for pathological fracture,  Posterior left acetabular wall fracture of indeterminate age , uti   Subjective:  Reports tramadol and lidocain patch does not help her back pain, she wants to try iv dilaudid, she is currently having a fentanyl patch Reports has tendency to have constipation, no bm for the last 4-5 days  She appear frail, daughter at bedside    Assessment & Plan:  Principal Problem:   Intractable pain Active Problems:   Hyperlipidemia   Gout, unspecified   Essential hypertension   Asthma   Anxiety and depression   Platelets decreased ([place])   Diabetes mellitus type 2, insulin dependent ([place])   History of nephrectomy   Liver cirrhosis secondary to NASH (HCC)   Thrombocytopenia (HCC)   Hypothyroidism   Chronic hypoxemic respiratory failure (HCC)   Chronic diastolic CHF (congestive heart failure) (HCC)   Pulmonary hypertension (HCC)   Chronic renal disease, stage 4, severely decreased glomerular filtration rate (GFR) between 15-29 mL/min/1.73 square meter (HCC)   OSA (obstructive sleep apnea)   Restrictive lung disease   Pathological fracture   Intractable back pain with compression fracture of L4 with concern for pathological fracture and metastasis -MRI, CT of the pelvis showed compression fracture of L4, ?  Pathological with 30% vertebral height loss -Interventional radiology consulted for vertebroplasty/ablation and bone biopsy -hold aspirin -Pain control with bowel regimen      History of renal cell CA with left  nephrectomy -Innumerable bilateral pulmonary metastases on staging CT scan recommended by  Dr. Marin Olp,  need biopsy to assess if renal CA versus another primary cancer with metastasis Current plan bone biopsy and lung mass biopsy    Posterior left acetabular wall fracture of indeterminate age -CT of the left hip showed small fracture of posterior acetabular wall of indeterminate age -Seen by [facility] on 11/22  who recommend PWB with assistance of a walker, Follow in office as outpt 3 weeks for repeat xrays   Klebsiella UTI -Patient was recently diagnosed with UTI, urine culture showing Klebsiella.   - she has only taken 2 days of oral antibiotics, Augmentin, then 3days of iv rocephin in the hospital -UA + UTI, follow urine cultures ordered ,but it does not appear collected,     CKD stage IV, history of renal cell carcinoma with left  nephrectomy -Creatinine 1.94 on admission, improving to 1.7.  Creatinine 2.4 on 10/13/2022 - patient was placed on IV fluid hydration, patient's nephrologist had recently DC'd valsartan and furosemide.  Creatinine since then improving.   DM type II, IDDM, on long-term insulin with CKD stage IV -Resumed carb modified diet, sensitive sliding scale insulin, Lantus 10 units daily HbA1c 6.4 CBG (last 3)  Recent Labs    10/26/22 2013 10/27/22 0804 10/27/22 1235  GLUCAP 165* 114* 158*      COPD /Chronic hypoxic respiratory failure on home o2 3.5liters Stable, no acute issues  Thrombocytopenia, chronic Does has ecchymosis ,but no overt bleeding Management per hematology oncology Dr. Marin Olp   The patient's BMI is: Body mass index is 32.41 kg/m..meet obesity criteria   .  FTT, with new metastatic cancer diagnosis, family desires to talk to palliative care, palliative care consulted , input appreciated       I have Reviewed nursing notes, Vitals, pain scores, I/o's, Lab results and  imaging results since pt's last encounter, details please  see discussion above  I ordered the following labs:  Unresulted Labs (From admission, onward)     Start     Ordered   10/28/22 0500  CBC with Differential/Platelet  Tomorrow morning,   R        10/27/22 0715   10/27/22 0347  Basic metabolic panel  Daily at 5am,   R      10/26/22 1825   10/26/22 0714  AFP tumor marker  Once,   AD        10/26/22 0714   10/25/22 0703  Urine Culture  (Urine Culture)  ONCE - URGENT,   URGENT       Question:  Indication  Answer:  Urgency/frequency   10/25/22 0702   10/25/22 4259  Urine Culture  (Urine Culture)  Add-on,   AD       Question:  Indication  Answer:  Urgency/frequency   10/25/22 0607             DVT prophylaxis: heparin injection 5,000 Units Start: 10/25/22 1400 SCDs Start: 10/25/22 0220   Code Status:   Code Status: Full Code  Family Communication: daughter [name]  at bedside , daughter [name] over the phone per Dr Elsworth Soho request Disposition:   Dispo: The patient is from: lives with her daughter , walk with a walker at baseline              Anticipated d/c is to: TBD              Anticipated d/c date is: TBD, needs IR intervention, needs biopsy, oncology will contact rad onc for XRT for her back  Antimicrobials:    Anti-infectives (From admission, onward)    Start     Dose/Rate Route Frequency Ordered Stop   10/25/22 0715  cefTRIAXone (ROCEPHIN) 1 g in sodium chloride 0.9 % 100 mL IVPB        1 g 200 mL/hr over 30 Minutes Intravenous Every 24 hours 10/25/22 0701            Objective: Vitals:   10/27/22 0419 10/27/22 0807 10/27/22 0856 10/27/22 1316  BP: (!) 116/42 (!) 121/43  126/63  Pulse: 62 69  65  Resp: _0 Temp:  98 F (36.7 C)  97.8 F (36.6 C)  TempSrc:    Oral  SpO2: 97% 98% 99% 97%  Weight:      Height:        Intake/Output Summary (Last 24 hours) at 10/27/2022 1331 Last data filed at 10/27/2022 1000 Gross per 24 hour  Intake 774.67 ml  Output 500 ml  Net 274.67 ml   Filed Weights    10/16/2022 1457  Weight: 83 kg    Examination:  General exam: frail elderly, alert, awake, communicative,calm, NAD Respiratory system: Clear to auscultation. Respiratory effort normal. Cardiovascular system:  RRR.  Gastrointestinal system: Abdomen is nondistended, soft and nontender.  Normal bowel sounds heard. Central nervous system: Alert and oriented. No focal neurological deficits. Extremities:  no edema Skin: diffuse echymosis , reports chronic,  Psychiatry: Judgement and insight appear normal. Mood & affect appropriate.     Data Reviewed: I have personally reviewed  labs and visualized  imaging studies since  the last encounter and formulate the plan        Scheduled Meds:  acetaminophen  1,000 mg Oral Q6H   carvedilol  12.5 mg Oral BID WC   ezetimibe  10 mg Oral Daily   And   simvastatin  20 mg Oral q1800   [START ON 10/28/2022] feeding supplement  237 mL Oral TID BM   [START ON 10/28/2022] fentaNYL  1 patch Transdermal Q72H   fentaNYL  1 patch Transdermal Once   heparin  5,000 Units Subcutaneous Q8H   hydrALAZINE  100 mg Oral Q8H   insulin aspart  0-5 Units Subcutaneous QHS   insulin aspart  0-9 Units Subcutaneous TID WC   insulin glargine-yfgn  10 Units Subcutaneous QHS   ipratropium-albuterol  3 mL Nebulization TID   levothyroxine  25 mcg Oral Q MTWThF   lidocaine  1 patch Transdermal QHS   [START ON 10/28/2022] multivitamin with minerals  1 tablet Oral Daily   oxybutynin  5 mg Oral Daily   polyethylene glycol  17 g Oral Daily   senna-docusate  1 tablet Oral BID   Continuous Infusions:  sodium chloride 75 mL/hr at 10/26/22 2131   cefTRIAXone (ROCEPHIN)  IV 1 g (10/27/22 0627)     LOS: 2 days     Florencia Reasons, MD PhD FACP [facility]  Available via Epic secure chat 7am-7pm for nonurgent issues Please page for urgent issues To page the attending provider between 7A-7P or the covering provider during after hours 7P-7A, please log into the web site  [email or web address] and access using universal [place] password for that web site. If you do not have the password, please call the hospital operator.    10/27/2022, 1:31 PM

## 2022-10-27 NOTE — Progress Notes (Signed)
Orthopedic Tech Progress Note Patient Details:  Margaret Dyer [birth year] [id number]  Patient ID: [name], female   DOB: 1935-03-23, 87 y.o.   MRN: [id number] Reached out to NP via secure chat for her "apply other prosthetic device" order because no brace was specified within the orders or comments, awaiting response. Vernona Rieger 10/27/2022, 5:45 PM

## 2022-10-27 NOTE — Consult Note (Addendum)
Palliative Medicine Inpatient Consult Note  Consulting Provider: Dr. Erlinda Hong  Reason for consult:   [place] Palliative Medicine Consult   Symptom Management Consult  Reason for Consult? goals of care discussion, daughter started to ask questions regarding prognosis, she states family went through this with palliative care for her dad, daughters would like to talk to palliative care, likely metastatic renal cell carcinoma. pain control   10/27/2022  HPI:  Per intake H&P --> Margaret Dyer is a 87 y.o. F with a H/o hypothyroidism ,HTN, IDDM2, chronic diastolic CHF, pulmonary hypertension,  [place]ash, liver cirrhosis,,CKD stage III, renal cell carcinoma with left  nephrectomy in 2000. She presented with intractable back pain , found to have Innumerable bilateral pulmonary metastases.,   L4 fracture concerning for pathological fracture. Oncology consulted, biopsies of lumbar spine and lung are pending.   Palliative care has been asked to get involved to support goals of care conversations in the setting of likely metastatic cancer.   Clinical Assessment/Goals of Care:  *Please note that this is a verbal dictation therefore any spelling or grammatical errors are due to the "[name]" system interpretation.  I have reviewed medical records including EPIC notes, labs and imaging, received report from bedside RN, assessed the patient who is lying in bed in NAD.    I met with Margaret Dyer and her daughter, Margaret Dyer to further discuss diagnosis prognosis, GOC, EOL wishes, disposition and options.   I introduced Palliative Medicine as specialized medical care for people living with serious illness. It focuses on providing relief from the symptoms and stress of a serious illness. The goal is to improve quality of life for both the patient and the family.  Medical History Review and Understanding:  A review of Margaret Dyer's history was completed inclusive of her prior renal cell carcinoma  resulting in a left nephrectomy, liver cirrhosis, chronic kidney disease, heart failure, type 2 diabetes, and hypertension.   Social History:  Margaret Dyer is from [name].  She was married though her husband passed away 5 years ago.  She has 2 daughters, 3 grandchildren and 2 great-grandchildren.  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  Functional and Nutritional State:  Prior to admission Margaret Dyer had been living with her daughter Margaret Dyer.  Margaret Dyer was able to do many things for herself and mobilized with a walker until roughly a month ago whereby she fell backwards into the shower and came to the emergency department where she had a x-ray of her lumbar spine which did not show any acute fractures.  She has had a poor appetite at home since that time and unrelenting pain.  Palliative Symptoms:  Lower back pain which radiates down her leg.  Feels generally achy and causes neuropathy to the point whereby no one can touch her legs.  Is presently on a fentanyl patch and receiving breakthrough doses of Dilaudid which are helping.  She shares that years ago she was on gabapentin and is willing to start this again this evening.  Discussed additional modalities of pain relief inclusive of steroids, muscle relaxer's, and anti-inflammatories for a short period of time. Reviewed that in the setting of her advanced age we need to be cautious and add medications slowly.    Insomnia which is notable while in the hospital.  Reviewed that initiating gabapentin will likely help this symptoms.  Anxiety - has used ativan in the past which did offer short term relief.   Decreased appetite-will  speak to nursing about properly documenting oral intake percentages.  Have encouraged food from the outside that patient finds palatable.  Will request a nutrition consultation.  Nausea - consistent, will order zofran prior to meals  Generalized weakness - PT/OT after kyphoplasty is  done  Advance Directives:  A detailed discussion was had today regarding advanced directives. A copy was provided to patient and her daughter for review and completion.    Code Status:  Concepts specific to code status, artifical feeding and hydration, continued IV antibiotics and rehospitalization was had.  The difference between a aggressive medical intervention path  and a palliative comfort care path for this patient at this time was had.   Encouraged patient/family to consider DNR/DNI status understanding evidenced based poor outcomes in similar hospitalized patient, as the cause of arrest is likely associated with advanced chronic/terminal illness rather than an easily reversible acute cardio-pulmonary event. I explained that DNR/DNI does not change the medical plan and it only comes into effect after a person has arrested (died).  It is a protective measure to keep [place] from harming the patient in their last moments of life.  I strongly recommended that [name] look at the  [employer] for [employer]" booklet. I was honest in sharing that she would likely suffer more pain and debility as a consequence of resuscitation, and verbalized the reality that she would still have this underlying incurable cancer. She vocalizes understanding and shares that she would like time to consider this further.   Discussion:  A review of Margaret Dyer's CT scans and MRI scans were complete.  We reviewed per Dr. Antonieta Pert notes and discussions the plan for vertebroplasty and bone biopsy of her spine.  We discussed the plan for a lung biopsy as well.  Lianni expresses that she has some fears related to biopsies and the potential pain she will experience afterwards.  I tried to explain to her that often prior to biopsies there are conscious sedatives administered and after completion of these studies the palliative care team would help to further manage pain.  Discussed goals at this time are that patient can continue  living and doing so and less pain.  Reviewed the differences between palliative support and hospice support.  We further discussed that much of the future is unclear at this time though it is known that [place] will not be a candidate for chemotherapy and the goal with radiotherapy would be to decrease tumor burden and cause relief of discomfort.  I shared that Dr. Marin Olp would further support discussions if and when hospice is appropriate.  Discussed the importance of continued conversation with family and their  medical providers regarding overall plan of care and treatment options, ensuring decisions are within the context of the patients values and GOCs.  Decision Maker: [name] (Daughter): [phone] (Mobile)   SUMMARY OF RECOMMENDATIONS   Full Code --> patient has the hard choices for loving people booklet which she is reviewing in greater detail, we will continue to broach this topic in the oncoming days  At this time patient is motivated to go through with the kyphoplasty as a modality to help with pain relief   Jannis is agreeable to pursue biopsies to identify the extent of disease and/or get a formal treatment plan from oncology  Advanced directives provided for review and completion--> our chaplain team will further help with these on Monday  Reviewed the differences between palliative care and hospice care  Symptom support as below  PMT will  continue to support Vaughan Basta during her hospitalization in an effort to maintain appropriate symptom management  Code Status/Advance Care Planning: FULL CODE   Symptom Management:  Severe lower back pain d/t pathological fracture: -Currently has a fentanyl patch at 25 mcg -Dilaudid 1 mg IV push every 3 hours as needed (x2 doses in the last 24/hrs) -Continue Tylenol 1 g every 6 hours -Will DC tramadol at this time -Start gabapentin 100 mg nightly -Tordol 62m IVP Q12H x48hrs -Can consider low dose steroids which will also  support nausea in the near future -Can consider flexeril though cautiously given patients age -Appreciate Orthotics fitting for brace  Nausea: - Will make zofran standing before meals and at bedside--> QTC reviewed  Insomnia: -Gabapentin started as above -Can consider melatonin if needed  FTT in Adult: - Dietary consultation  Anxiety: - Ativan 0.570mTab Qday PRN  Generalized weakness: - Will need rehab recommendations after Kyphoplasty  Advance Care Planning: - Appreciate Chaplain supporting completion of documents  Palliative Prophylaxis:  Aspiration, Bowel Regimen, Delirium Protocol, Frequent Pain Assessment, Oral Care, Palliative Wound Care, and Turn Reposition  Additional Recommendations (Limitations, Scope, Preferences): Continue current care   Psycho-social/Spiritual:  Desire for further Chaplaincy support: Yes [facility]: Discussion of metastatic renal cell carcinoma short-term and long-term outlook's   Prognosis: Defer to oncology team  Discharge Planning: Discharge plan is uncertain at this time  Vitals:   10/27/22 1316 10/27/22 1459  BP: 126/63   Pulse: 65   Resp: 17   Temp: 97.8 F (36.6 C)   SpO2: 97% 97%    Intake/Output Summary (Last 24 hours) at 10/27/2022 1605 Last data filed at 10/27/2022 1000 Gross per 24 hour  Intake 774.67 ml  Output --  Net 774.67 ml   Last Weight  Most recent update: 10/25/2022  2:57 PM    Weight  83 kg (182 lb 15.7 oz)            Gen: Elderly Caucasian F in NAD HEENT: moist mucous membranes CV: Regular rate and rhythm  PULM: Breathing on RA, even and nonlabored ABD: soft/nontender EXT: No edema Neuro: Alert and oriented x3  PPS: 40%   This conversation/these recommendations were discussed with patient primary care team, Dr. XuErlinda HongTotal Time: 92 minutes  Billing based on MDM: High  Problems Addressed: One acute or chronic illness or injury that poses a threat to life  or bodily function  Amount and/or Complexity of Data: Category 3:Discussion of management or test interpretation with external physician/other qualified health care professional/appropriate source (not separately reported)  Risks: Parenteral controlled substances, Decision regarding hospitalization or escalation of hospital care, and Decision not to resuscitate or to de-escalate care because of poor prognosis ______________________________________________________ [facility] Team Cell Phone: [phone] utilize secure chat with additional questions, if there is no response within 30 minutes please call the above phone number  Palliative Medicine Team providers are available by phone from 7am to 7pm daily and can be reached through the team cell phone.  Should this patient require assistance outside of these hours, please call the patient's attending physician.

## 2022-10-27 NOTE — Discharge Instructions (Signed)

## 2022-10-27 NOTE — Evaluation (Signed)
Clinical/Bedside Swallow Evaluation Patient Details  Name: [facility] MRN: [id number] Date of Birth: Sep 27, 1935  Today's Date: 10/27/2022 Time: SLP Start Time (ACUTE ONLY): 1330 SLP Stop Time (ACUTE ONLY): 1340 SLP Time Calculation (min) (ACUTE ONLY): 10 min  Past Medical History:  Past Medical History:  Diagnosis Date   Asthma    Blood transfusion    Chronic diastolic CHF (congestive heart failure) (HCC)    CKD (chronic kidney disease), stage III (HCC)    Coronary artery calcification seen on CT scan    Diabetes mellitus    Erythropoietin deficiency anemia 09/19/2021   Goiter    Hyperlipidemia    Hypertension    Hypothyroidism    Iron deficiency anemia due to chronic blood loss 09/19/2021   Liver cirrhosis (HCC)    NASH (nonalcoholic steatohepatitis)    Obesity    Osteopenia    Pulmonary hypertension (HCC)    Renal cell carcinoma    a. prior nephrectomy.   Right heart failure (HCC)    Thrombocytopenia (HCC)    Past Surgical History:  Past Surgical History:  Procedure Laterality Date   ABDOMINAL HYSTERECTOMY     APPENDECTOMY     HEMORRHOID SURGERY     INTRAMEDULLARY (IM) NAIL INTERTROCHANTERIC Left 12/25/2018   Procedure: INTRAMEDULLARY (IM) NAIL INTERTROCHANTRIC;  Surgeon: Nicholes Stairs, MD;  Location: [facility];  Service: Orthopedics;  Laterality: Left;   KNEE SURGERY     NEPHRECTOMY  09.2000   HPI:  H/o hypothyroidism ,HTN, IDDM2, chronic diastolic CHF, pulmonary hypertension,  Nash, liver cirrhosis,,CKD stage III, renal cell carcinoma with left  nephrectomy in 2000,  presented with intractable back pain , found to have Innumerable bilateral pulmonary metastases.,   L4 fracture concerning for pathological fracture,  Posterior left acetabular wall fracture of indeterminate age , uti    Assessment / Plan / Recommendation  Clinical Impression  Pt demonstrates no signs of dysphagia. She reports she has always had to take one sip at a time and take a slow rate.  She does not feel that there are any acute changes in her swallowing. Under observation pt is able to tolerate thin liquids well with her methods. SLP did encourage pt to be aware of upright position given her reluctance to move due to pain. Pt to sit upright when eating, drinking and taking pills. No SLP f/u needed will sign off. SLP Visit Diagnosis: Dysphagia, unspecified (R13.10)    Aspiration Risk  Mild aspiration risk    Diet Recommendation Thin liquid;Regular   Liquid Administration via: Cup;Straw Medication Administration: Whole meds with liquid Supervision: Patient able to self feed    Other  Recommendations      Recommendations for follow up therapy are one component of a multi-disciplinary discharge planning process, led by the attending physician.  Recommendations may be updated based on patient status, additional functional criteria and insurance authorization.  Follow up Recommendations        Assistance Recommended at Discharge    Functional Status Assessment    Frequency and Duration            Prognosis        Swallow Study   General HPI: H/o hypothyroidism ,HTN, IDDM2, chronic diastolic CHF, pulmonary hypertension,  Nash, liver cirrhosis,,CKD stage III, renal cell carcinoma with left  nephrectomy in 2000,  presented with intractable back pain , found to have Innumerable bilateral pulmonary metastases.,   L4 fracture concerning for pathological fracture,  Posterior left acetabular wall fracture of indeterminate  age , uti Type of Study: Bedside Swallow Evaluation Diet Prior to this Study: Regular;Thin liquids Temperature Spikes Noted: No Respiratory Status: Room air History of Recent Intubation: No Behavior/Cognition: Alert;Cooperative;Pleasant mood Oral Cavity Assessment: Within Functional Limits Oral Care Completed by SLP: No Oral Cavity - Dentition: Adequate natural dentition Self-Feeding Abilities: Able to feed self Patient Positioning: Upright in  bed Baseline Vocal Quality: Normal Volitional Cough: Strong Volitional Swallow: Able to elicit    Oral/Motor/Sensory Function Overall Oral Motor/Sensory Function: Within functional limits   Ice Chips     Thin Liquid Thin Liquid: Within functional limits    Nectar Thick Nectar Thick Liquid: Not tested   Honey Thick Honey Thick Liquid: Not tested   Puree Puree: Within functional limits   Solid     Solid: Within functional limits     Herbie Baltimore, MA CCC-SLP  Acute Rehabilitation Services Secure Chat Preferred Office [phone]  Lynann Beaver 10/27/2022,2:03 PM

## 2022-10-27 NOTE — Progress Notes (Signed)
So far, there really has been no change in overall status.  She has a brace that she can wear for her back when she gets out of bed.  The MRI of her brain did not show any CNS metastasis.  [facility] feels that she would be a candidate for the vertebroplasty.  However, this has been approved by insurance.  I am unsure how long this is going to take.  We really, really need to get a biopsy done.  I would think that she could have a biopsy of one of the lung nodules.  I will see if radiology can do this for [place].  She did get the Zometa yesterday.  Her BUN is 43 creatinine 1.68.  Calcium 8.5.  She does not have too much of an appetite.  I totally understand this.  She has had no fever.  There is been no obvious bleeding.  The alpha-fetoprotein level still not back yet.  We still do not know what we are dealing with from a malignancy point of view.  I would have to believe this is going to be kidney cancer just  by the pattern of metastasis.  However, given that she has NASH, she is at risk for hepatocellular carcinoma.  Again, we need to get a biopsy.  I have no clear lungs, take for insurance to approve the OsteoCool procedure.  We will continue to follow along.  Hopefully, she will be able to get out of bed little bit more.    As always, we had a very good prayer this morning.  Her faith remains incredibly strong.  [name], MD  Oswaldo Milian 7:14

## 2022-10-28 ENCOUNTER — Inpatient Hospital Stay (HOSPITAL_COMMUNITY): Payer: Medicare Other

## 2022-10-28 DIAGNOSIS — I270 Primary pulmonary hypertension: Secondary | ICD-10-CM

## 2022-10-28 DIAGNOSIS — R52 Pain, unspecified: Secondary | ICD-10-CM | POA: Diagnosis not present

## 2022-10-28 DIAGNOSIS — N1832 Chronic kidney disease, stage 3b: Secondary | ICD-10-CM | POA: Diagnosis not present

## 2022-10-28 DIAGNOSIS — E119 Type 2 diabetes mellitus without complications: Secondary | ICD-10-CM | POA: Diagnosis not present

## 2022-10-28 DIAGNOSIS — S32040A Wedge compression fracture of fourth lumbar vertebra, initial encounter for closed fracture: Secondary | ICD-10-CM | POA: Diagnosis not present

## 2022-10-28 DIAGNOSIS — K7460 Unspecified cirrhosis of liver: Secondary | ICD-10-CM

## 2022-10-28 LAB — CBC WITH DIFFERENTIAL/PLATELET
Abs Immature Granulocytes: 0.02 10*3/uL (ref 0.00–0.07)
Basophils Absolute: 0 10*3/uL (ref 0.0–0.1)
Basophils Relative: 1 %
Eosinophils Absolute: 0.3 10*3/uL (ref 0.0–0.5)
Eosinophils Relative: 5 %
HCT: 36.7 % (ref 36.0–46.0)
Hemoglobin: 11.4 g/dL — ABNORMAL LOW (ref 12.0–15.0)
Immature Granulocytes: 0 %
Lymphocytes Relative: 16 %
Lymphs Abs: 0.8 10*3/uL (ref 0.7–4.0)
MCH: 28.9 pg (ref 26.0–34.0)
MCHC: 31.1 g/dL (ref 30.0–36.0)
MCV: 93.1 fL (ref 80.0–100.0)
Monocytes Absolute: 0.5 10*3/uL (ref 0.1–1.0)
Monocytes Relative: 11 %
Neutro Abs: 3.2 10*3/uL (ref 1.7–7.7)
Neutrophils Relative %: 67 %
Platelets: 85 10*3/uL — ABNORMAL LOW (ref 150–400)
RBC: 3.94 MIL/uL (ref 3.87–5.11)
RDW: 15.1 % (ref 11.5–15.5)
WBC: 4.8 10*3/uL (ref 4.0–10.5)
nRBC: 0 % (ref 0.0–0.2)

## 2022-10-28 LAB — GLUCOSE, CAPILLARY
Glucose-Capillary: 106 mg/dL — ABNORMAL HIGH (ref 70–99)
Glucose-Capillary: 160 mg/dL — ABNORMAL HIGH (ref 70–99)
Glucose-Capillary: 143 mg/dL — ABNORMAL HIGH (ref 70–99)
Glucose-Capillary: 132 mg/dL — ABNORMAL HIGH (ref 70–99)
Glucose-Capillary: 153 mg/dL — ABNORMAL HIGH (ref 70–99)
Glucose-Capillary: 148 mg/dL — ABNORMAL HIGH (ref 70–99)

## 2022-10-28 LAB — BASIC METABOLIC PANEL
Anion gap: 10 (ref 5–15)
BUN: 44 mg/dL — ABNORMAL HIGH (ref 8–23)
CO2: 24 mmol/L (ref 22–32)
Calcium: 8.1 mg/dL — ABNORMAL LOW (ref 8.9–10.3)
Chloride: 107 mmol/L (ref 98–111)
Creatinine, Ser: 1.71 mg/dL — ABNORMAL HIGH (ref 0.44–1.00)
GFR, Estimated: 29 mL/min — ABNORMAL LOW (ref >60)
Glucose, Bld: 119 mg/dL — ABNORMAL HIGH (ref 70–99)
Potassium: 3.8 mmol/L (ref 3.5–5.1)
Sodium: 141 mmol/L (ref 135–145)

## 2022-10-28 MED ORDER — KETOROLAC TROMETHAMINE 15 MG/ML IJ SOLN
15.0000 mg | Freq: Two times a day (BID) | INTRAMUSCULAR | Status: AC
  Administered 2022-10-28: 15 mg via INTRAVENOUS
  Filled 2022-10-28: qty 1

## 2022-10-28 MED ORDER — BISACODYL 10 MG RE SUPP
10.0000 mg | Freq: Every day | RECTAL | Status: DC | PRN
  Administered 2022-11-05 – 2022-11-22 (×2): 10 mg via RECTAL
  Filled 2022-10-28 (×3): qty 1

## 2022-10-28 NOTE — Progress Notes (Signed)
PROGRESS NOTE    [facility]  WKG:[id number] DOB: July 09, 1935 DOA: 10/15/2022 PCP: Ann Held, DO     Brief Narrative:  H/o hypothyroidism ,HTN, IDDM2, chronic diastolic CHF, pulmonary hypertension,  Nash, liver cirrhosis,,CKD stage III, renal cell carcinoma with left  nephrectomy in 2000,  presented with intractable back pain , found to have Innumerable bilateral pulmonary metastases.,   L4 fracture concerning for pathological fracture,  Posterior left acetabular wall fracture of indeterminate age , uti   Subjective:  She is sitting up in chair, reports pain is better controlled, states she is going to have liver ultrasound today  No bm, she declined suppository , agreed to stool softener    Assessment & Plan:  Principal Problem:   Intractable pain Active Problems:   Hyperlipidemia   Gout, unspecified   Essential hypertension   Asthma   Anxiety and depression   Platelets decreased (HCC)   Diabetes mellitus type 2, insulin dependent ([place])   History of nephrectomy   Liver cirrhosis secondary to NASH (HCC)   Thrombocytopenia (HCC)   Hypothyroidism   Chronic hypoxemic respiratory failure (HCC)   Chronic diastolic CHF (congestive heart failure) (HCC)   Pulmonary hypertension (HCC)   Chronic renal disease, stage 4, severely decreased glomerular filtration rate (GFR) between 15-29 mL/min/1.73 square meter (HCC)   OSA (obstructive sleep apnea)   Restrictive lung disease   Pathological fracture   Intractable back pain with compression fracture of L4 with concern for pathological fracture and metastasis -MRI, CT of the pelvis showed compression fracture of L4, ?  Pathological with 30% vertebral height loss -Interventional radiology consulted for vertebroplasty/ablation and bone biopsy -hold aspirin -Pain control with bowel regimen      History of renal cell CA with left nephrectomy -Innumerable bilateral pulmonary metastases and  Hepatic cirrhosis with areas of  hypoattenuation in both hepatic lobes concerning for metastases.on staging CT scan  -,  need biopsy to assess if renal CA versus another primary cancer with metastasis Current plan bone biopsy and liver mass biopsy    Posterior left acetabular wall fracture of indeterminate age -CT of the left hip showed small fracture of posterior acetabular wall of indeterminate age -Seen by [facility] on 11/22  who recommend PWB with assistance of a walker, Follow in office as outpt 3 weeks for repeat xrays   Klebsiella UTI -Patient was recently diagnosed with UTI, urine culture showing Klebsiella.   - she has only taken 2 days of oral antibiotics, Augmentin, then 3days of iv rocephin in the hospital, last dose on 11/24. -UA + UTI,  urine cultures ordered on admission, collected on 11/24, f/u on culture result ,     CKD stage IV, history of renal cell carcinoma with left  nephrectomy -Creatinine 1.94 on admission, improving to 1.7.  Creatinine 2.4 on 10/13/2022    DM type II, IDDM, on long-term insulin with CKD stage IV -Resumed carb modified diet, sensitive sliding scale insulin, Lantus 10 units daily HbA1c 6.4 CBG (last 3)  Recent Labs    10/27/22 1235 10/27/22 1610 10/27/22 2242  GLUCAP 158* 122* 164*      COPD /Chronic hypoxic respiratory failure on home o2 3.5liters Stable, no acute issues  Thrombocytopenia, chronic Does has ecchymosis ,but no overt bleeding Management per hematology oncology Dr. Marin Olp   The patient's BMI is: Body mass index is 32.41 kg/m..meet obesity criteria   . FTT, with new metastatic cancer diagnosis, family desires to talk to palliative care, palliative  care consulted , input appreciated       I have Reviewed nursing notes, Vitals, pain scores, I/o's, Lab results and  imaging results since pt's last encounter, details please see discussion above  I ordered the following labs:  Unresulted Labs (From admission, onward)     Start     Ordered    10/28/22 0208  Urine Culture  (Urine Culture)  Once,   R       Question:  Indication  Answer:  Urgency/frequency   10/28/22 0208   10/27/22 8144  Basic metabolic panel  Daily at 5am,   R      10/26/22 1825   10/26/22 0714  AFP tumor marker  Once,   AD        10/26/22 0714   10/25/22 0703  Urine Culture  (Urine Culture)  ONCE - URGENT,   URGENT       Question:  Indication  Answer:  Urgency/frequency   10/25/22 0702   10/25/22 8185  Urine Culture  (Urine Culture)  Add-on,   AD       Question:  Indication  Answer:  Urgency/frequency   10/25/22 0607             DVT prophylaxis: heparin injection 5,000 Units Start: 10/25/22 1400 SCDs Start: 10/25/22 0220   Code Status:   Code Status: Full Code  Family Communication: daughter [name]  at bedside , daughter [name] over the phone per Dr Elsworth Soho request Disposition:   Dispo: The patient is from: lives with her daughter , walk with a walker at baseline              Anticipated d/c is to: TBD, will need PT eval after kyphoplasty/ablation , possibly need SNF,              Anticipated d/c date is: TBD, needs IR intervention, needs biopsy, oncology will contact rad onc for XRT for her back  Antimicrobials:    Anti-infectives (From admission, onward)    Start     Dose/Rate Route Frequency Ordered Stop   10/25/22 0715  cefTRIAXone (ROCEPHIN) 1 g in sodium chloride 0.9 % 100 mL IVPB  Status:  Discontinued        1 g 200 mL/hr over 30 Minutes Intravenous Every 24 hours 10/25/22 0701 10/27/22 1515          Objective: Vitals:   10/27/22 1714 10/27/22 2057 10/28/22 0558 10/28/22 0559  BP: (!) 123/45 (!) 143/52 (!) 127/52 (!) 127/52  Pulse: 61 68  71  Resp: _0 Temp:  98.2 F (36.8 C) 97.6 F (36.4 C) 97.6 F (36.4 C)  TempSrc:  Oral Oral Oral  SpO2: 98% 99%  100%  Weight:      Height:        Intake/Output Summary (Last 24 hours) at 10/28/2022 0647 Last data filed at 10/28/2022 0600 Gross per 24 hour  Intake  410 ml  Output 50 ml  Net 360 ml   Filed Weights   10/13/2022 1457  Weight: 83 kg    Examination:  General exam: frail elderly, alert, awake, communicative,calm, NAD Respiratory system: Clear to auscultation. Respiratory effort normal. Cardiovascular system:  RRR.  Gastrointestinal system: Abdomen is nondistended, soft and nontender.  Normal bowel sounds heard. Central nervous system: Alert and oriented. No focal neurological deficits. Extremities:  no edema Skin: diffuse echymosis , reports chronic,  Psychiatry: Judgement and insight appear normal. Mood & affect appropriate.  Data Reviewed: I have personally reviewed  labs and visualized  imaging studies since the last encounter and formulate the plan        Scheduled Meds:  acetaminophen  1,000 mg Oral Q6H   bisacodyl  10 mg Rectal Daily   carvedilol  12.5 mg Oral BID WC   ezetimibe  10 mg Oral Daily   And   simvastatin  20 mg Oral q1800   feeding supplement  237 mL Oral TID BM   fentaNYL  1 patch Transdermal Q72H   gabapentin  100 mg Oral QHS   heparin  5,000 Units Subcutaneous Q8H   hydrALAZINE  100 mg Oral Q8H   insulin aspart  0-5 Units Subcutaneous QHS   insulin aspart  0-9 Units Subcutaneous TID WC   insulin glargine-yfgn  10 Units Subcutaneous QHS   ketorolac  15 mg Intravenous BID   levothyroxine  25 mcg Oral Q MTWThF   lidocaine  1 patch Transdermal QHS   multivitamin with minerals  1 tablet Oral Daily   ondansetron (ZOFRAN) IV  4 mg Intravenous TID AC & HS   oxybutynin  5 mg Oral Daily   polyethylene glycol  17 g Oral BID   senna-docusate  1 tablet Oral BID   Continuous Infusions:     LOS: 3 days     Florencia Reasons, MD PhD FACP [facility]  Available via Epic secure chat 7am-7pm for nonurgent issues Please page for urgent issues To page the attending provider between 7A-7P or the covering provider during after hours 7P-7A, please log into the web site [email or web address] and access using  universal [place] password for that web site. If you do not have the password, please call the hospital operator.    10/28/2022, 6:47 AM

## 2022-10-28 NOTE — Progress Notes (Signed)
Palliative Medicine Inpatient Follow Up Note   HPI:87 year old female with history of hypothyroidism, hypertension, IDDM2, chronic diastolic CHF, pulmonary hypertension, NASH, liver cirrhosis, CKD stage III, renal cell carcinoma with left nephrectomy in 2000. Admitted for intractable back pain with 4 possible pathologic fractures, multiple pulmonary masses and liver involvement. Palliative Consult for GOC and pain management. Initial yesterday. Seen today for symptom evaluation and continued conversation on [facility]yer.    Today's Discussion 10/28/2022  Chart reviewed inclusive of vital signs, progress notes, laboratory results, and diagnostic images. I received report from Margaret Dyer, her nurse. I assessed Margaret Dyer. I also talked by phone to her daughter Margaret Dyer, that she lives with.   Created space and opportunity for patient and her daughter to explore thoughts feelings and fears regarding current medical situation.  Margaret Dyer states she is feeling more short of breath and wonders if this is related to the tumors in her lungs. She has been pretty sedentary in bed. I reviewed her oxygen saturations which are good. We discussed the use of an incentive spirometer to increase aeration of distal parts of her lungs.  She has used on in the past and is agreeable. She states her pain is better today. She slept well last night and feels the SCDs has helped the right leg pain.   I spoke with Margaret Dyer  about the morning assessment. Provided opportunity for expression of thoughts and feelings. Questions and concerns addressed. Margaret Dyer feels her mother is not comprehending her medical condition and tells different things to her and to her sister Margaret Dyer.  She expressed concern over previous procedure and sedation where Narcan had to be used to revive her mother. She is concerned about pain management associated with kyphoplasty with biopsy and lung biopsy. She does not want her mother to suffer. She also expressed  concern about care mother would need once discharged. She states her mother has had a hospital bed at her home for 4 years and had not been very function. She has a stand  up recliner but spend much of her day in bed or sitting. she cannot pull on or lift her mother as she has had previous bilateral mastectomies. She states, "I battle with selfishness. I want her to come home."  Margaret Dyer has had rehab in the past after hospitalization at [facility] in [place] with in close to the daughter's home. We discussed that no decisions have to be made now. A request is being made for insurance approval for the surgery. Results of biopsies can help with further decision making and formal treatment plan with Dr. Marin Dyer and the palliative medicine team.   Discussed the importance of continued conversation with family and their medical providers regarding overall plan of care and treatment options, ensuring decisions are within the context of the patients values and GOC.   Palliative Support Provided.   Objective Assessment: Vital Signs Vitals:   10/28/22 0559 10/28/22 0814  BP: (!) 127/52 (!) 124/46  Pulse: 71 67  Resp: 16 18  Temp: 97.6 F (36.4 C) 98 F (36.7 C)  SpO2: 100% 96%    Intake/Output Summary (Last 24 hours) at 10/28/2022 0941 Last data filed at 10/28/2022 0600 Gross per 24 hour  Intake 410 ml  Output 50 ml  Net 360 ml   Last Weight  Most recent update: 10/12/2022  2:57 PM    Weight  83 kg (182 lb 15.7 oz)  Gen:  NAD HEENT: moist mucous membranes CV: Regular rate and rhythm, no murmurs rubs or gallops PULM: decreased breath sounds in bases bilaterally. ABD: soft/nontender/nondistended/normal bowel sounds EXT: No edema Neuro: Alert and oriented x3 PPS: 40%  SUMMARY OF RECOMMENDATIONS    Code status:  FULL Code  Symptom management:  Severe lower back pain due to pathologic fracture: - Fentanyl patch at 25 mcg -Dilaudid 1 mg IV push every 3 hours as  needed for moderate pain (last given yesterday at 2:48 pm) -Lidoderm patch at bedtime -Tylenol 1 gm every 6 hours -Gabapentin 100 mg nightly -Toradol 15 mg IVP q 12 hr for 48 hours total - Can consider low dose steroids which will also support nausea in the near future - utilize brace while up  Nausea: Zofran before meals and at bedtime  Insomnia:  -Gabapentin -consider melatonin if needed  Shortness of breath: (oxygen saturations are high on room air) -Incentive spirometry with TCDB every 2 hours while awake. -Oxygen by [place] to keep sats greater than 92%  FTT in Adult:  Dietary consultation  Anxiety:  -Ativan 0.5 mg q day prn  Generalized weakness: Will need rehab recommendations after kyphoplasty  Advance Care Planning:  Appreciate chaplain supporting completion of documents  Palliative prophylaxis:  -aspiration, bowel regimen, delirium protocol, frequent pain assessment, oral care, palliative wound care, turn and position   Additional recommendations: Continue current care  Psychosocial/Spiritual: Continue chaplain support- [facility] faith Additional recommendations: Continued discussion of metastatic renal cell carcinoma short term and long term outlook following biopsies.   Discharge planning: uncertain, family desires [facility] if rehab needed  Palliative team will continue to support for symptom management.   Time Spent: 50 minutes  Billing based on MDM: High Problems Addressed: One acute or chronic illness or injury that poses a threat to life or bodily function   Amount and/or Complexity of Data: Category 3:Discussion of management or test interpretation with external physician/other qualified health care professional/appropriate source (not separately reported)   Risks: Parenteral controlled substances, Decision regarding hospitalization or escalation of hospital care, and Decision not to resuscitate or to de-escalate care because of poor  prognosis. ______________________________________________________________________________________  Lindell Spar, NP [facility] Team Team Cell Phone: [phone] Please utilize secure chat with additional questions, if there is no response within 30 minutes please call the above phone number  Palliative Medicine Team providers are available by phone from 7am to 7pm daily and can be reached through the team cell phone.  Should this patient require assistance outside of these hours, please call the patient's attending physician.

## 2022-10-28 NOTE — Progress Notes (Signed)
An unused Fentanyl patch was seen on the computer table on pt's computer. Pharmacy was called and instructed RN to return the medication tru the Pyxis.

## 2022-10-29 DIAGNOSIS — Z7189 Other specified counseling: Secondary | ICD-10-CM | POA: Diagnosis not present

## 2022-10-29 DIAGNOSIS — Z905 Acquired absence of kidney: Secondary | ICD-10-CM | POA: Diagnosis not present

## 2022-10-29 DIAGNOSIS — R52 Pain, unspecified: Secondary | ICD-10-CM | POA: Diagnosis not present

## 2022-10-29 DIAGNOSIS — S32040A Wedge compression fracture of fourth lumbar vertebra, initial encounter for closed fracture: Secondary | ICD-10-CM | POA: Diagnosis not present

## 2022-10-29 LAB — URINE CULTURE: Culture: NO GROWTH

## 2022-10-29 LAB — BASIC METABOLIC PANEL
Anion gap: 9 (ref 5–15)
BUN: 51 mg/dL — ABNORMAL HIGH (ref 8–23)
CO2: 25 mmol/L (ref 22–32)
Calcium: 7.8 mg/dL — ABNORMAL LOW (ref 8.9–10.3)
Chloride: 108 mmol/L (ref 98–111)
Creatinine, Ser: 2.18 mg/dL — ABNORMAL HIGH (ref 0.44–1.00)
GFR, Estimated: 22 mL/min — ABNORMAL LOW (ref >60)
Glucose, Bld: 180 mg/dL — ABNORMAL HIGH (ref 70–99)
Potassium: 4 mmol/L (ref 3.5–5.1)
Sodium: 142 mmol/L (ref 135–145)

## 2022-10-29 LAB — PROTIME-INR
INR: 1.1 (ref 0.8–1.2)
Prothrombin Time: 14.1 seconds (ref 11.4–15.2)

## 2022-10-29 LAB — GLUCOSE, CAPILLARY
Glucose-Capillary: 174 mg/dL — ABNORMAL HIGH (ref 70–99)
Glucose-Capillary: 200 mg/dL — ABNORMAL HIGH (ref 70–99)
Glucose-Capillary: 126 mg/dL — ABNORMAL HIGH (ref 70–99)
Glucose-Capillary: 69 mg/dL — ABNORMAL LOW (ref 70–99)
Glucose-Capillary: 115 mg/dL — ABNORMAL HIGH (ref 70–99)

## 2022-10-29 MED ORDER — HYDRALAZINE HCL 50 MG PO TABS
50.0000 mg | ORAL_TABLET | Freq: Three times a day (TID) | ORAL | Status: DC
  Administered 2022-10-30 – 2022-11-20 (×50): 50 mg via ORAL
  Filled 2022-10-29 (×61): qty 1

## 2022-10-29 MED ORDER — ONDANSETRON HCL 4 MG PO TABS
4.0000 mg | ORAL_TABLET | Freq: Three times a day (TID) | ORAL | Status: DC | PRN
  Administered 2022-11-11 – 2022-11-20 (×5): 4 mg via ORAL
  Filled 2022-10-29 (×5): qty 1

## 2022-10-29 NOTE — Plan of Care (Signed)

## 2022-10-29 NOTE — Progress Notes (Signed)
PROGRESS NOTE    Margaret Dyer  WVP:[id number] DOB: 1935-04-17 DOA: 10/29/2022 PCP: Margaret Held, DO     Brief Narrative:  H/o hypothyroidism ,HTN, IDDM2, chronic diastolic CHF, pulmonary hypertension,  [place], liver cirrhosis,,CKD stage III, renal cell carcinoma with left  nephrectomy in 2000,  presented with intractable back pain , found to have Innumerable bilateral pulmonary metastases.,   L4 fracture concerning for pathological fracture,  Posterior left acetabular wall fracture of indeterminate age , uti   Subjective:  She was in chair, now back to bed, multiple family member at bedside with permission She reports pain is better controlled,  States she is anxious about the  bone biopsy and liver biopsy, she would like to have prn ativan which she takes as home    Assessment & Plan:  Principal Problem:   Intractable pain Active Problems:   Hyperlipidemia   Gout, unspecified   Essential hypertension   Asthma   Anxiety and depression   Platelets decreased ([place])   Diabetes mellitus type 2, insulin dependent ([place])   History of nephrectomy   Liver cirrhosis secondary to NASH ([place])   Thrombocytopenia (HCC)   Hypothyroidism   Chronic hypoxemic respiratory failure (HCC)   Chronic diastolic CHF (congestive heart failure) ([place])   Pulmonary hypertension (HCC)   Chronic renal disease, stage 4, severely decreased glomerular filtration rate (GFR) between 15-29 mL/min/1.73 square meter (HCC)   OSA (obstructive sleep apnea)   Restrictive lung disease   Pathological fracture   Intractable back pain with compression fracture of L4 with concern for pathological fracture and metastasis -MRI, CT of the pelvis showed compression fracture of L4, ?  Pathological with 30% vertebral height loss -Interventional radiology consulted for vertebroplasty/ablation and bone biopsy -hold aspirin -Pain control with bowel regimen      History of renal cell CA with left  nephrectomy -Innumerable bilateral pulmonary metastases and  Hepatic cirrhosis with areas of hypoattenuation in both hepatic lobes concerning for metastases.on staging CT scan  -,  need biopsy to assess if renal CA versus another primary cancer with metastasis Current plan bone biopsy and liver mass biopsy    Posterior left acetabular wall fracture of indeterminate age -CT of the left hip showed small fracture of posterior acetabular wall of indeterminate age -Seen by [facility] on 11/22  who recommend PWB with assistance of a walker, Follow in office as outpt 3 weeks for repeat xrays   Klebsiella UTI -Patient was recently diagnosed with UTI, urine culture showing Klebsiella.   - she has only taken 2 days of oral antibiotics, Augmentin, then 3days of iv rocephin in the hospital, last dose on 11/24. -UA + UTI,  urine cultures ordered on admission, collected on 11/24, no growth.     CKD stage IV, history of renal cell carcinoma with left  nephrectomy - Creatinine 2.4 on 10/13/2022 -cr went up from 1.7 to 2.18, avoid nephrotoxin, repeat bmp in am     DM type II, IDDM, on long-term insulin with CKD stage IV -Resumed carb modified diet, sensitive sliding scale insulin, Lantus 10 units daily HbA1c 6.4 CBG (last 3)  Recent Labs    10/28/22 2001 10/29/22 0811 10/29/22 1147  GLUCAP 148* 174* 200*      COPD /Chronic hypoxic respiratory failure on home o2 3.5liters Stable, no acute issues  Thrombocytopenia, chronic Does has ecchymosis ,but no overt bleeding Management per hematology oncology Dr. Marin Olp    BMI is: Body mass index is 32.41 kg/m..meet obesity  criteria   . FTT, with new metastatic cancer diagnosis, family desires to talk to palliative care, palliative care consulted , input appreciated       I have Reviewed nursing notes, Vitals, pain scores, I/o's, Lab results and  imaging results since pt's last encounter, details please see discussion above  I ordered  the following labs:  Unresulted Labs (From admission, onward)     Start     Ordered   10/30/22 4098  Basic metabolic panel  Tomorrow morning,   R        10/29/22 1615   10/26/22 0714  AFP tumor marker  Once,   AD        10/26/22 0714      10/25/22 0702      10/25/22 0607             DVT prophylaxis: heparin injection 5,000 Units Start: 10/25/22 1400 SCDs Start: 10/25/22 0220   Code Status:   Code Status: Full Code  Family Communication: family updated at bedside with permission  Disposition:   Dispo: The patient is from: lives with her daughter , walk with a walker at baseline              Anticipated d/c is to: TBD, will need PT eval after kyphoplasty/ablation , possibly need SNF,              Anticipated d/c date is: TBD, needs IR intervention, needs liver biopsy, oncology will contact rad onc for XRT for her back  Antimicrobials:    Anti-infectives (From admission, onward)    Start     Dose/Rate Route Frequency Ordered Stop   10/25/22 0715  cefTRIAXone (ROCEPHIN) 1 g in sodium chloride 0.9 % 100 mL IVPB  Status:  Discontinued        1 g 200 mL/hr over 30 Minutes Intravenous Every 24 hours 10/25/22 0701 10/27/22 1515          Objective: Vitals:   10/29/22 0837 10/29/22 0920 10/29/22 1146 10/29/22 1600  BP: 102/64 (!) 139/52  (!) 129/50  Pulse: 64 71 68   Resp: _0 Temp: 98 F (36.7 C)   97.8 F (36.6 C)  TempSrc:    Oral  SpO2: 100%  99%   Weight:      Height:        Intake/Output Summary (Last 24 hours) at 10/29/2022 1615 Last data filed at 10/29/2022 0816 Gross per 24 hour  Intake 120 ml  Output --  Net 120 ml   Filed Weights   11/01/2022 1457  Weight: 83 kg    Examination:  General exam: frail elderly, alert, awake, communicative,calm, NAD Respiratory system: Clear to auscultation. Respiratory effort normal. Cardiovascular system:  RRR.  Gastrointestinal system: Abdomen is nondistended, soft and nontender.  Normal bowel sounds  heard. Central nervous system: Alert and oriented. No focal neurological deficits. Extremities:  no edema Skin: diffuse echymosis , reports chronic,  Psychiatry: Judgement and insight appear normal. Mood & affect appropriate.     Data Reviewed: I have personally reviewed  labs and visualized  imaging studies since the last encounter and formulate the plan        Scheduled Meds:  acetaminophen  1,000 mg Oral Q6H   carvedilol  12.5 mg Oral BID WC   ezetimibe  10 mg Oral Daily   And   simvastatin  20 mg Oral q1800   feeding supplement  237 mL Oral TID BM   fentaNYL  1 patch Transdermal Q72H   gabapentin  100 mg Oral QHS   heparin  5,000 Units Subcutaneous Q8H   hydrALAZINE  50 mg Oral Q8H   insulin aspart  0-5 Units Subcutaneous QHS   insulin aspart  0-9 Units Subcutaneous TID WC   insulin glargine-yfgn  10 Units Subcutaneous QHS   levothyroxine  25 mcg Oral Q MTWThF   lidocaine  1 patch Transdermal QHS   multivitamin with minerals  1 tablet Oral Daily   oxybutynin  5 mg Oral Daily   Continuous Infusions:     LOS: 4 days     Florencia Reasons, MD PhD FACP [facility]  Available via Epic secure chat 7am-7pm for nonurgent issues Please page for urgent issues To page the attending provider between 7A-7P or the covering provider during after hours 7P-7A, please log into the web site [email or web address] and access using universal [place] password for that web site. If you do not have the password, please call the hospital operator.    10/29/2022, 4:15 PM

## 2022-10-29 NOTE — Progress Notes (Signed)
Brief Nutrition Note  New consult received 11/24 for assessment of nutrition needs. Pt being followed by RD team and interventions in place (see last full assessment note 11/24 for complete evaluation). Pt has frequently been NPO for procedures. IR plans for biopsy of liver mass 11/27 or 11/28 and Lumbar 4 radiofrequency ablation and cement augmentation on 11/29 or 11/30. Will follow-up with pt as planned to determine if additional nutrition interventions are needed.   [name], RD, LDN Clinical Dietitian RD pager # available in [place]  After hours/weekend pager # available in [facility]

## 2022-10-29 NOTE — Progress Notes (Deleted)
  Request seen for Lung biopsy vs Liver lesion biopsy.   This patient also has L4 pathologic fracture.  There is also a request for L4 biopsy/Osteocool/KP.  Insurance authorization is currently underway for L4 biopsy/Osteocool/Kyphoplasty (a bit of delay due to the Thanksgiving Holiday).  Procedure is tentatively planned for Wednesday, November 29th by Dr. Estanislado Pandy (pending insurance authorization and IR schedule).   Kaimana Neuzil S Duffy Dantonio PA-C 10/29/2022 12:09 PM

## 2022-10-29 NOTE — Plan of Care (Signed)
  Problem: Nutritional: Goal: Maintenance of adequate nutrition will improve Outcome: Progressing   Problem: Clinical Measurements: Goal: Respiratory complications will improve Outcome: Progressing   Problem: Activity: Goal: Risk for activity intolerance will decrease Outcome: Progressing   Problem: Coping: Goal: Level of anxiety will decrease Outcome: Progressing   Problem: Pain Managment: Goal: General experience of comfort will improve Outcome: Progressing

## 2022-10-29 NOTE — Progress Notes (Signed)
Palliative: Mrs. Stanke is lying quietly in bed.  She appears acutely ill, elderly.  She is alert and oriented x3, able to make her needs known.  Her brother Ronalee Belts and his wife are present at bedside.  We talked about her acute health conditions including, but not limited to, kyphoplasty, mobility and short-term rehab, further testing.  Mrs. Peery shares that her main concerns at this time are around her kyphoplasty.  She shares that she does not want any "surgery".  We briefly talk about kyphoplasty, and she shares that she will ask more questions of the provider who will perform the procedure.  She tells me that she has asked for breathing treatment and is awaiting respiratory therapy.  She shares that her brother has brought her a treat from [facility] and she would really like to enjoy this, but she is waiting on blood sugar checks.  We talk about enjoying life to the fullest while we can.  CODE STATUS not discussed today as brother is present.  PMT to follow-up tomorrow for CODE STATUS discussions.  Conference with attending, bedside nursing staff, transition of care team related to patient condition, needs, goals of care, disposition.  Plan: Awaiting kyphoplasty and biopsies.  Time for outcomes.  Considering short-term rehab when able with the ultimate goal of returning home.  46 minutes  Quinn Axe, NP Palliative medicine team Team phone [phone] Greater than 50% of this time was spent counseling and coordinating care related to the above assessment and plan.

## 2022-10-29 NOTE — Plan of Care (Signed)
  Problem: Health Behavior/Discharge Planning: Goal: Ability to manage health-related needs will improve Outcome: Progressing   Problem: Nutritional: Goal: Maintenance of adequate nutrition will improve Outcome: Progressing   Problem: Skin Integrity: Goal: Risk for impaired skin integrity will decrease Outcome: Progressing   Problem: Activity: Goal: Risk for activity intolerance will decrease Outcome: Progressing

## 2022-10-29 NOTE — Consult Note (Addendum)
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 intramuscular hematoma or fluid collection. Electronically Signed   By: Keane Police D.O.   On: 11/01/2022 21:05   CT Lumbar Spine Wo Contrast  Result Date: 10/14/2022 CLINICAL DATA:  Low back pain, trauma. EXAM: CT LUMBAR SPINE WITHOUT CONTRAST TECHNIQUE: Multidetector CT imaging of the lumbar spine was performed without intravenous contrast administration. Multiplanar CT image reconstructions were also generated. RADIATION DOSE REDUCTION: This exam was performed according to the departmental dose-optimization program which includes automated exposure control, adjustment of the mA and/or kV according to patient size and/or use of iterative reconstruction technique. COMPARISON:  Radiographs dated October 13, 2022 FINDINGS: Segmentation: 5 lumbar type vertebrae. Alignment: Grade 1 anterolisthesis of L4, chronic process. Vertebrae: Compression fracture  of the L4 vertebral body. There is a proximally 20% vertebral body height loss. No significant retropulsion of the vertebral body into the spinal canal. Paraspinal and other soft tissues: Soft tissue swelling and hematoma about the vertebral body. Postsurgical changes for prior left nephrectomy. Advanced atherosclerotic disease of abdominal aorta and bilateral iliac vessels. Disc levels: Advanced multilevel degenerate disc disease with associated facet joint arthropathy. T12-L1: No significant spinal canal or neural foraminal stenosis. L1-L2: Disc osteophyte complex with mild bilateral lateral recess stenosis. Mild facet joint arthropathy. No significant neural foraminal stenosis. L2-L3: Disc osteophyte complex with bilateral lateral recess stenosis. Facet joint arthropathy. No significant neural foraminal stenosis. L3-L4: Mild retropulsion of the superior endplate of the L4 vertebral body into the spinal canal. Moderate bilateral lateral recess stenosis. Mild bilateral neural foraminal stenosis. L4-L5: Disc osteophyte complex with bilateral lateral recess stenosis. Mild to  moderate bilateral neural foraminal stenosis, left worse than the right. Moderate bilateral facet joint arthropathy. L5-S1: Disc osteophyte complex with moderate lateral recess stenosis. Moderate-to-severe bilateral facet joint arthropathy. IMPRESSION: 1. Acute compression fracture of the L4 vertebral body with approximately 20% vertebral body height loss. Mild retropulsion of the superior endplate into the spinal canal. Further evaluation with MR examination is suggested. 2. Advanced multilevel degenerate disc disease and facet joint arthropathy as described above. 3. Aortic atherosclerosis. Aortic Atherosclerosis (ICD10-I70.0). Electronically Signed   By: Keane Police D.O.   On: 10/31/2022 20:56   CT PELVIS WO CONTRAST  Result Date: 10/08/2022 CLINICAL DATA:  Hip trauma, fracture suspected.  Low back pain. EXAM: CT PELVIS WITHOUT CONTRAST  TECHNIQUE: Multidetector CT imaging of the pelvis was performed following the standard protocol without intravenous contrast. RADIATION DOSE REDUCTION: This exam was performed according to the departmental dose-optimization program which includes automated exposure control, adjustment of the mA and/or kV according to patient size and/or use of iterative reconstruction technique. COMPARISON:  10/11/2022. FINDINGS: Urinary Tract:  No abnormality visualized. Bowel:  No acute abnormality. Vascular/Lymphatic: Atherosclerotic calcification of the distal abdominal aorta and its tributaries. No abdominal or pelvic lymphadenopathy. Reproductive:  Hysterectomy changes are noted. Other: A small amount of free fluid is present in the pelvis. Small fat containing umbilical hernia. Musculoskeletal: No acute fracture or dislocation at the left hip. Fixation hardware is present in the proximal left femur without evidence of hardware loosening. A compression fracture is noted in the superior endplate of L4, partially visualized on exam. Degenerative changes are noted in the lower lumbar spine. IMPRESSION: 1. No acute fracture or dislocation at the left hip. 2. Compression deformity in the superior endplate of L4, partially visualized on exam. Please refer to CT lumbar spine for additional information. 3. Aortic atherosclerosis. Electronically Signed   By: Brett Fairy M.D.   On: 10/16/2022 20:44   DG Hip Unilat W or Wo Pelvis 2-3 Views Left  Result Date: 10/08/2022 CLINICAL DATA:  Left hip pain. Weakness. Fall 1 month ago. EXAM: DG HIP (WITH OR WITHOUT PELVIS) 2-3V LEFT COMPARISON:  None Available. FINDINGS: Intramedullary nail with trans trochanteric and distal locking screw fixation of remote proximal femur fracture. There is no periprosthetic fracture or lucency. Femoral head is well seated, no dislocation. Intact pubic rami. Pubic symphysis and sacroiliac joints are congruent. The bones are under mineralized. There are  vascular calcifications IMPRESSION: 1. No acute fracture or dislocation of the left hip. 2. Hardware in place without complication. Electronically Signed   By: Keith Rake M.D.   On: 11/01/2022 16:14   DG Lumbar Spine Complete  Result Date: 10/13/2022 CLINICAL DATA:  Fall in bathtub with lower back pain EXAM: [place] 4+ VIEW COMPARISON:  Radiographs 09/27/2009 FINDINGS: Demineralization. Grade 1 anterolisthesis of L4 is new since 2010 but is presumed chronic. Vertebral body height is maintained. Mild multilevel spondylosis. Mild disc space height loss at L3-L4 and L4-L5. Moderate lower lumbar facet arthropathy. Calcified splenic artery and aorta.  Surgical clips in the abdomen. IMPRESSION: Demineralization.  No definite acute fracture. Grade 1 anterolisthesis of L4 on L5. Electronically Signed   By: Placido Sou M.D.   On: 10/13/2022 00:57    Labs:  CBC: Recent Labs    10/07/2022 1520 10/25/22 0235 10/26/22 0225 10/28/22 0311  WBC 5.8 6.9 5.4 4.8  HGB 13.3 12.7 11.5* 11.4*  HCT 40.8 40.6 38.1 36.7  PLT 107* 93* 78* 85*    COAGS:  Recent Labs    10/25/22 0620  INR 1.3*    BMP: Recent Labs    10/26/22 0225 10/27/22 0301 10/28/22 0311 10/29/22 0331  NA 141 142 141 142  K 3.9 3.8 3.8 4.0  CL 102 104 107 108  CO2 _0 GLUCOSE 192* 138* 119* 180*  BUN 46* 43* 44* 51*  CALCIUM 8.5* 8.5* 8.1* 7.8*  CREATININE 1.71* 1.68* 1.71* 2.18*  GFRNONAA 29* 30* 29* 22*    LIVER FUNCTION TESTS: Recent Labs    03/14/22 1304 10/13/22 0049 10/31/2022 1520 10/26/22 0225  BILITOT 0.6 0.9 1.2 0.8  AST 23 30 36 30  ALT _1 ALKPHOS 42 78 92 66  PROT 6.9 6.4* 6.8 5.6*  ALBUMIN 3.7 3.2* 3.5 2.6*    TUMOR MARKERS: No results for input(s): "AFPTM", "CEA", "CA199", "CHROMGRNA" in the last 8760 hours.  Assessment and Plan:  Presumed metastatic renal cell carcinoma with L4 lesion,  lung and liver lesions.  Images reviewed by Dr.Henn and Dr. Karenann Cai.  Will proceed with image guided liver biopsy tomorrow or Tuesday (as IR schedule allows).  Will proceed with Lumbar 4 radiofrequency ablation and cement augmentation Wednesday or Thursday pending insurance authorization and as IR schedule allows.  Risks and benefits of liver lesion biopsy was discussed with the patient and/or patient's family including, but not limited to bleeding, infection, damage to adjacent structures or low yield requiring additional tests.  Risks and benefits of L4 radiofrequency ablation and cement augmentation/kyphoplasty were discussed with the patient including, but not limited to education regarding the natural healing process of compression fractures without intervention, bleeding, infection, cement migration which may cause spinal cord damage, paralysis, pulmonary embolism or even death.  This interventional procedure involves the use of X-rays and because of the nature of the planned procedure, it is possible that we will have prolonged use of X-ray fluoroscopy.  Potential radiation risks to you include (but are not limited to) the following: - A slightly elevated risk for cancer  several years later in life. This risk is typically less than 0.5% percent. This risk is low in comparison to the normal incidence of human cancer, which is 33% for women and 50% for men according to the [employer]. - Radiation induced injury can include skin redness, resembling a rash, tissue breakdown / ulcers and hair loss (which can be temporary or permanent).   The likelihood of either of these occurring depends on the difficulty of the procedure and whether you are sensitive to radiation due to previous procedures, disease, or genetic conditions.   IF your procedure requires a prolonged use of radiation, you will be notified and given written instructions for further action.  It is your responsibility to monitor the irradiated area for the 2 weeks  following the procedure and to notify your physician if you are concerned that you have suffered a radiation induced injury.    All of the patient's questions were answered, patient is agreeable to proceed.  Both Consents signed and in IR.   Thank you for allowing our service to participate in ADELIN VENTRELLA 's care.  Electronically Signed: Murrell Redden, PA-C   10/29/2022, 12:27 PM      I spent a total of 40 Minutes  in face to face in clinical consultation, greater than 50% of which was counseling/coordinating care for Liver lesion bx and L4 osteocool/KP

## 2022-10-30 ENCOUNTER — Inpatient Hospital Stay (HOSPITAL_COMMUNITY): Payer: Medicare Other

## 2022-10-30 DIAGNOSIS — R52 Pain, unspecified: Secondary | ICD-10-CM | POA: Diagnosis not present

## 2022-10-30 DIAGNOSIS — M8000XD Age-related osteoporosis with current pathological fracture, unspecified site, subsequent encounter for fracture with routine healing: Secondary | ICD-10-CM

## 2022-10-30 DIAGNOSIS — Z515 Encounter for palliative care: Secondary | ICD-10-CM | POA: Diagnosis not present

## 2022-10-30 DIAGNOSIS — Z7189 Other specified counseling: Secondary | ICD-10-CM | POA: Diagnosis not present

## 2022-10-30 LAB — BASIC METABOLIC PANEL
Anion gap: 7 (ref 5–15)
BUN: 49 mg/dL — ABNORMAL HIGH (ref 8–23)
CO2: 26 mmol/L (ref 22–32)
Calcium: 7.2 mg/dL — ABNORMAL LOW (ref 8.9–10.3)
Chloride: 107 mmol/L (ref 98–111)
Creatinine, Ser: 1.87 mg/dL — ABNORMAL HIGH (ref 0.44–1.00)
GFR, Estimated: 26 mL/min — ABNORMAL LOW (ref >60)
Glucose, Bld: 128 mg/dL — ABNORMAL HIGH (ref 70–99)
Potassium: 4.2 mmol/L (ref 3.5–5.1)
Sodium: 140 mmol/L (ref 135–145)

## 2022-10-30 LAB — GLUCOSE, CAPILLARY
Glucose-Capillary: 116 mg/dL — ABNORMAL HIGH (ref 70–99)
Glucose-Capillary: 147 mg/dL — ABNORMAL HIGH (ref 70–99)
Glucose-Capillary: 186 mg/dL — ABNORMAL HIGH (ref 70–99)
Glucose-Capillary: 191 mg/dL — ABNORMAL HIGH (ref 70–99)
Glucose-Capillary: 153 mg/dL — ABNORMAL HIGH (ref 70–99)

## 2022-10-30 MED ORDER — FENTANYL CITRATE (PF) 100 MCG/2ML IJ SOLN
INTRAMUSCULAR | Status: AC
  Filled 2022-10-30: qty 2

## 2022-10-30 MED ORDER — MIDAZOLAM HCL 2 MG/2ML IJ SOLN
INTRAMUSCULAR | Status: AC
  Filled 2022-10-30: qty 2

## 2022-10-30 MED ORDER — MIDAZOLAM HCL 2 MG/2ML IJ SOLN
INTRAMUSCULAR | Status: AC | PRN
  Administered 2022-10-30: 1 mg via INTRAVENOUS
  Administered 2022-10-30: .5 mg via INTRAVENOUS

## 2022-10-30 MED ORDER — INSULIN GLARGINE-YFGN 100 UNIT/ML ~~LOC~~ SOLN
5.0000 [IU] | Freq: Every day | SUBCUTANEOUS | Status: DC
  Administered 2022-10-30 – 2022-11-07 (×9): 5 [IU] via SUBCUTANEOUS
  Filled 2022-10-30 (×11): qty 0.05

## 2022-10-30 MED ORDER — LIDOCAINE HCL (PF) 1 % IJ SOLN
INTRAMUSCULAR | Status: AC
  Filled 2022-10-30: qty 30

## 2022-10-30 MED ORDER — LIDOCAINE HCL 1 % IJ SOLN
INTRAMUSCULAR | Status: AC | PRN
  Administered 2022-10-30: 10 mL

## 2022-10-30 MED ORDER — GELATIN ABSORBABLE 12-7 MM EX MISC
CUTANEOUS | Status: AC
  Filled 2022-10-30: qty 1

## 2022-10-30 MED ORDER — FENTANYL CITRATE (PF) 100 MCG/2ML IJ SOLN
INTRAMUSCULAR | Status: AC | PRN
  Administered 2022-10-30 (×2): 25 ug via INTRAVENOUS

## 2022-10-30 MED ORDER — GELATIN ABSORBABLE 12-7 MM EX MISC
CUTANEOUS | Status: AC | PRN
  Administered 2022-10-30: 1 via TOPICAL

## 2022-10-30 MED ORDER — LIDOCAINE HCL (PF) 1 % IJ SOLN
15.0000 mL | Freq: Once | INTRAMUSCULAR | Status: AC
  Administered 2022-10-30: 15 mL via INTRADERMAL

## 2022-10-30 MED FILL — Nutritional Supplement Liquid: ORAL | Qty: 237 | Status: AC

## 2022-10-30 NOTE — Progress Notes (Signed)
Margaret Dyer is having some breathing issues.  She is getting a nebulizer.  She has a lot of anxiety about the biopsy.  It sounds like a biopsy will be done today of the liver.  Not sure when the biopsy will be done for the bone and when the kyphoplasty will be done for that.  I tried to reassure her that the radiologists are so good to doing these procedures.  They will keep her as comfortable as possible.  We still do not have the alpha-fetoprotein level back yet.  Labs today show BUN 49 creatinine 1.87.  Calcium is 7.2.  On Saturday, her white count was 4.8.  Hemoglobin 11.4.  Platelet count 85,000.  She has had no bleeding.  There is been no bruising.  She really does not get out of bed.  She really needs to have the back brace.  The kyphoplasty will help more than anything for her back.  She will also need radiation therapy for the back.  At this point in time, there is really not much that I can do.  We will have to see what her labs look like.  This is all about tissue diagnosis.  This is about having Interventional Radiology do the kyphoplasty at L4 and then Radiation Oncology doing radiation for her.  She is worried about going home too early.  I told her that I would not think that she will be going home anytime soon given her pulmonary issues.  It is possible that she may have to go to rehab.  Her vital signs are temperature 98.5.  Pulse 94.  Blood pressure 118/45.  Her lungs sound clear bilaterally.  She has good air movement bilaterally.  I do not hear any wheezing.  Cardiac exam is regular rate and rhythm.  Abdomen is soft.  Bowel sounds are present.  Extremity shows no clubbing, cyanosis or edema.  At this point time, we really need to get a tissue diagnosis to know what is going on.  Hopefully, she will have the liver biopsy today.  I think we are awaiting insurance approval for the kyphoplasty L4.  I know that she is getting incredible care from all the staff upon [address].  I do  appreciate their compassion.  [name], MD  Oswaldo Milian 7:14

## 2022-10-30 NOTE — Progress Notes (Signed)
Informed by scheduling team that this patient's L4 Osteocool and KP does not required pre-authorization.   Will plan for L4 Osteocool and KP with Dr. Estanislado Pandy on Thursday AM.   PLAN - NPO Thursday PM  - sq heparin hold for Wed PM and Thursday   Please call IR for questions and concerns.   Armando Gang Elverda Wendel PA-C 10/30/2022 9:28 AM

## 2022-10-30 NOTE — Progress Notes (Signed)
PROGRESS NOTE  Margaret Dyer  DOB: 31-Jan-1935  PCP: Ann Held, DO UQJ:[id number]  DOA: 10/13/2022  LOS: 5 days  Hospital Day: 7  Brief narrative: Margaret Dyer is a 87 y.o. female with PMH significant for DM2, HTN, HLD, chronic diastolic CHF, pulmonary hypertension, liver cirrhosis, CKD 3, renal cell carcinoma with left nephrectomy 2000, hypothyroidism. 11/21, patient presented to the ED with complaint of intractable back pain after a fall CT scan showed acute compression fracture of L4 vertebral body.  It also showed innumerable bilateral pulmonary metastases, posterior left acetabular wall fracture of indeterminate age. MRI confirmed the findings. Admitted to hospitalist service Consultation was obtained from radiology, oncology  Subjective: Patient was seen and examined this afternoon.  Pleasant elderly Caucasian female.  Lying on bed.  Not in distress.  Underwent liver biopsy today.  Not in pain currently.  On low-flow oxygen.  Assessment and plan: Intractable back pain with compression fracture of L4 with concern for pathological fracture and metastasis MRI, CT of the pelvis showed compression fracture of L4, pathological with 30% vertebral height loss Interventional radiology consulted for vertebroplasty/ablation and bone biopsy. Aspirin on hold.   Noted plan from IR for kyphoplasty. Continue pain management.  Innumerable bilateral pulmonary metastasis History of renal cell CA with left nephrectomy CT scan obtained on admission showed innumerable bilateral pulmonary metastases as well as possible liver mets and bone mets.  Oncology consult appreciated.  Underwent liver biopsy today.  Pending report.  Posterior left acetabular wall fracture of indeterminate age CT of the left hip showed small fracture of posterior acetabular wall of indeterminate age 21/22, seen by [facility], recommended PWB with assistance of a walker, Follow in office as outpt 3 weeks  for repeat xrays   Klebsiella UTI Patient was recently diagnosed with UTI, urine culture showing Klebsiella.   She has only taken 2 days of oral antibiotics, Augmentin, then 3 days of iv rocephin in the hospital, last dose on 11/24. UA + UTI,  urine cultures ordered on admission, collected on 11/24, no growth.     CKD stage IV history of renal cell carcinoma with left  nephrectomy Baseline creatinine close to 1.8.  Continue to monitor. Recent Labs    12/24/21 1143 03/14/22 1304 10/13/22 0049 10/22/2022 1520 10/25/22 0235 10/25/22 0620 10/26/22 0225 10/27/22 0301 10/28/22 0311 10/29/22 0331 10/30/22 0346  BUN 42* 39* 72* 57*  --  54* 46* 43* 44* 51* 49*  CREATININE 1.45* 1.46* 2.44* 1.94* 1.82* 1.73* 1.71* 1.68* 1.71* 2.18* 1.87*   Type 2 diabetes mellitus with hypoglycemia A1c 6.4 on 10/25/2022 PTA on Lantus 12 units nightly. Currently on Lantus 10 units nightly.  Was hypoglycemic to 69 last night and hence bedtime dose of Lantus was held.  Will resume Lantus nightly at a lower dose of 5 units.   Diabetes care coordinator consult appreciated. Recent Labs  Lab 10/29/22 1927 10/29/22 2207 10/30/22 0304 10/30/22 0751 10/30/22 1130  GLUCAP 69* 115* 116* 147* 186*   COPD /Chronic hypoxic respiratory failure  on home O2 3.5liters Stable, no acute issues  Essential hypertension Blood pressure currently stable. PTA on carvedilol 12.5 mg twice daily, hydralazine 100 mg 3 times daily Continue both.  HLD Zetia, statin  Hypothyroidism On Synthroid 25 mcg daily on weekdays. Resume the same.   Thrombocytopenia, chronic Does has ecchymosis ,but no overt bleeding Management per hematology oncology Dr. Marin Olp Recent Labs  Lab 10/05/2022 1520 10/25/22 0235 10/26/22 0225 10/28/22 0311  PLT 107*  93* 78* 85*    Failure to thrive Secondary to cancer.  Palliative consult appreciated.   Code Status: Full Code    Mobility: Encourage ambulation.  Will obtain PT eval after  kyphoplasty.  Infusions:    Scheduled Meds:  acetaminophen  1,000 mg Oral Q6H   carvedilol  12.5 mg Oral BID WC   ezetimibe  10 mg Oral Daily   And   simvastatin  20 mg Oral q1800   feeding supplement  237 mL Oral TID BM   fentaNYL  1 patch Transdermal Q72H   gabapentin  100 mg Oral QHS   heparin  5,000 Units Subcutaneous Q8H   hydrALAZINE  50 mg Oral Q8H   insulin aspart  0-5 Units Subcutaneous QHS   insulin aspart  0-9 Units Subcutaneous TID WC   insulin glargine-yfgn  5 Units Subcutaneous QHS   levothyroxine  25 mcg Oral Q MTWThF   lidocaine  1 patch Transdermal QHS   multivitamin with minerals  1 tablet Oral Daily   oxybutynin  5 mg Oral Daily    PRN meds: bisacodyl, HYDROmorphone (DILAUDID) injection, ipratropium-albuterol, LORazepam, ondansetron, senna-docusate   Skin assessment:     Nutritional status:  Body mass index is 32.41 kg/m.  Nutrition Problem: Increased nutrient needs Etiology: acute illness, chronic illness Signs/Symptoms: estimated needs     Diet:  Diet Order             Diet NPO time specified Except for: Sips with Meds  Diet effective midnight           Diet Carb Modified Fluid consistency: Thin; Room service appropriate? Yes  Diet effective now                   DVT prophylaxis:  heparin injection 5,000 Units Start: 10/25/22 1400 SCDs Start: 10/25/22 0220   Antimicrobials: Completed the course of IV Rocephin Fluid: None Consultants: Oncology, IR Family Communication: Family at bedside  Status is: Inpatient  Continue inhospital care because: Pending kyphoplasty Level of care: Med-Surg   Dispo: The patient is from: Home              Anticipated d/c is to: Pending clinical course              Patient currently is not medically stable to d/c.   Difficult to place patient No       Antimicrobials: Anti-infectives (From admission, onward)    Start     Dose/Rate Route Frequency Ordered Stop   10/25/22 0715  cefTRIAXone  (ROCEPHIN) 1 g in sodium chloride 0.9 % 100 mL IVPB  Status:  Discontinued        1 g 200 mL/hr over 30 Minutes Intravenous Every 24 hours 10/25/22 0701 10/27/22 1515       Objective: Vitals:   10/30/22 1100 10/30/22 1500  BP: (!) 129/49 (!) 110/42  Pulse: 74 66  Resp:  17  Temp: 98.2 F (36.8 C) 97.9 F (36.6 C)  SpO2:  98%    Intake/Output Summary (Last 24 hours) at 10/30/2022 1621 Last data filed at 10/29/2022 2129 Gross per 24 hour  Intake --  Output 600 ml  Net -600 ml   Filed Weights   10/30/2022 1457  Weight: 83 kg   Weight change:  Body mass index is 32.41 kg/m.   Physical Exam: General exam: Pleasant, elderly Caucasian female, not in pain Skin: No rashes, lesions or ulcers. HEENT: Atraumatic, normocephalic, no obvious bleeding Lungs: Clear to auscultate  bilaterally CVS: Regular rate and rhythm, no murmur GI/Abd soft, nontender, nondistended, bowel sound present CNS: Alert, awake, oriented x 3 Psychiatry: Sad affect Extremities: No pedal edema, no calf tenderness  Data Review: I have personally reviewed the laboratory data and studies available.  F/u labs ordered Unresulted Labs (From admission, onward)     Start     Ordered   10/31/22 0500  CBC with Differential/Platelet  Daily at 5am,   R      10/30/22 0701   10/31/22 0500  Comprehensive metabolic panel  Daily at 5am,   R      10/30/22 0701   10/26/22 0714  AFP tumor marker  Once,   AD        10/26/22 0714   10/25/22 0703  Urine Culture  (Urine Culture)  ONCE - URGENT,   URGENT       Question:  Indication  Answer:  Urgency/frequency   10/25/22 0702   10/25/22 5208  Urine Culture  (Urine Culture)  Add-on,   AD       Question:  Indication  Answer:  Urgency/frequency   10/25/22 0607            Signed, Terrilee Croak, MD [facility] 10/30/2022

## 2022-10-30 NOTE — Progress Notes (Signed)
Inpatient Diabetes Program Recommendations  AACE/ADA: New Consensus Statement on Inpatient Glycemic Control (2015)  Target Ranges:  Prepandial:   less than 140 mg/dL      Peak postprandial:   less than 180 mg/dL (1-2 hours)      Critically ill patients:  140 - 180 mg/dL   Lab Results  Component Value Date   GLUCAP 186 (H) 10/30/2022   HGBA1C 6.4 (H) 10/25/2022    Review of Glycemic Control  Latest Reference Range & Units 10/29/22 19:27 10/29/22 22:07 10/30/22 03:04 10/30/22 07:51 10/30/22 11:30  Glucose-Capillary 70 - 99 mg/dL 69 (L) 115 (H) 116 (H) 147 (H) 186 (H)   Diabetes history: DM 2 Outpatient Diabetes medications:  Semglee 12 units daily, Apidra 1-2 units tid with meals Current orders for Inpatient glycemic control:  Novolog 0-9 units tid with meals and HS Semglee 10 units q HS Inpatient Diabetes Program Recommendations:    Note that Semglee held last PM.  Consider reducing Semglee to 5 units q HS.   Thanks,  Adah Perl, RN, BC-ADM Inpatient Diabetes Coordinator Pager [phone]  (8a-5p)

## 2022-10-30 NOTE — Progress Notes (Signed)
Palliative: Margaret Dyer is lying quietly in bed.  She appears acutely/chronically ill and frail, obese.  She is alert and oriented, able to make her needs known.  Her daughters Margaret Dyer and Margaret Dyer are present at bedside.  Talked about her meeting with Dr. Marin Olp this morning.  Overall, she is quite knowledgeable about what is happening.  She shares that she understands that it is likely she has a return of cancer and that her time is limited.  I reviewed Dr. Antonieta Pert note with patient and family.  We talk about liver biopsy, several days for results.  We talked about kyphoplasty with bone sampling, tentative for Thursday.  We talk about time for results.  We talk about x-ray therapy, and I share that it remains to be seen what will be recommended based on biopsy results.  I share that kyphoplasty should offer quick relief for her pain, and that x-ray treatment can also reduce pain with the effects lasting for weeks.  Reassurance provided to patient and family.  We talk in detail about CODE STATUS, "treat the treatable, but allowing natural passing".  Margaret Dyer shares her difficulty in making these choices.  We talk about what we can and cannot change.  I encouraged her to consider what this time looks like and feels like.  Daughter Margaret Dyer asks about defibrillation only, sharing that her 42 year old son has had defibrillation several times.  We talked about the realities of CPR, how attempted resuscitation works in tandem with chest compressions, intubation, ACLS medications along with defibrillation if needed.  We talk about preferred place of death.  Margaret Dyer shares that she would be comfortable if Margaret Dyer wanted to die in their home.  Margaret Dyer states that she has lived with her daughter Margaret Dyer for 4 or 5 years.  She had been independent with ADLs, and some light meal prep, but feels that she is getting weaker.  She shares her difficulty in bathing at home.  We talk about short-term rehab versus home with  home health.  I shared that she will be evaluated for rehab at a future time.  We talked about palliative medicine saying that they decide what they do and do not want, where they seek service.    We talked about the benefits of hospice care for in-home "treat the treatable" care.  I shared that it is better to take hospice early, build trusting relationships.  Conference with attending, bedside nursing staff, transitional care team related to patient condition, needs, goals of care, disposition.    Plan: At this point continue to treat the treatable.  Considering CODE STATUS.  Anticipating kyphoplasty with bone biopsy 12/30.  Anticipating x-ray therapy.  Will need PT eval when stable.  Considering short-term rehab versus home with home health.  Hospice discussions started.  PMT to follow-up 12/1 after kyphoplasty.  65 minutes, extended time Quinn Axe, NP Palliative medicine team Team phone [phone] Greater than 50% of this time was spent counseling and coordinating care related to the above assessment and plan.

## 2022-10-30 NOTE — Procedures (Signed)
Vascular and Interventional Radiology Procedure Note  Patient: Margaret Dyer DOB: [birth year] Medical Record Number: [id number] Note Date/Time: 10/30/22 10:01 AM   Performing Physician: Michaelle Birks, MD Assistant(s): None  Diagnosis: Liver mass. No Dx  Procedure: LIVER MASS BIOPSY  Anesthesia: Conscious Sedation Complications: None Estimated Blood Loss: Minimal Specimens: Sent for Pathology  Findings:  Successful Ultrasound-guided biopsy of liver mass. A total of 3 samples were obtained. Hemostasis of the tract was achieved using Gelfoam Slurry Embolization.  *Extremely challenging liver lesion Bx, secondary to liver echogenicity. If sample inadequate, then consider CT-guided Bx for additional tissue.  Plan: Bed rest for 2 hours.  See detailed procedure note with images in PACS. The patient tolerated the procedure well without incident or complication and was returned to Floor Bed in stable condition.    Michaelle Birks, MD [facility]   Pager. [place]

## 2022-10-30 NOTE — Progress Notes (Signed)
This chaplain responded to PMT NP-Michelle's consult for creating/updating the Pt. Advance Directive.   The Pt. is awake and pleasantly agrees to the chaplain's presence and continued AD education. The chaplain understands the Pt. choice is to have her daughters: [name] and [name] share the role of HCPOA.  The Pt. has not made a decision on preparing a Living Will. The chaplain understands the Pt. prefers to read over the document and talk to her daughters again before notarizing the AD.  The chaplain listens as the Pt. describes her faith and willingness to place her care in God's hands. The Pt. shares she has a community praying for her and finds comfort in God's love. The Pt. accepted the chaplain's invitation for prayer and F/U spiritual care.   Chaplain Sallyanne Kuster [phone]

## 2022-10-30 NOTE — Plan of Care (Signed)
  Problem: Fluid Volume: Goal: Ability to maintain a balanced intake and output will improve Outcome: Progressing   Problem: Education: Goal: Knowledge of General Education information will improve Description: Including pain rating scale, medication(s)/side effects and non-pharmacologic comfort measures Outcome: Progressing   Problem: Nutrition: Goal: Adequate nutrition will be maintained Outcome: Progressing   Problem: Pain Managment: Goal: General experience of comfort will improve Outcome: Progressing

## 2022-10-31 DIAGNOSIS — Z515 Encounter for palliative care: Secondary | ICD-10-CM | POA: Diagnosis not present

## 2022-10-31 DIAGNOSIS — Z7189 Other specified counseling: Secondary | ICD-10-CM | POA: Diagnosis not present

## 2022-10-31 DIAGNOSIS — R52 Pain, unspecified: Secondary | ICD-10-CM | POA: Diagnosis not present

## 2022-10-31 LAB — CBC WITH DIFFERENTIAL/PLATELET
Abs Immature Granulocytes: 0.04 10*3/uL (ref 0.00–0.07)
Basophils Absolute: 0 10*3/uL (ref 0.0–0.1)
Basophils Relative: 1 %
Eosinophils Absolute: 0.2 10*3/uL (ref 0.0–0.5)
Eosinophils Relative: 4 %
HCT: 35.1 % — ABNORMAL LOW (ref 36.0–46.0)
Hemoglobin: 10.3 g/dL — ABNORMAL LOW (ref 12.0–15.0)
Immature Granulocytes: 1 %
Lymphocytes Relative: 23 %
Lymphs Abs: 0.9 10*3/uL (ref 0.7–4.0)
MCH: 28.5 pg (ref 26.0–34.0)
MCHC: 29.3 g/dL — ABNORMAL LOW (ref 30.0–36.0)
MCV: 97 fL (ref 80.0–100.0)
Monocytes Absolute: 0.6 10*3/uL (ref 0.1–1.0)
Monocytes Relative: 15 %
Neutro Abs: 2.3 10*3/uL (ref 1.7–7.7)
Neutrophils Relative %: 56 %
Platelets: 84 10*3/uL — ABNORMAL LOW (ref 150–400)
RBC: 3.62 MIL/uL — ABNORMAL LOW (ref 3.87–5.11)
RDW: 15.9 % — ABNORMAL HIGH (ref 11.5–15.5)
WBC: 4 10*3/uL (ref 4.0–10.5)
nRBC: 0 % (ref 0.0–0.2)

## 2022-10-31 LAB — COMPREHENSIVE METABOLIC PANEL
ALT: 13 U/L (ref 0–44)
AST: 41 U/L (ref 15–41)
Albumin: 2.4 g/dL — ABNORMAL LOW (ref 3.5–5.0)
Alkaline Phosphatase: 71 U/L (ref 38–126)
Anion gap: 8 (ref 5–15)
BUN: 48 mg/dL — ABNORMAL HIGH (ref 8–23)
CO2: 24 mmol/L (ref 22–32)
Calcium: 6.7 mg/dL — ABNORMAL LOW (ref 8.9–10.3)
Chloride: 107 mmol/L (ref 98–111)
Creatinine, Ser: 1.83 mg/dL — ABNORMAL HIGH (ref 0.44–1.00)
GFR, Estimated: 27 mL/min — ABNORMAL LOW (ref >60)
Glucose, Bld: 123 mg/dL — ABNORMAL HIGH (ref 70–99)
Potassium: 4.6 mmol/L (ref 3.5–5.1)
Sodium: 139 mmol/L (ref 135–145)
Total Bilirubin: 0.6 mg/dL (ref 0.3–1.2)
Total Protein: 5 g/dL — ABNORMAL LOW (ref 6.5–8.1)

## 2022-10-31 LAB — GLUCOSE, CAPILLARY
Glucose-Capillary: 122 mg/dL — ABNORMAL HIGH (ref 70–99)
Glucose-Capillary: 132 mg/dL — ABNORMAL HIGH (ref 70–99)
Glucose-Capillary: 154 mg/dL — ABNORMAL HIGH (ref 70–99)

## 2022-10-31 MED ORDER — HYDROMORPHONE HCL 2 MG PO TABS
2.0000 mg | ORAL_TABLET | ORAL | Status: DC | PRN
  Administered 2022-10-31 – 2022-11-09 (×9): 2 mg via ORAL
  Filled 2022-10-31 (×9): qty 1

## 2022-10-31 MED ORDER — CALCIUM GLUCONATE-NACL 1-0.675 GM/50ML-% IV SOLN
1.0000 g | Freq: Once | INTRAVENOUS | Status: AC
  Administered 2022-10-31: 1000 mg via INTRAVENOUS
  Filled 2022-10-31: qty 50

## 2022-10-31 MED ORDER — HYDROMORPHONE HCL 1 MG/ML IJ SOLN
1.0000 mg | INTRAMUSCULAR | Status: DC | PRN
  Administered 2022-11-02: 1 mg via INTRAVENOUS
  Filled 2022-10-31: qty 1

## 2022-10-31 NOTE — Progress Notes (Signed)
PROGRESS NOTE  Margaret Dyer  DOB: Feb 22, 1935  PCP: Ann Held, DO VUD:[id number]  DOA: 10/15/2022  LOS: 6 days  Hospital Day: 8  Brief narrative: Margaret Dyer is a 86 y.o. female with PMH significant for DM2, HTN, HLD, chronic diastolic CHF, pulmonary hypertension, liver cirrhosis, CKD 3, renal cell carcinoma with left nephrectomy 2000, hypothyroidism. 11/21, patient presented to the ED with complaint of intractable back pain after a fall CT scan showed acute compression fracture of L4 vertebral body.  It also showed innumerable bilateral pulmonary metastases, posterior left acetabular wall fracture of indeterminate age. MRI confirmed the findings. Admitted to hospitalist service Consultation was obtained from radiology, oncology  Subjective: Patient was seen and examined this afternoon.   Lying on bed.  Not in distress.  No family at bedside.  Patient is comfortable.  Per RN, she was given pain medicine in last 1 hour.  Assessment and plan: Intractable back pain with compression fracture of L4 with concern for pathological fracture and metastasis MRI, CT of the pelvis showed compression fracture of L4, pathological with 30% vertebral height loss Interventional radiology consulted for vertebroplasty/ablation and bone biopsy. Aspirin on hold.   Noted plan from IR for kyphoplasty next 1 to 2 days Continue pain management.  Innumerable bilateral pulmonary metastasis History of renal cell CA with left nephrectomy CT scan obtained on admission showed innumerable bilateral pulmonary metastases as well as possible liver mets and bone mets.  Oncology consult appreciated.  11/27, underwent liver biopsy .  Pending report.  Posterior left acetabular wall fracture of indeterminate age CT of the left hip showed small fracture of posterior acetabular wall of indeterminate age 57/22, seen by [facility], recommended PWB with assistance of a walker, Follow in office as outpt  3 weeks for repeat xrays   Klebsiella UTI Patient was recently diagnosed with UTI, urine culture showing Klebsiella.   She has only taken 2 days of oral antibiotics, Augmentin, then 3 days of iv rocephin in the hospital, last dose on 11/24. UA + UTI,  urine cultures ordered on admission, collected on 11/24, no growth.     CKD stage IV history of renal cell carcinoma with left  nephrectomy Baseline creatinine close to 1.8.  Continue to monitor.  Currently not on IV hydration. Recent Labs    03/14/22 1304 10/13/22 0049 10/27/2022 1520 10/25/22 0235 10/25/22 0620 10/26/22 0225 10/27/22 0301 10/28/22 0311 10/29/22 0331 10/30/22 0346 10/31/22 0419  BUN 39* 72* 57*  --  54* 46* 43* 44* 51* 49* 48*  CREATININE 1.46* 2.44* 1.94* 1.82* 1.73* 1.71* 1.68* 1.71* 2.18* 1.87* 1.83*   Type 2 diabetes mellitus with hypoglycemia A1c 6.4 on 10/25/2022 PTA on Lantus 12 units nightly. Currently blood sugar control as below on Lantus 5 units nightly. Diabetes care coordinator consult appreciated. Recent Labs  Lab 10/30/22 1130 10/30/22 1656 10/30/22 2029 10/31/22 0751 10/31/22 1123  GLUCAP 186* 191* 153* 122* 132*   COPD /Chronic hypoxic respiratory failure  on home O2 3.5liters Stable, no acute issues  Essential hypertension Blood pressure currently stable. PTA on carvedilol 12.5 mg twice daily, hydralazine 100 mg 3 times daily Continue both.  HLD Zetia, statin  Hypothyroidism On Synthroid 25 mcg daily on weekdays. Continue the same.   Thrombocytopenia, chronic Does has ecchymosis ,but no overt bleeding Management per hematology oncology Dr. Marin Olp Recent Labs  Lab 10/23/2022 1520 10/25/22 0235 10/26/22 0225 10/28/22 0311 10/31/22 0419  PLT 107* 93* 78* 85* 84*  Failure to thrive Secondary to cancer.  Palliative consult appreciated.   Code Status: Full Code    Mobility: Encourage ambulation.  Will obtain PT eval after kyphoplasty.  Infusions:    Scheduled  Meds:  acetaminophen  1,000 mg Oral Q6H   carvedilol  12.5 mg Oral BID WC   ezetimibe  10 mg Oral Daily   And   simvastatin  20 mg Oral q1800   feeding supplement  237 mL Oral TID BM   fentaNYL  1 patch Transdermal Q72H   gabapentin  100 mg Oral QHS   heparin  5,000 Units Subcutaneous Q8H   hydrALAZINE  50 mg Oral Q8H   insulin aspart  0-5 Units Subcutaneous QHS   insulin aspart  0-9 Units Subcutaneous TID WC   insulin glargine-yfgn  5 Units Subcutaneous QHS   levothyroxine  25 mcg Oral Q MTWThF   lidocaine  1 patch Transdermal QHS   multivitamin with minerals  1 tablet Oral Daily   oxybutynin  5 mg Oral Daily    PRN meds: bisacodyl, HYDROmorphone (DILAUDID) injection, HYDROmorphone, ipratropium-albuterol, LORazepam, ondansetron, senna-docusate   Skin assessment:     Nutritional status:  Body mass index is 32.41 kg/m.  Nutrition Problem: Increased nutrient needs Etiology: acute illness, chronic illness Signs/Symptoms: estimated needs     Diet:  Diet Order             Diet NPO time specified Except for: Sips with Meds  Diet effective midnight           Diet Carb Modified Fluid consistency: Thin; Room service appropriate? Yes  Diet effective now                   DVT prophylaxis:  heparin injection 5,000 Units Start: 10/25/22 1400 SCDs Start: 10/25/22 0220   Antimicrobials: Completed the course of IV Rocephin Fluid: None Consultants: Oncology, IR Family Communication: Family not at bedside today  Status is: Inpatient  Continue inhospital care because: Pending kyphoplasty Level of care: Med-Surg   Dispo: The patient is from: Home              Anticipated d/c is to: Pending clinical course              Patient currently is not medically stable to d/c.   Difficult to place patient No       Antimicrobials: Anti-infectives (From admission, onward)    Start     Dose/Rate Route Frequency Ordered Stop   10/25/22 0715  cefTRIAXone (ROCEPHIN) 1 g in  sodium chloride 0.9 % 100 mL IVPB  Status:  Discontinued        1 g 200 mL/hr over 30 Minutes Intravenous Every 24 hours 10/25/22 0701 10/27/22 1515       Objective: Vitals:   10/31/22 0555 10/31/22 1126  BP: (!) 101/40 (!) 109/42  Pulse: 60 63  Resp: 16 18  Temp: 97.7 F (36.5 C) 98.2 F (36.8 C)  SpO2: 99% 100%    Intake/Output Summary (Last 24 hours) at 10/31/2022 1458 Last data filed at 10/31/2022 1446 Gross per 24 hour  Intake 600 ml  Output 700 ml  Net -100 ml   Filed Weights   10/25/2022 1457  Weight: 83 kg   Weight change:  Body mass index is 32.41 kg/m.   Physical Exam: General exam: Pleasant, elderly Caucasian female, not in pain Skin: No rashes, lesions or ulcers. HEENT: Atraumatic, normocephalic, no obvious bleeding Lungs: Clear to auscultation bilaterally CVS:  Regular rate and rhythm, no murmur GI/Abd soft, nontender, nondistended, bowel sound present CNS: Drowsy from the effect of pain medicine Psychiatry: Sad affect Extremities: No pedal edema, no calf tenderness  Data Review: I have personally reviewed the laboratory data and studies available.  F/u labs ordered Unresulted Labs (From admission, onward)     Start     Ordered   10/31/22 0500  CBC with Differential/Platelet  Daily at 5am,   R      10/30/22 0701   10/31/22 0500  Comprehensive metabolic panel  Daily at 5am,   R      10/30/22 0701   10/26/22 0714  AFP tumor marker  Once,   AD        10/26/22 0714   10/25/22 0703  Urine Culture  (Urine Culture)  ONCE - URGENT,   URGENT       Question:  Indication  Answer:  Urgency/frequency   10/25/22 0702   10/25/22 5637  Urine Culture  (Urine Culture)  Add-on,   AD       Question:  Indication  Answer:  Urgency/frequency   10/25/22 0607            Signed, Terrilee Croak, MD [facility] 10/31/2022

## 2022-10-31 NOTE — Plan of Care (Signed)
  Problem: Nutritional: Goal: Maintenance of adequate nutrition will improve Outcome: Progressing   Problem: Health Behavior/Discharge Planning: Goal: Ability to manage health-related needs will improve Outcome: Progressing   Problem: Clinical Measurements: Goal: Will remain free from infection Outcome: Progressing   Problem: Clinical Measurements: Goal: Respiratory complications will improve Outcome: Progressing

## 2022-10-31 NOTE — Progress Notes (Signed)
[name] is feeling a little better this morning.  She underwent her biopsy yesterday.  She said it went well.  She is now is little anxious over the kyphoplasty for Thursday.  Her labs this morning show white count of 4.  Hemoglobin 10.3.  Platelet count 84,000.  I am still awaiting the results of the alpha-fetoprotein.  We sent this off a week ago.  Her BUN is 48 creatinine 1.83.  Albumin is 2.4.  She has had no problems with fever.  Her appetite still seems to be doing fairly well.  She is having no problems with bowels or bladder.  There is no bleeding.  She has had no cough.  For right now, we still are in a "holding pattern".  We still need to await the biopsy results.  I would not think that we have any biopsy results, if any, for another couple days.  Sound like it was very difficult biopsy to do.  I am sure that when she has this kyphoplasty, biopsies will be done of the vertebral body.  As always, her faith remains incredibly strong.  We will continue to pray for her.  She is relying on her faith to get her through this.  I do appreciate the incredible care that she is getting from all the staff up on [address].   [name], MD  [facility]

## 2022-10-31 NOTE — Progress Notes (Addendum)
Palliative Medicine Inpatient Follow Up Note HPI: 87 year old female with history of hypothyroidism, hypertension, IDDM2, chronic diastolic CHF, pulmonary hypertension, NASH, liver cirrhosis, CKD stage III, renal cell carcinoma with left nephrectomy in 2000. Admitted for intractable back pain with 4 possible pathologic fractures, multiple pulmonary masses and liver involvement. Palliative Consult for GOC and pain management. Initial yesterday. Seen today for symptom evaluation and continued conversation on [place].   Today's Discussion 10/31/2022  Chart reviewed inclusive of vital signs, progress notes, laboratory results, and diagnostic images. I received report from Margaret Dyer, her nurse. I assessed Margaret Dyer who is lying in bed comfortably.  Margaret Dyer shares that he pain has improved to some degree. She has intermittent 6/10 pain. We talked about adding oral dilaudid to support management and utilizing the IV dilaudid for sever/breakthrough pain.  In regards to nausea, sleep, and constipation these are all improved.   Margaret Dyer shares her mood is generally good. She expresses relief that her liver biopsy is completed. She shares apprehensions regarding tomorrow's biopsy which were discussed. Emotional support provided through therapeutic listening.  Margaret Dyer shares that she plans to speak to her daughters today regarding resuscitation status. She is considering DNAR/DNI but wants to get their thoughts as well. We reviewed that a colleague would follow up on Friday.   Discussed the importance of continued conversation with family and their medical providers regarding overall plan of care and treatment options, ensuring decisions are within the context of the patients values and GOC.   Palliative Support Provided.   Objective Assessment: Vital Signs Vitals:   10/30/22 2024 10/31/22 0555  BP: (!) 116/59 (!) 101/40  Pulse: 68 60  Resp: 18 16  Temp: 98.1 F (36.7 C) 97.7 F (36.5 C)  SpO2: 98% 99%     Intake/Output Summary (Last 24 hours) at 10/31/2022 1046 Last data filed at 10/30/2022 2027 Gross per 24 hour  Intake --  Output 700 ml  Net -700 ml    Last Weight  Most recent update: 10/06/2022  2:57 PM    Weight  83 kg (182 lb 15.7 oz)            Gen:  NAD HEENT: moist mucous membranes CV: Regular rate and rhythm, no murmurs rubs or gallops PULM: decreased breath sounds in bases bilaterally. ABD: soft/nontender/nondistended/normal bowel sounds EXT: No edema Neuro: Alert and oriented x3 PPS: 40%  SUMMARY OF RECOMMENDATIONS   Full Code --> patient has the hard choices for loving people booklet which she is reviewing in greater detail. She plans to speak with her daughters today considering DNAR/DNI  Anticipating kyphoplasty with bone biopsy 12/30   Advanced directives provided for review and completion--> our chaplain team will further help with these on Monday  Hospice discussions initiated per prior  Symptom support as below   PMT will continue to [place] during her hospitalization in an effort to maintain appropriate symptom management  Code status:  FULL Code  Symptom management:  Severe lower back pain due to pathologic fracture: -Fentanyl patch at 25 mcg -Dilaudid 1 mg IV push every 3 hours as needed for sever/breakthrough -Dilaudid 72m PO Q4H PRN moderate pain -Lidoderm patch at bedtime -Tylenol 1 gm every 6 hours -Gabapentin 100 mg nightly - Can consider low dose steroids which will also support nausea in the near future - Utilize LSO brace while up  Nausea: -Zofran before meals and at bedtime  Insomnia:  -Gabapentin  Shortness of breath: (oxygen saturations are high on room air) -  Incentive spirometry with TCDB every 2 hours while awake. -Oxygen by [place] to keep sats greater than 92%  FTT in Adult:  -Dietary consultation  Constipation: - LBM 11/27 - Continue senna 1 tab PO Bedtime PRN  Anxiety:  -Ativan 0.5 mg q day  prn  Generalized weakness: -Will need rehab recommendations after kyphoplasty  Advance Care Planning:  -Appreciate chaplain supporting completion of documents  Palliative prophylaxis:  -aspiration, bowel regimen, delirium protocol, frequent pain assessment, oral care, palliative wound care, turn and position  Additional recommendations: Continue current care  Psychosocial/Spiritual: Continue chaplain support- [facility] faith Additional recommendations: Continued discussion of metastatic renal cell carcinoma short term and long term outlook following biopsies.   Discharge planning: uncertain, family desires [facility] if rehab needed  Palliative team will continue to support for symptom management.   Time Spent: 50 minutes  Billing based on MDM: High Problems Addressed: One acute or chronic illness or injury that poses a threat to life or bodily function   Amount and/or Complexity of Data: Category 3:Discussion of management or test interpretation with external physician/other qualified health care professional/appropriate source (not separately reported)   Risks: Parenteral controlled substances, Decision regarding hospitalization or escalation of hospital care, and Decision not to resuscitate or to de-escalate care because of poor prognosis. ______________________________________________________________________________________  Tacey Ruiz, NP [place] Team Team Cell Phone: [phone] Please utilize secure chat with additional questions, if there is no response within 30 minutes please call the above phone number  Palliative Medicine Team providers are available by phone from 7am to 7pm daily and can be reached through the team cell phone.  Should this patient require assistance outside of these hours, please call the patient's attending physician.

## 2022-11-01 ENCOUNTER — Other Ambulatory Visit (HOSPITAL_COMMUNITY): Payer: Self-pay | Admitting: Physician Assistant

## 2022-11-01 ENCOUNTER — Inpatient Hospital Stay (HOSPITAL_COMMUNITY): Payer: Medicare Other

## 2022-11-01 DIAGNOSIS — R52 Pain, unspecified: Secondary | ICD-10-CM | POA: Diagnosis not present

## 2022-11-01 DIAGNOSIS — Z01818 Encounter for other preprocedural examination: Secondary | ICD-10-CM

## 2022-11-01 LAB — GLUCOSE, CAPILLARY
Glucose-Capillary: 102 mg/dL — ABNORMAL HIGH (ref 70–99)
Glucose-Capillary: 150 mg/dL — ABNORMAL HIGH (ref 70–99)
Glucose-Capillary: 163 mg/dL — ABNORMAL HIGH (ref 70–99)
Glucose-Capillary: 127 mg/dL — ABNORMAL HIGH (ref 70–99)
Glucose-Capillary: 184 mg/dL — ABNORMAL HIGH (ref 70–99)
Glucose-Capillary: 189 mg/dL — ABNORMAL HIGH (ref 70–99)

## 2022-11-01 LAB — CBC WITH DIFFERENTIAL/PLATELET
Abs Immature Granulocytes: 0.05 10*3/uL (ref 0.00–0.07)
Basophils Absolute: 0.1 10*3/uL (ref 0.0–0.1)
Basophils Relative: 1 %
Eosinophils Absolute: 0.2 10*3/uL (ref 0.0–0.5)
Eosinophils Relative: 5 %
HCT: 35.5 % — ABNORMAL LOW (ref 36.0–46.0)
Hemoglobin: 10.9 g/dL — ABNORMAL LOW (ref 12.0–15.0)
Immature Granulocytes: 1 %
Lymphocytes Relative: 19 %
Lymphs Abs: 0.9 10*3/uL (ref 0.7–4.0)
MCH: 29.4 pg (ref 26.0–34.0)
MCHC: 30.7 g/dL (ref 30.0–36.0)
MCV: 95.7 fL (ref 80.0–100.0)
Monocytes Absolute: 0.7 10*3/uL (ref 0.1–1.0)
Monocytes Relative: 14 %
Neutro Abs: 2.8 10*3/uL (ref 1.7–7.7)
Neutrophils Relative %: 60 %
Platelets: 83 10*3/uL — ABNORMAL LOW (ref 150–400)
RBC: 3.71 MIL/uL — ABNORMAL LOW (ref 3.87–5.11)
RDW: 15.9 % — ABNORMAL HIGH (ref 11.5–15.5)
WBC: 4.7 10*3/uL (ref 4.0–10.5)
nRBC: 0 % (ref 0.0–0.2)

## 2022-11-01 LAB — COMPREHENSIVE METABOLIC PANEL
ALT: 13 U/L (ref 0–44)
AST: 43 U/L — ABNORMAL HIGH (ref 15–41)
Albumin: 2.5 g/dL — ABNORMAL LOW (ref 3.5–5.0)
Alkaline Phosphatase: 115 U/L (ref 38–126)
Anion gap: 7 (ref 5–15)
BUN: 44 mg/dL — ABNORMAL HIGH (ref 8–23)
CO2: 25 mmol/L (ref 22–32)
Calcium: 6.9 mg/dL — ABNORMAL LOW (ref 8.9–10.3)
Chloride: 107 mmol/L (ref 98–111)
Creatinine, Ser: 2 mg/dL — ABNORMAL HIGH (ref 0.44–1.00)
GFR, Estimated: 24 mL/min — ABNORMAL LOW (ref >60)
Glucose, Bld: 149 mg/dL — ABNORMAL HIGH (ref 70–99)
Potassium: 4.7 mmol/L (ref 3.5–5.1)
Sodium: 139 mmol/L (ref 135–145)
Total Bilirubin: 0.6 mg/dL (ref 0.3–1.2)
Total Protein: 5.3 g/dL — ABNORMAL LOW (ref 6.5–8.1)

## 2022-11-01 MED ORDER — IPRATROPIUM-ALBUTEROL 0.5-2.5 (3) MG/3ML IN SOLN
3.0000 mL | Freq: Four times a day (QID) | RESPIRATORY_TRACT | Status: DC
  Administered 2022-11-01 – 2022-11-03 (×7): 3 mL via RESPIRATORY_TRACT
  Filled 2022-11-01 (×9): qty 3

## 2022-11-01 NOTE — Progress Notes (Signed)
This chaplain attempted a spiritual care visit. The Pt. is on the bedside commode at the time of the visit and prefers her privacy. The chaplain will plan a revisit.  Chaplain Sallyanne Kuster [phone]

## 2022-11-01 NOTE — Care Management Important Message (Signed)
Important Message  Patient Details  Name: Margaret Dyer MRN: [id number] Date of Birth: [birth year]   Medicare Important Message Given:  Yes     Hannah Beat 11/01/2022, 8:03 AM

## 2022-11-01 NOTE — Plan of Care (Signed)

## 2022-11-01 NOTE — Progress Notes (Signed)
PROGRESS NOTE  Margaret Dyer  DOB: 06/07/1935  PCP: Ann Held, DO JIR:[id number]  DOA: 10/21/2022  LOS: 7 days  Hospital Day: 9  Brief narrative: Margaret Dyer is a 87 y.o. female with PMH significant for DM2, HTN, HLD, chronic diastolic CHF, pulmonary hypertension, liver cirrhosis, CKD 3, renal cell carcinoma with left nephrectomy 2000, hypothyroidism. 11/21, patient presented to the ED with complaint of intractable back pain after a fall CT scan showed acute compression fracture of L4 vertebral body.  It also showed innumerable bilateral pulmonary metastases, posterior left acetabular wall fracture of indeterminate age. MRI confirmed the findings. Admitted to hospitalist service Consultation was obtained from radiology, oncology  Subjective: Sitting in chair, daughter asking for nebs to be scheduled  Assessment and plan: Intractable back pain with compression fracture of L4 with concern for pathological fracture and metastasis MRI, CT of the pelvis showed compression fracture of L4, pathological with 30% vertebral height loss Interventional radiology consulted for vertebroplasty/ablation and bone biopsy. Aspirin on hold.   Noted plan from IR for kyphoplasty in AM Continue pain management.  Innumerable bilateral pulmonary metastasis History of renal cell CA with left nephrectomy CT scan obtained on admission showed innumerable bilateral pulmonary metastases as well as possible liver mets and bone mets.  Oncology consult appreciated- suspected metastatic renal cell carcinoma 11/27, underwent liver biopsy .  Pending report. -may need radiation  Posterior left acetabular wall fracture of indeterminate age CT of the left hip showed small fracture of posterior acetabular wall of indeterminate age 56/22, seen by [facility], recommended PWB with assistance of a walker, Follow in office as outpt 3 weeks for repeat xrays   Klebsiella UTI Patient was recently  diagnosed with UTI, urine culture showing Klebsiella.   She has only taken 2 days of oral antibiotics, Augmentin, then 3 days of iv rocephin in the hospital, last dose on 11/24.    CKD stage IV history of renal cell carcinoma with left  nephrectomy Baseline creatinine close to 1.8.  Continue to monitor.    Type 2 diabetes mellitus with hypoglycemia A1c 6.4 on 10/25/2022 PTA on Lantus 12 units nightly. Currently blood sugar control as below on Lantus 5 units nightly.  COPD /Chronic hypoxic respiratory failure  on home O2 3.5liters -schedule nebs  Essential hypertension Blood pressure currently stable. PTA on carvedilol 12.5 mg twice daily, hydralazine 100 mg 3 times daily Continue both.  HLD Zetia, statin  Hypothyroidism On Synthroid 25 mcg daily on weekdays. Continue the same.   Thrombocytopenia, chronic Does has ecchymosis ,but no overt bleeding Management per hematology oncology Dr. Marin Olp  Failure to thrive Secondary to cancer.  Palliative consult appreciated.  Obesity Estimated body mass index is 32.41 kg/m as calculated from the following:   Height as of this encounter: _0  (1.6 m).   Weight as of this encounter: 83 kg.    Code Status: Full Code    Mobility: Encourage ambulation.  Will obtain PT eval after kyphoplasty.  Infusions:    Scheduled Meds:  acetaminophen  1,000 mg Oral Q6H   carvedilol  12.5 mg Oral BID WC   ezetimibe  10 mg Oral Daily   And   simvastatin  20 mg Oral q1800   feeding supplement  237 mL Oral TID BM   fentaNYL  1 patch Transdermal Q72H   gabapentin  100 mg Oral QHS   heparin  5,000 Units Subcutaneous Q8H   hydrALAZINE  50 mg Oral Q8H  insulin aspart  0-5 Units Subcutaneous QHS   insulin aspart  0-9 Units Subcutaneous TID WC   insulin glargine-yfgn  5 Units Subcutaneous QHS   ipratropium-albuterol  3 mL Nebulization Q6H   levothyroxine  25 mcg Oral Q MTWThF   lidocaine  1 patch Transdermal QHS   multivitamin with  minerals  1 tablet Oral Daily   oxybutynin  5 mg Oral Daily    PRN meds: bisacodyl, HYDROmorphone (DILAUDID) injection, HYDROmorphone, LORazepam, ondansetron, senna-docusate     Nutritional status:  Body mass index is 32.41 kg/m.  Nutrition Problem: Increased nutrient needs Etiology: acute illness, chronic illness Signs/Symptoms: estimated needs   DVT prophylaxis:  heparin injection 5,000 Units Start: 10/25/22 1400 SCDs Start: 10/25/22 0220   Consultants: Oncology, IR Family Communication: at bedside  Status is: Inpatient  Continue inhospital care because: Pending kyphoplasty Level of care: Med-Surg   Dispo: The patient is from: Home              Anticipated d/c is to: Pending clinical course              Patient currently is not medically stable to d/c.   Difficult to place patient No      Objective: Vitals:   11/01/22 0754 11/01/22 0919  BP: (!) 113/52   Pulse: 66   Resp: 18   Temp: 97.7 F (36.5 C)   SpO2: 98% 98%    Intake/Output Summary (Last 24 hours) at 11/01/2022 1323 Last data filed at 11/01/2022 1000 Gross per 24 hour  Intake 820 ml  Output 725 ml  Net 95 ml   Filed Weights   10/20/2022 1457  Weight: 83 kg   Weight change:  Body mass index is 32.41 kg/m.   Physical Exam:  General: Appearance:    Obese female in no acute distress     Lungs:      respirations unlabored  Heart:    Normal heart rate.   MS:   All extremities are intact.   Neurologic:   Awake, alert, oriented x 3     Data Review: I have personally reviewed the laboratory data and studies available.   Signed, Geradine Girt, DO [facility] 11/01/2022

## 2022-11-01 NOTE — Progress Notes (Signed)
Margaret Dyer says she feels a little bit short of breath this morning.  I do not know if this is anxiety.  I know she has had this before.  I think she really needs to have an incentive spirometer to use.  We will have to order this.  Her labs look okay.  Her white cell count is 4.7.  Hemoglobin 10.9.  Platelet count 83,000.  The BUN is 44 creatinine 2.0.  She still does not have the pathology back on her liver biopsy.  Maybe, this will be back today.  I still am puzzled as to why it takes a week to get the alpha-fetoprotein level back.  His she says she is eating okay.  She is getting some supplements with Ensure Plus.  She is not having any diarrhea.  She says her pain is doing pretty good.  She is up out of bed and sitting in the chair.  She has had no nausea or vomiting.  She has had no cough.  There is no fever.   Her vital signs show temperature of 98.2.  Pulse 69.  Blood pressure 116/48.  Her lungs sound clear bilaterally.  I do not hear any wheezing.  She has good air movement bilaterally.  Cardiac exam regular rate and rhythm.  She has no murmurs.  Abdomen is soft.  Bowel sounds are present.  She has no fluid wave.  There is no palpable liver or spleen tip.  Extremities shows no clubbing, cyanosis or edema.  Neurological exam shows no focal neurological deficits.  Ms. Bresnan has likely metastatic renal cell carcinoma.  Again, we have no pathology back.  We are still awaiting the liver biopsy.  She is to have the kyphoplasty along with bone biopsy tomorrow.  She will need radiation therapy.  Again I would use an incentive spirometer to help with her breathing.  It may not be a bad idea to get a chest x-ray on her.  We we will continue to await pathology.  I do appreciate the wonderful care that she is getting from everybody on [address].  [name], MD  Exodus 14:14

## 2022-11-02 ENCOUNTER — Inpatient Hospital Stay (HOSPITAL_COMMUNITY): Payer: Medicare Other

## 2022-11-02 ENCOUNTER — Inpatient Hospital Stay (HOSPITAL_COMMUNITY): Payer: Medicare Other | Admitting: Anesthesiology

## 2022-11-02 ENCOUNTER — Other Ambulatory Visit: Payer: Self-pay | Admitting: Radiation Therapy

## 2022-11-02 ENCOUNTER — Encounter (HOSPITAL_COMMUNITY): Payer: Self-pay | Admitting: Family Medicine

## 2022-11-02 DIAGNOSIS — R52 Pain, unspecified: Secondary | ICD-10-CM | POA: Diagnosis not present

## 2022-11-02 DIAGNOSIS — C7951 Secondary malignant neoplasm of bone: Secondary | ICD-10-CM

## 2022-11-02 LAB — COMPREHENSIVE METABOLIC PANEL
ALT: 15 U/L (ref 0–44)
AST: 38 U/L (ref 15–41)
Albumin: 2.5 g/dL — ABNORMAL LOW (ref 3.5–5.0)
Alkaline Phosphatase: 127 U/L — ABNORMAL HIGH (ref 38–126)
Anion gap: 6 (ref 5–15)
BUN: 43 mg/dL — ABNORMAL HIGH (ref 8–23)
CO2: 24 mmol/L (ref 22–32)
Calcium: 7.1 mg/dL — ABNORMAL LOW (ref 8.9–10.3)
Chloride: 112 mmol/L — ABNORMAL HIGH (ref 98–111)
Creatinine, Ser: 1.88 mg/dL — ABNORMAL HIGH (ref 0.44–1.00)
GFR, Estimated: 26 mL/min — ABNORMAL LOW (ref >60)
Glucose, Bld: 153 mg/dL — ABNORMAL HIGH (ref 70–99)
Potassium: 4.9 mmol/L (ref 3.5–5.1)
Sodium: 142 mmol/L (ref 135–145)
Total Bilirubin: 0.3 mg/dL (ref 0.3–1.2)
Total Protein: 5.6 g/dL — ABNORMAL LOW (ref 6.5–8.1)

## 2022-11-02 LAB — GLUCOSE, CAPILLARY
Glucose-Capillary: 131 mg/dL — ABNORMAL HIGH (ref 70–99)
Glucose-Capillary: 153 mg/dL — ABNORMAL HIGH (ref 70–99)
Glucose-Capillary: 161 mg/dL — ABNORMAL HIGH (ref 70–99)
Glucose-Capillary: 166 mg/dL — ABNORMAL HIGH (ref 70–99)

## 2022-11-02 LAB — CBC WITH DIFFERENTIAL/PLATELET
Abs Immature Granulocytes: 0.04 10*3/uL (ref 0.00–0.07)
Basophils Absolute: 0 10*3/uL (ref 0.0–0.1)
Basophils Relative: 1 %
Eosinophils Absolute: 0.3 10*3/uL (ref 0.0–0.5)
Eosinophils Relative: 5 %
HCT: 36 % (ref 36.0–46.0)
Hemoglobin: 10.8 g/dL — ABNORMAL LOW (ref 12.0–15.0)
Immature Granulocytes: 1 %
Lymphocytes Relative: 17 %
Lymphs Abs: 0.8 10*3/uL (ref 0.7–4.0)
MCH: 29 pg (ref 26.0–34.0)
MCHC: 30 g/dL (ref 30.0–36.0)
MCV: 96.5 fL (ref 80.0–100.0)
Monocytes Absolute: 0.7 10*3/uL (ref 0.1–1.0)
Monocytes Relative: 16 %
Neutro Abs: 2.9 10*3/uL (ref 1.7–7.7)
Neutrophils Relative %: 60 %
Platelets: 90 10*3/uL — ABNORMAL LOW (ref 150–400)
RBC: 3.73 MIL/uL — ABNORMAL LOW (ref 3.87–5.11)
RDW: 16.2 % — ABNORMAL HIGH (ref 11.5–15.5)
WBC: 4.7 10*3/uL (ref 4.0–10.5)
nRBC: 0 % (ref 0.0–0.2)

## 2022-11-02 LAB — PROTIME-INR
INR: 1.2 (ref 0.8–1.2)
Prothrombin Time: 14.8 seconds (ref 11.4–15.2)

## 2022-11-02 LAB — SURGICAL PATHOLOGY

## 2022-11-02 MED ORDER — PNEUMOCOCCAL VAC POLYVALENT 25 MCG/0.5ML IJ INJ
0.5000 mL | INJECTION | INTRAMUSCULAR | Status: AC
  Administered 2022-11-03: 0.5 mL via INTRAMUSCULAR
  Filled 2022-11-02: qty 0.5

## 2022-11-02 MED ORDER — BUPIVACAINE HCL (PF) 0.5 % IJ SOLN
INTRAMUSCULAR | Status: AC
  Filled 2022-11-02: qty 30

## 2022-11-02 MED ORDER — LIDOCAINE HCL (PF) 1 % IJ SOLN
INTRAMUSCULAR | Status: AC
  Filled 2022-11-02: qty 30

## 2022-11-02 MED ORDER — VANCOMYCIN HCL IN DEXTROSE 1-5 GM/200ML-% IV SOLN: 1000.0000 mg | INTRAVENOUS | Status: AC

## 2022-11-02 MED ORDER — LIDOCAINE HCL (PF) 1 % IJ SOLN
INTRAMUSCULAR | Status: AC
  Filled 2022-11-02: qty 5

## 2022-11-02 MED ORDER — FUROSEMIDE 10 MG/ML IJ SOLN
40.0000 mg | Freq: Once | INTRAMUSCULAR | Status: AC
  Administered 2022-11-02: 40 mg via INTRAVENOUS
  Filled 2022-11-02: qty 4

## 2022-11-02 MED ORDER — SODIUM CHLORIDE 0.9 % IV SOLN: INTRAVENOUS | Status: DC

## 2022-11-02 NOTE — Progress Notes (Signed)
PROGRESS NOTE  Margaret Dyer  DOB: 02/19/1935  PCP: Ann Held, DO PQD:[id number]  DOA: 10/23/2022  LOS: 8 days  Hospital Day: 10  Brief narrative: Margaret Dyer is a 87 y.o. female with PMH significant for DM2, HTN, HLD, chronic diastolic CHF, pulmonary hypertension, liver cirrhosis, CKD 3, renal cell carcinoma with left nephrectomy 2000, hypothyroidism. 11/21, patient presented to the ED with complaint of intractable back pain after a fall CT scan showed acute compression fracture of L4 vertebral body.  It also showed innumerable bilateral pulmonary metastases, posterior left acetabular wall fracture of indeterminate age. MRI confirmed the findings. Admitted to hospitalist service Consultation was obtained from radiology, oncology -IR procedure postponed due to patient not being able to lay on abdomen.    Subjective: Not able to lay on stomach for procedure  Assessment and plan: Intractable back pain with compression fracture of L4 with concern for pathological fracture and metastasis MRI, CT of the pelvis showed compression fracture of L4, pathological with 30% vertebral height loss Interventional radiology consulted for vertebroplasty/ablation and bone biopsy. Aspirin on hold.   -retry kyphoplasy in the AM Continue pain management.  Innumerable bilateral pulmonary metastasis History of renal cell CA with left nephrectomy CT scan obtained on admission showed innumerable bilateral pulmonary metastases as well as possible liver mets and bone mets.  Oncology consult appreciated- suspected metastatic renal cell carcinoma 11/27, underwent liver biopsy .  Pending report. -may need radiation  Posterior left acetabular wall fracture of indeterminate age CT of the left hip showed small fracture of posterior acetabular wall of indeterminate age 39/22, seen by [facility], recommended PWB with assistance of a walker, Follow in office as outpt 3 weeks for repeat  xrays   Klebsiella UTI Patient was recently diagnosed with UTI, urine culture showing Klebsiella.   She has only taken 2 days of oral antibiotics, Augmentin, then 3 days of iv rocephin in the hospital, last dose on 11/24.    CKD stage IV history of renal cell carcinoma with left  nephrectomy Baseline creatinine close to 1.8.  Continue to monitor.    Type 2 diabetes mellitus with hypoglycemia A1c 6.4 on 10/25/2022 PTA on Lantus 12 units nightly. Currently blood sugar control as below on Lantus 5 units nightly.  COPD /Chronic hypoxic respiratory failure  on home O2 3.5liters -schedule nebs -will give dose IV lasix and monitor for response  Essential hypertension Blood pressure currently stable. PTA on carvedilol 12.5 mg twice daily, hydralazine 100 mg 3 times daily Continue both.  HLD Zetia, statin  Hypothyroidism On Synthroid 25 mcg daily on weekdays. Continue the same.   Thrombocytopenia, chronic Does has ecchymosis ,but no overt bleeding Management per hematology oncology Dr. Marin Olp  Failure to thrive Secondary to cancer.  Palliative consult appreciated.  Obesity Estimated body mass index is 32.41 kg/m as calculated from the following:   Height as of this encounter: _0  (1.6 m).   Weight as of this encounter: 83 kg.    Code Status: Full Code    Mobility: Encourage ambulation.  Will obtain PT eval after kyphoplasty.  Infusions:   sodium chloride     vancomycin      Scheduled Meds:  acetaminophen  1,000 mg Oral Q6H   bupivacaine(PF)       carvedilol  12.5 mg Oral BID WC   ezetimibe  10 mg Oral Daily   And   simvastatin  20 mg Oral q1800   feeding supplement  237 mL  Oral TID BM   fentaNYL  1 patch Transdermal Q72H   gabapentin  100 mg Oral QHS   heparin  5,000 Units Subcutaneous Q8H   hydrALAZINE  50 mg Oral Q8H   insulin aspart  0-5 Units Subcutaneous QHS   insulin aspart  0-9 Units Subcutaneous TID WC   insulin glargine-yfgn  5 Units  Subcutaneous QHS   ipratropium-albuterol  3 mL Nebulization Q6H   levothyroxine  25 mcg Oral Q MTWThF   lidocaine  1 patch Transdermal QHS   lidocaine (PF)       multivitamin with minerals  1 tablet Oral Daily   oxybutynin  5 mg Oral Daily   [START ON 11/12/2022] pneumococcal 23 valent vaccine  0.5 mL Intramuscular Tomorrow-1000    PRN meds: bisacodyl, bupivacaine(PF), HYDROmorphone (DILAUDID) injection, HYDROmorphone, lidocaine (PF), LORazepam, ondansetron, senna-docusate     Nutritional status:  Body mass index is 32.41 kg/m.  Nutrition Problem: Increased nutrient needs Etiology: acute illness, chronic illness Signs/Symptoms: estimated needs   DVT prophylaxis:  heparin injection 5,000 Units Start: 10/25/22 1400 SCDs Start: 10/25/22 0220   Consultants: Oncology, IR Family Communication: called daughter  Status is: Inpatient  Continue inhospital care because: Pending kyphoplasty Level of care: Med-Surg   Dispo: The patient is from: Home              Anticipated d/c is to: Pending clinical course              Patient currently is not medically stable to d/c.   Difficult to place patient No      Objective: Vitals:   11/02/22 0817 11/02/22 1204  BP:  (!) 117/52  Pulse: 65 74  Resp: 18 18  Temp:  97.9 F (36.6 C)  SpO2:  99%    Intake/Output Summary (Last 24 hours) at 11/02/2022 1240 Last data filed at 11/02/2022 1226 Gross per 24 hour  Intake 710 ml  Output 325 ml  Net 385 ml   Filed Weights   10/06/2022 1457  Weight: 83 kg   Weight change:  Body mass index is 32.41 kg/m.   Physical Exam:   General: Appearance:    Obese female in no acute distress     Lungs:     respirations unlabored  Heart:    Normal heart rate.   MS:   All extremities are intact.   Neurologic:   Awake, alert       Data Review: I have personally reviewed the laboratory data and studies available.   Signed, Geradine Girt, DO [facility] 11/02/2022

## 2022-11-02 NOTE — Progress Notes (Signed)
Unfortunately, we still do not have the path report back.  Hopefully, this will be back today.  She goes for the vertebroplasty today.  She is all worried about this.  We prayed about this.  This hopefully will give her some strength.  She feels like she is wheezing.  When I listen to her this morning, I do not hear any wheezing.  Her labs show white cell count of 4.7.  Hemoglobin 10.8.  Platelet count 90,000.  I would think that her platelet count should be okay for the vertebroplasty.  Her BUN is 43 creatinine 1.88.  Calcium is 7.1 with an albumin of 2.5.  She says she is eating okay.  There has been no nausea or vomiting.  She is out of bed a little bit.  She has had no bleeding.  She has had no diarrhea.  She would like to have the pneumonia vaccine.  I do not see a problem with her getting this.  Her vital signs show temperature of 98.5.  Pulse 61.  Blood pressure 130/48.  Oxygen saturation is 98%.  Her lungs sound clear bilaterally.  She has good air movement bilaterally.  Cardiac exam regular rate and rhythm.  There are no murmurs, rubs or bruits.  Abdomen is soft.  Bowel sounds are present.  There is no fluid wave.  Extremity shows no clubbing, cyanosis or edema.  Neurological exam is nonfocal.  We still are awaiting the pathology.  I still have to believe that this is can be metastatic renal cell carcinoma.  Is taken over a week to get the alpha-fetoprotein level back.  I hope that the lab has not lost this.  We will see how she does with the vertebroplasty.  She will need radiation therapy after this.  I suspect that she probably is going to have to go to skilled nursing or to rehab when she is ready for discharge.  I know that she has gotten incredible care from everybody upon [address].  I really appreciate all of their compassion.  [name], MD  Philippians 4:13

## 2022-11-02 NOTE — Progress Notes (Signed)
This chaplain is present with the Pt. for F/U spiritual care. The Pt. daughter-[name] is at the bedside.  The Pt. is awake and freely speaks about her fears and SOB she experienced with today's procedure, before it was postponed.  Margaret Dyer shares her hope the Pt. will loose enough fluid today, so anesthesia will not be needed for Friday's procedure.  The Pt. is ready to move forward with notarizing HCPOA on Friday. The chaplain understands the Pt. remains uncertain in the completion of a Living Will.  The Pt. spoke clearly about her desire not to be intubated. The chaplain will refer continued education to PMT NP-Tasha.   The chaplain will F/U with the Pt. On Friday.  The Pt. accepted the chaplain's invitation for prayer.  Chaplain Sallyanne Kuster [phone]

## 2022-11-02 NOTE — Anesthesia Preprocedure Evaluation (Deleted)
Anesthesia Evaluation  Patient identified by MRN, date of birth, ID band Patient awake    Reviewed: Allergy & Precautions, NPO status , Patient's Chart, lab work & pertinent test results, reviewed documented beta blocker date and time   Airway        Dental  (+) Upper Dentures, Lower Dentures   Pulmonary shortness of breath and with exertion, asthma , sleep apnea , pneumonia, resolved, former smoker Bilateral pulmonary metastasis          Cardiovascular hypertension, Pt. on medications and Pt. on home beta blockers pulmonary hypertension+ CAD, +CHF and + DOE    Moderate to severe Pulmonary HTN   Neuro/Psych  PSYCHIATRIC DISORDERS Anxiety Depression     Neuromuscular disease    GI/Hepatic negative GI ROS,,,(+) Cirrhosis       , Hepatitis -Hx/o NASH and hypersplenism   Endo/Other  diabetes, Well Controlled, Type 2, Insulin DependentHypothyroidism  Obesity Hyperlipidemia  Renal/GU Renal InsufficiencyRenal diseaseHx/o renal cell Ca S/P nephrectomy 2000 with possible recurrence and metastasis Hx/o renal calculi  negative genitourinary   Musculoskeletal  (+) Arthritis , Osteoarthritis,  L4 compression Fx with metastatic lesion Acetabular Fx left hip   Abdominal   Peds  Hematology  (+) Blood dyscrasia, anemia Hx/o thrombocytopenia- Plt 90k Vit B12 deficiency   Anesthesia Other Findings   Reproductive/Obstetrics                              Anesthesia Physical Anesthesia Plan  ASA: 3  Anesthesia Plan: General   Post-op Pain Management: Precedex and Dilaudid IV   Induction: Intravenous  PONV Risk Score and Plan: 3 and Ondansetron, Dexamethasone, Treatment may vary due to age or medical condition and Propofol infusion  Airway Management Planned: Oral ETT  Additional Equipment: None  Intra-op Plan:   Post-operative Plan: Extubation in OR  Informed Consent:   Plan Discussed with:    Anesthesia Plan Comments:          Anesthesia Quick Evaluation

## 2022-11-02 NOTE — Progress Notes (Signed)
Patient was scheduled for KP/Osteocool today, she was brought down to IR.  Patient seen with Dr. Karenann Cai, she appears to be short of breath, couldn't go through a setence w/o catching her breath, she states that she does not think she can be laying on her abdomen for a long time.   The procedure to be re-scheduled with anesthesia team, the procedure is scheduled for tomorrow AM. Attending provider notified.   NPO at MN, patient can receive sq heparin today, will hold for tomorrow.   Please call IR for questions and concerns.   Armando Gang Nehan Flaum PA-C 11/02/2022 9:38 AM

## 2022-11-03 ENCOUNTER — Encounter (HOSPITAL_COMMUNITY): Admission: EM | Disposition: E | Payer: Self-pay | Source: Home / Self Care | Attending: Internal Medicine

## 2022-11-03 ENCOUNTER — Other Ambulatory Visit (HOSPITAL_COMMUNITY): Payer: Medicare Other

## 2022-11-03 DIAGNOSIS — M8000XD Age-related osteoporosis with current pathological fracture, unspecified site, subsequent encounter for fracture with routine healing: Secondary | ICD-10-CM | POA: Diagnosis not present

## 2022-11-03 DIAGNOSIS — Z7189 Other specified counseling: Secondary | ICD-10-CM | POA: Diagnosis not present

## 2022-11-03 DIAGNOSIS — R52 Pain, unspecified: Secondary | ICD-10-CM | POA: Diagnosis not present

## 2022-11-03 DIAGNOSIS — Z515 Encounter for palliative care: Secondary | ICD-10-CM | POA: Diagnosis not present

## 2022-11-03 LAB — COMPREHENSIVE METABOLIC PANEL
ALT: 13 U/L (ref 0–44)
AST: 37 U/L (ref 15–41)
Albumin: 2.6 g/dL — ABNORMAL LOW (ref 3.5–5.0)
Alkaline Phosphatase: 122 U/L (ref 38–126)
Anion gap: 4 — ABNORMAL LOW (ref 5–15)
BUN: 42 mg/dL — ABNORMAL HIGH (ref 8–23)
CO2: 25 mmol/L (ref 22–32)
Calcium: 7 mg/dL — ABNORMAL LOW (ref 8.9–10.3)
Chloride: 111 mmol/L (ref 98–111)
Creatinine, Ser: 1.75 mg/dL — ABNORMAL HIGH (ref 0.44–1.00)
GFR, Estimated: 28 mL/min — ABNORMAL LOW (ref >60)
Glucose, Bld: 118 mg/dL — ABNORMAL HIGH (ref 70–99)
Potassium: 5.1 mmol/L (ref 3.5–5.1)
Sodium: 140 mmol/L (ref 135–145)
Total Bilirubin: 0.5 mg/dL (ref 0.3–1.2)
Total Protein: 5.8 g/dL — ABNORMAL LOW (ref 6.5–8.1)

## 2022-11-03 LAB — GLUCOSE, CAPILLARY
Glucose-Capillary: 105 mg/dL — ABNORMAL HIGH (ref 70–99)
Glucose-Capillary: 150 mg/dL — ABNORMAL HIGH (ref 70–99)
Glucose-Capillary: 153 mg/dL — ABNORMAL HIGH (ref 70–99)
Glucose-Capillary: 166 mg/dL — ABNORMAL HIGH (ref 70–99)

## 2022-11-03 LAB — CBC WITH DIFFERENTIAL/PLATELET
Abs Immature Granulocytes: 0.06 10*3/uL (ref 0.00–0.07)
Basophils Absolute: 0.1 10*3/uL (ref 0.0–0.1)
Basophils Relative: 1 %
Eosinophils Absolute: 0.3 10*3/uL (ref 0.0–0.5)
Eosinophils Relative: 6 %
HCT: 35.9 % — ABNORMAL LOW (ref 36.0–46.0)
Hemoglobin: 10.9 g/dL — ABNORMAL LOW (ref 12.0–15.0)
Immature Granulocytes: 1 %
Lymphocytes Relative: 19 %
Lymphs Abs: 0.9 10*3/uL (ref 0.7–4.0)
MCH: 28.6 pg (ref 26.0–34.0)
MCHC: 30.4 g/dL (ref 30.0–36.0)
MCV: 94.2 fL (ref 80.0–100.0)
Monocytes Absolute: 0.7 10*3/uL (ref 0.1–1.0)
Monocytes Relative: 14 %
Neutro Abs: 2.8 10*3/uL (ref 1.7–7.7)
Neutrophils Relative %: 59 %
Platelets: 92 10*3/uL — ABNORMAL LOW (ref 150–400)
RBC: 3.81 MIL/uL — ABNORMAL LOW (ref 3.87–5.11)
RDW: 16.1 % — ABNORMAL HIGH (ref 11.5–15.5)
WBC: 4.8 10*3/uL (ref 4.0–10.5)
nRBC: 0 % (ref 0.0–0.2)

## 2022-11-03 LAB — PROCALCITONIN: Procalcitonin: 0.45 ng/mL

## 2022-11-03 SURGERY — IR WITH ANESTHESIA
Anesthesia: General

## 2022-11-03 MED ORDER — SODIUM CHLORIDE 0.9 % IV SOLN
500.0000 mg | Freq: Two times a day (BID) | INTRAVENOUS | Status: AC
  Administered 2022-11-03 – 2022-11-07 (×10): 500 mg via INTRAVENOUS
  Filled 2022-11-03 (×12): qty 10

## 2022-11-03 MED ORDER — IPRATROPIUM-ALBUTEROL 0.5-2.5 (3) MG/3ML IN SOLN: 3.0000 mL | Freq: Four times a day (QID) | RESPIRATORY_TRACT | Status: DC | PRN

## 2022-11-03 MED ORDER — IPRATROPIUM-ALBUTEROL 0.5-2.5 (3) MG/3ML IN SOLN
3.0000 mL | Freq: Four times a day (QID) | RESPIRATORY_TRACT | Status: DC
  Administered 2022-11-03 – 2022-11-04 (×5): 3 mL via RESPIRATORY_TRACT
  Filled 2022-11-03 (×6): qty 3

## 2022-11-03 NOTE — Progress Notes (Signed)
Nutrition Follow-up  DOCUMENTATION CODES:   Not applicable  INTERVENTION:  Liberalize diet from a heart healthy to a regular diet to provide widest variety of menu options to enhance nutritional adequacy Ensure Enlive po TID, each supplement provides 350 kcal and 20 grams of protein. Request updated weight  NUTRITION DIAGNOSIS:   Increased nutrient needs related to acute illness, chronic illness as evidenced by estimated needs.  ongoing  GOAL:   Patient will meet greater than or equal to 90% of their needs  Goal unmet- addressing via meals and suppelements  MONITOR:   PO intake, Supplement acceptance, Labs, Weight trends  REASON FOR ASSESSMENT:   Consult Assessment of nutrition requirement/status  ASSESSMENT:   Pt admitted with intractable back pain. PMH significant for chronic respiratory failure, CHF, renal cell carcinoma s/p L nephrectomy (2000), liver cirrhosis, diabetes.  Kyphoplasty has been postponed d/t pt possible developing PNA.  Liver biopsy negative. Findings of cirrhosis. Oncology suspecting of metastatic renal cell carcinoma.   ST assessed- no signs of dysphagia.  PMT speaking with patient at time of visit. Pt receiving and consuming nutrition supplements. Will continue with order.   Meal completions:  11/25: 75% breakfast, 0% lunch 11/28: 15% breakfast, 100% lunch 11/29: 75% breakfast, 40% lunch  No updated weight on file to review.   Edema: non-pitting BUE/BLE  Medications: SSI 0-5 units qhs, SSI 0-9 units TID, semglee 5 units qhs, MVI  Labs: BUN 42, Cr 1.75, corrected Ca 8.12, GFR 28, CBG's 105-166 x24 hours  Diet Order:   Diet Order     None       EDUCATION NEEDS:   Education needs have been addressed  Skin:  Skin Assessment: Reviewed RN Assessment  Last BM:  11/20  Height:   Ht Readings from Last 1 Encounters:  10/14/2022 _0  (1.6 m)    Weight:   Wt Readings from Last 1 Encounters:  10/06/2022 83 kg   BMI:  Body mass  index is 32.41 kg/m.  Estimated Nutritional Needs:   Kcal:  1800-2000  Protein:  90-105g  Fluid:  >/=1.8L  Clayborne Dana, RDN, LDN Clinical Nutrition

## 2022-11-03 NOTE — Progress Notes (Signed)
Per Dr. Marin Olp, Ms. Stroh is not well enough to proceed with KP/Biopsy today.  This will be canceled.   IR available for reconsult as needed.  Electronically Signed: Pasty Spillers, PA-C 11/24/2022, 9:18 AM

## 2022-11-03 NOTE — Plan of Care (Signed)
  Problem: Skin Integrity: Goal: Risk for impaired skin integrity will decrease Outcome: Progressing   Problem: Tissue Perfusion: Goal: Adequacy of tissue perfusion will improve Outcome: Progressing   Problem: Education: Goal: Knowledge of General Education information will improve Description: Including pain rating scale, medication(s)/side effects and non-pharmacologic comfort measures Outcome: Progressing   Problem: Health Behavior/Discharge Planning: Goal: Ability to manage health-related needs will improve Outcome: Progressing

## 2022-11-03 NOTE — Progress Notes (Signed)
The chaplain provided continued Advance Directive education and an opportunity for the Pt. to ask clarifying questions.This chaplain is present with the Pt., Pt. daughter-[name], Pt. grandson, notary, and witnesses for the notarizing of the Pt. Advance Directive:  HCPOA only.   The Pt. chose her daughters: Margaret Dyer and Margaret Dyer as her healthcare agents. The Pt. documented her desire for both daughters to share equally the responsibility of HCPOA.  The Pt. did not complete a Living Will.  The chaplain gave the original AD to the Pt. along with two copies. A copy of the Pt. AD was scanned into the Pt. EMR.  This chaplain is available for F/U spiritual care as needed.  Chaplain Sallyanne Kuster [phone]

## 2022-11-03 NOTE — Progress Notes (Signed)
Unfortunately, she did not have the kyphoplasty yesterday.  She is having shortness of breath.  A chest x-ray seems to suggest that she might be trying to develop pneumonia in the right lung.  I really doubt that she we will have any kind of procedure today.  I do still think that she is stable enough for this.  I will start her on some meropenem.  I think this is reasonable option given that this is likely some kind of hospital-acquired infection.  I just feel bad that she is not can be able to have this done.  The other bad news is that her liver biopsy came back negative.  I know this was a very challenging biopsy to get.  He just Has cirrhosis.  I still cannot figure out why takes 10 days to get back in alpha-fetoprotein.  Something tells me that the lab lost the specimen.  I will have to reorder this.  Part of her labs came back showed a BUN of 42 creatinine 1.75.  Calcium is 7 with an albumin of 2.6.  Her CBC is not back yet.  She just looks weak right now.  Again, if she is developing pneumonia, this is certainly not a good thing for her.  She is using DuoNebs.  She is on the incentive spirometer.  Her appetite may not be as good right now.  She does not complain of any obvious pain.  I think she is sitting up a little bit.  Again, she is not going to really be able to have the kyphoplasty to the.  Again I think this pneumonia is more of a problem for her right now.  The fact that she had a negative liver biopsy certainly does not allow [place] to have a diagnosis yet.  I would think that she would have biopsies done when she undergoes the kyphoplasty.  If not, she will have to have a lung nodule biopsy.  Her vital signs show temperature 97.8.  Pulse 74.  Blood pressure 128/52.  Her lungs sound clear over on the left side.  She may have some occasional crackles on the right side.  I do not hear any wheezing.  Cardiac exam regular rate and rhythm.  Abdomen is soft.  Bowel sounds are present.   There is no fluid wave.  Extremity shows no clubbing, cyanosis or edema.  Neurological exam is nonfocal.  Again, she likely has metastatic renal cell carcinoma.  Again we will try to get a diagnosis.  This is certainly not been easy to do.  She will likely have the kyphoplasty put off today.  I do still think that she really is physically capable of doing this today given that this possible pneumonia.  Hopefully, the meropenem will help with this.  She is on nebulizers.  [name], MD  [name] 33:6

## 2022-11-03 NOTE — Evaluation (Signed)
Physical Therapy Evaluation Patient Details Name: Margaret Dyer MRN: [id number] DOB: Apr 30, 1935 Today's Date: 11/28/2022  History of Present Illness  87 yo female presents to [facility] on 11/21 with weakness, back/L hip pain, fall x1 month ago.  Pt found to have compression vs pathologic fracture of L4, L acetabular fx. CT scan of the chest/abdomen/pelvis shows multiple bilat pulmonary mets, hepatic mets. S/p liver mass biopsy 11/27, plan for KP on 11/30 but pt too ShOB, CXR shows R PNA. PMH significant for chronic respiratory failure on 3.5LO2 at baseline, DM2, HTN, HLD, chronic diastolic CHF, pulmonary hypertension, liver cirrhosis, CKD 3, renal cell carcinoma with left nephrectomy 2000, hypothyroidism, osteopenia, L hip IMN 2020.  Clinical Impression   Pt presents with back pain, decreased knowledge of back precautions and PWB LLE precautions, impaired balance with history of falls, generalized weakness, and impaired activity tolerance. Pt to benefit from acute PT to address deficits. Pt tolerating stand pivot level mobility at this time, limited by pain, weakness. Pt requiring moderate physical assist for mobility, pt open to ST-SNF post-acutely. PT to progress mobility as tolerated, and will continue to follow acutely.         Recommendations for follow up therapy are one component of a multi-disciplinary discharge planning process, led by the attending physician.  Recommendations may be updated based on patient status, additional functional criteria and insurance authorization.  Follow Up Recommendations Skilled nursing-short term rehab (<3 hours/day) Can patient physically be transported by private vehicle: Yes    Assistance Recommended at Discharge Frequent or constant Supervision/Assistance  Patient can return home with the following  A lot of help with walking and/or transfers;A lot of help with bathing/dressing/bathroom    Equipment Recommendations None recommended by PT  Recommendations  for Other Services       Functional Status Assessment Patient has had a recent decline in their functional status and demonstrates the ability to make significant improvements in function in a reasonable and predictable amount of time.     Precautions / Restrictions Precautions Precautions: Fall;Back Precaution Comments: reviewed log roll OOB, use of brace OOB Restrictions Weight Bearing Restrictions: Yes LLE Weight Bearing: Partial weight bearing LLE Partial Weight Bearing Percentage or Pounds: none specified, ortho states "PWB with assistance of a walker"      Mobility  Bed Mobility Overal bed mobility: Needs Assistance Bed Mobility: Supine to Sit     Supine to sit: HOB elevated, Mod assist     General bed mobility comments: assist for log roll, trunk elevation    Transfers Overall transfer level: Needs assistance Equipment used: Rolling walker (2 wheels) Transfers: Sit to/from Stand, Bed to chair/wheelchair/BSC Sit to Stand: Mod assist Stand pivot transfers: Mod assist         General transfer comment: assist for power up, rise, staedying, and transfer to/from [facility]. STS x3, from EOB x2 and BSCx1.    Ambulation/Gait               General Gait Details: NT  Stairs            Wheelchair Mobility    Modified Rankin (Stroke Patients Only)       Balance Overall balance assessment: Needs assistance, History of Falls Sitting-balance support: No upper extremity supported, Feet supported Sitting balance-Leahy Scale: Fair     Standing balance support: Bilateral upper extremity supported, During functional activity, Reliant on assistive device for balance Standing balance-Leahy Scale: Poor  Pertinent Vitals/Pain Pain Assessment Pain Assessment: Faces Faces Pain Scale: Hurts even more Pain Location: back Pain Descriptors / Indicators: Sore, Discomfort, Grimacing Pain Intervention(s): Limited activity within  patient's tolerance, Monitored during session, Repositioned    Home Living Family/patient expects to be discharged to:: Private residence Living Arrangements: Children Available Help at Discharge: Family Type of Home: House Home Access: Stairs to enter Entrance Stairs-Rails: Psychiatric nurse of Steps: 4   Home Layout: One level Home Equipment: Rollator (4 wheels);Tub bench;Grab bars - tub/shower      Prior Function Prior Level of Function : Needs assist             Mobility Comments: pt reports using rollator to walk ADLs Comments: pt reports indep with dressing and bathing, does not bathe in shower anymore given history of falls. Pt's daughter cooks, Development worker, international aid Domin[employer]   Dominant Hand: Right    Extremity/Trunk Assessment   Upper Extremity Assessment Upper Extremity Assessment: Defer to OT evaluation    Lower Extremity Assessment Lower Extremity Assessment: Generalized weakness    Cervical / Trunk Assessment Cervical / Trunk Assessment: Other exceptions;Kyphotic Cervical / Trunk Exceptions: severe back pain  Communication   Communication: No difficulties  Cognition Arousal/Alertness: Awake/alert Behavior During Therapy: WFL for tasks assessed/performed Overall Cognitive Status: Within Functional Limits for tasks assessed                                          General Comments General comments (skin integrity, edema, etc.): bruising all over body    Exercises     Assessment/Plan    PT Assessment Patient needs continued PT services  PT Problem List Decreased strength;Decreased mobility;Decreased activity tolerance;Decreased balance;Decreased knowledge of use of DME;Pain;Decreased safety awareness;Decreased skin integrity;Obesity       PT Treatment Interventions DME instruction;Therapeutic activities;Gait training;Patient/family education;Therapeutic exercise;Balance training;Functional mobility  training;Neuromuscular re-education    PT Goals (Current goals can be found in the Care Plan section)  Acute Rehab PT Goals PT Goal Formulation: With patient Time For Goal Achievement: 11/17/22 Potential to Achieve Goals: Good    Frequency Min 2X/week     Co-evaluation               AM-PAC PT "6 Clicks" Mobility  Outcome Measure Help needed turning from your back to your side while in a flat bed without using bedrails?: A Lot Help needed moving from lying on your back to sitting on the side of a flat bed without using bedrails?: A Lot Help needed moving to and from a bed to a chair (including a wheelchair)?: A Lot Help needed standing up from a chair using your arms (e.g., wheelchair or bedside chair)?: A Lot Help needed to walk in hospital room?: A Lot Help needed climbing 3-5 steps with a railing? : Total 6 Click Score: 11    End of Session Equipment Utilized During Treatment: Back brace Activity Tolerance: Patient limited by fatigue;Patient limited by pain Patient left: in chair;with call bell/phone within reach;with chair alarm set Nurse Communication: Mobility status PT Visit Diagnosis: Other abnormalities of gait and mobility (R26.89);Muscle weakness (generalized) (M62.81)    Time: 0962-8366 PT Time Calculation (min) (ACUTE ONLY): 23 min   Charges:   PT Evaluation $PT Eval Low Complexity: 1 Low PT Treatments $Therapeutic Activity: 8-22 mins       Kelyn Ponciano S, PT DPT Acute Rehabilitation  Services Pager [phone]  Office [phone]   Louis Matte 11/18/2022, 5:46 PM

## 2022-11-03 NOTE — Progress Notes (Signed)
PROGRESS NOTE  Margaret Dyer  DOB: [birth year]  PCP: Ann Held, DO WNI:[id number]  DOA: 10/23/2022  LOS: 9 days  Hospital Day: 11  Brief narrative: Margaret Dyer is a 87 y.o. female with PMH significant for DM2, HTN, HLD, chronic diastolic CHF, pulmonary hypertension, liver cirrhosis, CKD 3, renal cell carcinoma with left nephrectomy 2000, hypothyroidism. 11/21, patient presented to the ED with complaint of intractable back pain after a fall CT scan showed acute compression fracture of L4 vertebral body.  It also showed innumerable bilateral pulmonary metastases, posterior left acetabular wall fracture of indeterminate age. MRI confirmed the findings. Admitted to hospitalist service Consultation was obtained from radiology, oncology -IR procedure postponed due to patient not being able to lay on abdomen.    Subjective: Not feeling worse  Assessment and plan: Intractable back pain with compression fracture of L4 with concern for pathological fracture and metastasis MRI, CT of the pelvis showed compression fracture of L4, pathological with 30% vertebral height loss Interventional radiology consulted for vertebroplasty/ablation and bone biopsy. Aspirin on hold.   -retry kyphoplasy when stable Continue pain management.  PNA -pro calcitonin elevated but no fever, no WBC -x ray suggestive of infiltrate -IV abx started 12/1  Innumerable bilateral pulmonary metastasis History of renal cell CA with left nephrectomy CT scan obtained on admission showed innumerable bilateral pulmonary metastases as well as possible liver mets and bone mets.  Oncology consult appreciated- suspected metastatic renal cell carcinoma 11/27, underwent liver biopsy .  Pending report. -may need radiation  Posterior left acetabular wall fracture of indeterminate age CT of the left hip showed small fracture of posterior acetabular wall of indeterminate age 2/22, seen by [facility],  recommended PWB with assistance of a walker, Follow in office as outpt 3 weeks for repeat xrays   Klebsiella UTI Patient was recently diagnosed with UTI, urine culture showing Klebsiella.   She has only taken 2 days of oral antibiotics, Augmentin, then 3 days of iv rocephin in the hospital, last dose on 11/24.    CKD stage IV history of renal cell carcinoma with left  nephrectomy Baseline creatinine close to 1.8.  Continue to monitor.    Type 2 diabetes mellitus with hypoglycemia A1c 6.4 on 10/25/2022 PTA on Lantus 12 units nightly. Currently blood sugar control as below on Lantus 5 units nightly.  COPD /Chronic hypoxic respiratory failure  on home O2 3.5liters -schedule nebs -will give dose IV lasix and monitor for response  Essential hypertension Blood pressure currently stable. PTA on carvedilol 12.5 mg twice daily, hydralazine 100 mg 3 times daily Continue both.  HLD Zetia, statin  Hypothyroidism On Synthroid 25 mcg daily on weekdays. Continue the same.   Thrombocytopenia, chronic Does has ecchymosis ,but no overt bleeding Management per hematology oncology Dr. Marin Olp  Failure to thrive Secondary to cancer.  Palliative consult appreciated.  Obesity Estimated body mass index is 32.41 kg/m as calculated from the following:   Height as of this encounter: _0  (1.6 m).   Weight as of this encounter: 83 kg.    Code Status: Partial Code    Mobility: Encourage ambulation.  PT eval  Infusions:   sodium chloride     meropenem (MERREM) IV 500 mg (11/05/2022 0935)    Scheduled Meds:  acetaminophen  1,000 mg Oral Q6H   carvedilol  12.5 mg Oral BID WC   ezetimibe  10 mg Oral Daily   And   simvastatin  20 mg Oral q1800  feeding supplement  237 mL Oral TID BM   fentaNYL  1 patch Transdermal Q72H   gabapentin  100 mg Oral QHS   heparin  5,000 Units Subcutaneous Q8H   hydrALAZINE  50 mg Oral Q8H   insulin aspart  0-5 Units Subcutaneous QHS   insulin aspart  0-9  Units Subcutaneous TID WC   insulin glargine-yfgn  5 Units Subcutaneous QHS   levothyroxine  25 mcg Oral Q MTWThF   lidocaine  1 patch Transdermal QHS   multivitamin with minerals  1 tablet Oral Daily   oxybutynin  5 mg Oral Daily    PRN meds: bisacodyl, HYDROmorphone (DILAUDID) injection, HYDROmorphone, ipratropium-albuterol, LORazepam, ondansetron, senna-docusate     Nutritional status:  Body mass index is 32.41 kg/m.  Nutrition Problem: Increased nutrient needs Etiology: acute illness, chronic illness Signs/Symptoms: estimated needs   DVT prophylaxis:  heparin injection 5,000 Units Start: 10/25/22 1400 SCDs Start: 10/25/22 0220   Consultants: Oncology, IR Family Communication: called daughter  Status is: Inpatient  Continue inhospital care because: Pending kyphoplasty Level of care: Med-Surg   Dispo: The patient is from: Home              Anticipated d/c is to: Pending clinical course              Patient currently is not medically stable to d/c.   Difficult to place patient No      Objective: Vitals:   11/17/2022 0539 11/23/2022 0835  BP: (!) 128/52 (!) 138/56  Pulse: 74 74  Resp: 18 17  Temp: 97.8 F (36.6 C) 97.8 F (36.6 C)  SpO2: 100% 97%    Intake/Output Summary (Last 24 hours) at 11/05/2022 1336 Last data filed at 11/09/2022 0931 Gross per 24 hour  Intake 220 ml  Output 275 ml  Net -55 ml   Filed Weights   10/25/2022 1457  Weight: 83 kg   Weight change:  Body mass index is 32.41 kg/m.   Physical Exam:    General: Appearance:    Obese female in no acute distress     Lungs:     respirations unlabored, diminished - no wheezing  Heart:    Normal heart rate. Normal rhythm. No murmurs, rubs, or gallops.   MS:   All extremities are intact.   Neurologic:   Awake, alert         Data Review: I have personally reviewed the laboratory data and studies available.   Signed, Geradine Girt, DO [facility] 11/25/2022

## 2022-11-03 NOTE — Plan of Care (Signed)
  Problem: Education: Goal: Ability to describe self-care measures that may prevent or decrease complications (Diabetes Survival Skills Education) will improve Outcome: Progressing   Problem: Nutritional: Goal: Maintenance of adequate nutrition will improve Outcome: Progressing Goal: Progress toward achieving an optimal weight will improve Outcome: Progressing

## 2022-11-03 NOTE — Progress Notes (Signed)
Palliative: Mrs. Steier is lying quietly in bed.  She greets me, making and mostly keeping eye contact.  She is alert and oriented, able to make her needs known.  Her sister Romie Minus and her nephew Claiborne Billings are present at bedside.  She gives me permission to speak in front of them.  We talk about her visit with Dr. Marin Olp this morning.  She tells me that she understands that she has pneumonia.  She shares that she does not feel that she can lie on her stomach long enough to have kyphoplasty.  I encouraged her to turn, cough, deep breathe.  We talk about what is believed to be a recurrence of cancer.  Mrs. Bari shares that she felt that after 20+ years she was safe from having cancer recur.  She shares that she would not want chemotherapy, but is open to radiation therapy.  She tells me that she saw what her daughter went through with chemo.  She asks if her spinal issue could heal without kyphoplasty, and I shared that this is unlikely.  We talk about the benefits of stabilizing her vertebra for pain.  We talk about CODE STATUS.  Mrs. Desrosiers continues to state that she would want to be shocked "2 or 3 times" but would never want "that to be in my throat".  She shares that her husband was intubated.  She also states adamantly, "don't tie me down".  We talk about attempted resuscitation working in tandem, all pieces being intracranial.  Orders adjusted to show DNI but otherwise full scope.  I shared that what she wants today may be different next week or next month.  Conference with attending, bedside nursing staff, PMT chaplain, transition of care team related to patient condition, needs, goals of care, disposition.  Plan: DNI, time for outcomes.  Still awaiting some cancer marker testing.  Kyphoplasty and bone biopsy when able/stable.   37 minutes  Quinn Axe, NP Palliative medicine team Team phone [phone] greater than 50% Greater than 50% of this time was spent counseling and coordinating care related  to the above assessment and plan.

## 2022-11-03 DEATH — deceased

## 2022-11-04 ENCOUNTER — Inpatient Hospital Stay (HOSPITAL_COMMUNITY): Payer: Medicare Other

## 2022-11-04 DIAGNOSIS — R52 Pain, unspecified: Secondary | ICD-10-CM | POA: Diagnosis not present

## 2022-11-04 LAB — COMPREHENSIVE METABOLIC PANEL
ALT: 11 U/L (ref 0–44)
AST: 34 U/L (ref 15–41)
Albumin: 2.5 g/dL — ABNORMAL LOW (ref 3.5–5.0)
Alkaline Phosphatase: 121 U/L (ref 38–126)
Anion gap: 7 (ref 5–15)
BUN: 36 mg/dL — ABNORMAL HIGH (ref 8–23)
CO2: 25 mmol/L (ref 22–32)
Calcium: 7.2 mg/dL — ABNORMAL LOW (ref 8.9–10.3)
Chloride: 108 mmol/L (ref 98–111)
Creatinine, Ser: 1.72 mg/dL — ABNORMAL HIGH (ref 0.44–1.00)
GFR, Estimated: 29 mL/min — ABNORMAL LOW (ref >60)
Glucose, Bld: 137 mg/dL — ABNORMAL HIGH (ref 70–99)
Potassium: 5.1 mmol/L (ref 3.5–5.1)
Sodium: 140 mmol/L (ref 135–145)
Total Bilirubin: 0.7 mg/dL (ref 0.3–1.2)
Total Protein: 5.4 g/dL — ABNORMAL LOW (ref 6.5–8.1)

## 2022-11-04 LAB — GLUCOSE, CAPILLARY
Glucose-Capillary: 145 mg/dL — ABNORMAL HIGH (ref 70–99)
Glucose-Capillary: 166 mg/dL — ABNORMAL HIGH (ref 70–99)
Glucose-Capillary: 125 mg/dL — ABNORMAL HIGH (ref 70–99)
Glucose-Capillary: 154 mg/dL — ABNORMAL HIGH (ref 70–99)

## 2022-11-04 LAB — CBC WITH DIFFERENTIAL/PLATELET
Abs Immature Granulocytes: 0.03 10*3/uL (ref 0.00–0.07)
Basophils Absolute: 0 10*3/uL (ref 0.0–0.1)
Basophils Relative: 1 %
Eosinophils Absolute: 0.2 10*3/uL (ref 0.0–0.5)
Eosinophils Relative: 5 %
HCT: 35.9 % — ABNORMAL LOW (ref 36.0–46.0)
Hemoglobin: 10.7 g/dL — ABNORMAL LOW (ref 12.0–15.0)
Immature Granulocytes: 1 %
Lymphocytes Relative: 20 %
Lymphs Abs: 1 10*3/uL (ref 0.7–4.0)
MCH: 28.7 pg (ref 26.0–34.0)
MCHC: 29.8 g/dL — ABNORMAL LOW (ref 30.0–36.0)
MCV: 96.2 fL (ref 80.0–100.0)
Monocytes Absolute: 0.8 10*3/uL (ref 0.1–1.0)
Monocytes Relative: 16 %
Neutro Abs: 2.8 10*3/uL (ref 1.7–7.7)
Neutrophils Relative %: 57 %
Platelets: 95 10*3/uL — ABNORMAL LOW (ref 150–400)
RBC: 3.73 MIL/uL — ABNORMAL LOW (ref 3.87–5.11)
RDW: 16.6 % — ABNORMAL HIGH (ref 11.5–15.5)
WBC: 4.8 10*3/uL (ref 4.0–10.5)
nRBC: 0 % (ref 0.0–0.2)

## 2022-11-04 MED ORDER — TORSEMIDE 20 MG PO TABS
20.0000 mg | ORAL_TABLET | Freq: Every day | ORAL | Status: DC
  Administered 2022-11-04 – 2022-11-06 (×3): 20 mg via ORAL
  Filled 2022-11-04 (×3): qty 1

## 2022-11-04 MED ORDER — IPRATROPIUM-ALBUTEROL 0.5-2.5 (3) MG/3ML IN SOLN
3.0000 mL | Freq: Two times a day (BID) | RESPIRATORY_TRACT | Status: DC
  Administered 2022-11-05 – 2022-11-08 (×7): 3 mL via RESPIRATORY_TRACT
  Filled 2022-11-04 (×7): qty 3

## 2022-11-04 NOTE — Progress Notes (Signed)
PROGRESS NOTE  Margaret Dyer  DOB: December 18, 1934  PCP: Ann Held, DO KRC:[id number]  DOA: 10/16/2022  LOS: 10 days  Hospital Day: 12  Brief narrative: Margaret Dyer is a 87 y.o. female with PMH significant for DM2, HTN, HLD, chronic diastolic CHF, pulmonary hypertension, liver cirrhosis, CKD 3, renal cell carcinoma with left nephrectomy 2000, hypothyroidism. 11/21, patient presented to the ED with complaint of intractable back pain after a fall CT scan showed acute compression fracture of L4 vertebral body.  It also showed innumerable bilateral pulmonary metastases, posterior left acetabular wall fracture of indeterminate age. MRI confirmed the findings. Admitted to hospitalist service Consultation was obtained from radiology, oncology -IR procedure postponed due to patient not being able to lay on abdomen.    Subjective: Using incentive spirometry Daughter reports having a URI  Assessment and plan: Intractable back pain with compression fracture of L4 with concern for pathological fracture and metastasis MRI, CT of the pelvis showed compression fracture of L4, pathological with 30% vertebral height loss Interventional radiology consulted for vertebroplasty/ablation and bone biopsy. Aspirin on hold.   -retry kyphoplasy when stable Continue pain management.  PNA -pro calcitonin elevated but no fever, no WBC -x ray suggestive of infiltrate -IV abx started 12/1  Innumerable bilateral pulmonary metastasis History of renal cell CA with left nephrectomy CT scan obtained on admission showed innumerable bilateral pulmonary metastases as well as possible liver mets and bone mets.  Oncology consult appreciated- suspected metastatic renal cell carcinoma 11/27, underwent liver biopsy .  Pending report. -may need radiation  Posterior left acetabular wall fracture of indeterminate age CT of the left hip showed small fracture of posterior acetabular wall of indeterminate  age [age], seen by [facility], recommended PWB with assistance of a walker, Follow in office as outpt 3 weeks for repeat xrays   Klebsiella UTI Patient was recently diagnosed with UTI, urine culture showing Klebsiella.   She has only taken 2 days of oral antibiotics, Augmentin, then 3 days of iv rocephin in the hospital, last dose on 11/24.    CKD stage IV history of renal cell carcinoma with left  nephrectomy Baseline creatinine close to 1.8.  Continue to monitor.    Type 2 diabetes mellitus with hypoglycemia A1c 6.4 on 10/25/2022 PTA on Lantus 12 units nightly. Currently blood sugar control as below on Lantus 5 units nightly.  COPD /Chronic hypoxic respiratory failure  on home O2 3.5liters -schedule nebs -will give dose IV lasix and monitor for response  Essential hypertension Blood pressure currently stable. PTA on carvedilol 12.5 mg twice daily, hydralazine 100 mg 3 times daily Continue both.  HLD Zetia, statin  Hypothyroidism On Synthroid 25 mcg daily on weekdays. Continue the same.   Thrombocytopenia, chronic Does has ecchymosis ,but no overt bleeding Management per hematology oncology Dr. Marin Olp  Failure to thrive Secondary to cancer.  Palliative consult appreciated.  Obesity Estimated body mass index is 33.51 kg/m as calculated from the following:   Height as of this encounter: _0  (1.6 m).   Weight as of this encounter: 85.8 kg.    Code Status: Partial Code    Mobility: Encourage ambulation.  PT eval  Infusions:   sodium chloride     meropenem (MERREM) IV 500 mg (11/04/22 1114)    Scheduled Meds:  acetaminophen  1,000 mg Oral Q6H   carvedilol  12.5 mg Oral BID WC   ezetimibe  10 mg Oral Daily   And   simvastatin  20 mg Oral q1800   feeding supplement  237 mL Oral TID BM   fentaNYL  1 patch Transdermal Q72H   gabapentin  100 mg Oral QHS   heparin  5,000 Units Subcutaneous Q8H   hydrALAZINE  50 mg Oral Q8H   insulin aspart  0-5  Units Subcutaneous QHS   insulin aspart  0-9 Units Subcutaneous TID WC   insulin glargine-yfgn  5 Units Subcutaneous QHS   ipratropium-albuterol  3 mL Nebulization Q6H   levothyroxine  25 mcg Oral Q MTWThF   lidocaine  1 patch Transdermal QHS   multivitamin with minerals  1 tablet Oral Daily   oxybutynin  5 mg Oral Daily   torsemide  20 mg Oral Daily    PRN meds: bisacodyl, HYDROmorphone (DILAUDID) injection, HYDROmorphone, LORazepam, ondansetron, senna-docusate     Nutritional status:  Body mass index is 33.51 kg/m.  Nutrition Problem: Increased nutrient needs Etiology: acute illness, chronic illness Signs/Symptoms: estimated needs   DVT prophylaxis:  heparin injection 5,000 Units Start: 10/25/22 1400 SCDs Start: 10/25/22 0220   Consultants: Oncology, IR Family Communication: called daughter  Status is: Inpatient  Continue inhospital care because: Pending kyphoplasty Level of care: Med-Surg   Dispo: The patient is from: Home              Anticipated d/c is to: Pending clinical course              Patient currently is not medically stable to d/c.   Difficult to place patient No      Objective: Vitals:   11/04/22 0754 11/04/22 0924  BP: (!) 116/44   Pulse: 65   Resp: 18   Temp: 98 F (36.7 C)   SpO2: 99% 96%    Intake/Output Summary (Last 24 hours) at 11/04/2022 1251 Last data filed at 11/04/2022 1017 Gross per 24 hour  Intake 240 ml  Output --  Net 240 ml   Filed Weights   11/02/2022 1457 11/25/2022 1635  Weight: 83 kg 85.8 kg   Weight change:  Body mass index is 33.51 kg/m.   Physical Exam:     General: Appearance:    Obese female in no acute distress     Lungs:     Clear to auscultation bilaterally, respirations unlabored, on [place]  Heart:    Normal heart rate. Normal rhythm. No murmurs, rubs, or gallops.   MS:   All extremities are intact.   Neurologic:   Awake, alert, oriented x 3. No apparent focal neurological           defect.             Data Review: I have personally reviewed the laboratory data and studies available.   Signed, Geradine Girt, DO [facility] 11/04/2022

## 2022-11-04 NOTE — Progress Notes (Signed)
Ms. Arata really is about the same.  She still has some shortness of breath.  She is on antibiotics for this consolidation/pneumonia.  I think would be worthwhile getting another chest x-ray to see what might be going on.  She sits in a chair 3 hours a day she says.  She does have the back pain when she moves around.  We are still awaiting the kyphoplasty L4.  Unfortunately, because of this pneumonia, she really has not been physically able to do the procedure.  I still do not have back the alpha-fetoprotein.  I am not sure why takes over 11 days to get this back.  I will reorder this.  The liver biopsy was negative so this does not help [place].  Her labs show white cell count 4.8.  Hemoglobin 10.7.  Platelet count 95,000.  Sodium 140.  Potassium 5.1.  BUN 36 creatinine 1.72.  Calcium is 7.2 with an albumin of 2.5.  She has had no productive mucus.  There is been no hemoptysis.  She has had no diarrhea.  Her vital signs are temperature 98.5.  Pulse 66.  Blood pressure 118/52.  Her lungs sound pretty clear.  There may be little bit of wheezing bilaterally.  Cardiac exam regular rate and rhythm.  Abdomen is soft.  At this point, we still are not sure what kind of malignancy were dealing with.  I would have hoped that the liver biopsy would have told us.  Since that was negative, I thought that the L4 biopsy/kyphoplasty would help with his.  I am unsure if she is going to be able to have this anytime soon unless she makes a significant recovery from this pneumonia.  The other option would be to do a lung biopsy.  She has easily accessible lung nodules.  We will just have to see how things go over the weekend for her.  Hopefully with the IV antibiotic, the pneumonia will improve.  We will see what a chest x-ray shows on her today.  Nutrition is incredibly important.  I do appreciate the wonderful care that she is getting from everybody up on [address]  [name], MD  Lurena Joiner 1:37

## 2022-11-04 NOTE — Plan of Care (Signed)

## 2022-11-05 ENCOUNTER — Inpatient Hospital Stay (HOSPITAL_COMMUNITY): Payer: Medicare Other

## 2022-11-05 DIAGNOSIS — R52 Pain, unspecified: Secondary | ICD-10-CM | POA: Diagnosis not present

## 2022-11-05 LAB — GLUCOSE, CAPILLARY
Glucose-Capillary: 171 mg/dL — ABNORMAL HIGH (ref 70–99)
Glucose-Capillary: 211 mg/dL — ABNORMAL HIGH (ref 70–99)
Glucose-Capillary: 80 mg/dL (ref 70–99)
Glucose-Capillary: 97 mg/dL (ref 70–99)

## 2022-11-05 LAB — CBC WITH DIFFERENTIAL/PLATELET
Abs Immature Granulocytes: 0.02 10*3/uL (ref 0.00–0.07)
Basophils Absolute: 0 10*3/uL (ref 0.0–0.1)
Basophils Relative: 1 %
Eosinophils Absolute: 0.2 10*3/uL (ref 0.0–0.5)
Eosinophils Relative: 4 %
HCT: 35 % — ABNORMAL LOW (ref 36.0–46.0)
Hemoglobin: 11 g/dL — ABNORMAL LOW (ref 12.0–15.0)
Immature Granulocytes: 0 %
Lymphocytes Relative: 18 %
Lymphs Abs: 0.8 10*3/uL (ref 0.7–4.0)
MCH: 29.6 pg (ref 26.0–34.0)
MCHC: 31.4 g/dL (ref 30.0–36.0)
MCV: 94.1 fL (ref 80.0–100.0)
Monocytes Absolute: 0.7 10*3/uL (ref 0.1–1.0)
Monocytes Relative: 15 %
Neutro Abs: 2.9 10*3/uL (ref 1.7–7.7)
Neutrophils Relative %: 62 %
Platelets: 99 10*3/uL — ABNORMAL LOW (ref 150–400)
RBC: 3.72 MIL/uL — ABNORMAL LOW (ref 3.87–5.11)
RDW: 16.6 % — ABNORMAL HIGH (ref 11.5–15.5)
WBC: 4.6 10*3/uL (ref 4.0–10.5)
nRBC: 0 % (ref 0.0–0.2)

## 2022-11-05 LAB — BASIC METABOLIC PANEL
Anion gap: 11 (ref 5–15)
BUN: 39 mg/dL — ABNORMAL HIGH (ref 8–23)
CO2: 23 mmol/L (ref 22–32)
Calcium: 7 mg/dL — ABNORMAL LOW (ref 8.9–10.3)
Chloride: 107 mmol/L (ref 98–111)
Creatinine, Ser: 1.9 mg/dL — ABNORMAL HIGH (ref 0.44–1.00)
GFR, Estimated: 26 mL/min — ABNORMAL LOW (ref >60)
Glucose, Bld: 166 mg/dL — ABNORMAL HIGH (ref 70–99)
Potassium: 5.1 mmol/L (ref 3.5–5.1)
Sodium: 141 mmol/L (ref 135–145)

## 2022-11-05 LAB — PROCALCITONIN: Procalcitonin: 0.31 ng/mL

## 2022-11-05 MED ORDER — SENNOSIDES-DOCUSATE SODIUM 8.6-50 MG PO TABS
1.0000 | ORAL_TABLET | Freq: Every day | ORAL | Status: DC
  Administered 2022-11-05 – 2022-11-17 (×13): 1 via ORAL
  Filled 2022-11-05 (×13): qty 1

## 2022-11-05 MED ORDER — IPRATROPIUM-ALBUTEROL 0.5-2.5 (3) MG/3ML IN SOLN
3.0000 mL | Freq: Once | RESPIRATORY_TRACT | Status: AC
  Administered 2022-11-05: 3 mL via RESPIRATORY_TRACT
  Filled 2022-11-05: qty 3

## 2022-11-05 NOTE — Plan of Care (Signed)
  Problem: Fluid Volume: Goal: Ability to maintain a balanced intake and output will improve Outcome: Progressing   Problem: Metabolic: Goal: Ability to maintain appropriate glucose levels will improve Outcome: Progressing   Problem: Education: Goal: Knowledge of General Education information will improve Description: Including pain rating scale, medication(s)/side effects and non-pharmacologic comfort measures Outcome: Progressing   Problem: Skin Integrity: Goal: Risk for impaired skin integrity will decrease Outcome: Progressing

## 2022-11-05 NOTE — TOC Initial Note (Signed)
Transition of Care [facility]) - Initial/Assessment Note    Patient Details  Name: Margaret Dyer MRN: [id number] Date of Birth: 1935-11-05  Transition of [facility]) CM/SW Contact:    Emeterio Reeve, LCSW Phone Number: 11/05/2022, 10:10 AM  Clinical Narrative:                  CSW received SNF consult. CSW met with pt at bedside. CSW introduced self and explained role at the hospital. Pt reports that PTA she lived at home with her daughter. Pt reports she was independent with mobility but used a walker and independent with ADL's.   CSW reviewed PT/OT recommendations for SNF. Pt reports she is ok with going to SN. Pt gave Pt has been to [facility] in the past and only gives permission to fax to them. Pt declined medicare.gov list. CSW explained insurance auth process.  CSW will continue to follow.  Expected Discharge Plan: Skilled Nursing Facility Barriers to Discharge: Continued Medical Work up, Ship broker, SNF Pending bed offer   Patient Goals and CMS Choice Patient states their goals for this hospitalization and ongoing recovery are:: To go to SNF CMS Medicare.gov Compare Post Acute Care list provided to:: Patient Choice offered to / list presented to : Patient  Expected Discharge Plan and Services Expected Discharge Plan: [place]       Living arrangements for the past 2 months: Single Family Home                                      Prior Living Arrangements/Services Living arrangements for the past 2 months: Single Family Home Lives with:: Adult Children Patient language and need for interpreter reviewed:: Yes Do you feel safe going back to the place where you live?: Yes      Need for Family Participation in Patient Care: Yes (Comment) Care giver support system in place?: Yes (comment) Current home services: DME Criminal Activity/Legal Involvement Pertinent to Current Situation/Hospitalization: No - Comment as needed  Activities of  Daily Living Home Assistive Devices/Equipment: Gilford Rile (specify type) ADL Screening (condition at time of admission) Patient's cognitive ability adequate to safely complete daily activities?: Yes Is the patient deaf or have difficulty hearing?: No Does the patient have difficulty seeing, even when wearing glasses/contacts?: No Does the patient have difficulty concentrating, remembering, or making decisions?: No Patient able to express need for assistance with ADLs?: Yes Does the patient have difficulty dressing or bathing?: No Independently performs ADLs?: Yes (appropriate for developmental age) Does the patient have difficulty walking or climbing stairs?: Yes Weakness of Legs: Both Weakness of Arms/Hands: Both  Permission Sought/Granted Permission sought to share information with : Customer service manager, Family Supports Permission granted to share information with : Yes, Verbal Permission Granted     Permission granted to share info w AGENCY: SNF        Emotional Assessment Appearance:: Appears stated age Attitude/Demeanor/Rapport: Engaged Affect (typically observed): Appropriate Orientation: : Oriented to Self, Oriented to Place, Oriented to  Time, Oriented to Situation Alcohol / Substance Use: Not Applicable Psych Involvement: No (comment)  Admission diagnosis:  Intractable pain [R52] Intractable back pain [M54.9] Closed nondisplaced fracture of posterior wall of left acetabulum, initial encounter ([place]) [S32.425A] Closed compression fracture of L4 lumbar vertebra, initial encounter ([place]) [S32.040A] Patient Active Problem List   Diagnosis Date Noted   Intractable pain 10/25/2022   Pathological fracture  10/25/2022   Atelectasis 02/10/2022   Restrictive lung disease 12/20/2021   OSA (obstructive sleep apnea) 10/05/2021   Erythropoietin deficiency anemia 09/19/2021   Iron deficiency anemia due to chronic blood loss 09/19/2021   Uncontrolled type 2 diabetes mellitus  with hyperglycemia ([place]) 06/09/2021   Chronic renal disease, stage 4, severely decreased glomerular filtration rate (GFR) between 15-29 mL/min/1.73 square meter ([place]) 06/09/2021   Closed fracture of maxillary sinus ([place]) 07/10/2019   UTI (urinary tract infection) 07/03/2019   Food poisoning 07/03/2019   Bacteremia 07/03/2019   Exposure to COVID-19 virus 06/16/2019   Closed left hip fracture ([place]) 12/24/2018   Dysuria 12/17/2017   Dizziness 12/17/2017   Urinary incontinence 11/09/2017   Tic 11/09/2017   Pulmonary hypertension ([place]) 09/12/2017   Chronic diastolic CHF (congestive heart failure) ([place]) 06/05/2017   Oral candida 05/29/2017   Vitamin D deficiency 05/29/2017   Chronic hypoxemic respiratory failure ([place]) 05/24/2017   Anxiety 05/24/2017   Right heart failure ([place]) 05/17/2017   Chronic diastolic heart failure (HCC)    Exertional dyspnea 05/13/2017   Asthma 05/13/2017   Shortness of breath 05/13/2017   Diabetes mellitus type 2, insulin dependent ([place]) 05/13/2017   Acute kidney injury ([place]) 05/13/2017   History of nephrectomy 05/13/2017   Hyperlipidemia LDL goal <70 05/13/2017   Liver cirrhosis secondary to NASH ([place]) 05/13/2017   Thrombocytopenia ([place]) 05/13/2017   Renal cell carcinoma 05/13/2017   Hypothyroidism 05/13/2017   Hypersplenism 05/13/2017   Osteoarthritis 05/13/2017   Overactive bladder 05/13/2017   Abrasion, left knee, initial encounter 02/22/2017   CTS (carpal tunnel syndrome) 03/04/2016   Bilateral contusion of ribs 10/05/2015   Dental abscess 04/15/2014   Claudication, intermittent ([place]) 12/05/2013   Obesity (BMI 30-39.9) 07/22/2013   Back pain 05/26/2013   Platelets decreased ([place]) 05/26/2013   Breast pain, right 04/21/2013   Anxiety and depression 12/05/2012   Bronchitis 11/06/2012   HEMATURIA UNSPECIFIED 02/17/2011   Hyperlipidemia 09/08/2010   B12 DEFICIENCY 07/22/2010   DIZZINESS 07/04/2010   Essential hypertension 06/21/2010   Gout,  unspecified 01/27/2010   NEPHRECTOMY, HX OF 12/02/2009   NECK PAIN, LEFT 09/04/2008   PARESTHESIA 09/02/2007   GOITER NOS 04/23/2007   Diabetes mellitus ([place]) 04/23/2007   Asthma 04/23/2007   Renal stone 04/23/2007   OSTEOPENIA 04/23/2007   CARCINOMA, RENAL CELL 08/10/1999   PCP:  Ann Held, DO Pharmacy:   [facility] [address] [place] [place] [address] Phone: [phone] Fax: [phone]     Social Determinants of Health (SDOH) Interventions    Readmission Risk Interventions     No data to display         Emeterio Reeve, LCSW Clinical Social Worker

## 2022-11-05 NOTE — Progress Notes (Signed)
Spoke with daughter [name]. [name], her sister and mom has concerns about the patient moving forward with the upcoming procedure and wants to hold off for a few days. Charge nurse and MD informed.

## 2022-11-05 NOTE — NC FL2 (Signed)
[place] LEVEL OF CARE FORM     IDENTIFICATION  Patient Name: Margaret Dyer Birthdate: December 20, 1934 Sex: female Admission Date (Current Location): 10/31/2022  [facility] and [place] Number:  Herbalist and Address:  [facility], [place] [address] [place], [place] [address]      Provider Number: [id number]  Attending Physician Name and Address:  Geradine Girt, DO  Relative Name and Phone Number:       Current Level of Care: Hospital Recommended Level of Care: [place] Prior Approval Number:    Date Approved/Denied:   PASRR Number: [id number]  Discharge Plan: SNF    Current Diagnoses: Patient Active Problem List   Diagnosis Date Noted   Intractable pain 10/25/2022   Pathological fracture 10/25/2022   Atelectasis 02/10/2022   Restrictive lung disease 12/20/2021   OSA (obstructive sleep apnea) 10/05/2021   Erythropoietin deficiency anemia 09/19/2021   Iron deficiency anemia due to chronic blood loss 09/19/2021   Uncontrolled type 2 diabetes mellitus with hyperglycemia ([place]) 06/09/2021   Chronic renal disease, stage 4, severely decreased glomerular filtration rate (GFR) between 15-29 mL/min/1.73 square meter ([place]) 06/09/2021   Closed fracture of maxillary sinus ([place]) 07/10/2019   UTI (urinary tract infection) 07/03/2019   Food poisoning 07/03/2019   Bacteremia 07/03/2019   Exposure to COVID-19 virus 06/16/2019   Closed left hip fracture ([place]) 12/24/2018   Dysuria 12/17/2017   Dizziness 12/17/2017   Urinary incontinence 11/09/2017   Tic 11/09/2017   Pulmonary hypertension ([place]) 09/12/2017   Chronic diastolic CHF (congestive heart failure) ([place]) 06/05/2017   Oral candida 05/29/2017   Vitamin D deficiency 05/29/2017   Chronic hypoxemic respiratory failure ([place]) 05/24/2017   Anxiety 05/24/2017   Right heart failure ([place]) 05/17/2017   Chronic diastolic heart failure (HCC)    Exertional dyspnea 05/13/2017    Asthma 05/13/2017   Shortness of breath 05/13/2017   Diabetes mellitus type 2, insulin dependent ([place]) 05/13/2017   Acute kidney injury ([place]) 05/13/2017   History of nephrectomy 05/13/2017   Hyperlipidemia LDL goal <70 05/13/2017   Liver cirrhosis secondary to NASH ([place]) 05/13/2017   Thrombocytopenia ([place]) 05/13/2017   Renal cell carcinoma 05/13/2017   Hypothyroidism 05/13/2017   Hypersplenism 05/13/2017   Osteoarthritis 05/13/2017   Overactive bladder 05/13/2017   Abrasion, left knee, initial encounter 02/22/2017   CTS (carpal tunnel syndrome) 03/04/2016   Bilateral contusion of ribs 10/05/2015   Dental abscess 04/15/2014   Claudication, intermittent ([place]) 12/05/2013   Obesity (BMI 30-39.9) 07/22/2013   Back pain 05/26/2013   Platelets decreased ([place]) 05/26/2013   Breast pain, right 04/21/2013   Anxiety and depression 12/05/2012   Bronchitis 11/06/2012   HEMATURIA UNSPECIFIED 02/17/2011   Hyperlipidemia 09/08/2010   B12 DEFICIENCY 07/22/2010   DIZZINESS 07/04/2010   Essential hypertension 06/21/2010   Gout, unspecified 01/27/2010   NEPHRECTOMY, HX OF 12/02/2009   NECK PAIN, LEFT 09/04/2008   PARESTHESIA 09/02/2007   GOITER NOS 04/23/2007   Diabetes mellitus ([place]) 04/23/2007   Asthma 04/23/2007   Renal stone 04/23/2007   OSTEOPENIA 04/23/2007   CARCINOMA, RENAL CELL 08/10/1999    Orientation RESPIRATION BLADDER Height & Weight     Self, Time, Situation, Place  O2 (3L) Continent Weight: 202 lb 2.6 oz (91.7 kg) Height:  5' 3" (160 cm)  BEHAVIORAL SYMPTOMS/MOOD NEUROLOGICAL BOWEL NUTRITION STATUS      Continent Diet (See DC summary)  AMBULATORY STATUS COMMUNICATION OF NEEDS Skin   Extensive Assist Verbally  Normal                       Personal Care Assistance Level of Assistance  Bathing, Feeding, Dressing Bathing Assistance: Limited assistance Feeding assistance: Limited assistance Dressing Assistance: Limited assistance     Functional Limitations Info   Sight, Hearing, Speech Sight Info: Adequate Hearing Info: Adequate Speech Info: Adequate    SPECIAL CARE FACTORS FREQUENCY  OT (By licensed OT), PT (By licensed PT)     PT Frequency: 5x a week OT Frequency: 5x a week            Contractures Contractures Info: Not present    Additional Factors Info  Code Status, Allergies Code Status Info: Partial Allergies Info: Other  Cefuroxime Axetil  Lisinopril  Bactrim (Sulfamethoxazole-trimethoprim)  Ciprofloxacin           Current Medications (11/05/2022):  This is the current hospital active medication list Current Facility-Administered Medications  Medication Dose Route Frequency Provider Last Rate Last Admin   0.9 %  sodium chloride infusion   Intravenous Continuous Boisseau, Hayley, PA       acetaminophen (TYLENOL) tablet 1,000 mg  1,000 mg Oral Q6H Crosley, Debby, MD   1,000 mg at 11/05/22 0612   bisacodyl (DULCOLAX) suppository 10 mg  10 mg Rectal Daily PRN Florencia Reasons, MD       carvedilol (COREG) tablet 12.5 mg  12.5 mg Oral BID WC Crosley, Debby, MD   12.5 mg at 11/05/22 0937   ezetimibe (ZETIA) tablet 10 mg  10 mg Oral Daily Crosley, Debby, MD   10 mg at 11/04/22 1716   And   simvastatin (ZOCOR) tablet 20 mg  20 mg Oral q1800 Crosley, Debby, MD   20 mg at 11/04/22 1716   feeding supplement (ENSURE ENLIVE / ENSURE PLUS) liquid 237 mL  237 mL Oral TID BM Florencia Reasons, MD   237 mL at 11/05/22 0947   fentaNYL (DURAGESIC) 25 MCG/HR 1 patch  1 patch Transdermal Q72H Crosley, Debby, MD   1 patch at 11/28/2022 2214   gabapentin (NEURONTIN) capsule 100 mg  100 mg Oral QHS Rosezella Rumpf, NP   100 mg at 11/04/22 2043   heparin injection 5,000 Units  5,000 Units Subcutaneous Q8H Crosley, Debby, MD   5,000 Units at 11/05/22 0612   hydrALAZINE (APRESOLINE) tablet 50 mg  50 mg Oral Q8H Florencia Reasons, MD   50 mg at 11/05/22 0612   HYDROmorphone (DILAUDID) injection 1 mg  1 mg Intravenous Q3H PRN Rosezella Rumpf, NP   1 mg at 11/02/22 0820    HYDROmorphone (DILAUDID) tablet 2 mg  2 mg Oral Q4H PRN Rosezella Rumpf, NP   2 mg at 11/05/22 0943   insulin aspart (novoLOG) injection 0-5 Units  0-5 Units Subcutaneous QHS Rai, Ripudeep K, MD       insulin aspart (novoLOG) injection 0-9 Units  0-9 Units Subcutaneous TID WC Rai, Ripudeep K, MD   2 Units at 11/05/22 0943   insulin glargine-yfgn (SEMGLEE) injection 5 Units  5 Units Subcutaneous QHS Dahal, Marlowe Aschoff, MD   5 Units at 11/04/22 2042   ipratropium-albuterol (DUONEB) 0.5-2.5 (3) MG/3ML nebulizer solution 3 mL  3 mL Nebulization BID Vann, Jessica U, DO   3 mL at 11/05/22 0908   levothyroxine (SYNTHROID) tablet 25 mcg  25 mcg Oral Q MTWThF Crosley, Debby, MD   25 mcg at 11/06/2022 0657   lidocaine (LIDODERM) 5 % 1 patch  1  patch Transdermal QHS Quintella Baton, MD   1 patch at 11/04/22 2043   LORazepam (ATIVAN) tablet 0.5 mg  0.5 mg Oral Daily PRN Rosezella Rumpf, NP   0.5 mg at 11/05/22 0233   meropenem (MERREM) 500 mg in sodium chloride 0.9 % 100 mL IVPB  500 mg Intravenous BID Volanda Napoleon, MD 200 mL/hr at 11/05/22 0936 500 mg at 11/05/22 0936   multivitamin with minerals tablet 1 tablet  1 tablet Oral Daily Florencia Reasons, MD   1 tablet at 11/05/22 0937   ondansetron (ZOFRAN) tablet 4 mg  4 mg Oral Q8H PRN Florencia Reasons, MD       oxybutynin (DITROPAN) tablet 5 mg  5 mg Oral Daily Crosley, Debby, MD   5 mg at 11/05/22 4492   senna-docusate (Senokot-S) tablet 1 tablet  1 tablet Oral QHS PRN Quintella Baton, MD       torsemide (DEMADEX) tablet 20 mg  20 mg Oral Daily Eulogio Bear U, DO   20 mg at 11/05/22 0100     Discharge Medications: Please see discharge summary for a list of discharge medications.  Relevant Imaging Results:  Relevant Lab Results:   Additional Information SSN: [id number]  Emeterio Reeve, [place]

## 2022-11-05 NOTE — Progress Notes (Signed)
PROGRESS NOTE  Margaret Dyer  DOB: 1935-07-09  PCP: Ann Held, DO ZOX:[id number]  DOA: 10/23/2022  LOS: 11 days  Hospital Day: 13  Brief narrative: Margaret Dyer is a 87 y.o. female with PMH significant for DM2, HTN, HLD, chronic diastolic CHF, pulmonary hypertension, liver cirrhosis, CKD 3, renal cell carcinoma with left nephrectomy 2000, hypothyroidism. 11/21, patient presented to the ED with complaint of intractable back pain after a fall CT scan showed acute compression fracture of L4 vertebral body.  It also showed innumerable bilateral pulmonary metastases, posterior left acetabular wall fracture of indeterminate age. MRI confirmed the findings. Admitted to hospitalist service Consultation was obtained from radiology, oncology -IR procedure postponed due to patient not being able to lay on abdomen.   -defer to Dr. Marin Olp timing of kyphoplasty  Subjective: Daughter is sick and would like to be able to see the patient prior to her procedure  Patient without complaints   Assessment and plan: Intractable back pain with compression fracture of L4 with concern for pathological fracture and metastasis MRI, CT of the pelvis showed compression fracture of L4, pathological with 30% vertebral height loss Interventional radiology consulted for vertebroplasty/ablation and bone biopsy. Aspirin on hold.   -retry kyphoplasy when ok with hematology Continue pain management.  PNA -pro calcitonin elevated but no fever, no WBC -x ray suggestive of infiltrate -IV abx started 12/1- xray with stability and improvement in some areas  Innumerable bilateral pulmonary metastasis History of renal cell CA with left nephrectomy CT scan obtained on admission showed innumerable bilateral pulmonary metastases as well as possible liver mets and bone mets.  Oncology consult appreciated- suspected metastatic renal cell carcinoma 11/27, underwent liver biopsy .  Pending report. -may need  radiation  Posterior left acetabular wall fracture of indeterminate age CT of the left hip showed small fracture of posterior acetabular wall of indeterminate age 69/22, seen by [facility], recommended PWB with assistance of a walker, Follow in office as outpt 3 weeks for repeat xrays   Klebsiella UTI Patient was recently diagnosed with UTI, urine culture showing Klebsiella.   She has only taken 2 days of oral antibiotics, Augmentin, then 3 days of iv rocephin in the hospital, last dose on 11/24.   CKD stage IV history of renal cell carcinoma with left  nephrectomy Baseline creatinine close to 1.8.  Continue to monitor.    Type 2 diabetes mellitus with hypoglycemia A1c 6.4 on 10/25/2022 PTA on Lantus 12 units nightly. Currently blood sugar control as below on Lantus 5 units nightly.  COPD /Chronic hypoxic respiratory failure  on home O2 3.5liters -schedule nebs -will give dose IV lasix and monitor for response  Essential hypertension Blood pressure currently stable. PTA on carvedilol 12.5 mg twice daily, hydralazine 100 mg 3 times daily Continue both.  HLD Zetia, statin  Hypothyroidism On Synthroid 25 mcg daily on weekdays. Continue the same.   Thrombocytopenia, chronic Does has ecchymosis ,but no overt bleeding Management per hematology oncology Dr. Marin Olp  Failure to thrive Secondary to cancer.  Palliative consult appreciated.  Obesity Estimated body mass index is 35.81 kg/m as calculated from the following:   Height as of this encounter: _0  (1.6 m).   Weight as of this encounter: 91.7 kg.    Code Status: Partial Code    Mobility: Encourage ambulation.  PT eval  Infusions:   sodium chloride     meropenem (MERREM) IV 500 mg (11/05/22 0936)    Scheduled Meds:  acetaminophen  1,000 mg Oral Q6H   carvedilol  12.5 mg Oral BID WC   ezetimibe  10 mg Oral Daily   And   simvastatin  20 mg Oral q1800   feeding supplement  237 mL Oral TID BM    fentaNYL  1 patch Transdermal Q72H   gabapentin  100 mg Oral QHS   heparin  5,000 Units Subcutaneous Q8H   hydrALAZINE  50 mg Oral Q8H   insulin aspart  0-5 Units Subcutaneous QHS   insulin aspart  0-9 Units Subcutaneous TID WC   insulin glargine-yfgn  5 Units Subcutaneous QHS   ipratropium-albuterol  3 mL Nebulization BID   levothyroxine  25 mcg Oral Q MTWThF   lidocaine  1 patch Transdermal QHS   multivitamin with minerals  1 tablet Oral Daily   oxybutynin  5 mg Oral Daily   torsemide  20 mg Oral Daily    PRN meds: bisacodyl, HYDROmorphone (DILAUDID) injection, HYDROmorphone, LORazepam, ondansetron, senna-docusate     Nutritional status:  Body mass index is 35.81 kg/m.  Nutrition Problem: Increased nutrient needs Etiology: acute illness, chronic illness Signs/Symptoms: estimated needs   DVT prophylaxis:  heparin injection 5,000 Units Start: 10/25/22 1400 SCDs Start: 10/25/22 0220   Consultants: Oncology, IR Family Communication: called daughter  Status is: Inpatient  Continue inhospital care because: Pending kyphoplasty Level of care: Med-Surg   Dispo: The patient is from: Home              Anticipated d/c is to: Pending clinical course              Patient currently is not medically stable to d/c.   Difficult to place patient No      Objective: Vitals:   11/05/22 0850 11/05/22 0908  BP: (!) 118/52   Pulse: 68   Resp: 19   Temp: 98.8 F (37.1 C)   SpO2: 98% 95%    Intake/Output Summary (Last 24 hours) at 11/05/2022 1243 Last data filed at 11/05/2022 0900 Gross per 24 hour  Intake 950.06 ml  Output 200 ml  Net 750.06 ml   Filed Weights   10/19/2022 1457 12/03/2022 1635 11/05/22 0700  Weight: 83 kg 85.8 kg 91.7 kg   Weight change: 5.9 kg Body mass index is 35.81 kg/m.   Physical Exam:      General: Appearance:    Obese female in no acute distress     Lungs:     respirations unlabored, diminished, on 3L [place]  Heart:    Normal heart rate.    MS:   All extremities are intact.   Neurologic:   Awake, alert           Data Review: I have personally reviewed the laboratory data and studies available.   Signed, Geradine Girt, DO [facility] 11/05/2022

## 2022-11-06 DIAGNOSIS — M8000XD Age-related osteoporosis with current pathological fracture, unspecified site, subsequent encounter for fracture with routine healing: Secondary | ICD-10-CM | POA: Diagnosis not present

## 2022-11-06 DIAGNOSIS — R52 Pain, unspecified: Secondary | ICD-10-CM | POA: Diagnosis not present

## 2022-11-06 DIAGNOSIS — Z515 Encounter for palliative care: Secondary | ICD-10-CM | POA: Diagnosis not present

## 2022-11-06 DIAGNOSIS — Z7189 Other specified counseling: Secondary | ICD-10-CM | POA: Diagnosis not present

## 2022-11-06 LAB — CBC WITH DIFFERENTIAL/PLATELET
Abs Immature Granulocytes: 0.03 10*3/uL (ref 0.00–0.07)
Basophils Absolute: 0 10*3/uL (ref 0.0–0.1)
Basophils Relative: 1 %
Eosinophils Absolute: 0.3 10*3/uL (ref 0.0–0.5)
Eosinophils Relative: 5 %
HCT: 34.2 % — ABNORMAL LOW (ref 36.0–46.0)
Hemoglobin: 10.8 g/dL — ABNORMAL LOW (ref 12.0–15.0)
Immature Granulocytes: 1 %
Lymphocytes Relative: 20 %
Lymphs Abs: 1 10*3/uL (ref 0.7–4.0)
MCH: 29.6 pg (ref 26.0–34.0)
MCHC: 31.6 g/dL (ref 30.0–36.0)
MCV: 93.7 fL (ref 80.0–100.0)
Monocytes Absolute: 0.7 10*3/uL (ref 0.1–1.0)
Monocytes Relative: 13 %
Neutro Abs: 3 10*3/uL (ref 1.7–7.7)
Neutrophils Relative %: 60 %
Platelets: 97 10*3/uL — ABNORMAL LOW (ref 150–400)
RBC: 3.65 MIL/uL — ABNORMAL LOW (ref 3.87–5.11)
RDW: 16.8 % — ABNORMAL HIGH (ref 11.5–15.5)
WBC: 4.9 10*3/uL (ref 4.0–10.5)
nRBC: 0 % (ref 0.0–0.2)

## 2022-11-06 LAB — RESPIRATORY PANEL BY PCR

## 2022-11-06 LAB — BASIC METABOLIC PANEL
Anion gap: 7 (ref 5–15)
BUN: 39 mg/dL — ABNORMAL HIGH (ref 8–23)
CO2: 21 mmol/L — ABNORMAL LOW (ref 22–32)
Calcium: 6.6 mg/dL — ABNORMAL LOW (ref 8.9–10.3)
Chloride: 109 mmol/L (ref 98–111)
Creatinine, Ser: 1.92 mg/dL — ABNORMAL HIGH (ref 0.44–1.00)
GFR, Estimated: 25 mL/min — ABNORMAL LOW (ref >60)
Glucose, Bld: 111 mg/dL — ABNORMAL HIGH (ref 70–99)
Potassium: 5 mmol/L (ref 3.5–5.1)
Sodium: 137 mmol/L (ref 135–145)

## 2022-11-06 LAB — GLUCOSE, CAPILLARY
Glucose-Capillary: 124 mg/dL — ABNORMAL HIGH (ref 70–99)
Glucose-Capillary: 245 mg/dL — ABNORMAL HIGH (ref 70–99)
Glucose-Capillary: 329 mg/dL — ABNORMAL HIGH (ref 70–99)
Glucose-Capillary: 294 mg/dL — ABNORMAL HIGH (ref 70–99)

## 2022-11-06 LAB — PROCALCITONIN: Procalcitonin: 0.35 ng/mL

## 2022-11-06 MED ORDER — ALBUTEROL SULFATE (2.5 MG/3ML) 0.083% IN NEBU
2.5000 mg | INHALATION_SOLUTION | RESPIRATORY_TRACT | Status: DC | PRN
  Administered 2022-11-06 – 2022-11-07 (×2): 2.5 mg via RESPIRATORY_TRACT
  Filled 2022-11-06 (×2): qty 3

## 2022-11-06 MED ORDER — TORSEMIDE 20 MG PO TABS
20.0000 mg | ORAL_TABLET | ORAL | Status: DC
  Administered 2022-11-08: 20 mg via ORAL
  Filled 2022-11-06: qty 1

## 2022-11-06 MED ORDER — METHYLPREDNISOLONE SODIUM SUCC 125 MG IJ SOLR
120.0000 mg | Freq: Two times a day (BID) | INTRAMUSCULAR | Status: DC
  Administered 2022-11-06 – 2022-11-09 (×7): 120 mg via INTRAVENOUS
  Filled 2022-11-06 (×7): qty 2

## 2022-11-06 NOTE — Plan of Care (Signed)
  Problem: Health Behavior/Discharge Planning: Goal: Ability to manage health-related needs will improve Outcome: Progressing   Problem: Nutritional: Goal: Maintenance of adequate nutrition will improve Outcome: Progressing   Problem: Skin Integrity: Goal: Risk for impaired skin integrity will decrease Outcome: Progressing

## 2022-11-06 NOTE — Progress Notes (Signed)
Palliative: Face-to-face conference with bedside nursing staff. [name] is resting quietly in bed.  She appears acutely/chronically ill and frail.  Unfortunately, she seems to be weaker than when I saw her a few days ago.  She is alert and oriented, able to make her needs known.  Her grandson is present at bedside.  We talked about her visit with Dr. Marin Olp this morning, the continuing need for tissue samples for diagnostic purposes.  [name] continues to have difficulty in breathing.  We do discussed the possibility of lung tissue biopsy.  We talk about the benefits of kyphoplasty and radiation therapy for pain management of her spinal issues.  [name] continues to tell me that she would not want chemotherapy, only radiation therapy.  It seems that she would likely benefit from some rehab.  I worry that the longer she is hospitalized, the weaker she becomes.  Conference with attending, oncology, bedside nursing staff, transition of care team related to patient condition, needs, goals of care, disposition.  Plan: At this point continue to treat the treatable, DNI.  [name] shares that she would not want chemotherapy but is open to radiation therapy.  Awaiting diagnostic tissue sample.  Anticipate need for short-term rehab.  35 minutes Margaret Axe, NP Palliative medicine team Team phone [phone] Greater than 50% of this time was spent counseling and coordinating care related to the above assessment and plan.

## 2022-11-06 NOTE — Progress Notes (Signed)
Mobility Specialist Progress Note   11/06/22 1200  Pain Assessment  Pain Assessment Faces  Faces Pain Scale 8  Pain Location Back  Pain Descriptors / Indicators Discomfort;Shooting;Sharp  Pain Intervention(s) Limited activity within patient's tolerance  Mobility  Activity Transferred to/from [facility] (in recliner before and after)  Level of Assistance Minimal assist, patient does 75% or more  Assistive Device Front wheel walker;BSC  Distance Ambulated (ft) 5 ft  Range of Motion/Exercises Active;All extremities  LLE Weight Bearing PWB  Activity Response Tolerated fair   Patient received in recliner requesting assistance to [facility]. Required min A to stand + cues for hand placement. Upon standing, patient with trunk flexion leaning anteriorly over walker requiring cues to stand upright, maintain PWB status and proper hand placement. Complained of 8/10 back pain that increased with taking steps. Transferred to and from [facility] with min guard, deferred further ambulation secondary to severe back pain. Returned to recliner without incident and was left with all needs met, call bell in reach.   [place] Cohan Stipes, BS EXP Mobility Specialist Please contact via SecureChat or Rehab office at [phone]

## 2022-11-06 NOTE — Progress Notes (Signed)
She still feels a little short of breath.  She had a chest x-ray on think on Saturday which looked about the same.  She has bilateral infiltrates..  Maybe, I will try her on some Solu-Medrol to see if this may help.  She currently is on Merrem.  I still do not think she is can be able to have the kyphoplasty at L4.  I think if we are going to make a diagnosis more quickly, we will have to see about getting a lung biopsy.  I realize this is invasive.  However, we really need to know what is going on.  She has numerous lung nodules.  These are large.  A lot of these are peripheral.  I would think that a biopsy should be able to be done that is successful.  Her labs show white cell count 4.9.  Hemoglobin 10.8.  Platelet count 97,000.  Her BUN is 39 creatinine 1.92.  Her appetite is okay.  She really needs to try to walk.  She has a back brace that she can use.  I know that she sits up in the chair.  This is also helping her.  She does have the incentive spirometer which she uses.  There has been no issues with bleeding.  She has had no diarrhea.  There is been no nausea or vomiting.  Her vital signs show temperature of 98.2.  Pulse 62.  Blood pressure 114/41.  Her lungs sound pretty clear bilaterally.  She has good air movement bilaterally.  I really cannot hear much in the way of wheezing.  Cardiac exam regular rate and rhythm.  She has no murmurs.  Abdomen is soft.  She has good bowel sounds.  There is no fluid wave.  There is no palpable liver or spleen tip.  Extremity shows no clubbing, cyanosis or edema.  Neurological exam is nonfocal.  Again, we still do not have a diagnosis.  I sent off another alpha-fetoprotein.  The first 1 that I sent off about 2 weeks ago still not back yet.  I am sure that got lost somewhere.  We need to have a tissue diagnosis.  Maybe, IR will be able to do a lung biopsy.  I still do not think that she is clearly able to have the kyphoplasty right now.  She is very  anxious about having to be on her stomach.  She is not sure she can do this.  I will start her on some Solu-Medrol to see if this may help with some of the shortness of breath.  I do appreciate the incredible care that she is getting from all the staff upon [address].    [name], MD  Oswaldo Milian 9:6

## 2022-11-06 NOTE — Progress Notes (Signed)
PROGRESS NOTE  Margaret Dyer  DOB: Sep 30, 1935  PCP: Ann Held, DO UXN:[id number]  DOA: 10/17/2022  LOS: 12 days  Hospital Day: 14  Brief narrative: Margaret Dyer is a 87 y.o. female with PMH significant for DM2, HTN, HLD, chronic diastolic CHF, pulmonary hypertension, liver cirrhosis, CKD 3, renal cell carcinoma with left nephrectomy 2000, hypothyroidism. 11/21, patient presented to the ED with complaint of intractable back pain after a fall CT scan showed acute compression fracture of L4 vertebral body.  It also showed innumerable bilateral pulmonary metastases, posterior left acetabular wall fracture of indeterminate age. MRI confirmed the findings. Admitted to hospitalist service Consultation was obtained from radiology, oncology -IR procedure postponed due to patient not being able to lay on abdomen.   -defer to Dr. Marin Olp timing of kyphoplasty since unable to do lung biopsy either due to positioning issues  Subjective: No complaints from patient   Assessment and plan: Intractable back pain with compression fracture of L4 with concern for pathological fracture and metastasis MRI, CT of the pelvis showed compression fracture of L4, pathological with 30% vertebral height loss Interventional radiology consulted for vertebroplasty/ablation and bone biopsy. Aspirin on hold.   -retry kyphoplasy when ok with hematology Continue pain management.  PNA -pro calcitonin elevated but no fever, no WBC -x ray suggestive of infiltrate -IV abx started 12/1- xray with stability and improvement in some areas NP swab negative for  Innumerable bilateral pulmonary metastasis History of renal cell CA with left nephrectomy CT scan obtained on admission showed innumerable bilateral pulmonary metastases as well as possible liver mets and bone mets.  Oncology consult appreciated- suspected metastatic renal cell carcinoma 11/27, underwent liver biopsy .  Pending report. -may need  radiation  Posterior left acetabular wall fracture of indeterminate age CT of the left hip showed small fracture of posterior acetabular wall of indeterminate age 68/22, seen by [facility], recommended PWB with assistance of a walker, Follow in office as outpt 3 weeks for repeat xrays   Klebsiella UTI Patient was recently diagnosed with UTI, urine culture showing Klebsiella.   She has only taken 2 days of oral antibiotics, Augmentin, then 3 days of iv rocephin in the hospital, last dose on 11/24.   CKD stage IV history of renal cell carcinoma with left  nephrectomy Baseline creatinine close to 1.8.  Continue to monitor- especially while restarting demadex QOD  Type 2 diabetes mellitus with hypoglycemia A1c 6.4 on 10/25/2022 SSI  COPD /Chronic hypoxic respiratory failure  on home O2 3.5liters -schedule nebs  Essential hypertension Blood pressure currently stable. PTA on carvedilol 12.5 mg twice daily, hydralazine 100 mg 3 times daily Continue both.  HLD Zetia, statin  Hypothyroidism On Synthroid 25 mcg daily on weekdays. Continue the same.   Thrombocytopenia, chronic Does has ecchymosis ,but no overt bleeding Management per hematology oncology Dr. Marin Olp  Failure to thrive Secondary to cancer.  Palliative consult appreciated.  Obesity Estimated body mass index is 35.81 kg/m as calculated from the following:   Height as of this encounter: _0  (1.6 m).   Weight as of this encounter: 91.7 kg.    Code Status: Partial Code    Mobility: Encourage ambulation.  PT eval  Infusions:   sodium chloride     meropenem (MERREM) IV 500 mg (11/06/22 0947)    Scheduled Meds:  acetaminophen  1,000 mg Oral Q6H   carvedilol  12.5 mg Oral BID WC   ezetimibe  10 mg Oral Daily  And   simvastatin  20 mg Oral q1800   feeding supplement  237 mL Oral TID BM   fentaNYL  1 patch Transdermal Q72H   gabapentin  100 mg Oral QHS   heparin  5,000 Units Subcutaneous Q8H    hydrALAZINE  50 mg Oral Q8H   insulin aspart  0-5 Units Subcutaneous QHS   insulin aspart  0-9 Units Subcutaneous TID WC   insulin glargine-yfgn  5 Units Subcutaneous QHS   ipratropium-albuterol  3 mL Nebulization BID   levothyroxine  25 mcg Oral Q MTWThF   lidocaine  1 patch Transdermal QHS   methylPREDNISolone (SOLU-MEDROL) injection  120 mg Intravenous Q12H   multivitamin with minerals  1 tablet Oral Daily   oxybutynin  5 mg Oral Daily   senna-docusate  1 tablet Oral QHS   [START ON 11/08/2022] torsemide  20 mg Oral QODAY    PRN meds: bisacodyl, HYDROmorphone (DILAUDID) injection, HYDROmorphone, LORazepam, ondansetron     Nutritional status:  Body mass index is 35.81 kg/m.  Nutrition Problem: Increased nutrient needs Etiology: acute illness, chronic illness Signs/Symptoms: estimated needs   DVT prophylaxis:  heparin injection 5,000 Units Start: 10/25/22 1400 SCDs Start: 10/25/22 0220   Consultants: Oncology, IR Family Communication: called daughter  Status is: Inpatient  Continue inhospital care because: Pending kyphoplasty Level of care: Med-Surg   Dispo: The patient is from: Home              Anticipated d/c is to: Pending clinical course- SNF probably once biopsy done              Patient currently is not medically stable to d/c.   Difficult to place patient No      Objective: Vitals:   11/06/22 0922 11/06/22 0947  BP:  (!) 120/45  Pulse: 67 65  Resp: 15   Temp:    SpO2: 99%     Intake/Output Summary (Last 24 hours) at 11/06/2022 1316 Last data filed at 11/05/2022 1800 Gross per 24 hour  Intake 960 ml  Output 350 ml  Net 610 ml   Filed Weights   10/07/2022 1457 11/12/2022 1635 11/05/22 0700  Weight: 83 kg 85.8 kg 91.7 kg   Weight change:  Body mass index is 35.81 kg/m.   Physical Exam:    General: Appearance:    Obese female in no acute distress     Lungs:     Clear but diminished, respirations unlabored  Heart:    Normal heart rate.    MS:   All extremities are intact.   Neurologic:   Awake, alert           Data Review: I have personally reviewed the laboratory data and studies available.   Signed, Geradine Girt, DO [facility] 11/06/2022

## 2022-11-06 NOTE — TOC Progression Note (Signed)
Transition of Care [facility]) - Progression Note    Patient Details  Name: Margaret Dyer MRN: [id number] Date of Birth: May 12, 1935  [facility] CM/SW Contact  Joanne Chars, LCSW Phone Number: 11/06/2022, 11:02 AM  Clinical Narrative:   [facility] does make bed offer, CSW spoke with pt and she wants to accept this offer.  Per MD, unclear on DC date.  [facility] informed.      Expected Discharge Plan: [place] Barriers to Discharge: Continued Medical Work up, Ship broker, SNF Pending bed offer  Expected Discharge Plan and Services Expected Discharge Plan: [place] arrangements for the past 2 months: Single Family Home                                       Social Determinants of Health (SDOH) Interventions    Readmission Risk Interventions     No data to display

## 2022-11-06 NOTE — Progress Notes (Signed)
CSW met with pt regarding SDOH transportation.  Pt reports she lives with her daughter but the daughter is unable to assist with transportation to routine MD appts.  She has been paying a friend $50 to take her.  She is on a fixed income and cannot afford this.  Pt agreeable to referral to [facility] [employer].  Referral made through [facility] care 360 and contact information also placed on pt AVS. Lurline Idol, MSW, LCSW 12/4/202311:41 AM

## 2022-11-06 NOTE — Progress Notes (Signed)
IR was asked to evaluate patient for image-guided lung biopsy. The case was reviewed with Dr Kathlene Cote. It was felt that while a posterior lung nodule is able to be targeted, the patient would not be able to tolerate the prone positioning required for the procedure. For this reason, the case cannot be performed at this time. The current plan would be to perform biopsy once the patient is stable for previously requested kyphoplasty and Osteocool procedure. Please contact IR team with any questions or concerns.   Lura Em, PA-C 11/06/2022 9:25 AM

## 2022-11-07 ENCOUNTER — Inpatient Hospital Stay (HOSPITAL_COMMUNITY): Payer: Medicare Other

## 2022-11-07 DIAGNOSIS — J189 Pneumonia, unspecified organism: Secondary | ICD-10-CM

## 2022-11-07 DIAGNOSIS — K7581 Nonalcoholic steatohepatitis (NASH): Secondary | ICD-10-CM

## 2022-11-07 DIAGNOSIS — Z789 Other specified health status: Secondary | ICD-10-CM | POA: Diagnosis not present

## 2022-11-07 DIAGNOSIS — R7881 Bacteremia: Secondary | ICD-10-CM

## 2022-11-07 DIAGNOSIS — N184 Chronic kidney disease, stage 4 (severe): Secondary | ICD-10-CM

## 2022-11-07 DIAGNOSIS — D731 Hypersplenism: Secondary | ICD-10-CM

## 2022-11-07 DIAGNOSIS — R52 Pain, unspecified: Secondary | ICD-10-CM | POA: Diagnosis not present

## 2022-11-07 DIAGNOSIS — S32040A Wedge compression fracture of fourth lumbar vertebra, initial encounter for closed fracture: Secondary | ICD-10-CM | POA: Diagnosis not present

## 2022-11-07 DIAGNOSIS — D696 Thrombocytopenia, unspecified: Secondary | ICD-10-CM

## 2022-11-07 DIAGNOSIS — Z515 Encounter for palliative care: Secondary | ICD-10-CM | POA: Diagnosis not present

## 2022-11-07 DIAGNOSIS — Y95 Nosocomial condition: Secondary | ICD-10-CM

## 2022-11-07 DIAGNOSIS — Z7189 Other specified counseling: Secondary | ICD-10-CM | POA: Diagnosis not present

## 2022-11-07 DIAGNOSIS — I272 Pulmonary hypertension, unspecified: Secondary | ICD-10-CM

## 2022-11-07 LAB — COMPREHENSIVE METABOLIC PANEL
ALT: 12 U/L (ref 0–44)
AST: 29 U/L (ref 15–41)
Albumin: 2.6 g/dL — ABNORMAL LOW (ref 3.5–5.0)
Alkaline Phosphatase: 122 U/L (ref 38–126)
Anion gap: 6 (ref 5–15)
BUN: 42 mg/dL — ABNORMAL HIGH (ref 8–23)
CO2: 23 mmol/L (ref 22–32)
Calcium: 6.8 mg/dL — ABNORMAL LOW (ref 8.9–10.3)
Chloride: 108 mmol/L (ref 98–111)
Creatinine, Ser: 1.95 mg/dL — ABNORMAL HIGH (ref 0.44–1.00)
GFR, Estimated: 25 mL/min — ABNORMAL LOW (ref >60)
Glucose, Bld: 232 mg/dL — ABNORMAL HIGH (ref 70–99)
Potassium: 5.4 mmol/L — ABNORMAL HIGH (ref 3.5–5.1)
Sodium: 137 mmol/L (ref 135–145)
Total Bilirubin: 0.6 mg/dL (ref 0.3–1.2)
Total Protein: 5.8 g/dL — ABNORMAL LOW (ref 6.5–8.1)

## 2022-11-07 LAB — CBC WITH DIFFERENTIAL/PLATELET
Abs Immature Granulocytes: 0.02 10*3/uL (ref 0.00–0.07)
Basophils Absolute: 0 10*3/uL (ref 0.0–0.1)
Basophils Relative: 0 %
Eosinophils Absolute: 0 10*3/uL (ref 0.0–0.5)
Eosinophils Relative: 0 %
HCT: 38.8 % (ref 36.0–46.0)
Hemoglobin: 11.9 g/dL — ABNORMAL LOW (ref 12.0–15.0)
Immature Granulocytes: 1 %
Lymphocytes Relative: 14 %
Lymphs Abs: 0.5 10*3/uL — ABNORMAL LOW (ref 0.7–4.0)
MCH: 28.9 pg (ref 26.0–34.0)
MCHC: 30.7 g/dL (ref 30.0–36.0)
MCV: 94.2 fL (ref 80.0–100.0)
Monocytes Absolute: 0.1 10*3/uL (ref 0.1–1.0)
Monocytes Relative: 2 %
Neutro Abs: 2.9 10*3/uL (ref 1.7–7.7)
Neutrophils Relative %: 83 %
Platelets: 107 10*3/uL — ABNORMAL LOW (ref 150–400)
RBC: 4.12 MIL/uL (ref 3.87–5.11)
RDW: 16.7 % — ABNORMAL HIGH (ref 11.5–15.5)
WBC: 3.4 10*3/uL — ABNORMAL LOW (ref 4.0–10.5)
nRBC: 0 % (ref 0.0–0.2)

## 2022-11-07 LAB — GLUCOSE, CAPILLARY
Glucose-Capillary: 236 mg/dL — ABNORMAL HIGH (ref 70–99)
Glucose-Capillary: 272 mg/dL — ABNORMAL HIGH (ref 70–99)
Glucose-Capillary: 262 mg/dL — ABNORMAL HIGH (ref 70–99)
Glucose-Capillary: 203 mg/dL — ABNORMAL HIGH (ref 70–99)

## 2022-11-07 LAB — AFP TUMOR MARKER: AFP, Serum, Tumor Marker: 81895 ng/mL — ABNORMAL HIGH (ref 0.0–8.7)

## 2022-11-07 MED ORDER — SODIUM ZIRCONIUM CYCLOSILICATE 5 G PO PACK
5.0000 g | PACK | Freq: Once | ORAL | Status: AC
  Administered 2022-11-07: 5 g via ORAL
  Filled 2022-11-07: qty 1

## 2022-11-07 MED ORDER — LIP MEDEX EX OINT
1.0000 | TOPICAL_OINTMENT | CUTANEOUS | Status: DC | PRN
  Administered 2022-11-07: 1 via TOPICAL
  Filled 2022-11-07 (×2): qty 7

## 2022-11-07 MED ORDER — LORAZEPAM 0.5 MG PO TABS
0.5000 mg | ORAL_TABLET | Freq: Once | ORAL | Status: AC | PRN
  Administered 2022-11-07: 0.5 mg via ORAL
  Filled 2022-11-07: qty 1

## 2022-11-07 NOTE — Progress Notes (Signed)
Report given to  [facility] at [facility]. All questions and concerns were fully addressed.

## 2022-11-07 NOTE — Progress Notes (Signed)
Mobility Specialist Progress Note   11/07/22 1449  Mobility  Activity Dangled on edge of bed  Level of Assistance Minimal assist, patient does 75% or more  Assistive Device Other (Comment) (HHA)  LLE Weight Bearing PWB  Activity Response Tolerated well  $Mobility charge 1 Mobility   Received pt in bed having unspecified back pain and agreeable after encouragement. MinA to get LE's to EOB while adhering to back precautions. Session cut short d/t transport taking pt down for x-ray. Will f/u later this afternoon if time allows.     Holland Falling Mobility Specialist Please contact via SecureChat or  Rehab office at [phone]

## 2022-11-07 NOTE — Progress Notes (Signed)
Inpatient Diabetes Program Recommendations  AACE/ADA: New Consensus Statement on Inpatient Glycemic Control (2015)  Target Ranges:  Prepandial:   less than 140 mg/dL      Peak postprandial:   less than 180 mg/dL (1-2 hours)      Critically ill patients:  140 - 180 mg/dL   Lab Results  Component Value Date   GLUCAP 272 (H) 11/07/2022   HGBA1C 6.4 (H) 10/25/2022    Review of Glycemic Control  Latest Reference Range & Units 11/06/22 13:41 11/06/22 16:44 11/06/22 21:08 11/07/22 08:08 11/07/22 11:57  Glucose-Capillary 70 - 99 mg/dL 245 (H) 329 (H) 294 (H) 236 (H) 272 (H)   Diabetes history: DM  Outpatient Diabetes medications:  Lantus 12 units daily, Apidra 1-2 units tid with meals Current orders for Inpatient glycemic control:  Novolog 0-9 units tid with meals and HS Semglee 5 units daily Solumedrol 120 mg q 12 hours  Inpatient Diabetes Program Recommendations:    May consider increasing Semglee to 10 units daily.  Also consider adding Novolog meal coverage 2 units tid with meals (hold if patient eats less than 50% or NPO).    Thanks,  Adah Perl, RN, BC-ADM Inpatient Diabetes Coordinator Pager [phone]  (8a-5p)

## 2022-11-07 NOTE — Progress Notes (Signed)
Daily Progress Note   Patient Name: Margaret Dyer       Date: 11/07/2022 DOB: 03/11/1935  Age: 87 y.o. MRN#: [id number] Attending Physician: Geradine Girt, DO Primary Care Physician: Carollee Herter, Alferd Apa, DO Admit Date: 10/09/2022 Length of Stay: 13 days  Reason for Consultation/Follow-up: Establishing goals of care  HPI/Patient Profile:  TAMESHA ELLERBROCK is a 87 y.o. female with PMH significant for DM2, HTN, HLD, chronic diastolic CHF, pulmonary hypertension, liver cirrhosis, CKD 3, renal cell carcinoma with left nephrectomy 2000, hypothyroidism. On 11/21, patient presented to the ED with complaint of intractable back pain after a fall. CT scan showed acute compression fracture of L4 vertebral body.  It also showed innumerable bilateral pulmonary metastases, posterior left acetabular wall fracture of indeterminate age. MRI confirmed the findings.  PMT consulted for [place] conversations.  Subjective:   Subjective: Chart Reviewed. Updates received. Patient Assessed. Created space and opportunity for patient  and family to explore thoughts and feelings regarding current medical situation.  Today's Discussion: Today I met with the patient at the bedside, she was sitting in the bedside chair.  She states she feels about the same.  She ate about 50% of her breakfast.  We discussed the conversation that Dr. Marin Olp with oncology had with her.  She states that he wants to transfer to [facility] and she is okay with this.  However, she emphasized multiple times during our conversation that she does not want to "go back and forth" between [facility] and [facility].  I informed her that they would only transfer her back to [facility] if there is something that she needed that they could not provide at [facility].  Being that the intent is cancer treatment [facility] is the best place for her.  I confirmed her previously stated wishes for no chemotherapy.  She is amendable to radiation.  Per  oncology notes possible consideration further treatment options such as immunotherapy.  Obviously her cancer treatment will be driven by the oncologist after discussions with the patient.  We also discussed tissue biopsy and kyphoplasty issue.  At this time she does not appear to be a candidate for either because of her inability to lay prone for the amount of time needed.  She understands this and agrees that she could not lay on her stomach for very long.  I stated that kyphoplasty could be an option in the coming days or weeks if her ability to lie prone improved.  She did share her concern that her daughter is sick and is going today for COVID testing.  She states it is "some virus" but she is not sure what is going on.  Also she notes her other daughter has a stomach virus.  Because of this she is going to have her daughters roommate come get her flowers to bring back to her house.  I confirmed with the patient's nurse that a transfer order to [facility] has been ordered.  I provided emotional and general support through therapeutic listening, empathy, sharing of stories, therapeutic touch, and other techniques. I answered all questions and addressed all concerns to the best of my ability.  Review of Systems  Constitutional:  Positive for fatigue.       Feels about the same, pain tolerable with current pain medications  Respiratory:  Negative for shortness of breath.   Cardiovascular:  Negative for chest pain.    Objective:   Vital Signs:  BP Marland Kitchen)  118/54 (BP Location: Left Arm)   Pulse 65   Temp (!) 97.5 F (36.4 C) (Oral)   Resp 16   Ht _0  (1.6 m)   Wt 96.2 kg Comment: bed weight  LMP  (LMP Unknown)   SpO2 96%   BMI 37.57 kg/m   Physical Exam: Physical Exam Vitals and nursing note reviewed.  Constitutional:      General: She is not in acute distress.    Appearance: She is ill-appearing.  HENT:     Head: Normocephalic and atraumatic.  Pulmonary:      Effort: Pulmonary effort is normal. No respiratory distress.  Abdominal:     General: Abdomen is flat.     Palpations: Abdomen is soft.  Skin:    General: Skin is warm and dry.  Neurological:     General: No focal deficit present.     Mental Status: She is alert.  Psychiatric:        Mood and Affect: Mood normal.        Behavior: Behavior normal.     Palliative Assessment/Data: 50%    Existing Vynca/ACP Documentation: None  Assessment & Plan:   Impression: Present on Admission:  Intractable pain  Pathological fracture  Chronic diastolic CHF (congestive heart failure) (HCC)  Chronic renal disease, stage 4, severely decreased glomerular filtration rate (GFR) between 15-29 mL/min/1.73 square meter (HCC)  Hyperlipidemia  Gout, unspecified  Essential hypertension  Asthma  Anxiety and depression  Platelets decreased (HCC)  Liver cirrhosis secondary to NASH (HCC)  Thrombocytopenia (HCC)  Hypothyroidism  Chronic hypoxemic respiratory failure (HCC)  Pulmonary hypertension (HCC)  OSA (obstructive sleep apnea)  SUMMARY OF RECOMMENDATIONS   Remain DNI Confirmed patient agreeable to transfer to [facility] Anticipate transfer today Planned radiation and possible immunotherapy as directed by oncology Follow-up for pain management as needed PMT will continue to follow and support as needed  Symptom Management:  Per primary team PMT is available to assist as needed  Code Status: Limited code  Prognosis: Unable to determine  Discharge Planning: To Be Determined  Discussed with: Patient, medical team, nursing team  Thank you for allowing [place] to participate in the care of Margaret Dyer PMT will continue to support holistically.  Time Total: 75 min  Visit consisted of counseling and education dealing with the complex and emotionally intense issues of symptom management and palliative care in the setting of serious and potentially life-threatening illness. Greater than 50%  of  this time was spent counseling and coordinating care related to the above assessment and plan.  Walden Field, NP Palliative Medicine Team  Team Phone # [phone] (Nights/Weekends)  08/02/2021, 8:17 AM

## 2022-11-07 NOTE — Progress Notes (Signed)
Unfortunately, I do still think that we will be able to get a tissue diagnosis on her right now.  I thought maybe we could get a lung biopsy on her but apparently from what radiology says, she has to be prone for this.  She just does not think she can lie down on her stomach right now.  I do think that we may have a diagnosis on her right now.  Her alpha-fetoprotein came back at 81,890.  As such, we actually are probably looking at metastatic hepatocellular carcinoma.  I really do not think this changes anything.  With hepatocellular carcinoma, we still treat with immunotherapy.  As such, I think we can avoid a biopsy for her now.  I spoke with Radiation Oncology.  They will do radiation therapy on her.  Again it would be nice to do a kyphoplasty but I do still think she is going to be able to lie down on her stomach.  They would want her over at [facility].  I talked her about this this morning.  She is worried about having to move over to [facility].  She thinks that she needs help from her family to move her stuff over.  One of her daughters is sick.  The other is working.  She would prefer to go over in a day or 2.  I told her that I am not sure this is possible and that if there is a bed available, that she would move over today.  I do not have back the alpha-fetoprotein level when I saw her this rate.  I will have to speak to her daughter about this.  At least, I think we can avoid a biopsy at this point.  I will have to talk to her about treatment.  Again we use immunotherapy along with targeted therapy and this has been quite effective.  She is on some steroids now to try to help with this pneumonia.  She does not appear to be as short of breath.  Her appetite has been okay.  Her labs show white count of 3.4.  Hemoglobin 11.9.  Platelet count 107,000.  Her BUN is 42 creatinine 1.95.  Her calcium is 6.8.  Albumin is 2.6.  Potassium is on the higher side at 5.4.  On her  physical exam, her temperature is 98.2.  Pulse 100.  Blood pressure 133/69.  Her lungs still sound clear bilaterally.  She has good air movement bilaterally.  Cardiac exam regular rate and rhythm.  Abdomen is soft.  Bowel sounds are present.  Extremity shows no clubbing, cyanosis or edema.  Neurological exam is nonfocal.  Again, given the incredibly elevated alpha-fetoprotein level, she, by definition, has a metastatic hepatocellular carcinoma.  As such, we do not need a biopsy.  It would still be nice to do kyphoplasty at L4 just for pain control for quality of life.  This could always be done later.  I do think radiation would not be a bad idea.  Again, whenever she can move over to [facility], Radiation Oncology will see her.  I know she has had fantastic care from everybody on [address].  I really do appreciate the compassion.   [name], MD  [place] 5:2

## 2022-11-07 NOTE — Progress Notes (Signed)
Daughter ([name])updated about patient transfer to [place] [id number].

## 2022-11-07 NOTE — Progress Notes (Signed)
PROGRESS NOTE  Margaret Dyer  DOB: 1935-04-25  PCP: Ann Held, DO DTO:[id number]  DOA: 11/02/2022  LOS: 53 days  Hospital Day: 15  Brief narrative: Margaret Dyer is a 87 y.o. female with PMH significant for DM2, HTN, HLD, chronic diastolic CHF, pulmonary hypertension, liver cirrhosis, CKD 3, renal cell carcinoma with left nephrectomy 2000, hypothyroidism. 11/21, patient presented to the ED with complaint of intractable back pain after a fall CT scan showed acute compression fracture of L4 vertebral body.  It also showed innumerable bilateral pulmonary metastases, posterior left acetabular wall fracture of indeterminate age. MRI confirmed the findings. Admitted to hospitalist service Consultation was obtained from radiology, oncology -IR procedure postponed due to patient not being able to lay on abdomen.   -defer to Dr. Marin Olp timing of kyphoplasty or biopsy (currently on hold and plan is to transfer to [facility] for radiation)  Subjective: Currently her biggest worry is how to get all her flowers over to [facility] when she is transported  Assessment and plan: Intractable back pain with compression fracture of L4 with concern for pathological fracture and metastasis MRI, CT of the pelvis showed compression fracture of L4, pathological with 30% vertebral height loss Interventional radiology consulted for vertebroplasty/ablation and bone biopsy. Aspirin on hold.   - kyphoplasy on hold-- plan is to transfer to [facility] for radiation Continue pain management.  PNA -pro calcitonin elevated but no fever, no WBC -x ray suggestive of infiltrate -IV abx started 12/1- xray with stability and improvement in some areas NP swab negative for viruses  Innumerable bilateral pulmonary metastasis History of renal cell CA with left nephrectomy CT scan obtained on admission showed innumerable bilateral pulmonary metastases as well as possible liver mets and bone mets.  Oncology consult appreciated-  suspected metastatic renal cell carcinoma 11/27, underwent liver biopsy - report unrevealing -12/5 per oncology-- tx to [facility] for radiation  Posterior left acetabular wall fracture of indeterminate age CT of the left hip showed small fracture of posterior acetabular wall of indeterminate age 36/22, seen by [facility], recommended PWB with assistance of a walker, Follow in office as outpt 3 weeks for repeat xrays   Klebsiella UTI Patient was recently diagnosed with UTI, urine culture showing Klebsiella.   She has only taken 2 days of oral antibiotics, Augmentin, then 3 days of iv rocephin in the hospital, last dose on 11/24.   CKD stage IV history of renal cell carcinoma with left  nephrectomy Baseline creatinine close to 1.8.  Continue to monitor- especially while restarting demadex QOD  Type 2 diabetes mellitus with hypoglycemia A1c 6.4 on 10/25/2022 SSI  COPD /Chronic hypoxic respiratory failure  on home O2 3.5liters -schedule nebs  Essential hypertension Blood pressure currently stable. PTA on carvedilol 12.5 mg twice daily, hydralazine 100 mg 3 times daily Continue both.  HLD Zetia, statin  Hypothyroidism On Synthroid 25 mcg daily on weekdays. Continue the same.   Hyperkalemia -lokelma  Thrombocytopenia, chronic Does has ecchymosis ,but no overt bleeding Management per hematology oncology Dr. Marin Olp  Failure to thrive Secondary to cancer.  Palliative consult appreciated.  Obesity Estimated body mass index is 37.57 kg/m as calculated from the following:   Height as of this encounter: _0  (1.6 m).   Weight as of this encounter: 96.2 kg.    Code Status: Partial Code    Mobility: Encourage ambulation.  PT eval  Infusions:   sodium chloride     meropenem (MERREM) IV 500 mg (11/06/22 2114)  Scheduled Meds:  acetaminophen  1,000 mg Oral Q6H   carvedilol  12.5 mg Oral BID WC   ezetimibe  10 mg Oral Daily   And   simvastatin  20 mg Oral q1800    feeding supplement  237 mL Oral TID BM   fentaNYL  1 patch Transdermal Q72H   gabapentin  100 mg Oral QHS   heparin  5,000 Units Subcutaneous Q8H   hydrALAZINE  50 mg Oral Q8H   insulin aspart  0-5 Units Subcutaneous QHS   insulin aspart  0-9 Units Subcutaneous TID WC   insulin glargine-yfgn  5 Units Subcutaneous QHS   ipratropium-albuterol  3 mL Nebulization BID   levothyroxine  25 mcg Oral Q MTWThF   lidocaine  1 patch Transdermal QHS   methylPREDNISolone (SOLU-MEDROL) injection  120 mg Intravenous Q12H   multivitamin with minerals  1 tablet Oral Daily   oxybutynin  5 mg Oral Daily   senna-docusate  1 tablet Oral QHS   [START ON 11/08/2022] torsemide  20 mg Oral QODAY    PRN meds: albuterol, bisacodyl, HYDROmorphone (DILAUDID) injection, HYDROmorphone, LORazepam, ondansetron     Nutritional status:  Body mass index is 37.57 kg/m.  Nutrition Problem: Increased nutrient needs Etiology: acute illness, chronic illness Signs/Symptoms: estimated needs   DVT prophylaxis:  heparin injection 5,000 Units Start: 10/25/22 1400 SCDs Start: 10/25/22 0220   Consultants: Oncology, IR Family Communication: called daughter  Status is: Inpatient  Continue inhospital care because: Pending kyphoplasty Level of care: Med-Surg   Dispo: The patient is from: Home              Anticipated d/c is to: Pending clinical course- SNF probably once biopsy done              Patient currently is not medically stable to d/c.- needs to be tx to [facility] for radiation   Difficult to place patient No- has SNF bed when ready for d/c      Objective: Vitals:   11/07/22 0807 11/07/22 0814  BP: (!) 118/54   Pulse: 65   Resp:    Temp: (!) 97.5 F (36.4 C)   SpO2: 95% 96%    Intake/Output Summary (Last 24 hours) at 11/07/2022 1140 Last data filed at 11/06/2022 2131 Gross per 24 hour  Intake 489.94 ml  Output --  Net 489.94 ml   Filed Weights   11/22/2022 1635 11/05/22 0700 11/07/22 0500  Weight:  85.8 kg 91.7 kg 96.2 kg   Weight change:  Body mass index is 37.57 kg/m.   Physical Exam:    General: Appearance:    Obese female in no acute distress     Lungs:     respirations unlabored  Heart:    Normal heart rate. Normal rhythm. No murmurs, rubs, or gallops.   MS:   All extremities are intact.   Neurologic:   Awake, alert           Data Review: I have personally reviewed the laboratory data and studies available.   Signed, Geradine Girt, DO [facility] 11/07/2022

## 2022-11-07 NOTE — Progress Notes (Signed)
[facility] transported pt to [facility]

## 2022-11-07 NOTE — Progress Notes (Signed)
Physical Therapy Treatment Patient Details Name: Margaret Dyer MRN: [id number] DOB: 1935/05/24 Today's Date: 11/07/2022   History of Present Illness 87 yo female presents to [facility] on 11/21 with weakness, back/L hip pain, fall x1 month ago.  Pt found to have compression vs pathologic fracture of L4, L acetabular fx. CT scan of the chest/abdomen/pelvis shows multiple bilat pulmonary mets, hepatic mets. S/p liver mass biopsy 11/27, plan for KP on 11/30 but pt too ShOB, CXR shows R PNA. PMH significant for chronic respiratory failure on 3.5LO2 at baseline, DM2, HTN, HLD, chronic diastolic CHF, pulmonary hypertension, liver cirrhosis, CKD 3, renal cell carcinoma with left nephrectomy 2000, hypothyroidism, osteopenia, L hip IMN 2020.    PT Comments    Pt received in supine, agreeable to therapy session, with dense cues needed for back precs/safety with mobility including transfer training and pre-gait training at bedside. Pt needing up to modA to perform transfers to/from [facility] and to recliner, pt also performed supine BLE exercises with multimodal cues for technique and would benefit from handout to reinforce back precs/LE exercises next session. Pt reports moderate fatigue/dyspnea and severe pain and unable to progress to household distance gait trial this session. Pt agreeable to sit up in recliner with brace donned and chair alarm on for safety "for a little while, not long", RN notified of pt requesting pain meds/breathing treatment. Pt continues to benefit from PT services to progress toward functional mobility goals.    Recommendations for follow up therapy are one component of a multi-disciplinary discharge planning process, led by the attending physician.  Recommendations may be updated based on patient status, additional functional criteria and insurance authorization.  Follow Up Recommendations  Skilled nursing-short term rehab (<3 hours/day) Can patient physically be transported by private vehicle:  Yes   Assistance Recommended at Discharge Frequent or constant Supervision/Assistance  Patient can return home with the following A lot of help with walking and/or transfers;A lot of help with bathing/dressing/bathroom   Equipment Recommendations  None recommended by PT    Recommendations for Other Services       Precautions / Restrictions Precautions Precautions: Fall;Back Precaution Booklet Issued: No Precaution Comments: reviewed log roll OOB, use of brace OOB, pt with poor recall of precs and brace wear schedule from previous session Required Braces or Orthoses: Other Brace Other Brace: LSO corset brace Restrictions Weight Bearing Restrictions: Yes LLE Weight Bearing: Partial weight bearing LLE Partial Weight Bearing Percentage or Pounds: none specified, ortho states "PWB with assistance of a walker"     Mobility  Bed Mobility Overal bed mobility: Needs Assistance Bed Mobility: Supine to Sit     Supine to sit: HOB elevated, Mod assist     General bed mobility comments: assist for log roll, trunk elevation, dense cues needed    Transfers Overall transfer level: Needs assistance Equipment used: Rolling walker (2 wheels) Transfers: Sit to/from Stand, Bed to chair/wheelchair/BSC Sit to Stand: Mod assist, Min assist   Step pivot transfers: Min assist, From elevated surface       General transfer comment: assist for power up, rise, steadying, and transfer to/from [facility]. STS from EOB and BSC and to recliner, modA for stand>sit safety due to dense cues and lowering assist needed; minA to rise from higher height BSC.    Ambulation/Gait     Assistive device: Rolling walker (2 wheels)         General Gait Details: ~4-90f pivotal transfers; pt dyspneic and c/o severe pain so defer longer distances;  VSS on 4L [place]   Stairs             Wheelchair Mobility    Modified Rankin (Stroke Patients Only)       Balance Overall balance assessment: Mild deficits  observed, not formally tested Sitting-balance support: No upper extremity supported, Feet supported Sitting balance-Leahy Scale: Fair     Standing balance support: Reliant on assistive device for balance, Bilateral upper extremity supported Standing balance-Leahy Scale: Poor                              Cognition Arousal/Alertness: Awake/alert Behavior During Therapy: WFL for tasks assessed/performed Overall Cognitive Status: Within Functional Limits for tasks assessed                                 General Comments: some decreased recall of instructions from previous session and regarding back precs; needs reinforcement for all precs and for brace donning/use of brace each time she sits EOB prior to mobilizing.        Exercises Other Exercises Other Exercises: supine BLE AROM: ankle pumps, heel slides x10 reps ea Other Exercises: IS x 10 reps pt achieves ~150-250 mL ea    General Comments General comments (skin integrity, edema, etc.): SpO2 WFL on 4L [place], HR 61-69 bpm with exertional tasks      Pertinent Vitals/Pain Pain Assessment Pain Assessment: 0-10 Pain Score: 7  Pain Location: Back "7 or 8" Pain Descriptors / Indicators: Discomfort, Shooting, Sharp Pain Intervention(s): Monitored during session, Repositioned, Patient requesting pain meds-RN notified, Limited activity within patient's tolerance     PT Goals (current goals can now be found in the care plan section) Acute Rehab PT Goals Patient Stated Goal: to get some rehab and get stronger before I go home PT Goal Formulation: With patient Time For Goal Achievement: 11/17/22 Progress towards PT goals: Progressing toward goals    Frequency    Min 2X/week      PT Plan Current plan remains appropriate       AM-PAC PT "6 Clicks" Mobility   Outcome Measure  Help needed turning from your back to your side while in a flat bed without using bedrails?: A Little Help needed moving  from lying on your back to sitting on the side of a flat bed without using bedrails?: A Lot Help needed moving to and from a bed to a chair (including a wheelchair)?: A Lot (dense safety cues) Help needed standing up from a chair using your arms (e.g., wheelchair or bedside chair)?: A Lot Help needed to walk in hospital room?: Total Help needed climbing 3-5 steps with a railing? : Total 6 Click Score: 11    End of Session Equipment Utilized During Treatment: Back brace;Oxygen Activity Tolerance: Patient limited by fatigue;Patient limited by pain Patient left: in chair;with call bell/phone within reach;with chair alarm set;Other (comment) (heels floated, brace still donned) Nurse Communication: Mobility status;Patient requests pain meds;Other (comment) (pt requesting a breathing treatment) PT Visit Diagnosis: Other abnormalities of gait and mobility (R26.89);Muscle weakness (generalized) (M62.81)     Time: 2536-6440 PT Time Calculation (min) (ACUTE ONLY): 27 min  Charges:  $Therapeutic Activity: 23-37 mins                     Burgess Sheriff P., PTA Acute Rehabilitation Services Secure Chat Preferred 9a-5:30pm Office: [phone]  Kara Pacer Cailynn Bodnar 11/07/2022, 6:46 PM

## 2022-11-08 ENCOUNTER — Encounter (HOSPITAL_COMMUNITY): Payer: Self-pay | Admitting: Family Medicine

## 2022-11-08 DIAGNOSIS — C220 Liver cell carcinoma: Secondary | ICD-10-CM

## 2022-11-08 DIAGNOSIS — R52 Pain, unspecified: Secondary | ICD-10-CM | POA: Diagnosis not present

## 2022-11-08 DIAGNOSIS — C7800 Secondary malignant neoplasm of unspecified lung: Secondary | ICD-10-CM

## 2022-11-08 HISTORY — DX: Secondary malignant neoplasm of unspecified lung: C22.0

## 2022-11-08 LAB — COMPREHENSIVE METABOLIC PANEL
ALT: 12 U/L (ref 0–44)
AST: 30 U/L (ref 15–41)
Albumin: 2.9 g/dL — ABNORMAL LOW (ref 3.5–5.0)
Alkaline Phosphatase: 112 U/L (ref 38–126)
Anion gap: 8 (ref 5–15)
BUN: 57 mg/dL — ABNORMAL HIGH (ref 8–23)
CO2: 24 mmol/L (ref 22–32)
Calcium: 6.7 mg/dL — ABNORMAL LOW (ref 8.9–10.3)
Chloride: 106 mmol/L (ref 98–111)
Creatinine, Ser: 2.06 mg/dL — ABNORMAL HIGH (ref 0.44–1.00)
GFR, Estimated: 23 mL/min — ABNORMAL LOW (ref >60)
Glucose, Bld: 247 mg/dL — ABNORMAL HIGH (ref 70–99)
Potassium: 6.5 mmol/L (ref 3.5–5.1)
Sodium: 138 mmol/L (ref 135–145)
Total Bilirubin: 0.5 mg/dL (ref 0.3–1.2)
Total Protein: 6.3 g/dL — ABNORMAL LOW (ref 6.5–8.1)

## 2022-11-08 LAB — CBC WITH DIFFERENTIAL/PLATELET
Abs Immature Granulocytes: 0.03 10*3/uL (ref 0.00–0.07)
Basophils Absolute: 0 10*3/uL (ref 0.0–0.1)
Basophils Relative: 0 %
Eosinophils Absolute: 0 10*3/uL (ref 0.0–0.5)
Eosinophils Relative: 0 %
HCT: 37.1 % (ref 36.0–46.0)
Hemoglobin: 11 g/dL — ABNORMAL LOW (ref 12.0–15.0)
Immature Granulocytes: 1 %
Lymphocytes Relative: 8 %
Lymphs Abs: 0.5 10*3/uL — ABNORMAL LOW (ref 0.7–4.0)
MCH: 28.6 pg (ref 26.0–34.0)
MCHC: 29.6 g/dL — ABNORMAL LOW (ref 30.0–36.0)
MCV: 96.6 fL (ref 80.0–100.0)
Monocytes Absolute: 0.1 10*3/uL (ref 0.1–1.0)
Monocytes Relative: 2 %
Neutro Abs: 5.8 10*3/uL (ref 1.7–7.7)
Neutrophils Relative %: 89 %
Platelets: 120 10*3/uL — ABNORMAL LOW (ref 150–400)
RBC: 3.84 MIL/uL — ABNORMAL LOW (ref 3.87–5.11)
RDW: 17.3 % — ABNORMAL HIGH (ref 11.5–15.5)
WBC: 6.4 10*3/uL (ref 4.0–10.5)
nRBC: 0 % (ref 0.0–0.2)

## 2022-11-08 LAB — GLUCOSE, CAPILLARY
Glucose-Capillary: 241 mg/dL — ABNORMAL HIGH (ref 70–99)
Glucose-Capillary: 282 mg/dL — ABNORMAL HIGH (ref 70–99)
Glucose-Capillary: 271 mg/dL — ABNORMAL HIGH (ref 70–99)
Glucose-Capillary: 206 mg/dL — ABNORMAL HIGH (ref 70–99)

## 2022-11-08 MED ORDER — METOPROLOL TARTRATE 5 MG/5ML IV SOLN: 5.0000 mg | INTRAVENOUS | Status: DC | PRN

## 2022-11-08 MED ORDER — SENNOSIDES-DOCUSATE SODIUM 8.6-50 MG PO TABS: 1.0000 | ORAL_TABLET | Freq: Every evening | ORAL | Status: DC | PRN

## 2022-11-08 MED ORDER — IPRATROPIUM-ALBUTEROL 0.5-2.5 (3) MG/3ML IN SOLN
3.0000 mL | RESPIRATORY_TRACT | Status: DC | PRN
  Administered 2022-11-10 – 2022-11-13 (×5): 3 mL via RESPIRATORY_TRACT
  Filled 2022-11-08 (×5): qty 3

## 2022-11-08 MED ORDER — OXYCODONE HCL 5 MG PO TABS
5.0000 mg | ORAL_TABLET | ORAL | Status: DC | PRN
  Administered 2022-11-10: 5 mg via ORAL
  Filled 2022-11-08: qty 1

## 2022-11-08 MED ORDER — INSULIN GLARGINE-YFGN 100 UNIT/ML ~~LOC~~ SOLN
9.0000 [IU] | Freq: Every day | SUBCUTANEOUS | Status: DC
  Administered 2022-11-08 – 2022-11-20 (×12): 9 [IU] via SUBCUTANEOUS
  Filled 2022-11-08 (×13): qty 0.09

## 2022-11-08 MED ORDER — HYDRALAZINE HCL 20 MG/ML IJ SOLN: 10.0000 mg | INTRAMUSCULAR | Status: DC | PRN

## 2022-11-08 MED ORDER — DOCUSATE SODIUM 100 MG PO CAPS
100.0000 mg | ORAL_CAPSULE | Freq: Two times a day (BID) | ORAL | Status: DC
  Administered 2022-11-08 – 2022-11-22 (×27): 100 mg via ORAL
  Filled 2022-11-08 (×27): qty 1

## 2022-11-08 MED ORDER — SODIUM ZIRCONIUM CYCLOSILICATE 10 G PO PACK
10.0000 g | PACK | Freq: Two times a day (BID) | ORAL | Status: AC
  Administered 2022-11-08 (×2): 10 g via ORAL
  Filled 2022-11-08 (×2): qty 1

## 2022-11-08 MED ORDER — TRAZODONE HCL 50 MG PO TABS: 50.0000 mg | ORAL_TABLET | Freq: Every evening | ORAL | Status: DC | PRN

## 2022-11-08 NOTE — Progress Notes (Addendum)
PROGRESS NOTE    Margaret Dyer  ZTI:[id number] DOB: 1934/12/09 DOA: 10/21/2022 PCP: Ann Held, DO   Brief Narrative:  87 year old with history of DM2, HTN, HLD, diastolic CHF, pulmonary HTN, cirrhosis, CKD stage IIIa, renal cell carcinoma with left nephrectomy in 2000, hypothyroidism presented to hospital with intractable back pain.  CT scan showed compression fracture of L4 with innumerable bilateral pulmonary metastases, posterior left acetabular wall fracture.  MRI confirmed these findings and patient was admitted to the hospital.  Radiology and oncology were consulted.  IR procedure for kyphoplasty or biopsy was postponed as patient was unable to lay flat.  Patient was transferred to [facility] for radiation treatments   Assessment & Plan:  Principal Problem:   Intractable pain Active Problems:   Hyperlipidemia   Gout, unspecified   Essential hypertension   Asthma   Anxiety and depression   Platelets decreased ([place])   Diabetes mellitus type 2, insulin dependent ([place])   History of nephrectomy   Liver cirrhosis secondary to NASH (HCC)   Thrombocytopenia (HCC)   Hypothyroidism   Chronic hypoxemic respiratory failure (HCC)   Chronic diastolic CHF (congestive heart failure) (HCC)   Pulmonary hypertension (HCC)   Chronic renal disease, stage 4, severely decreased glomerular filtration rate (GFR) between 15-29 mL/min/1.73 square meter (HCC)   OSA (obstructive sleep apnea)   Restrictive lung disease   Pathological fracture    Intractable back pain with compression fracture of L4 with concern for pathological fracture and metastasis MRI, CT of the pelvis showed compression fracture of L4, pathological with 30% vertebral height loss Interventional radiology consulted for vertebroplasty/ablation and bone biopsy.  Kyphoplasty option currently remains on hold.  Patient transferred to [facility] for radiation treatment.  Pain management. Aspirin on hold.    Innumerable  bilateral pulmonary metastasis History of renal cell CA with left nephrectomy CT scan obtained on admission showed innumerable bilateral pulmonary metastases as well as possible liver mets and bone mets.  Oncology suspecting metastatic hepatocellular carcinoma due to elevated AFP.  Plan is for radiation therapy and eventually outpatient immunotherapy.  Will defer these discussions to oncology team. 11/27, underwent liver biopsy -showed cirrhosis but no malignant disease -12/5 per oncology-- tx to [facility] for radiation On steroids started by oncology  Hyperkalemia - Potassium this morning 6.5 per nursing staff.  EKG does not show any acute changes.  Will give Lokelma and monitor   PNA Procalcitonin 0.35 x-ray was suggestive of infiltrate, repeat clear.  Patient did receive 5 days of IV meropenem   Posterior left acetabular wall fracture of indeterminate age CT of the left hip showed small fracture of posterior acetabular wall of indeterminate age 103/22, seen by [facility], recommended PWB with assistance of a walker, Follow in office as outpt 3 weeks for repeat xrays   Klebsiella UTI Treated earlier during her hospitalization   CKD stage IV history of renal cell carcinoma with left  nephrectomy Baseline creatinine close to 1.8.  Continue to monitor this   Type 2 diabetes mellitus with hypoglycemia A1c 6.4 on 10/25/2022 SSI.  Semglee 5 units daily   COPD /Chronic hypoxic respiratory failure  on home O2 3.5liters Bronchodilators scheduled and as necessary   Essential hypertension Currently on Coreg 12.5 mg twice daily, hydralazine, daily Demadex.  IV as needed   HLD On Zetia and Zocor   Hypothyroidism On Synthroid 25 mcg daily on weekdays. Continue the same.   Hyperkalemia -lokelma   Thrombocytopenia, chronic Does has ecchymosis ,but  no overt bleeding Management per hematology oncology Dr. Marin Olp   Failure to thrive Secondary to cancer.  Palliative consult  appreciated.   Obesity Estimated body mass index is 37.57 kg/m as calculated from the following:   Height as of this encounter: _0  (1.6 m).   Weight as of this encounter: 96.2 kg.    Code Status: Partial Code   Patient has been seen by palliative care team.  Appreciate their input. PT-SNF  DVT prophylaxis: Subcu heparin Code Status: Partial code-DNI Family Communication:    Status is: Inpatient Maintain hospital stay for pain control and getting radiation treatments.   Nutritional status    Signs/Symptoms: estimated needs  Interventions: Ensure Enlive (each supplement provides 350kcal and 20 grams of protein), Refer to RD note for recommendations  Body mass index is 35.77 kg/m.         Subjective: Seen and examined at bedside, sitting up in the chair.  Does not have any new complaints besides pain in her back from compression fracture   Examination:  General exam: Appears calm and comfortable, elderly frail Respiratory system: Clear to auscultation. Respiratory effort normal. Cardiovascular system: S1 & S2 heard, RRR. No JVD, murmurs, rubs, gallops or clicks. No pedal edema. Gastrointestinal system: Abdomen is nondistended, soft and nontender. No organomegaly or masses felt. Normal bowel sounds heard. Central nervous system: Alert and oriented. No focal neurological deficits. Extremities: Symmetric 5 x 5 power. Skin: No rashes, lesions or ulcers Psychiatry: Judgement and insight appear normal. Mood & affect appropriate.     Objective: Vitals:   11/07/22 2121 11/08/22 0235 11/08/22 0500 11/08/22 0613  BP: (!) 128/53 (!) 136/52  138/61  Pulse: 69 63  63  Resp: _1 Temp: 98 F (36.7 C) 98 F (36.7 C)  98 F (36.7 C)  TempSrc: Oral Oral  Oral  SpO2: 99% 99%  99%  Weight:   91.6 kg   Height:        Intake/Output Summary (Last 24 hours) at 11/08/2022 0741 Last data filed at 11/08/2022 0202 Gross per 24 hour  Intake 340 ml  Output --  Net  340 ml   Filed Weights   11/05/22 0700 11/07/22 0500 11/08/22 0500  Weight: 91.7 kg 96.2 kg 91.6 kg     Data Reviewed:   CBC: Recent Labs  Lab 11/04/22 0254 11/05/22 0207 11/06/22 0408 11/07/22 0419 11/08/22 0604  WBC 4.8 4.6 4.9 3.4* 6.4  NEUTROABS 2.8 2.9 3.0 2.9 5.8  HGB 10.7* 11.0* 10.8* 11.9* 11.0*  HCT 35.9* 35.0* 34.2* 38.8 37.1  MCV 96.2 94.1 93.7 94.2 96.6  PLT 95* 99* 97* 107* 725*   Basic Metabolic Panel: Recent Labs  Lab 11/10/2022 0358 11/04/22 0254 11/05/22 0207 11/06/22 0408 11/07/22 0419  NA 140 140 141 137 137  K 5.1 5.1 5.1 5.0 5.4*  CL 111 108 107 109 108  CO2 _2 21* 23  GLUCOSE 118* 137* 166* 111* 232*  BUN 42* 36* 39* 39* 42*  CREATININE 1.75* 1.72* 1.90* 1.92* 1.95*  CALCIUM 7.0* 7.2* 7.0* 6.6* 6.8*   GFR: Estimated Creatinine Clearance: 22.7 mL/min (A) (by C-G formula based on SCr of 1.95 mg/dL (H)). Liver Function Tests: Recent Labs  Lab 11/02/22 0619 11/17/2022 0358 11/04/22 0254 11/07/22 0419  AST 38 37 34 29  ALT _3 ALKPHOS 127* 122 121 122  BILITOT 0.3 0.5 0.7 0.6  PROT 5.6* 5.8* 5.4* 5.8*  ALBUMIN 2.5* 2.6* 2.5*  2.6*   No results for input(s): "LIPASE", "AMYLASE" in the last 168 hours. No results for input(s): "AMMONIA" in the last 168 hours. Coagulation Profile: Recent Labs  Lab 11/02/22 0737  INR 1.2   Cardiac Enzymes: No results for input(s): "CKTOTAL", "CKMB", "CKMBINDEX", "TROPONINI" in the last 168 hours. BNP (last 3 results) No results for input(s): "PROBNP" in the last 8760 hours. HbA1C: No results for input(s): "HGBA1C" in the last 72 hours. CBG: Recent Labs  Lab 11/06/22 2108 11/07/22 0808 11/07/22 1157 11/07/22 1734 11/07/22 2231  GLUCAP 294* 236* 272* 262* 203*   Lipid Profile: No results for input(s): "CHOL", "HDL", "LDLCALC", "TRIG", "CHOLHDL", "LDLDIRECT" in the last 72 hours. Thyroid Function Tests: No results for input(s): "TSH", "T4TOTAL", "FREET4", "T3FREE", "THYROIDAB" in  the last 72 hours. Anemia Panel: No results for input(s): "VITAMINB12", "FOLATE", "FERRITIN", "TIBC", "IRON", "RETICCTPCT" in the last 72 hours. Sepsis Labs: Recent Labs  Lab 11/28/2022 0358 11/05/22 0207 11/06/22 0408  PROCALCITON 0.45 0.31 0.35    Recent Results (from the past 240 hour(s))  Respiratory (~20 pathogens) panel by PCR     Status: None   Collection Time: 11/06/22  9:14 AM   Specimen: Nasopharyngeal Swab; Respiratory  Result Value Ref Range Status   Adenovirus NOT DETECTED NOT DETECTED Final   Coronavirus 229E NOT DETECTED NOT DETECTED Final    Comment: (NOTE) The Coronavirus on the Respiratory Panel, DOES NOT test for the novel  Coronavirus (2019 nCoV)    Coronavirus HKU1 NOT DETECTED NOT DETECTED Final   Coronavirus NL63 NOT DETECTED NOT DETECTED Final   Coronavirus OC43 NOT DETECTED NOT DETECTED Final   Metapneumovirus NOT DETECTED NOT DETECTED Final   Rhinovirus / Enterovirus NOT DETECTED NOT DETECTED Final   Influenza A NOT DETECTED NOT DETECTED Final   Influenza B NOT DETECTED NOT DETECTED Final   Parainfluenza Virus 1 NOT DETECTED NOT DETECTED Final   Parainfluenza Virus 2 NOT DETECTED NOT DETECTED Final   Parainfluenza Virus 3 NOT DETECTED NOT DETECTED Final   Parainfluenza Virus 4 NOT DETECTED NOT DETECTED Final   Respiratory Syncytial Virus NOT DETECTED NOT DETECTED Final   Bordetella pertussis NOT DETECTED NOT DETECTED Final   Bordetella Parapertussis NOT DETECTED NOT DETECTED Final   Chlamydophila pneumoniae NOT DETECTED NOT DETECTED Final   Mycoplasma pneumoniae NOT DETECTED NOT DETECTED Final    Comment: Performed at [facility], [place]. [address], [place], [place] [address]         Radiology Studies: DG Chest 2 View  Result Date: 11/07/2022 CLINICAL DATA:  Follow-up pneumonia EXAM: CHEST - 2 VIEW COMPARISON:  Chest x-ray 11/05/2022 FINDINGS: Heart is enlarged. There is no focal lung infiltrate, pleural effusion or pneumothorax. There is  linear atelectasis in the left mid lung. There is calcified atherosclerotic disease throughout the aorta. No acute fractures are seen. IMPRESSION: 1. No active cardiopulmonary disease. 2. Cardiomegaly. Electronically Signed   By: Ronney Asters M.D.   On: 11/07/2022 15:26        Scheduled Meds:  acetaminophen  1,000 mg Oral Q6H   carvedilol  12.5 mg Oral BID WC   ezetimibe  10 mg Oral Daily   And   simvastatin  20 mg Oral q1800   feeding supplement  237 mL Oral TID BM   fentaNYL  1 patch Transdermal Q72H   gabapentin  100 mg Oral QHS   heparin  5,000 Units Subcutaneous Q8H   hydrALAZINE  50 mg Oral Q8H   insulin aspart  0-5 Units Subcutaneous QHS   insulin aspart  0-9 Units Subcutaneous TID WC   insulin glargine-yfgn  5 Units Subcutaneous QHS   ipratropium-albuterol  3 mL Nebulization BID   levothyroxine  25 mcg Oral Q MTWThF   lidocaine  1 patch Transdermal QHS   methylPREDNISolone (SOLU-MEDROL) injection  120 mg Intravenous Q12H   multivitamin with minerals  1 tablet Oral Daily   oxybutynin  5 mg Oral Daily   senna-docusate  1 tablet Oral QHS   torsemide  20 mg Oral QODAY   Continuous Infusions:  sodium chloride       LOS: 14 days   Time spent= 35 mins    Britny Riel Arsenio Loader, MD [facility]  If 7PM-7AM, please contact night-coverage  11/08/2022, 7:41 AM

## 2022-11-08 NOTE — Care Management Important Message (Signed)
Important Message  Patient Details IM Letter placed in Patient's room. Name: Margaret Dyer MRN: [id number] Date of Birth: June 26, 1935   Medicare Important Message Given:  Yes     Adreanna, Fickel 11/08/2022, 12:09 PM

## 2022-11-08 NOTE — Progress Notes (Signed)
Margaret Dyer is now over on 6 E. at [facility].  She will hopefully start radiation soon to L4.  She has metastatic hepatocellular carcinoma by virtue of her markedly elevated alpha-fetoprotein.  I talked to her about systemic therapy.  I talked her about how we treat hepatocellular carcinoma.  I told her that we do not use chemotherapy.  I told that we use immunotherapy.  She would be willing to take immunotherapy.  As such, we will have to get a Port-A-Cath placed into her.  Unfortunately, immunotherapy would not be done until she has an outpatient by government rules.  I am not sure when she will start the radiation therapy.  Hopefully, she will go down today to be evaluated.  She still is wearing some oxygen.  She does not feel as short of breath.  She clearly needs a lot of physical therapy.  I know that they worked with her yesterday.  I do appreciate their help.  Her appetite is doing fairly well.  She is having no problems with nausea or vomiting.  There is no lab work back yet today.  She had a chest x-ray yesterday which showed no active pulmonary disease.  Hopefully, this pneumonia that she had is now gone.  Hopefully, we will still be able to think about a vertebroplasty to the L4 vertebral body.  I am glad that she made it over to [facility].  Her vital signs show temperature of 98.  Pulse 63.  Blood pressure 138/61.  Oxygen saturation is 99%.  Her lungs sound clear bilaterally.  Cardiac exam regular rate and rhythm.  Abdomen is soft.  There is no guarding or rebound tenderness.  Extremity shows no clubbing, cyanosis or edema.  Neurological exam shows no focal neurological deficits.  Hopefully, she will start the radiation tomorrow.  We still need to be mindful of her pulmonary status.  I know that she will get incredible care from everybody upon 6 E.  [name], MD  Oswaldo Milian 7:14

## 2022-11-09 ENCOUNTER — Telehealth: Payer: Self-pay | Admitting: *Deleted

## 2022-11-09 ENCOUNTER — Ambulatory Visit
Admit: 2022-11-09 | Discharge: 2022-11-09 | Disposition: A | Discharge status: Home / Self Care | Payer: Medicare Other | Attending: Radiation Oncology | Admitting: Radiation Oncology

## 2022-11-09 ENCOUNTER — Ambulatory Visit
Admission: RE | Admit: 2022-11-09 | Discharge: 2022-11-09 | Disposition: A | Discharge status: Home / Self Care | Payer: Medicare Other | Source: Ambulatory Visit | Attending: Radiation Oncology | Admitting: Radiation Oncology

## 2022-11-09 DIAGNOSIS — C7951 Secondary malignant neoplasm of bone: Secondary | ICD-10-CM | POA: Diagnosis not present

## 2022-11-09 DIAGNOSIS — R52 Pain, unspecified: Secondary | ICD-10-CM | POA: Diagnosis not present

## 2022-11-09 DIAGNOSIS — C220 Liver cell carcinoma: Secondary | ICD-10-CM | POA: Diagnosis not present

## 2022-11-09 LAB — CBC
HCT: 37.4 % (ref 36.0–46.0)
Hemoglobin: 11 g/dL — ABNORMAL LOW (ref 12.0–15.0)
MCH: 28.9 pg (ref 26.0–34.0)
MCHC: 29.4 g/dL — ABNORMAL LOW (ref 30.0–36.0)
MCV: 98.2 fL (ref 80.0–100.0)
Platelets: 118 10*3/uL — ABNORMAL LOW (ref 150–400)
RBC: 3.81 MIL/uL — ABNORMAL LOW (ref 3.87–5.11)
RDW: 17.4 % — ABNORMAL HIGH (ref 11.5–15.5)
WBC: 5.7 10*3/uL (ref 4.0–10.5)
nRBC: 0 % (ref 0.0–0.2)

## 2022-11-09 LAB — GLUCOSE, CAPILLARY
Glucose-Capillary: 180 mg/dL — ABNORMAL HIGH (ref 70–99)
Glucose-Capillary: 207 mg/dL — ABNORMAL HIGH (ref 70–99)
Glucose-Capillary: 258 mg/dL — ABNORMAL HIGH (ref 70–99)
Glucose-Capillary: 177 mg/dL — ABNORMAL HIGH (ref 70–99)
Glucose-Capillary: 231 mg/dL — ABNORMAL HIGH (ref 70–99)
Glucose-Capillary: 208 mg/dL — ABNORMAL HIGH (ref 70–99)

## 2022-11-09 LAB — COMPREHENSIVE METABOLIC PANEL
ALT: 17 U/L (ref 0–44)
AST: 33 U/L (ref 15–41)
Albumin: 2.9 g/dL — ABNORMAL LOW (ref 3.5–5.0)
Alkaline Phosphatase: 106 U/L (ref 38–126)
Anion gap: 8 (ref 5–15)
BUN: 70 mg/dL — ABNORMAL HIGH (ref 8–23)
CO2: 21 mmol/L — ABNORMAL LOW (ref 22–32)
Calcium: 6.4 mg/dL — CL (ref 8.9–10.3)
Chloride: 107 mmol/L (ref 98–111)
Creatinine, Ser: 2.09 mg/dL — ABNORMAL HIGH (ref 0.44–1.00)
GFR, Estimated: 23 mL/min — ABNORMAL LOW (ref >60)
Glucose, Bld: 196 mg/dL — ABNORMAL HIGH (ref 70–99)
Potassium: 6.1 mmol/L — ABNORMAL HIGH (ref 3.5–5.1)
Sodium: 136 mmol/L (ref 135–145)
Total Bilirubin: 0.6 mg/dL (ref 0.3–1.2)
Total Protein: 6.1 g/dL — ABNORMAL LOW (ref 6.5–8.1)

## 2022-11-09 LAB — POTASSIUM: Potassium: 5.7 mmol/L — ABNORMAL HIGH (ref 3.5–5.1)

## 2022-11-09 LAB — MAGNESIUM: Magnesium: 3.2 mg/dL — ABNORMAL HIGH (ref 1.7–2.4)

## 2022-11-09 MED ORDER — CALCIUM GLUCONATE-NACL 2-0.675 GM/100ML-% IV SOLN
2.0000 g | Freq: Once | INTRAVENOUS | Status: AC
  Administered 2022-11-09: 2000 mg via INTRAVENOUS
  Filled 2022-11-09: qty 100

## 2022-11-09 MED ORDER — INSULIN ASPART 100 UNIT/ML IJ SOLN
2.0000 [IU] | Freq: Three times a day (TID) | INTRAMUSCULAR | Status: DC
  Administered 2022-11-09 – 2022-11-20 (×30): 2 [IU] via SUBCUTANEOUS

## 2022-11-09 MED ORDER — ACETAMINOPHEN 500 MG PO TABS
1000.0000 mg | ORAL_TABLET | Freq: Four times a day (QID) | ORAL | Status: DC | PRN
  Administered 2022-11-12 – 2022-11-19 (×2): 1000 mg via ORAL
  Filled 2022-11-09 (×2): qty 2

## 2022-11-09 MED ORDER — ALBUTEROL SULFATE (2.5 MG/3ML) 0.083% IN NEBU
10.0000 mg | INHALATION_SOLUTION | Freq: Once | RESPIRATORY_TRACT | Status: AC
  Administered 2022-11-09: 10 mg via RESPIRATORY_TRACT
  Filled 2022-11-09: qty 12

## 2022-11-09 MED ORDER — DEXTROSE 50 % IV SOLN
1.0000 | Freq: Once | INTRAVENOUS | Status: AC
  Administered 2022-11-09: 50 mL via INTRAVENOUS
  Filled 2022-11-09: qty 50

## 2022-11-09 MED ORDER — SODIUM ZIRCONIUM CYCLOSILICATE 10 G PO PACK
10.0000 g | PACK | Freq: Three times a day (TID) | ORAL | Status: AC
  Administered 2022-11-09 (×3): 10 g via ORAL
  Filled 2022-11-09 (×3): qty 1

## 2022-11-09 MED ORDER — INSULIN ASPART 100 UNIT/ML IV SOLN
5.0000 [IU] | Freq: Once | INTRAVENOUS | Status: AC
  Administered 2022-11-09: 5 [IU] via INTRAVENOUS

## 2022-11-09 MED ORDER — DEXAMETHASONE 4 MG PO TABS
4.0000 mg | ORAL_TABLET | Freq: Two times a day (BID) | ORAL | Status: DC
  Administered 2022-11-09 – 2022-11-20 (×23): 4 mg via ORAL
  Filled 2022-11-09 (×23): qty 1

## 2022-11-09 MED ORDER — SODIUM CHLORIDE 0.9 % IV SOLN: INTRAVENOUS | Status: AC

## 2022-11-09 NOTE — Progress Notes (Signed)
Radiation Oncology         [phone] ________________________________  Initial Inpatient Consultation  Name: Margaret Dyer MRN: [id number]  Date: 11/09/2022  DOB: [birth year]  YY:QMGNO Chase, Alferd Apa, DO  Ennever, Rudell Cobb, MD   REFERRING PHYSICIAN: Volanda Napoleon, MD  DIAGNOSIS: The encounter diagnosis was Hepatocellular carcinoma metastatic to bone [facility]).  Metastatic hepatocellular carcinoma with osseous metastasis to L4 and pulmonary metastases: non-biopsy proven but suspected based on alpha-fetoprotein levels    Cancer Staging  Hepatocellular carcinoma metastatic to lung [facility]) Staging form: Liver, AJCC 8th Edition - Clinical stage from 11/08/2022: Stage IVB (cT3, pM1) - Signed by Volanda Napoleon, MD on 11/08/2022  HISTORY OF PRESENT ILLNESS::Margaret Dyer is a 87 y.o. female who is being seen as a courtesy of Dr. Marin Olp for an opinion concerning radiation therapy as part of management for her recently diagnosed L4 osseous metastasis. She has a past medical history of chronic respiratory failure on 3.5 L oxygen at baseline, renal cell carcinoma s/p nephrectomy in 2000, liver cirrhosis, and brittle diabetes.  At baseline she uses a cane.   Approximately 3 years ago, the patient fell and broke her hip and had an ORIF done.  Since then, she has had mild pain on and off however, she also suffered a fall 3 or so months ago while in the bathroom. Following that incident, her hip pain became constant, severe, and unbearable. Her pain originates in her lower back and radiates down her left leg almost to her knee. Per encounter notes, the patient's PCP had prescribed her tramadol which had worked initially but eventually lost effectiveness. She also has incontinence secondary to her hip pain since her pain limits her ability to get to the bathroom quick enough. She also became incontinent of stool approximately 3 weeks ago which has since resolved.   Given her progressive condition and  hip/back pain, the patient's daughter brought her to the ED and she was admitted on 10/07/2022 for further evaluation and management.   CT of the lumbar spine performed on 11/02/2022 showed an acute compression fracture of the L4 vertebral body with approximately 20% vertebral body height loss. Mild retropulsion of the superior endplate into the spinal canal was also appreciated. CT of the pelvis also performed showed similar findings.   MRI of the lumbar spine demonstrated findings concerning for pathologic fracture of the L4 vertebral body, with approximately 30% vertebral body height loss and bowing of the posterior cortex, causing moderate spinal canal stenosis posterior to the L4 vertebral body, as well as mild left neural foraminal narrowing at L4-L5. Findings are concerning for metastatic disease in the setting of the patient's history of renal cell carcinoma. (MRI also showed L5-S1 mild right neural foraminal narrowing, which may possibly affect the descending left S1 nerve roots).   CT CAP on 10/25/22 unfortunately revealed innumerable bilateral pulmonary metastases, and hepatic cirrhosis with areas of hypoattenuation in both hepatic lobes concerning for metastases.   MRI of the brain on 10/25/22 showed no evidence of intracranial metastatic disease or abnormality.   Ultrasound of the right upper abdomen on 10/28/22 (for further evaluation of the hepatic mass seen on CT) showed a 2.3 x 3.2 x 2 cm focal hyperechoic area/mass within the right liver, suspicious for metastasis or hepatoma given patient history.   Liver biopsy performed on 10/30/22 showed no evidence of malignancy. However, findings of stage 4 cirrhosis and grade 1 mildly active steatohepatitis were noted.    The patient  has been evaluated by Dr. Marin Olp at bedside. Per Dr. Marin Olp, we were not able to get a tissue diagnosis on her given that she would have to be in a prone position for biopsies, and the patient was worried about  laying on her stomach. However, labs reviewed by Dr. Marin Olp showed alpha-fetoprotein at 81,890, which likely aligns with a diagnosis of metastatic hepatocellular carcinoma. Dr. Marin Olp reached out to me for consideration of radiation to L4 which we will discuss in detail today. In terms of other treatment options, Dr. Marin Olp would like to get her on some form of immunotherapy after she is discharged.   She was seen at bedside today and reports that she is not experiencing any back pain while in bed and on pain medications. She denies any numbness or tingling in her legs. She does have a long history of incontinence due to not being able to make it to the bathroom on time, but this has not worsened.   PREVIOUS RADIATION THERAPY: No  PAST MEDICAL HISTORY:  Past Medical History:  Diagnosis Date   Asthma    Blood transfusion    Chronic diastolic CHF (congestive heart failure) (HCC)    CKD (chronic kidney disease), stage III (HCC)    Coronary artery calcification seen on CT scan    Diabetes mellitus    Erythropoietin deficiency anemia 09/19/2021   Goiter    Hepatocellular carcinoma metastatic to lung ([place]) 11/08/2022   Hyperlipidemia    Hypertension    Hypothyroidism    Iron deficiency anemia due to chronic blood loss 09/19/2021   Liver cirrhosis (HCC)    NASH (nonalcoholic steatohepatitis)    Obesity    Osteopenia    Pulmonary hypertension (HCC)    Renal cell carcinoma    a. prior nephrectomy.   Right heart failure (HCC)    Thrombocytopenia ([place])     PAST SURGICAL HISTORY: Past Surgical History:  Procedure Laterality Date   ABDOMINAL HYSTERECTOMY     APPENDECTOMY     HEMORRHOID SURGERY     INTRAMEDULLARY (IM) NAIL INTERTROCHANTERIC Left 12/25/2018   Procedure: INTRAMEDULLARY (IM) NAIL INTERTROCHANTRIC;  Surgeon: Nicholes Stairs, MD;  Location: [facility];  Service: Orthopedics;  Laterality: Left;   KNEE SURGERY     NEPHRECTOMY  09.2000    FAMILY HISTORY:  Family History   Problem Relation Age of Onset   Coronary artery disease Mother    Kidney cancer Father    Cancer Father        renal   COPD Father    Congestive Heart Failure Father    Cancer Sister        bladder   Coronary artery disease Other    Diabetes Other    Hyperlipidemia Other    Hypertension Other    Rheumatologic disease Neg Hx     SOCIAL HISTORY:  Social History   Tobacco Use   Smoking status: Former    Packs/day: 0.25    Types: Cigarettes    Quit date: 12/04/1973    Years since quitting: 48.9   Smokeless tobacco: Never   Tobacco comments:    05/30/18 none in 40 years  Vaping Use   Vaping Use: Never used  Substance Use Topics   Alcohol use: No    Alcohol/week: 0.0 standard drinks of alcohol   Drug use: No    ALLERGIES:  Allergies  Allergen Reactions   Other Other (See Comments)    Patient has hemorrhaged at least 4 times in her  lifetime   Cefuroxime Axetil Nausea Only        Lisinopril Cough   Bactrim [Sulfamethoxazole-Trimethoprim] Other (See Comments)    Made her very sick, that's all she remembers   Ciprofloxacin Nausea Only    MEDICATIONS:  No current facility-administered medications for this encounter.   No current outpatient medications on file.   Facility-Administered Medications Ordered in Other Encounters  Medication Dose Route Frequency Provider Last Rate Last Admin   0.9 %  sodium chloride infusion   Intravenous Continuous Boisseau, Hayley, PA       0.9 %  sodium chloride infusion   Intravenous Continuous Amin, Ankit Chirag, MD 75 mL/hr at 11/09/22 0830 New Bag at 11/09/22 0830   acetaminophen (TYLENOL) tablet 1,000 mg  1,000 mg Oral Q6H Crosley, Debby, MD   1,000 mg at 11/09/22 0641   bisacodyl (DULCOLAX) suppository 10 mg  10 mg Rectal Daily PRN Florencia Reasons, MD   10 mg at 11/05/22 1240   carvedilol (COREG) tablet 12.5 mg  12.5 mg Oral BID WC Crosley, Debby, MD   12.5 mg at 11/09/22 0906   docusate sodium (COLACE) capsule 100 mg  100 mg Oral BID  Amin, Ankit Chirag, MD   100 mg at 11/08/22 2106   ezetimibe (ZETIA) tablet 10 mg  10 mg Oral Daily Crosley, Debby, MD   10 mg at 11/08/22 1745   And   simvastatin (ZOCOR) tablet 20 mg  20 mg Oral q1800 Crosley, Debby, MD   20 mg at 11/08/22 1745   feeding supplement (ENSURE ENLIVE / ENSURE PLUS) liquid 237 mL  237 mL Oral TID BM Florencia Reasons, MD   237 mL at 11/09/22 1517   fentaNYL (DURAGESIC) 25 MCG/HR 1 patch  1 patch Transdermal Q72H Crosley, Debby, MD   1 patch at 11/06/22 2058   gabapentin (NEURONTIN) capsule 100 mg  100 mg Oral QHS Rosezella Rumpf, NP   100 mg at 11/08/22 2105   heparin injection 5,000 Units  5,000 Units Subcutaneous Q8H Crosley, Debby, MD   5,000 Units at 11/09/22 1518   hydrALAZINE (APRESOLINE) injection 10 mg  10 mg Intravenous Q4H PRN Amin, Ankit Chirag, MD       hydrALAZINE (APRESOLINE) tablet 50 mg  50 mg Oral Q8H Florencia Reasons, MD   50 mg at 11/09/22 1518   HYDROmorphone (DILAUDID) injection 1 mg  1 mg Intravenous Q3H PRN Rosezella Rumpf, NP   1 mg at 11/02/22 0820   HYDROmorphone (DILAUDID) tablet 2 mg  2 mg Oral Q4H PRN Rosezella Rumpf, NP   2 mg at 11/09/22 1518   insulin aspart (novoLOG) injection 0-5 Units  0-5 Units Subcutaneous QHS Rai, Ripudeep K, MD   2 Units at 11/08/22 2327   insulin aspart (novoLOG) injection 0-9 Units  0-9 Units Subcutaneous TID WC Rai, Ripudeep K, MD   2 Units at 11/09/22 1216   insulin aspart (novoLOG) injection 2 Units  2 Units Subcutaneous TID WC Amin, Ankit Chirag, MD       insulin glargine-yfgn (SEMGLEE) injection 9 Units  9 Units Subcutaneous QHS Amin, Ankit Chirag, MD   9 Units at 11/08/22 2327   ipratropium-albuterol (DUONEB) 0.5-2.5 (3) MG/3ML nebulizer solution 3 mL  3 mL Nebulization Q4H PRN Amin, Ankit Chirag, MD       levothyroxine (SYNTHROID) tablet 25 mcg  25 mcg Oral Q MTWThF Crosley, Debby, MD   25 mcg at 11/09/22 0651   lidocaine (LIDODERM) 5 % 1 patch  1 patch Transdermal QHS Quintella Baton, MD   1 patch at  11/09/22 0917   lip balm (CARMEX) ointment 1 Application  1 Application Topical PRN Geradine Girt, DO   1 Application at 78/46/96 2221   LORazepam (ATIVAN) tablet 0.5 mg  0.5 mg Oral Daily PRN Rosezella Rumpf, NP   0.5 mg at 11/07/22 0825   methylPREDNISolone sodium succinate (SOLU-MEDROL) 125 mg/2 mL injection 120 mg  120 mg Intravenous Q12H Volanda Napoleon, MD   120 mg at 11/09/22 1215   metoprolol tartrate (LOPRESSOR) injection 5 mg  5 mg Intravenous Q4H PRN Amin, Jeanella Flattery, MD       multivitamin with minerals tablet 1 tablet  1 tablet Oral Daily Florencia Reasons, MD   1 tablet at 11/08/22 1017   ondansetron (ZOFRAN) tablet 4 mg  4 mg Oral Q8H PRN Florencia Reasons, MD       oxybutynin (DITROPAN) tablet 5 mg  5 mg Oral Daily Crosley, Debby, MD   5 mg at 11/09/22 2952   oxyCODONE (Oxy IR/ROXICODONE) immediate release tablet 5 mg  5 mg Oral Q4H PRN Amin, Jeanella Flattery, MD       senna-docusate (Senokot-S) tablet 1 tablet  1 tablet Oral QHS Eulogio Bear U, DO   1 tablet at 11/08/22 2106   senna-docusate (Senokot-S) tablet 1 tablet  1 tablet Oral QHS PRN Amin, Ankit Chirag, MD       sodium zirconium cyclosilicate (LOKELMA) packet 10 g  10 g Oral TID Amin, Ankit Chirag, MD   10 g at 11/09/22 1517   traZODone (DESYREL) tablet 50 mg  50 mg Oral QHS PRN Amin, Jeanella Flattery, MD        REVIEW OF SYSTEMS:  A 10+ POINT REVIEW OF SYSTEMS WAS OBTAINED including neurology, dermatology, psychiatry, cardiac, respiratory, lymph, extremities, GI, GU, musculoskeletal, constitutional, reproductive, HEENT.    PHYSICAL EXAM: Lying comfortably in a hospital bed In general this is a well appearing female in no acute distress. She's alert and oriented x4 and appropriate throughout the examination. Cardiopulmonary assessment is negative for acute distress and she exhibits normal effort. Bilateral sensation intact in lower extremities.   ECOG = 3  0 - Asymptomatic (Fully active, able to carry on all predisease activities  without restriction)  1 - Symptomatic but completely ambulatory (Restricted in physically strenuous activity but ambulatory and able to carry out work of a light or sedentary nature. For example, light housework, office work)  2 - Symptomatic, <50% in bed during the day (Ambulatory and capable of all self care but unable to carry out any work activities. Up and about more than 50% of waking hours)  3 - Symptomatic, >50% in bed, but not bedbound (Capable of only limited self-care, confined to bed or chair 50% or more of waking hours)  4 - Bedbound (Completely disabled. Cannot carry on any self-care. Totally confined to bed or chair)  5 - Death   Eustace Pen MM, Creech RH, Tormey DC, et al. [phone]). "Toxicity and response criteria of the [facility]". [place] Oncol. 5 (6): 649-55  LABORATORY DATA:  Lab Results  Component Value Date   WBC 5.7 11/09/2022   HGB 11.0 (L) 11/09/2022   HCT 37.4 11/09/2022   MCV 98.2 11/09/2022   PLT 118 (L) 11/09/2022   NEUTROABS 5.8 11/08/2022   Lab Results  Component Value Date   NA 136 11/09/2022   K 5.7 (H) 11/09/2022   CL 107 11/09/2022  CO2 21 (L) 11/09/2022   GLUCOSE 196 (H) 11/09/2022   BUN 70 (H) 11/09/2022   CREATININE 2.09 (H) 11/09/2022   CALCIUM 6.4 (LL) 11/09/2022      RADIOGRAPHY: DG Chest 2 View  Result Date: 11/07/2022 CLINICAL DATA:  Follow-up pneumonia EXAM: CHEST - 2 VIEW COMPARISON:  Chest x-ray 11/05/2022 FINDINGS: Heart is enlarged. There is no focal lung infiltrate, pleural effusion or pneumothorax. There is linear atelectasis in the left mid lung. There is calcified atherosclerotic disease throughout the aorta. No acute fractures are seen. IMPRESSION: 1. No active cardiopulmonary disease. 2. Cardiomegaly. Electronically Signed   By: Ronney Asters M.D.   On: 11/07/2022 15:26   DG Chest Port 1 View  Result Date: 11/05/2022 CLINICAL DATA:  Shortness of breath. EXAM: PORTABLE CHEST 1 VIEW COMPARISON:   November 04, 2022 FINDINGS: The cardiac silhouette is enlarged and unchanged in size. There is marked severity calcification of the aortic arch. Patchy, multifocal airspace disease is again seen throughout both lungs, with stable atelectatic changes seen within the mid left lung. There is no evidence of a pleural effusion or pneumothorax. Multilevel degenerative changes seen throughout the thoracic spine. IMPRESSION: 1. Cardiomegaly with stable bilateral, multifocal airspace disease. Electronically Signed   By: Virgina Norfolk M.D.   On: 11/05/2022 02:48   DG Chest 2 View  Result Date: 11/04/2022 CLINICAL DATA:  Pneumonia.  Currently on antibiotics. EXAM: CHEST - 2 VIEW COMPARISON:  November 02, 2022 FINDINGS: The heart size and mediastinal contours are stable. The heart size is enlarged. Patchy consolidation noted throughout both lungs, improved in the right mid and lung base. Small bilateral pleural effusions are noted. The visualized skeletal structures are stable. IMPRESSION: Patchy consolidation noted throughout both lungs, improved in the right mid and lung base. Electronically Signed   By: Abelardo Diesel M.D.   On: 11/04/2022 10:31   DG CHEST PORT 1 VIEW  Result Date: 11/02/2022 CLINICAL DATA:  Pleural effusion EXAM: PORTABLE CHEST 1 VIEW COMPARISON:  11/01/2022 FINDINGS: There is developing consolidation throughout the right lung, likely infectious or inflammatory in the acute setting. Innumerable pulmonary masses are again identified though are partially obscured within the right lung by developing pulmonary infiltrate. No pneumothorax or pleural effusion. Cardiac size is mildly enlarged, unchanged. Central pulmonary arteries are enlarged, unchanged. No acute bone abnormality. IMPRESSION: 1. Developing consolidation throughout the right lung, likely infectious or inflammatory in the acute setting. 2. Innumerable pulmonary metastases again identified. Electronically Signed   By: Fidela Salisbury M.D.    On: 11/02/2022 13:45   DG Chest 2 View  Result Date: 11/01/2022 CLINICAL DATA:  Shortness of breath. EXAM: CHEST - 2 VIEW COMPARISON:  Chest CT 10/25/2022. FINDINGS: Prominent cardiopericardial silhouette is unchanged. Stable tortuosity and calcification of the thoracic aorta. Diffuse pulmonary metastatic disease again demonstrated. Enlarging bilateral pleural effusions and overlying atelectasis. No pulmonary infiltrates. IMPRESSION: 1. Diffuse pulmonary metastatic disease. 2. Enlarging bilateral pleural effusions and overlying atelectasis. Electronically Signed   By: Marijo Sanes M.D.   On: 11/01/2022 08:52   US BIOPSY (LIVER)  Result Date: 10/30/2022 INDICATION: Multiple lung nodules with liver lesion.  No diagnosis. EXAM: ULTRASOUND GUIDED LIVER LESION BIOPSY COMPARISON:  CT AP 10/25/2022.  Limited US Abdomen, 10/28/2022. MEDICATIONS: None ANESTHESIA/SEDATION: Moderate (conscious) sedation was employed during this procedure. A total of Versed 1.5 mg and Fentanyl 50 mcg was administered intravenously. Moderate Sedation Time: 39 minutes. The patient's level of consciousness and vital signs were monitored continuously by radiology nursing throughout  the procedure under my direct supervision. COMPLICATIONS: None immediate. PROCEDURE: Informed written consent was obtained from the patient after a discussion of the risks, benefits and alternatives to treatment. The patient understands and consents the procedure. A timeout was performed prior to the initiation of the procedure. Ultrasound scanning was performed of the right upper abdominal quadrant demonstrates RIGHT hepatic lobe mass The RIGHT hepatic lobe mass was selected for biopsy and the procedure was planned. The right upper abdominal quadrant was prepped and draped in the usual sterile fashion. The overlying soft tissues were anesthetized with 1% lidocaine with epinephrine. A 17 gauge, 6.8 cm co-axial needle was advanced into a peripheral aspect of  the lesion. This was followed by 3 core biopsies with an 18 gauge core device under direct ultrasound guidance. The coaxial needle tract was embolized with a small amount of Gel-Foam slurry and superficial hemostasis was obtained with manual compression. Post procedural scanning was negative for definitive area of hemorrhage or additional complication. A dressing was placed. The patient tolerated the procedure well without immediate post procedural complication. IMPRESSION: Successful ultrasound guided core needle biopsy of RIGHT hepatic lobe mass. PLAN: Extremely challenging liver lesion Bx, secondary to liver echogenicity. If sample inadequate, then consider CT-guided Bx for additional tissue. Michaelle Birks, MD [facility] Electronically Signed   By: Michaelle Birks M.D.   On: 10/30/2022 17:42   US Abdomen Limited RUQ (LIVER/GB)  Result Date: 10/28/2022 CLINICAL DATA:  87 year old female with metastatic disease, cirrhosis and possible hepatic mass identified on recent noncontrast CT. EXAM: ULTRASOUND ABDOMEN LIMITED RIGHT UPPER QUADRANT COMPARISON:  10/25/2022 CT and prior studies FINDINGS: Gallbladder: Distended gallbladder with sludge noted. There is no evidence of cholelithiasis, gallbladder wall thickening, pericholecystic fluid or sonographic Murphy sign. Common bile duct: Diameter: 3.5 mm. There is no evidence of intrahepatic or extrahepatic biliary dilatation. Liver: Nodular hepatic contour is compatible with cirrhosis. The liver is heterogeneous. A 2.3 x 3.2 x 2 cm focal hyperechoic area/mass within the RIGHT liver is noted. No other definite focal masses within the liver noted. Portal vein is patent on color Doppler imaging with normal direction of blood flow towards the liver. Other: None. IMPRESSION: 1. Cirrhosis. 2. 2.3 x 3.2 x 2 cm focal hyperechoic area/mass within the RIGHT liver, suspicious for metastasis or hepatoma given patient history.  MRI may be helpful for further evaluation as clinically indicated. 3. Distended gallbladder with sludge. No evidence of acute cholecystitis. 4. No biliary dilatation. Electronically Signed   By: Margarette Canada M.D.   On: 10/28/2022 18:15   MR BRAIN W WO CONTRAST  Result Date: 10/25/2022 CLINICAL DATA:  Recurrent kidney cancer, staging EXAM: MRI HEAD WITHOUT AND WITH CONTRAST TECHNIQUE: Multiplanar, multiecho pulse sequences of the brain and surrounding structures were obtained without and with intravenous contrast. CONTRAST:  8.71m GADAVIST GADOBUTROL 1 MMOL/ML IV SOLN COMPARISON:  03/30/2021 FINDINGS: Brain: No abnormal parenchymal or meningeal enhancement. No restricted diffusion to suggest acute or subacute infarct No acute hemorrhage, mass, mass effect, or midline shift. No hydrocephalus or extra-axial collection. Cerebral volume is within normal limits for age. Scattered T2 hyperintense signal in the periventricular white matter, likely the sequela of mild chronic small vessel ischemic disease. Remote lacunar infarct in left thalamus. Vascular: Normal arterial flow voids. Skull and upper cervical spine: Normal marrow signal. No abnormal osseous enhancement. Sinuses/Orbits: Mucous retention cyst in the right maxillary sinus. The orbits are unremarkable. Other: The mastoids are well aerated. IMPRESSION: No acute intracranial  process. No evidence of metastatic disease in the brain. Electronically Signed   By: Merilyn Baba M.D.   On: 10/25/2022 19:58   CT CHEST WO CONTRAST  Result Date: 10/25/2022 CLINICAL DATA:  Renal mass/cyst; indeterminate pathologic fracture with possible metastasis; assess for malignancy in the abdomen history of renal cancer EXAM: CT CHEST AND ABDOMEN WITHOUT CONTRAST TECHNIQUE: Multidetector CT imaging of the chest and abdomen was performed following the standard protocol without intravenous contrast. RADIATION DOSE REDUCTION: This exam was performed according to the departmental  dose-optimization program which includes automated exposure control, adjustment of the mA and/or kV according to patient size and/or use of iterative reconstruction technique. COMPARISON:  CT 07/02/2019 and MRI lumbar spine 11/02/2022; CT pelvis and L-spine 10/23/2022; CT chest 01/31/2022 FINDINGS: CT CHEST FINDINGS WITHOUT CONTRAST Cardiovascular: Aortic and coronary artery atherosclerotic calcification. Trace pericardial effusion. Borderline cardiomegaly. Mediastinum/Nodes: There are a few prominent subcentimeter mediastinal and hilar lymph nodes. For example 9 mm pretracheal node (2/19). These are unchanged from 01/31/2022. Unremarkable esophagus. Normal thyroid. Patent trachea. Lungs/Pleura: Innumerable bilateral pulmonary nodules measuring up to 4.1 cm in the left upper lobe compatible with metastases. These are new from 01/23/2022. Trace left pleural effusion. Bibasilar atelectasis/scarring. No pneumothorax. Musculoskeletal: No chest wall mass or suspicious bone lesions identified. CT ABDOMEN FINDINGS WITHOUT CONTRAST Hepatobiliary: Shrunken nodular contour of the liver compatible with cirrhosis. Poorly defined area of hypoattenuation in the posterior right hepatic lobe measuring approximately 7.7 cm. Additional 1.3 cm focus of hypoattenuation in the left hepatic lobe. These are incompletely evaluated without IV contrast however are concerning for metastases. Distended gallbladder. Punctate stone in the gallbladder neck measuring 2 mm. No biliary dilation. Pancreas: Unremarkable. Spleen: Unremarkable. Adrenals/Urinary Tract: Unchanged low-density nodules in both adrenal glands and should be benign. Cortical scarring right kidney. No definite right renal mass on noncontrast exam. Prominent right extrarenal pelvis. No hydronephrosis. No urinary calculi visualized. Left nephrectomy. No definite soft tissue mass in the nephrectomy bed to suggest recurrence. Stomach/Bowel: Unremarkable stomach. The visualized small  bowel and colon is normal in caliber. Vascular/Lymphatic: Advanced aortic and iliac atherosclerotic calcification. Other: Small amount of free fluid in the left pericolic gutter. Indeterminate mesenteric stranding in the left hemiabdomen. Musculoskeletal: Redemonstrated presumed pathologic compression fracture of L4 better evaluated on MRI 10/14/2022. No additional acute osseous abnormality or aggressive osseous lesions. IMPRESSION: 1. Innumerable bilateral pulmonary metastases. 2. Hepatic cirrhosis with areas of hypoattenuation in both hepatic lobes concerning for metastases. These are incompletely evaluated without IV contrast. Consider CT or MRI with IV contrast for further evaluation. 3. Left nephrectomy. 4. Small amount of free fluid in the left pericolic gutter and mesenteric stranding in the left hemiabdomen. This could be related to portal hypertension however continued attention on follow-up is recommended. 5. Cholelithiasis. 6. Redemonstrated presumed pathologic fracture of L4, better evaluated on prior MRI. 7.  Aortic Atherosclerosis (ICD10-I70.0). Electronically Signed   By: Placido Sou M.D.   On: 10/25/2022 11:43   CT ABDOMEN WO CONTRAST  Result Date: 10/25/2022 CLINICAL DATA:  Renal mass/cyst; indeterminate pathologic fracture with possible metastasis; assess for malignancy in the abdomen history of renal cancer EXAM: CT CHEST AND ABDOMEN WITHOUT CONTRAST TECHNIQUE: Multidetector CT imaging of the chest and abdomen was performed following the standard protocol without intravenous contrast. RADIATION DOSE REDUCTION: This exam was performed according to the departmental dose-optimization program which includes automated exposure control, adjustment of the mA and/or kV according to patient size and/or use of iterative reconstruction technique. COMPARISON:  CT 07/02/2019  and MRI lumbar spine 10/23/2022; CT pelvis and L-spine 10/16/2022; CT chest 01/31/2022 FINDINGS: CT CHEST FINDINGS WITHOUT  CONTRAST Cardiovascular: Aortic and coronary artery atherosclerotic calcification. Trace pericardial effusion. Borderline cardiomegaly. Mediastinum/Nodes: There are a few prominent subcentimeter mediastinal and hilar lymph nodes. For example 9 mm pretracheal node (2/19). These are unchanged from 01/31/2022. Unremarkable esophagus. Normal thyroid. Patent trachea. Lungs/Pleura: Innumerable bilateral pulmonary nodules measuring up to 4.1 cm in the left upper lobe compatible with metastases. These are new from 01/23/2022. Trace left pleural effusion. Bibasilar atelectasis/scarring. No pneumothorax. Musculoskeletal: No chest wall mass or suspicious bone lesions identified. CT ABDOMEN FINDINGS WITHOUT CONTRAST Hepatobiliary: Shrunken nodular contour of the liver compatible with cirrhosis. Poorly defined area of hypoattenuation in the posterior right hepatic lobe measuring approximately 7.7 cm. Additional 1.3 cm focus of hypoattenuation in the left hepatic lobe. These are incompletely evaluated without IV contrast however are concerning for metastases. Distended gallbladder. Punctate stone in the gallbladder neck measuring 2 mm. No biliary dilation. Pancreas: Unremarkable. Spleen: Unremarkable. Adrenals/Urinary Tract: Unchanged low-density nodules in both adrenal glands and should be benign. Cortical scarring right kidney. No definite right renal mass on noncontrast exam. Prominent right extrarenal pelvis. No hydronephrosis. No urinary calculi visualized. Left nephrectomy. No definite soft tissue mass in the nephrectomy bed to suggest recurrence. Stomach/Bowel: Unremarkable stomach. The visualized small bowel and colon is normal in caliber. Vascular/Lymphatic: Advanced aortic and iliac atherosclerotic calcification. Other: Small amount of free fluid in the left pericolic gutter. Indeterminate mesenteric stranding in the left hemiabdomen. Musculoskeletal: Redemonstrated presumed pathologic compression fracture of L4 better  evaluated on MRI 10/26/2022. No additional acute osseous abnormality or aggressive osseous lesions. IMPRESSION: 1. Innumerable bilateral pulmonary metastases. 2. Hepatic cirrhosis with areas of hypoattenuation in both hepatic lobes concerning for metastases. These are incompletely evaluated without IV contrast. Consider CT or MRI with IV contrast for further evaluation. 3. Left nephrectomy. 4. Small amount of free fluid in the left pericolic gutter and mesenteric stranding in the left hemiabdomen. This could be related to portal hypertension however continued attention on follow-up is recommended. 5. Cholelithiasis. 6. Redemonstrated presumed pathologic fracture of L4, better evaluated on prior MRI. 7.  Aortic Atherosclerosis (ICD10-I70.0). Electronically Signed   By: Placido Sou M.D.   On: 10/25/2022 11:43   MR LUMBAR SPINE WO CONTRAST  Result Date: 10/11/2022 CLINICAL DATA:  Low back pain, fracture on same-day CT EXAM: MRI LUMBAR SPINE WITHOUT CONTRAST TECHNIQUE: Multiplanar, multisequence MR imaging of the lumbar spine was performed. No intravenous contrast was administered. COMPARISON:  10/04/2022 CT lumbar spine, correlation is also made with 07/02/2019 CT abdomen pelvis FINDINGS: Segmentation:  5 lumbar type vertebral bodies. Alignment:  Mild levocurvature.  Trace anterolisthesis of L4 on L5. Vertebrae: Diffusely decreased T1 and diffusely increased T2 hyperintense signal throughout the L4 vertebral body but most prominent towards the posterior aspect, extending into the bilateral pedicles. Approximately 30% vertebral body height loss, with bowing of the posterior cortex, which extends approximately 7 mm into the spinal canal (series 6, image 8), concerning for pathologic fracture. Otherwise normal marrow signal and preserved vertebral body heights. Conus medullaris and cauda equina: Conus extends to the L1 level. Conus and cauda equina appear normal. Paraspinal and other soft tissues: Status post  left nephrectomy. Right cortical atrophy and scarring. Atrophy of the paraspinous musculature. Disc levels: T12-L1: No significant disc bulge. No spinal canal stenosis or neural foraminal narrowing. L1-L2: Minimal disc bulge with left foraminal protrusion. Mild facet arthropathy. No spinal canal stenosis or neural  foraminal narrowing. L2-L3: Minimal disc bulge with left foraminal protrusion. Mild facet arthropathy. No spinal canal stenosis or neural foraminal narrowing. L3-L4: No significant disc bulge. Bowing of the posterior cortex of L4 causes moderate spinal canal stenosis just below the disc level. Narrowing of the lateral recesses. No significant neural foraminal narrowing. L4-L5: Minimal disc bulge. Moderate to severe facet arthropathy. No spinal canal stenosis. Mild left neural foraminal narrowing. L5-S1: Minimal disc bulge. Mild right and moderate left facet arthropathy. Narrowing of the left lateral recess. No spinal canal stenosis. Mild right neural foraminal narrowing. IMPRESSION: 1. Findings concerning for pathologic fracture of the L4 vertebral body, with approximately 30% vertebral body height loss and bowing of the posterior cortex, which causes moderate spinal canal stenosis posterior to the L4 vertebral body, as well as mild left neural foraminal narrowing at L4-L5. Given the patient's history of renal cell carcinoma, this is concerning for metastasis. 2. L5-S1 mild right neural foraminal narrowing. Narrowing of the left lateral recess at this level could affect the descending left S1 nerve roots. These results were called by telephone at the time of interpretation on 10/20/2022 at 11:41 pm to provider DAVID [facility], who verbally acknowledged these results. Electronically Signed   By: Merilyn Baba M.D.   On: 10/31/2022 23:41   CT Hip Left Wo Contrast  Result Date: 10/09/2022 CLINICAL DATA:  Hip trauma, fracture suspected. EXAM: CT OF THE LEFT HIP WITHOUT CONTRAST TECHNIQUE: Multidetector CT  imaging of the left hip was performed according to the standard protocol. Multiplanar CT image reconstructions were also generated. RADIATION DOSE REDUCTION: This exam was performed according to the departmental dose-optimization program which includes automated exposure control, adjustment of the mA and/or kV according to patient size and/or use of iterative reconstruction technique. COMPARISON:  None Available. FINDINGS: Bones/Joint/Cartilage Status post left hip cephalomedullary nail placement. The hardware is intact. No perihardware fracture. There is a small fracture about the posterior acetabular wall of indeterminate age. Ligaments Suboptimally assessed by CT. Muscles and Tendons No intramuscular hematoma or fluid collection. Soft tissues Skin and subcutaneous soft tissues are within normal limits. Prominent vascular calcifications. IMPRESSION: 1. Status post left hip cephalomedullary nail placement. The hardware is intact. No perihardware fracture. 2. Small fracture about the posterior acetabular wall of indeterminate age. 3. No intramuscular hematoma or fluid collection. Electronically Signed   By: Keane Police D.O.   On: 10/16/2022 21:05   CT Lumbar Spine Wo Contrast  Result Date: 10/21/2022 CLINICAL DATA:  Low back pain, trauma. EXAM: CT LUMBAR SPINE WITHOUT CONTRAST TECHNIQUE: Multidetector CT imaging of the lumbar spine was performed without intravenous contrast administration. Multiplanar CT image reconstructions were also generated. RADIATION DOSE REDUCTION: This exam was performed according to the departmental dose-optimization program which includes automated exposure control, adjustment of the mA and/or kV according to patient size and/or use of iterative reconstruction technique. COMPARISON:  Radiographs dated October 13, 2022 FINDINGS: Segmentation: 5 lumbar type vertebrae. Alignment: Grade 1 anterolisthesis of L4, chronic process. Vertebrae: Compression fracture of the L4 vertebral body.  There is a proximally 20% vertebral body height loss. No significant retropulsion of the vertebral body into the spinal canal. Paraspinal and other soft tissues: Soft tissue swelling and hematoma about the vertebral body. Postsurgical changes for prior left nephrectomy. Advanced atherosclerotic disease of abdominal aorta and bilateral iliac vessels. Disc levels: Advanced multilevel degenerate disc disease with associated facet joint arthropathy. T12-L1: No significant spinal canal or neural foraminal stenosis. L1-L2: Disc osteophyte complex with mild bilateral lateral  recess stenosis. Mild facet joint arthropathy. No significant neural foraminal stenosis. L2-L3: Disc osteophyte complex with bilateral lateral recess stenosis. Facet joint arthropathy. No significant neural foraminal stenosis. L3-L4: Mild retropulsion of the superior endplate of the L4 vertebral body into the spinal canal. Moderate bilateral lateral recess stenosis. Mild bilateral neural foraminal stenosis. L4-L5: Disc osteophyte complex with bilateral lateral recess stenosis. Mild to moderate bilateral neural foraminal stenosis, left worse than the right. Moderate bilateral facet joint arthropathy. L5-S1: Disc osteophyte complex with moderate lateral recess stenosis. Moderate-to-severe bilateral facet joint arthropathy. IMPRESSION: 1. Acute compression fracture of the L4 vertebral body with approximately 20% vertebral body height loss. Mild retropulsion of the superior endplate into the spinal canal. Further evaluation with MR examination is suggested. 2. Advanced multilevel degenerate disc disease and facet joint arthropathy as described above. 3. Aortic atherosclerosis. Aortic Atherosclerosis (ICD10-I70.0). Electronically Signed   By: Keane Police D.O.   On: 10/12/2022 20:56   CT PELVIS WO CONTRAST  Result Date: 10/20/2022 CLINICAL DATA:  Hip trauma, fracture suspected.  Low back pain. EXAM: CT PELVIS WITHOUT CONTRAST TECHNIQUE: Multidetector  CT imaging of the pelvis was performed following the standard protocol without intravenous contrast. RADIATION DOSE REDUCTION: This exam was performed according to the departmental dose-optimization program which includes automated exposure control, adjustment of the mA and/or kV according to patient size and/or use of iterative reconstruction technique. COMPARISON:  10/23/2022. FINDINGS: Urinary Tract:  No abnormality visualized. Bowel:  No acute abnormality. Vascular/Lymphatic: Atherosclerotic calcification of the distal abdominal aorta and its tributaries. No abdominal or pelvic lymphadenopathy. Reproductive:  Hysterectomy changes are noted. Other: A small amount of free fluid is present in the pelvis. Small fat containing umbilical hernia. Musculoskeletal: No acute fracture or dislocation at the left hip. Fixation hardware is present in the proximal left femur without evidence of hardware loosening. A compression fracture is noted in the superior endplate of L4, partially visualized on exam. Degenerative changes are noted in the lower lumbar spine. IMPRESSION: 1. No acute fracture or dislocation at the left hip. 2. Compression deformity in the superior endplate of L4, partially visualized on exam. Please refer to CT lumbar spine for additional information. 3. Aortic atherosclerosis. Electronically Signed   By: Brett Fairy M.D.   On: 10/04/2022 20:44   DG Hip Unilat W or Wo Pelvis 2-3 Views Left  Result Date: 10/29/2022 CLINICAL DATA:  Left hip pain. Weakness. Fall 1 month ago. EXAM: DG HIP (WITH OR WITHOUT PELVIS) 2-3V LEFT COMPARISON:  None Available. FINDINGS: Intramedullary nail with trans trochanteric and distal locking screw fixation of remote proximal femur fracture. There is no periprosthetic fracture or lucency. Femoral head is well seated, no dislocation. Intact pubic rami. Pubic symphysis and sacroiliac joints are congruent. The bones are under mineralized. There are vascular calcifications  IMPRESSION: 1. No acute fracture or dislocation of the left hip. 2. Hardware in place without complication. Electronically Signed   By: Keith Rake M.D.   On: 10/09/2022 16:14   DG Lumbar Spine Complete  Result Date: 10/13/2022 CLINICAL DATA:  Fall in bathtub with lower back pain EXAM: [place] 4+ VIEW COMPARISON:  Radiographs 09/27/2009 FINDINGS: Demineralization. Grade 1 anterolisthesis of L4 is new since 2010 but is presumed chronic. Vertebral body height is maintained. Mild multilevel spondylosis. Mild disc space height loss at L3-L4 and L4-L5. Moderate lower lumbar facet arthropathy. Calcified splenic artery and aorta.  Surgical clips in the abdomen. IMPRESSION: Demineralization.  No definite acute fracture. Grade 1 anterolisthesis  of L4 on L5. Electronically Signed   By: Placido Sou M.D.   On: 10/13/2022 00:57      IMPRESSION: Metastatic hepatocellular carcinoma with osseous metastasis to L4 and pulmonary metastases: non-biopsy proven but suspected based on alpha-fetoprotein levels   Patient's compression fracture is consistent with osseous metastasis from hepatocellular carcinoma. Patient is a good candidate for palliative radiation to the spine and will likely find improvement in symptoms from treatment.   Today, I talked to the patient  about the findings and work-up thus far.  We discussed the natural history of hepatocellular carcinoma with osseous metastasis to the spine and general treatment, highlighting the role of radiotherapy in the management.  We discussed the available radiation techniques, and focused on the details of logistics and delivery.  We reviewed the anticipated acute and late sequelae associated with radiation in this setting.  The patient was encouraged to ask questions that I answered to the best of my ability.  A patient consent form was discussed and signed.  We retained a copy for our records.  The patient would like to proceed with radiation and  will be scheduled for CT simulation.  PLAN: CT simulation today. Plan to deliver palliative radiation therapy to the lumbar spine beginning tomorrow.  Anticipate 10 fractions.   60 minutes of total time was spent for this patient encounter, including preparation, face-to-face counseling with the patient and coordination of care, physical exam, and documentation of the encounter.   ------------------------------------------------   Leona Singleton, PA   Blair Promise, PhD, MD  This document serves as a record of services personally performed by Gery Pray, MD. It was created on his behalf by Roney Mans, a trained medical scribe. The creation of this record is based on the scribe's personal observations and the provider's statements to them. This document has been checked and approved by the attending provider.

## 2022-11-09 NOTE — Progress Notes (Signed)
Nutrition Follow-up  DOCUMENTATION CODES:   Not applicable  INTERVENTION:  - Continue Regular diet to provide widest variety of menu options to enhance nutritional adequacy . - Ensure Enlive po TID, each supplement provides 350 kcal and 20 grams of protein. - Encourage intake. - Monitor weight trends.   NUTRITION DIAGNOSIS:   Increased nutrient needs related to acute illness, chronic illness as evidenced by estimated needs. *ongoing  GOAL:   Patient will meet greater than or equal to 90% of their needs *progressing  MONITOR:   PO intake, Supplement acceptance, Labs, Weight trends  REASON FOR ASSESSMENT:   Consult Assessment of nutrition requirement/status  ASSESSMENT:   Pt admitted with intractable back pain. PMH significant for chronic respiratory failure, CHF, renal cell carcinoma s/p L nephrectomy (2000), liver cirrhosis, diabetes.  Patient resting in bed at time of visit. She endorses she hasn't been eating as well as she normally would due to decreased appetite and not liking available hospital food.  However, despite pt's report she is documented to be consuming an average of 66% of meals. Thankfully, she is also regularly drinking her Ensure and reports she continues to enjoy the supplements.   Discussed importance of trying to eat 3 meals a day and continuing to consume supplements to aid in meeting increased nutritional needs during admission. Patient states her daughter can hopefully bring her in some food soon but that she is sick and cannot visit for a few days. However, grandson coming to visit and could bring food.  Weight noted to be increased from admit weight however patient experiencing edema which may be affecting current weight status.   Medications reviewed and include: Colace, Insulin, Synthroid, MVI, Senokot   Labs reviewed:  K+ 6.1 Creatinine 2.09 Mg 3.2 HA1C 6.4 Blood Glucose 180-282 x24 hours  Diet Order:   Diet Order              Diet NPO time specified Except for: Sips with Meds  Diet effective midnight           Diet regular Room service appropriate? Yes; Fluid consistency: Thin  Diet effective now                   EDUCATION NEEDS:  Education needs have been addressed  Skin:  Skin Assessment: Reviewed RN Assessment  Last BM:  12/5  Height:  Ht Readings from Last 1 Encounters:  10/18/2022 _0  (1.6 m)   Weight:  Wt Readings from Last 1 Encounters:  11/09/22 91.6 kg    BMI:  Body mass index is 35.77 kg/m.  Estimated Nutritional Needs:  Kcal:  1800-2000 Protein:  90-105g Fluid:  >/=1.[place] RD, LDN For contact information, refer to [facility].

## 2022-11-09 NOTE — Progress Notes (Signed)
Physical Therapy Treatment Patient Details Name: Margaret Dyer MRN: [id number] DOB: 1935-04-10 Today's Date: 11/09/2022   History of Present Illness 87 yo female presents to [facility] on 11/21 with weakness, back/L hip pain, fall x1 month ago.  Pt found to have compression vs pathologic fracture of L4, L acetabular fx. CT scan of the chest/abdomen/pelvis shows multiple bilat pulmonary mets, hepatic mets. S/p liver mass biopsy 11/27, plan for KP on 11/30 but pt too ShOB, CXR shows R PNA. PMH significant for chronic respiratory failure on 3.5LO2 at baseline, DM2, HTN, HLD, chronic diastolic CHF, pulmonary hypertension, liver cirrhosis, CKD 3, renal cell carcinoma with left nephrectomy 2000, hypothyroidism, osteopenia, L hip IMN 2020.    PT Comments    General Comments: AxO x 3 very pleasant Lady but feeling "really tired" today and asking for water.  Pt currently NPO for [facility] Placement later. Pt in bed on 4 lts nasal sats 96%.  Assisted to EOB required increased effort and time.  General transfer comment: pt prefers to "pull up" from walker vs push up from bed.  Required Mod Asisst to partially rise.  Increased back pain with activity.  General Gait Details: pt was onlt able to take a few side steps along the bed to postion self to [facility].  "My legs feel weak", stated pt. Assisted back to bed and positioned supine with pillows under B knees for back comfort. Pt will need ST Rehab at SNF to address mobility and functional decline prior to safely returning home.    Recommendations for follow up therapy are one component of a multi-disciplinary discharge planning process, led by the attending physician.  Recommendations may be updated based on patient status, additional functional criteria and insurance authorization.  Follow Up Recommendations  Skilled nursing-short term rehab (<3 hours/day) Can patient physically be transported by private vehicle: Yes   Assistance Recommended at Discharge Frequent or  constant Supervision/Assistance  Patient can return home with the following A lot of help with walking and/or transfers;A lot of help with bathing/dressing/bathroom   Equipment Recommendations  None recommended by PT    Recommendations for Other Services       Precautions / Restrictions Precautions Precautions: Fall;Back Precaution Comments: back Required Braces or Orthoses: Other Brace Other Brace: LSO corset brace Restrictions Weight Bearing Restrictions: Yes LLE Weight Bearing: Partial weight bearing LLE Partial Weight Bearing Percentage or Pounds: none specified, ortho states "PWB with assistance of a walker"     Mobility  Bed Mobility Overal bed mobility: Needs Assistance Bed Mobility: Supine to Sit, Sit to Supine     Supine to sit: HOB elevated, Mod assist Sit to supine: Max assist   General bed mobility comments: assist for log roll, trunk elevation, dense cues needed. Once upright, able to static sit at Mod Indep.   Increased assist for back to bed.    Transfers Overall transfer level: Needs assistance Equipment used: Rolling walker (2 wheels) Transfers: Sit to/from Stand Sit to Stand: Mod assist           General transfer comment: pt prefers to "pull up" from walker vs push up from bed.  Required Mod Asisst to partially rise.  Increased back pain with activity.    Ambulation/Gait Ambulation/Gait assistance: Mod assist Gait Distance (Feet): 1 Feet Assistive device: Rolling walker (2 wheels) Gait Pattern/deviations: Step-to pattern Gait velocity: decreased     General Gait Details: pt was onlt able to take a few side steps along the bed to postion self  to [facility].  "My legs feel weak", stated pt.   Stairs             Wheelchair Mobility    Modified Rankin (Stroke Patients Only)       Balance                                            Cognition Arousal/Alertness: Awake/alert Behavior During Therapy: WFL for tasks  assessed/performed Overall Cognitive Status: Within Functional Limits for tasks assessed                                 General Comments: AxO x 3 very pleasant Lady but feeling "really tired" today and asking for water.  Pt currently NPO for [facility] Placement later.        Exercises      General Comments        Pertinent Vitals/Pain Pain Assessment Pain Assessment: Faces Faces Pain Scale: Hurts even more Pain Location: back Pain Descriptors / Indicators: Discomfort, Shooting, Sharp Pain Intervention(s): Monitored during session, Repositioned    Home Living                          Prior Function            PT Goals (current goals can now be found in the care plan section) Progress towards PT goals: Progressing toward goals    Frequency    Min 2X/week      PT Plan Current plan remains appropriate    Co-evaluation              AM-PAC PT "6 Clicks" Mobility   Outcome Measure  Help needed turning from your back to your side while in a flat bed without using bedrails?: A Lot Help needed moving from lying on your back to sitting on the side of a flat bed without using bedrails?: A Lot Help needed moving to and from a bed to a chair (including a wheelchair)?: A Lot Help needed standing up from a chair using your arms (e.g., wheelchair or bedside chair)?: A Lot Help needed to walk in hospital room?: A Lot Help needed climbing 3-5 steps with a railing? : Total 6 Click Score: 11    End of Session Equipment Utilized During Treatment: Gait belt;Oxygen Activity Tolerance: Patient limited by fatigue;Patient limited by pain Patient left: in bed;with call bell/phone within reach Nurse Communication: Mobility status PT Visit Diagnosis: Other abnormalities of gait and mobility (R26.89);Muscle weakness (generalized) (M62.81)     Time: 1610-9604 PT Time Calculation (min) (ACUTE ONLY): 12 min  Charges:  $Therapeutic Activity: 8-22  mins                    Rica Koyanagi  PTA Acute  Rehabilitation Services Office M-F          [phone] Weekend pager [phone]

## 2022-11-09 NOTE — Progress Notes (Signed)
She will be seen by Radiation Oncology today.  Hopefully, she will start radiation therapy.  She still is having the breathing problems.  She still is anxious.  Real problem right now is her potassium.  Her potassium this morning was 6.1.  Yesterday was 6.5.  Her renal function is slowly deteriorating.  Her BUN is 70 creatinine 2.09.  Calcium is 6.4 with an albumin of 2.9.  Her white cell count is 5.7.  Hemoglobin 11.  Platelet count 118,000.  She cannot have a Port-A-Cath placed until her potassium gets better.  Provide try to tell her that she is not being considered for any chemotherapy.  I told her that if we are going to treat her, we would use immunotherapy.  I explained what immunotherapy was and how it worked.  I explained some of the side effects.  I told her that she would be able to handle immunotherapy.  I am unsure why she has the progressive renal insufficiency and the hyperkalemia.  I know that sometimes we can see this with metastatic cancer.  I just do not know how well she is eating.  I know she needs serious physical therapy.  Unfortunately, I am not sure that she is going to be able to have outpatient radiation therapy.  She has that fracture at L4 which is quite painful.  I think that until we can do a vertebroplasty, is can be hard for her to get around safely and without much pain.  As always, we had a good prayer.  I know that her faith is strong.  I know that she is weary.  Her vital signs show temperature of 98 pulse 64.  Blood pressure 127/53.  Her lungs sound pretty clear bilaterally.  I do not hear any wheezing.  Cardiac exam is regular rate and rhythm.  She has no murmurs.  Abdomen is soft.  Bowel sounds are present.  There is no fluid wave.  There is no palpable liver or spleen tip.  Extremity shows no clubbing, cyanosis or edema.  She does have some symmetric weakness in arms and legs.  Skin exam does show some scattered ecchymoses.  Neurological exam is  nonfocal.  I realize that Ms. Ouk is in a tough spot.  I think that her age is clearly working against her.  She has metastatic hepatocellular carcinoma.  Her alpha-fetoprotein was 89,000.  Again, her renal function is slowing down.  This could certainly contribute to her hyperkalemia.  I did speak to her daughter this afternoon.  I gave her daughter update from my point of view.  I understand the daughter is concerned about having her mother go to a assisted living/rehab center and try to go back and forth for her radiation.  I do think this would be quite challenging and would be somewhat stressful for the patient.  As always, I know she is getting incredible care from everybody up on 6 E.  I do appreciate everybody's compassion and their concern.   [name], MD  [facility] 6:9

## 2022-11-09 NOTE — Progress Notes (Signed)
PROGRESS NOTE    Margaret Dyer  QAS:[id number] DOB: 04-Nov-1935 DOA: 10/10/2022 PCP: Ann Held, DO   Brief Narrative:  87 year old with history of DM2, HTN, HLD, diastolic CHF, pulmonary HTN, cirrhosis, CKD stage IIIa, renal cell carcinoma with left nephrectomy in 2000, hypothyroidism presented to hospital with intractable back pain.  CT scan showed compression fracture of L4 with innumerable bilateral pulmonary metastases, posterior left acetabular wall fracture.  MRI confirmed these findings and patient was admitted to the hospital.  Radiology and oncology were consulted.  IR procedure for kyphoplasty or biopsy was postponed as patient was unable to lay flat.  Patient was transferred to [facility] for radiation treatments   Assessment & Plan:  Principal Problem:   Intractable pain Active Problems:   Hyperlipidemia   Gout, unspecified   Essential hypertension   Asthma   Anxiety and depression   Platelets decreased ([place])   Diabetes mellitus type 2, insulin dependent ([place])   History of nephrectomy   Liver cirrhosis secondary to NASH (HCC)   Thrombocytopenia (HCC)   Hypothyroidism   Chronic hypoxemic respiratory failure (HCC)   Chronic diastolic CHF (congestive heart failure) (HCC)   Pulmonary hypertension (HCC)   Chronic renal disease, stage 4, severely decreased glomerular filtration rate (GFR) between 15-29 mL/min/1.73 square meter (HCC)   OSA (obstructive sleep apnea)   Restrictive lung disease   Pathological fracture   Hepatocellular carcinoma metastatic to lung (HCC)    Intractable back pain with compression fracture of L4 with concern for pathological fracture and metastasis MRI, CT of the pelvis showed compression fracture of L4, pathological with 30% vertebral height loss Interventional radiology consulted for vertebroplasty/ablation and bone biopsy.  Kyphoplasty option currently remains on hold.  Patient transferred to [facility] for radiation treatment.   Pain management. Aspirin on hold.    Innumerable bilateral pulmonary metastasis History of renal cell CA with left nephrectomy CT scan obtained on admission showed innumerable bilateral pulmonary metastases as well as possible liver mets and bone mets.  Oncology suspecting metastatic hepatocellular carcinoma due to elevated AFP.  Plan is for radiation therapy and eventually outpatient immunotherapy.  Will defer these discussions to oncology team. 11/27, underwent liver biopsy -showed cirrhosis but no malignant disease -12/5 per oncology-- tx to [facility] for radiation On steroids started by oncology  Hyperkalemia - Persistent hyperK, Due to renal issues and being on high dose steroids. Will order Lokelma, D50, IV Insulin  Hypocalcemia -Ordered Cal Gluc    CKD stage IV history of renal cell carcinoma with left  nephrectomy Baseline creatinine close to 1.8, now trending up. Will give IVF NS at 75cc/hr x 24 hr. Hold Demadex   PNA Procalcitonin 0.35 x-ray was suggestive of infiltrate, repeat clear.  Patient did receive 5 days of IV meropenem   Posterior left acetabular wall fracture of indeterminate age CT of the left hip showed small fracture of posterior acetabular wall of indeterminate age 72/22, seen by [facility], recommended PWB with assistance of a walker, Follow in office as outpt 3 weeks for repeat xrays   Klebsiella UTI Treated earlier during her hospitalization    Type 2 diabetes mellitus with hypoglycemia A1c 6.4 on 10/25/2022 SSI.  Semglee 9 units daily   COPD /Chronic hypoxic respiratory failure  on home O2 3.5liters Bronchodilators scheduled and as necessary   Essential hypertension Currently on Coreg 12.5 mg twice daily, hydralazine, holding Demadex.  IV as needed   HLD On Zetia and Zocor   Hypothyroidism On  Synthroid 25 mcg daily on weekdays. Continue the same.    Thrombocytopenia, chronic Does has ecchymosis ,but no overt bleeding Management per  hematology oncology Dr. Marin Olp   Failure to thrive Secondary to cancer.  Palliative consult appreciated.   Obesity Estimated body mass index is 37.57 kg/m as calculated from the following:   Height as of this encounter: _0  (1.6 m).   Weight as of this encounter: 96.2 kg.    Code Status: Partial Code   Patient has been seen by palliative care team.  Appreciate their input. PT-SNF  DVT prophylaxis: Subcu heparin Code Status: Partial code-DNI Family Communication: Spoke with the daughter over the phone  Status is: Inpatient Maintain hospital stay for pain control and getting radiation treatments.   Nutritional status    Signs/Symptoms: estimated needs  Interventions: Ensure Enlive (each supplement provides 350kcal and 20 grams of protein), Refer to RD note for recommendations  Body mass index is 35.77 kg/m.         Subjective: Laying in the bed, overall feels tired.  Pain and discomfort in her back as expected from her medical issues.  Examination: Constitutional: Not in acute distress.  Elderly frail Respiratory: Clear to auscultation bilaterally Cardiovascular: Normal sinus rhythm, no rubs Abdomen: Nontender nondistended good bowel sounds Musculoskeletal: No edema noted Skin: No rashes seen Neurologic: CN 2-12 grossly intact.  And nonfocal Psychiatric: Normal judgment and insight. Alert and oriented x 3. Normal mood.   Objective: Vitals:   11/08/22 1938 11/08/22 2300 11/09/22 0500 11/09/22 0629  BP: (!) 96/47 (!) 114/40  (!) 124/49  Pulse: (!) 57 (!) 59  (!) 58  Resp: 18   18  Temp: 98 F (36.7 C)   98 F (36.7 C)  TempSrc: Oral   Oral  SpO2: 98%   99%  Weight:   91.6 kg   Height:        Intake/Output Summary (Last 24 hours) at 11/09/2022 0738 Last data filed at 11/08/2022 1900 Gross per 24 hour  Intake 120 ml  Output 250 ml  Net -130 ml   Filed Weights   11/07/22 0500 11/08/22 0500 11/09/22 0500  Weight: 96.2 kg 91.6 kg 91.6 kg      Data Reviewed:   CBC: Recent Labs  Lab 11/04/22 0254 11/05/22 0207 11/06/22 0408 11/07/22 0419 11/08/22 0604 11/09/22 0639  WBC 4.8 4.6 4.9 3.4* 6.4 5.7  NEUTROABS 2.8 2.9 3.0 2.9 5.8  --   HGB 10.7* 11.0* 10.8* 11.9* 11.0* 11.0*  HCT 35.9* 35.0* 34.2* 38.8 37.1 37.4  MCV 96.2 94.1 93.7 94.2 96.6 98.2  PLT 95* 99* 97* 107* 120* 696*   Basic Metabolic Panel: Recent Labs  Lab 11/04/22 0254 11/05/22 0207 11/06/22 0408 11/07/22 0419 11/09/22 0639  NA 140 141 137 137 136  K 5.1 5.1 5.0 5.4* 6.1*  CL 108 107 109 108 107  CO2 25 23 21* 23 21*  GLUCOSE 137* 166* 111* 232* 196*  BUN 36* 39* 39* 42* 70*  CREATININE 1.72* 1.90* 1.92* 1.95* 2.09*  CALCIUM 7.2* 7.0* 6.6* 6.8* 6.4*  MG  --   --   --   --  3.2*   GFR: Estimated Creatinine Clearance: 21.2 mL/min (A) (by C-G formula based on SCr of 2.09 mg/dL (H)). Liver Function Tests: Recent Labs  Lab 12/02/2022 0358 11/04/22 0254 11/07/22 0419 11/09/22 0639  AST 37 34 29 33  ALT _1 ALKPHOS 122 121 122 106  BILITOT 0.5 0.7 0.6  0.6  PROT 5.8* 5.4* 5.8* 6.1*  ALBUMIN 2.6* 2.5* 2.6* 2.9*   No results for input(s): "LIPASE", "AMYLASE" in the last 168 hours. No results for input(s): "AMMONIA" in the last 168 hours. Coagulation Profile: No results for input(s): "INR", "PROTIME" in the last 168 hours.  Cardiac Enzymes: No results for input(s): "CKTOTAL", "CKMB", "CKMBINDEX", "TROPONINI" in the last 168 hours. BNP (last 3 results) No results for input(s): "PROBNP" in the last 8760 hours. HbA1C: No results for input(s): "HGBA1C" in the last 72 hours. CBG: Recent Labs  Lab 11/07/22 2231 11/08/22 0809 11/08/22 1124 11/08/22 1621 11/08/22 2144  GLUCAP 203* 241* 282* 271* 206*   Lipid Profile: No results for input(s): "CHOL", "HDL", "LDLCALC", "TRIG", "CHOLHDL", "LDLDIRECT" in the last 72 hours. Thyroid Function Tests: No results for input(s): "TSH", "T4TOTAL", "FREET4", "T3FREE", "THYROIDAB" in the  last 72 hours. Anemia Panel: No results for input(s): "VITAMINB12", "FOLATE", "FERRITIN", "TIBC", "IRON", "RETICCTPCT" in the last 72 hours. Sepsis Labs: Recent Labs  Lab 11/10/2022 0358 11/05/22 0207 11/06/22 0408  PROCALCITON 0.45 0.31 0.35    Recent Results (from the past 240 hour(s))  Respiratory (~20 pathogens) panel by PCR     Status: None   Collection Time: 11/06/22  9:14 AM   Specimen: Nasopharyngeal Swab; Respiratory  Result Value Ref Range Status   Adenovirus NOT DETECTED NOT DETECTED Final   Coronavirus 229E NOT DETECTED NOT DETECTED Final    Comment: (NOTE) The Coronavirus on the Respiratory Panel, DOES NOT test for the novel  Coronavirus (2019 nCoV)    Coronavirus HKU1 NOT DETECTED NOT DETECTED Final   Coronavirus NL63 NOT DETECTED NOT DETECTED Final   Coronavirus OC43 NOT DETECTED NOT DETECTED Final   Metapneumovirus NOT DETECTED NOT DETECTED Final   Rhinovirus / Enterovirus NOT DETECTED NOT DETECTED Final   Influenza A NOT DETECTED NOT DETECTED Final   Influenza B NOT DETECTED NOT DETECTED Final   Parainfluenza Virus 1 NOT DETECTED NOT DETECTED Final   Parainfluenza Virus 2 NOT DETECTED NOT DETECTED Final   Parainfluenza Virus 3 NOT DETECTED NOT DETECTED Final   Parainfluenza Virus 4 NOT DETECTED NOT DETECTED Final   Respiratory Syncytial Virus NOT DETECTED NOT DETECTED Final   Bordetella pertussis NOT DETECTED NOT DETECTED Final   Bordetella Parapertussis NOT DETECTED NOT DETECTED Final   Chlamydophila pneumoniae NOT DETECTED NOT DETECTED Final   Mycoplasma pneumoniae NOT DETECTED NOT DETECTED Final    Comment: Performed at [facility], [place]. [address], [place], [place] [address]         Radiology Studies: DG Chest 2 View  Result Date: 11/07/2022 CLINICAL DATA:  Follow-up pneumonia EXAM: CHEST - 2 VIEW COMPARISON:  Chest x-ray 11/05/2022 FINDINGS: Heart is enlarged. There is no focal lung infiltrate, pleural effusion or pneumothorax. There is  linear atelectasis in the left mid lung. There is calcified atherosclerotic disease throughout the aorta. No acute fractures are seen. IMPRESSION: 1. No active cardiopulmonary disease. 2. Cardiomegaly. Electronically Signed   By: Ronney Asters M.D.   On: 11/07/2022 15:26        Scheduled Meds:  acetaminophen  1,000 mg Oral Q6H   albuterol  10 mg Nebulization Once   carvedilol  12.5 mg Oral BID WC   insulin aspart  5 Units Intravenous Once   And   dextrose  1 ampule Intravenous Once   docusate sodium  100 mg Oral BID   ezetimibe  10 mg Oral Daily   And   simvastatin  20 mg Oral q1800   feeding supplement  237 mL Oral TID BM   fentaNYL  1 patch Transdermal Q72H   gabapentin  100 mg Oral QHS   heparin  5,000 Units Subcutaneous Q8H   hydrALAZINE  50 mg Oral Q8H   insulin aspart  0-5 Units Subcutaneous QHS   insulin aspart  0-9 Units Subcutaneous TID WC   insulin glargine-yfgn  9 Units Subcutaneous QHS   levothyroxine  25 mcg Oral Q MTWThF   lidocaine  1 patch Transdermal QHS   methylPREDNISolone (SOLU-MEDROL) injection  120 mg Intravenous Q12H   multivitamin with minerals  1 tablet Oral Daily   oxybutynin  5 mg Oral Daily   senna-docusate  1 tablet Oral QHS   sodium zirconium cyclosilicate  10 g Oral TID   Continuous Infusions:  sodium chloride     sodium chloride     calcium gluconate       LOS: 15 days   Time spent= 35 mins    Byrd Rushlow Arsenio Loader, MD [facility]  If 7PM-7AM, please contact night-coverage  11/09/2022, 7:38 AM

## 2022-11-09 NOTE — Telephone Encounter (Signed)
Message received from pt.'s daughter [name] wanting to speak with Dr. Marin Olp. Call placed back to [name] and [name] states that she is concerned that pt may be transferred back and forth from [facility] to [facility] for radiation d/t pt.'s increased pain.  Informed [name] that I would inform Dr. Marin Olp of her concerns.

## 2022-11-09 NOTE — Progress Notes (Signed)
Held pts scheduled 50 mg apresoline X2 during PM shift d/t low DPB. BP at 2300: 114/40 HR 59 and BP at 0630 124/49 HR 58  Provider notified.

## 2022-11-09 NOTE — Plan of Care (Signed)
  Problem: Clinical Measurements: Goal: Diagnostic test results will improve Outcome: Progressing   Problem: Clinical Measurements: Goal: Ability to maintain clinical measurements within normal limits will improve Outcome: Progressing Goal: Diagnostic test results will improve 11/09/2022 0631 by Lonia Skinner, RN Outcome: Progressing 11/09/2022 0629 by Lonia Skinner, RN Outcome: Progressing   Problem: Pain Managment: Goal: General experience of comfort will improve Outcome: Progressing   Problem: Safety: Goal: Ability to remain free from injury will improve 11/09/2022 0631 by Lonia Skinner, RN Outcome: Progressing 11/09/2022 0629 by Lonia Skinner, RN Outcome: Progressing

## 2022-11-09 NOTE — Progress Notes (Signed)
Repeat K 5.7, still too high for sedation per IR team.  Spoke with attending MD, will recheck BMP tomorrow, IR will proceed with PAC placement tomorrow if K is below 5.   RN notified, Diet order placed, NPO at Friday MN.   Please call IR for questions and concerns.   Armando Gang Terina Mcelhinny PA-C 11/09/2022 12:56 PM

## 2022-11-09 NOTE — Consult Note (Signed)
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 in both adrenal glands and should be benign. Cortical scarring right kidney. No definite right renal mass on noncontrast exam. Prominent right extrarenal pelvis. No hydronephrosis. No urinary calculi visualized. Left nephrectomy. No definite soft tissue mass in the nephrectomy bed to suggest recurrence. Stomach/Bowel: Unremarkable stomach. The visualized small bowel and colon is normal in caliber. Vascular/Lymphatic: Advanced aortic and iliac atherosclerotic calcification. Other: Small amount of free fluid in the left pericolic gutter. Indeterminate mesenteric stranding in the left hemiabdomen. Musculoskeletal: Redemonstrated presumed pathologic compression fracture of L4 better evaluated on MRI 10/13/2022. No additional acute osseous abnormality or aggressive osseous lesions. IMPRESSION:  1. Innumerable bilateral pulmonary metastases. 2. Hepatic cirrhosis with areas of hypoattenuation in both hepatic lobes concerning for metastases. These are incompletely evaluated without IV contrast. Consider CT or MRI with IV contrast for further evaluation. 3. Left nephrectomy. 4. Small amount of free fluid in the left pericolic gutter and mesenteric stranding in the left hemiabdomen. This could be related to portal hypertension however continued attention on follow-up is recommended. 5. Cholelithiasis. 6. Redemonstrated presumed pathologic fracture of L4, better evaluated on prior MRI. 7.  Aortic Atherosclerosis (ICD10-I70.0). Electronically Signed   By: Placido Sou M.D.   On: 10/25/2022 11:43   MR LUMBAR SPINE WO CONTRAST  Result Date: 10/06/2022 CLINICAL DATA:  Low back pain, fracture on same-day CT EXAM: MRI LUMBAR SPINE WITHOUT CONTRAST TECHNIQUE: Multiplanar, multisequence MR imaging of the lumbar spine was performed. No intravenous contrast was administered. COMPARISON:  10/04/2022 CT lumbar spine, correlation is also made with  07/02/2019 CT abdomen pelvis FINDINGS: Segmentation:  5 lumbar type vertebral bodies. Alignment:  Mild levocurvature.  Trace anterolisthesis of L4 on L5. Vertebrae: Diffusely decreased T1 and diffusely increased T2 hyperintense signal throughout the L4 vertebral body but most prominent towards the posterior aspect, extending into the bilateral pedicles. Approximately 30% vertebral body height loss, with bowing of the posterior cortex, which extends approximately 7 mm into the spinal canal (series 6, image 8), concerning for pathologic fracture. Otherwise normal marrow signal and preserved vertebral body heights. Conus medullaris and cauda equina: Conus extends to the L1 level. Conus and cauda equina appear normal. Paraspinal and other soft tissues: Status post left nephrectomy. Right cortical atrophy and scarring. Atrophy of the paraspinous musculature. Disc levels: T12-L1:  No significant disc bulge. No spinal canal stenosis or neural foraminal narrowing. L1-L2: Minimal disc bulge with left foraminal protrusion. Mild facet arthropathy. No spinal canal stenosis or neural foraminal narrowing. L2-L3: Minimal disc bulge with left foraminal protrusion. Mild facet arthropathy. No spinal canal stenosis or neural foraminal narrowing. L3-L4: No significant disc bulge. Bowing of the posterior cortex of L4 causes moderate spinal canal stenosis just below the disc level. Narrowing of the lateral recesses. No significant neural foraminal narrowing. L4-L5: Minimal disc bulge. Moderate to severe facet arthropathy. No spinal canal stenosis. Mild left neural foraminal narrowing. L5-S1: Minimal disc bulge. Mild right and moderate left facet arthropathy. Narrowing of the left lateral recess. No spinal canal stenosis. Mild right neural foraminal narrowing. IMPRESSION: 1. Findings concerning for pathologic fracture of the L4 vertebral body, with approximately 30% vertebral body height loss and bowing of the posterior cortex, which causes moderate spinal canal stenosis posterior to the L4 vertebral body, as well as mild left neural foraminal narrowing at L4-L5. Given the patient's history of renal cell carcinoma, this is concerning for metastasis. 2. L5-S1 mild right neural foraminal narrowing. Narrowing of the left lateral recess at this level could affect the descending left S1 nerve roots. These results were called by telephone at the time of interpretation on 10/30/2022 at 11:41 pm to provider DAVID [facility], who verbally acknowledged these results. Electronically Signed   By: Merilyn Baba M.D.   On: 10/04/2022 23:41   CT Hip Left Wo Contrast  Result Date: 10/29/2022 CLINICAL DATA:  Hip trauma, fracture suspected. EXAM: CT OF THE LEFT HIP WITHOUT CONTRAST TECHNIQUE: Multidetector CT imaging of the left hip was performed according to the standard protocol. Multiplanar CT image reconstructions were  also generated. RADIATION DOSE REDUCTION: This exam was performed according to the departmental dose-optimization program which includes automated exposure control, adjustment of the mA and/or kV according to patient size and/or use of iterative reconstruction technique. COMPARISON:  None Available. FINDINGS: Bones/Joint/Cartilage Status post left hip cephalomedullary nail placement. The hardware is intact. No perihardware fracture. There is a small fracture about the posterior acetabular wall of indeterminate age. Ligaments Suboptimally assessed by CT. Muscles and Tendons No intramuscular hematoma or fluid collection. Soft tissues Skin and subcutaneous soft tissues are within normal limits. Prominent vascular calcifications. IMPRESSION: 1. Status post left hip cephalomedullary nail placement. The hardware is intact. No perihardware fracture. 2. Small fracture about the posterior acetabular wall of indeterminate age. 3. No intramuscular hematoma or fluid collection. Electronically Signed   By: Keane Police D.O.   On: 10/10/2022 21:05   CT Lumbar Spine Wo Contrast  Result Date: 10/21/2022 CLINICAL DATA:  Low back pain, trauma. EXAM: CT LUMBAR SPINE WITHOUT CONTRAST TECHNIQUE: Multidetector CT imaging of the lumbar  spine was performed without intravenous contrast administration. Multiplanar CT image reconstructions were also generated. RADIATION DOSE REDUCTION: This exam was performed according to the departmental dose-optimization program which includes automated exposure control, adjustment of the mA and/or kV according to patient size and/or use of iterative reconstruction technique. COMPARISON:  Radiographs dated October 13, 2022 FINDINGS: Segmentation: 5 lumbar type vertebrae. Alignment: Grade 1 anterolisthesis of L4, chronic process. Vertebrae: Compression fracture of the L4 vertebral body. There is a proximally 20% vertebral body height loss. No significant retropulsion of the vertebral body into the  spinal canal. Paraspinal and other soft tissues: Soft tissue swelling and hematoma about the vertebral body. Postsurgical changes for prior left nephrectomy. Advanced atherosclerotic disease of abdominal aorta and bilateral iliac vessels. Disc levels: Advanced multilevel degenerate disc disease with associated facet joint arthropathy. T12-L1: No significant spinal canal or neural foraminal stenosis. L1-L2: Disc osteophyte complex with mild bilateral lateral recess stenosis. Mild facet joint arthropathy. No significant neural foraminal stenosis. L2-L3: Disc osteophyte complex with bilateral lateral recess stenosis. Facet joint arthropathy. No significant neural foraminal stenosis. L3-L4: Mild retropulsion of the superior endplate of the L4 vertebral body into the spinal canal. Moderate bilateral lateral recess stenosis. Mild bilateral neural foraminal stenosis. L4-L5: Disc osteophyte complex with bilateral lateral recess stenosis. Mild to moderate bilateral neural foraminal stenosis, left worse than the right. Moderate bilateral facet joint arthropathy. L5-S1: Disc osteophyte complex with moderate lateral recess stenosis. Moderate-to-severe bilateral facet joint arthropathy. IMPRESSION: 1. Acute compression fracture of the L4 vertebral body with approximately 20% vertebral body height loss. Mild retropulsion of the superior endplate into the spinal canal. Further evaluation with MR examination is suggested. 2. Advanced multilevel degenerate disc disease and facet joint arthropathy as described above. 3. Aortic atherosclerosis. Aortic Atherosclerosis (ICD10-I70.0). Electronically Signed   By: Keane Police D.O.   On: 10/14/2022 20:56   CT PELVIS WO CONTRAST  Result Date: 10/22/2022 CLINICAL DATA:  Hip trauma, fracture suspected.  Low back pain. EXAM: CT PELVIS WITHOUT CONTRAST TECHNIQUE: Multidetector CT imaging of the pelvis was performed following the standard protocol without intravenous contrast. RADIATION  DOSE REDUCTION: This exam was performed according to the departmental dose-optimization program which includes automated exposure control, adjustment of the mA and/or kV according to patient size and/or use of iterative reconstruction technique. COMPARISON:  10/11/2022. FINDINGS: Urinary Tract:  No abnormality visualized. Bowel:  No acute abnormality. Vascular/Lymphatic: Atherosclerotic calcification of the distal abdominal aorta and its tributaries. No abdominal or pelvic lymphadenopathy. Reproductive:  Hysterectomy changes are noted. Other: A small amount of free fluid is present in the pelvis. Small fat containing umbilical hernia. Musculoskeletal: No acute fracture or dislocation at the left hip. Fixation hardware is present in the proximal left femur without evidence of hardware loosening. A compression fracture is noted in the superior endplate of L4, partially visualized on exam. Degenerative changes are noted in the lower lumbar spine. IMPRESSION: 1. No acute fracture or dislocation at the left hip. 2. Compression deformity in the superior endplate of L4, partially visualized on exam. Please refer to CT lumbar spine for additional information. 3. Aortic atherosclerosis. Electronically Signed   By: Brett Fairy M.D.   On: 10/08/2022 20:44   DG Hip Unilat W or Wo Pelvis 2-3 Views Left  Result Date: 10/06/2022 CLINICAL DATA:  Left hip pain. Weakness. Fall 1 month ago. EXAM: DG HIP (WITH OR WITHOUT PELVIS) 2-3V LEFT COMPARISON:  None Available. FINDINGS: Intramedullary nail with trans trochanteric and distal locking screw fixation of remote  proximal femur fracture. There is no periprosthetic fracture or lucency. Femoral head is well seated, no dislocation. Intact pubic rami. Pubic symphysis and sacroiliac joints are congruent. The bones are under mineralized. There are vascular calcifications IMPRESSION: 1. No acute fracture or dislocation of the left hip. 2. Hardware in place without complication.  Electronically Signed   By: Keith Rake M.D.   On: 10/11/2022 16:14   DG Lumbar Spine Complete  Result Date: 10/13/2022 CLINICAL DATA:  Fall in bathtub with lower back pain EXAM: [place] 4+ VIEW COMPARISON:  Radiographs 09/27/2009 FINDINGS: Demineralization. Grade 1 anterolisthesis of L4 is new since 2010 but is presumed chronic. Vertebral body height is maintained. Mild multilevel spondylosis. Mild disc space height loss at L3-L4 and L4-L5. Moderate lower lumbar facet arthropathy. Calcified splenic artery and aorta.  Surgical clips in the abdomen. IMPRESSION: Demineralization.  No definite acute fracture. Grade 1 anterolisthesis of L4 on L5. Electronically Signed   By: Placido Sou M.D.   On: 10/13/2022 00:57    Labs:  CBC: Recent Labs    11/06/22 0408 11/07/22 0419 11/08/22 0604 11/09/22 0639  WBC 4.9 3.4* 6.4 5.7  HGB 10.8* 11.9* 11.0* 11.0*  HCT 34.2* 38.8 37.1 37.4  PLT 97* 107* 120* 118*    COAGS: Recent Labs    10/25/22 0620 10/29/22 1351 11/02/22 0737  INR 1.3* 1.1 1.2    BMP: Recent Labs    11/06/22 0408 11/07/22 0419 11/08/22 0604 11/09/22 0639  NA 137 137 138 136  K 5.0 5.4* 6.5* 6.1*  CL 109 108 106 107  CO2 21* 23 24 21*  GLUCOSE 111* 232* 247* 196*  BUN 39* 42* 57* 70*  CALCIUM 6.6* 6.8* 6.7* 6.4*  CREATININE 1.92* 1.95* 2.06* 2.09*  GFRNONAA 25* 25* 23* 23*    LIVER FUNCTION TESTS: Recent Labs    11/04/22 0254 11/07/22 0419 11/08/22 0604 11/09/22 0639  BILITOT 0.7 0.6 0.5 0.6  AST 34 29 30 33  ALT _0 ALKPHOS 121 122 112 106  PROT 5.4* 5.8* 6.3* 6.1*  ALBUMIN 2.5* 2.6* 2.9* 2.9*    TUMOR MARKERS: No results for input(s): "AFPTM", "CEA", "CA199", "CHROMGRNA" in the last 8760 hours.  Assessment and Plan:  Metastatic hepatocellular carcinoma; durable venous access required for systemic therapy: Margaret Dyer, 87 year old female, is tentatively scheduled today for an image-guided port-a-catheter  placement. Her potassium this morning is 6.1 and her calcium is 6.4. She is currently receiving calcium gluconate.   Risks and benefits of image-guided Port-a-catheter placement were discussed with the patient including, but not limited to bleeding, infection, pneumothorax, or fibrin sheath development and need for additional procedures.  All of the patient's questions were answered, patient is agreeable to proceed. She has been NPO.   Consent signed and in chart.  Thank you for this interesting consult.  I greatly enjoyed meeting Margaret Dyer and look forward to participating in their care.  A copy of this report was sent to the requesting provider on this date.  Electronically Signed: Soyla Dryer, AGACNP-BC [phone] 11/09/2022, 8:10 AM   I spent a total of 20 Minutes    in face to face in clinical consultation, greater than 50% of which was counseling/coordinating care for port-a-catheter placement.

## 2022-11-09 NOTE — Inpatient Diabetes Management (Signed)
Inpatient Diabetes Program Recommendations  AACE/ADA: New Consensus Statement on Inpatient Glycemic Control (2015)  Target Ranges:  Prepandial:   less than 140 mg/dL      Peak postprandial:   less than 180 mg/dL (1-2 hours)      Critically ill patients:  140 - 180 mg/dL   Lab Results  Component Value Date   GLUCAP 180 (H) 11/09/2022   HGBA1C 6.4 (H) 10/25/2022    Review of Glycemic Control  Diabetes history: DM2 Outpatient Diabetes medications: Lantus 12 units QD, Apidra 1-2 units TID with meals Current orders for Inpatient glycemic control: Semglee 9 units QHS, Novolog 0-9 units TID with meals and 0-5 HS  Inpatient Diabetes Program Recommendations:    Consider adding Novolog 2 units TID if eating > 50% and not NPO.  Continue to follow.  Thank you. Lorenda Peck, RD, LDN, [facility] Inpatient Diabetes Coordinator [phone]

## 2022-11-10 ENCOUNTER — Other Ambulatory Visit: Payer: Self-pay

## 2022-11-10 ENCOUNTER — Inpatient Hospital Stay (HOSPITAL_COMMUNITY): Payer: Medicare Other

## 2022-11-10 ENCOUNTER — Ambulatory Visit
Admit: 2022-11-10 | Discharge: 2022-11-10 | Disposition: A | Discharge status: Home / Self Care | Payer: Medicare Other | Attending: Radiation Oncology | Admitting: Radiation Oncology

## 2022-11-10 DIAGNOSIS — C7951 Secondary malignant neoplasm of bone: Secondary | ICD-10-CM | POA: Diagnosis not present

## 2022-11-10 DIAGNOSIS — C220 Liver cell carcinoma: Secondary | ICD-10-CM | POA: Diagnosis not present

## 2022-11-10 DIAGNOSIS — Z510 Encounter for antineoplastic radiation therapy: Secondary | ICD-10-CM | POA: Diagnosis not present

## 2022-11-10 DIAGNOSIS — R52 Pain, unspecified: Secondary | ICD-10-CM | POA: Diagnosis not present

## 2022-11-10 HISTORY — PX: IR IMAGING GUIDED PORT INSERTION: IMG5740

## 2022-11-10 LAB — GLUCOSE, CAPILLARY
Glucose-Capillary: 209 mg/dL — ABNORMAL HIGH (ref 70–99)
Glucose-Capillary: 207 mg/dL — ABNORMAL HIGH (ref 70–99)
Glucose-Capillary: 163 mg/dL — ABNORMAL HIGH (ref 70–99)
Glucose-Capillary: 129 mg/dL — ABNORMAL HIGH (ref 70–99)
Glucose-Capillary: 99 mg/dL (ref 70–99)

## 2022-11-10 LAB — COMPREHENSIVE METABOLIC PANEL
ALT: 16 U/L (ref 0–44)
AST: 31 U/L (ref 15–41)
Albumin: 2.6 g/dL — ABNORMAL LOW (ref 3.5–5.0)
Alkaline Phosphatase: 88 U/L (ref 38–126)
Anion gap: 9 (ref 5–15)
BUN: 76 mg/dL — ABNORMAL HIGH (ref 8–23)
CO2: 21 mmol/L — ABNORMAL LOW (ref 22–32)
Calcium: 6.5 mg/dL — ABNORMAL LOW (ref 8.9–10.3)
Chloride: 104 mmol/L (ref 98–111)
Creatinine, Ser: 1.9 mg/dL — ABNORMAL HIGH (ref 0.44–1.00)
GFR, Estimated: 26 mL/min — ABNORMAL LOW (ref >60)
Glucose, Bld: 194 mg/dL — ABNORMAL HIGH (ref 70–99)
Potassium: 5.2 mmol/L — ABNORMAL HIGH (ref 3.5–5.1)
Sodium: 134 mmol/L — ABNORMAL LOW (ref 135–145)
Total Bilirubin: 0.6 mg/dL (ref 0.3–1.2)
Total Protein: 5.4 g/dL — ABNORMAL LOW (ref 6.5–8.1)

## 2022-11-10 LAB — IRON AND TIBC
Iron: 59 ug/dL (ref 28–170)
Saturation Ratios: 27 % (ref 10.4–31.8)
TIBC: 219 ug/dL — ABNORMAL LOW (ref 250–450)
UIBC: 160 ug/dL

## 2022-11-10 LAB — RAD ONC ARIA SESSION SUMMARY
Course Elapsed Days: 0
Plan Fractions Treated to Date: 1
Plan Prescribed Dose Per Fraction: 3 Gy
Plan Total Fractions Prescribed: 10
Plan Total Prescribed Dose: 30 Gy
Reference Point Dosage Given to Date: 3 Gy
Reference Point Session Dosage Given: 3 Gy
Session Number: 1

## 2022-11-10 LAB — CBC
HCT: 35.1 % — ABNORMAL LOW (ref 36.0–46.0)
Hemoglobin: 10.4 g/dL — ABNORMAL LOW (ref 12.0–15.0)
MCH: 28.7 pg (ref 26.0–34.0)
MCHC: 29.6 g/dL — ABNORMAL LOW (ref 30.0–36.0)
MCV: 96.7 fL (ref 80.0–100.0)
Platelets: 113 10*3/uL — ABNORMAL LOW (ref 150–400)
RBC: 3.63 MIL/uL — ABNORMAL LOW (ref 3.87–5.11)
RDW: 17.5 % — ABNORMAL HIGH (ref 11.5–15.5)
WBC: 4.7 10*3/uL (ref 4.0–10.5)
nRBC: 0 % (ref 0.0–0.2)

## 2022-11-10 LAB — PREALBUMIN: Prealbumin: 23 mg/dL (ref 18–38)

## 2022-11-10 LAB — TSH: TSH: 0.24 u[IU]/mL — ABNORMAL LOW (ref 0.350–4.500)

## 2022-11-10 LAB — AFP TUMOR MARKER

## 2022-11-10 LAB — MAGNESIUM: Magnesium: 3.3 mg/dL — ABNORMAL HIGH (ref 1.7–2.4)

## 2022-11-10 MED ORDER — HEPARIN SOD (PORK) LOCK FLUSH 100 UNIT/ML IV SOLN
INTRAVENOUS | Status: AC
  Filled 2022-11-10: qty 5

## 2022-11-10 MED ORDER — MIDAZOLAM HCL 2 MG/2ML IJ SOLN
INTRAMUSCULAR | Status: AC
  Filled 2022-11-10: qty 2

## 2022-11-10 MED ORDER — LIDOCAINE-EPINEPHRINE 1 %-1:100000 IJ SOLN
INTRAMUSCULAR | Status: AC
  Administered 2022-11-10: 10 mL
  Filled 2022-11-10: qty 1

## 2022-11-10 MED ORDER — ALBUTEROL SULFATE (2.5 MG/3ML) 0.083% IN NEBU
10.0000 mg | INHALATION_SOLUTION | Freq: Once | RESPIRATORY_TRACT | Status: AC
  Administered 2022-11-10: 10 mg via RESPIRATORY_TRACT
  Filled 2022-11-10: qty 12

## 2022-11-10 MED ORDER — DEXTROSE 50 % IV SOLN
1.0000 | Freq: Once | INTRAVENOUS | Status: AC
  Administered 2022-11-10: 50 mL via INTRAVENOUS
  Filled 2022-11-10: qty 50

## 2022-11-10 MED ORDER — FENTANYL CITRATE (PF) 100 MCG/2ML IJ SOLN
INTRAMUSCULAR | Status: AC
  Filled 2022-11-10: qty 2

## 2022-11-10 MED ORDER — INSULIN ASPART 100 UNIT/ML IV SOLN
5.0000 [IU] | Freq: Once | INTRAVENOUS | Status: AC
  Administered 2022-11-10: 5 [IU] via INTRAVENOUS

## 2022-11-10 MED ORDER — FENTANYL CITRATE (PF) 100 MCG/2ML IJ SOLN
INTRAMUSCULAR | Status: AC | PRN
  Administered 2022-11-10 (×2): 25 ug via INTRAVENOUS

## 2022-11-10 MED ORDER — MIDAZOLAM HCL 2 MG/2ML IJ SOLN
INTRAMUSCULAR | Status: AC | PRN
  Administered 2022-11-10: .5 mg via INTRAVENOUS

## 2022-11-10 NOTE — Procedures (Signed)
Interventional Radiology Procedure Note  Procedure: Port placement.  Indication: Metastatic HCC  Findings: Please refer to procedural dictation for full description.  Complications: None  EBL: < 10 mL  Miachel Roux, MD [phone]

## 2022-11-10 NOTE — Progress Notes (Signed)
[name] is looking a little bit better.  She still wearing oxygen.  Her oxygen saturation is 96%.  Her radiation hopefully will start today.  I still do not think that we can do a kyphoplasty on her as she just does not able to lie on her stomach yet.  Her blood counts show white cell count of 4.7.  Hemoglobin 10.4.  Platelet count 113,000.  I will check some iron studies on her.  There has been problems with her potassium.  I do not have back her metabolic panel as of yet.  1 issue is her nutrition.  She just does not like the food in the hospital.  Hopefully, her family will be able to bring some food in for her.  I think this will certainly help her out.  I think she has some physical therapy yesterday.  She needs quite a bit of physical therapy.  There is still the problems with pain in her back because of the fracture at L4.  I know she wears a back brace when she is out of bed.  However, I think the only way that the fracture will really improve his with kyphoplasty and radiation.  I am still not sure I think he will be able to really do outpatient radiation.  I do think that transportation is going to be a real problem for her with respect to her back and pain and possibility of worsening this fracture.  Her vital signs show temperature of 97.5.  Pulse 66.  Blood pressure 119/44.  Her lungs sound pretty clear bilaterally.  I do not hear any wheezing.  Cardiac exam regular rate and rhythm.  Abdomen is obese but soft.  She has decent bowel sounds.  There is no fluid wave.  There is no palpable liver or spleen tip.  Extremity shows no clubbing, cyanosis or edema.  Skin exam does show scattered ecchymoses.  [name] has metastatic hepatocellular carcinoma.  This is by virtue of her alpha-fetoprotein of 90,000.  Again she really needs to start radiation therapy to the back to try to get this fracture to heal.  Maybe, if her breathing improves, she will be able to have the kyphoplasty.  We  will not need a biopsy which should hopefully make the procedure go more quickly.  Hopefully, she will eat a little bit better.  We will have to check another chest x-ray on her to see how her lungs look.  I do appreciate everybody's help.  I know this is very complicated.  Her age certainly is working against her with respect to improving.  This is a real slow process.  I know she is trying her best.  I know everybody is trying to encourage her to improve.    [name], MD  Hebrew[place] 12:12

## 2022-11-10 NOTE — Progress Notes (Signed)
PROGRESS NOTE    Margaret Dyer  ZOX:[id number] DOB: Oct 04, 1935 DOA: 10/20/2022 PCP: Ann Held, DO   Brief Narrative:  87 year old with history of DM2, HTN, HLD, diastolic CHF, pulmonary HTN, cirrhosis, CKD stage IIIa, renal cell carcinoma with left nephrectomy in 2000, hypothyroidism presented to hospital with intractable back pain.  CT scan showed compression fracture of L4 with innumerable bilateral pulmonary metastases, posterior left acetabular wall fracture.  MRI confirmed these findings and patient was admitted to the hospital.  Radiology and oncology were consulted.  IR procedure for kyphoplasty or biopsy was postponed as patient was unable to lay flat.  Patient was transferred to [facility] for radiation treatments.  Since being here patient is getting pain control, Chemo-Port placed on 12/8.  Hospital course complicated by hyperkalemia   Assessment & Plan:  Principal Problem:   Intractable pain Active Problems:   Hyperlipidemia   Gout, unspecified   Essential hypertension   Asthma   Anxiety and depression   Platelets decreased (HCC)   Diabetes mellitus type 2, insulin dependent (HCC)   History of nephrectomy   Liver cirrhosis secondary to NASH (HCC)   Thrombocytopenia (HCC)   Hypothyroidism   Chronic hypoxemic respiratory failure (HCC)   Chronic diastolic CHF (congestive heart failure) (HCC)   Pulmonary hypertension (HCC)   Chronic renal disease, stage 4, severely decreased glomerular filtration rate (GFR) between 15-29 mL/min/1.73 square meter (HCC)   OSA (obstructive sleep apnea)   Restrictive lung disease   Pathological fracture   Hepatocellular carcinoma metastatic to lung (HCC)    Intractable back pain with compression fracture of L4 with concern for pathological fracture and metastasis MRI, CT of the pelvis showed compression fracture of L4, pathological with 30% vertebral height loss.  Kyphoplasty and any kind of biopsy currently on hold as patient is  unable to tolerate this due to pain and discomfort.  Radiation treatments to be started, total 10 per radiology.  Chemo-Port placed on 12/8. Pain management Aspirin on hold.    Innumerable bilateral pulmonary metastasis History of renal cell CA with left nephrectomy CT scan obtained on admission showed innumerable bilateral pulmonary metastases as well as possible liver mets and bone mets.  Oncology suspecting metastatic hepatocellular carcinoma due to elevated AFP.  Now on radiation treatments (anticipate total 10) and eventually outpatient immunotherapy.  Will defer these discussions to oncology team. 11/27, underwent liver biopsy -showed cirrhosis but no malignant disease On steroids started by oncology  Abnormal breath sounds - Ordered two-view chest x-ray this morning but it had to be changed to 1 view as patient is having hard time moving due to pain and discomfort therefore we will get a portable 1  Hyperkalemia - Still has mild hyperkalemia this morning, IV insulin, D50 and albuterol ordered  Hypocalcemia Replete as necessary    CKD stage IV history of renal cell carcinoma with left  nephrectomy Baseline creatinine close to 1.8, creatinine this morning 1.9.  Continue to monitor.  Demadex on hold   PNA Procalcitonin 0.35 x-ray was suggestive of infiltrate, repeat clear.  Patient did receive 5 days of IV meropenem   Posterior left acetabular wall fracture of indeterminate age CT of the left hip showed small fracture of posterior acetabular wall of indeterminate age 65/22, seen by [facility], recommended PWB with assistance of a walker, Follow in office as outpt 3 weeks for repeat xrays   Klebsiella UTI Treated earlier during her hospitalization    Type 2 diabetes mellitus with hypoglycemia  A1c 6.4 on 10/25/2022 SSI.  Semglee 9 units daily   COPD /Chronic hypoxic respiratory failure  on home O2 3.5liters Bronchodilators scheduled and as necessary   Essential  hypertension Currently on Coreg 12.5 mg twice daily, hydralazine, holding Demadex.  IV as needed   HLD On Zetia and Zocor   Hypothyroidism On Synthroid 25 mcg daily on weekdays. Continue the same.    Thrombocytopenia, chronic Does has ecchymosis ,but no overt bleeding Management per hematology oncology Dr. Marin Olp   Failure to thrive Secondary to cancer.  Palliative consult appreciated.   Obesity Estimated body mass index is 37.57 kg/m as calculated from the following:   Height as of this encounter: _0  (1.6 m).   Weight as of this encounter: 96.2 kg.    Code Status: Partial Code   Patient has been seen by palliative care team.  Appreciate their input. PT-SNF  DVT prophylaxis: Subcu heparin Code Status: Partial code-DNI Family Communication: Spoke with the daughter over the phone yesterday  Status is: Inpatient Maintain hospital stay for pain control and getting radiation treatments.   Nutritional status    Signs/Symptoms: estimated needs  Interventions: Ensure Enlive (each supplement provides 350kcal and 20 grams of protein), Refer to RD note for recommendations  Body mass index is 35.77 kg/m.         Subjective: Seen and examined at bedside.  Reporting of back pain as expected.  Tells me anytime she is being moved around she experiences quite a bit of pain and discomfort.  Examination: Constitutional: Not in acute distress.  Elderly frail Respiratory: Clear to auscultation bilaterally Cardiovascular: Normal sinus rhythm, no rubs Abdomen: Nontender nondistended good bowel sounds Musculoskeletal: No edema noted Skin: No rashes seen Neurologic: CN 2-12 grossly intact.  And nonfocal Psychiatric: Normal judgment and insight. Alert and oriented x 3. Normal mood.   Objective: Vitals:   11/10/22 1025 11/10/22 1030 11/10/22 1035 11/10/22 1040  BP: (!) 121/55 (!) 130/54 (!) 120/52 120/63  Pulse: 60 66 60 64  Resp: 11 (!) _1 Temp:      TempSrc:       SpO2: 93% 96% 96% 95%  Weight:      Height:        Intake/Output Summary (Last 24 hours) at 11/10/2022 1100 Last data filed at 11/10/2022 0700 Gross per 24 hour  Intake 1209.26 ml  Output 550 ml  Net 659.26 ml   Filed Weights   11/07/22 0500 11/08/22 0500 11/09/22 0500  Weight: 96.2 kg 91.6 kg 91.6 kg     Data Reviewed:   CBC: Recent Labs  Lab 11/04/22 0254 11/05/22 0207 11/06/22 0408 11/07/22 0419 11/08/22 0604 11/09/22 0639 11/10/22 0621  WBC 4.8 4.6 4.9 3.4* 6.4 5.7 4.7  NEUTROABS 2.8 2.9 3.0 2.9 5.8  --   --   HGB 10.7* 11.0* 10.8* 11.9* 11.0* 11.0* 10.4*  HCT 35.9* 35.0* 34.2* 38.8 37.1 37.4 35.1*  MCV 96.2 94.1 93.7 94.2 96.6 98.2 96.7  PLT 95* 99* 97* 107* 120* 118* 174*   Basic Metabolic Panel: Recent Labs  Lab 11/06/22 0408 11/07/22 0419 11/08/22 0604 11/09/22 0639 11/09/22 1011 11/10/22 0621  NA 137 137 138 136  --  134*  K 5.0 5.4* 6.5* 6.1* 5.7* 5.2*  CL 109 108 106 107  --  104  CO2 21* 23 24 21*  --  21*  GLUCOSE 111* 232* 247* 196*  --  194*  BUN 39* 42* 57* 70*  --  76*  CREATININE 1.92* 1.95* 2.06* 2.09*  --  1.90*  CALCIUM 6.6* 6.8* 6.7* 6.4*  --  6.5*  MG  --   --   --  3.2*  --  3.3*   GFR: Estimated Creatinine Clearance: 23.3 mL/min (A) (by C-G formula based on SCr of 1.9 mg/dL (H)). Liver Function Tests: Recent Labs  Lab 11/04/22 0254 11/07/22 0419 11/08/22 0604 11/09/22 0639 11/10/22 0621  AST 34 29 30 33 31  ALT _0 ALKPHOS 121 122 112 106 88  BILITOT 0.7 0.6 0.5 0.6 0.6  PROT 5.4* 5.8* 6.3* 6.1* 5.4*  ALBUMIN 2.5* 2.6* 2.9* 2.9* 2.6*   No results for input(s): "LIPASE", "AMYLASE" in the last 168 hours. No results for input(s): "AMMONIA" in the last 168 hours. Coagulation Profile: No results for input(s): "INR", "PROTIME" in the last 168 hours.  Cardiac Enzymes: No results for input(s): "CKTOTAL", "CKMB", "CKMBINDEX", "TROPONINI" in the last 168 hours. BNP (last 3 results) No results for input(s):  "PROBNP" in the last 8760 hours. HbA1C: No results for input(s): "HGBA1C" in the last 72 hours. CBG: Recent Labs  Lab 11/09/22 1204 11/09/22 1642 11/09/22 2139 11/10/22 0738 11/10/22 0915  GLUCAP 177* 231* 208* 209* 207*   Lipid Profile: No results for input(s): "CHOL", "HDL", "LDLCALC", "TRIG", "CHOLHDL", "LDLDIRECT" in the last 72 hours. Thyroid Function Tests: Recent Labs    11/10/22 0621  TSH 0.240*   Anemia Panel: No results for input(s): "VITAMINB12", "FOLATE", "FERRITIN", "TIBC", "IRON", "RETICCTPCT" in the last 72 hours. Sepsis Labs: Recent Labs  Lab 11/05/22 0207 11/06/22 0408  PROCALCITON 0.31 0.35    Recent Results (from the past 240 hour(s))  Respiratory (~20 pathogens) panel by PCR     Status: None   Collection Time: 11/06/22  9:14 AM   Specimen: Nasopharyngeal Swab; Respiratory  Result Value Ref Range Status   Adenovirus NOT DETECTED NOT DETECTED Final   Coronavirus 229E NOT DETECTED NOT DETECTED Final    Comment: (NOTE) The Coronavirus on the Respiratory Panel, DOES NOT test for the novel  Coronavirus (2019 nCoV)    Coronavirus HKU1 NOT DETECTED NOT DETECTED Final   Coronavirus NL63 NOT DETECTED NOT DETECTED Final   Coronavirus OC43 NOT DETECTED NOT DETECTED Final   Metapneumovirus NOT DETECTED NOT DETECTED Final   Rhinovirus / Enterovirus NOT DETECTED NOT DETECTED Final   Influenza A NOT DETECTED NOT DETECTED Final   Influenza B NOT DETECTED NOT DETECTED Final   Parainfluenza Virus 1 NOT DETECTED NOT DETECTED Final   Parainfluenza Virus 2 NOT DETECTED NOT DETECTED Final   Parainfluenza Virus 3 NOT DETECTED NOT DETECTED Final   Parainfluenza Virus 4 NOT DETECTED NOT DETECTED Final   Respiratory Syncytial Virus NOT DETECTED NOT DETECTED Final   Bordetella pertussis NOT DETECTED NOT DETECTED Final   Bordetella Parapertussis NOT DETECTED NOT DETECTED Final   Chlamydophila pneumoniae NOT DETECTED NOT DETECTED Final   Mycoplasma pneumoniae NOT  DETECTED NOT DETECTED Final    Comment: Performed at [facility], [address] [place], [place] [address]         Radiology Studies: No results found.      Scheduled Meds:  carvedilol  12.5 mg Oral BID WC   dexamethasone  4 mg Oral Q12H   docusate sodium  100 mg Oral BID   feeding supplement  237 mL Oral TID BM   fentaNYL  1 patch Transdermal Q72H   fentaNYL       heparin  5,000 Units Subcutaneous Q8H   heparin lock flush       hydrALAZINE  50 mg Oral Q8H   insulin aspart  0-5 Units Subcutaneous QHS   insulin aspart  0-9 Units Subcutaneous TID WC   insulin aspart  2 Units Subcutaneous TID WC   insulin glargine-yfgn  9 Units Subcutaneous QHS   levothyroxine  25 mcg Oral Q [id number]   lidocaine  1 patch Transdermal QHS   midazolam       multivitamin with minerals  1 tablet Oral Daily   oxybutynin  5 mg Oral Daily   senna-docusate  1 tablet Oral QHS   Continuous Infusions:  sodium chloride       LOS: 16 days   Time spent= 35 mins    Alianis Trimmer Arsenio Loader, MD [facility]  If 7PM-7AM, please contact night-coverage  11/10/2022, 11:00 AM

## 2022-11-11 ENCOUNTER — Inpatient Hospital Stay (HOSPITAL_COMMUNITY): Payer: Medicare Other

## 2022-11-11 DIAGNOSIS — R52 Pain, unspecified: Secondary | ICD-10-CM | POA: Diagnosis not present

## 2022-11-11 LAB — GLUCOSE, CAPILLARY
Glucose-Capillary: 155 mg/dL — ABNORMAL HIGH (ref 70–99)
Glucose-Capillary: 108 mg/dL — ABNORMAL HIGH (ref 70–99)
Glucose-Capillary: 186 mg/dL — ABNORMAL HIGH (ref 70–99)
Glucose-Capillary: 117 mg/dL — ABNORMAL HIGH (ref 70–99)

## 2022-11-11 LAB — CBC
HCT: 36.2 % (ref 36.0–46.0)
Hemoglobin: 10.9 g/dL — ABNORMAL LOW (ref 12.0–15.0)
MCH: 28.6 pg (ref 26.0–34.0)
MCHC: 30.1 g/dL (ref 30.0–36.0)
MCV: 95 fL (ref 80.0–100.0)
Platelets: 118 10*3/uL — ABNORMAL LOW (ref 150–400)
RBC: 3.81 MIL/uL — ABNORMAL LOW (ref 3.87–5.11)
RDW: 17.9 % — ABNORMAL HIGH (ref 11.5–15.5)
WBC: 4.5 10*3/uL (ref 4.0–10.5)
nRBC: 0 % (ref 0.0–0.2)

## 2022-11-11 LAB — MAGNESIUM: Magnesium: 3.4 mg/dL — ABNORMAL HIGH (ref 1.7–2.4)

## 2022-11-11 LAB — COMPREHENSIVE METABOLIC PANEL
ALT: 19 U/L (ref 0–44)
AST: 34 U/L (ref 15–41)
Albumin: 2.9 g/dL — ABNORMAL LOW (ref 3.5–5.0)
Alkaline Phosphatase: 88 U/L (ref 38–126)
Anion gap: 8 (ref 5–15)
BUN: 87 mg/dL — ABNORMAL HIGH (ref 8–23)
CO2: 23 mmol/L (ref 22–32)
Calcium: 6.9 mg/dL — ABNORMAL LOW (ref 8.9–10.3)
Chloride: 109 mmol/L (ref 98–111)
Creatinine, Ser: 2.02 mg/dL — ABNORMAL HIGH (ref 0.44–1.00)
GFR, Estimated: 24 mL/min — ABNORMAL LOW (ref >60)
Glucose, Bld: 152 mg/dL — ABNORMAL HIGH (ref 70–99)
Potassium: 5.6 mmol/L — ABNORMAL HIGH (ref 3.5–5.1)
Sodium: 140 mmol/L (ref 135–145)
Total Bilirubin: 0.8 mg/dL (ref 0.3–1.2)
Total Protein: 6 g/dL — ABNORMAL LOW (ref 6.5–8.1)

## 2022-11-11 LAB — BRAIN NATRIURETIC PEPTIDE: B Natriuretic Peptide: 879.2 pg/mL — ABNORMAL HIGH (ref 0.0–100.0)

## 2022-11-11 LAB — PROCALCITONIN: Procalcitonin: 0.52 ng/mL

## 2022-11-11 MED ORDER — SODIUM ZIRCONIUM CYCLOSILICATE 10 G PO PACK
10.0000 g | PACK | Freq: Once | ORAL | Status: AC
  Administered 2022-11-11: 10 g via ORAL
  Filled 2022-11-11: qty 1

## 2022-11-11 MED ORDER — SODIUM CHLORIDE 0.9 % IV SOLN
500.0000 mg | INTRAVENOUS | Status: DC
  Administered 2022-11-11 – 2022-11-12 (×2): 500 mg via INTRAVENOUS
  Filled 2022-11-11 (×3): qty 5

## 2022-11-11 MED ORDER — TRAMADOL HCL 50 MG PO TABS
50.0000 mg | ORAL_TABLET | Freq: Four times a day (QID) | ORAL | Status: DC | PRN
  Administered 2022-11-11 – 2022-11-22 (×14): 50 mg via ORAL
  Filled 2022-11-11 (×15): qty 1

## 2022-11-11 MED ORDER — SODIUM CHLORIDE 0.9 % IV SOLN: INTRAVENOUS | Status: DC

## 2022-11-11 NOTE — Progress Notes (Signed)
PROGRESS NOTE    Margaret Dyer  QJJ:[id number] DOB: 12/13/1934 DOA: 10/11/2022 PCP: Ann Held, DO   Brief Narrative:  87 year old with history of DM2, HTN, HLD, diastolic CHF, pulmonary HTN, cirrhosis, CKD stage IIIa, renal cell carcinoma with left nephrectomy in 2000, hypothyroidism presented to hospital with intractable back pain.  CT scan showed compression fracture of L4 with innumerable bilateral pulmonary metastases, posterior left acetabular wall fracture.  MRI confirmed these findings and patient was admitted to the hospital.  Radiology and oncology were consulted.  IR procedure for kyphoplasty or biopsy was postponed as patient was unable to lay flat.  Patient was transferred to [facility] for radiation treatments.  Since being here patient is getting pain control, Chemo-Port placed on 12/8.  Hospital course complicated by hyperkalemia.   Assessment & Plan:  Principal Problem:   Intractable pain Active Problems:   Hyperlipidemia   Gout, unspecified   Essential hypertension   Asthma   Anxiety and depression   Platelets decreased (HCC)   Diabetes mellitus type 2, insulin dependent (HCC)   History of nephrectomy   Liver cirrhosis secondary to NASH (HCC)   Thrombocytopenia (HCC)   Hypothyroidism   Chronic hypoxemic respiratory failure (HCC)   Chronic diastolic CHF (congestive heart failure) (HCC)   Pulmonary hypertension (HCC)   Chronic renal disease, stage 4, severely decreased glomerular filtration rate (GFR) between 15-29 mL/min/1.73 square meter (HCC)   OSA (obstructive sleep apnea)   Restrictive lung disease   Pathological fracture   Hepatocellular carcinoma metastatic to lung (HCC)    Intractable back pain with compression fracture of L4 with concern for pathological fracture and metastasis MRI, CT of the pelvis showed compression fracture of L4, pathological with 30% vertebral height loss.  Kyphoplasty and any kind of biopsy currently on hold as patient  is unable to tolerate this due to pain and discomfort.  Radiation treatments to be started, total 10 per radiology.  Status post Chemo-Port placement 12/8 and started her radiation 12/8  Innumerable bilateral pulmonary metastasis History of renal cell CA with left nephrectomy CT scan obtained on admission showed innumerable bilateral pulmonary metastases as well as possible liver mets and bone mets.  Oncology suspecting metastatic hepatocellular carcinoma due to elevated AFP.  Now on radiation treatments (anticipate total 10) and eventually outpatient immunotherapy.  Will defer these discussions to oncology team. 11/27, underwent liver biopsy -showed cirrhosis but no malignant disease On steroids started by oncology  Abnormal breath sounds - Chest x-ray concerning for Retrocardiac opacity, CT chest without contrast ordered by Onc and added daily azithromycin.  Will check procalcitonin and BNP  Hyperkalemia - Labs from this morning are pending  Hypocalcemia Replete as necessary    CKD stage IV history of renal cell carcinoma with left  nephrectomy Baseline creatinine close to 1.8, creatinine this morning 1.9.  Continue to monitor.  Demadex on hold   PNA Procalcitonin 0.35 x-ray was suggestive of infiltrate, repeat clear.  Patient did receive 5 days of IV meropenem   Posterior left acetabular wall fracture of indeterminate age CT of the left hip showed small fracture of posterior acetabular wall of indeterminate age 85/22, seen by [facility], recommended PWB with assistance of a walker, Follow in office as outpt 3 weeks for repeat xrays   Klebsiella UTI Treated earlier during her hospitalization    Type 2 diabetes mellitus with hypoglycemia A1c 6.4 on 10/25/2022 SSI.  Semglee 9 units daily   COPD /Chronic hypoxic respiratory failure  on home O2 3.5liters Bronchodilators scheduled and as necessary   Essential hypertension Currently on Coreg 12.5 mg twice daily,  hydralazine, holding Demadex.  IV as needed   HLD On Zetia and Zocor   Hypothyroidism On Synthroid 25 mcg daily on weekdays. Continue the same.    Thrombocytopenia, chronic Does has ecchymosis ,but no overt bleeding Management per hematology oncology Dr. Marin Olp   Failure to thrive Secondary to cancer.  Palliative consult appreciated.   Obesity Estimated body mass index is 37.57 kg/m as calculated from the following:   Height as of this encounter: _0  (1.6 m).   Weight as of this encounter: 96.2 kg.    Code Status: Partial Code   Patient has been seen by palliative care team.  Appreciate their input.  Unfortunately given multiple comorbidities, advanced malignancy, advanced age-she overall has poor prognosis and likely poor quality of life.  Patient and family for now wishes to proceed with treatment as planned PT-SNF  DVT prophylaxis: Subcu heparin Code Status: Partial code-DNI Family Communication:   Status is: Inpatient Maintain hospital stay for pain control and getting radiation treatments.   Nutritional status    Signs/Symptoms: estimated needs  Interventions: Ensure Enlive (each supplement provides 350kcal and 20 grams of protein), Refer to RD note for recommendations  Body mass index is 38.19 kg/m.         Subjective: Seen and examined at bedside, does not have any new complaints.  Slightly drowsy but answering appropriately. Tells me oxycodone did not make her feel right therefore would like to use a different pain medication.  Examination: Constitutional: Not in acute distress, chronically ill Respiratory: Bilateral rhonchi especially at bases Cardiovascular: Normal sinus rhythm, no rubs Abdomen: Nontender nondistended good bowel sounds Musculoskeletal: No edema noted Skin: No rashes seen Neurologic: CN 2-12 grossly intact.  And nonfocal Psychiatric: Normal judgment and insight. Alert and oriented x 3. Normal mood. Chemo-Port in place in  right chest wall External female catheter  Objective: Vitals:   11/10/22 2056 11/10/22 2130 11/10/22 2140 11/11/22 0531  BP:  (!) 117/34 (!) 108/55 (!) 138/54  Pulse:  (!) 56 63 61  Resp:  17  19  Temp:  97.8 F (36.6 C)  97.7 F (36.5 C)  TempSrc:  Oral  Oral  SpO2: 97% 98%  98%  Weight:    97.8 kg  Height:       No intake or output data in the 24 hours ending 11/11/22 0750  Filed Weights   11/08/22 0500 11/09/22 0500 11/11/22 0531  Weight: 91.6 kg 91.6 kg 97.8 kg     Data Reviewed:   CBC: Recent Labs  Lab 11/05/22 0207 11/06/22 0408 11/07/22 0419 11/08/22 0604 11/09/22 0639 11/10/22 0621  WBC 4.6 4.9 3.4* 6.4 5.7 4.7  NEUTROABS 2.9 3.0 2.9 5.8  --   --   HGB 11.0* 10.8* 11.9* 11.0* 11.0* 10.4*  HCT 35.0* 34.2* 38.8 37.1 37.4 35.1*  MCV 94.1 93.7 94.2 96.6 98.2 96.7  PLT 99* 97* 107* 120* 118* 937*   Basic Metabolic Panel: Recent Labs  Lab 11/06/22 0408 11/07/22 0419 11/08/22 0604 11/09/22 0639 11/09/22 1011 11/10/22 0621  NA 137 137 138 136  --  134*  K 5.0 5.4* 6.5* 6.1* 5.7* 5.2*  CL 109 108 106 107  --  104  CO2 21* 23 24 21*  --  21*  GLUCOSE 111* 232* 247* 196*  --  194*  BUN 39* 42* 57* 70*  --  76*  CREATININE 1.92* 1.95* 2.06* 2.09*  --  1.90*  CALCIUM 6.6* 6.8* 6.7* 6.4*  --  6.5*  MG  --   --   --  3.2*  --  3.3*   GFR: Estimated Creatinine Clearance: 24.1 mL/min (A) (by C-G formula based on SCr of 1.9 mg/dL (H)). Liver Function Tests: Recent Labs  Lab 11/07/22 0419 11/08/22 0604 11/09/22 0639 11/10/22 0621  AST 29 30 33 31  ALT _0 ALKPHOS 122 112 106 88  BILITOT 0.6 0.5 0.6 0.6  PROT 5.8* 6.3* 6.1* 5.4*  ALBUMIN 2.6* 2.9* 2.9* 2.6*   No results for input(s): "LIPASE", "AMYLASE" in the last 168 hours. No results for input(s): "AMMONIA" in the last 168 hours. Coagulation Profile: No results for input(s): "INR", "PROTIME" in the last 168 hours.  Cardiac Enzymes: No results for input(s): "CKTOTAL", "CKMB",  "CKMBINDEX", "TROPONINI" in the last 168 hours. BNP (last 3 results) No results for input(s): "PROBNP" in the last 8760 hours. HbA1C: No results for input(s): "HGBA1C" in the last 72 hours. CBG: Recent Labs  Lab 11/10/22 0738 11/10/22 0915 11/10/22 1118 11/10/22 1607 11/10/22 2126  GLUCAP 209* 207* 163* 129* 99   Lipid Profile: No results for input(s): "CHOL", "HDL", "LDLCALC", "TRIG", "CHOLHDL", "LDLDIRECT" in the last 72 hours. Thyroid Function Tests: Recent Labs    11/10/22 0621  TSH 0.240*   Anemia Panel: Recent Labs    11/10/22 0621  TIBC 219*  IRON 59   Sepsis Labs: Recent Labs  Lab 11/05/22 0207 11/06/22 0408  PROCALCITON 0.31 0.35    Recent Results (from the past 240 hour(s))  Respiratory (~20 pathogens) panel by PCR     Status: None   Collection Time: 11/06/22  9:14 AM   Specimen: Nasopharyngeal Swab; Respiratory  Result Value Ref Range Status   Adenovirus NOT DETECTED NOT DETECTED Final   Coronavirus 229E NOT DETECTED NOT DETECTED Final    Comment: (NOTE) The Coronavirus on the Respiratory Panel, DOES NOT test for the novel  Coronavirus (2019 nCoV)    Coronavirus HKU1 NOT DETECTED NOT DETECTED Final   Coronavirus NL63 NOT DETECTED NOT DETECTED Final   Coronavirus OC43 NOT DETECTED NOT DETECTED Final   Metapneumovirus NOT DETECTED NOT DETECTED Final   Rhinovirus / Enterovirus NOT DETECTED NOT DETECTED Final   Influenza A NOT DETECTED NOT DETECTED Final   Influenza B NOT DETECTED NOT DETECTED Final   Parainfluenza Virus 1 NOT DETECTED NOT DETECTED Final   Parainfluenza Virus 2 NOT DETECTED NOT DETECTED Final   Parainfluenza Virus 3 NOT DETECTED NOT DETECTED Final   Parainfluenza Virus 4 NOT DETECTED NOT DETECTED Final   Respiratory Syncytial Virus NOT DETECTED NOT DETECTED Final   Bordetella pertussis NOT DETECTED NOT DETECTED Final   Bordetella Parapertussis NOT DETECTED NOT DETECTED Final   Chlamydophila pneumoniae NOT DETECTED NOT DETECTED  Final   Mycoplasma pneumoniae NOT DETECTED NOT DETECTED Final    Comment: Performed at [facility], [place]. [address] [place], [place] [address]         Radiology Studies: IR IMAGING GUIDED PORT INSERTION  Result Date: 11/10/2022 INDICATION: Metastatic hepatocellular carcinoma EXAM: IMPLANTED PORT A CATH PLACEMENT WITH ULTRASOUND AND FLUOROSCOPIC GUIDANCE MEDICATIONS: None ANESTHESIA/SEDATION: Moderate (conscious) sedation was employed during this procedure. A total of Versed 0.5 mg and Fentanyl 50 mcg was administered intravenously by the radiology nurse. Total intra-service moderate Sedation Time: 27 minutes. The patient's level of consciousness and vital signs were monitored continuously by  radiology nursing throughout the procedure under my direct supervision. FLUOROSCOPY: Radiation Exposure Index (as provided by the fluoroscopic device): 3 mGy Kerma COMPLICATIONS: None immediate. PROCEDURE: The procedure, risks, benefits, and alternatives were explained to the patient. Questions regarding the procedure were encouraged and answered. The patient understands and consents to the procedure. A timeout was performed prior to the initiation of the procedure. Patient positioned supine on the angiography table. Right neck and anterior upper chest prepped and draped in the usual sterile fashion. All elements of maximal sterile barrier were utilized including, cap, mask, sterile gown, sterile gloves, large sterile drape, hand scrubbing and 2% Chlorhexidine for skin cleaning. The right internal jugular vein was evaluated with ultrasound and shown to be patent. A permanent ultrasound image was obtained and placed in the patient's medical record. Local anesthesia was provided with 1% lidocaine with epinephrine. Using sterile gel and a sterile probe cover, the right internal jugular vein was entered with a 21 ga needle during real time ultrasound guidance. 0.018 inch guidewire placed and 21 ga needle exchanged  for transitional dilator set. Utilizing fluoroscopy, 0.035 inch guidewire advanced centrally without difficulty. Attention then turned to the right anterior upper chest. Following local lidocaine administration, a port pocket was created. The catheter was connected to the port and brought from the pocket to the venotomy site through a subcutaneous tunnel. The catheter was cut to size and inserted through the peel-away sheath. The catheter tip was positioned at the cavoatrial junction using fluoroscopic guidance. The port aspirated and flushed well. The port pocket was closed with deep and superficial absorbable suture. The port pocket incision and venotomy sites were also sealed with Dermabond. IMPRESSION: Successful placement of a right internal jugular approach power injectable Port-A-Cath. The catheter is ready for immediate use. Electronically Signed   By: Miachel Roux M.D.   On: 11/10/2022 13:18   DG Chest Port 1 View  Result Date: 11/10/2022 CLINICAL DATA:  Shortness of breath. EXAM: PORTABLE CHEST 1 VIEW COMPARISON:  Chest radiograph 11/07/2022 FINDINGS: Port-A-Cath tip projects over the superior vena cava. Stable cardiomegaly. Aortic atherosclerosis. Increased retrocardiac consolidation. Additional patchy bilateral mid and upper lung airspace opacities. Possible small left pleural effusion. No pneumothorax. IMPRESSION: 1. Increased retrocardiac consolidation may represent pneumonia in the appropriate clinical setting. Additional bilateral mid and upper lung airspace opacities may represent associated multifocal infection. 2. Possible small left pleural effusion. 3. Cardiomegaly. Electronically Signed   By: Lovey Newcomer M.D.   On: 11/10/2022 11:29        Scheduled Meds:  carvedilol  12.5 mg Oral BID WC   dexamethasone  4 mg Oral Q12H   docusate sodium  100 mg Oral BID   feeding supplement  237 mL Oral TID BM   fentaNYL  1 patch Transdermal Q72H   heparin  5,000 Units Subcutaneous Q8H    hydrALAZINE  50 mg Oral Q8H   insulin aspart  0-5 Units Subcutaneous QHS   insulin aspart  0-9 Units Subcutaneous TID WC   insulin aspart  2 Units Subcutaneous TID WC   insulin glargine-yfgn  9 Units Subcutaneous QHS   levothyroxine  25 mcg Oral Q MTWThF   lidocaine  1 patch Transdermal QHS   multivitamin with minerals  1 tablet Oral Daily   oxybutynin  5 mg Oral Daily   senna-docusate  1 tablet Oral QHS   Continuous Infusions:  sodium chloride     azithromycin       LOS: 17 days   Time  spent= 35 mins    Margaret Heuer Arsenio Loader, MD [facility]  If 7PM-7AM, please contact night-coverage  11/11/2022, 7:50 AM

## 2022-11-11 NOTE — Progress Notes (Signed)
Margaret Dyer is clearly not as perky this morning.  She did have a chest x-ray yesterday.  This did not show some more issues with infiltrates.  Again I am unsure if this is pneumonia or possible localized fluid.  I ordered a CT of the chest.  She has not yet had this.  She did have the Port-A-Cath placed yesterday.  I think she may have started radiation yesterday.  There are no labs back yet today.  I think she may have eaten a little bit better yesterday.  Apparently, her daughter brought in some [place].  She does not complain of any pain.  She does not like the oxycodone.  Will switch this out and put her on some tramadol.  I think she is going need some antibiotics empirically for this infiltrate issue.  I will put her on some azithromycin.  I do not think there is any bleeding.  She does not have any diarrhea.  Yesterday, her potassium was coming down which was nice to see.  Renal function was also improving a little bit.  However, the BUN was going up.  This may be from the Decadron that she is on now.  Her vital signs are all stable.  Blood pressure is 138/54.  Temperature is 97.7.  Pulse 61.  Her oral exam is unremarkable.  There is no thrush.  Her lungs sound all right.  I do not hear any wheezing.  She has decent air movement bilaterally.  Cardiac exam regular rate and rhythm.  Abdomen is soft.  She is somewhat obese.  There is no fluid wave.  Extremity shows no clubbing, cyanosis or edema.  Neurological exam shows no focal neurological deficits.  Again, [name] certainly is not as alert this morning.  I just hate the fact that she keeps having issues.  I think when I have to work on her lungs.  Again I ordered a CT of the chest yesterday.  Hopefully this will be done today.  We will have to see what her labs look like.  Her nutrition is still incredibly important.  We will hopefully be able to get her out of bed.  Maybe she will have little bit of physical therapy  today.  I do appreciate the outstanding care she is getting from everybody up on 6 E.  [name], MD  Lurena Joiner 1:37

## 2022-11-12 ENCOUNTER — Other Ambulatory Visit: Payer: Self-pay

## 2022-11-12 DIAGNOSIS — R52 Pain, unspecified: Secondary | ICD-10-CM | POA: Diagnosis not present

## 2022-11-12 LAB — GLUCOSE, CAPILLARY
Glucose-Capillary: 156 mg/dL — ABNORMAL HIGH (ref 70–99)
Glucose-Capillary: 194 mg/dL — ABNORMAL HIGH (ref 70–99)
Glucose-Capillary: 286 mg/dL — ABNORMAL HIGH (ref 70–99)
Glucose-Capillary: 189 mg/dL — ABNORMAL HIGH (ref 70–99)

## 2022-11-12 LAB — CBC
HCT: 34.6 % — ABNORMAL LOW (ref 36.0–46.0)
Hemoglobin: 10.5 g/dL — ABNORMAL LOW (ref 12.0–15.0)
MCH: 29.2 pg (ref 26.0–34.0)
MCHC: 30.3 g/dL (ref 30.0–36.0)
MCV: 96.1 fL (ref 80.0–100.0)
Platelets: 118 10*3/uL — ABNORMAL LOW (ref 150–400)
RBC: 3.6 MIL/uL — ABNORMAL LOW (ref 3.87–5.11)
RDW: 17.9 % — ABNORMAL HIGH (ref 11.5–15.5)
WBC: 4.1 10*3/uL (ref 4.0–10.5)
nRBC: 0.5 % — ABNORMAL HIGH (ref 0.0–0.2)

## 2022-11-12 LAB — BASIC METABOLIC PANEL
Anion gap: 6 (ref 5–15)
BUN: 90 mg/dL — ABNORMAL HIGH (ref 8–23)
CO2: 24 mmol/L (ref 22–32)
Calcium: 6.6 mg/dL — ABNORMAL LOW (ref 8.9–10.3)
Chloride: 110 mmol/L (ref 98–111)
Creatinine, Ser: 1.8 mg/dL — ABNORMAL HIGH (ref 0.44–1.00)
GFR, Estimated: 27 mL/min — ABNORMAL LOW (ref >60)
Glucose, Bld: 164 mg/dL — ABNORMAL HIGH (ref 70–99)
Potassium: 5.8 mmol/L — ABNORMAL HIGH (ref 3.5–5.1)
Sodium: 140 mmol/L (ref 135–145)

## 2022-11-12 LAB — MAGNESIUM: Magnesium: 3.5 mg/dL — ABNORMAL HIGH (ref 1.7–2.4)

## 2022-11-12 MED ORDER — FUROSEMIDE 10 MG/ML IJ SOLN
40.0000 mg | Freq: Once | INTRAMUSCULAR | Status: AC
  Administered 2022-11-12: 40 mg via INTRAVENOUS
  Filled 2022-11-12: qty 4

## 2022-11-12 MED ORDER — SODIUM ZIRCONIUM CYCLOSILICATE 10 G PO PACK
10.0000 g | PACK | Freq: Two times a day (BID) | ORAL | Status: AC
  Administered 2022-11-12 – 2022-11-14 (×6): 10 g via ORAL
  Filled 2022-11-12 (×6): qty 1

## 2022-11-12 MED ORDER — ALBUTEROL SULFATE (2.5 MG/3ML) 0.083% IN NEBU
10.0000 mg | INHALATION_SOLUTION | Freq: Once | RESPIRATORY_TRACT | Status: AC
  Administered 2022-11-12: 10 mg via RESPIRATORY_TRACT
  Filled 2022-11-12: qty 12

## 2022-11-12 MED ORDER — CHLORHEXIDINE GLUCONATE CLOTH 2 % EX PADS
6.0000 | MEDICATED_PAD | Freq: Every day | CUTANEOUS | Status: DC
  Administered 2022-11-12 – 2022-11-22 (×11): 6 via TOPICAL

## 2022-11-12 NOTE — Progress Notes (Signed)
  Daily Progress Note   Patient Name: Margaret Dyer       Date: 11/12/2022 DOB: [birth year]  Age: 87 y.o. MRN#: [id number] Attending Physician: Damita Lack, MD Primary Care Physician: Carollee Herter, Alferd Apa, DO Admit Date: 10/13/2022 Length of Stay: 18 days  Discussed care with primary hospitalist today.  Patient continuing to have discussions regarding medical care moving forward with primary hospitalist and oncologist, Dr. Marin Olp.  Palliative medicine team following along peripherally.  Please reach out should further urgent issues arise.  Thank you.  Chelsea Aus, DO Palliative Care Provider PMT # [phone]

## 2022-11-12 NOTE — Progress Notes (Addendum)
PROGRESS NOTE    Margaret Dyer  YQI:[id number] DOB: 19-Jul-1935 DOA: 10/19/2022 PCP: Ann Held, DO   Brief Narrative:  86 year old with history of DM2, HTN, HLD, diastolic CHF, pulmonary HTN, cirrhosis, CKD stage IIIa, renal cell carcinoma with left nephrectomy in 2000, hypothyroidism presented to hospital with intractable back pain.  CT scan showed compression fracture of L4 with innumerable bilateral pulmonary metastases, posterior left acetabular wall fracture.  MRI confirmed these findings and patient was admitted to the hospital.  Radiology and oncology were consulted.  IR procedure for kyphoplasty or biopsy was postponed as patient was unable to lay flat.  Patient was transferred to [facility] for radiation treatments.  Since being here patient is getting pain control, Chemo-Port placed on 12/8.  Hospital course complicated by hyperkalemia.  Due to some respiratory distress, CT chest was done which showed signs of volume overload, some reduction in pulmonary nodules.   Assessment & Plan:  Principal Problem:   Intractable pain Active Problems:   Hyperlipidemia   Gout, unspecified   Essential hypertension   Asthma   Anxiety and depression   Platelets decreased (HCC)   Diabetes mellitus type 2, insulin dependent (HCC)   History of nephrectomy   Liver cirrhosis secondary to NASH (HCC)   Thrombocytopenia (HCC)   Hypothyroidism   Chronic hypoxemic respiratory failure (HCC)   Chronic diastolic CHF (congestive heart failure) (HCC)   Pulmonary hypertension (HCC)   Chronic renal disease, stage 4, severely decreased glomerular filtration rate (GFR) between 15-29 mL/min/1.73 square meter (HCC)   OSA (obstructive sleep apnea)   Restrictive lung disease   Pathological fracture   Hepatocellular carcinoma metastatic to lung (HCC)    Intractable back pain with compression fracture of L4 with concern for pathological fracture and metastasis MRI, CT of the pelvis showed  compression fracture of L4, pathological with 30% vertebral height loss.  Kyphoplasty and any kind of biopsy currently on hold as patient is unable to tolerate this due to pain and discomfort.  Radiation treatments to be started, total 10 per radiology.  Status post Chemo-Port placement 12/8 and started her radiation 12/8  Innumerable bilateral pulmonary metastasis History of renal cell CA with left nephrectomy CT scan obtained on admission showed innumerable bilateral pulmonary metastases as well as possible liver mets and bone mets.  Oncology suspecting metastatic hepatocellular carcinoma due to elevated AFP.  Now on radiation treatments (anticipate total 10) and eventually outpatient immunotherapy.  Will defer these discussions to oncology team. 11/27, underwent liver biopsy -showed cirrhosis but no malignant disease On steroids started by oncology  Abnormal breath sounds with signs of volume overload Anasarca - CT chest shows bilateral pleural effusion, ascites, and some reduction in pulmonary nodules -Elevated BNP.  Procalcitonin 0.5 - Lasix 40 mg IV once  Hyperkalemia Elevated BUN - Lokelma, Lasix and albuterol ordered -BUN can be elevated from mild renal dysfunction and steroids  Hypocalcemia Replete as necessary    CKD stage IV history of renal cell carcinoma with left  nephrectomy Baseline creatinine close to 1.8, creatinine 1.8.  Will resume Demadex p.o. hopefully starting tomorrow, today getting IV Lasix   PNA Treated with 5 days of IV meropenem   Posterior left acetabular wall fracture of indeterminate age CT of the left hip showed small fracture of posterior acetabular wall of indeterminate age 72/22, seen by [facility], recommended PWB with assistance of a walker, Follow in office as outpt 3 weeks for repeat xrays   Klebsiella UTI Treated earlier  during her hospitalization    Type 2 diabetes mellitus with hypoglycemia A1c 6.4 on 10/25/2022 SSI.  Semglee 9  units daily   COPD /Chronic hypoxic respiratory failure  on home O2 3.5liters Bronchodilators scheduled and as necessary   Essential hypertension Currently on Coreg 12.5 mg twice daily, hydralazine, holding Demadex.  IV as needed   HLD On Zetia and Zocor   Hypothyroidism On Synthroid 25 mcg daily on weekdays. Continue the same.    Thrombocytopenia, chronic Does has ecchymosis ,but no overt bleeding Management per hematology oncology Dr. Marin Olp   Failure to thrive Secondary to cancer.  Palliative consult appreciated.   Obesity Estimated body mass index is 37.57 kg/m as calculated from the following:   Height as of this encounter: _0  (1.6 m).   Weight as of this encounter: 96.2 kg.    Code Status: Partial Code   Patient has been seen by palliative care team.  Appreciate their input.  Unfortunately given multiple comorbidities, advanced malignancy, advanced age-she overall has poor prognosis and likely poor quality of life.  For now patient is getting radiation to help control her pain and eventual plans for immunotherapy PT-SNF  DVT prophylaxis: Subcu heparin Code Status: Partial code-DNI Family Communication: Daughter updated.   Status is: Inpatient Maintain hospital stay for pain control and getting radiation treatments.   Nutritional status    Signs/Symptoms: estimated needs  Interventions: Ensure Enlive (each supplement provides 350kcal and 20 grams of protein), Refer to RD note for recommendations  Body mass index is 38.55 kg/m.         Subjective: Some sob with movement and coughing. Otherwise no other complaints.   Examination: Constitutional: Not in acute distress; 2l [place] Respiratory: b/l rhonchi- diffuse Cardiovascular: Normal sinus rhythm, no rubs Abdomen: Nontender nondistended good bowel sounds Musculoskeletal: No edema noted Skin: No rashes seen Neurologic: CN 2-12 grossly intact.  And nonfocal Psychiatric: Normal judgment and  insight. Alert and oriented x 3. Normal mood. Chemo-Port in place in right chest wall External female catheter  Objective: Vitals:   11/11/22 0531 11/11/22 1746 11/11/22 2029 11/12/22 0426  BP: (!) 138/54  (!) 123/46 (!) 127/55  Pulse: 61  (!) 57 (!) 59  Resp: _1 Temp: 97.7 F (36.5 C)  97.7 F (36.5 C) 98.2 F (36.8 C)  TempSrc: Oral  Oral Oral  SpO2: 98% 97% 99% 98%  Weight: 97.8 kg   98.7 kg  Height:        Intake/Output Summary (Last 24 hours) at 11/12/2022 0740 Last data filed at 11/12/2022 0439 Gross per 24 hour  Intake 1732.96 ml  Output 600 ml  Net 1132.96 ml    Filed Weights   11/09/22 0500 11/11/22 0531 11/12/22 0426  Weight: 91.6 kg 97.8 kg 98.7 kg     Data Reviewed:   CBC: Recent Labs  Lab 11/06/22 0408 11/07/22 0419 11/08/22 0604 11/09/22 0639 11/10/22 0621 11/11/22 0729 11/12/22 0500  WBC 4.9 3.4* 6.4 5.7 4.7 4.5 4.1  NEUTROABS 3.0 2.9 5.8  --   --   --   --   HGB 10.8* 11.9* 11.0* 11.0* 10.4* 10.9* 10.5*  HCT 34.2* 38.8 37.1 37.4 35.1* 36.2 34.6*  MCV 93.7 94.2 96.6 98.2 96.7 95.0 96.1  PLT 97* 107* 120* 118* 113* 118* 332*   Basic Metabolic Panel: Recent Labs  Lab 11/08/22 0604 11/09/22 0639 11/09/22 1011 11/10/22 0621 11/11/22 0729 11/12/22 0500  NA 138 136  --  134* 140 140  K 6.5* 6.1* 5.7* 5.2* 5.6* 5.8*  CL 106 107  --  104 109 110  CO2 24 21*  --  21* 23 24  GLUCOSE 247* 196*  --  194* 152* 164*  BUN 57* 70*  --  76* 87* 90*  CREATININE 2.06* 2.09*  --  1.90* 2.02* 1.80*  CALCIUM 6.7* 6.4*  --  6.5* 6.9* 6.6*  MG  --  3.2*  --  3.3* 3.4* 3.5*   GFR: Estimated Creatinine Clearance: 25.6 mL/min (A) (by C-G formula based on SCr of 1.8 mg/dL (H)). Liver Function Tests: Recent Labs  Lab 11/07/22 0419 11/08/22 0604 11/09/22 0639 11/10/22 0621 11/11/22 0729  AST 29 30 33 31 34  ALT _0 ALKPHOS 122 112 106 88 88  BILITOT 0.6 0.5 0.6 0.6 0.8  PROT 5.8* 6.3* 6.1* 5.4* 6.0*  ALBUMIN 2.6* 2.9* 2.9*  2.6* 2.9*   No results for input(s): "LIPASE", "AMYLASE" in the last 168 hours. No results for input(s): "AMMONIA" in the last 168 hours. Coagulation Profile: No results for input(s): "INR", "PROTIME" in the last 168 hours.  Cardiac Enzymes: No results for input(s): "CKTOTAL", "CKMB", "CKMBINDEX", "TROPONINI" in the last 168 hours. BNP (last 3 results) No results for input(s): "PROBNP" in the last 8760 hours. HbA1C: No results for input(s): "HGBA1C" in the last 72 hours. CBG: Recent Labs  Lab 11/11/22 0752 11/11/22 1103 11/11/22 1615 11/11/22 2031 11/12/22 0725  GLUCAP 155* 108* 186* 117* 156*   Lipid Profile: No results for input(s): "CHOL", "HDL", "LDLCALC", "TRIG", "CHOLHDL", "LDLDIRECT" in the last 72 hours. Thyroid Function Tests: Recent Labs    11/10/22 0621  TSH 0.240*   Anemia Panel: Recent Labs    11/10/22 0621  TIBC 219*  IRON 59   Sepsis Labs: Recent Labs  Lab 11/06/22 0408 11/11/22 0752  PROCALCITON 0.35 0.52    Recent Results (from the past 240 hour(s))  Respiratory (~20 pathogens) panel by PCR     Status: None   Collection Time: 11/06/22  9:14 AM   Specimen: Nasopharyngeal Swab; Respiratory  Result Value Ref Range Status   Adenovirus NOT DETECTED NOT DETECTED Final   Coronavirus 229E NOT DETECTED NOT DETECTED Final    Comment: (NOTE) The Coronavirus on the Respiratory Panel, DOES NOT test for the novel  Coronavirus (2019 nCoV)    Coronavirus HKU1 NOT DETECTED NOT DETECTED Final   Coronavirus NL63 NOT DETECTED NOT DETECTED Final   Coronavirus OC43 NOT DETECTED NOT DETECTED Final   Metapneumovirus NOT DETECTED NOT DETECTED Final   Rhinovirus / Enterovirus NOT DETECTED NOT DETECTED Final   Influenza A NOT DETECTED NOT DETECTED Final   Influenza B NOT DETECTED NOT DETECTED Final   Parainfluenza Virus 1 NOT DETECTED NOT DETECTED Final   Parainfluenza Virus 2 NOT DETECTED NOT DETECTED Final   Parainfluenza Virus 3 NOT DETECTED NOT DETECTED  Final   Parainfluenza Virus 4 NOT DETECTED NOT DETECTED Final   Respiratory Syncytial Virus NOT DETECTED NOT DETECTED Final   Bordetella pertussis NOT DETECTED NOT DETECTED Final   Bordetella Parapertussis NOT DETECTED NOT DETECTED Final   Chlamydophila pneumoniae NOT DETECTED NOT DETECTED Final   Mycoplasma pneumoniae NOT DETECTED NOT DETECTED Final    Comment: Performed at [facility], [place]. [address], [place], [place] [address]         Radiology Studies: CT CHEST WO CONTRAST  Result Date: 11/11/2022 CLINICAL DATA:  Multiple lung nodules, pneumonia versus edema versus progressive  cancer, metastatic renal cell carcinoma * Tracking Code: BO * EXAM: CT CHEST WITHOUT CONTRAST TECHNIQUE: Multidetector CT imaging of the chest was performed following the standard protocol without IV contrast. RADIATION DOSE REDUCTION: This exam was performed according to the departmental dose-optimization program which includes automated exposure control, adjustment of the mA and/or kV according to patient size and/or use of iterative reconstruction technique. COMPARISON:  10/25/2022 FINDINGS: Cardiovascular: Right chest port catheter. Aortic atherosclerosis. Cardiomegaly. Three-vessel coronary artery calcifications. No pericardial effusion. Mediastinum/Nodes: No enlarged mediastinal, hilar, or axillary lymph nodes. Thyroid gland, trachea, and esophagus demonstrate no significant findings. Lungs/Pleura: Moderate bilateral pleural effusions and associated atelectasis or consolidation, worsened compared to prior examination. Numerous bilateral pulmonary nodules are diminished in size compared to prior examination, nodule of the anterior right upper lobe measuring 1.7 x 1.4 cm, previously 2.9 x 2.1 cm (series 7, image 43). Additional index nodule of the posterior left upper lobe measures 2.4 x 2.0 cm, previously 4.1 x 3.3 cm (series 7, image 28). Upper Abdomen: New, small volume ascites in the included upper abdomen.  Cirrhosis. Partially imaged left nephrectomy site. Musculoskeletal: No chest wall abnormality. No acute osseous findings. IMPRESSION: 1. Moderate bilateral pleural effusions and associated atelectasis or consolidation, worsened compared to prior examination. 2. Numerous bilateral pulmonary nodules are diminished in size compared to prior examination, consistent with treatment response. 3. New, small volume ascites in the included upper abdomen. 4. Cirrhosis. 5. Cardiomegaly and coronary artery disease. Aortic Atherosclerosis (ICD10-I70.0). Electronically Signed   By: Delanna Ahmadi M.D.   On: 11/11/2022 12:36   IR IMAGING GUIDED PORT INSERTION  Result Date: 11/10/2022 INDICATION: Metastatic hepatocellular carcinoma EXAM: IMPLANTED PORT A CATH PLACEMENT WITH ULTRASOUND AND FLUOROSCOPIC GUIDANCE MEDICATIONS: None ANESTHESIA/SEDATION: Moderate (conscious) sedation was employed during this procedure. A total of Versed 0.5 mg and Fentanyl 50 mcg was administered intravenously by the radiology nurse. Total intra-service moderate Sedation Time: 27 minutes. The patient's level of consciousness and vital signs were monitored continuously by radiology nursing throughout the procedure under my direct supervision. FLUOROSCOPY: Radiation Exposure Index (as provided by the fluoroscopic device): 3 mGy Kerma COMPLICATIONS: None immediate. PROCEDURE: The procedure, risks, benefits, and alternatives were explained to the patient. Questions regarding the procedure were encouraged and answered. The patient understands and consents to the procedure. A timeout was performed prior to the initiation of the procedure. Patient positioned supine on the angiography table. Right neck and anterior upper chest prepped and draped in the usual sterile fashion. All elements of maximal sterile barrier were utilized including, cap, mask, sterile gown, sterile gloves, large sterile drape, hand scrubbing and 2% Chlorhexidine for skin cleaning. The  right internal jugular vein was evaluated with ultrasound and shown to be patent. A permanent ultrasound image was obtained and placed in the patient's medical record. Local anesthesia was provided with 1% lidocaine with epinephrine. Using sterile gel and a sterile probe cover, the right internal jugular vein was entered with a 21 ga needle during real time ultrasound guidance. 0.018 inch guidewire placed and 21 ga needle exchanged for transitional dilator set. Utilizing fluoroscopy, 0.035 inch guidewire advanced centrally without difficulty. Attention then turned to the right anterior upper chest. Following local lidocaine administration, a port pocket was created. The catheter was connected to the port and brought from the pocket to the venotomy site through a subcutaneous tunnel. The catheter was cut to size and inserted through the peel-away sheath. The catheter tip was positioned at the cavoatrial junction using fluoroscopic guidance. The port  aspirated and flushed well. The port pocket was closed with deep and superficial absorbable suture. The port pocket incision and venotomy sites were also sealed with Dermabond. IMPRESSION: Successful placement of a right internal jugular approach power injectable Port-A-Cath. The catheter is ready for immediate use. Electronically Signed   By: Miachel Roux M.D.   On: 11/10/2022 13:18   DG Chest Port 1 View  Result Date: 11/10/2022 CLINICAL DATA:  Shortness of breath. EXAM: PORTABLE CHEST 1 VIEW COMPARISON:  Chest radiograph 11/07/2022 FINDINGS: Port-A-Cath tip projects over the superior vena cava. Stable cardiomegaly. Aortic atherosclerosis. Increased retrocardiac consolidation. Additional patchy bilateral mid and upper lung airspace opacities. Possible small left pleural effusion. No pneumothorax. IMPRESSION: 1. Increased retrocardiac consolidation may represent pneumonia in the appropriate clinical setting. Additional bilateral mid and upper lung airspace opacities  may represent associated multifocal infection. 2. Possible small left pleural effusion. 3. Cardiomegaly. Electronically Signed   By: Lovey Newcomer M.D.   On: 11/10/2022 11:29        Scheduled Meds:  carvedilol  12.5 mg Oral BID WC   Chlorhexidine Gluconate Cloth  6 each Topical Daily   dexamethasone  4 mg Oral Q12H   docusate sodium  100 mg Oral BID   feeding supplement  237 mL Oral TID BM   fentaNYL  1 patch Transdermal Q72H   heparin  5,000 Units Subcutaneous Q8H   hydrALAZINE  50 mg Oral Q8H   insulin aspart  0-5 Units Subcutaneous QHS   insulin aspart  0-9 Units Subcutaneous TID WC   insulin aspart  2 Units Subcutaneous TID WC   insulin glargine-yfgn  9 Units Subcutaneous QHS   levothyroxine  25 mcg Oral Q MTWThF   lidocaine  1 patch Transdermal QHS   multivitamin with minerals  1 tablet Oral Daily   oxybutynin  5 mg Oral Daily   senna-docusate  1 tablet Oral QHS   Continuous Infusions:  sodium chloride     sodium chloride 75 mL/hr at 11/12/22 0259   azithromycin Stopped (11/11/22 1100)     LOS: 18 days   Time spent= 35 mins    Everson Mott Arsenio Loader, MD [facility]  If 7PM-7AM, please contact night-coverage  11/12/2022, 7:40 AM

## 2022-11-13 ENCOUNTER — Other Ambulatory Visit: Payer: Self-pay

## 2022-11-13 ENCOUNTER — Ambulatory Visit
Admit: 2022-11-13 | Discharge: 2022-11-13 | Disposition: A | Discharge status: Home / Self Care | Payer: Medicare Other | Attending: Radiation Oncology | Admitting: Radiation Oncology

## 2022-11-13 ENCOUNTER — Inpatient Hospital Stay: Payer: Medicare Other | Attending: Radiation Oncology

## 2022-11-13 DIAGNOSIS — R52 Pain, unspecified: Secondary | ICD-10-CM | POA: Diagnosis not present

## 2022-11-13 DIAGNOSIS — C220 Liver cell carcinoma: Secondary | ICD-10-CM | POA: Diagnosis not present

## 2022-11-13 DIAGNOSIS — C7951 Secondary malignant neoplasm of bone: Secondary | ICD-10-CM | POA: Diagnosis not present

## 2022-11-13 DIAGNOSIS — Z510 Encounter for antineoplastic radiation therapy: Secondary | ICD-10-CM | POA: Diagnosis not present

## 2022-11-13 LAB — URINALYSIS, ROUTINE W REFLEX MICROSCOPIC
Bacteria, UA: NONE SEEN
Bilirubin Urine: NEGATIVE
Glucose, UA: 50 mg/dL — AB
Hgb urine dipstick: NEGATIVE
Ketones, ur: NEGATIVE mg/dL
Nitrite: NEGATIVE
Protein, ur: NEGATIVE mg/dL
Specific Gravity, Urine: 1.014 (ref 1.005–1.030)
Trans Epithel, UA: 2
pH: 5 (ref 5.0–8.0)

## 2022-11-13 LAB — CBC
HCT: 33.3 % — ABNORMAL LOW (ref 36.0–46.0)
Hemoglobin: 10.2 g/dL — ABNORMAL LOW (ref 12.0–15.0)
MCH: 29.1 pg (ref 26.0–34.0)
MCHC: 30.6 g/dL (ref 30.0–36.0)
MCV: 94.9 fL (ref 80.0–100.0)
Platelets: 124 10*3/uL — ABNORMAL LOW (ref 150–400)
RBC: 3.51 MIL/uL — ABNORMAL LOW (ref 3.87–5.11)
RDW: 18.4 % — ABNORMAL HIGH (ref 11.5–15.5)
WBC: 5.3 10*3/uL (ref 4.0–10.5)
nRBC: 0.4 % — ABNORMAL HIGH (ref 0.0–0.2)

## 2022-11-13 LAB — BASIC METABOLIC PANEL
Anion gap: 6 (ref 5–15)
BUN: 72 mg/dL — ABNORMAL HIGH (ref 8–23)
CO2: 24 mmol/L (ref 22–32)
Calcium: 6.6 mg/dL — ABNORMAL LOW (ref 8.9–10.3)
Chloride: 109 mmol/L (ref 98–111)
Creatinine, Ser: 1.65 mg/dL — ABNORMAL HIGH (ref 0.44–1.00)
GFR, Estimated: 30 mL/min — ABNORMAL LOW (ref >60)
Glucose, Bld: 163 mg/dL — ABNORMAL HIGH (ref 70–99)
Potassium: 5.7 mmol/L — ABNORMAL HIGH (ref 3.5–5.1)
Sodium: 139 mmol/L (ref 135–145)

## 2022-11-13 LAB — RAD ONC ARIA SESSION SUMMARY
Course Elapsed Days: 3
Plan Fractions Treated to Date: 2
Plan Prescribed Dose Per Fraction: 3 Gy
Plan Total Fractions Prescribed: 10
Plan Total Prescribed Dose: 30 Gy
Reference Point Dosage Given to Date: 6 Gy
Reference Point Session Dosage Given: 3 Gy
Session Number: 2

## 2022-11-13 LAB — GLUCOSE, CAPILLARY
Glucose-Capillary: 148 mg/dL — ABNORMAL HIGH (ref 70–99)
Glucose-Capillary: 180 mg/dL — ABNORMAL HIGH (ref 70–99)
Glucose-Capillary: 253 mg/dL — ABNORMAL HIGH (ref 70–99)
Glucose-Capillary: 155 mg/dL — ABNORMAL HIGH (ref 70–99)

## 2022-11-13 LAB — MAGNESIUM: Magnesium: 3.6 mg/dL — ABNORMAL HIGH (ref 1.7–2.4)

## 2022-11-13 MED ORDER — CALCIUM GLUCONATE-NACL 2-0.675 GM/100ML-% IV SOLN
2.0000 g | Freq: Once | INTRAVENOUS | Status: AC
  Administered 2022-11-13: 2000 mg via INTRAVENOUS
  Filled 2022-11-13: qty 100

## 2022-11-13 MED ORDER — TORSEMIDE 20 MG PO TABS
20.0000 mg | ORAL_TABLET | Freq: Every day | ORAL | Status: DC
  Administered 2022-11-13 – 2022-11-16 (×4): 20 mg via ORAL
  Filled 2022-11-13 (×4): qty 1

## 2022-11-13 MED ORDER — AZITHROMYCIN 250 MG PO TABS
500.0000 mg | ORAL_TABLET | Freq: Every day | ORAL | Status: DC
  Administered 2022-11-13 – 2022-11-15 (×3): 500 mg via ORAL
  Filled 2022-11-13 (×3): qty 2

## 2022-11-13 NOTE — Progress Notes (Signed)
I saw Margaret Dyer this morning.  She is still having some problems with shortness of breath.  I am unsure if she is ever going to get better from this shortness of breath.  I just really hate the fact that she is just had a "plateau.".  She is getting radiation therapy to the back.  I do still think that she is going to be a candidate for kyphoplasty because she is not can be able to lie on her stomach.  Again I am not sure how much she really is eating.  However, back on 11/10/2022, her prealbumin was actually better than I would have thought at 23.  I am not sure as to why her potassium keeps going up.  Her BUN and creatinine are slowly improving.  The BUN is 72 and creatinine 1.65.  Her white cell count is 5.3.  Hemoglobin 10.2.  Platelet count 124,000.  I am not sure if she is ever be a candidate for immunotherapy if we cannot get her better.  She just is not that active.  I know that she is working with physical therapy.  I know that she will do a whole lot better if she was able to have kyphoplasty at L4.  However, given her status, I am just not sure that she is going to be able to tolerate a procedure.  I know that her age is clearly working against her.  I know that she does have good support from her family.  I know that she is getting great care from all the staff upon 6 E.  I am just not sure what else I can think of to try to help get her better.  Again she does not have much of an appetite.  I would like to try to get her on some appetite stimulant.  I do not know if she would be able to tolerate Marinol.  I would be worried about using Megace just because of the risk of thromboembolic disease.  She is on some low-dose Decadron.  I thought maybe this might help with her appetite a little bit.  She is getting breathing treatments.  I think this certainly would help [place] out.  Her vital signs show temperature of 97.9.  Pulse 63.  Blood pressure 137/78.  Her lungs sound pretty clear  bilaterally.  Cardiac exam regular rate and rhythm.  Abdomen is soft.  She has decent bowel sounds.  There is no guarding or rebound tenderness.  Extremities show some mild edema in the legs.  She has adequate range of motion in her lower extremities.  She does have decreased symmetrical strength.  Again, Margaret Dyer has metastatic hepatocellular carcinoma.  She has a markedly elevated alpha-fetoprotein level.  Her disease is in her liver, lungs and back.  She presented with back pain.  She has a pathologic fracture at L4.  She currently is getting radiation for this.  I think that is can be interesting and very instructed to see how she does this week.  Hopefully, she will improve her performance status.  If not, and again I am not sure we will ever be able to give her any type of systemic therapy for the liver cancer.  I do appreciate everybody's help.  I know this is incredibly challenging.  [name], MD  1 John 4:18

## 2022-11-13 NOTE — Progress Notes (Signed)
  Daily Progress Note   Patient Name: RHYLI DEPAULA       Date: 11/13/2022 DOB: 1935/07/27  Age: 87 y.o. MRN#: [id number] Attending Physician: Damita Lack, MD Primary Care Physician: Carollee Herter, Alferd Apa, DO Admit Date: 10/15/2022 Length of Stay: 19 days  Discussed care with primary hospitalist today. Patient continues to discuss ongoing goals for medical care with primary hospitalist as well as oncologist, Dr. Marin Olp. As goals for medical care currently determined, palliative care team will sign off. Please reach out if our team can be of further assistance in the future. Thank you for involving our team in patient's care.    Chelsea Aus, DO Palliative Care Provider PMT # [phone]

## 2022-11-13 NOTE — Progress Notes (Signed)
Physical Therapy Treatment Patient Details Name: Margaret Dyer MRN: [id number] DOB: 1935-03-23 Today's Date: 11/13/2022   History of Present Illness 87 yo female presents to [facility] on 11/21 with weakness, back/L hip pain, fall x1 month ago.  Pt found to have compression vs pathologic fracture of L4, L acetabular fx. CT scan of the chest/abdomen/pelvis shows multiple bilat pulmonary mets, hepatic mets. S/p liver mass biopsy 11/27, plan for KP on 11/30 but pt too ShOB, CXR shows R PNA. PMH significant for chronic respiratory failure on 3.5LO2 at baseline, DM2, HTN, HLD, chronic diastolic CHF, pulmonary hypertension, liver cirrhosis, CKD 3, renal cell carcinoma with left nephrectomy 2000, hypothyroidism, osteopenia, L hip IMN 2020.    PT Comments    Pt overall weaker than last session. She begrudgingly agrees to attempt to mobilize. Pt unable to stand  d/t pain and fatigue, expresses she is concerned that SNF is "too much" for her. Agree pt may have limited rehab potential, unsure what her goals really are at this time, she reports to PT she wants to rest, states she wants to rest; palliative has signed off. Will continue to follow in acute setting.  Recommendations for follow up therapy are one component of a multi-disciplinary discharge planning process, led by the attending physician.  Recommendations may be updated based on patient status, additional functional criteria and insurance authorization.  Follow Up Recommendations  Skilled nursing-short term rehab (<3 hours/day) Can patient physically be transported by private vehicle: No   Assistance Recommended at Discharge Frequent or constant Supervision/Assistance  Patient can return home with the following A lot of help with bathing/dressing/bathroom;Two people to help with walking and/or transfers;Assist for transportation;Help with stairs or ramp for entrance;Assistance with cooking/housework   Equipment Recommendations  None recommended by PT     Recommendations for Other Services       Precautions / Restrictions Precautions Precautions: Fall;Back Precaution Comments: back Required Braces or Orthoses: Other Brace Other Brace: LSO Restrictions Weight Bearing Restrictions: Yes LLE Weight Bearing: Partial weight bearing LLE Partial Weight Bearing Percentage or Pounds: none specified, ortho states "PWB with assistance of a walker"     Mobility  Bed Mobility Overal bed mobility: Needs Assistance Bed Mobility: Rolling Rolling: Min assist, Mod assist   Supine to sit: Mod assist Sit to supine: Mod assist   General bed mobility comments: assist for log roll, trunk elevation, dense cues needed. Once upright, able to static sit EOB. dsynpeci however SpO2 in 90s on 4L throughout    Transfers   Equipment used: Rolling walker (2 wheels) Transfers: Sit to/from Stand             General transfer comment: attempted to stand, unable with +1 assist; deferred furhter attempts for pt safety; able to laterall scoot along EOB/toward HOB with mod assist and use of pad to reposition    Ambulation/Gait                   Stairs             Wheelchair Mobility    Modified Rankin (Stroke Patients Only)       Balance   Sitting-balance support: No upper extremity supported, Feet supported Sitting balance-Leahy Scale: Fair       Standing balance-Leahy Scale: Zero                              Cognition Arousal/Alertness: Awake/alert Behavior During Therapy: [facility] for  tasks assessed/performed Overall Cognitive Status: Within Functional Limits for tasks assessed                                          Exercises      General Comments        Pertinent Vitals/Pain Pain Assessment Pain Assessment: Faces Faces Pain Scale: Hurts even more Pain Location: back, L hip area, diffuse Pain Descriptors / Indicators: Aching Pain Intervention(s): Limited activity within patient's  tolerance, Monitored during session, Repositioned    Home Living                          Prior Function            PT Goals (current goals can now be found in the care plan section) Acute Rehab PT Goals Patient Stated Goal: i want to rest PT Goal Formulation: With patient Time For Goal Achievement: 11/17/22 Potential to Achieve Goals: Fair Progress towards PT goals: Progressing toward goals    Frequency    Min 2X/week      PT Plan Current plan remains appropriate    Co-evaluation              AM-PAC PT "6 Clicks" Mobility   Outcome Measure  Help needed turning from your back to your side while in a flat bed without using bedrails?: A Lot Help needed moving from lying on your back to sitting on the side of a flat bed without using bedrails?: A Lot Help needed moving to and from a bed to a chair (including a wheelchair)?: Total Help needed standing up from a chair using your arms (e.g., wheelchair or bedside chair)?: Total Help needed to walk in hospital room?: Total Help needed climbing 3-5 steps with a railing? : Total 6 Click Score: 8    End of Session Equipment Utilized During Treatment: Gait belt;Oxygen Activity Tolerance: Patient limited by fatigue;Patient limited by pain Patient left: in bed;with call bell/phone within reach;with bed alarm set Nurse Communication: Mobility status PT Visit Diagnosis: Other abnormalities of gait and mobility (R26.89);Muscle weakness (generalized) (M62.81)     Time: 2820-6015 PT Time Calculation (min) (ACUTE ONLY): 17 min  Charges:  $Therapeutic Activity: 8-22 mins                     Baxter Flattery, PT  Acute Rehab Dept [facility]) [phone]  [facility] Weekend Pager [facility] only)  [phone]  11/13/2022    [facility] 11/13/2022, 3:57 PM

## 2022-11-13 NOTE — Progress Notes (Signed)
PROGRESS NOTE    Margaret Dyer  ZOX:[id number] DOB: [birth year] DOA: 10/05/2022 PCP: Ann Held, DO   Brief Narrative:  87 year old with history of DM2, HTN, HLD, diastolic CHF, pulmonary HTN, cirrhosis, CKD stage IIIa, renal cell carcinoma with left nephrectomy in 2000, hypothyroidism presented to hospital with intractable back pain.  CT scan showed compression fracture of L4 with innumerable bilateral pulmonary metastases, posterior left acetabular wall fracture.  MRI confirmed these findings and patient was admitted to the hospital.  Radiology and oncology were consulted.  IR procedure for kyphoplasty or biopsy was postponed as patient was unable to lay flat.  Patient was transferred to [facility] for radiation treatments.  Since being here patient is getting pain control, Chemo-Port placed on 12/8.  Hospital course complicated by hyperkalemia.  Due to some respiratory distress, CT chest was done which showed signs of volume overload, some reduction in pulmonary nodules.  Assessment & Plan:  Principal Problem:   Intractable pain Active Problems:   Hyperlipidemia   Gout, unspecified   Essential hypertension   Asthma   Anxiety and depression   Platelets decreased (HCC)   Diabetes mellitus type 2, insulin dependent (HCC)   History of nephrectomy   Liver cirrhosis secondary to NASH (HCC)   Thrombocytopenia (HCC)   Hypothyroidism   Chronic hypoxemic respiratory failure (HCC)   Chronic diastolic CHF (congestive heart failure) (HCC)   Pulmonary hypertension (HCC)   Chronic renal disease, stage 4, severely decreased glomerular filtration rate (GFR) between 15-29 mL/min/1.73 square meter (HCC)   OSA (obstructive sleep apnea)   Restrictive lung disease   Pathological fracture   Hepatocellular carcinoma metastatic to lung (HCC)    Intractable back pain with compression fracture of L4 with concern for pathological fracture and metastasis MRI, CT of the pelvis showed compression  fracture of L4, pathological with 30% vertebral height loss.  Kyphoplasty and any kind of biopsy currently on hold as patient is unable to tolerate this due to pain and discomfort.  Radiation treatments to be started, total 10 per radiology.  Status post Chemo-Port placement 12/8 and started her radiation 12/8, tentative plans for 10 treatments total  Innumerable bilateral pulmonary metastasis History of renal cell CA with left nephrectomy CT scan obtained on admission showed innumerable bilateral pulmonary metastases as well as possible liver mets and bone mets.  Oncology suspecting metastatic hepatocellular carcinoma due to elevated AFP.  Now on radiation treatments (anticipate total 10) and eventually outpatient immunotherapy.  Will defer these discussions to oncology team. 11/27, underwent liver biopsy -showed cirrhosis but no malignant disease On steroids started by oncology  Abnormal breath sounds with signs of volume overload Anasarca - CT chest shows bilateral pleural effusion, ascites, and some reduction in pulmonary nodules -Elevated BNP.  Procalcitonin 0.5 - Resume Demadex 20 mg daily  Hyperkalemia Elevated BUN - Continue Lokelma.  Potassium slowly improving -BUN can be elevated from mild renal dysfunction and steroids  Hypocalcemia Replete as necessary    CKD stage IV history of renal cell carcinoma with left  nephrectomy Baseline creatinine close to 1.8, creatinine today 1.65   PNA Treated with 5 days of IV meropenem 12/1-12/5   Posterior left acetabular wall fracture of indeterminate age CT of the left hip showed small fracture of posterior acetabular wall of indeterminate age 33/22, seen by [facility], recommended PWB with assistance of a walker, Follow in office as outpt 3 weeks for repeat xrays   Klebsiella UTI Treated earlier during her hospitalization  Type 2 diabetes mellitus, intermittent hypoglycemia due to steroids A1c 6.4 on 10/25/2022 SSI.   Semglee 9 units daily   COPD /Chronic hypoxic respiratory failure  on home O2 3.5liters Bronchodilators scheduled and as necessary   Essential hypertension Currently on Coreg 12.5 mg twice daily, hydralazine, Demadex.  IV as needed HLD On Zetia and Zocor   Hypothyroidism On Synthroid 25 mcg daily on weekdays. Continue the same.    Thrombocytopenia, chronic Does has ecchymosis ,but no overt bleeding Management per hematology oncology Dr. Marin Olp   Failure to thrive, adult Secondary to cancer.  Appreciate input from palliative care Dietitian consulted   Obesity Estimated body mass index is 37.57 kg/m as calculated from the following:   Height as of this encounter: _0  (1.6 m).   Weight as of this encounter: 96.2 kg.    Code Status: Partial Code   Patient has been seen by palliative care team.  Appreciate their input.  Unfortunately given multiple comorbidities, advanced malignancy, advanced age-she overall has poor prognosis and likely poor quality of life.  For now patient is getting radiation to help control her pain and eventual plans for immunotherapy PT-SNF Very poor oral nutrition -difficult to manage her fluid status  DVT prophylaxis: Subcu heparin Code Status: Partial code-DNI Family Communication: Daughter updated at bedside  Status is: Inpatient Maintain hospital stay for pain control and getting radiation treatments.   Nutritional status    Signs/Symptoms: estimated needs  Interventions: Ensure Enlive (each supplement provides 350kcal and 20 grams of protein), Refer to RD note for recommendations  Body mass index is 38.7 kg/m.      Subjective: Feels ok, overall still very weak.  Still has off and on back pain.   Examination: Constitutional: Not in acute distress; 2L. Frail  Respiratory: b/l rhonchi Cardiovascular: Normal sinus rhythm, no rubs Abdomen: Nontender nondistended good bowel sounds Musculoskeletal: No edema noted Skin: No rashes  seen Neurologic: CN 2-12 grossly intact.  And nonfocal Psychiatric: Normal judgment and insight. Alert and oriented x 3. Normal mood.   Chemo-Port in place in right chest wall External female catheter  Objective: Vitals:   11/12/22 2020 11/12/22 2024 11/13/22 0500 11/13/22 0518  BP: (!) 121/48   126/67  Pulse: 60   62  Resp: 20   18  Temp: 98.3 F (36.8 C)   98 F (36.7 C)  TempSrc: Oral   Oral  SpO2: 99% 98%  99%  Weight:   99.1 kg   Height:        Intake/Output Summary (Last 24 hours) at 11/13/2022 0723 Last data filed at 11/13/2022 0556 Gross per 24 hour  Intake 976.48 ml  Output 1075 ml  Net -98.52 ml    Filed Weights   11/11/22 0531 11/12/22 0426 11/13/22 0500  Weight: 97.8 kg 98.7 kg 99.1 kg     Data Reviewed:   CBC: Recent Labs  Lab 11/07/22 0419 11/08/22 0604 11/09/22 0639 11/10/22 0621 11/11/22 0729 11/12/22 0500 11/13/22 0523  WBC 3.4* 6.4 5.7 4.7 4.5 4.1 5.3  NEUTROABS 2.9 5.8  --   --   --   --   --   HGB 11.9* 11.0* 11.0* 10.4* 10.9* 10.5* 10.2*  HCT 38.8 37.1 37.4 35.1* 36.2 34.6* 33.3*  MCV 94.2 96.6 98.2 96.7 95.0 96.1 94.9  PLT 107* 120* 118* 113* 118* 118* 924*   Basic Metabolic Panel: Recent Labs  Lab 11/09/22 0639 11/09/22 1011 11/10/22 0621 11/11/22 0729 11/12/22 0500 11/13/22 0523  NA 136  --  134* 140 140 139  K 6.1* 5.7* 5.2* 5.6* 5.8* 5.7*  CL 107  --  104 109 110 109  CO2 21*  --  21* _0 GLUCOSE 196*  --  194* 152* 164* 163*  BUN 70*  --  76* 87* 90* 72*  CREATININE 2.09*  --  1.90* 2.02* 1.80* 1.65*  CALCIUM 6.4*  --  6.5* 6.9* 6.6* 6.6*  MG 3.2*  --  3.3* 3.4* 3.5* 3.6*   GFR: Estimated Creatinine Clearance: 28 mL/min (A) (by C-G formula based on SCr of 1.65 mg/dL (H)). Liver Function Tests: Recent Labs  Lab 11/07/22 0419 11/08/22 0604 11/09/22 0639 11/10/22 0621 11/11/22 0729  AST 29 30 33 31 34  ALT _1 ALKPHOS 122 112 106 88 88  BILITOT 0.6 0.5 0.6 0.6 0.8  PROT 5.8* 6.3* 6.1*  5.4* 6.0*  ALBUMIN 2.6* 2.9* 2.9* 2.6* 2.9*   No results for input(s): "LIPASE", "AMYLASE" in the last 168 hours. No results for input(s): "AMMONIA" in the last 168 hours. Coagulation Profile: No results for input(s): "INR", "PROTIME" in the last 168 hours.  Cardiac Enzymes: No results for input(s): "CKTOTAL", "CKMB", "CKMBINDEX", "TROPONINI" in the last 168 hours. BNP (last 3 results) No results for input(s): "PROBNP" in the last 8760 hours. HbA1C: No results for input(s): "HGBA1C" in the last 72 hours. CBG: Recent Labs  Lab 11/12/22 0725 11/12/22 1110 11/12/22 1614 11/12/22 2028 11/13/22 0710  GLUCAP 156* 194* 286* 189* 148*   Lipid Profile: No results for input(s): "CHOL", "HDL", "LDLCALC", "TRIG", "CHOLHDL", "LDLDIRECT" in the last 72 hours. Thyroid Function Tests: No results for input(s): "TSH", "T4TOTAL", "FREET4", "T3FREE", "THYROIDAB" in the last 72 hours.  Anemia Panel: No results for input(s): "VITAMINB12", "FOLATE", "FERRITIN", "TIBC", "IRON", "RETICCTPCT" in the last 72 hours.  Sepsis Labs: Recent Labs  Lab 11/11/22 0752  PROCALCITON 0.52    Recent Results (from the past 240 hour(s))  Respiratory (~20 pathogens) panel by PCR     Status: None   Collection Time: 11/06/22  9:14 AM   Specimen: Nasopharyngeal Swab; Respiratory  Result Value Ref Range Status   Adenovirus NOT DETECTED NOT DETECTED Final   Coronavirus 229E NOT DETECTED NOT DETECTED Final    Comment: (NOTE) The Coronavirus on the Respiratory Panel, DOES NOT test for the novel  Coronavirus (2019 nCoV)    Coronavirus HKU1 NOT DETECTED NOT DETECTED Final   Coronavirus NL63 NOT DETECTED NOT DETECTED Final   Coronavirus OC43 NOT DETECTED NOT DETECTED Final   Metapneumovirus NOT DETECTED NOT DETECTED Final   Rhinovirus / Enterovirus NOT DETECTED NOT DETECTED Final   Influenza A NOT DETECTED NOT DETECTED Final   Influenza B NOT DETECTED NOT DETECTED Final   Parainfluenza Virus 1 NOT DETECTED NOT  DETECTED Final   Parainfluenza Virus 2 NOT DETECTED NOT DETECTED Final   Parainfluenza Virus 3 NOT DETECTED NOT DETECTED Final   Parainfluenza Virus 4 NOT DETECTED NOT DETECTED Final   Respiratory Syncytial Virus NOT DETECTED NOT DETECTED Final   Bordetella pertussis NOT DETECTED NOT DETECTED Final   Bordetella Parapertussis NOT DETECTED NOT DETECTED Final   Chlamydophila pneumoniae NOT DETECTED NOT DETECTED Final   Mycoplasma pneumoniae NOT DETECTED NOT DETECTED Final    Comment: Performed at [facility], [place]. [address], [place], [place] [address]         Radiology Studies: CT CHEST WO CONTRAST  Result Date: 11/11/2022 CLINICAL DATA:  Multiple lung nodules, pneumonia  versus edema versus progressive cancer, metastatic renal cell carcinoma * Tracking Code: BO * EXAM: CT CHEST WITHOUT CONTRAST TECHNIQUE: Multidetector CT imaging of the chest was performed following the standard protocol without IV contrast. RADIATION DOSE REDUCTION: This exam was performed according to the departmental dose-optimization program which includes automated exposure control, adjustment of the mA and/or kV according to patient size and/or use of iterative reconstruction technique. COMPARISON:  10/25/2022 FINDINGS: Cardiovascular: Right chest port catheter. Aortic atherosclerosis. Cardiomegaly. Three-vessel coronary artery calcifications. No pericardial effusion. Mediastinum/Nodes: No enlarged mediastinal, hilar, or axillary lymph nodes. Thyroid gland, trachea, and esophagus demonstrate no significant findings. Lungs/Pleura: Moderate bilateral pleural effusions and associated atelectasis or consolidation, worsened compared to prior examination. Numerous bilateral pulmonary nodules are diminished in size compared to prior examination, nodule of the anterior right upper lobe measuring 1.7 x 1.4 cm, previously 2.9 x 2.1 cm (series 7, image 43). Additional index nodule of the posterior left upper lobe measures 2.4 x 2.0  cm, previously 4.1 x 3.3 cm (series 7, image 28). Upper Abdomen: New, small volume ascites in the included upper abdomen. Cirrhosis. Partially imaged left nephrectomy site. Musculoskeletal: No chest wall abnormality. No acute osseous findings. IMPRESSION: 1. Moderate bilateral pleural effusions and associated atelectasis or consolidation, worsened compared to prior examination. 2. Numerous bilateral pulmonary nodules are diminished in size compared to prior examination, consistent with treatment response. 3. New, small volume ascites in the included upper abdomen. 4. Cirrhosis. 5. Cardiomegaly and coronary artery disease. Aortic Atherosclerosis (ICD10-I70.0). Electronically Signed   By: Delanna Ahmadi M.D.   On: 11/11/2022 12:36        Scheduled Meds:  carvedilol  12.5 mg Oral BID WC   Chlorhexidine Gluconate Cloth  6 each Topical Daily   dexamethasone  4 mg Oral Q12H   docusate sodium  100 mg Oral BID   feeding supplement  237 mL Oral TID BM   fentaNYL  1 patch Transdermal Q72H   heparin  5,000 Units Subcutaneous Q8H   hydrALAZINE  50 mg Oral Q8H   insulin aspart  0-5 Units Subcutaneous QHS   insulin aspart  0-9 Units Subcutaneous TID WC   insulin aspart  2 Units Subcutaneous TID WC   insulin glargine-yfgn  9 Units Subcutaneous QHS   levothyroxine  25 mcg Oral Q MTWThF   lidocaine  1 patch Transdermal QHS   multivitamin with minerals  1 tablet Oral Daily   oxybutynin  5 mg Oral Daily   senna-docusate  1 tablet Oral QHS   sodium zirconium cyclosilicate  10 g Oral BID   Continuous Infusions:  sodium chloride     azithromycin Stopped (11/12/22 1000)   calcium gluconate       LOS: 19 days   Time spent= 35 mins    Olney Monier Arsenio Loader, MD [facility]  If 7PM-7AM, please contact night-coverage  11/13/2022, 7:23 AM

## 2022-11-13 NOTE — Progress Notes (Signed)
Nutrition Follow-up  INTERVENTION:   -Ensure Plus High Protein po TID, each supplement provides 350 kcal and 20 grams of protein.   -Multivitamin with minerals daily  -"High Calorie, High Protein Nutrition Therapy" handout added to AVS   NUTRITION DIAGNOSIS:   Increased nutrient needs related to acute illness, chronic illness as evidenced by estimated needs.  Ongoing.  GOAL:   Patient will meet greater than or equal to 90% of their needs  Progressing.  MONITOR:   PO intake, Supplement acceptance, Labs, Weight trends  REASON FOR ASSESSMENT:   Consult Assessment of nutrition requirement/status  ASSESSMENT:   Pt admitted with intractable back pain. PMH significant for chronic respiratory failure, CHF, renal cell carcinoma s/p L nephrectomy (2000), liver cirrhosis, diabetes.  11/21: admitted 12/8: s/p port placement  Patient continues to drink Ensure supplements. Pt doesn't like the hospital food but family has been able to bring in some food for her. PO completions between 20-50%. K labs have been elevated, trending down. On Lokelma.   Admission weight: 182 lbs Current weight: 218 lbs Per nursing documentation, pt with mild BLE edema.   Medications: Colace, insulin, Multivitamin with minerals daily, Senokot, Lokelma, Demadex, calcium gluconate  Labs reviewed: CBGs: 117-286 Elevated K (5.7) Elevated Mg (3.6)   Diet Order:   Diet Order             Diet regular Room service appropriate? Yes; Fluid consistency: Thin  Diet effective now                   EDUCATION NEEDS:   Education needs have been addressed  Skin:  Skin Assessment: Reviewed RN Assessment  Last BM:  12/10 -type 4  Height:   Ht Readings from Last 1 Encounters:  10/21/2022 _0  (1.6 m)    Weight:   Wt Readings from Last 1 Encounters:  11/13/22 99.1 kg    BMI:  Body mass index is 38.7 kg/m.  Estimated Nutritional Needs:   Kcal:  1800-2000  Protein:  90-105g  Fluid:   >/=1.8L  Clayton Bibles, MS, RD, LDN Inpatient Clinical Dietitian Contact information available via Amion

## 2022-11-13 NOTE — Care Management Important Message (Signed)
Important Message  Patient Details IM Letter placed in Patient's room. Name: Margaret Dyer MRN: [id number] Date of Birth: 06/13/1935   Medicare Important Message Given:  Yes     [name], [name] 11/13/2022, 10:41 AM

## 2022-11-13 NOTE — Progress Notes (Signed)
PHARMACIST - PHYSICIAN COMMUNICATION DR:   Marin Olp CONCERNING: Antibiotic IV to Oral Route Change Policy  RECOMMENDATION: This patient is receiving Azithromycin by the intravenous route.  Based on criteria approved by the Pharmacy and Therapeutics Committee, the antibiotic(s) is/are being converted to the equivalent oral dose form(s).   DESCRIPTION: These criteria include: Patient being treated for a respiratory tract infection, urinary tract infection, cellulitis or clostridium difficile associated diarrhea if on metronidazole The patient is not neutropenic and does not exhibit a GI malabsorption state The patient is eating (either orally or via tube) and/or has been taking other orally administered medications for a least 24 hours The patient is improving clinically and has a Tmax < 100.5  If you have questions about this conversion, please contact the Pharmacy Department  _0   [phone] )  Margaret Dyer _1   [phone] )  Margaret Dyer _2   [phone] )  Margaret Dyer _3   [phone] )  [facility] _4   [phone] )  [place], PharmD, BCPS 11/13/2022 7:33 AM

## 2022-11-14 ENCOUNTER — Inpatient Hospital Stay (HOSPITAL_COMMUNITY): Payer: Medicare Other

## 2022-11-14 ENCOUNTER — Other Ambulatory Visit: Payer: Self-pay

## 2022-11-14 ENCOUNTER — Ambulatory Visit: Payer: Medicare Other

## 2022-11-14 ENCOUNTER — Ambulatory Visit
Admit: 2022-11-14 | Discharge: 2022-11-14 | Disposition: A | Discharge status: Home / Self Care | Payer: Medicare Other | Attending: Radiation Oncology | Admitting: Radiation Oncology

## 2022-11-14 DIAGNOSIS — R52 Pain, unspecified: Secondary | ICD-10-CM | POA: Diagnosis not present

## 2022-11-14 DIAGNOSIS — C220 Liver cell carcinoma: Secondary | ICD-10-CM | POA: Diagnosis not present

## 2022-11-14 DIAGNOSIS — C7951 Secondary malignant neoplasm of bone: Secondary | ICD-10-CM | POA: Diagnosis not present

## 2022-11-14 DIAGNOSIS — Z510 Encounter for antineoplastic radiation therapy: Secondary | ICD-10-CM | POA: Diagnosis not present

## 2022-11-14 LAB — CBC
HCT: 34.6 % — ABNORMAL LOW (ref 36.0–46.0)
Hemoglobin: 10.7 g/dL — ABNORMAL LOW (ref 12.0–15.0)
MCH: 29.4 pg (ref 26.0–34.0)
MCHC: 30.9 g/dL (ref 30.0–36.0)
MCV: 95.1 fL (ref 80.0–100.0)
Platelets: 115 10*3/uL — ABNORMAL LOW (ref 150–400)
RBC: 3.64 MIL/uL — ABNORMAL LOW (ref 3.87–5.11)
RDW: 18.3 % — ABNORMAL HIGH (ref 11.5–15.5)
WBC: 5.4 10*3/uL (ref 4.0–10.5)
nRBC: 0.4 % — ABNORMAL HIGH (ref 0.0–0.2)

## 2022-11-14 LAB — RAD ONC ARIA SESSION SUMMARY
Course Elapsed Days: 4
Plan Fractions Treated to Date: 3
Plan Prescribed Dose Per Fraction: 3 Gy
Plan Total Fractions Prescribed: 10
Plan Total Prescribed Dose: 30 Gy
Reference Point Dosage Given to Date: 9 Gy
Reference Point Session Dosage Given: 3 Gy
Session Number: 3

## 2022-11-14 LAB — BASIC METABOLIC PANEL
Anion gap: 7 (ref 5–15)
BUN: 84 mg/dL — ABNORMAL HIGH (ref 8–23)
CO2: 25 mmol/L (ref 22–32)
Calcium: 7 mg/dL — ABNORMAL LOW (ref 8.9–10.3)
Chloride: 107 mmol/L (ref 98–111)
Creatinine, Ser: 1.44 mg/dL — ABNORMAL HIGH (ref 0.44–1.00)
GFR, Estimated: 36 mL/min — ABNORMAL LOW (ref >60)
Glucose, Bld: 194 mg/dL — ABNORMAL HIGH (ref 70–99)
Potassium: 5.3 mmol/L — ABNORMAL HIGH (ref 3.5–5.1)
Sodium: 139 mmol/L (ref 135–145)

## 2022-11-14 LAB — GLUCOSE, CAPILLARY
Glucose-Capillary: 176 mg/dL — ABNORMAL HIGH (ref 70–99)
Glucose-Capillary: 208 mg/dL — ABNORMAL HIGH (ref 70–99)
Glucose-Capillary: 254 mg/dL — ABNORMAL HIGH (ref 70–99)
Glucose-Capillary: 175 mg/dL — ABNORMAL HIGH (ref 70–99)

## 2022-11-14 LAB — MAGNESIUM: Magnesium: 3.3 mg/dL — ABNORMAL HIGH (ref 1.7–2.4)

## 2022-11-14 MED ORDER — CALCIUM GLUCONATE-NACL 2-0.675 GM/100ML-% IV SOLN
2.0000 g | Freq: Once | INTRAVENOUS | Status: AC
  Administered 2022-11-14: 2000 mg via INTRAVENOUS
  Filled 2022-11-14: qty 100

## 2022-11-14 MED ORDER — IPRATROPIUM-ALBUTEROL 0.5-2.5 (3) MG/3ML IN SOLN
3.0000 mL | Freq: Four times a day (QID) | RESPIRATORY_TRACT | Status: DC
  Administered 2022-11-14 – 2022-11-15 (×2): 3 mL via RESPIRATORY_TRACT
  Filled 2022-11-14 (×2): qty 3

## 2022-11-14 NOTE — Progress Notes (Signed)
Ms. Enneking says she feels a little bit better today.  She is doing well with radiation therapy.  She still has little bit of a cough.  We will check a chest x-ray on her today.  I think this would be reasonable to do.  She still is weak.  I am not sure how much she is eating.  I think her daughter was able to come up to see her.   She probably got over her sickness.  I thought she brought some food up for Ms. Jerez.  I am glad to see that her renal function is doing a bit better.  Her creatinine is 1.44.  The BUN is elevated.  This may be secondary to steroids.  Her calcium is only 7.  Her albumin a few days ago was 2.9.  Her white cell count 5.4.  Hemoglobin 10.7.  Platelet count 115,000.  Of note, is that she does have a history of NASH.  I do not think will be a bad idea to get an echocardiogram on her.  Her last 1 was done over a year ago.  I think that try to get physical therapy to help her is critical.  I am glad that they went to see her yesterday.  Did not sound like she did as well as she used to.  I do still think that she will be able to go home when she is ready after radiation therapy.  I do still think she is strong enough to our independent enough to go home.  On her physical exam, her temperature is 97.9.  Pulse 70.  Blood pressure 129/50.  Weight is 228 pounds.  Her lungs sound okay bilaterally.  I do not hear any wheezing.  She has decent air movement bilaterally.  Cardiac exam regular rate and rhythm.  I really cannot hear a murmur.  Abdomen is soft.  She has decent bowel sounds.  There is no fluid wave.  There is no guarding or rebound tenderness.  Extremities shows no clubbing, cyanosis or edema.  She has symmetric weakness in arms and legs.  Skin exam shows scattered ecchymoses.  Neurological exam is nonfocal.  Ms. Pita has metastatic hepatocellular carcinoma.  She is not in the best of shape.  We are trying to improve her performance status to try to see if we cannot do  systemic therapy of on her.  She currently is getting radiation therapy.  I do still see how she will be able to come back and forth for radiation as an outpatient.  She is just weak.  She needs a lot of therapy to try to improve her status.  I know that everybody on 6 E. they are working really hard with her.  I appreciate all of the efforts.     [name], MD  Psalm 41:3

## 2022-11-14 NOTE — Progress Notes (Signed)
PROGRESS NOTE    Margaret Dyer  KVQ:[id number] DOB: 06-Nov-1935 DOA: 10/04/2022 PCP: Ann Held, DO   Brief Narrative:  87 year old with history of DM2, HTN, HLD, diastolic CHF, pulmonary HTN, cirrhosis, CKD stage IIIa, renal cell carcinoma with left nephrectomy in 2000, hypothyroidism presented to hospital with intractable back pain.  CT scan showed compression fracture of L4 with innumerable bilateral pulmonary metastases, posterior left acetabular wall fracture.  MRI confirmed these findings and patient was admitted to the hospital.  Radiology and oncology were consulted.  IR procedure for kyphoplasty or biopsy was postponed as patient was unable to lay flat.  Patient was transferred to [facility] for radiation treatments.  Since being here patient is getting pain control, Chemo-Port placed on 12/8.  Hospital course complicated by hyperkalemia.  Due to some respiratory distress, CT chest was done which showed signs of volume overload, some reduction in pulmonary nodules.  Her respiratory status, hydration and renal function has been very tenuous therefore off-and-on requiring IV fluids and diuretics.  Assessment & Plan:  Principal Problem:   Intractable pain Active Problems:   Hyperlipidemia   Gout, unspecified   Essential hypertension   Asthma   Anxiety and depression   Platelets decreased (HCC)   Diabetes mellitus type 2, insulin dependent (HCC)   History of nephrectomy   Liver cirrhosis secondary to NASH (HCC)   Thrombocytopenia (HCC)   Hypothyroidism   Chronic hypoxemic respiratory failure (HCC)   Chronic diastolic CHF (congestive heart failure) (HCC)   Pulmonary hypertension (HCC)   Chronic renal disease, stage 4, severely decreased glomerular filtration rate (GFR) between 15-29 mL/min/1.73 square meter (HCC)   OSA (obstructive sleep apnea)   Restrictive lung disease   Pathological fracture   Hepatocellular carcinoma metastatic to lung (HCC)    Intractable back  pain with compression fracture of L4 with concern for pathological fracture and metastasis MRI, CT of the pelvis showed compression fracture of L4, pathological with 30% vertebral height loss.  Kyphoplasty and any kind of biopsy currently on hold as patient is unable to tolerate this due to pain and discomfort.  Radiation treatments to be started, total 10 per radiology.  Status post Chemo-Port placement 12/8 and started her radiation 12/8, tentative plans for total 10 treatments  Innumerable bilateral pulmonary metastasis History of renal cell CA with left nephrectomy CT scan obtained on admission showed innumerable bilateral pulmonary metastases as well as possible liver mets and bone mets.  Oncology suspecting metastatic hepatocellular carcinoma due to elevated AFP.  Now on radiation treatments (anticipate total 10) and eventually outpatient immunotherapy.  Will defer these discussions to oncology team. 11/27, underwent liver biopsy -showed cirrhosis but no malignant disease On steroids per oncology  Abnormal breath sounds with signs of volume overload Anasarca - CT chest shows bilateral pleural effusion, ascites, and some reduction in pulmonary nodules -Elevated BNP.  Procalcitonin 0.5 - Demadex resumed.  Getting bronchodilators  Hyperkalemia Elevated BUN - Potassium and creatinine slowly improving.  Continue Lokelma for another day -BUN can be elevated from mild renal dysfunction and steroids  Hypocalcemia Will give another dose of calcium gluconate    CKD stage IV history of renal cell carcinoma with left  nephrectomy Baseline creatinine close to 1.7, creatinine 1.4.  It has been a battle to try and manage her fluid status between getting diuretics, fluid, p.o. intake, poor nutrition and issues with third spacing.   PNA Treated with 5 days of IV meropenem 12/1-12/5   Posterior left acetabular  wall fracture of indeterminate age CT of the left hip showed small fracture of  posterior acetabular wall of indeterminate age 16/22, seen by [facility], recommended PWB with assistance of a walker, Follow in office as outpt 3 weeks for repeat xrays   Klebsiella UTI Treated earlier during her hospitalization    Type 2 diabetes mellitus, intermittent hypoglycemia due to steroids A1c 6.4 on 10/25/2022 On sliding scale.  Semglee 9 units at bedtime, NovoLog 2 units premeals   COPD /Chronic hypoxic respiratory failure  on home O2 3.5liters Bronchodilators scheduled and as necessary   Essential hypertension Currently on Coreg 12.5 mg twice daily, hydralazine, Demadex.  IV as needed  HLD On Zetia and Zocor   Hypothyroidism On Synthroid 25 mcg daily on weekdays. Continue the same.    Thrombocytopenia, chronic Does has ecchymosis ,but no overt bleeding Management per hematology oncology Dr. Marin Olp   Failure to thrive, adult Secondary to cancer.  Appreciate input from palliative care Dietitian consulted   Obesity Estimated body mass index is 37.57 kg/m as calculated from the following:   Height as of this encounter: _0  (1.6 m).   Weight as of this encounter: 96.2 kg.    Code Status: Partial Code   Patient has been seen by palliative care team.  Appreciate their input.  Unfortunately given multiple comorbidities, advanced malignancy, advanced age-she overall has poor prognosis and likely poor quality of life.  For now patient is getting radiation to help control her pain and eventual plans for immunotherapy PT-SNF Very poor oral nutrition -difficult to manage her fluid status Mobility is also a major concern, we need to keep her getting out of bed to chair  DVT prophylaxis: Subcu heparin Code Status: Partial code-DNI Family Communication: Daughter updated at bedside 12/11  Status is: Inpatient Maintain hospital stay for pain control and getting radiation treatments.  Eventually SNF   Nutritional status    Signs/Symptoms: estimated  needs  Interventions: Ensure Enlive (each supplement provides 350kcal and 20 grams of protein), Refer to RD note for recommendations  Body mass index is 40.46 kg/m.      Subjective: Patient seen and examined at bedside.  Eating her breakfast.  No complaints.  She understands that she really needs to get out of bed to chair  Examination:  Constitutional: Not in acute distress.  Appears chronically ill, on 3 L nasal cannula Respiratory: Bibasilar crackles Cardiovascular: Normal sinus rhythm, no rubs Abdomen: Nontender nondistended good bowel sounds Musculoskeletal: No edema noted Skin: No rashes seen Neurologic: CN 2-12 grossly intact.  And nonfocal Psychiatric: Normal judgment and insight. Alert and oriented x 3. Normal mood. Chemo-Port in place in right chest wall External female catheter  Objective: Vitals:   11/13/22 1321 11/13/22 2046 11/13/22 2311 11/14/22 0537  BP: 137/78 (!) 107/55  (!) 125/49  Pulse: 63 65  62  Resp: _1 Temp: 97.9 F (36.6 C) 98 F (36.7 C)  97.9 F (36.6 C)  TempSrc: Oral Oral  Oral  SpO2: 96% 97% 99% 98%  Weight:    103.6 kg  Height:        Intake/Output Summary (Last 24 hours) at 11/14/2022 0747 Last data filed at 11/14/2022 0370 Gross per 24 hour  Intake 340 ml  Output 1250 ml  Net -910 ml    Filed Weights   11/12/22 0426 11/13/22 0500 11/14/22 0537  Weight: 98.7 kg 99.1 kg 103.6 kg     Data Reviewed:   CBC: Recent Labs  Lab 11/08/22 0604 11/09/22 0639 11/10/22 0621 11/11/22 0729 11/12/22 0500 11/13/22 0523 11/14/22 0500  WBC 6.4   < > 4.7 4.5 4.1 5.3 5.4  NEUTROABS 5.8  --   --   --   --   --   --   HGB 11.0*   < > 10.4* 10.9* 10.5* 10.2* 10.7*  HCT 37.1   < > 35.1* 36.2 34.6* 33.3* 34.6*  MCV 96.6   < > 96.7 95.0 96.1 94.9 95.1  PLT 120*   < > 113* 118* 118* 124* 115*   < > = values in this interval not displayed.   Basic Metabolic Panel: Recent Labs  Lab 11/10/22 0621 11/11/22 0729 11/12/22 0500  11/13/22 0523 11/14/22 0500  NA 134* 140 140 139 139  K 5.2* 5.6* 5.8* 5.7* 5.3*  CL 104 109 110 109 107  CO2 21* _0 GLUCOSE 194* 152* 164* 163* 194*  BUN 76* 87* 90* 72* 84*  CREATININE 1.90* 2.02* 1.80* 1.65* 1.44*  CALCIUM 6.5* 6.9* 6.6* 6.6* 7.0*  MG 3.3* 3.4* 3.5* 3.6* 3.3*   GFR: Estimated Creatinine Clearance: 32.9 mL/min (A) (by C-G formula based on SCr of 1.44 mg/dL (H)). Liver Function Tests: Recent Labs  Lab 11/08/22 0604 11/09/22 0639 11/10/22 0621 11/11/22 0729  AST 30 33 31 34  ALT _1 ALKPHOS 112 106 88 88  BILITOT 0.5 0.6 0.6 0.8  PROT 6.3* 6.1* 5.4* 6.0*  ALBUMIN 2.9* 2.9* 2.6* 2.9*   No results for input(s): "LIPASE", "AMYLASE" in the last 168 hours. No results for input(s): "AMMONIA" in the last 168 hours. Coagulation Profile: No results for input(s): "INR", "PROTIME" in the last 168 hours.  Cardiac Enzymes: No results for input(s): "CKTOTAL", "CKMB", "CKMBINDEX", "TROPONINI" in the last 168 hours. BNP (last 3 results) No results for input(s): "PROBNP" in the last 8760 hours. HbA1C: No results for input(s): "HGBA1C" in the last 72 hours. CBG: Recent Labs  Lab 11/13/22 0710 11/13/22 1122 11/13/22 1615 11/13/22 2047 11/14/22 0727  GLUCAP 148* 180* 253* 155* 176*   Lipid Profile: No results for input(s): "CHOL", "HDL", "LDLCALC", "TRIG", "CHOLHDL", "LDLDIRECT" in the last 72 hours. Thyroid Function Tests: No results for input(s): "TSH", "T4TOTAL", "FREET4", "T3FREE", "THYROIDAB" in the last 72 hours.  Anemia Panel: No results for input(s): "VITAMINB12", "FOLATE", "FERRITIN", "TIBC", "IRON", "RETICCTPCT" in the last 72 hours.  Sepsis Labs: Recent Labs  Lab 11/11/22 0752  PROCALCITON 0.52    Recent Results (from the past 240 hour(s))  Respiratory (~20 pathogens) panel by PCR     Status: None   Collection Time: 11/06/22  9:14 AM   Specimen: Nasopharyngeal Swab; Respiratory  Result Value Ref Range Status   Adenovirus  NOT DETECTED NOT DETECTED Final   Coronavirus 229E NOT DETECTED NOT DETECTED Final    Comment: (NOTE) The Coronavirus on the Respiratory Panel, DOES NOT test for the novel  Coronavirus (2019 nCoV)    Coronavirus HKU1 NOT DETECTED NOT DETECTED Final   Coronavirus NL63 NOT DETECTED NOT DETECTED Final   Coronavirus OC43 NOT DETECTED NOT DETECTED Final   Metapneumovirus NOT DETECTED NOT DETECTED Final   Rhinovirus / Enterovirus NOT DETECTED NOT DETECTED Final   Influenza A NOT DETECTED NOT DETECTED Final   Influenza B NOT DETECTED NOT DETECTED Final   Parainfluenza Virus 1 NOT DETECTED NOT DETECTED Final   Parainfluenza Virus 2 NOT DETECTED NOT DETECTED Final   Parainfluenza Virus 3 NOT DETECTED NOT DETECTED  Final   Parainfluenza Virus 4 NOT DETECTED NOT DETECTED Final   Respiratory Syncytial Virus NOT DETECTED NOT DETECTED Final   Bordetella pertussis NOT DETECTED NOT DETECTED Final   Bordetella Parapertussis NOT DETECTED NOT DETECTED Final   Chlamydophila pneumoniae NOT DETECTED NOT DETECTED Final   Mycoplasma pneumoniae NOT DETECTED NOT DETECTED Final    Comment: Performed at [facility], [place] [address], [place], [place] [address]         Radiology Studies: No results found.      Scheduled Meds:  azithromycin  500 mg Oral Daily   carvedilol  12.5 mg Oral BID WC   Chlorhexidine Gluconate Cloth  6 each Topical Daily   dexamethasone  4 mg Oral Q12H   docusate sodium  100 mg Oral BID   feeding supplement  237 mL Oral TID BM   fentaNYL  1 patch Transdermal Q72H   heparin  5,000 Units Subcutaneous Q8H   hydrALAZINE  50 mg Oral Q8H   insulin aspart  0-5 Units Subcutaneous QHS   insulin aspart  0-9 Units Subcutaneous TID WC   insulin aspart  2 Units Subcutaneous TID WC   insulin glargine-yfgn  9 Units Subcutaneous QHS   levothyroxine  25 mcg Oral Q MTWThF   lidocaine  1 patch Transdermal QHS   multivitamin with minerals  1 tablet Oral Daily   oxybutynin  5 mg Oral  Daily   senna-docusate  1 tablet Oral QHS   sodium zirconium cyclosilicate  10 g Oral BID   torsemide  20 mg Oral Daily   Continuous Infusions:  sodium chloride       LOS: 20 days   Time spent= 35 mins    Sharona Rovner Arsenio Loader, MD [facility]  If 7PM-7AM, please contact night-coverage  11/14/2022, 7:47 AM

## 2022-11-15 ENCOUNTER — Inpatient Hospital Stay (HOSPITAL_COMMUNITY): Payer: Medicare Other

## 2022-11-15 ENCOUNTER — Ambulatory Visit
Admit: 2022-11-15 | Discharge: 2022-11-15 | Disposition: A | Discharge status: Home / Self Care | Payer: Medicare Other | Attending: Radiation Oncology | Admitting: Radiation Oncology

## 2022-11-15 ENCOUNTER — Other Ambulatory Visit: Payer: Self-pay

## 2022-11-15 DIAGNOSIS — R52 Pain, unspecified: Secondary | ICD-10-CM | POA: Diagnosis not present

## 2022-11-15 DIAGNOSIS — C7951 Secondary malignant neoplasm of bone: Secondary | ICD-10-CM | POA: Diagnosis not present

## 2022-11-15 DIAGNOSIS — Z0189 Encounter for other specified special examinations: Secondary | ICD-10-CM

## 2022-11-15 DIAGNOSIS — Z510 Encounter for antineoplastic radiation therapy: Secondary | ICD-10-CM | POA: Diagnosis not present

## 2022-11-15 DIAGNOSIS — C220 Liver cell carcinoma: Secondary | ICD-10-CM | POA: Diagnosis not present

## 2022-11-15 LAB — CBC
HCT: 33.9 % — ABNORMAL LOW (ref 36.0–46.0)
Hemoglobin: 10.3 g/dL — ABNORMAL LOW (ref 12.0–15.0)
MCH: 29 pg (ref 26.0–34.0)
MCHC: 30.4 g/dL (ref 30.0–36.0)
MCV: 95.5 fL (ref 80.0–100.0)
Platelets: 118 10*3/uL — ABNORMAL LOW (ref 150–400)
RBC: 3.55 MIL/uL — ABNORMAL LOW (ref 3.87–5.11)
RDW: 18.5 % — ABNORMAL HIGH (ref 11.5–15.5)
WBC: 5.5 10*3/uL (ref 4.0–10.5)
nRBC: 0.4 % — ABNORMAL HIGH (ref 0.0–0.2)

## 2022-11-15 LAB — ECHOCARDIOGRAM COMPLETE
Area-P 1/2: 4.04 cm2
Height: 63 in
S' Lateral: 2.7 cm
Weight: 3396.85 oz

## 2022-11-15 LAB — BASIC METABOLIC PANEL
Anion gap: 5 (ref 5–15)
BUN: 71 mg/dL — ABNORMAL HIGH (ref 8–23)
CO2: 27 mmol/L (ref 22–32)
Calcium: 7.4 mg/dL — ABNORMAL LOW (ref 8.9–10.3)
Chloride: 108 mmol/L (ref 98–111)
Creatinine, Ser: 1.41 mg/dL — ABNORMAL HIGH (ref 0.44–1.00)
GFR, Estimated: 37 mL/min — ABNORMAL LOW (ref >60)
Glucose, Bld: 170 mg/dL — ABNORMAL HIGH (ref 70–99)
Potassium: 5.2 mmol/L — ABNORMAL HIGH (ref 3.5–5.1)
Sodium: 140 mmol/L (ref 135–145)

## 2022-11-15 LAB — RAD ONC ARIA SESSION SUMMARY
Course Elapsed Days: 5
Plan Fractions Treated to Date: 4
Plan Prescribed Dose Per Fraction: 3 Gy
Plan Total Fractions Prescribed: 10
Plan Total Prescribed Dose: 30 Gy
Reference Point Dosage Given to Date: 12 Gy
Reference Point Session Dosage Given: 3 Gy
Session Number: 4

## 2022-11-15 LAB — GLUCOSE, CAPILLARY
Glucose-Capillary: 158 mg/dL — ABNORMAL HIGH (ref 70–99)
Glucose-Capillary: 103 mg/dL — ABNORMAL HIGH (ref 70–99)
Glucose-Capillary: 243 mg/dL — ABNORMAL HIGH (ref 70–99)
Glucose-Capillary: 157 mg/dL — ABNORMAL HIGH (ref 70–99)

## 2022-11-15 LAB — CALCIUM, IONIZED: Calcium, Ionized, Serum: 3.9 mg/dL — ABNORMAL LOW (ref 4.5–5.6)

## 2022-11-15 LAB — MAGNESIUM: Magnesium: 2.8 mg/dL — ABNORMAL HIGH (ref 1.7–2.4)

## 2022-11-15 MED ORDER — FUROSEMIDE 10 MG/ML IJ SOLN
60.0000 mg | Freq: Once | INTRAMUSCULAR | Status: AC
  Administered 2022-11-15: 60 mg via INTRAVENOUS
  Filled 2022-11-15: qty 6

## 2022-11-15 MED ORDER — ALBUMIN HUMAN 25 % IV SOLN
50.0000 g | Freq: Once | INTRAVENOUS | Status: AC
  Administered 2022-11-15: 50 g via INTRAVENOUS
  Filled 2022-11-15: qty 200

## 2022-11-15 MED ORDER — IPRATROPIUM-ALBUTEROL 0.5-2.5 (3) MG/3ML IN SOLN
3.0000 mL | Freq: Two times a day (BID) | RESPIRATORY_TRACT | Status: DC
  Administered 2022-11-15 – 2022-11-16 (×3): 3 mL via RESPIRATORY_TRACT
  Filled 2022-11-15 (×3): qty 3

## 2022-11-15 MED ORDER — ALBUTEROL SULFATE (2.5 MG/3ML) 0.083% IN NEBU
2.5000 mg | INHALATION_SOLUTION | RESPIRATORY_TRACT | Status: DC | PRN
  Administered 2022-11-22: 2.5 mg via RESPIRATORY_TRACT
  Filled 2022-11-15 (×2): qty 3

## 2022-11-15 MED ORDER — NYSTATIN 100000 UNIT/ML MT SUSP
5.0000 mL | Freq: Four times a day (QID) | OROMUCOSAL | Status: DC
  Administered 2022-11-15 – 2022-11-17 (×10): 500000 [IU] via ORAL
  Filled 2022-11-15 (×10): qty 5

## 2022-11-15 MED ORDER — SODIUM ZIRCONIUM CYCLOSILICATE 10 G PO PACK
10.0000 g | PACK | Freq: Two times a day (BID) | ORAL | Status: DC
  Administered 2022-11-15 (×2): 10 g via ORAL
  Filled 2022-11-15 (×3): qty 1

## 2022-11-15 NOTE — Hospital Course (Addendum)
87 year old with history of DM2, HTN, HLD, diastolic CHF, pulmonary HTN, cirrhosis, CKD stage IIIa, renal cell carcinoma with left nephrectomy in 2000, hypothyroidism presented to hospital with intractable back pain currently admitted with working diagnosis of metastatic hepatic cellular carcinoma, respiratory failure.  Her CT scan showed compression fracture of L4 with innumerable bilateral pulmonary metastases, posterior left acetabular wall fracture.  MRI confirmed these findings and patient was admitted to the hospital.  Radiology and oncology were consulted.  IR procedure for kyphoplasty or biopsy was postponed as patient was unable to lay flat.  Patient was transferred to [facility] for radiation treatments.  Since being here patient is getting pain control, Chemo-Port placed on 12/8. Hospital course complicated by hyperkalemia.  Due to some respiratory distress, CT chest was done which showed signs of volume overload, some reduction in pulmonary nodules.  Her respiratory status,  hydration and renal function has been very tenuous therefore off-and-on requiring IV fluids and diuretics.

## 2022-11-15 NOTE — TOC Progression Note (Signed)
Transition of Care [facility]) - Progression Note    Patient Details  Name: Margaret Dyer MRN: [id number] Date of Birth: [birth year]  Transition of Care [facility] CM/SW [place], RN Phone Number:[phone]  11/15/2022, 8:13 AM  Clinical Narrative:    TOC continues to follow for disposition planning. Current barrier -patient receiving daily radiation and MD documents that patient  will be able to have radiation therapy as an outpatient but MD thinks that patient  too much going on and is too debilitated to go back and forth to radiation.   Expected Discharge Plan: [place] Barriers to Discharge: Continued Medical Work up, Ship broker, SNF Pending bed offer  Expected Discharge Plan and Services Expected Discharge Plan: [place] arrangements for the past 2 months: Single Family Home                                       Social Determinants of Health (SDOH) Interventions Transportation Interventions: [id number] Referral, Inpatient TOC  Readmission Risk Interventions     No data to display

## 2022-11-15 NOTE — Progress Notes (Signed)
PROGRESS NOTE Margaret Dyer  TDD:[id number] DOB: 1935/04/06 DOA: 10/31/2022 PCP: Ann Held, DO   Brief Narrative/Hospital Course: 87 year old with history of DM2, HTN, HLD, diastolic CHF, pulmonary HTN, cirrhosis, CKD stage IIIa, renal cell carcinoma with left nephrectomy in 2000, hypothyroidism presented to hospital with intractable back pain currently admitted with working diagnosis of metastatic hepatic cellular carcinoma, respiratory failure.  Her CT scan showed compression fracture of L4 with innumerable bilateral pulmonary metastases, posterior left acetabular wall fracture.  MRI confirmed these findings and patient was admitted to the hospital.  Radiology and oncology were consulted.  IR procedure for kyphoplasty or biopsy was postponed as patient was unable to lay flat.  Patient was transferred to [facility] for radiation treatments.  Since being here patient is getting pain control, Chemo-Port placed on 12/8. Hospital course complicated by hyperkalemia.  Due to some respiratory distress, CT chest was done which showed signs of volume overload, some reduction in pulmonary nodules.  Her respiratory status,  hydration and renal function has been very tenuous therefore off-and-on requiring IV fluids and diuretics.         Subjective: Seen and examined this morning.  She just back from her radiation treatment On 4 L nasal cannula reports she is on 6 at home. Denies any new complaint. Pain is controlled Afebrile overnight BP 120s to 130s Labs reviewed creatinine stable 1.4 potassium ABT 5.2, stable thrombocytopenia and normocytic anemia stable.'  Echocardiogram pending, onco started iv albumin and Lasix to mobilize her fluids and help her respiratory status this morning.  Assessment and Plan:  Compression fracture L4 with concern for pathological fracture and metastasis Intractable back pain from above: Had extensive imaging with MRI, CT of the pelvis.  Has 30% vertebral  height loss L4.  Kyphoplasty and biopsy on hold as patient unable to tolerate due to pain and discomfort, oncology following status post Chemo-Port placement, xrt started 12/8 today  #4/10.  Continue tramadol/Decadron, fentanyl patch, lidocaine patch and Tylenol for pain control.  PT OT.  Innumerable b/l pulmonary metastasis History of renal cell CA with left nephrectomy CT scan obtained on admission showed innumerable bilateral pulmonary metastases as well as possible liver mets and bone mets.  Oncology suspecting metastatic hepatocellular carcinoma due to elevated AFP.  See above continue XRT. 1/27, s/p liver biopsy -showed cirrhosis but no malignant disease  Anasarca Leg edema: CT chest shows bilateral pleural effusion, ascites, and some reduction in pulmonary nodules.  Labs with elevated BNP Procalcitonin 0.5 continue Demadex bronchodilators and monitor .  Echo done today with EF 60 to 65%, G3 DD restrictive, moderately elevated PASP.  Getting IV Lasix and 11 per oncology. Net IO Since Admission: 6,574.21 mL [11/15/22 1125]  Filed Weights   11/13/22 0500 11/14/22 0537 11/15/22 0635  Weight: 99.1 kg 103.6 kg 96.3 kg   Hyperkalemia: Still elevated continue Lokelma and repeat in the morning Hypocalcemia replaced and stable CKD stage IV, history of left nephrectomy: Baseline creatinine close to 1.7 currently stable.  Monitor.  Continue Demadex.   Recent Labs    11/06/22 0408 11/07/22 0419 11/08/22 0604 11/09/22 0639 11/10/22 0621 11/11/22 0729 11/12/22 0500 11/13/22 0523 11/14/22 0500 11/15/22 0245  BUN 39* 42* 57* 70* 76* 87* 90* 72* 84* 71*  CREATININE 1.92* 1.95* 2.06* 2.09* 1.90* 2.02* 1.80* 1.65* 1.44* 1.41*   Pneumonia: completed meropenem 12/1-12/5  Posterior left acetabular wall fracture of indeterminate age: Noted on the CT of the hip with a small fracture 11/22, seen by  [facility], recommended PWB with assistance of a walker, Follow in office as outpt 3 weeks for  repeat xrays   Klebsiella UTI: Completed antibiotics  T2DM with intermittent hyperglycemia due to steroids: Continue SSI Semglee 9 units bedtime and 2 units Premeal blood sugar stable Recent Labs  Lab 11/14/22 1116 11/14/22 1653 11/14/22 2052 11/15/22 0747 11/15/22 1121  GLUCAP 208* 254* 175* 158* 103*   Hypertension BP stable on Coreg hydralazine Demadex  COPD /Chronic hypoxic respiratory failure: On home O2 3.5liters at home on 3.5 L currently stable on 4 Continue bronchodilators open oxygen and monitor  HLD on Zetia and Zocor Hypothyroidism continue Synthroid Continue the same.    Thrombocytopenia, chronic Does has ecchymosis ,but no overt bleeding Management per hematology oncology Dr. Marin Olp  Failure to thrive in adult continue in the setting of cancer continue palliative care, dietitian input supportive care   Class II Obesity:Patient's Body mass index is 37.61 kg/m. : Will benefit with PCP follow-up, weight loss  healthy lifestyle and outpatient sleep evaluation.   DVT prophylaxis: heparin injection 5,000 Units Start: 10/25/22 1400 SCDs Start: 10/25/22 0220 Code Status:   Code Status: Partial Code Family Communication: plan of care discussed with patient at bedside. Patient status is: Inpatient because of ongoing need for radiation therapy inpatient Level of care: Telemetry   Dispo: The patient is from: Lives at daughter's home            Anticipated disposition: SNF Objective: Vitals last 24 hrs: Vitals:   11/14/22 2053 11/15/22 0200 11/15/22 0635 11/15/22 0735  BP: (!) 128/44  (!) 134/46   Pulse: (!) 57  62   Resp: 17  17   Temp: 98.2 F (36.8 C)  97.8 F (36.6 C)   TempSrc: Oral  Oral   SpO2: 96% 98% 95% 97%  Weight:   96.3 kg   Height:       Weight change: -7.3 kg  Physical Examination: General exam: alert awake, older than stated age HEENT:Oral mucosa moist, Ear/Nose WNL grossly Respiratory system: bilaterally diminished BS,no use of accessory  muscle Cardiovascular system: S1 & S2 +, No JVD. Gastrointestinal system: Abdomen soft,NT,ND, BS+ Nervous System:Alert, awake, moving extremities. Extremities: LE edema +,distal peripheral pulses palpable.  Skin: No rashes,no icterus. MSK: Normal muscle bulk,tone, power  Medications reviewed:  Scheduled Meds:  azithromycin  500 mg Oral Daily   carvedilol  12.5 mg Oral BID WC   Chlorhexidine Gluconate Cloth  6 each Topical Daily   dexamethasone  4 mg Oral Q12H   docusate sodium  100 mg Oral BID   feeding supplement  237 mL Oral TID BM   fentaNYL  1 patch Transdermal Q72H   furosemide  60 mg Intravenous Once   heparin  5,000 Units Subcutaneous Q8H   hydrALAZINE  50 mg Oral Q8H   insulin aspart  0-5 Units Subcutaneous QHS   insulin aspart  0-9 Units Subcutaneous TID WC   insulin aspart  2 Units Subcutaneous TID WC   insulin glargine-yfgn  9 Units Subcutaneous QHS   ipratropium-albuterol  3 mL Nebulization BID   levothyroxine  25 mcg Oral Q MTWThF   lidocaine  1 patch Transdermal QHS   multivitamin with minerals  1 tablet Oral Daily   oxybutynin  5 mg Oral Daily   senna-docusate  1 tablet Oral QHS   torsemide  20 mg Oral Daily   Continuous Infusions:  sodium chloride        Diet Order  Diet regular Room service appropriate? Yes; Fluid consistency: Thin  Diet effective now                    Nutrition Problem: Increased nutrient needs Etiology: acute illness, chronic illness Signs/Symptoms: estimated needs Interventions: Ensure Enlive (each supplement provides 350kcal and 20 grams of protein), Refer to RD note for recommendations   Intake/Output Summary (Last 24 hours) at 11/15/2022 1110 Last data filed at 11/15/2022 0852 Gross per 24 hour  Intake 720 ml  Output 300 ml  Net 420 ml   Net IO Since Admission: 6,574.21 mL [11/15/22 1110]  Wt Readings from Last 3 Encounters:  11/15/22 96.3 kg  10/20/22 82.9 kg  09/04/22 85.8 kg     Unresulted Labs  (From admission, onward)     Start     Ordered   11/16/22 1470  Basic metabolic panel  Daily,   R     Question:  Specimen collection method  Answer:  Lab=Lab collect   11/15/22 0756   11/16/22 0500  CBC  Daily,   R     Question:  Specimen collection method  Answer:  Lab=Lab collect   11/15/22 0756   11/14/22 0735  Calcium, ionized  Once,   R        11/14/22 0735          Data Reviewed: I have personally reviewed following labs and imaging studies CBC: Recent Labs  Lab 11/11/22 0729 11/12/22 0500 11/13/22 0523 11/14/22 0500 11/15/22 0245  WBC 4.5 4.1 5.3 5.4 5.5  HGB 10.9* 10.5* 10.2* 10.7* 10.3*  HCT 36.2 34.6* 33.3* 34.6* 33.9*  MCV 95.0 96.1 94.9 95.1 95.5  PLT 118* 118* 124* 115* 929*   Basic Metabolic Panel: Recent Labs  Lab 11/11/22 0729 11/12/22 0500 11/13/22 0523 11/14/22 0500 11/15/22 0245  NA 140 140 139 139 140  K 5.6* 5.8* 5.7* 5.3* 5.2*  CL 109 110 109 107 108  CO2 _0 GLUCOSE 152* 164* 163* 194* 170*  BUN 87* 90* 72* 84* 71*  CREATININE 2.02* 1.80* 1.65* 1.44* 1.41*  CALCIUM 6.9* 6.6* 6.6* 7.0* 7.4*  MG 3.4* 3.5* 3.6* 3.3* 2.8*   GFR: Estimated Creatinine Clearance: 32.2 mL/min (A) (by C-G formula based on SCr of 1.41 mg/dL (H)). Liver Function Tests: Recent Labs  Lab 11/09/22 0639 11/10/22 0621 11/11/22 0729  AST 33 31 34  ALT _1 ALKPHOS 106 88 88  BILITOT 0.6 0.6 0.8  PROT 6.1* 5.4* 6.0*  ALBUMIN 2.9* 2.6* 2.9*   Recent Results (from the past 240 hour(s))  Respiratory (~20 pathogens) panel by PCR     Status: None   Collection Time: 11/06/22  9:14 AM   Specimen: Nasopharyngeal Swab; Respiratory  Result Value Ref Range Status   Adenovirus NOT DETECTED NOT DETECTED Final   Coronavirus 229E NOT DETECTED NOT DETECTED Final    Comment: (NOTE) The Coronavirus on the Respiratory Panel, DOES NOT test for the novel  Coronavirus (2019 nCoV)    Coronavirus HKU1 NOT DETECTED NOT DETECTED Final   Coronavirus NL63 NOT  DETECTED NOT DETECTED Final   Coronavirus OC43 NOT DETECTED NOT DETECTED Final   Metapneumovirus NOT DETECTED NOT DETECTED Final   Rhinovirus / Enterovirus NOT DETECTED NOT DETECTED Final   Influenza A NOT DETECTED NOT DETECTED Final   Influenza B NOT DETECTED NOT DETECTED Final   Parainfluenza Virus 1 NOT DETECTED NOT DETECTED Final   Parainfluenza Virus 2 NOT  DETECTED NOT DETECTED Final   Parainfluenza Virus 3 NOT DETECTED NOT DETECTED Final   Parainfluenza Virus 4 NOT DETECTED NOT DETECTED Final   Respiratory Syncytial Virus NOT DETECTED NOT DETECTED Final   Bordetella pertussis NOT DETECTED NOT DETECTED Final   Bordetella Parapertussis NOT DETECTED NOT DETECTED Final   Chlamydophila pneumoniae NOT DETECTED NOT DETECTED Final   Mycoplasma pneumoniae NOT DETECTED NOT DETECTED Final    Comment: Performed at [facility], [place] [address], [place], [place] [address]    Antimicrobials: Anti-infectives (From admission, onward)    Start     Dose/Rate Route Frequency Ordered Stop   11/13/22 1000  azithromycin (ZITHROMAX) tablet 500 mg        500 mg Oral Daily 11/13/22 0732     11/11/22 0800  azithromycin (ZITHROMAX) 500 mg in sodium chloride 0.9 % 250 mL IVPB  Status:  Discontinued        500 mg 250 mL/hr over 60 Minutes Intravenous Every 24 hours 11/11/22 0717 11/13/22 0732   11/23/2022 0800  meropenem (MERREM) 500 mg in sodium chloride 0.9 % 100 mL IVPB        500 mg 200 mL/hr over 30 Minutes Intravenous 2 times daily 11/28/2022 0637 11/07/22 2316   11/02/22 0800  vancomycin (VANCOCIN) IVPB 1000 mg/200 mL premix        1,000 mg 200 mL/hr over 60 Minutes Intravenous To Radiology 11/02/22 0710 11/20/2022 0800   10/25/22 0715  cefTRIAXone (ROCEPHIN) 1 g in sodium chloride 0.9 % 100 mL IVPB  Status:  Discontinued        1 g 200 mL/hr over 30 Minutes Intravenous Every 24 hours 10/25/22 0701 10/27/22 1515      Culture/Microbiology    Component Value Date/Time   SDES URINE, CLEAN CATCH  10/28/2022 0208   SPECREQUEST NONE 10/28/2022 0208   CULT  10/28/2022 0208    NO GROWTH Performed at [facility], [place] [address], [place], [place] [address]    REPTSTATUS 10/29/2022 FINAL 10/28/2022 5784   Radiology Studies: ECHOCARDIOGRAM COMPLETE  Result Date: 11/15/2022    ECHOCARDIOGRAM REPORT   Patient Name:   ISELA STANTZ Date of Exam: 11/15/2022 Medical Rec #:  [id number]     Height:       63.0 in Accession #:    [id number]    Weight:       212.3 lb Date of Birth:  21-Apr-1935     BSA:          1.983 m Patient Age:    40 years      BP:           134/46 mmHg Patient Gender: F             HR:           60 bpm. Exam Location:  Inpatient Procedure: 2D Echo, Color Doppler and Cardiac Doppler Indications:    Chemo  History:        Patient has prior history of Echocardiogram examinations, most                 recent 05/26/2021. CHF; Risk Factors:Hypertension, Diabetes,                 Dyslipidemia, Sleep Apnea and Lung mets.  Sonographer:    Raquel Sarna Senior RDCS Referring Phys: Berino Comments: Poor apical window due to body habitus/lung interference, wall motion obtained from short axis. IMPRESSIONS  1.  Left ventricular ejection fraction, by estimation, is 60 to 65%. The left ventricle has normal function. The left ventricle has no regional wall motion abnormalities. Left ventricular diastolic parameters are consistent with Grade III diastolic dysfunction (restrictive). Elevated left ventricular end-diastolic pressure. The E/e' is 33.5.  2. Right ventricular systolic function is normal. The right ventricular size is normal. There is moderately elevated pulmonary artery systolic pressure. The estimated right ventricular systolic pressure is 86.7 mmHg.  3. Left atrial size was mild to moderately dilated.  4. A small pericardial effusion is present. The pericardial effusion is circumferential.  5. The mitral valve is grossly normal. Mild mitral valve regurgitation. No evidence  of mitral stenosis.  6. The aortic valve is tricuspid. Aortic valve regurgitation is not visualized. No aortic stenosis is present.  7. The inferior vena cava is dilated in size with <50% respiratory variability, suggesting right atrial pressure of 15 mmHg. Comparison(s): Changes from prior study are noted. EF unchanged. Grade 3 DD. Elevated pulmonary pressures. FINDINGS  Left Ventricle: Left ventricular ejection fraction, by estimation, is 60 to 65%. The left ventricle has normal function. The left ventricle has no regional wall motion abnormalities. The left ventricular internal cavity size was normal in size. There is  no left ventricular hypertrophy. Left ventricular diastolic parameters are consistent with Grade III diastolic dysfunction (restrictive). Elevated left ventricular end-diastolic pressure. The E/e' is 33.5. Right Ventricle: The right ventricular size is normal. No increase in right ventricular wall thickness. Right ventricular systolic function is normal. There is moderately elevated pulmonary artery systolic pressure. The tricuspid regurgitant velocity is 2.74 m/s, and with an assumed right atrial pressure of 15 mmHg, the estimated right ventricular systolic pressure is 67.2 mmHg. Left Atrium: Left atrial size was mild to moderately dilated. Right Atrium: Right atrial size was normal in size. Pericardium: A small pericardial effusion is present. The pericardial effusion is circumferential. Presence of epicardial fat layer. Mitral Valve: The mitral valve is grossly normal. Mild mitral valve regurgitation. No evidence of mitral valve stenosis. Tricuspid Valve: The tricuspid valve is grossly normal. Tricuspid valve regurgitation is mild . No evidence of tricuspid stenosis. Aortic Valve: The aortic valve is tricuspid. Aortic valve regurgitation is not visualized. No aortic stenosis is present. Pulmonic Valve: The pulmonic valve was grossly normal. Pulmonic valve regurgitation is mild. No evidence of  pulmonic stenosis. Aorta: The aortic root and ascending aorta are structurally normal, with no evidence of dilitation. Venous: The inferior vena cava is dilated in size with less than 50% respiratory variability, suggesting right atrial pressure of 15 mmHg. IAS/Shunts: The atrial septum is grossly normal.  LEFT VENTRICLE PLAX 2D LVIDd:         4.90 cm   Diastology LVIDs:         2.70 cm   LV e' medial:    3.70 cm/s LV PW:         1.20 cm   LV E/e' medial:  33.5 LV IVS:        1.00 cm   LV e' lateral:   5.11 cm/s LVOT diam:     1.90 cm   LV E/e' lateral: 24.3 LV SV:         75 LV SV Index:   38 LVOT Area:     2.84 cm  RIGHT VENTRICLE RV S prime:     13.30 cm/s TAPSE (M-mode): 2.1 cm LEFT ATRIUM              Index  RIGHT ATRIUM           Index LA diam:        3.90 cm  1.97 cm/m   RA Area:     18.10 cm LA Vol (A2C):   102.0 ml 51.43 ml/m  RA Volume:   45.00 ml  22.69 ml/m LA Vol (A4C):   74.4 ml  37.51 ml/m LA Biplane Vol: 91.1 ml  45.93 ml/m  AORTIC VALVE LVOT Vmax:   112.00 cm/s LVOT Vmean:  77.100 cm/s LVOT VTI:    0.265 m  AORTA Ao Root diam: 3.30 cm Ao Asc diam:  3.50 cm MITRAL VALVE                TRICUSPID VALVE MV Area (PHT): 4.04 cm     TR Peak grad:   30.0 mmHg MV Decel Time: 188 msec     TR Vmax:        274.00 cm/s MV E velocity: 124.00 cm/s MV A velocity: 56.70 cm/s   SHUNTS MV E/A ratio:  2.19         Systemic VTI:  0.26 m                             Systemic Diam: 1.90 cm Eleonore Chiquito MD Electronically signed by Eleonore Chiquito MD Signature Date/Time: 11/15/2022/9:51:01 AM    Final    DG Chest 2 View  Result Date: 11/14/2022 CLINICAL DATA:  Pneumonia EXAM: CHEST - 2 VIEW COMPARISON:  11/10/2022 FINDINGS: Frontal and lateral views of the chest demonstrate stable right chest wall port. Stable bilateral pulmonary nodules are seen, consistent with known pulmonary metastases. There is progressive multifocal bilateral airspace disease, greatest at the left lung base. Bilateral pleural  effusions. No pneumothorax. Stable cardiac silhouette. No acute bony abnormalities. IMPRESSION: 1. Progressive multifocal bilateral airspace disease with bilateral pleural effusions. Findings could reflect worsening edema or infection. 2. Numerous pulmonary nodules, better seen on recent CT, consistent with known metastatic renal cell carcinoma. Electronically Signed   By: Randa Ngo M.D.   On: 11/14/2022 15:13     LOS: 21 days   Antonieta Pert, MD [facility]  11/15/2022, 11:10 AM

## 2022-11-15 NOTE — Progress Notes (Signed)
Margaret Dyer is about the same.  She is having no problems with the radiation therapy.  She did have a repeat chest x-ray yesterday.  Unfortunately this showed that the lung changes might be a little bit worse.  This could be pulmonary edema.  We are getting an echocardiogram on her today.  She still is not all that active.  She really needs a lot of physical therapy to try to get moving.  I know that a lot of people have been seeing her.  Nutrition has seen her.  Physical therapy is seeing her.  Palliative care has seen her.  Again, I am not sure that she will be a candidate for any systemic therapy if we cannot get her performance status to be improved.  She has not complained of any pain.  Then again, she really does not all that active.  Her labs show a potassium 5.2.  BUN is 71 creatinine 1.41.  Calcium 7.4 with a magnesium of 2.8.  Her white cell count is 5.5.  Hemoglobin 10.3.  Platelet count 118,000.  She still has little bit of a cough.  Is nonproductive.  She has some thrush in her oral cavity.  I will see about getting her on some Diflucan.  Again, I doubt that she will ever have a kyphoplasty as she cannot lie down on her stomach.  I also think that she will be able to have radiation therapy as an outpatient.  I do think that she has too much going on and is just too debilitated to go back and forth to radiation.  Her vital signs show temperature of 97.8.  Pulse 62.  Blood pressure 134/46.  Of note, when she was admitted to the hospital her weight was 182 pounds.  Her lungs sound relatively clear bilaterally.  Cardiac exam is regular rate and rhythm.  I do not hear a murmur.  Abdomen is soft.  She is somewhat obese.  She has slightly decreased bowel sounds.  Extremity shows some mild edema in her arms and legs.  She has a lot of ecchymoses on her arms.  Neurological exam is nonfocal.  Margaret Dyer has a metastatic hepatocellular carcinoma.  She is getting radiation to the back.  She  is having the pulmonary issues.  Again, I would not think that this is lymphangitic spread of tumor.  That would be a little bit unusual for hepatocellular carcinoma.  It will be interesting to see what the echocardiogram shows.  Maybe, we can give her some albumin today and then possibly some Lasix afterwards to see if this may draw some fluid out of her lungs.  I do appreciate everybody's help.  I know this is an credibly complicated.  [name], MD  [name] 33:6

## 2022-11-15 NOTE — Progress Notes (Signed)
Echocardiogram 2D Echocardiogram has been performed.  Oneal Deputy Maricia Scotti RDCS 11/15/2022, 8:18 AM

## 2022-11-15 NOTE — Progress Notes (Signed)
Dr Lupita Leash,  The patient's two daughters have a multitude of questions to ask you about her care and have a couple of requests regarding her scheduled medication. I will include them below:  1) "Would a paracentesis help her since her belly is so swollen? Her normal weight is less than 182 lbs, Her bed scale shows 204 as of 11/15/22. ALL FLUID RETENTION" 2) "Since her kidney function is down, would peritoneal dialysis help remove excess fluid?" 3) "Did they start Immunotherapy? If so, what is the name of the Rx?" 4) "Could the immunotherapy, if this is what she got, be the cause of why she was worse today?"  Please call [name], daughter, to relay your responses to their questions She can be reached at [phone].  Also the daughter was wondering if you can schedule her Ativan for bedtime to be given routinely. Last night the patient was able to request the medication but the daughter wants it scheduled so we automatically give it to her. She also had the same request for her breathing treatment that they could be scheduled every 4-6 hours instead of just the two a day with the every 4 hr prn treatment due to her mothers concern about her not be able to request them as needed due to her recent cognitive changes

## 2022-11-16 ENCOUNTER — Ambulatory Visit
Admit: 2022-11-16 | Discharge: 2022-11-16 | Disposition: A | Discharge status: Home / Self Care | Payer: Medicare Other | Attending: Radiation Oncology | Admitting: Radiation Oncology

## 2022-11-16 ENCOUNTER — Other Ambulatory Visit: Payer: Self-pay

## 2022-11-16 ENCOUNTER — Inpatient Hospital Stay (HOSPITAL_COMMUNITY): Payer: Medicare Other

## 2022-11-16 DIAGNOSIS — C7951 Secondary malignant neoplasm of bone: Secondary | ICD-10-CM | POA: Diagnosis not present

## 2022-11-16 DIAGNOSIS — C220 Liver cell carcinoma: Secondary | ICD-10-CM | POA: Diagnosis not present

## 2022-11-16 DIAGNOSIS — Z510 Encounter for antineoplastic radiation therapy: Secondary | ICD-10-CM | POA: Diagnosis not present

## 2022-11-16 LAB — RAD ONC ARIA SESSION SUMMARY
Course Elapsed Days: 6
Plan Fractions Treated to Date: 5
Plan Prescribed Dose Per Fraction: 3 Gy
Plan Total Fractions Prescribed: 10
Plan Total Prescribed Dose: 30 Gy
Reference Point Dosage Given to Date: 15 Gy
Reference Point Session Dosage Given: 3 Gy
Session Number: 5

## 2022-11-16 LAB — BASIC METABOLIC PANEL
Anion gap: 8 (ref 5–15)
BUN: 71 mg/dL — ABNORMAL HIGH (ref 8–23)
CO2: 28 mmol/L (ref 22–32)
Calcium: 7.5 mg/dL — ABNORMAL LOW (ref 8.9–10.3)
Chloride: 106 mmol/L (ref 98–111)
Creatinine, Ser: 1.41 mg/dL — ABNORMAL HIGH (ref 0.44–1.00)
GFR, Estimated: 37 mL/min — ABNORMAL LOW (ref >60)
Glucose, Bld: 157 mg/dL — ABNORMAL HIGH (ref 70–99)
Potassium: 4.7 mmol/L (ref 3.5–5.1)
Sodium: 142 mmol/L (ref 135–145)

## 2022-11-16 LAB — CBC
HCT: 31.9 % — ABNORMAL LOW (ref 36.0–46.0)
Hemoglobin: 9.8 g/dL — ABNORMAL LOW (ref 12.0–15.0)
MCH: 29.3 pg (ref 26.0–34.0)
MCHC: 30.7 g/dL (ref 30.0–36.0)
MCV: 95.5 fL (ref 80.0–100.0)
Platelets: 108 10*3/uL — ABNORMAL LOW (ref 150–400)
RBC: 3.34 MIL/uL — ABNORMAL LOW (ref 3.87–5.11)
RDW: 18.6 % — ABNORMAL HIGH (ref 11.5–15.5)
WBC: 5.3 10*3/uL (ref 4.0–10.5)
nRBC: 0.4 % — ABNORMAL HIGH (ref 0.0–0.2)

## 2022-11-16 LAB — GLUCOSE, CAPILLARY
Glucose-Capillary: 159 mg/dL — ABNORMAL HIGH (ref 70–99)
Glucose-Capillary: 150 mg/dL — ABNORMAL HIGH (ref 70–99)
Glucose-Capillary: 150 mg/dL — ABNORMAL HIGH (ref 70–99)
Glucose-Capillary: 311 mg/dL — ABNORMAL HIGH (ref 70–99)
Glucose-Capillary: 205 mg/dL — ABNORMAL HIGH (ref 70–99)
Glucose-Capillary: 212 mg/dL — ABNORMAL HIGH (ref 70–99)

## 2022-11-16 MED ORDER — POLYETHYLENE GLYCOL 3350 17 G PO PACK: 17.0000 g | PACK | Freq: Every day | ORAL | Status: DC | PRN

## 2022-11-16 MED ORDER — LORAZEPAM 0.5 MG PO TABS
0.5000 mg | ORAL_TABLET | Freq: Every day | ORAL | Status: DC
  Administered 2022-11-16 – 2022-11-22 (×6): 0.5 mg via ORAL
  Filled 2022-11-16 (×6): qty 1

## 2022-11-16 MED ORDER — METOLAZONE 5 MG PO TABS
5.0000 mg | ORAL_TABLET | ORAL | Status: DC
  Administered 2022-11-16 – 2022-11-20 (×5): 5 mg via ORAL
  Filled 2022-11-16 (×6): qty 1

## 2022-11-16 MED ORDER — HYDRALAZINE HCL 25 MG PO TABS: 25.0000 mg | ORAL_TABLET | Freq: Four times a day (QID) | ORAL | Status: DC | PRN

## 2022-11-16 MED ORDER — DRONABINOL 2.5 MG PO CAPS
2.5000 mg | ORAL_CAPSULE | Freq: Every day | ORAL | Status: DC
  Administered 2022-11-16 – 2022-11-22 (×7): 2.5 mg via ORAL
  Filled 2022-11-16 (×7): qty 1

## 2022-11-16 MED ORDER — FLUCONAZOLE 100 MG PO TABS
100.0000 mg | ORAL_TABLET | Freq: Every day | ORAL | Status: DC
  Administered 2022-11-16 – 2022-11-22 (×7): 100 mg via ORAL
  Filled 2022-11-16 (×7): qty 1

## 2022-11-16 MED ORDER — FUROSEMIDE 10 MG/ML IJ SOLN
40.0000 mg | Freq: Every day | INTRAMUSCULAR | Status: DC
  Administered 2022-11-16 – 2022-11-20 (×5): 40 mg via INTRAVENOUS
  Filled 2022-11-16 (×5): qty 4

## 2022-11-16 MED ORDER — ALBUTEROL SULFATE (2.5 MG/3ML) 0.083% IN NEBU
2.5000 mg | INHALATION_SOLUTION | Freq: Three times a day (TID) | RESPIRATORY_TRACT | Status: DC
  Administered 2022-11-16 – 2022-11-21 (×14): 2.5 mg via RESPIRATORY_TRACT
  Filled 2022-11-16 (×15): qty 3

## 2022-11-16 MED ORDER — SODIUM ZIRCONIUM CYCLOSILICATE 10 G PO PACK
10.0000 g | PACK | Freq: Every day | ORAL | Status: DC
  Administered 2022-11-16: 10 g via ORAL
  Filled 2022-11-16 (×2): qty 1

## 2022-11-16 NOTE — Progress Notes (Signed)
PROGRESS NOTE Margaret Dyer  SAY:[id number] DOB: Apr 15, 1935 DOA: 10/06/2022 PCP: Ann Held, DO   Brief Narrative/Hospital Course: 86 year old with history of DM2, HTN, HLD, diastolic CHF, pulmonary HTN, cirrhosis, CKD stage IIIa, renal cell carcinoma with left nephrectomy in 2000, hypothyroidism presented to hospital with intractable back pain currently admitted with working diagnosis of metastatic hepatic cellular carcinoma, respiratory failure.  Her CT scan showed compression fracture of L4 with innumerable bilateral pulmonary metastases, posterior left acetabular wall fracture.  MRI confirmed these findings and patient was admitted to the hospital.  Radiology and oncology were consulted.  IR procedure for kyphoplasty or biopsy was postponed as patient was unable to lay flat.  Patient was transferred to [facility] for radiation treatments.  Since being here patient is getting pain control, Chemo-Port placed on 12/8. Hospital course complicated by hyperkalemia.  Due to some respiratory distress, CT chest was done which showed signs of volume overload, some reduction in pulmonary nodules.  Her respiratory status,  hydration and renal function has been very tenuous therefore off-and-on requiring IV fluids and diuretics.   Subjective: Seen and examined this morning.  Patient reports he feels about the same pain wise, complains of shortness of breath which is about the same. Legs mildly swollen not worse Past 24 hours vital stable, was afebrile, BP stable, on 3.5 lL [place] Labs reviewed this morning stable creatinine 1.4 hemoglobin 9.8 platelet 108 Last night daughter requesting scheduled bronchodilator/Ativan and assess for ascites  Assessment and Plan:  Compression fracture L4 with concern for pathological fracture and metastasis Intractable back pain from above: Had extensive imaging with MRI, CT of the pelvis.Has 30% vertebral height loss L4.Kyphoplasty and biopsy on hold as patient  unable to tolerate due to pain and discomfort. Oncology following s/p chemo-Port placement, xrt started 12/8 through 12/27 for total 10 treatment. Continue pain control with tramadol, fentanyl patch, lidocaine patch tylenol  and Decadron per oncology. Cont PTOT.  Innumerable b/l pulmonary metastasis History of renal cell CA with left nephrectomy CT scan obtained on admission showed innumerable bilateral pulmonary metastases as well as possible liver mets and bone mets.  Oncology suspecting metastatic hepatocellular carcinoma due to elevated AFP.  See above continue XRT. 1/27, s/p liver biopsy -showed cirrhosis but no malignant disease  Acute on chronic hypoxic respiratory failure Shortness of breath ongoing: Patient's shortness of breath and respiratory multifactor with CHF COPD pneumonia anasarca deconditioning debility.  Continue to optimize volume status, respiratory status continue supplemental oxygen.  Grade 3 Diastolic dysfunction suspect acute on chronic disease/CHF: With ongoing shortness of breath leg edema will start IV Lasix hold Demadex for now.  Monitor intake output Daily weight as below Anasarca with leg edema/bilateral pleural effusion Cirrhosis> liver biopsy showed Cirrhosis (stage 4 of 4) of uncertain etiology, mild active steatohepatitis:  CT chest 12/9: Moderate bilateral pleural effusion, pulmonary nodules, small volume ascites, cirrhosis, cardiomegaly,  BNP was 879,Procalcitonin 0.5.Echo  12/13>EF 60 to 65%, G3 DD restrictive, moderately elevated PASP.  S/p IV Lasix and albumin 12/13.  Intake output not recorded, ordered strict intake output, monitor weight. Checked [place]? Showing only small volume ascites. Will change her Demedex to iv lasix. Her weight was 189.1 lb on 11/18/2022> now at 228>212 lb> pending today Net IO Since Admission: 6,174.21 mL [11/16/22 1235]  Filed Weights   11/13/22 0500 11/14/22 0537 11/15/22 0635  Weight: 99.1 kg 103.6 kg 96.3 kg   COPD: on home O2 3.5  l> here needing 4l [place]. Today on 3.5  l.  Continue bronchodilators scheduled and as needed, continue diuretics as above.  Continue supplemental oxygen.  Hyperkalemia: K improving change Lokelma to daily.  Monitor  Recent Labs  Lab 11/12/22 0500 11/13/22 0523 11/14/22 0500 11/15/22 0245 11/16/22 0530  K 5.8* 5.7* 5.3* 5.2* 4.7    Hypocalcemia replaced and stable.  Monitor.  CKD stage IV, history of left nephrectomy: Baseline creatinine close to 1.7 currently improved to 1.4, tolerating diuretics.  Monitor  Recent Labs    11/07/22 0419 11/08/22 0604 11/09/22 0639 11/10/22 0621 11/11/22 0729 11/12/22 0500 11/13/22 0523 11/14/22 0500 11/15/22 0245 11/16/22 0530  BUN 42* 57* 70* 76* 87* 90* 72* 84* 71* 71*  CREATININE 1.95* 2.06* 2.09* 1.90* 2.02* 1.80* 1.65* 1.44* 1.41* 1.41*   Pneumonia: completed meropenem 12/1-12/5  Chronic anxiety disorder on ativan: daughter requesting to schedule her Ativan to 0.5 mg nighttime changed as per request.  Posterior left acetabular wall fracture of indeterminate age: Noted on the CT of the hip with a small fracture 11/22, seen by [facility], recommended PWB with assistance of a walker, Follow in office as outpt 3 weeks for repeat xrays   Klebsiella UTI: Completed antibiotics  T2DM with intermittent hyperglycemia due to steroids: Blood sugar well-controlled.  Continue continue SSI Semglee 9 units bedtime and 2 units Premeal  Recent Labs  Lab 11/15/22 0747 11/15/22 1121 11/15/22 1655 11/15/22 2201 11/16/22 0708  GLUCAP 158* 103* 243* 157* 159*   Hypertension:BP stable on Coreg hydralazine Demadex  HLD stable continue Zetia and Zocor  Hypothyroidism continue Synthroid    Thrombocytopenia, chronic:Does has ecchymosis ,but no overt bleeding.Management per hematology oncology Dr. Marin Olp Recent Labs  Lab 11/12/22 0500 11/13/22 0523 11/14/22 0500 11/15/22 0245 11/16/22 0530  PLT 118* 124* 115* 118* 108*    Failure to  thrive Goals of care: Appears debilitated, deconditioned, prognosis not bright in the setting of cancer continue palliative care, dietitian input supportive care .  She is DNI status.  Class II Obesity:Patient's Body mass index is 37.61 kg/m. : Will benefit with PCP follow-up, weight loss  healthy lifestyle and outpatient sleep evaluation.   DVT prophylaxis: heparin injection 5,000 Units Start: 10/25/22 1400 SCDs Start: 10/25/22 0220 Code Status:   Code Status: Partial Code Family Communication: plan of care discussed with patient at bedside.  I reached out and updated daughter over the phone. Patient status is: Inpatient because of ongoing need for radiation therapy inpatient Level of care: Telemetry   Dispo: The patient is from: Lives at daughter's home            Anticipated disposition: SNF Objective: Vitals last 24 hrs: Vitals:   11/15/22 1345 11/15/22 1911 11/15/22 2152 11/16/22 0636  BP: (!) 125/51  (!) 123/50 (!) 125/50  Pulse: 66  60 (!) 59  Resp: _0 Temp: 97.6 F (36.4 C)  97.7 F (36.5 C) 97.8 F (36.6 C)  TempSrc: Oral  Oral Oral  SpO2: 95% 94% 96% 95%  Weight:      Height:       Weight change:   Physical Examination: General exam: AA0,please, appears weak,older appearing HEENT:Oral mucosa moist, Ear/Nose WNL grossly, dentition normal. Respiratory system: bilaterally basal crackles, no use of accessory muscle Cardiovascular system: S1 & S2 +, regular rate, JVD neg. Gastrointestinal system: Abdomen soft, obese/full, NT,ND,BS+ Nervous System:Alert, awake, moving extremities and grossly nonfocal Extremities: LE ankle edema +, lower extremities warm Skin: No rashes,no icterus. MSK: Normal muscle bulk,tone, power   Medications reviewed:  Scheduled Meds:  albuterol  2.5 mg Nebulization TID   carvedilol  12.5 mg Oral BID WC   Chlorhexidine Gluconate Cloth  6 each Topical Daily   dexamethasone  4 mg Oral Q12H   docusate sodium  100 mg Oral BID    dronabinol  2.5 mg Oral QAC lunch   feeding supplement  237 mL Oral TID BM   fentaNYL  1 patch Transdermal Q72H   fluconazole  100 mg Oral Daily   heparin  5,000 Units Subcutaneous Q8H   hydrALAZINE  50 mg Oral Q8H   insulin aspart  0-5 Units Subcutaneous QHS   insulin aspart  0-9 Units Subcutaneous TID WC   insulin aspart  2 Units Subcutaneous TID WC   insulin glargine-yfgn  9 Units Subcutaneous QHS   levothyroxine  25 mcg Oral Q MTWThF   lidocaine  1 patch Transdermal QHS   metolazone  5 mg Oral Q24H   multivitamin with minerals  1 tablet Oral Daily   nystatin  5 mL Oral QID   oxybutynin  5 mg Oral Daily   senna-docusate  1 tablet Oral QHS   sodium zirconium cyclosilicate  10 g Oral Daily   torsemide  20 mg Oral Daily   Continuous Infusions:  sodium chloride        Diet Order             Diet regular Room service appropriate? Yes; Fluid consistency: Thin  Diet effective now                    Nutrition Problem: Increased nutrient needs Etiology: acute illness, chronic illness Signs/Symptoms: estimated needs Interventions: Ensure Enlive (each supplement provides 350kcal and 20 grams of protein), Refer to RD note for recommendations   Intake/Output Summary (Last 24 hours) at 11/16/2022 1235 Last data filed at 11/16/2022 1000 Gross per 24 hour  Intake 600 ml  Output 1000 ml  Net -400 ml   Net IO Since Admission: 6,174.21 mL [11/16/22 1235]  Wt Readings from Last 3 Encounters:  11/15/22 96.3 kg  10/20/22 82.9 kg  09/04/22 85.8 kg     Unresulted Labs (From admission, onward)     Start     Ordered   11/16/22 1118  Glucose, capillary  Once,   R        11/16/22 1118   11/16/22 1118  Glucose, capillary  Once,   R        11/16/22 1118   11/16/22 2620  Basic metabolic panel  Daily,   R     Question:  Specimen collection method  Answer:  Lab=Lab collect   11/15/22 0756   11/16/22 0500  CBC  Daily,   R     Question:  Specimen collection method  Answer:   Lab=Lab collect   11/15/22 0756          Data Reviewed: I have personally reviewed following labs and imaging studies CBC: Recent Labs  Lab 11/12/22 0500 11/13/22 0523 11/14/22 0500 11/15/22 0245 11/16/22 0530  WBC 4.1 5.3 5.4 5.5 5.3  HGB 10.5* 10.2* 10.7* 10.3* 9.8*  HCT 34.6* 33.3* 34.6* 33.9* 31.9*  MCV 96.1 94.9 95.1 95.5 95.5  PLT 118* 124* 115* 118* 355*   Basic Metabolic Panel: Recent Labs  Lab 11/11/22 0729 11/12/22 0500 11/13/22 0523 11/14/22 0500 11/15/22 0245 11/16/22 0530  NA 140 140 139 139 140 142  K 5.6* 5.8* 5.7* 5.3* 5.2* 4.7  CL 109 110 109 107 108  106  CO2 _0 GLUCOSE 152* 164* 163* 194* 170* 157*  BUN 87* 90* 72* 84* 71* 71*  CREATININE 2.02* 1.80* 1.65* 1.44* 1.41* 1.41*  CALCIUM 6.9* 6.6* 6.6* 7.0* 7.4* 7.5*  MG 3.4* 3.5* 3.6* 3.3* 2.8*  --    GFR: Estimated Creatinine Clearance: 32.2 mL/min (A) (by C-G formula based on SCr of 1.41 mg/dL (H)). Liver Function Tests: Recent Labs  Lab 11/10/22 0621 11/11/22 0729  AST 31 34  ALT 16 19  ALKPHOS 88 88  BILITOT 0.6 0.8  PROT 5.4* 6.0*  ALBUMIN 2.6* 2.9*   No results found for this or any previous visit (from the past 240 hour(s)).   Antimicrobials: Anti-infectives (From admission, onward)    Start     Dose/Rate Route Frequency Ordered Stop   11/16/22 1000  fluconazole (DIFLUCAN) tablet 100 mg        100 mg Oral Daily 11/16/22 0658     11/13/22 1000  azithromycin (ZITHROMAX) tablet 500 mg  Status:  Discontinued        500 mg Oral Daily 11/13/22 0732 11/16/22 0722   11/11/22 0800  azithromycin (ZITHROMAX) 500 mg in sodium chloride 0.9 % 250 mL IVPB  Status:  Discontinued        500 mg 250 mL/hr over 60 Minutes Intravenous Every 24 hours 11/11/22 0717 11/13/22 0732   12/01/2022 0800  meropenem (MERREM) 500 mg in sodium chloride 0.9 % 100 mL IVPB        500 mg 200 mL/hr over 30 Minutes Intravenous 2 times daily 11/21/2022 0637 11/07/22 2316   11/02/22 0800  vancomycin  (VANCOCIN) IVPB 1000 mg/200 mL premix        1,000 mg 200 mL/hr over 60 Minutes Intravenous To Radiology 11/02/22 0710 11/11/2022 0800   10/25/22 0715  cefTRIAXone (ROCEPHIN) 1 g in sodium chloride 0.9 % 100 mL IVPB  Status:  Discontinued        1 g 200 mL/hr over 30 Minutes Intravenous Every 24 hours 10/25/22 0701 10/27/22 1515      Culture/Microbiology    Component Value Date/Time   SDES URINE, CLEAN CATCH 10/28/2022 0208   SPECREQUEST NONE 10/28/2022 0208   CULT  10/28/2022 0208    NO GROWTH Performed at [facility], [place] [address] [place], [place] [address]    REPTSTATUS 10/29/2022 FINAL 10/28/2022 0208   Radiology Studies: [place] ASCITES (ABDOMEN LIMITED)  Result Date: 11/16/2022 CLINICAL DATA:  Decompensated hepatic cirrhosis EXAM: LIMITED ABDOMEN ULTRASOUND FOR ASCITES TECHNIQUE: Limited ultrasound survey for ascites was performed in all four abdominal quadrants. COMPARISON:  None Available. FINDINGS: Small volume ascites in the right upper quadrant. Partially visualized liver demonstrates nodular contours consistent with history of cirrhosis. IMPRESSION: Small volume ascites in the right upper quadrant. Electronically Signed   By: Maurine Simmering M.D.   On: 11/16/2022 09:30   ECHOCARDIOGRAM COMPLETE  Result Date: 11/15/2022    ECHOCARDIOGRAM REPORT   Patient Name:   Margaret Dyer Date of Exam: 11/15/2022 Medical Rec #:  [id number]     Height:       63.0 in Accession #:    [id number]    Weight:       212.3 lb Date of Birth:  Jul 31, 1935     BSA:          1.983 m Patient Age:    63 years      BP:  134/46 mmHg Patient Gender: F             HR:           60 bpm. Exam Location:  Inpatient Procedure: 2D Echo, Color Doppler and Cardiac Doppler Indications:    Chemo  History:        Patient has prior history of Echocardiogram examinations, most                 recent 05/26/2021. CHF; Risk Factors:Hypertension, Diabetes,                 Dyslipidemia, Sleep Apnea and Lung mets.   Sonographer:    Raquel Sarna Senior RDCS Referring Phys: [place] Comments: Poor apical window due to body habitus/lung interference, wall motion obtained from short axis. IMPRESSIONS  1. Left ventricular ejection fraction, by estimation, is 60 to 65%. The left ventricle has normal function. The left ventricle has no regional wall motion abnormalities. Left ventricular diastolic parameters are consistent with Grade III diastolic dysfunction (restrictive). Elevated left ventricular end-diastolic pressure. The E/e' is 33.5.  2. Right ventricular systolic function is normal. The right ventricular size is normal. There is moderately elevated pulmonary artery systolic pressure. The estimated right ventricular systolic pressure is 92.9 mmHg.  3. Left atrial size was mild to moderately dilated.  4. A small pericardial effusion is present. The pericardial effusion is circumferential.  5. The mitral valve is grossly normal. Mild mitral valve regurgitation. No evidence of mitral stenosis.  6. The aortic valve is tricuspid. Aortic valve regurgitation is not visualized. No aortic stenosis is present.  7. The inferior vena cava is dilated in size with <50% respiratory variability, suggesting right atrial pressure of 15 mmHg. Comparison(s): Changes from prior study are noted. EF unchanged. Grade 3 DD. Elevated pulmonary pressures. FINDINGS  Left Ventricle: Left ventricular ejection fraction, by estimation, is 60 to 65%. The left ventricle has normal function. The left ventricle has no regional wall motion abnormalities. The left ventricular internal cavity size was normal in size. There is  no left ventricular hypertrophy. Left ventricular diastolic parameters are consistent with Grade III diastolic dysfunction (restrictive). Elevated left ventricular end-diastolic pressure. The E/e' is 33.5. Right Ventricle: The right ventricular size is normal. No increase in right ventricular wall thickness. Right ventricular  systolic function is normal. There is moderately elevated pulmonary artery systolic pressure. The tricuspid regurgitant velocity is 2.74 m/s, and with an assumed right atrial pressure of 15 mmHg, the estimated right ventricular systolic pressure is 57.4 mmHg. Left Atrium: Left atrial size was mild to moderately dilated. Right Atrium: Right atrial size was normal in size. Pericardium: A small pericardial effusion is present. The pericardial effusion is circumferential. Presence of epicardial fat layer. Mitral Valve: The mitral valve is grossly normal. Mild mitral valve regurgitation. No evidence of mitral valve stenosis. Tricuspid Valve: The tricuspid valve is grossly normal. Tricuspid valve regurgitation is mild . No evidence of tricuspid stenosis. Aortic Valve: The aortic valve is tricuspid. Aortic valve regurgitation is not visualized. No aortic stenosis is present. Pulmonic Valve: The pulmonic valve was grossly normal. Pulmonic valve regurgitation is mild. No evidence of pulmonic stenosis. Aorta: The aortic root and ascending aorta are structurally normal, with no evidence of dilitation. Venous: The inferior vena cava is dilated in size with less than 50% respiratory variability, suggesting right atrial pressure of 15 mmHg. IAS/Shunts: The atrial septum is grossly normal.  LEFT VENTRICLE PLAX 2D LVIDd:  4.90 cm   Diastology LVIDs:         2.70 cm   LV e' medial:    3.70 cm/s LV PW:         1.20 cm   LV E/e' medial:  33.5 LV IVS:        1.00 cm   LV e' lateral:   5.11 cm/s LVOT diam:     1.90 cm   LV E/e' lateral: 24.3 LV SV:         75 LV SV Index:   38 LVOT Area:     2.84 cm  RIGHT VENTRICLE RV S prime:     13.30 cm/s TAPSE (M-mode): 2.1 cm LEFT ATRIUM              Index        RIGHT ATRIUM           Index LA diam:        3.90 cm  1.97 cm/m   RA Area:     18.10 cm LA Vol (A2C):   102.0 ml 51.43 ml/m  RA Volume:   45.00 ml  22.69 ml/m LA Vol (A4C):   74.4 ml  37.51 ml/m LA Biplane Vol: 91.1 ml  45.93  ml/m  AORTIC VALVE LVOT Vmax:   112.00 cm/s LVOT Vmean:  77.100 cm/s LVOT VTI:    0.265 m  AORTA Ao Root diam: 3.30 cm Ao Asc diam:  3.50 cm MITRAL VALVE                TRICUSPID VALVE MV Area (PHT): 4.04 cm     TR Peak grad:   30.0 mmHg MV Decel Time: 188 msec     TR Vmax:        274.00 cm/s MV E velocity: 124.00 cm/s MV A velocity: 56.70 cm/s   SHUNTS MV E/A ratio:  2.19         Systemic VTI:  0.26 m                             Systemic Diam: 1.90 cm Eleonore Chiquito MD Electronically signed by Eleonore Chiquito MD Signature Date/Time: 11/15/2022/9:51:01 AM    Final    DG Chest 2 View  Result Date: 11/14/2022 CLINICAL DATA:  Pneumonia EXAM: CHEST - 2 VIEW COMPARISON:  11/10/2022 FINDINGS: Frontal and lateral views of the chest demonstrate stable right chest wall port. Stable bilateral pulmonary nodules are seen, consistent with known pulmonary metastases. There is progressive multifocal bilateral airspace disease, greatest at the left lung base. Bilateral pleural effusions. No pneumothorax. Stable cardiac silhouette. No acute bony abnormalities. IMPRESSION: 1. Progressive multifocal bilateral airspace disease with bilateral pleural effusions. Findings could reflect worsening edema or infection. 2. Numerous pulmonary nodules, better seen on recent CT, consistent with known metastatic renal cell carcinoma. Electronically Signed   By: Randa Ngo M.D.   On: 11/14/2022 15:13     LOS: 22 days   Antonieta Pert, MD [facility]  11/16/2022, 12:35 PM

## 2022-11-16 NOTE — Progress Notes (Addendum)
Physical Therapy Treatment Patient Details Name: Margaret Dyer MRN: [id number] DOB: [birth year] Today's Date: 11/16/2022   History of Present Illness 87 yo female presents to [facility] on 11/21 with weakness, back/L hip pain, fall x1 month ago.  Pt found to have compression vs pathologic fracture of L4, L acetabular fx. CT scan of the chest/abdomen/pelvis shows multiple bilat pulmonary mets, hepatic mets. S/p liver mass biopsy 11/27, plan for KP on 11/30 but pt too ShOB, CXR shows R PNA. PMH significant for chronic respiratory failure on 3.5LO2 at baseline, DM2, HTN, HLD, chronic diastolic CHF, pulmonary hypertension, liver cirrhosis, CKD 3, renal cell carcinoma with left nephrectomy 2000, hypothyroidism, osteopenia, L hip IMN 2020.    PT Comments    Pt seen for PT tx with pt received in bed. Pt with elevated systolic BP & nurse aware but requesting pt attempt sitting/clearing EOB to allow bed scale to be zeroed out. Pt requires mod assist for bed mobility with use of bed rails & HOB elevated. PT provides multimodal cuing for log rolling but pt appears to have decreased awareness/understanding. PT provides total assist for donning/doffing LSO sitting EOB. Pt is able to transfer STS with +1 assist but only from elevated EOB & tolerates standing <30 seconds. Pt reporting fatigue after standing attempts & assisted back supine. Continue to recommend STR upon d/c. Will continue to follow pt acutely to address strengthening, activity tolerance, and transfers & gait with LRAD.   Addendum: Reviewed PT goals & updated dates as all goals remain appropriate at this time.   Recommendations for follow up therapy are one component of a multi-disciplinary discharge planning process, led by the attending physician.  Recommendations may be updated based on patient status, additional functional criteria and insurance authorization.  Follow Up Recommendations  Skilled nursing-short term rehab (<3 hours/day) Can patient  physically be transported by private vehicle: No   Assistance Recommended at Discharge Frequent or constant Supervision/Assistance  Patient can return home with the following A lot of help with bathing/dressing/bathroom;Two people to help with walking and/or transfers;Assist for transportation;Help with stairs or ramp for entrance;Assistance with cooking/housework   Equipment Recommendations  None recommended by PT    Recommendations for Other Services       Precautions / Restrictions Precautions Precautions: Fall;Back Required Braces or Orthoses: Other Brace Other Brace: LSO Restrictions Weight Bearing Restrictions: Yes LLE Weight Bearing: Partial weight bearing LLE Partial Weight Bearing Percentage or Pounds: none specified, ortho states "PWB with assistance of a walker"     Mobility  Bed Mobility Overal bed mobility: Needs Assistance Bed Mobility: Rolling, Sidelying to Sit, Sit to Supine Rolling: Mod assist Sidelying to sit: Mod assist, HOB elevated   Sit to supine: Mod assist   General bed mobility comments: PT provides max multimodal cuing for log rolling with pt demonstrating decreased awareness of this. Pt requires HOB elevated & bed rails to complete. Pt requires max assist to scoot to [facility] with bed in trendelenburg position and with cuing to push with BLE & pull with BUE.    Transfers Overall transfer level: Needs assistance Equipment used: Rolling walker (2 wheels) Transfers: Sit to/from Stand Sit to Stand: Mod assist, Max assist           General transfer comment: STS from elevated EOB x 2, cuing for hand placement to push to standing then transition BUE to RW. Pt with forward trunk flexion, requiring cuing/assistance to upright trunk. Tolerates standing <30 seconds.    Ambulation/Gait  Stairs             Wheelchair Mobility    Modified Rankin (Stroke Patients Only)       Balance Overall balance assessment: Needs  assistance Sitting-balance support: No upper extremity supported, Feet supported, Bilateral upper extremity supported Sitting balance-Leahy Scale: Fair     Standing balance support: Reliant on assistive device for balance, Bilateral upper extremity supported Standing balance-Leahy Scale: Zero                              Cognition Arousal/Alertness: Awake/alert Behavior During Therapy: WFL for tasks assessed/performed, Flat affect Overall Cognitive Status: Within Functional Limits for tasks assessed                                          Exercises      General Comments General comments (skin integrity, edema, etc.): Pt on 3L/min via nasal cannula with lowest SpO2 of 87%, pt c/o slight lightheadedness upon sitting EOB but reports symptoms do not worsen with time.      Pertinent Vitals/Pain Pain Assessment Pain Assessment: Faces Faces Pain Scale: Hurts a little bit Pain Location: general Pain Descriptors / Indicators: Discomfort Pain Intervention(s): Monitored during session    Home Living                          Prior Function            PT Goals (current goals can now be found in the care plan section) Acute Rehab PT Goals Patient Stated Goal: i want to rest PT Goal Formulation: With patient Time For Goal Achievement: 11/30/22 Potential to Achieve Goals: Fair Progress towards PT goals: Progressing toward goals    Frequency    Min 2X/week      PT Plan Current plan remains appropriate    Co-evaluation              AM-PAC PT "6 Clicks" Mobility   Outcome Measure  Help needed turning from your back to your side while in a flat bed without using bedrails?: A Lot Help needed moving from lying on your back to sitting on the side of a flat bed without using bedrails?: Total Help needed moving to and from a bed to a chair (including a wheelchair)?: Total Help needed standing up from a chair using your arms (e.g.,  wheelchair or bedside chair)?: Total Help needed to walk in hospital room?: Total Help needed climbing 3-5 steps with a railing? : Total 6 Click Score: 7    End of Session Equipment Utilized During Treatment: Oxygen;Back brace Activity Tolerance: Patient limited by fatigue Patient left: in bed;with bed alarm set;with call bell/phone within reach   PT Visit Diagnosis: Other abnormalities of gait and mobility (R26.89);Muscle weakness (generalized) (M62.81);Difficulty in walking, not elsewhere classified (R26.2)     Time: 1340-1403 PT Time Calculation (min) (ACUTE ONLY): 23 min  Charges:  $Therapeutic Activity: 23-37 mins                     Lavone Nian, PT, DPT 11/16/22, 2:44 PM  Waunita Schooner 11/16/2022, 2:09 PM

## 2022-11-17 ENCOUNTER — Other Ambulatory Visit: Payer: Self-pay

## 2022-11-17 ENCOUNTER — Inpatient Hospital Stay (HOSPITAL_COMMUNITY): Payer: Medicare Other

## 2022-11-17 ENCOUNTER — Ambulatory Visit
Admit: 2022-11-17 | Discharge: 2022-11-17 | Disposition: A | Discharge status: Home / Self Care | Payer: Medicare Other | Attending: Radiation Oncology | Admitting: Radiation Oncology

## 2022-11-17 DIAGNOSIS — R52 Pain, unspecified: Secondary | ICD-10-CM | POA: Diagnosis not present

## 2022-11-17 DIAGNOSIS — C220 Liver cell carcinoma: Secondary | ICD-10-CM | POA: Diagnosis not present

## 2022-11-17 DIAGNOSIS — Z510 Encounter for antineoplastic radiation therapy: Secondary | ICD-10-CM | POA: Diagnosis not present

## 2022-11-17 DIAGNOSIS — C7951 Secondary malignant neoplasm of bone: Secondary | ICD-10-CM | POA: Diagnosis not present

## 2022-11-17 LAB — RAD ONC ARIA SESSION SUMMARY
Course Elapsed Days: 7
Plan Fractions Treated to Date: 6
Plan Prescribed Dose Per Fraction: 3 Gy
Plan Total Fractions Prescribed: 10
Plan Total Prescribed Dose: 30 Gy
Reference Point Dosage Given to Date: 18 Gy
Reference Point Session Dosage Given: 3 Gy
Session Number: 6

## 2022-11-17 LAB — GLUCOSE, CAPILLARY
Glucose-Capillary: 121 mg/dL — ABNORMAL HIGH (ref 70–99)
Glucose-Capillary: 79 mg/dL (ref 70–99)
Glucose-Capillary: 221 mg/dL — ABNORMAL HIGH (ref 70–99)
Glucose-Capillary: 248 mg/dL — ABNORMAL HIGH (ref 70–99)

## 2022-11-17 LAB — BASIC METABOLIC PANEL
Anion gap: 10 (ref 5–15)
BUN: 74 mg/dL — ABNORMAL HIGH (ref 8–23)
CO2: 29 mmol/L (ref 22–32)
Calcium: 7.6 mg/dL — ABNORMAL LOW (ref 8.9–10.3)
Chloride: 101 mmol/L (ref 98–111)
Creatinine, Ser: 1.28 mg/dL — ABNORMAL HIGH (ref 0.44–1.00)
GFR, Estimated: 41 mL/min — ABNORMAL LOW (ref >60)
Glucose, Bld: 127 mg/dL — ABNORMAL HIGH (ref 70–99)
Potassium: 4.4 mmol/L (ref 3.5–5.1)
Sodium: 140 mmol/L (ref 135–145)

## 2022-11-17 LAB — CBC
HCT: 34.2 % — ABNORMAL LOW (ref 36.0–46.0)
Hemoglobin: 10.5 g/dL — ABNORMAL LOW (ref 12.0–15.0)
MCH: 29 pg (ref 26.0–34.0)
MCHC: 30.7 g/dL (ref 30.0–36.0)
MCV: 94.5 fL (ref 80.0–100.0)
Platelets: 98 10*3/uL — ABNORMAL LOW (ref 150–400)
RBC: 3.62 MIL/uL — ABNORMAL LOW (ref 3.87–5.11)
RDW: 18.8 % — ABNORMAL HIGH (ref 11.5–15.5)
WBC: 5.9 10*3/uL (ref 4.0–10.5)
nRBC: 0 % (ref 0.0–0.2)

## 2022-11-17 NOTE — Progress Notes (Signed)
PROGRESS NOTE Margaret Dyer  HBZ:[id number] DOB: 1935-08-01 DOA: 10/28/2022 PCP: Ann Held, DO   Brief Narrative/Hospital Course: 87 year old with history of DM2, HTN, HLD, diastolic CHF, pulmonary HTN, cirrhosis, CKD stage IIIa, renal cell carcinoma with left nephrectomy in 2000, hypothyroidism presented to hospital with intractable back pain currently admitted with working diagnosis of metastatic hepatic cellular carcinoma, respiratory failure.  Her CT scan showed compression fracture of L4 with innumerable bilateral pulmonary metastases, posterior left acetabular wall fracture.  MRI confirmed these findings and patient was admitted to the hospital.  Radiology and oncology were consulted.  IR procedure for kyphoplasty or biopsy was postponed as patient was unable to lay flat.  Patient was transferred to [facility] for radiation treatments.  Since being here patient is getting pain control, Chemo-Port placed on 12/8. Hospital course complicated by hyperkalemia.  Due to some respiratory distress, CT chest was done which showed signs of volume overload, some reduction in pulmonary nodules.  Her respiratory status,  hydration and renal function has been very tenuous therefore off-and-on requiring IV fluids and diuretics.  12/13: Ultrasound shows small volume ascites, patient continues to remain short of breath placed on scheduled Lasix iv   Subjective: Seen and examined this morning.  She feels about the same still short of breath. She was on her way to radiation therapy and come back. Overnight she has been afebrile BP stable on 3 to 3.5 L of cannula Labs reviewed creatinine>downtrending 1.2 platelet slightly dropping 98 K 2V chest x-ray ordered this morning per oncology  Assessment and Plan: Compression fracture L4 with concern for pathological fracture and metastasis Intractable back pain from above: Had extensive imaging with MRI, CT of the pelvis.Has 30% vertebral height loss  L4.Kyphoplasty and biopsy on hold as patient unable to tolerate due to pain and discomfort. Oncology following s/p chemo-Port placement, xrt started 12/8 through 12/27 for total 10 treatment.   Does not complain  pain, cont with tramadol, fentanyl patch, lidocaine patch tylenol  and Decadron per oncology. Cont PTOT.  Innumerable b/l pulmonary metastasis History of renal cell CA with left nephrectomy CT scan obtained on admission showed innumerable bilateral pulmonary metastases as well as possible liver mets and bone mets.Oncology suspecting metastatic hepatocellular carcinoma due to elevated AFP.  continue XR. S/P liver biopsy-showed cirrhosis but no malignant disease  Acute on chronic hypoxic respiratory failure Shortness of breath ongoing: Patient's shortness of breath and respiratory multifactor with CHF COPD pneumonia anasarca deconditioning debility.  Continue to optimize volume status as below with IV Lasix, chest x-ray pending this morning. Grade 3 Diastolic dysfunction suspect acute on chronic disease/CHF: With ongoing shortness of breath leg edema started IV Lasix 40 mg daily>held Demadex 12/14. Cont to monitor intake output Daily weight as below. Anasarca with leg edema/bilateral pleural effusion Cirrhosis>liver biopsy showed Cirrhosis (stage 4 of 4) of uncertain etiology, mild active steatohepatitis Small volume ascites on ultrasound 12/14:  CT chest 12/9: Moderate bilateral pleural effusion, pulmonary nodules, small volume ascites, cirrhosis, cardiomegaly,  BNP was 879,Procalcitonin 0.5.Echo  12/13>EF 60 to 65%, G3 DD restrictive, moderately elevated PASP.  S/p IV Lasix and albumin 12/13.  Continue IV Lasix as above, monitor intake output Daily weight. Her weight was 189.1 lb on 11/15/2022> now at 228>212 lb> trending down as belowNet IO Since Admission: 5,814.21 mL [11/17/22 0805]  Filed Weights   11/15/22 0635 11/16/22 1200 11/17/22 0500  Weight: 96.3 kg 97.8 kg 96 kg   COPD: on home O2  3.5 l>  here needing 3-4l [place].continue bronchodilators scheduled and as needed, continue diuretics as above.  Continue supplemental oxygen.  Hyperkalemia: K improved discontinue Lokelma today  Recent Labs  Lab 11/13/22 0523 11/14/22 0500 11/15/22 0245 11/16/22 0530 11/17/22 0555  K 5.7* 5.3* 5.2* 4.7 4.4     Hypocalcemia stable Recent Labs  Lab 11/13/22 0523 11/14/22 0500 11/15/22 0245 11/16/22 0530 11/17/22 0555  CALCIUM 6.6* 7.0* 7.4* 7.5* 7.6*    CKD stage IV, history of left nephrectomy: Baseline creatinine close to 1.7 currently improved to 1.4. 1.2, tolerating reduced.  Monitor closely. Recent Labs    11/08/22 0604 11/09/22 0639 11/10/22 0621 11/11/22 0729 11/12/22 0500 11/13/22 0523 11/14/22 0500 11/15/22 0245 11/16/22 0530 11/17/22 0555  BUN 57* 70* 76* 87* 90* 72* 84* 71* 71* 74*  CREATININE 2.06* 2.09* 1.90* 2.02* 1.80* 1.65* 1.44* 1.41* 1.41* 1.28*    Pneumonia: completed meropenem 12/1-12/5  Chronic anxiety disorder on ativan: daughter requesting to schedule her Ativan to 0.5 mg nighttime changed as per request.  Posterior left acetabular wall fracture of indeterminate age: Noted on the CT of the hip with a small fracture 11/22, seen by [facility], recommended PWB with assistance of a walker, Follow in office as outpt 3 weeks for repeat xrays   Klebsiella UTI: Completed antibiotics  T2DM with intermittent hyperglycemia due to steroids: Blood sugar stable as below, continue SSI Semglee 9 units bedtime and 2 units Premeal  Recent Labs  Lab 11/16/22 1118 11/16/22 1643 11/16/22 2009 11/16/22 2204 11/17/22 0735  GLUCAP 150*  150* 311* 205* 212* 121*    Hypertension:BP stable on Coreg hydralazine Demadex  HLD stable continue Zetia and Zocor  Hypothyroidism continue Synthroid    Thrombocytopenia, chronic:Does has ecchymosis ,but no overt bleeding.hematology monitoring.   Recent Labs  Lab 11/13/22 0523 11/14/22 0500 11/15/22 0245  11/16/22 0530 11/17/22 0555  PLT 124* 115* 118* 108* 98*     Failure to thrive Goals of care: Appears debilitated, deconditioned, prognosis not bright in the setting of cancer continue palliative care, dietitian input supportive care .  She is DNI status.  Class II Obesity:Patient's Body mass index is 37.49 kg/m. : Will benefit with PCP follow-up, weight loss  healthy lifestyle and outpatient sleep evaluation.  DVT prophylaxis: heparin injection 5,000 Units Start: 10/25/22 1400 SCDs Start: 10/25/22 0220 Code Status:   Code Status: Partial Code Family Communication: plan of care discussed with patient at bedside.  I had updated patient's daughter at the bedside and on the phone previously.   Patient status is: Inpatient because of ongoing need for radiation therapy inpatient Level of care: Telemetry  Dispo: The patient is from: Lives at daughter's home            Anticipated disposition: SNF Objective: Vitals last 24 hrs: Vitals:   11/17/22 0400 11/17/22 0500 11/17/22 0600 11/17/22 0608  BP:    (!) 140/44  Pulse:    (!) 53  Resp: _0 Temp:    97.7 F (36.5 C)  TempSrc:    Oral  SpO2:    98%  Weight:  96 kg    Height:       Weight change:   Physical Examination: General exam: AAox3, obese, ill looking, frail weak,older appearing HEENT:Oral mucosa moist, Ear/Nose WNL grossly, dentition normal. Respiratory system: bilaterally diminished BS,no use of accessory muscle Cardiovascular system: S1 & S2 +, regular rate. Gastrointestinal system: Abdomen soft, fullNT,ND,BS+ Nervous System:Alert, awake, moving extremities and grossly nonfocal Extremities: LE ankle  edema ++, lower extremities warm Skin: No rashes,no icterus. MSK: weak muscle tone, power   Medications reviewed:  Scheduled Meds:  albuterol  2.5 mg Nebulization TID   carvedilol  12.5 mg Oral BID WC   Chlorhexidine Gluconate Cloth  6 each Topical Daily   dexamethasone  4 mg Oral Q12H   docusate sodium   100 mg Oral BID   dronabinol  2.5 mg Oral QAC lunch   feeding supplement  237 mL Oral TID BM   fentaNYL  1 patch Transdermal Q72H   fluconazole  100 mg Oral Daily   furosemide  40 mg Intravenous Daily   heparin  5,000 Units Subcutaneous Q8H   hydrALAZINE  50 mg Oral Q8H   insulin aspart  0-5 Units Subcutaneous QHS   insulin aspart  0-9 Units Subcutaneous TID WC   insulin aspart  2 Units Subcutaneous TID WC   insulin glargine-yfgn  9 Units Subcutaneous QHS   levothyroxine  25 mcg Oral Q MTWThF   lidocaine  1 patch Transdermal QHS   LORazepam  0.5 mg Oral QHS   metolazone  5 mg Oral Q24H   multivitamin with minerals  1 tablet Oral Daily   nystatin  5 mL Oral QID   oxybutynin  5 mg Oral Daily   senna-docusate  1 tablet Oral QHS   Continuous Infusions:  sodium chloride        Diet Order             Diet regular Room service appropriate? Yes; Fluid consistency: Thin  Diet effective now                    Nutrition Problem: Increased nutrient needs Etiology: acute illness, chronic illness Signs/Symptoms: estimated needs Interventions: Ensure Enlive (each supplement provides 350kcal and 20 grams of protein), Refer to RD note for recommendations   Intake/Output Summary (Last 24 hours) at 11/17/2022 0805 Last data filed at 11/17/2022 0618 Gross per 24 hour  Intake 580 ml  Output 1000 ml  Net -420 ml    Net IO Since Admission: 5,814.21 mL [11/17/22 0805]  Wt Readings from Last 3 Encounters:  11/17/22 96 kg  10/20/22 82.9 kg  09/04/22 85.8 kg     Unresulted Labs (From admission, onward)     Start     Ordered   11/18/22 0500  Comprehensive metabolic panel  Daily,   R     Question:  Specimen collection method  Answer:  Unit=Unit collect   11/17/22 0700   11/18/22 0500  Prealbumin  Tomorrow morning,   R       Question:  Specimen collection method  Answer:  Unit=Unit collect   11/17/22 0700   11/16/22 0500  CBC  Daily,   R     Question:  Specimen collection  method  Answer:  Lab=Lab collect   11/15/22 0756          Data Reviewed: I have personally reviewed following labs and imaging studies CBC: Recent Labs  Lab 11/13/22 0523 11/14/22 0500 11/15/22 0245 11/16/22 0530 11/17/22 0555  WBC 5.3 5.4 5.5 5.3 5.9  HGB 10.2* 10.7* 10.3* 9.8* 10.5*  HCT 33.3* 34.6* 33.9* 31.9* 34.2*  MCV 94.9 95.1 95.5 95.5 94.5  PLT 124* 115* 118* 108* 98*    Basic Metabolic Panel: Recent Labs  Lab 11/11/22 0729 11/12/22 0500 11/13/22 0523 11/14/22 0500 11/15/22 0245 11/16/22 0530 11/17/22 0555  NA 140 140 139 139 140 142 140  K 5.6*  5.8* 5.7* 5.3* 5.2* 4.7 4.4  CL 109 110 109 107 108 106 101  CO2 _0 GLUCOSE 152* 164* 163* 194* 170* 157* 127*  BUN 87* 90* 72* 84* 71* 71* 74*  CREATININE 2.02* 1.80* 1.65* 1.44* 1.41* 1.41* 1.28*  CALCIUM 6.9* 6.6* 6.6* 7.0* 7.4* 7.5* 7.6*  MG 3.4* 3.5* 3.6* 3.3* 2.8*  --   --     GFR: Estimated Creatinine Clearance: 35.4 mL/min (A) (by C-G formula based on SCr of 1.28 mg/dL (H)). Liver Function Tests: Recent Labs  Lab 11/11/22 0729  AST 34  ALT 19  ALKPHOS 88  BILITOT 0.8  PROT 6.0*  ALBUMIN 2.9*    No results found for this or any previous visit (from the past 240 hour(s)).   Antimicrobials: Anti-infectives (From admission, onward)    Start     Dose/Rate Route Frequency Ordered Stop   11/16/22 1000  fluconazole (DIFLUCAN) tablet 100 mg        100 mg Oral Daily 11/16/22 0658     11/13/22 1000  azithromycin (ZITHROMAX) tablet 500 mg  Status:  Discontinued        500 mg Oral Daily 11/13/22 0732 11/16/22 0722   11/11/22 0800  azithromycin (ZITHROMAX) 500 mg in sodium chloride 0.9 % 250 mL IVPB  Status:  Discontinued        500 mg 250 mL/hr over 60 Minutes Intravenous Every 24 hours 11/11/22 0717 11/13/22 0732   11/19/2022 0800  meropenem (MERREM) 500 mg in sodium chloride 0.9 % 100 mL IVPB        500 mg 200 mL/hr over 30 Minutes Intravenous 2 times daily 11/21/2022 0637 11/07/22  2316   11/02/22 0800  vancomycin (VANCOCIN) IVPB 1000 mg/200 mL premix        1,000 mg 200 mL/hr over 60 Minutes Intravenous To Radiology 11/02/22 0710 11/24/2022 0800   10/25/22 0715  cefTRIAXone (ROCEPHIN) 1 g in sodium chloride 0.9 % 100 mL IVPB  Status:  Discontinued        1 g 200 mL/hr over 30 Minutes Intravenous Every 24 hours 10/25/22 0701 10/27/22 1515      Culture/Microbiology    Component Value Date/Time   SDES URINE, CLEAN CATCH 10/28/2022 0208   SPECREQUEST NONE 10/28/2022 0208   CULT  10/28/2022 0208    NO GROWTH Performed at [facility], [place] [address] [place],  [address]    REPTSTATUS 10/29/2022 FINAL 10/28/2022 0208   Radiology Studies: [place] ASCITES (ABDOMEN LIMITED)  Result Date: 11/16/2022 CLINICAL DATA:  Decompensated hepatic cirrhosis EXAM: LIMITED ABDOMEN ULTRASOUND FOR ASCITES TECHNIQUE: Limited ultrasound survey for ascites was performed in all four abdominal quadrants. COMPARISON:  None Available. FINDINGS: Small volume ascites in the right upper quadrant. Partially visualized liver demonstrates nodular contours consistent with history of cirrhosis. IMPRESSION: Small volume ascites in the right upper quadrant. Electronically Signed   By: Maurine Simmering M.D.   On: 11/16/2022 09:30   ECHOCARDIOGRAM COMPLETE  Result Date: 11/15/2022    ECHOCARDIOGRAM REPORT   Patient Name:   Margaret Dyer Date of Exam: 11/15/2022 Medical Rec #:  [id number]     Height:       63.0 in Accession #:    [id number]    Weight:       212.3 lb Date of Birth:  06/12/1935     BSA:          1.983 m Patient Age:  87 years      BP:           134/46 mmHg Patient Gender: F             HR:           60 bpm. Exam Location:  Inpatient Procedure: 2D Echo, Color Doppler and Cardiac Doppler Indications:    Chemo  History:        Patient has prior history of Echocardiogram examinations, most                 recent 05/26/2021. CHF; Risk Factors:Hypertension, Diabetes,                 Dyslipidemia,  Sleep Apnea and Lung mets.  Sonographer:    Raquel Sarna Senior RDCS Referring Phys: [place] Comments: Poor apical window due to body habitus/lung interference, wall motion obtained from short axis. IMPRESSIONS  1. Left ventricular ejection fraction, by estimation, is 60 to 65%. The left ventricle has normal function. The left ventricle has no regional wall motion abnormalities. Left ventricular diastolic parameters are consistent with Grade III diastolic dysfunction (restrictive). Elevated left ventricular end-diastolic pressure. The E/e' is 33.5.  2. Right ventricular systolic function is normal. The right ventricular size is normal. There is moderately elevated pulmonary artery systolic pressure. The estimated right ventricular systolic pressure is 65.7 mmHg.  3. Left atrial size was mild to moderately dilated.  4. A small pericardial effusion is present. The pericardial effusion is circumferential.  5. The mitral valve is grossly normal. Mild mitral valve regurgitation. No evidence of mitral stenosis.  6. The aortic valve is tricuspid. Aortic valve regurgitation is not visualized. No aortic stenosis is present.  7. The inferior vena cava is dilated in size with <50% respiratory variability, suggesting right atrial pressure of 15 mmHg. Comparison(s): Changes from prior study are noted. EF unchanged. Grade 3 DD. Elevated pulmonary pressures. FINDINGS  Left Ventricle: Left ventricular ejection fraction, by estimation, is 60 to 65%. The left ventricle has normal function. The left ventricle has no regional wall motion abnormalities. The left ventricular internal cavity size was normal in size. There is  no left ventricular hypertrophy. Left ventricular diastolic parameters are consistent with Grade III diastolic dysfunction (restrictive). Elevated left ventricular end-diastolic pressure. The E/e' is 33.5. Right Ventricle: The right ventricular size is normal. No increase in right ventricular wall  thickness. Right ventricular systolic function is normal. There is moderately elevated pulmonary artery systolic pressure. The tricuspid regurgitant velocity is 2.74 m/s, and with an assumed right atrial pressure of 15 mmHg, the estimated right ventricular systolic pressure is 84.6 mmHg. Left Atrium: Left atrial size was mild to moderately dilated. Right Atrium: Right atrial size was normal in size. Pericardium: A small pericardial effusion is present. The pericardial effusion is circumferential. Presence of epicardial fat layer. Mitral Valve: The mitral valve is grossly normal. Mild mitral valve regurgitation. No evidence of mitral valve stenosis. Tricuspid Valve: The tricuspid valve is grossly normal. Tricuspid valve regurgitation is mild . No evidence of tricuspid stenosis. Aortic Valve: The aortic valve is tricuspid. Aortic valve regurgitation is not visualized. No aortic stenosis is present. Pulmonic Valve: The pulmonic valve was grossly normal. Pulmonic valve regurgitation is mild. No evidence of pulmonic stenosis. Aorta: The aortic root and ascending aorta are structurally normal, with no evidence of dilitation. Venous: The inferior vena cava is dilated in size with less than 50% respiratory variability, suggesting right atrial pressure of 15  mmHg. IAS/Shunts: The atrial septum is grossly normal.  LEFT VENTRICLE PLAX 2D LVIDd:         4.90 cm   Diastology LVIDs:         2.70 cm   LV e' medial:    3.70 cm/s LV PW:         1.20 cm   LV E/e' medial:  33.5 LV IVS:        1.00 cm   LV e' lateral:   5.11 cm/s LVOT diam:     1.90 cm   LV E/e' lateral: 24.3 LV SV:         75 LV SV Index:   38 LVOT Area:     2.84 cm  RIGHT VENTRICLE RV S prime:     13.30 cm/s TAPSE (M-mode): 2.1 cm LEFT ATRIUM              Index        RIGHT ATRIUM           Index LA diam:        3.90 cm  1.97 cm/m   RA Area:     18.10 cm LA Vol (A2C):   102.0 ml 51.43 ml/m  RA Volume:   45.00 ml  22.69 ml/m LA Vol (A4C):   74.4 ml  37.51 ml/m  LA Biplane Vol: 91.1 ml  45.93 ml/m  AORTIC VALVE LVOT Vmax:   112.00 cm/s LVOT Vmean:  77.100 cm/s LVOT VTI:    0.265 m  AORTA Ao Root diam: 3.30 cm Ao Asc diam:  3.50 cm MITRAL VALVE                TRICUSPID VALVE MV Area (PHT): 4.04 cm     TR Peak grad:   30.0 mmHg MV Decel Time: 188 msec     TR Vmax:        274.00 cm/s MV E velocity: 124.00 cm/s MV A velocity: 56.70 cm/s   SHUNTS MV E/A ratio:  2.19         Systemic VTI:  0.26 m                             Systemic Diam: 1.90 cm Eleonore Chiquito MD Electronically signed by Eleonore Chiquito MD Signature Date/Time: 11/15/2022/9:51:01 AM    Final      LOS: 23 days   Antonieta Pert, MD [facility]  11/17/2022, 8:05 AM

## 2022-11-17 NOTE — Progress Notes (Signed)
Overall, I really do not see much improvement in her status.  She still short of breath.  She had an abdominal ultrasound yesterday.  This showed a little bit of ascites.  Again, I am not surprised that she has NASH.  We will get a chest x-ray on her today.  If the chest x-ray is no better or worse, then I think we are really going to have to talk to her as to what we need to do down the road.  Again, I still see that she is a candidate for systemic therapy for her metastatic hepatocellular carcinoma.  She had echocardiogram which did show some diastolic dysfunction.  I am not sure what can be done about this.  Her appetite is still not all that great.  I have her on Marinol.  I think her daughter is up some food.  Labs show white cell count 5.9.  Hemoglobin 10.  Platelet count is 98,000.  Her BUN is 74 creatinine 1.28.  Calcium 7.6.  She not complain of any pain.  I know that physical therapy is trying to help her.  I am not sure as to how much they can accomplish with her.  She has had no bleeding.  There is no diarrhea cough.  It is nonproductive.  She just might be too weak to expectorate any mucus.  She is getting nebulizers on schedule.    Radiation therapy is going well for her lower back.  Her vital signs show temperature of 97.7.  Pulse 53.  Blood pressure 140/44.  Still sound relatively good bilaterally.  She has no wheezing.  Cardiac exam regular rate and rhythm with occasional extra beat.  No distention.  Bowel sounds are present.  There is no fluid wave.  Extremities show clubbing, cyanosis or edema neurological exam focal.  Margaret Dyer has metastatic hepatocellular carcinoma.  She is getting radiation therapy right now.  The x-ray will be critical.  Quality of life is our goal.  [name], MD  Psalms 147:3.

## 2022-11-18 DIAGNOSIS — R52 Pain, unspecified: Secondary | ICD-10-CM | POA: Diagnosis not present

## 2022-11-18 LAB — COMPREHENSIVE METABOLIC PANEL
ALT: 20 U/L (ref 0–44)
AST: 32 U/L (ref 15–41)
Albumin: 3.3 g/dL — ABNORMAL LOW (ref 3.5–5.0)
Alkaline Phosphatase: 74 U/L (ref 38–126)
Anion gap: 8 (ref 5–15)
BUN: 78 mg/dL — ABNORMAL HIGH (ref 8–23)
CO2: 32 mmol/L (ref 22–32)
Calcium: 7.9 mg/dL — ABNORMAL LOW (ref 8.9–10.3)
Chloride: 100 mmol/L (ref 98–111)
Creatinine, Ser: 1.29 mg/dL — ABNORMAL HIGH (ref 0.44–1.00)
GFR, Estimated: 41 mL/min — ABNORMAL LOW (ref >60)
Glucose, Bld: 165 mg/dL — ABNORMAL HIGH (ref 70–99)
Potassium: 4.4 mmol/L (ref 3.5–5.1)
Sodium: 140 mmol/L (ref 135–145)
Total Bilirubin: 0.9 mg/dL (ref 0.3–1.2)
Total Protein: 5.9 g/dL — ABNORMAL LOW (ref 6.5–8.1)

## 2022-11-18 LAB — CBC
HCT: 33.9 % — ABNORMAL LOW (ref 36.0–46.0)
Hemoglobin: 10.5 g/dL — ABNORMAL LOW (ref 12.0–15.0)
MCH: 29.2 pg (ref 26.0–34.0)
MCHC: 31 g/dL (ref 30.0–36.0)
MCV: 94.2 fL (ref 80.0–100.0)
Platelets: 91 10*3/uL — ABNORMAL LOW (ref 150–400)
RBC: 3.6 MIL/uL — ABNORMAL LOW (ref 3.87–5.11)
RDW: 19.1 % — ABNORMAL HIGH (ref 11.5–15.5)
WBC: 7.5 10*3/uL (ref 4.0–10.5)
nRBC: 0 % (ref 0.0–0.2)

## 2022-11-18 LAB — GLUCOSE, CAPILLARY
Glucose-Capillary: 165 mg/dL — ABNORMAL HIGH (ref 70–99)
Glucose-Capillary: 196 mg/dL — ABNORMAL HIGH (ref 70–99)
Glucose-Capillary: 181 mg/dL — ABNORMAL HIGH (ref 70–99)
Glucose-Capillary: 261 mg/dL — ABNORMAL HIGH (ref 70–99)

## 2022-11-18 LAB — PREALBUMIN: Prealbumin: 19 mg/dL (ref 18–38)

## 2022-11-18 MED ORDER — SENNOSIDES-DOCUSATE SODIUM 8.6-50 MG PO TABS
2.0000 | ORAL_TABLET | Freq: Two times a day (BID) | ORAL | Status: DC
  Administered 2022-11-18 – 2022-11-20 (×5): 2 via ORAL
  Filled 2022-11-18 (×6): qty 2

## 2022-11-18 MED ORDER — POLYETHYLENE GLYCOL 3350 17 G PO PACK
17.0000 g | PACK | Freq: Two times a day (BID) | ORAL | Status: DC
  Administered 2022-11-18 – 2022-11-20 (×5): 17 g via ORAL
  Filled 2022-11-18 (×7): qty 1

## 2022-11-18 NOTE — Progress Notes (Signed)
Pt's daughter [name] called and expressed some concerns; she asked about miralax, nursing explained that medication had been ordered and changed from prn daily to b.I.d, also expressed concern w/ pts appetite, pt is currently taking marinol for appetite and nursing informed family that she could continue to bring in smoothies for pt, just to watch the ingredients she adds due to pt's renal function, pt resting in bed w/ eyes closed, call bell in reach, will continue to monitor

## 2022-11-18 NOTE — Progress Notes (Signed)
Overall, Margaret Dyer is about the same.  Her primary concern right now is that she is constipated.  She has some rectal pain.  Will have to see about getting her to have a bowel movement.  She had a chest x-ray yesterday.  I do not think this was any worse.  She just has atelectasis.  She really needs to get up and sit in a chair and walk around if possible.  I know she is using the incentive spirometer.  She is doing okay with her radiation.  She still has little bit of a cough.  She is still on oxygen.  She has had no bleeding.  There is been no fever.  It is hard to say what she is eating.  I know that a daughter comes up and helps bring her food.  She has no mouth sores.  There is no thrush.  We have her on Diflucan.  I think she has having a good urine output production.  Hopefully, this will help with her lungs.  Vital signs show temperature of 97.9.  Pulse 64.  Blood pressure 137/63.  Oral exam does not show any mucositis.  There is no thrush.  Lungs still sound relatively clear bilaterally.  She has good air movement bilaterally.  Cardiac exam regular rate and rhythm.  She has no murmurs.  Abdomen is soft.  There is no fluid wave.  There is no guarding or rebound tenderness.  Extremity shows no clubbing, cyanosis or edema.  Margaret Dyer has the metastatic hepatocellular carcinoma.  She currently is getting radiation to the L4 fracture.  The real problem is that she just is not active.  She just has a very low energy level.  Again, at 87 years old, with metastatic cancer, I understand this.  However, this is really affecting her lungs.  I think that a lot of the x-ray findings are probably atelectasis.  She just is not able to sit up in the chair a lot or walk around to help open her lungs up.  I know she is trying hard.  We will have to see what her labs look like.  I do appreciate the incredible care she is getting from the staff up on 6 E.  [name], MD  Lurena Joiner 1:30-33

## 2022-11-18 NOTE — Progress Notes (Addendum)
PROGRESS NOTE Margaret Dyer  NTI:[id number] DOB: 1935-03-06 DOA: 10/16/2022 PCP: Ann Held, DO   Brief Narrative/Hospital Course: 87 year old with history of DM2, HTN, HLD, diastolic CHF, pulmonary HTN, cirrhosis, CKD stage IIIa, renal cell carcinoma with left nephrectomy in 2000, hypothyroidism presented to hospital with intractable back pain currently admitted with working diagnosis of metastatic hepatic cellular carcinoma, respiratory failure.  Her CT scan showed compression fracture of L4 with innumerable bilateral pulmonary metastases, posterior left acetabular wall fracture.  MRI confirmed these findings and patient was admitted to the hospital.  Radiology and oncology were consulted.  IR procedure for kyphoplasty or biopsy was postponed as patient was unable to lay flat.  Patient was transferred to [facility] for radiation treatments.  Since being here patient is getting pain control, Chemo-Port placed on 12/8. Hospital course complicated by hyperkalemia.  Due to some respiratory distress, CT chest was done which showed signs of volume overload, some reduction in pulmonary nodules.  Her respiratory status,  hydration and renal function has been very tenuous therefore off-and-on requiring IV fluids and diuretics.  12/13: Ultrasound shows small volume ascites, patient continues to remain short of breath placed on scheduled Lasix iv   Subjective: Seen and examined this morning.  She reports she feels about the same.  Complains of constipation. Daily meal intake has been 75 to 100% Overnight afebrile, on 3 to nasal cannula BP stable Labs this morning with stable renal function and CBC with mild thrombocytopenia/anemia Uop 1600 cc.  Assessment and Plan:  Compression fracture L4 with concern for pathological fracture and metastasis Intractable back pain from above: Had extensive imaging with MRI, CT of the pelvis.Has 30% vertebral height loss L4.Kyphoplasty and biopsy on hold as  patient unable to tolerate due to pain and discomfort. Oncology following s/p chemo-Port placement, xrt started 12/8 through 12/27 for total 10 treatment.  Pain is controlled at this time with Tramadol, fentanyl patch, lidocaine patch tylenol  and Decadron. Cont as per  oncology. Cont PTOT.  Innumerable b/l pulmonary metastasis History of renal cell CA with left nephrectomy CT scan obtained on admission showed innumerable bilateral pulmonary metastases as well as possible liver mets and bone mets.Oncology suspecting metastatic hepatocellular carcinoma due to elevated AFP.  continue XRT fpr total 10 treatments.S/P liver biopsy-showed cirrhosis but no malignant disease  Acute on chronic hypoxic respiratory failure Shortness of breath ongoing: Grade 3 Diastolic dysfunction suspect acute on chronic disease/CHF Anasarca with leg edema/bilateral pleural effusion Cirrhosis>liver biopsy showed Cirrhosis (stage 4 of 4) of uncertain etiology, mild active steatohepatitis Small volume ascites on ultrasound 12/14:  Chronically complaining of shortness of breath.  Leg edematous.  Had weight gain 189lb on admission. This is Multifactor with CHF COPD pneumonia anasarca deconditioning debility.  CT chest 12/9: Moderate bilateral pleural effusion, pulmonary nodules, small volume ascites, cirrhosis, cardiomegaly,  BNP was 879,Procalcitonin 0.5.Echo  12/13>EF 60 to 65%, G3 DD restrictive, moderately elevated PASP.  S/p IV Lasix and albumin 12/13.  Continue on IV Lasix as below with monitoring of renal function. Monitor intake output Daily weight. Her weight was 189.1 lb on 11/08/2022> now at 228>212 lb> trending down as belowNet IO Since Admission: 4,694.21 mL [11/18/22 0814]  Filed Weights   11/15/22 0635 11/16/22 1200 11/17/22 0500  Weight: 96.3 kg 97.8 kg 96 kg   COPD: on home O2 3.5 l> here needing 3-4l [place].continue bronchodilators scheduled and as needed, continue diuretics as above.  Continue supplemental  oxygen.  Hyperkalemia: K improved discontinued Lokelma  12/15 Recent Labs  Lab 11/14/22 0500 11/15/22 0245 11/16/22 0530 11/17/22 0555 11/18/22 0555  K 5.3* 5.2* 4.7 4.4 4.4     Hypocalcemia stable Recent Labs  Lab 11/14/22 0500 11/15/22 0245 11/16/22 0530 11/17/22 0555 11/18/22 0555  CALCIUM 7.0* 7.4* 7.5* 7.6* 7.9*     CKD stage IV, history of left nephrectomy: Baseline creatinine close to 1.7 currently improved to 1.2, tolerating lasix, monitor. Recent Labs    11/09/22 0639 11/10/22 0621 11/11/22 0729 11/12/22 0500 11/13/22 0523 11/14/22 0500 11/15/22 0245 11/16/22 0530 11/17/22 0555 11/18/22 0555  BUN 70* 76* 87* 90* 72* 84* 71* 71* 74* 78*  CREATININE 2.09* 1.90* 2.02* 1.80* 1.65* 1.44* 1.41* 1.41* 1.28* 1.29*    Pneumonia: completed meropenem 12/1-12/5  Chronic anxiety disorder on ativan: daughter requesting to schedule her Ativan to 0.5 mg nighttime   Posterior left acetabular wall fracture of indeterminate age: Noted on the CT of the hip with a small fracture 11/22, seen by [facility], recommended PWB with assistance of a walker, Follow in office as outpt 3 weeks for repeat xrays   Klebsiella UTI: Completed antibiotics  T2DM with intermittent hyperglycemia due to steroids: Blood sugar stable as below, continue SSI Semglee 9 units bedtime and 2 units Premeal  Recent Labs  Lab 11/17/22 0735 11/17/22 1125 11/17/22 1609 11/17/22 2156 11/18/22 0750  GLUCAP 121* 79 221* 248* 165*    Hypertension:BP stable cont on coreg hydralazine Demadex  HLD stable continue Zetia and Zocor  Hypothyroidism continue Synthroid    Thrombocytopenia, chronic:Does has ecchymosis,but no overt bleeding.hematology monitoring.   Recent Labs  Lab 11/14/22 0500 11/15/22 0245 11/16/22 0530 11/17/22 0555 11/18/22 0555  PLT 115* 118* 108* 98* 91*     Failure to thrive Goals of care: Appears debilitated, deconditioned, prognosis not bright in the setting of  cancer continue palliative care, dietitian input supportive care .  She is DNI status.  Increased nutrition need continue dronabinol, augment and nutrition supplement as below Nutrition Problem: Increased nutrient needs Etiology: acute illness, chronic illness Signs/Symptoms: estimated needs Interventions: Ensure Enlive (each supplement provides 350kcal and 20 grams of protein), Refer to RD note for recommendations   Class II Obesity:Patient's Body mass index is 37.49 kg/m. : Will benefit with PCP follow-up, weight loss  healthy lifestyle and outpatient sleep evaluation.  DVT prophylaxis: heparin injection 5,000 Units Start: 10/25/22 1400 SCDs Start: 10/25/22 0220 Code Status:   Code Status: Partial Code Family Communication: plan of care discussed with patient at bedside.  I had updated patient's daughter at the bedside and on the phone previously.   Patient status is: Inpatient because of ongoing need for radiation therapy inpatient Level of care: Telemetry  Dispo: The patient is from: Lives at daughter's home            Anticipated disposition: SNF oncology after completion of radiation Objective: Vitals last 24 hrs: Vitals:   11/17/22 2007 11/17/22 2120 11/18/22 0525 11/18/22 0732  BP: (!) 137/58  137/63 (!) 127/94  Pulse: 66  64 (!) 57  Resp: _0 Temp: 97.9 F (36.6 C)  97.9 F (36.6 C) 98.9 F (37.2 C)  TempSrc: Oral  Oral Oral  SpO2: 96% 98% 97% 99%  Weight:      Height:       Weight change:   Physical Examination: General exam: AA,elderly frail weak,older appearing HEENT:Oral mucosa moist, Ear/Nose WNL grossly, dentition normal. Respiratory system: bilaterally diminished BS,no use of accessory muscle Cardiovascular system: S1 &  S2 +, regular rate, JVD neg. Gastrointestinal system: Abdomen soft, NT,ND,BS+ Nervous System:Alert, awake, moving extremities and grossly nonfocal Extremities: LE ankle edema mild, lower extremities warm Skin: No rashes,no  icterus. MSK: Normal muscle bulk,tone, power   Medications reviewed:  Scheduled Meds:  albuterol  2.5 mg Nebulization TID   carvedilol  12.5 mg Oral BID WC   Chlorhexidine Gluconate Cloth  6 each Topical Daily   dexamethasone  4 mg Oral Q12H   docusate sodium  100 mg Oral BID   dronabinol  2.5 mg Oral QAC lunch   feeding supplement  237 mL Oral TID BM   fentaNYL  1 patch Transdermal Q72H   fluconazole  100 mg Oral Daily   furosemide  40 mg Intravenous Daily   heparin  5,000 Units Subcutaneous Q8H   hydrALAZINE  50 mg Oral Q8H   insulin aspart  0-5 Units Subcutaneous QHS   insulin aspart  0-9 Units Subcutaneous TID WC   insulin aspart  2 Units Subcutaneous TID WC   insulin glargine-yfgn  9 Units Subcutaneous QHS   levothyroxine  25 mcg Oral Q MTWThF   lidocaine  1 patch Transdermal QHS   LORazepam  0.5 mg Oral QHS   metolazone  5 mg Oral Q24H   multivitamin with minerals  1 tablet Oral Daily   oxybutynin  5 mg Oral Daily   polyethylene glycol  17 g Oral BID   senna-docusate  2 tablet Oral BID   Continuous Infusions:  sodium chloride        Diet Order             Diet regular Room service appropriate? Yes; Fluid consistency: Thin  Diet effective now                    Nutrition Problem: Increased nutrient needs Etiology: acute illness, chronic illness Signs/Symptoms: estimated needs Interventions: Ensure Enlive (each supplement provides 350kcal and 20 grams of protein), Refer to RD note for recommendations   Intake/Output Summary (Last 24 hours) at 11/18/2022 0814 Last data filed at 11/18/2022 0320 Gross per 24 hour  Intake 480 ml  Output 1600 ml  Net -1120 ml    Net IO Since Admission: 4,694.21 mL [11/18/22 0814]  Wt Readings from Last 3 Encounters:  11/17/22 96 kg  10/20/22 82.9 kg  09/04/22 85.8 kg     Unresulted Labs (From admission, onward)     Start     Ordered   11/18/22 0650  Prealbumin  Once,   R        11/18/22 0650   11/18/22 0500   Comprehensive metabolic panel  Daily,   R     Question:  Specimen collection method  Answer:  Unit=Unit collect   11/17/22 0700   11/16/22 0500  CBC  Daily,   R     Question:  Specimen collection method  Answer:  Lab=Lab collect   11/15/22 0756          Data Reviewed: I have personally reviewed following labs and imaging studies CBC: Recent Labs  Lab 11/14/22 0500 11/15/22 0245 11/16/22 0530 11/17/22 0555 11/18/22 0555  WBC 5.4 5.5 5.3 5.9 7.5  HGB 10.7* 10.3* 9.8* 10.5* 10.5*  HCT 34.6* 33.9* 31.9* 34.2* 33.9*  MCV 95.1 95.5 95.5 94.5 94.2  PLT 115* 118* 108* 98* 91*    Basic Metabolic Panel: Recent Labs  Lab 11/12/22 0500 11/13/22 0523 11/14/22 0500 11/15/22 0245 11/16/22 0530 11/17/22 0555 11/18/22 0555  NA 140 139 139 140 142 140 140  K 5.8* 5.7* 5.3* 5.2* 4.7 4.4 4.4  CL 110 109 107 108 106 101 100  CO2 _0 32  GLUCOSE 164* 163* 194* 170* 157* 127* 165*  BUN 90* 72* 84* 71* 71* 74* 78*  CREATININE 1.80* 1.65* 1.44* 1.41* 1.41* 1.28* 1.29*  CALCIUM 6.6* 6.6* 7.0* 7.4* 7.5* 7.6* 7.9*  MG 3.5* 3.6* 3.3* 2.8*  --   --   --     GFR: Estimated Creatinine Clearance: 35.1 mL/min (A) (by C-G formula based on SCr of 1.29 mg/dL (H)). Liver Function Tests: Recent Labs  Lab 11/18/22 0555  AST 32  ALT 20  ALKPHOS 74  BILITOT 0.9  PROT 5.9*  ALBUMIN 3.3*    No results found for this or any previous visit (from the past 240 hour(s)).   Antimicrobials: Anti-infectives (From admission, onward)    Start     Dose/Rate Route Frequency Ordered Stop   11/16/22 1000  fluconazole (DIFLUCAN) tablet 100 mg        100 mg Oral Daily 11/16/22 0658     11/13/22 1000  azithromycin (ZITHROMAX) tablet 500 mg  Status:  Discontinued        500 mg Oral Daily 11/13/22 0732 11/16/22 0722   11/11/22 0800  azithromycin (ZITHROMAX) 500 mg in sodium chloride 0.9 % 250 mL IVPB  Status:  Discontinued        500 mg 250 mL/hr over 60 Minutes Intravenous Every 24 hours  11/11/22 0717 11/13/22 0732   11/28/2022 0800  meropenem (MERREM) 500 mg in sodium chloride 0.9 % 100 mL IVPB        500 mg 200 mL/hr over 30 Minutes Intravenous 2 times daily 11/04/2022 0637 11/07/22 2316   11/02/22 0800  vancomycin (VANCOCIN) IVPB 1000 mg/200 mL premix        1,000 mg 200 mL/hr over 60 Minutes Intravenous To Radiology 11/02/22 0710 11/13/2022 0800   10/25/22 0715  cefTRIAXone (ROCEPHIN) 1 g in sodium chloride 0.9 % 100 mL IVPB  Status:  Discontinued        1 g 200 mL/hr over 30 Minutes Intravenous Every 24 hours 10/25/22 0701 10/27/22 1515      Culture/Microbiology    Component Value Date/Time   SDES URINE, CLEAN CATCH 10/28/2022 0208   SPECREQUEST NONE 10/28/2022 0208   CULT  10/28/2022 0208    NO GROWTH Performed at [facility], [place] [address], [place], [place] [address]    REPTSTATUS 10/29/2022 FINAL 10/28/2022 6067   Radiology Studies: DG Chest 2 View  Result Date: 11/17/2022 CLINICAL DATA:  Productive cough.  History of metastatic malignancy EXAM: CHEST - 2 VIEW COMPARISON:  Chest radiograph dated 11/14/2022, CT chest dated 11/11/2022 FINDINGS: Right chest wall port tip projects over the superior cavoatrial junction. Low lung volumes. Bibasilar patchy opacities. Multifocal pulmonary nodules are better evaluated on prior chest CT. Small bilateral pleural effusions. No pneumothorax. Similar enlarged cardiomediastinal silhouette. The visualized skeletal structures are unremarkable. Left upper quadrant surgical clips. IMPRESSION: 1. Low lung volumes with bibasilar patchy opacities, which may represent atelectasis, aspiration or pneumonia. 2. Small bilateral pleural effusions. 3. Multifocal pulmonary nodules are better evaluated on prior chest CT. Electronically Signed   By: Darrin Nipper M.D.   On: 11/17/2022 12:07   [place] ASCITES (ABDOMEN LIMITED)  Result Date: 11/16/2022 CLINICAL DATA:  Decompensated hepatic cirrhosis EXAM: LIMITED ABDOMEN ULTRASOUND FOR ASCITES  TECHNIQUE: Limited ultrasound survey  for ascites was performed in all four abdominal quadrants. COMPARISON:  None Available. FINDINGS: Small volume ascites in the right upper quadrant. Partially visualized liver demonstrates nodular contours consistent with history of cirrhosis. IMPRESSION: Small volume ascites in the right upper quadrant. Electronically Signed   By: Maurine Simmering M.D.   On: 11/16/2022 09:30     LOS: 24 days   Antonieta Pert, MD [facility]  11/18/2022, 8:14 AM

## 2022-11-19 DIAGNOSIS — R52 Pain, unspecified: Secondary | ICD-10-CM | POA: Diagnosis not present

## 2022-11-19 LAB — CBC
HCT: 33.7 % — ABNORMAL LOW (ref 36.0–46.0)
Hemoglobin: 10.4 g/dL — ABNORMAL LOW (ref 12.0–15.0)
MCH: 29.8 pg (ref 26.0–34.0)
MCHC: 30.9 g/dL (ref 30.0–36.0)
MCV: 96.6 fL (ref 80.0–100.0)
Platelets: 81 10*3/uL — ABNORMAL LOW (ref 150–400)
RBC: 3.49 MIL/uL — ABNORMAL LOW (ref 3.87–5.11)
RDW: 19.1 % — ABNORMAL HIGH (ref 11.5–15.5)
WBC: 7.7 10*3/uL (ref 4.0–10.5)
nRBC: 0.3 % — ABNORMAL HIGH (ref 0.0–0.2)

## 2022-11-19 LAB — COMPREHENSIVE METABOLIC PANEL
ALT: 19 U/L (ref 0–44)
AST: 30 U/L (ref 15–41)
Albumin: 3 g/dL — ABNORMAL LOW (ref 3.5–5.0)
Alkaline Phosphatase: 68 U/L (ref 38–126)
Anion gap: 9 (ref 5–15)
BUN: 74 mg/dL — ABNORMAL HIGH (ref 8–23)
CO2: 32 mmol/L (ref 22–32)
Calcium: 7.7 mg/dL — ABNORMAL LOW (ref 8.9–10.3)
Chloride: 98 mmol/L (ref 98–111)
Creatinine, Ser: 1.22 mg/dL — ABNORMAL HIGH (ref 0.44–1.00)
GFR, Estimated: 43 mL/min — ABNORMAL LOW (ref >60)
Glucose, Bld: 174 mg/dL — ABNORMAL HIGH (ref 70–99)
Potassium: 4.8 mmol/L (ref 3.5–5.1)
Sodium: 139 mmol/L (ref 135–145)
Total Bilirubin: 0.8 mg/dL (ref 0.3–1.2)
Total Protein: 5.6 g/dL — ABNORMAL LOW (ref 6.5–8.1)

## 2022-11-19 LAB — GLUCOSE, CAPILLARY
Glucose-Capillary: 166 mg/dL — ABNORMAL HIGH (ref 70–99)
Glucose-Capillary: 116 mg/dL — ABNORMAL HIGH (ref 70–99)
Glucose-Capillary: 224 mg/dL — ABNORMAL HIGH (ref 70–99)
Glucose-Capillary: 145 mg/dL — ABNORMAL HIGH (ref 70–99)

## 2022-11-19 MED ORDER — HYDROMORPHONE HCL 2 MG PO TABS
1.0000 mg | ORAL_TABLET | Freq: Four times a day (QID) | ORAL | Status: DC | PRN
  Administered 2022-11-19 – 2022-11-20 (×2): 1 mg via ORAL
  Filled 2022-11-19 (×2): qty 1

## 2022-11-19 MED ORDER — GERHARDT'S BUTT CREAM
TOPICAL_CREAM | Freq: Every day | CUTANEOUS | Status: DC | PRN
  Filled 2022-11-19 (×2): qty 1

## 2022-11-19 MED ORDER — HYDROCORTISONE 1 % EX CREA
TOPICAL_CREAM | Freq: Two times a day (BID) | CUTANEOUS | Status: AC
  Filled 2022-11-19: qty 28

## 2022-11-19 MED ORDER — ORAL CARE MOUTH RINSE: 15.0000 mL | OROMUCOSAL | Status: DC | PRN

## 2022-11-19 NOTE — Progress Notes (Signed)
PROGRESS NOTE Margaret Dyer  DUK:[id number] DOB: 1935-10-09 DOA: 10/09/2022 PCP: Ann Held, DO   Brief Narrative/Hospital Course: 87 year old with history of DM2, HTN, HLD, diastolic CHF, pulmonary HTN, cirrhosis, CKD stage IIIa, renal cell carcinoma with left nephrectomy in 2000, hypothyroidism presented to hospital with intractable back pain currently admitted with working diagnosis of metastatic hepatic cellular carcinoma, respiratory failure.  Her CT scan showed compression fracture of L4 with innumerable bilateral pulmonary metastases, posterior left acetabular wall fracture.  MRI confirmed these findings and patient was admitted to the hospital.  Radiology and oncology were consulted.  IR procedure for kyphoplasty or biopsy was postponed as patient was unable to lay flat.  Patient was transferred to [facility] for radiation treatments.  Since being here patient is getting pain control, Chemo-Port placed on 12/8. Hospital course complicated by hyperkalemia.  Due to some respiratory distress, CT chest was done which showed signs of volume overload, some reduction in pulmonary nodules.  Her respiratory status,  hydration and renal function has been very tenuous therefore off-and-on requiring IV fluids and diuretics.  12/13: Ultrasound shows small volume ascites, patient continues to remain short of breath placed on scheduled Lasix iv   Subjective: Seen and examined this morning.  Resting comfortably on the bedside chair Complains of impaction of stool/soreness on the bottom nurse reports she has had multiple bowel movements. Overnight no fever.  BP stable in 120s, saturating 95% on 3 L [place].  Labs with a stable renal function 1.2 BUN elevated 74 Weight at 212.3 lb/96.3KG of note wt was 228 lb on 12/12  Assessment and Plan:  Metastatic hepatocellular carcinoma Pathological L4 fracture w/ concern for metastasis Intractable back pain from l4 fracture Innumerable pulmonary  metastasis History of renal cell carcinoma with left nephrectomy: Evaluated by oncology Dr. Marin Olp on board with extensive workup including:MRI, CT of the pelvis, w/ 30% vertebral height loss L4.Kyphoplasty and biopsy on hold as patient unable to tolerate due to pain and discomfort. Oncology suspecting metastatic hepatocellular carcinoma due to elevated AFP. S/P liver biopsy-showed cirrhosis but no malignant disease. s/p chemo-Port placement, xrt started 12/8 and to cont through 12/27 for total 10 treatment.  Pain is controlled at this time with Tramadol, fentanyl patch, lidocaine patch tylenol  and Decadron.  Daughter requesting Dilaudid I will add low-dose PRN. Cont as per  oncology. Cont PTOT.  Constipation: Continue MiraLAX stool softener.  Nursing to examine and clean her today.  Acute on chronic hypoxic respiratory failure Shortness of breath ongoing Grade 3 Diastolic dysfunction suspect acute on chronic disease/CHF Anasarca with leg edema/bilateral pleural effusion Cirrhosis>liver biopsy showed Cirrhosis (stage 4 of 4) of uncertain etiology, mild active steatohepatitis Small volume ascites on ultrasound 12/14 Bilateral pleural effusions:  Chronically complaining of shortness of breath.Leg edematous.  She has had weight gains see below.  Suspected to be multifactorial from CHF COPD pneumonia anasarca coupled w/ deconditioning/debility and metastatic disease.CT chest 12/9: Moderate bilateral pleural effusion, pulmonary nodules, small volume ascites, cirrhosis, cardiomegaly,  BNP was 879,Procalcitonin 0.5.Echo  12/13>EF 60 to 65%, G3 DD restrictive, moderately elevated PASP.  Currently diuresing with IV Lasix 40 mg daily> renal function tolerating.  Continue aggressive bronchodilators, PTOT as tolerated.Monitor intake output Daily weight. Net IO Since Admission: 3,234.21 mL [11/19/22 0850]  Filed Weights   11/16/22 1200 11/17/22 0500 11/19/22 0335  Weight: 97.8 kg 96 kg 96.3 kg   COPD:on home  O2 3.5 l> here needing 3-4l [place].continue bronchodilators scheduled and as needed, cont supplemental  oxygen.  Hyperkalemia: Potassium improved although on higher side.  Lokelma discontinued 12/15.  Recent Labs  Lab 11/15/22 0245 11/16/22 0530 11/17/22 0555 11/18/22 0555 11/19/22 0522  K 5.2* 4.7 4.4 4.4 4.8   Hypocalcemia stable, monitor Recent Labs  Lab 11/15/22 0245 11/16/22 0530 11/17/22 0555 11/18/22 0555 11/19/22 0522  CALCIUM 7.4* 7.5* 7.6* 7.9* 7.7*     CKD stage IV, history of left nephrectomy: Baseline creatinine close to 1.7 currently improved to 1.2 and  tolerating lasix> continue to monitor while being diuresed Recent Labs    11/10/22 0621 11/11/22 0729 11/12/22 0500 11/13/22 0523 11/14/22 0500 11/15/22 0245 11/16/22 0530 11/17/22 0555 11/18/22 0555 11/19/22 0522  BUN 76* 87* 90* 72* 84* 71* 71* 74* 78* 74*  CREATININE 1.90* 2.02* 1.80* 1.65* 1.44* 1.41* 1.41* 1.28* 1.29* 1.22*    Pneumonia: completed meropenem 12/1-12/5  Chronic anxiety disorder on ativan: daughter requesting to schedule her Ativan to 0.5 mg nighttime   Posterior left acetabular wall fracture of indeterminate age: Noted on the CT of the hip with a small fracture 11/22, seen by [facility], recommended PWB with assistance of a walker, Follow in office as outpt 3 weeks for repeat xrays   Klebsiella UTI: Completed antibiotics  T2DM with intermittent hyperglycemia due to steroids: Blood sugar stable as below, continue SSI Semglee 9 units bedtime and 2 units Premeal  Recent Labs  Lab 11/18/22 0750 11/18/22 1212 11/18/22 1614 11/18/22 2128 11/19/22 0743  GLUCAP 165* 196* 181* 261* 166*    Hypertension:BP stable cont on coreg hydralazine, diuresis HLD stable continue Zetia and Zocor Hypothyroidism continue Synthroid    Thrombocytopenia, chronic:Does has ecchymosis,but no overt bleeding.hematology monitoring.   Recent Labs  Lab 11/15/22 0245 11/16/22 0530 11/17/22 0555  11/18/22 0555 11/19/22 0522  PLT 118* 108* 98* 91* 81*     Failure to thrive Goals of care: Appears debilitated, deconditioned, prognosis not bright in the setting of cancer continue palliative care, dietitian input supportive care .  She is DNI status.  Increased nutrition need continue dronabinol, augment and nutrition supplement as below Nutrition Problem: Increased nutrient needs Etiology: acute illness, chronic illness Signs/Symptoms: estimated needs Interventions: Ensure Enlive (each supplement provides 350kcal and 20 grams of protein), Refer to RD note for recommendations   Class II Obesity:Patient's Body mass index is 37.61 kg/m. : Will benefit with PCP follow-up, weight loss  healthy lifestyle and outpatient sleep evaluation.  DVT prophylaxis: heparin injection 5,000 Units Start: 10/25/22 1400 SCDs Start: 10/25/22 0220 Code Status:   Code Status: Partial Code Family Communication: plan of care discussed with patient at bedside.  I had updated patient's daughter at the bedside and on the phone previously.   Patient status is: Inpatient because of ongoing need for radiation therapy inpatient Level of care: Telemetry  Dispo: The patient is from: Lives at daughter's home            Anticipated disposition: SNF once cleared by oncology after completion of radiation Objective: Vitals last 24 hrs: Vitals:   11/19/22 0335 11/19/22 0600 11/19/22 0612 11/19/22 0700  BP:   (!) 132/50   Pulse:   64   Resp:  (!) _0 Temp:   98.4 F (36.9 C)   TempSrc:   Oral   SpO2:   95%   Weight: 96.3 kg     Height:       Weight change:   Physical Examination: General exam: AAOX3, weak,older appearing HEENT:Oral mucosa moist, Ear/Nose WNL grossly, dentition  normal. Respiratory system: bilaterally diminished BS,no use of accessory muscle Cardiovascular system: S1 & S2 +, regular rate, JVD NEG. Gastrointestinal system: Abdomen soft,NT,ND,BS+ Nervous System:Alert, awake, moving  extremities and grossly nonfocal Extremities: LE ankle edema MILD, lower extremities warm Skin:No rashes,no icterus. LKT:GYBWLS muscle bulk,tone, power    Medications reviewed:  Scheduled Meds:  albuterol  2.5 mg Nebulization TID   carvedilol  12.5 mg Oral BID WC   Chlorhexidine Gluconate Cloth  6 each Topical Daily   dexamethasone  4 mg Oral Q12H   docusate sodium  100 mg Oral BID   dronabinol  2.5 mg Oral QAC lunch   feeding supplement  237 mL Oral TID BM   fentaNYL  1 patch Transdermal Q72H   fluconazole  100 mg Oral Daily   furosemide  40 mg Intravenous Daily   heparin  5,000 Units Subcutaneous Q8H   hydrALAZINE  50 mg Oral Q8H   insulin aspart  0-5 Units Subcutaneous QHS   insulin aspart  0-9 Units Subcutaneous TID WC   insulin aspart  2 Units Subcutaneous TID WC   insulin glargine-yfgn  9 Units Subcutaneous QHS   levothyroxine  25 mcg Oral Q MTWThF   lidocaine  1 patch Transdermal QHS   LORazepam  0.5 mg Oral QHS   metolazone  5 mg Oral Q24H   multivitamin with minerals  1 tablet Oral Daily   oxybutynin  5 mg Oral Daily   polyethylene glycol  17 g Oral BID   senna-docusate  2 tablet Oral BID  Continuous Infusions:  sodium chloride      Diet Order             Diet regular Room service appropriate? Yes; Fluid consistency: Thin  Diet effective now                  Net IO Since Admission: 3,234.21 mL [11/19/22 0850]  Wt Readings from Last 3 Encounters:  11/19/22 96.3 kg  10/20/22 82.9 kg  09/04/22 85.8 kg    Unresulted Labs (From admission, onward)     Start     Ordered   11/18/22 0500  Comprehensive metabolic panel  Daily,   R     Question:  Specimen collection method  Answer:  Unit=Unit collect   11/17/22 0700   11/16/22 0500  CBC  Daily,   R     Question:  Specimen collection method  Answer:  Lab=Lab collect   11/15/22 0756          Data Reviewed: I have personally reviewed following labs and imaging studies CBC: Recent Labs  Lab 11/15/22 0245  11/16/22 0530 11/17/22 0555 11/18/22 0555 11/19/22 0522  WBC 5.5 5.3 5.9 7.5 7.7  HGB 10.3* 9.8* 10.5* 10.5* 10.4*  HCT 33.9* 31.9* 34.2* 33.9* 33.7*  MCV 95.5 95.5 94.5 94.2 96.6  PLT 118* 108* 98* 91* 81*    Basic Metabolic Panel: Recent Labs  Lab 11/13/22 0523 11/14/22 0500 11/15/22 0245 11/16/22 0530 11/17/22 0555 11/18/22 0555 11/19/22 0522  NA 139 139 140 142 140 140 139  K 5.7* 5.3* 5.2* 4.7 4.4 4.4 4.8  CL 109 107 108 106 101 100 98  CO2 _0 32 32  GLUCOSE 163* 194* 170* 157* 127* 165* 174*  BUN 72* 84* 71* 71* 74* 78* 74*  CREATININE 1.65* 1.44* 1.41* 1.41* 1.28* 1.29* 1.22*  CALCIUM 6.6* 7.0* 7.4* 7.5* 7.6* 7.9* 7.7*  MG 3.6* 3.3* 2.8*  --   --   --   --  GFR: Estimated Creatinine Clearance: 37.3 mL/min (A) (by C-G formula based on SCr of 1.22 mg/dL (H)). Liver Function Tests: Recent Labs  Lab 11/18/22 0555 11/19/22 0522  AST 32 30  ALT 20 19  ALKPHOS 74 68  BILITOT 0.9 0.8  PROT 5.9* 5.6*  ALBUMIN 3.3* 3.0*   No results found for this or any previous visit (from the past 240 hour(s)).  Antimicrobials: Anti-infectives (From admission, onward)    Start     Dose/Rate Route Frequency Ordered Stop   11/16/22 1000  fluconazole (DIFLUCAN) tablet 100 mg        100 mg Oral Daily 11/16/22 0658     11/13/22 1000  azithromycin (ZITHROMAX) tablet 500 mg  Status:  Discontinued        500 mg Oral Daily 11/13/22 0732 11/16/22 0722   11/11/22 0800  azithromycin (ZITHROMAX) 500 mg in sodium chloride 0.9 % 250 mL IVPB  Status:  Discontinued        500 mg 250 mL/hr over 60 Minutes Intravenous Every 24 hours 11/11/22 0717 11/13/22 0732   11/15/2022 0800  meropenem (MERREM) 500 mg in sodium chloride 0.9 % 100 mL IVPB        500 mg 200 mL/hr over 30 Minutes Intravenous 2 times daily 11/29/2022 0637 11/07/22 2316   11/02/22 0800  vancomycin (VANCOCIN) IVPB 1000 mg/200 mL premix        1,000 mg 200 mL/hr over 60 Minutes Intravenous To Radiology 11/02/22 0710  11/26/2022 0800   10/25/22 0715  cefTRIAXone (ROCEPHIN) 1 g in sodium chloride 0.9 % 100 mL IVPB  Status:  Discontinued        1 g 200 mL/hr over 30 Minutes Intravenous Every 24 hours 10/25/22 0701 10/27/22 1515      Culture/Microbiology    Component Value Date/Time   SDES URINE, CLEAN CATCH 10/28/2022 0208   SPECREQUEST NONE 10/28/2022 0208   CULT  10/28/2022 0208    NO GROWTH Performed at [facility], [place] [address], [place], [place] [address]    REPTSTATUS 10/29/2022 FINAL 10/28/2022 2841   Radiology Studies: DG Chest 2 View  Result Date: 11/17/2022 CLINICAL DATA:  Productive cough.  History of metastatic malignancy EXAM: CHEST - 2 VIEW COMPARISON:  Chest radiograph dated 11/14/2022, CT chest dated 11/11/2022 FINDINGS: Right chest wall port tip projects over the superior cavoatrial junction. Low lung volumes. Bibasilar patchy opacities. Multifocal pulmonary nodules are better evaluated on prior chest CT. Small bilateral pleural effusions. No pneumothorax. Similar enlarged cardiomediastinal silhouette. The visualized skeletal structures are unremarkable. Left upper quadrant surgical clips. IMPRESSION: 1. Low lung volumes with bibasilar patchy opacities, which may represent atelectasis, aspiration or pneumonia. 2. Small bilateral pleural effusions. 3. Multifocal pulmonary nodules are better evaluated on prior chest CT. Electronically Signed   By: Darrin Nipper M.D.   On: 11/17/2022 12:07     LOS: 25 days   Antonieta Pert, MD [facility]  11/19/2022, 8:50 AM

## 2022-11-20 ENCOUNTER — Ambulatory Visit
Admit: 2022-11-20 | Discharge: 2022-11-20 | Disposition: A | Discharge status: Home / Self Care | Payer: Medicare Other | Attending: Radiation Oncology | Admitting: Radiation Oncology

## 2022-11-20 ENCOUNTER — Inpatient Hospital Stay (HOSPITAL_COMMUNITY): Payer: Medicare Other

## 2022-11-20 ENCOUNTER — Other Ambulatory Visit: Payer: Self-pay

## 2022-11-20 DIAGNOSIS — C220 Liver cell carcinoma: Secondary | ICD-10-CM | POA: Diagnosis not present

## 2022-11-20 DIAGNOSIS — R52 Pain, unspecified: Secondary | ICD-10-CM | POA: Diagnosis not present

## 2022-11-20 DIAGNOSIS — C7951 Secondary malignant neoplasm of bone: Secondary | ICD-10-CM | POA: Diagnosis not present

## 2022-11-20 DIAGNOSIS — Z510 Encounter for antineoplastic radiation therapy: Secondary | ICD-10-CM | POA: Diagnosis not present

## 2022-11-20 LAB — COMPREHENSIVE METABOLIC PANEL
ALT: 19 U/L (ref 0–44)
AST: 31 U/L (ref 15–41)
Albumin: 3.2 g/dL — ABNORMAL LOW (ref 3.5–5.0)
Alkaline Phosphatase: 70 U/L (ref 38–126)
Anion gap: 8 (ref 5–15)
BUN: 75 mg/dL — ABNORMAL HIGH (ref 8–23)
CO2: 34 mmol/L — ABNORMAL HIGH (ref 22–32)
Calcium: 7.7 mg/dL — ABNORMAL LOW (ref 8.9–10.3)
Chloride: 98 mmol/L (ref 98–111)
Creatinine, Ser: 1.26 mg/dL — ABNORMAL HIGH (ref 0.44–1.00)
GFR, Estimated: 42 mL/min — ABNORMAL LOW (ref >60)
Glucose, Bld: 153 mg/dL — ABNORMAL HIGH (ref 70–99)
Potassium: 4.7 mmol/L (ref 3.5–5.1)
Sodium: 140 mmol/L (ref 135–145)
Total Bilirubin: 0.9 mg/dL (ref 0.3–1.2)
Total Protein: 5.7 g/dL — ABNORMAL LOW (ref 6.5–8.1)

## 2022-11-20 LAB — CBC
HCT: 33.8 % — ABNORMAL LOW (ref 36.0–46.0)
Hemoglobin: 10.4 g/dL — ABNORMAL LOW (ref 12.0–15.0)
MCH: 29.5 pg (ref 26.0–34.0)
MCHC: 30.8 g/dL (ref 30.0–36.0)
MCV: 95.8 fL (ref 80.0–100.0)
Platelets: 71 10*3/uL — ABNORMAL LOW (ref 150–400)
RBC: 3.53 MIL/uL — ABNORMAL LOW (ref 3.87–5.11)
RDW: 19.6 % — ABNORMAL HIGH (ref 11.5–15.5)
WBC: 8.2 10*3/uL (ref 4.0–10.5)
nRBC: 0 % (ref 0.0–0.2)

## 2022-11-20 LAB — GLUCOSE, CAPILLARY
Glucose-Capillary: 163 mg/dL — ABNORMAL HIGH (ref 70–99)
Glucose-Capillary: 117 mg/dL — ABNORMAL HIGH (ref 70–99)
Glucose-Capillary: 209 mg/dL — ABNORMAL HIGH (ref 70–99)
Glucose-Capillary: 131 mg/dL — ABNORMAL HIGH (ref 70–99)

## 2022-11-20 LAB — RAD ONC ARIA SESSION SUMMARY
Course Elapsed Days: 10
Plan Fractions Treated to Date: 7
Plan Prescribed Dose Per Fraction: 3 Gy
Plan Total Fractions Prescribed: 10
Plan Total Prescribed Dose: 30 Gy
Reference Point Dosage Given to Date: 21 Gy
Reference Point Session Dosage Given: 3 Gy
Session Number: 7

## 2022-11-20 MED ORDER — LACTULOSE 10 GM/15ML PO SOLN
20.0000 g | Freq: Two times a day (BID) | ORAL | Status: DC
  Administered 2022-11-20 (×2): 20 g via ORAL
  Filled 2022-11-20 (×2): qty 30

## 2022-11-20 MED ORDER — HYDROMORPHONE HCL 1 MG/ML IJ SOLN: 0.5000 mg | Freq: Four times a day (QID) | INTRAMUSCULAR | Status: DC | PRN

## 2022-11-20 MED ORDER — HYDROMORPHONE HCL 1 MG/ML IJ SOLN
0.5000 mg | INTRAMUSCULAR | Status: DC | PRN
  Administered 2022-11-20: 0.5 mg via INTRAVENOUS
  Filled 2022-11-20: qty 0.5

## 2022-11-20 MED ORDER — BISACODYL 10 MG RE SUPP: 10.0000 mg | Freq: Once | RECTAL | Status: DC

## 2022-11-20 NOTE — Progress Notes (Signed)
Overall, I really am not sure that Margaret Dyer is now much better.  She still has a pulmonary issues.  I am unsure how much she is getting out of bed.  We will have to get another chest x-ray to see how the lungs look.  She is on diuretics to try to pull fluid out of her.  Again, she just is somewhat debilitated.  I am just not sure if she is going to be able to improve enough that we can try systemic therapy on her.  She will finish up radiation to the L4 fracture this week.  I am not sure that she is really capable of doing physical therapy.  I think her appetite is doing okay.  Her prealbumin is still at a reasonable level of 19.  She says she does not complain of any pain.  Is still problems with constipation.  I will try her on some lactulose to see if this may help.  She has had no fever.  She has had no nausea or vomiting.  There has been no bleeding.  Her labs show sodium 140.  Her potassium is 4.7.  BUN 75 creatinine 1.26.  Calcium 7.7 with an albumin of 3.2.  Her white count is 8.2.  Hemoglobin 10.4.  Her platelet count is 71,000.  She has underlying cirrhosis from NASH.  This is why she is thrombocytopenic.  Her vital signs show temperature 97.8.  Pulse 63.  Blood pressure 128/58.  Her lungs still sound relatively clear bilaterally.  Cardiac exam regular rate and rhythm.  She has no murmurs.  Abdomen is soft.  Bowel sounds are present.  She has no fluid wave.  There is no palpable liver or spleen tip.  Extremity shows no clubbing, cyanosis or edema.  Skin exam does show some scattered ecchymoses.  Margaret Dyer has a metastatic hepatocellular carcinoma.  She has a pathologic fracture at L4.  She is getting radiation therapy for this right now.  She just does not able to do kyphoplasty as she cannot lie down on her stomach because of her pulmonary issues.  We will get a chest x-ray today to see if we can see improvements.  I just wonder if the changes in the lungs are reflective of  edema.  Again, I think she would finish up her radiation therapy this week.  I think the real question is where she will have to go.  She cannot go home because she just is not functional at home.  I know her family is incredibly supportive.  If they want to try to get her home, I would have no problems with this.  I just am not sure if she is a candidate for systemic therapy at this point.  It would be nice if we could treat her systemically because immunotherapy along with targeted therapy has been very effective for liver cancer.  She just has to be more active.  Maybe, physical therapy will work with her today.  As always, her faith is strong.  We had a very good prayer this morning.   Margaret Haw, MD  2 Cor 9:15

## 2022-11-20 NOTE — Progress Notes (Signed)
Per discussion with Dr. Marin Olp.  L4 pain resolved.  KP no longer needed.  Will cancel the order.    Electronically Signed: Pasty Spillers, PA-C 11/20/2022, 2:32 PM

## 2022-11-20 NOTE — Progress Notes (Signed)
PROGRESS NOTE Margaret Dyer  DGU:[id number] DOB: 03/17/1935 DOA: 10/13/2022 PCP: Ann Held, DO   Brief Narrative/Hospital Course: 87 year old with history of DM2, HTN, HLD, diastolic CHF, pulmonary HTN, cirrhosis, CKD stage IIIa, renal cell carcinoma with left nephrectomy in 2000, hypothyroidism presented to hospital with intractable back pain currently admitted with working diagnosis of metastatic hepatic cellular carcinoma, respiratory failure.  Her CT scan showed compression fracture of L4 with innumerable bilateral pulmonary metastases, posterior left acetabular wall fracture.  MRI confirmed these findings and patient was admitted to the hospital.  Radiology and oncology were consulted.  IR procedure for kyphoplasty or biopsy was postponed as patient was unable to lay flat.  Patient was transferred to [facility] for radiation treatments.  Since being here patient is getting pain control, Chemo-Port placed on 12/8. Hospital course complicated by hyperkalemia.  Due to some respiratory distress, CT chest was done which showed signs of volume overload, some reduction in pulmonary nodules.  Her respiratory status,  hydration and renal function has been very tenuous therefore off-and-on requiring IV fluids and diuretics.  12/13: Ultrasound shows small volume ascites, patient continues to remain short of breath placed on scheduled Lasix iv   Subjective: Seen and examined this morning appears slightly better from before no specific complaints although has chronic complaint of shortness of breath  He feels much better today. Repeat chest x-ray has been ordered BY ONCOLOGY Weight IMPROVED at 208lb of note wt was 228 lb on 12/12  Assessment and Plan:  Metastatic hepatocellular carcinoma Pathological L4 fracture w/ concern for metastasis Intractable back pain from l4 fracture Innumerable pulmonary metastasis History of renal cell carcinoma with left nephrectomy: Evaluated by oncology Dr.  Marin Olp on board with extensive workup including:MRI, CT of the pelvis, w/ 30% vertebral height loss L4.Kyphoplasty and biopsy on hold as patient unable to tolerate due to pain and discomfort. Oncology suspecting metastatic hepatocellular carcinoma due to elevated AFP. S/P liver biopsy-showed cirrhosis but no malignant disease. s/p chemo-Port placement, xrt started 12/8 and to cont through 12/27 for total 10 treatment.  Now no more complaint of pain continue with current Tramadol, fentanyl patch, lidocaine patch tylenol  and Decadron, as daughter request added prn Dilaudid 12/17.   Constipation: Continue MiraLAX stool softener.   Acute on chronic hypoxic respiratory failure Shortness of breath ongoing Grade 3 Diastolic dysfunction suspect acute on chronic disease/CHF Anasarca with leg edema/bilateral pleural effusion Cirrhosis>liver biopsy showed Cirrhosis (stage 4 of 4) of uncertain etiology, mild active steatohepatitis Small volume ascites on ultrasound 12/14 Bilateral pleural effusions:  Chronically complaining of shortness of breath, chronic respite status overall appears stable mild leg edema present-Suspected to be multifactorial from CHF COPD pneumonia anasarca coupled w/ deconditioning/debility and metastatic disease.CT chest 12/9: Moderate bilateral pleural effusion, pulmonary nodules, small volume ascites, cirrhosis, cardiomegaly,  BNP was 879,Procalcitonin 0.5.Echo  12/13>EF 60 to 65%, G3 DD restrictive, moderately elevated PASP. Will continue on IV Lasix 40 mg daily with monitoring of renal function ELECTROlytes ,weight is nicely improving as below. Cont nebsNet IO Since Admission: 1,804.21 mL [11/20/22 1142]  Filed Weights   11/17/22 0500 11/19/22 0335 11/20/22 0500  Weight: 96 kg 96.3 kg 94.6 kg   COPD:on home O2 3.5 l> here needing 3-4l [place].continue bronchodilators scheduled and as needed, cont supplemental oxygen.  Hyperkalemia: Improved tolerating diuretics.Lokelma discontinued  12/15.  Recent Labs  Lab 11/16/22 0530 11/17/22 0555 11/18/22 0555 11/19/22 0522 11/20/22 0536  K 4.7 4.4 4.4 4.8 4.7   Hypocalcemia stable,  monitor Recent Labs  Lab 11/16/22 0530 11/17/22 0555 11/18/22 0555 11/19/22 0522 11/20/22 0536  CALCIUM 7.5* 7.6* 7.9* 7.7* 7.7*     CKD stage IV, history of left nephrectomy: Baseline creatinine close to 1.7 currently improved to 1.2 and  tolerating lasix>continue to monitor while being diuresed Recent Labs    11/11/22 0729 11/12/22 0500 11/13/22 0523 11/14/22 0500 11/15/22 0245 11/16/22 0530 11/17/22 0555 11/18/22 0555 11/19/22 0522 11/20/22 0536  BUN 87* 90* 72* 84* 71* 71* 74* 78* 74* 75*  CREATININE 2.02* 1.80* 1.65* 1.44* 1.41* 1.41* 1.28* 1.29* 1.22* 1.26*    Pneumonia: completed meropenem 12/1-12/5  Chronic anxiety disorder on ativan: daughter requesting to schedule her Ativan to 0.5 mg nighttime   Posterior left acetabular wall fracture of indeterminate age: Noted on the CT of the hip with a small fracture 11/22, seen by [facility], recommended PWB with assistance of a walker, Follow in office as outpt 3 weeks for repeat xrays   Klebsiella UTI: Completed antibiotics  T2DM with intermittent hyperglycemia due to steroids: Blood sugar stable as below, continue SSI Semglee 9 units bedtime and 2 units Premeal  Recent Labs  Lab 11/19/22 1209 11/19/22 1642 11/19/22 2056 11/20/22 0733 11/20/22 1108  GLUCAP 116* 224* 145* 163* 117*    Hypertension:BP stable cont on coreg hydralazine, diuresis HLD stable continue Zetia and Zocor Hypothyroidism continue Synthroid    Thrombocytopenia, chronic:Does has ecchymosis,but no overt bleeding.hematology monitoring.   Recent Labs  Lab 11/16/22 0530 11/17/22 0555 11/18/22 0555 11/19/22 0522 11/20/22 0536  PLT 108* 98* 91* 81* 71*     Failure to thrive Goals of care: Appears debilitated, deconditioned, prognosis not bright in the setting of cancer continue  palliative care, dietitian input supportive care .  She is DNI status.  Increased nutrition need continue dronabinol, augment and nutrition supplement as below Nutrition Problem: Increased nutrient needs Etiology: acute illness, chronic illness Signs/Symptoms: estimated needs Interventions: Ensure Enlive (each supplement provides 350kcal and 20 grams of protein), Refer to RD note for recommendations   Class II Obesity:Patient's Body mass index is 36.94 kg/m. : Will benefit with PCP follow-up, weight loss  healthy lifestyle and outpatient sleep evaluation.  DVT prophylaxis: heparin injection 5,000 Units Start: 10/25/22 1400 SCDs Start: 10/25/22 0220 Code Status:   Code Status: Partial Code Family Communication: plan of care discussed with patient at bedside.  I had updated patient's daughter at the bedside and on the phone previously.   Patient status is: Inpatient because of ongoing need for radiation therapy inpatient Level of care: Telemetry  Dispo: The patient is from: Lives at daughter's home            Anticipated disposition: SNF once cleared by oncology after completion of radiation Objective: Vitals last 24 hrs: Vitals:   11/20/22 0500 11/20/22 0533 11/20/22 0911 11/20/22 0916  BP:  (!) 128/58    Pulse:  63    Resp:  16    Temp:  97.8 F (36.6 C)    TempSrc:  Oral    SpO2:  98% 98% 98%  Weight: 94.6 kg     Height:       Weight change: -1.7 kg  Physical Examination: General exam: Aaox2-3, weak,older appearing HEENT:Oral mucosa moist, Ear/Nose WNL grossly, dentition normal. Respiratory system: bilaterally diminished BS, no use of accessory muscle Cardiovascular system: S1 & S2 +, regular rate. Gastrointestinal system: Abdomen soft, obeseNT,ND,BS+ Nervous System:Alert, awake, moving extremities and grossly nonfocal Extremities: LE ankle edema +, lower extremities warm  Skin: No rashes,no icterus. MSK: weak/Normal muscle bulk,tone, power   Medications reviewed:   Scheduled Meds:  albuterol  2.5 mg Nebulization TID   carvedilol  12.5 mg Oral BID WC   Chlorhexidine Gluconate Cloth  6 each Topical Daily   dexamethasone  4 mg Oral Q12H   docusate sodium  100 mg Oral BID   dronabinol  2.5 mg Oral QAC lunch   feeding supplement  237 mL Oral TID BM   fentaNYL  1 patch Transdermal Q72H   fluconazole  100 mg Oral Daily   furosemide  40 mg Intravenous Daily   heparin  5,000 Units Subcutaneous Q8H   hydrALAZINE  50 mg Oral Q8H   insulin aspart  0-5 Units Subcutaneous QHS   insulin aspart  0-9 Units Subcutaneous TID WC   insulin aspart  2 Units Subcutaneous TID WC   insulin glargine-yfgn  9 Units Subcutaneous QHS   lactulose  20 g Oral BID   levothyroxine  25 mcg Oral Q MTWThF   lidocaine  1 patch Transdermal QHS   LORazepam  0.5 mg Oral QHS   metolazone  5 mg Oral Q24H   multivitamin with minerals  1 tablet Oral Daily   oxybutynin  5 mg Oral Daily   polyethylene glycol  17 g Oral BID   senna-docusate  2 tablet Oral BID  Continuous Infusions:  sodium chloride      Diet Order             Diet regular Room service appropriate? Yes; Fluid consistency: Thin  Diet effective now                  Intake/Output Summary (Last 24 hours) at 11/20/2022 1142 Last data filed at 11/20/2022 0500 Gross per 24 hour  Intake 0 ml  Output 1550 ml  Net -1550 ml   Net IO Since Admission: 1,804.21 mL [11/20/22 1142]  Wt Readings from Last 3 Encounters:  11/20/22 94.6 kg  10/20/22 82.9 kg  09/04/22 85.8 kg    Unresulted Labs (From admission, onward)     Start     Ordered   11/21/22 0500  CBC with Differential/Platelet  Daily,   R     Question:  Specimen collection method  Answer:  Unit=Unit collect   11/20/22 0712   11/18/22 0500  Comprehensive metabolic panel  Daily,   R     Question:  Specimen collection method  Answer:  Unit=Unit collect   11/17/22 0700          Data Reviewed: I have personally reviewed following labs and imaging  studies CBC: Recent Labs  Lab 11/16/22 0530 11/17/22 0555 11/18/22 0555 11/19/22 0522 11/20/22 0536  WBC 5.3 5.9 7.5 7.7 8.2  HGB 9.8* 10.5* 10.5* 10.4* 10.4*  HCT 31.9* 34.2* 33.9* 33.7* 33.8*  MCV 95.5 94.5 94.2 96.6 95.8  PLT 108* 98* 91* 81* 71*    Basic Metabolic Panel: Recent Labs  Lab 11/14/22 0500 11/15/22 0245 11/16/22 0530 11/17/22 0555 11/18/22 0555 11/19/22 0522 11/20/22 0536  NA 139 140 142 140 140 139 140  K 5.3* 5.2* 4.7 4.4 4.4 4.8 4.7  CL 107 108 106 101 100 98 98  CO2 _0 32 32 34*  GLUCOSE 194* 170* 157* 127* 165* 174* 153*  BUN 84* 71* 71* 74* 78* 74* 75*  CREATININE 1.44* 1.41* 1.41* 1.28* 1.29* 1.22* 1.26*  CALCIUM 7.0* 7.4* 7.5* 7.6* 7.9* 7.7* 7.7*  MG 3.3* 2.8*  --   --   --   --   --  GFR: Estimated Creatinine Clearance: 35.7 mL/min (A) (by C-G formula based on SCr of 1.26 mg/dL (H)). Liver Function Tests: Recent Labs  Lab 11/18/22 0555 11/19/22 0522 11/20/22 0536  AST 32 30 31  ALT _0 ALKPHOS 74 68 70  BILITOT 0.9 0.8 0.9  PROT 5.9* 5.6* 5.7*  ALBUMIN 3.3* 3.0* 3.2*   No results found for this or any previous visit (from the past 240 hour(s)).  Antimicrobials: Anti-infectives (From admission, onward)    Start     Dose/Rate Route Frequency Ordered Stop   11/16/22 1000  fluconazole (DIFLUCAN) tablet 100 mg        100 mg Oral Daily 11/16/22 0658     11/13/22 1000  azithromycin (ZITHROMAX) tablet 500 mg  Status:  Discontinued        500 mg Oral Daily 11/13/22 0732 11/16/22 0722   11/11/22 0800  azithromycin (ZITHROMAX) 500 mg in sodium chloride 0.9 % 250 mL IVPB  Status:  Discontinued        500 mg 250 mL/hr over 60 Minutes Intravenous Every 24 hours 11/11/22 0717 11/13/22 0732   11/30/2022 0800  meropenem (MERREM) 500 mg in sodium chloride 0.9 % 100 mL IVPB        500 mg 200 mL/hr over 30 Minutes Intravenous 2 times daily 11/13/2022 0637 11/07/22 2316   11/02/22 0800  vancomycin (VANCOCIN) IVPB 1000 mg/200 mL premix         1,000 mg 200 mL/hr over 60 Minutes Intravenous To Radiology 11/02/22 0710 11/20/2022 0800   10/25/22 0715  cefTRIAXone (ROCEPHIN) 1 g in sodium chloride 0.9 % 100 mL IVPB  Status:  Discontinued        1 g 200 mL/hr over 30 Minutes Intravenous Every 24 hours 10/25/22 0701 10/27/22 1515      Culture/Microbiology    Component Value Date/Time   SDES URINE, CLEAN CATCH 10/28/2022 0208   SPECREQUEST NONE 10/28/2022 0208   CULT  10/28/2022 0208    NO GROWTH Performed at [facility], [place] [address], [place], [place] [address]    REPTSTATUS 10/29/2022 FINAL 10/28/2022 0208   Radiology Studies: No results found.   LOS: 26 days   Antonieta Pert, MD [facility]  11/20/2022, 11:42 AM

## 2022-11-20 NOTE — Progress Notes (Signed)
Nutrition Follow-up  INTERVENTION:   -Ensure Plus High Protein po TID, each supplement provides 350 kcal and 20 grams of protein.   -Multivitamin with minerals daily   -"High Calorie, High Protein Nutrition Therapy" handout added to AVS   NUTRITION DIAGNOSIS:   Increased nutrient needs related to acute illness, chronic illness as evidenced by estimated needs.  Ongoing.  GOAL:   Patient will meet greater than or equal to 90% of their needs  Progressing.  MONITOR:   PO intake, Supplement acceptance, Labs, Weight trends  ASSESSMENT:   Pt admitted with intractable back pain. PMH significant for chronic respiratory failure, CHF, renal cell carcinoma s/p L nephrectomy (2000), liver cirrhosis, diabetes.  11/21: admitted 12/8: s/p port placement  Patient is accepting Ensure supplements. Currently consuming ~75% of meals.   Admission weight: 182 lbs Current weight: 208 lbs  Medications: Colace, Marinol, Lasix, Lactulose, Multivitamin with minerals daily, Miralax, Senokot  Labs reviewed: CBGs: 116-261   Diet Order:   Diet Order             Diet regular Room service appropriate? Yes; Fluid consistency: Thin  Diet effective now                   EDUCATION NEEDS:   Education needs have been addressed  Skin:  Skin Assessment: Reviewed RN Assessment  Last BM:  12/17 -type 5  Height:   Ht Readings from Last 1 Encounters:  10/13/2022 _0  (1.6 m)    Weight:   Wt Readings from Last 1 Encounters:  11/20/22 94.6 kg    BMI:  Body mass index is 36.94 kg/m.  Estimated Nutritional Needs:   Kcal:  1800-2000  Protein:  90-105g  Fluid:  >/=1.8L  Clayton Bibles, MS, RD, LDN Inpatient Clinical Dietitian Contact information available via Amion

## 2022-11-21 ENCOUNTER — Ambulatory Visit
Admit: 2022-11-21 | Discharge: 2022-11-21 | Disposition: A | Discharge status: Home / Self Care | Payer: Medicare Other | Attending: Radiation Oncology | Admitting: Radiation Oncology

## 2022-11-21 ENCOUNTER — Inpatient Hospital Stay (HOSPITAL_COMMUNITY): Payer: Medicare Other

## 2022-11-21 ENCOUNTER — Other Ambulatory Visit: Payer: Self-pay

## 2022-11-21 DIAGNOSIS — C7951 Secondary malignant neoplasm of bone: Secondary | ICD-10-CM | POA: Diagnosis not present

## 2022-11-21 DIAGNOSIS — R52 Pain, unspecified: Secondary | ICD-10-CM | POA: Diagnosis not present

## 2022-11-21 DIAGNOSIS — C220 Liver cell carcinoma: Secondary | ICD-10-CM | POA: Diagnosis not present

## 2022-11-21 DIAGNOSIS — Z510 Encounter for antineoplastic radiation therapy: Secondary | ICD-10-CM | POA: Diagnosis not present

## 2022-11-21 LAB — CBC WITH DIFFERENTIAL/PLATELET
Abs Immature Granulocytes: 0.2 10*3/uL — ABNORMAL HIGH (ref 0.00–0.07)
Basophils Absolute: 0 10*3/uL (ref 0.0–0.1)
Basophils Relative: 0 %
Eosinophils Absolute: 0 10*3/uL (ref 0.0–0.5)
Eosinophils Relative: 0 %
HCT: 35.4 % — ABNORMAL LOW (ref 36.0–46.0)
Hemoglobin: 10.8 g/dL — ABNORMAL LOW (ref 12.0–15.0)
Immature Granulocytes: 1 %
Lymphocytes Relative: 2 %
Lymphs Abs: 0.4 10*3/uL — ABNORMAL LOW (ref 0.7–4.0)
MCH: 29 pg (ref 26.0–34.0)
MCHC: 30.5 g/dL (ref 30.0–36.0)
MCV: 95.2 fL (ref 80.0–100.0)
Monocytes Absolute: 2.4 10*3/uL — ABNORMAL HIGH (ref 0.1–1.0)
Monocytes Relative: 13 %
Neutro Abs: 15.2 10*3/uL — ABNORMAL HIGH (ref 1.7–7.7)
Neutrophils Relative %: 84 %
Platelets: 66 10*3/uL — ABNORMAL LOW (ref 150–400)
RBC: 3.72 MIL/uL — ABNORMAL LOW (ref 3.87–5.11)
RDW: 19.6 % — ABNORMAL HIGH (ref 11.5–15.5)
WBC: 18.2 10*3/uL — ABNORMAL HIGH (ref 4.0–10.5)
nRBC: 0.1 % (ref 0.0–0.2)

## 2022-11-21 LAB — COMPREHENSIVE METABOLIC PANEL
ALT: 17 U/L (ref 0–44)
AST: 34 U/L (ref 15–41)
Albumin: 2.7 g/dL — ABNORMAL LOW (ref 3.5–5.0)
Alkaline Phosphatase: 68 U/L (ref 38–126)
Anion gap: 8 (ref 5–15)
BUN: 87 mg/dL — ABNORMAL HIGH (ref 8–23)
CO2: 35 mmol/L — ABNORMAL HIGH (ref 22–32)
Calcium: 7.8 mg/dL — ABNORMAL LOW (ref 8.9–10.3)
Chloride: 98 mmol/L (ref 98–111)
Creatinine, Ser: 1.42 mg/dL — ABNORMAL HIGH (ref 0.44–1.00)
GFR, Estimated: 36 mL/min — ABNORMAL LOW (ref >60)
Glucose, Bld: 54 mg/dL — ABNORMAL LOW (ref 70–99)
Potassium: 5 mmol/L (ref 3.5–5.1)
Sodium: 141 mmol/L (ref 135–145)
Total Bilirubin: 1 mg/dL (ref 0.3–1.2)
Total Protein: 5.4 g/dL — ABNORMAL LOW (ref 6.5–8.1)

## 2022-11-21 LAB — RAD ONC ARIA SESSION SUMMARY
Course Elapsed Days: 11
Plan Fractions Treated to Date: 8
Plan Prescribed Dose Per Fraction: 3 Gy
Plan Total Fractions Prescribed: 10
Plan Total Prescribed Dose: 30 Gy
Reference Point Dosage Given to Date: 24 Gy
Reference Point Session Dosage Given: 3 Gy
Session Number: 8

## 2022-11-21 LAB — GLUCOSE, CAPILLARY
Glucose-Capillary: 47 mg/dL — ABNORMAL LOW (ref 70–99)
Glucose-Capillary: 79 mg/dL (ref 70–99)
Glucose-Capillary: 154 mg/dL — ABNORMAL HIGH (ref 70–99)
Glucose-Capillary: 169 mg/dL — ABNORMAL HIGH (ref 70–99)
Glucose-Capillary: 137 mg/dL — ABNORMAL HIGH (ref 70–99)

## 2022-11-21 LAB — MRSA NEXT GEN BY PCR, NASAL: MRSA by PCR Next Gen: NOT DETECTED

## 2022-11-21 LAB — PROCALCITONIN: Procalcitonin: 2.64 ng/mL

## 2022-11-21 MED ORDER — HYDRALAZINE HCL 25 MG PO TABS
25.0000 mg | ORAL_TABLET | Freq: Three times a day (TID) | ORAL | Status: DC
  Administered 2022-11-22: 25 mg via ORAL
  Filled 2022-11-21: qty 1

## 2022-11-21 MED ORDER — IOHEXOL 9 MG/ML PO SOLN
ORAL | Status: AC
  Filled 2022-11-21: qty 1000

## 2022-11-21 MED ORDER — VANCOMYCIN HCL IN DEXTROSE 1-5 GM/200ML-% IV SOLN
1000.0000 mg | INTRAVENOUS | Status: DC
  Administered 2022-11-21: 1000 mg via INTRAVENOUS
  Filled 2022-11-21: qty 200

## 2022-11-21 MED ORDER — HYDROMORPHONE HCL 1 MG/ML IJ SOLN
0.5000 mg | INTRAMUSCULAR | Status: DC | PRN
  Administered 2022-11-21: 0.5 mg via SUBCUTANEOUS
  Filled 2022-11-21 (×2): qty 0.5

## 2022-11-21 MED ORDER — ALBUTEROL SULFATE (2.5 MG/3ML) 0.083% IN NEBU
2.5000 mg | INHALATION_SOLUTION | Freq: Two times a day (BID) | RESPIRATORY_TRACT | Status: DC
  Administered 2022-11-21 – 2022-11-22 (×3): 2.5 mg via RESPIRATORY_TRACT
  Filled 2022-11-21 (×3): qty 3

## 2022-11-21 MED ORDER — METRONIDAZOLE 500 MG/100ML IV SOLN
500.0000 mg | Freq: Two times a day (BID) | INTRAVENOUS | Status: DC
  Administered 2022-11-21 – 2022-11-23 (×4): 500 mg via INTRAVENOUS
  Filled 2022-11-21 (×4): qty 100

## 2022-11-21 MED ORDER — IOHEXOL 9 MG/ML PO SOLN: 500.0000 mL | ORAL | Status: AC

## 2022-11-21 MED ORDER — DEXAMETHASONE 4 MG PO TABS
4.0000 mg | ORAL_TABLET | Freq: Every day | ORAL | Status: DC
  Administered 2022-11-21 – 2022-11-22 (×2): 4 mg via ORAL
  Filled 2022-11-21 (×2): qty 1

## 2022-11-21 MED ORDER — PIPERACILLIN-TAZOBACTAM 3.375 G IVPB
3.3750 g | Freq: Three times a day (TID) | INTRAVENOUS | Status: DC
  Administered 2022-11-21: 3.375 g via INTRAVENOUS
  Filled 2022-11-21: qty 50

## 2022-11-21 MED ORDER — DEXTROSE-NACL 5-0.9 % IV SOLN: INTRAVENOUS | Status: DC

## 2022-11-21 MED ORDER — CARVEDILOL 6.25 MG PO TABS
6.2500 mg | ORAL_TABLET | Freq: Two times a day (BID) | ORAL | Status: DC
  Administered 2022-11-22: 6.25 mg via ORAL
  Filled 2022-11-21: qty 1

## 2022-11-21 MED ORDER — SODIUM CHLORIDE 0.9 % IV SOLN
2.0000 g | Freq: Two times a day (BID) | INTRAVENOUS | Status: DC
  Administered 2022-11-21 – 2022-11-23 (×4): 2 g via INTRAVENOUS
  Filled 2022-11-21 (×4): qty 12.5

## 2022-11-21 MED ORDER — SODIUM CHLORIDE 0.9 % IV SOLN: 2.0000 g | Freq: Two times a day (BID) | INTRAVENOUS | Status: DC

## 2022-11-21 NOTE — Progress Notes (Signed)
PROGRESS NOTE Margaret Dyer  YPP:[id number] DOB: 31-Mar-1935 DOA: 10/26/2022 PCP: Ann Held, DO   Brief Narrative/Hospital Course: 87 year old with history of DM2, HTN, HLD, diastolic CHF, pulmonary HTN, cirrhosis, CKD stage IIIa, renal cell carcinoma with left nephrectomy in 2000, hypothyroidism presented to hospital with intractable back pain currently admitted with working diagnosis of metastatic hepatic cellular carcinoma, respiratory failure.  Her CT scan showed compression fracture of L4 with innumerable bilateral pulmonary metastases, posterior left acetabular wall fracture.  MRI confirmed these findings and patient was admitted to the hospital.  Radiology and oncology were consulted.  IR procedure for kyphoplasty or biopsy was postponed as patient was unable to lay flat.  Patient was transferred to [facility] for radiation treatments.  Since being here patient is getting pain control, Chemo-Port placed on 12/8. Hospital course complicated by hyperkalemia.  Due to some respiratory distress, CT chest was done which showed signs of volume overload, some reduction in pulmonary nodules.  Her respiratory status,  hydration and renal function has been very tenuous therefore off-and-on requiring IV fluids and diuretics.  12/13: Ultrasound shows small volume ascites, patient continues to remain short of breath placed on scheduled Lasix iv. 12/18: Patient having ongoing rectal pain after disimpaction.  Having some mild loose stool after laxatives, continues to complain of shortness of breath. 12/19: Now more lethargic hypoglycemic blood pressure soft and with worsening leukocytosis started on antibiotic for pneumonia coverage.  Subjective:  Seen and examined this morning appears lethargic, complains of pain all over Blood sugar was low this morning BP 99 Labs with worsening leukocytosis, Pro-Cal 2.6 hemoglobin 10.8 g platelets 66 K, creatinine slightly up, hyperglycemic at 47 improved to  79  Assessment and Plan: Metastatic hepatocellular carcinoma Pathological L4 fracture w/ concern for metastasis Intractable back pain from l4 fracture Innumerable pulmonary metastasis History of renal cell carcinoma with left nephrectomy: Evaluated by oncology Dr. Marin Olp on board with extensive workup including:MRI, CT of the pelvis, w/ 30% vertebral height loss L4.pain from L4 improved kyphoplasty canceled.  Initially kyphoplasty and biopsy not done as she was unable to tolerate due to pain and discomfort. Oncology suspecting metastatic hepatocellular carcinoma due to elevated AFP. S/P liver biopsy-showed cirrhosis but no malignant disease. s/p chemo-Port placement, xrt started 12/8 and to cont through 12/27 for total 10 treatment.  Patient continues to have pain all over/rectal pain, now with lethargy.  Overall not much improvement.  Discussed with oncology>Onco would recommend hospice but daughter wanting aggressive care, palliative care has been consulted. Continue with current Tramadol, fentanyl patch, lidocaine patch tylenol  and Decadron, Dilaudid subcu as long as BP tolerates, per onco  Rectal pain Constipations/p disimpaction: Now with some loose stool, hold MiraLAX and stool softener today.  X-ray abdomen 12/18-diffuse gaseous distention mildly prominent stool and retained contrast in the rectum.  CT abdomen ordered.    Pneumonia: completed meropenem 12/1-12/5 Patchy bilateral pulmonary infiltrates on new cxr, concerning for pneumonia given bump in WBC count elevated procalcitonin: Starting vancomycin cefepime Flagyl.  Blood culture ordered.  Requested speech eval.  Type 2 diabetes mellitus with hypoglycemia 12/19 am, previously hyperglycemic: Keep on gentle IV fluids with dextrose, discontinued lisinopril and Premeal insulin just keep on sliding scale insulin for now.  She is on Decadron. Recent Labs  Lab 11/20/22 1108 11/20/22 1634 11/20/22 2117 11/21/22 0724 11/21/22 0748   GLUCAP 117* 209* 131* 47* 79    Acute on chronic hypoxic respiratory failure Shortness of breath ongoing Grade 3 Diastolic dysfunction  suspect acute on chronic disease/CHF Anasarca with leg edema/bilateral pleural effusion Cirrhosis>liver biopsy showed Cirrhosis (stage 4 of 4) of uncertain etiology, mild active steatohepatitis Small volume ascites on ultrasound 12/14 Bilateral pleural effusions:  Chronically complaining of shortness of breath, chronic respite status overall appears stable mild leg edema present-Suspected to be multifactorial from CHF COPD pneumonia anasarca coupled w/ deconditioning/debility and metastatic disease.CT chest 12/9: Moderate bilateral pleural effusion, pulmonary nodules, small volume ascites, cirrhosis, cardiomegaly,  BNP was 879,Procalcitonin 0.5.Echo  12/13>EF 60 to 65%, G3 DD restrictive, moderately elevated PASP.  Given IV Lasix for several days overall weight has improved  197 lb which is close to basleline 189 on 11/09/2022, was high up to 228 on 01/15/22.  BP soft.  Lasix stopped 12/19 Net IO Since Admission: 4.21 mL [11/21/22 1104] Filed Weights   11/19/22 0335 11/20/22 0500 11/21/22 0628  Weight: 96.3 kg 94.6 kg 89.5 kg   COPD:on home O2 3.5 l> here needing 3-4l [place].continue bronchodilators scheduled and as needed, cont supplemental oxygen.  Hyperkalemia: Improved tolerating diuretics.Lokelma discontinued 12/15.  Monitor Hypocalcemia stable, monitor  CKD stage IV, history of left nephrectomy: Baseline creatinine close to 1.7 currently improved to 1.2 > 1.4.  Stopping Lasix gentle IV fluids for soft BP  Recent Labs    11/12/22 0500 11/13/22 0523 11/14/22 0500 11/15/22 0245 11/16/22 0530 11/17/22 0555 11/18/22 0555 11/19/22 0522 11/20/22 0536 11/21/22 0601  BUN 90* 72* 84* 71* 71* 74* 78* 74* 75* 87*  CREATININE 1.80* 1.65* 1.44* 1.41* 1.41* 1.28* 1.29* 1.22* 1.26* 1.42*    Chronic anxiety disorder on ativan: daughter requesting to schedule her  Ativan to 0.5 mg nighttime   Posterior left acetabular wall fracture of indeterminate age: Noted on the CT of the hip with a small fracture 11/22, seen by [facility], recommended PWB with assistance of a walker, Follow in office as outpt 3 weeks for repeat xrays   Klebsiella UTI: Completed antibiotics  Hypertension:BP soft> decreased Coreg dose from 12.5 mg twice daily, post Coreg and hydralazine for tomorrow if blood pressure stable diuresis discontinued   HLD stable continue Zetia and Zocor Hypothyroidism continue Synthroid    Thrombocytopenia, chronic:Does has ecchymosis,but no overt bleeding.hematology monitoring.   Recent Labs  Lab 11/17/22 0555 11/18/22 0555 11/19/22 0522 11/20/22 0536 11/21/22 0601  PLT 98* 91* 81* 71* 66*     Failure to thrive Goals of care: Appears debilitated, deconditioned, prognosis appears poor, discussed with oncology.  Palliative care has been counseled to date, continue to monitor closely continue supportive care. She is DNI status. Extensively updated her daughter today she understands the reality of how things are going but she is optimistic.  I explained that I am worried about her and she is having clinical decline.  She agrees for palliative care evaluation.  Her biggest concern is about her pain but we are limited given her soft bp  Increased nutrition need continue dronabinol, augment and nutrition supplement as below Nutrition Problem: Increased nutrient needs Etiology: acute illness, chronic illness Signs/Symptoms: estimated needs Interventions: Ensure Enlive (each supplement provides 350kcal and 20 grams of protein), Refer to RD note for recommendations   Class II Obesity:Patient's Body mass index is 34.95 kg/m. : Will benefit with PCP follow-up, weight loss  healthy lifestyle and outpatient sleep evaluation.  DVT prophylaxis: heparin injection 5,000 Units Start: 10/25/22 1400 SCDs Start: 10/25/22 0220 Code Status:   Code  Status: Partial Code Family Communication: plan of care discussed with patient at bedside.  I had  updated patient's daughter at the bedside and on the phone previously Will update daughter again.   Patient status is: Inpatient because of ongoing need for radiation therapy inpatient Level of care: Telemetry  Dispo: The patient is from: Lives at daughter's home            Anticipated disposition: SNF once cleared by oncology after completion of radiation Objective: Vitals last 24 hrs: Vitals:   11/20/22 1524 11/20/22 2020 11/21/22 0628 11/21/22 0758  BP: (!) 167/60 (!) 155/57 (!) 99/46   Pulse: 72 75 70   Resp: _0 Temp: 98.5 F (36.9 C) 97.6 F (36.4 C) 97.7 F (36.5 C)   TempSrc: Oral Oral Oral   SpO2: 96% 94% 96% 91%  Weight:   89.5 kg   Height:       Weight change: -5.1 kg  Physical Examination: General exam: Lethargic, in pain frail elderly HEENT:Oral mucosa moist, Ear/Nose WNL grossly, dentition normal. Respiratory system: bilaterally diminished BS, no use of accessory muscle Cardiovascular system: S1 & S2 +, regular rate. Gastrointestinal system: Abdomen soft, tender diffusely,ND,BS+ Nervous System:Alert, awake, moving extremities and grossly nonfocal Extremities: LE ankle edema trace, lower extremities warm Skin: No rashes,no icterus. MSK: Normal muscle bulk,weak tone  Medications reviewed:  Scheduled Meds:  albuterol  2.5 mg Nebulization BID   bisacodyl  10 mg Rectal Once   [START ON 11/22/2022] carvedilol  6.25 mg Oral BID WC   Chlorhexidine Gluconate Cloth  6 each Topical Daily   dexamethasone  4 mg Oral Daily   docusate sodium  100 mg Oral BID   dronabinol  2.5 mg Oral QAC lunch   feeding supplement  237 mL Oral TID BM   fentaNYL  1 patch Transdermal Q72H   fluconazole  100 mg Oral Daily   heparin  5,000 Units Subcutaneous Q8H   hydrALAZINE  25 mg Oral Q8H   insulin aspart  0-5 Units Subcutaneous QHS   insulin aspart  0-9 Units Subcutaneous TID WC    iohexol  500 mL Oral Q1H   iohexol       levothyroxine  25 mcg Oral Q MTWThF   LORazepam  0.5 mg Oral QHS   multivitamin with minerals  1 tablet Oral Daily   oxybutynin  5 mg Oral Daily   polyethylene glycol  17 g Oral BID   senna-docusate  2 tablet Oral BID  Continuous Infusions:  sodium chloride     ceFEPime (MAXIPIME) IV     dextrose 5 % and 0.9% NaCl 50 mL/hr at 11/21/22 0804   metronidazole     vancomycin 1,000 mg (11/21/22 1038)    Diet Order             Diet regular Room service appropriate? Yes; Fluid consistency: Thin  Diet effective now                  Intake/Output Summary (Last 24 hours) at 11/21/2022 1104 Last data filed at 11/21/2022 1011 Gross per 24 hour  Intake 0 ml  Output 1800 ml  Net -1800 ml   Net IO Since Admission: 4.21 mL [11/21/22 1104]  Wt Readings from Last 3 Encounters:  11/21/22 89.5 kg  10/20/22 82.9 kg  09/04/22 85.8 kg    Unresulted Labs (From admission, onward)     Start     Ordered   11/21/22 0811  MRSA Next Gen by PCR, Nasal  (MRSA Screening)  Once,   R  11/21/22 0810   11/21/22 0723  Culture, blood (Routine X 2) w Reflex to ID Panel  BLOOD CULTURE X 2,   R (with TIMED occurrences)     Question:  Patient immune status  Answer:  Immunocompromised   11/21/22 0722   11/21/22 0712  Culture, Respiratory w Gram Stain  Once,   R       Question:  Patient immune status  Answer:  Immunocompromised   11/21/22 0717   11/21/22 0500  CBC with Differential/Platelet  Daily,   R     Question:  Specimen collection method  Answer:  Unit=Unit collect   11/20/22 0712   11/21/22 0500  Procalcitonin  Daily,   R     Question:  Specimen collection method  Answer:  Unit=Unit collect   11/20/22 1617   11/18/22 0500  Comprehensive metabolic panel  Daily,   R     Question:  Specimen collection method  Answer:  Unit=Unit collect   11/17/22 0700          Data Reviewed: I have personally reviewed following labs and imaging  studies CBC: Recent Labs  Lab 11/17/22 0555 11/18/22 0555 11/19/22 0522 11/20/22 0536 11/21/22 0601  WBC 5.9 7.5 7.7 8.2 18.2*  NEUTROABS  --   --   --   --  15.2*  HGB 10.5* 10.5* 10.4* 10.4* 10.8*  HCT 34.2* 33.9* 33.7* 33.8* 35.4*  MCV 94.5 94.2 96.6 95.8 95.2  PLT 98* 91* 81* 71* 66*    Basic Metabolic Panel: Recent Labs  Lab 11/15/22 0245 11/16/22 0530 11/17/22 0555 11/18/22 0555 11/19/22 0522 11/20/22 0536 11/21/22 0601  NA 140   < > 140 140 139 140 141  K 5.2*   < > 4.4 4.4 4.8 4.7 5.0  CL 108   < > 101 100 98 98 98  CO2 27   < > 29 32 32 34* 35*  GLUCOSE 170*   < > 127* 165* 174* 153* 54*  BUN 71*   < > 74* 78* 74* 75* 87*  CREATININE 1.41*   < > 1.28* 1.29* 1.22* 1.26* 1.42*  CALCIUM 7.4*   < > 7.6* 7.9* 7.7* 7.7* 7.8*  MG 2.8*  --   --   --   --   --   --    < > = values in this interval not displayed.   GFR: Estimated Creatinine Clearance: 30.7 mL/min (A) (by C-G formula based on SCr of 1.42 mg/dL (H)). Liver Function Tests: Recent Labs  Lab 11/18/22 0555 11/19/22 0522 11/20/22 0536 11/21/22 0601  AST 32 30 31 34  ALT _0 ALKPHOS 74 68 70 68  BILITOT 0.9 0.8 0.9 1.0  PROT 5.9* 5.6* 5.7* 5.4*  ALBUMIN 3.3* 3.0* 3.2* 2.7*   No results found for this or any previous visit (from the past 240 hour(s)).  Antimicrobials: Anti-infectives (From admission, onward)    Start     Dose/Rate Route Frequency Ordered Stop   11/21/22 1700  metroNIDAZOLE (FLAGYL) IVPB 500 mg        500 mg 100 mL/hr over 60 Minutes Intravenous Every 12 hours 11/21/22 0816     11/21/22 1600  ceFEPIme (MAXIPIME) 2 g in sodium chloride 0.9 % 100 mL IVPB        2 g 200 mL/hr over 30 Minutes Intravenous Every 12 hours 11/21/22 0816     11/21/22 1000  vancomycin (VANCOCIN) IVPB 1000 mg/200 mL premix  1,000 mg 200 mL/hr over 60 Minutes Intravenous Every 48 hours 11/21/22 0807     11/21/22 0900  piperacillin-tazobactam (ZOSYN) IVPB 3.375 g  Status:  Discontinued         3.375 g 12.5 mL/hr over 240 Minutes Intravenous Every 8 hours 11/21/22 0748 11/21/22 0816   11/21/22 0815  ceFEPIme (MAXIPIME) 2 g in sodium chloride 0.9 % 100 mL IVPB  Status:  Discontinued        2 g 200 mL/hr over 30 Minutes Intravenous Every 12 hours 11/21/22 0727 11/21/22 0748   11/16/22 1000  fluconazole (DIFLUCAN) tablet 100 mg        100 mg Oral Daily 11/16/22 0658     11/13/22 1000  azithromycin (ZITHROMAX) tablet 500 mg  Status:  Discontinued        500 mg Oral Daily 11/13/22 0732 11/16/22 0722   11/11/22 0800  azithromycin (ZITHROMAX) 500 mg in sodium chloride 0.9 % 250 mL IVPB  Status:  Discontinued        500 mg 250 mL/hr over 60 Minutes Intravenous Every 24 hours 11/11/22 0717 11/13/22 0732   11/13/2022 0800  meropenem (MERREM) 500 mg in sodium chloride 0.9 % 100 mL IVPB        500 mg 200 mL/hr over 30 Minutes Intravenous 2 times daily 11/04/2022 0637 11/07/22 2316   11/02/22 0800  vancomycin (VANCOCIN) IVPB 1000 mg/200 mL premix        1,000 mg 200 mL/hr over 60 Minutes Intravenous To Radiology 11/02/22 0710 11/21/2022 0800   10/25/22 0715  cefTRIAXone (ROCEPHIN) 1 g in sodium chloride 0.9 % 100 mL IVPB  Status:  Discontinued        1 g 200 mL/hr over 30 Minutes Intravenous Every 24 hours 10/25/22 0701 10/27/22 1515      Culture/Microbiology    Component Value Date/Time   SDES URINE, CLEAN CATCH 10/28/2022 0208   SPECREQUEST NONE 10/28/2022 0208   CULT  10/28/2022 0208    NO GROWTH Performed at [facility], [place] [address], [place], [place] [address]    REPTSTATUS 10/29/2022 FINAL 10/28/2022 0208   Radiology Studies: DG Chest 2 View  Result Date: 11/20/2022 CLINICAL DATA:  Pneumonia EXAM: CHEST - 2 VIEW COMPARISON:  11/17/2022 FINDINGS: RIGHT jugular Port-A-Cath with tip projecting over SVC. Enlargement of cardiac silhouette with pulmonary vascular congestion. Atherosclerotic calcifications and mild tortuosity of thoracic aorta. Patchy BILATERAL pulmonary  infiltrates, greater in upper lobes, favor multifocal pneumonia. Decreased pleural effusion since previous exam. No pneumothorax. Bones demineralized. IMPRESSION: Patchy BILATERAL pulmonary infiltrates concerning for pneumonia. Enlargement of cardiac silhouette with pulmonary vascular congestion. Aortic Atherosclerosis (ICD10-I70.0). Electronically Signed   By: Lavonia Dana M.D.   On: 11/20/2022 16:06   DG Abd 1 View  Result Date: 11/20/2022 CLINICAL DATA:  Constipation, rectal pain EXAM: ABDOMEN - 1 VIEW COMPARISON:  Portable exam 1556 hours compared to 08/15/2016 FINDINGS: Mildly prominent retained contrast and stool in rectum. Gaseous distention of colon, mild. No evidence of small bowel obstruction or wall thickening. Bones demineralized. Surgical clips LEFT mid abdomen. Osseous demineralization with prior proximal LEFT femoral ORIF. IMPRESSION: Mildly prominent stool and retained contrast in rectum. Mild diffuse gaseous distention of colon. Electronically Signed   By: Lavonia Dana M.D.   On: 11/20/2022 16:05     LOS: 66 days   Antonieta Pert, MD [facility]  11/21/2022, 11:04 AM

## 2022-11-21 NOTE — Progress Notes (Signed)
Patient refused to be disimpacted after MD in to see on unit this evening, wanted to drink coffee and rest, instructed patient and daughter that we would try again later and patient requested something be given for pain prior to disimpacting, Charge RN updated

## 2022-11-21 NOTE — Progress Notes (Signed)
  Daily Progress Note   Patient Name: Margaret Dyer       Date: 11/21/2022 DOB: 1935/03/01  Age: 87 y.o. MRN#: [id number] Attending Physician: Antonieta Pert, MD Primary Care Physician: Carollee Herter, Alferd Apa, DO Admit Date: 10/06/2022 Length of Stay: 27 days  PMT re-consulted due to patient's medical status. Patient has had prolonged hospitalization and has been admitted since 10/20/2022. PMT had signed off on 12/11 after discussion with hospitalist as Dr. Marin Olp continues to discuss goals for medical care with patient and family. As per Dr. Antonieta Pert note on 12/19, patient and daughter still wishing to be somewhat aggressive with patient's care. Appreciate Dr. Antonieta Pert continued conversations with patient and family as they respect his opinion. Should there be a family meeting or family want to speak with PMT, we are happy to reengage. Please reach out if we can be of assistance. Thank you.     Chelsea Aus, DO Palliative Care Provider PMT # [phone]

## 2022-11-21 NOTE — Care Plan (Signed)
Came and saw patient again, spoke w/ daughter and RN

## 2022-11-21 NOTE — Progress Notes (Signed)
Hypoglycemic Event  CBG: 47  Treatment: 8 oz juice/soda  Symptoms: Pale  Follow-up CBG: [id number] CBG Result:79  Possible Reasons for Event: Inadequate meal intake  Comments/MD notified:Dr. [place] notified, see new orders.    Dorena Bodo

## 2022-11-21 NOTE — Progress Notes (Signed)
Unfortunately, Margaret Dyer certainly is more lethargic this morning.  She had a rough night.  I think she has had problems with impaction.  I will know she has had issues with diarrhea now.  We do have her on a bowel regimen to try to help get her going.  She is developing some skin breakdown on the backside.  I think that she probably would benefit from a air mattress.  Hopefully she can get an air mattress bed.  She just is little bit confused this morning.  She is afebrile.  Her white cell count is elevated 18.2 now.  She has a white cell count is 8.9.  They went on to get her on antibiotics.  The chest x-ray still showed some pulmonary infiltrates.  I do not think she is eating much.  She has had a little bit of nausea but no vomiting.  Her BUN is 87 creatinine 1.42.  I think we can probably hold on the Zaroxolyn.  The BUN might be up secondary to some hypovolemia.  Her albumin is only 2.7.  Her last prealbumin was 19.  Patient did have an abdominal x-ray.  I do not think this was all that remarkable.  I think she had some distention of the rectum.  Her white cell count is 18.2.  Hemoglobin 10.8.  Platelet count 66,000.  She is doing radiation therapy for the L4 lesion.  I think she is done pretty well with this.  Again, I worry that she just might not be able to improve.  I talked to her daughter this morning.  I gave her my thoughts as to what is going on.  Her daughter has several concerns.  I think this has to be taken up with the nurse manager.  Her vital signs show temperature of 97.7.  Pulse 70.  Blood pressure 99/46.  Her lungs do sound a little congested.  She has some crackles and rhonchi bilaterally.  She does have decent air movement.  Cardiac exam regular rate and rhythm.  Abdomen is soft.  Bowel sounds are present.  There is no guarding or rebound tenderness.  There is no palpable hepatosplenomegaly.  Neurological exam shows some lethargy.  She has little bit of confusion.  I  do not know if she has any obvious infection.  Again the white cell count is elevated.  I think she probably needs to be back on antibiotics.  Her daughter still wishes to be somewhat aggressive with her care.  As such, I think we have to treat her for a potential infection.  I do think an air mattress bed would be helpful for her.  I know this is an incredibly complicated.  I know the staff is doing a tremendous job on 6 E.  [name], MD  Margaret Dyer 4:4-5

## 2022-11-21 NOTE — Progress Notes (Addendum)
Pharmacy Antibiotic Note  Margaret Dyer is a 87 y.o. female with metastatic hepatocellular carcinoma with Pathological L4 fracture w/ concern for metastasis with intractable back pain from l4 fracture Innumerable pulmonary metastasis, history of renal cell carcinoma with left nephrectomy admitted on 11/01/2022 now with concern for intra-abdominal infection, PNA.  Pharmacy has been consulted for Vancomycin dosing.  Plan: Zosyn 3.375gm IV q8h (4hr extended infusions) Vancomycin 1g IV q48h for estimated AUC 460 using SCr 1.42, Vd 0.5 Check vancomycin levels at steady state if continues, goal AUC 400-550 Follow up renal function & cultures   Height: 5' 3" (160 cm) Weight: 89.5 kg (197 lb 5 oz) IBW/kg (Calculated) : 52.4  Temp (24hrs), Avg:97.9 F (36.6 C), Min:97.6 F (36.4 C), Max:98.5 F (36.9 C)  Recent Labs  Lab 11/17/22 0555 11/18/22 0555 11/19/22 0522 11/20/22 0536 11/21/22 0601  WBC 5.9 7.5 7.7 8.2 18.2*  CREATININE 1.28* 1.29* 1.22* 1.26* 1.42*    Estimated Creatinine Clearance: 30.7 mL/min (A) (by C-G formula based on SCr of 1.42 mg/dL (H)).    Allergies  Allergen Reactions   Other Other (See Comments)    Patient has hemorrhaged at least 4 times in her lifetime   Cefuroxime Axetil Nausea Only        Lisinopril Cough   Oxycodone Other (See Comments)    Confusion   Bactrim [Sulfamethoxazole-Trimethoprim] Other (See Comments)    Made her very sick, that's all she remembers   Ciprofloxacin Nausea Only    Antimicrobials this admission: 12/9 Azithromycin >> 12/13 12/19 Cefepime>> 12/19 Zosyn x 1 12/19 Flagyl >> 12/19 Vancomycin >>  Dose adjustments this admission:  Microbiology results: 11/25 UCx: NGF 12/4 RVP: neg 12/19 BCx: 12/19 MRSA PCR:  Thank you for allowing pharmacy to be a part of this patient's care.  Peggyann Juba, PharmD, BCPS Pharmacy: [phone] 11/21/2022 7:49 AM

## 2022-11-22 ENCOUNTER — Telehealth: Payer: Self-pay

## 2022-11-22 ENCOUNTER — Ambulatory Visit: Payer: Medicare Other

## 2022-11-22 DIAGNOSIS — R52 Pain, unspecified: Secondary | ICD-10-CM | POA: Diagnosis not present

## 2022-11-22 LAB — CBC WITH DIFFERENTIAL/PLATELET
Abs Immature Granulocytes: 0.09 K/uL — ABNORMAL HIGH (ref 0.00–0.07)
Basophils Absolute: 0 K/uL (ref 0.0–0.1)
Basophils Relative: 0 %
Eosinophils Absolute: 0 K/uL (ref 0.0–0.5)
Eosinophils Relative: 0 %
HCT: 32.8 % — ABNORMAL LOW (ref 36.0–46.0)
Hemoglobin: 10.1 g/dL — ABNORMAL LOW (ref 12.0–15.0)
Immature Granulocytes: 1 %
Lymphocytes Relative: 2 %
Lymphs Abs: 0.2 K/uL — ABNORMAL LOW (ref 0.7–4.0)
MCH: 29.5 pg (ref 26.0–34.0)
MCHC: 30.8 g/dL (ref 30.0–36.0)
MCV: 95.9 fL (ref 80.0–100.0)
Monocytes Absolute: 1.7 K/uL — ABNORMAL HIGH (ref 0.1–1.0)
Monocytes Relative: 14 %
Neutro Abs: 10 K/uL — ABNORMAL HIGH (ref 1.7–7.7)
Neutrophils Relative %: 83 %
Platelets: 53 K/uL — ABNORMAL LOW (ref 150–400)
RBC: 3.42 MIL/uL — ABNORMAL LOW (ref 3.87–5.11)
RDW: 19.9 % — ABNORMAL HIGH (ref 11.5–15.5)
WBC: 12 K/uL — ABNORMAL HIGH (ref 4.0–10.5)
nRBC: 0 % (ref 0.0–0.2)

## 2022-11-22 LAB — COMPREHENSIVE METABOLIC PANEL WITH GFR
ALT: 15 U/L (ref 0–44)
AST: 35 U/L (ref 15–41)
Albumin: 2.7 g/dL — ABNORMAL LOW (ref 3.5–5.0)
Alkaline Phosphatase: 63 U/L (ref 38–126)
Anion gap: 7 (ref 5–15)
BUN: 80 mg/dL — ABNORMAL HIGH (ref 8–23)
CO2: 32 mmol/L (ref 22–32)
Calcium: 7.3 mg/dL — ABNORMAL LOW (ref 8.9–10.3)
Chloride: 103 mmol/L (ref 98–111)
Creatinine, Ser: 1.37 mg/dL — ABNORMAL HIGH (ref 0.44–1.00)
GFR, Estimated: 38 mL/min — ABNORMAL LOW
Glucose, Bld: 143 mg/dL — ABNORMAL HIGH (ref 70–99)
Potassium: 4.1 mmol/L (ref 3.5–5.1)
Sodium: 142 mmol/L (ref 135–145)
Total Bilirubin: 1.2 mg/dL (ref 0.3–1.2)
Total Protein: 5.3 g/dL — ABNORMAL LOW (ref 6.5–8.1)

## 2022-11-22 LAB — GLUCOSE, CAPILLARY
Glucose-Capillary: 113 mg/dL — ABNORMAL HIGH (ref 70–99)
Glucose-Capillary: 123 mg/dL — ABNORMAL HIGH (ref 70–99)
Glucose-Capillary: 130 mg/dL — ABNORMAL HIGH (ref 70–99)
Glucose-Capillary: 135 mg/dL — ABNORMAL HIGH (ref 70–99)
Glucose-Capillary: 137 mg/dL — ABNORMAL HIGH (ref 70–99)
Glucose-Capillary: 137 mg/dL — ABNORMAL HIGH (ref 70–99)
Glucose-Capillary: 140 mg/dL — ABNORMAL HIGH (ref 70–99)

## 2022-11-22 LAB — PROCALCITONIN: Procalcitonin: 2.17 ng/mL

## 2022-11-22 MED ORDER — SODIUM CHLORIDE 0.9% FLUSH
10.0000 mL | Freq: Two times a day (BID) | INTRAVENOUS | Status: DC
  Administered 2022-11-23 – 2022-11-27 (×2): 10 mL

## 2022-11-22 MED ORDER — POLYETHYLENE GLYCOL 3350 17 G PO PACK
17.0000 g | PACK | Freq: Two times a day (BID) | ORAL | Status: DC
  Administered 2022-11-22: 17 g via ORAL
  Filled 2022-11-22 (×2): qty 1

## 2022-11-22 MED ORDER — CARVEDILOL 3.125 MG PO TABS: 3.1250 mg | ORAL_TABLET | Freq: Two times a day (BID) | ORAL | Status: DC

## 2022-11-22 MED ORDER — IPRATROPIUM-ALBUTEROL 0.5-2.5 (3) MG/3ML IN SOLN
3.0000 mL | Freq: Three times a day (TID) | RESPIRATORY_TRACT | Status: DC
  Administered 2022-11-23: 3 mL via RESPIRATORY_TRACT
  Filled 2022-11-22: qty 3

## 2022-11-22 MED ORDER — BISACODYL 10 MG RE SUPP
10.0000 mg | Freq: Every day | RECTAL | Status: DC
  Administered 2022-11-22: 10 mg via RECTAL
  Filled 2022-11-22: qty 1

## 2022-11-22 MED ORDER — ORAL CARE MOUTH RINSE: 15.0000 mL | OROMUCOSAL | Status: DC | PRN

## 2022-11-22 MED ORDER — SODIUM CHLORIDE 0.9% FLUSH
10.0000 mL | INTRAVENOUS | Status: DC | PRN
  Administered 2022-11-22: 10 mL

## 2022-11-22 NOTE — Progress Notes (Signed)
This RN assessed patient and attempted to disimpact per the MD order. Pt unable to tolerate disimpaction and only a small amount of soft stool removed. Dr. Lupita Leash made aware and new orders received. Quinn Bartling, Laurel Dimmer, RN

## 2022-11-22 NOTE — Progress Notes (Addendum)
PROGRESS NOTE Margaret Dyer  WUJ:[id number] DOB: 1935-05-23 DOA: 10/08/2022 PCP: Ann Held, DO   Brief Narrative/Hospital Course: 87 year old with history of DM2, HTN, HLD, diastolic CHF, pulmonary HTN, cirrhosis, CKD stage IIIa, renal cell carcinoma with left nephrectomy in 2000, hypothyroidism presented to hospital with intractable back pain currently admitted with working diagnosis of metastatic hepatic cellular carcinoma, respiratory failure.  Her CT scan showed compression fracture of L4 with innumerable bilateral pulmonary metastases, posterior left acetabular wall fracture.  MRI confirmed these findings and patient was admitted to the hospital.  Radiology and oncology were consulted.  IR procedure for kyphoplasty or biopsy was postponed as patient was unable to lay flat.  Patient was transferred to [facility] for radiation treatments.  Since being here patient is getting pain control, Chemo-Port placed on 12/8. Hospital course complicated by hyperkalemia.  Due to some respiratory distress, CT chest was done which showed signs of volume overload, some reduction in pulmonary nodules.  Her respiratory status,  hydration and renal function has been very tenuous therefore off-and-on requiring IV fluids and diuretics.  12/13: US shows small volume ascites, patient continues to remain short of breath placed on scheduled Lasix iv. 12/18: Patient having ongoing rectal pain after disimpaction.  Having some mild loose stool after laxatives, continues to complain of shortness of breath. 12/19: Now more lethargic hypoglycemic blood pressure soft and with worsening leukocytosis started on antibiotic for pneumonia coverage.  CT abdomen with fecal impaction sterocolitis/urine retention  Subjective: Seen and examined Pretty lethargic this am- but on disimpaction attempts starting to wake up more Soft stool noted in disimpaction Overnight patient afebrile BP in 1 1 10-1 30s, on 4 L nasal  cannula Labs pending this morning Disimpaction not done yesterday as she was reluctant Labs subsequently resulted creatinine stable improving leukocytosis downtrending  Assessment and Plan: Metastatic hepatocellular carcinoma Pathological L4 fracture w/ concern for metastasis Intractable back pain from l4 fracture Innumerable pulmonary metastasis History of renal cell carcinoma with left nephrectomy: Evaluated by oncology Dr. Marin Olp on board with extensive workup including:MRI, CT of the pelvis, w/ 30% vertebral height loss L4.pain from L4 improved kyphoplasty canceled.  Initially kyphoplasty and biopsy not done as she was unable to tolerate due to pain and discomfort. Oncology suspecting metastatic hepatocellular carcinoma due to elevated AFP >80k.S/P liver biopsy-showed cirrhosis but no malignant disease. s/p chemo-Port placement, xrt started 12/8 and to cont through 12/27 for total 10 treatment.  Continues to have ongoing pain this is being managed with multiple narcotics but very lethargic this morning discontinue fentanyl patch.  Continue Tylenol/tramadol as tolerated minimize opiates due to her encephalopathy lethargy.  Patient has not progressed much at this time she is likely appropriate for hospice, Dr. Marin Olp following closely palliative care has been consulted.  Fecal impaction with stercoral proctocolitis: Constipation/rectal pain ; Status post disimpaction few days ago, again disimpacted this morning having soft stool, Dulcolax inserted.  GI has been consulted, this morning leukocytosis better, on cefepime/Flagyl.  Minimize narcotics but unfortunately she is needing pain management  Moderate to marked bladder distention and urine retention due to fecal impaction: Foley inserted 12/19  Pneumonia: completed meropenem 12/1-12/5 Patchy bilateral pulmonary infiltrates on new cxr, concerning for pneumonia, given bump in WBC count elevated procalcitonin: MRSA screen negative discontinue  vancomycin given patient's CKD status, continue cefepime/Flagyl, follow-up blood culture.  BP is soft hydralazine has been discontinued/Coreg decreased.  Type 2 diabetes mellitus with hypoglycemia 12/19 am, previously hyperglycemic: Blood sugar stabilized, off long-acting  insulin and Premeal insulin keep on SSI for now, continue D5 NS > decrease rate. Cont Decadron. Recent Labs  Lab 11/21/22 1640 11/21/22 2103 11/22/22 0033 11/22/22 0426 11/22/22 0718  GLUCAP 169* 137* 135* 140* 137*    Acute on chronic hypoxic respiratory failure Shortness of breath ongoing Grade 3 Diastolic dysfunction suspect acute on chronic disease/CHF Anasarca with leg edema/bilateral pleural effusion Cirrhosis>liver biopsy showed Cirrhosis (stage 4 of 4) of uncertain etiology, mild active steatohepatitis Small volume ascites on ultrasound 12/14 Bilateral pleural effusions:  Chronically complaining of shortness of breath, chronic respite status overall appears stable mild leg edema present-Suspected to be multifactorial from CHF COPD pneumonia anasarca coupled w/ deconditioning/debility and metastatic disease.CT chest 12/9: Moderate bilateral pleural effusion, pulmonary nodules, small volume ascites, cirrhosis, cardiomegaly,  BNP was 879,Procalcitonin 0.5.Echo  12/13>EF 60 to 65%, G3 DD restrictive, moderately elevated PASP.  Given IV Lasix for several days overall weight has improved.  Lasix has been discontinued due to soft blood pressure lethargy, needing IV fluids for hypoglycemia and decreased oral intake.  Monitor fluid status and reassess for Lasix need to closelyNet IO Since Admission: 455.55 mL [11/22/22 0933] Filed Weights   11/20/22 0500 11/21/22 0628 11/22/22 0629  Weight: 94.6 kg 89.5 kg 105.2 kg   COPD:on home O2 3.5 l> here needing 3-4l [place].continue bronchodilators scheduled and as needed, cont supplemental oxygen.  Hyperkalemia: Improved tolerating diuretics.Lokelma discontinued 12/15.   Monitor Hypocalcemia stable, monitor  CKD stage IV, history of left nephrectomy: Baseline creatinine close to 1.7 currently improved to 1.2 > 1.4. monitor inv ivf. Off  lasix 12/19.  Avoid hypotension Recent Labs    11/13/22 0523 11/14/22 0500 11/15/22 0245 11/16/22 0530 11/17/22 0555 11/18/22 0555 11/19/22 0522 11/20/22 0536 11/21/22 0601 11/22/22 0904  BUN 72* 84* 71* 71* 74* 78* 74* 75* 87* 80*  CREATININE 1.65* 1.44* 1.41* 1.41* 1.28* 1.29* 1.22* 1.26* 1.42* 1.37*   Chronic anxiety disorder on ativan: daughter requesting to schedule her Ativan to 0.5 mg nighttime   Posterior left acetabular wall fracture of indeterminate age: Noted on the CT of the hip with a small fracture 11/22, seen by [facility], recommended PWB with assistance of a walker, Follow in office as outpt 3 weeks for repeat xrays   Klebsiella UTI: Completed antibiotics  Hypertension:BP soft> decreased Coreg dose from 12.5 > 3.125, discontinued hydralazine.  Monitor   HLD stable continue Zetia and Zocor Hypothyroidism continue Synthroid    Thrombocytopenia, chronic:Does has ecchymosis,but no overt bleeding.hematology monitoring.  Platelet 15 Recent Labs  Lab 11/18/22 0555 11/19/22 0522 11/20/22 0536 11/21/22 0601 11/22/22 0904  PLT 91* 81* 71* 66* 53*    Failure to thrive Goals of care: Appears debilitated, deconditioned, prognosis appears poor, discussed with oncology.  Palliative care has been consulted.continue to monitor closely.  She is DNI.  I had extensive discussion with patient as well as patient's daughter-she understands the reality of how things are going but she is optimistic she says-she is aware patient is not improving.  Patient has not improved and continues to decline-hospice recommended per oncology.  She is at risk of decompensation.  Unfortunately we had to discontinue fentanyl patch today due to lethargy minimize narcotics, she will likely will start having worsening pain.  I  encouraged patient to discuss with her daughters  Addendum:I met with patient's daughters, extensively discussed her overall clinical suggestion that she is not improving.They would like to see how she does in few days but they are optimistic.I brought the idea  of palliative care or hospice care. I spoke with Dr. Marin Olp he recommends hospice care and also spoke with palliative medicine team.Will await Dr. Marin Olp to discuss with family further if she does not improve in the next 2 days.   Increased nutrition need continue dronabinol, augment and nutrition supplement as below Nutrition Problem: Increased nutrient needs Etiology: acute illness, chronic illness Signs/Symptoms: estimated needs Interventions: Ensure Enlive (each supplement provides 350kcal and 20 grams of protein), Refer to RD note for recommendations   Class II Obesity:Patient's Body mass index is 41.1 kg/m. : Will benefit with PCP follow-up, weight loss  healthy lifestyle and outpatient sleep evaluation.  DVT prophylaxis: heparin injection 5,000 Units Start: 10/25/22 1400 SCDs Start: 10/25/22 0220 Code Status:   Code Status: Partial Code Family Communication: plan of care discussed with patient at bedside.  I had updated patient's daughter at the bedside multiple times. No family at the bedside today.    Patient status is: Inpatient because of ongoing need for radiation therapy inpatient Level of care: Progressive  Dispo: The patient is from: Lives at daughter's home            Anticipated disposition: TBD Objective: Vitals last 24 hrs: Vitals:   11/21/22 2344 11/22/22 0429 11/22/22 0629 11/22/22 0858  BP: (!) 126/51 (!) 136/48  (!) 112/54  Pulse: 61 (!) 59  68  Resp: _0 Temp: 98.1 F (36.7 C) (!) 97.3 F (36.3 C)  98.7 F (37.1 C)  TempSrc: Oral Oral  Oral  SpO2: 98% 97%  97%  Weight:   105.2 kg   Height:       Weight change: 15.7 kg  Physical Examination: General exam: Lethargic, able to open eyes  more alert awake after disimpaction HEENT:Oral mucosa moist, Ear/Nose WNL grossly, dentition normal. Respiratory system: bilaterally diminished BS, no use of accessory muscle Cardiovascular system: S1 & S2 +, regular rate, JVD neg. Gastrointestinal system: Abdomen soft,NT,ND,BS+ Nervous System: Lethargic, moving all extremities  Extremities: LE ankle edema +, lower extremities warm Skin: No rashes,no icterus. MSK: weak muscle, low bulk,tone, power   Medications reviewed:  Scheduled Meds:  albuterol  2.5 mg Nebulization BID   carvedilol  6.25 mg Oral BID WC   Chlorhexidine Gluconate Cloth  6 each Topical Daily   dexamethasone  4 mg Oral Daily   docusate sodium  100 mg Oral BID   dronabinol  2.5 mg Oral QAC lunch   feeding supplement  237 mL Oral TID BM   fentaNYL  1 patch Transdermal Q72H   fluconazole  100 mg Oral Daily   heparin  5,000 Units Subcutaneous Q8H   hydrALAZINE  25 mg Oral Q8H   insulin aspart  0-5 Units Subcutaneous QHS   insulin aspart  0-9 Units Subcutaneous TID WC   levothyroxine  25 mcg Oral Q MTWThF   LORazepam  0.5 mg Oral QHS   multivitamin with minerals  1 tablet Oral Daily   oxybutynin  5 mg Oral Daily   sodium chloride flush  10-40 mL Intracatheter Q12H  Continuous Infusions:  sodium chloride     ceFEPime (MAXIPIME) IV 2 g (11/22/22 0419)   dextrose 5 % and 0.9% NaCl 50 mL/hr at 11/22/22 0418   metronidazole 500 mg (11/22/22 1610)   vancomycin 1,000 mg (11/21/22 1038)    Diet Order             Diet regular Room service appropriate? Yes; Fluid consistency: Thin  Diet effective now  Intake/Output Summary (Last 24 hours) at 11/22/2022 0933 Last data filed at 11/22/2022 0906 Gross per 24 hour  Intake 1551.34 ml  Output 1100 ml  Net 451.34 ml  Net IO Since Admission: 455.55 mL [11/22/22 0933]  Wt Readings from Last 3 Encounters:  11/22/22 105.2 kg  10/20/22 82.9 kg  09/04/22 85.8 kg    Unresulted Labs (From admission,  onward)     Start     Ordered   11/21/22 2583  Culture, Respiratory w Gram Stain  Once,   R       Question:  Patient immune status  Answer:  Immunocompromised   11/21/22 0717   11/21/22 0500  CBC with Differential/Platelet  Daily,   R     Question:  Specimen collection method  Answer:  Unit=Unit collect   11/20/22 0712   11/21/22 0500  Procalcitonin  Daily,   R     Question:  Specimen collection method  Answer:  Unit=Unit collect   11/20/22 1617          Data Reviewed: I have personally reviewed following labs and imaging studies CBC: Recent Labs  Lab 11/18/22 0555 11/19/22 0522 11/20/22 0536 11/21/22 0601 11/22/22 0904  WBC 7.5 7.7 8.2 18.2* 12.0*  NEUTROABS  --   --   --  15.2* 10.0*  HGB 10.5* 10.4* 10.4* 10.8* 10.1*  HCT 33.9* 33.7* 33.8* 35.4* 32.8*  MCV 94.2 96.6 95.8 95.2 95.9  PLT 91* 81* 71* 66* 53*   Basic Metabolic Panel: Recent Labs  Lab 11/18/22 0555 11/19/22 0522 11/20/22 0536 11/21/22 0601 11/22/22 0904  NA 140 139 140 141 142  K 4.4 4.8 4.7 5.0 4.1  CL 100 98 98 98 103  CO2 32 32 34* 35* 32  GLUCOSE 165* 174* 153* 54* 143*  BUN 78* 74* 75* 87* 80*  CREATININE 1.29* 1.22* 1.26* 1.42* 1.37*  CALCIUM 7.9* 7.7* 7.7* 7.8* 7.3*  GFR: Estimated Creatinine Clearance: 34.8 mL/min (A) (by C-G formula based on SCr of 1.37 mg/dL (H)). Liver Function Tests: Recent Labs  Lab 11/18/22 0555 11/19/22 0522 11/20/22 0536 11/21/22 0601 11/22/22 0904  AST 32 30 31 34 35  ALT _0 ALKPHOS 74 68 70 68 63  BILITOT 0.9 0.8 0.9 1.0 1.2  PROT 5.9* 5.6* 5.7* 5.4* 5.3*  ALBUMIN 3.3* 3.0* 3.2* 2.7* 2.7*   Recent Results (from the past 240 hour(s))  Culture, blood (Routine X 2) w Reflex to ID Panel     Status: None (Preliminary result)   Collection Time: 11/21/22  7:33 AM   Specimen: BLOOD RIGHT ARM  Result Value Ref Range Status   Specimen Description   Final    BLOOD RIGHT ARM Performed at [facility] [address],  [place], [place] [address]    Special Requests   Final    IN PEDIATRIC BOTTLE Blood Culture adequate volume Performed at [facility] [address] [place], [place] [address]    Culture   Final    NO GROWTH < 24 HOURS Performed at [facility] [address], [place], [place] [address]    Report Status PENDING  Incomplete  Culture, blood (Routine X 2) w Reflex to ID Panel     Status: None (Preliminary result)   Collection Time: 11/21/22  7:33 AM   Specimen: BLOOD RIGHT HAND  Result Value Ref Range Status   Specimen Description   Final    BLOOD RIGHT HAND  Performed at [facility], [place] [address], [place], [place] [address]    Special Requests   Final    IN PEDIATRIC BOTTLE Blood Culture adequate volume Performed at [facility] [address] [place], [place] [address]    Culture   Final    NO GROWTH < 24 HOURS Performed at [facility] [address], [place], [place] [address]    Report Status PENDING  Incomplete  MRSA Next Gen by PCR, Nasal     Status: None   Collection Time: 11/21/22 11:03 AM   Specimen: Nasal Mucosa; Nasal Swab  Result Value Ref Range Status   MRSA by PCR Next Gen NOT DETECTED NOT DETECTED Final    Comment: (NOTE) The GeneXpert MRSA Assay (FDA approved for NASAL specimens only), is one component of a comprehensive MRSA colonization surveillance program. It is not intended to diagnose MRSA infection nor to guide or monitor treatment for MRSA infections. Test performance is not FDA approved in patients less than 66 years old. Performed at [facility], [place] [address] [place],  [address]     Antimicrobials: Anti-infectives (From admission, onward)    Start     Dose/Rate Route Frequency Ordered Stop   11/21/22 1700  metroNIDAZOLE (FLAGYL) IVPB 500 mg        500 mg 100 mL/hr over 60 Minutes Intravenous Every 12 hours 11/21/22 0816     11/21/22 1600   ceFEPIme (MAXIPIME) 2 g in sodium chloride 0.9 % 100 mL IVPB        2 g 200 mL/hr over 30 Minutes Intravenous Every 12 hours 11/21/22 0816     11/21/22 1000  vancomycin (VANCOCIN) IVPB 1000 mg/200 mL premix        1,000 mg 200 mL/hr over 60 Minutes Intravenous Every 48 hours 11/21/22 0807     11/21/22 0900  piperacillin-tazobactam (ZOSYN) IVPB 3.375 g  Status:  Discontinued        3.375 g 12.5 mL/hr over 240 Minutes Intravenous Every 8 hours 11/21/22 0748 11/21/22 0816   11/21/22 0815  ceFEPIme (MAXIPIME) 2 g in sodium chloride 0.9 % 100 mL IVPB  Status:  Discontinued        2 g 200 mL/hr over 30 Minutes Intravenous Every 12 hours 11/21/22 0727 11/21/22 0748   11/16/22 1000  fluconazole (DIFLUCAN) tablet 100 mg        100 mg Oral Daily 11/16/22 0658     11/13/22 1000  azithromycin (ZITHROMAX) tablet 500 mg  Status:  Discontinued        500 mg Oral Daily 11/13/22 0732 11/16/22 0722   11/11/22 0800  azithromycin (ZITHROMAX) 500 mg in sodium chloride 0.9 % 250 mL IVPB  Status:  Discontinued        500 mg 250 mL/hr over 60 Minutes Intravenous Every 24 hours 11/11/22 0717 11/13/22 0732   11/20/2022 0800  meropenem (MERREM) 500 mg in sodium chloride 0.9 % 100 mL IVPB        500 mg 200 mL/hr over 30 Minutes Intravenous 2 times daily 11/06/2022 0637 11/07/22 2316   11/02/22 0800  vancomycin (VANCOCIN) IVPB 1000 mg/200 mL premix        1,000 mg 200 mL/hr over 60 Minutes Intravenous To Radiology 11/02/22 0710 11/17/2022 0800   10/25/22 0715  cefTRIAXone (ROCEPHIN) 1 g in sodium chloride 0.9 % 100 mL IVPB  Status:  Discontinued        1 g 200  mL/hr over 30 Minutes Intravenous Every 24 hours 10/25/22 0701 10/27/22 1515      Culture/Microbiology    Component Value Date/Time   SDES  11/21/2022 0733    BLOOD RIGHT ARM Performed at [facility], [place] [address], [place], [place] [address]    SDES  11/21/2022 [phone]    BLOOD RIGHT HAND Performed at [facility],  [place] [address] [place], [place] [address]    SPECREQUEST  11/21/2022 [phone]    IN PEDIATRIC BOTTLE Blood Culture adequate volume Performed at [facility] [address] [place], [place] [address]    SPECREQUEST  11/21/2022 [phone]    IN PEDIATRIC BOTTLE Blood Culture adequate volume Performed at [facility] [address], [place], [place] [address]    CULT  11/21/2022 [phone]    NO GROWTH < 24 HOURS Performed at [facility] [address] [place], [place] [address]    CULT  11/21/2022 [phone]    NO GROWTH < 24 HOURS Performed at [facility], [place] [address], [place], [place] [address]    REPTSTATUS PENDING 11/21/2022 2536   REPTSTATUS PENDING 11/21/2022 6440   Radiology Studies: CT ABDOMEN PELVIS WO CONTRAST  Addendum Date: 11/21/2022   ADDENDUM REPORT: 11/21/2022 17:02 ADDENDUM: For clarification there was perihepatic ascites seen at the inferior margin of the chest CT in the upper abdomen. This appears to have increased since previous imaging. This is new compared to imaging from November. Imaging findings were discussed with the provider as outlined below. These results were called by telephone at the time of interpretation on 11/21/2022 at 5:02 pm to provider [facility] , who verbally acknowledged these results. Electronically Signed   By: Zetta Bills M.D.   On: 11/21/2022 17:02   Result Date: 11/21/2022 CLINICAL DATA:  A female at age 49 presents for evaluation of abdominal pain in the setting of metastatic renal cell carcinoma and cirrhosis. EXAM: CT ABDOMEN AND PELVIS WITHOUT CONTRAST TECHNIQUE: Multidetector CT imaging of the abdomen and pelvis was performed following the standard protocol without IV contrast. RADIATION DOSE REDUCTION: This exam was performed according to the departmental dose-optimization program which includes automated exposure control, adjustment of the mA and/or kV according to patient size and/or use of  iterative reconstruction technique. COMPARISON:  Previous imaging from October 25, 2022 and abdominal sonogram from October 28, 2022. FINDINGS: Lower chest: Incidental imaging of the lung bases with cardiomegaly, heart is incompletely imaged. Decreasing bilateral pleural effusions since previous imaging and decreasing basilar consolidative change still with small RIGHT and trace LEFT effusions and moderate consolidative process at the RIGHT greater than LEFT lung base. Pulmonary metastatic lesions are not as well delineated on current imaging as on previous imaging likely mixed with volume loss at the RIGHT lung base. No chest wall abnormality aside from bilateral flank and chest wall edema in the setting of developing anasarca. Hepatobiliary: Hepatic lesions similar to previous imaging on a background of marked hepatic cirrhosis. Lesion in the posterior RIGHT hemiliver measures as much as 5.3 x 4.4 cm perhaps even slightly larger on some images up to 5.6 cm. Small lesion in the LEFT hepatic lobe (image 15/2) this measures 8 mm. Gallbladder is distended as on previous imaging. Interval development of perihepatic and generalized abdominal ascites since prior imaging. Fluid in the abdomen shows low-density. No gross biliary duct distension. Pancreas: Pancreatic atrophy without acute process. Spleen: Splenomegaly, spleen measuring up to 14 cm greatest  craniocaudal dimension. Adrenals/Urinary Tract: Post LEFT nephrectomy. Nodularity in the LEFT nephrectomy bed 2.3 cm unchanged. Stable low-density nodularity of the RIGHT adrenal gland is unchanged. There is marked renal cortical scarring on the RIGHT. There is no RIGHT-sided hydronephrosis. Urinary bladder without focal thickening or stranding on noncontrast imaging. Moderate to marked distension of the urinary bladder may be related to some form of bladder outlet obstruction in the setting of fecal impaction. Stomach/Bowel: Moderate rectal distension with perirectal  stranding and mild rectal thickening 7 cm greatest dimension. Sigmoid thickening and adjacent stranding. No signs of bowel obstruction currently. Vascular/Lymphatic: Aortic atherosclerosis. No sign of aneurysm. Smooth contour of the IVC. There is no gastrohepatic or hepatoduodenal ligament lymphadenopathy. No retroperitoneal or mesenteric lymphadenopathy. No pelvic sidewall lymphadenopathy. Reproductive: Post hysterectomy. Other: Small volume ascites developed since previous imaging. Musculoskeletal: Unchanged appearance of pathologic fracture of the L4 vertebral body approximally 20-30% loss of height of the superior endplate. No change in spinal alignment. Lucency in the L5 vertebral body. IMPRESSION: 1. Fecal impaction with stercoral proctocolitis. Given degree of sigmoid thickening would also correlate with any signs of superimposed infectious colitis such as C difficile. 2. Moderate to marked urinary bladder distension could be related to fecal impaction and mass effect upon the bladder base. Correlate with signs of bladder outlet obstruction or urinary retention. 3. Interval development of perihepatic and generalized abdominal ascites since prior imaging. May reflect findings decompensation of hepatic cirrhosis or could be related to ongoing colitis. 4. Signs of cirrhosis and portal hypertension with hepatic masses largest in the posterior RIGHT hemiliver. This could reflect metastatic disease but given the degree of hepatic cirrhosis would also consider the possibility of primary hepatic neoplasm such as hepatocellular carcinoma. AFP correlation may be helpful with tissue sampling or hepatic MRI as possible. 5. Decreasing bilateral pleural effusions and basilar consolidative changes. 6. Unchanged appearance of pathologic fracture at L4. 7. Lucency in the L5 vertebral body is unchanged. 8. Signs of pulmonary metastatic disease not well evaluated. 9. Aortic atherosclerosis. Aortic Atherosclerosis (ICD10-I70.0).  Electronically Signed: By: Zetta Bills M.D. On: 11/21/2022 16:05   DG Chest 2 View  Result Date: 11/20/2022 CLINICAL DATA:  Pneumonia EXAM: CHEST - 2 VIEW COMPARISON:  11/17/2022 FINDINGS: RIGHT jugular Port-A-Cath with tip projecting over SVC. Enlargement of cardiac silhouette with pulmonary vascular congestion. Atherosclerotic calcifications and mild tortuosity of thoracic aorta. Patchy BILATERAL pulmonary infiltrates, greater in upper lobes, favor multifocal pneumonia. Decreased pleural effusion since previous exam. No pneumothorax. Bones demineralized. IMPRESSION: Patchy BILATERAL pulmonary infiltrates concerning for pneumonia. Enlargement of cardiac silhouette with pulmonary vascular congestion. Aortic Atherosclerosis (ICD10-I70.0). Electronically Signed   By: Lavonia Dana M.D.   On: 11/20/2022 16:06   DG Abd 1 View  Result Date: 11/20/2022 CLINICAL DATA:  Constipation, rectal pain EXAM: ABDOMEN - 1 VIEW COMPARISON:  Portable exam 1556 hours compared to 08/15/2016 FINDINGS: Mildly prominent retained contrast and stool in rectum. Gaseous distention of colon, mild. No evidence of small bowel obstruction or wall thickening. Bones demineralized. Surgical clips LEFT mid abdomen. Osseous demineralization with prior proximal LEFT femoral ORIF. IMPRESSION: Mildly prominent stool and retained contrast in rectum. Mild diffuse gaseous distention of colon. Electronically Signed   By: Lavonia Dana M.D.   On: 11/20/2022 16:05     LOS: 28 days   Antonieta Pert, MD [facility]  11/22/2022, 9:33 AM
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
## 2022-11-22 NOTE — Telephone Encounter (Signed)
Patient currently inpatient and requested to cancel today's radiation treatment. Called placed to floor nurse to confirm. Per patient and daughters patient no longer wishes to continue treatments.  Patient has received 8 of 10 treatments to lumbar spine. Dr. Sondra Come made aware.

## 2022-11-22 NOTE — Plan of Care (Signed)
  Problem: Clinical Measurements: Goal: Will remain free from infection Outcome: Progressing   Problem: Elimination: Goal: Will not experience complications related to bowel motility Outcome: Progressing Goal: Will not experience complications related to urinary retention Outcome: Progressing   Problem: Pain Managment: Goal: General experience of comfort will improve Outcome: Progressing
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
## 2022-11-22 NOTE — Progress Notes (Signed)
This RN received message that healthcare POA called to request pt code status to be changed to DNR. This RN placed called to daughter, [name], to clarify if this was the request. Daughter had several questions regarding code status and the "best decision" for her mother. Dr. Lupita Leash made aware that family has questions about goals of care and code status. No new orders received at this time. Fong Mccarry, Laurel Dimmer, RN

## 2022-11-22 NOTE — Plan of Care (Signed)
  Problem: Activity: Goal: Risk for activity intolerance will decrease Outcome: Not Progressing   Problem: Nutrition: Goal: Adequate nutrition will be maintained Outcome: Not Progressing   Problem: Elimination: Goal: Will not experience complications related to bowel motility Outcome: Not Progressing

## 2022-11-22 NOTE — Progress Notes (Signed)
PT Cancellation Note  Patient Details Name: Margaret Dyer MRN: [id number] DOB: [birth year]   Cancelled Treatment:    Reason Eval/Treat Not Completed: Fatigue/lethargy limiting ability to participate; attempted though pt reported wanting to "be left alone".  Encouraged her nephew who was there to visit. Will continue attempts   Reginia Naas 11/22/2022, 5:36 PM Magda Kiel, PT [facility] [phone] 11/22/2022

## 2022-11-22 NOTE — Evaluation (Signed)
SLP Cancellation Note  Patient Details Name: Margaret Dyer MRN: [id number] DOB: 20-Dec-1934   Cancelled treatment:       Reason Eval/Treat Not Completed: Other (comment);Fatigue/lethargy limiting ability to participate (pt lethargic today and has fecal retention - with poor po, will reattempt next date for evaluation)  Kathleen Lime, MS [facility] SLP [place] Office [phone] Pager [phone] Macario Golds 11/22/2022, 1:06 PM

## 2022-11-23 ENCOUNTER — Ambulatory Visit: Payer: Medicare Other

## 2022-11-23 DIAGNOSIS — R52 Pain, unspecified: Secondary | ICD-10-CM | POA: Diagnosis not present

## 2022-11-23 LAB — COMPREHENSIVE METABOLIC PANEL
ALT: 12 U/L (ref 0–44)
AST: 27 U/L (ref 15–41)
Albumin: 2.4 g/dL — ABNORMAL LOW (ref 3.5–5.0)
Alkaline Phosphatase: 51 U/L (ref 38–126)
Anion gap: 7 (ref 5–15)
BUN: 79 mg/dL — ABNORMAL HIGH (ref 8–23)
CO2: 32 mmol/L (ref 22–32)
Calcium: 7.1 mg/dL — ABNORMAL LOW (ref 8.9–10.3)
Chloride: 107 mmol/L (ref 98–111)
Creatinine, Ser: 1.41 mg/dL — ABNORMAL HIGH (ref 0.44–1.00)
GFR, Estimated: 37 mL/min — ABNORMAL LOW (ref >60)
Glucose, Bld: 159 mg/dL — ABNORMAL HIGH (ref 70–99)
Potassium: 4.3 mmol/L (ref 3.5–5.1)
Sodium: 146 mmol/L — ABNORMAL HIGH (ref 135–145)
Total Bilirubin: 1 mg/dL (ref 0.3–1.2)
Total Protein: 4.8 g/dL — ABNORMAL LOW (ref 6.5–8.1)

## 2022-11-23 LAB — CBC WITH DIFFERENTIAL/PLATELET
Abs Immature Granulocytes: 0.07 10*3/uL (ref 0.00–0.07)
Basophils Absolute: 0 10*3/uL (ref 0.0–0.1)
Basophils Relative: 0 %
Eosinophils Absolute: 0 10*3/uL (ref 0.0–0.5)
Eosinophils Relative: 0 %
HCT: 31.9 % — ABNORMAL LOW (ref 36.0–46.0)
Hemoglobin: 9.4 g/dL — ABNORMAL LOW (ref 12.0–15.0)
Immature Granulocytes: 1 %
Lymphocytes Relative: 3 %
Lymphs Abs: 0.2 10*3/uL — ABNORMAL LOW (ref 0.7–4.0)
MCH: 29.2 pg (ref 26.0–34.0)
MCHC: 29.5 g/dL — ABNORMAL LOW (ref 30.0–36.0)
MCV: 99.1 fL (ref 80.0–100.0)
Monocytes Absolute: 0.9 10*3/uL (ref 0.1–1.0)
Monocytes Relative: 12 %
Neutro Abs: 6.5 10*3/uL (ref 1.7–7.7)
Neutrophils Relative %: 84 %
Platelets: 48 10*3/uL — ABNORMAL LOW (ref 150–400)
RBC: 3.22 MIL/uL — ABNORMAL LOW (ref 3.87–5.11)
RDW: 19.9 % — ABNORMAL HIGH (ref 11.5–15.5)
WBC: 7.7 10*3/uL (ref 4.0–10.5)
nRBC: 0 % (ref 0.0–0.2)

## 2022-11-23 LAB — GLUCOSE, CAPILLARY
Glucose-Capillary: 147 mg/dL — ABNORMAL HIGH (ref 70–99)
Glucose-Capillary: 138 mg/dL — ABNORMAL HIGH (ref 70–99)

## 2022-11-23 LAB — PREALBUMIN: Prealbumin: 9 mg/dL — ABNORMAL LOW (ref 18–38)

## 2022-11-23 MED ORDER — HALOPERIDOL LACTATE 5 MG/ML IJ SOLN: 0.5000 mg | INTRAMUSCULAR | Status: DC | PRN

## 2022-11-23 MED ORDER — HALOPERIDOL LACTATE 2 MG/ML PO CONC
0.5000 mg | ORAL | Status: DC | PRN
  Filled 2022-11-23: qty 5

## 2022-11-23 MED ORDER — POLYVINYL ALCOHOL 1.4 % OP SOLN
1.0000 [drp] | Freq: Four times a day (QID) | OPHTHALMIC | Status: DC | PRN
  Filled 2022-11-23: qty 15

## 2022-11-23 MED ORDER — HALOPERIDOL 0.5 MG PO TABS: 0.5000 mg | ORAL_TABLET | ORAL | Status: DC | PRN

## 2022-11-23 MED ORDER — LORAZEPAM 1 MG PO TABS: 1.0000 mg | ORAL_TABLET | ORAL | Status: DC | PRN

## 2022-11-23 MED ORDER — LORAZEPAM 2 MG/ML PO CONC
1.0000 mg | ORAL | Status: DC | PRN
  Administered 2022-11-23: 1 mg via SUBLINGUAL
  Filled 2022-11-23: qty 1

## 2022-11-23 MED ORDER — ACETAMINOPHEN 650 MG RE SUPP: 650.0000 mg | Freq: Four times a day (QID) | RECTAL | Status: DC | PRN

## 2022-11-23 MED ORDER — ACETAMINOPHEN 325 MG PO TABS: 650.0000 mg | ORAL_TABLET | Freq: Four times a day (QID) | ORAL | Status: DC | PRN

## 2022-11-23 MED ORDER — IPRATROPIUM-ALBUTEROL 0.5-2.5 (3) MG/3ML IN SOLN: 3.0000 mL | Freq: Four times a day (QID) | RESPIRATORY_TRACT | Status: DC | PRN

## 2022-11-23 MED ORDER — CHLORHEXIDINE GLUCONATE CLOTH 2 % EX PADS
6.0000 | MEDICATED_PAD | Freq: Every day | CUTANEOUS | Status: DC
  Administered 2022-11-23 – 2022-11-27 (×5): 6 via TOPICAL

## 2022-11-23 MED ORDER — SCOPOLAMINE 1 MG/3DAYS TD PT72
1.0000 | MEDICATED_PATCH | TRANSDERMAL | Status: DC
  Administered 2022-11-23 – 2022-11-26 (×2): 1.5 mg via TRANSDERMAL
  Filled 2022-11-23 (×2): qty 1

## 2022-11-23 MED ORDER — HYDROMORPHONE HCL 1 MG/ML IJ SOLN
1.0000 mg | INTRAMUSCULAR | Status: DC | PRN
  Administered 2022-11-23 – 2022-11-24 (×3): 1 mg via INTRAVENOUS
  Filled 2022-11-23 (×3): qty 1

## 2022-11-23 MED ORDER — LORAZEPAM 2 MG/ML IJ SOLN
1.0000 mg | INTRAMUSCULAR | Status: DC | PRN
  Administered 2022-11-26: 1 mg via INTRAVENOUS
  Filled 2022-11-23: qty 1

## 2022-11-23 MED ORDER — HYDROMORPHONE HCL 1 MG/ML IJ SOLN
1.0000 mg | INTRAMUSCULAR | Status: DC | PRN
  Administered 2022-11-23: 1 mg via SUBCUTANEOUS
  Filled 2022-11-23: qty 1

## 2022-11-23 NOTE — Progress Notes (Signed)
Chaplain engaged in a follow-up visit with Margaret Dyer's family.  Chaplain provided reflective listening as family engaged in storytelling about Margaret Dyer and their family.  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  She is the oldest of her siblings which made way for her to be strong and resilient.  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  She was very musically inclined.  She also deeply loves her grandchildren.                                 Chaplain to discuss spirituality and the afterlife with family.  Chaplain offered prayer and support.    11/23/22 1400  Clinical Encounter Type  Visited With Patient and family together  Visit Type Follow-up;Spiritual support;Patient actively dying  Spiritual Encounters  Spiritual Needs Prayer

## 2022-11-23 NOTE — Progress Notes (Addendum)
Unfortunately, I just do not see Margaret Dyer getting better.  She is weaker today.  Thankfully, she is in the air mattress.  This is helping her a little bit.  She still has his cough.  I does not think she is strong enough to bring anything up.  We decided not to pursue any further radiation.  I just do not think that this would be helpful for her at this point.  It would cause more aggravation trying to move her.  She is not eating.  I am not surprised by this.  Her platelet count is dropping.  Again I suspect this is part of her body trying to shut down.  Her albumin is going down quickly.  Is now 2.4.  I really think that we have to focus on her comfort.  I certainly would not be surprised if we had to get her on a low-dose of morphine drip for her lungs.  Again she sounds congested.  She just not able to bring up any secretions.  I am not sure if she really recognize who I was.  She slept most of the time.  She would open her eyes on occasion.  I do not think she has any issues with pain.  I spoke to her daughter this morning.  Her daughter feels that she just may not make it through the day.  I suppose I would not be too surprised by this.  Her daughter and I felt that a full DO NOT RESUSCITATE is appropriate at this point.  I certainly concur with that.  I think given her decline this week, I think that any resuscitation attempts would be futile and would cause a lot more discomfort and pain for Margaret Dyer and for her family.  As such, we are making her a DO NOT RESUSCITATE.    I reassured her daughter that Margaret Dyer would not be going anywhere.  She would not be discharged.  She would not be transferred to [facility].  She will stay where she is in view of the inevitable passing that will happen quickly in my opinion.  Again, I just want to make sure that Margaret Dyer is comfortable.  She has fought hard.  She just has a very extensive malignancy.  Given her age and the fact that she just was  not all that mobile because of the fracture of the L4, she just has not been able to be as active as necessary.  Again, I know she tried everything that we asked her to do.  Again I just suspect that she would not make it through the weekend.  She just has that appearance today.  Her vital signs show temperature of 98.  Pulse 63.  Blood pressure 131/81.  Her lungs do sound somewhat congested.  She does have some rhonchi bilaterally.  Cardiac exam is regular rate.  She has an occasional extra beat.  Abdomen is soft.  Bowel sounds are decreased.  Extremity shows no clubbing, cyanosis or edema.  Neurological exam shows overall lethargy.  Again, I suspect that we are probably not looking at more than for 5 days at this point.  I just want to make sure that she is comfortable.  She may benefit from a low-dose morphine infusion for her lungs.  Again it would not surprise me if she did not make it through the weekend.  I do appreciate the care that she has gotten.  Staff on 4 E. have done  a wonderful job with her.   [name], MD  2 Timothy 4:16

## 2022-11-23 NOTE — Progress Notes (Addendum)
PROGRESS NOTE    Margaret Dyer  XIP:[id number] DOB: 1935/10/23 DOA: 10/19/2022 PCP: Ann Held, DO    Brief Narrative:  87 year old with history of DM2, HTN, HLD, diastolic CHF, pulmonary HTN, cirrhosis, CKD stage IIIa, renal cell carcinoma with left nephrectomy in 2000, hypothyroidism presented to hospital with intractable back pain currently admitted with working diagnosis of metastatic hepatic cellular carcinoma, respiratory failure.   Her CT scan showed compression fracture of L4 with innumerable bilateral pulmonary metastases, posterior left acetabular wall fracture.  MRI confirmed these findings and patient was admitted to the hospital.  Radiology and oncology were consulted. Patient was transferred to [facility] for radiation treatments.  Since being here patient is getting pain control, Chemo-Port placed on 12/8. Hospital course complicated by hyperkalemia.  Due to some respiratory distress, CT chest was done which showed signs of volume overload, some reduction in pulmonary nodules.  Her respiratory status, hydration and renal function has been very tenuous therefore off-and-on requiring IV fluids and diuretics.   12/13: US shows small volume ascites, patient continues to remain short of breath placed on scheduled Lasix iv. 12/18: Patient having ongoing rectal pain after disimpaction.  Having some mild loose stool after laxatives, continues to complain of shortness of breath. 12/19: Now more lethargic, hypoglycemic blood pressure soft and with worsening leukocytosis started on antibiotic for pneumonia coverage. CT abdomen with fecal impaction sterocolitis/urine retention.  12/21: Extensive goals of care discussions.  DNR today.  Recommending comfort care and hospice.   Assessment & Plan:   Metastatic hepatocellular carcinoma with pathological fracture, Intractable metastatic pain Fecal impaction with history of coral proctocolitis Constipation rectal pain Urinary tension  secondary to immobility and constipation Acute on chronic hypoxemic respiratory failure Failure to thrive Severe debility and frailty, no adequate oral intake. No response to treatment despite maximum medical therapy.  Plan: Patient is at end-of-life. Continue supportive care, adequate pain medications, laxatives and other supportive care. Multiple goal of care discussions by oncology, palliative and by this provider. Recommending comfort care and hospice level of care.  Daughters to make decision today. Any aggressive treatment at this point except focused on comfort and providing end-of-life is futile. Will meet with family again.  Addendum: Both daughters agreed on end-of-life care.  Changed to comfort care measures only.  RN to pronounce death if happens in the hospital.  Currently family not desiring to transfer patient to hospice. Would like to see how she does next 24 to 48 hours.   DVT prophylaxis: SCDs   Code Status: DNR Family Communication: Daughter on the phone Disposition Plan: Status is: Inpatient Remains inpatient appropriate because: Critically ill     Consultants:  Oncology Palliative care  Procedures:  Multiple procedures as above  Antimicrobials:  Multiple antibiotics completed   Subjective: Patient seen and examined.  Lethargic.  Does not follow commands.  Audible upper airway sounds.  Objective: Vitals:   11/22/22 1956 11/22/22 1958 11/23/22 0423 11/23/22 0549  BP: (!) 123/48   131/81  Pulse: 69   63  Resp: 17   18  Temp: 98.3 F (36.8 C)   98 F (36.7 C)  TempSrc: Oral   Oral  SpO2: 99% 99%  98%  Weight:   108 kg   Height:        Intake/Output Summary (Last 24 hours) at 11/23/2022 1222 Last data filed at 11/23/2022 0422 Gross per 24 hour  Intake 499.97 ml  Output 950 ml  Net -450.03 ml   Danley Danker  Weights   11/21/22 0628 11/22/22 0629 11/23/22 0423  Weight: 89.5 kg 105.2 kg 108 kg    Examination:  General exam: Very lethargic  and sick looking.  Able to open eyes.  Audible wheezing and upper airway sounds. Respiratory system: Conducted airway sounds.  Poor air entry bilateral.  On minimal oxygen support. Cardiovascular system: S1 & S2 heard, Gastrointestinal system: Soft.  Nontender.   Central nervous system: Lethargic.  Does not follow commands.  She has bilateral lower leg edema.    Data Reviewed: I have personally reviewed following labs and imaging studies  CBC: Recent Labs  Lab 11/19/22 0522 11/20/22 0536 11/21/22 0601 11/22/22 0904 11/23/22 0359  WBC 7.7 8.2 18.2* 12.0* 7.7  NEUTROABS  --   --  15.2* 10.0* 6.5  HGB 10.4* 10.4* 10.8* 10.1* 9.4*  HCT 33.7* 33.8* 35.4* 32.8* 31.9*  MCV 96.6 95.8 95.2 95.9 99.1  PLT 81* 71* 66* 53* 48*   Basic Metabolic Panel: Recent Labs  Lab 11/19/22 0522 11/20/22 0536 11/21/22 0601 11/22/22 0904 11/23/22 0359  NA 139 140 141 142 146*  K 4.8 4.7 5.0 4.1 4.3  CL 98 98 98 103 107  CO2 32 34* 35* 32 32  GLUCOSE 174* 153* 54* 143* 159*  BUN 74* 75* 87* 80* 79*  CREATININE 1.22* 1.26* 1.42* 1.37* 1.41*  CALCIUM 7.7* 7.7* 7.8* 7.3* 7.1*   GFR: Estimated Creatinine Clearance: 34.4 mL/min (A) (by C-G formula based on SCr of 1.41 mg/dL (H)). Liver Function Tests: Recent Labs  Lab 11/19/22 0522 11/20/22 0536 11/21/22 0601 11/22/22 0904 11/23/22 0359  AST 30 31 34 35 27  ALT _0 ALKPHOS 68 70 68 63 51  BILITOT 0.8 0.9 1.0 1.2 1.0  PROT 5.6* 5.7* 5.4* 5.3* 4.8*  ALBUMIN 3.0* 3.2* 2.7* 2.7* 2.4*   No results for input(s): "LIPASE", "AMYLASE" in the last 168 hours. No results for input(s): "AMMONIA" in the last 168 hours. Coagulation Profile: No results for input(s): "INR", "PROTIME" in the last 168 hours. Cardiac Enzymes: No results for input(s): "CKTOTAL", "CKMB", "CKMBINDEX", "TROPONINI" in the last 168 hours. BNP (last 3 results) No results for input(s): "PROBNP" in the last 8760 hours. HbA1C: No results for input(s): "HGBA1C" in  the last 72 hours. CBG: Recent Labs  Lab 11/22/22 1558 11/22/22 2027 11/22/22 2346 11/23/22 0400 11/23/22 0750  GLUCAP 137* 113* 130* 147* 138*   Lipid Profile: No results for input(s): "CHOL", "HDL", "LDLCALC", "TRIG", "CHOLHDL", "LDLDIRECT" in the last 72 hours. Thyroid Function Tests: No results for input(s): "TSH", "T4TOTAL", "FREET4", "T3FREE", "THYROIDAB" in the last 72 hours. Anemia Panel: No results for input(s): "VITAMINB12", "FOLATE", "FERRITIN", "TIBC", "IRON", "RETICCTPCT" in the last 72 hours. Sepsis Labs: Recent Labs  Lab 11/21/22 0601 11/22/22 0904  PROCALCITON 2.64 2.17    Recent Results (from the past 240 hour(s))  Culture, blood (Routine X 2) w Reflex to ID Panel     Status: None (Preliminary result)   Collection Time: 11/21/22  7:33 AM   Specimen: BLOOD RIGHT ARM  Result Value Ref Range Status   Specimen Description   Final    BLOOD RIGHT ARM Performed at [facility] [address], [place], [place] [address]    Special Requests   Final    IN PEDIATRIC BOTTLE Blood Culture adequate volume Performed at [facility] [address], [place], [place] [address]    Culture   Final    NO GROWTH 2  DAYS Performed at [facility], [place] [address] [place], [place] [address]    Report Status PENDING  Incomplete  Culture, blood (Routine X 2) w Reflex to ID Panel     Status: None (Preliminary result)   Collection Time: 11/21/22  7:33 AM   Specimen: BLOOD RIGHT HAND  Result Value Ref Range Status   Specimen Description   Final    BLOOD RIGHT HAND Performed at [facility] [address], [place], [place] [address]    Special Requests   Final    IN PEDIATRIC BOTTLE Blood Culture adequate volume Performed at [facility] [address], [place], [place] [address]    Culture   Final    NO GROWTH 2 DAYS Performed at [facility] [address] [place], [place]  [address]    Report Status PENDING  Incomplete  MRSA Next Gen by PCR, Nasal     Status: None   Collection Time: 11/21/22 11:03 AM   Specimen: Nasal Mucosa; Nasal Swab  Result Value Ref Range Status   MRSA by PCR Next Gen NOT DETECTED NOT DETECTED Final    Comment: (NOTE) The GeneXpert MRSA Assay (FDA approved for NASAL specimens only), is one component of a comprehensive MRSA colonization surveillance program. It is not intended to diagnose MRSA infection nor to guide or monitor treatment for MRSA infections. Test performance is not FDA approved in patients less than 89 years old. Performed at [facility], [place] [address] [place], [place] [address]          Radiology Studies: CT ABDOMEN PELVIS WO CONTRAST  Addendum Date: 11/21/2022   ADDENDUM REPORT: 11/21/2022 17:02 ADDENDUM: For clarification there was perihepatic ascites seen at the inferior margin of the chest CT in the upper abdomen. This appears to have increased since previous imaging. This is new compared to imaging from November. Imaging findings were discussed with the provider as outlined below. These results were called by telephone at the time of interpretation on 11/21/2022 at 5:02 pm to provider [facility] , who verbally acknowledged these results. Electronically Signed   By: Zetta Bills M.D.   On: 11/21/2022 17:02   Result Date: 11/21/2022 CLINICAL DATA:  A female at age 41 presents for evaluation of abdominal pain in the setting of metastatic renal cell carcinoma and cirrhosis. EXAM: CT ABDOMEN AND PELVIS WITHOUT CONTRAST TECHNIQUE: Multidetector CT imaging of the abdomen and pelvis was performed following the standard protocol without IV contrast. RADIATION DOSE REDUCTION: This exam was performed according to the departmental dose-optimization program which includes automated exposure control, adjustment of the mA and/or kV according to patient size and/or use of iterative reconstruction technique.  COMPARISON:  Previous imaging from October 25, 2022 and abdominal sonogram from October 28, 2022. FINDINGS: Lower chest: Incidental imaging of the lung bases with cardiomegaly, heart is incompletely imaged. Decreasing bilateral pleural effusions since previous imaging and decreasing basilar consolidative change still with small RIGHT and trace LEFT effusions and moderate consolidative process at the RIGHT greater than LEFT lung base. Pulmonary metastatic lesions are not as well delineated on current imaging as on previous imaging likely mixed with volume loss at the RIGHT lung base. No chest wall abnormality aside from bilateral flank and chest wall edema in the setting of developing anasarca. Hepatobiliary: Hepatic lesions similar to previous imaging on a background of marked hepatic cirrhosis. Lesion in the posterior RIGHT hemiliver measures as much as 5.3 x 4.4  cm perhaps even slightly larger on some images up to 5.6 cm. Small lesion in the LEFT hepatic lobe (image 15/2) this measures 8 mm. Gallbladder is distended as on previous imaging. Interval development of perihepatic and generalized abdominal ascites since prior imaging. Fluid in the abdomen shows low-density. No gross biliary duct distension. Pancreas: Pancreatic atrophy without acute process. Spleen: Splenomegaly, spleen measuring up to 14 cm greatest craniocaudal dimension. Adrenals/Urinary Tract: Post LEFT nephrectomy. Nodularity in the LEFT nephrectomy bed 2.3 cm unchanged. Stable low-density nodularity of the RIGHT adrenal gland is unchanged. There is marked renal cortical scarring on the RIGHT. There is no RIGHT-sided hydronephrosis. Urinary bladder without focal thickening or stranding on noncontrast imaging. Moderate to marked distension of the urinary bladder may be related to some form of bladder outlet obstruction in the setting of fecal impaction. Stomach/Bowel: Moderate rectal distension with perirectal stranding and mild rectal thickening  7 cm greatest dimension. Sigmoid thickening and adjacent stranding. No signs of bowel obstruction currently. Vascular/Lymphatic: Aortic atherosclerosis. No sign of aneurysm. Smooth contour of the IVC. There is no gastrohepatic or hepatoduodenal ligament lymphadenopathy. No retroperitoneal or mesenteric lymphadenopathy. No pelvic sidewall lymphadenopathy. Reproductive: Post hysterectomy. Other: Small volume ascites developed since previous imaging. Musculoskeletal: Unchanged appearance of pathologic fracture of the L4 vertebral body approximally 20-30% loss of height of the superior endplate. No change in spinal alignment. Lucency in the L5 vertebral body. IMPRESSION: 1. Fecal impaction with stercoral proctocolitis. Given degree of sigmoid thickening would also correlate with any signs of superimposed infectious colitis such as C difficile. 2. Moderate to marked urinary bladder distension could be related to fecal impaction and mass effect upon the bladder base. Correlate with signs of bladder outlet obstruction or urinary retention. 3. Interval development of perihepatic and generalized abdominal ascites since prior imaging. May reflect findings decompensation of hepatic cirrhosis or could be related to ongoing colitis. 4. Signs of cirrhosis and portal hypertension with hepatic masses largest in the posterior RIGHT hemiliver. This could reflect metastatic disease but given the degree of hepatic cirrhosis would also consider the possibility of primary hepatic neoplasm such as hepatocellular carcinoma. AFP correlation may be helpful with tissue sampling or hepatic MRI as possible. 5. Decreasing bilateral pleural effusions and basilar consolidative changes. 6. Unchanged appearance of pathologic fracture at L4. 7. Lucency in the L5 vertebral body is unchanged. 8. Signs of pulmonary metastatic disease not well evaluated. 9. Aortic atherosclerosis. Aortic Atherosclerosis (ICD10-I70.0). Electronically Signed: By: Zetta Bills M.D. On: 11/21/2022 16:05        Scheduled Meds:  Chlorhexidine Gluconate Cloth  6 each Topical Daily   fentaNYL  1 patch Transdermal Q72H   levothyroxine  25 mcg Oral Q MTWThF   scopolamine  1 patch Transdermal Q72H   sodium chloride flush  10-40 mL Intracatheter Q12H   Continuous Infusions:  sodium chloride     dextrose 5 % and 0.9% NaCl 25 mL/hr at 11/23/22 0711     LOS: 29 days    Time spent: 35 minutes    Barb Merino, MD [facility] Pager [phone]

## 2022-11-24 DIAGNOSIS — C7800 Secondary malignant neoplasm of unspecified lung: Secondary | ICD-10-CM

## 2022-11-24 DIAGNOSIS — C220 Liver cell carcinoma: Secondary | ICD-10-CM | POA: Diagnosis not present

## 2022-11-24 DIAGNOSIS — R52 Pain, unspecified: Secondary | ICD-10-CM | POA: Diagnosis not present

## 2022-11-24 DIAGNOSIS — E870 Hyperosmolality and hypernatremia: Secondary | ICD-10-CM | POA: Insufficient documentation

## 2022-11-24 LAB — GLUCOSE, CAPILLARY: Glucose-Capillary: 163 mg/dL — ABNORMAL HIGH (ref 70–99)

## 2022-11-24 MED ORDER — SODIUM CHLORIDE 0.9 % IV SOLN: INTRAVENOUS | Status: DC

## 2022-11-24 NOTE — Progress Notes (Signed)
PROGRESS NOTE    Margaret Dyer  KVQ:[id number] DOB: 1935/10/11 DOA: 10/31/2022 PCP: Ann Held, DO     Brief Narrative:  Margaret Dyer is an 87 year old with history of DM2, HTN, HLD, diastolic CHF, pulmonary HTN, cirrhosis, CKD stage IIIa, renal cell carcinoma with left nephrectomy in 2000, hypothyroidism presented to hospital with intractable back pain currently admitted with working diagnosis of metastatic hepatic cellular carcinoma, respiratory failure.    Her CT scan showed compression fracture of L4 with innumerable bilateral pulmonary metastases, posterior left acetabular wall fracture.  MRI confirmed these findings and patient was admitted to the hospital.  Radiology and oncology were consulted. Patient was transferred to [facility] for radiation treatments.  Since being here patient is getting pain control, Chemo-Port placed on 12/8. Hospital course complicated by hyperkalemia.  Due to some respiratory distress, CT chest was done which showed signs of volume overload, some reduction in pulmonary nodules.  Her respiratory status, hydration and renal function has been very tenuous therefore off-and-on requiring IV fluids and diuretics.    12/13: US shows small volume ascites, patient continues to remain short of breath placed on scheduled Lasix iv. 12/18: Patient having ongoing rectal pain after disimpaction.  Having some mild loose stool after laxatives, continues to complain of shortness of breath. 12/19: Now more lethargic, hypoglycemic blood pressure soft and with worsening leukocytosis started on antibiotic for pneumonia coverage. CT abdomen with fecal impaction sterocolitis/urine retention.  12/21: Extensive goals of care discussions.  DNR now.  Recommending comfort care and hospice.  New events last 24 hours / Subjective: No new events overnight.  Currently appears to be comfortable, continue comfort care measures  Assessment & Plan:  Principal Problem:    Hepatocellular carcinoma metastatic to lung [facility]) Active Problems:   Hyperlipidemia   Gout, unspecified   Essential hypertension   Asthma   Anxiety and depression   Platelets decreased (HCC)   Diabetes mellitus type 2, insulin dependent ([place])   History of nephrectomy   Liver cirrhosis secondary to NASH (HCC)   Thrombocytopenia (HCC)   Hypothyroidism   Chronic hypoxemic respiratory failure (HCC)   Chronic diastolic CHF (congestive heart failure) (HCC)   Pulmonary hypertension (HCC)   Chronic renal disease, stage 4, severely decreased glomerular filtration rate (GFR) between 15-29 mL/min/1.73 square meter (HCC)   OSA (obstructive sleep apnea)   Restrictive lung disease   Intractable pain   Pathological fracture   Hypernatremia   Continue comfort care measures.  Anticipate in-hospital death.    Antimicrobials:  Anti-infectives (From admission, onward)    Start     Dose/Rate Route Frequency Ordered Stop   11/21/22 1700  metroNIDAZOLE (FLAGYL) IVPB 500 mg  Status:  Discontinued        500 mg 100 mL/hr over 60 Minutes Intravenous Every 12 hours 11/21/22 0816 11/23/22 0655   11/21/22 1600  ceFEPIme (MAXIPIME) 2 g in sodium chloride 0.9 % 100 mL IVPB  Status:  Discontinued        2 g 200 mL/hr over 30 Minutes Intravenous Every 12 hours 11/21/22 0816 11/23/22 0721   11/21/22 1000  vancomycin (VANCOCIN) IVPB 1000 mg/200 mL premix  Status:  Discontinued        1,000 mg 200 mL/hr over 60 Minutes Intravenous Every 48 hours 11/21/22 0807 11/22/22 0949   11/21/22 0900  piperacillin-tazobactam (ZOSYN) IVPB 3.375 g  Status:  Discontinued        3.375 g 12.5 mL/hr over 240 Minutes Intravenous Every  8 hours 11/21/22 0748 11/21/22 0816   11/21/22 0815  ceFEPIme (MAXIPIME) 2 g in sodium chloride 0.9 % 100 mL IVPB  Status:  Discontinued        2 g 200 mL/hr over 30 Minutes Intravenous Every 12 hours 11/21/22 0727 11/21/22 0748   11/16/22 1000  fluconazole (DIFLUCAN) tablet 100 mg  Status:   Discontinued        100 mg Oral Daily 11/16/22 0658 11/23/22 0655   11/13/22 1000  azithromycin (ZITHROMAX) tablet 500 mg  Status:  Discontinued        500 mg Oral Daily 11/13/22 0732 11/16/22 0722   11/11/22 0800  azithromycin (ZITHROMAX) 500 mg in sodium chloride 0.9 % 250 mL IVPB  Status:  Discontinued        500 mg 250 mL/hr over 60 Minutes Intravenous Every 24 hours 11/11/22 0717 11/13/22 0732   12/02/2022 0800  meropenem (MERREM) 500 mg in sodium chloride 0.9 % 100 mL IVPB        500 mg 200 mL/hr over 30 Minutes Intravenous 2 times daily 11/26/2022 0637 11/07/22 2316   11/02/22 0800  vancomycin (VANCOCIN) IVPB 1000 mg/200 mL premix        1,000 mg 200 mL/hr over 60 Minutes Intravenous To Radiology 11/02/22 0710 11/15/2022 0800   10/25/22 0715  cefTRIAXone (ROCEPHIN) 1 g in sodium chloride 0.9 % 100 mL IVPB  Status:  Discontinued        1 g 200 mL/hr over 30 Minutes Intravenous Every 24 hours 10/25/22 0701 10/27/22 1515        Objective: Vitals:   11/23/22 0549 11/23/22 1528 11/23/22 2048 11/24/22 0356  BP: 131/81 (!) 118/44 (!) 119/45 (!) 117/56  Pulse: 63 72 63 65  Resp: _0 Temp: 98 F (36.7 C) 98.5 F (36.9 C) (!) 97.4 F (36.3 C) 98.5 F (36.9 C)  TempSrc: Oral  Oral   SpO2: 98% 96% 93% 98%  Weight:      Height:        Intake/Output Summary (Last 24 hours) at 11/24/2022 1219 Last data filed at 11/24/2022 0400 Gross per 24 hour  Intake --  Output 850 ml  Net -850 ml   Filed Weights   11/21/22 0628 11/22/22 0629 11/23/22 0423  Weight: 89.5 kg 105.2 kg 108 kg    Examination:  General exam: Appears calm and comfortable   Data Reviewed: I have personally reviewed following labs and imaging studies  CBC: Recent Labs  Lab 11/19/22 0522 11/20/22 0536 11/21/22 0601 11/22/22 0904 11/23/22 0359  WBC 7.7 8.2 18.2* 12.0* 7.7  NEUTROABS  --   --  15.2* 10.0* 6.5  HGB 10.4* 10.4* 10.8* 10.1* 9.4*  HCT 33.7* 33.8* 35.4* 32.8* 31.9*  MCV 96.6 95.8 95.2  95.9 99.1  PLT 81* 71* 66* 53* 48*   Basic Metabolic Panel: Recent Labs  Lab 11/19/22 0522 11/20/22 0536 11/21/22 0601 11/22/22 0904 11/23/22 0359  NA 139 140 141 142 146*  K 4.8 4.7 5.0 4.1 4.3  CL 98 98 98 103 107  CO2 32 34* 35* 32 32  GLUCOSE 174* 153* 54* 143* 159*  BUN 74* 75* 87* 80* 79*  CREATININE 1.22* 1.26* 1.42* 1.37* 1.41*  CALCIUM 7.7* 7.7* 7.8* 7.3* 7.1*   GFR: Estimated Creatinine Clearance: 34.4 mL/min (A) (by C-G formula based on SCr of 1.41 mg/dL (H)). Liver Function Tests: Recent Labs  Lab 11/19/22 0522 11/20/22 1191 11/21/22 0601 11/22/22 4782 11/23/22 0359  AST 30 31 34 35 27  ALT _0 ALKPHOS 68 70 68 63 51  BILITOT 0.8 0.9 1.0 1.2 1.0  PROT 5.6* 5.7* 5.4* 5.3* 4.8*  ALBUMIN 3.0* 3.2* 2.7* 2.7* 2.4*   No results for input(s): "LIPASE", "AMYLASE" in the last 168 hours. No results for input(s): "AMMONIA" in the last 168 hours. Coagulation Profile: No results for input(s): "INR", "PROTIME" in the last 168 hours. Cardiac Enzymes: No results for input(s): "CKTOTAL", "CKMB", "CKMBINDEX", "TROPONINI" in the last 168 hours. BNP (last 3 results) No results for input(s): "PROBNP" in the last 8760 hours. HbA1C: No results for input(s): "HGBA1C" in the last 72 hours. CBG: Recent Labs  Lab 11/22/22 1558 11/22/22 2027 11/22/22 2346 11/23/22 0400 11/23/22 0750  GLUCAP 137* 113* 130* 147* 138*   Lipid Profile: No results for input(s): "CHOL", "HDL", "LDLCALC", "TRIG", "CHOLHDL", "LDLDIRECT" in the last 72 hours. Thyroid Function Tests: No results for input(s): "TSH", "T4TOTAL", "FREET4", "T3FREE", "THYROIDAB" in the last 72 hours. Anemia Panel: No results for input(s): "VITAMINB12", "FOLATE", "FERRITIN", "TIBC", "IRON", "RETICCTPCT" in the last 72 hours. Sepsis Labs: Recent Labs  Lab 11/21/22 0601 11/22/22 0904  PROCALCITON 2.64 2.17    Recent Results (from the past 240 hour(s))  Culture, blood (Routine X 2) w Reflex to ID  Panel     Status: None (Preliminary result)   Collection Time: 11/21/22  7:33 AM   Specimen: BLOOD RIGHT ARM  Result Value Ref Range Status   Specimen Description   Final    BLOOD RIGHT ARM Performed at [facility] [address], [place], [place] [address]    Special Requests   Final    IN PEDIATRIC BOTTLE Blood Culture adequate volume Performed at [facility] [address], [place], [place] [address]    Culture   Final    NO GROWTH 3 DAYS Performed at [facility], [place] [address], [place], [place] [address]    Report Status PENDING  Incomplete  Culture, blood (Routine X 2) w Reflex to ID Panel     Status: None (Preliminary result)   Collection Time: 11/21/22  7:33 AM   Specimen: BLOOD RIGHT HAND  Result Value Ref Range Status   Specimen Description   Final    BLOOD RIGHT HAND Performed at [facility] [address], [place], [place] [address]    Special Requests   Final    IN PEDIATRIC BOTTLE Blood Culture adequate volume Performed at [facility] [address] [place], [place] [address]    Culture   Final    NO GROWTH 3 DAYS Performed at [facility], [place] [address], [place], [place] [address]    Report Status PENDING  Incomplete  MRSA Next Gen by PCR, Nasal     Status: None   Collection Time: 11/21/22 11:03 AM   Specimen: Nasal Mucosa; Nasal Swab  Result Value Ref Range Status   MRSA by PCR Next Gen NOT DETECTED NOT DETECTED Final    Comment: (NOTE) The GeneXpert MRSA Assay (FDA approved for NASAL specimens only), is one component of a comprehensive MRSA colonization surveillance program. It is not intended to diagnose MRSA infection nor to guide or monitor treatment for MRSA infections. Test performance is not FDA approved in patients less than 69 years old. Performed at [facility], [place] [address] [place], [place] [address]       Radiology  Studies: No results found.    Scheduled Meds:  Chlorhexidine Gluconate Cloth  6 each Topical Daily   scopolamine  1 patch Transdermal Q72H   sodium chloride flush  10-40 mL Intracatheter Q12H   Continuous Infusions:  sodium chloride       LOS: 30 days   Time spent: 20 minutes   Dessa Phi, DO [facility] 11/24/2022, 12:19 PM   Available via Epic secure chat 7am-7pm After these hours, please refer to coverage provider listed on [email or web address]

## 2022-11-24 NOTE — Plan of Care (Signed)
  Problem: Coping: Goal: Level of anxiety will decrease Outcome: Progressing   Problem: Coping: Goal: Level of anxiety will decrease Outcome: Progressing   Problem: Pain Managment: Goal: General experience of comfort will improve Outcome: Progressing

## 2022-11-24 NOTE — Progress Notes (Signed)
[name] is comfortable.  She now is comfort care.  She is very relaxed.  She was not that responsive.  She is not labored with her breathing.  She does not sound as congested.  I am just happy that she has peace.  Her heart rate is very slow.  I would have to believe that she may not make it through today.  I know that her faith is strong.  She knows where she is going.  We have prayed a lot about this.  I do appreciate the great care that the staff [facility] [address] has given her.  [name], MD  2 Timothy 4:16-18

## 2022-11-25 DIAGNOSIS — C220 Liver cell carcinoma: Secondary | ICD-10-CM | POA: Diagnosis not present

## 2022-11-25 DIAGNOSIS — C7801 Secondary malignant neoplasm of right lung: Secondary | ICD-10-CM | POA: Diagnosis not present

## 2022-11-25 DIAGNOSIS — C7800 Secondary malignant neoplasm of unspecified lung: Secondary | ICD-10-CM | POA: Diagnosis not present

## 2022-11-25 LAB — GLUCOSE, CAPILLARY: Glucose-Capillary: 173 mg/dL — ABNORMAL HIGH (ref 70–99)

## 2022-11-25 MED ORDER — HYDROMORPHONE HCL 1 MG/ML IJ SOLN
2.0000 mg | INTRAMUSCULAR | Status: DC | PRN
  Administered 2022-11-25 (×2): 2 mg via INTRAVENOUS
  Filled 2022-11-25 (×2): qty 2

## 2022-11-25 MED ORDER — MORPHINE 100MG IN NS 100ML (1MG/ML) PREMIX INFUSION
1.0000 mg/h | INTRAVENOUS | Status: DC
  Administered 2022-11-25: 1 mg/h via INTRAVENOUS
  Filled 2022-11-25: qty 100

## 2022-11-25 MED ORDER — MORPHINE SULFATE (PF) 2 MG/ML IV SOLN: 1.0000 mg | INTRAVENOUS | Status: DC | PRN

## 2022-11-25 NOTE — Plan of Care (Signed)
  Problem: Education: Goal: Ability to describe self-care measures that may prevent or decrease complications (Diabetes Survival Skills Education) will improve Outcome: Progressing Goal: Individualized Educational Video(s) Outcome: Progressing   Problem: Coping: Goal: Ability to adjust to condition or change in health will improve Outcome: Progressing   Problem: Fluid Volume: Goal: Ability to maintain a balanced intake and output will improve Outcome: Progressing   Problem: Health Behavior/Discharge Planning: Goal: Ability to identify and utilize available resources and services will improve Outcome: Progressing Goal: Ability to manage health-related needs will improve Outcome: Progressing   Problem: Metabolic: Goal: Ability to maintain appropriate glucose levels will improve Outcome: Progressing   Problem: Nutritional: Goal: Maintenance of adequate nutrition will improve Outcome: Progressing Goal: Progress toward achieving an optimal weight will improve Outcome: Progressing   Problem: Skin Integrity: Goal: Risk for impaired skin integrity will decrease Outcome: Progressing   Problem: Tissue Perfusion: Goal: Adequacy of tissue perfusion will improve Outcome: Progressing   Problem: Education: Goal: Knowledge of General Education information will improve Description: Including pain rating scale, medication(s)/side effects and non-pharmacologic comfort measures Outcome: Progressing   Problem: Health Behavior/Discharge Planning: Goal: Ability to manage health-related needs will improve Outcome: Progressing   Problem: Clinical Measurements: Goal: Ability to maintain clinical measurements within normal limits will improve Outcome: Progressing Goal: Will remain free from infection Outcome: Progressing Goal: Diagnostic test results will improve Outcome: Progressing Goal: Respiratory complications will improve Outcome: Progressing Goal: Cardiovascular complication will  be avoided Outcome: Progressing   Problem: Activity: Goal: Risk for activity intolerance will decrease Outcome: Progressing   Problem: Nutrition: Goal: Adequate nutrition will be maintained Outcome: Progressing   Problem: Coping: Goal: Level of anxiety will decrease Outcome: Progressing   Problem: Elimination: Goal: Will not experience complications related to bowel motility Outcome: Progressing Goal: Will not experience complications related to urinary retention Outcome: Progressing   Problem: Pain Managment: Goal: General experience of comfort will improve Outcome: Progressing   Problem: Safety: Goal: Ability to remain free from injury will improve Outcome: Progressing   Problem: Skin Integrity: Goal: Risk for impaired skin integrity will decrease Outcome: Progressing   Problem: Education: Goal: Knowledge of General Education information will improve Description: Including pain rating scale, medication(s)/side effects and non-pharmacologic comfort measures Outcome: Progressing

## 2022-11-25 NOTE — Plan of Care (Signed)
  Problem: Clinical Measurements: Goal: Ability to maintain clinical measurements within normal limits will improve 11/25/2022 0617 by Mable Paris, RN Outcome: Not Progressing 11/25/2022 0616 by Mable Paris, RN Outcome: Progressing Goal: Will remain free from infection 11/25/2022 0617 by Mable Paris, RN Outcome: Not Progressing 11/25/2022 0616 by Mable Paris, RN Outcome: Progressing Goal: Diagnostic test results will improve 11/25/2022 0617 by Mable Paris, RN Outcome: Not Progressing 11/25/2022 0616 by Mable Paris, RN Outcome: Progressing Goal: Respiratory complications will improve 11/25/2022 0617 by Mable Paris, RN Outcome: Not Progressing 11/25/2022 0616 by Mable Paris, RN Outcome: Progressing Goal: Cardiovascular complication will be avoided 11/25/2022 0617 by Mable Paris, RN Outcome: Not Progressing 11/25/2022 0616 by Mable Paris, RN Outcome: Progressing   Problem: Activity: Goal: Risk for activity intolerance will decrease 11/25/2022 0617 by Mable Paris, RN Outcome: Not Progressing 11/25/2022 0616 by Mable Paris, RN Outcome: Progressing   Problem: Nutrition: Goal: Adequate nutrition will be maintained 11/25/2022 0617 by Mable Paris, RN Outcome: Not Progressing 11/25/2022 0616 by Mable Paris, RN Outcome: Progressing   Problem: Coping: Goal: Level of anxiety will decrease 11/25/2022 0617 by Mable Paris, RN Outcome: Not Progressing 11/25/2022 0616 by Mable Paris, RN Outcome: Progressing   Problem: Elimination: Goal: Will not experience complications related to bowel motility 11/25/2022 0617 by Mable Paris, RN Outcome: Not Progressing 11/25/2022 0616 by Mable Paris, RN Outcome: Progressing Goal: Will not experience complications related to urinary retention 11/25/2022 0617 by Mable Paris, RN Outcome: Not Progressing 11/25/2022 0616 by Mable Paris, RN Outcome: Progressing   Problem: Pain Managment: Goal: General  experience of comfort will improve 11/25/2022 0617 by Mable Paris, RN Outcome: Not Progressing 11/25/2022 0616 by Mable Paris, RN Outcome: Progressing   Problem: Skin Integrity: Goal: Risk for impaired skin integrity will decrease 11/25/2022 0617 by Mable Paris, RN Outcome: Not Progressing 11/25/2022 0616 by Mable Paris, RN Outcome: Progressing   Problem: Safety: Goal: Ability to remain free from injury will improve 11/25/2022 0617 by Mable Paris, RN Outcome: Not Progressing 11/25/2022 0616 by Mable Paris, RN Outcome: Progressing   Problem: Skin Integrity: Goal: Risk for impaired skin integrity will decrease 11/25/2022 0617 by Mable Paris, RN Outcome: Not Progressing 11/25/2022 0616 by Mable Paris, RN Outcome: Progressing   Problem: Education: Goal: Knowledge of the prescribed therapeutic regimen will improve 11/25/2022 0617 by Mable Paris, RN Outcome: Not Progressing 11/25/2022 0616 by Mable Paris, RN Outcome: Progressing   Problem: Coping: Goal: Ability to identify and develop effective coping behavior will improve 11/25/2022 0617 by Mable Paris, RN Outcome: Not Progressing 11/25/2022 0616 by Mable Paris, RN Outcome: Progressing   Problem: Clinical Measurements: Goal: Quality of life will improve 11/25/2022 0617 by Mable Paris, RN Outcome: Not Progressing 11/25/2022 0616 by Mable Paris, RN Outcome: Progressing   Comfort care maintained, will continue to observe.

## 2022-11-25 NOTE — Progress Notes (Addendum)
PROGRESS NOTE    Margaret Dyer  AST:[id number] DOB: 1935-03-19 DOA: 10/30/2022 PCP: Ann Held, DO     Brief Narrative:  Margaret Dyer is an 87 year old with history of DM2, HTN, HLD, diastolic CHF, pulmonary HTN, cirrhosis, CKD stage IIIa, renal cell carcinoma with left nephrectomy in 2000, hypothyroidism presented to hospital with intractable back pain currently admitted with working diagnosis of metastatic hepatic cellular carcinoma, respiratory failure.    Her CT scan showed compression fracture of L4 with innumerable bilateral pulmonary metastases, posterior left acetabular wall fracture.  MRI confirmed these findings and patient was admitted to the hospital.  Radiology and oncology were consulted. Patient was transferred to [facility] for radiation treatments.  Since being here patient is getting pain control, Chemo-Port placed on 12/8. Hospital course complicated by hyperkalemia.  Due to some respiratory distress, CT chest was done which showed signs of volume overload, some reduction in pulmonary nodules.  Her respiratory status, hydration and renal function has been very tenuous therefore off-and-on requiring IV fluids and diuretics.    12/13: US shows small volume ascites, patient continues to remain short of breath placed on scheduled Lasix iv. 12/18: Patient having ongoing rectal pain after disimpaction.  Having some mild loose stool after laxatives, continues to complain of shortness of breath. 12/19: Now more lethargic, hypoglycemic blood pressure soft and with worsening leukocytosis started on antibiotic for pneumonia coverage. CT abdomen with fecal impaction sterocolitis/urine retention.  12/21: Extensive goals of care discussions.  DNR now.  Recommending comfort care and hospice.  New events last 24 hours / Subjective: Yesterday family had requested continuous O2 monitoring and IVF. Patient is currently under comfort care measures. Reviewed Dr. Antonieta Dyer note from this  morning   Assessment & Plan:  Principal Problem:   Hepatocellular carcinoma metastatic to lung [facility]) Active Problems:   Hyperlipidemia   Gout, unspecified   Essential hypertension   Asthma   Anxiety and depression   Platelets decreased ([place])   Diabetes mellitus type 2, insulin dependent ([place])   History of nephrectomy   Liver cirrhosis secondary to NASH (HCC)   Thrombocytopenia (HCC)   Hypothyroidism   Chronic hypoxemic respiratory failure (HCC)   Chronic diastolic CHF (congestive heart failure) (HCC)   Pulmonary hypertension (HCC)   Chronic renal disease, stage 4, severely decreased glomerular filtration rate (GFR) between 15-29 mL/min/1.73 square meter (HCC)   OSA (obstructive sleep apnea)   Restrictive lung disease   Intractable pain   Pathological fracture   Hypernatremia   Continue comfort care measures.  Anticipate in-hospital death.  Updated daughters by phone. Margaret Dyer is wishing to continue IV fluids. Margaret Dyer and Margaret Dyer have voiced their concerns regarding patient's pain control and we agreed to start morphine drip for comfort.     Antimicrobials:  Anti-infectives (From admission, onward)    Start     Dose/Rate Route Frequency Ordered Stop   11/21/22 1700  metroNIDAZOLE (FLAGYL) IVPB 500 mg  Status:  Discontinued        500 mg 100 mL/hr over 60 Minutes Intravenous Every 12 hours 11/21/22 0816 11/23/22 0655   11/21/22 1600  ceFEPIme (MAXIPIME) 2 g in sodium chloride 0.9 % 100 mL IVPB  Status:  Discontinued        2 g 200 mL/hr over 30 Minutes Intravenous Every 12 hours 11/21/22 0816 11/23/22 0721   11/21/22 1000  vancomycin (VANCOCIN) IVPB 1000 mg/200 mL premix  Status:  Discontinued        1,000 mg  200 mL/hr over 60 Minutes Intravenous Every 48 hours 11/21/22 0807 11/22/22 0949   11/21/22 0900  piperacillin-tazobactam (ZOSYN) IVPB 3.375 g  Status:  Discontinued        3.375 g 12.5 mL/hr over 240 Minutes Intravenous Every 8 hours 11/21/22 0748 11/21/22 0816    11/21/22 0815  ceFEPIme (MAXIPIME) 2 g in sodium chloride 0.9 % 100 mL IVPB  Status:  Discontinued        2 g 200 mL/hr over 30 Minutes Intravenous Every 12 hours 11/21/22 0727 11/21/22 0748   11/16/22 1000  fluconazole (DIFLUCAN) tablet 100 mg  Status:  Discontinued        100 mg Oral Daily 11/16/22 0658 11/23/22 0655   11/13/22 1000  azithromycin (ZITHROMAX) tablet 500 mg  Status:  Discontinued        500 mg Oral Daily 11/13/22 0732 11/16/22 0722   11/11/22 0800  azithromycin (ZITHROMAX) 500 mg in sodium chloride 0.9 % 250 mL IVPB  Status:  Discontinued        500 mg 250 mL/hr over 60 Minutes Intravenous Every 24 hours 11/11/22 0717 11/13/22 0732   11/17/2022 0800  meropenem (MERREM) 500 mg in sodium chloride 0.9 % 100 mL IVPB        500 mg 200 mL/hr over 30 Minutes Intravenous 2 times daily 11/21/2022 0637 11/07/22 2316   11/02/22 0800  vancomycin (VANCOCIN) IVPB 1000 mg/200 mL premix        1,000 mg 200 mL/hr over 60 Minutes Intravenous To Radiology 11/02/22 0710 11/12/2022 0800   10/25/22 0715  cefTRIAXone (ROCEPHIN) 1 g in sodium chloride 0.9 % 100 mL IVPB  Status:  Discontinued        1 g 200 mL/hr over 30 Minutes Intravenous Every 24 hours 10/25/22 0701 10/27/22 1515        Objective: Vitals:   11/23/22 2048 11/24/22 0356 11/24/22 1228 11/24/22 2120  BP: (!) 119/45 (!) 117/56 (!) 137/45 (!) 152/54  Pulse: 63 65 68 (!) 58  Resp: _0 Temp: (!) 97.4 F (36.3 C) 98.5 F (36.9 C) 98.1 F (36.7 C) 98 F (36.7 C)  TempSrc: Oral  Oral Oral  SpO2: 93% 98% 92% 96%  Weight:      Height:        Intake/Output Summary (Last 24 hours) at 11/25/2022 1006 Last data filed at 11/25/2022 0856 Gross per 24 hour  Intake 289.73 ml  Output 1400 ml  Net -1110.27 ml    Filed Weights   11/21/22 0628 11/22/22 0629 11/23/22 0423  Weight: 89.5 kg 105.2 kg 108 kg    Examination:  General exam: Appears calm and comfortable   Data Reviewed: I have personally reviewed following labs  and imaging studies  CBC: Recent Labs  Lab 11/19/22 0522 11/20/22 0536 11/21/22 0601 11/22/22 0904 11/23/22 0359  WBC 7.7 8.2 18.2* 12.0* 7.7  NEUTROABS  --   --  15.2* 10.0* 6.5  HGB 10.4* 10.4* 10.8* 10.1* 9.4*  HCT 33.7* 33.8* 35.4* 32.8* 31.9*  MCV 96.6 95.8 95.2 95.9 99.1  PLT 81* 71* 66* 53* 48*    Basic Metabolic Panel: Recent Labs  Lab 11/19/22 0522 11/20/22 0536 11/21/22 0601 11/22/22 0904 11/23/22 0359  NA 139 140 141 142 146*  K 4.8 4.7 5.0 4.1 4.3  CL 98 98 98 103 107  CO2 32 34* 35* 32 32  GLUCOSE 174* 153* 54* 143* 159*  BUN 74* 75* 87* 80* 79*  CREATININE 1.22*  1.26* 1.42* 1.37* 1.41*  CALCIUM 7.7* 7.7* 7.8* 7.3* 7.1*    GFR: Estimated Creatinine Clearance: 34.4 mL/min (A) (by C-G formula based on SCr of 1.41 mg/dL (H)). Liver Function Tests: Recent Labs  Lab 11/19/22 0522 11/20/22 0536 11/21/22 0601 11/22/22 0904 11/23/22 0359  AST 30 31 34 35 27  ALT _0 ALKPHOS 68 70 68 63 51  BILITOT 0.8 0.9 1.0 1.2 1.0  PROT 5.6* 5.7* 5.4* 5.3* 4.8*  ALBUMIN 3.0* 3.2* 2.7* 2.7* 2.4*    No results for input(s): "LIPASE", "AMYLASE" in the last 168 hours. No results for input(s): "AMMONIA" in the last 168 hours. Coagulation Profile: No results for input(s): "INR", "PROTIME" in the last 168 hours. Cardiac Enzymes: No results for input(s): "CKTOTAL", "CKMB", "CKMBINDEX", "TROPONINI" in the last 168 hours. BNP (last 3 results) No results for input(s): "PROBNP" in the last 8760 hours. HbA1C: No results for input(s): "HGBA1C" in the last 72 hours. CBG: Recent Labs  Lab 11/22/22 2027 11/22/22 2346 11/23/22 0400 11/23/22 0750 11/24/22 1444  GLUCAP 113* 130* 147* 138* 163*    Lipid Profile: No results for input(s): "CHOL", "HDL", "LDLCALC", "TRIG", "CHOLHDL", "LDLDIRECT" in the last 72 hours. Thyroid Function Tests: No results for input(s): "TSH", "T4TOTAL", "FREET4", "T3FREE", "THYROIDAB" in the last 72 hours. Anemia Panel: No  results for input(s): "VITAMINB12", "FOLATE", "FERRITIN", "TIBC", "IRON", "RETICCTPCT" in the last 72 hours. Sepsis Labs: Recent Labs  Lab 11/21/22 0601 11/22/22 0904  PROCALCITON 2.64 2.17     Recent Results (from the past 240 hour(s))  Culture, blood (Routine X 2) w Reflex to ID Panel     Status: None (Preliminary result)   Collection Time: 11/21/22  7:33 AM   Specimen: BLOOD RIGHT ARM  Result Value Ref Range Status   Specimen Description   Final    BLOOD RIGHT ARM Performed at [facility] [address] [place], [place] [address]    Special Requests   Final    IN PEDIATRIC BOTTLE Blood Culture adequate volume Performed at [facility] [address], [place], [place] [address]    Culture   Final    NO GROWTH 4 DAYS Performed at [facility], [place] [address], [place], [place] [address]    Report Status PENDING  Incomplete  Culture, blood (Routine X 2) w Reflex to ID Panel     Status: None (Preliminary result)   Collection Time: 11/21/22  7:33 AM   Specimen: BLOOD RIGHT HAND  Result Value Ref Range Status   Specimen Description   Final    BLOOD RIGHT HAND Performed at [facility] [address], [place], [place] [address]    Special Requests   Final    IN PEDIATRIC BOTTLE Blood Culture adequate volume Performed at [facility] [address], [place], [place] [address]    Culture   Final    NO GROWTH 4 DAYS Performed at [facility], [place] [address], [place], [place] [address]    Report Status PENDING  Incomplete  MRSA Next Gen by PCR, Nasal     Status: None   Collection Time: 11/21/22 11:03 AM   Specimen: Nasal Mucosa; Nasal Swab  Result Value Ref Range Status   MRSA by PCR Next Gen NOT DETECTED NOT DETECTED Final    Comment: (NOTE) The GeneXpert MRSA Assay (FDA approved for NASAL specimens only), is one component of a comprehensive MRSA colonization  surveillance program. It is not intended to diagnose MRSA infection nor to guide or monitor treatment for MRSA infections. Test performance is not FDA approved in patients less than 22 years old. Performed at [facility], [place] [address] [place], [place] [address]       Radiology Studies: No results found.    Scheduled Meds:  Chlorhexidine Gluconate Cloth  6 each Topical Daily   scopolamine  1 patch Transdermal Q72H   sodium chloride flush  10-40 mL Intracatheter Q12H   Continuous Infusions:  sodium chloride 50 mL/hr at 11/25/22 0924     LOS: 31 days   Time spent: 20 minutes   Dessa Phi, DO [facility] 11/25/2022, 10:06 AM   Available via Epic secure chat 7am-7pm After these hours, please refer to coverage provider listed on [email or web address]

## 2022-11-25 NOTE — Progress Notes (Signed)
Ms. Lumbra is still hanging in.  She is getting some hydration.  Her family wishes for have hydration.  I just worry about the fact that any IV fluid that she gets we will just go and third space into her.  I spoke to her daughter about this this morning.  She understands this.  However, she still does not wish to have her mother suffer and not have some fluid.  Otherwise, everything seems to be relatively stable.  She is very weak.  She has a very weak cough.  Her lungs sound a little bit more congested.  She has little more wheezing.  1 possibility to try would be a morphine nebulizer.  This is a very good bronchodilator that is not systemically absorbed.  This could was be considered.  She does not appear to be hurting.  She is in no respiratory distress which is nice to see.  She has had no fever.  It really would not surprise me if she began to have some low-grade temperatures just from her underlying malignancy.  How long this will last is unclear.  I did tell her daughter that the hydration clearly is going to prolong her existence.  Her daughter feels that she probably will pass on Christmas Day.  I does want to make sure that she is comfortable.  She still looks like she is comfortable right now.  Again, the lungs will have to be watched closely.  Her O2 saturation will have to be watched closely.  I do appreciate the wonderful care that she is getting from everybody up on 4 E.   [name], MD  Lurena Joiner 1:30

## 2022-11-26 DIAGNOSIS — C220 Liver cell carcinoma: Secondary | ICD-10-CM | POA: Diagnosis not present

## 2022-11-26 DIAGNOSIS — C7800 Secondary malignant neoplasm of unspecified lung: Secondary | ICD-10-CM | POA: Diagnosis not present

## 2022-11-26 LAB — CULTURE, BLOOD (ROUTINE X 2)
Culture: NO GROWTH
Culture: NO GROWTH
Special Requests: ADEQUATE
Special Requests: ADEQUATE

## 2022-11-26 LAB — GLUCOSE, CAPILLARY: Glucose-Capillary: 134 mg/dL — ABNORMAL HIGH (ref 70–99)

## 2022-11-26 NOTE — Progress Notes (Signed)
PROGRESS NOTE    Margaret Dyer  VFI:[id number] DOB: 1935-10-21 DOA: 10/22/2022 PCP: Ann Held, DO     Brief Narrative:  Margaret Dyer is an 87 year old with history of DM2, HTN, HLD, diastolic CHF, pulmonary HTN, cirrhosis, CKD stage IIIa, renal cell carcinoma with left nephrectomy in 2000, hypothyroidism presented to hospital with intractable back pain currently admitted with working diagnosis of metastatic hepatic cellular carcinoma, respiratory failure.    Her CT scan showed compression fracture of L4 with innumerable bilateral pulmonary metastases, posterior left acetabular wall fracture.  MRI confirmed these findings and patient was admitted to the hospital.  Radiology and oncology were consulted. Patient was transferred to [facility] for radiation treatments.  Since being here patient is getting pain control, Chemo-Port placed on 12/8. Hospital course complicated by hyperkalemia.  Due to some respiratory distress, CT chest was done which showed signs of volume overload, some reduction in pulmonary nodules.  Her respiratory status, hydration and renal function has been very tenuous therefore off-and-on requiring IV fluids and diuretics.    12/13: US shows small volume ascites, patient continues to remain short of breath placed on scheduled Lasix iv. 12/18: Patient having ongoing rectal pain after disimpaction.  Having some mild loose stool after laxatives, continues to complain of shortness of breath. 12/19: Now more lethargic, hypoglycemic blood pressure soft and with worsening leukocytosis started on antibiotic for pneumonia coverage. CT abdomen with fecal impaction sterocolitis/urine retention.  12/21: Extensive goals of care discussions.  DNR now.  Recommending comfort care and hospice.  New events last 24 hours / Subjective: Resting comfortably. Has IVF, IV morphine drip, continuous pulse ox, [place] O2 per family request.   Assessment & Plan:  Principal Problem:    Hepatocellular carcinoma metastatic to lung [facility]) Active Problems:   Hyperlipidemia   Gout, unspecified   Essential hypertension   Asthma   Anxiety and depression   Platelets decreased (HCC)   Diabetes mellitus type 2, insulin dependent ([place])   History of nephrectomy   Liver cirrhosis secondary to NASH (HCC)   Thrombocytopenia (HCC)   Hypothyroidism   Chronic hypoxemic respiratory failure (HCC)   Chronic diastolic CHF (congestive heart failure) (HCC)   Pulmonary hypertension (HCC)   Chronic renal disease, stage 4, severely decreased glomerular filtration rate (GFR) between 15-29 mL/min/1.73 square meter (HCC)   OSA (obstructive sleep apnea)   Restrictive lung disease   Intractable pain   Pathological fracture   Hypernatremia   Continue comfort care measures.  Anticipate in-hospital death.   Antimicrobials:  Anti-infectives (From admission, onward)    Start     Dose/Rate Route Frequency Ordered Stop   11/21/22 1700  metroNIDAZOLE (FLAGYL) IVPB 500 mg  Status:  Discontinued        500 mg 100 mL/hr over 60 Minutes Intravenous Every 12 hours 11/21/22 0816 11/23/22 0655   11/21/22 1600  ceFEPIme (MAXIPIME) 2 g in sodium chloride 0.9 % 100 mL IVPB  Status:  Discontinued        2 g 200 mL/hr over 30 Minutes Intravenous Every 12 hours 11/21/22 0816 11/23/22 0721   11/21/22 1000  vancomycin (VANCOCIN) IVPB 1000 mg/200 mL premix  Status:  Discontinued        1,000 mg 200 mL/hr over 60 Minutes Intravenous Every 48 hours 11/21/22 0807 11/22/22 0949   11/21/22 0900  piperacillin-tazobactam (ZOSYN) IVPB 3.375 g  Status:  Discontinued        3.375 g 12.5 mL/hr over 240 Minutes Intravenous  Every 8 hours 11/21/22 0748 11/21/22 0816   11/21/22 0815  ceFEPIme (MAXIPIME) 2 g in sodium chloride 0.9 % 100 mL IVPB  Status:  Discontinued        2 g 200 mL/hr over 30 Minutes Intravenous Every 12 hours 11/21/22 0727 11/21/22 0748   11/16/22 1000  fluconazole (DIFLUCAN) tablet 100 mg  Status:   Discontinued        100 mg Oral Daily 11/16/22 0658 11/23/22 0655   11/13/22 1000  azithromycin (ZITHROMAX) tablet 500 mg  Status:  Discontinued        500 mg Oral Daily 11/13/22 0732 11/16/22 0722   11/11/22 0800  azithromycin (ZITHROMAX) 500 mg in sodium chloride 0.9 % 250 mL IVPB  Status:  Discontinued        500 mg 250 mL/hr over 60 Minutes Intravenous Every 24 hours 11/11/22 0717 11/13/22 0732   11/04/2022 0800  meropenem (MERREM) 500 mg in sodium chloride 0.9 % 100 mL IVPB        500 mg 200 mL/hr over 30 Minutes Intravenous 2 times daily 11/11/2022 0637 11/07/22 2316   11/02/22 0800  vancomycin (VANCOCIN) IVPB 1000 mg/200 mL premix        1,000 mg 200 mL/hr over 60 Minutes Intravenous To Radiology 11/02/22 0710 12/02/2022 0800   10/25/22 0715  cefTRIAXone (ROCEPHIN) 1 g in sodium chloride 0.9 % 100 mL IVPB  Status:  Discontinued        1 g 200 mL/hr over 30 Minutes Intravenous Every 24 hours 10/25/22 0701 10/27/22 1515        Objective: Vitals:   11/24/22 2120 11/25/22 1400 11/25/22 1945 11/25/22 2102  BP: (!) 152/54 (!) 124/47  (!) 121/41  Pulse: (!) 58 63  (!) 58  Resp: 16 14    Temp: 98 F (36.7 C) 97.8 F (36.6 C)  98 F (36.7 C)  TempSrc: Oral Oral  Oral  SpO2: 96% 94% 95% 97%  Weight:      Height:        Intake/Output Summary (Last 24 hours) at 11/26/2022 1051 Last data filed at 11/26/2022 1033 Gross per 24 hour  Intake 1245.95 ml  Output 300 ml  Net 945.95 ml    Filed Weights   11/21/22 0628 11/22/22 0629 11/23/22 0423  Weight: 89.5 kg 105.2 kg 108 kg    Examination:  General exam: Appears calm and comfortable   Data Reviewed: I have personally reviewed following labs and imaging studies  CBC: Recent Labs  Lab 11/20/22 0536 11/21/22 0601 11/22/22 0904 11/23/22 0359  WBC 8.2 18.2* 12.0* 7.7  NEUTROABS  --  15.2* 10.0* 6.5  HGB 10.4* 10.8* 10.1* 9.4*  HCT 33.8* 35.4* 32.8* 31.9*  MCV 95.8 95.2 95.9 99.1  PLT 71* 66* 53* 48*    Basic Metabolic  Panel: Recent Labs  Lab 11/20/22 0536 11/21/22 0601 11/22/22 0904 11/23/22 0359  NA 140 141 142 146*  K 4.7 5.0 4.1 4.3  CL 98 98 103 107  CO2 34* 35* 32 32  GLUCOSE 153* 54* 143* 159*  BUN 75* 87* 80* 79*  CREATININE 1.26* 1.42* 1.37* 1.41*  CALCIUM 7.7* 7.8* 7.3* 7.1*    GFR: Estimated Creatinine Clearance: 34.4 mL/min (A) (by C-G formula based on SCr of 1.41 mg/dL (H)). Liver Function Tests: Recent Labs  Lab 11/20/22 0536 11/21/22 0601 11/22/22 0904 11/23/22 0359  AST 31 34 35 27  ALT _0 ALKPHOS 70 68 63 51  BILITOT  0.9 1.0 1.2 1.0  PROT 5.7* 5.4* 5.3* 4.8*  ALBUMIN 3.2* 2.7* 2.7* 2.4*    No results for input(s): "LIPASE", "AMYLASE" in the last 168 hours. No results for input(s): "AMMONIA" in the last 168 hours. Coagulation Profile: No results for input(s): "INR", "PROTIME" in the last 168 hours. Cardiac Enzymes: No results for input(s): "CKTOTAL", "CKMB", "CKMBINDEX", "TROPONINI" in the last 168 hours. BNP (last 3 results) No results for input(s): "PROBNP" in the last 8760 hours. HbA1C: No results for input(s): "HGBA1C" in the last 72 hours. CBG: Recent Labs  Lab 11/22/22 2346 11/23/22 0400 11/23/22 0750 11/24/22 1444 11/25/22 1221  GLUCAP 130* 147* 138* 163* 173*    Lipid Profile: No results for input(s): "CHOL", "HDL", "LDLCALC", "TRIG", "CHOLHDL", "LDLDIRECT" in the last 72 hours. Thyroid Function Tests: No results for input(s): "TSH", "T4TOTAL", "FREET4", "T3FREE", "THYROIDAB" in the last 72 hours. Anemia Panel: No results for input(s): "VITAMINB12", "FOLATE", "FERRITIN", "TIBC", "IRON", "RETICCTPCT" in the last 72 hours. Sepsis Labs: Recent Labs  Lab 11/21/22 0601 11/22/22 0904  PROCALCITON 2.64 2.17     Recent Results (from the past 240 hour(s))  Culture, blood (Routine X 2) w Reflex to ID Panel     Status: None   Collection Time: 11/21/22  7:33 AM   Specimen: BLOOD RIGHT ARM  Result Value Ref Range Status   Specimen  Description   Final    BLOOD RIGHT ARM Performed at [facility] [address], [place], [place] [address]    Special Requests   Final    IN PEDIATRIC BOTTLE Blood Culture adequate volume Performed at [facility] [address], [place], [place] [address]    Culture   Final    NO GROWTH 5 DAYS Performed at [facility], [place] [address] [place], [place] [address]    Report Status 11/26/2022 FINAL  Final  Culture, blood (Routine X 2) w Reflex to ID Panel     Status: None   Collection Time: 11/21/22  7:33 AM   Specimen: BLOOD RIGHT HAND  Result Value Ref Range Status   Specimen Description   Final    BLOOD RIGHT HAND Performed at [facility] [address] [place], [place] [address]    Special Requests   Final    IN PEDIATRIC BOTTLE Blood Culture adequate volume Performed at [facility] [address] [place], [place] [address]    Culture   Final    NO GROWTH 5 DAYS Performed at [facility], [place] [address], [place], [place] [address]    Report Status 11/26/2022 FINAL  Final  MRSA Next Gen by PCR, Nasal     Status: None   Collection Time: 11/21/22 11:03 AM   Specimen: Nasal Mucosa; Nasal Swab  Result Value Ref Range Status   MRSA by PCR Next Gen NOT DETECTED NOT DETECTED Final    Comment: (NOTE) The GeneXpert MRSA Assay (FDA approved for NASAL specimens only), is one component of a comprehensive MRSA colonization surveillance program. It is not intended to diagnose MRSA infection nor to guide or monitor treatment for MRSA infections. Test performance is not FDA approved in patients less than 18 years old. Performed at [facility], [place] [address] [place], [place] [address]       Radiology Studies: No results found.    Scheduled Meds:  Chlorhexidine Gluconate Cloth  6 each Topical Daily   scopolamine  1 patch Transdermal  Q72H   sodium chloride flush   10-40 mL Intracatheter Q12H   Continuous Infusions:  sodium chloride 50 mL/hr at 11/26/22 0604   morphine 1 mg/hr (11/26/22 0313)     LOS: 32 days   Time spent: 20 minutes   Dessa Phi, DO [facility] 11/26/2022, 10:51 AM   Available via Epic secure chat 7am-7pm After these hours, please refer to coverage provider listed on [email or web address]

## 2022-11-27 DIAGNOSIS — C220 Liver cell carcinoma: Secondary | ICD-10-CM | POA: Diagnosis not present

## 2022-11-27 DIAGNOSIS — C7800 Secondary malignant neoplasm of unspecified lung: Secondary | ICD-10-CM | POA: Diagnosis not present

## 2022-11-27 DIAGNOSIS — C7802 Secondary malignant neoplasm of left lung: Secondary | ICD-10-CM | POA: Diagnosis not present

## 2022-12-04 NOTE — Progress Notes (Signed)
Margaret Dyer is still pain in.  She really is not responsive to me this morning.  Her breaths are shallow.  She does sound quite congested.  I am sure that this is from the IV fluid that she is getting.  Her family wishes her to have some IV fluid.  She does not appear to be in any obvious distress.  Oxygen saturation is 92%.  She does not look like she is in any obvious pain.  When listen to her heart, it is quite slow.  He does seem to increase little bit when she takes of breath and.  Again her lungs sound quite congested bilaterally.  She does have decent air movement.  At this point, we does have to make sure that she is still comfortable.  We want to make sure that we honor the wishes of the family and of the patient.  I just he is see her exist like this.  However, she is not struggling which is nice to see.  I do appreciate the great care that she is getting from everybody upon 4 E.   Margaret Haw, MD  Tillie Fantasia

## 2022-12-04 NOTE — Progress Notes (Signed)
    OVERNIGHT PROGRESS REPORT  Notified by RN that patient has expired at 2130 hrs  Patient was DNR/CMO (comfort measures only)   2 RN verified.  Family notified by Nursing staff.     Gershon Cull MSNA ACNPC-AG Acute Care Nurse Practitioner [place]

## 2022-12-04 NOTE — Progress Notes (Signed)
PROGRESS NOTE    Margaret Dyer  EQA:[id number] DOB: 12-01-1935 DOA: 10/25/2022 PCP: Ann Held, DO     Brief Narrative:  Margaret Dyer is an 88 year old with history of DM2, HTN, HLD, diastolic CHF, pulmonary HTN, cirrhosis, CKD stage IIIa, renal cell carcinoma with left nephrectomy in 2000, hypothyroidism presented to hospital with intractable back pain currently admitted with working diagnosis of metastatic hepatic cellular carcinoma, respiratory failure.    Her CT scan showed compression fracture of L4 with innumerable bilateral pulmonary metastases, posterior left acetabular wall fracture.  MRI confirmed these findings and patient was admitted to the hospital.  Radiology and oncology were consulted. Patient was transferred to [facility] for radiation treatments.  Since being here patient is getting pain control, Chemo-Port placed on 12/8. Hospital course complicated by hyperkalemia.  Due to some respiratory distress, CT chest was done which showed signs of volume overload, some reduction in pulmonary nodules.  Her respiratory status, hydration and renal function has been very tenuous therefore off-and-on requiring IV fluids and diuretics.    12/13: US shows small volume ascites, patient continues to remain short of breath placed on scheduled Lasix iv. 12/18: Patient having ongoing rectal pain after disimpaction.  Having some mild loose stool after laxatives, continues to complain of shortness of breath. 12/19: Now more lethargic, hypoglycemic blood pressure soft and with worsening leukocytosis started on antibiotic for pneumonia coverage. CT abdomen with fecal impaction sterocolitis/urine retention.  12/21: Extensive goals of care discussions.  DNR now.  Recommending comfort care and hospice.  New events last 24 hours / Subjective: Resting comfortably. Has IVF, IV morphine drip, continuous pulse ox, Z[place] O2 per family request. No new issues overnight. Remains on comfort care  measures.   Assessment & Plan:  Principal Problem:   Hepatocellular carcinoma metastatic to lung [facility]) Active Problems:   Hyperlipidemia   Gout, unspecified   Essential hypertension   Asthma   Anxiety and depression   Platelets decreased (HCC)   Diabetes mellitus type 2, insulin dependent ([place])   History of nephrectomy   Liver cirrhosis secondary to NASH (HCC)   Thrombocytopenia (HCC)   Hypothyroidism   Chronic hypoxemic respiratory failure (HCC)   Chronic diastolic CHF (congestive heart failure) (HCC)   Pulmonary hypertension (HCC)   Chronic renal disease, stage 4, severely decreased glomerular filtration rate (GFR) between 15-29 mL/min/1.73 square meter (HCC)   OSA (obstructive sleep apnea)   Restrictive lung disease   Intractable pain   Pathological fracture   Hypernatremia   Continue comfort care measures.  Anticipate in-hospital death.   Antimicrobials:  Anti-infectives (From admission, onward)    Start     Dose/Rate Route Frequency Ordered Stop   11/21/22 1700  metroNIDAZOLE (FLAGYL) IVPB 500 mg  Status:  Discontinued        500 mg 100 mL/hr over 60 Minutes Intravenous Every 12 hours 11/21/22 0816 11/23/22 0655   11/21/22 1600  ceFEPIme (MAXIPIME) 2 g in sodium chloride 0.9 % 100 mL IVPB  Status:  Discontinued        2 g 200 mL/hr over 30 Minutes Intravenous Every 12 hours 11/21/22 0816 11/23/22 0721   11/21/22 1000  vancomycin (VANCOCIN) IVPB 1000 mg/200 mL premix  Status:  Discontinued        1,000 mg 200 mL/hr over 60 Minutes Intravenous Every 48 hours 11/21/22 0807 11/22/22 0949   11/21/22 0900  piperacillin-tazobactam (ZOSYN) IVPB 3.375 g  Status:  Discontinued  3.375 g 12.5 mL/hr over 240 Minutes Intravenous Every 8 hours 11/21/22 0748 11/21/22 0816   11/21/22 0815  ceFEPIme (MAXIPIME) 2 g in sodium chloride 0.9 % 100 mL IVPB  Status:  Discontinued        2 g 200 mL/hr over 30 Minutes Intravenous Every 12 hours 11/21/22 0727 11/21/22 0748   11/16/22  1000  fluconazole (DIFLUCAN) tablet 100 mg  Status:  Discontinued        100 mg Oral Daily 11/16/22 0658 11/23/22 0655   11/13/22 1000  azithromycin (ZITHROMAX) tablet 500 mg  Status:  Discontinued        500 mg Oral Daily 11/13/22 0732 11/16/22 0722   11/11/22 0800  azithromycin (ZITHROMAX) 500 mg in sodium chloride 0.9 % 250 mL IVPB  Status:  Discontinued        500 mg 250 mL/hr over 60 Minutes Intravenous Every 24 hours 11/11/22 0717 11/13/22 0732   11/13/2022 0800  meropenem (MERREM) 500 mg in sodium chloride 0.9 % 100 mL IVPB        500 mg 200 mL/hr over 30 Minutes Intravenous 2 times daily 11/15/2022 0637 11/07/22 2316   11/02/22 0800  vancomycin (VANCOCIN) IVPB 1000 mg/200 mL premix        1,000 mg 200 mL/hr over 60 Minutes Intravenous To Radiology 11/02/22 0710 11/18/2022 0800   10/25/22 0715  cefTRIAXone (ROCEPHIN) 1 g in sodium chloride 0.9 % 100 mL IVPB  Status:  Discontinued        1 g 200 mL/hr over 30 Minutes Intravenous Every 24 hours 10/25/22 0701 10/27/22 1515        Objective: Vitals:   11/25/22 2102 11/26/22 1447 11/26/22 2048 25-Dec-2022 0512  BP: (!) 121/41 (!) 145/77 (!) 117/46 (!) 110/44  Pulse: (!) 58 60 66 65  Resp:  _0 Temp: 98 F (36.7 C) (!) 97.5 F (36.4 C) (!) 97.5 F (36.4 C) 97.7 F (36.5 C)  TempSrc: Oral Oral Oral Oral  SpO2: 97% 95% 98% 94%  Weight:      Height:        Intake/Output Summary (Last 24 hours) at Dec 25, 2022 1155 Last data filed at 2022/12/25 0500 Gross per 24 hour  Intake 901.12 ml  Output 400 ml  Net 501.12 ml    Filed Weights   11/21/22 0628 11/22/22 0629 11/23/22 0423  Weight: 89.5 kg 105.2 kg 108 kg    Examination:  General exam: Appears calm and comfortable   Data Reviewed: I have personally reviewed following labs and imaging studies  CBC: Recent Labs  Lab 11/21/22 0601 11/22/22 0904 11/23/22 0359  WBC 18.2* 12.0* 7.7  NEUTROABS 15.2* 10.0* 6.5  HGB 10.8* 10.1* 9.4*  HCT 35.4* 32.8* 31.9*  MCV 95.2  95.9 99.1  PLT 66* 53* 48*    Basic Metabolic Panel: Recent Labs  Lab 11/21/22 0601 11/22/22 0904 11/23/22 0359  NA 141 142 146*  K 5.0 4.1 4.3  CL 98 103 107  CO2 35* 32 32  GLUCOSE 54* 143* 159*  BUN 87* 80* 79*  CREATININE 1.42* 1.37* 1.41*  CALCIUM 7.8* 7.3* 7.1*    GFR: Estimated Creatinine Clearance: 34.4 mL/min (A) (by C-G formula based on SCr of 1.41 mg/dL (H)). Liver Function Tests: Recent Labs  Lab 11/21/22 0601 11/22/22 0904 11/23/22 0359  AST 34 35 27  ALT _1 ALKPHOS 68 63 51  BILITOT 1.0 1.2 1.0  PROT 5.4* 5.3* 4.8*  ALBUMIN 2.7* 2.7*  2.4*    No results for input(s): "LIPASE", "AMYLASE" in the last 168 hours. No results for input(s): "AMMONIA" in the last 168 hours. Coagulation Profile: No results for input(s): "INR", "PROTIME" in the last 168 hours. Cardiac Enzymes: No results for input(s): "CKTOTAL", "CKMB", "CKMBINDEX", "TROPONINI" in the last 168 hours. BNP (last 3 results) No results for input(s): "PROBNP" in the last 8760 hours. HbA1C: No results for input(s): "HGBA1C" in the last 72 hours. CBG: Recent Labs  Lab 11/23/22 0400 11/23/22 0750 11/24/22 1444 11/25/22 1221 11/26/22 1756  GLUCAP 147* 138* 163* 173* 134*    Lipid Profile: No results for input(s): "CHOL", "HDL", "LDLCALC", "TRIG", "CHOLHDL", "LDLDIRECT" in the last 72 hours. Thyroid Function Tests: No results for input(s): "TSH", "T4TOTAL", "FREET4", "T3FREE", "THYROIDAB" in the last 72 hours. Anemia Panel: No results for input(s): "VITAMINB12", "FOLATE", "FERRITIN", "TIBC", "IRON", "RETICCTPCT" in the last 72 hours. Sepsis Labs: Recent Labs  Lab 11/21/22 0601 11/22/22 0904  PROCALCITON 2.64 2.17     Recent Results (from the past 240 hour(s))  Culture, blood (Routine X 2) w Reflex to ID Panel     Status: None   Collection Time: 11/21/22  7:33 AM   Specimen: BLOOD RIGHT ARM  Result Value Ref Range Status   Specimen Description   Final    BLOOD RIGHT  ARM Performed at [facility] [address], [place], [place] [address]    Special Requests   Final    IN PEDIATRIC BOTTLE Blood Culture adequate volume Performed at [facility] [address] [place], [place] [address]    Culture   Final    NO GROWTH 5 DAYS Performed at [facility], [place] [address] [place], [place] [address]    Report Status 11/26/2022 FINAL  Final  Culture, blood (Routine X 2) w Reflex to ID Panel     Status: None   Collection Time: 11/21/22  7:33 AM   Specimen: BLOOD RIGHT HAND  Result Value Ref Range Status   Specimen Description   Final    BLOOD RIGHT HAND Performed at [facility] [address] [place], [place] [address]    Special Requests   Final    IN PEDIATRIC BOTTLE Blood Culture adequate volume Performed at [facility] [address] [place], [place] [address]    Culture   Final    NO GROWTH 5 DAYS Performed at [facility], [place] [address] [place], [place] [address]    Report Status 11/26/2022 FINAL  Final  MRSA Next Gen by PCR, Nasal     Status: None   Collection Time: 11/21/22 11:03 AM   Specimen: Nasal Mucosa; Nasal Swab  Result Value Ref Range Status   MRSA by PCR Next Gen NOT DETECTED NOT DETECTED Final    Comment: (NOTE) The GeneXpert MRSA Assay (FDA approved for NASAL specimens only), is one component of a comprehensive MRSA colonization surveillance program. It is not intended to diagnose MRSA infection nor to guide or monitor treatment for MRSA infections. Test performance is not FDA approved in patients less than 83 years old. Performed at [facility], [place] [address], [place], [place] [address]       Radiology Studies: No results found.    Scheduled Meds:  Chlorhexidine Gluconate Cloth  6 each Topical Daily   scopolamine  1 patch Transdermal Q72H   sodium chloride flush  10-40 mL Intracatheter Q12H    Continuous  Infusions:  sodium chloride 50 mL/hr at 28-Nov-2022 0109   morphine 1 mg/hr (11/26/22 0313)     LOS: 33 days   Time spent: 20 minutes   Dessa Phi, DO [facility] 11-28-2022, 11:55 AM   Available via Epic secure chat 7am-7pm After these hours, please refer to coverage provider listed on [email or web address]

## 2022-12-04 NOTE — Death Summary Note (Signed)
DEATH SUMMARY   Patient Details  Name: Margaret Dyer MRN: [id number] DOB: 1935-05-16 HQI:ONGEX Chase, Margaret Apa, DO Admission/Discharge Information   Admit Date:  15-Nov-2022  Date of Death: Date of Death: 12/19/22  Time of Death: Time of Death: 30-Jan-2129  Length of Stay: 2032/01/31   Principle Cause of death: Hepatocellular carcinoma metastatic to lung   Hospital Diagnoses: Principal Problem:   Hepatocellular carcinoma metastatic to lung [facility]) Active Problems:   Hyperlipidemia   Gout, unspecified   Essential hypertension   Asthma   Anxiety and depression   Platelets decreased ([place])   Diabetes mellitus type 2, insulin dependent ([place])   History of nephrectomy   Liver cirrhosis secondary to NASH (HCC)   Thrombocytopenia (HCC)   Hypothyroidism   Chronic hypoxemic respiratory failure (HCC)   Chronic diastolic CHF (congestive heart failure) (HCC)   Pulmonary hypertension (HCC)   Chronic renal disease, stage 4, severely decreased glomerular filtration rate (GFR) between 15-29 mL/min/1.73 square meter (HCC)   OSA (obstructive sleep apnea)   Restrictive lung disease   Intractable pain   Pathological fracture   Hypernatremia   Hospital Course: Margaret Dyer is an 88 year old with history of DM2, HTN, HLD, diastolic CHF, pulmonary HTN, cirrhosis, CKD stage IIIa, renal cell carcinoma with left nephrectomy in 01/30/99, hypothyroidism presented to hospital with intractable back pain currently admitted with working diagnosis of metastatic hepatic cellular carcinoma, respiratory failure.    Her CT scan showed compression fracture of L4 with innumerable bilateral pulmonary metastases, posterior left acetabular wall fracture.  MRI confirmed these findings and patient was admitted to the hospital.  Radiology and oncology were consulted. Patient was transferred to [facility] for radiation treatments.  Since being here patient is getting pain control, Chemo-Port placed on 12/8. Hospital course complicated  by hyperkalemia.  Due to some respiratory distress, CT chest was done which showed signs of volume overload, some reduction in pulmonary nodules.  Her respiratory status, hydration and renal function has been very tenuous therefore off-and-on requiring IV fluids and diuretics.    12/13: US shows small volume ascites, patient continues to remain short of breath placed on scheduled Lasix iv. 12/18: Patient having ongoing rectal pain after disimpaction.  Having some mild loose stool after laxatives, continues to complain of shortness of breath. 12/19: Now more lethargic, hypoglycemic blood pressure soft and with worsening leukocytosis started on antibiotic for pneumonia coverage. CT abdomen with fecal impaction sterocolitis/urine retention.  12/21: Extensive goals of care discussions.  DNR now.  Recommending comfort care and hospice.        The results of significant diagnostics from this hospitalization (including imaging, microbiology, ancillary and laboratory) are listed below for reference.   Significant Diagnostic Studies: CT ABDOMEN PELVIS WO CONTRAST  Addendum Date: 11/21/2022   ADDENDUM REPORT: 11/21/2022 17:02 ADDENDUM: For clarification there was perihepatic ascites seen at the inferior margin of the chest CT in the upper abdomen. This appears to have increased since previous imaging. This is new compared to imaging from November. Imaging findings were discussed with the provider as outlined below. These results were called by telephone at the time of interpretation on 11/21/2022 at 5:02 pm to provider [facility] , who verbally acknowledged these results. Electronically Signed   By: Margaret Dyer M.D.   On: 11/21/2022 17:02   Result Date: 11/21/2022 CLINICAL DATA:  A female at age 33 presents for evaluation of abdominal pain in the setting of metastatic renal cell carcinoma and cirrhosis. EXAM: CT ABDOMEN  AND PELVIS WITHOUT CONTRAST TECHNIQUE: Multidetector CT imaging of the abdomen and  pelvis was performed following the standard protocol without IV contrast. RADIATION DOSE REDUCTION: This exam was performed according to the departmental dose-optimization program which includes automated exposure control, adjustment of the mA and/or kV according to patient size and/or use of iterative reconstruction technique. COMPARISON:  Previous imaging from October 25, 2022 and abdominal sonogram from October 28, 2022. FINDINGS: Lower chest: Incidental imaging of the lung bases with cardiomegaly, heart is incompletely imaged. Decreasing bilateral pleural effusions since previous imaging and decreasing basilar consolidative change still with small RIGHT and trace LEFT effusions and moderate consolidative process at the RIGHT greater than LEFT lung base. Pulmonary metastatic lesions are not as well delineated on current imaging as on previous imaging likely mixed with volume loss at the RIGHT lung base. No chest wall abnormality aside from bilateral flank and chest wall edema in the setting of developing anasarca. Hepatobiliary: Hepatic lesions similar to previous imaging on a background of marked hepatic cirrhosis. Lesion in the posterior RIGHT hemiliver measures as much as 5.3 x 4.4 cm perhaps even slightly larger on some images up to 5.6 cm. Small lesion in the LEFT hepatic lobe (image 15/2) this measures 8 mm. Gallbladder is distended as on previous imaging. Interval development of perihepatic and generalized abdominal ascites since prior imaging. Fluid in the abdomen shows low-density. No gross biliary duct distension. Pancreas: Pancreatic atrophy without acute process. Spleen: Splenomegaly, spleen measuring up to 14 cm greatest craniocaudal dimension. Adrenals/Urinary Tract: Post LEFT nephrectomy. Nodularity in the LEFT nephrectomy bed 2.3 cm unchanged. Stable low-density nodularity of the RIGHT adrenal gland is unchanged. There is marked renal cortical scarring on the RIGHT. There is no RIGHT-sided  hydronephrosis. Urinary bladder without focal thickening or stranding on noncontrast imaging. Moderate to marked distension of the urinary bladder may be related to some form of bladder outlet obstruction in the setting of fecal impaction. Stomach/Bowel: Moderate rectal distension with perirectal stranding and mild rectal thickening 7 cm greatest dimension. Sigmoid thickening and adjacent stranding. No signs of bowel obstruction currently. Vascular/Lymphatic: Aortic atherosclerosis. No sign of aneurysm. Smooth contour of the IVC. There is no gastrohepatic or hepatoduodenal ligament lymphadenopathy. No retroperitoneal or mesenteric lymphadenopathy. No pelvic sidewall lymphadenopathy. Reproductive: Post hysterectomy. Other: Small volume ascites developed since previous imaging. Musculoskeletal: Unchanged appearance of pathologic fracture of the L4 vertebral body approximally 20-30% loss of height of the superior endplate. No change in spinal alignment. Lucency in the L5 vertebral body. IMPRESSION: 1. Fecal impaction with stercoral proctocolitis. Given degree of sigmoid thickening would also correlate with any signs of superimposed infectious colitis such as C difficile. 2. Moderate to marked urinary bladder distension could be related to fecal impaction and mass effect upon the bladder base. Correlate with signs of bladder outlet obstruction or urinary retention. 3. Interval development of perihepatic and generalized abdominal ascites since prior imaging. May reflect findings decompensation of hepatic cirrhosis or could be related to ongoing colitis. 4. Signs of cirrhosis and portal hypertension with hepatic masses largest in the posterior RIGHT hemiliver. This could reflect metastatic disease but given the degree of hepatic cirrhosis would also consider the possibility of primary hepatic neoplasm such as hepatocellular carcinoma. AFP correlation may be helpful with tissue sampling or hepatic MRI as possible. 5.  Decreasing bilateral pleural effusions and basilar consolidative changes. 6. Unchanged appearance of pathologic fracture at L4. 7. Lucency in the L5 vertebral body is unchanged. 8. Signs of pulmonary metastatic disease not  well evaluated. 9. Aortic atherosclerosis. Aortic Atherosclerosis (ICD10-I70.0). Electronically Signed: By: Margaret Dyer M.D. On: 11/21/2022 16:05   DG Chest 2 View  Result Date: 11/20/2022 CLINICAL DATA:  Pneumonia EXAM: CHEST - 2 VIEW COMPARISON:  11/17/2022 FINDINGS: RIGHT jugular Port-A-Cath with tip projecting over SVC. Enlargement of cardiac silhouette with pulmonary vascular congestion. Atherosclerotic calcifications and mild tortuosity of thoracic aorta. Patchy BILATERAL pulmonary infiltrates, greater in upper lobes, favor multifocal pneumonia. Decreased pleural effusion since previous exam. No pneumothorax. Bones demineralized. IMPRESSION: Patchy BILATERAL pulmonary infiltrates concerning for pneumonia. Enlargement of cardiac silhouette with pulmonary vascular congestion. Aortic Atherosclerosis (ICD10-I70.0). Electronically Signed   By: Lavonia Dana M.D.   On: 11/20/2022 16:06   DG Abd 1 View  Result Date: 11/20/2022 CLINICAL DATA:  Constipation, rectal pain EXAM: ABDOMEN - 1 VIEW COMPARISON:  Portable exam 1556 hours compared to 08/15/2016 FINDINGS: Mildly prominent retained contrast and stool in rectum. Gaseous distention of colon, mild. No evidence of small bowel obstruction or wall thickening. Bones demineralized. Surgical clips LEFT mid abdomen. Osseous demineralization with prior proximal LEFT femoral ORIF. IMPRESSION: Mildly prominent stool and retained contrast in rectum. Mild diffuse gaseous distention of colon. Electronically Signed   By: Lavonia Dana M.D.   On: 11/20/2022 16:05   DG Chest 2 View  Result Date: 11/17/2022 CLINICAL DATA:  Productive cough.  History of metastatic malignancy EXAM: CHEST - 2 VIEW COMPARISON:  Chest radiograph dated 11/14/2022, CT chest  dated 11/11/2022 FINDINGS: Right chest wall port tip projects over the superior cavoatrial junction. Low lung volumes. Bibasilar patchy opacities. Multifocal pulmonary nodules are better evaluated on prior chest CT. Small bilateral pleural effusions. No pneumothorax. Similar enlarged cardiomediastinal silhouette. The visualized skeletal structures are unremarkable. Left upper quadrant surgical clips. IMPRESSION: 1. Low lung volumes with bibasilar patchy opacities, which may represent atelectasis, aspiration or pneumonia. 2. Small bilateral pleural effusions. 3. Multifocal pulmonary nodules are better evaluated on prior chest CT. Electronically Signed   By: Darrin Nipper M.D.   On: 11/17/2022 12:07   [place] ASCITES (ABDOMEN LIMITED)  Result Date: 11/16/2022 CLINICAL DATA:  Decompensated hepatic cirrhosis EXAM: LIMITED ABDOMEN ULTRASOUND FOR ASCITES TECHNIQUE: Limited ultrasound survey for ascites was performed in all four abdominal quadrants. COMPARISON:  None Available. FINDINGS: Small volume ascites in the right upper quadrant. Partially visualized liver demonstrates nodular contours consistent with history of cirrhosis. IMPRESSION: Small volume ascites in the right upper quadrant. Electronically Signed   By: Maurine Simmering M.D.   On: 11/16/2022 09:30   ECHOCARDIOGRAM COMPLETE  Result Date: 11/15/2022    ECHOCARDIOGRAM REPORT   Patient Name:   ASHWIKA FREELS Date of Exam: 11/15/2022 Medical Rec #:  [id number]     Height:       63.0 in Accession #:    [id number]    Weight:       212.3 lb Date of Birth:  09-09-1935     BSA:          1.983 m Patient Age:    16 years      BP:           134/46 mmHg Patient Gender: F             HR:           60 bpm. Exam Location:  Inpatient Procedure: 2D Echo, Color Doppler and Cardiac Doppler Indications:    Chemo  History:        Patient has prior history of Echocardiogram examinations, most  recent 05/26/2021. CHF; Risk Factors:Hypertension, Diabetes,                  Dyslipidemia, Sleep Apnea and Lung mets.  Sonographer:    Raquel Sarna Senior RDCS Referring Phys: [place] Comments: Poor apical window due to body habitus/lung interference, wall motion obtained from short axis. IMPRESSIONS  1. Left ventricular ejection fraction, by estimation, is 60 to 65%. The left ventricle has normal function. The left ventricle has no regional wall motion abnormalities. Left ventricular diastolic parameters are consistent with Grade III diastolic dysfunction (restrictive). Elevated left ventricular end-diastolic pressure. The E/e' is 33.5.  2. Right ventricular systolic function is normal. The right ventricular size is normal. There is moderately elevated pulmonary artery systolic pressure. The estimated right ventricular systolic pressure is 63.8 mmHg.  3. Left atrial size was mild to moderately dilated.  4. A small pericardial effusion is present. The pericardial effusion is circumferential.  5. The mitral valve is grossly normal. Mild mitral valve regurgitation. No evidence of mitral stenosis.  6. The aortic valve is tricuspid. Aortic valve regurgitation is not visualized. No aortic stenosis is present.  7. The inferior vena cava is dilated in size with <50% respiratory variability, suggesting right atrial pressure of 15 mmHg. Comparison(s): Changes from prior study are noted. EF unchanged. Grade 3 DD. Elevated pulmonary pressures. FINDINGS  Left Ventricle: Left ventricular ejection fraction, by estimation, is 60 to 65%. The left ventricle has normal function. The left ventricle has no regional wall motion abnormalities. The left ventricular internal cavity size was normal in size. There is  no left ventricular hypertrophy. Left ventricular diastolic parameters are consistent with Grade III diastolic dysfunction (restrictive). Elevated left ventricular end-diastolic pressure. The E/e' is 33.5. Right Ventricle: The right ventricular size is normal. No increase in right  ventricular wall thickness. Right ventricular systolic function is normal. There is moderately elevated pulmonary artery systolic pressure. The tricuspid regurgitant velocity is 2.74 m/s, and with an assumed right atrial pressure of 15 mmHg, the estimated right ventricular systolic pressure is 46.6 mmHg. Left Atrium: Left atrial size was mild to moderately dilated. Right Atrium: Right atrial size was normal in size. Pericardium: A small pericardial effusion is present. The pericardial effusion is circumferential. Presence of epicardial fat layer. Mitral Valve: The mitral valve is grossly normal. Mild mitral valve regurgitation. No evidence of mitral valve stenosis. Tricuspid Valve: The tricuspid valve is grossly normal. Tricuspid valve regurgitation is mild . No evidence of tricuspid stenosis. Aortic Valve: The aortic valve is tricuspid. Aortic valve regurgitation is not visualized. No aortic stenosis is present. Pulmonic Valve: The pulmonic valve was grossly normal. Pulmonic valve regurgitation is mild. No evidence of pulmonic stenosis. Aorta: The aortic root and ascending aorta are structurally normal, with no evidence of dilitation. Venous: The inferior vena cava is dilated in size with less than 50% respiratory variability, suggesting right atrial pressure of 15 mmHg. IAS/Shunts: The atrial septum is grossly normal.  LEFT VENTRICLE PLAX 2D LVIDd:         4.90 cm   Diastology LVIDs:         2.70 cm   LV e' medial:    3.70 cm/s LV PW:         1.20 cm   LV E/e' medial:  33.5 LV IVS:        1.00 cm   LV e' lateral:   5.11 cm/s LVOT diam:     1.90 cm   LV  E/e' lateral: 24.3 LV SV:         75 LV SV Index:   38 LVOT Area:     2.84 cm  RIGHT VENTRICLE RV S prime:     13.30 cm/s TAPSE (M-mode): 2.1 cm LEFT ATRIUM              Index        RIGHT ATRIUM           Index LA diam:        3.90 cm  1.97 cm/m   RA Area:     18.10 cm LA Vol (A2C):   102.0 ml 51.43 ml/m  RA Volume:   45.00 ml  22.69 ml/m LA Vol (A4C):   74.4  ml  37.51 ml/m LA Biplane Vol: 91.1 ml  45.93 ml/m  AORTIC VALVE LVOT Vmax:   112.00 cm/s LVOT Vmean:  77.100 cm/s LVOT VTI:    0.265 m  AORTA Ao Root diam: 3.30 cm Ao Asc diam:  3.50 cm MITRAL VALVE                TRICUSPID VALVE MV Area (PHT): 4.04 cm     TR Peak grad:   30.0 mmHg MV Decel Time: 188 msec     TR Vmax:        274.00 cm/s MV E velocity: 124.00 cm/s MV A velocity: 56.70 cm/s   SHUNTS MV E/A ratio:  2.19         Systemic VTI:  0.26 m                             Systemic Diam: 1.90 cm Eleonore Chiquito MD Electronically signed by Eleonore Chiquito MD Signature Date/Time: 11/15/2022/9:51:01 AM    Final    DG Chest 2 View  Result Date: 11/14/2022 CLINICAL DATA:  Pneumonia EXAM: CHEST - 2 VIEW COMPARISON:  11/10/2022 FINDINGS: Frontal and lateral views of the chest demonstrate stable right chest wall port. Stable bilateral pulmonary nodules are seen, consistent with known pulmonary metastases. There is progressive multifocal bilateral airspace disease, greatest at the left lung base. Bilateral pleural effusions. No pneumothorax. Stable cardiac silhouette. No acute bony abnormalities. IMPRESSION: 1. Progressive multifocal bilateral airspace disease with bilateral pleural effusions. Findings could reflect worsening edema or infection. 2. Numerous pulmonary nodules, better seen on recent CT, consistent with known metastatic renal cell carcinoma. Electronically Signed   By: Randa Ngo M.D.   On: 11/14/2022 15:13   CT CHEST WO CONTRAST  Result Date: 11/11/2022 CLINICAL DATA:  Multiple lung nodules, pneumonia versus edema versus progressive cancer, metastatic renal cell carcinoma * Tracking Code: BO * EXAM: CT CHEST WITHOUT CONTRAST TECHNIQUE: Multidetector CT imaging of the chest was performed following the standard protocol without IV contrast. RADIATION DOSE REDUCTION: This exam was performed according to the departmental dose-optimization program which includes automated exposure control, adjustment  of the mA and/or kV according to patient size and/or use of iterative reconstruction technique. COMPARISON:  10/25/2022 FINDINGS: Cardiovascular: Right chest port catheter. Aortic atherosclerosis. Cardiomegaly. Three-vessel coronary artery calcifications. No pericardial effusion. Mediastinum/Nodes: No enlarged mediastinal, hilar, or axillary lymph nodes. Thyroid gland, trachea, and esophagus demonstrate no significant findings. Lungs/Pleura: Moderate bilateral pleural effusions and associated atelectasis or consolidation, worsened compared to prior examination. Numerous bilateral pulmonary nodules are diminished in size compared to prior examination, nodule of the anterior right upper lobe measuring 1.7 x 1.4 cm, previously 2.9 x 2.1 cm (  series 7, image 43). Additional index nodule of the posterior left upper lobe measures 2.4 x 2.0 cm, previously 4.1 x 3.3 cm (series 7, image 28). Upper Abdomen: New, small volume ascites in the included upper abdomen. Cirrhosis. Partially imaged left nephrectomy site. Musculoskeletal: No chest wall abnormality. No acute osseous findings. IMPRESSION: 1. Moderate bilateral pleural effusions and associated atelectasis or consolidation, worsened compared to prior examination. 2. Numerous bilateral pulmonary nodules are diminished in size compared to prior examination, consistent with treatment response. 3. New, small volume ascites in the included upper abdomen. 4. Cirrhosis. 5. Cardiomegaly and coronary artery disease. Aortic Atherosclerosis (ICD10-I70.0). Electronically Signed   By: Delanna Ahmadi M.D.   On: 11/11/2022 12:36   IR IMAGING GUIDED PORT INSERTION  Result Date: 11/10/2022 INDICATION: Metastatic hepatocellular carcinoma EXAM: IMPLANTED PORT A CATH PLACEMENT WITH ULTRASOUND AND FLUOROSCOPIC GUIDANCE MEDICATIONS: None ANESTHESIA/SEDATION: Moderate (conscious) sedation was employed during this procedure. A total of Versed 0.5 mg and Fentanyl 50 mcg was administered  intravenously by the radiology nurse. Total intra-service moderate Sedation Time: 27 minutes. The patient's level of consciousness and vital signs were monitored continuously by radiology nursing throughout the procedure under my direct supervision. FLUOROSCOPY: Radiation Exposure Index (as provided by the fluoroscopic device): 3 mGy Kerma COMPLICATIONS: None immediate. PROCEDURE: The procedure, risks, benefits, and alternatives were explained to the patient. Questions regarding the procedure were encouraged and answered. The patient understands and consents to the procedure. A timeout was performed prior to the initiation of the procedure. Patient positioned supine on the angiography table. Right neck and anterior upper chest prepped and draped in the usual sterile fashion. All elements of maximal sterile barrier were utilized including, cap, mask, sterile gown, sterile gloves, large sterile drape, hand scrubbing and 2% Chlorhexidine for skin cleaning. The right internal jugular vein was evaluated with ultrasound and shown to be patent. A permanent ultrasound image was obtained and placed in the patient's medical record. Local anesthesia was provided with 1% lidocaine with epinephrine. Using sterile gel and a sterile probe cover, the right internal jugular vein was entered with a 21 ga needle during real time ultrasound guidance. 0.018 inch guidewire placed and 21 ga needle exchanged for transitional dilator set. Utilizing fluoroscopy, 0.035 inch guidewire advanced centrally without difficulty. Attention then turned to the right anterior upper chest. Following local lidocaine administration, a port pocket was created. The catheter was connected to the port and brought from the pocket to the venotomy site through a subcutaneous tunnel. The catheter was cut to size and inserted through the peel-away sheath. The catheter tip was positioned at the cavoatrial junction using fluoroscopic guidance. The port aspirated and  flushed well. The port pocket was closed with deep and superficial absorbable suture. The port pocket incision and venotomy sites were also sealed with Dermabond. IMPRESSION: Successful placement of a right internal jugular approach power injectable Port-A-Cath. The catheter is ready for immediate use. Electronically Signed   By: Miachel Roux M.D.   On: 11/10/2022 13:18   DG Chest Port 1 View  Result Date: 11/10/2022 CLINICAL DATA:  Shortness of breath. EXAM: PORTABLE CHEST 1 VIEW COMPARISON:  Chest radiograph 11/07/2022 FINDINGS: Port-A-Cath tip projects over the superior vena cava. Stable cardiomegaly. Aortic atherosclerosis. Increased retrocardiac consolidation. Additional patchy bilateral mid and upper lung airspace opacities. Possible small left pleural effusion. No pneumothorax. IMPRESSION: 1. Increased retrocardiac consolidation may represent pneumonia in the appropriate clinical setting. Additional bilateral mid and upper lung airspace opacities may represent associated multifocal infection. 2.  Possible small left pleural effusion. 3. Cardiomegaly. Electronically Signed   By: Lovey Newcomer M.D.   On: 11/10/2022 11:29   DG Chest 2 View  Result Date: 11/07/2022 CLINICAL DATA:  Follow-up pneumonia EXAM: CHEST - 2 VIEW COMPARISON:  Chest x-ray 11/05/2022 FINDINGS: Heart is enlarged. There is no focal lung infiltrate, pleural effusion or pneumothorax. There is linear atelectasis in the left mid lung. There is calcified atherosclerotic disease throughout the aorta. No acute fractures are seen. IMPRESSION: 1. No active cardiopulmonary disease. 2. Cardiomegaly. Electronically Signed   By: Ronney Asters M.D.   On: 11/07/2022 15:26   DG Chest Port 1 View  Result Date: 11/05/2022 CLINICAL DATA:  Shortness of breath. EXAM: PORTABLE CHEST 1 VIEW COMPARISON:  November 04, 2022 FINDINGS: The cardiac silhouette is enlarged and unchanged in size. There is marked severity calcification of the aortic arch. Patchy,  multifocal airspace disease is again seen throughout both lungs, with stable atelectatic changes seen within the mid left lung. There is no evidence of a pleural effusion or pneumothorax. Multilevel degenerative changes seen throughout the thoracic spine. IMPRESSION: 1. Cardiomegaly with stable bilateral, multifocal airspace disease. Electronically Signed   By: Virgina Norfolk M.D.   On: 11/05/2022 02:48   DG Chest 2 View  Result Date: 11/04/2022 CLINICAL DATA:  Pneumonia.  Currently on antibiotics. EXAM: CHEST - 2 VIEW COMPARISON:  November 02, 2022 FINDINGS: The heart size and mediastinal contours are stable. The heart size is enlarged. Patchy consolidation noted throughout both lungs, improved in the right mid and lung base. Small bilateral pleural effusions are noted. The visualized skeletal structures are stable. IMPRESSION: Patchy consolidation noted throughout both lungs, improved in the right mid and lung base. Electronically Signed   By: Abelardo Diesel M.D.   On: 11/04/2022 10:31   DG CHEST PORT 1 VIEW  Result Date: 11/02/2022 CLINICAL DATA:  Pleural effusion EXAM: PORTABLE CHEST 1 VIEW COMPARISON:  11/01/2022 FINDINGS: There is developing consolidation throughout the right lung, likely infectious or inflammatory in the acute setting. Innumerable pulmonary masses are again identified though are partially obscured within the right lung by developing pulmonary infiltrate. No pneumothorax or pleural effusion. Cardiac size is mildly enlarged, unchanged. Central pulmonary arteries are enlarged, unchanged. No acute bone abnormality. IMPRESSION: 1. Developing consolidation throughout the right lung, likely infectious or inflammatory in the acute setting. 2. Innumerable pulmonary metastases again identified. Electronically Signed   By: Fidela Salisbury M.D.   On: 11/02/2022 13:45   DG Chest 2 View  Result Date: 11/01/2022 CLINICAL DATA:  Shortness of breath. EXAM: CHEST - 2 VIEW COMPARISON:  Chest CT  10/25/2022. FINDINGS: Prominent cardiopericardial silhouette is unchanged. Stable tortuosity and calcification of the thoracic aorta. Diffuse pulmonary metastatic disease again demonstrated. Enlarging bilateral pleural effusions and overlying atelectasis. No pulmonary infiltrates. IMPRESSION: 1. Diffuse pulmonary metastatic disease. 2. Enlarging bilateral pleural effusions and overlying atelectasis. Electronically Signed   By: Marijo Sanes M.D.   On: 11/01/2022 08:52   US BIOPSY (LIVER)  Result Date: 10/30/2022 INDICATION: Multiple lung nodules with liver lesion.  No diagnosis. EXAM: ULTRASOUND GUIDED LIVER LESION BIOPSY COMPARISON:  CT AP 10/25/2022.  Limited US Abdomen, 10/28/2022. MEDICATIONS: None ANESTHESIA/SEDATION: Moderate (conscious) sedation was employed during this procedure. A total of Versed 1.5 mg and Fentanyl 50 mcg was administered intravenously. Moderate Sedation Time: 39 minutes. The patient's level of consciousness and vital signs were monitored continuously by radiology nursing throughout the procedure under my direct supervision. COMPLICATIONS: None immediate. PROCEDURE: Informed  written consent was obtained from the patient after a discussion of the risks, benefits and alternatives to treatment. The patient understands and consents the procedure. A timeout was performed prior to the initiation of the procedure. Ultrasound scanning was performed of the right upper abdominal quadrant demonstrates RIGHT hepatic lobe mass The RIGHT hepatic lobe mass was selected for biopsy and the procedure was planned. The right upper abdominal quadrant was prepped and draped in the usual sterile fashion. The overlying soft tissues were anesthetized with 1% lidocaine with epinephrine. A 17 gauge, 6.8 cm co-axial needle was advanced into a peripheral aspect of the lesion. This was followed by 3 core biopsies with an 18 gauge core device under direct ultrasound guidance. The coaxial needle tract was embolized  with a small amount of Gel-Foam slurry and superficial hemostasis was obtained with manual compression. Post procedural scanning was negative for definitive area of hemorrhage or additional complication. A dressing was placed. The patient tolerated the procedure well without immediate post procedural complication. IMPRESSION: Successful ultrasound guided core needle biopsy of RIGHT hepatic lobe mass. PLAN: Extremely challenging liver lesion Bx, secondary to liver echogenicity. If sample inadequate, then consider CT-guided Bx for additional tissue. Michaelle Birks, MD [facility] Rad[facility] Electronically Signed   By: Michaelle Birks M.D.   On: 10/30/2022 17:42    Microbiology: Recent Results (from the past 240 hour(s))  Culture, blood (Routine X 2) w Reflex to ID Panel     Status: None   Collection Time: 11/21/22  7:33 AM   Specimen: BLOOD RIGHT ARM  Result Value Ref Range Status   Specimen Description   Final    BLOOD RIGHT ARM Performed at [facility] [address], [place], [place] [address]    Special Requests   Final    IN PEDIATRIC BOTTLE Blood Culture adequate volume Performed at [facility] [address], [place], [place] [address]    Culture   Final    NO GROWTH 5 DAYS Performed at [facility], [place] [address] [place], [place] [address]    Report Status 11/26/2022 FINAL  Final  Culture, blood (Routine X 2) w Reflex to ID Panel     Status: None   Collection Time: 11/21/22  7:33 AM   Specimen: BLOOD RIGHT HAND  Result Value Ref Range Status   Specimen Description   Final    BLOOD RIGHT HAND Performed at [facility] [address], [place], [place] [address]    Special Requests   Final    IN PEDIATRIC BOTTLE Blood Culture adequate volume Performed at [facility] [address] [place], [place] [address]    Culture   Final    NO GROWTH 5  DAYS Performed at [facility], [place] [address], [place],  [address]    Report Status 11/26/2022 FINAL  Final  MRSA Next Gen by PCR, Nasal     Status: None   Collection Time: 11/21/22 11:03 AM   Specimen: Nasal Mucosa; Nasal Swab  Result Value Ref Range Status   MRSA by PCR Next Gen NOT DETECTED NOT DETECTED Final    Comment: (NOTE) The GeneXpert MRSA Assay (FDA approved for NASAL specimens only), is one component of a comprehensive MRSA colonization surveillance program. It is not intended to diagnose MRSA infection nor to guide or monitor treatment for MRSA infections. Test performance is not FDA approved in patients less than 2 years  old. Performed at [facility], [place] [address] [place], [place] [address]      Signed: Dessa Phi, DO 12-26-22

## 2022-12-04 DEATH — deceased

## 2022-12-25 ENCOUNTER — Telehealth: Payer: Self-pay

## 2022-12-25 ENCOUNTER — Ambulatory Visit: Payer: Self-pay | Admitting: Radiation Oncology

## 2022-12-25 NOTE — Telephone Encounter (Signed)
Received VM from pt's daughter [name] wishing to let Dr Marin Olp know that pt's memorial service will be this Saturday 12/30/2022 at 2p at [facility], [address], [place]. Dr Marin Olp aware. dph

## 2023-01-03 ENCOUNTER — Encounter: Payer: Self-pay | Admitting: Family Medicine
